# Patient Record
Sex: Male | Born: 1950 | Race: White | Hispanic: No | Smoking: Never smoker
Health system: Southern US, Community
[De-identification: ages and names within clinical notes are randomized; demographics above are authoritative.]

## PROBLEM LIST (undated history)

## (undated) DIAGNOSIS — E13610 Other specified diabetes mellitus with diabetic neuropathic arthropathy: Secondary | ICD-10-CM

## (undated) DIAGNOSIS — Z9889 Other specified postprocedural states: Secondary | ICD-10-CM

## (undated) DIAGNOSIS — E119 Type 2 diabetes mellitus without complications: Secondary | ICD-10-CM

## (undated) DIAGNOSIS — F419 Anxiety disorder, unspecified: Secondary | ICD-10-CM

## (undated) DIAGNOSIS — R112 Nausea with vomiting, unspecified: Secondary | ICD-10-CM

## (undated) DIAGNOSIS — R0602 Shortness of breath: Secondary | ICD-10-CM

## (undated) DIAGNOSIS — I739 Peripheral vascular disease, unspecified: Secondary | ICD-10-CM

## (undated) DIAGNOSIS — I10 Essential (primary) hypertension: Secondary | ICD-10-CM

## (undated) DIAGNOSIS — K219 Gastro-esophageal reflux disease without esophagitis: Secondary | ICD-10-CM

## (undated) DIAGNOSIS — E785 Hyperlipidemia, unspecified: Secondary | ICD-10-CM

## (undated) DIAGNOSIS — I219 Acute myocardial infarction, unspecified: Secondary | ICD-10-CM

## (undated) DIAGNOSIS — K3184 Gastroparesis: Secondary | ICD-10-CM

## (undated) DIAGNOSIS — M1460 Charcot's joint, unspecified site: Secondary | ICD-10-CM

## (undated) DIAGNOSIS — F329 Major depressive disorder, single episode, unspecified: Secondary | ICD-10-CM

## (undated) DIAGNOSIS — F32A Depression, unspecified: Secondary | ICD-10-CM

## (undated) DIAGNOSIS — M1990 Unspecified osteoarthritis, unspecified site: Secondary | ICD-10-CM

## (undated) DIAGNOSIS — Z8719 Personal history of other diseases of the digestive system: Secondary | ICD-10-CM

## (undated) DIAGNOSIS — I2699 Other pulmonary embolism without acute cor pulmonale: Secondary | ICD-10-CM

## (undated) DIAGNOSIS — I2510 Atherosclerotic heart disease of native coronary artery without angina pectoris: Secondary | ICD-10-CM

## (undated) DIAGNOSIS — G629 Polyneuropathy, unspecified: Secondary | ICD-10-CM

## (undated) DIAGNOSIS — G4733 Obstructive sleep apnea (adult) (pediatric): Secondary | ICD-10-CM

## (undated) HISTORY — PX: IM NAILING TIBIA: SUR734

## (undated) HISTORY — PX: TOE AMPUTATION: SHX809

## (undated) HISTORY — PX: I & D EXTREMITY: SHX5045

## (undated) HISTORY — PX: SHOULDER ARTHROSCOPY W/ ROTATOR CUFF REPAIR: SHX2400

## (undated) NOTE — *Deleted (*Deleted)
[facility] - Clinic Note  03/30/2020     CHIEF COMPLAINT Patient presents for No chief complaint on file.   HISTORY OF PRESENT ILLNESS: Samuel [name] is a 28 y.o. male who presents to the clinic today for:     Referring physician: [name], MD [address] [place],  [place] [address]  HISTORICAL INFORMATION:   Selected notes from the MEDICAL RECORD NUMBER Referred by Dr. Nedra Hai:  Ocular Hx- PMH-    CURRENT MEDICATIONS: No current outpatient medications on file. (Ophthalmic Drugs)   No current facility-administered medications for this visit. (Ophthalmic Drugs)   Current Outpatient Medications (Other)  Medication Sig  . alum & mag hydroxide-simeth (MAALOX/MYLANTA) 200-200-20 MG/5ML suspension Take 15 mLs by mouth every 6 (six) hours as needed for indigestion or heartburn. (Patient not taking: Reported on 01/16/2020)  . amLODipine (NORVASC) 10 MG tablet Take 1 tablet (10 mg total) by mouth daily.  [name] apixaban (ELIQUIS) 5 MG TABS tablet Take 5 mg by mouth 2 (two) times daily.  [name] aspirin 81 MG EC tablet Take 1 tablet (81 mg total) by mouth daily.  [name] atorvastatin (LIPITOR) 80 MG tablet Take 1 tablet (80 mg total) by mouth daily at 6 PM. (Patient taking differently: Take 80 mg by mouth daily. )  . clopidogrel (PLAVIX) 75 MG tablet Take 75 mg by mouth daily.  [name] docusate sodium (COLACE) 100 MG capsule Take 100 mg by mouth daily.  . furosemide (LASIX) 20 MG tablet Take 1 tablet (20 mg total) by mouth daily.  [name] gabapentin (NEURONTIN) 300 MG capsule Take 600 mg by mouth 2 (two) times daily.   . Insulin Glargine (BASAGLAR KWIKPEN) 100 UNIT/ML Inject 0.55 mLs (55 Units total) into the skin 2 (two) times daily.  . insulin lispro (HUMALOG) 100 UNIT/ML injection Inject 5-18 Units into the skin See admin instructions. units three times daily before meals Sliding Scale 150-250 = 5 units 251-350= 6-8 units 351-450= 10 units Over 450 18 units  . metFORMIN  (GLUCOPHAGE-XR) 500 MG 24 hr tablet Take 500 mg by mouth daily.  . metoprolol tartrate (LOPRESSOR) 25 MG tablet Take 1 tablet (25 mg total) by mouth 2 (two) times daily.  . nitroGLYCERIN (NITROSTAT) 0.4 MG SL tablet Place 1 tablet (0.4 mg total) under the tongue every 5 (five) minutes as needed for chest pain.  Letta Pate VERIO test strip Use to test blood sugar twice daily  . pantoprazole (PROTONIX) 40 MG tablet Take 1 tablet (40 mg total) by mouth 2 (two) times daily before a meal.  . promethazine (PHENERGAN) 25 MG tablet Take 25-50 mg by mouth every 6 (six) hours as needed for nausea or vomiting.   . ranolazine (RANEXA) 500 MG 12 hr tablet Take 1 tablet (500 mg total) by mouth 2 (two) times daily.  . traMADol (ULTRAM) 50 MG tablet Take 100 mg by mouth every 6 (six) hours as needed for moderate pain.    No current facility-administered medications for this visit. (Other)      REVIEW OF SYSTEMS:    ALLERGIES Allergies  Allergen Reactions  . Gabapentin Hives, Itching, Nausea And Vomiting and Rash    REACTION: rash/hives (per patient, was a reaction to an inactive ingredient in another mgf brand). The patient stated that he does take this medication now and it doesn't cause a rash any more.  . Relafen [Nabumetone] Nausea And Vomiting and Rash  . Codeine Nausea And Vomiting and Rash  PAST MEDICAL HISTORY Past Medical History:  Diagnosis Date  . Anxiety   . Arthritis    "all over"   . CAD (coronary artery disease)   . Charcot's joint    "left foot"  . 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 Depression   . Gastroparesis   . GERD (gastroesophageal reflux disease)   . H/O hiatal hernia   . Hyperlipidemia   . Hypertension   . Myocardial infarction (HCC) 2017/03/27   around this date  . OSA (obstructive sleep apnea)    "not bad enough for a mask"  . Peripheral neuropathy   . Peripheral vascular disease (HCC)   . PONV (postoperative nausea and vomiting)    . Pulmonary embolism (HCC)    hx. of 2012  . Shortness of breath    exertion  . Type II diabetes mellitus (HCC)    Past Surgical History:  Procedure Laterality Date  . AMPUTATION Left 03/08/2015   Procedure:  LEFT AMPUTATION BELOW KNEE;  Surgeon: Toni Arthurs, MD;  Location: [facility];  Service: Orthopedics;  Laterality: Left;  . APPLICATION OF WOUND VAC Left 01/07/2014  . CHOLECYSTECTOMY  06/2010  . CORONARY ANGIOPLASTY WITH STENT PLACEMENT  05/2009   "1"  . FOOT SURGERY Left 2010   "for Charcot's joint"  . HARDWARE REMOVAL Left 01/07/2014   Procedure: LEFT LEG REMOVAL OF DEEP IMPLANT AND SEQUESTRECTOMY; APPLICATION OF WOUND VAC ;  Surgeon: Toni Arthurs, MD;  Location: [facility] OR;  Service: Orthopedics;  Laterality: Left;  . I & D EXTREMITY Left "multiple"   leg  . 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 IM NAILING TIBIA Left ~ 2012  . LEFT HEART CATH AND CORONARY ANGIOGRAPHY N/A 04/08/2017   Procedure: LEFT HEART CATH AND CORONARY ANGIOGRAPHY;  Surgeon: Runell Gess, MD;  Location: [facility] INVASIVE CV LAB;  Service: Cardiovascular;  Laterality: N/A;  . SHOULDER ARTHROSCOPY W/ ROTATOR CUFF REPAIR Left    "and bone spurs"  . TIBIAL IM ROD REMOVAL Left 01/07/2014  . TOE AMPUTATION Right ~ 2011   "great toe"  . VENA CAVA FILTER PLACEMENT  2012  . VENA CAVA FILTER PLACEMENT N/A 11/20/2016   Procedure: INSERTION VENA-CAVA FILTER;  Surgeon: Sherren Kerns, MD;  Location: [facility];  Service: Vascular;  Laterality: N/A;  . WOUND DEBRIDEMENT Left 01/07/2014   "tibia"    FAMILY HISTORY Family History  Problem Relation Age of Onset  . COPD Mother   . Heart disease Mother   . Lung cancer Mother   . Retinal detachment Father   . Alcoholism Brother   . Heart disease Brother   . COPD Brother   . Diabetes Brother   . Hypertension Brother   . Stroke Brother      SOCIAL HISTORY Social History   Tobacco Use  . Smoking status: Never Smoker  . Smokeless tobacco: Never Used  Vaping Use  . Vaping Use: Never used  Substance Use Topics  . Alcohol use: No    Comment: 01/07/2014 'might have a wine cooler a couple times/yr"  . Drug use: No         OPHTHALMIC EXAM: Not recorded     IMAGING AND PROCEDURES  Imaging and Procedures for 03/30/2020           ASSESSMENT/PLAN:  No diagnosis found.  1.  2.  3.  Ophthalmic Meds Ordered this visit:  No orders of the defined types were placed in this encounter.      No follow-ups on file.  There are no Patient Instructions on file for this visit.   Explained the diagnoses, plan, and follow up with the patient and they expressed understanding.  Patient expressed understanding of the importance of proper follow up care.   This document serves as a record of services personally performed by Karie Chimera, MD, PhD. It was created on their behalf by Annalee Genta, COMT. The creation of this record is the provider's dictation and/or activities during the visit.  Electronically signed by: Annalee Genta, COMT 03/29/20 1:16 PM    Karie Chimera, M.D., Ph.D. Diseases & Surgery of the Retina and Vitreous [facility] @TODAY @     Abbreviations: M myopia (nearsighted); A astigmatism; H hyperopia (farsighted); P presbyopia; Mrx spectacle prescription;  CTL contact lenses; OD right eye; OS left eye; OU both eyes  XT exotropia; ET esotropia; PEK punctate epithelial keratitis; PEE punctate epithelial erosions; DES dry eye syndrome; MGD meibomian gland dysfunction; ATs artificial tears; PFAT's preservative free artificial tears; NSC nuclear sclerotic cataract; PSC posterior subcapsular cataract; ERM epi-retinal membrane; PVD posterior vitreous detachment; RD retinal detachment; DM diabetes mellitus; DR diabetic retinopathy; NPDR non-proliferative diabetic retinopathy;  PDR proliferative diabetic retinopathy; CSME clinically significant macular edema; DME diabetic macular edema; dbh dot blot hemorrhages; CWS cotton wool spot; POAG primary open angle glaucoma; C/D cup-to-disc ratio; HVF humphrey visual field; GVF goldmann visual field; OCT optical coherence tomography; IOP intraocular pressure; BRVO Branch retinal vein occlusion; CRVO central retinal vein occlusion; CRAO central retinal artery occlusion; BRAO branch retinal artery occlusion; RT retinal tear; SB scleral buckle; PPV pars plana vitrectomy; VH Vitreous hemorrhage; PRP panretinal laser photocoagulation; IVK intravitreal kenalog; VMT vitreomacular traction; MH Macular hole;  NVD neovascularization of the disc; NVE neovascularization elsewhere; AREDS age related eye disease study; ARMD age related macular degeneration; POAG primary open angle glaucoma; EBMD epithelial/anterior basement membrane dystrophy; ACIOL anterior chamber intraocular lens; IOL intraocular lens; PCIOL posterior chamber intraocular lens; Phaco/IOL phacoemulsification with intraocular lens placement; PRK photorefractive keratectomy; LASIK laser assisted in situ keratomileusis; HTN hypertension; DM diabetes mellitus; COPD chronic obstructive pulmonary disease

---

## 1998-03-07 ENCOUNTER — Ambulatory Visit (HOSPITAL_COMMUNITY): Admission: RE | Admit: 1998-03-07 | Discharge: 1998-03-07 | Payer: Self-pay | Admitting: Gastroenterology

## 1999-12-01 ENCOUNTER — Encounter: Payer: Self-pay | Admitting: Gastroenterology

## 1999-12-01 ENCOUNTER — Encounter: Admission: RE | Admit: 1999-12-01 | Discharge: 1999-12-01 | Payer: Self-pay | Admitting: Gastroenterology

## 2000-01-10 ENCOUNTER — Emergency Department (HOSPITAL_COMMUNITY): Admission: EM | Admit: 2000-01-10 | Discharge: 2000-01-10 | Payer: Self-pay | Admitting: Emergency Medicine

## 2000-05-22 ENCOUNTER — Emergency Department (HOSPITAL_COMMUNITY): Admission: EM | Admit: 2000-05-22 | Discharge: 2000-05-22 | Payer: Self-pay | Admitting: Emergency Medicine

## 2000-05-23 ENCOUNTER — Encounter: Payer: Self-pay | Admitting: Emergency Medicine

## 2001-01-24 ENCOUNTER — Ambulatory Visit (HOSPITAL_BASED_OUTPATIENT_CLINIC_OR_DEPARTMENT_OTHER): Admission: RE | Admit: 2001-01-24 | Discharge: 2001-01-24 | Payer: Self-pay | Admitting: Internal Medicine

## 2001-03-28 ENCOUNTER — Ambulatory Visit: Admission: RE | Admit: 2001-03-28 | Discharge: 2001-03-28 | Payer: Self-pay | Admitting: Internal Medicine

## 2002-06-16 ENCOUNTER — Encounter: Payer: Self-pay | Admitting: Cardiovascular Disease

## 2002-06-16 ENCOUNTER — Inpatient Hospital Stay (HOSPITAL_COMMUNITY): Admission: EM | Admit: 2002-06-16 | Discharge: 2002-06-17 | Payer: Self-pay | Admitting: Emergency Medicine

## 2006-07-02 ENCOUNTER — Encounter: Admission: RE | Admit: 2006-07-02 | Discharge: 2006-07-05 | Payer: Self-pay | Admitting: Sports Medicine

## 2006-08-01 ENCOUNTER — Encounter: Admission: RE | Admit: 2006-08-01 | Discharge: 2006-08-01 | Payer: Self-pay | Admitting: Sports Medicine

## 2006-09-03 ENCOUNTER — Encounter: Admission: RE | Admit: 2006-09-03 | Discharge: 2006-09-03 | Payer: Self-pay | Admitting: Specialist

## 2006-10-09 ENCOUNTER — Encounter: Admission: RE | Admit: 2006-10-09 | Discharge: 2006-11-04 | Payer: Self-pay | Admitting: Specialist

## 2006-11-05 ENCOUNTER — Encounter: Admission: RE | Admit: 2006-11-05 | Discharge: 2006-11-20 | Payer: Self-pay | Admitting: Specialist

## 2006-11-21 ENCOUNTER — Encounter: Admission: RE | Admit: 2006-11-21 | Discharge: 2007-02-19 | Payer: Self-pay | Admitting: Specialist

## 2006-12-31 ENCOUNTER — Observation Stay (HOSPITAL_COMMUNITY): Admission: RE | Admit: 2006-12-31 | Discharge: 2007-01-01 | Payer: Self-pay | Admitting: Specialist

## 2006-12-31 ENCOUNTER — Other Ambulatory Visit: Payer: Self-pay | Admitting: Specialist

## 2007-02-11 ENCOUNTER — Ambulatory Visit: Payer: Self-pay | Admitting: Cardiology

## 2007-02-20 ENCOUNTER — Encounter: Admission: RE | Admit: 2007-02-20 | Discharge: 2007-04-07 | Payer: Self-pay | Admitting: Specialist

## 2008-04-30 ENCOUNTER — Ambulatory Visit: Payer: Self-pay | Admitting: Internal Medicine

## 2008-04-30 DIAGNOSIS — J301 Allergic rhinitis due to pollen: Secondary | ICD-10-CM

## 2008-04-30 DIAGNOSIS — G4733 Obstructive sleep apnea (adult) (pediatric): Secondary | ICD-10-CM

## 2008-05-14 DIAGNOSIS — E109 Type 1 diabetes mellitus without complications: Secondary | ICD-10-CM | POA: Insufficient documentation

## 2008-05-26 ENCOUNTER — Ambulatory Visit: Payer: Self-pay | Admitting: Internal Medicine

## 2008-06-18 ENCOUNTER — Encounter: Payer: Self-pay | Admitting: Internal Medicine

## 2008-06-18 HISTORY — PX: FOOT SURGERY: SHX648

## 2008-08-24 ENCOUNTER — Emergency Department (HOSPITAL_COMMUNITY): Admission: EM | Admit: 2008-08-24 | Discharge: 2008-08-25 | Payer: Self-pay | Admitting: Emergency Medicine

## 2008-10-15 ENCOUNTER — Inpatient Hospital Stay (HOSPITAL_COMMUNITY): Admission: EM | Admit: 2008-10-15 | Discharge: 2008-10-22 | Payer: Self-pay | Admitting: Emergency Medicine

## 2008-10-26 ENCOUNTER — Observation Stay (HOSPITAL_COMMUNITY): Admission: RE | Admit: 2008-10-26 | Discharge: 2008-10-27 | Payer: Self-pay | Admitting: Orthopedic Surgery

## 2008-10-26 ENCOUNTER — Encounter (INDEPENDENT_AMBULATORY_CARE_PROVIDER_SITE_OTHER): Payer: Self-pay | Admitting: Orthopedic Surgery

## 2009-01-05 ENCOUNTER — Encounter: Admission: RE | Admit: 2009-01-05 | Discharge: 2009-01-05 | Payer: Self-pay | Admitting: Specialist

## 2009-01-31 ENCOUNTER — Emergency Department (HOSPITAL_COMMUNITY): Admission: EM | Admit: 2009-01-31 | Discharge: 2009-01-31 | Payer: Self-pay | Admitting: Emergency Medicine

## 2009-02-22 ENCOUNTER — Encounter: Payer: Self-pay | Admitting: Internal Medicine

## 2009-04-12 ENCOUNTER — Emergency Department (HOSPITAL_COMMUNITY): Admission: EM | Admit: 2009-04-12 | Discharge: 2009-04-13 | Payer: Self-pay | Admitting: Emergency Medicine

## 2009-05-01 DIAGNOSIS — I2510 Atherosclerotic heart disease of native coronary artery without angina pectoris: Secondary | ICD-10-CM | POA: Insufficient documentation

## 2009-05-18 HISTORY — PX: CORONARY ANGIOPLASTY WITH STENT PLACEMENT: SHX49

## 2009-05-26 ENCOUNTER — Telehealth: Payer: Self-pay | Admitting: Internal Medicine

## 2009-06-08 ENCOUNTER — Encounter: Admission: RE | Admit: 2009-06-08 | Discharge: 2009-06-08 | Payer: Self-pay | Admitting: Cardiovascular Disease

## 2009-06-14 ENCOUNTER — Inpatient Hospital Stay (HOSPITAL_COMMUNITY): Admission: RE | Admit: 2009-06-14 | Discharge: 2009-06-15 | Payer: Self-pay | Admitting: Cardiovascular Disease

## 2009-08-13 ENCOUNTER — Emergency Department (HOSPITAL_COMMUNITY): Admission: EM | Admit: 2009-08-13 | Discharge: 2009-08-14 | Payer: Self-pay | Admitting: Emergency Medicine

## 2009-08-14 ENCOUNTER — Emergency Department (HOSPITAL_COMMUNITY): Admission: EM | Admit: 2009-08-14 | Discharge: 2009-08-14 | Payer: Self-pay | Admitting: Emergency Medicine

## 2009-09-07 ENCOUNTER — Encounter: Payer: Self-pay | Admitting: Internal Medicine

## 2009-10-15 ENCOUNTER — Inpatient Hospital Stay (HOSPITAL_COMMUNITY): Admission: EM | Admit: 2009-10-15 | Discharge: 2009-10-17 | Payer: Self-pay | Admitting: Emergency Medicine

## 2009-10-18 ENCOUNTER — Inpatient Hospital Stay (HOSPITAL_COMMUNITY): Admission: AD | Admit: 2009-10-18 | Discharge: 2009-10-26 | Payer: Self-pay | Admitting: Internal Medicine

## 2009-10-27 ENCOUNTER — Inpatient Hospital Stay (HOSPITAL_COMMUNITY): Admission: EM | Admit: 2009-10-27 | Discharge: 2009-10-30 | Payer: Self-pay | Admitting: Emergency Medicine

## 2009-11-08 ENCOUNTER — Encounter: Payer: Self-pay | Admitting: Internal Medicine

## 2009-11-09 DIAGNOSIS — K219 Gastro-esophageal reflux disease without esophagitis: Secondary | ICD-10-CM

## 2009-11-29 ENCOUNTER — Inpatient Hospital Stay (HOSPITAL_COMMUNITY): Admission: RE | Admit: 2009-11-29 | Discharge: 2009-12-05 | Payer: Self-pay | Admitting: Orthopedic Surgery

## 2009-12-21 ENCOUNTER — Inpatient Hospital Stay (HOSPITAL_COMMUNITY): Admission: EM | Admit: 2009-12-21 | Discharge: 2009-12-23 | Payer: Self-pay | Admitting: Emergency Medicine

## 2009-12-25 ENCOUNTER — Inpatient Hospital Stay (HOSPITAL_COMMUNITY): Admission: EM | Admit: 2009-12-25 | Discharge: 2010-01-01 | Payer: Self-pay | Admitting: Emergency Medicine

## 2009-12-28 ENCOUNTER — Ambulatory Visit: Payer: Self-pay | Admitting: Infectious Diseases

## 2010-01-02 ENCOUNTER — Observation Stay (HOSPITAL_COMMUNITY)
Admission: EM | Admit: 2010-01-02 | Discharge: 2010-01-05 | Payer: Self-pay | Source: Home / Self Care | Admitting: Emergency Medicine

## 2010-01-14 ENCOUNTER — Emergency Department (HOSPITAL_COMMUNITY): Admission: EM | Admit: 2010-01-14 | Discharge: 2010-01-14 | Payer: Self-pay | Admitting: Emergency Medicine

## 2010-01-23 ENCOUNTER — Encounter: Payer: Self-pay | Admitting: Infectious Disease

## 2010-01-25 ENCOUNTER — Ambulatory Visit: Payer: Self-pay | Admitting: Infectious Disease

## 2010-01-25 DIAGNOSIS — R112 Nausea with vomiting, unspecified: Secondary | ICD-10-CM | POA: Insufficient documentation

## 2010-01-25 DIAGNOSIS — A4101 Sepsis due to Methicillin susceptible Staphylococcus aureus: Secondary | ICD-10-CM

## 2010-01-25 DIAGNOSIS — M86179 Other acute osteomyelitis, unspecified ankle and foot: Secondary | ICD-10-CM | POA: Insufficient documentation

## 2010-01-30 ENCOUNTER — Encounter: Payer: Self-pay | Admitting: Infectious Disease

## 2010-03-01 ENCOUNTER — Ambulatory Visit: Payer: Self-pay | Admitting: Infectious Disease

## 2010-03-01 DIAGNOSIS — L97909 Non-pressure chronic ulcer of unspecified part of unspecified lower leg with unspecified severity: Secondary | ICD-10-CM | POA: Insufficient documentation

## 2010-03-03 ENCOUNTER — Ambulatory Visit (HOSPITAL_BASED_OUTPATIENT_CLINIC_OR_DEPARTMENT_OTHER): Admission: RE | Admit: 2010-03-03 | Discharge: 2010-03-03 | Payer: Self-pay | Admitting: Orthopedic Surgery

## 2010-03-20 ENCOUNTER — Encounter: Payer: Self-pay | Admitting: Infectious Diseases

## 2010-03-28 ENCOUNTER — Ambulatory Visit: Payer: Self-pay | Admitting: Infectious Disease

## 2010-05-25 ENCOUNTER — Emergency Department (HOSPITAL_COMMUNITY): Admission: EM | Admit: 2010-05-25 | Discharge: 2010-01-07 | Payer: Self-pay | Admitting: Emergency Medicine

## 2010-06-15 ENCOUNTER — Inpatient Hospital Stay (HOSPITAL_COMMUNITY)
Admission: EM | Admit: 2010-06-15 | Discharge: 2010-06-17 | Payer: Self-pay | Source: Home / Self Care | Attending: Internal Medicine | Admitting: Internal Medicine

## 2010-06-18 HISTORY — PX: VENA CAVA FILTER PLACEMENT: SUR1032

## 2010-06-18 HISTORY — PX: CHOLECYSTECTOMY: SHX55

## 2010-06-30 ENCOUNTER — Inpatient Hospital Stay (HOSPITAL_COMMUNITY)
Admission: EM | Admit: 2010-06-30 | Discharge: 2010-07-03 | Payer: Self-pay | Source: Home / Self Care | Attending: Internal Medicine | Admitting: Internal Medicine

## 2010-07-03 LAB — COMPREHENSIVE METABOLIC PANEL
ALT: 18 U/L (ref 0–53)
AST: 20 U/L (ref 0–37)
Albumin: 3.2 g/dL — ABNORMAL LOW (ref 3.5–5.2)
Alkaline Phosphatase: 67 U/L (ref 39–117)
BUN: 15 mg/dL (ref 6–23)
CO2: 29 mEq/L (ref 19–32)
Calcium: 9.1 mg/dL (ref 8.4–10.5)
Chloride: 104 mEq/L (ref 96–112)
Creatinine, Ser: 1.1 mg/dL (ref 0.4–1.5)
GFR calc Af Amer: >60 mL/min (ref >60)
GFR calc non Af Amer: >60 mL/min (ref >60)
Glucose, Bld: 114 mg/dL — ABNORMAL HIGH (ref 70–99)
Potassium: 3.4 mEq/L — ABNORMAL LOW (ref 3.5–5.1)
Sodium: 141 mEq/L (ref 135–145)
Total Bilirubin: 0.9 mg/dL (ref 0.3–1.2)
Total Protein: 7 g/dL (ref 6.0–8.3)

## 2010-07-03 LAB — POCT I-STAT, CHEM 8
BUN: 17 mg/dL (ref 6–23)
Calcium, Ion: 1.07 mmol/L — ABNORMAL LOW (ref 1.12–1.32)
Chloride: 103 mEq/L (ref 96–112)
Creatinine, Ser: 1 mg/dL (ref 0.4–1.5)
Glucose, Bld: 180 mg/dL — ABNORMAL HIGH (ref 70–99)
HCT: 42 % (ref 39.0–52.0)
Hemoglobin: 14.3 g/dL (ref 13.0–17.0)
Potassium: 4 mEq/L (ref 3.5–5.1)
Sodium: 139 mEq/L (ref 135–145)
TCO2: 26 mmol/L (ref 0–100)

## 2010-07-03 LAB — CBC
HCT: 39.7 % (ref 39.0–52.0)
HCT: 36 % — ABNORMAL LOW (ref 39.0–52.0)
Hemoglobin: 13.1 g/dL (ref 13.0–17.0)
Hemoglobin: 11.9 g/dL — ABNORMAL LOW (ref 13.0–17.0)
MCH: 28.4 pg (ref 26.0–34.0)
MCH: 28.3 pg (ref 26.0–34.0)
MCHC: 33 g/dL (ref 30.0–36.0)
MCHC: 33.1 g/dL (ref 30.0–36.0)
MCV: 85.9 fL (ref 78.0–100.0)
MCV: 85.7 fL (ref 78.0–100.0)
Platelets: 217 10*3/uL (ref 150–400)
Platelets: 209 10*3/uL (ref 150–400)
RBC: 4.62 MIL/uL (ref 4.22–5.81)
RBC: 4.2 MIL/uL — ABNORMAL LOW (ref 4.22–5.81)
RDW: 15.8 % — ABNORMAL HIGH (ref 11.5–15.5)
RDW: 15.8 % — ABNORMAL HIGH (ref 11.5–15.5)
WBC: 7.1 10*3/uL (ref 4.0–10.5)
WBC: 7 10*3/uL (ref 4.0–10.5)

## 2010-07-03 LAB — BASIC METABOLIC PANEL
BUN: 14 mg/dL (ref 6–23)
CO2: 28 mEq/L (ref 19–32)
Calcium: 9.1 mg/dL (ref 8.4–10.5)
Chloride: 108 mEq/L (ref 96–112)
Creatinine, Ser: 1.11 mg/dL (ref 0.4–1.5)
GFR calc Af Amer: >60 mL/min (ref >60)
GFR calc non Af Amer: >60 mL/min (ref >60)
Glucose, Bld: 77 mg/dL (ref 70–99)
Potassium: 3.9 mEq/L (ref 3.5–5.1)
Sodium: 143 mEq/L (ref 135–145)

## 2010-07-03 LAB — POCT CARDIAC MARKERS
CKMB, poc: <1 ng/mL — ABNORMAL LOW (ref 1.0–8.0)
Myoglobin, poc: 138 ng/mL (ref 12–200)
Troponin i, poc: <0.05 ng/mL (ref 0.00–0.09)

## 2010-07-03 LAB — GLUCOSE, CAPILLARY
Glucose-Capillary: 146 mg/dL — ABNORMAL HIGH (ref 70–99)
Glucose-Capillary: 142 mg/dL — ABNORMAL HIGH (ref 70–99)
Glucose-Capillary: 83 mg/dL (ref 70–99)
Glucose-Capillary: 72 mg/dL (ref 70–99)
Glucose-Capillary: 63 mg/dL — ABNORMAL LOW (ref 70–99)
Glucose-Capillary: 67 mg/dL — ABNORMAL LOW (ref 70–99)
Glucose-Capillary: 79 mg/dL (ref 70–99)
Glucose-Capillary: 139 mg/dL — ABNORMAL HIGH (ref 70–99)
Glucose-Capillary: 79 mg/dL (ref 70–99)
Glucose-Capillary: 139 mg/dL — ABNORMAL HIGH (ref 70–99)
Glucose-Capillary: 113 mg/dL — ABNORMAL HIGH (ref 70–99)
Glucose-Capillary: 151 mg/dL — ABNORMAL HIGH (ref 70–99)

## 2010-07-03 LAB — URINALYSIS, ROUTINE W REFLEX MICROSCOPIC
Hgb urine dipstick: NEGATIVE
Ketones, ur: 40 mg/dL — AB
Leukocytes, UA: NEGATIVE
Nitrite: NEGATIVE
Protein, ur: 30 mg/dL — AB
Specific Gravity, Urine: 1.028 (ref 1.005–1.030)
Urine Glucose, Fasting: 250 mg/dL — AB
Urobilinogen, UA: 1 mg/dL (ref 0.0–1.0)
pH: 6.5 (ref 5.0–8.0)

## 2010-07-03 LAB — BRAIN NATRIURETIC PEPTIDE: Pro B Natriuretic peptide (BNP): <30 pg/mL (ref 0.0–100.0)

## 2010-07-03 LAB — HEMOGLOBIN A1C
Hgb A1c MFr Bld: 6.7 % — ABNORMAL HIGH (ref <5.7)
Mean Plasma Glucose: 146 mg/dL — ABNORMAL HIGH (ref <117)

## 2010-07-03 LAB — HEPATIC FUNCTION PANEL
ALT: 23 U/L (ref 0–53)
AST: 25 U/L (ref 0–37)
Albumin: 3.5 g/dL (ref 3.5–5.2)
Alkaline Phosphatase: 76 U/L (ref 39–117)
Bilirubin, Direct: 0.2 mg/dL (ref 0.0–0.3)
Indirect Bilirubin: 0.9 mg/dL (ref 0.3–0.9)
Total Bilirubin: 1.1 mg/dL (ref 0.3–1.2)
Total Protein: 7.7 g/dL (ref 6.0–8.3)

## 2010-07-03 LAB — URINE CULTURE
Colony Count: 15000
Culture  Setup Time: 201201131740

## 2010-07-03 LAB — DIFFERENTIAL
Basophils Absolute: 0 10*3/uL (ref 0.0–0.1)
Basophils Relative: 0 % (ref 0–1)
Eosinophils Absolute: 0 10*3/uL (ref 0.0–0.7)
Eosinophils Relative: 0 % (ref 0–5)
Lymphocytes Relative: 10 % — ABNORMAL LOW (ref 12–46)
Lymphs Abs: 0.7 10*3/uL (ref 0.7–4.0)
Monocytes Absolute: 0.3 10*3/uL (ref 0.1–1.0)
Monocytes Relative: 4 % (ref 3–12)
Neutro Abs: 6.1 10*3/uL (ref 1.7–7.7)
Neutrophils Relative %: 86 % — ABNORMAL HIGH (ref 43–77)

## 2010-07-03 LAB — URINE MICROSCOPIC-ADD ON

## 2010-07-03 LAB — LIPASE, BLOOD: Lipase: 16 U/L (ref 11–59)

## 2010-07-05 ENCOUNTER — Inpatient Hospital Stay (HOSPITAL_COMMUNITY)
Admission: EM | Admit: 2010-07-05 | Discharge: 2010-07-14 | Disposition: A | Discharge status: Still In Hospital | Payer: Self-pay | Source: Home / Self Care

## 2010-07-05 LAB — GLUCOSE, CAPILLARY
Glucose-Capillary: 99 mg/dL (ref 70–99)
Glucose-Capillary: 107 mg/dL — ABNORMAL HIGH (ref 70–99)

## 2010-07-06 ENCOUNTER — Encounter (INDEPENDENT_AMBULATORY_CARE_PROVIDER_SITE_OTHER): Payer: Self-pay | Admitting: General Surgery

## 2010-07-07 NOTE — Discharge Summary (Signed)
NAMEAUSTAN, Samuel [name] NO.:  [id number]  MEDICAL RECORD NO.:  [id number]          PATIENT TYPE:  INP  LOCATION:  1302                         FACILITY:  [facility]  PHYSICIAN:  Ruthy Dick, MD    DATE OF BIRTH:  [birth year]  DATE OF ADMISSION:  06/30/2010 DATE OF DISCHARGE:                              DISCHARGE SUMMARY   PRIMARY CARE PHYSICIAN:  Massie Maroon, MD  REASON FOR ADMISSION:  Nausea and vomiting.  FINAL DISCHARGE DIAGNOSES: 1. Nausea and vomiting likely due to gastroparesis. 2. Diabetes mellitus type 2, insulin dependent. 3. Peripheral neuropathy. 4. Coronary artery disease with drug-eluting stent in 2010, December. 5. Hypertension. 6. Left lower extremity Charcot joint repair. 7. Right great toe amputation. 8. Depression. 9. Gastroesophageal reflux disease. 10.Hyperlipidemia. 11.Obstructive sleep apnea. 12.Mild dehydration, resolved.  PROCEDURES DONE DURING THIS ADMISSION:  This included: 1. Ultrasound of the abdomen which was read as having no evidence of     acute abnormalities. 2. Abdominal series was done, also showed no acute abnormalities.  BRIEF HISTORY OF PRESENT ILLNESS AND HOSPITAL COURSE:  This is a 59-year gentleman with type 2 diabetes mellitus and dependent on insulin who presented with nausea, vomiting.  He did not have any abdominal pain. The patient has had 2 endoscopies in May 2011 that showed no definitive cause for his vomiting.  It is presumed that his vomiting is due to gastroparesis even though his hemoglobin A1c seems to be fairly normal in the 7s.  He was placed on IV Reglan and some Zofran which resolved his symptoms.  He has gone for a gastric emptying scan today which if it is normal, he will be stable enough to be discharged home today.  For the past few days, he has been able to eat and keep all his meals.  DISCHARGE PHYSICAL EXAMINATION:  VITAL SIGNS:  His vitals are temperature 97.4, pulse 67, respirations  18, blood pressure 150/79, saturating 94% on room air.  CHEST:  Clear to auscultation bilaterally. ABDOMEN: Soft, nontender.  EXTREMITIES:  No clubbing, no cyanosis, no edema.  CARDIOVASCULAR:  First and second heart sounds only.  CENTRAL NERVOUS SYSTEM:  Nonfocal.  DISCHARGE MEDICATIONS: 1. Insulin Lantus 22 units subcu daily and 36 units subcu q.h.s. 2. Actos 30 mg subcu daily. 3. Aspirin 81 mg daily. 4. Lyrica 300 mg t.i.d. 5. Metoprolol XL 25 mg daily. 6. Multivitamins 1 tablet daily. 7. Zofran 4 mg 1 tablet q.8 h. p.r.n. for nausea and vomiting 8. Prevacid 1 tablet b.i.d. 9. Promethazine 25 mg q.8 h. p.r.n. for nausea and vomiting 10.Reglan 10 mg 30 minutes before meals and at bedtime (q.i.d.). 11.Simvastatin 40 mg q.h.s. 12.Prasugrel 10 mg daily. 13.Lunesta 20 mg q.h.s. 14.Hydrocodone/APAP 5/325 mg q.6 h. p.r.n. for pain. 15.Colace 100 mg p.o. daily. 16.Quinapril 5 mg daily. 17.Iron 65 mg p.o. daily. 18.Benfotiamine 2 tablets b.i.d. for nerve.  FOLLOWUP:  He is to follow up with his primary care physician Dr. [name] in 1 to 2 weeks, and he is to call for this appointment.  Again depending on the results of the gastric emptying scan, the  patient may be able to go home today.  Total time used for discharging this patient is 45 minutes.  Plan of care discussed in details with the patient at length.  He is in full agreement and plans to comply.     Ruthy Dick, MD     GU/MEDQ  D:  07/03/2010  T:  07/03/2010  Job:  [id number]  cc:   Massie Maroon, MD Fax: [phone]  Electronically Signed by Ruthy Dick  on 07/06/2010 06:55:54 PM

## 2010-07-07 NOTE — H&P (Signed)
NAME:  Samuel [name], Samuel [name] NO.:  [id number]  MEDICAL RECORD NO.:  [id number]          PATIENT TYPE:  EMS  LOCATION:  ED                           FACILITY:  [facility]  PHYSICIAN:  Vania Rea, M.D. DATE OF BIRTH:  01-11-1951  DATE OF ADMISSION:  07/05/2010 DATE OF DISCHARGE:                             HISTORY & PHYSICAL   REQUESTING PHYSICIAN:  April Palumbo-Rasch, M.D.  CONSULTING PHYSICIAN:  Vania Rea, M.D.  REASON FOR CONSULTATION:  Abdominal pain.  HISTORY OF PRESENT ILLNESS:  This is a 60 year old Caucasian gentleman, well-known to the hospitalist service with multiple medical problems including diabetic gastroparesis with multiple admissions for nausea and vomiting who was discharged from the hospitalist service 2 days ago after treatment and stabilization for recurrent gastroparesis, nausea and vomiting who reports that since 9:30 last night he has been having a new and different type of pain consistent of a right sided abdomen and flank pain, steady 10 out to 10, nonradiating, pain not associated with vomiting.  The patient reports because the pain was so severe even though he has an appointment with his orthopedic surgeon today to look about an abscess on his left leg he decided to come to the emergency room instead. In the emergency room the patient was seen and evaluated and the hospitalist service was called to assist with management.  The patient denies fever, chest pain, cough or cold.  He denies any diarrhea or bloody stool as noted above.  He has not been vomiting.  PAST MEDICAL HISTORY: 1. Diabetes type 2. 2. Gastroparesis. 3. Peripheral neuropathy. 4. Coronary artery disease status post drug-eluting stent in December     2010. 5. Hypertension. 6. Left lower extremity Charcot joint. 7. Right great toe amputation. 8. Left leg cellulitis. 9. Depression. 10.Gastroesophageal reflux disease. 11.Hyperlipidemia. 12.Obstructive sleep  apnea.  MEDICATIONS AT HIS RECENT DISCHARGE:  Include: 1. Lantus 22 units each morning and 36 units at bedtime. 2. Actos 30 mg daily. 3. Aspirin 81 mg daily. 4. Lyrica 300 mg three times daily. 5. Toprol-XL 25 mg daily. 6. Multivitamins daily. 7. Zofran 1 mg every 8 hours p.r.n. and nausea. 8. Prevacid 1 tablet twice daily. 9. Phenergan 25 mg every 8 hours p.r.n. for nausea and vomiting. 10.Reglan 10 mg half an hour before meals and at bedtime. 11.Simvastatin 40 mg at bedtime. 12.Prasugrel 10 mg daily. 13.Lunesta 20 mg at bedtime. 14.Vicodin 5/325 every 6 hours p.r.n. for pain. 15.Colace 100 mg daily. 16.Quinapril 5 mg daily. 17.Iron 65 mg daily. 18.Benfotiamine 2 mg twice daily.  ALLERGIES: 1. CODEINE, WHICH CAUSES HIVES, NAUSEA AND VOMITING. 2. NEURONTIN, WHICH CAUSES HIVES AND VOMITING. 3. UNKNOWN REACTION TO RELAFEN.  FAMILY HISTORY AND SOCIAL HISTORY:  Was reviewed and is unchanged from his recent admission history and physical 1 week ago.  REVIEW OF SYSTEMS:  Other than noted above unremarkable.  PHYSICAL EXAM:  GENERAL:  Obese middle-aged African American gentleman reclining in the stretcher, appears to be in quite some painful distress. VITALS:  Temperature is 97.8, pulse 98, respirations 20, blood pressure 150/75.  He is saturating 98% on room air. HEENT:  His pupils are round and equal.  Mucous membranes pink, dry, anicteric.  No cervical lymphadenopathy does have a thick neck.  No thyromegaly appreciated. CHEST: Clear to auscultation bilaterally. CARDIOVASCULAR SYSTEM:  Regular rhythm.  No murmurs. ABDOMEN:  Obese.  He is tender in the right lower quadrant and also in the right flank posteriorly.  No masses are felt. EXTREMITIES:  He has trace edema on the left with multiple healed abrasions on the left leg and an erythematous area over the mid left leg with underlying fluctuance suggesting abscess.  He has arthritic deformity of the ankles. CENTRAL NERVOUS  SYSTEM:  Cranial nerves II-XII are grossly intact.  He has no focal lateralizing signs. SKIN:  Other than the lesions on his left leg, there is no erythema or rash on his flank to suggest Zoster.  LABORATORY:  His white count is 50, hemoglobin 13.3 is platelets were 196.  He has 87% neutrophils.  Absolute neutrophil count 13.1.  His sodium is 138, potassium 4.0, chloride 104, CO2 25, glucose 215, BUN 23, creatinine 1.1. His liver functions are otherwise unremarkable.  His lipase is normal at 14.  His lactic acid is normal at 1.4. His urinalysis is pending.  RADIOLOGICAL DATA:  Acute abdominal series shows no significant findings.  No change except for minimal bibasilar atelectasis. Gastric emptying study done on January 16 demonstrated marked delay in gastric emptying with 80% of activity remaining in the stomach at 120 minutes normal being 30%.  CT scan of his abdomen is pending.  ASSESSMENT: 1. Acute abdominal pain. 2. Differential includes intra-abdominal abscess, renal stones or     diverticulitis. 3. The fact that this gentleman has not been vomiting makes     gastroparesis less likely but is also a possibility. 4. Diabetes type 2, uncontrolled. 5. Gastroesophageal reflux disease. 6. Hyperlipidemia. 7. Coronary artery disease. 8. Obstructive sleep apnea. 9. Left leg abscess and cellulitis, probable cause of his     leukocytosis.  PLAN:  I cannot make a definitive statement on this gentleman's disposition without the results of pending labs such as urinalysis and CT scan of the abdomen; if the cause is found to be a renal stone, emergency room physician may consider a urology consult if the cause is found to be abscess, consideration would be given to admitting this gentleman with a surgical consult.  For other cause, he may need surgery admission.  We will wait for results of urinalysis and CT.     Vania Rea, M.D.     LC/MEDQ  D:  07/05/2010  T:  07/05/2010   Job:  [id number]  cc:   April Palumbo-Rasch, MD  Toni Arthurs, MD Fax: [phone]  Fransisco Hertz, M.D. Fax: [phone]  Massie Maroon, MD Fax: [phone]  Nanetta Batty, M.D. Fax: [phone]  Electronically Signed by Vania Rea M.D. on 07/07/2010 09:51:28 PM MedRecNo: [id number] [facility], Account: [id number], DocSeq: [id number] Since patient's Urinalysis is negative for blood, renal colic is unlikely, and since patient has has multiple medical problems, he is admitted to the Hospitalist Service, for care, until CT scan results become available.  Dr Toni Arthurs was consulted for management of the abscess of the left leg.  CT scan of the abdomen was questionable for cholecysttis or duodenal perforation and was discussed with Dr. Zachery Dakins, in consultation.  Electronically Signed by Vania Rea M.D. on 07/07/2010 09:49:59 PM

## 2010-07-07 NOTE — H&P (Signed)
NAMEMarland Kitchen  [facility], [place] NO.:  [id number]  MEDICAL RECORD NO.:  [id number]          PATIENT TYPE:  EMS  LOCATION:  ED                           FACILITY:  [facility]  PHYSICIAN:  Ruthy Dick, MD    DATE OF BIRTH:  1951/05/31  DATE OF ADMISSION:  06/30/2010 DATE OF DISCHARGE:                             HISTORY & PHYSICAL   PRIMARY CARE PHYSICIAN:  Massie Maroon, M.D.  CHIEF COMPLAINT:  Vomiting.  HISTORY OF PRESENT ILLNESS:  This is a 59 year old male with insulin- dependent diabetes who has multiple diabetic sequelae including peripheral neuropathy, probable gastroparesis, and amputations for nonhealing ulcerations. He tells me that he has been nauseated since discharge on December 30, but began vomiting last night at approximately 2:00 a.m. and is now unable to retain any  oral food or medicines.  The patient had two upper endoscopies in May 2011 that showed no definitive cause for vomiting.  They ruled out ulcer or gastric outlet obstruction.  Rather his diagnosis on endoscopy was bile reflux gastritis.  He reports that he did have a gastric emptying scan years ago [facility] that was normal.  The patient denies any hematemesis, chest pain, abdominal pain, shortness of breath, or fever; however, he does appear tremulous in his arms and through his chest.  He also tells me he had burning on urination for the first time today.  PAST MEDICAL HISTORY: 1. Diabetes mellitus, type 2, insulin-dependent. 2. Gastroparesis. 3. Peripheral neuropathy. 4. Coronary artery disease with a drug-eluting stent placed in     December 2010. 5. Hypertension. 6. Left lower extremity Charcot joint repair. 7. Right great toe amputation. 8. History of left lower extremity cellulitis. 9. Depression. 10.GERD. 11.Hyperlipidemia. 12.Obstructive sleep apnea.  ALLERGIES:  He has allergies to CODEINE, GABAPENTIN and RELAFEN. CODEINE and GABAPENTIN cause hives, nausea and vomiting.  His  reaction to RELAFEN is unknown.  HOME MEDICATIONS: 1. Actos 30 mg 1 tablet daily. 2. Aspirin 81 mg 1 tablet daily. 3. Equate arthritis pain reliever, OTC, 2 tablets daily p.r.n. pain. 4. Erythromycin 250 mg 1 tablet p.o. t.i.d. 5. Janumet 50/500 1 tablet b.i.d. 6. Lantus subcutaneous 25-70 units b.i.d. 7. Lyrica 300 mg 1 tablet b.i.d. 8. Meclizine 25 mg 1 tablet t.i.d. 9. Metoprolol XL 25 mg 1 tablet daily. 10.Multivitamin 1 tablet daily. 11.Prevacid 30 mg 1 tablet b.i.d. 12.Promethazine 12.5 mg p.o. 1 tablet every hour p.r.n. nausea. 13.Reglan 10 mg 1 tablet t.i.d. 14.Scopolamine 1.5 mg/72-hour patch, one patch q.24 hours. 15.Senna with docusate sodium 8.6/50 mg 1 tablet daily. 16.Simvastatin 20 mg 1 tablet q.h.s. 17.Zofran 4 mg 1 tablet q.8 p.r.n.  REVIEW OF SYSTEMS:  Pertinent for burning with urination this morning,and tremulousness.  Otherwise CONSTITUTIONAL:  The patient denies any fevers, night sweats, anorexia or insomnia.  HEAD: He denies any headache, changes in his vision pain in his ears, eyes, nose or throat. CHEST: He denies any difficulty breathing, cough or hemoptysis. CARDIOVASCULAR: He denies any chest pain, palpitations, orthopnea, PND or lower extremity swelling.  GASTROINTESTINAL: He is here with vomiting.  He denies any changes in his bowel habits  or blood in his emesis.  He denies any abdominal pain.  GENITOURINARY: He reports some dysuria, pain with urination, but no blood in his urine or urinary frequency.  EXTREMITIES: No acute pain or swelling in his extremities. SKIN:  He denies any rashes, bruises or lesions.  NEUROLOGIC:  He denies any syncope or seizures.  PSYCHIATRIC:  He denies any depression or suicidal ideations.  FAMILY HISTORY:  Significant for his mother who died at 58 with COPD and lung cancer and his father who died at 28 years old with a trauma.  SOCIAL HISTORY:  Negative for drugs, alcohol and tobacco.  He is a full code.  Lives at  home with his brother who is his primary caretaker.  PHYSICAL EXAMINATION:  He is alert and oriented in no apparent distress. His face is flushed red.  Temperature 98.0, pulse 102, respirations 20, and blood pressure 132/82. HEAD:  Atraumatic, normocephalic.  Eyes are anicteric with pupils that are equal, round, and reactive to light.  His nose shows no nasal discharge or exterior lesions.  His mouth has poor dentition with moist mucous membranes.  No erythema in his oropharynx. NECK:  Supple with midline trachea.  No JVD or lymphadenopathy. CHEST:  Demonstrates no accessory muscle use.  There are no wheezes or crackles to my auscultation. HEART:  Tachycardic without murmurs, rubs or gallops. ABDOMEN: Obese.  He has several small bruises in the right lower quadrant of the abdomen that is nontender.  He has bowel sounds, but I am unable to appreciate any organomegaly secondary to his obesity. EXTREMITIES: He has decreased sensation on his feet bilaterally.  Right lower extremity is slightly swollen in comparison to left.  His skin is very dry. I do not see any breakdown on the soles of his feet.  He has multiple bruises, particularly on his left lower extremity and his upper extremities. NEUROLOGIC:  Cranial nerves II-XII appear to be grossly intact.  He has no facial asymmetries, no obvious focal neuro deficits. PSYCHIATRIC:  The patient is awake and alert.  His demeanor is appropriate.  His grooming is moderate.  LABORATORY DATA:  Labs are pertinent for a hemoglobin of 14.3, hematocrit 42.0, white count 7.1, platelets 217.  BMET was within normal limits with the exception of a glucose of 180.  Urine glucose is 250. He has no nitrates or leukocyte esterase in his urine.  Radiological exams acute abdominal series was negative.  ASSESSMENT: 1. Dr. Ruthy Dick has seen and examined the patient, collected     the history, reviewed his chart and spoken at length with the     patient  and the PA about the case.  His impression is this is a 71-     year-old, obese male with diabetes who presents to the ED with     recurrent vomiting.  Differential includes recurrent gastroparesis,     possible gastroenteritis, need to rule out other etiologies. 2. He has diabetes mellitus with a CBG of approximately 180 and a     hemoglobin A1c in the range of 7. 3. Mild dehydration. 4. Comorbidities including coronary artery disease, gastroesophageal     reflux disease, hyperlipidemia, and peripheral neuropathy are all     stable.  PLAN: 1. We will admit him to a regular bed, provide him with IV fluids,     Zofran and Reglan.  We will obtain a gastric emptying scan as it     has been years since he had one done,  and his last one was normal.     We will also check an abdominal ultrasound searching for other     causes of vomiting.  May consider a CT head if there is any concern for tumor. 2. For his diabetes mellitus, he will be placed on sliding scale     insulin with both Lantus and NovoLog. 3. For his dehydration, he will be given gentle IV fluids and     monitored closely. 4. For his peripheral neuropathy, his Lyrica will be continued. 5. For hypertension, his metoprolol will be continued. 6. This patient is a full code. 7. Further recommendations will be forthcoming pending this patient's     medical evolution.     Stephani Police, PA   ______________________________ Ruthy Dick, MD    MLY/MEDQ  D:  06/30/2010  T:  06/30/2010  Job:  [id number]  cc:   Massie Maroon, MD Fax: [phone]  Electronically Signed by Algis Downs PA on 07/02/2010 06:44:04 AM Electronically Signed by Ruthy Dick  on 07/06/2010 06:55:48 PM

## 2010-07-10 LAB — COMPREHENSIVE METABOLIC PANEL
ALT: 26 U/L (ref 0–53)
ALT: 50 U/L (ref 0–53)
ALT: 41 U/L (ref 0–53)
AST: 36 U/L (ref 0–37)
Albumin: 3.6 g/dL (ref 3.5–5.2)
Albumin: 3 g/dL — ABNORMAL LOW (ref 3.5–5.2)
Albumin: 2.5 g/dL — ABNORMAL LOW (ref 3.5–5.2)
Alkaline Phosphatase: 62 U/L (ref 39–117)
Alkaline Phosphatase: 60 U/L (ref 39–117)
BUN: 23 mg/dL (ref 6–23)
BUN: 27 mg/dL — ABNORMAL HIGH (ref 6–23)
CO2: 25 mEq/L (ref 19–32)
CO2: 27 mEq/L (ref 19–32)
Calcium: 8.7 mg/dL (ref 8.4–10.5)
Chloride: 104 mEq/L (ref 96–112)
Creatinine, Ser: 1.53 mg/dL — ABNORMAL HIGH (ref 0.4–1.5)
GFR calc Af Amer: 57 mL/min — ABNORMAL LOW (ref >60)
GFR calc Af Amer: >60 mL/min (ref >60)
GFR calc non Af Amer: >60 mL/min (ref >60)
GFR calc non Af Amer: 47 mL/min — ABNORMAL LOW (ref >60)
Glucose, Bld: 215 mg/dL — ABNORMAL HIGH (ref 70–99)
Potassium: 4.6 mEq/L (ref 3.5–5.1)
Potassium: 4.4 mEq/L (ref 3.5–5.1)
Sodium: 138 mEq/L (ref 135–145)
Total Bilirubin: 1.7 mg/dL — ABNORMAL HIGH (ref 0.3–1.2)
Total Protein: 7.1 g/dL (ref 6.0–8.3)
Total Protein: 6.1 g/dL (ref 6.0–8.3)

## 2010-07-10 LAB — DIFFERENTIAL
Eosinophils Relative: 0 % (ref 0–5)
Lymphs Abs: 0.6 10*3/uL — ABNORMAL LOW (ref 0.7–4.0)

## 2010-07-10 LAB — BASIC METABOLIC PANEL
BUN: 27 mg/dL — ABNORMAL HIGH (ref 6–23)
Calcium: 8.5 mg/dL (ref 8.4–10.5)
GFR calc non Af Amer: 50 mL/min — ABNORMAL LOW (ref >60)
Potassium: 4.3 mEq/L (ref 3.5–5.1)
Sodium: 139 mEq/L (ref 135–145)

## 2010-07-10 LAB — CBC
HCT: 40.1 % (ref 39.0–52.0)
HCT: 33.3 % — ABNORMAL LOW (ref 39.0–52.0)
HCT: 32.5 % — ABNORMAL LOW (ref 39.0–52.0)
Hemoglobin: 13.3 g/dL (ref 13.0–17.0)
Hemoglobin: 10.8 g/dL — ABNORMAL LOW (ref 13.0–17.0)
MCH: 29.1 pg (ref 26.0–34.0)
MCHC: 33.2 g/dL (ref 30.0–36.0)
MCHC: 32.4 g/dL (ref 30.0–36.0)
MCHC: 32 g/dL (ref 30.0–36.0)
MCV: 87.7 fL (ref 78.0–100.0)
MCV: 88.7 fL (ref 78.0–100.0)
Platelets: 196 10*3/uL (ref 150–400)
Platelets: 193 10*3/uL (ref 150–400)
Platelets: 181 10*3/uL (ref 150–400)
RBC: 4.17 MIL/uL — ABNORMAL LOW (ref 4.22–5.81)
RDW: 16.2 % — ABNORMAL HIGH (ref 11.5–15.5)
RDW: 16.7 % — ABNORMAL HIGH (ref 11.5–15.5)
RDW: 16.1 % — ABNORMAL HIGH (ref 11.5–15.5)
WBC: 15 10*3/uL — ABNORMAL HIGH (ref 4.0–10.5)
WBC: 12.7 10*3/uL — ABNORMAL HIGH (ref 4.0–10.5)
WBC: 11.7 10*3/uL — ABNORMAL HIGH (ref 4.0–10.5)

## 2010-07-10 LAB — BLOOD GAS, ARTERIAL
Acid-base deficit: 2.7 mmol/L — ABNORMAL HIGH (ref 0.0–2.0)
FIO2: 0.5 %
Patient temperature: 97.1
TCO2: 22.3 mmol/L (ref 0–100)
pCO2 arterial: 49.3 mmHg — ABNORMAL HIGH (ref 35.0–45.0)
pH, Arterial: 7.298 — ABNORMAL LOW (ref 7.350–7.450)
pO2, Arterial: 88.2 mmHg (ref 80.0–100.0)

## 2010-07-10 LAB — LIPASE, BLOOD: Lipase: 14 U/L (ref 11–59)

## 2010-07-10 LAB — GLUCOSE, CAPILLARY
Glucose-Capillary: 191 mg/dL — ABNORMAL HIGH (ref 70–99)
Glucose-Capillary: 253 mg/dL — ABNORMAL HIGH (ref 70–99)
Glucose-Capillary: 257 mg/dL — ABNORMAL HIGH (ref 70–99)
Glucose-Capillary: 170 mg/dL — ABNORMAL HIGH (ref 70–99)
Glucose-Capillary: 180 mg/dL — ABNORMAL HIGH (ref 70–99)
Glucose-Capillary: 192 mg/dL — ABNORMAL HIGH (ref 70–99)
Glucose-Capillary: 254 mg/dL — ABNORMAL HIGH (ref 70–99)

## 2010-07-10 LAB — POCT CARDIAC MARKERS
CKMB, poc: <1 ng/mL — ABNORMAL LOW (ref 1.0–8.0)
Troponin i, poc: <0.05 ng/mL (ref 0.00–0.09)

## 2010-07-10 LAB — URINALYSIS, ROUTINE W REFLEX MICROSCOPIC
Hgb urine dipstick: NEGATIVE
Urine Glucose, Fasting: >1000 mg/dL — AB
pH: 6.5 (ref 5.0–8.0)

## 2010-07-10 LAB — LACTIC ACID, PLASMA: Lactic Acid, Venous: 1.4 mmol/L (ref 0.5–2.2)

## 2010-07-10 LAB — URINE CULTURE: Culture: NO GROWTH

## 2010-07-10 LAB — APTT: aPTT: 47 seconds — ABNORMAL HIGH (ref 24–37)

## 2010-07-10 LAB — PROTIME-INR
INR: 1.37 (ref 0.00–1.49)
Prothrombin Time: 17.1 seconds — ABNORMAL HIGH (ref 11.6–15.2)

## 2010-07-10 NOTE — Consult Note (Signed)
[name], [name] NO.:  [id number]  MEDICAL RECORD NO.:  [id number]          PATIENT TYPE:  INP  LOCATION:  1342                         FACILITY:  [facility]  PHYSICIAN:  Toni Arthurs, MD        DATE OF BIRTH:  [birth year]  DATE OF CONSULTATION:  07/06/2010 DATE OF DISCHARGE:                                CONSULTATION   REASON FOR CONSULTATION:  Left leg abscess.  HISTORY OF PRESENT ILLNESS:  Samuel [name] is a 60 year old male with a past medical history significant for diabetes and Charcot arthropathy of the left foot who he is [name] to [facility] with complaints of abdominal pain.  I am asked by his primary physician, Dr. Orvan [name], to evaluate the patient for an abscess on his left leg.  Samuel [name] had an episode of Charcot arthropathy of his left foot with collapse and dislocation across the tarsometatarsal joint last year.  At that time, I performed an open reduction of his left foot wound and applied a thin wire external fixator to hold the foot in a plantigrade position as the fusion site healed.  He had the frame removed last fall.  Since that time, he has had an episode of a subcutaneous abscess at the medial aspect of the leg on the left.  I treated this with I&D and local wound care with oral antibiotics, and it resolved completely.  He complains of having increasing swelling at this area with some drainage over the last week.  He also has had trouble with gastroparesis and has had a recent admission, and finally was [name] again yesterday.  He had not been generally feeling poorly with mostly abdominal pain.  He denies any pain in the leg.  He denies fever or chills but has had nausea and vomiting. He is not a smoker but is a diabetic and has been for many years.  PAST MEDICAL HISTORY:  Type 2 diabetes, hypertension, peripheral neuropathy, hypercholesterolemia.  PAST SURGICAL HISTORY:  Left foot tarsometatarsal arthrodesis with a thin  wire frame.  FAMILY HISTORY:  Positive for hypertension.  SOCIAL HISTORY:  The patient lives with his brother.  He is on disability.  REVIEW OF SYSTEMS:  As above.  PHYSICAL EXAMINATION:  The patient is a well-nourished, well-developed male in moderate distress due to his abdominal pain.  He is alert and oriented x4.  Mood and affect normal.  Extraocular motions are intact. Respirations are unlabored.  His left leg has multiple well-healed surgical scars and pin tracts.  The left foot is generally plantigrade. His swelling has resolved significantly over the last few months about the midfoot.  He has palpable pulses, sensibility to light touch is severely diminished throughout the foot to the level just above the ankle.  He has 5/5 strength in plantar flexion and dorsiflexion of the ankle.  He has no lymphadenopathy.  The left medial leg has a 2-cm area of fluctuance adjacent to a pin tract at the site of one of his half pins.  I am able to express grossly purulent material from this area. There is no significant  surrounding cellulitis.  X-RAYS:  AP and lateral views of the left leg are reviewed and they show pin tract sites that her largely healed.  There is no evidence of periosteal reaction or other bony signs of osteomyelitis.  ASSESSMENT:  Left leg abscess with possible underlying osteomyelitis.  PLAN:  I explained to the patient that given the recurrent nature of this subcutaneous abscess, I am concerned that he may have an underlying nidus of infection at the level of the bone.  He may very well have osteomyelitis.  He is scheduled to go to surgery with Dr. Zachery Dakins today for a cholecystectomy.  Given that he is already scheduled for a surgery requiring general anesthesia, I think it would be most prudent to dbride and irrigate this abscess.  If he has involvement of the bone, we will certainly dbride the affected bone as well.  This will be the opportunity to send  deep cultures and allow [place] to tailor his antibiotic regimen postoperatively.  If there is any significant wound after the debridement, then we will consider applying wound VAC.  The patient understands the risks and benefits of this procedure including bleeding, infection, nerve damage, blood clots, need for additional surgery, amputation and death.  He elects to proceed.  We will get him scheduled for surgery in conjunction with Dr. Zachery Dakins.  I spoke with Dr. Zachery Dakins by phone, and he was in agreement with this plan.  This represents complex medical decision-making and has the significant risk of costing him his leg if this goes untreated.     Toni Arthurs, MD     JH/MEDQ  D:  07/06/2010  T:  07/06/2010  Job:  [id number]  Electronically Signed by Jonny Ruiz HEWITT  on 07/10/2010 03:56:27 PM

## 2010-07-10 NOTE — Op Note (Signed)
Samuel [name], [name] NO.:  [id number]  MEDICAL RECORD NO.:  [id number]          PATIENT TYPE:  INP  LOCATION:  1342                         FACILITY:  [facility]  PHYSICIAN:  Samuel Arthurs, MD        DATE OF BIRTH:  Jun 14, 1951  DATE OF PROCEDURE:  07/06/2010 DATE OF DISCHARGE:                              OPERATIVE REPORT   PREOPERATIVE DIAGNOSIS:  Left leg abscess.  POSTOPERATIVE DIAGNOSES: 1. Left leg abscess. 2. Left leg osteomyelitis.  PROCEDURE: 1. Irrigation and debridement of left leg subcutaneous abscess. 2. Sequestrectomy of the left tibia. 3. Application of Wound Vac to left leg.  SURGEON:  Samuel Arthurs, MD  ANESTHESIA:  General.  IV FLUIDS:  7 mL Lactated Ringer's.  ESTIMATED BLOOD LOSS:  Minimal.  TOURNIQUET TIME:  Zero.  COMPLICATIONS:  None apparent.  SPECIMEN:  Deep tissue to Microbiology for aerobic and anaerobic culture.  DISPOSITION:  Intubated and stable to Dr. Zachery [name] for his portion of the operation.  INDICATIONS FOR PROCEDURE:  The patient is a 60 year old male with past medical history significant for type 2 diabetes.  His diabetes has been complicated by a Charcot neuroarthropathy of the left foot.  This required open reduction and arthrodesis of the left foot tarsometatarsal joint with the use of a thin wire frame.  The half-pin site on the left medial leg has had an episode of subcutaneous abscess in the past.  On this admission to the hospital, he presents with another abscess.  He presents now for operative treatment of this condition in conjunction with Dr. Zachery [name] who will be performing an abdominal procedure.  The patient understands the risks and benefits of this procedure including bleeding, infection, nerve damage, blood clots, need for additional surgery, amputation and death.  He elects to proceed.  PROCEDURE IN DETAIL:  After preoperative consent was obtained, the correct operative site was identified, and  the patient was brought to the operating room and placed supine on the operating table.  General anesthesia was induced.  Preoperative antibiotics were already being administered.  Surgical time-out was taken.  The left lower extremity was prepped and draped in standard sterile fashion.  The patient's abscess was identified at the medial left leg adjacent to the site of the previous pin tract.  The tract was incised with an ellipsoid incision.  The incision was then extended proximally and distally. Subcutaneous tissue was dissected to the level of the abscess and circumferentially around the abscess.  All of the tissue within and adjacent to the abscess was debrided and sent as a specimen to microbiology.  The abscess was adjacent to the bone and there was a palpable defect within the anteromedial cortex of the tibia.  This was well circumscribed with no evidence of soft cortical bone surrounding it.  The previous pin tract was identified.  A small curette was used to thoroughly curette this out through the lateral cortex of the tibia and medially to the level of the abscess.  The superficial part of the tibia was also curetted.  All the subcutaneous tissue was curetted.  The wound was  then irrigated copiously with 3 L of fluid.  A Wound Vac sponge was cut to fit the wound which measured 3 cm x 1 cm x 1 cm deep.  The Wound Vac dressing was applied and suction was applied.  An appropriate seal was noted.  At that point, the patient was turned over to Dr. Zachery [name] for the abdominal surgery.  FOLLOWUP PLAN:  The patient will be maintained on IV vancomycin and Infectious Disease consultation will be obtained to tailor the appropriate antibiotic regimen.  The Wound Vac will be continued while the patient is an inpatient and hopefully can be discontinued at the time of discharge.     Samuel Arthurs, MD     JH/MEDQ  D:  07/06/2010  T:  07/06/2010  Job:  [id number]  Electronically Signed  by Samuel [name]  on 07/10/2010 03:56:34 PM

## 2010-07-11 LAB — SEDIMENTATION RATE: Sed Rate: 95 mm/hr — ABNORMAL HIGH (ref 0–16)

## 2010-07-11 LAB — GLUCOSE, CAPILLARY
Glucose-Capillary: 133 mg/dL — ABNORMAL HIGH (ref 70–99)
Glucose-Capillary: 144 mg/dL — ABNORMAL HIGH (ref 70–99)
Glucose-Capillary: 139 mg/dL — ABNORMAL HIGH (ref 70–99)
Glucose-Capillary: 137 mg/dL — ABNORMAL HIGH (ref 70–99)
Glucose-Capillary: 149 mg/dL — ABNORMAL HIGH (ref 70–99)
Glucose-Capillary: 171 mg/dL — ABNORMAL HIGH (ref 70–99)
Glucose-Capillary: 145 mg/dL — ABNORMAL HIGH (ref 70–99)

## 2010-07-11 LAB — BASIC METABOLIC PANEL
BUN: 11 mg/dL (ref 6–23)
CO2: 27 mEq/L (ref 19–32)
Chloride: 100 mEq/L (ref 96–112)
GFR calc Af Amer: >60 mL/min (ref >60)
GFR calc non Af Amer: >60 mL/min (ref >60)
Glucose, Bld: 136 mg/dL — ABNORMAL HIGH (ref 70–99)
Potassium: 3.7 mEq/L (ref 3.5–5.1)
Potassium: 3.3 mEq/L — ABNORMAL LOW (ref 3.5–5.1)
Sodium: 136 mEq/L (ref 135–145)
Sodium: 137 mEq/L (ref 135–145)

## 2010-07-11 LAB — CBC
HCT: 29.3 % — ABNORMAL LOW (ref 39.0–52.0)
HCT: 30 % — ABNORMAL LOW (ref 39.0–52.0)
Hemoglobin: 10 g/dL — ABNORMAL LOW (ref 13.0–17.0)
MCH: 28.3 pg (ref 26.0–34.0)
MCV: 86.4 fL (ref 78.0–100.0)
MCV: 84.6 fL (ref 78.0–100.0)
Platelets: 249 10*3/uL (ref 150–400)
RBC: 3.39 MIL/uL — ABNORMAL LOW (ref 4.22–5.81)
RBC: 3.77 MIL/uL — ABNORMAL LOW (ref 4.22–5.81)
RDW: 15.7 % — ABNORMAL HIGH (ref 11.5–15.5)
WBC: 8.5 10*3/uL (ref 4.0–10.5)
WBC: 5.8 10*3/uL (ref 4.0–10.5)
WBC: 7.7 10*3/uL (ref 4.0–10.5)

## 2010-07-11 LAB — TISSUE CULTURE

## 2010-07-11 LAB — COMPREHENSIVE METABOLIC PANEL
Alkaline Phosphatase: 52 U/L (ref 39–117)
BUN: 13 mg/dL (ref 6–23)
Chloride: 104 mEq/L (ref 96–112)
Creatinine, Ser: 1.02 mg/dL (ref 0.4–1.5)
Glucose, Bld: 156 mg/dL — ABNORMAL HIGH (ref 70–99)
Potassium: 4.2 mEq/L (ref 3.5–5.1)
Total Bilirubin: 1.2 mg/dL (ref 0.3–1.2)

## 2010-07-11 LAB — MAGNESIUM: Magnesium: 1.7 mg/dL (ref 1.5–2.5)

## 2010-07-12 LAB — COMPREHENSIVE METABOLIC PANEL
AST: 14 U/L (ref 0–37)
Albumin: 2.5 g/dL — ABNORMAL LOW (ref 3.5–5.2)
Alkaline Phosphatase: 52 U/L (ref 39–117)
Chloride: 102 mEq/L (ref 96–112)
GFR calc Af Amer: >60 mL/min (ref >60)
Potassium: 3.4 mEq/L — ABNORMAL LOW (ref 3.5–5.1)
Total Bilirubin: 1.1 mg/dL (ref 0.3–1.2)
Total Protein: 6.4 g/dL (ref 6.0–8.3)

## 2010-07-12 LAB — GLUCOSE, CAPILLARY
Glucose-Capillary: 126 mg/dL — ABNORMAL HIGH (ref 70–99)
Glucose-Capillary: 129 mg/dL — ABNORMAL HIGH (ref 70–99)
Glucose-Capillary: 167 mg/dL — ABNORMAL HIGH (ref 70–99)
Glucose-Capillary: 113 mg/dL — ABNORMAL HIGH (ref 70–99)
Glucose-Capillary: 125 mg/dL — ABNORMAL HIGH (ref 70–99)
Glucose-Capillary: 138 mg/dL — ABNORMAL HIGH (ref 70–99)
Glucose-Capillary: 89 mg/dL (ref 70–99)

## 2010-07-12 LAB — BASIC METABOLIC PANEL
Calcium: 9 mg/dL (ref 8.4–10.5)
Creatinine, Ser: 1.14 mg/dL (ref 0.4–1.5)
GFR calc Af Amer: >60 mL/min (ref >60)
Sodium: 139 mEq/L (ref 135–145)

## 2010-07-12 LAB — CBC
Platelets: 284 10*3/uL (ref 150–400)
RBC: 3.61 MIL/uL — ABNORMAL LOW (ref 4.22–5.81)
RDW: 15.1 % (ref 11.5–15.5)
WBC: 7.3 10*3/uL (ref 4.0–10.5)

## 2010-07-12 LAB — ANAEROBIC CULTURE

## 2010-07-12 LAB — PREALBUMIN: Prealbumin: 16 mg/dL — ABNORMAL LOW (ref 17.0–34.0)

## 2010-07-13 LAB — CBC
HCT: 30 % — ABNORMAL LOW (ref 39.0–52.0)
Hemoglobin: 10.1 g/dL — ABNORMAL LOW (ref 13.0–17.0)
MCV: 85.5 fL (ref 78.0–100.0)
RBC: 3.51 MIL/uL — ABNORMAL LOW (ref 4.22–5.81)
RDW: 16 % — ABNORMAL HIGH (ref 11.5–15.5)
WBC: 5.3 10*3/uL (ref 4.0–10.5)

## 2010-07-13 LAB — BASIC METABOLIC PANEL
CO2: 29 mEq/L (ref 19–32)
Chloride: 106 mEq/L (ref 96–112)
GFR calc non Af Amer: >60 mL/min (ref >60)
Glucose, Bld: 115 mg/dL — ABNORMAL HIGH (ref 70–99)
Potassium: 3.3 mEq/L — ABNORMAL LOW (ref 3.5–5.1)
Sodium: 143 mEq/L (ref 135–145)

## 2010-07-13 LAB — GLUCOSE, CAPILLARY: Glucose-Capillary: 201 mg/dL — ABNORMAL HIGH (ref 70–99)

## 2010-07-14 LAB — BASIC METABOLIC PANEL
BUN: 6 mg/dL (ref 6–23)
CO2: 28 mEq/L (ref 19–32)
Chloride: 105 mEq/L (ref 96–112)
Potassium: 3.2 mEq/L — ABNORMAL LOW (ref 3.5–5.1)

## 2010-07-14 LAB — GLUCOSE, CAPILLARY
Glucose-Capillary: 137 mg/dL — ABNORMAL HIGH (ref 70–99)
Glucose-Capillary: 163 mg/dL — ABNORMAL HIGH (ref 70–99)

## 2010-07-15 NOTE — H&P (Signed)
NAMEKOHEN, REITHER NO.:  [id number]  MEDICAL RECORD NO.:  [id number]          PATIENT TYPE:  INP  LOCATION:  0104                         FACILITY:  [facility]  PHYSICIAN:  Anselm Pancoast. Zachery Dakins, M.D.DATE OF BIRTH:  1950/06/21  DATE OF ADMISSION:  07/05/2010 DATE OF DISCHARGE:                             HISTORY & PHYSICAL   HISTORY:  Samuel [name] is a 60 year old overweight diabetic of longstanding who was recently discharged from [facility] following a four or five day history of evaluation for nausea and vomiting thought secondary to gastroparesis.  During that admission, he had an ultrasound that showed a kind of distended gallbladder but was not thought to be acute cholecystitis.  He had a gastroparesis study that did not show evidence of obviously a gastric outlet obstruction, and he has a history of hypertension.  He has got peripheral neuropathy, depression, gastroesophageal reflux, sleep apnea, and his dehydration was corrected with intravenous fluids.  He was discharged on Tuesday, two days ago, with instructions to see his doctor, Dr. Selena Batten, in the office.  He had an appointment for today but because of onset of pain last evening, he came to the emergency room here at [facility] instead of going to his doctors office.  He said when he started having the pain and nausea and vomiting, the nausea and vomiting has been going on prior to yesterday, he had not had this pain in the right upper quadrant previously.  When he arrived here today, he was seen by the ER physicians.  Laboratory studies show a white count of 15,000.  His white count was 8000 when he was seen on Monday or Tuesday of this week.  His hematocrit today is 40, and his previous hematocrit was 36.  He is afebrile with a pulse of 98 and has been seen by the hospitalist to readmit him.  Then when the CT of the head that had been ordered was read showing possibly a fluid collection around the  second or third portion of the duodenum that might be a perforation of the duodenum.  A surgical consultation was requested.  The patient says that he has been endoscoped, also upper and lower, back in May 2011.  He was not told that he had an ulcer at that time and had the diagnosis of gastroparesis.  He said he has had chronic pain on the right side for many years.  It is just a whole lot more intense now.  He is followed by the orthopedic doctors since he has had Charcot joint, neuropathy, and also he has coronary artery disease.  He had a stent placed in December of 2010.  The patient, at present, is not in ___________________.  His lactic acid was normal, and he is mildly tender in the right upper quadrant but is not a generalized peritonitis. I have reviewed the CT with Dr. Gery Pray and also Dr. Constance Goltz who was actually reading it, and we are of the opinion we think we see a small stone in the gallbladder.  The gallbladder itself is distended but is not acutely thickened like this is  a primary gallbladder problem, but he certainly does have fluid around the second and third portion of the duodenum with a little area of a collection.  Could this be a little perforation?  I think that the patient definitely needs to be readmitted.  He needs to be placed nothing by mouth, broad antibiotics, and we will arrange for a gastroenterology consult and have an upper endoscopy performed tomorrow morning, and then he will need a surgery. Hopefully, it is just a gallbladder issue, but attempt at laparoscopic cholecystectomy can be entertained and not an open procedure.  If this is a chronic perforation of the second or third portion of the duodenum, then obviously a more complex surgery which would require an open procedure will be needed.  The patient lists CODEINE and NEURONTIN as an ALLERGIC REACTION, so I think we can give him Zosyn, and I will give him H2 blockers now to get him medically managed  for his diabetes, etc., and arrange for a consultation for an upper endoscopy probably in the morning.  I do not know who he has seen previously for gastroenterology. He follows with [facility].  Will see who he has seen previously, and see if we can arrange for the same group to see him.     Anselm Pancoast. Zachery Dakins, M.D.     WJW/MEDQ  D:  07/05/2010  T:  07/05/2010  Job:  [id number]  Electronically Signed by Consuello Bossier M.D. on 07/15/2010 11:34:48 AM

## 2010-07-15 NOTE — Op Note (Signed)
NAMEJEFTE, [name] NO.:  [id number]  MEDICAL RECORD NO.:  [id number]          PATIENT TYPE:  INP  LOCATION:  1342                         FACILITY:  [facility]  PHYSICIAN:  Anselm Pancoast. Weatherly, M.D.DATE OF BIRTH:  1950/07/14  DATE OF PROCEDURE:  07/06/2010 DATE OF DISCHARGE:                              OPERATIVE REPORT   PREOPERATIVE DIAGNOSES: 1. Acute cholecystitis. 2. Fluid around the 2nd and 3rd portion of the duodenum.  POSTOPERATIVE DIAGNOSES: 1. Perforated gangrenous cholecystitis with stones. 2. Diabetes mellitus. 3. History of gastroparesis. 4. He also had an osteomyelitis of the left tibia that an orthopedic     doctor operated on before of the general surgery portion.  HISTORY:  Samuel [name] is a 60 year old diabetic of long-standing; Dr. Selena Batten is his regular physician.  He has had a very complex history with nausea and vomiting, had been in and out of the hospital several times over the last year.  Back in the Spring, he had an upper endoscopy and a colonoscopy by Dr. Matthias Hughs with nothing identified.  His nausea and vomiting was thought to be related to the gastroparesis secondary to his diabetes.  He has kind of limped along.  He has had a shorter joint of the left lower extremity that has been treated with pins, and etc., and then last week he had significant nausea and vomiting, was readmitted to the hospital on the hospitalist service, and during that study period, did not have an elevated white count, did not have abnormal liver function studies, and I think an ultrasound of the gallbladder was done which did not show stones.  A gastric emptying study was consistent with gastroparesis but not an obstruction and he was discharged on Tuesday. He was not on antibiotics, and then on Wednesday night, he had the severe onset of pain in the upper abdomen, it was different from the chronic discomfort of nausea and vomiting that he has had, did not  come to the emergency room until Wednesday night, but came early the next morning.  He had an appointment with the orthopedic surgeon that day because of his leg which he has tenderness and kind of a fluctuant area where the external pins when he was treated.  He arrived in the emergency room sometime in the morning on January 18.  The ER record show, I think the 1st time he received his medicine was about noontime and he was seen again by the hospitalist, they planned to admit him but they got a CT of the abdomen, and then the CT of the abdomen was question of whether or not duodenal problem .  I was called probably about 6 or 5:30 p.m. after the CT showed fluid around the 2nd and 3rd portion of the duodenum but no free air, and the radiologist was thinking that this is very likely  perferated duodenal ulcer except there was no air in the fluid and there was marked fluid up around in the gallbladder that was distended, and you could see definitely stones on the CT in the neck of the gallbladder.  I saw him, he  was not toxic.  We started him on antibiotics, the hospitalist had also seen him, and we elected to go ahead and admit him, put him on intravenous fluids and talked with the gastroenterologist about doing an upper endoscopy this morning since we felt that this was probably a gallbladder but with the findings of the fluid in neck and around the gallbladder, the radiologist said this was very unusual if this was just an acute cholecystitis.  This morning, Dr. Madilyn Fireman did endoscope him with no evidence of any ulcer.  We can get down to 2nd and 3rd portion of the duodenum.  The radiologist on being informed of this, looking at the films recommended that we get a repeat CT without contrast which was done about 11 a.m., and there was more fluid but there was no free air.  I did schedule him for a cholecystectomy.  The orthopedic doctor saw him and wanted to drain the little abscess and  treat him for the osteo simultaneously and was available at 12:30, and since I was not sure how long we will take from the gallbladder, told him he can go ahead and go first.  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  After the orthopedic surgeon had completed, and of course we placed a Foley catheter prior to this we repositioned him on the OR table, prepped him with Betadine solution and draped him in a sterile manner.  He is a large individual with a very large abdomen, and I made a little incision at the umbilicus, could get my finger in the intra-abdominal space, put in the pursestring suture and then put in this cannula.  We could see a little bit of fluid lateral that looked kind of like bile stain fluid.  We could not see the gallbladder and the omentum was just pasted to the top portion of the gallbladder.  The upper 10 mm trocar was inserted under direct vision as high as I can get in subxiphoid area and then the lateral 5 mm 1 single trocar was used initially trying to get up as high as possible.  With him in a real steep Trendelenburg rotated to the left, I was then able to kind of tease the omentum off of the little rim of the liver edge and then could get a glimpse of the gallbladder and it was just gangrenous and edematous.  I then put in a 2nd trocar trying to elevate things but everything was so tight in center that I elected to go ahead and decompress the gallbladder, which was done with an ultrasonic aspirator and then we could actually grasp the gallbladder and retracted upward. Call for help if available was made.  Dr. Jamey [name] arrived shortly afterwards and we were able to dissect out the proximal portion of the gallbladder you can see where it has been  leaking bile really at the most proximal portion of the gallbladder could encompass the cystic duct.  We put a clip across it and then made a little opening proximal, tried to get a Reddick catheter through it and hit in something.  We then tried to do a little x-ray just to see and making sure that this was and you could see that something was in the cystic duct and  it looked like probably had about 2 cm in length of cystic duct since we were not able to identify anything very well at this point.  I then used a right angle, could get a stone that was made up of just multiple little black stones, and then when that popped out, I could put the [place] or the Reddick catheter in and held in place with clip, x-ray was obtained, and he has got kind of narrow about 2 cm cystic duct and a very long common bile and common hepatic duct, you can see the left and right bifurcations.  The catheter was removed with 3 clips across the cystic duct and then divided, and then kind of teased the gallbladder. I identified, what I think is the posterior branch that was clipped x2 proximally and then I think the branches of the cystic artery close to the gallbladder and then kind of peeling everything out, Dr. Jamey [name] has retracted everything and then the bed of the gallbladder was kind of gangrenous gallbladder and you could dissect it out by blunt dissection and then using the spatula for electrocautery.  We then irrigated thoroughly.  There were couple of raw areas of the liver bed that were coagulated with the cautery set up a little higher and then I put a piece of snow in the bed of the gallbladder.  We had placed the gallbladder actually in a bag and then we elected to go ahead and remove the gallbladder with a long 30 degrees camera in the upper port.  I then reinserted the Hasson in the umbilicus and irrigated, aspirated, coagulated a few little areas, put a 2nd little piece of Surgicel in  the gallbladder bed.  We think this is just irrigating fluid, and then put a 19 Blake drain, brought out through the most lateral 5 mm port in the subhepatic area.  We then switched the camera back up to the upper 10 mm port, withdrew the umbilical port after we had checked the position of the drains within subhepatic area and then put a figure-of-8, an 0 Vicryl in addition to the original pursestring in the umbilicus and tied both.  I then looked back in one last time with the camera, no evidence any significant bleeding.  The drain is in good position, and then withdrew the other 5 mm port.  The subcutaneous wounds were closed with benzoin and Steri-Strips, and we will keep him n.p.o., keep his Foley catheter.  We will repeat liver function studies in the a.m., and hopefully he will not have any problems with bleeding.  I will hold off on starting Lovenox at least until tomorrow and see him how much drainage we are having from the [place] drain.  He will probably go down to step-down instead of on the 3rd floor where he is at this time.     Anselm Pancoast. Zachery Dakins, M.D.     WJW/MEDQ  D:  07/06/2010  T:  07/06/2010  Job:  [id number]  cc:   Massie Maroon, MD Fax: [phone]  Electronically Signed by Consuello Bossier M.D. on 07/15/2010 11:37:11 AM

## 2010-07-17 ENCOUNTER — Telehealth: Payer: Self-pay | Admitting: Infectious Disease

## 2010-07-17 ENCOUNTER — Encounter: Payer: Self-pay | Admitting: Infectious Disease

## 2010-07-17 NOTE — Discharge Summary (Signed)
NAME:  Samuel, [name] NO.:  [id number]  MEDICAL RECORD NO.:  [id number]          PATIENT TYPE:  INP  LOCATION:  1334                         FACILITY:  [facility]  PHYSICIAN:  Samuel [name], MDDATE OF BIRTH:  1951-05-17  DATE OF ADMISSION:  07/05/2010 DATE OF DISCHARGE:  07/14/2010                              DISCHARGE SUMMARY   ADMITTING PHYSICIAN:  Vania Rea, MD  DISCHARGING PHYSICIAN:  Samuel Gosling, MD  PRIMARY CARE PHYSICIAN:  Samuel Maroon, MD  CARDIOLOGIST:  Samuel Batty, MD  CONSULTANTS: 1. Dr. Toni [name] with Orthopedics for left leg abscess. 2. Dr. Daiva [name] with Infectious Disease. 3. Dr. Consuello [name] with General Surgery. 4. Dr. Dorena [name] as well as Dr. Willis [name] with Gastroenterology.  PROCEDURES: 1. Endoscopy by Dr. Madilyn [name] on July 06, 2010. 2. Laparoscopic cholecystectomy by Dr. Consuello [name] on July 06, 2010. 3. Irrigation and debridement of left leg abscess with wound VAC     placed by Dr. Toni [name] on July 06, 2010.  REASON FOR ADMISSION:  [name] is a 60 year old male who is known to the Hospitalist Service for multiple medical problems, who has had multiple medical admissions for nausea and vomiting, who was actually just discharged from the hospital 2 days prior to readmission.  He states that since 9:30 the night prior to admission that he began having a new and different type of pain consistent with right-sided abdominal and flank pain that was 10/10.  He also developed nausea as well as emesis.  He came to the emergency department secondary to this pain. Upon arrival, he had an acute abdominal series which showed no significant findings.  He then had a CT scan of the abdomen and pelvis which revealed an abnormality of the duodenum/gallbladder interface with a question of contained ulcer versus cholecystitis with small gallstones noted.  Because of this finding, the patient was  admitted.  ADMITTING DIAGNOSES: 1. Acute abdominal pain. 2. Nausea and vomiting. 3. Diabetic gastroparesis. 4. Type 2 diabetes. 5. Gastroesophageal reflux disease. 6. Hyperlipidemia. 7. Coronary artery disease. 8. Obstructive sleep apnea. 9. Left leg abscess and cellulitis.  HOSPITAL COURSE:  At this time, the patient was admitted.  General Surgery as well as Gastroenterology were consulted.  After further discussion, the patient was felt to require an EGD to further delineate what was going on.  At this time, the patient had an upper endoscopy which revealed mild antral gastritis but otherwise a normal endoscopy with no evidence of duodenal perforation or ulcer.  Once this was seen, it was felt by the Surgical Service that the patient would need surgical management to further figure out what was going on.  Orthopedics was also consulted at this time and it was felt that when the patient went to the operating room from the General Surgery standpoint that Orthopedics will go as well and proceed with a double procedure.  In the operating room, the patient was found to have a perforated gangrenous cholecystitis.  At that time, a laparoscopic cholecystectomy was performed and a 19-French JP drain was left in  place.  Also, the patient underwent a left leg abscess and debridement with a sequestrectomy of the left tibia and placement of wound VAC by Dr. Victorino [name].  The patient tolerated these procedures well.  He was initially left intubated in the holding area; however, was able to be extubated.  Postoperatively, the patient initially did well.  He was having a little nausea on postoperative day #1 and so he was offered clear liquids.  Over the next several days, he was initially continued on a clear liquid diet. However, on postoperative day #4, the patient developed some abdominal distention with dramatic increase in his pain and nausea.  At this time, he was backed off to n.p.o. and a  2-view abdominal x-ray was obtained which was essentially negative.  The following day, the patient was feeling better but still nauseated.  Gastroenterology was asked to evaluate the patient as it was felt at this time the patient did not necessarily have a postoperative ileus but that his gastroparesis was the main cause of his nausea.  He was seen by Gastroenterology who added Zofran 8 mg b.i.d.  This began to help the patient and on postoperative day #6, he was advanced to clear liquids.  Over the next couple of days, his diet was advanced as tolerated as his nausea had completely resolved at this time.  By postoperative day #8, the patient was tolerating a regular diet with no nausea or vomiting.  His abdomen was soft.  His JP drain was discontinued at this time as it only had serosanguineous output.  His nylon sutures from his umbilicus were removed and Steri- Strips were placed as well.  Concerning his left leg abscess status post I and D, Infectious Disease was asked to evaluate the patient for medication recommendations for osteomyelitis of his left tibia.  It was felt at this time the patient would require 46 days with an IV Ancef as an outpatient to completely treat his osteomyelitis.  A wound VAC was kept on his leg and changed on Monday, Wednesday, Friday schedule.  [facility] has been consulted for his IV medications at home as well as his negative pressure wound therapy systems at home as well.  The patient's obstructive sleep apnea and diabetes were fairly stable throughout the admission.  His blood sugars were running somewhat well controlled and therefore at time of discharge, it was felt that the patient did not need his Actos or his Janumet at home and it was felt that he only needed Lantus 5 units subcutaneously b.i.d. along with his Humalog sliding scale insulin which is what the patient was on prior to admission.  DISCHARGE DIAGNOSES: 1. Acute abdominal  pain with perforated gallbladder. 2. Status post laparoscopic cholecystectomy for acute gangrenous     cholecystitis with perforation. 3. Gastroparesis. 4. Hypertension. 5. Diabetes mellitus. 6. Obstructive sleep apnea. 7. 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 8. Peripheral neuropathy. 9. Coronary artery disease. 10.Depression. 11.Gastroesophageal reflux disease. 12.Hyperlipidemia.  DISCHARGE MEDICATIONS:  Please see medication reconciliation form.  Of note, the patient has been asked to stop his Actos as well as his Janumet at home.  DISCHARGE INSTRUCTIONS:  The patient may increase his activity slowly and walk up-steps.  He may shower; however, he is not to bathe for at least the next 2 weeks.  He is not to do any heavy lifting greater than 15 pounds for the next 2 weeks.  He is not to drive for the next  1 week. He is to resume a diabetic diet.  He is to follow up with Dr. Consuello [name] at [facility] in 2-3 weeks.  He is to follow up with Dr. Victorino [name] in 2 weeks.  He is to follow up with Dr. Matthias Hughs in 2-3 weeks as he would like to move his care back here to [facility] and not at [facility].  He is to follow up with Dr. Daiva [name].  He is to follow up with Dr. Dorisann Frames, his endocrinologist.  He is to follow up with Dr. [name] and he is to call for this appointment to be seen per the hospitalist in 5-7 days with a CBC and a BMET to be ordered at that time.  He is also to follow up with Dr. Nanetta [name].  The [facility] has [facility] with [facility] for his negative pressure wound therapy system as well as his IV antibiotics which are to stop per Dr. Daiva [name] on September 04, 2010.  He will be on Ancef 2 g IV t.i.d.     Letha Cape, PA   ______________________________ Samuel Gosling, MD    KEO/MEDQ  D:  07/14/2010  T:  07/14/2010  Job:  [id number]  cc:   Anselm Pancoast. Zachery Dakins, M.D. [address] [place] [address]  Samuel Maroon, MD Fax: [phone]  Samuel Arthurs, MD Fax: [phone]  Acey Lav, MD Fax: [phone]  Dorisann Frames, M.D. Fax: [phone]  Samuel [name], M.D. Fax: [phone]  Bernette Redbird, M.D. Fax: [phone]  Electronically Signed by Barnetta Chapel PA on 07/17/2010 01:20:35 PM Electronically Signed by Emelia Loron MD on 07/17/2010 04:26:25 PM

## 2010-07-18 ENCOUNTER — Encounter: Payer: Self-pay | Admitting: Infectious Disease

## 2010-07-20 NOTE — Assessment & Plan Note (Signed)
Summary: hsfu need chart/s.aureus bacteremia  CC: hsfu Pain Assessment Patient in pain? yes     Location: left leg Intensity: 3 Type: throbbing Onset of pain  Constant Nutritional Status Detail appetite is not good per patient  Does patient need assistance? Functional Status Self care Ambulation Wheelchair   Visit Type:  Follow-up Referring Provider:  Dr. Mayra Neer Primary Provider:  [name]  CC:  hsfu.  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 He was admitted in July with cellulitis and infection surounding hardware and found to be bacteremic with MSSA. He was treated with IV ancef at dose of 1g three times a day and oral rifampin twice daily, then readmitted later in July for diabetic gastroparesis. He was planned for one month of iv ancef with fu in ID an dpresents for fu today in clinc. He continues to have a great deal of problems with nausea, and vomiting which he believes is due to his diabetic gastroparesis and his medications. He is having trouble keeping food down without antiemitecs given IV to him. His leg feels and looks better to himDr. Victorino Dike, Dr. [name] GMA  Preventive Screening-Counseling & Management  Alcohol-Tobacco     Alcohol drinks/day: 0     Smoking Status: never  Caffeine-Diet-Exercise     Caffeine use/day: 0     Does Patient Exercise: no  Safety-Violence-Falls     Seat Belt Use: yes  Problems Prior to Update: 1)  Gerd  (ICD-530.81) 2)  Diabetes, Type 1  (ICD-250.01) 3)  Allergic Rhinitis, Seasonal  (ICD-477.0) 4)  Obstructive Sleep Apnea  (ICD-327.23)  Medications Prior to Update: 1)  Actos 30 Mg Tabs (Pioglitazone Hcl) .[name].. 1 Once Daily 2)  Januvia 100 Mg Tabs (Sitagliptin Phosphate) .[name].. 1 Once Daily 3)  Lyrica 75 Mg Caps (Pregabalin) .... As Needed During The Day 4)   Lyrica 300 Mg Caps (Pregabalin) .[name].. 1 At Bedtime 5)  Cymbalta 60 Mg Cpep (Duloxetine Hcl) .[name].. 1 Once Daily 6)  Aspirin Buffered 325 Mg Tabs (Aspirin Buff(Mgcarb-Alaminoac)) .... As Needed 7)  [facility] Aspirin 81 Mg Tbec (Aspirin) .[name].. 1 Once Daily 8)  Pantoprazole Sodium 40 Mg Tbec (Pantoprazole Sodium) .[name].. 1 Two Times A Day 9)  Lantus 100 Unit/ml Soln (Insulin Glargine) .... 70 Units in Am 10)  Simvastatin 20 Mg Tabs (Simvastatin) .[name].. 1 At Bedtime 11)  Eq Pain Reliever Pm 25-500 Mg Tabs (Diphenhydramine-Apap (Sleep)) .... 2 At Bedtime 12)  Cerefolin Nac 5.6-2-600 Mg Tabs (Methylfol-Methylcob-Acetylcyst) .[name].. 1 At Bedtime 13)  Metanx 2.8-25-2 Mg Tabs (L-Methylfolate-B6-B12) .[name].. 1 Two Times A Day 14)  Metformin Hcl 500 Mg Tabs (Metformin Hcl) .... 2 Two Times A Day 15)  Quinapril Hcl 5 Mg Tabs (Quinapril Hcl) .[name].. 1 Once Daily 16)  B-50 Complex/vitamin C  Tabs (B Complex-C) .[name].. 1 Two Times A Day 17)  Cvs Arthritis Pain Relief 650 Mg Cr-Tabs (Acetaminophen) .[name].. 1 Every 8 Hours As Needed 18)  Humalog 100 Unit/ml Soln (Insulin Lispro (Human)) .... Sliding Scale Before Each Meal 19)  Cpap 13  Current Medications (verified): 1)  Actos 30 Mg Tabs (Pioglitazone Hcl) .[name].. 1 Once Daily 2)  Januvia 100 Mg Tabs (Sitagliptin Phosphate) .[name].. 1 Once Daily 3)  Lyrica 75 Mg Caps (Pregabalin) .... As Needed During The Day 4)  Lyrica 300 Mg Caps (Pregabalin) .Marland KitchenMarland KitchenMarland Kitchen  1 At Bedtime 5)  Cymbalta 60 Mg Cpep (Duloxetine Hcl) .[name].. 1 Once Daily 6)  Aspirin Buffered 325 Mg Tabs (Aspirin Buff(Mgcarb-Alaminoac)) .... As Needed 7)  [facility] Aspirin 81 Mg Tbec (Aspirin) .[name].. 1 Once Daily 8)  Pantoprazole Sodium 40 Mg Tbec (Pantoprazole Sodium) .[name].. 1 Two Times A Day 9)  Lantus 100 Unit/ml Soln (Insulin Glargine) .... 70 Units in Am 10)  Simvastatin 20 Mg Tabs (Simvastatin) .[name].. 1 At Bedtime 11)  Eq Pain Reliever Pm 25-500 Mg Tabs (Diphenhydramine-Apap (Sleep)) .... 2 At Bedtime 12)  Cerefolin Nac 5.6-2-600 Mg  Tabs (Methylfol-Methylcob-Acetylcyst) .[name].. 1 At Bedtime 13)  Metanx 2.8-25-2 Mg Tabs (L-Methylfolate-B6-B12) .[name].. 1 Two Times A Day 14)  Metformin Hcl 500 Mg Tabs (Metformin Hcl) .... 2 Two Times A Day 15)  Quinapril Hcl 5 Mg Tabs (Quinapril Hcl) .[name].. 1 Once Daily 16)  B-50 Complex/vitamin C  Tabs (B Complex-C) .[name].. 1 Two Times A Day 17)  Cvs Arthritis Pain Relief 650 Mg Cr-Tabs (Acetaminophen) .[name].. 1 Every 8 Hours As Needed 18)  Humalog 100 Unit/ml Soln (Insulin Lispro (Human)) .... Sliding Scale Before Each Meal 19)  Cpap 13  Allergies: 1)  ! Neurontin 2)  ! Relafen 3)  Codeine  Past History:  Past Medical History: ALLERGIC RHINITIS, SEASONAL (ICD-477.0) OBSTRUCTIVE SLEEP APNEA (ICD-327.23)- NPSG 01/24/01 AHI 30/hr, AUTOTITR 05/26/08- 13 cwp. Diabetes, Type 1-Dr. 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  The patient denies fever, weight gain, vision loss, decreased hearing, hoarseness, chest pain, syncope, dyspnea on exertion, peripheral edema, prolonged cough, headaches, hemoptysis, melena, hematochezia, severe indigestion/heartburn, hematuria, incontinence, genital sores, muscle weakness, suspicious skin lesions, transient blindness, difficulty walking, depression, unusual weight change, abnormal bleeding, and enlarged lymph nodes.    Physical Exam  General:  alert and well-developed.   Head:  normocephalic, atraumatic, and no abnormalities observed.   Eyes:  vision grossly intact, pupils equal, and pupils round.   Ears:  no external deformities.   Nose:  no external deformity.   Mouth:  no erythema and no exudates.   Neck:  supple and full ROM.   Lungs:  normal respiratory effort, no  intercostal retractions, no accessory muscle use, no crackles, and no wheezes.   Heart:  normal rate, regular rhythm, no murmur, and no gallop.   Abdomen:  soft.  some tenderness and hyperacgtive bs in lower quadrants Msk:  see skin Extremities:  see skin Neurologic:  alert & oriented X3 and sensation intact to pinprick.   Skin:  he has erythema surrounding several of the insertion sites of his hardware but no area of fluctuance or active drainage Psych:  Oriented X3, memory intact for recent and remote, and normally interactive.     Impression & Recommendations:  Problem # 1:  METHICILLIN SUSCEPTIBLE STAPH AUREUS SEPTICEMIA (ICD-038.11)  I would like to extend his IV antibiotic y furhter and give him a more aggressive dose of 2 g of ancef three times a day (provvidd his renal funciton is ok) and continue this for another 4 wks. We will continue rifampin given the hardware that is present  Orders: Est. Patient Level IV (04540)  Problem # 2:  ACUTE OSTEOMYELITIS, ANKLE AND FOOT (ICD-730.07)  Continue  iv ancef and oral rifampin, I plan on switching him to oral keflex plus rifampin once the IV course is finished and continuing these until his hardware is removed. Will check esr and crp today as well His updated medication list for this problem includes:    Aspirin Buffered 325 Mg Tabs (Aspirin buff(mgcarb-alaminoac)) .[name]... As needed    [facility]om[facility] Longs Aspirin 81 Mg Tbec (Aspirin) .[name]... 1 once daily    Cvs Arthritis Pain Relief 650 Mg Cr-tabs (Acetaminophen) .[name]... 1 every 8 hours as needed  Orders: Est. Patient Level IV (16109)  Problem # 3:  NAUSEA AND VOMITING (ICD-787.01)  Mixture of diabetic gastroparesis and possible SE of rifampin. I may eventually have to take him off the rifampin if he continues to vomit, to see if this will ameliorate the nausea nad vomitng. Asked [facility] to send me his labs from Monday  Orders: Est. Patient Level IV (60454)  Patient Instructions: 1)  I would like to  increase your dose of ancef to 2 grams iv three times a day and continue this thru midseptember when your hardware is to be removed 2)  Continue the rifampin 300mg  twice daily 3)  Take zofran before your rifampin doses 4)  Rtc in mid september 14th

## 2010-07-20 NOTE — Assessment & Plan Note (Signed)
Summary: F/U OV/VS   Visit Type:  Follow-up Referring Provider:  Dr. Mayra Neer Primary Provider:  [name]  CC:  hsfu .  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 He was admitted in July with cellulitis and infection surounding hardware and found to be bacteremic with MSSA. He was treated with IV ancef at dose of 1g three times a day and oral rifampin twice daily, then readmitted later in July for diabetic gastroparesis. I saw him last month and changed him to higher dose ancef at 2g iv three times a day. He is due to see orthopedics on Friday for removal of hardware from left leg. He has been bandaging right leg to protect from hardware  from left leg causing injury and ulcartion to right leg  Problems Prior to Update: 1)  Nausea and Vomiting  (ICD-787.01) 2)  Acute Osteomyelitis, Ankle and Foot  (ICD-730.07) 3)  Methicillin Susceptible Staph Aureus Septicemia  (ICD-038.11) 4)  Gerd  (ICD-530.81) 5)  Diabetes, Type 1  (ICD-250.01) 6)  Allergic Rhinitis, Seasonal  (ICD-477.0) 7)  Obstructive Sleep Apnea  (ICD-327.23)  Medications Prior to Update: 1)  Actos 30 Mg Tabs (Pioglitazone Hcl) .[name].. 1 Once Daily 2)  Januvia 100 Mg Tabs (Sitagliptin Phosphate) .[name].. 1 Once Daily 3)  Lyrica 75 Mg Caps (Pregabalin) .... As Needed During The Day 4)  Lyrica 300 Mg Caps (Pregabalin) .[name].. 1 At Bedtime 5)  Cymbalta 60 Mg Cpep (Duloxetine Hcl) .[name].. 1 Once Daily 6)  Aspirin Buffered 325 Mg Tabs (Aspirin Buff(Mgcarb-Alaminoac)) .... As Needed 7)  [facility] Aspirin 81 Mg Tbec (Aspirin) .[name].. 1 Once Daily 8)  Pantoprazole Sodium 40 Mg Tbec (Pantoprazole Sodium) .[name].. 1 Two Times A Day 9)  Lantus 100 Unit/ml Soln (Insulin Glargine) .... 70 Units in Am 10)  Simvastatin 20 Mg Tabs (Simvastatin) .[name].. 1 At Bedtime 11)  Eq Pain Reliever Pm 25-500 Mg  Tabs (Diphenhydramine-Apap (Sleep)) .... 2 At Bedtime 12)  Cerefolin Nac 5.6-2-600 Mg Tabs (Methylfol-Methylcob-Acetylcyst) .[name].. 1 At Bedtime 13)  Metanx 2.8-25-2 Mg Tabs (L-Methylfolate-B6-B12) .[name].. 1 Two Times A Day 14)  Metformin Hcl 500 Mg Tabs (Metformin Hcl) .... 2 Two Times A Day 15)  Quinapril Hcl 5 Mg Tabs (Quinapril Hcl) .[name].. 1 Once Daily 16)  B-50 Complex/vitamin C  Tabs (B Complex-C) .[name].. 1 Two Times A Day 17)  Cvs Arthritis Pain Relief 650 Mg Cr-Tabs (Acetaminophen) .[name].. 1 Every 8 Hours As Needed 18)  Humalog 100 Unit/ml Soln (Insulin Lispro (Human)) .... Sliding Scale Before Each Meal 19)  Cpap 13  Current Medications (verified): 1)  Actos 30 Mg Tabs (Pioglitazone Hcl) .[name].. 1 Once Daily 2)  Januvia 100 Mg Tabs (Sitagliptin Phosphate) .[name].. 1 Once Daily 3)  Lyrica 75 Mg Caps (Pregabalin) .... As Needed During The Day 4)  Lyrica 300 Mg Caps (Pregabalin) .[name].. 1 At Bedtime 5)  Cymbalta 60 Mg Cpep (Duloxetine Hcl) .[name].. 1 Once Daily 6)  Aspirin Buffered 325 Mg Tabs (Aspirin Buff(Mgcarb-Alaminoac)) .... As Needed 7)  [facility] Aspirin 81 Mg Tbec (Aspirin) .[name].. 1 Once Daily 8)  Pantoprazole Sodium 40 Mg Tbec (Pantoprazole Sodium) .[name].. 1 Two Times A Day 9)  Levemir 100 Unit/ml Soln (Insulin Detemir) .... Use As Directed 10)  Simvastatin 20 Mg Tabs (Simvastatin) .[name].. 1 At Bedtime 11)  Eq Pain Reliever Pm 25-500  Mg Tabs (Diphenhydramine-Apap (Sleep)) .... 2 At Bedtime 12)  Cerefolin Nac 5.6-2-600 Mg Tabs (Methylfol-Methylcob-Acetylcyst) .[name].. 1 At Bedtime 13)  Metanx 2.8-25-2 Mg Tabs (L-Methylfolate-B6-B12) .[name].. 1 Two Times A Day 14)  Metformin Hcl 500 Mg Tabs (Metformin Hcl) .... 2 Two Times A Day 15)  Quinapril Hcl 5 Mg Tabs (Quinapril Hcl) .[name].. 1 Once Daily 16)  B-50 Complex/vitamin C  Tabs (B Complex-C) .[name].. 1 Two Times A Day 17)  Cvs Arthritis Pain Relief 650 Mg Cr-Tabs (Acetaminophen) .[name].. 1 Every 8 Hours As Needed 18)  Humalog 100 Unit/ml Soln (Insulin Lispro (Human)) .... Sliding  Scale Before Each Meal 19)  Cpap 13 20)  Lunesta 3 Mg Tabs (Eszopiclone) .[name].. 1 At Bedtime 21)  Keflex 500 Mg Caps (Cephalexin) .... Take Two Tablets Twice Daily  Allergies: 1)  ! Neurontin 2)  ! Relafen 3)  Codeine    Current Allergies (reviewed today): ! NEURONTIN ! RELAFEN CODEINE Past History:  Past Medical History: Last updated: 01/25/2010 ALLERGIC RHINITIS, SEASONAL (ICD-477.0) OBSTRUCTIVE SLEEP APNEA (ICD-327.23)- NPSG 01/24/01 AHI 30/hr, AUTOTITR 05/26/08- 13 cwp. Diabetes, Type 1-Dr. 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  Risk Factors: Alcohol Use: 0 (01/25/2010) Caffeine Use: 0 (01/25/2010) Exercise: no (01/25/2010)  Risk Factors: Smoking Status: never (01/25/2010)  Review of Systems       The patient complains of suspicious skin lesions.  The patient denies anorexia, fever, weight loss, weight gain, vision loss, decreased hearing, hoarseness, chest pain, syncope, dyspnea on exertion, peripheral edema, prolonged cough, headaches, hemoptysis, abdominal pain, melena, hematochezia, severe indigestion/heartburn, hematuria, incontinence, genital sores, muscle weakness, transient blindness, difficulty walking, depression, unusual weight change, abnormal bleeding, enlarged lymph nodes, and angioedema.    Vital Signs:  Patient profile:   60 year old male Pulse rate:   96 / minute BP sitting:   108 / 72  (left arm) Cuff size:   regular  Vitals Entered By: Starleen Arms CMA (March 01, 2010 10:29 AM) CC: hsfu  Is Patient Diabetic? Yes Did you bring your meter with you today? No Pain Assessment Patient in pain? no       Does patient need assistance? Functional Status Self care Ambulation Impaired:Risk for fall   Physical Exam  General:  alert and well-developed.   Head:  normocephalic, atraumatic, and no abnormalities observed.   Eyes:  vision grossly intact, pupils equal, and pupils round.   Ears:  no external deformities.   Nose:  no external deformity.   Mouth:  no erythema and no exudates.   Lungs:  normal respiratory effort, no intercostal retractions, no accessory muscle use, no crackles, and no wheezes.   Heart:  normal rate, regular rhythm, no murmur, and no gallop.   Msk:  see skin Neurologic:  alert & oriented X3 and sensation intact to pinprick.   Skin:  his right leg with sevaral areas of ulceration from trauma that he has been appropriately protecitng with guaze now. His left leg with minimalerythema surrounding several of the insertion sites of his hardware but no area of fluctuance or active  drainage Psych:  Oriented X3, memory intact for recent and remote, and normally interactive.     Impression & Recommendations:  Problem # 1:  ACUTE OSTEOMYELITIS, ANKLE AND FOOT (ICD-730.07) Assessment Improved  Continue ancef and rifampin till hardware out, then ancef till Monday, then keflex for month therafeter alone His updated medication list for this problem includes:    Aspirin Buffered 325 Mg Tabs (Aspirin buff(mgcarb-alaminoac)) .[name]... As needed    [facility] Jomarie Longs Aspirin 81 Mg Tbec (Aspirin) .[name]... 1 once daily    Cvs Arthritis Pain Relief 650 Mg Cr-tabs (Acetaminophen) .[name]... 1 every 8 hours as needed    Keflex 500 Mg Caps (Cephalexin) .[name]... Take two tablets twice daily  Orders: Est. Patient Level IV (78295)  Problem # 2:  ULCER OF LOWER LIMB, UNSPECIFIED (ICD-707.10)  due to Trauam from  external fix from left. He is  appropriately bandgaing His updated medication list for this problem includes:    Aspirin Buffered 325 Mg Tabs (Aspirin buff(mgcarb-alaminoac)) .[name]... As needed    [facility] Jomarie Longs Aspirin 81 Mg Tbec (Aspirin) .[name]... 1 once daily    Cvs Arthritis Pain Relief 650 Mg Cr-tabs (Acetaminophen) .[name]... 1 every 8 hours as needed    Keflex 500 Mg Caps (Cephalexin) .[name]... Take two tablets twice daily  Orders: Est. Patient Level IV (62130)  Problem # 3:  METHICILLIN SUSCEPTIBLE STAPH AUREUS SEPTICEMIA (ICD-038.11)  resolved, pt still taking ancef, more than adquetley rx the bacteremia  Orders: Est. Patient Level IV ([id number])  Problem # 4:  NAUSEA AND VOMITING (ICD-787.01)  controlled with anitemetics. Stopping the rifampin should help this.  Orders: Est. Patient Level IV (46962)  Medications Added to Medication List This Visit: 1)  Levemir 100 Unit/ml Soln (Insulin detemir) .... Use as directed 2)  Lunesta 3 Mg Tabs (Eszopiclone) .[name].. 1 at bedtime 3)  Keflex 500 Mg Caps (Cephalexin) .... Take two tablets twice daily  Patient Instructions: 1)  We will rewarp dressing on right leg 2)  continue the cefazolin IV thru next monday, then will have [facility] stop iv antibiotics and pull the picc line 3)  Continue the rifampin until the hardware is removed 4)  Next monday start keflex two tablets twice daily  5)  and rtc to see Dr. Daiva Eves in early October Prescriptions: KEFLEX 500 MG CAPS (CEPHALEXIN) take two tablets twice daily  #120 x 3   Entered and Authorized by:   Acey Lav MD   Signed by:   Paulette Blanch Dam MD on 03/01/2010   Method used:   Electronically to        [facility] (retail)       [address]       [place], [place]  [id number]       Ph: [phone]       Fax: [phone]   RxID:   [id number]

## 2010-07-20 NOTE — Letter (Signed)
Summary: [facility]   Imported By: Lester Jarratt 09/23/2009 10:18:11  _____________________________________________________________________  External Attachment:    Type:   Image     Comment:   External Document

## 2010-07-20 NOTE — Procedures (Signed)
Summary: Esophageal [facility]  Esophageal [facility]   Imported By: Sherian Rein 11/21/2009 10:32:42  _____________________________________________________________________  External Attachment:    Type:   Image     Comment:   External Document

## 2010-07-20 NOTE — Miscellaneous (Signed)
Summary: HIPAA Restrictions  HIPAA Restrictions   Imported By: Florinda Marker 01/26/2010 09:31:49  _____________________________________________________________________  External Attachment:    Type:   Image     Comment:   External Document

## 2010-07-20 NOTE — Miscellaneous (Signed)
Summary: Home Care Report: Orders  Home Care Report: Orders   Imported By: Florinda Marker 04/03/2010 15:40:29  _____________________________________________________________________  External Attachment:    Type:   Image     Comment:   External Document

## 2010-07-20 NOTE — Assessment & Plan Note (Signed)
Summary: f/u ov/vs   Visit Type:  Follow-up Referring Eddith Mentor:  Dr. Mayra Neer Primary Ruben Pyka:  [name]  CC:  follow-up visit.  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 He was admitted in July with cellulitis and infection surounding hardware and found to be bacteremic with MSSA. He was treated with IV ancef at dose of 1g three times a day and oral rifampin twice daily, then readmitted later in July for diabetic gastroparesis. I saw him last month and changed him to higher dose ancef at 2g iv three times a day. He is due to saw Dr. Dyanne Iha nearly a month ago, and finished IV ancef plus rifampin switching to oral keflex alone. He had some intiial drainage of wounds when hardware removed but he says they are now healing quite well> He has hard cast covering his left leg. He is without fevers, chills. Nausea has improved substantially since rifampin was stopped. His leg only hurts when bearing weight which he does minimally with crutches being used.  Problems Prior to Update: 1)  Ulcer of Lower Limb, Unspecified  (ICD-707.10) 2)  Nausea and Vomiting  (ICD-787.01) 3)  Acute Osteomyelitis, Ankle and Foot  (ICD-730.07) 4)  Methicillin Susceptible Staph Aureus Septicemia  (ICD-038.11) 5)  Gerd  (ICD-530.81) 6)  Diabetes, Type 1  (ICD-250.01) 7)  Allergic Rhinitis, Seasonal  (ICD-477.0) 8)  Obstructive Sleep Apnea  (ICD-327.23)  Medications Prior to Update: 1)  Actos 30 Mg Tabs (Pioglitazone Hcl) .[name].. 1 Once Daily 2)  Januvia 100 Mg Tabs (Sitagliptin Phosphate) .[name].. 1 Once Daily 3)  Lyrica 75 Mg Caps (Pregabalin) .... As Needed During The Day 4)  Lyrica 300 Mg Caps (Pregabalin) .[name].. 1 At Bedtime 5)  Cymbalta 60 Mg Cpep (Duloxetine Hcl) .[name].. 1 Once Daily 6)  Aspirin Buffered 325 Mg Tabs (Aspirin Buff(Mgcarb-Alaminoac)) .... As  Needed 7)  [facility] Aspirin 81 Mg Tbec (Aspirin) .[name].. 1 Once Daily 8)  Pantoprazole Sodium 40 Mg Tbec (Pantoprazole Sodium) .[name].. 1 Two Times A Day 9)  Levemir 100 Unit/ml Soln (Insulin Detemir) .... Use As Directed 10)  Simvastatin 20 Mg Tabs (Simvastatin) .[name].. 1 At Bedtime 11)  Eq Pain Reliever Pm 25-500 Mg Tabs (Diphenhydramine-Apap (Sleep)) .... 2 At Bedtime 12)  Cerefolin Nac 5.6-2-600 Mg Tabs (Methylfol-Methylcob-Acetylcyst) .[name].. 1 At Bedtime 13)  Metanx 2.8-25-2 Mg Tabs (L-Methylfolate-B6-B12) .[name].. 1 Two Times A Day 14)  Metformin Hcl 500 Mg Tabs (Metformin Hcl) .... 2 Two Times A Day 15)  Quinapril Hcl 5 Mg Tabs (Quinapril Hcl) .[name].. 1 Once Daily 16)  B-50 Complex/vitamin C  Tabs (B Complex-C) .[name].. 1 Two Times A Day 17)  Cvs Arthritis Pain Relief 650 Mg Cr-Tabs (Acetaminophen) .[name].. 1 Every 8 Hours As Needed 18)  Humalog 100 Unit/ml Soln (Insulin Lispro (Human)) .... Sliding Scale Before Each Meal 19)  Cpap 13 20)  Lunesta 3 Mg Tabs (Eszopiclone) .[name].. 1 At Bedtime 21)  Keflex 500 Mg Caps (Cephalexin) .... Take Two Tablets Twice Daily  Current Medications (verified): 1)  Actos 30 Mg Tabs (Pioglitazone Hcl) .[name].. 1 Once Daily 2)  Januvia 100 Mg Tabs (Sitagliptin Phosphate) .[name].. 1 Once Daily 3)  Lyrica 75 Mg Caps (Pregabalin) .... As Needed During The Day 4)  Lyrica 300 Mg Caps (Pregabalin) .[name].. 1 At Bedtime 5)  Cymbalta 60 Mg Cpep (Duloxetine Hcl) .Marland KitchenMarland KitchenMarland Kitchen  1 Once Daily 6)  Aspirin Buffered 325 Mg Tabs (Aspirin Buff(Mgcarb-Alaminoac)) .... As Needed 7)  [facility] Aspirin 81 Mg Tbec (Aspirin) .[name].. 1 Once Daily 8)  Pantoprazole Sodium 40 Mg Tbec (Pantoprazole Sodium) .[name].. 1 Two Times A Day 9)  Levemir 100 Unit/ml Soln (Insulin Detemir) .... Use As Directed 10)  Simvastatin 20 Mg Tabs (Simvastatin) .[name].. 1 At Bedtime 11)  Eq Pain Reliever Pm 25-500 Mg Tabs (Diphenhydramine-Apap (Sleep)) .... 2 At Bedtime 12)  Cerefolin Nac 5.6-2-600 Mg Tabs (Methylfol-Methylcob-Acetylcyst) .[name].. 1 At  Bedtime 13)  Metanx 2.8-25-2 Mg Tabs (L-Methylfolate-B6-B12) .[name].. 1 Two Times A Day 14)  Metformin Hcl 500 Mg Tabs (Metformin Hcl) .... 2 Two Times A Day 15)  Quinapril Hcl 5 Mg Tabs (Quinapril Hcl) .[name].. 1 Once Daily 16)  B-50 Complex/vitamin C  Tabs (B Complex-C) .[name].. 1 Two Times A Day 17)  Cvs Arthritis Pain Relief 650 Mg Cr-Tabs (Acetaminophen) .[name].. 1 Every 8 Hours As Needed 18)  Humalog 100 Unit/ml Soln (Insulin Lispro (Human)) .... Sliding Scale Before Each Meal 19)  Cpap 13 20)  Lunesta 3 Mg Tabs (Eszopiclone) .[name].. 1 At Bedtime 21)  Keflex 500 Mg Caps (Cephalexin) .... Take Two Tablets Twice Daily  Allergies: 1)  ! Neurontin 2)  ! Relafen 3)  Codeine   Preventive Screening-Counseling & Management  Alcohol-Tobacco     Alcohol drinks/day: 0     Smoking Status: never  Caffeine-Diet-Exercise     Caffeine use/day: 0     Does Patient Exercise: no  Safety-Violence-Falls     Seat Belt Use: yes   Current Allergies (reviewed today): ! NEURONTIN ! RELAFEN CODEINE Family History: Reviewed history from 05/26/2008 and no changes required. Heart dz- Mother Lung CA- Mother- died COPD and lung Ca- our patient  Social History: Reviewed history from 04/30/2008 and no changes required. 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  Review of Systems  The patient denies anorexia, fever, weight loss, weight gain, vision loss, decreased hearing, hoarseness, chest pain, syncope, dyspnea on exertion, peripheral edema, prolonged cough, headaches, hemoptysis, abdominal pain, melena, hematochezia, severe indigestion/heartburn, hematuria, incontinence, genital sores, muscle weakness, suspicious skin lesions, transient blindness, difficulty walking, depression, unusual weight change, abnormal bleeding, and enlarged lymph nodes.    Vital Signs:  Patient profile:   61 year old male Height:      75 inches (190.50  cm) Weight:      285.4 pounds (129.73 kg) BMI:     35.80 Pulse rate:   82 / minute BP sitting:   122 / 74  (right arm)  Vitals Entered By: Baxter Hire) (March 28, 2010 9:09 AM) CC: follow-up visit Pain Assessment Patient in pain? no      Nutritional Status BMI of > 30 = obese Nutritional Status Detail appetite is good per patient  Does patient need assistance? Functional Status Self care Ambulation Normal Comments Walking on crutches at this time   Physical Exam  General:  alert and well-developed.   Head:  normocephalic, atraumatic, and no abnormalities observed.   Eyes:  vision grossly intact, pupils equal, and pupils round.   Ears:  no external deformities.   Nose:  no external deformity.   Mouth:  no erythema and no exudates.   Neck:  supple and full ROM.   Lungs:  normal respiratory effort, no intercostal retractions, no accessory muscle use, no crackles, and no wheezes.   Heart:  normal rate,  regular rhythm, no murmur, and no gallop.   Abdomen:  soft.  some tenderness and hyperacgtive bs in lower quadrants Msk:  see skin Neurologic:  alert & oriented X3 and sensation intact to pinprick.   Skin:  left leg with hard cast. right leg soft tissue healing well. Psych:  Oriented X3, memory intact for recent and remote, and normally interactive.     Impression & Recommendations:  Problem # 1:  ACUTE OSTEOMYELITIS, ANKLE AND FOOT (ICD-730.07)  finishing his oral keflex this week. WIlll brin gback for followup appt in one month. He will be followed lcosely by Dr. Dyanne Iha His updated medication list for this problem includes:    Aspirin Buffered 325 Mg Tabs (Aspirin buff(mgcarb-alaminoac)) .[name]... As needed    [facility] Aspirin 81 Mg Tbec (Aspirin) .[name]... 1 once daily    Cvs Arthritis Pain Relief 650 Mg Cr-tabs (Acetaminophen) .[name]... 1 every 8 hours as needed    Keflex 500 Mg Caps (Cephalexin) .[name]... Take two tablets twice daily  Orders: Est. Patient Level IV  (04540)  Problem # 2:  METHICILLIN SUSCEPTIBLE STAPH AUREUS SEPTICEMIA (ICD-038.11)  see above discussion  Orders: Est. Patient Level IV (98119)  Problem # 3:  NAUSEA AND VOMITING (ICD-787.01)  inmimproved off of rifampin  Orders: Est. Patient Level IV (14782)  Patient Instructions: 1)  rtc to see Dr. Daiva Eves in one month

## 2010-07-22 NOTE — Consult Note (Signed)
NAMEMarland [name]  FILMORE, MOLYNEUX NO.:  [id number]  MEDICAL RECORD NO.:  [id number]          PATIENT TYPE:  INP  LOCATION:  1225                         FACILITY:  [facility]  PHYSICIAN:  Acey Lav, MD  DATE OF BIRTH:  19-Sep-1950  DATE OF CONSULTATION: DATE OF DISCHARGE:                                CONSULTATION   REFERRING PHYSICIAN:  Toni Arthurs, MD, Orthopedic Surgery.  REASON FOR CONSULTATION:  Reason for infectious disease consultation include left leg abscess with osteomyelitis at site of pin site as well as gangrenous cholecystitis status post cholecystectomy.  HISTORY OF PRESENT ILLNESS:  [name] is a 60 year old Caucasian male with multiple medical problems including diabetes mellitus, Charcot joint foot deformity which he underwent surgery for with left midfoot osteotomy and lengthening of the Achilles tendon on June 15th.  He then admitted in July with inflammation, cellulitis and abscess around the hardware, found be bacteremic with methicillin-sensitive staph aureus. He was treated with intravenous Ancef at a dose of 1 g 3 times daily or rifampin twice daily and then seen by me in followup in the infection disease clinic on August 10.  At that time, I extended his therapy and increased his dose to 2 g IV 3 times daily and extended this for an additional 4 weeks with oral rifampin.  The patient still has hardware present at this time.  The patient was then continued on antibiotics with cefazolin and rifampin, seen by me in followup in September, then the cefazolin was continued through surgery where he had the pin removed a few days later and then he was changed to high-dose oral Keflex.  I then saw him in followup on October 11.  He was finishing up his course of Keflex and seemed to be doing well.  I had in the meantime taken him off rifampin due to his problems with nausea and vomiting.  He was scheduled to come see me again in November.  The  patient appears to have made the follow-up appointment.  He then was admitted on multiple occasions including late December and January for abdominal pain.  This was thought to be diabetic gastroparesis.  In the interim, he then developed multiple problems.  His left lower extremity developed swelling and erythema and he developed a large abscess on his left lower extremity.  He was admitted to hospitalist service on July 05, 2010, with severe abdominal pain, nausea, vomiting.  The patient was admitted the hospitalist service.  He was found to have severe abdominal pain and leukocytosis on admission.  He had been scheduled to have I and D of his left leg abscess but he had not gone to the surgical appointment due to his severe abdominal pain.  In the interim, he had a CT scan of the abdomen and pelvis which showed fluid around the duodenum which was concerning for possible perforation of an ulcer versus complication related to cholecystitis.  He was seen by General Surgery as well as Gastroenterology.  Ultimately, the patient was taken to the operating room yesterday where he underwent multiple surgeries.  He had an EGD that  showed mild antral gastritis but no evidence of duodenal perforation.  He then underwent orthopedic surgery with incision and debridement of left leg subcutaneous abscess and removal of sequestration of infection in the left tibia.  These were sent for culture on antibiotics, namely vancomycin and Zosyn.  He then underwent surgery by General Surgery with exploratory laparotomy.  He was found to have a perforated gallbladder with gangrenous cholecystitis with stones. The gallbladder was removed  and cultures obtained.  He was sent to the to the intensive care unit, remains currently on vancomycin and Zosyn. We are consulted to assist in the management of this patient with multiple medical problems and multiple infectious disease issues including infection of his  tibia as well as his perforated gallbladder.  PAST MEDICAL HISTORY: 1. Methicillin-sensitive staph aureus bacteremia back in July     associated with abscess around the pin site.  This was treated     grossly with intravenous antibiotics as described above. 2. Diabetes mellitus. 3. Gastroparesis. 4. Peripheral neuropathy. 5. Coronary artery disease status post drug-eluting stent in December     2010. 6. Hypertension. 7. Left lower extremity Charcot joint status post surgery described     above. 8. Right great toe amputation in 2010. 9. Hyperlipidemia. 10.Obstructive sleep apnea.  PAST SURGICAL HISTORY:  Surgeries described above during this admission. He also had removal of wire and external fixator under anesthesia in February 22, 2010.  He had left foot osteotomy and left Lisfranc joint arthrodesis and left tendo-Achilles tendon lengthening, application of multiple external fixator on November 30, 2009.  Prior to this, he had an EGD in May 2011 and a partial amputation of right great toe through the diaphyseal portion in May 2010 due to osteomyelitis.  FAMILY HISTORY:  Positive for diabetes, COPD, and coronary artery disease.  The patient denies any tobacco, alcohol or drug use.  REVIEW OF SYSTEMS:  Described above in history of present illness and by primary team's notes.  Otherwise, negative on 12-point review.  ALLERGIES:  CODEINE, GABAPENTIN and RELAFEN.  PHYSICAL EXAMINATION:  VITAL SIGNS:  Temperature maximum since admission to the intensive care unit is 98.7, temperature current 98.4, blood pressure 146/73, pulse is 100, sinus tachycardia, respirations 11, pulse ox 97% on 2 L via nasal cannula. GENERAL:  The patient is diaphoretic and uncomfortable.  He is dry heaving and suffering from severe nausea.  It was difficult to take a history from him.  He told me he could not remember multiple things and I told him I would come back and talk to him later more extensively  when he felt better.  On exam, he was, as mentioned, diaphoretic.  He was leaning over, heaving. CARDIOVASCULAR:  His cardiovascular exam was tachycardic. LUNGS:  Relatively clear to auscultation. ABDOMEN:  His abdomen had dressings over surgical site of cholecystectomy. EXTREMITIES:  His left lower extremity had a vacuum dressing applied.  LABORATORY DATA: 1. Chest x-ray done on July 06, 2010, shows low-volume chest     radiograph with endotracheal tube in place. 2. Cholangiogram on July 06, 2010, showed normal intraoperative     cholangiogram. 3. CT of abdomen and pelvis on July 06, 2010, had shown no evidence     of free air in abdomen, slight interval increase in soft tissue     edema around the duodenum and gallbladder. 4. He had plain film of the tibia and fibula, shows no radiographic     evidence of osteomyelitis or postsurgical defects related to  external fixators. 5. CT of abdomen and pelvis on July 05, 2010, had shown question of     perforated ulcer with his cholecystitis given the fluid around the     duodenum, some left base consolidation with some small     calcifications in the lungs seen, he had multiple coronary artery     calcifications seen as well.  Further laboratory data:  CBC, differential today, white count 1.7, hemoglobin 10.4, platelets 181.  Comprehensive metabolic panel; sodium 138, potassium 4.4, chloride 106, bicarb 26, BUN and creatinine of 20 and 1.11, glucose 247, bilirubin 0.9, alk phos 60, AST and ALT of 36 and 41, albumin was 2.5.  MRSA by PCR screening on vancomycin was negative, so the utility of this is questionable.  Further microbiological data; cultured tissue from left tibia on vancomycin, Zosyn, no growth at 1 day.  Anaerobic culture from tissue in left tibia had no growth at 1 day.  Urine culture on July 05, 2010, no growth at 1 day.  Urine cultures, June 30, 2010, no growth.  Urine culture, January 14, 2010, no  growth.  Blood cultures on July 14, no growth.  Urine culture, July 18, no growth.  Blood culture from July 10, 2 out of 2 methicillin-sensitive staph aureus sensitive to clindamycin, erythromycin, gentamicin, levofloxacin, oxacillin, penicillin; resistant to rifampin, tetracycline, vancomycin and Bactrim sensitive.  Prior urine culture with Proteus mirabilis in July which was sensitive to every antibiotic except for Macrobid.  Urine cultures on July 6 and May 12, no growth.  C diff toxins in May, negative.  Blood cultures in April 2010, no growth.  IMPRESSION AND RECOMMENDATIONS:  This is a complicated 60 year old gentleman known to me before for his methicillin-sensitive staph aureus bacteremia and abscess associated with his pin site infection and osteomyelitis at his hardware site who now presents with perforated gallbladder and reaccumulation of abscess at his site where his pain had been removed. 1. Perforated gallbladder.  The patient's current antimicrobial     coverage with Zosyn is quite adequate to cover him for intra-     abdominal pathogens and I think it is reasonable to continue this     for now.  I doubt that he has ampicillin-resistant enterococcus     playing a significant pathology where his gallbladder was and with     this peritonitis but vancomycin certainly would cover such an     organism.  We will follow him closely for this and adjust     antibiotics to cover both this and site of his tibial infection. 2. Left tibial infection.  This site is likely actually recurrence of     methicillin-sensitive staph aureus.  He had methicillin-sensitive     staph aureus bacteremia in July which was treated with Ancef and I     would suspect the same organism has recurred.  For now though we     will continue him on some broad coverage with both vancomycin to     cover for methicillin-resistant staph aureus and the Zosyn which he     really needs more so for his gangrenous  gallbladder.  He is going     to need aggressive 6- to 8-week course of intravenous antibiotics     for site of osteomyelitis.  If we cannot identify the pathogen, we     have to consider whether to blame this on the recurrent methicillin-     sensitive staph aureus infection or other we might  want to worry     more about methicillin-resistant staph aureus and cover it as well. 3. Infection prevention.  For the time being, I have placed the     patient on contact precautions.  He has a site of osteomyelitis     which is actually likely due to methicillin-sensitive staph aureus     but we have cultures cooking     and he was given antibiotics, so I am still worried that he might     have methicillin-resistant staph aureus and we are covering     treatment of such.  Therefore, I will keep him on contact     precautions in the interim.  Thank you for the infectious disease consultation.     Acey Lav, MD     CV/MEDQ  D:  07/07/2010  T:  07/07/2010  Job:  [id number]  cc:   Anselm Pancoast. Zachery Dakins, M.D. [address], [address] [place] [address]  Toni Arthurs, MD Fax: [phone]  Electronically Signed by Paulette Blanch DAM MD on 07/22/2010 10:35:31 AM

## 2010-07-26 NOTE — Progress Notes (Signed)
Summary: Care Plan OVersight  Phone Note Outgoing Call   Call placed by: Acey Lav MD,  July 17, 2010 8:25 AM Details for Reason: Care Plan Oversight Summary of Call: [phone] (30 or more mins)  I have supervised home care and/or infusion therapy for this pt, including providing orders for care, review of labs and/or home health care plans, communicating with the home health care professionals and/or patient/caregivers to integrate current information into the medical treatment plan and/or adjust the medical therapy. This supervision has been provided for _32__minutes during the calendar month. Dates for this oversight January 27th, 2012 thru February 26th, 2012_.  Treating osteomyelitis with MSSA  Initial call taken by: Acey Lav MD,  July 17, 2010 8:26 AM

## 2010-07-27 ENCOUNTER — Encounter: Payer: Self-pay | Admitting: Infectious Disease

## 2010-08-01 ENCOUNTER — Encounter: Payer: Self-pay | Admitting: Infectious Disease

## 2010-08-08 ENCOUNTER — Encounter: Payer: Self-pay | Admitting: Infectious Disease

## 2010-08-15 NOTE — Miscellaneous (Signed)
Summary: [facility]: Meds  [facility]: [facility]   Imported By: Florinda Marker 08/11/2010 15:31:51  _____________________________________________________________________  External Attachment:    Type:   Image     Comment:   External Document

## 2010-08-23 ENCOUNTER — Encounter: Payer: Self-pay | Admitting: *Deleted

## 2010-08-23 ENCOUNTER — Emergency Department (HOSPITAL_COMMUNITY)
Admission: EM | Admit: 2010-08-23 | Discharge: 2010-08-23 | Disposition: A | Discharge status: Home / Self Care | Payer: Medicare Other | Attending: Emergency Medicine | Admitting: Emergency Medicine

## 2010-08-23 DIAGNOSIS — E109 Type 1 diabetes mellitus without complications: Secondary | ICD-10-CM | POA: Insufficient documentation

## 2010-08-23 DIAGNOSIS — I2510 Atherosclerotic heart disease of native coronary artery without angina pectoris: Secondary | ICD-10-CM | POA: Insufficient documentation

## 2010-08-23 DIAGNOSIS — G609 Hereditary and idiopathic neuropathy, unspecified: Secondary | ICD-10-CM | POA: Insufficient documentation

## 2010-08-23 DIAGNOSIS — A5211 Tabes dorsalis: Secondary | ICD-10-CM | POA: Insufficient documentation

## 2010-08-23 DIAGNOSIS — K3184 Gastroparesis: Secondary | ICD-10-CM | POA: Insufficient documentation

## 2010-08-23 DIAGNOSIS — K219 Gastro-esophageal reflux disease without esophagitis: Secondary | ICD-10-CM | POA: Insufficient documentation

## 2010-08-23 DIAGNOSIS — E785 Hyperlipidemia, unspecified: Secondary | ICD-10-CM | POA: Insufficient documentation

## 2010-08-23 DIAGNOSIS — R112 Nausea with vomiting, unspecified: Secondary | ICD-10-CM | POA: Insufficient documentation

## 2010-08-23 LAB — POCT I-STAT, CHEM 8
Calcium, Ion: 1.12 mmol/L (ref 1.12–1.32)
Chloride: 99 mEq/L (ref 96–112)
Creatinine, Ser: 1 mg/dL (ref 0.4–1.5)
Glucose, Bld: 234 mg/dL — ABNORMAL HIGH (ref 70–99)
HCT: 40 % (ref 39.0–52.0)

## 2010-08-24 NOTE — Consult Note (Signed)
Summary: [facility]   Imported By: Florinda Marker 08/15/2010 15:31:13  _____________________________________________________________________  External Attachment:    Type:   Image     Comment:   External Document

## 2010-08-24 NOTE — Op Note (Signed)
  [name], Samuel [name] NO.:  [id number]  MEDICAL RECORD NO.:  [id number]          PATIENT TYPE:  INP  LOCATION:  1342                         FACILITY:  [facility]  PHYSICIAN:  Brendan C. Madilyn Fireman, M.D.    DATE OF BIRTH:  [birth year]  DATE OF PROCEDURE:  07/06/2010 DATE OF DISCHARGE:                              OPERATIVE REPORT   INDICATIONS FOR PROCEDURE:  Abdominal pain with suspected duodenal perforation based on extraluminal fluid surrounding the second portion of the duodenum.  The procedure was requested by Dr. Consuello Bossier. He plans to take the patient to surgery later today.  PROCEDURE IN DETAIL:  The patient was placed in the left lateral decubitus position and placed on the pulse monitor with continuous low- flow oxygen delivered by nasal cannula.  He was sedated with 25 mcg IV fentanyl and 3 mg IV Versed.  An Olympus video endoscope was advanced under direct vision into the oropharynx and esophagus.  The esophagus was straight and of normal caliber with the squamocolumnar line at 38 cm.  There was no visible hiatal hernia, ring, stricture or other abnormality of the GE junction or esophagus.  The stomach was entered and a small amount of liquid secretions were suctioned from the fundus. Retroflexed view of the cardia was unremarkable.  The fundus and body appeared normal.  The antrum showed a few mild radially oriented streaks of erythema consistent with mild gastritis with no focal erosions or ulcers.  The pylorus was not deformed and easily allowed passage of the endoscope tip into the duodenum.  Both the bulb and second portion were well inspected and the scope was advanced as far as possible, probably toward the third portion of the duodenum and mucosa appeared normal with no extrinsic or intrinsic abnormalities noted.  The papilla Vater was located and easily visualized with clear amber bile seen to egress from it.  Dr. Zachery Dakins was in attendance and  observed the images on the screen.  The scope was then withdrawn and the patient was returned to the recovery room in stable condition.  He tolerated the procedure well and there were no immediate complications.  IMPRESSION: 1. Mild antral gastritis. 2. Otherwise a normal study and no endoscopic evidence for duodenal     perforation or ulcer.  PLAN:  Discuss the results with Dr. Zachery Dakins.          ______________________________ Everardo All Madilyn Fireman, M.D.     [facility]/MEDQ  D:  07/06/2010  T:  07/06/2010  Job:  [id number]  cc:   Anselm Pancoast. Zachery Dakins, M.D. [address] [place] [address]  Electronically Signed by Dorena Cookey M.D. on 08/22/2010 07:04:16 PM

## 2010-08-28 ENCOUNTER — Ambulatory Visit: Payer: Self-pay | Admitting: Infectious Disease

## 2010-08-28 LAB — GLUCOSE, CAPILLARY
Glucose-Capillary: 112 mg/dL — ABNORMAL HIGH (ref 70–99)
Glucose-Capillary: 113 mg/dL — ABNORMAL HIGH (ref 70–99)
Glucose-Capillary: 128 mg/dL — ABNORMAL HIGH (ref 70–99)
Glucose-Capillary: 136 mg/dL — ABNORMAL HIGH (ref 70–99)
Glucose-Capillary: 160 mg/dL — ABNORMAL HIGH (ref 70–99)
Glucose-Capillary: 182 mg/dL — ABNORMAL HIGH (ref 70–99)

## 2010-08-28 LAB — CBC
HCT: 33.9 % — ABNORMAL LOW (ref 39.0–52.0)
HCT: 42.2 % (ref 39.0–52.0)
Hemoglobin: 11.1 g/dL — ABNORMAL LOW (ref 13.0–17.0)
Hemoglobin: 11.5 g/dL — ABNORMAL LOW (ref 13.0–17.0)
Hemoglobin: 14.2 g/dL (ref 13.0–17.0)
MCH: 27.6 pg (ref 26.0–34.0)
MCH: 27.9 pg (ref 26.0–34.0)
MCHC: 32.4 g/dL (ref 30.0–36.0)
MCHC: 32.7 g/dL (ref 30.0–36.0)
MCV: 84.7 fL (ref 78.0–100.0)
MCV: 85.1 fL (ref 78.0–100.0)
MCV: 85.2 fL (ref 78.0–100.0)
RBC: 4.17 MIL/uL — ABNORMAL LOW (ref 4.22–5.81)
RBC: 4.98 MIL/uL (ref 4.22–5.81)
RDW: 16.2 % — ABNORMAL HIGH (ref 11.5–15.5)
RDW: 16.3 % — ABNORMAL HIGH (ref 11.5–15.5)
WBC: 6.7 10*3/uL (ref 4.0–10.5)

## 2010-08-28 LAB — COMPREHENSIVE METABOLIC PANEL
ALT: 21 U/L (ref 0–53)
Alkaline Phosphatase: 91 U/L (ref 39–117)
BUN: 23 mg/dL (ref 6–23)
CO2: 24 mEq/L (ref 19–32)
Chloride: 100 mEq/L (ref 96–112)
GFR calc non Af Amer: >60 mL/min (ref >60)
Glucose, Bld: 210 mg/dL — ABNORMAL HIGH (ref 70–99)
Potassium: 5.2 mEq/L — ABNORMAL HIGH (ref 3.5–5.1)
Sodium: 138 mEq/L (ref 135–145)
Total Bilirubin: 1.3 mg/dL — ABNORMAL HIGH (ref 0.3–1.2)

## 2010-08-28 LAB — BASIC METABOLIC PANEL
BUN: 16 mg/dL (ref 6–23)
CO2: 24 mEq/L (ref 19–32)
CO2: 25 mEq/L (ref 19–32)
Calcium: 9.2 mg/dL (ref 8.4–10.5)
Chloride: 107 mEq/L (ref 96–112)
Chloride: 107 mEq/L (ref 96–112)
Creatinine, Ser: 1.03 mg/dL (ref 0.4–1.5)
GFR calc Af Amer: >60 mL/min (ref >60)
GFR calc non Af Amer: >60 mL/min (ref >60)
Glucose, Bld: 129 mg/dL — ABNORMAL HIGH (ref 70–99)
Glucose, Bld: 154 mg/dL — ABNORMAL HIGH (ref 70–99)
Potassium: 3.5 mEq/L (ref 3.5–5.1)
Sodium: 139 mEq/L (ref 135–145)
Sodium: 140 mEq/L (ref 135–145)

## 2010-08-28 LAB — URINALYSIS, ROUTINE W REFLEX MICROSCOPIC
Bilirubin Urine: NEGATIVE
Hgb urine dipstick: NEGATIVE
Specific Gravity, Urine: 1.026 (ref 1.005–1.030)
Urobilinogen, UA: 1 mg/dL (ref 0.0–1.0)
pH: 7.5 (ref 5.0–8.0)

## 2010-08-28 LAB — HEMOGLOBIN A1C: Mean Plasma Glucose: 157 mg/dL — ABNORMAL HIGH (ref <117)

## 2010-08-30 ENCOUNTER — Encounter: Payer: Self-pay | Admitting: Infectious Disease

## 2010-08-30 ENCOUNTER — Ambulatory Visit (INDEPENDENT_AMBULATORY_CARE_PROVIDER_SITE_OTHER): Payer: BLUE CROSS/BLUE SHIELD

## 2010-08-30 DIAGNOSIS — M86179 Other acute osteomyelitis, unspecified ankle and foot: Secondary | ICD-10-CM

## 2010-08-30 DIAGNOSIS — K822 Perforation of gallbladder: Secondary | ICD-10-CM

## 2010-08-30 DIAGNOSIS — A4101 Sepsis due to Methicillin susceptible Staphylococcus aureus: Secondary | ICD-10-CM

## 2010-08-30 LAB — CONVERTED CEMR LAB
BUN: 19 mg/dL (ref 6–23)
Basophils Absolute: 0 10*3/uL (ref 0.0–0.1)
Basophils Relative: 1 % (ref 0–1)
CO2: 25 meq/L (ref 19–32)
CRP: 1 mg/dL — ABNORMAL HIGH (ref <0.6)
Chloride: 102 meq/L (ref 96–112)
Creatinine, Ser: 0.97 mg/dL (ref 0.40–1.50)
Eosinophils Absolute: 0.3 10*3/uL (ref 0.0–0.7)
Glucose, Bld: 215 mg/dL — ABNORMAL HIGH (ref 70–99)
MCHC: 31.2 g/dL (ref 30.0–36.0)
MCV: 88 fL (ref 78.0–100.0)
Monocytes Relative: 9 % (ref 3–12)
Neutrophils Relative %: 66 % (ref 43–77)
Platelets: 275 10*3/uL (ref 150–400)
Potassium: 4.2 meq/L (ref 3.5–5.3)
RBC: 4.26 M/uL (ref 4.22–5.81)
RDW: 14.1 % (ref 11.5–15.5)

## 2010-08-31 LAB — POCT I-STAT, CHEM 8
BUN: 17 mg/dL (ref 6–23)
Calcium, Ion: 1.19 mmol/L (ref 1.12–1.32)
Hemoglobin: 12.6 g/dL — ABNORMAL LOW (ref 13.0–17.0)
TCO2: 28 mmol/L (ref 0–100)

## 2010-08-31 LAB — GLUCOSE, CAPILLARY: Glucose-Capillary: 177 mg/dL — ABNORMAL HIGH (ref 70–99)

## 2010-09-02 LAB — COMPREHENSIVE METABOLIC PANEL
ALT: 13 U/L (ref 0–53)
ALT: 13 U/L (ref 0–53)
ALT: 10 U/L (ref 0–53)
AST: 15 U/L (ref 0–37)
AST: 22 U/L (ref 0–37)
Albumin: 2.5 g/dL — ABNORMAL LOW (ref 3.5–5.2)
Alkaline Phosphatase: 62 U/L (ref 39–117)
CO2: 24 mEq/L (ref 19–32)
CO2: 29 mEq/L (ref 19–32)
CO2: 28 mEq/L (ref 19–32)
Calcium: 9 mg/dL (ref 8.4–10.5)
Calcium: 9.3 mg/dL (ref 8.4–10.5)
Calcium: 9.4 mg/dL (ref 8.4–10.5)
Chloride: 97 mEq/L (ref 96–112)
Chloride: 99 mEq/L (ref 96–112)
Chloride: 98 mEq/L (ref 96–112)
Creatinine, Ser: 0.82 mg/dL (ref 0.4–1.5)
GFR calc Af Amer: >60 mL/min (ref >60)
GFR calc Af Amer: >60 mL/min (ref >60)
GFR calc non Af Amer: >60 mL/min (ref >60)
GFR calc non Af Amer: >60 mL/min (ref >60)
GFR calc non Af Amer: >60 mL/min (ref >60)
Glucose, Bld: 201 mg/dL — ABNORMAL HIGH (ref 70–99)
Glucose, Bld: 165 mg/dL — ABNORMAL HIGH (ref 70–99)
Potassium: 4 mEq/L (ref 3.5–5.1)
Sodium: 133 mEq/L — ABNORMAL LOW (ref 135–145)
Sodium: 136 mEq/L (ref 135–145)
Total Bilirubin: 0.4 mg/dL (ref 0.3–1.2)
Total Bilirubin: 0.4 mg/dL (ref 0.3–1.2)
Total Bilirubin: 0.6 mg/dL (ref 0.3–1.2)

## 2010-09-02 LAB — URINALYSIS, ROUTINE W REFLEX MICROSCOPIC
Bilirubin Urine: NEGATIVE
Glucose, UA: 100 mg/dL — AB
Hgb urine dipstick: NEGATIVE
Hgb urine dipstick: NEGATIVE
Ketones, ur: NEGATIVE mg/dL
Leukocytes, UA: NEGATIVE
Nitrite: NEGATIVE
Protein, ur: NEGATIVE mg/dL
Protein, ur: 30 mg/dL — AB
Specific Gravity, Urine: 1.027 (ref 1.005–1.030)
Urobilinogen, UA: 0.2 mg/dL (ref 0.0–1.0)

## 2010-09-02 LAB — CBC
HCT: 26.7 % — ABNORMAL LOW (ref 39.0–52.0)
HCT: 28.8 % — ABNORMAL LOW (ref 39.0–52.0)
Hemoglobin: 10 g/dL — ABNORMAL LOW (ref 13.0–17.0)
Hemoglobin: 10.1 g/dL — ABNORMAL LOW (ref 13.0–17.0)
Hemoglobin: 8.7 g/dL — ABNORMAL LOW (ref 13.0–17.0)
Hemoglobin: 8.9 g/dL — ABNORMAL LOW (ref 13.0–17.0)
Hemoglobin: 9.6 g/dL — ABNORMAL LOW (ref 13.0–17.0)
MCH: 28.4 pg (ref 26.0–34.0)
MCH: 28.6 pg (ref 26.0–34.0)
MCH: 29.3 pg (ref 26.0–34.0)
MCH: 28.2 pg (ref 26.0–34.0)
MCHC: 32.8 g/dL (ref 30.0–36.0)
MCHC: 33.5 g/dL (ref 30.0–36.0)
MCV: 86.6 fL (ref 78.0–100.0)
Platelets: 503 10*3/uL — ABNORMAL HIGH (ref 150–400)
RBC: 3.05 MIL/uL — ABNORMAL LOW (ref 4.22–5.81)
RBC: 3.12 MIL/uL — ABNORMAL LOW (ref 4.22–5.81)
RBC: 3.13 MIL/uL — ABNORMAL LOW (ref 4.22–5.81)
RBC: 3.46 MIL/uL — ABNORMAL LOW (ref 4.22–5.81)
RBC: 3.51 MIL/uL — ABNORMAL LOW (ref 4.22–5.81)
WBC: 6.9 10*3/uL (ref 4.0–10.5)
WBC: 8.3 10*3/uL (ref 4.0–10.5)

## 2010-09-02 LAB — DIFFERENTIAL
Basophils Absolute: 0 10*3/uL (ref 0.0–0.1)
Eosinophils Absolute: 0.1 10*3/uL (ref 0.0–0.7)
Eosinophils Absolute: 0.2 10*3/uL (ref 0.0–0.7)
Eosinophils Relative: 1 % (ref 0–5)
Lymphocytes Relative: 24 % (ref 12–46)
Lymphocytes Relative: 19 % (ref 12–46)
Lymphs Abs: 1.4 10*3/uL (ref 0.7–4.0)
Lymphs Abs: 2 10*3/uL (ref 0.7–4.0)
Lymphs Abs: 0.9 10*3/uL (ref 0.7–4.0)
Monocytes Absolute: 0.5 10*3/uL (ref 0.1–1.0)
Monocytes Relative: 8 % (ref 3–12)
Neutro Abs: 5.4 10*3/uL (ref 1.7–7.7)
Neutrophils Relative %: 65 % (ref 43–77)
Neutrophils Relative %: 70 % (ref 43–77)

## 2010-09-02 LAB — URINE CULTURE
Colony Count: NO GROWTH
Culture: NO GROWTH
Culture: NO GROWTH

## 2010-09-02 LAB — GLUCOSE, CAPILLARY
Glucose-Capillary: 169 mg/dL — ABNORMAL HIGH (ref 70–99)
Glucose-Capillary: 171 mg/dL — ABNORMAL HIGH (ref 70–99)
Glucose-Capillary: 179 mg/dL — ABNORMAL HIGH (ref 70–99)
Glucose-Capillary: 184 mg/dL — ABNORMAL HIGH (ref 70–99)
Glucose-Capillary: 185 mg/dL — ABNORMAL HIGH (ref 70–99)
Glucose-Capillary: 187 mg/dL — ABNORMAL HIGH (ref 70–99)
Glucose-Capillary: 193 mg/dL — ABNORMAL HIGH (ref 70–99)
Glucose-Capillary: 195 mg/dL — ABNORMAL HIGH (ref 70–99)
Glucose-Capillary: 197 mg/dL — ABNORMAL HIGH (ref 70–99)
Glucose-Capillary: 201 mg/dL — ABNORMAL HIGH (ref 70–99)
Glucose-Capillary: 214 mg/dL — ABNORMAL HIGH (ref 70–99)
Glucose-Capillary: 216 mg/dL — ABNORMAL HIGH (ref 70–99)
Glucose-Capillary: 216 mg/dL — ABNORMAL HIGH (ref 70–99)
Glucose-Capillary: 220 mg/dL — ABNORMAL HIGH (ref 70–99)
Glucose-Capillary: 255 mg/dL — ABNORMAL HIGH (ref 70–99)

## 2010-09-02 LAB — POCT I-STAT, CHEM 8
BUN: 12 mg/dL (ref 6–23)
Chloride: 101 mEq/L (ref 96–112)
Creatinine, Ser: 1 mg/dL (ref 0.4–1.5)
Glucose, Bld: 128 mg/dL — ABNORMAL HIGH (ref 70–99)
Potassium: 4 mEq/L (ref 3.5–5.1)

## 2010-09-02 LAB — BASIC METABOLIC PANEL
BUN: 3 mg/dL — ABNORMAL LOW (ref 6–23)
CO2: 28 mEq/L (ref 19–32)
CO2: 28 mEq/L (ref 19–32)
CO2: 29 mEq/L (ref 19–32)
Calcium: 8.3 mg/dL — ABNORMAL LOW (ref 8.4–10.5)
Calcium: 8.8 mg/dL (ref 8.4–10.5)
Chloride: 100 mEq/L (ref 96–112)
Chloride: 100 mEq/L (ref 96–112)
Chloride: 99 mEq/L (ref 96–112)
Creatinine, Ser: 0.86 mg/dL (ref 0.4–1.5)
Creatinine, Ser: 0.94 mg/dL (ref 0.4–1.5)
GFR calc Af Amer: >60 mL/min (ref >60)
GFR calc Af Amer: >60 mL/min (ref >60)
Sodium: 136 mEq/L (ref 135–145)

## 2010-09-02 LAB — TSH: TSH: 1.432 u[IU]/mL (ref 0.350–4.500)

## 2010-09-02 LAB — HEMOGLOBIN A1C: Hgb A1c MFr Bld: 6.4 % — ABNORMAL HIGH (ref <5.7)

## 2010-09-02 LAB — POCT CARDIAC MARKERS: Troponin i, poc: <0.05 ng/mL (ref 0.00–0.09)

## 2010-09-02 LAB — LIPASE, BLOOD
Lipase: 23 U/L (ref 11–59)
Lipase: 31 U/L (ref 11–59)

## 2010-09-02 LAB — PHOSPHORUS: Phosphorus: 4.2 mg/dL (ref 2.3–4.6)

## 2010-09-02 LAB — URINE MICROSCOPIC-ADD ON

## 2010-09-02 LAB — MAGNESIUM: Magnesium: 1.7 mg/dL (ref 1.5–2.5)

## 2010-09-03 LAB — CBC
HCT: 25.5 % — ABNORMAL LOW (ref 39.0–52.0)
HCT: 26.3 % — ABNORMAL LOW (ref 39.0–52.0)
HCT: 33.9 % — ABNORMAL LOW (ref 39.0–52.0)
Hemoglobin: 8.8 g/dL — ABNORMAL LOW (ref 13.0–17.0)
Hemoglobin: 8.9 g/dL — ABNORMAL LOW (ref 13.0–17.0)
MCH: 29 pg (ref 26.0–34.0)
MCH: 29.4 pg (ref 26.0–34.0)
MCH: 29.5 pg (ref 26.0–34.0)
MCHC: 33.2 g/dL (ref 30.0–36.0)
MCHC: 33.2 g/dL (ref 30.0–36.0)
MCHC: 33.3 g/dL (ref 30.0–36.0)
MCHC: 33.3 g/dL (ref 30.0–36.0)
MCHC: 33.4 g/dL (ref 30.0–36.0)
MCHC: 35.1 g/dL (ref 30.0–36.0)
MCV: 88.2 fL (ref 78.0–100.0)
MCV: 88.3 fL (ref 78.0–100.0)
MCV: 89.6 fL (ref 78.0–100.0)
Platelets: 316 10*3/uL (ref 150–400)
Platelets: 327 10*3/uL (ref 150–400)
Platelets: 338 10*3/uL (ref 150–400)
Platelets: 345 10*3/uL (ref 150–400)
Platelets: 382 10*3/uL (ref 150–400)
Platelets: 214 10*3/uL (ref 150–400)
RBC: 3.7 MIL/uL — ABNORMAL LOW (ref 4.22–5.81)
RBC: 3.79 MIL/uL — ABNORMAL LOW (ref 4.22–5.81)
RDW: 12.9 % (ref 11.5–15.5)
RDW: 13.5 % (ref 11.5–15.5)
RDW: 14 % (ref 11.5–15.5)
WBC: 6.9 10*3/uL (ref 4.0–10.5)
WBC: 7.5 10*3/uL (ref 4.0–10.5)

## 2010-09-03 LAB — DIFFERENTIAL
Basophils Absolute: 0.1 10*3/uL (ref 0.0–0.1)
Basophils Absolute: 0.2 10*3/uL — ABNORMAL HIGH (ref 0.0–0.1)
Basophils Relative: 1 % (ref 0–1)
Basophils Relative: 3 % — ABNORMAL HIGH (ref 0–1)
Eosinophils Absolute: 0 10*3/uL (ref 0.0–0.7)
Eosinophils Relative: 0 % (ref 0–5)
Eosinophils Relative: 0 % (ref 0–5)
Lymphocytes Relative: 4 % — ABNORMAL LOW (ref 12–46)
Lymphs Abs: 0.4 10*3/uL — ABNORMAL LOW (ref 0.7–4.0)
Monocytes Absolute: 0.6 10*3/uL (ref 0.1–1.0)
Monocytes Relative: 6 % (ref 3–12)
Neutro Abs: 4.7 10*3/uL (ref 1.7–7.7)
Neutro Abs: 8.8 10*3/uL — ABNORMAL HIGH (ref 1.7–7.7)
Neutrophils Relative %: 68 % (ref 43–77)

## 2010-09-03 LAB — POCT I-STAT, CHEM 8
BUN: 11 mg/dL (ref 6–23)
Calcium, Ion: 1.16 mmol/L (ref 1.12–1.32)
Chloride: 100 meq/L (ref 96–112)
Creatinine, Ser: 1 mg/dL (ref 0.4–1.5)
Glucose, Bld: 186 mg/dL — ABNORMAL HIGH (ref 70–99)
HCT: 32 % — ABNORMAL LOW (ref 39.0–52.0)
Hemoglobin: 10.9 g/dL — ABNORMAL LOW (ref 13.0–17.0)
Potassium: 3.8 meq/L (ref 3.5–5.1)
Sodium: 135 meq/L (ref 135–145)
TCO2: 24 mmol/L (ref 0–100)

## 2010-09-03 LAB — URINE MICROSCOPIC-ADD ON

## 2010-09-03 LAB — BASIC METABOLIC PANEL
BUN: 12 mg/dL (ref 6–23)
BUN: 2 mg/dL — ABNORMAL LOW (ref 6–23)
BUN: 15 mg/dL (ref 6–23)
CO2: 19 mEq/L (ref 19–32)
CO2: 27 mEq/L (ref 19–32)
CO2: 26 mEq/L (ref 19–32)
Calcium: 8.4 mg/dL (ref 8.4–10.5)
Calcium: 8.5 mg/dL (ref 8.4–10.5)
Calcium: 8.8 mg/dL (ref 8.4–10.5)
Calcium: 9.2 mg/dL (ref 8.4–10.5)
Chloride: 102 mEq/L (ref 96–112)
Chloride: 104 mEq/L (ref 96–112)
Creatinine, Ser: 0.85 mg/dL (ref 0.4–1.5)
Creatinine, Ser: 0.92 mg/dL (ref 0.4–1.5)
Creatinine, Ser: 0.95 mg/dL (ref 0.4–1.5)
Creatinine, Ser: 1.12 mg/dL (ref 0.4–1.5)
GFR calc Af Amer: >60 mL/min (ref >60)
GFR calc Af Amer: >60 mL/min (ref >60)
GFR calc Af Amer: >60 mL/min (ref >60)
GFR calc non Af Amer: >60 mL/min (ref >60)
GFR calc non Af Amer: >60 mL/min (ref >60)
GFR calc non Af Amer: >60 mL/min (ref >60)
Glucose, Bld: 127 mg/dL — ABNORMAL HIGH (ref 70–99)
Glucose, Bld: 143 mg/dL — ABNORMAL HIGH (ref 70–99)
Glucose, Bld: 182 mg/dL — ABNORMAL HIGH (ref 70–99)
Potassium: 3.5 mEq/L (ref 3.5–5.1)
Potassium: 4.2 mEq/L (ref 3.5–5.1)
Potassium: 4.2 mEq/L (ref 3.5–5.1)
Sodium: 134 mEq/L — ABNORMAL LOW (ref 135–145)
Sodium: 136 mEq/L (ref 135–145)
Sodium: 137 mEq/L (ref 135–145)

## 2010-09-03 LAB — COMPREHENSIVE METABOLIC PANEL
ALT: 15 U/L (ref 0–53)
ALT: 21 U/L (ref 0–53)
AST: 30 U/L (ref 0–37)
Albumin: 2.1 g/dL — ABNORMAL LOW (ref 3.5–5.2)
Albumin: 2.5 g/dL — ABNORMAL LOW (ref 3.5–5.2)
Albumin: 3.1 g/dL — ABNORMAL LOW (ref 3.5–5.2)
Alkaline Phosphatase: 65 U/L (ref 39–117)
BUN: 10 mg/dL (ref 6–23)
CO2: 25 mEq/L (ref 19–32)
Calcium: 8.6 mg/dL (ref 8.4–10.5)
Calcium: 9.2 mg/dL (ref 8.4–10.5)
Chloride: 98 mEq/L (ref 96–112)
Creatinine, Ser: 0.98 mg/dL (ref 0.4–1.5)
GFR calc Af Amer: >60 mL/min (ref >60)
GFR calc non Af Amer: >60 mL/min (ref >60)
Glucose, Bld: 137 mg/dL — ABNORMAL HIGH (ref 70–99)
Glucose, Bld: 161 mg/dL — ABNORMAL HIGH (ref 70–99)
Potassium: 4.1 mEq/L (ref 3.5–5.1)
Sodium: 134 mEq/L — ABNORMAL LOW (ref 135–145)
Sodium: 136 mEq/L (ref 135–145)
Total Bilirubin: 0.8 mg/dL (ref 0.3–1.2)
Total Protein: 6.3 g/dL (ref 6.0–8.3)
Total Protein: 7.3 g/dL (ref 6.0–8.3)

## 2010-09-03 LAB — URINE CULTURE
Colony Count: NO GROWTH
Culture: NO GROWTH

## 2010-09-03 LAB — GLUCOSE, CAPILLARY
Glucose-Capillary: 120 mg/dL — ABNORMAL HIGH (ref 70–99)
Glucose-Capillary: 125 mg/dL — ABNORMAL HIGH (ref 70–99)
Glucose-Capillary: 129 mg/dL — ABNORMAL HIGH (ref 70–99)
Glucose-Capillary: 135 mg/dL — ABNORMAL HIGH (ref 70–99)
Glucose-Capillary: 136 mg/dL — ABNORMAL HIGH (ref 70–99)
Glucose-Capillary: 139 mg/dL — ABNORMAL HIGH (ref 70–99)
Glucose-Capillary: 140 mg/dL — ABNORMAL HIGH (ref 70–99)
Glucose-Capillary: 140 mg/dL — ABNORMAL HIGH (ref 70–99)
Glucose-Capillary: 140 mg/dL — ABNORMAL HIGH (ref 70–99)
Glucose-Capillary: 150 mg/dL — ABNORMAL HIGH (ref 70–99)
Glucose-Capillary: 151 mg/dL — ABNORMAL HIGH (ref 70–99)
Glucose-Capillary: 152 mg/dL — ABNORMAL HIGH (ref 70–99)
Glucose-Capillary: 153 mg/dL — ABNORMAL HIGH (ref 70–99)
Glucose-Capillary: 157 mg/dL — ABNORMAL HIGH (ref 70–99)
Glucose-Capillary: 167 mg/dL — ABNORMAL HIGH (ref 70–99)
Glucose-Capillary: 171 mg/dL — ABNORMAL HIGH (ref 70–99)
Glucose-Capillary: 171 mg/dL — ABNORMAL HIGH (ref 70–99)
Glucose-Capillary: 171 mg/dL — ABNORMAL HIGH (ref 70–99)
Glucose-Capillary: 175 mg/dL — ABNORMAL HIGH (ref 70–99)
Glucose-Capillary: 200 mg/dL — ABNORMAL HIGH (ref 70–99)
Glucose-Capillary: 221 mg/dL — ABNORMAL HIGH (ref 70–99)
Glucose-Capillary: 104 mg/dL — ABNORMAL HIGH (ref 70–99)
Glucose-Capillary: 106 mg/dL — ABNORMAL HIGH (ref 70–99)
Glucose-Capillary: 109 mg/dL — ABNORMAL HIGH (ref 70–99)
Glucose-Capillary: 109 mg/dL — ABNORMAL HIGH (ref 70–99)
Glucose-Capillary: 117 mg/dL — ABNORMAL HIGH (ref 70–99)
Glucose-Capillary: 119 mg/dL — ABNORMAL HIGH (ref 70–99)
Glucose-Capillary: 130 mg/dL — ABNORMAL HIGH (ref 70–99)
Glucose-Capillary: 130 mg/dL — ABNORMAL HIGH (ref 70–99)
Glucose-Capillary: 131 mg/dL — ABNORMAL HIGH (ref 70–99)
Glucose-Capillary: 149 mg/dL — ABNORMAL HIGH (ref 70–99)

## 2010-09-03 LAB — URINALYSIS, ROUTINE W REFLEX MICROSCOPIC
Glucose, UA: NEGATIVE mg/dL
Hgb urine dipstick: NEGATIVE
Ketones, ur: 15 mg/dL — AB
Leukocytes, UA: NEGATIVE
Nitrite: NEGATIVE
Protein, ur: 30 mg/dL — AB
Specific Gravity, Urine: 1.024 (ref 1.005–1.030)
Urobilinogen, UA: 1 mg/dL (ref 0.0–1.0)
Urobilinogen, UA: 2 mg/dL — ABNORMAL HIGH (ref 0.0–1.0)
pH: 6 (ref 5.0–8.0)

## 2010-09-03 LAB — PHOSPHORUS: Phosphorus: 1.6 mg/dL — ABNORMAL LOW (ref 2.3–4.6)

## 2010-09-03 LAB — LIPID PANEL
Cholesterol: 91 mg/dL (ref 0–200)
HDL: 18 mg/dL — ABNORMAL LOW (ref >39)
Triglycerides: 99 mg/dL (ref <150)

## 2010-09-03 LAB — CULTURE, BLOOD (ROUTINE X 2): Culture: NO GROWTH

## 2010-09-03 LAB — IRON AND TIBC

## 2010-09-03 LAB — VITAMIN B12: Vitamin B-12: 814 pg/mL (ref 211–911)

## 2010-09-03 LAB — LIPASE, BLOOD: Lipase: 18 U/L (ref 11–59)

## 2010-09-03 LAB — HEMOGLOBIN A1C
Hgb A1c MFr Bld: 6.4 % — ABNORMAL HIGH (ref <5.7)
Mean Plasma Glucose: 137 mg/dL — ABNORMAL HIGH (ref <117)

## 2010-09-03 LAB — FOLATE: Folate: 12.8 ng/mL

## 2010-09-03 LAB — MAGNESIUM
Magnesium: 1.5 mg/dL (ref 1.5–2.5)
Magnesium: 1.9 mg/dL (ref 1.5–2.5)

## 2010-09-03 LAB — LACTIC ACID, PLASMA: Lactic Acid, Venous: 0.7 mmol/L (ref 0.5–2.2)

## 2010-09-04 LAB — CBC
HCT: 39.5 % (ref 39.0–52.0)
Hemoglobin: 13.4 g/dL (ref 13.0–17.0)
RDW: 13.2 % (ref 11.5–15.5)

## 2010-09-04 LAB — BASIC METABOLIC PANEL
CO2: 30 mEq/L (ref 19–32)
GFR calc non Af Amer: >60 mL/min (ref >60)
Glucose, Bld: 105 mg/dL — ABNORMAL HIGH (ref 70–99)
Potassium: 5.3 mEq/L — ABNORMAL HIGH (ref 3.5–5.1)
Sodium: 141 mEq/L (ref 135–145)

## 2010-09-04 LAB — GLUCOSE, CAPILLARY
Glucose-Capillary: 132 mg/dL — ABNORMAL HIGH (ref 70–99)
Glucose-Capillary: 100 mg/dL — ABNORMAL HIGH (ref 70–99)

## 2010-09-05 LAB — CK TOTAL AND CKMB (NOT AT ARMC)
CK, MB: 1 ng/mL (ref 0.3–4.0)
Total CK: 76 U/L (ref 7–232)
Total CK: 76 U/L (ref 7–232)
Total CK: 93 U/L (ref 7–232)

## 2010-09-05 LAB — CBC
HCT: 32.3 % — ABNORMAL LOW (ref 39.0–52.0)
HCT: 32.9 % — ABNORMAL LOW (ref 39.0–52.0)
HCT: 35.9 % — ABNORMAL LOW (ref 39.0–52.0)
HCT: 36.7 % — ABNORMAL LOW (ref 39.0–52.0)
HCT: 39.6 % (ref 39.0–52.0)
Hemoglobin: 11.6 g/dL — ABNORMAL LOW (ref 13.0–17.0)
Hemoglobin: 11.7 g/dL — ABNORMAL LOW (ref 13.0–17.0)
Hemoglobin: 12.8 g/dL — ABNORMAL LOW (ref 13.0–17.0)
Hemoglobin: 13.8 g/dL (ref 13.0–17.0)
MCHC: 34.2 g/dL (ref 30.0–36.0)
MCHC: 35 g/dL (ref 30.0–36.0)
MCHC: 35.1 g/dL (ref 30.0–36.0)
MCHC: 34.9 g/dL (ref 30.0–36.0)
MCV: 90.8 fL (ref 78.0–100.0)
MCV: 91.3 fL (ref 78.0–100.0)
MCV: 91.4 fL (ref 78.0–100.0)
MCV: 91.6 fL (ref 78.0–100.0)
MCV: 91.6 fL (ref 78.0–100.0)
MCV: 91.7 fL (ref 78.0–100.0)
MCV: 91.9 fL (ref 78.0–100.0)
MCV: 92.4 fL (ref 78.0–100.0)
MCV: 91.1 fL (ref 78.0–100.0)
Platelets: 185 10*3/uL (ref 150–400)
Platelets: 227 10*3/uL (ref 150–400)
Platelets: 282 10*3/uL (ref 150–400)
Platelets: 297 10*3/uL (ref 150–400)
Platelets: 348 10*3/uL (ref 150–400)
Platelets: 356 10*3/uL (ref 150–400)
Platelets: 383 10*3/uL (ref 150–400)
Platelets: 189 10*3/uL (ref 150–400)
RBC: 3.72 MIL/uL — ABNORMAL LOW (ref 4.22–5.81)
RBC: 4.35 MIL/uL (ref 4.22–5.81)
RDW: 12.6 % (ref 11.5–15.5)
RDW: 12.6 % (ref 11.5–15.5)
RDW: 12.7 % (ref 11.5–15.5)
RDW: 12.8 % (ref 11.5–15.5)
RDW: 12.9 % (ref 11.5–15.5)
RDW: 12.9 % (ref 11.5–15.5)
RDW: 12.8 % (ref 11.5–15.5)
WBC: 6.1 10*3/uL (ref 4.0–10.5)
WBC: 7.3 10*3/uL (ref 4.0–10.5)
WBC: 8.1 10*3/uL (ref 4.0–10.5)
WBC: 8.9 10*3/uL (ref 4.0–10.5)
WBC: 8.8 10*3/uL (ref 4.0–10.5)

## 2010-09-05 LAB — GLUCOSE, CAPILLARY
Glucose-Capillary: 100 mg/dL — ABNORMAL HIGH (ref 70–99)
Glucose-Capillary: 111 mg/dL — ABNORMAL HIGH (ref 70–99)
Glucose-Capillary: 112 mg/dL — ABNORMAL HIGH (ref 70–99)
Glucose-Capillary: 118 mg/dL — ABNORMAL HIGH (ref 70–99)
Glucose-Capillary: 126 mg/dL — ABNORMAL HIGH (ref 70–99)
Glucose-Capillary: 134 mg/dL — ABNORMAL HIGH (ref 70–99)
Glucose-Capillary: 141 mg/dL — ABNORMAL HIGH (ref 70–99)
Glucose-Capillary: 148 mg/dL — ABNORMAL HIGH (ref 70–99)
Glucose-Capillary: 155 mg/dL — ABNORMAL HIGH (ref 70–99)
Glucose-Capillary: 159 mg/dL — ABNORMAL HIGH (ref 70–99)
Glucose-Capillary: 171 mg/dL — ABNORMAL HIGH (ref 70–99)
Glucose-Capillary: 181 mg/dL — ABNORMAL HIGH (ref 70–99)
Glucose-Capillary: 184 mg/dL — ABNORMAL HIGH (ref 70–99)
Glucose-Capillary: 236 mg/dL — ABNORMAL HIGH (ref 70–99)
Glucose-Capillary: 238 mg/dL — ABNORMAL HIGH (ref 70–99)
Glucose-Capillary: 60 mg/dL — ABNORMAL LOW (ref 70–99)
Glucose-Capillary: 72 mg/dL (ref 70–99)
Glucose-Capillary: 74 mg/dL (ref 70–99)
Glucose-Capillary: 79 mg/dL (ref 70–99)
Glucose-Capillary: 82 mg/dL (ref 70–99)
Glucose-Capillary: 82 mg/dL (ref 70–99)
Glucose-Capillary: 82 mg/dL (ref 70–99)
Glucose-Capillary: 89 mg/dL (ref 70–99)
Glucose-Capillary: 90 mg/dL (ref 70–99)
Glucose-Capillary: 91 mg/dL (ref 70–99)
Glucose-Capillary: 92 mg/dL (ref 70–99)
Glucose-Capillary: 346 mg/dL — ABNORMAL HIGH (ref 70–99)

## 2010-09-05 LAB — DIFFERENTIAL
Basophils Absolute: 0 10*3/uL (ref 0.0–0.1)
Basophils Absolute: 0 10*3/uL (ref 0.0–0.1)
Basophils Relative: 1 % (ref 0–1)
Basophils Relative: 0 % (ref 0–1)
Eosinophils Absolute: 0.1 10*3/uL (ref 0.0–0.7)
Eosinophils Absolute: 0.1 10*3/uL (ref 0.0–0.7)
Eosinophils Absolute: 0 10*3/uL (ref 0.0–0.7)
Eosinophils Relative: 0 % (ref 0–5)
Lymphocytes Relative: 11 % — ABNORMAL LOW (ref 12–46)
Lymphocytes Relative: 25 % (ref 12–46)
Lymphocytes Relative: 7 % — ABNORMAL LOW (ref 12–46)
Lymphs Abs: 1.6 10*3/uL (ref 0.7–4.0)
Lymphs Abs: 0.7 K/uL (ref 0.7–4.0)
Monocytes Absolute: 0.7 10*3/uL (ref 0.1–1.0)
Monocytes Absolute: 0.7 10*3/uL (ref 0.1–1.0)
Monocytes Absolute: 0.4 10*3/uL (ref 0.1–1.0)
Monocytes Relative: 10 % (ref 3–12)
Monocytes Relative: 12 % (ref 3–12)
Monocytes Relative: 6 % (ref 3–12)
Monocytes Relative: 4 % (ref 3–12)
Neutro Abs: 3.5 10*3/uL (ref 1.7–7.7)
Neutro Abs: 4.8 10*3/uL (ref 1.7–7.7)
Neutro Abs: 8.7 10*3/uL — ABNORMAL HIGH (ref 1.7–7.7)
Neutro Abs: 7.8 K/uL — ABNORMAL HIGH (ref 1.7–7.7)
Neutrophils Relative %: 66 % (ref 43–77)
Neutrophils Relative %: 82 % — ABNORMAL HIGH (ref 43–77)
Neutrophils Relative %: 88 % — ABNORMAL HIGH (ref 43–77)

## 2010-09-05 LAB — COMPREHENSIVE METABOLIC PANEL
ALT: 17 U/L (ref 0–53)
AST: 16 U/L (ref 0–37)
AST: 21 U/L (ref 0–37)
AST: 23 U/L (ref 0–37)
Albumin: 2.7 g/dL — ABNORMAL LOW (ref 3.5–5.2)
Albumin: 2.9 g/dL — ABNORMAL LOW (ref 3.5–5.2)
Albumin: 3 g/dL — ABNORMAL LOW (ref 3.5–5.2)
Albumin: 3.1 g/dL — ABNORMAL LOW (ref 3.5–5.2)
Albumin: 3.2 g/dL — ABNORMAL LOW (ref 3.5–5.2)
Albumin: 3.5 g/dL (ref 3.5–5.2)
Alkaline Phosphatase: 46 U/L (ref 39–117)
Alkaline Phosphatase: 54 U/L (ref 39–117)
Alkaline Phosphatase: 61 U/L (ref 39–117)
BUN: 10 mg/dL (ref 6–23)
BUN: 10 mg/dL (ref 6–23)
BUN: 15 mg/dL (ref 6–23)
CO2: 24 mEq/L (ref 19–32)
Calcium: 9.1 mg/dL (ref 8.4–10.5)
Chloride: 103 mEq/L (ref 96–112)
Chloride: 103 mEq/L (ref 96–112)
Chloride: 106 mEq/L (ref 96–112)
Chloride: 101 mEq/L (ref 96–112)
Creatinine, Ser: 0.86 mg/dL (ref 0.4–1.5)
Creatinine, Ser: 1 mg/dL (ref 0.4–1.5)
Creatinine, Ser: 1.07 mg/dL (ref 0.4–1.5)
Creatinine, Ser: 1.09 mg/dL (ref 0.4–1.5)
Creatinine, Ser: 1.05 mg/dL (ref 0.4–1.5)
GFR calc Af Amer: >60 mL/min (ref >60)
GFR calc Af Amer: >60 mL/min (ref >60)
GFR calc Af Amer: >60 mL/min (ref >60)
GFR calc non Af Amer: >60 mL/min (ref >60)
Glucose, Bld: 91 mg/dL (ref 70–99)
Potassium: 3.6 mEq/L (ref 3.5–5.1)
Potassium: 4.6 mEq/L (ref 3.5–5.1)
Potassium: 3.9 mEq/L (ref 3.5–5.1)
Sodium: 140 mEq/L (ref 135–145)
Total Bilirubin: 0.4 mg/dL (ref 0.3–1.2)
Total Bilirubin: 0.5 mg/dL (ref 0.3–1.2)
Total Bilirubin: 0.9 mg/dL (ref 0.3–1.2)
Total Bilirubin: 1.1 mg/dL (ref 0.3–1.2)
Total Bilirubin: 1.7 mg/dL — ABNORMAL HIGH (ref 0.3–1.2)
Total Protein: 6.4 g/dL (ref 6.0–8.3)
Total Protein: 6.6 g/dL (ref 6.0–8.3)
Total Protein: 6.8 g/dL (ref 6.0–8.3)
Total Protein: 7.1 g/dL (ref 6.0–8.3)

## 2010-09-05 LAB — TSH: TSH: 1.522 u[IU]/mL (ref 0.350–4.500)

## 2010-09-05 LAB — URINALYSIS, ROUTINE W REFLEX MICROSCOPIC
Bilirubin Urine: NEGATIVE
Glucose, UA: >1000 mg/dL — AB
Hgb urine dipstick: NEGATIVE
Hgb urine dipstick: NEGATIVE
Ketones, ur: >80 mg/dL — AB
Leukocytes, UA: NEGATIVE
Nitrite: NEGATIVE
Nitrite: NEGATIVE
Protein, ur: NEGATIVE mg/dL
Protein, ur: NEGATIVE mg/dL
Specific Gravity, Urine: 1.01 (ref 1.005–1.030)
Specific Gravity, Urine: 1.038 — ABNORMAL HIGH (ref 1.005–1.030)
Urobilinogen, UA: 0.2 mg/dL (ref 0.0–1.0)
Urobilinogen, UA: 1 mg/dL (ref 0.0–1.0)
pH: 6.5 (ref 5.0–8.0)

## 2010-09-05 LAB — CARDIAC PANEL(CRET KIN+CKTOT+MB+TROPI)
CK, MB: 1 ng/mL (ref 0.3–4.0)
Relative Index: INVALID (ref 0.0–2.5)
Total CK: 102 U/L (ref 7–232)
Troponin I: 0.01 ng/mL (ref 0.00–0.06)
Troponin I: 0.02 ng/mL (ref 0.00–0.06)

## 2010-09-05 LAB — BASIC METABOLIC PANEL
BUN: 19 mg/dL (ref 6–23)
BUN: 7 mg/dL (ref 6–23)
BUN: 9 mg/dL (ref 6–23)
CO2: 29 mEq/L (ref 19–32)
CO2: 29 mEq/L (ref 19–32)
CO2: 29 mEq/L (ref 19–32)
Calcium: 8.7 mg/dL (ref 8.4–10.5)
Calcium: 8.9 mg/dL (ref 8.4–10.5)
Chloride: 102 mEq/L (ref 96–112)
Chloride: 102 mEq/L (ref 96–112)
Chloride: 111 mEq/L (ref 96–112)
Creatinine, Ser: 1 mg/dL (ref 0.4–1.5)
Creatinine, Ser: 1.05 mg/dL (ref 0.4–1.5)
Creatinine, Ser: 1.08 mg/dL (ref 0.4–1.5)
Creatinine, Ser: 1.15 mg/dL (ref 0.4–1.5)
GFR calc Af Amer: >60 mL/min (ref >60)
GFR calc Af Amer: >60 mL/min (ref >60)
GFR calc non Af Amer: >60 mL/min (ref >60)
GFR calc non Af Amer: >60 mL/min (ref >60)
Glucose, Bld: 160 mg/dL — ABNORMAL HIGH (ref 70–99)
Glucose, Bld: 67 mg/dL — ABNORMAL LOW (ref 70–99)
Potassium: 3.7 mEq/L (ref 3.5–5.1)
Sodium: 136 mEq/L (ref 135–145)
Sodium: 145 mEq/L (ref 135–145)

## 2010-09-05 LAB — TROPONIN I: Troponin I: 0.02 ng/mL (ref 0.00–0.06)

## 2010-09-05 LAB — POCT CARDIAC MARKERS
CKMB, poc: <1 ng/mL — ABNORMAL LOW (ref 1.0–8.0)
Myoglobin, poc: 129 ng/mL (ref 12–200)
Troponin i, poc: <0.05 ng/mL (ref 0.00–0.09)

## 2010-09-05 LAB — LIPASE, BLOOD: Lipase: 15 U/L (ref 11–59)

## 2010-09-05 LAB — GASTRIC OCCULT BLOOD (1-CARD TO LAB): Occult Blood, Gastric: POSITIVE — AB

## 2010-09-05 LAB — HEMOGLOBIN AND HEMATOCRIT, BLOOD
Hemoglobin: 12 g/dL — ABNORMAL LOW (ref 13.0–17.0)
Hemoglobin: 12.4 g/dL — ABNORMAL LOW (ref 13.0–17.0)

## 2010-09-05 LAB — POCT I-STAT, CHEM 8
BUN: 17 mg/dL (ref 6–23)
Chloride: 102 mEq/L (ref 96–112)
Creatinine, Ser: 0.7 mg/dL (ref 0.4–1.5)
Glucose, Bld: 333 mg/dL — ABNORMAL HIGH (ref 70–99)
Hemoglobin: 14.3 g/dL (ref 13.0–17.0)
Potassium: 3.9 mEq/L (ref 3.5–5.1)
Sodium: 138 mEq/L (ref 135–145)

## 2010-09-05 LAB — KETONES, QUALITATIVE

## 2010-09-05 LAB — COMPREHENSIVE METABOLIC PANEL WITH GFR
ALT: 19 U/L (ref 0–53)
BUN: 15 mg/dL (ref 6–23)
Calcium: 9.3 mg/dL (ref 8.4–10.5)
Glucose, Bld: 336 mg/dL — ABNORMAL HIGH (ref 70–99)
Sodium: 138 meq/L (ref 135–145)
Total Protein: 7.8 g/dL (ref 6.0–8.3)

## 2010-09-05 LAB — URINE MICROSCOPIC-ADD ON

## 2010-09-05 LAB — POCT I-STAT 3, VENOUS BLOOD GAS (G3P V)
Acid-Base Excess: 1 mmol/L (ref 0.0–2.0)
Bicarbonate: 23.8 mEq/L (ref 20.0–24.0)
Patient temperature: 97.7

## 2010-09-05 LAB — URINE CULTURE

## 2010-09-05 LAB — CLOSTRIDIUM DIFFICILE EIA

## 2010-09-05 LAB — PROTIME-INR: Prothrombin Time: 14 seconds (ref 11.6–15.2)

## 2010-09-05 LAB — HEMOGLOBIN A1C
Hgb A1c MFr Bld: 7.7 % — ABNORMAL HIGH (ref <5.7)
Mean Plasma Glucose: 174 mg/dL — ABNORMAL HIGH (ref <117)

## 2010-09-05 LAB — HEMOCCULT GUIAC POC 1CARD (OFFICE): Fecal Occult Bld: NEGATIVE

## 2010-09-05 LAB — VANCOMYCIN, TROUGH: Vancomycin Tr: 14.6 ug/mL (ref 10.0–20.0)

## 2010-09-05 LAB — MAGNESIUM: Magnesium: 1.9 mg/dL (ref 1.5–2.5)

## 2010-09-05 NOTE — Assessment & Plan Note (Signed)
Summary: Office Visit (Infectious Disease)    Visit Type:  Follow-up Referring Provider:  Dr. Victorino Dike Primary Provider:  [name]   History of Present Illness: 60 year old male with multiple medical problems who underwent surgery for  Charcot joint with left mid foot osteotomy and also  lengthening of the left Achilles tendon on November 30, 2009 He was admitted in July with cellulitis and infection surounding hardware and found to be bacteremic with MSSA. He was treated with IV ancef at dose of 1g three times a day and oral rifampin twice daily, then changed to 2g iv three times a day with oral rifampin. His hardware was removed and we thought we were finished with this infection. Unfortunately he had persistent bony sequestrum of infection. He was admitted multiple times in the winter and was ultmately found to have a perforated gallbladder which was removed by CCS. Dr. Victorino Dike went back in and removed the bony sequestrum in January during his surgery for lap cholecystectomy. Cultures on antibiotics grew MSSA once again. The patient has finished more than 7 weeks of additional IV ancef and is doing well. The wound vac is out. he is without fevers or chills or systemic symptoms. He does continue to ahve nausea chronically which he attributes to hs diabetic gastroparesis.  Problems Prior to Update: 1)  Ulcer of Lower Limb, Unspecified  (ICD-707.10) 2)  Nausea and Vomiting  (ICD-787.01) 3)  Acute Osteomyelitis, Ankle and Foot  (ICD-730.07) 4)  Methicillin Susceptible Staph Aureus Septicemia  (ICD-038.11) 5)  Gerd  (ICD-530.81) 6)  Diabetes, Type 1  (ICD-250.01) 7)  Allergic Rhinitis, Seasonal  (ICD-477.0) 8)  Obstructive Sleep Apnea  (ICD-327.23)  Medications Prior to Update: 1)  Actos 30 Mg Tabs (Pioglitazone Hcl) .[name].. 1 Once Daily 2)  Januvia 100 Mg Tabs (Sitagliptin Phosphate) .[name].. 1 Once Daily 3)  Lyrica 75 Mg Caps (Pregabalin) .... As Needed During The Day 4)  Lyrica 300 Mg Caps (Pregabalin)  .[name].. 1 At Bedtime 5)  Cymbalta 60 Mg Cpep (Duloxetine Hcl) .[name].. 1 Once Daily 6)  Aspirin Buffered 325 Mg Tabs (Aspirin Buff(Mgcarb-Alaminoac)) .... As Needed 7)  [facility] Aspirin 81 Mg Tbec (Aspirin) .[name].. 1 Once Daily 8)  Pantoprazole Sodium 40 Mg Tbec (Pantoprazole Sodium) .[name].. 1 Two Times A Day 9)  Levemir 100 Unit/ml Soln (Insulin Detemir) .... Use As Directed 10)  Simvastatin 20 Mg Tabs (Simvastatin) .[name].. 1 At Bedtime 11)  Eq Pain Reliever Pm 25-500 Mg Tabs (Diphenhydramine-Apap (Sleep)) .... 2 At Bedtime 12)  Cerefolin Nac 5.6-2-600 Mg Tabs (Methylfol-Methylcob-Acetylcyst) .[name].. 1 At Bedtime 13)  Metanx 2.8-25-2 Mg Tabs (L-Methylfolate-B6-B12) .[name].. 1 Two Times A Day 14)  Metformin Hcl 500 Mg Tabs (Metformin Hcl) .... 2 Two Times A Day 15)  Quinapril Hcl 5 Mg Tabs (Quinapril Hcl) .[name].. 1 Once Daily 16)  B-50 Complex/vitamin C  Tabs (B Complex-C) .[name].. 1 Two Times A Day 17)  Cvs Arthritis Pain Relief 650 Mg Cr-Tabs (Acetaminophen) .[name].. 1 Every 8 Hours As Needed 18)  Humalog 100 Unit/ml Soln (Insulin Lispro (Human)) .... Sliding Scale Before Each Meal 19)  Cpap 13 20)  Lunesta 3 Mg Tabs (Eszopiclone) .[name].. 1 At Bedtime 21)  Keflex 500 Mg Caps (Cephalexin) .... Take Two Tablets Twice Daily  Current Medications (verified): 1)  Actos 30 Mg Tabs (Pioglitazone Hcl) .[name].. 1 Once Daily 2)  Januvia 100 Mg Tabs (Sitagliptin Phosphate) .[name].. 1 Once Daily 3)  Lyrica 75 Mg Caps (Pregabalin) .... As Needed During The Day  4)  Lyrica 300 Mg Caps (Pregabalin) .[name].. 1 At Bedtime 5)  Cymbalta 60 Mg Cpep (Duloxetine Hcl) .[name].. 1 Once Daily 6)  Aspirin Buffered 325 Mg Tabs (Aspirin Buff(Mgcarb-Alaminoac)) .... As Needed 7)  [facility] Aspirin 81 Mg Tbec (Aspirin) .[name].. 1 Once Daily 8)  Pantoprazole Sodium 40 Mg Tbec (Pantoprazole Sodium) .[name].. 1 Two Times A Day 9)  Levemir 100 Unit/ml Soln (Insulin Detemir) .... Use As Directed 10)  Simvastatin 20 Mg Tabs (Simvastatin) .[name].. 1 At Bedtime 11)  Eq Pain Reliever Pm  25-500 Mg Tabs (Diphenhydramine-Apap (Sleep)) .... 2 At Bedtime 12)  Cerefolin Nac 5.6-2-600 Mg Tabs (Methylfol-Methylcob-Acetylcyst) .[name].. 1 At Bedtime 13)  Metanx 2.8-25-2 Mg Tabs (L-Methylfolate-B6-B12) .[name].. 1 Two Times A Day 14)  Metformin Hcl 500 Mg Tabs (Metformin Hcl) .... 2 Two Times A Day 15)  Quinapril Hcl 5 Mg Tabs (Quinapril Hcl) .[name].. 1 Once Daily 16)  B-50 Complex/vitamin C  Tabs (B Complex-C) .[name].. 1 Two Times A Day 17)  Cvs Arthritis Pain Relief 650 Mg Cr-Tabs (Acetaminophen) .[name].. 1 Every 8 Hours As Needed 18)  Humalog 100 Unit/ml Soln (Insulin Lispro (Human)) .... Sliding Scale Before Each Meal 19)  Cpap 13 20)  Lunesta 3 Mg Tabs (Eszopiclone) .[name].. 1 At Bedtime 21)  Cephalexin 500 Mg Caps (Cephalexin) .... Take Two Tablets Twice A Day For Two More Weeks 22)  Hibiclens 4 % Liqd (Chlorhexidine Gluconate) .... Wash Once Daily With This Soap, Wait An Hour Before Applying Deodorant Disp One Bottle 23)  Mupirocin 2 % Oint (Mupirocin) .... Apply Ointment To Inside of Nostrils Twice Daily For 7 Days  Allergies: 1)  ! Neurontin 2)  ! Relafen 3)  Codeine    Current Allergies (reviewed today): ! NEURONTIN ! RELAFEN CODEINE Family History: Reviewed history from 05/26/2008 and no changes required. Heart dz- Mother Lung CA- Mother- died COPD and lung Ca- our patient  Social History: Reviewed history from 04/30/2008 and no changes required. 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  Review of Systems       see HPI otherwise negative on 12 pt review  Vital Signs:  Patient profile:   60 year old male Height:      75 inches (190.50 cm) Weight:      280.50 pounds (127.50 kg) BMI:     35.19 Temp:     97.6 degrees F (36.44 degrees C) oral Pulse rate:   96 / minute BP sitting:   100 / 70  (left arm)  Vitals Entered By: Starleen Arms CMA (August 30, 2010 8:51 AM)  Physical Exam  General:  alert and  well-developed.   Head:  normocephalic, atraumatic, and no abnormalities observed.   Eyes:  vision grossly intact, pupils equal, and pupils round.   Ears:  no external deformities.   Nose:  no external deformity.   Mouth:  no erythema and no exudates.   Neck:  supple and full ROM.   Lungs:  normal respiratory effort, no intercostal retractions, no accessory muscle use, no crackles, and no wheezes.   Heart:  normal rate, regular rhythm, no murmur, and no gallop.   Abdomen:  soft.  some tenderness and hyperacgtive bs in lower quadrants Msk:  see skin Neurologic:  alert & oriented X3 and sensation intact to pinprick.   Skin:  left leg with some chronic discoloration. Not hot, no fluctuance. The surgical site is clean and dry. PICC line CDI. Lap  chole sites all well healed as well Psych:  Oriented X3, memory intact for recent and remote, and normally interactive.     Impression & Recommendations:  Problem # 1:  ACUTE OSTEOMYELITIS, ANKLE AND FOOT (ICD-730.07) I will check ESR, crp today with safety labs. We will pull picc line today and put him on keflex 1g twice daily for two weeks at minimum. If esr and crp are still up we may extend this. The following medications were removed from the medication list:    Keflex 500 Mg Caps (Cephalexin) .[name]... Take two tablets twice daily His updated medication list for this problem includes:    Aspirin Buffered 325 Mg Tabs (Aspirin buff(mgcarb-alaminoac)) .[name]... As needed    [facility] Aspirin 81 Mg Tbec (Aspirin) .[name]... 1 once daily    Cvs Arthritis Pain Relief 650 Mg Cr-tabs (Acetaminophen) .[name]... 1 every 8 hours as needed    Cephalexin 500 Mg Caps (Cephalexin) .[name]... Take two tablets twice a day for two more weeks  Orders: T-Basic Metabolic Panel [id number]) T-CBC w/Diff [phone]) T-C-Reactive Protein [id number]) T-Sed Rate (Automated) [phone]) Est. Patient Level IV (09323)  Problem # 2:  METHICILLIN SUSCEPTIBLE STAPH AUREUS SEPTICEMIA  (ICD-038.11)  I will do a decontamination regimen with hibiclens and mupirocin on mr Pote for his MSSA  Orders: Est. Patient Level IV (55732)  Problem # 3:  PERFORATION OF GALLBLADDER (ICD-575.4)  sp lap chole. Finished a course of broad spectrum abx post removal of gbladder  Orders: Est. Patient Level IV (20254)  Medications Added to Medication List This Visit: 1)  Cephalexin 500 Mg Caps (Cephalexin) .... Take two tablets twice a day for two more weeks 2)  Hibiclens 4 % Liqd (Chlorhexidine gluconate) .... Wash once daily with this soap, wait an hour before applying deodorant disp one bottle 3)  Mupirocin 2 % Oint (Mupirocin) .... Apply ointment to inside of nostrils twice daily for 7 days  Patient Instructions: 1)  we will check labs today 2)  we will pull PICC line 3)  start oral keflex two tablets twice daily 4)  if labs are reassuring you will just need that for 2 wks total 5)  rtc to see Dr Daiva Eves in 6 wks  Prescriptions: MUPIROCIN 2 % OINT (MUPIROCIN) apply ointment to inside of nostrils twice daily for 7 days  #30 x 1   Entered and Authorized by:   Acey Lav MD   Signed by:   Paulette Blanch Dam MD on 08/30/2010   Method used:   Electronically to        [facility] (retail)       [address]       [place], [place]  [address]       Ph: [phone]       Fax: [phone]   RxID:   [id number] HIBICLENS 4 % LIQD (CHLORHEXIDINE GLUCONATE) wash once daily with this soap, wait an hour before applying deodorant disp one bottle  #1 x 2   Entered and Authorized by:   Acey Lav MD   Signed by:   Paulette Blanch Dam MD on 08/30/2010   Method used:   Electronically to        [facility] (retail)       [address]       [place], [place]  [id number]       Ph: [phone]       Fax: [phone]   RxID:   [id number]  CEPHALEXIN 500 MG CAPS (CEPHALEXIN) take two tablets twice a day for two more weeks   #56 x 1   Entered and Authorized by:   Acey Lav MD   Signed by:   Paulette Blanch Dam MD on 08/30/2010   Method used:   Electronically to        [facility] (retail)       [address]       [place], [place]  [address]       Ph: [phone]       Fax: [phone]   RxID:   [id number]   Appended Document: Office Visit (Infectious Disease)  Order obtained from Dr. Daiva Eves to draw labwork from [facility] line.  RN obatined appropriate specimens from [facility].  Order to remove PICC line obtained from Dr. Daiva Eves.  Pt. identified with name and date of birth.  PICC dressing removed, site unremarkable.  PICC line removed using sterile procedure @ 0930.   PICC length equal to that noted in pt's hospital chart of 42 cm.  Sterile petroleum gauze + sterile 4X4 applied to PICC site, pressure applied for 10 minutes and covered with Medipore tape as a pressure dressing.  Pt. instructed to limit use of arm for 1 hour.  Pt. instructed that the pressure dressing should remain in place for 24 hours.  Pt. verablized understanding of these instructions.

## 2010-09-06 LAB — POCT I-STAT, CHEM 8
Calcium, Ion: 1.09 mmol/L — ABNORMAL LOW (ref 1.12–1.32)
HCT: 39 % (ref 39.0–52.0)
Hemoglobin: 13.3 g/dL (ref 13.0–17.0)
TCO2: 22 mmol/L (ref 0–100)

## 2010-09-06 LAB — GLUCOSE, CAPILLARY

## 2010-09-18 LAB — CARDIAC PANEL(CRET KIN+CKTOT+MB+TROPI): Total CK: 75 U/L (ref 7–232)

## 2010-09-18 LAB — POCT I-STAT, CHEM 8
Creatinine, Ser: 0.8 mg/dL (ref 0.4–1.5)
Hemoglobin: 12.6 g/dL — ABNORMAL LOW (ref 13.0–17.0)
Sodium: 137 mEq/L (ref 135–145)
TCO2: 26 mmol/L (ref 0–100)

## 2010-09-18 LAB — CBC
HCT: 35.1 % — ABNORMAL LOW (ref 39.0–52.0)
Platelets: 201 10*3/uL (ref 150–400)
RBC: 3.76 MIL/uL — ABNORMAL LOW (ref 4.22–5.81)
WBC: 6.4 10*3/uL (ref 4.0–10.5)

## 2010-09-18 LAB — BASIC METABOLIC PANEL
BUN: 20 mg/dL (ref 6–23)
Calcium: 9.3 mg/dL (ref 8.4–10.5)
Chloride: 104 mEq/L (ref 96–112)
Creatinine, Ser: 0.92 mg/dL (ref 0.4–1.5)
GFR calc Af Amer: >60 mL/min (ref >60)
GFR calc Af Amer: >60 mL/min (ref >60)
GFR calc non Af Amer: >60 mL/min (ref >60)
Potassium: 3.6 mEq/L (ref 3.5–5.1)

## 2010-09-18 LAB — GLUCOSE, CAPILLARY
Glucose-Capillary: 103 mg/dL — ABNORMAL HIGH (ref 70–99)
Glucose-Capillary: 118 mg/dL — ABNORMAL HIGH (ref 70–99)
Glucose-Capillary: 119 mg/dL — ABNORMAL HIGH (ref 70–99)
Glucose-Capillary: 128 mg/dL — ABNORMAL HIGH (ref 70–99)

## 2010-09-21 LAB — GLUCOSE, CAPILLARY: Glucose-Capillary: 124 mg/dL — ABNORMAL HIGH (ref 70–99)

## 2010-09-21 LAB — LIPASE, BLOOD: Lipase: 27 U/L (ref 11–59)

## 2010-09-21 LAB — CBC
MCHC: 34.3 g/dL (ref 30.0–36.0)
MCV: 92.5 fL (ref 78.0–100.0)
Platelets: 216 10*3/uL (ref 150–400)
WBC: 8.6 10*3/uL (ref 4.0–10.5)

## 2010-09-21 LAB — COMPREHENSIVE METABOLIC PANEL
AST: 30 U/L (ref 0–37)
Albumin: 4.1 g/dL (ref 3.5–5.2)
Calcium: 10 mg/dL (ref 8.4–10.5)
Creatinine, Ser: 0.96 mg/dL (ref 0.4–1.5)
GFR calc Af Amer: >60 mL/min (ref >60)
Sodium: 138 mEq/L (ref 135–145)

## 2010-09-21 LAB — DIFFERENTIAL
Eosinophils Relative: 0 % (ref 0–5)
Lymphocytes Relative: 15 % (ref 12–46)
Lymphs Abs: 1.2 10*3/uL (ref 0.7–4.0)
Monocytes Absolute: 0.4 10*3/uL (ref 0.1–1.0)
Monocytes Relative: 5 % (ref 3–12)

## 2010-09-23 LAB — URINALYSIS, ROUTINE W REFLEX MICROSCOPIC
Bilirubin Urine: NEGATIVE
Glucose, UA: >1000 mg/dL — AB
Ketones, ur: >80 mg/dL — AB
Nitrite: NEGATIVE
pH: 7.5 (ref 5.0–8.0)

## 2010-09-23 LAB — HEMOCCULT GUIAC POC 1CARD (OFFICE): Fecal Occult Bld: NEGATIVE

## 2010-09-23 LAB — GASTRIC OCCULT BLOOD (1-CARD TO LAB)
Occult Blood, Gastric: POSITIVE — AB
pH, Gastric: 2

## 2010-09-23 LAB — COMPREHENSIVE METABOLIC PANEL
ALT: 19 U/L (ref 0–53)
Alkaline Phosphatase: 62 U/L (ref 39–117)
Glucose, Bld: 202 mg/dL — ABNORMAL HIGH (ref 70–99)
Potassium: 4 mEq/L (ref 3.5–5.1)
Sodium: 134 mEq/L — ABNORMAL LOW (ref 135–145)
Total Protein: 7.6 g/dL (ref 6.0–8.3)

## 2010-09-23 LAB — CBC
Hemoglobin: 15.1 g/dL (ref 13.0–17.0)
RBC: 4.86 MIL/uL (ref 4.22–5.81)
RDW: 13.3 % (ref 11.5–15.5)
WBC: 7.7 10*3/uL (ref 4.0–10.5)

## 2010-09-23 LAB — LIPASE, BLOOD: Lipase: 15 U/L (ref 11–59)

## 2010-09-23 LAB — GLUCOSE, CAPILLARY

## 2010-09-26 LAB — GLUCOSE, CAPILLARY
Glucose-Capillary: 129 mg/dL — ABNORMAL HIGH (ref 70–99)
Glucose-Capillary: 137 mg/dL — ABNORMAL HIGH (ref 70–99)
Glucose-Capillary: 144 mg/dL — ABNORMAL HIGH (ref 70–99)
Glucose-Capillary: 146 mg/dL — ABNORMAL HIGH (ref 70–99)
Glucose-Capillary: 147 mg/dL — ABNORMAL HIGH (ref 70–99)
Glucose-Capillary: 151 mg/dL — ABNORMAL HIGH (ref 70–99)
Glucose-Capillary: 161 mg/dL — ABNORMAL HIGH (ref 70–99)
Glucose-Capillary: 161 mg/dL — ABNORMAL HIGH (ref 70–99)
Glucose-Capillary: 169 mg/dL — ABNORMAL HIGH (ref 70–99)
Glucose-Capillary: 184 mg/dL — ABNORMAL HIGH (ref 70–99)
Glucose-Capillary: 188 mg/dL — ABNORMAL HIGH (ref 70–99)
Glucose-Capillary: 219 mg/dL — ABNORMAL HIGH (ref 70–99)
Glucose-Capillary: 222 mg/dL — ABNORMAL HIGH (ref 70–99)
Glucose-Capillary: 241 mg/dL — ABNORMAL HIGH (ref 70–99)
Glucose-Capillary: 323 mg/dL — ABNORMAL HIGH (ref 70–99)

## 2010-09-26 LAB — CBC
HCT: 33.5 % — ABNORMAL LOW (ref 39.0–52.0)
HCT: 34.5 % — ABNORMAL LOW (ref 39.0–52.0)
MCHC: 34.5 g/dL (ref 30.0–36.0)
MCV: 92.7 fL (ref 78.0–100.0)
Platelets: 238 10*3/uL (ref 150–400)
Platelets: 270 10*3/uL (ref 150–400)
RBC: 3.61 MIL/uL — ABNORMAL LOW (ref 4.22–5.81)
RBC: 4 MIL/uL — ABNORMAL LOW (ref 4.22–5.81)
WBC: 10.6 10*3/uL — ABNORMAL HIGH (ref 4.0–10.5)
WBC: 15.6 10*3/uL — ABNORMAL HIGH (ref 4.0–10.5)
WBC: 5.5 10*3/uL (ref 4.0–10.5)
WBC: 7.2 10*3/uL (ref 4.0–10.5)

## 2010-09-26 LAB — BASIC METABOLIC PANEL
BUN: 5 mg/dL — ABNORMAL LOW (ref 6–23)
BUN: 7 mg/dL (ref 6–23)
BUN: 8 mg/dL (ref 6–23)
CO2: 28 mEq/L (ref 19–32)
Calcium: 8 mg/dL — ABNORMAL LOW (ref 8.4–10.5)
Calcium: 8.6 mg/dL (ref 8.4–10.5)
Chloride: 103 mEq/L (ref 96–112)
Chloride: 105 mEq/L (ref 96–112)
Creatinine, Ser: 0.68 mg/dL (ref 0.4–1.5)
Creatinine, Ser: 0.76 mg/dL (ref 0.4–1.5)
GFR calc Af Amer: >60 mL/min (ref >60)
GFR calc Af Amer: >60 mL/min (ref >60)
GFR calc Af Amer: >60 mL/min (ref >60)
GFR calc Af Amer: >60 mL/min (ref >60)
GFR calc non Af Amer: >60 mL/min (ref >60)
GFR calc non Af Amer: >60 mL/min (ref >60)
GFR calc non Af Amer: >60 mL/min (ref >60)
GFR calc non Af Amer: >60 mL/min (ref >60)
Potassium: 3.1 mEq/L — ABNORMAL LOW (ref 3.5–5.1)
Potassium: 3.2 mEq/L — ABNORMAL LOW (ref 3.5–5.1)
Potassium: 3.5 mEq/L (ref 3.5–5.1)
Potassium: 4 mEq/L (ref 3.5–5.1)
Sodium: 134 mEq/L — ABNORMAL LOW (ref 135–145)

## 2010-09-26 LAB — HEMOGLOBIN AND HEMATOCRIT, BLOOD
HCT: 35.5 % — ABNORMAL LOW (ref 39.0–52.0)
Hemoglobin: 12.2 g/dL — ABNORMAL LOW (ref 13.0–17.0)

## 2010-09-26 LAB — DIFFERENTIAL
Lymphs Abs: 1.4 10*3/uL (ref 0.7–4.0)
Monocytes Relative: 8 % (ref 3–12)
Neutro Abs: 8.3 10*3/uL — ABNORMAL HIGH (ref 1.7–7.7)
Neutrophils Relative %: 78 % — ABNORMAL HIGH (ref 43–77)

## 2010-09-27 LAB — COMPREHENSIVE METABOLIC PANEL
ALT: 16 U/L (ref 0–53)
AST: 17 U/L (ref 0–37)
Albumin: 3.2 g/dL — ABNORMAL LOW (ref 3.5–5.2)
Alkaline Phosphatase: 63 U/L (ref 39–117)
BUN: 12 mg/dL (ref 6–23)
CO2: 25 mEq/L (ref 19–32)
Chloride: 97 mEq/L (ref 96–112)
Creatinine, Ser: 0.8 mg/dL (ref 0.4–1.5)
GFR calc Af Amer: >60 mL/min (ref >60)
Glucose, Bld: 203 mg/dL — ABNORMAL HIGH (ref 70–99)
Total Protein: 6.9 g/dL (ref 6.0–8.3)

## 2010-09-27 LAB — CULTURE, BLOOD (ROUTINE X 2)
Culture: NO GROWTH
Culture: NO GROWTH

## 2010-09-27 LAB — CBC
HCT: 42.3 % (ref 39.0–52.0)
Hemoglobin: 14.7 g/dL (ref 13.0–17.0)
MCHC: 34.8 g/dL (ref 30.0–36.0)
MCV: 92 fL (ref 78.0–100.0)
RBC: 4.6 MIL/uL (ref 4.22–5.81)
WBC: 12.1 10*3/uL — ABNORMAL HIGH (ref 4.0–10.5)

## 2010-09-27 LAB — URINALYSIS, ROUTINE W REFLEX MICROSCOPIC
Nitrite: NEGATIVE
Protein, ur: 100 mg/dL — AB
Specific Gravity, Urine: 1.042 — ABNORMAL HIGH (ref 1.005–1.030)
Urobilinogen, UA: 1 mg/dL (ref 0.0–1.0)

## 2010-09-27 LAB — URINE MICROSCOPIC-ADD ON

## 2010-09-27 LAB — POCT CARDIAC MARKERS

## 2010-09-27 LAB — DIFFERENTIAL
Basophils Relative: 1 % (ref 0–1)
Eosinophils Absolute: 0 10*3/uL (ref 0.0–0.7)
Monocytes Relative: 6 % (ref 3–12)
Neutrophils Relative %: 84 % — ABNORMAL HIGH (ref 43–77)

## 2010-09-27 LAB — GLUCOSE, CAPILLARY: Glucose-Capillary: 181 mg/dL — ABNORMAL HIGH (ref 70–99)

## 2010-09-28 LAB — CBC
HCT: 42.7 % (ref 39.0–52.0)
Hemoglobin: 14.4 g/dL (ref 13.0–17.0)
MCV: 93.3 fL (ref 78.0–100.0)
Platelets: 194 10*3/uL (ref 150–400)
RBC: 4.57 MIL/uL (ref 4.22–5.81)
WBC: 8 10*3/uL (ref 4.0–10.5)

## 2010-09-28 LAB — COMPREHENSIVE METABOLIC PANEL
BUN: 17 mg/dL (ref 6–23)
CO2: 24 mEq/L (ref 19–32)
Chloride: 98 mEq/L (ref 96–112)
Creatinine, Ser: 0.8 mg/dL (ref 0.4–1.5)
GFR calc non Af Amer: >60 mL/min (ref >60)
Glucose, Bld: 186 mg/dL — ABNORMAL HIGH (ref 70–99)
Total Bilirubin: 1.2 mg/dL (ref 0.3–1.2)

## 2010-09-28 LAB — URINALYSIS, ROUTINE W REFLEX MICROSCOPIC
Hgb urine dipstick: NEGATIVE
Ketones, ur: >80 mg/dL — AB
Nitrite: NEGATIVE
Urobilinogen, UA: 1 mg/dL (ref 0.0–1.0)

## 2010-09-28 LAB — GLUCOSE, CAPILLARY: Glucose-Capillary: 187 mg/dL — ABNORMAL HIGH (ref 70–99)

## 2010-09-28 LAB — DIFFERENTIAL
Basophils Absolute: 0.1 10*3/uL (ref 0.0–0.1)
Eosinophils Absolute: 0 10*3/uL (ref 0.0–0.7)
Lymphocytes Relative: 4 % — ABNORMAL LOW (ref 12–46)
Monocytes Relative: 3 % (ref 3–12)
Neutrophils Relative %: 92 % — ABNORMAL HIGH (ref 43–77)

## 2010-09-28 LAB — LIPASE, BLOOD: Lipase: 13 U/L (ref 11–59)

## 2010-10-11 ENCOUNTER — Ambulatory Visit: Payer: BLUE CROSS/BLUE SHIELD | Admitting: Infectious Disease

## 2010-10-31 NOTE — Op Note (Signed)
Samuel [name], Samuel [name] NO.:  [id number]   MEDICAL RECORD NO.:  [id number]          PATIENT TYPE:  INP   LOCATION:  1614                         FACILITY:  [facility]   PHYSICIAN:  Marlowe Kays, M.D.  DATE OF BIRTH:  1950/11/14   DATE OF PROCEDURE:  10/26/2008  DATE OF DISCHARGE:                               OPERATIVE REPORT   PREOPERATIVE DIAGNOSIS:  Diabetes mellitus with open wound distal  lateral portion of great toe and osteomyelitis of the distal phalanx on  MRI.   POSTOPERATIVE DIAGNOSIS:  Diabetes mellitus with open wound distal  lateral portion of great toe and osteomyelitis of the distal phalanx on  MRI.   OPERATION:  Partial amputation right great toe through the diaphyseal  portion of the proximal phalanx.   ASSISTANT:  Nurse.   ANESTHESIA:  General.   PATHOLOGY AND JUSTIFICATION FOR PROCEDURE:  He was originally the  hospital last week with cellulitis of the foot and great toe.  He has  been maintained on antibiotics since over the last few days as an  outpatient and he is here today for definitive surgical treatment.   PROCEDURE:  Satisfactory general anesthesia, pneumatic tourniquet with  the leg Esmarched out non sterilely and the tourniquet inflated to 350  mmHg.  The right foot and ankle and lower leg were prepped with DuraPrep  and draped in sterile field.  I first debrided up some of the eschar  over the distal portion of the toe with visualization of healthy tissue  beneath.  I then created a surgical incision with a flap based medially  and cut through this, disarticulating the great toe at the IP joint.  This portion of the resected area was sent out as specimen #1.  I then  cleaned up the soft tissue of any apparently infected or at least  distorted tissue and found that the flap would not cover and so with  subperiosteal dissection I peeled back the soft tissue over the distal  portion of the proximal phalanx and using the micro saw,  amputated the  proximal phalanx at about the mid distal third.  This allowed nice soft  tissue flap coverage without any undue tension.  Accordingly I covered  the raw bone with bone wax and then released the tourniquet.  There were  some minor bleeders which I cauterized.  The flap pinked up nicely and  the great toe appeared to be very viable.  Accordingly, after irrigating  the wound well with sterile saline, I closed the flap with interrupted 4-  0 nylon simple and mattress sutures.  Betadine, Adaptic and dry sterile  dressing were applied.  He tolerated the procedure well and was taken to  recovery in satisfactory condition with no known complications.           ______________________________  Marlowe Kays, M.D.     JA/MEDQ  D:  10/26/2008  T:  10/26/2008  Job:  [id number]

## 2010-10-31 NOTE — Op Note (Signed)
NAMEJERIN, Samuel [name] NO.:  [id number]   MEDICAL RECORD NO.:  [id number]          PATIENT TYPE:  INP   LOCATION:  1507                         FACILITY:  [facility]   PHYSICIAN:  Bernette Redbird, M.D.   DATE OF BIRTH:  Mar 20, 1951   DATE OF PROCEDURE:  10/20/2008  DATE OF DISCHARGE:                               OPERATIVE REPORT   PROCEDURE:  Upper endoscopy.   INDICATIONS:  A 60 year old diabetic patient with known history of  gastroparesis and, in recent weeks, protracted, intractable nausea and  vomiting.   FINDINGS:  Bile reflux.   PROCEDURE:  The nature, purpose and risks of the procedure were familiar  to the patient, and he was brought in a fasted state from his hospital  room to the [facility] Endoscopy Unit.  No topical pharyngeal  anesthesia was administered.  Sedation with fentanyl 50 mcg and Versed 5  mg IV was administered without clinical instability.  The Pentax adult  video endoscope was passed under direct vision.  The vocal cords were  not well seen.  The esophagus was entered without undue difficulty.  The  patient was freely refluxing bile up into the esophagus in association  with a lot of retching during the procedure.  The esophagus was  endoscopically normal.  No significant hiatal hernia was present, and  there was no evidence of reflux esophagitis, Barrett's esophagus,  varices, infection or neoplasia in the esophagus.   The stomach was entered.  It did not contain a significant residual, and  specifically, there was no retained food, but there was quite a bit of  bile coating the gastric surface, and the gastric mucosa had streaky,  patchy erythema, suggestive of bile reflux gastritis.  No erosions,  ulcers, polyps or masses were seen.  The patient was burping throughout  the procedure, and it was hard to retain air in the stomach to get a  good retroflexed view of the cardia.   The pylorus was nonstenotic and the duodenal bulb and  second duodenum  looked normal.   The scope was removed from the patient.  He tolerated the procedure  well, and there were no apparent complications.   IMPRESSION:  Bile reflux with gastric erythema consistent with bile  reflux gastritis.  This would be very compatible with his known history  of diabetic gastroparesis.  However, no additional cause for nausea and  vomiting identified, such as gastric outlet obstruction, peptic ulcer  disease, and so forth.   PLAN:  Gastroparesis diet.  Add sucralfate with the hope that it might  help diminish the nausea.           ______________________________  Bernette Redbird, M.D.     RB/MEDQ  D:  10/20/2008  T:  10/20/2008  Job:  [id number]   cc:   Veverly Fells. Altheimer, M.D.  Fax: [phone]

## 2010-10-31 NOTE — Op Note (Signed)
[name], [name] NO.:  [id number]   MEDICAL RECORD NO.:  [id number]          PATIENT TYPE:  OBV   LOCATION:  1621                         FACILITY:  [facility]   PHYSICIAN:  Erasmo Leventhal, M.D.DATE OF BIRTH:  21-Jun-1950   DATE OF PROCEDURE:  12/31/2006  DATE OF DISCHARGE:                               OPERATIVE REPORT   PREOPERATIVE DIAGNOSIS:  Left shoulder glenohumeral osteoarthritis,  possible labral tear, impingement syndrome, AC arthritis.   POSTOPERATIVE DIAGNOSIS:  Left shoulder glenohumeral osteoarthritis.  Rotator cuff impingement syndrome, AC arthritis.   PROCEDURE:  Left shoulder glenohumeral diagnostic arthroscopy,  arthroscopic subacromial decompression with acromioplasty, bursectomy  and CA ligament release, arthroscopic distal clavicle resection Mumford  procedure.   SURGEON:  Erasmo Leventhal, M.D.   ASSISTANT:  Leilani Able, PA-C.   ANESTHESIA:  Interscalene block, general.   ESTIMATED BLOOD LOSS:  Less than 10 mL.   DRAINS:  None.   COMPLICATIONS:  None.   DISPOSITION:  PACU stable.   DESCRIPTION OF PROCEDURE:  The patient is counseled in the holding area.  Correct side was identified.  IV was started, antibiotics given, block  was administered.  He was then taken operating room, placed in supine  position.  General anesthesia.  Turned to the right lateral decubitus  position, properly padded and bumped.  Left shoulder examined,  excellent range of motion, stable.  Prepped with DuraPrep and draped in  a sterile fashion.  Overhead shoulder positioner was utilized, 30  degrees abduction, 10 degrees forward flexion, 15 pounds longitudinal  traction.  Posterior portal was created.  Arthroscope placed to the  glenohumeral joint.  Diagnostic arthroscopy revealed glenohumeral OA  with noticeable chondromalacia of both the glenoid and the humeral head,  synovium, rotator cuff, capsule intact.  Longitudinal ligaments normal  and there was no labral tear.  Irrigated and all arthroscopic equipment  was removed.  Subacromial region revealed thick subacromial bursitis and  subacromial resection through the lateral portal with motorized shaver.  ArthroCare system was utilized to release the periosteum and CA  ligament.  A bur was then placed posteriorly and an anterior-inferior  acromioplasty was performed converting him to a flat acromion  morphology.  AC joint was found to be markedly osteoarthritic with  medial acromion and lateral clavicular spurs.  Accessory anterior portal  was made.  The medial 5 mm of the acromion was removed and then coplaned  to the clavicle, removing approximately 5-8 mm of clavicle  circumferentially.  Then the superior and posterior acromioclavicular  ligaments and capsule intact.  Arthroscopic debris was removed.  Hemostasis obtained.  Clavicle was felt to be stable.  Irrigated and  arthroscopic equipment was removed.  Also note that the rotator cuff and  the bursal surface was intact.  Taken out of traction.  Three portals  were closed with 4-0 nylon suture.  Another 50 mL of 0.25% Marcaine and  epinephrine was placed in the portal sites in subacromial region as per  anesthesia.  Sterile dressing applied.  Regular sling, turned supine,  awakened and taken to operating room PACU  in stable condition.   PLAN:  Subject to PACU in overnight stay due to sleep apnea.  All  questions were encouraged and answered in detail with the patient's  family after surgery.   Help with surgical technique and decision making by Mr. Leilani Able, PA-  C, assistance was needed throughout the entire case.           ______________________________  Erasmo Leventhal, M.D.     RAC/MEDQ  D:  12/31/2006  T:  12/31/2006  Job:  [id number]

## 2010-10-31 NOTE — H&P (Signed)
Samuel [name], MARLING NO.:  [id number]   MEDICAL RECORD NO.:  [id number]          PATIENT TYPE:  INP   LOCATION:  0111                         FACILITY:  [facility]   PHYSICIAN:  Pedro Earls, MD     DATE OF BIRTH:  06/27/1950   DATE OF ADMISSION:  10/15/2008  DATE OF DISCHARGE:                              HISTORY & PHYSICAL   CHIEF COMPLAINT:  Weakness and nausea and vomiting.   HISTORY OF PRESENT ILLNESS:  This is a 60 year old white male patient  with a past medical history significant for poorly-controlled diabetes,  complicated by diabetic neuropathy, who was apparently asymptomatic  until 5 days ago when he started having nausea and persistent vomiting.  The patient continued to stay at home, continued to take some oral  intake especially the fluids, but was not able to keep it down longer  than 15 minutes and everything he was eating was coming right back out.  The patient was also not able to take all his medication.  He was taking  Tylenol PM and Lantus, but was not able to take the rest of his  medication for past 5 days as well.  The patient denies any fever or  chills, although feels warm complaining of weakness, some nausea and  vomiting, multiple episodes per day which had converted to some blood  tinged vomitus as well today.   Denies any diarrhea, any shortness of breath or chest pain.   REVIEW OF SYSTEMS:  As above.  Rest of the review of systems are  negative.   PAST MEDICAL HISTORY:  1. Poorly controlled type 2 diabetes.  2. Obesity.  3. Chronic fatigue syndrome.  4. Diabetic gastroparesis.  5. Diabetic neuropathy.  6. Sleep apnea.  7. Hypercholesteremia.  8. Depression.  9. GERD.   MEDICATIONS:  1. Actos 30 daily.  2. Lyrica 300 daily.  3. Metformin 1 gram b.i.d.  4. Lantus 70 q.a.m.  5. Humalog sliding scale.  6. Protonix 40 b.i.d.  7. Simvastatin 20 q.h.s.  8. Amitriptyline 25 daily.  9. Quinapril 5 mg daily.  10.Reglan 5  t.i.d.  11.Cymbalta 60 daily.  12.Januvia 100 mg daily.  13.Aspirin 81 daily.  14.Fish oil one tablet daily.  15.Tylenol PM 2 tablets q.h.s.   PAST SURGICAL HISTORY:  1. Achilles tendon repair.  2. Endoscopy.   FAMILY HISTORY:  Positive for COPD and coronary artery disease in  mother.   SOCIAL HISTORY:  Negative for smoking, alcohol, IV drug abuse.   ALLERGIES:  CODEINE AND NEURONTIN.   PHYSICAL EXAMINATION:  VITALS:  Temperature is 98.7, respirations 20-24,  pulse 104-108, blood pressure 138-144/80s, pulse ox 96% on room air.  GENERAL:  Patient is awake, alert, oriented x3.  Does appear to be some  distress secondary to the nausea.  HEENT:  Pupils equal, round reactive to light.  Extraocular movements  intact.  oral mucosa is Dry.  NECK:  Supple.  No JVD.  No lymphadenopathy.  CVS:.  S1, S2 sinus tachycardia.  No murmurs, heaves or gallops.  CHEST:  Clear.  ABDOMEN:  Soft,  obese, some tenderness in the deep palpation in  epigastric area.  No rebound.  Bowel sounds present.  No  hepatosplenomegaly.  EXTREMITIES:  No clubbing, cyanosis.  The patient has blunt edema at  right lower extremity.  There is a superficial tenia infection on the  plantar aspect of the right foot and the swelling and erythema with  edema extends all the way up to the mid shin.  The area is very hot to  touch.  CNS:  Sensory motor grossly intact.  The patient does not have any  sensations below the ankle bilaterally.  Otherwise, sensations are  intact.  Motor exam grossly intact.  Cranial nerves II-XII intact.  SKIN:  As above.  MUSCULOSKELETAL:  Unremarkable.   LABORATORY DATA:  Potassium is 3.2, creatinine 0.80, blood sugar 203.  White count is 12.5 with a left shift.  Troponin negative.  Abdominal x-  ray was unremarkable.   IMPRESSION:  1. Acute right lower extremity cellulitis.  2. Diabetic gastroparesis acute.  3. Vomiting.  4. Uncontrolled type 2 diabetes.  5. Hypertension.  6.  Leukocytosis.  7. Obstructive sleep apnea.  8. Obesity.  9. Diabetic neuropathy.   PLAN:  Admit to [facility], inpatient [facility].  IV Ancef 1 gram  q.6, IV Zofran, IV Reglan and Protonix.  Obtain blood cultures x2.  Clear liquid diet, ADA, advance as tolerated.  Continue patient's  Lantus, hold Actos, metformin and Januvia.  Insulin sliding scale  coverage with Accu-Cheks a.c. h.s.  IV fluids with potassium.      Pedro Earls, MD  Electronically Signed     NS/MEDQ  D:  10/15/2008  T:  10/15/2008  Job:  [id number]   cc:   Bernette Redbird, M.D.  Fax: [phone]   Dr.  Rodman Key

## 2010-10-31 NOTE — Discharge Summary (Signed)
NAMEOHM, DENTLER NO.:  [id number]   MEDICAL RECORD NO.:  [id number]          PATIENT TYPE:  INP   LOCATION:  1507                         FACILITY:  [facility]   PHYSICIAN:  Isidor Holts, M.D.  DATE OF BIRTH:  05-Dec-1950   DATE OF ADMISSION:  10/15/2008  DATE OF DISCHARGE:                               DISCHARGE SUMMARY   ADDENDUM:   DATE OF DISCHARGE:  Oct 22, 2008.   PRIMARY MEDICAL DOCTOR:  Delaney Meigs, M.D. [place], [place].   PRIMARY GASTROENTEROLOGIST:  Bernette Redbird, M.D.   For discharge diagnoses, refer to interim summary dictated Oct 19, 2008,  by Dr. Midge Minium.  Also refer to above mentioned interim summary  for details of admission history, clinical course, procedures and  consultations.  For the period however, from Oct 20, 2008, to Oct 21, 2008, the following are pertinent:  The patient underwent EGD on Oct 20, 2008, by Dr. Bernette Redbird and this  showed bile reflux with gastric erythema, consistent with bile reflux  gastritis, felt to be compatible with his known history of diabetic  gastroparesis.  There was however no additional cause for nausea and  vomiting identified, such as gastric outlet obstruction, peptic ulcer,  etc.  The patient was recommended to utilize a gastroparesis diet and  Sucralfate was added to his medications.  This resulted in satisfactory  clinical response.  The patient's nausea resolved.  In addition, redness  and swelling of right foot diminished steadily, and was dramatically  improved by Oct 21, 2008.  Diabetes mellitus has remained under control  with CBGs ranging from 140-167 on scheduled Lantus insulin, sliding  scale insulin coverage and carb modified diet.  Pre-admission doses of  Actos, Januvia and Metformin have been restarted, and further  improvement is anticipated.  As of Oct 21, 2008, the patient's hydration  status was satisfactory.  He was eating and drinking adequately, and  has  continued on statin treatment for dyslipidemia.  He was seen on Oct 21, 2008, by Dr. Simonne Come, orthopedic surgeon, who opined that the patient  is close to surgical intervention.  He recommends continued antibiotic  treatment and will see the patient on Oct 25, 2008, to make a surgical  decision.   DISPOSITION:  The patient was considered stable on Oct 21, 2008, for  discharge to be considered on Oct 22, 2008, followed by orthopedic re-  evaluation on Oct 25, 2008, for surgical decision to be made.  The  patient is agreeable to this plan, and provided no acute problems rise  in the interim, he will be discharged accordingly.   DISCHARGE MEDICATIONS:  1. Actos 30 mg p.o. daily.  2. Lyrica 300 mg p.o. daily.  3. Metformin 1000 mg p.o. b.i.d.  4. Lantus 70 units subcutaneously q.a.m.  5. Protonix 40 mg p.o. b.i.d.  6. Simvastatin 20 mg p.o. q.h.s.  7. Amitriptyline 25 mg p.o. daily.  8. Quinapril 5 mg p.o. daily.  9. Reglan 5 mg p.o. q.i.d.  10.Cymbalta 60 mg p.o. daily.  11.Januvia 100 mg p.o. daily.  12.Aspirin 81 mg p.o. daily.  13.Fish oil 1 tablet p.o. daily.  14.Tylenol PM two tabs p.o. p.r.n. q.h.s.  15.Humalog insulin per sliding scale per preadmission regimen.  16.IV vancomycin 2000 mg q.12 hourly.  17.Ciprofloxacin 400 mg IV q.12 hourly.  18.Carafate 1 g p.o. q.i.d.  19.Vicodin 5/325 one p.o. p.r.n. q.4 hourly.#60.  20.Phenergan 25 mg p.o p.r.n q.6 hourly.   DIET:  Heart healthy/carbohydrate modified diet.   ACTIVITY:  As tolerated, otherwise per PT/OT.   WOUND CARE:  Daily wet-to-dry dressings.   FOLLOWUP INSTRUCTIONS:  The patient is to follow up with Dr. Simonne Come,  [facility], on Oct 25, 2008, at 8:00am, telephone number [phone].  In addition he is to follow up with his primary MD, Dr.  Joette Catching, per prior scheduled appointment, and with his primary  gastroenterologist, Dr. Molly Maduro Buccini per prior scheduled appointment.   [phone].   SPECIAL INSTRUCTIONS:  Home health PT/OT and RN for IV antibiotics and  wound dressings, have been arranged.      Isidor Holts, M.D.  Electronically Signed     CO/MEDQ  D:  10/21/2008  T:  10/21/2008  Job:  [id number]   cc:   Delaney Meigs, M.D.  Fax: [phone]   Bernette Redbird, M.D.  Fax: [phone]   Marlowe Kays, M.D.  Fax: [phone]

## 2010-10-31 NOTE — Group Therapy Note (Signed)
NAMEMarland Kitchen  Samuel [name], Samuel [name] NO.:  [id number]   MEDICAL RECORD NO.:  [id number]          PATIENT TYPE:  INP   LOCATION:  1507                         FACILITY:  [facility]   PHYSICIAN:  Eduard Clos, MDDATE OF BIRTH:  12/25/1950                                 PROGRESS NOTE   COURSE IN THE HOSPITAL:  A 60 year old male with known history of diabetes mellitus, type 2,  gastroparesis, diabetic neuropathy came with weakness, nausea, and  vomiting.  In addition, patient was found to have a right lower  extremity swelling in the ankle, foot, and right greater toe.  Patient  was admitted, started on IV fluids for his gastroparesis and started on  empiric antibiotics for his cellulitis.  His fever has actually got  worse so antibiotics were changed from Ancef to vanc and Cipro.  An MRI  was done.  MRI was showing abscess and osteomyelitis in the right  greater toe.  Orthopedic consult was obtained with [facility].  As per Orthopedics, patient will need eventually  amputation of his greater toe after his cellulitis subsides.   Patient is supposed to get an outpatient EGD on Oct 19, 2008, and GI, Dr.  Matthias Hughs, has already seen the patient and is possibly going to get an  EGD inpatient at this time.  Patient's nausea and vomiting actually has  improved now.  Blood cultures obtained during the stay have been  negative so far.  Patient is on vanc and Cipro.  At the time of the  dictation, patient is hemodynamically stable.   PROCEDURES DONE DURING THIS STAY:  1. Chest x-ray on Oct 19, 2008, shows right-sided PICC line, consider      advancement approximately 4 cm, no pneumothorax, decreased lung      volumes with increase in left base atelectasis.  2. MRI of the right lower extremity on Oct 18, 2008, showed marked      cellulitis and myositis of the foot with abscess formation along      the distal aspect of great toe, findings consistent with      osteomyelitis and  through the distal phalanx of the great toe.  3. MRI of the right lower extremity, ankle, on Oct 18, 2008, showed      findings consistent with cellulitis above the ankle, negative for      abscess, osteomyelitis, __________tendinopathy without tear.   PERTINENT LABS:  Patient's WBC on Oct 19, 2008, is 15.6.  His admitting blood sugar was  203.  Uric acid level was 4.5.  Lipase level less than 10.  Troponins  were negative.   FINAL DIAGNOSES:  1. Osteomyelitis and abscess of the right great toe.  2. Cellulitis, right foot.  3. Diabetic gastroparesis.  4. Dehydration.  5. Diabetes mellitus, type 2.  6. History of hyperlipidemia.   PLAN:  At this time, patient is going to get an EGD through Dr. Matthias Hughs which  was actually scheduled outpatient but will be  done inpatient now.  We will follow Orthopedic recommendations,  presently continue vanc and Cipro, and further recommendations will  be  dictated by the oncoming M.D.      Eduard Clos, MD  Electronically Signed     ANK/MEDQ  D:  10/19/2008  T:  10/19/2008  Job:  [id number]

## 2010-11-01 ENCOUNTER — Encounter (HOSPITAL_COMMUNITY)
Admission: RE | Admit: 2010-11-01 | Discharge: 2010-11-01 | Disposition: A | Discharge status: Home / Self Care | Payer: Medicare Other | Source: Ambulatory Visit | Attending: Orthopedic Surgery | Admitting: Orthopedic Surgery

## 2010-11-01 LAB — CBC
MCV: 85.4 fL (ref 78.0–100.0)
Platelets: 184 10*3/uL (ref 150–400)
RDW: 15.6 % — ABNORMAL HIGH (ref 11.5–15.5)
WBC: 6.3 10*3/uL (ref 4.0–10.5)

## 2010-11-01 LAB — BASIC METABOLIC PANEL
BUN: 22 mg/dL (ref 6–23)
CO2: 31 mEq/L (ref 19–32)
Chloride: 101 mEq/L (ref 96–112)
Creatinine, Ser: 1.2 mg/dL (ref 0.4–1.5)

## 2010-11-01 LAB — APTT: aPTT: 31 seconds (ref 24–37)

## 2010-11-02 ENCOUNTER — Encounter: Payer: Self-pay | Admitting: Infectious Disease

## 2010-11-03 ENCOUNTER — Inpatient Hospital Stay (HOSPITAL_COMMUNITY)
Admission: RE | Admit: 2010-11-03 | Discharge: 2010-11-08 | DRG: 982 | Disposition: A | Discharge status: Home Health | Payer: Medicare Other | Source: Ambulatory Visit | Attending: Orthopedic Surgery | Admitting: Orthopedic Surgery

## 2010-11-03 ENCOUNTER — Ambulatory Visit (HOSPITAL_COMMUNITY): Payer: Medicare Other

## 2010-11-03 DIAGNOSIS — Z951 Presence of aortocoronary bypass graft: Secondary | ICD-10-CM

## 2010-11-03 DIAGNOSIS — E1149 Type 2 diabetes mellitus with other diabetic neurological complication: Secondary | ICD-10-CM | POA: Diagnosis present

## 2010-11-03 DIAGNOSIS — E1169 Type 2 diabetes mellitus with other specified complication: Principal | ICD-10-CM | POA: Diagnosis present

## 2010-11-03 DIAGNOSIS — M9080 Osteopathy in diseases classified elsewhere, unspecified site: Secondary | ICD-10-CM | POA: Diagnosis present

## 2010-11-03 DIAGNOSIS — E669 Obesity, unspecified: Secondary | ICD-10-CM | POA: Diagnosis present

## 2010-11-03 DIAGNOSIS — K219 Gastro-esophageal reflux disease without esophagitis: Secondary | ICD-10-CM | POA: Diagnosis present

## 2010-11-03 DIAGNOSIS — G4733 Obstructive sleep apnea (adult) (pediatric): Secondary | ICD-10-CM | POA: Diagnosis present

## 2010-11-03 DIAGNOSIS — E785 Hyperlipidemia, unspecified: Secondary | ICD-10-CM | POA: Diagnosis present

## 2010-11-03 DIAGNOSIS — M869 Osteomyelitis, unspecified: Secondary | ICD-10-CM

## 2010-11-03 DIAGNOSIS — I10 Essential (primary) hypertension: Secondary | ICD-10-CM | POA: Diagnosis present

## 2010-11-03 DIAGNOSIS — F3289 Other specified depressive episodes: Secondary | ICD-10-CM | POA: Diagnosis present

## 2010-11-03 DIAGNOSIS — F329 Major depressive disorder, single episode, unspecified: Secondary | ICD-10-CM | POA: Diagnosis present

## 2010-11-03 DIAGNOSIS — G609 Hereditary and idiopathic neuropathy, unspecified: Secondary | ICD-10-CM | POA: Diagnosis present

## 2010-11-03 DIAGNOSIS — I2510 Atherosclerotic heart disease of native coronary artery without angina pectoris: Secondary | ICD-10-CM | POA: Diagnosis present

## 2010-11-03 DIAGNOSIS — K3184 Gastroparesis: Secondary | ICD-10-CM | POA: Diagnosis present

## 2010-11-03 LAB — GLUCOSE, CAPILLARY: Glucose-Capillary: 115 mg/dL — ABNORMAL HIGH (ref 70–99)

## 2010-11-03 NOTE — Discharge Summary (Signed)
NAME:  Samuel [name], Samuel [name] NO.:  [id number]   MEDICAL RECORD NO.:  [id number]                   PATIENT TYPE:  INP   LOCATION:  11/14/16                                 FACILITY:  [facility]   PHYSICIAN:  Nicki Guadalajara, M.D.                  DATE OF BIRTH:  1951-03-13   DATE OF ADMISSION:  06/16/2002  DATE OF DISCHARGE:  06/17/2002                                 DISCHARGE SUMMARY   DISCHARGE DIAGNOSES:  1. Chest pain nonspecific.  2. Nausea and vomiting resolved.  3. Diabetes type 2.  4. Gastroesophageal reflux disease.  5. Diabetic neuropathy.  6. Patent coronary arteries, except for minimal 20% stenosis at the left     anterior descending artery.   CONDITION ON DISCHARGE:  Improved.   PROCEDURE:  June 16, 2002, combined left heart catheterization by Dr.  Nicki Guadalajara.   DISCHARGE MEDICATIONS:  1. Enteric-coated aspirin 81 mg q.d.  2. Protonix 40 mg q.d.  3. Stop the Prilosec.  4. Altace 2.5 mg 1 q.d.  5. Glucotrol as before.  6. Tegretol as before.   DISCHARGE INSTRUCTIONS:  No strenuous activity.  No lifting over 10 pounds.  No work until cleared by primary doctor.  Low fat, low salt diabetic diet.  Wash catheterization site with soap and water.  Call for any bleeding,  swelling, or drainage.  See primary doctor, Dr. Madelin Rear. Fusco, at  [facility] next week.   HISTORY OF PRESENT ILLNESS:  A 60 year old white single male with a history  of diabetes, reflux disease, and neuropathy.  The patient was admitted to  [facility] on June 16, 2002 by Dr. Nicki Guadalajara for  chest pain.  He had been having three episodes of the chest pain over the  last two months, awakens with midsternal chest pressure though it does not  actually awaken him from sleep.  It radiates through to his back.  No nausea  and vomiting until after he came by EMS to [facility] and  then he was vomiting light liquid.  His only other  associated symptom was  his chest pain with shortness of breath described as a squeezing pressure,  increased with the breath and bending over.   EKG in the emergency room with nonspecific changes.   PAST MEDICAL HISTORY:  1. No cardiac history, though positive family history.  2. Diabetes mellitus type 2, noninsulin-dependent.  3. Neuropathy secondary to #2.  4. History of gastroesophageal reflux disease, possible hiatal hernia.   PAST SURGICAL HISTORY:  Left Achilles tendon repair.   ALLERGIES:  CODEINE and NEURONTIN.   MEDICATIONS:  Glucotrol, Prilosec, and Tegretol.   FAMILY HISTORY:  Mother is alive and has a history of coronary disease with  bypass grafting in 11-14-84.  Father died without problems.  Three brothers  alive and well.   SOCIAL HISTORY:  No tobacco.  Very little alcohol.  He is a truckdriver.   PHYSICAL EXAMINATION:  VITAL SIGNS:  Prior to discharge, blood pressure  114/70, pulse 72, respirations 20, afebrile.  GENERAL:  Alert and oriented white male in no acute distress.  No chest  pain, no shortness of breath.  NECK:  No JVD.  LUNGS:  No rales.  HEART:  S1 and S2, regular rate and rhythm without S3 gallop.  ABDOMEN:  Soft and nontender.  Positive bowel sounds.  Right groin stable.  No hematoma.  EXTREMITIES:  No edema.  Pulses are present.   LABORATORY DATA:  EKG today normal sinus rhythm.  Admitting labs show  hemoglobin 16, hematocrit 46, WBC 7.5, platelets 225,000, neutrophils 60,  lymphs 28, monos 70, EOS 2 (these remain stable).  Pro time on admission is  12.7, INR of 0.9, and PTT 24.  Chemistry showed sodium 138, potassium 3.5,  chloride 101, CO2 28, glucose 184, BUN 17, creatinine 0.8.  Total bilirubin  is 1, direct bilirubin 0.1, indirect bilirubin 0.9, alkaline phosphate 73,  SGOT 32, SGPT 31, total protein 7.7, and albumin 4.1.  Calcium 8.5,  magnesium 2, amylase 44, lipase was 18.  Cardiac enzymes were negative x 3  76 to 97 and MBs 0.8 to 0.9.   Troponin less than 0.01 to 0.02.  TSH was  0.825.  Lipid panel is pending.   Cardiac catheterization reveals patent coronary arteries except for 20%  stenosis of LAD.  LV function was normal.   HOSPITAL COURSE:  The patient was admitted June 16, 2002 by Dr. Nicki Guadalajara with chest pain.  Once he got to [facility]  Emergency Room, he was having a lot of vomiting.  No acute EKG changes,  nonspecific ST changes only.  He was given Zofran, Phenergan, and Morphine  as well as nitroglycerin drip.  Full relief was obtained of his chest pain.  He was then taken to the cardiac catheterization electively for cardiac  catheterization with results as stated previously.   The patient's symptoms resolved, and by June 17, 2002, he was much  improved.  No shortness of breath, chest pain, and no nausea.  EKG was  stable.  LFTs were within normal limits.  We changed his Prilosec to  Protonix  secondary to his diabetes.  We added Altace 2.5 mg q.d.  His lipids are not  currently back.  He will be discharged home by Dr. Nicki Guadalajara and follow  up as an outpatient with [facility].  We would be glad to see him as  needed with cardiology.      Darcella Gasman. [place], N.P.                     Nicki Guadalajara, M.D.    LRI/MEDQ  D:  06/17/2002  T:  06/17/2002  Job:  [id number]   cc:   Nicki Guadalajara, M.D.  [phone] [address]  [place], [place] [address]  Fax: [phone]   Madelin Rear. Sherwood Gambler, M.D.  [address]  [place]  [place] [address]  Fax: [phone]

## 2010-11-03 NOTE — Cardiovascular Report (Signed)
NAME:  NATHANIAL, ARRIGHI NO.:  [id number]   MEDICAL RECORD NO.:  [id number]                   PATIENT TYPE:  INP   LOCATION:  1827                                 FACILITY:  [facility]   PHYSICIAN:  Nicki Guadalajara, M.D.                  DATE OF BIRTH:  1950-12-06   DATE OF PROCEDURE:  06/16/2002  DATE OF DISCHARGE:                              CARDIAC CATHETERIZATION   INDICATION:  .  The patient is a 60 year old white male whose cardiac risk factors include  type 2 diabetes mellitus, as well as strong family history of coronary  artery disease with a mother who is status post CABG surgery.  The patient  has developed intermittent chest pain for the last several days.  He had  significant chest pain last evening with chest pain occurring intermittently  this morning, prompting his evaluation at [facility].  ECG at  the office did not show acute change, but due to the ongoing chest pain, he  was transported to [facility] for further evaluation.   The patient did experience some nausea, and upon arrival did have some  residual chest discomfort which did improve with nitroglycerin, and did  vomit in the emergency room.  Because of his risk factors and worrisome  chest discomfort, definitive catheterization was recommended.   DESCRIPTION OF PROCEDURE:  After premedication with Versed intravenously,  the patient was prepped and draped in the usual fashion.  Right femoral artery was punctured anteriorly and a 6 French sheath was  inserted.  Diagnostic catheterization was done with a 6 French Judkins left  and right coronary catheters.  A pigtail catheter was used for biplane cine  left ventriculography as well as distal aortography.  Hemostasis was  obtained by direct manual pressure   HEMODYNAMIC DATA:  Central aortic pressure 125/74.  Left ventricular  pressure 125/7, post A wave 16.   ANGIOGRAPHIC DATA:  The left main coronary artery was  angiographically  normal and bifurcated into an LAD and left circumflex system.   The LAD had ostial 20% narrowing followed by a 20% proximal narrowing before  the first diagonal takeoff.  There were diffuse luminal irregularities in  the mid LAD without significant obstruction.   The circumflex vessel was a moderate sized vessel that gave rise to one  large marginal vessel which was free of significant disease.   Right coronary artery was angiographically normal and gave rise to a  moderate sized PDA system and a small posterolateral system.   Biplane cine left ventriculography revealed normal LV function without focal  segmental wall motion abnormalities.  There was no suggestion of any  dilatation of the ascending aorta on left ventriculography.   DISTAL AORTOGRAM:  Distal aortography did not demonstrate any aortic  enlargement.  There was no renal artery stenosis.  There was no significant  aortoiliac disease.  IMPRESSION:  1. Normal left ventricular function  2. 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                                                  Nicki Guadalajara, M.D.    TK/MEDQ  D:  06/16/2002  T:  06/17/2002  Job:  [id number]   cc:   Madelin Rear. Sherwood Gambler, M.D.  [address]  [place]  [place] [address]  Fax: [phone]   Janeece Riggers   Cardiac Catheterization Laboratory   [facility] Mustard, P.A.

## 2010-11-04 LAB — BASIC METABOLIC PANEL
Chloride: 103 mEq/L (ref 96–112)
GFR calc Af Amer: >60 mL/min (ref >60)
Potassium: 4.2 mEq/L (ref 3.5–5.1)
Sodium: 138 mEq/L (ref 135–145)

## 2010-11-04 LAB — CBC
Hemoglobin: 9.6 g/dL — ABNORMAL LOW (ref 13.0–17.0)
MCH: 28.8 pg (ref 26.0–34.0)
RBC: 3.33 MIL/uL — ABNORMAL LOW (ref 4.22–5.81)

## 2010-11-04 LAB — GLUCOSE, CAPILLARY
Glucose-Capillary: 144 mg/dL — ABNORMAL HIGH (ref 70–99)
Glucose-Capillary: 92 mg/dL (ref 70–99)

## 2010-11-05 LAB — BASIC METABOLIC PANEL
BUN: 14 mg/dL (ref 6–23)
CO2: 25 mEq/L (ref 19–32)
Calcium: 8.6 mg/dL (ref 8.4–10.5)
Glucose, Bld: 109 mg/dL — ABNORMAL HIGH (ref 70–99)
Potassium: 4.2 mEq/L (ref 3.5–5.1)
Sodium: 135 mEq/L (ref 135–145)

## 2010-11-05 LAB — CBC
HCT: 27.6 % — ABNORMAL LOW (ref 39.0–52.0)
MCHC: 33 g/dL (ref 30.0–36.0)
MCV: 85.4 fL (ref 78.0–100.0)
RBC: 3.23 MIL/uL — ABNORMAL LOW (ref 4.22–5.81)
WBC: 6.4 10*3/uL (ref 4.0–10.5)

## 2010-11-05 LAB — GLUCOSE, CAPILLARY: Glucose-Capillary: 95 mg/dL (ref 70–99)

## 2010-11-06 LAB — GLUCOSE, CAPILLARY
Glucose-Capillary: 102 mg/dL — ABNORMAL HIGH (ref 70–99)
Glucose-Capillary: 164 mg/dL — ABNORMAL HIGH (ref 70–99)

## 2010-11-07 ENCOUNTER — Inpatient Hospital Stay (HOSPITAL_COMMUNITY): Payer: Medicare Other

## 2010-11-07 LAB — GLUCOSE, CAPILLARY
Glucose-Capillary: 156 mg/dL — ABNORMAL HIGH (ref 70–99)
Glucose-Capillary: 92 mg/dL (ref 70–99)
Glucose-Capillary: 98 mg/dL (ref 70–99)

## 2010-11-07 LAB — TISSUE CULTURE: Culture: NO GROWTH

## 2010-11-14 NOTE — Op Note (Signed)
NAMEJACORIAN, Samuel [name] NO.:  [id number]  MEDICAL RECORD NO.:  [id number]           PATIENT TYPE:  I  LOCATION:  5019                         FACILITY:  [facility]  PHYSICIAN:  Toni Arthurs, MD        DATE OF BIRTH:  Mar 22, 1951  DATE OF PROCEDURE:  11/03/2010 DATE OF DISCHARGE:                              OPERATIVE REPORT   PREOPERATIVE DIAGNOSIS:  Left tibial osteomyelitis.  POSTOPERATIVE DIAGNOSIS:  Left tibial osteomyelitis.  PROCEDURE: 1. Left tibial sequestrectomy with the Synthes reamer irrigator     aspirator system. 2. Irrigation and debridement, left leg wound including skin,     subcutaneous tissue, muscle, and bone.  SURGEON:  Toni Arthurs, MD  FIRST ASSISTANT:  Janace Litten, OPA-C  ANESTHESIA:  General.  IV FLUIDS:  See anesthesia record.  ESTIMATED BLOOD LOSS:  Minimal.  TOURNIQUET TIME:  Zero.  SPECIMENS:  Reamer aspirate to microbiology for culture.  COMPLICATIONS:  None apparent.  DISPOSITION:  Extubated, awake, and stable to recovery.  INDICATIONS FOR PROCEDURE:  The patient is a 60 year old male with past medical history significant for diabetes.  He has a history of Charcot deformity of the left foot and is status post midfoot arthrodesis with a thin wire external fixator frame.  He presents now with drainage from one of his previous pin tracts.  An MRI of his leg showed osteomyelitis within the medullary canal adjacent to this pin tract.  He presents now for I&D of the wound as well as sequestrectomy of the osteomyelitis.  He understands the risks and benefits of this procedure including risks of bleeding, infection, nerve damage, blood clots, need for additional surgery, amputation, and death.  He would like to proceed.  PROCEDURE IN DETAIL:  After preoperative consent was obtained and the correct operative site was identified, the patient was brought to the operating room and placed supine on the operating table.   General anesthesia was induced.  Preoperative antibiotics were held until after cultures were obtained.  Surgical time-out was taken.  Left lower extremity was prepped and draped in standard sterile fashion with tourniquet around the thigh.  Longitudinal incision was marked over the center of the patellar tendon.  The incision was made.  Sharp dissection was carried down through the skin and subcutaneous tissue as well as the peritenon.  The patellar tendon was then split centrally in line with its fibers.  The guidewire for the reamer system was then positioned over the medullary canal.  It was advanced into the metaphysis bone of the proximal tibia.  AP and lateral fluoroscopic images were obtained showing appropriate position of the guide pin.  The entry reamer was used to open the proximal medullary canal.  The 12-mm Synthes reamer irrigator aspirator system was introduced over a ball-tip guidewire, which had been placed down through the medullary canal to the level of the distal tibial physeal scar.  The medullary canal was then reamed. After the first pass with the reamer, the contents of the aspirator basket were sent as a specimen to microbiology.  Canal was then reamed and irrigated and aspirated  with 3 liters of fluid.  The reamer irrigator aspirator system was then removed and the ball-tip guidewire was removed as well.  The wound at the leg was ellipsed out leaving healthy skin on either side.  Circumferential debridement was carried out from the level of the skin down to the level of the bone taking care to curette out the previous pin tract within the bone itself.  The wound was irrigated copiously.  Simple sutures of 2-0 nylon were used to approximate the skin edges.  Attention was then turned to the back table where a chest tube was used to create a cement nail for implantation into the tibial canal.  Two packs of high viscosity Palacos-G cement were mixed and a gram  of vancomycin was added to each pack for a total of 2 grams.  This mixture was then inserted into the chest tube.  The ball-tip guidewire was then inserted into the wet cement.  This was allowed to cure.  The chest tube was then cut off of the cement nail.  The nail was inserted into the medullary canal, and the proximal end of the nail was trimmed to allow the cement to be completely within the canal.  The ball-tip guidewire was cut and bent into a hook allowing for later retrieval if necessary. The knee wound was irrigated copiously.  The final AP and lateral x-rays of the length of the tibia showed appropriate position of the nail and the guidewire.  The patellar tendon was repaired with 0 Vicryl simple sutures.  The patellar retinaculum was closed with simple sutures of 0 Vicryl.  Subcutaneous tissue was closed with inverted simple sutures of 3-0 Monocryl.  The skin was closed with a running 3-0 Prolene.  Both wounds were dressed with sterile dressings and an Ace wrap.  The patient was awakened from anesthesia and transported to the recovery room in stable condition.  FOLLOWUP PLAN:  The patient will remain toe-touch weightbearing on his left lower extremity.  He will have physical therapy consultations.  We will have a PICC line inserted.  I have consulted with Dr. Orvan Falconer of Infectious Disease for guidance on appropriate treatment with IV antibiotics.     Toni Arthurs, MD     JH/MEDQ  D:  11/03/2010  T:  11/04/2010  Job:  [id number]  cc:   Massie Maroon, MD Cliffton Asters, M.D.  Electronically Signed by Toni Arthurs  on 11/14/2010 01:38:53 PM

## 2010-11-14 NOTE — Discharge Summary (Signed)
[name], [name] NO.:  [id number]  MEDICAL RECORD NO.:  [id number]           PATIENT TYPE:  I  LOCATION:  5019                         FACILITY:  [facility]  PHYSICIAN:  Toni Arthurs, MD        DATE OF BIRTH:  1950/08/06  DATE OF ADMISSION:  11/03/2010 DATE OF DISCHARGE:  11/08/2010                              DISCHARGE SUMMARY   ADMISSION DIAGNOSES: 1. Left tibial osteomyelitis. 2. Type 2 diabetes. 3. Diabetic gastroparesis. 4. Severe bilateral peripheral neuropathy. 5. Coronary artery disease status post stent placement, December 2010. 6. Hypertension. 7. Status post right hallux amputation. 8. Depression. 9. Gastroesophageal reflux disease. 10.Hyperlipidemia. 11.Sleep apnea.  DISCHARGE DIAGNOSES: 1. Left tibial osteomyelitis. 2. Type 2 diabetes. 3. Diabetic gastroparesis. 4. Severe bilateral peripheral neuropathy. 5. Coronary artery disease status post stent placement, December 2010. 6. Hypertension. 7. Status post right hallux amputation. 8. Depression. 9. Gastroesophageal reflux disease. 10.Hyperlipidemia. 11.Sleep apnea. 12.Status post incision and drainage of left leg osteomyelitis with     intramedullary reaming and antibiotic nail placement.  HISTORY OF PRESENT ILLNESS:  The patient is a 60 year old male with a complicated past medical history who was admitted with osteomyelitis of his left tibia.  He had Charcot joint a year ago and was treated with midfoot osteotomy with a thin wire external fixator frame.  His foot has healed, but he has had recurrent bouts of osteomyelitis of the left tibia.  An MRI recently showed a 5-cm segment of enhancement in the intramedullary space adjacent to one of the pin tracts.  This MRI finding was consistent with osteomyelitis.  He presents now for operative treatment of this condition.  HOSPITAL COURSE:  The patient was brought to the operating room upon admission to the hospital on Nov 03, 2010.  He  underwent medullary reaming, irrigation and aspiration of the tibia.  He also had I and D of his wound and the former pin tract site.  He had placement of an antibiotic rod in the tibia.  He tolerated this procedure well and was transferred back to the inpatient ward where he remained for the duration of his hospital stay.  Postoperatively, he had consultations from Infectious Disease and Physical Therapy.  He was started on Ancef 2 g IV q.8 h. after being transitioned off vancomycin.  His cultures never grew anything from the operating room.  The patient did well with physical therapy.  He had a PICC line placed.  He was discharged home in stable condition on Nov 08, 2010.  DISCHARGE MEDICATIONS:  See the medication reconciliation sheet.  DISCHARGE CONDITION:  Stable.  FOLLOWUP PLAN:  A followup with me in 2 weeks in clinic for suture removal.  DISCHARGE DIET:  Diabetic diet.  Infectious Disease followup.  The patient will have Ancef 2 g IV q.8 h. until December 15, 2010.  This will be done through home health.  He will follow up with Dr. Selena Batten, his primary care provider, as previously scheduled.  He will follow up with Dr. Orvan Falconer in Infectious Disease Clinic at [facility] after completion of his IV antibiotics.  Toni Arthurs, MD     JH/MEDQ  D:  11/07/2010  T:  11/08/2010  Job:  [id number]  cc:   Cliffton Asters, M.D. Massie Maroon, MD  Electronically Signed by Toni Arthurs  on 11/14/2010 01:38:56 PM

## 2010-12-01 LAB — FUNGUS CULTURE W SMEAR

## 2010-12-11 ENCOUNTER — Encounter: Payer: Self-pay | Admitting: Internal Medicine

## 2010-12-11 ENCOUNTER — Ambulatory Visit (INDEPENDENT_AMBULATORY_CARE_PROVIDER_SITE_OTHER): Payer: Medicare Other | Admitting: Internal Medicine

## 2010-12-11 VITALS — BP 124/78 | HR 94 | Temp 97.4°F | Ht 75.0 in | Wt 287.2 lb

## 2010-12-11 DIAGNOSIS — M86179 Other acute osteomyelitis, unspecified ankle and foot: Secondary | ICD-10-CM

## 2010-12-11 NOTE — Progress Notes (Signed)
Order to remove PICC line obtained from Dr. Orvan Falconer. Pt. identified with name and date of birth. PICC dressing removed, site unremarkable. PICC line removed using sterile procedure @ 1545 pm. PICC length equal to that noted in pt's hospital chart of 49 cm. Sterile petroleum gauze + sterile 4X4 applied to PICC site, pressure applied for 10 minutes and covered with Medipore tape as a pressure dressing. Pt. instructed to limit use of arm for 1 hour. Pt. instructed that the pressure dressing should remain in place for 24 hours. Pt. verablized understanding of these instructions.  Jennet Maduro, RN.

## 2010-12-11 NOTE — Progress Notes (Signed)
  Subjective:    Patient ID: Samuel [name], male    DOB: 1950/06/29, 60 y.o.   MRN: [id number]  HPI Samuel [name] is a 60 year old with diabetes mellitus and Charcot arthropathy who developed him MSSA osteomyelitis at the site of a tibial pin that was in from June until September of last year when he had an external fixator on after left midfoot fusion. He received a course of IV and oral antibiotics from July until October of last year but relapsed in January and received another course of IV Ancef until March. He took oral doxycycline but still had evidence of persistent tibial osteomyelitis. He was readmitted to the hospital in mid May and underwent a thorough debridement. Operative Gram stain and cultures were negative but he was treated again with IV Ancef. He is now completing 6 weeks of therapy. He has had no problems with his PICC. He does feel like the Ancef has made his chronic nausea from gastroparesis worse. The chronic draining sinus over his lower tibia has closed and he's had no further drainage and minimal pain.    Review of Systems     Objective:   Physical Exam  Constitutional: No distress.  Musculoskeletal:       Incision over his lower tibia has healed. He has no redness, swelling, warmth or other signs of active infection.  Skin:       His left arm PICC site appears normal.          Assessment & Plan:

## 2010-12-11 NOTE — Assessment & Plan Note (Addendum)
He is doing much better and I am hopeful that he is now cured of his osteomyelitis. However, I told him that the only real test of cure is to stop all antibiotics and wait long enough period of time to see if he relapses. I will check a sedimentation rate and C-reactive protein today and discuss the results with him before deciding if we stop all antibiotics today were stopped Ancef and switched to oral Keflex. Because of the problems with his nausea he would prefer to stop all antibiotics now if his lab work suggests that is okay.  Addendum: His C-reactive protein was down to the upper limit of normal at 0.6 and his sedimentation rate had normalized to 14 so I will observe him off of all antibiotics at this time.

## 2010-12-12 LAB — SEDIMENTATION RATE: Sed Rate: 14 mm/hr (ref 0–16)

## 2010-12-17 LAB — AFB CULTURE WITH SMEAR (NOT AT ARMC)

## 2011-03-13 ENCOUNTER — Encounter (HOSPITAL_COMMUNITY): Payer: Self-pay | Admitting: Radiology

## 2011-03-13 ENCOUNTER — Emergency Department (HOSPITAL_COMMUNITY): Payer: Medicare Other

## 2011-03-13 ENCOUNTER — Inpatient Hospital Stay (HOSPITAL_COMMUNITY)
Admission: EM | Admit: 2011-03-13 | Discharge: 2011-03-20 | DRG: 166 | Disposition: A | Discharge status: Home / Self Care | Payer: Medicare Other | Attending: Pulmonary Disease | Admitting: Pulmonary Disease

## 2011-03-13 DIAGNOSIS — E1142 Type 2 diabetes mellitus with diabetic polyneuropathy: Secondary | ICD-10-CM | POA: Diagnosis present

## 2011-03-13 DIAGNOSIS — F329 Major depressive disorder, single episode, unspecified: Secondary | ICD-10-CM | POA: Diagnosis present

## 2011-03-13 DIAGNOSIS — Z23 Encounter for immunization: Secondary | ICD-10-CM

## 2011-03-13 DIAGNOSIS — F3289 Other specified depressive episodes: Secondary | ICD-10-CM | POA: Diagnosis present

## 2011-03-13 DIAGNOSIS — R0902 Hypoxemia: Secondary | ICD-10-CM

## 2011-03-13 DIAGNOSIS — I10 Essential (primary) hypertension: Secondary | ICD-10-CM | POA: Diagnosis present

## 2011-03-13 DIAGNOSIS — K219 Gastro-esophageal reflux disease without esophagitis: Secondary | ICD-10-CM | POA: Diagnosis present

## 2011-03-13 DIAGNOSIS — I2510 Atherosclerotic heart disease of native coronary artery without angina pectoris: Secondary | ICD-10-CM | POA: Diagnosis present

## 2011-03-13 DIAGNOSIS — E1149 Type 2 diabetes mellitus with other diabetic neurological complication: Secondary | ICD-10-CM | POA: Diagnosis present

## 2011-03-13 DIAGNOSIS — E119 Type 2 diabetes mellitus without complications: Secondary | ICD-10-CM

## 2011-03-13 DIAGNOSIS — G4730 Sleep apnea, unspecified: Secondary | ICD-10-CM | POA: Diagnosis present

## 2011-03-13 DIAGNOSIS — Z794 Long term (current) use of insulin: Secondary | ICD-10-CM

## 2011-03-13 DIAGNOSIS — I824Y9 Acute embolism and thrombosis of unspecified deep veins of unspecified proximal lower extremity: Secondary | ICD-10-CM | POA: Diagnosis present

## 2011-03-13 DIAGNOSIS — K3184 Gastroparesis: Secondary | ICD-10-CM | POA: Diagnosis present

## 2011-03-13 DIAGNOSIS — J9600 Acute respiratory failure, unspecified whether with hypoxia or hypercapnia: Secondary | ICD-10-CM | POA: Diagnosis present

## 2011-03-13 DIAGNOSIS — Z79899 Other long term (current) drug therapy: Secondary | ICD-10-CM

## 2011-03-13 DIAGNOSIS — Z9861 Coronary angioplasty status: Secondary | ICD-10-CM

## 2011-03-13 DIAGNOSIS — I2699 Other pulmonary embolism without acute cor pulmonale: Secondary | ICD-10-CM

## 2011-03-13 DIAGNOSIS — S98119A Complete traumatic amputation of unspecified great toe, initial encounter: Secondary | ICD-10-CM

## 2011-03-13 DIAGNOSIS — K560 Paralytic ileus: Secondary | ICD-10-CM | POA: Diagnosis present

## 2011-03-13 DIAGNOSIS — E785 Hyperlipidemia, unspecified: Secondary | ICD-10-CM | POA: Diagnosis present

## 2011-03-13 HISTORY — DX: Gastroparesis: K31.84

## 2011-03-13 HISTORY — DX: Gastro-esophageal reflux disease without esophagitis: K21.9

## 2011-03-13 HISTORY — DX: Atherosclerotic heart disease of native coronary artery without angina pectoris: I25.10

## 2011-03-13 HISTORY — DX: Hyperlipidemia, unspecified: E78.5

## 2011-03-13 HISTORY — DX: Essential (primary) hypertension: I10

## 2011-03-13 HISTORY — DX: Polyneuropathy, unspecified: G62.9

## 2011-03-13 HISTORY — DX: Other specified diabetes mellitus with diabetic neuropathic arthropathy: E13.610

## 2011-03-13 LAB — POCT I-STAT 3, ART BLOOD GAS (G3+)
Patient temperature: 96.9
pH, Arterial: 7.377 (ref 7.350–7.450)

## 2011-03-13 LAB — CBC
Hemoglobin: 13.3 g/dL (ref 13.0–17.0)
MCH: 27.3 pg (ref 26.0–34.0)
MCV: 81.1 fL (ref 78.0–100.0)
RBC: 4.88 MIL/uL (ref 4.22–5.81)

## 2011-03-13 LAB — COMPREHENSIVE METABOLIC PANEL
BUN: 22 mg/dL (ref 6–23)
CO2: 26 mEq/L (ref 19–32)
Chloride: 98 mEq/L (ref 96–112)
Creatinine, Ser: 1.23 mg/dL (ref 0.50–1.35)
GFR calc Af Amer: >60 mL/min (ref >60)
GFR calc non Af Amer: >60 mL/min (ref >60)
Total Bilirubin: 0.4 mg/dL (ref 0.3–1.2)

## 2011-03-13 LAB — DIFFERENTIAL
Eosinophils Absolute: 0.1 10*3/uL (ref 0.0–0.7)
Lymphs Abs: 1 10*3/uL (ref 0.7–4.0)
Monocytes Absolute: 0.7 10*3/uL (ref 0.1–1.0)
Monocytes Relative: 7 % (ref 3–12)
Neutro Abs: 7 10*3/uL (ref 1.7–7.7)
Neutrophils Relative %: 80 % — ABNORMAL HIGH (ref 43–77)

## 2011-03-13 LAB — PRO B NATRIURETIC PEPTIDE: Pro B Natriuretic peptide (BNP): 954.6 pg/mL — ABNORMAL HIGH (ref 0–125)

## 2011-03-13 LAB — PROTIME-INR: Prothrombin Time: 13.6 seconds (ref 11.6–15.2)

## 2011-03-13 MED ORDER — IOHEXOL 350 MG/ML SOLN
100.0000 mL | Freq: Once | INTRAVENOUS | Status: AC | PRN
  Administered 2011-03-13: 100 mL via INTRAVENOUS

## 2011-03-14 ENCOUNTER — Inpatient Hospital Stay (HOSPITAL_COMMUNITY): Payer: Medicare Other

## 2011-03-14 DIAGNOSIS — I2699 Other pulmonary embolism without acute cor pulmonale: Secondary | ICD-10-CM

## 2011-03-14 DIAGNOSIS — R0902 Hypoxemia: Secondary | ICD-10-CM

## 2011-03-14 DIAGNOSIS — R0602 Shortness of breath: Secondary | ICD-10-CM

## 2011-03-14 DIAGNOSIS — E119 Type 2 diabetes mellitus without complications: Secondary | ICD-10-CM

## 2011-03-14 DIAGNOSIS — J9600 Acute respiratory failure, unspecified whether with hypoxia or hypercapnia: Secondary | ICD-10-CM

## 2011-03-14 LAB — GLUCOSE, CAPILLARY
Glucose-Capillary: 249 mg/dL — ABNORMAL HIGH (ref 70–99)
Glucose-Capillary: 208 mg/dL — ABNORMAL HIGH (ref 70–99)
Glucose-Capillary: 183 mg/dL — ABNORMAL HIGH (ref 70–99)
Glucose-Capillary: 207 mg/dL — ABNORMAL HIGH (ref 70–99)

## 2011-03-14 LAB — CBC
HCT: 37.5 % — ABNORMAL LOW (ref 39.0–52.0)
MCH: 26.9 pg (ref 26.0–34.0)
MCV: 81.9 fL (ref 78.0–100.0)
Platelets: 194 10*3/uL (ref 150–400)
RDW: 16.6 % — ABNORMAL HIGH (ref 11.5–15.5)
WBC: 8.6 10*3/uL (ref 4.0–10.5)

## 2011-03-14 LAB — POCT I-STAT TROPONIN I: Troponin i, poc: 0.12 ng/mL (ref 0.00–0.08)

## 2011-03-14 LAB — HEPARIN LEVEL (UNFRACTIONATED): Heparin Unfractionated: 0.39 IU/mL (ref 0.30–0.70)

## 2011-03-14 LAB — MRSA PCR SCREENING: MRSA by PCR: NEGATIVE

## 2011-03-14 MED ORDER — IOHEXOL 300 MG/ML  SOLN
100.0000 mL | Freq: Once | INTRAMUSCULAR | Status: AC | PRN
  Administered 2011-03-14: 45 mL via INTRAVENOUS

## 2011-03-15 ENCOUNTER — Inpatient Hospital Stay (HOSPITAL_COMMUNITY): Payer: Medicare Other

## 2011-03-15 LAB — CBC
HCT: 36.4 % — ABNORMAL LOW (ref 39.0–52.0)
Hemoglobin: 11.6 g/dL — ABNORMAL LOW (ref 13.0–17.0)
MCHC: 31.9 g/dL (ref 30.0–36.0)
MCV: 82.2 fL (ref 78.0–100.0)
WBC: 7.2 10*3/uL (ref 4.0–10.5)

## 2011-03-15 LAB — BASIC METABOLIC PANEL
CO2: 28 mEq/L (ref 19–32)
Chloride: 102 mEq/L (ref 96–112)
GFR calc Af Amer: 52 mL/min — ABNORMAL LOW (ref >60)
Potassium: 4 mEq/L (ref 3.5–5.1)

## 2011-03-15 LAB — PROTIME-INR
INR: 1.1 (ref 0.00–1.49)
Prothrombin Time: 14.4 seconds (ref 11.6–15.2)

## 2011-03-15 LAB — GLUCOSE, CAPILLARY: Glucose-Capillary: 211 mg/dL — ABNORMAL HIGH (ref 70–99)

## 2011-03-16 LAB — CBC
HCT: 35.5 % — ABNORMAL LOW (ref 39.0–52.0)
Hemoglobin: 11.6 g/dL — ABNORMAL LOW (ref 13.0–17.0)
MCV: 82.4 fL (ref 78.0–100.0)
Platelets: 177 10*3/uL (ref 150–400)
RBC: 4.31 MIL/uL (ref 4.22–5.81)
WBC: 6.2 10*3/uL (ref 4.0–10.5)

## 2011-03-16 LAB — GLUCOSE, CAPILLARY
Glucose-Capillary: 233 mg/dL — ABNORMAL HIGH (ref 70–99)
Glucose-Capillary: 148 mg/dL — ABNORMAL HIGH (ref 70–99)

## 2011-03-16 LAB — BASIC METABOLIC PANEL
CO2: 28 mEq/L (ref 19–32)
Chloride: 101 mEq/L (ref 96–112)
Glucose, Bld: 186 mg/dL — ABNORMAL HIGH (ref 70–99)
Potassium: 3.9 mEq/L (ref 3.5–5.1)
Sodium: 138 mEq/L (ref 135–145)

## 2011-03-17 LAB — CULTURE, BLOOD (ROUTINE X 2): Culture  Setup Time: 201209260039

## 2011-03-17 LAB — GLUCOSE, CAPILLARY
Glucose-Capillary: 125 mg/dL — ABNORMAL HIGH (ref 70–99)
Glucose-Capillary: 171 mg/dL — ABNORMAL HIGH (ref 70–99)

## 2011-03-18 DIAGNOSIS — R0902 Hypoxemia: Secondary | ICD-10-CM

## 2011-03-18 DIAGNOSIS — J9600 Acute respiratory failure, unspecified whether with hypoxia or hypercapnia: Secondary | ICD-10-CM

## 2011-03-18 DIAGNOSIS — I2699 Other pulmonary embolism without acute cor pulmonale: Secondary | ICD-10-CM

## 2011-03-18 LAB — PROTIME-INR
INR: 1.32 (ref 0.00–1.49)
Prothrombin Time: 16.6 seconds — ABNORMAL HIGH (ref 11.6–15.2)

## 2011-03-18 LAB — GLUCOSE, CAPILLARY

## 2011-03-19 DIAGNOSIS — E119 Type 2 diabetes mellitus without complications: Secondary | ICD-10-CM

## 2011-03-19 DIAGNOSIS — R0902 Hypoxemia: Secondary | ICD-10-CM

## 2011-03-19 DIAGNOSIS — J9600 Acute respiratory failure, unspecified whether with hypoxia or hypercapnia: Secondary | ICD-10-CM

## 2011-03-19 DIAGNOSIS — I2699 Other pulmonary embolism without acute cor pulmonale: Secondary | ICD-10-CM

## 2011-03-19 LAB — GLUCOSE, CAPILLARY
Glucose-Capillary: 188 mg/dL — ABNORMAL HIGH (ref 70–99)
Glucose-Capillary: 153 mg/dL — ABNORMAL HIGH (ref 70–99)

## 2011-03-19 LAB — PROTIME-INR: Prothrombin Time: 21.5 seconds — ABNORMAL HIGH (ref 11.6–15.2)

## 2011-03-20 ENCOUNTER — Encounter: Payer: Self-pay | Admitting: Internal Medicine

## 2011-03-20 DIAGNOSIS — I2699 Other pulmonary embolism without acute cor pulmonale: Secondary | ICD-10-CM

## 2011-03-20 DIAGNOSIS — R0902 Hypoxemia: Secondary | ICD-10-CM

## 2011-03-20 DIAGNOSIS — J9600 Acute respiratory failure, unspecified whether with hypoxia or hypercapnia: Secondary | ICD-10-CM

## 2011-03-20 DIAGNOSIS — E119 Type 2 diabetes mellitus without complications: Secondary | ICD-10-CM

## 2011-03-20 LAB — CBC
Hemoglobin: 11.3 g/dL — ABNORMAL LOW (ref 13.0–17.0)
MCH: 26.4 pg (ref 26.0–34.0)
MCV: 81.5 fL (ref 78.0–100.0)
Platelets: 182 10*3/uL (ref 150–400)
RBC: 4.28 MIL/uL (ref 4.22–5.81)

## 2011-03-21 ENCOUNTER — Telehealth: Payer: Self-pay | Admitting: Emergency Medicine

## 2011-03-21 MED ORDER — WARFARIN SODIUM 5 MG PO TABS: ORAL_TABLET | ORAL | Status: DC

## 2011-03-21 NOTE — Telephone Encounter (Signed)
Pt states was d/c'd from hospital on yesterday, was started on Coumadin by our hospitalist and did not get enough pills to last to OV on Monday with RB. Pt is scheduled to have labs drawn by his PCP Dr. [name] to check PT/INR  tomorrow. Pt was very scared, frustrated, and confused with this process with it being his first time ever having to use Coumadin. I apoligized for any inconvienence and explained to pt what to expect being a new start on Coumadin, pt then calmed down and was receptive. I advised him to ask Dr. Selena Batten since he is checking his PT/INR to prescribe any additional refills according to the adjustments and to fax any correspondence to RB as well. Pt will be seen in the office for the first time on Monday. I did send in a refill to [facility] with #60x0 refills.

## 2011-03-23 NOTE — Telephone Encounter (Signed)
Spoke with Dr. Elmyra Ricks nurse who advised pt was seen yesterday and will be followed for his coumadin by Dr. Virgina Evener. Nothing else needed.

## 2011-03-27 ENCOUNTER — Telehealth: Payer: Self-pay | Admitting: Emergency Medicine

## 2011-03-27 ENCOUNTER — Ambulatory Visit (INDEPENDENT_AMBULATORY_CARE_PROVIDER_SITE_OTHER): Payer: Medicare Other | Admitting: Emergency Medicine

## 2011-03-27 ENCOUNTER — Encounter: Payer: Self-pay | Admitting: Emergency Medicine

## 2011-03-27 VITALS — BP 126/80 | HR 83 | Temp 97.7°F | Ht 75.0 in | Wt 290.4 lb

## 2011-03-27 DIAGNOSIS — I2699 Other pulmonary embolism without acute cor pulmonale: Secondary | ICD-10-CM

## 2011-03-27 NOTE — Progress Notes (Signed)
Subjective:    Patient ID: Samuel [name], male    DOB: March 04, 1951, 60 y.o.   MRN: [id number]  HPI 60 yo man, hx obesity, DM, Charcot foot with hx MSSA, OSA (not on CPAP). Admitted 9-03/2011 with acute PE, started on anticoagulation. Getting his INR checked at [facility] (Dr Phillips Climes). Last INR was 5.5, coumadin adjusted, to f/u on Friday. He has an IVC filter in place, retrievable. Has noticed no bleeding. No syncope. No CP. Did not get d/c on O2. He has 1 more week of keflex for GPC on blood cx's.    Review of Systems  Constitutional: Positive for activity change.  HENT: Positive for dental problem.   Respiratory: Positive for shortness of breath.   Cardiovascular: Positive for leg swelling.  Gastrointestinal: Positive for abdominal pain.  Psychiatric/Behavioral: The patient is nervous/anxious.     Past Medical History  Diagnosis Date  . Diabetes mellitus   . Hypertension   . CAD (coronary artery disease)   . 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 Gastroparesis   . 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 GERD (gastroesophageal reflux disease)   . Hyperlipidemia   . Peripheral neuropathy      Family History  Problem Relation Age of Onset  . COPD Mother   . Heart disease Mother   . Lung cancer Mother      History   Social History  . Marital Status: Divorced    Spouse Name: N/A    Number of Children: N/A  . Years of Education: N/A   Occupational History  . DISABLITY    Social History Main Topics  . Smoking status: Never Smoker   . Smokeless tobacco: Never Used  . Alcohol Use: No  . Drug Use: No  . Sexually Active: Not on file   Other Topics Concern  . Not on file   Social History Narrative  . No narrative on file     Allergies  Allergen Reactions  . Codeine     REACTION: GI Upset  . Gabapentin     REACTION: rash/hives  . Nabumetone     REACTION: sick on stomach and rash     Outpatient Prescriptions Prior to Visit    Medication Sig Dispense Refill  . acetaminophen ([employer] ARTHRITIS PAIN RELIEF) 650 MG CR tablet Take 650 mg by mouth every 8 (eight) hours as needed.        . B Complex-C (B-50 COMPLEX/VITAMIN C PO) Take 1 tablet by mouth 2 (two) times daily.        . diphenhydramine-acetaminophen (EQ PAIN RELIEVER PM) 25-500 MG TABS Take 2 tablets by mouth at bedtime.        . Eszopiclone (ESZOPICLONE) 3 MG TABS Take 3 mg by mouth at bedtime as needed.       . insulin detemir (LEVEMIR) 100 UNIT/ML injection Inject into the skin as directed.        . lansoprazole (PREVACID) 30 MG capsule Take 30 mg by mouth 2 (two) times daily.        . metoCLOPramide (REGLAN) 5 MG tablet Take 5 mg by mouth 2 (two) times daily.        . ondansetron (ZOFRAN) 4 MG tablet Take 4 mg by mouth every 12 (twelve) hours as needed.        . pioglitazone (ACTOS) 30 MG tablet Take 30 mg by mouth daily.        . pregabalin (LYRICA) 300  MG capsule Take 300 mg by mouth 3 (three) times daily.        . promethazine (PHENERGAN) 25 MG tablet Take 25 mg by mouth 2 (two) times daily.        . quinapril (ACCUPRIL) 5 MG tablet Take 5 mg by mouth daily.        . simvastatin (ZOCOR) 20 MG tablet Take 20 mg by mouth at bedtime.        . sitaGLIPtan-metformin (JANUMET) 50-1000 MG per tablet Take 1 tablet by mouth 2 (two) times daily with a meal.        . warfarin (COUMADIN) 5 MG tablet Take 2 tabs by mouth daily or as directed.  60 tablet  0  . L-Methylfolate-B6-B12 (METANX) 2.8-25-2 MG TABS Take 1 tablet by mouth 2 (two) times daily.        . Methylfol-Methylcob-Acetylcyst 6-2-600 MG TABS Take by mouth at bedtime.              Objective:   Physical Exam  Gen: Pleasant, obese man, in no distress,  normal affect  ENT: No lesions,  mouth clear,  oropharynx clear, no postnasal drip  Neck: No JVD, no TMG, no carotid bruits  Lungs: No use of accessory muscles, no dullness to percussion, clear without rales or rhonchi  Cardiovascular: RRR, heart  sounds normal, no murmur or gallops, no peripheral edema  Musculoskeletal: No deformities, L LE with dressing, CDI  Neuro: alert, non focal  Skin: Warm, no lesions or rashes      Assessment & Plan:  Pulmonary embolism PE dx 9/12, on coumadin, IVCF placed.  - will ask IR whether they are willing to d/c the IVC filter while on coumadin or whether we need to bridge w enoxaparin - coumadin for at least 6 months - hyper coag panel once off coumadin - will repeat TTE to assess Pulm pressures in several months.

## 2011-03-27 NOTE — Patient Instructions (Signed)
Continue your coumadin We will discuss removal of your IVC filter with Interventional Radiology. We may need to temporarily stop coumadin to have this done We will repeat your echocardiogram at a future date We will do blood work several months from now when you are off coumadin Follow with Dr Delton Coombes in 3 months

## 2011-03-27 NOTE — Assessment & Plan Note (Signed)
PE dx 9/12, on coumadin, IVCF placed.  - will ask IR whether they are willing to d/c the IVC filter while on coumadin or whether we need to bridge w enoxaparin - coumadin for at least 6 months - hyper coag panel once off coumadin - will repeat TTE to assess Pulm pressures in several months.

## 2011-03-28 NOTE — Telephone Encounter (Signed)
[facility] has been having problems with their phone system. I have left a msg for Harriett Sine to return my call regarding this patient to discuss bridging the pt on Lovenox . Pt needs to be off of his coumadin 5 days prior to removal of I"VC filter. Pt is scheduled to be seen for a coumadin check on Fri., 10/12. Will await callback from [place].

## 2011-03-28 NOTE — Telephone Encounter (Signed)
I spoke with Samuel [name] @ Dr. Rosalio Macadamia office and they will inc\struct the pt on Fri., 10/12 on Lovenox.

## 2011-04-02 ENCOUNTER — Telehealth: Payer: Self-pay | Admitting: Emergency Medicine

## 2011-04-02 LAB — I-STAT 8, (EC8 V) (CONVERTED LAB)
Bicarbonate: 28.8 — ABNORMAL HIGH
Glucose, Bld: 138 — ABNORMAL HIGH
HCT: 50
TCO2: 31
pCO2, Ven: 59.1 — ABNORMAL HIGH
pH, Ven: 7.296

## 2011-04-02 NOTE — Telephone Encounter (Signed)
Per Lawson Fiscal, rhonda was taking care of this. I spoke with rhonda and she stated to forward to her and she will see what the status of the apt is. Please advise, rhonda, thanks  Carver Fila, CMA

## 2011-04-02 NOTE — Telephone Encounter (Signed)
LMOM for Misty Stanley to fax over pt's Pt/ONR results from Fri., 10/12 so we can fax them to [facility] per rhonda. Asked that results be faxed to triage at [phone].

## 2011-04-02 NOTE — Telephone Encounter (Signed)
LMOMTCB for pt  

## 2011-04-02 NOTE — Telephone Encounter (Signed)
Spoke with Samuel [name] at [employer] she stated that she was waiting on the ok from [place] to schedule. Pt was to see [name] at [facility] on Friday 03/30/11 to see about changing over from coumadin to enoxaparin. Lawson Fiscal will contact [facility] and check to see if pt changed over from coumadin to enoxaparin. Once this has been determined, then we can proceed with IVC filter removal appointment.

## 2011-04-03 ENCOUNTER — Other Ambulatory Visit: Payer: Self-pay | Admitting: Emergency Medicine

## 2011-04-03 DIAGNOSIS — I2699 Other pulmonary embolism without acute cor pulmonale: Secondary | ICD-10-CM

## 2011-04-03 NOTE — Telephone Encounter (Signed)
Called, spoke with pt.  States he was seen at [facility] on Friday and spoke with Dr. [name] early this morning regarding the Lovenox.  Per pt, Dr. Selena Batten informed him this morning he is trying to get the Lovenox thru the drug co for pt because the pt can't afford it.  I informed pt once this has been determined, then we can proceed with the appt.  He will call back when he hears something regarding the lovenox or with any new updates on this.  Spoke with Lawson Fiscal regarding this -- will forward message to her and Bjorn Loser.

## 2011-04-03 NOTE — Telephone Encounter (Signed)
PT/INR results received and given to Bjorn Loser so she may call to get procedure scheduled for the patient.

## 2011-04-04 NOTE — Telephone Encounter (Signed)
Called and spoke with patient today and he stated that he is taking his paperwork for assistance for the enoxaparin injections up to [facility] today. Advised patient that until he started or knew his start date with the enoxaparin injections, that we were going to be unable to schedule the removal of the IVC filter. Pt stated that he understood and that he would call us once he started on these injections.

## 2011-04-05 ENCOUNTER — Telehealth: Payer: Self-pay | Admitting: Emergency Medicine

## 2011-04-06 NOTE — Telephone Encounter (Signed)
Pt returning call can be reached at [phone].Samuel [name]

## 2011-04-06 NOTE — Telephone Encounter (Signed)
Called to check on status of injections. Patient is not at home. Will call back later this afternoon.

## 2011-04-06 NOTE — Telephone Encounter (Signed)
Returned pt's call and he stated that he has the paperwork for the enoxaparin injections to obtain assistance. Patient stated that he is planning on completing this paperwork and obtaining the necessary documentation and [facility] will fax this in on Monday 04/09/11. Explained to Mr. Henk to call me at [phone] once he has a start date for these injections. Once we have a start date, we can arrange the removal of the IVC filter.

## 2011-04-06 NOTE — Telephone Encounter (Signed)
Got paperwork from [place] and will hold until Lawson Fiscal comes back to the office on Monday

## 2011-04-06 NOTE — Telephone Encounter (Signed)
LMOAM for Samuel [name] at IR at [facility] to return call about patient. [phone])

## 2011-04-10 NOTE — Telephone Encounter (Signed)
Called to check status of enoxaparin injections with Arlys Fraser Brame's office. No answer. LMOAM for pt to return my call. Rhonda J Cobb

## 2011-04-12 NOTE — Telephone Encounter (Signed)
Lori please advise. Thanks

## 2011-04-12 NOTE — Telephone Encounter (Signed)
Sandy returned my call and she has been notified that this patient may have anywhere from 2 to 10 office visits and there is no way to calculate what the patient will have to pay after insurance. She verbalized understanding and asked that this be written on the form and faxed back to her. Form did not need Dr. Kavin Leech signature, so I have filled out this information, faxed the form and placed in RB scan folder.

## 2011-04-12 NOTE — Telephone Encounter (Signed)
Spoke with patient today, checking on status of enoxaparin. Paperwork has been completed and faxed to company. All needed records have been received. Patient is just waiting on approval and shipment of medication. Instructed patient to call me at [phone] when he receives medication.

## 2011-04-12 NOTE — Telephone Encounter (Signed)
After looking at the form, they are requesting to know the number of anticipated office visits fir the next 12 months and patient cost per visit after Medicare and/or other insurance has paid. I have left a msg for [facility] [phone]) to return my call regarding this.

## 2011-04-13 NOTE — Discharge Summary (Signed)
NAMESLY, PARLEE NO.:  [id number]  MEDICAL RECORD NO.:  [id number]  LOCATION:  5523                         FACILITY:  [facility]  PHYSICIAN:  Coralyn Helling, MD        DATE OF BIRTH:  02-07-1951  DATE OF ADMISSION:  03/13/2011 DATE OF DISCHARGE:  03/20/2011                              DISCHARGE SUMMARY   DISCHARGE DIAGNOSES: 1. Extensive pulmonary embolism. 2. Deep vein thrombosis. 3. Hypertension. 4. Gastrointestinal obstruction. 5. Diabetes mellitus. 6. Bacteremia.  HISTORY OF PRESENT ILLNESS:  Mr. Binette is a 60 year old white male with a history of morbid obesity, diabetes mellitus, coronary artery disease presented to [facility] on March 13, 2011, after passing out at home.  He was found to have a large bilateral pulmonary embolism and hypoxic respiratory failure.  [facility] requested Pulmonary Critical Care to manage and take over his care.  His blood cultures showed 1 or 2 with MRSA, believed to be a contaminant.  He was placed on initial IV ceftriaxone and azithromycin, and changed to Rocephin.  He was on a heparin drip and transitioned to Lovenox and then to Coumadin.  STUDIES:  He had a CT of the chest, showed extensive bilateral pulmonary embolism.  This was associated with mild enlargement of the pulmonary artery and possible mild enlargement of the right ventricle.  On March 14, 2011, bilateral lower extremities demonstrated right lower extremity DVT posttibial through popliteal partially mobile.  He also had a 2-D echo done which demonstrated left ventricle cavity size normal, EF 55-60%.  There was grade 1 diastolic dysfunction.  Aortic valve was poorly visualized.  There was trivial pericardial effusion.  FURTHER LABORATORY DATA:  Hemoglobin is 11.3, hematocrit 34.4, platelets were 182, WBC is 5.9.  His last INR on March 20, 2011, was 2.36 after 4 doses of Coumadin, last being 12 mg.  Sodium 138,  potassium 3.9, chloride 101, CO2 is 28, glucose 189, BUN 21, creatinine 1.26, calcium is 9.2.  HOSPITAL COURSE BY DISCHARGE DIAGNOSES: 1. Bilateral extensive pulmonary emboli.  He was admitted to [facility] and was treated with IV heparin and was transitioned     to low-molecular-weight Lovenox.  He reached therapeutic INR by     March 20, 2011, and continued with one further dose of Lovenox to     be discharged home, to follow up with Dr. Selena Batten in approximately 2     days for further evaluation.  He will be discharged on Coumadin 10     mg daily with two 5 mg tablets.  He is to follow up with PharmD at     Dr. Elmyra Ricks office for further evaluation and streamlining of his     Coumadin.  Of note, he does have an IVC filter in place.  He has a     history of left osteomyelitis.  He had GPC in 1 blood culture that     was considered a contaminant.  Therefore, he was just placed on     Keflex until complete. 2. Diabetes mellitus.  He is being receiving his home pharmaceutical  regimen and follow up with Dr. Selena Batten for fine-tuning. 3. Neuropathy.  He will continue on his home medication. 4. Coronary artery disease.  Continue on his home medication. 5. Hypertension.  Continue on his home medications.  He has reached maximal hospital benefit at [facility] and is being discharged home in improved condition on today's date.  DISCHARGE MEDICATIONS:  Note that he was given a user friendly medication sheet and told to take only the medications that are on the sheet. 1. Coumadin 5 mg tablet, 2 tablets daily. 2. Cephalexin 500 mg by mouth b.i.d. until gone for 7 days. 3. Actos 30 mg daily. 4. Docusate 100 mg daily. 5. Effient 10 mg every morning. 6. Placed on multivitamins daily. 7. Humalog insulin normal sliding scale. 8. Iron 65 mg every evening. 9. Janumet 1 tablet by mouth twice daily. 10.Levemir insulin 47 units subcutaneously twice daily. 11.Lyrica 300 mg twice  daily. 12.Reglan 1 tablet 10 mg by mouth four times a day. 13.Zofran 4 mg a day as needed every 6 hours. 14.Prevacid 30 mg twice daily. 15.Phenergan 25 mg daily at bedtime. 16.Quinapril 5 mg every morning. 17.Simvastatin 1 tablet every morning. 18.Tylenol PM 2 tablets at bedtime. 19.Vitamin B complex by mouth daily. 20.He is to stop taking his metoprolol.  DIET:  Heart-healthy, low-carbohydrate, diabetic diet.  DISPOSITION/CONDITION ON DISCHARGE:  Improved.  NOTE:  This was a complex discharge summary taking greater than 90 minutes.     Devra Dopp, MSN, ACNP   ______________________________ Coralyn Helling, MD    SM/MEDQ  D:  03/20/2011  T:  03/20/2011  Job:  [id number]  cc:   Massie Maroon, MD  Electronically Signed by Devra Dopp MSN ACNP on 04/11/2011 05:43:33 AM Electronically Signed by Coralyn Helling MD on 04/13/2011 12:10:11 AM

## 2011-04-19 NOTE — Telephone Encounter (Signed)
Patient called and stated that he has heard from [employer] today and has been approved for medication. They are faxing this information to Ridgebury Brame's office and once this paperwork has been completed and faxed back, medication will be shipped. Advised patient to contact me back once he has a ship date for this medication. Patient stated that he should receive this medication by next week, hopefully. Called and spoke with Victorino Dike at [facility] and she is aware.

## 2011-04-25 NOTE — Telephone Encounter (Signed)
LMOMTCB x 1. Called to see if Lovenox has been received.

## 2011-04-26 NOTE — Telephone Encounter (Signed)
LMOM for pt TCB 

## 2011-04-27 NOTE — Telephone Encounter (Signed)
lmomtcb  

## 2011-04-30 NOTE — Telephone Encounter (Signed)
LMTCB

## 2011-05-01 NOTE — Telephone Encounter (Signed)
LM w/ family member for pt to call back 

## 2011-05-01 NOTE — Telephone Encounter (Signed)
I spoke with pt and he states he has not received his Lovenox yet. Pt states they are suppose to be shipping it out to him this week.

## 2011-05-01 NOTE — Telephone Encounter (Signed)
Form for verification of medical expenses refaxed to [facility] @ [phone]. Pt aware.

## 2011-05-01 NOTE — Telephone Encounter (Signed)
Will forward to Lori per her request.  

## 2011-05-01 NOTE — Telephone Encounter (Signed)
Pt stated he dropped off paperwork for [facility] to fill out.  Pt was checking on the status & asked to be called at [phone].  Antionette Fairy

## 2011-05-01 NOTE — Telephone Encounter (Signed)
PT RETURNED CALL. CALL [phone]. Samuel [name]

## 2011-05-14 ENCOUNTER — Other Ambulatory Visit: Payer: Self-pay | Admitting: Emergency Medicine

## 2011-05-14 DIAGNOSIS — I2699 Other pulmonary embolism without acute cor pulmonale: Secondary | ICD-10-CM

## 2011-05-15 ENCOUNTER — Telehealth: Payer: Self-pay | Admitting: Emergency Medicine

## 2011-05-15 ENCOUNTER — Telehealth: Payer: Self-pay | Admitting: *Deleted

## 2011-05-15 ENCOUNTER — Other Ambulatory Visit: Payer: Self-pay | Admitting: Emergency Medicine

## 2011-05-15 DIAGNOSIS — I2699 Other pulmonary embolism without acute cor pulmonale: Secondary | ICD-10-CM

## 2011-05-15 NOTE — Telephone Encounter (Signed)
Message copied by Gwynneth Albright on Tue May 15, 2011  8:39 AM ------      Message from: [place], [place] J      Created: Mon May 14, 2011  4:43 PM       Have you contacted and advised Mr. Wengert to stop coumandin and to start on lovenox injections. He has to be off coumandin for 4 days.      Please advise.      Thanks,      Bjorn Loser

## 2011-05-15 NOTE — Telephone Encounter (Signed)
Pt is aware that he will need to stop his Coumadin after Thurs., 11/29 and then begin the Lovenox injections on Fri., 11/30. IVC filter removal is going to be  scheduled for Tues., 12/4 per Alfonso Ramus. Pt has instructions on how to dose the Lovenox from Dr. Virgina Evener @ the [facility]. [name] He will then start back on Coumadin as instructed from Dr. Virgina Evener.

## 2011-05-15 NOTE — Telephone Encounter (Signed)
ATC patient - NA and no option to LM.  WCB.

## 2011-05-16 NOTE — Telephone Encounter (Signed)
LMOMTCB x 1 

## 2011-05-17 NOTE — Telephone Encounter (Signed)
ATC NA unable to leave vm wcb 

## 2011-05-17 NOTE — Telephone Encounter (Signed)
Pt returned call and wants to know what his chances of getting another blood clot would be. He is aware that he will probably be on Coumadin therapy for at least 6 months and signs he can watch for would be leg pain, soreness or discomfort in his legs, shortness of breath, redness or warmth and swelling. Pt verbalized understanding.

## 2011-05-21 ENCOUNTER — Other Ambulatory Visit (HOSPITAL_COMMUNITY): Payer: Self-pay | Admitting: Radiology

## 2011-05-22 ENCOUNTER — Ambulatory Visit (HOSPITAL_COMMUNITY)
Admission: RE | Admit: 2011-05-22 | Discharge: 2011-05-22 | Disposition: A | Discharge status: Home / Self Care | Payer: Medicare Other | Source: Ambulatory Visit | Attending: Emergency Medicine | Admitting: Emergency Medicine

## 2011-05-22 ENCOUNTER — Encounter: Payer: Self-pay | Admitting: Physician Assistant

## 2011-05-22 DIAGNOSIS — E1142 Type 2 diabetes mellitus with diabetic polyneuropathy: Secondary | ICD-10-CM | POA: Insufficient documentation

## 2011-05-22 DIAGNOSIS — Z86718 Personal history of other venous thrombosis and embolism: Secondary | ICD-10-CM | POA: Insufficient documentation

## 2011-05-22 DIAGNOSIS — E1149 Type 2 diabetes mellitus with other diabetic neurological complication: Secondary | ICD-10-CM | POA: Insufficient documentation

## 2011-05-22 DIAGNOSIS — I2699 Other pulmonary embolism without acute cor pulmonale: Secondary | ICD-10-CM

## 2011-05-22 DIAGNOSIS — Z4689 Encounter for fitting and adjustment of other specified devices: Secondary | ICD-10-CM | POA: Insufficient documentation

## 2011-05-22 DIAGNOSIS — E785 Hyperlipidemia, unspecified: Secondary | ICD-10-CM | POA: Insufficient documentation

## 2011-05-22 DIAGNOSIS — K219 Gastro-esophageal reflux disease without esophagitis: Secondary | ICD-10-CM | POA: Insufficient documentation

## 2011-05-22 DIAGNOSIS — K3184 Gastroparesis: Secondary | ICD-10-CM | POA: Insufficient documentation

## 2011-05-22 DIAGNOSIS — I10 Essential (primary) hypertension: Secondary | ICD-10-CM | POA: Insufficient documentation

## 2011-05-22 DIAGNOSIS — I2510 Atherosclerotic heart disease of native coronary artery without angina pectoris: Secondary | ICD-10-CM | POA: Insufficient documentation

## 2011-05-22 LAB — CBC
Hemoglobin: 13.9 g/dL (ref 13.0–17.0)
MCHC: 33.9 g/dL (ref 30.0–36.0)
Platelets: 172 10*3/uL (ref 150–400)
RBC: 4.87 MIL/uL (ref 4.22–5.81)

## 2011-05-22 LAB — BASIC METABOLIC PANEL
Calcium: 9.1 mg/dL (ref 8.4–10.5)
GFR calc Af Amer: 85 mL/min — ABNORMAL LOW (ref >90)
GFR calc non Af Amer: 73 mL/min — ABNORMAL LOW (ref >90)
Potassium: 4.4 mEq/L (ref 3.5–5.1)
Sodium: 138 mEq/L (ref 135–145)

## 2011-05-22 LAB — PROTIME-INR
INR: 1.06 (ref 0.00–1.49)
Prothrombin Time: 14 seconds (ref 11.6–15.2)

## 2011-05-22 MED ORDER — SODIUM CHLORIDE 0.9 % IV SOLN: Freq: Once | INTRAVENOUS | Status: DC

## 2011-05-22 MED ORDER — FENTANYL CITRATE 0.05 MG/ML IJ SOLN
INTRAMUSCULAR | Status: AC
  Filled 2011-05-22: qty 4

## 2011-05-22 MED ORDER — SODIUM CHLORIDE 0.9 % IV SOLN
Freq: Once | INTRAVENOUS | Status: AC
  Administered 2011-05-22: 11:00:00 via INTRAVENOUS

## 2011-05-22 MED ORDER — IOHEXOL 300 MG/ML  SOLN
150.0000 mL | Freq: Once | INTRAMUSCULAR | Status: AC | PRN
  Administered 2011-05-22: 70 mL via INTRAVENOUS

## 2011-05-22 MED ORDER — MIDAZOLAM HCL 2 MG/2ML IJ SOLN
INTRAMUSCULAR | Status: AC
  Filled 2011-05-22: qty 4

## 2011-05-22 MED ORDER — PROMETHAZINE HCL 25 MG/ML IJ SOLN: 25.0000 mg | Freq: Four times a day (QID) | INTRAMUSCULAR | Status: DC | PRN

## 2011-05-22 MED ORDER — MIDAZOLAM HCL 5 MG/5ML IJ SOLN
INTRAMUSCULAR | Status: DC | PRN
  Administered 2011-05-22: 2 mg via INTRAVENOUS

## 2011-05-22 MED ORDER — FENTANYL CITRATE 0.05 MG/ML IJ SOLN
INTRAMUSCULAR | Status: DC | PRN
  Administered 2011-05-22 (×2): 50 ug via INTRAVENOUS

## 2011-05-22 MED ORDER — PROMETHAZINE HCL 25 MG/ML IJ SOLN
25.0000 mg | Freq: Once | INTRAMUSCULAR | Status: AC
  Administered 2011-05-22: 25 mg via INTRAVENOUS

## 2011-05-22 NOTE — H&P (Signed)
Samuel [name] is an 60 y.o. male.   Chief Complaint: patient with history of DVT and PE s/p IVC filter placement.  HPI: patient has been tolerating coumadin as OP without difficulty and more mobile at this point.  He has been covered with lovenox since coumadin has been on hold for filter retrieval.   Past Medical History  Diagnosis Date  . Diabetes mellitus   . Hypertension   . CAD (coronary artery disease)   . 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 Gastroparesis   . 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 GERD (gastroesophageal reflux disease)   . Hyperlipidemia   . Peripheral neuropathy     Past Surgical History  Procedure Date  . Cholecystectomy 01.2012  . Coronary stent placement 12 2010  . Left foot   . Ivc filter placement   . Charcoid joint surgery   . Left leg i&d   . Left leg rod   . Rt toe amputation   . Left shoulder arthroscopy     Family History  Problem Relation Age of Onset  . COPD Mother   . Heart disease Mother   . Lung cancer Mother    Social History:  reports that he has never smoked. He has never used smokeless tobacco. He reports that he does not drink alcohol or use illicit drugs.  Allergies:  Allergies  Allergen Reactions  . Codeine     REACTION: GI Upset  . Gabapentin     REACTION: rash/hives  . Nabumetone     REACTION: sick on stomach and rash    Medications Prior to Admission  Medication Sig Dispense Refill  . acetaminophen ([employer] ARTHRITIS PAIN RELIEF) 650 MG CR tablet Take 650 mg by mouth every 8 (eight) hours as needed.        . B Complex-C (B-50 COMPLEX/VITAMIN C PO) Take 1 tablet by mouth 2 (two) times daily.        . cephALEXin (KEFLEX) 500 MG capsule twice daily      . diphenhydramine-acetaminophen (EQ PAIN RELIEVER PM) 25-500 MG TABS Take 2 tablets by mouth at bedtime.        [name] EFFIENT 10 MG TABS Once daily      . Eszopiclone (ESZOPICLONE) 3 MG TABS Take 3 mg by mouth at bedtime as needed.       .  insulin detemir (LEVEMIR) 100 UNIT/ML injection Inject into the skin as directed.        . lansoprazole (PREVACID) 30 MG capsule Take 30 mg by mouth 2 (two) times daily.        . metoCLOPramide (REGLAN) 5 MG tablet Take 5 mg by mouth 2 (two) times daily.        . metoprolol succinate (TOPROL-XL) 25 MG 24 hr tablet Once daily      . ondansetron (ZOFRAN) 4 MG tablet Take 4 mg by mouth every 12 (twelve) hours as needed.        . pioglitazone (ACTOS) 30 MG tablet Take 30 mg by mouth daily.        . pregabalin (LYRICA) 300 MG capsule Take 300 mg by mouth 3 (three) times daily.        . promethazine (PHENERGAN) 25 MG tablet Take 25 mg by mouth 2 (two) times daily.        . quinapril (ACCUPRIL) 5 MG tablet Take 5 mg by mouth daily.        . simvastatin (ZOCOR)  20 MG tablet Take 20 mg by mouth at bedtime.        . sitaGLIPtan-metformin (JANUMET) 50-1000 MG per tablet Take 1 tablet by mouth 2 (two) times daily with a meal.        . warfarin (COUMADIN) 5 MG tablet Take 2 tabs by mouth daily or as directed.  60 tablet  0   No current facility-administered medications on file as of 05/22/2011.    No results found for this or any previous visit (from the past 48 hour(s)). No results found.  Review of Systems  Constitutional: Negative.   Eyes: Positive for blurred vision.  Respiratory: Positive for shortness of breath.   Cardiovascular: Negative for chest pain, palpitations and leg swelling.  Gastrointestinal: Positive for heartburn and nausea.       Persistent nausea secondary to gastroparesis   Genitourinary: Negative.   Skin: Negative.   Endo/Heme/Allergies: Bruises/bleeds easily.  Psychiatric/Behavioral: Negative.     Blood pressure 171/89, pulse 72, temperature 96.8 F (36 C), temperature source Oral, resp. rate 18, height 6\' 3"  (1.905 m), weight 290 lb (131.543 kg), SpO2 96.00%. Physical Exam  Constitutional: He is oriented to person, place, and time. He appears well-developed and  well-nourished.  HENT:  Head: Normocephalic and atraumatic.  Eyes: EOM are normal.  Neck: Normal range of motion.  Cardiovascular: Normal rate, regular rhythm and normal heart sounds.  Exam reveals no gallop and no friction rub.   No murmur heard. Respiratory: Breath sounds normal. He has no wheezes. He has no rales. He exhibits no tenderness.  GI: Bowel sounds are normal.  Musculoskeletal: Normal range of motion.  Neurological: He is alert and oriented to person, place, and time.  Skin: Skin is warm and dry. No rash noted. No erythema.  Psychiatric: He has a normal mood and affect. Judgment normal.     Assessment/Plan Patient with hx of PE and DVT tolerating coumadin and better ambulation.  Has been cleared by pulmonary team for IVC filter retrieval. Procedure details, risks and benefits discussed with patient with his apparent understanding.  Patient aware that filter's are not always easily retrieved after 3 months being in place.  He is aware and understands that filter may not be successfully removed.  Plan for attempt to remove once lab work reviewed and if labs WNL.   [name] D, PA-C 05/22/2011, 9:48 AM

## 2011-05-22 NOTE — ED Notes (Signed)
Had to go to Lt IJ.  Procedure started over now

## 2011-05-22 NOTE — Procedures (Signed)
IVC filter retrieval

## 2011-07-08 IMAGING — CR DG ABDOMEN 1V
3 series · 3 of 3 positions shown · non-contrast
Comparison: 01/14/2010.

CLINICAL DATA: Sudden onset of nausea and vomiting today.

ABDOMEN - 1 VIEW

[t abdomen supine * (1 of 3)]
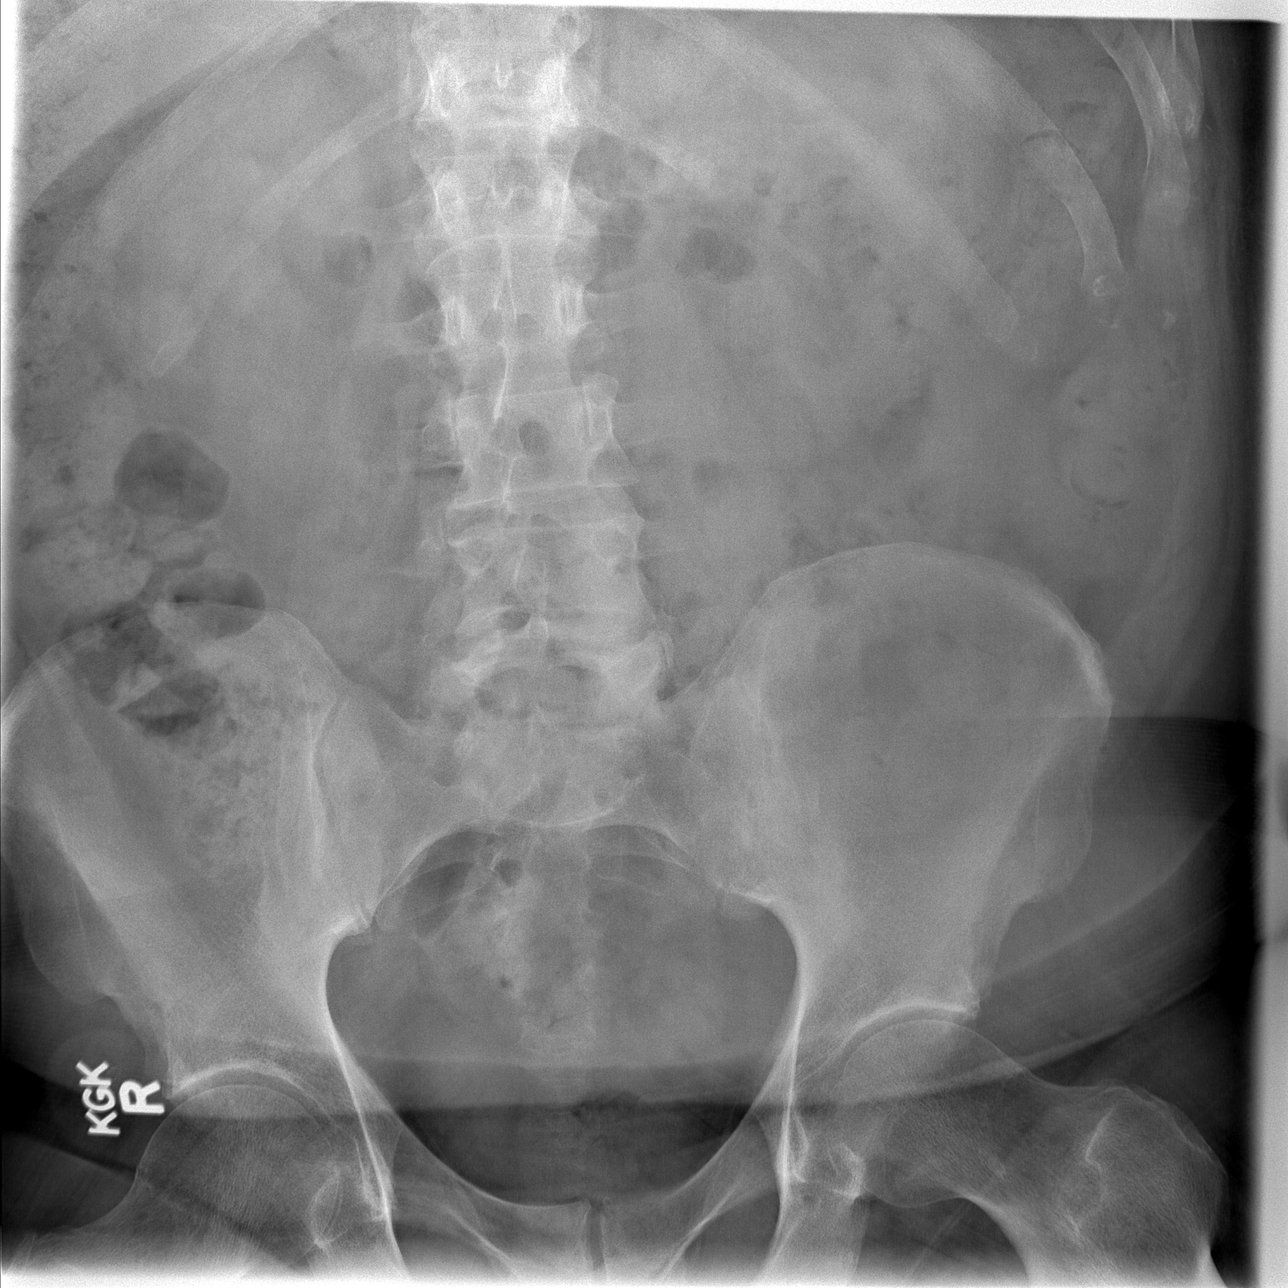

[t abdomen supine * (2 of 3)]
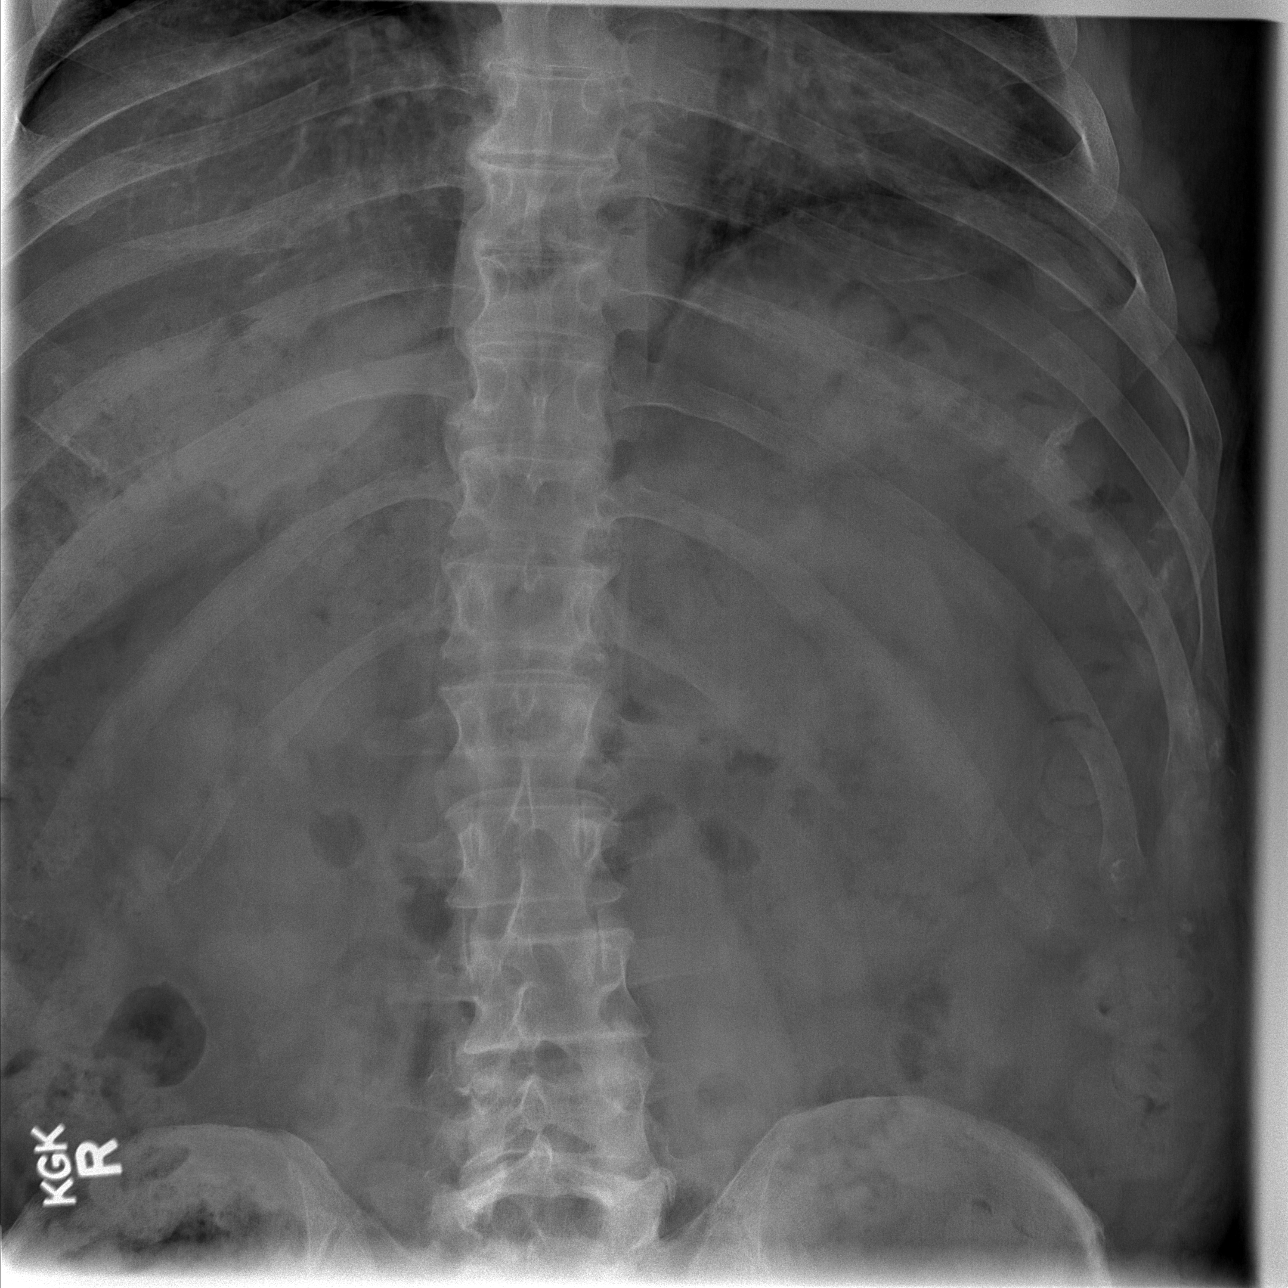

[t abdomen supine * (3 of 3)]
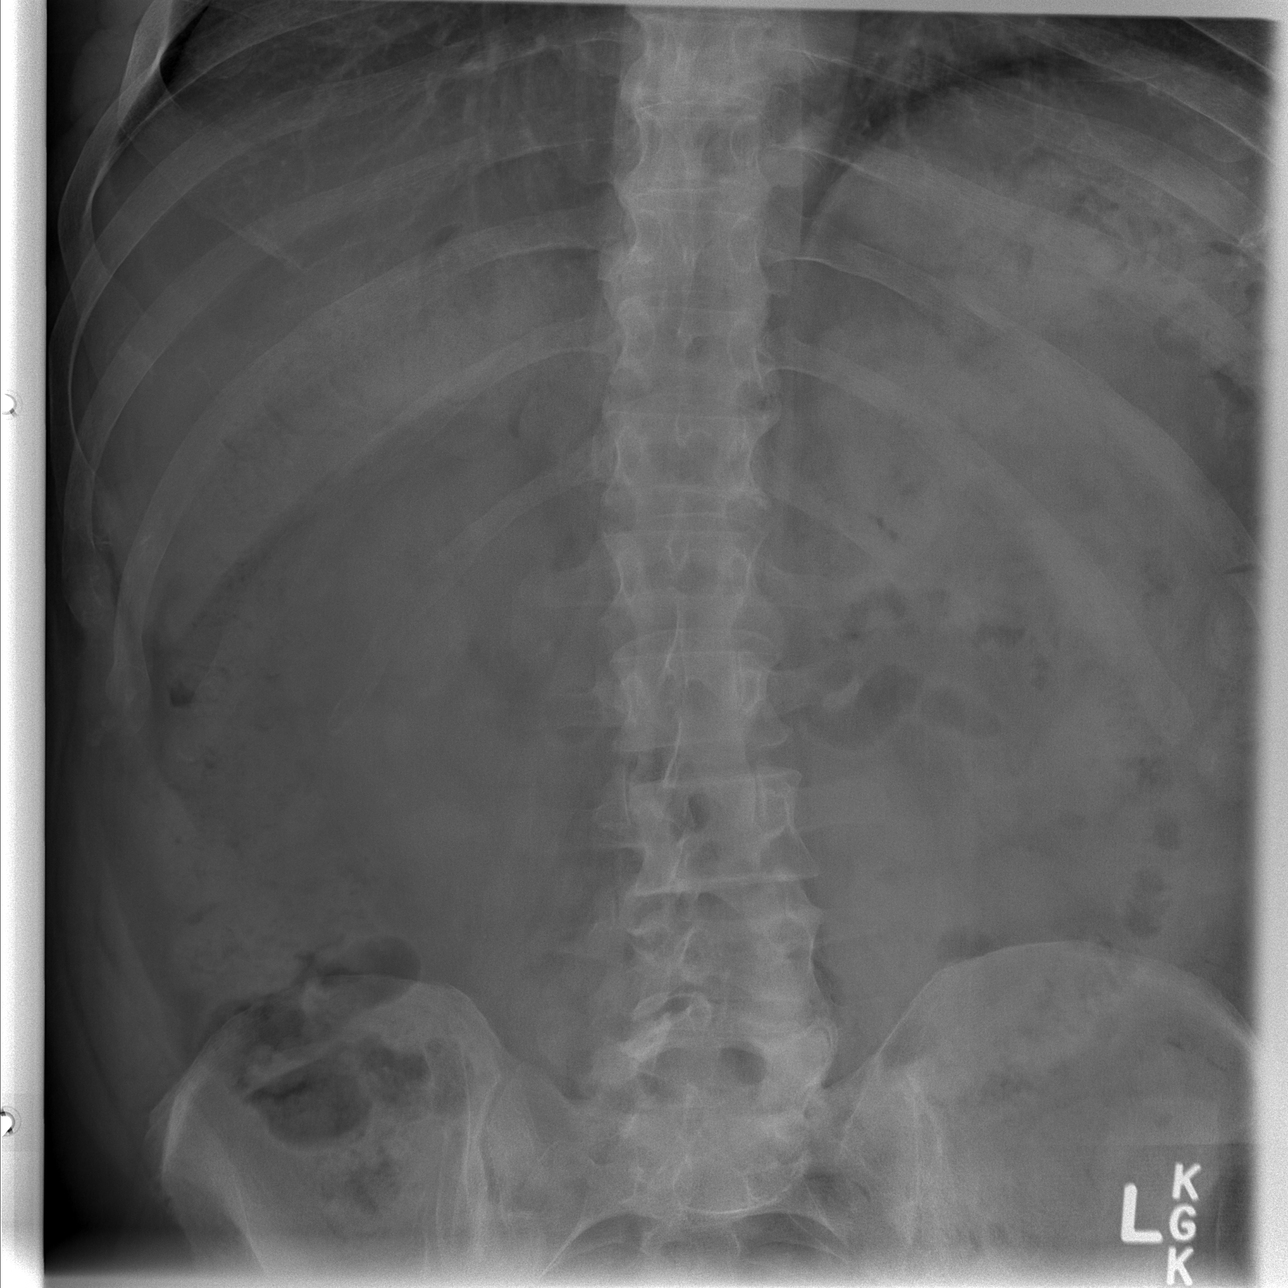

[3 of 3 positions shown; findings below may reference images not displayed]

FINDINGS: Normal bowel gas pattern.  Mildly prominent stool.  Mild
thoracolumbar scoliosis and degenerative changes.
IMPRESSION: No acute abnormality.  Mildly prominent stool.

## 2011-09-20 ENCOUNTER — Emergency Department (HOSPITAL_COMMUNITY)
Admission: EM | Admit: 2011-09-20 | Discharge: 2011-09-21 | Disposition: A | Discharge status: Home / Self Care | Payer: Medicare Other | Attending: Emergency Medicine | Admitting: Emergency Medicine

## 2011-09-20 ENCOUNTER — Encounter (HOSPITAL_COMMUNITY): Payer: Self-pay

## 2011-09-20 DIAGNOSIS — K219 Gastro-esophageal reflux disease without esophagitis: Secondary | ICD-10-CM | POA: Insufficient documentation

## 2011-09-20 DIAGNOSIS — S81009A Unspecified open wound, unspecified knee, initial encounter: Secondary | ICD-10-CM | POA: Insufficient documentation

## 2011-09-20 DIAGNOSIS — E119 Type 2 diabetes mellitus without complications: Secondary | ICD-10-CM | POA: Insufficient documentation

## 2011-09-20 DIAGNOSIS — T148XXA Other injury of unspecified body region, initial encounter: Secondary | ICD-10-CM

## 2011-09-20 DIAGNOSIS — G4730 Sleep apnea, unspecified: Secondary | ICD-10-CM | POA: Insufficient documentation

## 2011-09-20 DIAGNOSIS — Z794 Long term (current) use of insulin: Secondary | ICD-10-CM | POA: Insufficient documentation

## 2011-09-20 DIAGNOSIS — S91009A Unspecified open wound, unspecified ankle, initial encounter: Secondary | ICD-10-CM | POA: Insufficient documentation

## 2011-09-20 DIAGNOSIS — I10 Essential (primary) hypertension: Secondary | ICD-10-CM | POA: Insufficient documentation

## 2011-09-20 DIAGNOSIS — G5790 Unspecified mononeuropathy of unspecified lower limb: Secondary | ICD-10-CM | POA: Insufficient documentation

## 2011-09-20 DIAGNOSIS — E785 Hyperlipidemia, unspecified: Secondary | ICD-10-CM | POA: Insufficient documentation

## 2011-09-20 DIAGNOSIS — I2510 Atherosclerotic heart disease of native coronary artery without angina pectoris: Secondary | ICD-10-CM | POA: Insufficient documentation

## 2011-09-20 DIAGNOSIS — Z7901 Long term (current) use of anticoagulants: Secondary | ICD-10-CM | POA: Insufficient documentation

## 2011-09-20 DIAGNOSIS — X58XXXA Exposure to other specified factors, initial encounter: Secondary | ICD-10-CM | POA: Insufficient documentation

## 2011-09-20 DIAGNOSIS — L97809 Non-pressure chronic ulcer of other part of unspecified lower leg with unspecified severity: Secondary | ICD-10-CM | POA: Insufficient documentation

## 2011-09-20 NOTE — ED Notes (Signed)
Pt states has small lac to lt lower leg, unknown how, states has been bleeding for 2days, states on coumadin. States hx of DM neuropathy

## 2011-09-21 NOTE — Discharge Instructions (Signed)
Your Coumadin level was therapeutic at 2.23. Please continue taking your Coumadin as prescribed. Your wound has been dressed. Return to the ER with worsening bleeding from the area or any other concerns.  RESOURCE GUIDE  Dental Problems  Patients with Medicaid: [name] [phone] [address]                                           [id number] [employer] [address] Phone:  [phone]                                                  Phone:  [phone]  If unable to pay or uninsured, contact:  [facility] or [facility]. to become qualified for the adult dental clinic.  Chronic Pain Problems Contact [facility]  [phone] Patients need to be referred by their primary care doctor.  Insufficient Money for Medicine Contact [employer] Way:  call "211" or [facility] [phone] 640-2089.  No Primary Care Doctor Call [facility]  [phone] Other agencies that provide inexpensive medical care    [facility]  [phone]    [facility]  [phone]    [facility]  [phone]    [facility]  [phone]    [facility]  [phone]    [facility]  [phone]  Psychological Services [facility]  [phone] [facility]  [phone] [facility]   [phone] (emergency services 8644096959)  Substance Abuse Resources [facility]  [phone] [facility] [phone] [facility] 4245915408 Floydene Flock [phone] Residential & Outpatient Substance Abuse Program  [phone]  Abuse/Neglect [facility] Child Abuse Hotline 762-728-1121 [facility] Child Abuse Hotline 281-410-4952 (After Hours)  Emergency Shelter [facility] Minist[facility] [phone]  Maternity Homes [facility] at [facility] [phone] [facility] Services [phone]  MRSA Hotline #:    854-578-2512    [facility] Resources  [facility] [place]     [employer]                          [facility] Dept. [address] [place]                       [address]      [address]  [place]                                                Cristobal Goldmann Phone:  [phone]  Phone:  [phone]                 Phone:  [phone]  [facility] Mental Health Phone:  [phone]  [facility] Child Abuse Hotline 787-848-4444 337-329-4617 (After Hours)  Drug Monitoring, Therapeutic Therapeutic drug monitoring is used for measuring the level of some medications as a way to determine the most effective dose and or to avoid toxicity. WHY IS IT IMPORTANT? The drugs that are monitored have some special features. Most of them work best over a small range. Below this range, the drug is not effective and the patient begins having symptoms again; above this range, the drug has bad or toxic side effects that you want to avoid. Since many people take more than one medication, there may be interactions between the drugs that affect the way the body absorbs or metabolizes one of them. Also, some patients do not take medications as prescribed or instructed. Monitoring identifies these cases. WHAT DRUGS ARE MONITORED? There are several categories of drugs that require monitoring. The following are some of those drugs. DRUG CATEGORY: Cardiac drugs  DRUGS IN THAT CATEGORY: Digoxin, quinidine, procainamide, N-acetyl-procainamide (a metabolite of procainamide)   TREATMENT USE: Heart failure, angina, arrhythmias  DRUG CATEGORY: Antibiotics  DRUGS IN THAT CATEGORY: Aminoglycosides (gentamicin, tobramycin, amikacin) Vancomycin, Chloramphenicol   TREATMENT USE: Infections with bacteria that are resistant to less toxic antibiotics  DRUG CATEGORY: Antiepileptics  DRUGS IN THAT CATEGORY:  Phenobarbital, phenytoin, valproic acid, carbamazepine, ethosuximide, sometimes gabapentin, lamotrigine   TREATMENT USE: Epilepsy, prevention of seizures  DRUG CATEGORY: Bronchodilators  DRUGS IN THAT CATEGORY: Theophylline, caffeine   TREATMENT USE: Asthma, Chronic obstructive pulmonary disorder (COPD), neonatal apnea  DRUG CATEGORY: Immunosuppressants  DRUGS IN THAT CATEGORY: Cyclosporine, tacrolimus, sometimes sirolimus, mycophenolate mofetile   TREATMENT USE: Prevent rejection of transplanted organs  DRUG CATEGORY: Anti-cancer drugs  DRUGS IN THAT CATEGORY: Methotrexate   TREATMENT USE: Various cancers, rheumatoid arthritis, non-hodgkin's lymphomas, osteosarcoma, psoriasis  DRUG CATEGORY: Psychiatric drugs  DRUGS IN THAT CATEGORY: Lithium, desipramine, some antidepressants (imipramine, amitriptyline, nortriptyline, doxepin)   TREATMENT USE: bipolar disorder (manic depression), depression  HOW DOES TDM WORK? Through years of testing, the optimum blood level range for each drug has been determined. (Remember, though, that each of [place] is different and the best drug level for one person may be different from another person.) For most of the drugs listed, blood is drawn right before a dose is given. For some drugs, the blood collection will take place after the dose is given at a specific time (for example, 90 minutes later); this time will vary and may be as long as 8 hours after dosing). Drawing the blood at the correct time is very important. The amount of drug measured in the blood will be used to determine if a patient is getting the right amount of the medication and, in some cases, to calculate the amount of drug that will be given the next time. So, if you are scheduled to have your blood drawn for a drug level, make sure you know if you are to take the drug before or after the blood draw is done. If you forget and do the opposite (take the drug when you are supposed to wait), let  someone in the caregiver's office or lab know before the blood is collected. FAQ 1. How does my caregiver determine how much drug to give me?There are many factors to consider. Some of them are your weight, body composition,  age, temperature, gastric movement, nutrition, renal, liver, and heart conditions, burns, shock, and trauma.  2. What should I do if I forget to take my medication on time?Do not double your dose the next time. Consult your caregiver or pharmacist to find out what you should do.  3. Can I monitor myself at home?No. Usually blood must be collected at specific times and tests done on special laboratory equipment.  Document Released: 07/07/2004 Document Revised: 05/24/2011 Document Reviewed: 03/14/2005 [facility] Patient Information 2012 [place], [place].

## 2011-09-21 NOTE — ED Provider Notes (Signed)
History     CSN: [id number]  Arrival date & time 09/20/11  2321   First MD Initiated Contact with Patient 09/21/11 0114      Chief Complaint  Patient presents with  . Extremity Laceration    (Consider location/radiation/quality/duration/timing/severity/associated sxs/prior treatment) HPI Hx from pt. 61yo M with PMH CAD, DM, secondary peripheral neuropathy presents with a bleeding laceration to his L leg. He recalls no specific injury to the leg and has decreased sensation due to the neuropathy. He noted the area was bleeding last night but has continued to "ooze" throughout the day today. Pt is on warfarin due to hx PE. He states he has had to change the bandage several times throughout the day today. INR last checked sometime last month and was therapeutic. Tetanus is UTD.  Pt also has a slow healing ulcer to the anterior leg. States that he had to have orthopaedic surgery to have a rod inserted in the leg which later became infected and had to be I+Ded. The area of the incision has been slow to heal.  Past Medical History  Diagnosis Date  . Diabetes mellitus   . Hypertension   . CAD (coronary artery disease)   . 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 Gastroparesis   . 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 GERD (gastroesophageal reflux disease)   . Hyperlipidemia   . Peripheral neuropathy   . Sleep apnea     Past Surgical History  Procedure Date  . Cholecystectomy 01.2012  . Coronary stent placement 12 2010  . Left foot   . Ivc filter placement   . Charcoid joint surgery   . Left leg i&d   . Left leg rod   . Rt toe amputation   . Left shoulder arthroscopy     Family History  Problem Relation Age of Onset  . COPD Mother   . Heart disease Mother   . Lung cancer Mother     History  Substance Use Topics  . Smoking status: Never Smoker   . Smokeless tobacco: Never Used  . Alcohol Use: No      Review of Systems  Constitutional:  Negative.   Cardiovascular: Negative for leg swelling.  Musculoskeletal: Negative for joint swelling and gait problem.  Skin: Positive for wound.  Hematological: Bruises/bleeds easily.    Allergies  Codeine; Gabapentin; and Nabumetone  Home Medications   Current Outpatient Rx  Name Route Sig Dispense Refill  . ACETAMINOPHEN ER 650 MG PO TBCR Oral Take 650 mg by mouth every 8 (eight) hours as needed. For pain    . ASPIRIN 81 MG PO CHEW Oral Chew 81 mg by mouth daily.    . B-50 COMPLEX/VITAMIN C PO Oral Take 1 tablet by mouth 2 (two) times daily.      [name] DIPHENHYDRAMINE-APAP (SLEEP) 25-500 MG PO TABS Oral Take 2 tablets by mouth at bedtime.      [name] EFFIENT 10 MG PO TABS  Once daily    . LANSOPRAZOLE 30 MG PO CPDR Oral Take 30 mg by mouth 2 (two) times daily.      [name] METOCLOPRAMIDE HCL 5 MG PO TABS Oral Take 5 mg by mouth 2 (two) times daily.      [name] METOPROLOL SUCCINATE ER 25 MG PO TB24  Once daily    . ONDANSETRON HCL 4 MG PO TABS Oral Take 4 mg by mouth every 12 (twelve) hours as needed. For nausea    .  PIOGLITAZONE HCL 30 MG PO TABS Oral Take 30 mg by mouth daily.      [name] PREGABALIN 300 MG PO CAPS Oral Take 300 mg by mouth 3 (three) times daily.      [name] PROMETHAZINE HCL 25 MG PO TABS Oral Take 25 mg by mouth 2 (two) times daily. For nausea    . QUINAPRIL HCL 5 MG PO TABS Oral Take 5 mg by mouth daily.      [name] SIMVASTATIN 20 MG PO TABS Oral Take 20 mg by mouth at bedtime.      [name] SITAGLIPTIN-METFORMIN HCL 50-1000 MG PO TABS Oral Take 1 tablet by mouth 2 (two) times daily with a meal.      . WARFARIN SODIUM 5 MG PO TABS Oral Take 2.5-5 mg by mouth See admin instructions. 5mg  Tues- Sundays. On mondays take 2.5mg     . INSULIN DETEMIR 100 UNIT/ML Morton SOLN Subcutaneous Inject 40-70 Units into the skin See admin instructions. 40 in am, 70 in pm      BP 168/90  Pulse 78  Temp(Src) 98 F (36.7 C) (Oral)  Resp 20  SpO2 99%  Physical Exam  Nursing note and vitals reviewed. Constitutional: He  appears well-developed and well-nourished. No distress.  HENT:  Head: Normocephalic and atraumatic.  Neck: Normal range of motion.  Cardiovascular: Normal rate.   Pulmonary/Chest: Effort normal.  Musculoskeletal: Normal range of motion. He exhibits no edema and no tenderness.       Legs:      Healing ulcer with pink edges measuring approx 1 cm in diameter to anterior L shin as diagrammed. Draining tiny amount of serous material. No evidence for infx.  Tiny, superficial laceration measuring approx 1 cm in length to L posterolateral lower leg. Slow bleeding noted from the area.  Neurological: He is alert.  Skin: Skin is warm and dry. No rash noted. He is not diaphoretic.  Psychiatric: He has a normal mood and affect.    ED Course  Procedures (including critical care time)  Labs Reviewed  PROTIME-INR - Abnormal; Notable for the following:    Prothrombin Time 25.1 (*)    INR 2.23 (*)    All other components within normal limits   No results found.   1. Superficial laceration   2. Long term current use of anticoagulant       MDM  Pt on anticoagulation presents with persistently bleeding area to LLE. INR therapeutic. Wound does not appear deep enough that suture placement would be beneficial, and it is not rapidly bleeding. Wound cleaned, sm amount quickclot applied, and wound pressure dressed. Pt instructed to return with persistent bleeding or further concerns. He was agreeable with plan.        Grant Fontana, [place] 09/21/11 1744

## 2011-09-22 NOTE — ED Provider Notes (Signed)
Medical screening examination/treatment/procedure(s) were performed by non-physician practitioner and as supervising physician I was immediately available for consultation/collaboration.  Seanna Sisler T Kariya Lavergne, MD 09/22/11 0906 

## 2012-11-27 ENCOUNTER — Other Ambulatory Visit (HOSPITAL_COMMUNITY): Payer: Self-pay | Admitting: Orthopedic Surgery

## 2012-11-27 DIAGNOSIS — M869 Osteomyelitis, unspecified: Secondary | ICD-10-CM

## 2012-11-27 DIAGNOSIS — M79605 Pain in left leg: Secondary | ICD-10-CM

## 2012-12-04 ENCOUNTER — Encounter (HOSPITAL_COMMUNITY)
Admission: RE | Admit: 2012-12-04 | Discharge: 2012-12-04 | Disposition: A | Discharge status: Home / Self Care | Payer: Medicare Other | Source: Ambulatory Visit | Attending: Orthopedic Surgery | Admitting: Orthopedic Surgery

## 2012-12-04 ENCOUNTER — Other Ambulatory Visit (HOSPITAL_COMMUNITY): Payer: Self-pay | Admitting: Orthopedic Surgery

## 2012-12-04 ENCOUNTER — Encounter (HOSPITAL_COMMUNITY): Payer: Self-pay

## 2012-12-04 DIAGNOSIS — M869 Osteomyelitis, unspecified: Secondary | ICD-10-CM

## 2012-12-04 DIAGNOSIS — M79605 Pain in left leg: Secondary | ICD-10-CM

## 2012-12-04 DIAGNOSIS — M79609 Pain in unspecified limb: Secondary | ICD-10-CM | POA: Insufficient documentation

## 2012-12-04 MED ORDER — TECHNETIUM TC 99M MEDRONATE IV KIT
25.0000 | PACK | Freq: Once | INTRAVENOUS | Status: AC | PRN
  Administered 2012-12-04: 25 via INTRAVENOUS

## 2012-12-08 ENCOUNTER — Encounter: Payer: Self-pay | Admitting: Cardiovascular Disease

## 2013-01-05 ENCOUNTER — Encounter: Payer: Self-pay | Admitting: Internal Medicine

## 2013-01-05 ENCOUNTER — Other Ambulatory Visit: Payer: Self-pay | Admitting: Internal Medicine

## 2013-01-05 ENCOUNTER — Ambulatory Visit (INDEPENDENT_AMBULATORY_CARE_PROVIDER_SITE_OTHER): Payer: Medicare Other | Admitting: Internal Medicine

## 2013-01-05 VITALS — BP 125/84 | HR 94 | Temp 97.9°F | Ht 75.0 in | Wt 315.0 lb

## 2013-01-05 DIAGNOSIS — M869 Osteomyelitis, unspecified: Secondary | ICD-10-CM

## 2013-01-05 DIAGNOSIS — M9080 Osteopathy in diseases classified elsewhere, unspecified site: Secondary | ICD-10-CM

## 2013-01-05 DIAGNOSIS — M86179 Other acute osteomyelitis, unspecified ankle and foot: Secondary | ICD-10-CM

## 2013-01-05 DIAGNOSIS — E1169 Type 2 diabetes mellitus with other specified complication: Secondary | ICD-10-CM

## 2013-01-05 LAB — COMPLETE METABOLIC PANEL WITH GFR
Albumin: 4.1 g/dL (ref 3.5–5.2)
Alkaline Phosphatase: 40 U/L (ref 39–117)
BUN: 14 mg/dL (ref 6–23)
CO2: 27 mEq/L (ref 19–32)
GFR, Est African American: 75 mL/min
GFR, Est Non African American: 65 mL/min
Glucose, Bld: 187 mg/dL — ABNORMAL HIGH (ref 70–99)
Potassium: 4.5 mEq/L (ref 3.5–5.3)
Sodium: 141 mEq/L (ref 135–145)
Total Bilirubin: 0.6 mg/dL (ref 0.3–1.2)
Total Protein: 7.3 g/dL (ref 6.0–8.3)

## 2013-01-05 LAB — SEDIMENTATION RATE: Sed Rate: 6 mm/hr (ref 0–16)

## 2013-01-05 LAB — CBC WITH DIFFERENTIAL/PLATELET
Basophils Absolute: 0 10*3/uL (ref 0.0–0.1)
Basophils Relative: 1 % (ref 0–1)
Lymphocytes Relative: 23 % (ref 12–46)
MCHC: 34.1 g/dL (ref 30.0–36.0)
Neutro Abs: 3.7 10*3/uL (ref 1.7–7.7)
Neutrophils Relative %: 66 % (ref 43–77)
RDW: 14.2 % (ref 11.5–15.5)
WBC: 5.5 10*3/uL (ref 4.0–10.5)

## 2013-01-05 MED ORDER — SODIUM CHLORIDE 0.9 % IV SOLN: 600.0000 mg | Freq: Two times a day (BID) | INTRAVENOUS | Status: DC

## 2013-01-05 NOTE — Progress Notes (Signed)
  Subjective:    Patient ID: Samuel [name], male    DOB: [birth year], 62 y.o.   MRN: [id number]  HPI Comes in for reevaluation of osteomyelitis. He has a history of type 1 diabetes and a history of Charcot foot and had debridement in May of 2012 and treated for MSSA osteomyelitis. He completed 6 weeks of therapy and had reassuring inflammatory markers that time however he tells me he developed more pain and inflammation after that time and an open wound on his left shin with drainage that has never really healed. Over the last year, he tells me he has been on multiple antibiotics, most recently with doxycycline. It has improved some but he continues to have some discharge. He was started by his primary physician on antibiotics and continued by his dermatologist but had persistent issues. He went back to see Dr. Victorino Dike again who did recommend some surgery however he is hopeful to avoid any surgery. He did get a bone scan by him which does show some possible uptake in the region concerning for osteomyelitis. He denies any fever or chills. No diarrhea   Review of Systems  Constitutional: Negative for fever and chills.  Respiratory: Negative for shortness of breath.   Cardiovascular: Positive for leg swelling.  Gastrointestinal: Negative for diarrhea.  Musculoskeletal: Negative for joint swelling.  Skin: Negative for rash.  Neurological: Negative for dizziness, light-headedness and headaches.  Hematological: Negative for adenopathy.  Psychiatric/Behavioral: Negative for dysphoric mood.       Objective:   Physical Exam  Constitutional: He appears well-developed and well-nourished. No distress.  Eyes: No scleral icterus.  Cardiovascular: Normal rate, regular rhythm and normal heart sounds.   No murmur heard. Skin:  Shin with some redness but no warmth or pain. He does have a small site with some serosanguineous discharge          Assessment & Plan:

## 2013-01-05 NOTE — Assessment & Plan Note (Addendum)
With the bone scan and the continued discharge and persistent antibiotic use and concern with true osteomyelitis. Because of the concern of the nonhealing nature, I am going to start him empirically on antibiotics with Teflaro. I'm also going to check his inflammatory markers today. I did tell him that if he has no success with the IV antibiotics, the second option I do feel would be surgical debridement if that is an option and that without successful clearing of this he may need amputation and a worse case scenario. I will have him return in 2 weeks. He will stop his doxycycline. 40 minutes spent with 25 minutes of face to face contact

## 2013-01-06 ENCOUNTER — Telehealth: Payer: Self-pay | Admitting: *Deleted

## 2013-01-06 ENCOUNTER — Other Ambulatory Visit: Payer: Self-pay | Admitting: Internal Medicine

## 2013-01-06 ENCOUNTER — Inpatient Hospital Stay (HOSPITAL_COMMUNITY): Admission: RE | Admit: 2013-01-06 | Payer: Medicare Other | Source: Ambulatory Visit

## 2013-01-06 ENCOUNTER — Other Ambulatory Visit: Payer: Self-pay | Admitting: *Deleted

## 2013-01-06 DIAGNOSIS — M86179 Other acute osteomyelitis, unspecified ankle and foot: Secondary | ICD-10-CM

## 2013-01-06 DIAGNOSIS — M869 Osteomyelitis, unspecified: Secondary | ICD-10-CM

## 2013-01-06 LAB — C-REACTIVE PROTEIN: CRP: <0.5 mg/dL (ref <0.60)

## 2013-01-06 MED ORDER — CEPHALEXIN 500 MG PO CAPS: 500.0000 mg | ORAL_CAPSULE | Freq: Four times a day (QID) | ORAL | Status: DC

## 2013-01-06 NOTE — Telephone Encounter (Signed)
Pt unable to afford any IV antibiotic therapy. Pt did not receive PICC line.  RN spoke with patient, explained the oral therapy option that was left.  Patient will stop doxy.  Patient verbalized agreement, will call with any questions or concerns.  Rx sent to pharmacy of choice. Andree Coss, RN

## 2013-01-06 NOTE — Telephone Encounter (Signed)
Call from [facility] with [facility], patient states he can not afford the antibiotics which would be 173.00 per week out of pocket. Samuel [name]

## 2013-01-07 ENCOUNTER — Other Ambulatory Visit: Payer: Self-pay | Admitting: *Deleted

## 2013-01-07 DIAGNOSIS — M86179 Other acute osteomyelitis, unspecified ankle and foot: Secondary | ICD-10-CM

## 2013-01-07 MED ORDER — CEPHALEXIN 500 MG PO CAPS: 500.0000 mg | ORAL_CAPSULE | Freq: Four times a day (QID) | ORAL | Status: DC

## 2013-01-22 ENCOUNTER — Ambulatory Visit: Payer: Medicare Other | Admitting: Internal Medicine

## 2013-02-23 ENCOUNTER — Emergency Department (HOSPITAL_COMMUNITY)
Admission: EM | Admit: 2013-02-23 | Discharge: 2013-02-24 | Disposition: A | Discharge status: Home / Self Care | Payer: Medicare Other | Attending: Emergency Medicine | Admitting: Emergency Medicine

## 2013-02-23 ENCOUNTER — Encounter (HOSPITAL_COMMUNITY): Payer: Self-pay | Admitting: Emergency Medicine

## 2013-02-23 DIAGNOSIS — I2510 Atherosclerotic heart disease of native coronary artery without angina pectoris: Secondary | ICD-10-CM | POA: Insufficient documentation

## 2013-02-23 DIAGNOSIS — E785 Hyperlipidemia, unspecified: Secondary | ICD-10-CM | POA: Insufficient documentation

## 2013-02-23 DIAGNOSIS — K3184 Gastroparesis: Secondary | ICD-10-CM | POA: Insufficient documentation

## 2013-02-23 DIAGNOSIS — Z7902 Long term (current) use of antithrombotics/antiplatelets: Secondary | ICD-10-CM | POA: Insufficient documentation

## 2013-02-23 DIAGNOSIS — Z794 Long term (current) use of insulin: Secondary | ICD-10-CM | POA: Insufficient documentation

## 2013-02-23 DIAGNOSIS — G609 Hereditary and idiopathic neuropathy, unspecified: Secondary | ICD-10-CM | POA: Insufficient documentation

## 2013-02-23 DIAGNOSIS — R1110 Vomiting, unspecified: Secondary | ICD-10-CM

## 2013-02-23 DIAGNOSIS — R112 Nausea with vomiting, unspecified: Secondary | ICD-10-CM | POA: Insufficient documentation

## 2013-02-23 DIAGNOSIS — Z79899 Other long term (current) drug therapy: Secondary | ICD-10-CM | POA: Insufficient documentation

## 2013-02-23 DIAGNOSIS — Z7982 Long term (current) use of aspirin: Secondary | ICD-10-CM | POA: Insufficient documentation

## 2013-02-23 DIAGNOSIS — I10 Essential (primary) hypertension: Secondary | ICD-10-CM | POA: Insufficient documentation

## 2013-02-23 DIAGNOSIS — E1143 Type 2 diabetes mellitus with diabetic autonomic (poly)neuropathy: Secondary | ICD-10-CM

## 2013-02-23 DIAGNOSIS — E1349 Other specified diabetes mellitus with other diabetic neurological complication: Secondary | ICD-10-CM | POA: Insufficient documentation

## 2013-02-23 LAB — COMPREHENSIVE METABOLIC PANEL WITH GFR
Alkaline Phosphatase: 49 U/L (ref 39–117)
BUN: 15 mg/dL (ref 6–23)
CO2: 19 meq/L (ref 19–32)
Calcium: 9.9 mg/dL (ref 8.4–10.5)
GFR calc Af Amer: >90 mL/min (ref >90)
GFR calc non Af Amer: >90 mL/min (ref >90)
Glucose, Bld: 235 mg/dL — ABNORMAL HIGH (ref 70–99)
Total Protein: 7.7 g/dL (ref 6.0–8.3)

## 2013-02-23 LAB — CBC WITH DIFFERENTIAL/PLATELET
Basophils Absolute: 0 10*3/uL (ref 0.0–0.1)
Basophils Relative: 0 % (ref 0–1)
Eosinophils Absolute: 0 K/uL (ref 0.0–0.7)
Eosinophils Relative: 0 % (ref 0–5)
HCT: 44.3 % (ref 39.0–52.0)
Hemoglobin: 15.7 g/dL (ref 13.0–17.0)
Lymphocytes Relative: 16 % (ref 12–46)
Lymphs Abs: 1.3 K/uL (ref 0.7–4.0)
MCH: 31.8 pg (ref 26.0–34.0)
MCHC: 35.4 g/dL (ref 30.0–36.0)
MCV: 89.9 fL (ref 78.0–100.0)
Monocytes Absolute: 0.5 10*3/uL (ref 0.1–1.0)
Monocytes Relative: 6 % (ref 3–12)
Neutro Abs: 6.7 10*3/uL (ref 1.7–7.7)
Neutrophils Relative %: 78 % — ABNORMAL HIGH (ref 43–77)
Platelets: 194 10*3/uL (ref 150–400)
RBC: 4.93 MIL/uL (ref 4.22–5.81)
RDW: 12.7 % (ref 11.5–15.5)
WBC: 8.6 10*3/uL (ref 4.0–10.5)

## 2013-02-23 LAB — COMPREHENSIVE METABOLIC PANEL
ALT: 31 U/L (ref 0–53)
AST: 36 U/L (ref 0–37)
Albumin: 4 g/dL (ref 3.5–5.2)
Chloride: 95 mEq/L — ABNORMAL LOW (ref 96–112)
Creatinine, Ser: 0.9 mg/dL (ref 0.50–1.35)
Potassium: 5 mEq/L (ref 3.5–5.1)
Sodium: 135 mEq/L (ref 135–145)
Total Bilirubin: 0.8 mg/dL (ref 0.3–1.2)

## 2013-02-23 LAB — GLUCOSE, CAPILLARY: Glucose-Capillary: 196 mg/dL — ABNORMAL HIGH (ref 70–99)

## 2013-02-23 LAB — LIPASE, BLOOD: Lipase: 19 U/L (ref 11–59)

## 2013-02-23 MED ORDER — SODIUM CHLORIDE 0.9 % IV BOLUS (SEPSIS)
1000.0000 mL | Freq: Once | INTRAVENOUS | Status: AC
  Administered 2013-02-23: 1000 mL via INTRAVENOUS

## 2013-02-23 MED ORDER — METOCLOPRAMIDE HCL 5 MG/ML IJ SOLN
10.0000 mg | Freq: Once | INTRAMUSCULAR | Status: AC
  Administered 2013-02-23: 10 mg via INTRAVENOUS
  Filled 2013-02-23: qty 2

## 2013-02-23 MED ORDER — PROMETHAZINE HCL 25 MG/ML IJ SOLN
25.0000 mg | Freq: Once | INTRAMUSCULAR | Status: AC
  Administered 2013-02-23: 25 mg via INTRAVENOUS
  Filled 2013-02-23: qty 1

## 2013-02-23 MED ORDER — METOCLOPRAMIDE HCL 5 MG/ML IJ SOLN: 10.0000 mg | Freq: Once | INTRAMUSCULAR | Status: DC

## 2013-02-23 MED ORDER — ONDANSETRON HCL 4 MG/2ML IJ SOLN: 4.0000 mg | Freq: Once | INTRAMUSCULAR | Status: DC

## 2013-02-23 NOTE — ED Notes (Signed)
Pt states he started having N/V last night and it has worsened today. Hx of gastropareisis. Diabetic.

## 2013-02-23 NOTE — ED Provider Notes (Signed)
CSN: [id number]     Arrival date & time 02/23/13  2102 History   First MD Initiated Contact with Patient 02/23/13 2154     Chief Complaint  Patient presents with  . Emesis   (Consider location/radiation/quality/duration/timing/severity/associated sxs/prior Treatment) HPI Comments: Patient is a 62 year old male past medical history significant for DM, hypertension, CAD, GERD, gastroparesis presented to the emergency department with gastroparesis flareup. Patient states he has had nonbloody nonbilious vomiting that began last week and has been getting progressively worse. He states he is unable to control the vomiting. He endorses subjective chills but has not taken her temperature. The patient denies any abdominal pain. Patient states only Phenergan works for his nausea.  Patient is a 62 y.o. male presenting with vomiting.  Emesis Associated symptoms: no abdominal pain and no diarrhea     Past Medical History  Diagnosis Date  . Diabetes mellitus   . Hypertension   . CAD (coronary artery disease)   . 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 Gastroparesis   . 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 GERD (gastroesophageal reflux disease)   . Hyperlipidemia   . Peripheral neuropathy   . Sleep apnea    Past Surgical History  Procedure Laterality Date  . Cholecystectomy  01.2012  . Coronary stent placement  12 2010  . Left foot    . Ivc filter placement    . Charcoid joint surgery    . Left leg i&d    . Left leg rod    . Rt toe amputation    . Left shoulder arthroscopy     Family History  Problem Relation Age of Onset  . COPD Mother   . Heart disease Mother   . Lung cancer Mother    History  Substance Use Topics  . Smoking status: Never Smoker   . Smokeless tobacco: Never Used  . Alcohol Use: No    Review of Systems  Constitutional: Negative for fever.  HENT: Negative.   Eyes: Negative.   Respiratory: Negative for shortness of breath.    Cardiovascular: Negative for chest pain.  Gastrointestinal: Positive for nausea and vomiting. Negative for abdominal pain, diarrhea, constipation, blood in stool, abdominal distention, anal bleeding and rectal pain.  Genitourinary: Negative.   Musculoskeletal: Negative.   Skin: Negative.   Neurological: Negative.     Allergies  Codeine; Gabapentin; and Nabumetone  Home Medications   Current Outpatient Rx  Name  Route  Sig  Dispense  Refill  . acetaminophen ([facility] ARTHRITIS PAIN RELIEF) 650 MG CR tablet   Oral   Take 650 mg by mouth every 8 (eight) hours as needed. For pain         . aspirin 81 MG chewable tablet   Oral   Chew 81 mg by mouth daily.         . B Complex-C (B-50 COMPLEX/VITAMIN C PO)   Oral   Take 1 tablet by mouth 2 (two) times daily.           . Clobetasol Prop Emollient Base 0.05 % emollient cream   Topical   Apply topically 2 (two) times daily.         . Dapagliflozin Propanediol (FARXIGA) 5 MG TABS   Oral   Take 5 mg by mouth daily.         . diphenhydramine-acetaminophen (EQ PAIN RELIEVER PM) 25-500 MG TABS   Oral   Take 2 tablets by mouth at bedtime.           [name]  docusate sodium (COLACE) 100 MG capsule   Oral   Take 100 mg by mouth daily.         [name] doxycycline (DORYX) 100 MG DR capsule   Oral   Take 100 mg by mouth 2 (two) times daily.         [name] EFFIENT 10 MG TABS      Once daily         . insulin detemir (LEVEMIR) 100 UNIT/ML injection   Subcutaneous   Inject 40-70 Units into the skin See admin instructions. 40 in am, 60 in pm         . insulin lispro (HUMALOG) 100 UNIT/ML injection   Subcutaneous   Inject into the skin 3 (three) times daily before meals. Sliding scale         . Iron 66 MG TABS   Oral   Take 1 tablet by mouth daily.         . lansoprazole (PREVACID) 30 MG capsule   Oral   Take 30 mg by mouth 2 (two) times daily.           . metoCLOPramide (REGLAN) 5 MG tablet   Oral   Take 5 mg by mouth  2 (two) times daily.           . metoprolol succinate (TOPROL-XL) 25 MG 24 hr tablet      Once daily         . naproxen sodium (ANAPROX) 220 MG tablet   Oral   Take 220 mg by mouth 2 (two) times daily as needed.         . ondansetron (ZOFRAN) 4 MG tablet   Oral   Take 4 mg by mouth every 12 (twelve) hours as needed. For nausea         . pioglitazone (ACTOS) 30 MG tablet   Oral   Take 30 mg by mouth daily.          . pregabalin (LYRICA) 300 MG capsule   Oral   Take 300 mg by mouth 2 (two) times daily.          . promethazine (PHENERGAN) 25 MG tablet   Oral   Take 25 mg by mouth 2 (two) times daily. For nausea         . promethazine (PHENERGAN) 25 MG tablet   Oral   Take 1 tablet (25 mg total) by mouth every 6 (six) hours as needed for nausea.   12 tablet   0   . quinapril (ACCUPRIL) 5 MG tablet   Oral   Take 5 mg by mouth daily.           . simvastatin (ZOCOR) 20 MG tablet   Oral   Take 40 mg by mouth 2 (two) times daily.          . sitaGLIPtan-metformin (JANUMET) 50-1000 MG per tablet   Oral   Take 2 tablets by mouth daily.          . sodium chloride 0.9 % SOLN 250 mL with ceftaroline 600 MG SOLR 600 mg   Intravenous   Inject 600 mg into the vein every 12 (twelve) hours. 6 weeks   600 mg   0   . EXPIRED: warfarin (COUMADIN) 5 MG tablet   Oral   Take 2.5-5 mg by mouth See admin instructions. 5mg  Tues- Sundays. On mondays take 2.5mg           BP 144/80  Pulse 104  Temp(Src) 97.9 F (36.6 C) (Oral)  Resp 20  Ht 6\' 2"  (1.88 m)  Wt 315 lb (142.883 kg)  BMI 40.43 kg/m2  SpO2 100% Physical Exam  Constitutional: He is oriented to person, place, and time. He appears well-developed and well-nourished.  HENT:  Head: Normocephalic and atraumatic.  Right Ear: External ear normal.  Left Ear: External ear normal.  Nose: Nose normal.  Eyes: Conjunctivae are normal.  Neck: Normal range of motion. Neck supple.  Cardiovascular: Normal rate,  regular rhythm, normal heart sounds and intact distal pulses.   Pulmonary/Chest: Effort normal and breath sounds normal.  Abdominal: Soft. He exhibits no distension. There is no tenderness. There is no rebound and no guarding.  Musculoskeletal: Normal range of motion.  Neurological: He is alert and oriented to person, place, and time.  Skin: Skin is warm and dry. He is not diaphoretic.    ED Course  Procedures (including critical care time)  Medications  sodium chloride 0.9 % bolus 1,000 mL (0 mLs Intravenous Stopped 02/23/13 2311)  promethazine (PHENERGAN) injection 25 mg (25 mg Intravenous Given 02/23/13 2216)  metoCLOPramide (REGLAN) injection 10 mg (10 mg Intravenous Given 02/23/13 2311)    Labs Review Labs Reviewed  CBC WITH DIFFERENTIAL - Abnormal; Notable for the following:    Neutrophils Relative % 78 (*)    All other components within normal limits  COMPREHENSIVE METABOLIC PANEL - Abnormal; Notable for the following:    Chloride 95 (*)    Glucose, Bld 235 (*)    All other components within normal limits  GLUCOSE, CAPILLARY - Abnormal; Notable for the following:    Glucose-Capillary 196 (*)    All other components within normal limits  LIPASE, BLOOD   Imaging Review No results found.  MDM   1. Emesis   2. Gastroparesis due to DM    Afebrile, NAD, non-toxic appearing, AAOx4. I have reviewed nursing notes, vital signs, and all appropriate lab results for this patient. Abdomen benign, nonsurgical. Physical exam otherwise unremarkable. Witnessed episodes of emesis, nonbloody nonbilious. Nausea and emesis controlled and ED. Patient reports marked improvement in symptoms and requesting to go home. I do not feel this time that there is other acute pathology causing nausea and emesis given past medical history, history, physical exam findings, laboratory findings, recurrent episodes of gastroparesis. No anion gap Abdomen remained benign on evaluation. Return precautions discussed.  Patient agreeable to plan. Patient will be discharged home with symptomatic care. Patient stable at time of discharge. Patient d/w with Dr. Oletta Lamas, agrees with plan.          Jeannetta Ellis, PA-C 02/24/13 0122  Lise Auer Jacarie Pate, PA-C 02/24/13 0122

## 2013-02-24 ENCOUNTER — Emergency Department (HOSPITAL_COMMUNITY): Payer: Medicare Other

## 2013-02-24 ENCOUNTER — Emergency Department (HOSPITAL_COMMUNITY)
Admission: EM | Admit: 2013-02-24 | Discharge: 2013-02-24 | Disposition: A | Discharge status: Home / Self Care | Payer: Medicare Other | Source: Home / Self Care | Attending: Emergency Medicine | Admitting: Emergency Medicine

## 2013-02-24 ENCOUNTER — Encounter (HOSPITAL_COMMUNITY): Payer: Self-pay

## 2013-02-24 DIAGNOSIS — R112 Nausea with vomiting, unspecified: Secondary | ICD-10-CM

## 2013-02-24 DIAGNOSIS — Z7982 Long term (current) use of aspirin: Secondary | ICD-10-CM | POA: Insufficient documentation

## 2013-02-24 DIAGNOSIS — E119 Type 2 diabetes mellitus without complications: Secondary | ICD-10-CM | POA: Insufficient documentation

## 2013-02-24 DIAGNOSIS — R000 Tachycardia, unspecified: Secondary | ICD-10-CM | POA: Insufficient documentation

## 2013-02-24 DIAGNOSIS — K219 Gastro-esophageal reflux disease without esophagitis: Secondary | ICD-10-CM | POA: Insufficient documentation

## 2013-02-24 DIAGNOSIS — E785 Hyperlipidemia, unspecified: Secondary | ICD-10-CM | POA: Insufficient documentation

## 2013-02-24 DIAGNOSIS — Z794 Long term (current) use of insulin: Secondary | ICD-10-CM | POA: Insufficient documentation

## 2013-02-24 DIAGNOSIS — E669 Obesity, unspecified: Secondary | ICD-10-CM | POA: Insufficient documentation

## 2013-02-24 DIAGNOSIS — G4730 Sleep apnea, unspecified: Secondary | ICD-10-CM | POA: Insufficient documentation

## 2013-02-24 DIAGNOSIS — I10 Essential (primary) hypertension: Secondary | ICD-10-CM | POA: Insufficient documentation

## 2013-02-24 DIAGNOSIS — I2510 Atherosclerotic heart disease of native coronary artery without angina pectoris: Secondary | ICD-10-CM | POA: Insufficient documentation

## 2013-02-24 DIAGNOSIS — Z79899 Other long term (current) drug therapy: Secondary | ICD-10-CM | POA: Insufficient documentation

## 2013-02-24 DIAGNOSIS — E1349 Other specified diabetes mellitus with other diabetic neurological complication: Secondary | ICD-10-CM | POA: Insufficient documentation

## 2013-02-24 DIAGNOSIS — Z9861 Coronary angioplasty status: Secondary | ICD-10-CM | POA: Insufficient documentation

## 2013-02-24 DIAGNOSIS — Z9089 Acquired absence of other organs: Secondary | ICD-10-CM | POA: Insufficient documentation

## 2013-02-24 DIAGNOSIS — G609 Hereditary and idiopathic neuropathy, unspecified: Secondary | ICD-10-CM | POA: Insufficient documentation

## 2013-02-24 LAB — CBC WITH DIFFERENTIAL/PLATELET
Basophils Absolute: 0 10*3/uL (ref 0.0–0.1)
Basophils Relative: 0 % (ref 0–1)
Eosinophils Absolute: 0 10*3/uL (ref 0.0–0.7)
Eosinophils Relative: 0 % (ref 0–5)
HCT: 41.3 % (ref 39.0–52.0)
Hemoglobin: 14.2 g/dL (ref 13.0–17.0)
Lymphocytes Relative: 13 % (ref 12–46)
Lymphs Abs: 1 10*3/uL (ref 0.7–4.0)
MCH: 31 pg (ref 26.0–34.0)
MCHC: 34.4 g/dL (ref 30.0–36.0)
MCV: 90.2 fL (ref 78.0–100.0)
Monocytes Absolute: 0.6 10*3/uL (ref 0.1–1.0)
Monocytes Relative: 7 % (ref 3–12)
Neutro Abs: 6.5 10*3/uL (ref 1.7–7.7)
Neutrophils Relative %: 80 % — ABNORMAL HIGH (ref 43–77)
Platelets: 165 10*3/uL (ref 150–400)
RBC: 4.58 MIL/uL (ref 4.22–5.81)
RDW: 12.9 % (ref 11.5–15.5)
WBC: 8.1 10*3/uL (ref 4.0–10.5)

## 2013-02-24 LAB — COMPREHENSIVE METABOLIC PANEL
ALT: 25 U/L (ref 0–53)
AST: 23 U/L (ref 0–37)
Albumin: 3.4 g/dL — ABNORMAL LOW (ref 3.5–5.2)
Alkaline Phosphatase: 42 U/L (ref 39–117)
BUN: 15 mg/dL (ref 6–23)
CO2: 22 mEq/L (ref 19–32)
Calcium: 9.2 mg/dL (ref 8.4–10.5)
Chloride: 97 mEq/L (ref 96–112)
Creatinine, Ser: 0.95 mg/dL (ref 0.50–1.35)
GFR calc Af Amer: >90 mL/min (ref >90)
GFR calc non Af Amer: 89 mL/min — ABNORMAL LOW (ref >90)
Glucose, Bld: 242 mg/dL — ABNORMAL HIGH (ref 70–99)
Potassium: 4.3 mEq/L (ref 3.5–5.1)
Sodium: 133 mEq/L — ABNORMAL LOW (ref 135–145)
Total Bilirubin: 0.7 mg/dL (ref 0.3–1.2)
Total Protein: 6.6 g/dL (ref 6.0–8.3)

## 2013-02-24 LAB — URINALYSIS, ROUTINE W REFLEX MICROSCOPIC
Bilirubin Urine: NEGATIVE
Glucose, UA: >1000 mg/dL — AB
Hgb urine dipstick: NEGATIVE
Ketones, ur: >80 mg/dL — AB
Leukocytes, UA: NEGATIVE
Nitrite: NEGATIVE
Protein, ur: NEGATIVE mg/dL
Specific Gravity, Urine: 1.036 — ABNORMAL HIGH (ref 1.005–1.030)
Urobilinogen, UA: 0.2 mg/dL (ref 0.0–1.0)
pH: 5 (ref 5.0–8.0)

## 2013-02-24 LAB — LIPASE, BLOOD: Lipase: 17 U/L (ref 11–59)

## 2013-02-24 LAB — GLUCOSE, CAPILLARY: Glucose-Capillary: 190 mg/dL — ABNORMAL HIGH (ref 70–99)

## 2013-02-24 LAB — URINE MICROSCOPIC-ADD ON

## 2013-02-24 MED ORDER — PROMETHAZINE HCL 25 MG PO TABS: 25.0000 mg | ORAL_TABLET | Freq: Four times a day (QID) | ORAL | Status: DC | PRN

## 2013-02-24 MED ORDER — PROMETHAZINE HCL 25 MG/ML IJ SOLN
12.5000 mg | Freq: Once | INTRAMUSCULAR | Status: AC
  Administered 2013-02-24: 12.5 mg via INTRAVENOUS
  Filled 2013-02-24: qty 1

## 2013-02-24 MED ORDER — SODIUM CHLORIDE 0.9 % IV BOLUS (SEPSIS)
1000.0000 mL | Freq: Once | INTRAVENOUS | Status: AC
  Administered 2013-02-24: 1000 mL via INTRAVENOUS

## 2013-02-24 MED ORDER — METOCLOPRAMIDE HCL 5 MG/ML IJ SOLN
10.0000 mg | Freq: Once | INTRAMUSCULAR | Status: AC
  Administered 2013-02-24: 10 mg via INTRAVENOUS
  Filled 2013-02-24: qty 2

## 2013-02-24 MED ORDER — INSULIN ASPART 100 UNIT/ML ~~LOC~~ SOLN
10.0000 [IU] | Freq: Once | SUBCUTANEOUS | Status: AC
  Administered 2013-02-24: 10 [IU] via INTRAVENOUS
  Filled 2013-02-24: qty 1

## 2013-02-24 MED ORDER — PROMETHAZINE HCL 25 MG/ML IJ SOLN
25.0000 mg | Freq: Once | INTRAMUSCULAR | Status: AC
  Administered 2013-02-24: 25 mg via INTRAVENOUS
  Filled 2013-02-24: qty 1

## 2013-02-24 MED ORDER — IOHEXOL 300 MG/ML  SOLN
50.0000 mL | Freq: Once | INTRAMUSCULAR | Status: AC | PRN
  Administered 2013-02-24: 50 mL via ORAL

## 2013-02-24 MED ORDER — INSULIN ASPART 100 UNIT/ML ~~LOC~~ SOLN: 5.0000 [IU] | Freq: Once | SUBCUTANEOUS | Status: DC

## 2013-02-24 MED ORDER — ONDANSETRON HCL 4 MG/2ML IJ SOLN
4.0000 mg | Freq: Once | INTRAMUSCULAR | Status: AC
  Administered 2013-02-24: 4 mg via INTRAVENOUS
  Filled 2013-02-24: qty 2

## 2013-02-24 MED ORDER — IOHEXOL 300 MG/ML  SOLN
100.0000 mL | Freq: Once | INTRAMUSCULAR | Status: AC | PRN
  Administered 2013-02-24: 100 mL via INTRAVENOUS

## 2013-02-24 NOTE — ED Provider Notes (Signed)
CSN: [id number]     Arrival date & time 02/24/13  1308 History   First MD Initiated Contact with Patient 02/24/13 [phone]     Chief Complaint  Patient presents with  . Emesis   (Consider location/radiation/quality/duration/timing/severity/associated sxs/prior Treatment) HPI  62 year old male with nausea and vomiting. Patient was recently seen in the emergency room for the same symptoms. He reports feeling better at discharge at return of symptoms shortly later. He is given a prescription for Phenergan but has not tried taking it. He denies any pain. No fevers or chills. He has a history of gastroparesis and states that current symptoms feel similar to what he calls previous exacerbations of his gastroparesis. No diarrhea. Last BM was yesterday. No sick contacts. Surgical history significant for cholecystectomy. No recent abdominal/pelvic procedures.  Past Medical History  Diagnosis Date  . Diabetes mellitus   . Hypertension   . CAD (coronary artery disease)   . 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 Gastroparesis   . 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 GERD (gastroesophageal reflux disease)   . Hyperlipidemia   . Peripheral neuropathy   . Sleep apnea    Past Surgical History  Procedure Laterality Date  . Cholecystectomy  01.2012  . Coronary stent placement  12 2010  . Left foot    . Ivc filter placement    . Charcoid joint surgery    . Left leg i&d    . Left leg rod    . Rt toe amputation    . Left shoulder arthroscopy     Family History  Problem Relation Age of Onset  . COPD Mother   . Heart disease Mother   . Lung cancer Mother    History  Substance Use Topics  . Smoking status: Never Smoker   . Smokeless tobacco: Never Used  . Alcohol Use: No    Review of Systems  All systems reviewed and negative, other than as noted in HPI.   Allergies  Codeine; Gabapentin; and Nabumetone  Home Medications   Current Outpatient Rx  Name   Route  Sig  Dispense  Refill  . aspirin 81 MG chewable tablet   Oral   Chew 81 mg by mouth daily.         . Clobetasol Prop Emollient Base 0.05 % emollient cream   Topical   Apply topically 2 (two) times daily.         . Dapagliflozin Propanediol (FARXIGA) 5 MG TABS   Oral   Take 5 mg by mouth daily.         . diphenhydramine-acetaminophen (EQ PAIN RELIEVER PM) 25-500 MG TABS   Oral   Take 2 tablets by mouth at bedtime.           . docusate sodium (COLACE) 100 MG capsule   Oral   Take 100 mg by mouth daily.         [name] doxycycline (DORYX) 100 MG DR capsule   Oral   Take 100 mg by mouth 2 (two) times daily.         . insulin detemir (LEVEMIR) 100 UNIT/ML injection   Subcutaneous   Inject 40-70 Units into the skin See admin instructions. 40 in am, 60 in pm         . insulin lispro (HUMALOG) 100 UNIT/ML injection   Subcutaneous   Inject into the skin 3 (three) times daily before meals. Sliding scale         .  Iron 66 MG TABS   Oral   Take 1 tablet by mouth daily.         . lansoprazole (PREVACID) 30 MG capsule   Oral   Take 30 mg by mouth 2 (two) times daily.           . metoCLOPramide (REGLAN) 5 MG tablet   Oral   Take 5 mg by mouth 2 (two) times daily.           . metoprolol succinate (TOPROL-XL) 25 MG 24 hr tablet      Once daily         . naproxen sodium (ANAPROX) 220 MG tablet   Oral   Take 220 mg by mouth 2 (two) times daily as needed.         . ondansetron (ZOFRAN) 4 MG tablet   Oral   Take 4 mg by mouth every 12 (twelve) hours as needed. For nausea         . pioglitazone (ACTOS) 30 MG tablet   Oral   Take 30 mg by mouth daily.          . pregabalin (LYRICA) 300 MG capsule   Oral   Take 300 mg by mouth 2 (two) times daily.          . promethazine (PHENERGAN) 25 MG tablet   Oral   Take 1 tablet (25 mg total) by mouth every 6 (six) hours as needed for nausea.   12 tablet   0   . quinapril (ACCUPRIL) 5 MG tablet    Oral   Take 5 mg by mouth daily.          . simvastatin (ZOCOR) 20 MG tablet   Oral   Take 40 mg by mouth 2 (two) times daily.          . sitaGLIPtan-metformin (JANUMET) 50-1000 MG per tablet   Oral   Take 2 tablets by mouth daily.          . sodium chloride 0.9 % SOLN 250 mL with ceftaroline 600 MG SOLR 600 mg   Intravenous   Inject 600 mg into the vein every 12 (twelve) hours. 6 weeks   600 mg   0    BP 148/80  Pulse 105  Temp(Src) 98 F (36.7 C) (Oral)  Resp 20  SpO2 97% Physical Exam  Nursing note and vitals reviewed. Constitutional: He appears well-developed and well-nourished.  Laying in bed. Somewhat uncomfortable appearing, but not toxic. Vomitus noted in bead.   HENT:  Head: Normocephalic and atraumatic.  Eyes: Conjunctivae are normal. Right eye exhibits no discharge. Left eye exhibits no discharge.  Neck: Neck supple.  Cardiovascular: Regular rhythm and normal heart sounds.  Exam reveals no gallop and no friction rub.   No murmur heard. Mild tachycardia  Pulmonary/Chest: Effort normal and breath sounds normal. No respiratory distress.  Abdominal: Soft. He exhibits no distension. There is no tenderness.  Obese abdomen limited examination. Does not seem distended. No tenderness.  Musculoskeletal: He exhibits no edema and no tenderness.  Neurological: He is alert.  Skin: Skin is warm and dry. He is not diaphoretic.  Psychiatric: He has a normal mood and affect. His behavior is normal. Thought content normal.    ED Course  Procedures (including critical care time) Labs Review Labs Reviewed  COMPREHENSIVE METABOLIC PANEL - Abnormal; Notable for the following:    Sodium 133 (*)    Glucose, Bld 242 (*)    Albumin 3.4 (*)  GFR calc non Af Amer 89 (*)    All other components within normal limits  CBC WITH DIFFERENTIAL - Abnormal; Notable for the following:    Neutrophils Relative % 80 (*)    All other components within normal limits  URINALYSIS,  ROUTINE W REFLEX MICROSCOPIC - Abnormal; Notable for the following:    Specific Gravity, Urine 1.036 (*)    Glucose, UA >1000 (*)    Ketones, ur >80 (*)    All other components within normal limits  GLUCOSE, CAPILLARY - Abnormal; Notable for the following:    Glucose-Capillary 190 (*)    All other components within normal limits  LIPASE, BLOOD  URINE MICROSCOPIC-ADD ON   Imaging Review Ct Abdomen W Contrast  02/24/2013   *RADIOLOGY REPORT*  Clinical Data: Vomiting, chronic nausea, gastroparesis.  Prior cholecystectomy.  CT ABDOMEN WITH CONTRAST  Technique:  Multidetector CT imaging of the abdomen was performed following the standard protocol during bolus administration of intravenous contrast.  Contrast: OMNIPAQUE IOHEXOL 300 MG/ML  SOLN  Comparison: 07/06/2010  Findings: Scattered areas of pulmonary scarring at the lung bases.  Coronary atherosclerosis.  Moderate hepatic steatosis.  Spleen and adrenal glands are within normal limits.  Fatty atrophy of the pancreas.  Status post cholecystectomy.  No intrahepatic or extrahepatic ductal dilatation.  5.4 x 4.2 cm left upper pole renal cyst.  Right kidney is unremarkable.  No hydronephrosis.  Visualized bowels unremarkable.  Atherosclerotic calcifications of the abdominal aorta and branch vessels.  No abdominal ascites.  No suspicious abdominal lymphadenopathy.  Gas within the midline anterior abdominal wall (series 2/image 55), likely reflecting a medication injection site.  Degenerative changes of the visualized thoracolumbar spine.  IMPRESSION: Moderate hepatic steatosis.  Prior cholecystectomy.  5.4 cm left upper pole renal cyst.  Please note that the pelvis was not imaged.   Original Report Authenticated By: Charline Bills, M.D.    MDM   1. Nausea and vomiting     62 year old male with persistent nausea and vomiting.  Second evaluation in the past day for the same complaints. Labs remained unremarkable aside from urinalysis which shows a  large amount of glucose and also ketones. No metabolic acidosis. Bicarbonate actually higher than last eval. Afebrile. Denies any pain. No abdominal or pelvic tenderness. CT was performed to evaluate for possible obstruction given his past surgical history. Unfortunately he did not receive imaging of his pelvis, but CT today did not show any evidence concerning for obstruction. Some subcutaneous air noted, likely from insulin injection. No concerning abdominal wall lesions noted. Patient was treated with IV fluids, a small dose of insulin and anti-emetics. He states that he feels better. He was given a prescription for Phenergan and his last emergency room visit.    Raeford Razor, MD 02/24/13 1323

## 2013-02-24 NOTE — ED Notes (Signed)
Bed: WU98 Expected date: 02/24/13 Expected time: 6:44 AM Means of arrival: Ambulance Comments: Vomiting

## 2013-02-24 NOTE — ED Provider Notes (Signed)
Medical screening examination/treatment/procedure(s) were performed by non-physician practitioner and as supervising physician I was immediately available for consultation/collaboration.   Shemiah Rosch Y. Turhan Chill, MD 02/24/13 1453 

## 2013-02-24 NOTE — ED Notes (Signed)
Pt presents with c/o nausea. Pt was seen here yesterday and d/c this morning around 1 am. Pt was diagnosed with gastroparesis and he does suffer from chronic nausea every day. Pt had a prescription for PO phenergan but is unable to take it because he was worried he was going to throw up. Pt was given 8 IV zofran en route by EMS. Vitals stable per EMS 140/90, 112 HR. CBG 215 hx of diabetes

## 2013-02-24 NOTE — Discharge Instructions (Signed)
Please follow up with your primary care physician in 1-2 days. Please take Zofran as needed for nausea. Please read all discharge instructions and return precautions.   Gastroparesis  Gastroparesis is also called slowed stomach emptying (delayed gastric emptying). It is a condition in which the stomach takes too long to empty its contents. It often happens in people with diabetes.  CAUSES  Gastroparesis happens when nerves to the stomach are damaged or stop working. When the nerves are damaged, the muscles of the stomach and intestines do not work normally. The movement of food is slowed or stopped. High blood glucose (sugar) causes changes in nerves and can damage the blood vessels that carry oxygen and nutrients to the nerves. RISK FACTORS  Diabetes.  Post-viral syndromes.  Eating disorders (anorexia, bulimia).  Surgery on the stomach or vagus nerve.  Gastroesophageal reflux disease (rarely).  Smooth muscle disorders (amyloidosis, scleroderma).  Metabolic disorders, including hypothyroidism.  Parkinson's disease. SYMPTOMS   Heartburn.  Feeling sick to your stomach (nausea).  Vomiting of undigested food.  An early feeling of fullness when eating.  Weight loss.  Abdominal bloating.  Erratic blood glucose levels.  Lack of appetite.  Gastroesophageal reflux.  Spasms of the stomach wall. Complications can include:  Bacterial overgrowth in stomach. Food stays in the stomach and can ferment and cause bacteria to grow.  Weight loss due to difficulty digesting and absorbing nutrients.  Vomiting.  Obstruction in the stomach. Undigested food can harden and cause nausea and vomiting.  Blood glucose fluctuations caused by inconsistent food absorption. DIAGNOSIS  The diagnosis of gastroparesis is confirmed through one or more of the following tests:  Barium X-rays and scans. These tests look at how long it takes for food to move through the stomach.  Gastric manometry.  This test measures electrical and muscular activity in the stomach. A thin tube is passed down the throat into the stomach. The tube contains a wire that takes measurements of the stomach's electrical and muscular activity as it digests liquids and solid food.  Endoscopy. This procedure is done with a long, thin tube called an endoscope. It is passed through the mouth and gently guides down the esophagus into the stomach. This tube helps the caregiver look at the lining of the stomach to check for any abnormalities.  Ultrasound. This can rule out gallbladder disease or pancreatitis. This test will outline and define the shape of the gallbladder and pancreas. TREATMENT   The primary treatment is to identify the problem and help control blood glucose levels. Treatments include:  Exercise.  Medicines to control nausea and vomiting.  Medicines to stimulate stomach muscles.  Changes in what and when you eat.  Having smaller meals more often.  Eating low-fiber forms of high-fiber foods, such aseating cooked vegetables instead of raw vegetables.  Eating low-fat foods.  Consuming liquids, which are easier to digest.  In severe cases, feeding tubes and intravenous (IV) feeding may be needed. It is important to note that in most cases, treatment does not cure gastroparesis. It is usually a lasting (chronic) condition. Treatment helps you manage the condition so that you can be as healthy and comfortable as possible. NEW TREATMENTS  A gastric neurostimulator has been developed to assist people with gastroparesis. The battery-operated device is surgically implanted. It emits mild electrical pulses to help improve stomach emptying and to control nausea and vomiting.  The use of botulinum toxin has been shown to improve stomach emptying by decreasing the prolonged contractions of the  muscle between the stomach and the small intestine (pyloric sphincter). The benefits are temporary. SEEK MEDICAL  CARE IF:   You are having problems keeping your blood glucose in goal range.  You are having nausea, vomiting, bloating, or early feelings of fullness with eating.  Your symptoms do not change with a change in diet. Document Released: 06/04/2005 Document Revised: 08/27/2011 Document Reviewed: 11/11/2008 [facility] Patient Information 2014 [place], [place].

## 2013-02-24 NOTE — ED Notes (Signed)
Patient is aware that we need him to provide a urine specimen. Urinal at bedside

## 2013-02-25 ENCOUNTER — Telehealth: Payer: Self-pay | Admitting: *Deleted

## 2013-02-25 NOTE — Telephone Encounter (Signed)
Pt states Dr. Victorino Dike would like him to see you before surgery is scheduled. He is following up with Dr. Victorino Dike on 03/23/13.  Would you like to see this patient again? If so, would you like to overbook him or put him in a new patient slot available 9/30 at 9:00? Please advise. Andree Coss, RN

## 2013-02-26 NOTE — Telephone Encounter (Signed)
Ok to schedule, he is a patient of Dr. Orvan Falconer.

## 2013-03-12 ENCOUNTER — Encounter: Payer: Self-pay | Admitting: Internal Medicine

## 2013-03-12 ENCOUNTER — Ambulatory Visit (INDEPENDENT_AMBULATORY_CARE_PROVIDER_SITE_OTHER): Payer: Medicare Other | Admitting: Internal Medicine

## 2013-03-12 VITALS — BP 153/84 | HR 94 | Temp 97.8°F | Ht 75.0 in | Wt 318.0 lb

## 2013-03-12 DIAGNOSIS — L97909 Non-pressure chronic ulcer of unspecified part of unspecified lower leg with unspecified severity: Secondary | ICD-10-CM

## 2013-03-12 DIAGNOSIS — E109 Type 1 diabetes mellitus without complications: Secondary | ICD-10-CM

## 2013-03-12 NOTE — Assessment & Plan Note (Signed)
Essentially has been nonhealing. I think with the oral antibiotics it has kept the infection at bay. However I do agree that he likely needs surgery for debridement and then IV antibiotics which he is willing to do at a nursing home. He has had MSSA in the past and I would continue with Ancef for 6 weeks after the surgery. We will see him in the hospital.

## 2013-03-12 NOTE — Assessment & Plan Note (Signed)
I did remind him of the need for much better sugar control.

## 2013-03-12 NOTE — Progress Notes (Signed)
  Subjective:    Patient ID: Samuel [name], male    DOB: [birth year], 61 y.o.   MRN: [id number]  HPI  Comes in for reevaluation of osteomyelitis. He has a history of type 1 diabetes and a history of Charcot foot and had debridement in May of 2012 and treated for MSSA osteomyelitis. He completed 6 weeks of therapy and had reassuring inflammatory markers that time however he tells me he developed more pain and inflammation after that time and an open wound on his left shin with drainage that has never really healed. Over the last year, he tells me he has been on multiple antibiotics. It has improved some and no further discharge. He did get a bone scan by him which does show some possible uptake in the region concerning for osteomyelitis. I did initially recommend IV antibiotics however he was unable to afford them. He therefore continued on clindamycin. He has had no discharge but continues to have the same underlying issue in his left leg. He also has problems with gastroparesis. He is going back to see orthopedics, Dr. Victorino Dike, next week.  He may consider debridement surgery at that time.     Review of Systems  Constitutional: Negative for fever and chills.  Respiratory: Negative for shortness of breath.   Cardiovascular: Positive for leg swelling.  Gastrointestinal: Positive for nausea and vomiting. Negative for diarrhea.  Musculoskeletal: Negative for joint swelling.  Skin: Negative for rash.  Neurological: Negative for dizziness, light-headedness and headaches.  Hematological: Negative for adenopathy.  Psychiatric/Behavioral: Negative for dysphoric mood.       Objective:   Physical Exam  Constitutional: He appears well-developed and well-nourished. No distress.  Eyes: No scleral icterus.  Cardiovascular: Normal rate, regular rhythm and normal heart sounds.   No murmur heard. Pulmonary/Chest: Effort normal and breath sounds normal. No respiratory distress.  Musculoskeletal: He exhibits  edema.  Neurological: He is alert.  Skin:  Shin with some redness but no warmth or pain.           Assessment & Plan:

## 2013-03-17 ENCOUNTER — Ambulatory Visit: Payer: Medicare Other | Admitting: Internal Medicine

## 2013-03-18 ENCOUNTER — Emergency Department (HOSPITAL_COMMUNITY)
Admission: EM | Admit: 2013-03-18 | Discharge: 2013-03-18 | Disposition: A | Discharge status: Home / Self Care | Payer: Medicare Other | Attending: Emergency Medicine | Admitting: Emergency Medicine

## 2013-03-18 ENCOUNTER — Emergency Department (HOSPITAL_COMMUNITY): Payer: Medicare Other

## 2013-03-18 ENCOUNTER — Encounter (HOSPITAL_COMMUNITY): Payer: Self-pay | Admitting: Emergency Medicine

## 2013-03-18 DIAGNOSIS — Y939 Activity, unspecified: Secondary | ICD-10-CM | POA: Insufficient documentation

## 2013-03-18 DIAGNOSIS — Z79899 Other long term (current) drug therapy: Secondary | ICD-10-CM | POA: Insufficient documentation

## 2013-03-18 DIAGNOSIS — Z7982 Long term (current) use of aspirin: Secondary | ICD-10-CM | POA: Insufficient documentation

## 2013-03-18 DIAGNOSIS — S335XXA Sprain of ligaments of lumbar spine, initial encounter: Secondary | ICD-10-CM | POA: Insufficient documentation

## 2013-03-18 DIAGNOSIS — Z8719 Personal history of other diseases of the digestive system: Secondary | ICD-10-CM | POA: Insufficient documentation

## 2013-03-18 DIAGNOSIS — R42 Dizziness and giddiness: Secondary | ICD-10-CM | POA: Insufficient documentation

## 2013-03-18 DIAGNOSIS — Z8669 Personal history of other diseases of the nervous system and sense organs: Secondary | ICD-10-CM | POA: Insufficient documentation

## 2013-03-18 DIAGNOSIS — S39012A Strain of muscle, fascia and tendon of lower back, initial encounter: Secondary | ICD-10-CM

## 2013-03-18 DIAGNOSIS — Z794 Long term (current) use of insulin: Secondary | ICD-10-CM | POA: Insufficient documentation

## 2013-03-18 DIAGNOSIS — Y92009 Unspecified place in unspecified non-institutional (private) residence as the place of occurrence of the external cause: Secondary | ICD-10-CM | POA: Insufficient documentation

## 2013-03-18 DIAGNOSIS — I2510 Atherosclerotic heart disease of native coronary artery without angina pectoris: Secondary | ICD-10-CM | POA: Insufficient documentation

## 2013-03-18 DIAGNOSIS — R209 Unspecified disturbances of skin sensation: Secondary | ICD-10-CM | POA: Insufficient documentation

## 2013-03-18 DIAGNOSIS — K219 Gastro-esophageal reflux disease without esophagitis: Secondary | ICD-10-CM | POA: Insufficient documentation

## 2013-03-18 DIAGNOSIS — E785 Hyperlipidemia, unspecified: Secondary | ICD-10-CM | POA: Insufficient documentation

## 2013-03-18 DIAGNOSIS — W19XXXA Unspecified fall, initial encounter: Secondary | ICD-10-CM

## 2013-03-18 DIAGNOSIS — W1809XA Striking against other object with subsequent fall, initial encounter: Secondary | ICD-10-CM | POA: Insufficient documentation

## 2013-03-18 DIAGNOSIS — E119 Type 2 diabetes mellitus without complications: Secondary | ICD-10-CM | POA: Insufficient documentation

## 2013-03-18 DIAGNOSIS — I10 Essential (primary) hypertension: Secondary | ICD-10-CM | POA: Insufficient documentation

## 2013-03-18 DIAGNOSIS — Z8739 Personal history of other diseases of the musculoskeletal system and connective tissue: Secondary | ICD-10-CM | POA: Insufficient documentation

## 2013-03-18 MED ORDER — OXYCODONE-ACETAMINOPHEN 5-325 MG PO TABS: 1.0000 | ORAL_TABLET | Freq: Four times a day (QID) | ORAL | Status: DC | PRN

## 2013-03-18 MED ORDER — SODIUM CHLORIDE 0.9 % IV SOLN
INTRAVENOUS | Status: DC
  Administered 2013-03-18: 14:00:00 via INTRAVENOUS

## 2013-03-18 MED ORDER — HYDROMORPHONE HCL PF 1 MG/ML IJ SOLN
1.0000 mg | Freq: Once | INTRAMUSCULAR | Status: AC
  Administered 2013-03-18: 1 mg via INTRAVENOUS
  Filled 2013-03-18: qty 1

## 2013-03-18 MED ORDER — KETOROLAC TROMETHAMINE 30 MG/ML IJ SOLN
30.0000 mg | Freq: Once | INTRAMUSCULAR | Status: AC
  Administered 2013-03-18: 30 mg via INTRAVENOUS
  Filled 2013-03-18: qty 1

## 2013-03-18 MED ORDER — HYDROMORPHONE HCL PF 1 MG/ML IJ SOLN
1.0000 mg | Freq: Once | INTRAMUSCULAR | Status: AC
  Administered 2013-03-18: 1 mg via INTRAMUSCULAR
  Filled 2013-03-18: qty 1

## 2013-03-18 NOTE — ED Provider Notes (Signed)
CSN: [id number]     Arrival date & time 03/18/13  1119 History   First MD Initiated Contact with Patient 03/18/13 1134     Chief Complaint  Patient presents with  . Fall   (Consider location/radiation/quality/duration/timing/severity/associated sxs/prior Treatment) Patient is a 62 y.o. male presenting with fall. The history is provided by the patient.  Fall This is a recurrent problem. Pertinent negatives include no chest pain, no abdominal pain and no shortness of breath.   patient presents after a fall. Patient states he got out of bed too quickly and then felt lightheaded and fell down. He hit his back on a bedside table. He has pain in his lower mid back. He states it hurts to move around. He has chronic numbness in his feet. Is unchanged. No loss consciousness. No headache. No chest pain. No neck pain. No abdominal pain. Patient states is typical for him to get lightheaded when he attempts to stand up. Past Medical History  Diagnosis Date  . Diabetes mellitus   . Hypertension   . CAD (coronary artery disease)   . 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 Gastroparesis   . 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 GERD (gastroesophageal reflux disease)   . Hyperlipidemia   . Peripheral neuropathy   . Sleep apnea    Past Surgical History  Procedure Laterality Date  . Cholecystectomy  01.2012  . Coronary stent placement  12 2010  . Left foot    . Ivc filter placement    . Charcoid joint surgery    . Left leg i&d    . Left leg rod    . Rt toe amputation    . Left shoulder arthroscopy     Family History  Problem Relation Age of Onset  . COPD Mother   . Heart disease Mother   . Lung cancer Mother    History  Substance Use Topics  . Smoking status: Never Smoker   . Smokeless tobacco: Never Used  . Alcohol Use: No    Review of Systems  Constitutional: Negative for appetite change and fatigue.  Eyes: Negative for pain.  Respiratory: Negative  for chest tightness and shortness of breath.   Cardiovascular: Negative for chest pain.  Gastrointestinal: Negative for abdominal pain.  Genitourinary: Negative for dysuria.  Musculoskeletal: Positive for back pain.  Skin: Positive for wound.  Neurological: Positive for light-headedness and numbness. Negative for weakness.    Allergies  Codeine; Gabapentin; and Nabumetone  Home Medications   Current Outpatient Rx  Name  Route  Sig  Dispense  Refill  . aspirin 81 MG chewable tablet   Oral   Chew 81 mg by mouth daily.         . Clobetasol Prop Emollient Base 0.05 % emollient cream   Topical   Apply topically 2 (two) times daily.         . Dapagliflozin Propanediol (FARXIGA) 5 MG TABS   Oral   Take 5 mg by mouth daily.         . diphenhydramine-acetaminophen (EQ PAIN RELIEVER PM) 25-500 MG TABS   Oral   Take 2 tablets by mouth at bedtime.           . docusate sodium (COLACE) 100 MG capsule   Oral   Take 100 mg by mouth daily.         . insulin detemir (LEVEMIR) 100 UNIT/ML injection   Subcutaneous   Inject 40-70  Units into the skin See admin instructions. 40 in am, 60 in pm         . insulin lispro (HUMALOG) 100 UNIT/ML injection   Subcutaneous   Inject into the skin 3 (three) times daily before meals. Sliding scale         . Iron 66 MG TABS   Oral   Take 1 tablet by mouth daily.         . lansoprazole (PREVACID) 30 MG capsule   Oral   Take 30 mg by mouth 2 (two) times daily.           . metoCLOPramide (REGLAN) 5 MG tablet   Oral   Take 5 mg by mouth 2 (two) times daily.           . metoprolol succinate (TOPROL-XL) 25 MG 24 hr tablet   Oral   Take by mouth daily. Once daily         . naproxen sodium (ANAPROX) 220 MG tablet   Oral   Take 220 mg by mouth 2 (two) times daily as needed.         . pioglitazone (ACTOS) 30 MG tablet   Oral   Take 30 mg by mouth daily.          . pregabalin (LYRICA) 300 MG capsule   Oral   Take 300  mg by mouth 2 (two) times daily.          . promethazine (PHENERGAN) 25 MG tablet   Oral   Take 1 tablet (25 mg total) by mouth every 6 (six) hours as needed for nausea.   12 tablet   0   . quinapril (ACCUPRIL) 5 MG tablet   Oral   Take 5 mg by mouth daily.          . simvastatin (ZOCOR) 20 MG tablet   Oral   Take 40 mg by mouth 2 (two) times daily.          [name] oxyCODONE-acetaminophen (PERCOCET/ROXICET) 5-325 MG per tablet   Oral   Take 1-2 tablets by mouth every 6 (six) hours as needed for pain.   20 tablet   0   . sitaGLIPtan-metformin (JANUMET) 50-1000 MG per tablet   Oral   Take 2 tablets by mouth daily.           BP 133/70  Pulse 89  Temp(Src) 97.5 F (36.4 C) (Oral)  Resp 19  Ht 6\' 3"  (1.905 m)  Wt 315 lb (142.883 kg)  BMI 39.37 kg/m2  SpO2 95% Physical Exam  Nursing note and vitals reviewed. Constitutional: He is oriented to person, place, and time. He appears well-developed and well-nourished.  Patient is obese  HENT:  Head: Normocephalic and atraumatic.  Eyes: EOM are normal. Pupils are equal, round, and reactive to light.  Neck: Normal range of motion. Neck supple.  Cardiovascular: Normal rate, regular rhythm and normal heart sounds.   No murmur heard. Pulmonary/Chest: Effort normal and breath sounds normal.  Abdominal: Soft. Bowel sounds are normal. He exhibits no distension and no mass. There is no tenderness. There is no rebound and no guarding.  Musculoskeletal: Normal range of motion. He exhibits tenderness. He exhibits no edema.  Lower thoracic and lumbar tenderness. No step-off or deformity. Chronic skin changes and wounds to bilateral lower legs. Numbness to bilateral lower legs. Chronic per patient. Some pain to back with left-sided straight leg raise.  Neurological: He is alert and oriented to person, place, and  time. No cranial nerve deficit.  Skin: Skin is warm and dry.  Psychiatric: He has a normal mood and affect.    ED Course   Procedures (including critical care time) Labs Review Labs Reviewed - No data to display Imaging Review Dg Thoracic Spine 2 View  03/18/2013   CLINICAL DATA:  Fall, upper back pain  EXAM: THORACIC SPINE - 2 VIEW  COMPARISON:  CTA chest dated 03/13/2011  FINDINGS: Normal thoracic kyphosis.  No evidence of fracture or dislocation. Vertebral body heights are maintained.  Mild to moderate multilevel degenerative changes.  Visualized lungs are clear.  Cholecystectomy clips.  IMPRESSION: No fracture or dislocation is seen.  Mild to moderate degenerative changes.   Electronically Signed   By: Charline Bills M.D.   On: 03/18/2013 13:14   Dg Lumbar Spine Complete  03/18/2013   CLINICAL DATA:  Fall, low back pain  EXAM: LUMBAR SPINE - COMPLETE 4+ VIEW  COMPARISON:  CT abdomen pelvis dated 02/24/2013  FINDINGS: Five lumbar type vertebral bodies.  Normal lumbar lordosis.  No evidence of fracture or dislocation. Vertebral body heights are maintained.  Mild multilevel degenerative changes.  Visualized bony pelvis appears intact.  Cholecystectomy clips.  IMPRESSION: No fracture or dislocation is seen.  Mild degenerative changes.   Electronically Signed   By: Charline Bills M.D.   On: 03/18/2013 13:14    MDM   1. Lumbar strain, initial encounter   2. Fall, initial encounter    Patient with fall and back pain. X-ray negative. Patient has multiple medical problems but appears to at his baseline except for the back pain. Will be discharged home with pain medicines to follow with his PCP    Juliet Rude. Rubin Payor, MD 03/18/13 [phone]

## 2013-03-18 NOTE — ED Notes (Signed)
Pt c/o mid upper back pain upon touch.

## 2013-03-18 NOTE — ED Notes (Signed)
Per EMS: Pt got up to fast and states he got dizzy, pt states that's normal if he gets up too fast, hit his mid upper back on a night stand. C/o pain in back.

## 2013-03-20 ENCOUNTER — Emergency Department (HOSPITAL_COMMUNITY)
Admission: EM | Admit: 2013-03-20 | Discharge: 2013-03-20 | Disposition: A | Discharge status: Home / Self Care | Payer: Medicare Other | Attending: Emergency Medicine | Admitting: Emergency Medicine

## 2013-03-20 ENCOUNTER — Encounter (HOSPITAL_COMMUNITY): Payer: Self-pay | Admitting: Emergency Medicine

## 2013-03-20 DIAGNOSIS — I10 Essential (primary) hypertension: Secondary | ICD-10-CM | POA: Insufficient documentation

## 2013-03-20 DIAGNOSIS — Z9861 Coronary angioplasty status: Secondary | ICD-10-CM | POA: Insufficient documentation

## 2013-03-20 DIAGNOSIS — E1349 Other specified diabetes mellitus with other diabetic neurological complication: Secondary | ICD-10-CM | POA: Insufficient documentation

## 2013-03-20 DIAGNOSIS — K219 Gastro-esophageal reflux disease without esophagitis: Secondary | ICD-10-CM | POA: Insufficient documentation

## 2013-03-20 DIAGNOSIS — Z7982 Long term (current) use of aspirin: Secondary | ICD-10-CM | POA: Insufficient documentation

## 2013-03-20 DIAGNOSIS — I2510 Atherosclerotic heart disease of native coronary artery without angina pectoris: Secondary | ICD-10-CM | POA: Insufficient documentation

## 2013-03-20 DIAGNOSIS — Z794 Long term (current) use of insulin: Secondary | ICD-10-CM | POA: Insufficient documentation

## 2013-03-20 DIAGNOSIS — R110 Nausea: Secondary | ICD-10-CM | POA: Insufficient documentation

## 2013-03-20 DIAGNOSIS — R5381 Other malaise: Secondary | ICD-10-CM | POA: Insufficient documentation

## 2013-03-20 DIAGNOSIS — E785 Hyperlipidemia, unspecified: Secondary | ICD-10-CM | POA: Insufficient documentation

## 2013-03-20 DIAGNOSIS — R531 Weakness: Secondary | ICD-10-CM | POA: Diagnosis present

## 2013-03-20 DIAGNOSIS — M549 Dorsalgia, unspecified: Secondary | ICD-10-CM | POA: Diagnosis present

## 2013-03-20 DIAGNOSIS — Z79899 Other long term (current) drug therapy: Secondary | ICD-10-CM | POA: Insufficient documentation

## 2013-03-20 LAB — BASIC METABOLIC PANEL
CO2: 24 mEq/L (ref 19–32)
Calcium: 9.4 mg/dL (ref 8.4–10.5)
Creatinine, Ser: 0.98 mg/dL (ref 0.50–1.35)
GFR calc Af Amer: >90 mL/min (ref >90)
GFR calc non Af Amer: 88 mL/min — ABNORMAL LOW (ref >90)
Sodium: 137 mEq/L (ref 135–145)

## 2013-03-20 LAB — CBC WITH DIFFERENTIAL/PLATELET
Basophils Absolute: 0 10*3/uL (ref 0.0–0.1)
Basophils Relative: 0 % (ref 0–1)
Eosinophils Absolute: 0.2 10*3/uL (ref 0.0–0.7)
Eosinophils Relative: 3 % (ref 0–5)
Lymphocytes Relative: 29 % (ref 12–46)
MCHC: 34 g/dL (ref 30.0–36.0)
MCV: 92.4 fL (ref 78.0–100.0)
Platelets: 184 10*3/uL (ref 150–400)
RDW: 13.4 % (ref 11.5–15.5)
WBC: 5.2 10*3/uL (ref 4.0–10.5)

## 2013-03-20 LAB — URINALYSIS, ROUTINE W REFLEX MICROSCOPIC
Ketones, ur: 15 mg/dL — AB
Leukocytes, UA: NEGATIVE
Protein, ur: NEGATIVE mg/dL
Urobilinogen, UA: 1 mg/dL (ref 0.0–1.0)

## 2013-03-20 LAB — GLUCOSE, CAPILLARY: Glucose-Capillary: 157 mg/dL — ABNORMAL HIGH (ref 70–99)

## 2013-03-20 MED ORDER — HYDROMORPHONE HCL PF 1 MG/ML IJ SOLN
0.5000 mg | Freq: Once | INTRAMUSCULAR | Status: AC
  Administered 2013-03-20: 0.5 mg via INTRAVENOUS
  Filled 2013-03-20: qty 1

## 2013-03-20 MED ORDER — PROMETHAZINE HCL 25 MG/ML IJ SOLN
12.5000 mg | Freq: Once | INTRAMUSCULAR | Status: AC
  Administered 2013-03-20: 12.5 mg via INTRAVENOUS
  Filled 2013-03-20: qty 1

## 2013-03-20 MED ORDER — SODIUM CHLORIDE 0.9 % IV BOLUS (SEPSIS)
500.0000 mL | Freq: Once | INTRAVENOUS | Status: AC
  Administered 2013-03-20: 500 mL via INTRAVENOUS

## 2013-03-20 MED ORDER — PROMETHAZINE HCL 25 MG PO TABS: 25.0000 mg | ORAL_TABLET | Freq: Four times a day (QID) | ORAL | Status: DC | PRN

## 2013-03-20 MED ORDER — HYDROCODONE-ACETAMINOPHEN 5-325 MG PO TABS: 1.0000 | ORAL_TABLET | Freq: Four times a day (QID) | ORAL | Status: DC | PRN

## 2013-03-20 NOTE — ED Notes (Signed)
Spoke with case management, need to hear from walker supplier prior to discharge

## 2013-03-20 NOTE — ED Notes (Signed)
Per EMs. Pt from home. Fell 2 days ago, was seen for injuries at ED. Was discharged. Per EMS pt has not tried getting up or walking since fall. Complains of weakness since fall. Was able to walk with assist with ems. Pt complains of lower back pain since fall, also complains of nausea. 4mg  zofran given IM by ems.

## 2013-03-20 NOTE — ED Notes (Signed)
Ambulated pt with a  Walker patient did well, notified   Doctor Romeo Apple

## 2013-03-20 NOTE — ED Notes (Signed)
Bed: WA24 Expected date:  Expected time:  Means of arrival:  Comments: EMS-weakness 

## 2013-03-20 NOTE — Progress Notes (Addendum)
Pt's brother arrived to pick him up Pt wants RW delivered Cm left a voice message for Darral Dash to have RW delivered to the home Verified the home address and contact number.  Pt provided with list of private duty nursing, [facility] brochure and [facility] brochure Left [facility] coordinator a message about hhpt and aide order. Baxter Hire returned call orders received  [phone] Pt brother informed Cm pt has 3 options of borrowed adjustable walkers at home and does not need one delivered Pt agreed Cm cancelled order for RW

## 2013-03-20 NOTE — ED Provider Notes (Signed)
CSN: [id number]     Arrival date & time 03/20/13  0827 History   First MD Initiated Contact with Patient 03/20/13 [phone]     Chief Complaint  Patient presents with  . Back Pain  . Fatigue  . Nausea   (Consider location/radiation/quality/duration/timing/severity/associated sxs/prior Treatment) Patient is a 62 y.o. male presenting with back pain. The history is provided by the patient.  Back Pain Pain location: left parspinal upper lumbar and lower thoracic spinal area. Quality:  Aching Radiates to:  Does not radiate Pain severity:  Moderate Pain is:  Same all the time Onset quality:  Gradual Duration:  2 days Timing:  Constant Progression:  Partially resolved Chronicity:  New Context comment:  Recent fall Relieved by:  Nothing Worsened by:  Nothing tried Ineffective treatments:  Bed rest (percocet) Associated symptoms: no abdominal pain, no chest pain, no dysuria, no fever, no headaches and no numbness     Past Medical History  Diagnosis Date  . Diabetes mellitus   . Hypertension   . CAD (coronary artery disease)   . 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 Gastroparesis   . 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 GERD (gastroesophageal reflux disease)   . Hyperlipidemia   . Peripheral neuropathy   . Sleep apnea    Past Surgical History  Procedure Laterality Date  . Cholecystectomy  01.2012  . Coronary stent placement  12 2010  . Left foot    . Ivc filter placement    . Charcoid joint surgery    . Left leg i&d    . Left leg rod    . Rt toe amputation    . Left shoulder arthroscopy     Family History  Problem Relation Age of Onset  . COPD Mother   . Heart disease Mother   . Lung cancer Mother    History  Substance Use Topics  . Smoking status: Never Smoker   . Smokeless tobacco: Never Used  . Alcohol Use: No    Review of Systems  Constitutional: Negative for fever.  HENT: Negative for rhinorrhea, drooling and neck pain.    Eyes: Negative for pain.  Respiratory: Negative for cough and shortness of breath.   Cardiovascular: Negative for chest pain and leg swelling.  Gastrointestinal: Negative for nausea, vomiting, abdominal pain and diarrhea.  Genitourinary: Negative for dysuria and hematuria.  Musculoskeletal: Positive for back pain. Negative for gait problem.  Skin: Negative for color change.  Neurological: Negative for numbness and headaches.  Hematological: Negative for adenopathy.  Psychiatric/Behavioral: Negative for behavioral problems.  All other systems reviewed and are negative.    Allergies  Codeine; Gabapentin; and Nabumetone  Home Medications   Current Outpatient Rx  Name  Route  Sig  Dispense  Refill  . aspirin 81 MG chewable tablet   Oral   Chew 81 mg by mouth daily.         . Clobetasol Prop Emollient Base 0.05 % emollient cream   Topical   Apply topically 2 (two) times daily.         . Dapagliflozin Propanediol (FARXIGA) 5 MG TABS   Oral   Take 5 mg by mouth daily.         . diphenhydramine-acetaminophen (EQ PAIN RELIEVER PM) 25-500 MG TABS   Oral   Take 2 tablets by mouth at bedtime.           . docusate sodium (COLACE) 100 MG capsule  Oral   Take 100 mg by mouth daily.         . insulin detemir (LEVEMIR) 100 UNIT/ML injection   Subcutaneous   Inject 40-70 Units into the skin See admin instructions. 40 in am, 60 in pm         . insulin lispro (HUMALOG) 100 UNIT/ML injection   Subcutaneous   Inject into the skin 3 (three) times daily before meals. Sliding scale         . Iron 66 MG TABS   Oral   Take 1 tablet by mouth daily.         . lansoprazole (PREVACID) 30 MG capsule   Oral   Take 30 mg by mouth 2 (two) times daily.           . metoCLOPramide (REGLAN) 5 MG tablet   Oral   Take 5 mg by mouth 2 (two) times daily.           . metoprolol succinate (TOPROL-XL) 25 MG 24 hr tablet   Oral   Take by mouth daily. Once daily         .  naproxen sodium (ANAPROX) 220 MG tablet   Oral   Take 220 mg by mouth 2 (two) times daily as needed.         [name] oxyCODONE-acetaminophen (PERCOCET/ROXICET) 5-325 MG per tablet   Oral   Take 1-2 tablets by mouth every 6 (six) hours as needed for pain.   20 tablet   0   . pioglitazone (ACTOS) 30 MG tablet   Oral   Take 30 mg by mouth daily.          . pregabalin (LYRICA) 300 MG capsule   Oral   Take 300 mg by mouth 2 (two) times daily.          . promethazine (PHENERGAN) 25 MG tablet   Oral   Take 1 tablet (25 mg total) by mouth every 6 (six) hours as needed for nausea.   12 tablet   0   . quinapril (ACCUPRIL) 5 MG tablet   Oral   Take 5 mg by mouth daily.          . simvastatin (ZOCOR) 20 MG tablet   Oral   Take 40 mg by mouth 2 (two) times daily.          . sitaGLIPtan-metformin (JANUMET) 50-1000 MG per tablet   Oral   Take 2 tablets by mouth daily.           There were no vitals taken for this visit. Physical Exam  Nursing note and vitals reviewed. Constitutional: He is oriented to person, place, and time. He appears well-developed and well-nourished.  HENT:  Head: Normocephalic and atraumatic.  Right Ear: External ear normal.  Left Ear: External ear normal.  Nose: Nose normal.  Mouth/Throat: Oropharynx is clear and moist. No oropharyngeal exudate.  Eyes: Conjunctivae and EOM are normal. Pupils are equal, round, and reactive to light.  Neck: Normal range of motion. Neck supple.  No focal vertebral ttp.   Cardiovascular: Normal rate, regular rhythm, normal heart sounds and intact distal pulses.  Exam reveals no gallop and no friction rub.   No murmur heard. Pulmonary/Chest: Effort normal and breath sounds normal. No respiratory distress. He has no wheezes.  Abdominal: Soft. Bowel sounds are normal. He exhibits no distension. There is no tenderness. There is no rebound and no guarding.  Musculoskeletal: Normal range of motion. He exhibits edema (  mild  pitting edema extending to mid shin bilaterally. ) and tenderness (Mild old ecchymosis noted in left upper lumbar/lower thoracic paraspinal area. Mild to mod ttp in this area. ).  Neurological: He is alert and oriented to person, place, and time.  Mild dec sensation in distal LE's which is unchanged from baseline.   2/5 strength in LE's d/t back pain w/ elevation.  5/5 strength and normal strength in UE's.   Skin: Skin is warm and dry.  Several old excoriated lesions on bilateral anterior shins. Several small bullae noted on right lower anterior shin.   Old dark eschar at amputation site where the 1st digit of the right foot was previously located.   Psychiatric: He has a normal mood and affect. His behavior is normal.    ED Course  Procedures (including critical care time) Labs Review Labs Reviewed - No data to display Imaging Review Dg Thoracic Spine 2 View  03/18/2013   CLINICAL DATA:  Fall, upper back pain  EXAM: THORACIC SPINE - 2 VIEW  COMPARISON:  CTA chest dated 03/13/2011  FINDINGS: Normal thoracic kyphosis.  No evidence of fracture or dislocation. Vertebral body heights are maintained.  Mild to moderate multilevel degenerative changes.  Visualized lungs are clear.  Cholecystectomy clips.  IMPRESSION: No fracture or dislocation is seen.  Mild to moderate degenerative changes.   Electronically Signed   By: Charline Bills M.D.   On: 03/18/2013 13:14   Dg Lumbar Spine Complete  03/18/2013   CLINICAL DATA:  Fall, low back pain  EXAM: LUMBAR SPINE - COMPLETE 4+ VIEW  COMPARISON:  CT abdomen pelvis dated 02/24/2013  FINDINGS: Five lumbar type vertebral bodies.  Normal lumbar lordosis.  No evidence of fracture or dislocation. Vertebral body heights are maintained.  Mild multilevel degenerative changes.  Visualized bony pelvis appears intact.  Cholecystectomy clips.  IMPRESSION: No fracture or dislocation is seen.  Mild degenerative changes.   Electronically Signed   By: Charline Bills  M.D.   On: 03/18/2013 13:14     Date: 03/20/2013  Rate: 81  Rhythm: normal sinus rhythm  QRS Axis: normal  Intervals: normal  ST/T Wave abnormalities: normal  Conduction Disutrbances:none  Narrative Interpretation: No ST or T wave changes consistent with ischemia.    Old EKG Reviewed: unchanged   MDM   1. Back pain   2. Generalized weakness    8:38 AM 62 y.o. male with back pain and generalized weakness which began yesterday evening. The patient was seen here 2 days ago for a mechanical fall from standing where he hit his back. Plain film imaging was negative. He notes that the Percocet has mildly helped but not completely controlled his pain. He is afebrile and vital signs are unremarkable here today. He does note some nausea overnight with retching but no emesis or diarrhea. Back pain is paraspinal, no midline ttp.  He denies any chest pain, shortness of breath , or abdominal pain. Will get screening lab work, EKG, small bolus, nausea control, and pain control.  11:18 AM: I interpreted/reviewed the labs and/or imaging which were non-contributory.  Pt feeling better after pain medicine. Is ambulatory w/ a walker which I will order for him at home. SW has arranged a face to face and PT/Health care aid for home. Pt requesting hydrocodone instead of oxycodone for pain. Will provide a small amt of vicodin.  I have discussed the diagnosis/risks/treatment options with the patient and brother and believe the pt to be eligible for discharge  home to follow-up with pcp next week. We also discussed returning to the ED immediately if new or worsening sx occur. We discussed the sx which are most concerning (e.g., worsening back pain, fever) that necessitate immediate return. Any new prescriptions provided to the patient are listed below.     Junius Argyle, MD 03/20/13 [phone]

## 2013-03-20 NOTE — Progress Notes (Addendum)
PCP [name] EPIC updated Pt need walker ht 6'3" wt 315-317 lbs Cm spoke with pt and called in rw to advanced home [facility] choice offered see below [facility] if can be delivered to ED within next few 1-2 hours if not delivery at home.  Lived with brother for 13 yrs until he passed in 2013 Now lives alone Has another brother and niece locally who can visit him and open his door. Pt reports still being able to drive until recently when he fell and failed eye exam x 2 for DMV recently obtained new glasses and will be re taking eye exam  Cm spoke with Lucretia to place RW order Estimated wait time 1 hour.  Pt & ED RN updated  HOME HEALTH AGENCIES SERVING [place]   Agencies that are Medicare-Certified and are affiliated with The [facility] Home Health Agency  Telephone Number Address  [facility]   The [facility] has ownership interest in this company; however, you are under no obligation to use this agency. [phone] or  [phone] [address] [place], [place] [address] [email or web address]   Agencies that are Medicare-Certified and are not affiliated with [facility] Agency Telephone Number Address  [facility] [phone] Fax [phone] [address] [place], [place]  [address] [email or web address]  [facility] [phone] or [phone] Fax [phone] [address] [place], [place] [address] [email or web address]  [facility] [phone] Fax [phone] [address] [place], [place] [address] [email or web address]  [facility] [phone] Fax [phone] [address] [place], [place]  [address] [email or web address]  [facility] [phone] Fax  [phone] [address] [place], [place] [address] [email or web address]  [facility] [phone] [address] [place], [place] [address] [email or web address]  [facility] [phone]  [address] [place], [place] [address] [email or web address]  [facility] [phone] or [phone] Fax number [phone] [address], [address] [place], [place]  [address] [email or web address]  [facility] [phone] Fax [phone] [address] [place], [place]  [address]  [facility]  [phone] Fax [phone] [address] [place], [place] [address] [email or web address]

## 2013-04-06 ENCOUNTER — Encounter (HOSPITAL_BASED_OUTPATIENT_CLINIC_OR_DEPARTMENT_OTHER): Payer: Medicare Other

## 2013-04-15 DIAGNOSIS — I878 Other specified disorders of veins: Secondary | ICD-10-CM | POA: Insufficient documentation

## 2013-04-30 ENCOUNTER — Encounter (HOSPITAL_COMMUNITY): Payer: Self-pay | Admitting: Emergency Medicine

## 2013-04-30 ENCOUNTER — Emergency Department (HOSPITAL_COMMUNITY)
Admission: EM | Admit: 2013-04-30 | Discharge: 2013-04-30 | Disposition: A | Discharge status: Home / Self Care | Payer: Medicare Other | Attending: Emergency Medicine | Admitting: Emergency Medicine

## 2013-04-30 DIAGNOSIS — E785 Hyperlipidemia, unspecified: Secondary | ICD-10-CM | POA: Insufficient documentation

## 2013-04-30 DIAGNOSIS — I2510 Atherosclerotic heart disease of native coronary artery without angina pectoris: Secondary | ICD-10-CM | POA: Insufficient documentation

## 2013-04-30 DIAGNOSIS — Z7982 Long term (current) use of aspirin: Secondary | ICD-10-CM | POA: Insufficient documentation

## 2013-04-30 DIAGNOSIS — K219 Gastro-esophageal reflux disease without esophagitis: Secondary | ICD-10-CM | POA: Insufficient documentation

## 2013-04-30 DIAGNOSIS — Z79899 Other long term (current) drug therapy: Secondary | ICD-10-CM | POA: Insufficient documentation

## 2013-04-30 DIAGNOSIS — E1349 Other specified diabetes mellitus with other diabetic neurological complication: Secondary | ICD-10-CM | POA: Insufficient documentation

## 2013-04-30 DIAGNOSIS — Z9861 Coronary angioplasty status: Secondary | ICD-10-CM | POA: Insufficient documentation

## 2013-04-30 DIAGNOSIS — K3184 Gastroparesis: Secondary | ICD-10-CM | POA: Insufficient documentation

## 2013-04-30 DIAGNOSIS — I10 Essential (primary) hypertension: Secondary | ICD-10-CM | POA: Insufficient documentation

## 2013-04-30 DIAGNOSIS — Z794 Long term (current) use of insulin: Secondary | ICD-10-CM | POA: Insufficient documentation

## 2013-04-30 DIAGNOSIS — G4730 Sleep apnea, unspecified: Secondary | ICD-10-CM | POA: Insufficient documentation

## 2013-04-30 DIAGNOSIS — G609 Hereditary and idiopathic neuropathy, unspecified: Secondary | ICD-10-CM | POA: Insufficient documentation

## 2013-04-30 DIAGNOSIS — R0682 Tachypnea, not elsewhere classified: Secondary | ICD-10-CM | POA: Insufficient documentation

## 2013-04-30 DIAGNOSIS — R112 Nausea with vomiting, unspecified: Secondary | ICD-10-CM | POA: Insufficient documentation

## 2013-04-30 LAB — URINALYSIS, ROUTINE W REFLEX MICROSCOPIC
Bilirubin Urine: NEGATIVE
Hgb urine dipstick: NEGATIVE
Ketones, ur: >80 mg/dL — AB
Leukocytes, UA: NEGATIVE
Nitrite: NEGATIVE
Protein, ur: NEGATIVE mg/dL
pH: 6 (ref 5.0–8.0)

## 2013-04-30 LAB — COMPREHENSIVE METABOLIC PANEL
ALT: 27 U/L (ref 0–53)
Alkaline Phosphatase: 64 U/L (ref 39–117)
BUN: 14 mg/dL (ref 6–23)
CO2: 23 mEq/L (ref 19–32)
Chloride: 98 mEq/L (ref 96–112)
GFR calc Af Amer: >90 mL/min (ref >90)
Glucose, Bld: 194 mg/dL — ABNORMAL HIGH (ref 70–99)
Potassium: 4.4 mEq/L (ref 3.5–5.1)
Sodium: 138 mEq/L (ref 135–145)
Total Bilirubin: 1 mg/dL (ref 0.3–1.2)
Total Protein: 8 g/dL (ref 6.0–8.3)

## 2013-04-30 LAB — CBC WITH DIFFERENTIAL/PLATELET
Basophils Relative: 0 % (ref 0–1)
Eosinophils Relative: 1 % (ref 0–5)
HCT: 41.6 % (ref 39.0–52.0)
Hemoglobin: 14.2 g/dL (ref 13.0–17.0)
Lymphocytes Relative: 16 % (ref 12–46)
MCHC: 34.1 g/dL (ref 30.0–36.0)
MCV: 92.7 fL (ref 78.0–100.0)
Monocytes Absolute: 0.5 10*3/uL (ref 0.1–1.0)
Monocytes Relative: 7 % (ref 3–12)
Neutro Abs: 5.6 10*3/uL (ref 1.7–7.7)
RBC: 4.49 MIL/uL (ref 4.22–5.81)
WBC: 7.3 10*3/uL (ref 4.0–10.5)

## 2013-04-30 LAB — URINE MICROSCOPIC-ADD ON

## 2013-04-30 MED ORDER — LORAZEPAM 2 MG/ML IJ SOLN
1.0000 mg | Freq: Once | INTRAMUSCULAR | Status: AC
  Administered 2013-04-30: 1 mg via INTRAMUSCULAR
  Filled 2013-04-30: qty 1

## 2013-04-30 MED ORDER — SODIUM CHLORIDE 0.9 % IV SOLN: 1000.0000 mL | INTRAVENOUS | Status: DC

## 2013-04-30 MED ORDER — PROMETHAZINE HCL 25 MG/ML IJ SOLN
25.0000 mg | Freq: Once | INTRAMUSCULAR | Status: AC
  Administered 2013-04-30: 25 mg via INTRAMUSCULAR

## 2013-04-30 MED ORDER — METOCLOPRAMIDE HCL 5 MG/ML IJ SOLN
10.0000 mg | Freq: Once | INTRAMUSCULAR | Status: AC
  Administered 2013-04-30: 10 mg via INTRAVENOUS
  Filled 2013-04-30: qty 2

## 2013-04-30 MED ORDER — SODIUM CHLORIDE 0.9 % IV SOLN
1000.0000 mL | Freq: Once | INTRAVENOUS | Status: AC
  Administered 2013-04-30: 1000 mL via INTRAVENOUS

## 2013-04-30 MED ORDER — DIPHENHYDRAMINE HCL 50 MG/ML IJ SOLN
25.0000 mg | Freq: Once | INTRAMUSCULAR | Status: AC
  Administered 2013-04-30: 25 mg via INTRAVENOUS
  Filled 2013-04-30: qty 1

## 2013-04-30 MED ORDER — PROMETHAZINE HCL 25 MG/ML IJ SOLN
12.5000 mg | Freq: Once | INTRAMUSCULAR | Status: DC
  Filled 2013-04-30: qty 1

## 2013-04-30 MED ORDER — PROMETHAZINE HCL 25 MG RE SUPP: 25.0000 mg | Freq: Four times a day (QID) | RECTAL | Status: DC | PRN

## 2013-04-30 NOTE — ED Notes (Signed)
Nausea all day Wednesday, vomiting for the past 4 hours. Pt with history of gastroparesis and feels like this is similar to past episodes. Pt has medications at home but "feels to weak to take them".

## 2013-04-30 NOTE — ED Provider Notes (Signed)
CSN: [id number]     Arrival date & time 04/30/13  0132 History   First MD Initiated Contact with Patient 04/30/13 0246     Chief Complaint  Patient presents with  . Nausea   (Consider location/radiation/quality/duration/timing/severity/associated sxs/prior Treatment) HPI 62 year old male presents emergency department via EMS from home with complaint of nausea and vomiting.  Patient reports history of gastroparesis.  He reports that daily.  He has nausea.  Starting tonight around 8 PM, however, he began to have severe vomiting.  He's been unable to keep down any medications to help control his vomiting.  He denies any specific abdominal pain.  No fever no chills.  No change in his diet.  No missed medications prior to tonight.  Patient reports he has had similar presentations in the past  Past Medical History  Diagnosis Date  . Diabetes mellitus   . Hypertension   . CAD (coronary artery disease)   . 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 Gastroparesis   . 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 GERD (gastroesophageal reflux disease)   . Hyperlipidemia   . Peripheral neuropathy   . Sleep apnea    Past Surgical History  Procedure Laterality Date  . Cholecystectomy  01.2012  . Coronary stent placement  12 2010  . Left foot    . Ivc filter placement    . Charcoid joint surgery    . Left leg i&d    . Left leg rod    . Rt toe amputation    . Left shoulder arthroscopy     Family History  Problem Relation Age of Onset  . COPD Mother   . Heart disease Mother   . Lung cancer Mother    History  Substance Use Topics  . Smoking status: Never Smoker   . Smokeless tobacco: Never Used  . Alcohol Use: No    Review of Systems  See History of Present Illness; otherwise all other systems are reviewed and negative  Allergies  Codeine; Gabapentin; and Nabumetone  Home Medications   Current Outpatient Rx  Name  Route  Sig  Dispense  Refill  . aspirin  81 MG chewable tablet   Oral   Chew 81 mg by mouth at bedtime.          . Clobetasol Prop Emollient Base 0.05 % emollient cream   Topical   Apply topically 2 (two) times daily.         . Dapagliflozin Propanediol (FARXIGA) 5 MG TABS   Oral   Take 5 mg by mouth daily.         . diphenhydramine-acetaminophen (EQ PAIN RELIEVER PM) 25-500 MG TABS   Oral   Take 2 tablets by mouth at bedtime.           . docusate sodium (COLACE) 100 MG capsule   Oral   Take 100 mg by mouth daily.         [name] HYDROcodone-acetaminophen (NORCO/VICODIN) 5-325 MG per tablet   Oral   Take 1 tablet by mouth every 6 (six) hours as needed for pain.   15 tablet   0   . insulin detemir (LEVEMIR) 100 UNIT/ML injection   Subcutaneous   Inject 60 Units into the skin See admin instructions. 60 in am, 60 in pm         . insulin lispro (HUMALOG) 100 UNIT/ML injection   Subcutaneous   Inject 4 Units into the skin 3 (  three) times daily before meals. Sliding scale         . Iron 66 MG TABS   Oral   Take 1 tablet by mouth daily.         . lansoprazole (PREVACID) 30 MG capsule   Oral   Take 30 mg by mouth 2 (two) times daily.           . metoCLOPramide (REGLAN) 5 MG tablet   Oral   Take 5 mg by mouth 4 (four) times daily.          . metoprolol succinate (TOPROL-XL) 25 MG 24 hr tablet   Oral   Take by mouth daily. Once daily         . naproxen sodium (ANAPROX) 220 MG tablet   Oral   Take 220 mg by mouth 2 (two) times daily as needed.         [name] oxyCODONE-acetaminophen (PERCOCET/ROXICET) 5-325 MG per tablet   Oral   Take 1-2 tablets by mouth every 6 (six) hours as needed for pain.   20 tablet   0   . pioglitazone (ACTOS) 30 MG tablet   Oral   Take 30 mg by mouth at bedtime.          . pregabalin (LYRICA) 300 MG capsule   Oral   Take 300 mg by mouth 2 (two) times daily.          . promethazine (PHENERGAN) 25 MG tablet   Oral   Take 1 tablet (25 mg total) by mouth every  6 (six) hours as needed for nausea.   12 tablet   0   . promethazine (PHENERGAN) 25 MG tablet   Oral   Take 1 tablet (25 mg total) by mouth every 6 (six) hours as needed for nausea.   15 tablet   0   . quinapril (ACCUPRIL) 5 MG tablet   Oral   Take 5 mg by mouth at bedtime.          . simvastatin (ZOCOR) 20 MG tablet   Oral   Take 30 mg by mouth at bedtime.          . sitaGLIPtan-metformin (JANUMET) 50-1000 MG per tablet   Oral   Take 2 tablets by mouth daily.           BP 148/69  Pulse 92  Temp(Src) 98.1 F (36.7 C) (Oral)  Resp 20  SpO2 100% Physical Exam  Nursing note and vitals reviewed. Constitutional: He is oriented to person, place, and time. He appears well-developed and well-nourished. He appears distressed (uncomfortable appearing).  Morbidly obese  HENT:  Head: Normocephalic and atraumatic.  Nose: Nose normal.  Dry mucous membranes  Eyes: Conjunctivae and EOM are normal. Pupils are equal, round, and reactive to light.  Neck: Normal range of motion. Neck supple. No JVD present. No tracheal deviation present. No thyromegaly present.  Cardiovascular: Normal rate, regular rhythm, normal heart sounds and intact distal pulses.  Exam reveals no gallop and no friction rub.   No murmur heard. Pulmonary/Chest: Breath sounds normal. No stridor. No respiratory distress. He has no wheezes. He has no rales. He exhibits no tenderness.  Mild tachypnea  Abdominal: Soft. Bowel sounds are normal. He exhibits no distension and no mass. There is no tenderness. There is no rebound and no guarding.  Musculoskeletal: Normal range of motion. He exhibits no edema and no tenderness.  Lymphadenopathy:    He has no cervical adenopathy.  Neurological: He  is alert and oriented to person, place, and time. He exhibits normal muscle tone. Coordination normal.  Skin: Skin is warm and dry. No rash noted. No erythema. No pallor.  Psychiatric: He has a normal mood and affect. His behavior  is normal. Judgment and thought content normal.    ED Course  Procedures (including critical care time) Labs Review Labs Reviewed  COMPREHENSIVE METABOLIC PANEL - Abnormal; Notable for the following:    Glucose, Bld 194 (*)    Calcium 10.8 (*)    GFR calc non Af Amer 88 (*)    All other components within normal limits  URINALYSIS, ROUTINE W REFLEX MICROSCOPIC - Abnormal; Notable for the following:    Specific Gravity, Urine 1.036 (*)    Glucose, UA >1000 (*)    Ketones, ur >80 (*)    All other components within normal limits  GLUCOSE, CAPILLARY - Abnormal; Notable for the following:    Glucose-Capillary 178 (*)    All other components within normal limits  URINE MICROSCOPIC-ADD ON  CBC WITH DIFFERENTIAL  CBC WITH DIFFERENTIAL   Imaging Review No results found.    MDM   1. Nausea and vomiting   2. Gastroparesis    62 year old male with persistent vomiting.  He has dehydration on exam.  Patient is a difficult IV stick, given his dehydration.  IV team has managed to get a IV started.  We'll give IV fluids, antiemetics.  Lab or shows ketones in his urine and elevated glucosuria.  Otherwise, labs look good.  Expect if we are able to control his vomiting, he should be able to go home.  5:54 AM Patient reports persistent nausea.  He has not had any further vomiting in the last hour.  Patient recently given Reglan and Benadryl.  Will reassess in 45 minutes to an hour.  Patient may require admission for his persistent vomiting, however.  6:46 AM Patient is feeling some better.  He has been given ginger ale and crackers to try for by mouth challenge.  7:15 AM Pt has tolerated PO challenge.  He is feeling much better, and is ready to go home  Olivia Mackie, MD 04/30/13 [phone]

## 2013-04-30 NOTE — ED Notes (Signed)
Attempted to start IV x2 by two different RNs. No IV access at this time. Charge RN to attempt IV start.

## 2013-04-30 NOTE — ED Notes (Signed)
Bed: WA21 Expected date:  Expected time:  Means of arrival:  Comments: EMS 62yo M, N/V, hx of gastroparesis

## 2013-04-30 NOTE — ED Notes (Signed)
Charge RN attempted to start IV x 2 without success. IV team called at this time

## 2013-05-01 ENCOUNTER — Encounter: Payer: Self-pay | Admitting: Cardiovascular Disease

## 2013-05-01 ENCOUNTER — Ambulatory Visit (INDEPENDENT_AMBULATORY_CARE_PROVIDER_SITE_OTHER): Payer: Medicare Other | Admitting: Cardiovascular Disease

## 2013-05-01 VITALS — BP 130/82 | HR 81 | Ht 74.5 in | Wt 318.3 lb

## 2013-05-01 DIAGNOSIS — I10 Essential (primary) hypertension: Secondary | ICD-10-CM

## 2013-05-01 DIAGNOSIS — E1169 Type 2 diabetes mellitus with other specified complication: Secondary | ICD-10-CM | POA: Insufficient documentation

## 2013-05-01 DIAGNOSIS — E785 Hyperlipidemia, unspecified: Secondary | ICD-10-CM

## 2013-05-01 DIAGNOSIS — I2510 Atherosclerotic heart disease of native coronary artery without angina pectoris: Secondary | ICD-10-CM

## 2013-05-01 NOTE — Patient Instructions (Signed)
Your physician wants you to follow-up in: 1 year with Dr Berry. You will receive a reminder letter in the mail two months in advance. If you don't receive a letter, please call our office to schedule the follow-up appointment.  

## 2013-05-01 NOTE — Assessment & Plan Note (Signed)
On statin therapy followed by her PCP 

## 2013-05-01 NOTE — Assessment & Plan Note (Signed)
Well-controlled on current medications 

## 2013-05-01 NOTE — Progress Notes (Signed)
05/01/2013 Samuel [name]   17-Apr-1951  [id number]  Primary Physician [name], MD Primary Cardiologist: Runell Gess MD Roseanne Reno   HPI:  The patient is a 62 year old severely overweight, divorced Caucasian male with no children who I last saw in the office on July 27, 2010. Unfortunately, his brother who was 60 years old died in his sleep this past 01-Oct-2022. The patient has a history of CAD status post a stenting June 14, 2009, using a drug-eluting stent via the right radial approach. He also had a 70% to 80% distal PDA lesion on a small vessel but normal LV function. His other problems include obstructive sleep apnea and followed with CPAP, hyperlipidemia and diabetes. He had pulmonary embolus in September of last year and has been on Coumadin anticoagulation recently. Dr. Maple Hudson had to stop his Coumadin. He denies chest pain or shortness of breath.    Current Outpatient Prescriptions  Medication Sig Dispense Refill  . acetaminophen (TYLENOL) 500 MG tablet Take 500 mg by mouth 2 (two) times daily.      [name] aspirin 81 MG chewable tablet Chew 81 mg by mouth at bedtime.       . Canagliflozin (INVOKANA) 100 MG TABS Take 100 mg by mouth daily.      . diphenhydramine-acetaminophen (EQ PAIN RELIEVER PM) 25-500 MG TABS Take 2 tablets by mouth at bedtime.        . docusate sodium (COLACE) 100 MG capsule Take 100 mg by mouth daily.      . ferrous sulfate 325 (65 FE) MG tablet Take 325 mg by mouth daily with breakfast.      . gabapentin (NEURONTIN) 300 MG capsule Take 300 mg by mouth 3 (three) times daily.      . insulin detemir (LEVEMIR) 100 UNIT/ML injection Inject 60 Units into the skin See admin instructions. 80 in am, 80 in pm      . Iron 66 MG TABS Take 1 tablet by mouth daily.      . lansoprazole (PREVACID) 30 MG capsule Take 30 mg by mouth 2 (two) times daily.        . metoCLOPramide (REGLAN) 10 MG tablet Take 10 mg by mouth 4 (four) times daily.      . naproxen  sodium (ANAPROX) 220 MG tablet Take 220 mg by mouth 2 (two) times daily as needed.      . pioglitazone (ACTOS) 30 MG tablet Take 30 mg by mouth at bedtime.       . pregabalin (LYRICA) 300 MG capsule Take 300 mg by mouth 3 (three) times daily.       . promethazine (PHENERGAN) 25 MG suppository Place 1 suppository (25 mg total) rectally every 6 (six) hours as needed for nausea or vomiting.  12 each  0  . promethazine (PHENERGAN) 25 MG tablet Take 1 tablet (25 mg total) by mouth every 6 (six) hours as needed for nausea.  12 tablet  0  . quinapril (ACCUPRIL) 5 MG tablet Take 5 mg by mouth at bedtime.       . ranitidine (ZANTAC) 150 MG tablet Take 150 mg by mouth daily.      . simvastatin (ZOCOR) 40 MG tablet Take 1 and 1/2 tablets a day      . sitaGLIPtin (JANUVIA) 100 MG tablet Take 100 mg by mouth daily.      . insulin lispro (HUMALOG) 100 UNIT/ML injection Inject 4 Units into the skin 3 (three) times daily before  meals. Sliding scale       No current facility-administered medications for this visit.    Allergies  Allergen Reactions  . Codeine     REACTION: GI Upset  . Gabapentin     REACTION: rash/hives  . Nabumetone     REACTION: sick on stomach and rash    History   Social History  . Marital Status: Divorced    Spouse Name: N/A    Number of Children: N/A  . Years of Education: N/A   Occupational History  . DISABLITY    Social History Main Topics  . Smoking status: Never Smoker   . Smokeless tobacco: Never Used  . Alcohol Use: No  . Drug Use: No  . Sexual Activity: Not on file   Other Topics Concern  . Not on file   Social History Narrative  . No narrative on file     Review of Systems: General: negative for chills, fever, night sweats or weight changes.  Cardiovascular: negative for chest pain, dyspnea on exertion, edema, orthopnea, palpitations, paroxysmal nocturnal dyspnea or shortness of breath Dermatological: negative for rash Respiratory: negative for cough  or wheezing Urologic: negative for hematuria Abdominal: negative for nausea, vomiting, diarrhea, bright red blood per rectum, melena, or hematemesis Neurologic: negative for visual changes, syncope, or dizziness All other systems reviewed and are otherwise negative except as noted above.    Blood pressure 130/82, pulse 81, height 6' 2.5" (1.892 m), weight 318 lb 4.8 oz (144.38 kg).  General appearance: alert and no distress Neck: no adenopathy, no carotid bruit, no JVD, supple, symmetrical, trachea midline and thyroid not enlarged, symmetric, no tenderness/mass/nodules Lungs: clear to auscultation bilaterally Heart: regular rate and rhythm, S1, S2 normal, no murmur, click, rub or gallop Extremities: extremities normal, atraumatic, no cyanosis or edema  EKG normal sinus rhythm 81 without ST or T wave changes  ASSESSMENT AND PLAN:   Coronary artery disease Status post stenting of his mid LAD with a drug-eluting stent 06/14/09 done via the right radial approach. He had noncritical disease otherwise with normal LV function. He had a Myoview stress test performed 08/24/09 which was low risk and nonischemic. He denies chest pain or shortness of breath.  Essential hypertension Well-controlled on current medications  Hyperlipidemia On statin therapy followed by her PCP      Runell Gess MD [facility], [facility] 05/01/2013 3:43 PM

## 2013-05-01 NOTE — Assessment & Plan Note (Signed)
Status post stenting of his mid LAD with a drug-eluting stent 06/14/09 done via the right radial approach. He had noncritical disease otherwise with normal LV function. He had a Myoview stress test performed 08/24/09 which was low risk and nonischemic. He denies chest pain or shortness of breath.

## 2013-06-02 ENCOUNTER — Emergency Department (HOSPITAL_COMMUNITY)
Admission: EM | Admit: 2013-06-02 | Discharge: 2013-06-02 | Disposition: A | Discharge status: Home / Self Care | Payer: Medicare Other | Attending: Emergency Medicine | Admitting: Emergency Medicine

## 2013-06-02 ENCOUNTER — Encounter (HOSPITAL_COMMUNITY): Payer: Self-pay | Admitting: Emergency Medicine

## 2013-06-02 DIAGNOSIS — I10 Essential (primary) hypertension: Secondary | ICD-10-CM | POA: Insufficient documentation

## 2013-06-02 DIAGNOSIS — I2510 Atherosclerotic heart disease of native coronary artery without angina pectoris: Secondary | ICD-10-CM | POA: Insufficient documentation

## 2013-06-02 DIAGNOSIS — E1349 Other specified diabetes mellitus with other diabetic neurological complication: Secondary | ICD-10-CM | POA: Insufficient documentation

## 2013-06-02 DIAGNOSIS — Z7982 Long term (current) use of aspirin: Secondary | ICD-10-CM | POA: Insufficient documentation

## 2013-06-02 DIAGNOSIS — E785 Hyperlipidemia, unspecified: Secondary | ICD-10-CM | POA: Insufficient documentation

## 2013-06-02 DIAGNOSIS — Z794 Long term (current) use of insulin: Secondary | ICD-10-CM | POA: Insufficient documentation

## 2013-06-02 DIAGNOSIS — K3184 Gastroparesis: Secondary | ICD-10-CM | POA: Insufficient documentation

## 2013-06-02 DIAGNOSIS — G609 Hereditary and idiopathic neuropathy, unspecified: Secondary | ICD-10-CM | POA: Insufficient documentation

## 2013-06-02 DIAGNOSIS — K219 Gastro-esophageal reflux disease without esophagitis: Secondary | ICD-10-CM | POA: Insufficient documentation

## 2013-06-02 DIAGNOSIS — Z79899 Other long term (current) drug therapy: Secondary | ICD-10-CM | POA: Insufficient documentation

## 2013-06-02 DIAGNOSIS — Z9861 Coronary angioplasty status: Secondary | ICD-10-CM | POA: Insufficient documentation

## 2013-06-02 LAB — CBC WITH DIFFERENTIAL/PLATELET
Basophils Absolute: 0 10*3/uL (ref 0.0–0.1)
Basophils Relative: 0 % (ref 0–1)
Eosinophils Relative: 1 % (ref 0–5)
Hemoglobin: 15.1 g/dL (ref 13.0–17.0)
Lymphs Abs: 1.3 10*3/uL (ref 0.7–4.0)
MCHC: 34.7 g/dL (ref 30.0–36.0)
MCV: 91.8 fL (ref 78.0–100.0)
Monocytes Relative: 6 % (ref 3–12)
Neutro Abs: 4.8 10*3/uL (ref 1.7–7.7)
Neutrophils Relative %: 73 % (ref 43–77)
Platelets: 185 10*3/uL (ref 150–400)
RBC: 4.74 MIL/uL (ref 4.22–5.81)
RDW: 12.9 % (ref 11.5–15.5)

## 2013-06-02 LAB — URINALYSIS, ROUTINE W REFLEX MICROSCOPIC
Bilirubin Urine: NEGATIVE
Glucose, UA: >1000 mg/dL — AB
Hgb urine dipstick: NEGATIVE
Ketones, ur: 40 mg/dL — AB
Leukocytes, UA: NEGATIVE
Nitrite: NEGATIVE
Protein, ur: NEGATIVE mg/dL
Specific Gravity, Urine: 1.02 (ref 1.005–1.030)
Urobilinogen, UA: 0.2 mg/dL (ref 0.0–1.0)
pH: 8.5 — ABNORMAL HIGH (ref 5.0–8.0)

## 2013-06-02 LAB — URINE MICROSCOPIC-ADD ON: Urine-Other: NONE SEEN

## 2013-06-02 LAB — COMPREHENSIVE METABOLIC PANEL
ALT: 28 U/L (ref 0–53)
AST: 39 U/L — ABNORMAL HIGH (ref 0–37)
Albumin: 3.8 g/dL (ref 3.5–5.2)
Alkaline Phosphatase: 56 U/L (ref 39–117)
BUN: 23 mg/dL (ref 6–23)
Calcium: 9.8 mg/dL (ref 8.4–10.5)
Chloride: 97 mEq/L (ref 96–112)
Creatinine, Ser: 1.03 mg/dL (ref 0.50–1.35)
Potassium: 5.3 mEq/L — ABNORMAL HIGH (ref 3.5–5.1)
Sodium: 132 mEq/L — ABNORMAL LOW (ref 135–145)
Total Bilirubin: 0.4 mg/dL (ref 0.3–1.2)
Total Protein: 7.8 g/dL (ref 6.0–8.3)

## 2013-06-02 MED ORDER — PROMETHAZINE HCL 25 MG/ML IJ SOLN
12.5000 mg | Freq: Once | INTRAMUSCULAR | Status: AC
  Administered 2013-06-02: 12.5 mg via INTRAVENOUS
  Filled 2013-06-02: qty 1

## 2013-06-02 MED ORDER — SODIUM CHLORIDE 0.9 % IV BOLUS (SEPSIS)
1000.0000 mL | Freq: Once | INTRAVENOUS | Status: AC
  Administered 2013-06-02: 1000 mL via INTRAVENOUS

## 2013-06-02 MED ORDER — PROMETHAZINE HCL 25 MG PO TABS: 25.0000 mg | ORAL_TABLET | Freq: Four times a day (QID) | ORAL | Status: DC | PRN

## 2013-06-02 NOTE — ED Notes (Signed)
Pt c/o NVD w/ mid abd pain since 0530 today.  Hx of gastroparesis.

## 2013-06-02 NOTE — ED Provider Notes (Signed)
Medical screening examination/treatment/procedure(s) were performed by non-physician practitioner and as supervising physician I was immediately available for consultation/collaboration.  EKG Interpretation   None        Courtney F Horton, MD 06/02/13 2327 

## 2013-06-02 NOTE — ED Provider Notes (Signed)
CSN: [id number]     Arrival date & time 06/02/13  1433 History   First MD Initiated Contact with Patient 06/02/13 1521     Chief Complaint  Patient presents with  . Abdominal Pain  . Emesis  . Diarrhea   (Consider location/radiation/quality/duration/timing/severity/associated sxs/prior Treatment) Patient is a 62 y.o. male presenting with abdominal pain, vomiting, and diarrhea. The history is provided by the patient.  Abdominal Pain Pain location:  Epigastric Pain quality: aching   Pain radiates to:  Does not radiate Pain severity:  Mild Onset quality:  Gradual Duration:  12 hours Timing:  Constant Progression:  Unchanged Context: not alcohol use, not diet changes, not eating, not suspicious food intake and not trauma   Relieved by:  Nothing Worsened by:  Nothing tried Ineffective treatments:  None tried Associated symptoms: diarrhea, nausea and vomiting   Associated symptoms: no anorexia, no chest pain, no chills, no constipation, no cough, no dysuria, no fatigue, no fever, no hematemesis, no hematuria, no melena, no shortness of breath and no sore throat   Nausea:    Severity:  Moderate   Onset quality:  Gradual   Duration:  12 hours   Timing:  Constant   Progression:  Unchanged Risk factors: no alcohol abuse, no aspirin use, no NSAID use and no recent hospitalization   Emesis Associated symptoms: abdominal pain and diarrhea   Associated symptoms: no chills, no headaches, no myalgias and no sore throat   Diarrhea Associated symptoms: abdominal pain and vomiting   Associated symptoms: no chills, no diaphoresis, no fever, no headaches and no myalgias     Past Medical History  Diagnosis Date  . Diabetes mellitus   . Hypertension   . CAD (coronary artery disease)   . 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 Gastroparesis   . 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 GERD (gastroesophageal reflux disease)   . Hyperlipidemia   . Peripheral  neuropathy   . Sleep apnea    Past Surgical History  Procedure Laterality Date  . Cholecystectomy  01.2012  . Coronary stent placement  12 2010  . Left foot    . Ivc filter placement    . Charcoid joint surgery    . Left leg i&d    . Left leg rod    . Rt toe amputation    . Left shoulder arthroscopy     Family History  Problem Relation Age of Onset  . COPD Mother   . Heart disease Mother   . Lung cancer Mother    History  Substance Use Topics  . Smoking status: Never Smoker   . Smokeless tobacco: Never Used  . Alcohol Use: No    Review of Systems  Constitutional: Negative for fever, chills, diaphoresis and fatigue.  HENT: Negative for rhinorrhea and sore throat.   Respiratory: Negative for cough and shortness of breath.   Cardiovascular: Negative for chest pain.  Gastrointestinal: Positive for nausea, vomiting, abdominal pain and diarrhea. Negative for constipation, melena, anorexia and hematemesis.  Endocrine: Negative for polydipsia, polyphagia and polyuria.  Genitourinary: Negative for dysuria, urgency and hematuria.  Musculoskeletal: Negative for myalgias.  Skin: Negative for rash.  Neurological: Negative for dizziness, syncope, weakness and headaches.    Allergies  Codeine; Gabapentin; and Nabumetone  Home Medications   Current Outpatient Rx  Name  Route  Sig  Dispense  Refill  . acetaminophen (TYLENOL) 500 MG tablet   Oral   Take 500  mg by mouth every 8 (eight) hours as needed for moderate pain.          [name] aspirin 81 MG chewable tablet   Oral   Chew 81 mg by mouth at bedtime.          . Canagliflozin (INVOKANA) 100 MG TABS   Oral   Take 100 mg by mouth every evening.          . diphenhydramine-acetaminophen (EQ PAIN RELIEVER PM) 25-500 MG TABS   Oral   Take 2 tablets by mouth at bedtime.           . docusate sodium (COLACE) 100 MG capsule   Oral   Take 100 mg by mouth every evening.          . ferrous sulfate 325 (65 FE) MG tablet    Oral   Take 325 mg by mouth every evening.          . gabapentin (NEURONTIN) 300 MG capsule   Oral   Take 300 mg by mouth 3 (three) times daily.         . insulin detemir (LEVEMIR) 100 UNIT/ML injection   Subcutaneous   Inject 80 Units into the skin 2 (two) times daily.          . insulin lispro (HUMALOG) 100 UNIT/ML injection   Subcutaneous   Inject 4-7 Units into the skin 3 (three) times daily before meals. Sliding scale         . lansoprazole (PREVACID) 30 MG capsule   Oral   Take 30 mg by mouth 2 (two) times daily.           . metoCLOPramide (REGLAN) 10 MG tablet   Oral   Take 10 mg by mouth 4 (four) times daily.         . naproxen sodium (ANAPROX) 220 MG tablet   Oral   Take 220 mg by mouth 2 (two) times daily as needed (pain).          . pioglitazone (ACTOS) 30 MG tablet   Oral   Take 30 mg by mouth at bedtime.          . pregabalin (LYRICA) 300 MG capsule   Oral   Take 300 mg by mouth 3 (three) times daily.          . promethazine (PHENERGAN) 25 MG suppository   Rectal   Place 1 suppository (25 mg total) rectally every 6 (six) hours as needed for nausea or vomiting.   12 each   0   . promethazine (PHENERGAN) 25 MG tablet   Oral   Take 1 tablet (25 mg total) by mouth every 6 (six) hours as needed for nausea.   12 tablet   0   . quinapril (ACCUPRIL) 5 MG tablet   Oral   Take 5 mg by mouth at bedtime.          . ranitidine (ZANTAC) 150 MG tablet   Oral   Take 150 mg by mouth daily.         . simvastatin (ZOCOR) 40 MG tablet   Oral   Take 60 mg by mouth every evening. Take 1 and 1/2 tablets daily         . sitaGLIPtin (JANUVIA) 100 MG tablet   Oral   Take 100 mg by mouth every evening.          . promethazine (PHENERGAN) 25 MG tablet   Oral   Take  1 tablet (25 mg total) by mouth every 6 (six) hours as needed for nausea or vomiting.   10 tablet   0    BP 145/72  Pulse 72  Temp(Src) 98.7 F (37.1 C) (Oral)  Resp 20   SpO2 98% Physical Exam  Nursing note and vitals reviewed. Constitutional: He is oriented to person, place, and time. He appears well-developed and well-nourished. No distress.  HENT:  Head: Normocephalic and atraumatic.  Mouth/Throat: Oropharynx is clear and moist. No oropharyngeal exudate.  Eyes: Pupils are equal, round, and reactive to light.  Neck: Normal range of motion. Neck supple.  Cardiovascular: Normal rate, regular rhythm and normal heart sounds.  Exam reveals no gallop and no friction rub.   Pulmonary/Chest: Effort normal and breath sounds normal.  Abdominal: Soft. Bowel sounds are normal. He exhibits no distension. There is no tenderness. There is no guarding.  Neurological: He is alert and oriented to person, place, and time.  Skin: Skin is warm and dry. No erythema.    ED Course  Procedures (including critical care time) Labs Review Labs Reviewed  COMPREHENSIVE METABOLIC PANEL - Abnormal; Notable for the following:    Sodium 132 (*)    Potassium 5.3 (*)    Glucose, Bld 224 (*)    AST 39 (*)    GFR calc non Af Amer 77 (*)    GFR calc Af Amer 89 (*)    All other components within normal limits  CBC WITH DIFFERENTIAL  LIPASE, BLOOD  URINALYSIS, ROUTINE W REFLEX MICROSCOPIC   MDM   1. Gastroparesis      Patient, states he needs to leave the emergency room before it gets dark outside.  The patient, states, feeling some better, but not completely better.  Patient does not want any further treatment or care patient is advised return here as needed.  Told to followup with his primary care  Carlyle Dolly, PA-C 06/02/13 1701

## 2013-12-20 ENCOUNTER — Emergency Department (HOSPITAL_COMMUNITY)
Admission: EM | Admit: 2013-12-20 | Discharge: 2013-12-20 | Disposition: A | Discharge status: Home / Self Care | Payer: Medicare Other | Attending: Emergency Medicine | Admitting: Emergency Medicine

## 2013-12-20 ENCOUNTER — Encounter (HOSPITAL_COMMUNITY): Payer: Self-pay | Admitting: Emergency Medicine

## 2013-12-20 ENCOUNTER — Inpatient Hospital Stay (HOSPITAL_COMMUNITY)
Admission: EM | Admit: 2013-12-20 | Discharge: 2013-12-23 | DRG: 074 | Disposition: A | Discharge status: Home / Self Care | Payer: Medicare Other | Attending: Internal Medicine | Admitting: Internal Medicine

## 2013-12-20 DIAGNOSIS — Z801 Family history of malignant neoplasm of trachea, bronchus and lung: Secondary | ICD-10-CM

## 2013-12-20 DIAGNOSIS — E1169 Type 2 diabetes mellitus with other specified complication: Secondary | ICD-10-CM | POA: Diagnosis present

## 2013-12-20 DIAGNOSIS — Z79899 Other long term (current) drug therapy: Secondary | ICD-10-CM | POA: Insufficient documentation

## 2013-12-20 DIAGNOSIS — M9080 Osteopathy in diseases classified elsewhere, unspecified site: Secondary | ICD-10-CM | POA: Diagnosis present

## 2013-12-20 DIAGNOSIS — Z9861 Coronary angioplasty status: Secondary | ICD-10-CM

## 2013-12-20 DIAGNOSIS — E669 Obesity, unspecified: Secondary | ICD-10-CM | POA: Insufficient documentation

## 2013-12-20 DIAGNOSIS — E109 Type 1 diabetes mellitus without complications: Secondary | ICD-10-CM | POA: Diagnosis present

## 2013-12-20 DIAGNOSIS — I25118 Atherosclerotic heart disease of native coronary artery with other forms of angina pectoris: Secondary | ICD-10-CM

## 2013-12-20 DIAGNOSIS — K3184 Gastroparesis: Secondary | ICD-10-CM | POA: Insufficient documentation

## 2013-12-20 DIAGNOSIS — E118 Type 2 diabetes mellitus with unspecified complications: Secondary | ICD-10-CM

## 2013-12-20 DIAGNOSIS — L97909 Non-pressure chronic ulcer of unspecified part of unspecified lower leg with unspecified severity: Secondary | ICD-10-CM | POA: Diagnosis present

## 2013-12-20 DIAGNOSIS — K219 Gastro-esophageal reflux disease without esophagitis: Secondary | ICD-10-CM | POA: Insufficient documentation

## 2013-12-20 DIAGNOSIS — Z794 Long term (current) use of insulin: Secondary | ICD-10-CM

## 2013-12-20 DIAGNOSIS — E119 Type 2 diabetes mellitus without complications: Secondary | ICD-10-CM | POA: Insufficient documentation

## 2013-12-20 DIAGNOSIS — G4733 Obstructive sleep apnea (adult) (pediatric): Secondary | ICD-10-CM | POA: Diagnosis present

## 2013-12-20 DIAGNOSIS — E1069 Type 1 diabetes mellitus with other specified complication: Secondary | ICD-10-CM | POA: Diagnosis present

## 2013-12-20 DIAGNOSIS — Z7982 Long term (current) use of aspirin: Secondary | ICD-10-CM | POA: Insufficient documentation

## 2013-12-20 DIAGNOSIS — J301 Allergic rhinitis due to pollen: Secondary | ICD-10-CM

## 2013-12-20 DIAGNOSIS — E1122 Type 2 diabetes mellitus with diabetic chronic kidney disease: Secondary | ICD-10-CM

## 2013-12-20 DIAGNOSIS — I2510 Atherosclerotic heart disease of native coronary artery without angina pectoris: Secondary | ICD-10-CM | POA: Diagnosis present

## 2013-12-20 DIAGNOSIS — R112 Nausea with vomiting, unspecified: Secondary | ICD-10-CM | POA: Diagnosis present

## 2013-12-20 DIAGNOSIS — E1143 Type 2 diabetes mellitus with diabetic autonomic (poly)neuropathy: Secondary | ICD-10-CM | POA: Diagnosis present

## 2013-12-20 DIAGNOSIS — K822 Perforation of gallbladder: Secondary | ICD-10-CM

## 2013-12-20 DIAGNOSIS — IMO0002 Reserved for concepts with insufficient information to code with codable children: Secondary | ICD-10-CM

## 2013-12-20 DIAGNOSIS — E785 Hyperlipidemia, unspecified: Secondary | ICD-10-CM | POA: Diagnosis present

## 2013-12-20 DIAGNOSIS — M86669 Other chronic osteomyelitis, unspecified tibia and fibula: Secondary | ICD-10-CM

## 2013-12-20 DIAGNOSIS — A4101 Sepsis due to Methicillin susceptible Staphylococcus aureus: Secondary | ICD-10-CM

## 2013-12-20 DIAGNOSIS — I10 Essential (primary) hypertension: Secondary | ICD-10-CM | POA: Diagnosis present

## 2013-12-20 DIAGNOSIS — R531 Weakness: Secondary | ICD-10-CM

## 2013-12-20 DIAGNOSIS — R1110 Vomiting, unspecified: Secondary | ICD-10-CM

## 2013-12-20 DIAGNOSIS — Z8249 Family history of ischemic heart disease and other diseases of the circulatory system: Secondary | ICD-10-CM

## 2013-12-20 DIAGNOSIS — E1049 Type 1 diabetes mellitus with other diabetic neurological complication: Principal | ICD-10-CM | POA: Diagnosis present

## 2013-12-20 DIAGNOSIS — M86179 Other acute osteomyelitis, unspecified ankle and foot: Secondary | ICD-10-CM | POA: Diagnosis present

## 2013-12-20 DIAGNOSIS — E1165 Type 2 diabetes mellitus with hyperglycemia: Secondary | ICD-10-CM

## 2013-12-20 DIAGNOSIS — Z791 Long term (current) use of non-steroidal anti-inflammatories (NSAID): Secondary | ICD-10-CM | POA: Insufficient documentation

## 2013-12-20 DIAGNOSIS — I2699 Other pulmonary embolism without acute cor pulmonale: Secondary | ICD-10-CM

## 2013-12-20 LAB — CBC WITH DIFFERENTIAL/PLATELET
BASOS ABS: 0 10*3/uL (ref 0.0–0.1)
BASOS PCT: 0 % (ref 0–1)
Eosinophils Absolute: 0 10*3/uL (ref 0.0–0.7)
Eosinophils Relative: 0 % (ref 0–5)
HCT: 47 % (ref 39.0–52.0)
Hemoglobin: 16.1 g/dL (ref 13.0–17.0)
Lymphocytes Relative: 18 % (ref 12–46)
Lymphs Abs: 1.3 10*3/uL (ref 0.7–4.0)
MCH: 31.2 pg (ref 26.0–34.0)
MCHC: 34.3 g/dL (ref 30.0–36.0)
MCV: 91.1 fL (ref 78.0–100.0)
Monocytes Absolute: 0.4 10*3/uL (ref 0.1–1.0)
Monocytes Relative: 5 % (ref 3–12)
NEUTROS ABS: 5.6 10*3/uL (ref 1.7–7.7)
Neutrophils Relative %: 77 % (ref 43–77)
Platelets: 206 10*3/uL (ref 150–400)
RBC: 5.16 MIL/uL (ref 4.22–5.81)
RDW: 12.9 % (ref 11.5–15.5)
WBC: 7.4 10*3/uL (ref 4.0–10.5)

## 2013-12-20 LAB — COMPREHENSIVE METABOLIC PANEL
ALBUMIN: 4.1 g/dL (ref 3.5–5.2)
ALT: 21 U/L (ref 0–53)
AST: 24 U/L (ref 0–37)
Alkaline Phosphatase: 60 U/L (ref 39–117)
Anion gap: 22 — ABNORMAL HIGH (ref 5–15)
BUN: 27 mg/dL — ABNORMAL HIGH (ref 6–23)
CHLORIDE: 96 meq/L (ref 96–112)
CO2: 17 mEq/L — ABNORMAL LOW (ref 19–32)
CREATININE: 1.16 mg/dL (ref 0.50–1.35)
Calcium: 10.2 mg/dL (ref 8.4–10.5)
GFR calc Af Amer: 77 mL/min — ABNORMAL LOW (ref >90)
GFR calc non Af Amer: 67 mL/min — ABNORMAL LOW (ref >90)
Glucose, Bld: 161 mg/dL — ABNORMAL HIGH (ref 70–99)
Potassium: 4.7 mEq/L (ref 3.7–5.3)
Sodium: 135 mEq/L — ABNORMAL LOW (ref 137–147)
Total Bilirubin: 0.6 mg/dL (ref 0.3–1.2)
Total Protein: 8.3 g/dL (ref 6.0–8.3)

## 2013-12-20 LAB — LIPASE, BLOOD: LIPASE: 18 U/L (ref 11–59)

## 2013-12-20 MED ORDER — PROMETHAZINE HCL 25 MG/ML IJ SOLN
12.5000 mg | Freq: Once | INTRAMUSCULAR | Status: AC
  Administered 2013-12-20: 12.5 mg via INTRAVENOUS
  Filled 2013-12-20: qty 1

## 2013-12-20 MED ORDER — SODIUM CHLORIDE 0.9 % IV BOLUS (SEPSIS)
1000.0000 mL | INTRAVENOUS | Status: AC
  Administered 2013-12-20: 1000 mL via INTRAVENOUS

## 2013-12-20 MED ORDER — HYDROMORPHONE HCL PF 1 MG/ML IJ SOLN: 0.5000 mg | Freq: Once | INTRAMUSCULAR | Status: DC

## 2013-12-20 NOTE — ED Provider Notes (Signed)
CSN: [id number]     Arrival date & time 12/20/13  1658 History   First MD Initiated Contact with Patient 12/20/13 1751     Chief Complaint  Patient presents with  . Abdominal Pain  . Nausea  . Emesis     (Consider location/radiation/quality/duration/timing/severity/associated sxs/prior Treatment) Patient is a 63 y.o. male presenting with vomiting. The history is provided by the patient.  Emesis Severity:  Moderate Duration:  5 days Timing:  Constant Quality:  Stomach contents Able to tolerate:  Liquids Progression:  Worsening Chronicity:  Recurrent Recent urination:  Normal Relieved by:  Nothing Ineffective treatments:  None tried Associated symptoms: no abdominal pain, no cough, no fever and no headaches   Risk factors: no alcohol use     Past Medical History  Diagnosis Date  . Diabetes mellitus   . Hypertension   . CAD (coronary artery disease)   . 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 Gastroparesis   . 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 GERD (gastroesophageal reflux disease)   . Hyperlipidemia   . Peripheral neuropathy   . Sleep apnea    Past Surgical History  Procedure Laterality Date  . Cholecystectomy  01.2012  . Coronary stent placement  12 2010  . Left foot    . Ivc filter placement    . Charcoid joint surgery    . Left leg i&d    . Left leg rod    . Rt toe amputation    . Left shoulder arthroscopy     Family History  Problem Relation Age of Onset  . COPD Mother   . Heart disease Mother   . Lung cancer Mother    History  Substance Use Topics  . Smoking status: Never Smoker   . Smokeless tobacco: Never Used  . Alcohol Use: No    Review of Systems  HENT: Negative for drooling and rhinorrhea.   Eyes: Negative for pain.  Cardiovascular: Negative for leg swelling.  Gastrointestinal: Positive for vomiting. Negative for abdominal pain.  Musculoskeletal: Negative for gait problem and neck pain.  Skin: Negative  for color change.  Neurological: Negative for numbness and headaches.  Hematological: Negative for adenopathy.  Psychiatric/Behavioral: Negative for behavioral problems.  All other systems reviewed and are negative.     Allergies  Codeine; Gabapentin; and Nabumetone  Home Medications   Prior to Admission medications   Medication Sig Start Date End Date Taking? Authorizing Provider  acetaminophen (TYLENOL) 500 MG tablet Take 500 mg by mouth every 8 (eight) hours as needed for moderate pain.    Yes Historical Provider, MD  aspirin 81 MG chewable tablet Chew 81 mg by mouth at bedtime.    Yes Historical Provider, MD  Canagliflozin (INVOKANA) 100 MG TABS Take 100 mg by mouth every evening.    Yes Historical Provider, MD  diphenhydramine-acetaminophen (EQ PAIN RELIEVER PM) 25-500 MG TABS Take 2 tablets by mouth at bedtime.     Yes Historical Provider, MD  docusate sodium (COLACE) 100 MG capsule Take 100 mg by mouth every evening.    Yes Historical Provider, MD  ferrous sulfate 325 (65 FE) MG tablet Take 325 mg by mouth every evening.    Yes Historical Provider, MD  gabapentin (NEURONTIN) 300 MG capsule Take 300 mg by mouth 3 (three) times daily.   Yes Historical Provider, MD  insulin detemir (LEVEMIR) 100 UNIT/ML injection Inject 80 Units into the skin 2 (two) times daily.  Yes Historical Provider, MD  insulin lispro (HUMALOG) 100 UNIT/ML injection Inject 4-7 Units into the skin 3 (three) times daily before meals. Sliding scale   Yes Historical Provider, MD  lansoprazole (PREVACID) 30 MG capsule Take 30 mg by mouth 2 (two) times daily.     Yes Historical Provider, MD  metoCLOPramide (REGLAN) 10 MG tablet Take 10 mg by mouth 4 (four) times daily.   Yes Historical Provider, MD  naproxen sodium (ANAPROX) 220 MG tablet Take 220 mg by mouth 2 (two) times daily as needed (pain).    Yes Historical Provider, MD  pioglitazone (ACTOS) 30 MG tablet Take 30 mg by mouth at bedtime.    Yes Historical  Provider, MD  pregabalin (LYRICA) 300 MG capsule Take 300 mg by mouth 3 (three) times daily.    Yes Historical Provider, MD  promethazine (PHENERGAN) 25 MG suppository Place 1 suppository (25 mg total) rectally every 6 (six) hours as needed for nausea or vomiting. 04/30/13  Yes Olivia Mackielga M Otter, MD  promethazine (PHENERGAN) 25 MG tablet Take 1 tablet (25 mg total) by mouth every 6 (six) hours as needed for nausea. 02/24/13  Yes Jennifer L Piepenbrink, PA-C  quinapril (ACCUPRIL) 5 MG tablet Take 5 mg by mouth at bedtime.    Yes Historical Provider, MD  ranitidine (ZANTAC) 150 MG tablet Take 150 mg by mouth daily.   Yes Historical Provider, MD  simvastatin (ZOCOR) 40 MG tablet Take 60 mg by mouth every evening. Take 1 and 1/2 tablets daily   Yes Historical Provider, MD  sitaGLIPtin (JANUVIA) 100 MG tablet Take 100 mg by mouth every evening.    Yes Historical Provider, MD   BP 139/82  Pulse 100  Temp(Src) 98 F (36.7 C) (Oral)  Resp 20  SpO2 100% Physical Exam  Nursing note and vitals reviewed. Constitutional: He is oriented to person, place, and time. He appears well-developed and well-nourished.  Obese   HENT:  Head: Normocephalic and atraumatic.  Right Ear: External ear normal.  Left Ear: External ear normal.  Nose: Nose normal.  Mouth/Throat: Oropharynx is clear and moist. No oropharyngeal exudate.  Eyes: Conjunctivae and EOM are normal. Pupils are equal, round, and reactive to light.  Neck: Normal range of motion. Neck supple.  Cardiovascular: Normal rate, regular rhythm, normal heart sounds and intact distal pulses.  Exam reveals no gallop and no friction rub.   No murmur heard. Pulmonary/Chest: Effort normal and breath sounds normal. No respiratory distress. He has no wheezes.  Abdominal: Soft. Bowel sounds are normal. He exhibits no distension. There is no tenderness. There is no rebound and no guarding.  Musculoskeletal: Normal range of motion. He exhibits no edema and no tenderness.   Neurological: He is alert and oriented to person, place, and time.  Skin: Skin is warm and dry.  Psychiatric: He has a normal mood and affect. His behavior is normal.    ED Course  Procedures (including critical care time) Labs Review Labs Reviewed  COMPREHENSIVE METABOLIC PANEL - Abnormal; Notable for the following:    Sodium 135 (*)    CO2 17 (*)    Glucose, Bld 161 (*)    BUN 27 (*)    GFR calc non Af Amer 67 (*)    GFR calc Af Amer 77 (*)    Anion gap 22 (*)    All other components within normal limits  CBC WITH DIFFERENTIAL  LIPASE, BLOOD    Imaging Review No results found.   EKG Interpretation  Date/Time:  Sunday December 20 2013 18:32:40 EDT Ventricular Rate:  84 PR Interval:  198 QRS Duration: 88 QT Interval:  406 QTC Calculation: 480 R Axis:   34 Text Interpretation:  Sinus rhythm Ventricular premature complex  Borderline low voltage, extremity leads Borderline prolonged QT interval  No significant change since last tracing Confirmed by Dacie Mandel  MD,  Demeka Sutter ([id number]) on 12/20/2013 7:16:41 PM      MDM   Final diagnoses:  Gastroparesis    6:28 PM 63 y.o. male with a history of diabetes, hypertension, coronary artery disease, gastroparesis who presents with nausea and vomiting which has worsened over the last 5 days. He denies any fevers, abdominal pain, chest pain, or diarrhea. He last had a normal bowel movement yesterday evening. He states that his symptoms are consistent with gastroparesis flares he has had in the past. He states that he has some abdominal soreness due to retching but has no focal pain on exam and his abdomen is soft. Will hydrate and attempt to get symptom control.  7:16 PM: Pt feeling better and requesting to leave. Will d/c home. He had anti-emetics already, including suppositories.  I have discussed the diagnosis/risks/treatment options with the patient and believe the pt to be eligible for discharge home to follow-up with pcp as needed. We  also discussed returning to the ED immediately if new or worsening sx occur. We discussed the sx which are most concerning (e.g., abd pain, fever, intractable vomiting) that necessitate immediate return. Medications administered to the patient during their visit and any new prescriptions provided to the patient are listed below.  Medications given during this visit Medications  sodium chloride 0.9 % bolus 1,000 mL (1,000 mLs Intravenous New Bag/Given 12/20/13 1826)  promethazine (PHENERGAN) injection 12.5 mg (12.5 mg Intravenous Given 12/20/13 1825)    Discharge Medication List as of 12/20/2013  7:18 PM       Randa Spike Mort Sawyers, MD 12/21/13 [phone]

## 2013-12-20 NOTE — ED Notes (Signed)
Pt has gastroparesis, c/o abd pain and n/v x 5 days,.

## 2013-12-20 NOTE — Discharge Instructions (Signed)

## 2013-12-20 NOTE — ED Notes (Signed)
Pt arrived to the ED with a complaint of emesis. Pt was seen here earlier for same symptoms.  Pt states he filled his prescription of phenergan suppositories and has used 2 since his discharge with no relief. Pt states that he has thrown up several times since he has left.

## 2013-12-21 ENCOUNTER — Encounter (HOSPITAL_COMMUNITY): Payer: Self-pay | Admitting: *Deleted

## 2013-12-21 DIAGNOSIS — M86669 Other chronic osteomyelitis, unspecified tibia and fibula: Secondary | ICD-10-CM

## 2013-12-21 DIAGNOSIS — R112 Nausea with vomiting, unspecified: Secondary | ICD-10-CM | POA: Diagnosis present

## 2013-12-21 DIAGNOSIS — I10 Essential (primary) hypertension: Secondary | ICD-10-CM

## 2013-12-21 DIAGNOSIS — I2510 Atherosclerotic heart disease of native coronary artery without angina pectoris: Secondary | ICD-10-CM

## 2013-12-21 DIAGNOSIS — E1143 Type 2 diabetes mellitus with diabetic autonomic (poly)neuropathy: Secondary | ICD-10-CM | POA: Diagnosis present

## 2013-12-21 DIAGNOSIS — E785 Hyperlipidemia, unspecified: Secondary | ICD-10-CM

## 2013-12-21 DIAGNOSIS — L97909 Non-pressure chronic ulcer of unspecified part of unspecified lower leg with unspecified severity: Secondary | ICD-10-CM

## 2013-12-21 DIAGNOSIS — K3184 Gastroparesis: Secondary | ICD-10-CM

## 2013-12-21 DIAGNOSIS — M86179 Other acute osteomyelitis, unspecified ankle and foot: Secondary | ICD-10-CM

## 2013-12-21 DIAGNOSIS — E1129 Type 2 diabetes mellitus with other diabetic kidney complication: Secondary | ICD-10-CM

## 2013-12-21 DIAGNOSIS — N189 Chronic kidney disease, unspecified: Secondary | ICD-10-CM

## 2013-12-21 LAB — BASIC METABOLIC PANEL
ANION GAP: 25 — AB (ref 5–15)
Anion gap: 23 — ABNORMAL HIGH (ref 5–15)
BUN: 27 mg/dL — ABNORMAL HIGH (ref 6–23)
BUN: 26 mg/dL — AB (ref 6–23)
CHLORIDE: 95 meq/L — AB (ref 96–112)
CHLORIDE: 100 meq/L (ref 96–112)
CO2: 16 meq/L — AB (ref 19–32)
CO2: 16 meq/L — AB (ref 19–32)
CREATININE: 1.15 mg/dL (ref 0.50–1.35)
Calcium: 9.7 mg/dL (ref 8.4–10.5)
Calcium: 9.3 mg/dL (ref 8.4–10.5)
Creatinine, Ser: 1.23 mg/dL (ref 0.50–1.35)
GFR calc Af Amer: 72 mL/min — ABNORMAL LOW (ref >90)
GFR calc Af Amer: 78 mL/min — ABNORMAL LOW (ref >90)
GFR calc non Af Amer: 62 mL/min — ABNORMAL LOW (ref >90)
GFR calc non Af Amer: 67 mL/min — ABNORMAL LOW (ref >90)
GLUCOSE: 143 mg/dL — AB (ref 70–99)
Glucose, Bld: 194 mg/dL — ABNORMAL HIGH (ref 70–99)
POTASSIUM: 4.7 meq/L (ref 3.7–5.3)
Potassium: 4.3 mEq/L (ref 3.7–5.3)
SODIUM: 136 meq/L — AB (ref 137–147)
Sodium: 139 mEq/L (ref 137–147)

## 2013-12-21 LAB — GLUCOSE, CAPILLARY
Glucose-Capillary: 156 mg/dL — ABNORMAL HIGH (ref 70–99)
Glucose-Capillary: 150 mg/dL — ABNORMAL HIGH (ref 70–99)
Glucose-Capillary: 160 mg/dL — ABNORMAL HIGH (ref 70–99)
Glucose-Capillary: 132 mg/dL — ABNORMAL HIGH (ref 70–99)
Glucose-Capillary: 146 mg/dL — ABNORMAL HIGH (ref 70–99)

## 2013-12-21 LAB — CBC
HCT: 43 % (ref 39.0–52.0)
HEMOGLOBIN: 14.4 g/dL (ref 13.0–17.0)
MCH: 30.8 pg (ref 26.0–34.0)
MCHC: 33.5 g/dL (ref 30.0–36.0)
MCV: 91.9 fL (ref 78.0–100.0)
Platelets: 182 10*3/uL (ref 150–400)
RBC: 4.68 MIL/uL (ref 4.22–5.81)
RDW: 13.3 % (ref 11.5–15.5)
WBC: 7.8 10*3/uL (ref 4.0–10.5)

## 2013-12-21 LAB — HEMOGLOBIN A1C
Hgb A1c MFr Bld: 7.7 % — ABNORMAL HIGH (ref <5.7)
Mean Plasma Glucose: 174 mg/dL — ABNORMAL HIGH (ref <117)

## 2013-12-21 MED ORDER — PREGABALIN 75 MG PO CAPS: 300.0000 mg | ORAL_CAPSULE | Freq: Three times a day (TID) | ORAL | Status: DC

## 2013-12-21 MED ORDER — ACETAMINOPHEN 650 MG RE SUPP: 650.0000 mg | Freq: Four times a day (QID) | RECTAL | Status: DC | PRN

## 2013-12-21 MED ORDER — METOCLOPRAMIDE HCL 5 MG/ML IJ SOLN
10.0000 mg | Freq: Four times a day (QID) | INTRAMUSCULAR | Status: DC
  Administered 2013-12-21 (×2): 10 mg via INTRAVENOUS
  Filled 2013-12-21 (×5): qty 2

## 2013-12-21 MED ORDER — ASPIRIN 81 MG PO CHEW
81.0000 mg | CHEWABLE_TABLET | Freq: Every day | ORAL | Status: DC
Start: 2013-12-21 — End: 2013-12-23
  Administered 2013-12-21 – 2013-12-22 (×2): 81 mg via ORAL
  Filled 2013-12-21 (×3): qty 1

## 2013-12-21 MED ORDER — PROMETHAZINE HCL 25 MG/ML IJ SOLN
12.5000 mg | Freq: Once | INTRAMUSCULAR | Status: AC
  Administered 2013-12-21: 12.5 mg via INTRAVENOUS
  Filled 2013-12-21: qty 1

## 2013-12-21 MED ORDER — INSULIN ASPART 100 UNIT/ML ~~LOC~~ SOLN
0.0000 [IU] | SUBCUTANEOUS | Status: DC
  Administered 2013-12-21: 1 [IU] via SUBCUTANEOUS
  Administered 2013-12-21: 2 [IU] via SUBCUTANEOUS
  Administered 2013-12-21 (×2): 1 [IU] via SUBCUTANEOUS
  Administered 2013-12-21: 2 [IU] via SUBCUTANEOUS
  Administered 2013-12-22: 1 [IU] via SUBCUTANEOUS
  Administered 2013-12-23: 2 [IU] via SUBCUTANEOUS

## 2013-12-21 MED ORDER — PROMETHAZINE HCL 25 MG/ML IJ SOLN
12.5000 mg | Freq: Four times a day (QID) | INTRAMUSCULAR | Status: DC | PRN
  Administered 2013-12-21 – 2013-12-22 (×3): 12.5 mg via INTRAVENOUS
  Filled 2013-12-21 (×3): qty 1

## 2013-12-21 MED ORDER — PROMETHAZINE HCL 25 MG/ML IJ SOLN
25.0000 mg | Freq: Once | INTRAMUSCULAR | Status: AC
  Administered 2013-12-21: 25 mg via INTRAVENOUS
  Filled 2013-12-21: qty 1

## 2013-12-21 MED ORDER — SODIUM CHLORIDE 0.9 % IV SOLN: INTRAVENOUS | Status: DC

## 2013-12-21 MED ORDER — METOCLOPRAMIDE HCL 5 MG/ML IJ SOLN
10.0000 mg | Freq: Three times a day (TID) | INTRAMUSCULAR | Status: DC
  Administered 2013-12-21 – 2013-12-23 (×5): 10 mg via INTRAVENOUS
  Filled 2013-12-21 (×6): qty 2

## 2013-12-21 MED ORDER — ONDANSETRON HCL 4 MG PO TABS: 4.0000 mg | ORAL_TABLET | Freq: Four times a day (QID) | ORAL | Status: DC | PRN

## 2013-12-21 MED ORDER — ONDANSETRON HCL 4 MG/2ML IJ SOLN
4.0000 mg | Freq: Four times a day (QID) | INTRAMUSCULAR | Status: DC | PRN
  Administered 2013-12-21 (×2): 4 mg via INTRAVENOUS
  Filled 2013-12-21 (×2): qty 2

## 2013-12-21 MED ORDER — OXYCODONE HCL 5 MG PO TABS
5.0000 mg | ORAL_TABLET | ORAL | Status: DC | PRN
  Administered 2013-12-22 (×2): 5 mg via ORAL
  Filled 2013-12-21 (×2): qty 1

## 2013-12-21 MED ORDER — GABAPENTIN 300 MG PO CAPS
300.0000 mg | ORAL_CAPSULE | Freq: Three times a day (TID) | ORAL | Status: DC
  Administered 2013-12-21 – 2013-12-23 (×5): 300 mg via ORAL
  Filled 2013-12-21 (×9): qty 1

## 2013-12-21 MED ORDER — FERROUS SULFATE 325 (65 FE) MG PO TABS
325.0000 mg | ORAL_TABLET | Freq: Every evening | ORAL | Status: DC
  Administered 2013-12-22: 325 mg via ORAL
  Filled 2013-12-21 (×3): qty 1

## 2013-12-21 MED ORDER — SODIUM CHLORIDE 0.9 % IV SOLN
INTRAVENOUS | Status: DC
  Administered 2013-12-21 – 2013-12-22 (×3): via INTRAVENOUS

## 2013-12-21 MED ORDER — PREGABALIN 75 MG PO CAPS
300.0000 mg | ORAL_CAPSULE | Freq: Three times a day (TID) | ORAL | Status: DC
  Administered 2013-12-21 – 2013-12-22 (×4): 300 mg via ORAL
  Filled 2013-12-21 (×4): qty 4

## 2013-12-21 MED ORDER — ALUM & MAG HYDROXIDE-SIMETH 200-200-20 MG/5ML PO SUSP: 30.0000 mL | Freq: Four times a day (QID) | ORAL | Status: DC | PRN

## 2013-12-21 MED ORDER — ACETAMINOPHEN 325 MG PO TABS
650.0000 mg | ORAL_TABLET | Freq: Four times a day (QID) | ORAL | Status: DC | PRN
Start: 2013-12-21 — End: 2013-12-23
  Administered 2013-12-22: 650 mg via ORAL
  Filled 2013-12-21: qty 2

## 2013-12-21 MED ORDER — SIMVASTATIN 40 MG PO TABS
60.0000 mg | ORAL_TABLET | Freq: Every evening | ORAL | Status: DC
  Administered 2013-12-22: 17:00:00 60 mg via ORAL
  Filled 2013-12-21 (×3): qty 1

## 2013-12-21 MED ORDER — HYDROMORPHONE HCL PF 1 MG/ML IJ SOLN: 1.0000 mg | Freq: Once | INTRAMUSCULAR | Status: DC

## 2013-12-21 MED ORDER — SODIUM CHLORIDE 0.9 % IV BOLUS (SEPSIS)
1000.0000 mL | Freq: Once | INTRAVENOUS | Status: AC
  Administered 2013-12-21: 1000 mL via INTRAVENOUS

## 2013-12-21 MED ORDER — HYDROMORPHONE HCL PF 1 MG/ML IJ SOLN
0.5000 mg | INTRAMUSCULAR | Status: DC | PRN
  Administered 2013-12-21: 1 mg via INTRAVENOUS
  Filled 2013-12-21: qty 1

## 2013-12-21 MED ORDER — ENOXAPARIN SODIUM 80 MG/0.8ML ~~LOC~~ SOLN
70.0000 mg | SUBCUTANEOUS | Status: DC
  Administered 2013-12-21 – 2013-12-23 (×3): 70 mg via SUBCUTANEOUS
  Filled 2013-12-21 (×3): qty 0.8

## 2013-12-21 NOTE — Progress Notes (Signed)
Inpatient Diabetes Program Recommendations  AACE/ADA: New Consensus Statement on Inpatient Glycemic Control (2013)  Target Ranges:  Prepandial:   less than 140 mg/dL      Peak postprandial:   less than 180 mg/dL (1-2 hours)      Critically ill patients:  140 - 180 mg/dL   Inpatient Diabetes Program Recommendations Insulin - Basal: add low dose basal Levemir 15 units  Also consider adding dextrose to fluids since pt is NPO. Thank you  Samuel ClimesGina Lynniah [name] BSN, RN,CDE Inpatient Diabetes Coordinator [phone] (team pager)

## 2013-12-21 NOTE — Progress Notes (Signed)
Utilization Review Completed.Samuel [name] T7/11/2013  

## 2013-12-21 NOTE — ED Notes (Signed)
MD at bedside. 

## 2013-12-21 NOTE — Progress Notes (Signed)
[facility] PROGRESS NOTE  Samuel BusingJohn D Pareja ZOX:[id number] DOB: 12-16-1950 DOA: 12/20/2013 PCP: Pearson GrippeKIM, JAMES, MD  Assessment/Plan: 1. Diabetic Gastroparesis -Patient with history of diabetic gastroparesis, having a Gastric Emptying Scan on 07/03/2010 that showed delayed solid phase gastric emptying suggesting gastroparesis -Presenting with intractible nausea/vomiting -Will continue Scheduled Reglan 10 mg IV TID, as needed IV antiemetic therapy -If N/V persist today may need NG tube for decompression  2. Type II Diabetes Mellitus -Blood sugars are stable, CBG's in the 150's to 170's/  -Will continue accuchecks every 4 hours with sliding scale coverage.  3.  Dyslipidemia. -On statin therapy  4.  HTN -Blood pressures are stable, with SB{'s in the 110's to 130's.   5. DVT Prophylaxis -Lovenox   Code Status: Full Code Family Communication:  Disposition Plan: Continue supportive care of gastroparesis exacerbation    HPI/Subjective: Samuel [name] is a 63 y.o. male with a history of IDDM and Gastroparesis who presents to the ED with complaints of severe nausea and vomiting over the past 4-5 days. This morning he continues to do poorly having multiple episodes of nausea/vomiting.   Objective: Filed Vitals:   12/21/13 0942  BP: 131/75  Pulse: 107  Temp: 97.9 F (36.6 C)  Resp: 22    Intake/Output Summary (Last 24 hours) at 12/21/13 1208 Last data filed at 12/21/13 0831  Gross per 24 hour  Intake      0 ml  Output    226 ml  Net   -226 ml   Filed Weights   12/21/13 0120  Weight: 137.757 kg (303 lb 11.2 oz)    Exam:   General:  Ill appearing, reporting several episodes of N/V this morning  Cardiovascular: Regular rate and rhythm, normal S1S2  Respiratory: Clear to auscultation bilaterally, normal inspiratory effort  Abdomen: Soft, nontender nondistended  Musculoskeletal: no edema  Data Reviewed: Basic Metabolic Panel:  Recent Labs Lab 12/20/13 1724  12/21/13 0107 12/21/13 0530  NA 135* 136* 139  K 4.7 4.7 4.3  CL 96 95* 100  CO2 17* 16* 16*  GLUCOSE 161* 194* 143*  BUN 27* 27* 26*  CREATININE 1.16 1.23 1.15  CALCIUM 10.2 9.7 9.3   Liver Function Tests:  Recent Labs Lab 12/20/13 1724  AST 24  ALT 21  ALKPHOS 60  BILITOT 0.6  PROT 8.3  ALBUMIN 4.1    Recent Labs Lab 12/20/13 1724  LIPASE 18   No results found for this basename: AMMONIA,  in the last 168 hours CBC:  Recent Labs Lab 12/20/13 1724 12/21/13 0530  WBC 7.4 7.8  NEUTROABS 5.6  --   HGB 16.1 14.4  HCT 47.0 43.0  MCV 91.1 91.9  PLT 206 182   Cardiac Enzymes: No results found for this basename: CKTOTAL, CKMB, CKMBINDEX, TROPONINI,  in the last 168 hours BNP (last 3 results) No results found for this basename: PROBNP,  in the last 8760 hours CBG:  Recent Labs Lab 12/21/13 0345 12/21/13 0822  GLUCAP 156* 150*    No results found for this or any previous visit (from the past 240 hour(s)).   Studies: No results found.  Scheduled Meds: . aspirin  81 mg Oral QHS  . enoxaparin (LOVENOX) injection  70 mg Subcutaneous Q24H  . ferrous sulfate  325 mg Oral QPM  . gabapentin  300 mg Oral TID  . HYDROmorphone  1 mg Intravenous Once  . insulin aspart  0-9 Units Subcutaneous 6 times per day  . metoCLOPramide (REGLAN) injection  10 mg Intravenous 4 times per day  . pregabalin  300 mg Oral TID  . simvastatin  60 mg Oral QPM   Continuous Infusions: . sodium chloride 125 mL/hr at 12/21/13 0126  . sodium chloride      Principal Problem:   Intractable nausea and vomiting Active Problems:   DIABETES, TYPE 1   OBSTRUCTIVE SLEEP APNEA   Ulcer of lower limb, unspecified   Acute osteomyelitis, ankle and foot   Coronary artery disease   Essential hypertension   Hyperlipidemia   Gastroparesis    Time spent: 35 min    Jeralyn BennettZAMORA, Abdul Beirne  [facility] Pager [phone]. If 7PM-7AM, please contact night-coverage at [email or web address], password  [facility] [email or web address] 12/21/2013, 12:08 PM  LOS: 1 day

## 2013-12-21 NOTE — H&P (Signed)
[facility] History and Physical  Samuel BusingJohn D [name] ZOX:[id number] DOB: 27-Feb-1951 DOA: 12/20/2013  Referring physician:  EDP PCP: Pearson GrippeKIM, JAMES, MD  Specialists:   Chief Complaint:  Nausea and Vomiting  HPI: Samuel BusingJohn D Fodge is a 63 y.o. male with a history of IDDM and Gastroparesis who presents to the ED with complaints of severe nausea and vomiting over the past 4-5 days.  He reports not being able to hold down foods or liquids.  He denies having any diarrhea, or constipation or fevers or chills.      Review of Systems:  Constitutional: No Weight Loss, No Weight Gain, Night Sweats, Fevers, Chills, Fatigue, or Generalized Weakness HEENT: No Headaches, Difficulty Swallowing,Tooth/Dental Problems,Sore Throat,  No Sneezing, Rhinitis, Ear Ache, Nasal Congestion, or Post Nasal Drip,  Cardio-vascular:  No Chest pain, Orthopnea, PND, Edema in lower extremities, Anasarca, Dizziness, Palpitations  Resp: No Dyspnea, No DOE, No Cough, No Hemoptysis, No Wheezing.    GI: No Heartburn, Indigestion, +Abdominal Pain, +Nausea, +Vomiting, Diarrhea, Change in Bowel Habits,  Loss of Appetite  GU: No Dysuria, Change in Color of Urine, No Urgency or Frequency.  No flank pain.  Musculoskeletal: No Joint Pain or Swelling.  No Decreased Range of Motion. No Back Pain.  Neurologic: No Syncope, No Seizures, Muscle Weakness, Paresthesia, Vision Disturbance or Loss, No Diplopia, No Vertigo, No Difficulty Walking,  Skin: No Rash or Lesions. Psych: No Change in Mood or Affect. No Depression or Anxiety. No Memory loss. No Confusion or Hallucinations   Past Medical History  Diagnosis Date  . Diabetes mellitus   . Hypertension   . CAD (coronary artery disease)   . 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 Gastroparesis   . 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 GERD (gastroesophageal reflux disease)   . Hyperlipidemia   . Peripheral neuropathy   . Sleep apnea     Past Surgical History   Procedure Laterality Date  . Cholecystectomy  01.2012  . Coronary stent placement  12 2010  . Left foot    . Ivc filter placement    . Charcoid joint surgery    . Left leg i&d    . Left leg rod    . Rt toe amputation    . Left shoulder arthroscopy       Prior to Admission medications   Medication Sig Start Date End Date Taking? Authorizing Provider  acetaminophen (TYLENOL) 500 MG tablet Take 500 mg by mouth every 8 (eight) hours as needed for moderate pain.    Yes Historical Provider, MD  aspirin 81 MG chewable tablet Chew 81 mg by mouth at bedtime.    Yes Historical Provider, MD  Canagliflozin (INVOKANA) 100 MG TABS Take 100 mg by mouth every evening.    Yes Historical Provider, MD  diphenhydramine-acetaminophen (EQ PAIN RELIEVER PM) 25-500 MG TABS Take 2 tablets by mouth at bedtime.     Yes Historical Provider, MD  docusate sodium (COLACE) 100 MG capsule Take 100 mg by mouth every evening.    Yes Historical Provider, MD  ferrous sulfate 325 (65 FE) MG tablet Take 325 mg by mouth every evening.    Yes Historical Provider, MD  gabapentin (NEURONTIN) 300 MG capsule Take 300 mg by mouth 3 (three) times daily.   Yes Historical Provider, MD  insulin detemir (LEVEMIR) 100 UNIT/ML injection Inject 80 Units into the skin 2 (two) times daily.    Yes Historical Provider, MD  insulin lispro (  HUMALOG) 100 UNIT/ML injection Inject 4-7 Units into the skin 3 (three) times daily before meals. Sliding scale   Yes Historical Provider, MD  lansoprazole (PREVACID) 30 MG capsule Take 30 mg by mouth 2 (two) times daily.     Yes Historical Provider, MD  metoCLOPramide (REGLAN) 10 MG tablet Take 10 mg by mouth 4 (four) times daily.   Yes Historical Provider, MD  naproxen sodium (ANAPROX) 220 MG tablet Take 220 mg by mouth 2 (two) times daily as needed (pain).    Yes Historical Provider, MD  pioglitazone (ACTOS) 30 MG tablet Take 30 mg by mouth at bedtime.    Yes Historical Provider, MD  pregabalin (LYRICA) 300  MG capsule Take 300 mg by mouth 3 (three) times daily.    Yes Historical Provider, MD  promethazine (PHENERGAN) 25 MG suppository Place 1 suppository (25 mg total) rectally every 6 (six) hours as needed for nausea or vomiting. 04/30/13  Yes Olivia Mackie, MD  promethazine (PHENERGAN) 25 MG tablet Take 1 tablet (25 mg total) by mouth every 6 (six) hours as needed for nausea. 02/24/13  Yes Jennifer L Piepenbrink, PA-C  quinapril (ACCUPRIL) 5 MG tablet Take 5 mg by mouth at bedtime.    Yes Historical Provider, MD  ranitidine (ZANTAC) 150 MG tablet Take 150 mg by mouth daily.   Yes Historical Provider, MD  simvastatin (ZOCOR) 40 MG tablet Take 60 mg by mouth every evening. Take 1 and 1/2 tablets daily   Yes Historical Provider, MD  sitaGLIPtin (JANUVIA) 100 MG tablet Take 100 mg by mouth every evening.    Yes Historical Provider, MD     Allergies  Allergen Reactions  . Codeine     REACTION: GI Upset  . Gabapentin     REACTION: rash/hives (per patient, was a reaction to an inactive ingredient in another mgf brand)   . Nabumetone     REACTION: sick on stomach and rash    Social History:  reports that he has never smoked. He has never used smokeless tobacco. He reports that he does not drink alcohol or use illicit drugs.     Family History  Problem Relation Age of Onset  . COPD Mother   . Heart disease Mother   . Lung cancer Mother       Physical Exam:  GEN:  Pleasant  63 y.o. male  examined  and in no acute distress; cooperative with exam Filed Vitals:   12/20/13 2350 12/21/13 0120  BP: 165/82 131/71  Pulse: 114 110  Temp: 98.4 F (36.9 C) 97.9 F (36.6 C)  TempSrc: Oral Oral  Resp: 22 22  Height:  6\' 3"  (1.905 m)  Weight:  137.757 kg (303 lb 11.2 oz)  SpO2: 99% 94%   Blood pressure 131/71, pulse 110, temperature 97.9 F (36.6 C), temperature source Oral, resp. rate 22, height 6\' 3"  (1.905 m), weight 137.757 kg (303 lb 11.2 oz), SpO2 94.00%. PSYCH: SHe is alert and oriented x4;  does not appear anxious does not appear depressed; affect is normal HEENT: Normocephalic and Atraumatic, Mucous membranes pink; PERRLA; EOM intact; Fundi:  Benign;  No scleral icterus, Nares: Patent, Oropharynx: Clear, Edentulous or Fair Dentition, Neck:  FROM, no cervical lymphadenopathy nor thyromegaly or carotid bruit; no JVD; Breasts:: Not examined CHEST WALL: No tenderness CHEST: Normal respiration, clear to auscultation bilaterally HEART: Regular rate and rhythm; no murmurs rubs or gallops BACK: No kyphosis or scoliosis; no CVA tenderness ABDOMEN: Positive Bowel Sounds, Scaphoid, Obese, soft  non-tender; no masses, no organomegaly, no pannus; no intertriginous candida. Rectal Exam: Not done EXTREMITIES: No bone or joint deformity; age-appropriate arthropathy of the hands and knees; no cyanosis, clubbing or edema; no ulcerations. Genitalia: not examined PULSES: 2+ and symmetric SKIN: Normal hydration no rash or ulceration CNS:   Mental Status:  Alert, oriented, thought content appropriate. Speech fluent without evidence of aphasia. Able to follow 3 step commands without difficulty. In No obvious pain.  Cranial Nerves:  II: Discs flat bilaterally; Visual fields Intact,  or Decreased peripheral vision to the left or right. Pupils equal and reactive.  III,IV, VI: right ptosis, extra-ocular motions intact bilaterally  V,VII: smile symmetric, facial light touch sensation normal bilaterally  VIII: hearing decreasesd bilaterally  IX,X: gag reflex present  XI: bilateral shoulder shrug  XII: midline tongue extension  Motor:  Right : Upper extremity 5/5 Left: Upper extremity 5/5  Lower extremity 5/5 Lower extremity 5/5  Tone and bulk:normal tone throughout; no atrophy noted  Sensory: Pinprick and light touch intact throughout, bilaterally  Deep Tendon Reflexes: 2+ and symmetric throughout  Plantars/ Babinski: Right: equivocal or upgoing or normal Left: equivocal or upgoing or normal    Cerebellar:  Finger to nose with or without difficulty.  Gait: deferred  Vascular: pulses palpable throughout    Labs on Admission:  Basic Metabolic Panel:  Recent Labs Lab 12/20/13 1724 12/21/13 0107  NA 135* 136*  K 4.7 4.7  CL 96 95*  CO2 17* 16*  GLUCOSE 161* 194*  BUN 27* 27*  CREATININE 1.16 1.23  CALCIUM 10.2 9.7   Liver Function Tests:  Recent Labs Lab 12/20/13 1724  AST 24  ALT 21  ALKPHOS 60  BILITOT 0.6  PROT 8.3  ALBUMIN 4.1    Recent Labs Lab 12/20/13 1724  LIPASE 18   No results found for this basename: AMMONIA,  in the last 168 hours CBC:  Recent Labs Lab 12/20/13 1724  WBC 7.4  NEUTROABS 5.6  HGB 16.1  HCT 47.0  MCV 91.1  PLT 206   Cardiac Enzymes: No results found for this basename: CKTOTAL, CKMB, CKMBINDEX, TROPONINI,  in the last 168 hours  BNP (last 3 results) No results found for this basename: PROBNP,  in the last 8760 hours CBG: No results found for this basename: GLUCAP,  in the last 168 hours  Radiological Exams on Admission: No results found.     Assessment/Plan:   63 y.o. male with  Principal Problem:   Intractable nausea and vomiting Active Problems:   DIABETES, TYPE 1   Gastroparesis   Coronary artery disease   Essential hypertension   Hyperlipidemia   OBSTRUCTIVE SLEEP APNEA   Ulcer of lower limb, unspecified   Acute osteomyelitis, ankle and foot    1.   Intractable N+V/ Gastroparesis-   Admit for symptom control, IV Reglan ordered, and PRN Anti-emetics.  Clear liquid Diet to advance as tolerated.     2.   IDDM-   SSI coverage, and monitor Glucose levels,   Check HbA1C.  Januvia placed on hold while poor oral intake.    3.   HTN-  Monitor BPs,  IV Hydralazine PRN.     4.   CAD- stable,  Continue ASA rx.    5.   Hyperlipidemia-  Continue Statin Rx.     6.   OSA-   Monitor O2 levels, verify whether on CPAP qhs at home.    7.   Acute Osteomyelitis/ Ulcer LLE-  Under  care of Dr. Victorino Dike  Orthopedics and to undergo surgery in a few weeks.     8.  DVT prophylaxis with Lovenox.        Code Status:      FULL CODE Family Communication:    No Family present Disposition Plan:       Inpatient  Time spent:  61 Minutes  Ron Parker [facility] Pager [phone]  If 7PM-7AM, please contact night-coverage [email or web address] Password [email or web address] 12/21/2013, 3:17 AM

## 2013-12-21 NOTE — Progress Notes (Signed)
Tried to get report from ED but nurse is to busy to come to the phone.  Will try again in a few.

## 2013-12-21 NOTE — Progress Notes (Signed)
After taking zofran pt continues to have dry heaves.  He will sleep or rest for a while then they seem to comeback.

## 2013-12-21 NOTE — ED Provider Notes (Signed)
CSN: [id number]     Arrival date & time 12/20/13  2332 History   First MD Initiated Contact with Patient 12/21/13 0019     Chief Complaint  Patient presents with  . Emesis     (Consider location/radiation/quality/duration/timing/severity/associated sxs/prior Treatment) Patient is a 63 y.o. male presenting with vomiting. The history is provided by the patient.  Emesis  patient here with crampy abdominal pain and vomiting. Seen here earlier today for similar symptoms and has a long-standing history of gastroparesis due to his diabetes. To bring it home without relief and has had multiple episodes of vomiting since leaving the facility. His emesis has been nonbilious and nonbloody. Denies any diarrhea. Patient returned to 2 persistent symptoms.  Past Medical History  Diagnosis Date  . Diabetes mellitus   . Hypertension   . CAD (coronary artery disease)   . 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 Gastroparesis   . 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 GERD (gastroesophageal reflux disease)   . Hyperlipidemia   . Peripheral neuropathy   . Sleep apnea    Past Surgical History  Procedure Laterality Date  . Cholecystectomy  01.2012  . Coronary stent placement  12 2010  . Left foot    . Ivc filter placement    . Charcoid joint surgery    . Left leg i&d    . Left leg rod    . Rt toe amputation    . Left shoulder arthroscopy     Family History  Problem Relation Age of Onset  . COPD Mother   . Heart disease Mother   . Lung cancer Mother    History  Substance Use Topics  . Smoking status: Never Smoker   . Smokeless tobacco: Never Used  . Alcohol Use: No    Review of Systems  Gastrointestinal: Positive for vomiting.  All other systems reviewed and are negative.     Allergies  Codeine; Gabapentin; and Nabumetone  Home Medications   Prior to Admission medications   Medication Sig Start Date End Date Taking? Authorizing Provider   acetaminophen (TYLENOL) 500 MG tablet Take 500 mg by mouth every 8 (eight) hours as needed for moderate pain.     Historical Provider, MD  aspirin 81 MG chewable tablet Chew 81 mg by mouth at bedtime.     Historical Provider, MD  Canagliflozin (INVOKANA) 100 MG TABS Take 100 mg by mouth every evening.     Historical Provider, MD  diphenhydramine-acetaminophen (EQ PAIN RELIEVER PM) 25-500 MG TABS Take 2 tablets by mouth at bedtime.      Historical Provider, MD  docusate sodium (COLACE) 100 MG capsule Take 100 mg by mouth every evening.     Historical Provider, MD  ferrous sulfate 325 (65 FE) MG tablet Take 325 mg by mouth every evening.     Historical Provider, MD  gabapentin (NEURONTIN) 300 MG capsule Take 300 mg by mouth 3 (three) times daily.    Historical Provider, MD  insulin detemir (LEVEMIR) 100 UNIT/ML injection Inject 80 Units into the skin 2 (two) times daily.     Historical Provider, MD  insulin lispro (HUMALOG) 100 UNIT/ML injection Inject 4-7 Units into the skin 3 (three) times daily before meals. Sliding scale    Historical Provider, MD  lansoprazole (PREVACID) 30 MG capsule Take 30 mg by mouth 2 (two) times daily.      Historical Provider, MD  metoCLOPramide (REGLAN) 10 MG tablet Take 10  mg by mouth 4 (four) times daily.    Historical Provider, MD  naproxen sodium (ANAPROX) 220 MG tablet Take 220 mg by mouth 2 (two) times daily as needed (pain).     Historical Provider, MD  pioglitazone (ACTOS) 30 MG tablet Take 30 mg by mouth at bedtime.     Historical Provider, MD  pregabalin (LYRICA) 300 MG capsule Take 300 mg by mouth 3 (three) times daily.     Historical Provider, MD  promethazine (PHENERGAN) 25 MG suppository Place 1 suppository (25 mg total) rectally every 6 (six) hours as needed for nausea or vomiting. 04/30/13   Olivia Mackielga M Otter, MD  promethazine (PHENERGAN) 25 MG tablet Take 1 tablet (25 mg total) by mouth every 6 (six) hours as needed for nausea. 02/24/13   Jennifer L Piepenbrink,  PA-C  quinapril (ACCUPRIL) 5 MG tablet Take 5 mg by mouth at bedtime.     Historical Provider, MD  ranitidine (ZANTAC) 150 MG tablet Take 150 mg by mouth daily.    Historical Provider, MD  simvastatin (ZOCOR) 40 MG tablet Take 60 mg by mouth every evening. Take 1 and 1/2 tablets daily    Historical Provider, MD  sitaGLIPtin (JANUVIA) 100 MG tablet Take 100 mg by mouth every evening.     Historical Provider, MD   BP 165/82  Pulse 114  Temp(Src) 98.4 F (36.9 C) (Oral)  Resp 22  SpO2 99% Physical Exam  Nursing note and vitals reviewed. Constitutional: He is oriented to person, place, and time. He appears well-developed and well-nourished.  Non-toxic appearance. No distress.  HENT:  Head: Normocephalic and atraumatic.  Eyes: Conjunctivae, EOM and lids are normal. Pupils are equal, round, and reactive to light.  Neck: Normal range of motion. Neck supple. No tracheal deviation present. No mass present.  Cardiovascular: Regular rhythm and normal heart sounds.  Tachycardia present.  Exam reveals no gallop.   No murmur heard. Pulmonary/Chest: Effort normal and breath sounds normal. No stridor. No respiratory distress. He has no decreased breath sounds. He has no wheezes. He has no rhonchi. He has no rales.  Abdominal: Soft. Normal appearance and bowel sounds are normal. He exhibits no distension. There is generalized tenderness. There is no rigidity, no rebound, no guarding and no CVA tenderness.  Musculoskeletal: Normal range of motion. He exhibits no edema and no tenderness.  Neurological: He is alert and oriented to person, place, and time. He has normal strength. No cranial nerve deficit or sensory deficit. GCS eye subscore is 4. GCS verbal subscore is 5. GCS motor subscore is 6.  Skin: Skin is warm and dry. No abrasion and no rash noted.  Psychiatric: He has a normal mood and affect. His speech is normal and behavior is normal.    ED Course  Procedures (including critical care time) Labs  Review Labs Reviewed - No data to display  Imaging Review No results found.   EKG Interpretation None      MDM   Final diagnoses:  None    Patient here with recurrence of his gastroparesis. Will give IV fluids, promethazine, opiate meds for pain. And admitted to [facility].    Toy BakerAnthony T Abena Erdman, MD 12/21/13 0030

## 2013-12-21 NOTE — ED Notes (Signed)
Pt's brother, Nedra HaiLee: [phone]

## 2013-12-22 DIAGNOSIS — R5381 Other malaise: Secondary | ICD-10-CM

## 2013-12-22 DIAGNOSIS — R5383 Other fatigue: Secondary | ICD-10-CM

## 2013-12-22 DIAGNOSIS — IMO0002 Reserved for concepts with insufficient information to code with codable children: Secondary | ICD-10-CM

## 2013-12-22 DIAGNOSIS — R1115 Cyclical vomiting syndrome unrelated to migraine: Secondary | ICD-10-CM

## 2013-12-22 DIAGNOSIS — E118 Type 2 diabetes mellitus with unspecified complications: Secondary | ICD-10-CM

## 2013-12-22 DIAGNOSIS — E1165 Type 2 diabetes mellitus with hyperglycemia: Secondary | ICD-10-CM

## 2013-12-22 DIAGNOSIS — R1110 Vomiting, unspecified: Secondary | ICD-10-CM | POA: Diagnosis present

## 2013-12-22 LAB — GLUCOSE, CAPILLARY
GLUCOSE-CAPILLARY: 106 mg/dL — AB (ref 70–99)
GLUCOSE-CAPILLARY: 140 mg/dL — AB (ref 70–99)
Glucose-Capillary: 103 mg/dL — ABNORMAL HIGH (ref 70–99)
Glucose-Capillary: 124 mg/dL — ABNORMAL HIGH (ref 70–99)
Glucose-Capillary: 107 mg/dL — ABNORMAL HIGH (ref 70–99)
Glucose-Capillary: 118 mg/dL — ABNORMAL HIGH (ref 70–99)

## 2013-12-22 LAB — BASIC METABOLIC PANEL
Anion gap: 21 — ABNORMAL HIGH (ref 5–15)
BUN: 24 mg/dL — AB (ref 6–23)
CHLORIDE: 103 meq/L (ref 96–112)
CO2: 17 meq/L — AB (ref 19–32)
Calcium: 9.2 mg/dL (ref 8.4–10.5)
Creatinine, Ser: 1.12 mg/dL (ref 0.50–1.35)
GFR calc non Af Amer: 70 mL/min — ABNORMAL LOW (ref >90)
GFR, EST AFRICAN AMERICAN: 81 mL/min — AB (ref >90)
GLUCOSE: 93 mg/dL (ref 70–99)
Potassium: 3.9 mEq/L (ref 3.7–5.3)
Sodium: 141 mEq/L (ref 137–147)

## 2013-12-22 LAB — CBC
HCT: 40.6 % (ref 39.0–52.0)
HEMOGLOBIN: 13.4 g/dL (ref 13.0–17.0)
MCH: 30.7 pg (ref 26.0–34.0)
MCHC: 33 g/dL (ref 30.0–36.0)
MCV: 92.9 fL (ref 78.0–100.0)
Platelets: 175 10*3/uL (ref 150–400)
RBC: 4.37 MIL/uL (ref 4.22–5.81)
RDW: 13.7 % (ref 11.5–15.5)
WBC: 6.1 10*3/uL (ref 4.0–10.5)

## 2013-12-22 NOTE — Progress Notes (Signed)
[facility] PROGRESS NOTE  Beckie BusingJohn D Beehler FAO:[id number] DOB: Jun 29, 1950 DOA: 12/20/2013 PCP: Pearson GrippeKIM, JAMES, MD Interim Summary Beckie BusingJohn D Samuel [name] is a 63 y.o. male with a history of IDDM and Gastroparesis who presented to the ED with complaints of severe nausea and vomiting over the past 4-5 days, admitted to the medicine service on 12/21/2013. In 2012 had a Gastric Emptying Scan that showed delayed solid phase gastric emptying suggesting gastroparesis, as it was felt this represented a gastroparesis exacerbation. He was treated with IV antiemetic therapy and IV fluids. His diet was advanced to clears on 12/22/2013. Showing slow improvement.                                                                     Assessment/Plan: 1. Diabetic Gastroparesis -Patient with history of diabetic gastroparesis, having a Gastric Emptying Scan on 07/03/2010 that showed delayed solid phase gastric emptying suggesting gastroparesis -Presenting with intractible nausea/vomiting -Will continue Scheduled Reglan 10 mg IV TID, as needed IV antiemetic therapy -Advanced diet to clears today.   2. Type II Diabetes Mellitus -Blood sugars are stable, CBG's in the 150's to 170's/  -Will continue accuchecks every 4 hours with sliding scale coverage.  3.  Dyslipidemia. -On statin therapy  4.  HTN -Blood pressures are stable, with SB{'s in the 110's to 130's.   5. DVT Prophylaxis -Lovenox   Code Status: Full Code Family Communication:  Disposition Plan: Continue supportive care of gastroparesis exacerbation    HPI/Subjective: He thinks he' feeling a little better and would like to try clears. Has not vomited today.    Objective: Filed Vitals:   12/22/13 1508  BP: 122/74  Pulse: 90  Temp: 97.5 F (36.4 C)  Resp: 20    Intake/Output Summary (Last 24 hours) at 12/22/13 1740 Last data filed at 12/22/13 1353  Gross per 24 hour  Intake   1225 ml  Output    600 ml  Net    625 ml   Filed Weights   12/21/13  0120  Weight: 137.757 kg (303 lb 11.2 oz)    Exam:   General:  Ill appearing, reporting several episodes of N/V this morning  Cardiovascular: Regular rate and rhythm, normal S1S2  Respiratory: Clear to auscultation bilaterally, normal inspiratory effort  Abdomen: Soft, nontender nondistended  Musculoskeletal: no edema  Data Reviewed: Basic Metabolic Panel:  Recent Labs Lab 12/20/13 1724 12/21/13 0107 12/21/13 0530 12/22/13 0500  NA 135* 136* 139 141  K 4.7 4.7 4.3 3.9  CL 96 95* 100 103  CO2 17* 16* 16* 17*  GLUCOSE 161* 194* 143* 93  BUN 27* 27* 26* 24*  CREATININE 1.16 1.23 1.15 1.12  CALCIUM 10.2 9.7 9.3 9.2   Liver Function Tests:  Recent Labs Lab 12/20/13 1724  AST 24  ALT 21  ALKPHOS 60  BILITOT 0.6  PROT 8.3  ALBUMIN 4.1    Recent Labs Lab 12/20/13 1724  LIPASE 18   No results found for this basename: AMMONIA,  in the last 168 hours CBC:  Recent Labs Lab 12/20/13 1724 12/21/13 0530 12/22/13 0500  WBC 7.4 7.8 6.1  NEUTROABS 5.6  --   --   HGB 16.1 14.4 13.4  HCT 47.0 43.0 40.6  MCV 91.1  91.9 92.9  PLT 206 182 175   Cardiac Enzymes: No results found for this basename: CKTOTAL, CKMB, CKMBINDEX, TROPONINI,  in the last 168 hours BNP (last 3 results) No results found for this basename: PROBNP,  in the last 8760 hours CBG:  Recent Labs Lab 12/22/13 0019 12/22/13 0422 12/22/13 0949 12/22/13 1152 12/22/13 1638  GLUCAP 140* 103* 106* 124* 107*    No results found for this or any previous visit (from the past 240 hour(s)).   Studies: No results found.  Scheduled Meds: . aspirin  81 mg Oral QHS  . enoxaparin (LOVENOX) injection  70 mg Subcutaneous Q24H  . ferrous sulfate  325 mg Oral QPM  . gabapentin  300 mg Oral TID  . HYDROmorphone  1 mg Intravenous Once  . insulin aspart  0-9 Units Subcutaneous 6 times per day  . metoCLOPramide (REGLAN) injection  10 mg Intravenous 3 times per day  . pregabalin  300 mg Oral TID  .  simvastatin  60 mg Oral QPM   Continuous Infusions: . sodium chloride 75 mL/hr at 12/22/13 [id number]    Principal Problem:   Intractable nausea and vomiting Active Problems:   DIABETES, TYPE 1   OBSTRUCTIVE SLEEP APNEA   Ulcer of lower limb, unspecified   Acute osteomyelitis, ankle and foot   Coronary artery disease   Essential hypertension   Hyperlipidemia   Gastroparesis   Vomiting    Time spent: 25 min    Jeralyn BennettZAMORA, Onnie Alatorre  [facility] Pager [phone]. If 7PM-7AM, please contact night-coverage at [email or web address], password [email or web address] 12/22/2013, 5:40 PM  LOS: 2 days

## 2013-12-23 DIAGNOSIS — G4733 Obstructive sleep apnea (adult) (pediatric): Secondary | ICD-10-CM

## 2013-12-23 LAB — CBC
HCT: 40.5 % (ref 39.0–52.0)
Hemoglobin: 13.6 g/dL (ref 13.0–17.0)
MCH: 30.7 pg (ref 26.0–34.0)
MCHC: 33.6 g/dL (ref 30.0–36.0)
MCV: 91.4 fL (ref 78.0–100.0)
PLATELETS: 159 10*3/uL (ref 150–400)
RBC: 4.43 MIL/uL (ref 4.22–5.81)
RDW: 13.7 % (ref 11.5–15.5)
WBC: 5.8 10*3/uL (ref 4.0–10.5)

## 2013-12-23 LAB — BASIC METABOLIC PANEL
ANION GAP: 15 (ref 5–15)
BUN: 19 mg/dL (ref 6–23)
CALCIUM: 9.2 mg/dL (ref 8.4–10.5)
CO2: 20 mEq/L (ref 19–32)
CREATININE: 0.93 mg/dL (ref 0.50–1.35)
Chloride: 102 mEq/L (ref 96–112)
GFR calc Af Amer: >90 mL/min (ref >90)
GFR, EST NON AFRICAN AMERICAN: 89 mL/min — AB (ref >90)
Glucose, Bld: 112 mg/dL — ABNORMAL HIGH (ref 70–99)
Potassium: 3.8 mEq/L (ref 3.7–5.3)
SODIUM: 137 meq/L (ref 137–147)

## 2013-12-23 LAB — GLUCOSE, CAPILLARY
GLUCOSE-CAPILLARY: 102 mg/dL — AB (ref 70–99)
GLUCOSE-CAPILLARY: 161 mg/dL — AB (ref 70–99)
Glucose-Capillary: 102 mg/dL — ABNORMAL HIGH (ref 70–99)
Glucose-Capillary: 98 mg/dL (ref 70–99)

## 2013-12-23 MED ORDER — PROMETHAZINE HCL 25 MG PO TABS
12.5000 mg | ORAL_TABLET | Freq: Four times a day (QID) | ORAL | Status: DC | PRN
  Administered 2013-12-23: 12.5 mg via ORAL
  Filled 2013-12-23: qty 1

## 2013-12-23 MED ORDER — PROMETHAZINE HCL 25 MG/ML IJ SOLN: 12.5000 mg | Freq: Four times a day (QID) | INTRAMUSCULAR | Status: DC | PRN

## 2013-12-23 MED ORDER — METOCLOPRAMIDE HCL 10 MG PO TABS
10.0000 mg | ORAL_TABLET | Freq: Three times a day (TID) | ORAL | Status: DC
  Administered 2013-12-23: 10 mg via ORAL
  Filled 2013-12-23 (×2): qty 1

## 2013-12-23 MED ORDER — PROMETHAZINE HCL 25 MG RE SUPP: 12.5000 mg | Freq: Four times a day (QID) | RECTAL | Status: DC | PRN

## 2013-12-23 NOTE — Discharge Summary (Signed)
Physician Discharge Summary  [name] ZDG:[id number] DOB: 1951-03-15 DOA: 12/20/2013  PCP: [name], MD  Admit date: 12/20/2013 Discharge date: 12/23/2013  Time spent: 35 minutes  Recommendations for Outpatient Follow-up:  1. Home 2. Strict diabetic diet- checking at home and bring to PCP (not requiring levemir/lantus in hospital)  Discharge Diagnoses:  Principal Problem:   Intractable nausea and vomiting Active Problems:   DIABETES, TYPE 1   OBSTRUCTIVE SLEEP APNEA   Ulcer of lower limb, unspecified   Acute osteomyelitis, ankle and foot   Coronary artery disease   Essential hypertension   Hyperlipidemia   Gastroparesis   Vomiting   Discharge Condition: improved  Diet recommendation: carb mod  Filed Weights   12/21/13 0120  Weight: 137.757 kg (303 lb 11.2 oz)    History of present illness:  Samuel [name] is a 63 y.o. male with a history of IDDM and Gastroparesis who presented to the ED with complaints of severe nausea and vomiting over the past 4-5 days. He reports not being able to hold down foods or liquids. He denies having any diarrhea, or constipation or fevers or chills   Hospital Course:  Diabetic Gastroparesis -Patient with history of diabetic gastroparesis, having a Gastric Emptying Scan on 07/03/2010 that showed delayed solid phase gastric emptying suggesting gastroparesis  -Presenting with intractible nausea/vomiting  -Reglan 10 mg  -patient states he has long standing gastroparesis  Type II Diabetes Mellitus  -Blood sugars are stable, CBG's in the 100s-150s  -sliding scale coverage at home---  Dyslipidemia.  -On statin therapy   HTN  -Blood pressures are stable, with SBP's in the 110's to 130's. Of medications     Procedures:    Consultations:    Discharge Exam: Filed Vitals:   12/23/13 0527  BP: 115/75  Pulse: 75  Temp: 97.2 F (36.2 C)  Resp: 18    General: A+OX3, NAD Cardiovascular: rrr Respiratory: clear  Discharge  Instructions You were cared for by a hospitalist during your hospital stay. If you have any questions about your discharge medications or the care you received while you were in the hospital after you are discharged, you can call the unit and asked to speak with the hospitalist on call if the hospitalist that took care of you is not available. Once you are discharged, your primary care physician will handle any further medical issues. Please note that NO REFILLS for any discharge medications will be authorized once you are discharged, as it is imperative that you return to your primary care physician (or establish a relationship with a primary care physician if you do not have one) for your aftercare needs so that they can reassess your need for medications and monitor your lab values.  Discharge Instructions   Diet - low sodium heart healthy    Complete by:  As directed      Diet Carb Modified    Complete by:  As directed      Discharge instructions    Complete by:  As directed   Monitor blood sugars and bring to PCP STRICT DIABETIC diet- not requiring levemir here     Increase activity slowly    Complete by:  As directed             Medication List    STOP taking these medications       LEVEMIR 100 UNIT/ML injection  Generic drug:  insulin detemir     quinapril 5 MG tablet  Commonly known as:  ACCUPRIL      TAKE these medications       acetaminophen 500 MG tablet  Commonly known as:  TYLENOL  Take 500 mg by mouth every 8 (eight) hours as needed for moderate pain.     aspirin 81 MG chewable tablet  Chew 81 mg by mouth at bedtime.     COLACE 100 MG capsule  Generic drug:  docusate sodium  Take 100 mg by mouth every evening.     EQ PAIN RELIEVER PM 25-500 MG Tabs  Generic drug:  diphenhydramine-acetaminophen  Take 2 tablets by mouth at bedtime.     ferrous sulfate 325 (65 FE) MG tablet  Take 325 mg by mouth every evening.     gabapentin 300 MG capsule  Commonly known  as:  NEURONTIN  Take 300 mg by mouth 3 (three) times daily.     insulin lispro 100 UNIT/ML injection  Commonly known as:  HUMALOG  Inject 4-7 Units into the skin 3 (three) times daily before meals. Sliding scale     INVOKANA 100 MG Tabs  Generic drug:  Canagliflozin  Take 100 mg by mouth every evening.     lansoprazole 30 MG capsule  Commonly known as:  PREVACID  Take 30 mg by mouth 2 (two) times daily.     metoCLOPramide 10 MG tablet  Commonly known as:  REGLAN  Take 10 mg by mouth 4 (four) times daily.     naproxen sodium 220 MG tablet  Commonly known as:  ANAPROX  Take 220 mg by mouth 2 (two) times daily as needed (pain).     pioglitazone 30 MG tablet  Commonly known as:  ACTOS  Take 30 mg by mouth at bedtime.     pregabalin 300 MG capsule  Commonly known as:  LYRICA  Take 300 mg by mouth 3 (three) times daily.     promethazine 25 MG tablet  Commonly known as:  PHENERGAN  Take 1 tablet (25 mg total) by mouth every 6 (six) hours as needed for nausea.     promethazine 25 MG suppository  Commonly known as:  PHENERGAN  Place 1 suppository (25 mg total) rectally every 6 (six) hours as needed for nausea or vomiting.     ranitidine 150 MG tablet  Commonly known as:  ZANTAC  Take 150 mg by mouth daily.     simvastatin 40 MG tablet  Commonly known as:  ZOCOR  Take 60 mg by mouth every evening. Take 1 and 1/2 tablets daily     sitaGLIPtin 100 MG tablet  Commonly known as:  JANUVIA  Take 100 mg by mouth every evening.       Allergies  Allergen Reactions  . Codeine     REACTION: GI Upset  . Gabapentin     REACTION: rash/hives (per patient, was a reaction to an inactive ingredient in another mgf brand)   . Nabumetone     REACTION: sick on stomach and rash      The results of significant diagnostics from this hospitalization (including imaging, microbiology, ancillary and laboratory) are listed below for reference.    Significant Diagnostic Studies: No results  found.  Microbiology: No results found for this or any previous visit (from the past 240 hour(s)).   Labs: Basic Metabolic Panel:  Recent Labs Lab 12/20/13 1724 12/21/13 0107 12/21/13 0530 12/22/13 0500 12/23/13 0522  NA 135* 136* 139 141 137  K 4.7 4.7 4.3 3.9 3.8  CL 96 95* 100 103  102  CO2 17* 16* 16* 17* 20  GLUCOSE 161* 194* 143* 93 112*  BUN 27* 27* 26* 24* 19  CREATININE 1.16 1.23 1.15 1.12 0.93  CALCIUM 10.2 9.7 9.3 9.2 9.2   Liver Function Tests:  Recent Labs Lab 12/20/13 1724  AST 24  ALT 21  ALKPHOS 60  BILITOT 0.6  PROT 8.3  ALBUMIN 4.1    Recent Labs Lab 12/20/13 1724  LIPASE 18   No results found for this basename: AMMONIA,  in the last 168 hours CBC:  Recent Labs Lab 12/20/13 1724 12/21/13 0530 12/22/13 0500 12/23/13 0522  WBC 7.4 7.8 6.1 5.8  NEUTROABS 5.6  --   --   --   HGB 16.1 14.4 13.4 13.6  HCT 47.0 43.0 40.6 40.5  MCV 91.1 91.9 92.9 91.4  PLT 206 182 175 159   Cardiac Enzymes: No results found for this basename: CKTOTAL, CKMB, CKMBINDEX, TROPONINI,  in the last 168 hours BNP: BNP (last 3 results) No results found for this basename: PROBNP,  in the last 8760 hours CBG:  Recent Labs Lab 12/22/13 1638 12/22/13 2010 12/23/13 0003 12/23/13 0402 12/23/13 0734  GLUCAP 107* 118* 98 102* 102*       Signed:  VANN, JESSICA  [facility] 12/23/2013, 9:13 AM

## 2014-01-05 ENCOUNTER — Encounter (HOSPITAL_COMMUNITY): Payer: Self-pay

## 2014-01-05 ENCOUNTER — Encounter (HOSPITAL_COMMUNITY)
Admission: RE | Admit: 2014-01-05 | Discharge: 2014-01-05 | Disposition: A | Discharge status: Home / Self Care | Payer: Medicare Other | Source: Ambulatory Visit | Attending: Orthopedic Surgery | Admitting: Orthopedic Surgery

## 2014-01-05 ENCOUNTER — Encounter (HOSPITAL_COMMUNITY)
Admission: RE | Admit: 2014-01-05 | Discharge: 2014-01-05 | Disposition: A | Discharge status: Home / Self Care | Payer: Medicare Other | Source: Ambulatory Visit | Attending: Anesthesiology | Admitting: Anesthesiology

## 2014-01-05 HISTORY — DX: Shortness of breath: R06.02

## 2014-01-05 HISTORY — DX: Anxiety disorder, unspecified: F41.9

## 2014-01-05 HISTORY — DX: Unspecified osteoarthritis, unspecified site: M19.90

## 2014-01-05 HISTORY — DX: Peripheral vascular disease, unspecified: I73.9

## 2014-01-05 HISTORY — DX: Depression, unspecified: F32.A

## 2014-01-05 HISTORY — DX: Major depressive disorder, single episode, unspecified: F32.9

## 2014-01-05 HISTORY — DX: Nausea with vomiting, unspecified: R11.2

## 2014-01-05 HISTORY — DX: Other pulmonary embolism without acute cor pulmonale: I26.99

## 2014-01-05 HISTORY — DX: Other specified postprocedural states: Z98.890

## 2014-01-05 LAB — BASIC METABOLIC PANEL
ANION GAP: 14 (ref 5–15)
BUN: 18 mg/dL (ref 6–23)
CALCIUM: 9.5 mg/dL (ref 8.4–10.5)
CO2: 24 mEq/L (ref 19–32)
Chloride: 102 mEq/L (ref 96–112)
Creatinine, Ser: 1.19 mg/dL (ref 0.50–1.35)
GFR calc non Af Amer: 65 mL/min — ABNORMAL LOW (ref >90)
GFR, EST AFRICAN AMERICAN: 75 mL/min — AB (ref >90)
Glucose, Bld: 155 mg/dL — ABNORMAL HIGH (ref 70–99)
POTASSIUM: 5.1 meq/L (ref 3.7–5.3)
SODIUM: 140 meq/L (ref 137–147)

## 2014-01-05 LAB — CBC
HCT: 45.5 % (ref 39.0–52.0)
Hemoglobin: 14.9 g/dL (ref 13.0–17.0)
MCH: 31.2 pg (ref 26.0–34.0)
MCHC: 32.7 g/dL (ref 30.0–36.0)
MCV: 95.2 fL (ref 78.0–100.0)
Platelets: 190 10*3/uL (ref 150–400)
RBC: 4.78 MIL/uL (ref 4.22–5.81)
RDW: 13.8 % (ref 11.5–15.5)
WBC: 6 10*3/uL (ref 4.0–10.5)

## 2014-01-05 NOTE — Pre-Procedure Instructions (Signed)
Samuel BusingJohn D [name]  01/05/2014   Your procedure is scheduled on:  01-07-2014  Thursday   [facility] at 11:00 AM   Call this number if you have problems the morning of surgery: [phone]   Remember:    Do not eat food or drink liquids after midnight.    Take these medicines the morning of surgery with A SIP OF WATER: gabapentin(Neurontin),lansopzole(Prevacid),Metoclopramide(Reglan),ranitidine(Zantac),   Do not wear jewelry  Do not wear lotions, powders, or perfumes.   Do not shave 48 hours prior to surgery. Men may shave face and neck.  Do not bring valuables to the hospital.  [facility] any belongings or valuables.               Contacts, dentures or bridgework may not be worn into surgery.   Leave suitcase in the car. After surgery it may be brought to your room.  For patients admitted to the hospital, discharge time is determined by your  treatment team.               Patients discharged the day of surgery will not be allowed to drive home.    Special Instructions: See attached Sheet for instructions on CHG bath/shower   Please read over the following fact sheets that you were given: Pain Booklet, Coughing and Deep Breathing and Surgical Site Infection Prevention

## 2014-01-05 NOTE — Progress Notes (Signed)
Dr.Hewitt's office called for orders.

## 2014-01-06 ENCOUNTER — Other Ambulatory Visit: Payer: Self-pay | Admitting: Orthopedic Surgery

## 2014-01-06 NOTE — Progress Notes (Signed)
Anesthesia Chart Review: Patient is a 63 year old [name] posted for left leg removal of deep implant and sequestrectomy, possible placement of wound VAC tomorrow by Dr. Hewitt.    History includes non-smoker, post-operative N/V, HTN, DM2 with peripheral neuropathy and gastroparesis (with hospitalization 12/21/2013), CAD s/p DES mid LAD 06/14/09, HLD, GERD, OSA without CPAP use, anxiety, depression, cholecystectomy, RLE DVT and bilateral PE 02/2011 s/p IVC filter s/p retrieval 05/22/11, osteomyelitis and Charcot foot, PVD s/p right toe amputation. BMI is consistent with obesity. PCP is Dr. James Kim.  Cardiologist is Dr. Jonathan Berry, last visit 05/01/13.  Patient had no CP or SOB at that time and no cardiac testing was ordered with one year follow-up recommended.     EKG on 12/20/13 showed: SR, borderline low voltage in limb leads, borderline prolonged QT interval. Question of single PVC versus artifact. Q waves in lead III, appear insignificant in aVF.  Overall, I think his EKG is stable since 05/01/13.   Echo on 03/14/11 showed: Ventricle: The. Normal LV cavity size. Mild concentric LVH. Normal LV systolic function. Estimated LVEF 55-60%. Normal wall motion. No regional wall motion abnormalities. Doppler parameters consistent with abnormal left ventricular relaxation (grade 1 diastolic dysfunction). The E/e' ratio < 10, suggesting normal LV filling pressure. Aortic valve is trileaflet. Calcified mitral valve annulus. Mildly dilated right ventricle cavity size. Mildly dilated right atrium. Mild tricuspid regurgitation. PA peak pressure 36 mmHg. Trivial pericardial effusion.  According to cardiology notes, "He had a Myoview stress test performed 08/24/09 which was low risk and nonischemic."  Cardiac cath on 06/14/09 showed: Normal LM. Fairly focal 90% stenosis in the mid LAD followed by a 30-40% stenosis just distal to this. 50% concentric lesion in the distal AV groove just before the posterior lateral branch.  OM2 is moderate size with 30% proximal stenosis. RCA was probably codominant with a 40% segmental stenosis in the proximal as well as a 75% fairly focal stenosis in the proximal PDA. Plan: PTCA/DES to mid LAD. The PDA lesion was a small, 81mmKJuliTrixie EReSavoKorea83monKJuliTrixReHanKorea74movKJuliTrixie WestReKorea3mTaKJuliTrixie West LitReStotts Korea97mCiKJuliTrixie MRePKorea51mowKJuliTrixieReCochranvKorea3milKJuliTrixie SReBayfKorea23mieKJuliTrixie ReSandKorea43movKJuliTrixie East ReKaKorea80mmiKJuliTrixie NorReBelspKorea34mriKJuliTrixie SReBKorea89mruKJuliTriReLittle SiKorea14mlvKJuliTriReMayvKorea38milKJuliTrixie ReMarKorea45mmoKJuliTrixie ReLake MoKorea23mhaKJuliTrixReCobbKorea63mtoKJuliTrixieReOroKorea78mfiKJuliTrixieReBismKorea79marKJuliTrixieReDecKorea101matKJuliTrixie MarioReEast SKorea13malKJuliTrReMount VeKorea22mrnKJuliTrixiReThorneKorea65m BKJuliTrixie FoReWyoKorea20mmiKJuliTrixie BedReDisputKorea20manKJuliTrixie MounReHillKorea34mdaKJuliTrixie ChReLadera HeiKorea35mghKJuliTrixie ReMeadvKorea32milKJuliTrixie ReWaterKorea45mflKJuliTrixie TReBraddock HKorea17milKJuliTrRePittKorea33mboKJuliTrixiReBuchananKorea Damhayonidethouseso no PCI ntervention was recommended.   Preoperative CXR and labs noted.   Patient was seen by his cardiologist within the past year with one year follow-up recommended.  I think his EKG appears stable. No chest pain or SOB at rest documented from his PAT visit.  He does have issues with diabetic gastroparesis, but was instructed to take Reglan on the day of surgery. He also has a history of PE in 2012 but is no longer on anti-coagulation therapy.  Will defer DVT prophylaxis orders to Dr. Hewitt.  Patient will be evaluated by his assigned anesthesiologist on the day of surgery.  If no acute changes or new worrisome CV symptoms then I would anticipate that he could proceed as planned.     Cleone Hulick, PA-C [facility]/Anesthesiology Phone [phone] 01/06/2014 10:00 AM

## 2014-01-07 ENCOUNTER — Encounter (HOSPITAL_COMMUNITY): Payer: Self-pay | Admitting: *Deleted

## 2014-01-07 ENCOUNTER — Encounter (HOSPITAL_COMMUNITY)
Admission: RE | Disposition: A | Discharge status: Home / Self Care | Payer: Self-pay | Source: Ambulatory Visit | Attending: Orthopedic Surgery

## 2014-01-07 ENCOUNTER — Inpatient Hospital Stay (HOSPITAL_COMMUNITY): Payer: Medicare Other | Admitting: Certified Registered"

## 2014-01-07 ENCOUNTER — Encounter (HOSPITAL_COMMUNITY): Payer: Medicare Other | Admitting: Vascular Surgery

## 2014-01-07 ENCOUNTER — Inpatient Hospital Stay (HOSPITAL_COMMUNITY)
Admission: RE | Admit: 2014-01-07 | Discharge: 2014-01-11 | DRG: 493 | Disposition: A | Discharge status: Home / Self Care | Payer: Medicare Other | Source: Ambulatory Visit | Attending: Orthopedic Surgery | Admitting: Orthopedic Surgery

## 2014-01-07 DIAGNOSIS — Z8249 Family history of ischemic heart disease and other diseases of the circulatory system: Secondary | ICD-10-CM

## 2014-01-07 DIAGNOSIS — E1121 Type 2 diabetes mellitus with diabetic nephropathy: Secondary | ICD-10-CM

## 2014-01-07 DIAGNOSIS — E1169 Type 2 diabetes mellitus with other specified complication: Secondary | ICD-10-CM | POA: Diagnosis present

## 2014-01-07 DIAGNOSIS — E119 Type 2 diabetes mellitus without complications: Secondary | ICD-10-CM | POA: Diagnosis present

## 2014-01-07 DIAGNOSIS — Z801 Family history of malignant neoplasm of trachea, bronchus and lung: Secondary | ICD-10-CM | POA: Diagnosis not present

## 2014-01-07 DIAGNOSIS — Z9861 Coronary angioplasty status: Secondary | ICD-10-CM | POA: Diagnosis not present

## 2014-01-07 DIAGNOSIS — E1149 Type 2 diabetes mellitus with other diabetic neurological complication: Secondary | ICD-10-CM | POA: Diagnosis present

## 2014-01-07 DIAGNOSIS — L97909 Non-pressure chronic ulcer of unspecified part of unspecified lower leg with unspecified severity: Secondary | ICD-10-CM | POA: Diagnosis present

## 2014-01-07 DIAGNOSIS — Z6841 Body Mass Index (BMI) 40.0 and over, adult: Secondary | ICD-10-CM | POA: Diagnosis not present

## 2014-01-07 DIAGNOSIS — A4901 Methicillin susceptible Staphylococcus aureus infection, unspecified site: Secondary | ICD-10-CM | POA: Diagnosis present

## 2014-01-07 DIAGNOSIS — I739 Peripheral vascular disease, unspecified: Secondary | ICD-10-CM | POA: Diagnosis present

## 2014-01-07 DIAGNOSIS — M86179 Other acute osteomyelitis, unspecified ankle and foot: Secondary | ICD-10-CM

## 2014-01-07 DIAGNOSIS — G4733 Obstructive sleep apnea (adult) (pediatric): Secondary | ICD-10-CM | POA: Diagnosis present

## 2014-01-07 DIAGNOSIS — M86662 Other chronic osteomyelitis, left tibia and fibula: Secondary | ICD-10-CM

## 2014-01-07 DIAGNOSIS — E785 Hyperlipidemia, unspecified: Secondary | ICD-10-CM | POA: Diagnosis present

## 2014-01-07 DIAGNOSIS — K219 Gastro-esophageal reflux disease without esophagitis: Secondary | ICD-10-CM

## 2014-01-07 DIAGNOSIS — Z7982 Long term (current) use of aspirin: Secondary | ICD-10-CM | POA: Diagnosis not present

## 2014-01-07 DIAGNOSIS — L97809 Non-pressure chronic ulcer of other part of unspecified lower leg with unspecified severity: Secondary | ICD-10-CM | POA: Diagnosis present

## 2014-01-07 DIAGNOSIS — I10 Essential (primary) hypertension: Secondary | ICD-10-CM | POA: Diagnosis present

## 2014-01-07 DIAGNOSIS — Z86711 Personal history of pulmonary embolism: Secondary | ICD-10-CM

## 2014-01-07 DIAGNOSIS — E1129 Type 2 diabetes mellitus with other diabetic kidney complication: Secondary | ICD-10-CM

## 2014-01-07 DIAGNOSIS — Z794 Long term (current) use of insulin: Secondary | ICD-10-CM | POA: Diagnosis not present

## 2014-01-07 DIAGNOSIS — K3184 Gastroparesis: Secondary | ICD-10-CM | POA: Diagnosis present

## 2014-01-07 DIAGNOSIS — I2510 Atherosclerotic heart disease of native coronary artery without angina pectoris: Secondary | ICD-10-CM | POA: Diagnosis present

## 2014-01-07 DIAGNOSIS — M86669 Other chronic osteomyelitis, unspecified tibia and fibula: Secondary | ICD-10-CM | POA: Diagnosis present

## 2014-01-07 DIAGNOSIS — E669 Obesity, unspecified: Secondary | ICD-10-CM | POA: Diagnosis present

## 2014-01-07 DIAGNOSIS — N058 Unspecified nephritic syndrome with other morphologic changes: Secondary | ICD-10-CM

## 2014-01-07 DIAGNOSIS — E1143 Type 2 diabetes mellitus with diabetic autonomic (poly)neuropathy: Secondary | ICD-10-CM | POA: Diagnosis present

## 2014-01-07 DIAGNOSIS — M86661 Other chronic osteomyelitis, right tibia and fibula: Secondary | ICD-10-CM

## 2014-01-07 HISTORY — DX: Obstructive sleep apnea (adult) (pediatric): G47.33

## 2014-01-07 HISTORY — PX: I&D EXTREMITY: SHX5045

## 2014-01-07 HISTORY — PX: APPLICATION OF WOUND VAC: SHX5189

## 2014-01-07 HISTORY — DX: Personal history of other diseases of the digestive system: Z87.19

## 2014-01-07 HISTORY — DX: Type 2 diabetes mellitus without complications: E11.9

## 2014-01-07 HISTORY — PX: HARDWARE REMOVAL: SHX979

## 2014-01-07 HISTORY — PX: TIBIAL IM ROD REMOVAL: SHX2521

## 2014-01-07 HISTORY — PX: WOUND DEBRIDEMENT: SHX247

## 2014-01-07 HISTORY — DX: Charcot's joint, unspecified site: M14.60

## 2014-01-07 LAB — GLUCOSE, CAPILLARY
GLUCOSE-CAPILLARY: 132 mg/dL — AB (ref 70–99)
GLUCOSE-CAPILLARY: 94 mg/dL (ref 70–99)
Glucose-Capillary: 113 mg/dL — ABNORMAL HIGH (ref 70–99)

## 2014-01-07 SURGERY — REMOVAL, HARDWARE
Anesthesia: General | Site: Leg Lower | Laterality: Left

## 2014-01-07 MED ORDER — ONDANSETRON HCL 4 MG/2ML IJ SOLN
4.0000 mg | Freq: Four times a day (QID) | INTRAMUSCULAR | Status: DC | PRN
  Administered 2014-01-08: 4 mg via INTRAVENOUS
  Filled 2014-01-07: qty 2

## 2014-01-07 MED ORDER — MORPHINE SULFATE 2 MG/ML IJ SOLN
1.0000 mg | INTRAMUSCULAR | Status: DC | PRN
  Administered 2014-01-08 (×3): 1 mg via INTRAVENOUS
  Filled 2014-01-07 (×4): qty 1

## 2014-01-07 MED ORDER — CHLORHEXIDINE GLUCONATE 4 % EX LIQD
60.0000 mL | Freq: Once | CUTANEOUS | Status: DC
  Filled 2014-01-07: qty 60

## 2014-01-07 MED ORDER — SODIUM CHLORIDE 0.9 % IV SOLN
1250.0000 mg | Freq: Two times a day (BID) | INTRAVENOUS | Status: DC
  Administered 2014-01-07 – 2014-01-10 (×7): 1250 mg via INTRAVENOUS
  Filled 2014-01-07 (×11): qty 1250

## 2014-01-07 MED ORDER — SODIUM CHLORIDE 0.9 % IV SOLN: INTRAVENOUS | Status: DC

## 2014-01-07 MED ORDER — ACETAMINOPHEN 500 MG PO TABS
ORAL_TABLET | ORAL | Status: AC
  Administered 2014-01-07: 1000 mg via ORAL
  Filled 2014-01-07: qty 2

## 2014-01-07 MED ORDER — PROMETHAZINE HCL 25 MG PO TABS
25.0000 mg | ORAL_TABLET | Freq: Four times a day (QID) | ORAL | Status: DC | PRN
  Administered 2014-01-08 (×2): 25 mg via ORAL
  Filled 2014-01-07 (×2): qty 1

## 2014-01-07 MED ORDER — INSULIN ASPART 100 UNIT/ML ~~LOC~~ SOLN: 0.0000 [IU] | Freq: Every day | SUBCUTANEOUS | Status: DC

## 2014-01-07 MED ORDER — FENTANYL CITRATE 0.05 MG/ML IJ SOLN
INTRAMUSCULAR | Status: DC | PRN
  Administered 2014-01-07: 50 ug via INTRAVENOUS
  Administered 2014-01-07: 100 ug via INTRAVENOUS

## 2014-01-07 MED ORDER — ONDANSETRON HCL 4 MG PO TABS: 4.0000 mg | ORAL_TABLET | Freq: Four times a day (QID) | ORAL | Status: DC | PRN

## 2014-01-07 MED ORDER — ONDANSETRON HCL 4 MG/2ML IJ SOLN
INTRAMUSCULAR | Status: AC
  Administered 2014-01-07: 4 mg via INTRAVENOUS
  Filled 2014-01-07: qty 2

## 2014-01-07 MED ORDER — 0.9 % SODIUM CHLORIDE (POUR BTL) OPTIME
TOPICAL | Status: DC | PRN
  Administered 2014-01-07: 1000 mL

## 2014-01-07 MED ORDER — LACTATED RINGERS IV SOLN
INTRAVENOUS | Status: DC | PRN
  Administered 2014-01-07 (×2): via INTRAVENOUS

## 2014-01-07 MED ORDER — ONDANSETRON HCL 4 MG/2ML IJ SOLN
INTRAMUSCULAR | Status: AC
  Filled 2014-01-07: qty 2

## 2014-01-07 MED ORDER — LIDOCAINE HCL 1 % IJ SOLN
INTRAMUSCULAR | Status: DC | PRN
  Administered 2014-01-07: 100 mg via INTRADERMAL

## 2014-01-07 MED ORDER — ROCURONIUM BROMIDE 50 MG/5ML IV SOLN
INTRAVENOUS | Status: AC
  Filled 2014-01-07: qty 1

## 2014-01-07 MED ORDER — LINAGLIPTIN 5 MG PO TABS
5.0000 mg | ORAL_TABLET | Freq: Every day | ORAL | Status: DC
Start: 2014-01-08 — End: 2014-01-11
  Administered 2014-01-08 – 2014-01-11 (×4): 5 mg via ORAL
  Filled 2014-01-07 (×4): qty 1

## 2014-01-07 MED ORDER — PROMETHAZINE HCL 25 MG RE SUPP: 25.0000 mg | Freq: Four times a day (QID) | RECTAL | Status: DC | PRN

## 2014-01-07 MED ORDER — LACTATED RINGERS IV SOLN
INTRAVENOUS | Status: DC
  Administered 2014-01-07: 12:00:00 via INTRAVENOUS

## 2014-01-07 MED ORDER — ONDANSETRON HCL 4 MG/2ML IJ SOLN
INTRAMUSCULAR | Status: AC
  Administered 2014-01-07: 4 mg
  Filled 2014-01-07: qty 2

## 2014-01-07 MED ORDER — FENTANYL CITRATE 0.05 MG/ML IJ SOLN
INTRAMUSCULAR | Status: AC
  Filled 2014-01-07: qty 5

## 2014-01-07 MED ORDER — SCOPOLAMINE 1 MG/3DAYS TD PT72
MEDICATED_PATCH | TRANSDERMAL | Status: AC
  Filled 2014-01-07: qty 1

## 2014-01-07 MED ORDER — FERROUS SULFATE 325 (65 FE) MG PO TABS
325.0000 mg | ORAL_TABLET | Freq: Every evening | ORAL | Status: DC
  Administered 2014-01-07 – 2014-01-10 (×4): 325 mg via ORAL
  Filled 2014-01-07 (×5): qty 1

## 2014-01-07 MED ORDER — HYDROMORPHONE HCL PF 1 MG/ML IJ SOLN: 0.2500 mg | INTRAMUSCULAR | Status: DC | PRN

## 2014-01-07 MED ORDER — ARTIFICIAL TEARS OP OINT
TOPICAL_OINTMENT | OPHTHALMIC | Status: AC
  Filled 2014-01-07: qty 3.5

## 2014-01-07 MED ORDER — ONDANSETRON HCL 4 MG/2ML IJ SOLN: 4.0000 mg | Freq: Once | INTRAMUSCULAR | Status: DC | PRN

## 2014-01-07 MED ORDER — METOCLOPRAMIDE HCL 10 MG PO TABS
10.0000 mg | ORAL_TABLET | Freq: Three times a day (TID) | ORAL | Status: DC
  Administered 2014-01-07 – 2014-01-11 (×15): 10 mg via ORAL
  Filled 2014-01-07 (×19): qty 1

## 2014-01-07 MED ORDER — PANTOPRAZOLE SODIUM 40 MG PO TBEC
40.0000 mg | DELAYED_RELEASE_TABLET | Freq: Every day | ORAL | Status: DC
  Administered 2014-01-08 – 2014-01-11 (×4): 40 mg via ORAL
  Filled 2014-01-07 (×4): qty 1

## 2014-01-07 MED ORDER — LIDOCAINE HCL (CARDIAC) 20 MG/ML IV SOLN
INTRAVENOUS | Status: AC
  Filled 2014-01-07: qty 5

## 2014-01-07 MED ORDER — VANCOMYCIN HCL 500 MG IV SOLR
INTRAVENOUS | Status: AC
  Filled 2014-01-07: qty 500

## 2014-01-07 MED ORDER — SODIUM CHLORIDE 0.9 % IV SOLN
INTRAVENOUS | Status: DC
  Administered 2014-01-08: 05:00:00 via INTRAVENOUS

## 2014-01-07 MED ORDER — PREGABALIN 75 MG PO CAPS: 300.0000 mg | ORAL_CAPSULE | Freq: Three times a day (TID) | ORAL | Status: DC

## 2014-01-07 MED ORDER — CANAGLIFLOZIN 100 MG PO TABS
100.0000 mg | ORAL_TABLET | Freq: Every evening | ORAL | Status: DC
  Administered 2014-01-07 – 2014-01-10 (×4): 100 mg via ORAL
  Filled 2014-01-07 (×5): qty 1

## 2014-01-07 MED ORDER — VANCOMYCIN HCL 1000 MG IV SOLR
INTRAVENOUS | Status: AC
  Filled 2014-01-07: qty 1000

## 2014-01-07 MED ORDER — HYDROMORPHONE HCL PF 1 MG/ML IJ SOLN
0.2500 mg | INTRAMUSCULAR | Status: DC | PRN
  Administered 2014-01-07 (×2): 0.5 mg via INTRAVENOUS

## 2014-01-07 MED ORDER — DOCUSATE SODIUM 100 MG PO CAPS
100.0000 mg | ORAL_CAPSULE | Freq: Every evening | ORAL | Status: DC
  Administered 2014-01-07 – 2014-01-10 (×4): 100 mg via ORAL
  Filled 2014-01-07 (×4): qty 1

## 2014-01-07 MED ORDER — ACETAMINOPHEN 500 MG PO TABS
1000.0000 mg | ORAL_TABLET | Freq: Once | ORAL | Status: AC
  Administered 2014-01-07: 1000 mg via ORAL

## 2014-01-07 MED ORDER — HYDROCODONE-ACETAMINOPHEN 5-325 MG PO TABS
1.0000 | ORAL_TABLET | Freq: Four times a day (QID) | ORAL | Status: DC | PRN
Start: 2014-01-07 — End: 2014-01-11
  Administered 2014-01-08 – 2014-01-11 (×7): 2 via ORAL
  Filled 2014-01-07 (×7): qty 2

## 2014-01-07 MED ORDER — SUCCINYLCHOLINE CHLORIDE 20 MG/ML IJ SOLN
INTRAMUSCULAR | Status: DC | PRN
  Administered 2014-01-07: 120 mg via INTRAVENOUS

## 2014-01-07 MED ORDER — PROPOFOL 10 MG/ML IV BOLUS
INTRAVENOUS | Status: AC
  Filled 2014-01-07: qty 20

## 2014-01-07 MED ORDER — ONDANSETRON HCL 4 MG/2ML IJ SOLN: 4.0000 mg | Freq: Four times a day (QID) | INTRAMUSCULAR | Status: DC | PRN

## 2014-01-07 MED ORDER — GENTAMICIN SULFATE 40 MG/ML IJ SOLN
INTRAMUSCULAR | Status: DC | PRN
  Administered 2014-01-07 (×2): 80 mg

## 2014-01-07 MED ORDER — HYDROMORPHONE HCL PF 1 MG/ML IJ SOLN
INTRAMUSCULAR | Status: AC
  Filled 2014-01-07: qty 1

## 2014-01-07 MED ORDER — OXYCODONE HCL 5 MG PO TABS: 5.0000 mg | ORAL_TABLET | Freq: Once | ORAL | Status: DC | PRN

## 2014-01-07 MED ORDER — SIMVASTATIN 40 MG PO TABS
60.0000 mg | ORAL_TABLET | Freq: Every evening | ORAL | Status: DC
  Administered 2014-01-07 – 2014-01-10 (×4): 60 mg via ORAL
  Filled 2014-01-07 (×5): qty 1
  Filled 2014-01-07: qty 2
  Filled 2014-01-07: qty 1

## 2014-01-07 MED ORDER — GABAPENTIN 300 MG PO CAPS
300.0000 mg | ORAL_CAPSULE | Freq: Three times a day (TID) | ORAL | Status: DC
  Administered 2014-01-07 – 2014-01-09 (×7): 300 mg via ORAL
  Filled 2014-01-07 (×7): qty 1

## 2014-01-07 MED ORDER — GENTAMICIN SULFATE 40 MG/ML IJ SOLN
INTRAMUSCULAR | Status: AC
  Filled 2014-01-07: qty 6

## 2014-01-07 MED ORDER — OXYCODONE HCL 5 MG/5ML PO SOLN: 5.0000 mg | Freq: Once | ORAL | Status: DC | PRN

## 2014-01-07 MED ORDER — PROMETHAZINE HCL 25 MG/ML IJ SOLN
INTRAMUSCULAR | Status: AC
  Administered 2014-01-07: 112.5 mg
  Filled 2014-01-07: qty 1

## 2014-01-07 MED ORDER — VANCOMYCIN HCL 500 MG IV SOLR
INTRAVENOUS | Status: DC | PRN
  Administered 2014-01-07: 500 mg

## 2014-01-07 MED ORDER — ONDANSETRON HCL 4 MG/2ML IJ SOLN
INTRAMUSCULAR | Status: DC | PRN
  Administered 2014-01-07: 4 mg via INTRAVENOUS

## 2014-01-07 MED ORDER — PROPOFOL 10 MG/ML IV BOLUS
INTRAVENOUS | Status: DC | PRN
  Administered 2014-01-07: 200 mg via INTRAVENOUS

## 2014-01-07 MED ORDER — SENNA 8.6 MG PO TABS
2.0000 | ORAL_TABLET | Freq: Two times a day (BID) | ORAL | Status: DC
  Administered 2014-01-07 – 2014-01-10 (×7): 17.2 mg via ORAL
  Filled 2014-01-07 (×10): qty 2

## 2014-01-07 MED ORDER — INSULIN ASPART 100 UNIT/ML ~~LOC~~ SOLN
0.0000 [IU] | Freq: Three times a day (TID) | SUBCUTANEOUS | Status: DC
  Administered 2014-01-08 – 2014-01-10 (×4): 2 [IU] via SUBCUTANEOUS
  Administered 2014-01-10 (×2): 3 [IU] via SUBCUTANEOUS
  Administered 2014-01-11 (×2): 2 [IU] via SUBCUTANEOUS

## 2014-01-07 MED ORDER — SODIUM CHLORIDE 0.9 % IR SOLN
Status: DC | PRN
  Administered 2014-01-07: 3000 mL

## 2014-01-07 MED ORDER — PIPERACILLIN-TAZOBACTAM 3.375 G IVPB
3.3750 g | Freq: Three times a day (TID) | INTRAVENOUS | Status: DC
  Administered 2014-01-07 – 2014-01-09 (×6): 3.375 g via INTRAVENOUS
  Filled 2014-01-07 (×8): qty 50

## 2014-01-07 MED ORDER — GLYCOPYRROLATE 0.2 MG/ML IJ SOLN
INTRAMUSCULAR | Status: AC
  Filled 2014-01-07: qty 2

## 2014-01-07 MED ORDER — VANCOMYCIN HCL 1000 MG IV SOLR
1000.0000 mg | INTRAVENOUS | Status: DC | PRN
  Administered 2014-01-07: 1500 mg via INTRAVENOUS

## 2014-01-07 MED ORDER — ONDANSETRON HCL 4 MG/2ML IJ SOLN
4.0000 mg | Freq: Once | INTRAMUSCULAR | Status: AC
  Administered 2014-01-07: 4 mg via INTRAVENOUS

## 2014-01-07 MED ORDER — MIDAZOLAM HCL 2 MG/2ML IJ SOLN
INTRAMUSCULAR | Status: AC
  Filled 2014-01-07: qty 2

## 2014-01-07 MED ORDER — ACETAMINOPHEN 500 MG PO TABS
500.0000 mg | ORAL_TABLET | Freq: Three times a day (TID) | ORAL | Status: DC | PRN
  Administered 2014-01-10: 500 mg via ORAL
  Filled 2014-01-07: qty 1

## 2014-01-07 MED ORDER — VANCOMYCIN HCL 10 G IV SOLR
1500.0000 mg | INTRAVENOUS | Status: DC
  Filled 2014-01-07: qty 1500

## 2014-01-07 MED ORDER — PIOGLITAZONE HCL 30 MG PO TABS
30.0000 mg | ORAL_TABLET | Freq: Every day | ORAL | Status: DC
  Administered 2014-01-07 – 2014-01-09 (×3): 30 mg via ORAL
  Filled 2014-01-07 (×6): qty 1

## 2014-01-07 MED ORDER — FAMOTIDINE 20 MG PO TABS
20.0000 mg | ORAL_TABLET | Freq: Every day | ORAL | Status: DC
Start: 2014-01-08 — End: 2014-01-11
  Administered 2014-01-08 – 2014-01-11 (×4): 20 mg via ORAL
  Filled 2014-01-07 (×5): qty 1

## 2014-01-07 MED ORDER — MIDAZOLAM HCL 5 MG/5ML IJ SOLN
INTRAMUSCULAR | Status: DC | PRN
  Administered 2014-01-07: 2 mg via INTRAVENOUS

## 2014-01-07 MED ORDER — ASPIRIN 81 MG PO CHEW
81.0000 mg | CHEWABLE_TABLET | Freq: Every day | ORAL | Status: DC
  Administered 2014-01-07 – 2014-01-10 (×4): 81 mg via ORAL
  Filled 2014-01-07 (×7): qty 1

## 2014-01-07 MED ORDER — SCOPOLAMINE 1 MG/3DAYS TD PT72
MEDICATED_PATCH | TRANSDERMAL | Status: DC | PRN
  Administered 2014-01-07: 1 via TRANSDERMAL

## 2014-01-07 SURGICAL SUPPLY — 67 items
BANDAGE ELASTIC 4 VELCRO ST LF (GAUZE/BANDAGES/DRESSINGS) ×3 IMPLANT
BANDAGE ESMARK 6X9 LF (GAUZE/BANDAGES/DRESSINGS) ×1 IMPLANT
BLADE SURG 10 STRL SS (BLADE) ×3 IMPLANT
BNDG CMPR 9X6 STRL LF SNTH (GAUZE/BANDAGES/DRESSINGS) ×1
BNDG COHESIVE 4X5 TAN STRL (GAUZE/BANDAGES/DRESSINGS) IMPLANT
BNDG COHESIVE 6X5 TAN STRL LF (GAUZE/BANDAGES/DRESSINGS) IMPLANT
BNDG ESMARK 6X9 LF (GAUZE/BANDAGES/DRESSINGS) ×3
CANISTER SUCT 3000ML (MISCELLANEOUS) ×3 IMPLANT
CANISTER WOUND CARE 500ML ATS (WOUND CARE) ×3 IMPLANT
CHLORAPREP W/TINT 26ML (MISCELLANEOUS) IMPLANT
CONT SPEC 4OZ CLIKSEAL STRL BL (MISCELLANEOUS) ×3 IMPLANT
COVER SURGICAL LIGHT HANDLE (MISCELLANEOUS) ×3 IMPLANT
CUFF TOURNIQUET SINGLE 34IN LL (TOURNIQUET CUFF) IMPLANT
CUFF TOURNIQUET SINGLE 44IN (TOURNIQUET CUFF) ×3 IMPLANT
DRAPE C-ARM 42X72 X-RAY (DRAPES) IMPLANT
DRAPE OEC MINIVIEW 54X84 (DRAPES) ×3 IMPLANT
DRAPE U-SHAPE 47X51 STRL (DRAPES) ×3 IMPLANT
DRSG ADAPTIC 3X8 NADH LF (GAUZE/BANDAGES/DRESSINGS) IMPLANT
DRSG MEPILEX BORDER 4X4 (GAUZE/BANDAGES/DRESSINGS) ×3 IMPLANT
DRSG MEPITEL 4X7.2 (GAUZE/BANDAGES/DRESSINGS) ×3 IMPLANT
DRSG PAD ABDOMINAL 8X10 ST (GAUZE/BANDAGES/DRESSINGS) IMPLANT
DRSG VAC ATS SM SENSATRAC (GAUZE/BANDAGES/DRESSINGS) ×3 IMPLANT
ELECT REM PT RETURN 9FT ADLT (ELECTROSURGICAL) ×3
ELECTRODE REM PT RTRN 9FT ADLT (ELECTROSURGICAL) ×1 IMPLANT
GLOVE BIO SURGEON STRL SZ7 (GLOVE) IMPLANT
GLOVE BIO SURGEON STRL SZ8 (GLOVE) ×6 IMPLANT
GLOVE BIOGEL PI IND STRL 7.0 (GLOVE) ×2 IMPLANT
GLOVE BIOGEL PI IND STRL 7.5 (GLOVE) ×1 IMPLANT
GLOVE BIOGEL PI IND STRL 8 (GLOVE) ×1 IMPLANT
GLOVE BIOGEL PI INDICATOR 7.0 (GLOVE) ×4
GLOVE BIOGEL PI INDICATOR 7.5 (GLOVE) ×2
GLOVE BIOGEL PI INDICATOR 8 (GLOVE) ×2
GLOVE SURG SS PI 7.0 STRL IVOR (GLOVE) ×6 IMPLANT
GOWN STRL REUS W/ TWL LRG LVL3 (GOWN DISPOSABLE) ×2 IMPLANT
GOWN STRL REUS W/ TWL XL LVL3 (GOWN DISPOSABLE) ×1 IMPLANT
GOWN STRL REUS W/TWL LRG LVL3 (GOWN DISPOSABLE) ×6
GOWN STRL REUS W/TWL XL LVL3 (GOWN DISPOSABLE) ×3
GUIDEWIRE BALL NOSE 80CM (WIRE) ×3 IMPLANT
KIT BASIN OR (CUSTOM PROCEDURE TRAY) ×3 IMPLANT
KIT ROOM TURNOVER OR (KITS) ×3 IMPLANT
KIT STIMULAN RAPID CURE 5CC (Orthopedic Implant) ×3 IMPLANT
NEEDLE 18GX1X1/2 (RX/OR ONLY) (NEEDLE) ×3 IMPLANT
NEEDLE 22X1 1/2 (OR ONLY) (NEEDLE) ×3 IMPLANT
NS IRRIG 1000ML POUR BTL (IV SOLUTION) ×3 IMPLANT
PACK ORTHO EXTREMITY (CUSTOM PROCEDURE TRAY) ×3 IMPLANT
PAD ARMBOARD 7.5X6 YLW CONV (MISCELLANEOUS) ×3 IMPLANT
PAD CAST 4YDX4 CTTN HI CHSV (CAST SUPPLIES) ×1 IMPLANT
PADDING CAST COTTON 4X4 STRL (CAST SUPPLIES) ×2
SET CYSTO W/LG BORE CLAMP LF (SET/KITS/TRAYS/PACK) ×3 IMPLANT
SOAP 2 % CHG 4 OZ (WOUND CARE) IMPLANT
SPONGE GAUZE 4X4 12PLY (GAUZE/BANDAGES/DRESSINGS) ×3 IMPLANT
SPONGE LAP 4X18 X RAY DECT (DISPOSABLE) IMPLANT
STAPLER VISISTAT 35W (STAPLE) IMPLANT
SUCTION FRAZIER TIP 10 FR DISP (SUCTIONS) ×3 IMPLANT
SUT ETHILON 2 0 FS 18 (SUTURE) ×6 IMPLANT
SUT PDS AB 2-0 CT1 27 (SUTURE) ×3 IMPLANT
SUT PROLENE 3 0 PS 2 (SUTURE) ×3 IMPLANT
SUT VIC AB 2-0 CT1 27 (SUTURE) ×6
SUT VIC AB 2-0 CT1 TAPERPNT 27 (SUTURE) ×2 IMPLANT
SUT VIC AB 3-0 PS2 18 (SUTURE) ×3
SUT VIC AB 3-0 PS2 18XBRD (SUTURE) ×1 IMPLANT
SYR CONTROL 10ML LL (SYRINGE) IMPLANT
TOWEL OR 17X24 6PK STRL BLUE (TOWEL DISPOSABLE) ×6 IMPLANT
TOWEL OR 17X26 10 PK STRL BLUE (TOWEL DISPOSABLE) ×3 IMPLANT
TUBE CONNECTING 12'X1/4 (SUCTIONS) ×1
TUBE CONNECTING 12X1/4 (SUCTIONS) ×2 IMPLANT
WATER STERILE IRR 1000ML POUR (IV SOLUTION) IMPLANT

## 2014-01-07 NOTE — Consult Note (Signed)
[facility]    Date of Admission:  01/07/2014          Reason for Consult: Chronic osteomyelitis of his left tibia    Referring Physician: Dr. Toni ArthursJohn Hewitt  Principal Problem:   Chronic osteomyelitis involving lower leg Active Problems:   OBSTRUCTIVE SLEEP APNEA   Ulcer of lower limb, unspecified   Gastroparesis   DM (diabetes mellitus), type 2   . chlorhexidine  60 mL Topical Once  . HYDROmorphone      . insulin aspart  0-15 Units Subcutaneous TID WC  . insulin aspart  0-5 Units Subcutaneous QHS  . vancomycin  1,500 mg Intravenous To OR    Recommendations: 1. IV vancomycin pending Gram stain and fungal cultures   Assessment: I suspect his chronic osteomyelitis is due to MSSA but he's had multiple procedures and has been on multiple different antibiotic regimen so I will treat him with vancomycin to cover both MSSA and MRSA pending Gram stain and cultures. I will followup in the morning.    HPI: Samuel [name] is a 63 y.o. male with diabetes mellitus and Charcot arthropathy who developed MSSA osteomyelitis at the site of a tibial pin that was in from June until September of last year when he had an external fixator on after left midfoot fusion. He received a course of IV and oral antibiotics from July until October of 2011 but relapsed in January, 2012 and received another course of IV Ancef. He still had evidence of persistent tibial osteomyelitis. He was readmitted to the hospital in May 2012 and underwent a thorough debridement. Operative Gram stain and cultures were negative but he was treated again with IV Ancef. The chronic draining sinus over his lower tibia closed and his inflammatory markers returned to normal so his antibiotics were stopped in June of 2012.  The chronic ulcer/sinus we opened in 2013. He's been treated with a variety of oral antibiotics since that time. A bone scan one year ago showed intense uptake in the left tibia  compatible with osteomyelitis. He stopped his antibiotics a little over one month ago in anticipation of surgery. He was admitted today and underwent removal of the cement tibial nail and debridement of a bony sequestrum. Operative Gram stain results are pending.   Review of Systems: Review of systems not obtained due to patient factors.  Past Medical History  Diagnosis Date  . Diabetes mellitus   . Hypertension   . CAD (coronary artery disease)   . 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 Gastroparesis   . 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 GERD (gastroesophageal reflux disease)   . Hyperlipidemia   . Peripheral neuropathy   . Sleep apnea   . PONV (postoperative nausea and vomiting)   . Shortness of breath     exertion  . Depression   . Anxiety   . Arthritis   . Pulmonary embolism     hx. of 2012  . Peripheral vascular disease     History  Substance Use Topics  . Smoking status: Never Smoker   . Smokeless tobacco: Never Used  . Alcohol Use: No    Family History  Problem Relation Age of Onset  . COPD Mother   . Heart disease Mother   . Lung cancer Mother    Allergies  Allergen Reactions  . Codeine     REACTION: GI Upset  .  Gabapentin     REACTION: rash/hives (per patient, was a reaction to an inactive ingredient in another mgf brand)   . Nabumetone     REACTION: sick on stomach and rash    OBJECTIVE: Blood pressure 149/93, pulse 95, temperature 97.3 F (36.3 C), temperature source Oral, resp. rate 18, height 6' 2.5" (1.892 m), weight 138.347 kg (305 lb), SpO2 98.00%. General: He remains in the PACU and I have not examined him today  Lab Results Lab Results  Component Value Date   WBC 6.0 01/05/2014   HGB 14.9 01/05/2014   HCT 45.5 01/05/2014   MCV 95.2 01/05/2014   PLT 190 01/05/2014    Lab Results  Component Value Date   CREATININE 1.19 01/05/2014   BUN 18 01/05/2014   NA 140 01/05/2014   K 5.1 01/05/2014   CL 102  01/05/2014   CO2 24 01/05/2014    Lab Results  Component Value Date   ALT 21 12/20/2013   AST 24 12/20/2013   ALKPHOS 60 12/20/2013   BILITOT 0.6 12/20/2013     Microbiology: No results found for this or any previous visit (from the past 240 hour(s)).  Cliffton Asters, MD [facility] [phone] pager   [phone] cell 01/07/2014, 3:37 PM

## 2014-01-07 NOTE — Brief Op Note (Signed)
01/07/2014  2:47 PM  PATIENT:  Samuel BusingJohn D [name]  63 y.o. male  PRE-OPERATIVE DIAGNOSIS:  LEFT LEG CHRONIC OSTEOMYLITIS with chronic tibial ulcer  POST-OPERATIVE DIAGNOSIS:  same  Procedure(s): 1.  Removal of deep implant from the left tibia 2.  Left tibial sequestrectomy 3.  Irrigation and debridement of left tibial wound 4.  APPLICATION OF WOUND VAC to residual tibial wound 1 cm x 2 cm x 1 cm  SURGEON:  Toni ArthursJohn Jakim Drapeau, MD  ASSISTANT: n/a  ANESTHESIA:   General  EBL:  100 cc  TOURNIQUET:   Total Tourniquet Time Documented: Thigh (Left) - 25 minutes Total: Thigh (Left) - 25 minutes  Specimens:  Deep tissue to micro for culture  COMPLICATIONS:  None apparent  DISPOSITION:  Extubated, awake and stable to recovery.  DICTATION ID:  [id number]

## 2014-01-07 NOTE — H&P (Signed)
Samuel [name] Samuel [name] is an 63 y.o. male.   Chief Complaint:  Left leg wound HPI:  63 y/o male with PMH of diabetes and charcot deformity of left foot.  He has a h/o a chronic ulcer at the left medial leg where one of the pins went in for the thin wire frame.  He has failed to heal the ulcer despite extensive outpatient wound care.  He presents now for removal of the nail, I and Samuel of the area of tibial osteomyelitis and possible application of a wound VAC.  He has not been on any abx for over a month.  Past Medical History  Diagnosis Date  . Diabetes mellitus   . Hypertension   . CAD (coronary artery disease)   . 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 Gastroparesis   . 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 GERD (gastroesophageal reflux disease)   . Hyperlipidemia   . Peripheral neuropathy   . Sleep apnea   . PONV (postoperative nausea and vomiting)   . Shortness of breath     exertion  . Depression   . Anxiety   . Arthritis   . Pulmonary embolism     hx. of 2012  . Peripheral vascular disease     Past Surgical History  Procedure Laterality Date  . Cholecystectomy  01.2012  . Coronary stent placement  12 2010  . Left foot    . Ivc filter placement    . Charcoid joint surgery    . Left leg i&Samuel    . Left leg rod    . Rt toe amputation    . Left shoulder arthroscopy      Family History  Problem Relation Age of Onset  . COPD Mother   . Heart disease Mother   . Lung cancer Mother    Social History:  reports that he has never smoked. He has never used smokeless tobacco. He reports that he does not drink alcohol or use illicit drugs.  Allergies:  Allergies  Allergen Reactions  . Codeine     REACTION: GI Upset  . Gabapentin     REACTION: rash/hives (per patient, was a reaction to an inactive ingredient in another mgf brand)   . Nabumetone     REACTION: sick on stomach and rash    Medications Prior to Admission  Medication Sig Dispense Refill   . acetaminophen (TYLENOL) 500 MG tablet Take 500 mg by mouth every 8 (eight) hours as needed for moderate pain.       [name]. aspirin 81 MG chewable tablet Chew 81 mg by mouth at bedtime.       . Canagliflozin (INVOKANA) 100 MG TABS Take 100 mg by mouth every evening.       . diphenhydramine-acetaminophen (EQ PAIN RELIEVER PM) 25-500 MG TABS Take 2 tablets by mouth at bedtime.        . docusate sodium (COLACE) 100 MG capsule Take 100 mg by mouth every evening.       . ferrous sulfate 325 (65 FE) MG tablet Take 325 mg by mouth every evening.       . gabapentin (NEURONTIN) 300 MG capsule Take 300 mg by mouth 3 (three) times daily.      . insulin lispro (HUMALOG) 100 UNIT/ML injection Inject 4-7 Units into the skin 3 (three) times daily before meals. Sliding scale      . lansoprazole (PREVACID) 30 MG capsule Take 30 mg by  mouth 2 (two) times daily.        . metoCLOPramide (REGLAN) 10 MG tablet Take 10 mg by mouth 4 (four) times daily.      . naproxen sodium (ANAPROX) 220 MG tablet Take 220 mg by mouth 2 (two) times daily as needed (pain).       . pioglitazone (ACTOS) 30 MG tablet Take 30 mg by mouth at bedtime.       . pregabalin (LYRICA) 300 MG capsule Take 300 mg by mouth 3 (three) times daily.       . promethazine (PHENERGAN) 25 MG tablet Take 1 tablet (25 mg total) by mouth every 6 (six) hours as needed for nausea.  12 tablet  0  . ranitidine (ZANTAC) 150 MG tablet Take 150 mg by mouth daily.      . simvastatin (ZOCOR) 40 MG tablet Take 60 mg by mouth every evening. Take 1 and 1/2 tablets daily      . sitaGLIPtin (JANUVIA) 100 MG tablet Take 100 mg by mouth every evening.       . promethazine (PHENERGAN) 25 MG suppository Place 1 suppository (25 mg total) rectally every 6 (six) hours as needed for nausea or vomiting.  12 each  0    Results for orders placed during the hospital encounter of 01-30-2014 (from the past 48 hour(s))  GLUCOSE, CAPILLARY     Status: Abnormal   Collection Time    January 30, 2014  11:09 AM      Result Value Ref Range   Glucose-Capillary 132 (*) 70 - 99 mg/dL   Dg Chest 2 View  5/36/6440   CLINICAL DATA:  Preoperative chest radiograph.  EXAM: CHEST  2 VIEW  COMPARISON:  03/15/2011.  FINDINGS: Cardiopericardial silhouette within normal limits. Mediastinal contours normal. Trachea midline. No airspace disease or effusion. Mild LEFT basilar scarring is present.  IMPRESSION: No active cardiopulmonary disease.   Electronically Signed   By: Samuel [name] M.Samuel.   On: 01/05/2014 14:37    ROS  No recent f/c/n/v/wt loss  Blood pressure 149/93, pulse 95, temperature 97.3 F (36.3 C), temperature source Oral, resp. rate 18, height 6' 2.5" (1.892 m), weight 138.347 kg (305 lb), SpO2 98.00%. Physical Exam  wn wd male in nad.  A and o x 4.  Mood and affect normal.  EOMI.  Resp unlabored.  L leg with wound anteriorly.  No lymphadenopathy.  Pulses are palpable.  Decreased sens to LT at the foot.  5/5 strength in the ankle.  Assessment/Plan L tibial osteo with retained nail and chronic ulcer - to OR for removal of implant, I and Samuel of wound and tibia and possible wound VAC.  The risks and benefits of the alternative treatment options have been discussed in detail.  The patient wishes to proceed with surgery and specifically understands risks of bleeding, infection, nerve damage, blood clots, need for additional surgery, amputation and death.   Samuel [name] Jan 30, 2014, 12:55 PM

## 2014-01-07 NOTE — Anesthesia Postprocedure Evaluation (Signed)
Anesthesia Post Note  Patient: Samuel [name]  Procedure(s) Performed: Procedure(s) (LRB): LEFT LEG REMOVAL OF DEEP IMPLANT AND SEQUESTRECTOMY; APPLICATION OF WOUND VAC  (Left) 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 (Left)  Anesthesia type: general  Patient location: PACU  Post pain: Pain level controlled  Post assessment: Patient's Cardiovascular Status Stable  Last Vitals:  Filed Vitals:   01/07/14 1510  BP:   Pulse:   Temp: 36.9 C  Resp:     Post vital signs: Reviewed and stable  Level of consciousness: sedated  Complications: No apparent anesthesia complications

## 2014-01-07 NOTE — Consult Note (Addendum)
[facility] Medical Consultation  Samuel [name] YTK:[id number] DOB: 05/15/1951 DOA: 01/07/2014 PCP: [name], MD   Requesting physician: Victorino Dike Date of consultation: 01/07/14 Reason for consultation: medical management  Impression/Recommendations Principal Problem:   Chronic osteomyelitis involving lower leg Active Problems:   OBSTRUCTIVE SLEEP APNEA   Ulcer of lower limb, unspecified   Gastroparesis   DM (diabetes mellitus), type 2    Diabetic Gastroparesis  -Patient with history of diabetic gastroparesis, having a Gastric Emptying Scan on 07/03/2010 that showed delayed solid phase gastric emptying suggesting gastroparesis  -Reglan 10 mg  -patient states he has long standing gastroparesis- changes diet on own based on how he is feeling  Type II Diabetes Mellitus  Continue home meds- will need to be held if NPO -sliding scale    Dyslipidemia.  -On statin therapy   HTN  -home meds  Left tibial wound -per primary  I will followup again tomorrow. Please contact me if I can be of assistance in the meanwhile. Thank you for this consultation.  Chief Complaint: medical management  HPI:  63 y/o male with PMH of diabetes, gastroparesis and charcot deformity of left foot. He has a h/o a chronic ulcer at the left medial leg. He has failed to heal the ulcer despite extensive outpatient wound care. Came into hospital today for removal of the nail, I and D of the area of tibial osteomyelitis and application of a wound VAC by Dr. Victorino Dike.   Was in the hospital earlier this month for a gastroparesis exacerbation- he has done well at home.  Currently only c/o sore throat.  No chest pain, no SOB.  Last HgbA1C was 7/6: 7.7. Has gastroparesis but manages at home by changing diet based on his symptoms   Review of Systems:  All systems reviewed, negative unless stated above   Past Medical History  Diagnosis Date  . Diabetes mellitus   . Hypertension   . CAD (coronary artery  disease)   . 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 Gastroparesis   . 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 GERD (gastroesophageal reflux disease)   . Hyperlipidemia   . Peripheral neuropathy   . Sleep apnea   . PONV (postoperative nausea and vomiting)   . Shortness of breath     exertion  . Depression   . Anxiety   . Arthritis   . Pulmonary embolism     hx. of 2012  . Peripheral vascular disease    Past Surgical History  Procedure Laterality Date  . Cholecystectomy  01.2012  . Coronary stent placement  12 2010  . Left foot    . Ivc filter placement    . Charcoid joint surgery    . Left leg i&d    . Left leg rod    . Rt toe amputation    . Left shoulder arthroscopy     Social History:  reports that he has never smoked. He has never used smokeless tobacco. He reports that he does not drink alcohol or use illicit drugs.  Allergies  Allergen Reactions  . Codeine     REACTION: GI Upset  . Gabapentin     REACTION: rash/hives (per patient, was a reaction to an inactive ingredient in another mgf brand)   . Nabumetone     REACTION: sick on stomach and rash   Family History  Problem Relation Age of Onset  . COPD Mother   . Heart disease Mother   .  Lung cancer Mother     Prior to Admission medications   Medication Sig Start Date End Date Taking? Authorizing Provider  acetaminophen (TYLENOL) 500 MG tablet Take 500 mg by mouth every 8 (eight) hours as needed for moderate pain.    Yes Historical Provider, MD  aspirin 81 MG chewable tablet Chew 81 mg by mouth at bedtime.    Yes Historical Provider, MD  Canagliflozin (INVOKANA) 100 MG TABS Take 100 mg by mouth every evening.    Yes Historical Provider, MD  diphenhydramine-acetaminophen (EQ PAIN RELIEVER PM) 25-500 MG TABS Take 2 tablets by mouth at bedtime.     Yes Historical Provider, MD  docusate sodium (COLACE) 100 MG capsule Take 100 mg by mouth every evening.    Yes Historical Provider, MD   ferrous sulfate 325 (65 FE) MG tablet Take 325 mg by mouth every evening.    Yes Historical Provider, MD  gabapentin (NEURONTIN) 300 MG capsule Take 300 mg by mouth 3 (three) times daily.   Yes Historical Provider, MD  insulin lispro (HUMALOG) 100 UNIT/ML injection Inject 4-7 Units into the skin 3 (three) times daily before meals. Sliding scale   Yes Historical Provider, MD  lansoprazole (PREVACID) 30 MG capsule Take 30 mg by mouth 2 (two) times daily.     Yes Historical Provider, MD  metoCLOPramide (REGLAN) 10 MG tablet Take 10 mg by mouth 4 (four) times daily.   Yes Historical Provider, MD  naproxen sodium (ANAPROX) 220 MG tablet Take 220 mg by mouth 2 (two) times daily as needed (pain).    Yes Historical Provider, MD  pioglitazone (ACTOS) 30 MG tablet Take 30 mg by mouth at bedtime.    Yes Historical Provider, MD  pregabalin (LYRICA) 300 MG capsule Take 300 mg by mouth 3 (three) times daily.    Yes Historical Provider, MD  promethazine (PHENERGAN) 25 MG tablet Take 1 tablet (25 mg total) by mouth every 6 (six) hours as needed for nausea. 02/24/13  Yes Jennifer L Piepenbrink, PA-C  ranitidine (ZANTAC) 150 MG tablet Take 150 mg by mouth daily.   Yes Historical Provider, MD  simvastatin (ZOCOR) 40 MG tablet Take 60 mg by mouth every evening. Take 1 and 1/2 tablets daily   Yes Historical Provider, MD  sitaGLIPtin (JANUVIA) 100 MG tablet Take 100 mg by mouth every evening.    Yes Historical Provider, MD  promethazine (PHENERGAN) 25 MG suppository Place 1 suppository (25 mg total) rectally every 6 (six) hours as needed for nausea or vomiting. 04/30/13   Olivia Mackielga M Otter, MD   Physical Exam: Blood pressure 140/78, pulse 87, temperature 98.4 F (36.9 C), temperature source Oral, resp. rate 17, height 6' 2.5" (1.892 m), weight 138.347 kg (305 lb), SpO2 99.00%. Filed Vitals:   01/07/14 1555  BP: 140/78  Pulse: 87  Temp:   Resp: 17     General:  Sleeping- will awake to voice  Eyes: wnl  ENT:  wnl  Neck: supple  Cardiovascular: rrr  Respiratory: clear anterior, no wheezing  Abdomen: +BS, soft, obese  Skin: flushed  Musculoskeletal: has wound vac on left leg   Labs on Admission:  Basic Metabolic Panel:  Recent Labs Lab 01/05/14 1335  NA 140  K 5.1  CL 102  CO2 24  GLUCOSE 155*  BUN 18  CREATININE 1.19  CALCIUM 9.5   Liver Function Tests: No results found for this basename: AST, ALT, ALKPHOS, BILITOT, PROT, ALBUMIN,  in the last 168 hours No results found  for this basename: LIPASE, AMYLASE,  in the last 168 hours No results found for this basename: AMMONIA,  in the last 168 hours CBC:  Recent Labs Lab 01/05/14 1335  WBC 6.0  HGB 14.9  HCT 45.5  MCV 95.2  PLT 190   Cardiac Enzymes: No results found for this basename: CKTOTAL, CKMB, CKMBINDEX, TROPONINI,  in the last 168 hours BNP: No components found with this basename: POCBNP,  CBG:  Recent Labs Lab 01/07/14 1109 01/07/14 1512  GLUCAP 132* 113*    Radiological Exams on Admission: No results found.   Time spent: 65 min  Benjamine Mola JESSICA [facility] Pager [phone]  If 7PM-7AM, please contact night-coverage [email or web address] Password [email or web address] 01/07/2014, 4:10 PM

## 2014-01-07 NOTE — Progress Notes (Signed)
Orthopedic Tech Progress Note Patient Details:  Samuel BusingJohn D [name] 07-Feb-1951 [id number]  Ortho Devices Type of Ortho Device: CAM walker Ortho Device/Splint Location: lle Ortho Device/Splint Interventions: Application   Nikki DomCrawford, [name] 01/07/2014, 9:36 PM

## 2014-01-07 NOTE — Transfer of Care (Signed)
Immediate Anesthesia Transfer of Care Note  Patient: Samuel [name]  Procedure(s) Performed: Procedure(s): LEFT LEG REMOVAL OF DEEP IMPLANT AND SEQUESTRECTOMY; APPLICATION OF WOUND VAC  (Left) 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 (Left)  Patient Location: PACU  Anesthesia Type:General  Level of Consciousness: awake, alert , oriented and patient cooperative  Airway & Oxygen Therapy: Patient Spontanous Breathing and Patient connected to nasal cannula oxygen  Post-op Assessment: Report given to PACU RN and Post -op Vital signs reviewed and stable  Post vital signs: Reviewed and stable  Complications: No apparent anesthesia complications

## 2014-01-07 NOTE — Progress Notes (Addendum)
Lyrica and gabapentin dosing  I spoke with the patient about his Lyrica dosing - PTA med list states 300 mg TID which is above the max dose of 600 mg/day. He states he actually takes 600 mg at least BID. He is also taking gabapentin 300 mg TID which seems to be duplicate therapy.  I called [employer] and spoke with a tech names Trula OreChristina who confirmed the patient had a prescription for Lyrica 300 mg TID but this had not been filled since May 2015.  I again spoke with the patient and questioned him about his Lyrica dosing. He states that it may be prescribe 300 mg TID but that's not the way he takes it. I counseled him that he was taking too much Lyrica and suggested he only take it as prescribed. Also told him he should talk to his provider about this.  [place]sterfieldJennifer Harrington, [address] BoulevardPharm.D., BCPS Clinical Pharmacist Pager: [phone] 01/07/2014 8:30 PM   Addendum: I spoke with Zonia KiefJames Owens, PA about this patient taking duplicate therapy and the Lyrica dosing (prescribed vs what patient is actually taking). Received verbal order to discontinue the Lyrica and continue the gabapentin. Patient is to follow-up with Dr. Victorino DikeHewitt about Lyrica dosing in the morning.  BloomingdaleJennifer , [address] BoulevardPharm[address]D., BCPS Clinical Pharmacist Pager: [phone] 01/07/2014 9:07 PM

## 2014-01-07 NOTE — Op Note (Signed)
NAMEMarland Kitchen  KOTA, CIANCIO NO.:  [id number]  MEDICAL RECORD NO.:  [id number]  LOCATION:  [id number]                        FACILITY:  [facility]  PHYSICIAN:  Toni Arthurs, MD        DATE OF BIRTH:  07/06/1950  DATE OF PROCEDURE:  01/07/2014 DATE OF DISCHARGE:                              OPERATIVE REPORT   PREOPERATIVE DIAGNOSES: 1. Left tibial chronic osteomyelitis. 2. Chronic left diabetic pretibial ulcer.  POSTOPERATIVE DIAGNOSES: 1. Left tibial chronic osteomyelitis. 2. Chronic left diabetic pretibial ulcer.  PROCEDURES: 1. Removal of deep implant from the left tibia. 2. Left tibial sequestrectomy. 3. Irrigation and debridement of left tibial wound. 4. Application of wound VAC to residual tibial wound measuring 1 cm x     2 cm x 1 cm.  SURGEON:  Toni Arthurs, MD  ANESTHESIA:  General.  ESTIMATED BLOOD LOSS:  100 mL.  TOURNIQUET TIME:  25 minutes at 250 mmHg.  SPECIMENS:  Deep tissue to Microbiology for culture.  COMPLICATIONS:  None apparent.  DISPOSITION:  Extubated, awake, and stable to recovery.  INDICATIONS FOR PROCEDURE:  The patient is a 63 year old male with past medical history significant for diabetes, complicated by left Charcot foot deformity.  He underwent midfoot arthrodesis with the use of a thin wire circular frame.  He developed a wound at one of the tibial pins and ultimately developed osteomyelitis adjacent to that pin.  He underwent multiple I and D's in the past most recently with the insertion of an antibiotic cement nail in the tibia.  He has continued having trouble with a nonhealing ulcer present chronically over the side of previous tibial osteomyelitis.  He presents now for removal of the nail, repeat irrigation and debridement with sequestrectomy and excision of the wound.  He also may need wound VAC placement.  He understands the risks and benefits, the alternative treatment options, and elects surgical treatment.  He  specifically understands risks of bleeding, infection, nerve damage, blood clots, need for additional surgery, continued pain, amputation, and death.  PROCEDURE IN DETAIL:  After preoperative consent was obtained and the correct operative site was identified, the patient was brought to the operating room and placed supine on the operating table.  General anesthesia was induced.  Preoperative antibiotics were held.  Surgical time-out was taken.  Left lower extremity was prepped and draped in standard sterile fashion and the tourniquet around the thigh.  The extremity was exsanguinated and tourniquet was inflated to 250 mmHg.  A longitudinal incision was made at the previous knee incision site. Sharp dissection was carried down through the skin and subcutaneous tissue.  The patellar tendon was split longitudinally.  The metal eye at the proximal end of the nail was identified.  It was cleaned of all fibrinous tissue surrounding it.  It was grasped with a hip cement extractor and a SLAP hammer was used to back it out.  It was removed in its entirety.  The tibial canal was then sequentially reamed with a flexible reamer to a diameter of 12 mm, removing all of the fibrous tissue from the medullary canal.  The patient's tibial ulcer was identified.  It was excised  with an ellipsoid incision.  The tibial pin hole was identified.  It was curetted out and the tissue from this hole was sent as a specimen to Microbiology for aerobic and anaerobic culture, as well as AFB and fungal cultures.  The wound of the medullary canal was then irrigated copiously with 3 liters of normal saline. Proximally, the knee wound was irrigated copiously as well.  The patellar tendon was repaired with 2-0 PDS simple sutures.  The paratenon and subcutaneous tissues were approximated with inverted simple sutures of 2-0 PDS.  The skin was closed with a running nylon.  The tibial sequestrectomy site was then packed with  Stimulan beads with tobramycin and vancomycin.  The wounds were then closed with near-far-far-near sutures of 2-0 nylon.  The residual wound measured 1 cm wide x 2 cm long x 1 cm deep.  The wound VAC sponge was cut to fit this wound.  The wounds were then dressed with Mepitel.  A sponge was placed over the Mepitel dressing and the occlusive dressings were applied.  A 125 mm of continuous suction was applied using the [employer] wound VAC.  The patient was then awakened from anesthesia and transported to the recovery room in stable condition.  The tourniquet had been released after the nail was extracted.  1.5 g of vancomycin were administered intravenously after the cultures were obtained.  FOLLOWUP PLAN:  The patient will be admitted to the hospital for IV antibiotics, awaiting culture results, and will continued to have wound VAC dressing changes and will be weightbearing as tolerated.     Toni ArthursJohn Harbour Nordmeyer, MD     JH/MEDQ  D:  01/07/2014  T:  01/07/2014  Job:  [id number]

## 2014-01-07 NOTE — Anesthesia Preprocedure Evaluation (Addendum)
Anesthesia Evaluation  Patient identified by MRN, date of birth, ID band Patient awake    Reviewed: Allergy & Precautions, H&P , NPO status , Patient's Chart, lab work & pertinent test results  History of Anesthesia Complications (+) PONV  Airway Mallampati: II  Neck ROM: full    Dental   Pulmonary shortness of breath, sleep apnea ,          Cardiovascular hypertension, + CAD, + Cardiac Stents and + Peripheral Vascular Disease     Neuro/Psych Anxiety Depression  Neuromuscular disease    GI/Hepatic GERD-  ,  Endo/Other  diabetes, Type obesity  Renal/GU      Musculoskeletal   Abdominal   Peds  Hematology   Anesthesia Other Findings   Reproductive/Obstetrics                          Anesthesia Physical Anesthesia Plan  ASA: III  Anesthesia Plan: General   Post-op Pain Management:    Induction: Intravenous  Airway Management Planned: LMA  Additional Equipment:   Intra-op Plan:   Post-operative Plan:   Informed Consent: I have reviewed the patients History and Physical, chart, labs and discussed the procedure including the risks, benefits and alternatives for the proposed anesthesia with the patient or authorized representative who has indicated his/her understanding and acceptance.     Plan Discussed with: CRNA, Anesthesiologist and Surgeon  Anesthesia Plan Comments:         Anesthesia Quick Evaluation

## 2014-01-07 NOTE — Consult Note (Signed)
PHARMACY CONSULT NOTE - INITIAL  Pharmacy Consult for :   Vancomycin Indication:  Chronic osteomyelitis of L-tibia  Hospital Problems: Principal Problem:   Chronic osteomyelitis involving lower leg Active Problems:   OBSTRUCTIVE SLEEP APNEA   Ulcer of lower limb, unspecified   Gastroparesis   DM (diabetes mellitus), type 2   Allergies: Allergies  Allergen Reactions  . Codeine     REACTION: GI Upset  . Gabapentin     REACTION: rash/hives (per patient, was a reaction to an inactive ingredient in another mgf brand)   . Nabumetone     REACTION: sick on stomach and rash    Patient Measurements: Height: 6' 2.5" (189.2 cm) Weight: 305 lb (138.347 kg) IBW/kg (Calculated) : 83.35  Vital Signs: BP 140/78  Pulse 87  Temp(Src) 98.4 F (36.9 C) (Oral)  Resp 17  Ht 6' 2.5" (1.892 m)  Wt 305 lb (138.347 kg)  BMI 38.65 kg/m2  SpO2 99%  Labs:  Recent Labs  01/05/14 1335  WBC 6.0  HGB 14.9  PLT 190  CREATININE 1.19   Estimated Creatinine Clearance: 98.4 ml/min (by C-G formula based on Cr of 1.19).   Microbiology: No results found for this or any previous visit (from the past 720 hour(s)).  Medical/Surgical History: Past Medical History  Diagnosis Date  . Diabetes mellitus   . Hypertension   . CAD (coronary artery disease)   . 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 Gastroparesis   . 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 GERD (gastroesophageal reflux disease)   . Hyperlipidemia   . Peripheral neuropathy   . Sleep apnea   . PONV (postoperative nausea and vomiting)   . Shortness of breath     exertion  . Depression   . Anxiety   . Arthritis   . Pulmonary embolism     hx. of 2012  . Peripheral vascular disease    Past Surgical History  Procedure Laterality Date  . Cholecystectomy  01.2012  . Coronary stent placement  12 2010  . Left foot    . Ivc filter placement    . Charcoid joint surgery    . Left leg i&d    . Left  leg rod    . Rt toe amputation    . Left shoulder arthroscopy      Current Medication[s] Include: Medication PTA: Prescriptions prior to admission  Medication Sig Dispense Refill  . acetaminophen (TYLENOL) 500 MG tablet Take 500 mg by mouth every 8 (eight) hours as needed for moderate pain.       [name] aspirin 81 MG chewable tablet Chew 81 mg by mouth at bedtime.       . Canagliflozin (INVOKANA) 100 MG TABS Take 100 mg by mouth every evening.       . diphenhydramine-acetaminophen (EQ PAIN RELIEVER PM) 25-500 MG TABS Take 2 tablets by mouth at bedtime.        . docusate sodium (COLACE) 100 MG capsule Take 100 mg by mouth every evening.       . ferrous sulfate 325 (65 FE) MG tablet Take 325 mg by mouth every evening.       . gabapentin (NEURONTIN) 300 MG capsule Take 300 mg by mouth 3 (three) times daily.      . insulin lispro (HUMALOG) 100 UNIT/ML injection Inject 4-7 Units into the skin 3 (three) times daily before meals. Sliding scale      . lansoprazole (PREVACID) 30  MG capsule Take 30 mg by mouth 2 (two) times daily.        . metoCLOPramide (REGLAN) 10 MG tablet Take 10 mg by mouth 4 (four) times daily.      . naproxen sodium (ANAPROX) 220 MG tablet Take 220 mg by mouth 2 (two) times daily as needed (pain).       . pioglitazone (ACTOS) 30 MG tablet Take 30 mg by mouth at bedtime.       . pregabalin (LYRICA) 300 MG capsule Take 300 mg by mouth 3 (three) times daily.       . promethazine (PHENERGAN) 25 MG tablet Take 1 tablet (25 mg total) by mouth every 6 (six) hours as needed for nausea.  12 tablet  0  . ranitidine (ZANTAC) 150 MG tablet Take 150 mg by mouth daily.      . simvastatin (ZOCOR) 40 MG tablet Take 60 mg by mouth every evening. Take 1 and 1/2 tablets daily      . sitaGLIPtin (JANUVIA) 100 MG tablet Take 100 mg by mouth every evening.       . promethazine (PHENERGAN) 25 MG suppository Place 1 suppository (25 mg total) rectally every 6 (six) hours as needed for nausea or vomiting.   12 each  0     Scheduled:  Scheduled:  . chlorhexidine  60 mL Topical Once  . HYDROmorphone      . insulin aspart  0-15 Units Subcutaneous TID WC  . insulin aspart  0-5 Units Subcutaneous QHS  . ondansetron      . vancomycin  1,500 mg Intravenous To OR    Infusion[s]: Infusions:  . sodium chloride    . sodium chloride    . lactated ringers 50 mL/hr at 01/07/14 1151    Antibiotic[s]: Anti-infectives   Start     Dose/Rate Route Frequency Ordered Stop   01/07/14 1337  vancomycin (VANCOCIN) powder  Status:  Discontinued       As needed 01/07/14 1433 01/07/14 1516   01/07/14 1337  gentamicin (GARAMYCIN) injection  Status:  Discontinued       As needed 01/07/14 1435 01/07/14 1516   01/07/14 1330  vancomycin (VANCOCIN) 1,500 mg in sodium chloride 0.9 % 500 mL IVPB     1,500 mg 250 mL/hr over 120 Minutes Intravenous To Surgery 01/07/14 1315 01/08/14 1330      Assessment:  63 y/o male s/p L-leg nail removal, I & D, wound vac who has a history of chronic osteomyelitis.  He has had multiple procedures and multiple antibiotic regimens.  ID to begin Vancomycin to cover for MSSA and MRSA per Pharmacy consult.  Gram stain and cultures pending.  Patient received Vancomycin 1500 mg pre-op.  Goal of Therapy:   Vancomycin trough level 15-20 mcg/ml  Plan:  1. Begin Vancomycin 1250 mg IV q 12 hours. 2. Follow up SCr, UOP, cultures, steady state Levels, clinical course and adjust as clinically indicated.  Ila Landowski, Elisha HeadlandEarle J,  Pharm.D,    7/23/20155:43 PM

## 2014-01-08 ENCOUNTER — Encounter (HOSPITAL_COMMUNITY): Payer: Self-pay | Admitting: Orthopedic Surgery

## 2014-01-08 DIAGNOSIS — K219 Gastro-esophageal reflux disease without esophagitis: Secondary | ICD-10-CM

## 2014-01-08 LAB — GLUCOSE, CAPILLARY
GLUCOSE-CAPILLARY: 108 mg/dL — AB (ref 70–99)
GLUCOSE-CAPILLARY: 118 mg/dL — AB (ref 70–99)
Glucose-Capillary: 122 mg/dL — ABNORMAL HIGH (ref 70–99)
Glucose-Capillary: 131 mg/dL — ABNORMAL HIGH (ref 70–99)

## 2014-01-08 MED ORDER — SODIUM CHLORIDE 0.9 % IJ SOLN
10.0000 mL | INTRAMUSCULAR | Status: DC | PRN
  Administered 2014-01-09 – 2014-01-11 (×3): 10 mL

## 2014-01-08 MED ORDER — PROMETHAZINE HCL 25 MG/ML IJ SOLN
25.0000 mg | Freq: Four times a day (QID) | INTRAMUSCULAR | Status: DC | PRN
  Administered 2014-01-09 (×2): 25 mg via INTRAVENOUS
  Filled 2014-01-08: qty 1

## 2014-01-08 MED ORDER — PROMETHAZINE HCL 25 MG/ML IJ SOLN
12.5000 mg | Freq: Four times a day (QID) | INTRAMUSCULAR | Status: DC | PRN
  Filled 2014-01-08 (×2): qty 1

## 2014-01-08 NOTE — Progress Notes (Signed)
[facility]  Patient Status:  New pt this admission for [facility]/previous pt for DME  [facility] is providing the following services: [facility] for [facility]  If patient discharges after hours, please call [phone].   Sedalia Mutaamela S Chandler 01/08/2014, 5:49 PM

## 2014-01-08 NOTE — Progress Notes (Signed)
[facility] PROGRESS NOTE  Samuel [name] ZOX:[id number] DOB: January 31, 1951 DOA: 01/07/2014 PCP: [name], MD  Assessment/Plan: Diabetic Gastroparesis  -Patient with history of diabetic gastroparesis, having a Gastric Emptying Scan on 07/03/2010 that showed delayed solid phase gastric emptying suggesting gastroparesis  -Continue Reglan 10 mg  -Change Zofran to Phenergan per patient's request, follow Type II Diabetes Mellitus  Continue home meds -sliding scale  Dyslipidemia.  -On statin therapy  HTN  -home meds  Left tibial ulcer/chronic  osteomyelitis -per primary team, status post surgery   Code Status: full Family Communication: None at bedside Disposition Plan: Per primary team    Procedures: PROCEDURES:  1. Removal of deep implant from the left tibia.  2. Left tibial sequestrectomy.  3. Irrigation and debridement of left tibial wound.  4. Application of wound VAC to residual tibial wound measuring 1 cm x  2 cm x 1 cm-per orthopedics    HPI/Subjective:  reports dry heaves early in the a.m.,+nausea, tolerating by mouth  Objective: Filed Vitals:   01/08/14 1342  BP: 122/66  Pulse: 102  Temp: 98.4 F (36.9 C)  Resp: 18    Intake/Output Summary (Last 24 hours) at 01/08/14 1824 Last data filed at 01/08/14 1740  Gross per 24 hour  Intake   1760 ml  Output   1750 ml  Net     10 ml   Filed Weights   01/07/14 1106 01/07/14 1920  Weight: 138.347 kg (305 lb) 148.145 kg (326 lb 9.6 oz)    Exam:  General: alert & oriented x 3 In NAD Cardiovascular: RRR, nl S1 s2 Respiratory: CTAB Abdomen: soft +BS NT/ND, no masses palpable Extremities: No cyanosis and no edema    Data Reviewed: Basic Metabolic Panel:  Recent Labs Lab 01/05/14 1335  NA 140  K 5.1  CL 102  CO2 24  GLUCOSE 155*  BUN 18  CREATININE 1.19  CALCIUM 9.5   Liver Function Tests: No results found for this basename: AST, ALT, ALKPHOS, BILITOT, PROT, ALBUMIN,  in the last 168  hours No results found for this basename: LIPASE, AMYLASE,  in the last 168 hours No results found for this basename: AMMONIA,  in the last 168 hours CBC:  Recent Labs Lab 01/05/14 1335  WBC 6.0  HGB 14.9  HCT 45.5  MCV 95.2  PLT 190   Cardiac Enzymes: No results found for this basename: CKTOTAL, CKMB, CKMBINDEX, TROPONINI,  in the last 168 hours BNP (last 3 results) No results found for this basename: PROBNP,  in the last 8760 hours CBG:  Recent Labs Lab 01/07/14 1512 01/07/14 2150 01/08/14 0810 01/08/14 1200 01/08/14 1702  GLUCAP 113* 94 108* 118* 122*    Recent Results (from the past 240 hour(s))  TISSUE CULTURE     Status: None   Collection Time    01/07/14  1:12 PM      Result Value Ref Range Status   Specimen Description TISSUE LEG LEFT   Final   Special Requests DEEP LEFT TIBIA   Final   Gram Stain     Final   Value: RARE WBC PRESENT, PREDOMINANTLY PMN     NO ORGANISMS SEEN     Performed at [facility]   Culture     Final   Value: NO GROWTH 1 DAY     Performed at [facility]   Report Status PENDING   Incomplete  ANAEROBIC CULTURE     Status: None   Collection Time  01/07/14  1:12 PM      Result Value Ref Range Status   Specimen Description TISSUE LEG LEFT   Final   Special Requests DEEP LEFT TIBIA   Final   Gram Stain     Final   Value: RARE WBC PRESENT, PREDOMINANTLY PMN     NO ORGANISMS SEEN     [facility]   Culture     Final   Value: NO ANAEROBES ISOLATED; CULTURE IN PROGRESS FOR 5 DAYS     [facility]   Report Status PENDING   Incomplete  AFB CULTURE WITH SMEAR     Status: None   Collection Time    01/07/14  1:12 PM      Result Value Ref Range Status   Specimen Description TISSUE LEG LEFT   Final   Special Requests DEEP LEFT TIBIA   Final   Acid Fast Smear     Final   Value: NO ACID FAST BACILLI SEEN     [facility]   Culture     Final   Value: CULTURE  WILL BE EXAMINED FOR 6 WEEKS BEFORE ISSUING A FINAL REPORT     [facility]   Report Status PENDING   Incomplete  FUNGUS CULTURE W SMEAR     Status: None   Collection Time    01/07/14  1:12 PM      Result Value Ref Range Status   Specimen Description TISSUE LEG LEFT   Final   Special Requests DEEP LEFT TIBIA   Final   Fungal Smear     Final   Value: NO YEAST OR FUNGAL ELEMENTS SEEN     [facility]   Culture     Final   Value: CULTURE IN PROGRESS FOR FOUR WEEKS     [facility]   Report Status PENDING   Incomplete     Studies: No results found.  Scheduled Meds: . aspirin  81 mg Oral QHS  . Canagliflozin  100 mg Oral QPM  . docusate sodium  100 mg Oral QPM  . famotidine  20 mg Oral Daily  . ferrous sulfate  325 mg Oral QPM  . gabapentin  300 mg Oral TID  . insulin aspart  0-15 Units Subcutaneous TID WC  . insulin aspart  0-5 Units Subcutaneous QHS  . linagliptin  5 mg Oral Daily  . metoCLOPramide  10 mg Oral TID AC & HS  . pantoprazole  40 mg Oral Daily  . pioglitazone  30 mg Oral QHS  . piperacillin-tazobactam (ZOSYN)  IV  3.375 g Intravenous 3 times per day  . senna  2 tablet Oral BID  . simvastatin  60 mg Oral QPM  . vancomycin (VANCOCIN) 1250 mg IVPB  1,250 mg Intravenous Q12H   Continuous Infusions: . sodium chloride Stopped (01/08/14 1427)    Principal Problem:   Chronic osteomyelitis involving lower leg Active Problems:   OBSTRUCTIVE SLEEP APNEA   Ulcer of lower limb, unspecified   Gastroparesis   DM (diabetes mellitus), type 2    Time spent: 35    Judah Carchi C  [facility] Pager [phone]. If 7PM-7AM, please contact night-coverage at [email or web address], password [facility] 01/08/2014, 6:24 PM  LOS: 1 day

## 2014-01-08 NOTE — Care Management Note (Signed)
  Page 1 of 1   01/11/2014     1:31:16 PM CARE MANAGEMENT NOTE 01/11/2014  Patient:  Samuel [name],Samuel [name]   Account Number:  [id number]  Date Initiated:  01/08/2014  Documentation initiated by:  Ronny FlurryWILE,Korah Hufstedler  Subjective/Objective Assessment:     Action/Plan:   Anticipated DC Date:  01/11/2014   Anticipated DC Plan:  HOME W HOME HEALTH SERVICES         Choice offered to / List presented to:             Status of service:   Medicare Important Message given?  YES (If response is "NO", the following Medicare IM given date fields will be blank) Date Medicare IM given:  01/08/2014 Medicare IM given by:  Ronny FlurryWILE,Jette Lewan Date Additional Medicare IM given:  01/11/2014 Additional Medicare IM given by:  Ronny FlurryHEATHER Roselene Gray  Discharge Disposition:    Per UR Regulation:    If discussed at Long Length of Stay Meetings, dates discussed:    Comments:  01-08-14 [facility]  Patient confirmed face sheet information . Has been at home before on IV antibiotics .  [facility] , [facility] made will need Home Health changes to change VAC and IV antibiotics . Will need prescription for IV antibiotics at discharge also . [facility] through through [facility]. Ronny FlurryHeather Georgie Eduardo RN BSN   01-08-14 Called Dr Hewitt's office , asking for home health order , [facility] date and [facility]. Faxed VAC application to [phone] . Awaiting response. Ronny FlurryHeather Elsia Lasota RN BSN

## 2014-01-08 NOTE — Progress Notes (Signed)
Peripherally Inserted Central Catheter/Midline Placement  The IV Nurse has discussed with the patient and/or persons authorized to consent for the patient, the purpose of this procedure and the potential benefits and risks involved with this procedure.  The benefits include less needle sticks, lab draws from the catheter and patient may be discharged home with the catheter.  Risks include, but not limited to, infection, bleeding, blood clot (thrombus formation), and puncture of an artery; nerve damage and irregular heat beat.  Alternatives to this procedure were also discussed.  PICC/Midline Placement Documentation        Samuel [name], Amy Belloso Jean 01/08/2014, 2:17 PM

## 2014-01-08 NOTE — Progress Notes (Signed)
Subjective: 1 Day Post-Op Procedure(s) (LRB): LEFT LEG REMOVAL OF DEEP IMPLANT AND SEQUESTRECTOMY; APPLICATION OF WOUND VAC  (Left) 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 (Left) Patient reports pain as moderate.  Pt c/o nausea c/w his h/o diabetic gastroparesis.  Objective: Vital signs in last 24 hours: Temp:  [98 F (36.7 C)-99.2 F (37.3 C)] 98.9 F (37.2 C) (07/24 1027) Pulse Rate:  [87-105] 105 (07/24 1027) Resp:  [12-23] 20 (07/24 1027) BP: (109-152)/(58-93) 116/59 mmHg (07/24 1027) SpO2:  [92 %-100 %] 95 % (07/24 1027) Weight:  [148.145 kg (326 lb 9.6 oz)] 148.145 kg (326 lb 9.6 oz) (07/23 1920)  Intake/Output from previous day: 07/23 0701 - 07/24 0700 In: 2700 [P.O.:250; I.V.:2150; IV Piggyback:300] Out: 1090 [Urine:800; Drains:250; Blood:40] Intake/Output this shift: Total I/O In: 120 [P.O.:120] Out: 150 [Urine:150]   Recent Labs  01/05/14 1335  HGB 14.9    Recent Labs  01/05/14 1335  WBC 6.0  RBC 4.78  HCT 45.5  PLT 190    Recent Labs  01/05/14 1335  NA 140  K 5.1  CL 102  CO2 24  BUN 18  CREATININE 1.19  GLUCOSE 155*  CALCIUM 9.5   No results found for this basename: LABPT, INR,  in the last 72 hours  PE:  Abdomen slightly tympanitic and distended.  NTTP.  L LE wounds dressed and dry.  SS drainage in [place] cannister.   Cxs:  No growth to date.  Assessment/Plan: 1 Day Post-Op Procedure(s) (LRB): LEFT LEG REMOVAL OF DEEP IMPLANT AND SEQUESTRECTOMY; APPLICATION OF WOUND VAC  (Left) 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 (Left) Continue vanc and zosyn per Dr. Orvan Falconerampbell.  Appreciate Dr. Burna SisVAnn's assistance with this complex patient.  WBAT in cam boot.  Hopefully we can discharge the patient Monday after culture results are final.  [facility].  Toni ArthursHEWITT, Arlee 01/08/2014, 12:48 PM

## 2014-01-08 NOTE — Progress Notes (Signed)
Patient ID: Samuel BusingJohn D [name], male   DOB: [birth year], 63 y.o.   MRN: [id number]         [facility]    Date of Admission:  01/07/2014           Day 2 vancomycin        Day 2 piperacillin tazobactam  Principal Problem:   Chronic osteomyelitis involving lower leg Active Problems:   OBSTRUCTIVE SLEEP APNEA   Ulcer of lower limb, unspecified   Gastroparesis   DM (diabetes mellitus), type 2   . aspirin  81 mg Oral QHS  . Canagliflozin  100 mg Oral QPM  . docusate sodium  100 mg Oral QPM  . famotidine  20 mg Oral Daily  . ferrous sulfate  325 mg Oral QPM  . gabapentin  300 mg Oral TID  . insulin aspart  0-15 Units Subcutaneous TID WC  . insulin aspart  0-5 Units Subcutaneous QHS  . linagliptin  5 mg Oral Daily  . metoCLOPramide  10 mg Oral TID AC & HS  . pantoprazole  40 mg Oral Daily  . pioglitazone  30 mg Oral QHS  . piperacillin-tazobactam (ZOSYN)  IV  3.375 g Intravenous 3 times per day  . senna  2 tablet Oral BID  . simvastatin  60 mg Oral QPM  . vancomycin (VANCOCIN) 1250 mg IVPB  1,250 mg Intravenous Q12H    Subjective: He states that he got tired of dealing with the chronic, nonhealing ulcer on his left shin and the side he had to get off the fence to do something about it. He has been off doxycycline for over a month. He has not had any fever, chills or sweats. Review of Systems: Pertinent items are noted in HPI.  Past Medical History  Diagnosis Date  . Hypertension   . CAD (coronary artery disease)   . Gastroparesis   . GERD (gastroesophageal reflux disease)   . Hyperlipidemia   . Peripheral neuropathy   . PONV (postoperative nausea and vomiting)   . Shortness of breath     exertion  . Depression   . Anxiety   . Pulmonary embolism     hx. of 2012  . Peripheral vascular disease   . OSA (obstructive sleep apnea)     "not bad enough for a mask"  . Type II diabetes mellitus   . 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  H/O hiatal hernia   . Arthritis     "all over"   . Charcot's joint     "left foot"    History  Substance Use Topics  . Smoking status: Never Smoker   . Smokeless tobacco: Never Used  . Alcohol Use: Yes     Comment: 01/07/2014 'might have a wine cooler a couple times/yr"    Family History  Problem Relation Age of Onset  . COPD Mother   . Heart disease Mother   . Lung cancer Mother     Allergies  Allergen Reactions  . Codeine     REACTION: GI Upset  . Gabapentin     REACTION: rash/hives (per patient, was a reaction to an inactive ingredient in another mgf brand)   . Nabumetone     REACTION: sick on stomach and rash    Objective: Temp:  [98 F (36.7 C)-99.2 F (37.3 C)] 98.9 F (37.2 C) (07/24 1027) Pulse Rate:  [87-105] 105 (07/24 1027) Resp:  [12-23] 20 (07/24 1027) BP: (  109-152)/(58-93) 116/59 mmHg (07/24 1027) SpO2:  [92 %-100 %] 95 % (07/24 1027) Weight:  [148.145 kg (326 lb 9.6 oz)] 148.145 kg (326 lb 9.6 oz) (07/23 1920)  General: He is alert and in good spirits His left leg is wrapped in an Ace bandage  Lab Results Lab Results  Component Value Date   WBC 6.0 01/05/2014   HGB 14.9 01/05/2014   HCT 45.5 01/05/2014   MCV 95.2 01/05/2014   PLT 190 01/05/2014    Lab Results  Component Value Date   CREATININE 1.19 01/05/2014   BUN 18 01/05/2014   NA 140 01/05/2014   K 5.1 01/05/2014   CL 102 01/05/2014   CO2 24 01/05/2014    Lab Results  Component Value Date   ALT 21 12/20/2013   AST 24 12/20/2013   ALKPHOS 60 12/20/2013   BILITOT 0.6 12/20/2013      Microbiology: Recent Results (from the past 240 hour(s))  TISSUE CULTURE     Status: None   Collection Time    01/07/14  1:12 PM      Result Value Ref Range Status   Specimen Description TISSUE LEG LEFT   Final   Special Requests DEEP LEFT TIBIA   Final   Gram Stain     Final   Value: RARE WBC PRESENT, PREDOMINANTLY PMN     NO ORGANISMS SEEN     Performed at [facility]   Culture     Final   Value:  NO GROWTH 1 DAY     Performed at [facility]   Report Status PENDING   Incomplete  ANAEROBIC CULTURE     Status: None   Collection Time    01/07/14  1:12 PM      Result Value Ref Range Status   Specimen Description TISSUE LEG LEFT   Final   Special Requests DEEP LEFT TIBIA   Final   Gram Stain     Final   Value: RARE WBC PRESENT, PREDOMINANTLY PMN     NO ORGANISMS SEEN     Performed at [facility]   Culture     Final   Value: NO ANAEROBES ISOLATED; CULTURE IN PROGRESS FOR 5 DAYS     Performed at [facility]   Report Status PENDING   Incomplete  FUNGUS CULTURE W SMEAR     Status: None   Collection Time    01/07/14  1:12 PM      Result Value Ref Range Status   Specimen Description TISSUE LEG LEFT   Final   Special Requests DEEP LEFT TIBIA   Final   Fungal Smear     Final   Value: NO YEAST OR FUNGAL ELEMENTS SEEN     Performed at [facility]   Culture     Final   Value: CULTURE IN PROGRESS FOR FOUR WEEKS     Performed at [facility]   Report Status PENDING   Incomplete    Assessment: He has chronic left tibial osteomyelitis. His original operative cultures 5 years ago grew MSSA. Yesterday's operative Gram stain shows no organisms. Continue current antibiotic therapy pending final cultures. I will have a PICC placed in anticipation of another long course of IV antibiotics to improve the chance of curing his infection. He agrees with that plan.  Plan: 1. Continue current antibiotics 2. PICC placement 3. Await final cultures  Cliffton Asters, MD [facility] [phone]  pager   [phone] cell 01/08/2014, 12:36 PM

## 2014-01-09 LAB — BASIC METABOLIC PANEL
Anion gap: 15 (ref 5–15)
BUN: 16 mg/dL (ref 6–23)
CO2: 25 mEq/L (ref 19–32)
CREATININE: 1.29 mg/dL (ref 0.50–1.35)
Calcium: 8.6 mg/dL (ref 8.4–10.5)
Chloride: 98 mEq/L (ref 96–112)
GFR calc Af Amer: 68 mL/min — ABNORMAL LOW (ref >90)
GFR, EST NON AFRICAN AMERICAN: 59 mL/min — AB (ref >90)
Glucose, Bld: 124 mg/dL — ABNORMAL HIGH (ref 70–99)
Potassium: 4.1 mEq/L (ref 3.7–5.3)
Sodium: 138 mEq/L (ref 137–147)

## 2014-01-09 LAB — GLUCOSE, CAPILLARY
GLUCOSE-CAPILLARY: 129 mg/dL — AB (ref 70–99)
GLUCOSE-CAPILLARY: 134 mg/dL — AB (ref 70–99)
Glucose-Capillary: 133 mg/dL — ABNORMAL HIGH (ref 70–99)
Glucose-Capillary: 109 mg/dL — ABNORMAL HIGH (ref 70–99)

## 2014-01-09 NOTE — Progress Notes (Addendum)
INFECTIOUS DISEASE PROGRESS NOTE  ID: Samuel [name] is a 63 y.o. male with  Principal Problem:   Chronic osteomyelitis involving lower leg Active Problems:   OBSTRUCTIVE SLEEP APNEA   Ulcer of lower limb, unspecified   Gastroparesis   DM (diabetes mellitus), type 2  Subjective: Without complaints.  States he has previously been on vanco and cipro.   Abtx:  Anti-infectives   Start     Dose/Rate Route Frequency Ordered Stop   01/07/14 2200  piperacillin-tazobactam (ZOSYN) IVPB 3.375 g     3.375 g 12.5 mL/hr over 240 Minutes Intravenous 3 times per day 01/07/14 2023     01/07/14 2100  vancomycin (VANCOCIN) 1,250 mg in sodium chloride 0.9 % 250 mL IVPB     1,250 mg 166.7 mL/hr over 90 Minutes Intravenous Every 12 hours 01/07/14 1750     01/07/14 1337  vancomycin (VANCOCIN) powder  Status:  Discontinued       As needed 01/07/14 1433 01/07/14 1516   01/07/14 1337  gentamicin (GARAMYCIN) injection  Status:  Discontinued       As needed 01/07/14 1435 01/07/14 1516   01/07/14 1330  vancomycin (VANCOCIN) 1,500 mg in sodium chloride 0.9 % 500 mL IVPB  Status:  Discontinued     1,500 mg 250 mL/hr over 120 Minutes Intravenous To Surgery 01/07/14 1315 01/07/14 1855      Medications:  Scheduled: . aspirin  81 mg Oral QHS  . Canagliflozin  100 mg Oral QPM  . docusate sodium  100 mg Oral QPM  . famotidine  20 mg Oral Daily  . ferrous sulfate  325 mg Oral QPM  . gabapentin  300 mg Oral TID  . insulin aspart  0-15 Units Subcutaneous TID WC  . insulin aspart  0-5 Units Subcutaneous QHS  . linagliptin  5 mg Oral Daily  . metoCLOPramide  10 mg Oral TID AC & HS  . pantoprazole  40 mg Oral Daily  . pioglitazone  30 mg Oral QHS  . piperacillin-tazobactam (ZOSYN)  IV  3.375 g Intravenous 3 times per day  . senna  2 tablet Oral BID  . simvastatin  60 mg Oral QPM  . vancomycin (VANCOCIN) 1250 mg IVPB  1,250 mg Intravenous Q12H    Objective: Vital signs in last 24 hours: Temp:  [97.6  F (36.4 C)-98.4 F (36.9 C)] 98.4 F (36.9 C) (07/25 1332) Pulse Rate:  [90-92] 92 (07/25 1332) Resp:  [16-19] 16 (07/25 1332) BP: (132-139)/(63-68) 132/68 mmHg (07/25 1332) SpO2:  [91 %-96 %] 96 % (07/25 1332)   General appearance: alert, cooperative and no distress Extremities: PIC LUE. LLE dressed, drain in place  Lab Results  Recent Labs  01/09/14 0500  NA 138  K 4.1  CL 98  CO2 25  BUN 16  CREATININE 1.29   Liver Panel No results found for this basename: PROT, ALBUMIN, AST, ALT, ALKPHOS, BILITOT, BILIDIR, IBILI,  in the last 72 hours Sedimentation Rate No results found for this basename: ESRSEDRATE,  in the last 72 hours C-Reactive Protein No results found for this basename: CRP,  in the last 72 hours  Microbiology: Recent Results (from the past 240 hour(s))  TISSUE CULTURE     Status: None   Collection Time    01/07/14  1:12 PM      Result Value Ref Range Status   Specimen Description TISSUE LEG LEFT   Final   Special Requests DEEP LEFT TIBIA   Final  Gram Stain     Final   Value: RARE WBC PRESENT, PREDOMINANTLY PMN     NO ORGANISMS SEEN     Performed at [facility]   Culture     Final   Value: FEW STAPHYLOCOCCUS AUREUS     Performed at [facility]   Report Status PENDING   Incomplete  ANAEROBIC CULTURE     Status: None   Collection Time    01/07/14  1:12 PM      Result Value Ref Range Status   Specimen Description TISSUE LEG LEFT   Final   Special Requests DEEP LEFT TIBIA   Final   Gram Stain     Final   Value: RARE WBC PRESENT, PREDOMINANTLY PMN     NO ORGANISMS SEEN     Performed at [facility]   Culture     Final   Value: NO ANAEROBES ISOLATED; CULTURE IN PROGRESS FOR 5 DAYS     Performed at [facility]   Report Status PENDING   Incomplete  AFB CULTURE WITH SMEAR     Status: None   Collection Time    01/07/14  1:12 PM      Result Value Ref Range Status   Specimen Description TISSUE LEG LEFT   Final    Special Requests DEEP LEFT TIBIA   Final   Acid Fast Smear     Final   Value: NO ACID FAST BACILLI SEEN     Performed at [facility]   Culture     Final   Value: CULTURE WILL BE EXAMINED FOR 6 WEEKS BEFORE ISSUING A FINAL REPORT     Performed at [facility]   Report Status PENDING   Incomplete  FUNGUS CULTURE W SMEAR     Status: None   Collection Time    01/07/14  1:12 PM      Result Value Ref Range Status   Specimen Description TISSUE LEG LEFT   Final   Special Requests DEEP LEFT TIBIA   Final   Fungal Smear     Final   Value: NO YEAST OR FUNGAL ELEMENTS SEEN     Performed at [facility]   Culture     Final   Value: CULTURE IN PROGRESS FOR FOUR WEEKS     Performed at [facility]   Report Status PENDING   Incomplete    Studies/Results: No results found.   Assessment/Plan: Chronic osteomyelitis LLE  Total days of antibiotics: 3 vanco/zosyn  Would check his esr and crp next blood draw.  Would stop zosyn, deep Cx growing staph aureus. Plan for long term anbx (PCIC in place) Available if questions on 7-26 ( I will check his Cx and make any changes necessary).          Bobby Rumpf Infectious Diseases (pager) [phone] [email or web address] 01/09/2014, 4:00 PM  LOS: 2 days   **Disclaimer: This note may have been dictated with voice recognition software. Similar sounding words can inadvertently be transcribed and this note may contain transcription errors which may not have been corrected upon publication of note.**

## 2014-01-09 NOTE — Progress Notes (Signed)
[facility] PROGRESS NOTE  Samuel [name] MVH:[id number] DOB: Apr 03, 1951 DOA: 01/07/2014 PCP: Pearson GrippeKIM, JAMES, MD  Assessment/Plan: Diabetic Gastroparesis  -Patient with history of diabetic gastroparesis, having a Gastric Emptying Scan on 07/03/2010 that showed delayed solid phase gastric emptying suggesting gastroparesis  -Continue Reglan 10 mg  -Continue Phenergan when necessary, follow Type II Diabetes Mellitus  Continue home meds -sliding scale  Dyslipidemia.  -On statin therapy  HTN  -home meds  Left tibial ulcer/chronic  osteomyelitis -per primary team, status post surgery   Code Status: full Family Communication: None at bedside Disposition Plan: Per primary team    Procedures: PROCEDURES:  1. Removal of deep implant from the left tibia.  2. Left tibial sequestrectomy.  3. Irrigation and debridement of left tibial wound.  4. Application of wound VAC to residual tibial wound measuring 1 cm x  2 cm x 1 cm-per orthopedics    HPI/Subjective: Feeling better today, tolerating by mouth's  Objective: Filed Vitals:   01/09/14 0502  BP: 139/64  Pulse: 90  Temp: 98.3 F (36.8 C)  Resp: 16    Intake/Output Summary (Last 24 hours) at 01/09/14 1257 Last data filed at 01/09/14 0912  Gross per 24 hour  Intake 1388.75 ml  Output   1075 ml  Net 313.75 ml   Filed Weights   01/07/14 1106 01/07/14 1920  Weight: 138.347 kg (305 lb) 148.145 kg (326 lb 9.6 oz)    Exam:  General: alert & oriented x 3 In NAD Cardiovascular: RRR, nl S1 s2 Respiratory: CTAB Abdomen: soft +BS NT/ND, no masses palpable Extremities: No cyanosis and no edema    Data Reviewed: Basic Metabolic Panel:  Recent Labs Lab 01/05/14 1335 01/09/14 0500  NA 140 138  K 5.1 4.1  CL 102 98  CO2 24 25  GLUCOSE 155* 124*  BUN 18 16  CREATININE 1.19 1.29  CALCIUM 9.5 8.6   Liver Function Tests: No results found for this basename: AST, ALT, ALKPHOS, BILITOT, PROT, ALBUMIN,  in the last 168  hours No results found for this basename: LIPASE, AMYLASE,  in the last 168 hours No results found for this basename: AMMONIA,  in the last 168 hours CBC:  Recent Labs Lab 01/05/14 1335  WBC 6.0  HGB 14.9  HCT 45.5  MCV 95.2  PLT 190   Cardiac Enzymes: No results found for this basename: CKTOTAL, CKMB, CKMBINDEX, TROPONINI,  in the last 168 hours BNP (last 3 results) No results found for this basename: PROBNP,  in the last 8760 hours CBG:  Recent Labs Lab 01/08/14 1200 01/08/14 1702 01/08/14 2200 01/09/14 0736 01/09/14 1200  GLUCAP 118* 122* 131* 133* 109*    Recent Results (from the past 240 hour(s))  TISSUE CULTURE     Status: None   Collection Time    01/07/14  1:12 PM      Result Value Ref Range Status   Specimen Description TISSUE LEG LEFT   Final   Special Requests DEEP LEFT TIBIA   Final   Gram Stain     Final   Value: RARE WBC PRESENT, PREDOMINANTLY PMN     NO ORGANISMS SEEN     [facility]formed [facility]   Culture     Final   Value: FEW STAPHYLOCOCCUS AUREUS     [facility]formed at [facility]   Report Status PENDING   Incomplete  ANAEROBIC CULTURE     Status: None   Collection Time    01/07/14  1:12  PM      Result Value Ref Range Status   Specimen Description TISSUE LEG LEFT   Final   Special Requests DEEP LEFT TIBIA   Final   Gram Stain     Final   Value: RARE WBC PRESENT, PREDOMINANTLY PMN     NO ORGANISMS SEEN     Performed at [facility]   Culture     Final   Value: NO ANAEROBES ISOLATED; CULTURE IN PROGRESS FOR 5 DAYS     Performed at [facility]   Report Status PENDING   Incomplete  AFB CULTURE WITH SMEAR     Status: None   Collection Time    01/07/14  1:12 PM      Result Value Ref Range Status   Specimen Description TISSUE LEG LEFT   Final   Special Requests DEEP LEFT TIBIA   Final   Acid Fast Smear     Final   Value: NO ACID FAST BACILLI SEEN     Performed at [facility]   Culture     Final    Value: CULTURE WILL BE EXAMINED FOR 6 WEEKS BEFORE ISSUING A FINAL REPORT     Performed at [facility]   Report Status PENDING   Incomplete  FUNGUS CULTURE W SMEAR     Status: None   Collection Time    01/07/14  1:12 PM      Result Value Ref Range Status   Specimen Description TISSUE LEG LEFT   Final   Special Requests DEEP LEFT TIBIA   Final   Fungal Smear     Final   Value: NO YEAST OR FUNGAL ELEMENTS SEEN     Performed at [facility]   Culture     Final   Value: CULTURE IN PROGRESS FOR FOUR WEEKS     Performed at [facility]   Report Status PENDING   Incomplete     Studies: No results found.  Scheduled Meds: . aspirin  81 mg Oral QHS  . Canagliflozin  100 mg Oral QPM  . docusate sodium  100 mg Oral QPM  . famotidine  20 mg Oral Daily  . ferrous sulfate  325 mg Oral QPM  . gabapentin  300 mg Oral TID  . insulin aspart  0-15 Units Subcutaneous TID WC  . insulin aspart  0-5 Units Subcutaneous QHS  . linagliptin  5 mg Oral Daily  . metoCLOPramide  10 mg Oral TID AC & HS  . pantoprazole  40 mg Oral Daily  . pioglitazone  30 mg Oral QHS  . piperacillin-tazobactam (ZOSYN)  IV  3.375 g Intravenous 3 times per day  . senna  2 tablet Oral BID  . simvastatin  60 mg Oral QPM  . vancomycin (VANCOCIN) 1250 mg IVPB  1,250 mg Intravenous Q12H   Continuous Infusions: . sodium chloride Stopped (01/08/14 1427)    Principal Problem:   Chronic osteomyelitis involving lower leg Active Problems:   OBSTRUCTIVE SLEEP APNEA   Ulcer of lower limb, unspecified   Gastroparesis   DM (diabetes mellitus), type 2    Time spent: 25    [facility]  [facility] Pager [phone]. If 7PM-7AM, please contact night-coverage at [email or web address], password [facility] 01/09/2014, 12:57 PM  LOS: 2 days

## 2014-01-09 NOTE — Progress Notes (Signed)
Subjective: 2 Days Post-Op Procedure(s) (LRB): LEFT LEG REMOVAL OF DEEP IMPLANT AND SEQUESTRECTOMY; APPLICATION OF WOUND VAC  (Left) 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 (Left) Patient reports pain as mild. C/o L lower leg pain, improved since yesterday. Pain medication making him slightly nauseous today. Wanting to get out of bed. No other c/o this AM. He does have hx of neuropathy and notes no new numbness or tingling in his feet.  Objective: Vital signs in last 24 hours: Temp:  [97.6 F (36.4 C)-98.9 F (37.2 C)] 98.3 F (36.8 C) (07/25 0502) Pulse Rate:  [90-105] 90 (07/25 0502) Resp:  [16-20] 16 (07/25 0502) BP: (116-139)/(59-66) 139/64 mmHg (07/25 0502) SpO2:  [91 %-96 %] 95 % (07/25 0502)  Intake/Output from previous day: 07/24 0701 - 07/25 0700 In: 1268.8 [P.O.:480; I.V.:688.8; IV Piggyback:100] Out: 1475 [Urine:1375; Drains:100] Intake/Output this shift:    No results found for this basename: HGB,  in the last 72 hours No results found for this basename: WBC, RBC, HCT, PLT,  in the last 72 hours  Recent Labs  01/09/14 0500  NA 138  K 4.1  CL 98  CO2 25  BUN 16  CREATININE 1.29  GLUCOSE 124*  CALCIUM 8.6   No results found for this basename: LABPT, INR,  in the last 72 hours  Neurologically intact ABD soft Neurovascular intact Sensation intact distally Dorsiflexion/Plantar flexion intact Incision: dressing C/D/I and no drainage No cellulitis present Compartment soft no calf pain or sign of DVT Wound vac in place Decreased sensation L foot due to neuropathy  Assessment/Plan: 2 Days Post-Op Procedure(s) (LRB): LEFT LEG REMOVAL OF DEEP IMPLANT AND SEQUESTRECTOMY; APPLICATION OF WOUND VAC  (Left) 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 (Left) Advance diet Up with therapy may WBAT in CAM walker Continue IV abx- vanco and zosyn per ID, Dr. Orvan Falconerampbell Per Dr. Victorino DikeHewitt plan to change wound vac Monday, possible D/C Monday pending  cx  Collyns Mcquigg M. 01/09/2014, 7:58 AM

## 2014-01-10 LAB — GLUCOSE, CAPILLARY
Glucose-Capillary: 124 mg/dL — ABNORMAL HIGH (ref 70–99)
Glucose-Capillary: 156 mg/dL — ABNORMAL HIGH (ref 70–99)
Glucose-Capillary: 183 mg/dL — ABNORMAL HIGH (ref 70–99)
Glucose-Capillary: 123 mg/dL — ABNORMAL HIGH (ref 70–99)

## 2014-01-10 LAB — C-REACTIVE PROTEIN: CRP: 8.2 mg/dL — AB (ref <0.60)

## 2014-01-10 LAB — SEDIMENTATION RATE: SED RATE: 47 mm/h — AB (ref 0–16)

## 2014-01-10 LAB — HIV ANTIBODY (ROUTINE TESTING W REFLEX): HIV: NONREACTIVE

## 2014-01-10 MED ORDER — DIPHENHYDRAMINE HCL 25 MG PO CAPS
25.0000 mg | ORAL_CAPSULE | Freq: Once | ORAL | Status: AC
  Administered 2014-01-10: 25 mg via ORAL
  Filled 2014-01-10: qty 1

## 2014-01-10 MED ORDER — POLYETHYLENE GLYCOL 3350 17 G PO PACK
17.0000 g | PACK | Freq: Every day | ORAL | Status: DC
  Administered 2014-01-10: 17 g via ORAL
  Filled 2014-01-10 (×2): qty 1

## 2014-01-10 MED ORDER — BISACODYL 10 MG RE SUPP
10.0000 mg | Freq: Once | RECTAL | Status: AC
  Administered 2014-01-10: 10 mg via RECTAL
  Filled 2014-01-10: qty 1

## 2014-01-10 MED ORDER — PREGABALIN 75 MG PO CAPS
75.0000 mg | ORAL_CAPSULE | Freq: Two times a day (BID) | ORAL | Status: DC
  Administered 2014-01-10 – 2014-01-11 (×4): 75 mg via ORAL
  Filled 2014-01-10 (×4): qty 1

## 2014-01-10 NOTE — Consult Note (Signed)
WOC wound consult note Reason for Consult:Obtain supplies for NPWT dressing change tomorrow.  MD Victorino Dike(Hewitt) has requested a small granufoam (black) sponge to the unit for lunchtime dressing change. Wound type:Surgical, infectious Pressure Ulcer POA: No Requested supply is ordered, obtained and taken to patient's room.  One of my partners will call Dr. Victorino DikeHewitt in the am to arrange a time to meet at the bedside for first surgical dressing change and replacement of NPWT dressing/therapy. Thanks, Ladona MowLaurie Dalilah Curlin, MSN, RN, GNP, [place], CWON-AP [phone])

## 2014-01-10 NOTE — Progress Notes (Signed)
Patient showed symptoms of hot, sweating, and itching after Vacomycin was started.  Received order to give 25mg  of Benadryl and APAP.  Benadryl has helped some, but patient is refusing to take the Vancomycin.

## 2014-01-10 NOTE — Progress Notes (Signed)
Call placed to On call MD for Ortho for something for patient's bowels. He is very uncomfortable and distended. Call received from Dr. Durwin Noraixon- orders obtained. Trina Aoarla Gordon Carlson, RN

## 2014-01-10 NOTE — Progress Notes (Signed)
[facility] PROGRESS NOTE  Samuel BusingJohn D Maddy ZOX:[id number] DOB: [birth year] DOA: 01/07/2014 PCP: Pearson GrippeKIM, JAMES, MD  Assessment/Plan: Diabetic Gastroparesis  -Patient with history of diabetic gastroparesis, having a Gastric Emptying Scan on 07/03/2010 that showed delayed solid phase gastric emptying suggesting gastroparesis  -Continue Reglan 10 mg  -Continue Phenergan when necessary, follow Type II Diabetes Mellitus  Continue home meds -sliding scale  Dyslipidemia.  -On statin therapy  HTN  -home meds  Left tibial ulcer/chronic  osteomyelitis -per primary team, status post surgery   Code Status: full Family Communication: None at bedside Disposition Plan: Per primary team    Procedures: PROCEDURES:  1. Removal of deep implant from the left tibia.  2. Left tibial sequestrectomy.  3. Irrigation and debridement of left tibial wound.  4. Application of wound VAC to residual tibial wound measuring 1 cm x  2 cm x 1 cm-per orthopedics    HPI/Subjective: Some nausea, but tolerating by mouth's  Objective: Filed Vitals:   01/10/14 0625  BP: 141/72  Pulse: 88  Temp: 97.6 F (36.4 C)  Resp: 18    Intake/Output Summary (Last 24 hours) at 01/10/14 1441 Last data filed at 01/10/14 1329  Gross per 24 hour  Intake    840 ml  Output   1275 ml  Net   -435 ml   Filed Weights   01/07/14 1106 01/07/14 1920  Weight: 138.347 kg (305 lb) 148.145 kg (326 lb 9.6 oz)    Exam:  General: alert & oriented x 3 In NAD. Sitting in chair Cardiovascular: RRR, nl S1 s2 Respiratory: CTAB Abdomen: +BS soft NT/ND, no masses palpable Extremities: No cyanosis and no edema, LLE in immobilzer    Data Reviewed: Basic Metabolic Panel:  Recent Labs Lab 01/05/14 1335 01/09/14 0500  NA 140 138  K 5.1 4.1  CL 102 98  CO2 24 25  GLUCOSE 155* 124*  BUN 18 16  CREATININE 1.19 1.29  CALCIUM 9.5 8.6   Liver Function Tests: No results found for this basename: AST, ALT, ALKPHOS, BILITOT,  PROT, ALBUMIN,  in the last 168 hours No results found for this basename: LIPASE, AMYLASE,  in the last 168 hours No results found for this basename: AMMONIA,  in the last 168 hours CBC:  Recent Labs Lab 01/05/14 1335  WBC 6.0  HGB 14.9  HCT 45.5  MCV 95.2  PLT 190   Cardiac Enzymes: No results found for this basename: CKTOTAL, CKMB, CKMBINDEX, TROPONINI,  in the last 168 hours BNP (last 3 results) No results found for this basename: PROBNP,  in the last 8760 hours CBG:  Recent Labs Lab 01/09/14 1200 01/09/14 1644 01/09/14 2201 01/10/14 0734 01/10/14 1144  GLUCAP 109* 129* 134* 124* 156*    Recent Results (from the past 240 hour(s))  TISSUE CULTURE     Status: None   Collection Time    01/07/14  1:12 PM      Result Value Ref Range Status   Specimen Description TISSUE LEG LEFT   Final   Special Requests DEEP LEFT TIBIA   Final   Gram Stain     Final   Value: RARE WBC PRESENT, PREDOMINANTLY PMN     NO ORGANISMS SEEN     Performed at [facility]   Culture     Final   Value: FEW STAPHYLOCOCCUS AUREUS     Note: RIFAMPIN AND GENTAMICIN SHOULD NOT BE USED AS SINGLE DRUGS FOR TREATMENT OF STAPH INFECTIONS.     Performed  at [facility]   Report Status PENDING   Incomplete  ANAEROBIC CULTURE     Status: None   Collection Time    01/07/14  1:12 PM      Result Value Ref Range Status   Specimen Description TISSUE LEG LEFT   Final   Special Requests DEEP LEFT TIBIA   Final   Gram Stain     Final   Value: RARE WBC PRESENT, PREDOMINANTLY PMN     NO ORGANISMS SEEN     Performed at [facility]   Culture     Final   Value: NO ANAEROBES ISOLATED; CULTURE IN PROGRESS FOR 5 DAYS     Performed at [facility]   Report Status PENDING   Incomplete  AFB CULTURE WITH SMEAR     Status: None   Collection Time    01/07/14  1:12 PM      Result Value Ref Range Status   Specimen Description TISSUE LEG LEFT   Final   Special Requests DEEP LEFT TIBIA    Final   Acid Fast Smear     Final   Value: NO ACID FAST BACILLI SEEN     Performed at [facility]   Culture     Final   Value: CULTURE WILL BE EXAMINED FOR 6 WEEKS BEFORE ISSUING A FINAL REPORT     Performed at [facility]   Report Status PENDING   Incomplete  FUNGUS CULTURE W SMEAR     Status: None   Collection Time    01/07/14  1:12 PM      Result Value Ref Range Status   Specimen Description TISSUE LEG LEFT   Final   Special Requests DEEP LEFT TIBIA   Final   Fungal Smear     Final   Value: NO YEAST OR FUNGAL ELEMENTS SEEN     Performed at [facility]   Culture     Final   Value: CULTURE IN PROGRESS FOR FOUR WEEKS     Performed at [facility]   Report Status PENDING   Incomplete     Studies: No results found.  Scheduled Meds: . aspirin  81 mg Oral QHS  . Canagliflozin  100 mg Oral QPM  . docusate sodium  100 mg Oral QPM  . famotidine  20 mg Oral Daily  . ferrous sulfate  325 mg Oral QPM  . insulin aspart  0-15 Units Subcutaneous TID WC  . insulin aspart  0-5 Units Subcutaneous QHS  . linagliptin  5 mg Oral Daily  . metoCLOPramide  10 mg Oral TID AC & HS  . pantoprazole  40 mg Oral Daily  . pioglitazone  30 mg Oral QHS  . pregabalin  75 mg Oral BID  . senna  2 tablet Oral BID  . simvastatin  60 mg Oral QPM  . vancomycin (VANCOCIN) 1250 mg IVPB  1,250 mg Intravenous Q12H   Continuous Infusions:    Principal Problem:   Chronic osteomyelitis involving lower leg Active Problems:   OBSTRUCTIVE SLEEP APNEA   Ulcer of lower limb, unspecified   Gastroparesis   DM (diabetes mellitus), type 2    Time spent: 25    [facility] C  [facility] Pager [phone]. If 7PM-7AM, please contact night-coverage at [email or web address], password [facility] 01/10/2014, 2:41 PM  LOS: 3 days

## 2014-01-10 NOTE — Progress Notes (Signed)
Subjective: 3 Days Post-Op Procedure(s) (LRB): LEFT LEG REMOVAL OF DEEP IMPLANT AND SEQUESTRECTOMY; APPLICATION OF WOUND VAC  (Left) 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 (Left) Patient reports pain as mild.  Walking in cam boot.  No BM yet.  Nausea is largely resolved.  Dr. Moshe CiproHatcher's notes reviewed.  Objective: Vital signs in last 24 hours: Temp:  [97.6 F (36.4 C)-98.4 F (36.9 C)] 97.6 F (36.4 C) (07/26 0625) Pulse Rate:  [88-92] 88 (07/26 0625) Resp:  [16-19] 18 (07/26 0625) BP: (123-141)/(68-72) 141/72 mmHg (07/26 0625) SpO2:  [91 %-96 %] 91 % (07/26 0625)  Intake/Output from previous day: 07/25 0701 - 07/26 0700 In: 840 [P.O.:840] Out: 1000 [Urine:1000] Intake/Output this shift:    No results found for this basename: HGB,  in the last 72 hours No results found for this basename: WBC, RBC, HCT, PLT,  in the last 72 hours  Recent Labs  01/09/14 0500  NA 138  K 4.1  CL 98  CO2 25  BUN 16  CREATININE 1.29  GLUCOSE 124*  CALCIUM 8.6   No results found for this basename: LABPT, INR,  in the last 72 hours  PE:  wn wd male in nad.  L LE with cam boot and wound vac.  Toes with brisk cap refill.  Assessment/Plan: 3 Days Post-Op Procedure(s) (LRB): LEFT LEG REMOVAL OF DEEP IMPLANT AND SEQUESTRECTOMY; APPLICATION OF WOUND VAC  (Left) 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 (Left) Discharge home with home health hopefully tomorrow.  Plan wound VAC change at lunch tomorrow.  Toni ArthursHEWITT, Ester 01/10/2014, 8:11 AM

## 2014-01-10 NOTE — Progress Notes (Signed)
INFECTIOUS DISEASE PROGRESS NOTE  ID: Samuel [name] is a 63 y.o. male with  Principal Problem:   Chronic osteomyelitis involving lower leg Active Problems:   OBSTRUCTIVE SLEEP APNEA   Ulcer of lower limb, unspecified   Gastroparesis   DM (diabetes mellitus), type 2  Subjective: Without complaints Leg hurting after ambulating.   Abtx:  Anti-infectives   Start     Dose/Rate Route Frequency Ordered Stop   01/07/14 2200  piperacillin-tazobactam (ZOSYN) IVPB 3.375 g  Status:  Discontinued     3.375 g 12.5 mL/hr over 240 Minutes Intravenous 3 times per day 01/07/14 2023 01/09/14 1607   01/07/14 2100  vancomycin (VANCOCIN) 1,250 mg in sodium chloride 0.9 % 250 mL IVPB     1,250 mg 166.7 mL/hr over 90 Minutes Intravenous Every 12 hours 01/07/14 1750     01/07/14 1337  vancomycin (VANCOCIN) powder  Status:  Discontinued       As needed 01/07/14 1433 01/07/14 1516   01/07/14 1337  gentamicin (GARAMYCIN) injection  Status:  Discontinued       As needed 01/07/14 1435 01/07/14 1516   01/07/14 1330  vancomycin (VANCOCIN) 1,500 mg in sodium chloride 0.9 % 500 mL IVPB  Status:  Discontinued     1,500 mg 250 mL/hr over 120 Minutes Intravenous To Surgery 01/07/14 1315 01/07/14 1855      Medications:  Scheduled: . aspirin  81 mg Oral QHS  . Canagliflozin  100 mg Oral QPM  . docusate sodium  100 mg Oral QPM  . famotidine  20 mg Oral Daily  . ferrous sulfate  325 mg Oral QPM  . insulin aspart  0-15 Units Subcutaneous TID WC  . insulin aspart  0-5 Units Subcutaneous QHS  . linagliptin  5 mg Oral Daily  . metoCLOPramide  10 mg Oral TID AC & HS  . pantoprazole  40 mg Oral Daily  . pioglitazone  30 mg Oral QHS  . pregabalin  75 mg Oral BID  . senna  2 tablet Oral BID  . simvastatin  60 mg Oral QPM  . vancomycin (VANCOCIN) 1250 mg IVPB  1,250 mg Intravenous Q12H    Objective: Vital signs in last 24 hours: Temp:  [97.6 F (36.4 C)-97.8 F (36.6 C)] 97.6 F (36.4 C) (07/26  0625) Pulse Rate:  [88] 88 (07/26 0625) Resp:  [18-19] 18 (07/26 0625) BP: (123-141)/(68-72) 141/72 mmHg (07/26 0625) SpO2:  [91 %] 91 % (07/26 0625)  LLE wrapped, drain in place.   Lab Results  Recent Labs  01/09/14 0500  NA 138  K 4.1  CL 98  CO2 25  BUN 16  CREATININE 1.29   Liver Panel No results found for this basename: PROT, ALBUMIN, AST, ALT, ALKPHOS, BILITOT, BILIDIR, IBILI,  in the last 72 hours Sedimentation Rate  Recent Labs  01/10/14 0535  ESRSEDRATE 47*   C-Reactive Protein  Recent Labs  01/10/14 0535  CRP 8.2*    Microbiology: Recent Results (from the past 240 hour(s))  TISSUE CULTURE     Status: None   Collection Time    01/07/14  1:12 PM      Result Value Ref Range Status   Specimen Description TISSUE LEG LEFT   Final   Special Requests DEEP LEFT TIBIA   Final   Gram Stain     Final   Value: RARE WBC PRESENT, PREDOMINANTLY PMN     NO ORGANISMS SEEN     Performed at [facility]  Culture     Final   Value: FEW STAPHYLOCOCCUS AUREUS     Note: RIFAMPIN AND GENTAMICIN SHOULD NOT BE USED AS SINGLE DRUGS FOR TREATMENT OF STAPH INFECTIONS.     Performed at [facility]   Report Status PENDING   Incomplete  ANAEROBIC CULTURE     Status: None   Collection Time    01/07/14  1:12 PM      Result Value Ref Range Status   Specimen Description TISSUE LEG LEFT   Final   Special Requests DEEP LEFT TIBIA   Final   Gram Stain     Final   Value: RARE WBC PRESENT, PREDOMINANTLY PMN     NO ORGANISMS SEEN     Performed at [facility]   Culture     Final   Value: NO ANAEROBES ISOLATED; CULTURE IN PROGRESS FOR 5 DAYS     Performed at [facility]   Report Status PENDING   Incomplete  AFB CULTURE WITH SMEAR     Status: None   Collection Time    01/07/14  1:12 PM      Result Value Ref Range Status   Specimen Description TISSUE LEG LEFT   Final   Special Requests DEEP LEFT TIBIA   Final   Acid Fast Smear     Final    Value: NO ACID FAST BACILLI SEEN     Performed at [facility]   Culture     Final   Value: CULTURE WILL BE EXAMINED FOR 6 WEEKS BEFORE ISSUING A FINAL REPORT     Performed at [facility]   Report Status PENDING   Incomplete  FUNGUS CULTURE W SMEAR     Status: None   Collection Time    01/07/14  1:12 PM      Result Value Ref Range Status   Specimen Description TISSUE LEG LEFT   Final   Special Requests DEEP LEFT TIBIA   Final   Fungal Smear     Final   Value: NO YEAST OR FUNGAL ELEMENTS SEEN     Performed at [facility]   Culture     Final   Value: CULTURE IN PROGRESS FOR FOUR WEEKS     Performed at [facility]   Report Status PENDING   Incomplete    Studies/Results: No results found.   Assessment/Plan: Chronic osteomyelitis LLE  Total days of antibiotics: 4 vanco  ESR, CRP elevated.  Spoke with lab, the sensi on his staph aureus will not be out til tomorrow Will change his anbx in AM based on these results.  He has PIC Will set up f/u in ID clinic.          Bobby Rumpf Infectious Diseases (pager) [phone] [email or web address] 01/10/2014, 2:12 PM  LOS: 3 days   **Disclaimer: This note may have been dictated with voice recognition software. Similar sounding words can inadvertently be transcribed and this note may contain transcription errors which may not have been corrected upon publication of note.**

## 2014-01-11 DIAGNOSIS — A4901 Methicillin susceptible Staphylococcus aureus infection, unspecified site: Secondary | ICD-10-CM

## 2014-01-11 LAB — GLUCOSE, CAPILLARY
GLUCOSE-CAPILLARY: 142 mg/dL — AB (ref 70–99)
Glucose-Capillary: 128 mg/dL — ABNORMAL HIGH (ref 70–99)

## 2014-01-11 LAB — TISSUE CULTURE

## 2014-01-11 MED ORDER — CEFAZOLIN SODIUM-DEXTROSE 2-3 GM-% IV SOLR
2.0000 g | Freq: Three times a day (TID) | INTRAVENOUS | Status: DC
  Filled 2014-01-11 (×2): qty 50

## 2014-01-11 MED ORDER — HYDROCODONE-ACETAMINOPHEN 5-325 MG PO TABS: 1.0000 | ORAL_TABLET | Freq: Four times a day (QID) | ORAL | Status: DC | PRN

## 2014-01-11 MED ORDER — CEFAZOLIN SODIUM 1-5 GM-% IV SOLN
1.0000 g | Freq: Three times a day (TID) | INTRAVENOUS | Status: DC
  Administered 2014-01-11: 1 g via INTRAVENOUS
  Filled 2014-01-11 (×2): qty 50

## 2014-01-11 MED ORDER — CEFAZOLIN SODIUM-DEXTROSE 2-3 GM-% IV SOLR: 2.0000 g | Freq: Three times a day (TID) | INTRAVENOUS | Status: DC

## 2014-01-11 NOTE — Progress Notes (Signed)
Patient ID: Samuel [name], male   DOB: [birth year], 63 y.o.   MRN: [id number]         [facility]    Date of Admission:  01/07/2014   Total days of antibiotics 5         Principal Problem:   Chronic osteomyelitis involving lower leg Active Problems:   OBSTRUCTIVE SLEEP APNEA   Ulcer of lower limb, unspecified   Gastroparesis   DM (diabetes mellitus), type 2   . aspirin  81 mg Oral QHS  . Canagliflozin  100 mg Oral QPM  .  ceFAZolin (ANCEF) IV  2 g Intravenous Q8H  . docusate sodium  100 mg Oral QPM  . famotidine  20 mg Oral Daily  . ferrous sulfate  325 mg Oral QPM  . insulin aspart  0-15 Units Subcutaneous TID WC  . insulin aspart  0-5 Units Subcutaneous QHS  . linagliptin  5 mg Oral Daily  . metoCLOPramide  10 mg Oral TID AC & HS  . pantoprazole  40 mg Oral Daily  . pioglitazone  30 mg Oral QHS  . polyethylene glycol  17 g Oral Daily  . pregabalin  75 mg Oral BID  . senna  2 tablet Oral BID  . simvastatin  60 mg Oral QPM    Subjective: Samuel [name] is feeling better and eating her to go home soon.  Objective: Temp:  [97.6 F (36.4 C)-97.9 F (36.6 C)] 97.9 F (36.6 C) (07/27 0521) Pulse Rate:  [88-98] 98 (07/27 0521) Resp:  [17-20] 17 (07/27 0521) BP: (123-154)/(62-73) 123/62 mmHg (07/27 0521) SpO2:  [94 %-98 %] 98 % (07/27 0521)  General: Samuel [name] is in good spirits Skin: New left arm PICC Left leg wrapped  Lab Results Lab Results  Component Value Date   WBC 6.0 01/05/2014   HGB 14.9 01/05/2014   HCT 45.5 01/05/2014   MCV 95.2 01/05/2014   PLT 190 01/05/2014    Lab Results  Component Value Date   CREATININE 1.29 01/09/2014   BUN 16 01/09/2014   NA 138 01/09/2014   K 4.1 01/09/2014   CL 98 01/09/2014   CO2 25 01/09/2014    Lab Results  Component Value Date   ALT 21 12/20/2013   AST 24 12/20/2013   ALKPHOS 60 12/20/2013   BILITOT 0.6 12/20/2013      Microbiology: Recent Results (from the past 240 hour(s))  TISSUE CULTURE     Status: None   Collection Time    01/07/14  1:12 PM      Result Value Ref Range Status   Specimen Description TISSUE LEG LEFT   Final   Special Requests DEEP LEFT TIBIA   Final   Gram Stain     Final   Value: RARE WBC PRESENT, PREDOMINANTLY PMN     NO ORGANISMS SEEN     Performed at [facility]   Culture     Final   Value: FEW STAPHYLOCOCCUS AUREUS     Note: RIFAMPIN AND GENTAMICIN SHOULD NOT BE USED AS SINGLE DRUGS FOR TREATMENT OF STAPH INFECTIONS.     Performed at [facility]   Report Status 01/11/2014 FINAL   Final   Organism ID, Bacteria STAPHYLOCOCCUS AUREUS   Final  ANAEROBIC CULTURE     Status: None   Collection Time    01/07/14  1:12 PM      Result Value Ref Range Status   Specimen Description TISSUE LEG LEFT  Final   Special Requests DEEP LEFT TIBIA   Final   Gram Stain     Final   Value: RARE WBC PRESENT, PREDOMINANTLY PMN     NO ORGANISMS SEEN     [facility]   Culture     Final   Value: NO ANAEROBES ISOLATED; CULTURE IN PROGRESS FOR 5 DAYS     [facility]   Report Status PENDING   Incomplete  AFB CULTURE WITH SMEAR     Status: None   Collection Time    01/07/14  1:12 PM      Result Value Ref Range Status   Specimen Description TISSUE LEG LEFT   Final   Special Requests DEEP LEFT TIBIA   Final   Acid Fast Smear     Final   Value: NO ACID FAST BACILLI SEEN     [facility]   Culture     Final   Value: CULTURE WILL BE EXAMINED FOR 6 WEEKS BEFORE ISSUING A FINAL REPORT     [facility]   Report Status PENDING   Incomplete  FUNGUS CULTURE W SMEAR     Status: None   Collection Time    01/07/14  1:12 PM      Result Value Ref Range Status   Specimen Description TISSUE LEG LEFT   Final   Special Requests DEEP LEFT TIBIA   Final   Fungal Smear     Final   Value: NO YEAST OR FUNGAL ELEMENTS SEEN     [facility]   Culture     Final   Value: CULTURE IN  PROGRESS FOR FOUR WEEKS     [facility] at [facility]   Report Status PENDING   Incomplete    Studies/Results: No results found.  Assessment: Samuel [name] has chronic MSSA osteomyelitis of his left tibia. Hopefully Samuel [name] can now be cured once the bony sequestrum has been debrided.  Plan: 1. Continue cefazolin at least 6 weeks through September 2 2. I will sign off now and arrange followup in our clinic  Samuel AstersJohn Ilissa Rosner, MD [facility] [phone] pager   [phone] cell 01/11/2014, 12:42 PM

## 2014-01-11 NOTE — Discharge Summary (Signed)
Physician Discharge Summary  Patient ID: Samuel [name] MRN: [id number] DOB/AGE: 12-25-1950 63 y.o.  Admit date: 01/07/2014 Discharge date: 01/11/2014  Admission Diagnoses:  Left tibia osteomyelitis with chronic ulcer, diabetes, sleep apnea  Discharge Diagnoses:  Principal Problem:   Chronic osteomyelitis involving lower leg Active Problems:   OBSTRUCTIVE SLEEP APNEA   Ulcer of lower limb, unspecified   Gastroparesis   DM (diabetes mellitus), type 2 s/p I and D of sequestrum and wound VAC application  Discharged Condition: stable  Hospital Course: The patient was admitted on 7/23 and taken to the OR where he underwent removal of the im nail and i and d of the tibial sequestrum.  He also had a wound VAC placed on the resultant wound.  He tolerated the procedure well.  The cultures grew MSSA. He had a PICC line placed and HH was arranged for 6 weeks of ancef 2g q8h.  He will hae wound vac changes as well.  He is discharged to home in stable condition.  Consults: ID  Significant Diagnostic Studies: microbiology: tissue culture  Treatments: surgery: as above  Discharge Exam: Blood pressure 123/62, pulse 98, temperature 97.9 F (36.6 C), temperature source Oral, resp. rate 17, height 6\' 3"  (1.905 m), weight 148.145 kg (326 lb 9.6 oz), SpO2 98.00%. wn wd male in nad.  L LE wounds healing well.  WV changed today with Cammie Mcgeeawn Engels, RN.  No purulence noted.  Disposition: 01-Home or Self Care  Discharge Instructions   Call MD / Call 911    Complete by:  As directed   If you experience chest pain or shortness of breath, CALL 911 and be transported to the hospital emergency room.  If you develope a fever above 101 F, pus (white drainage) or increased drainage or redness at the wound, or calf pain, call your surgeon's office.     Constipation Prevention    Complete by:  As directed   Drink plenty of fluids.  Prune juice may be helpful.  You may use a stool softener, such as Colace (over the  counter) 100 mg twice a day.  Use MiraLax (over the counter) for constipation as needed.     Diet - low sodium heart healthy    Complete by:  As directed      Weight bearing as tolerated    Complete by:  As directed             Medication List         acetaminophen 500 MG tablet  Commonly known as:  TYLENOL  Take 500 mg by mouth every 8 (eight) hours as needed for moderate pain.     aspirin 81 MG chewable tablet  Chew 81 mg by mouth at bedtime.     ceFAZolin 2-3 GM-% Solr  Commonly known as:  ANCEF  Inject 50 mLs (2 g total) into the vein every 8 (eight) hours.     COLACE 100 MG capsule  Generic drug:  docusate sodium  Take 100 mg by mouth every evening.     EQ PAIN RELIEVER PM 25-500 MG Tabs  Generic drug:  diphenhydramine-acetaminophen  Take 2 tablets by mouth at bedtime.     ferrous sulfate 325 (65 FE) MG tablet  Take 325 mg by mouth every evening.     gabapentin 300 MG capsule  Commonly known as:  NEURONTIN  Take 300 mg by mouth 3 (three) times daily.     HYDROcodone-acetaminophen 5-325 MG per tablet  Commonly known  as:  NORCO/VICODIN  Take 1-2 tablets by mouth every 6 (six) hours as needed for moderate pain or severe pain.     insulin lispro 100 UNIT/ML injection  Commonly known as:  HUMALOG  Inject 4-7 Units into the skin 3 (three) times daily before meals. Sliding scale     INVOKANA 100 MG Tabs  Generic drug:  Canagliflozin  Take 100 mg by mouth every evening.     lansoprazole 30 MG capsule  Commonly known as:  PREVACID  Take 30 mg by mouth 2 (two) times daily.     metoCLOPramide 10 MG tablet  Commonly known as:  REGLAN  Take 10 mg by mouth 4 (four) times daily.     naproxen sodium 220 MG tablet  Commonly known as:  ANAPROX  Take 220 mg by mouth 2 (two) times daily as needed (pain).     pioglitazone 30 MG tablet  Commonly known as:  ACTOS  Take 30 mg by mouth at bedtime.     pregabalin 300 MG capsule  Commonly known as:  LYRICA  Take 300 mg  by mouth 3 (three) times daily.     promethazine 25 MG tablet  Commonly known as:  PHENERGAN  Take 1 tablet (25 mg total) by mouth every 6 (six) hours as needed for nausea.     promethazine 25 MG suppository  Commonly known as:  PHENERGAN  Place 1 suppository (25 mg total) rectally every 6 (six) hours as needed for nausea or vomiting.     ranitidine 150 MG tablet  Commonly known as:  ZANTAC  Take 150 mg by mouth daily.     simvastatin 40 MG tablet  Commonly known as:  ZOCOR  Take 60 mg by mouth every evening. Take 1 and 1/2 tablets daily     sitaGLIPtin 100 MG tablet  Commonly known as:  JANUVIA  Take 100 mg by mouth every evening.           Follow-up Information   Follow up with Corean Yoshimura, Jonny Ruiz, MD. Schedule an appointment as soon as possible for a visit in 3 weeks.   Specialty:  Orthopedic Surgery   Contact information:   [address] [place] [address] [phone]       Signed: NORRIS, BRUMBACH 01/11/2014, 12:57 PM

## 2014-01-11 NOTE — Discharge Instructions (Signed)
Samuel ArthursJohn Leonell Lobdell, MD [facility]  Please read the following information regarding your care after surgery.  Medications  You only need a prescription for the narcotic pain medicine (ex. oxycodone, Percocet, Norco).  All of the other medicines listed below are available over the counter. X acetominophen (Tylenol) 650 mg every 4-6 hours as you need for minor pain OR X hydrocodone as prescribed for pain X Ancef IV by the home health RN for 6 weeks.   Narcotic pain medicine (ex. oxycodone, Percocet, Vicodin) will cause constipation.  To prevent this problem, take the following medicines while you are taking any pain medicine. X docusate sodium (Colace) 100 mg twice a day X senna (Senokot) 2 tablets twice a day  Weight Bearing X Bear weight when you are able on your operated leg or foot in the CAM boot.  Cast / Splint / Dressing Wound VAC changes by home health nurse.  After your dressing, cast or splint is removed; you may shower, but do not soak or scrub the wound.  Allow the water to run over it, and then gently pat it dry.  Swelling It is normal for you to have swelling where you had surgery.  To reduce swelling and pain, keep your toes above your nose for at least 3 days after surgery.  It may be necessary to keep your foot or leg elevated for several weeks.  If it hurts, it should be elevated.  Follow Up Call my office at [phone] when you are discharged from the hospital or surgery center to schedule an appointment to be seen two weeks after surgery.  Call my office at [phone] if you develop a fever >101.5 F, nausea, vomiting, bleeding from the surgical site or severe pain.

## 2014-01-11 NOTE — Consult Note (Addendum)
WOC wound consult note Reason for Consult: Requested to assist with vac application to left leg.  Dr Hewitt at bedside to assess wound during  first post-op dressing change. Wound type: Full thickness. Measurement: 13X4X.1cm with deeper section of middle wound: .5X.5X.5cm Wound bed: All wounds red and moist Drainage (amount, consistency, odor) Previous cannister with 300 cc blood-tinged drainage, no odor.  Currently there is a small amt red drainage from deeper wound. Periwound: Red and macerated to 2 cm surrounding wound Dressing procedure/placement/frequency: Applied one small piece of sponge to deeper wound using swab to fill.  Applied one piece Mepitel contact layer over wound bed and added 2 more piVCleda MRegino SchuVCleda MRegino VCleda MRegino SchultzecVCleda MRegino SchuVCleda MReVCleda MRegino SchultzVCleda MVCleda MRegino SchultzecreSky RidVCleda MRegino ScVCleda MReginoVCleda MRegino SchultzecreSouth Nassau CommunitVCleda MRegino SchultzecreLincoln Trail BVCleda MRegVCleda MRegino SchultzVCleda MRegino SchultzecreKaiserVCleda MRegino SchulVCleda MVCleda MRegino SchultzeVCleda MRegino SchultzVCleda MRVCleda MRegino ScVCleda MRegino SchultzecreSgt. JohnVCleda MRegino SchultzecreEye VCleda MRVCleda MRegino SchultzecreThe Neuromedical CeVCleda MReVCleda MRegino SchultzecreArnold Palmer Hospital For [id number] BireMethodist [id number]<MEASUREME4<MEASUREMENDollar GenB68RIGHTVibra Hos e vac machine and plans to discharge later today.  He states he has used a vac in the past at home and is familiar with troubleshooting and denies further questions. Please re-consult if further assistance is needed.  Thank-you,  Fannie Gathright MSN, RN, CWOCN, CWCN-AP, [phone]

## 2014-01-11 NOTE — Progress Notes (Signed)
AVS Discharge instructions were given and went over with patient. Patient was also given prescriptions for hydrocodone and ancef to take to his pharmacy. Now patient is waiting for his brother to come and pick him up.

## 2014-01-11 NOTE — Progress Notes (Signed)
[facility] PROGRESS NOTE  Samuel [name] WUJ:[id number] DOB: [birth year] DOA: 01/07/2014 PCP: Pearson GrippeKIM, JAMES, MD  Assessment/Plan: Diabetic Gastroparesis  -Patient with history of diabetic gastroparesis, having a Gastric Emptying Scan on 07/03/2010 that showed delayed solid phase gastric emptying suggesting gastroparesis  -Clinically improved -Continue Reglan 10 mg on discharge -as previously -Follow up with PCP outpatient Type II Diabetes Mellitus  Continue home meds upon discharge Dyslipidemia.  -Continue statin therapy  HTN  -home meds  Left tibial ulcer/chronic  osteomyelitis -per primary team, status post surgery   Code Status: full Family Communication: None at bedside Disposition Plan: Per primary team, okay for discharge from medicine standpoint    Procedures: PROCEDURES:  1. Removal of deep implant from the left tibia.  2. Left tibial sequestrectomy.  3. Irrigation and debridement of left tibial wound.  4. Application of wound VAC to residual tibial wound measuring 1 cm x  2 cm x 1 cm-per orthopedics    HPI/Subjective: Denies any new complaints, tolerating by mouth well  Objective: Filed Vitals:   01/11/14 0521  BP: 123/62  Pulse: 98  Temp: 97.9 F (36.6 C)  Resp: 17    Intake/Output Summary (Last 24 hours) at 01/11/14 1146 Last data filed at 01/11/14 1126  Gross per 24 hour  Intake    610 ml  Output   1420 ml  Net   -810 ml   Filed Weights   01/07/14 1106 01/07/14 1920  Weight: 138.347 kg (305 lb) 148.145 kg (326 lb 9.6 oz)    Exam:  General: alert & oriented x 3 In NAD. Sitting in chair Cardiovascular: RRR, nl S1 s2 Respiratory: CTAB Abdomen: +BS soft NT/ND, no masses palpable Extremities: No cyanosis and no edema, LLE Ace wrapped-dry and clean    Data Reviewed: Basic Metabolic Panel:  Recent Labs Lab 01/05/14 1335 01/09/14 0500  NA 140 138  K 5.1 4.1  CL 102 98  CO2 24 25  GLUCOSE 155* 124*  BUN 18 16  CREATININE 1.19 1.29   CALCIUM 9.5 8.6   Liver Function Tests: No results found for this basename: AST, ALT, ALKPHOS, BILITOT, PROT, ALBUMIN,  in the last 168 hours No results found for this basename: LIPASE, AMYLASE,  in the last 168 hours No results found for this basename: AMMONIA,  in the last 168 hours CBC:  Recent Labs Lab 01/05/14 1335  WBC 6.0  HGB 14.9  HCT 45.5  MCV 95.2  PLT 190   Cardiac Enzymes: No results found for this basename: CKTOTAL, CKMB, CKMBINDEX, TROPONINI,  in the last 168 hours BNP (last 3 results) No results found for this basename: PROBNP,  in the last 8760 hours CBG:  Recent Labs Lab 01/10/14 0734 01/10/14 1144 01/10/14 1644 01/10/14 2245 01/11/14 0740  GLUCAP 124* 156* 183* 123* 142*    Recent Results (from the past 240 hour(s))  TISSUE CULTURE     Status: None   Collection Time    01/07/14  1:12 PM      Result Value Ref Range Status   Specimen Description TISSUE LEG LEFT   Final   Special Requests DEEP LEFT TIBIA   Final   Gram Stain     Final   Value: RARE WBC PRESENT, PREDOMINANTLY PMN     NO ORGANISMS SEEN     [facility]formed at [facility]   Culture     Final   Value: FEW STAPHYLOCOCCUS AUREUS     Note: RIFAMPIN AND GENTAMICIN SHOULD NOT BE  USED AS SINGLE DRUGS FOR TREATMENT OF STAPH INFECTIONS.     Performed at [facility]   Report Status 01/11/2014 FINAL   Final   Organism ID, Bacteria STAPHYLOCOCCUS AUREUS   Final  ANAEROBIC CULTURE     Status: None   Collection Time    01/07/14  1:12 PM      Result Value Ref Range Status   Specimen Description TISSUE LEG LEFT   Final   Special Requests DEEP LEFT TIBIA   Final   Gram Stain     Final   Value: RARE WBC PRESENT, PREDOMINANTLY PMN     NO ORGANISMS SEEN     Performed at [facility]   Culture     Final   Value: NO ANAEROBES ISOLATED; CULTURE IN PROGRESS FOR 5 DAYS     Performed at [facility]   Report Status PENDING   Incomplete  AFB CULTURE WITH SMEAR     Status:  None   Collection Time    01/07/14  1:12 PM      Result Value Ref Range Status   Specimen Description TISSUE LEG LEFT   Final   Special Requests DEEP LEFT TIBIA   Final   Acid Fast Smear     Final   Value: NO ACID FAST BACILLI SEEN     Performed at [facility]   Culture     Final   Value: CULTURE WILL BE EXAMINED FOR 6 WEEKS BEFORE ISSUING A FINAL REPORT     Performed at [facility]   Report Status PENDING   Incomplete  FUNGUS CULTURE W SMEAR     Status: None   Collection Time    01/07/14  1:12 PM      Result Value Ref Range Status   Specimen Description TISSUE LEG LEFT   Final   Special Requests DEEP LEFT TIBIA   Final   Fungal Smear     Final   Value: NO YEAST OR FUNGAL ELEMENTS SEEN     Performed at [facility]   Culture     Final   Value: CULTURE IN PROGRESS FOR FOUR WEEKS     Performed at [facility]   Report Status PENDING   Incomplete     Studies: No results found.  Scheduled Meds: . aspirin  81 mg Oral QHS  . Canagliflozin  100 mg Oral QPM  .  ceFAZolin (ANCEF) IV  1 g Intravenous Q8H  . docusate sodium  100 mg Oral QPM  . famotidine  20 mg Oral Daily  . ferrous sulfate  325 mg Oral QPM  . insulin aspart  0-15 Units Subcutaneous TID WC  . insulin aspart  0-5 Units Subcutaneous QHS  . linagliptin  5 mg Oral Daily  . metoCLOPramide  10 mg Oral TID AC & HS  . pantoprazole  40 mg Oral Daily  . pioglitazone  30 mg Oral QHS  . polyethylene glycol  17 g Oral Daily  . pregabalin  75 mg Oral BID  . senna  2 tablet Oral BID  . simvastatin  60 mg Oral QPM   Continuous Infusions:    Principal Problem:   Chronic osteomyelitis involving lower leg Active Problems:   OBSTRUCTIVE SLEEP APNEA   Ulcer of lower limb, unspecified   Gastroparesis   DM (diabetes mellitus), type 2    Time spent: 25    [facility]  [facility] Pager [phone]. If 7PM-7AM, please contact  night-coverage at [email or web address], password  [facility] 01/11/2014, 11:46 AM  LOS: 4 days

## 2014-01-11 NOTE — Progress Notes (Signed)
Chaplain visited with Samuel [name] while making rounds. Samuel [name] was very cheerful, excited that he is scheduled to be discharged today. Samuel [name] has plenty familial support around him as he transitions to return to his home. After engaging in a very pleasant conversation, Chaplain listened empathetically and exchanged stories. Samuel [name] requested prayer that his knee be well and other intimate matters concerning family. He expressed a high regard for Chaplains and was very much grateful for the visit.  Gala RomneyLarry J Brown

## 2014-01-12 LAB — ANAEROBIC CULTURE

## 2014-02-03 LAB — FUNGUS CULTURE W SMEAR: Fungal Smear: NONE SEEN

## 2014-02-15 ENCOUNTER — Ambulatory Visit (INDEPENDENT_AMBULATORY_CARE_PROVIDER_SITE_OTHER): Payer: Medicare Other | Admitting: Internal Medicine

## 2014-02-15 ENCOUNTER — Encounter: Payer: Self-pay | Admitting: Internal Medicine

## 2014-02-15 VITALS — BP 140/81 | HR 90 | Temp 98.0°F | Wt 307.0 lb

## 2014-02-15 DIAGNOSIS — M86662 Other chronic osteomyelitis, left tibia and fibula: Secondary | ICD-10-CM

## 2014-02-15 DIAGNOSIS — M86669 Other chronic osteomyelitis, unspecified tibia and fibula: Secondary | ICD-10-CM

## 2014-02-15 MED ORDER — CEFAZOLIN SODIUM-DEXTROSE 2-3 GM-% IV SOLR: 2.0000 g | Freq: Three times a day (TID) | INTRAVENOUS | Status: AC

## 2014-02-15 NOTE — Progress Notes (Addendum)
Patient ID: Samuel [name], male   DOB: 04/25/1951, 63 y.o.   MRN: [id number]         [facility] for Infectious Disease  Patient Active Problem List   Diagnosis Date Noted  . DM (diabetes mellitus), type 2 01/07/2014  . Chronic osteomyelitis involving lower leg 01/07/2014  . Vomiting 12/22/2013  . Gastroparesis 12/21/2013  . Intractable nausea and vomiting 12/21/2013  . Coronary artery disease 05/01/2013  . Essential hypertension 05/01/2013  . Hyperlipidemia 05/01/2013  . Generalized weakness 03/20/2013  . Back pain 03/20/2013  . Pulmonary embolism 03/27/2011  . PERFORATION OF GALLBLADDER 08/30/2010  . Ulcer of lower limb, unspecified 03/01/2010  . METHICILLIN SUSCEPTIBLE STAPH AUREUS SEPTICEMIA 01/25/2010  . Acute osteomyelitis, ankle and foot 01/25/2010  . NAUSEA AND VOMITING 01/25/2010  . GERD 11/09/2009  . DIABETES, TYPE 1 05/14/2008  . OBSTRUCTIVE SLEEP APNEA 04/30/2008  . ALLERGIC RHINITIS, SEASONAL 04/30/2008    Patient's Medications  New Prescriptions   No medications on file  Previous Medications   ACETAMINOPHEN (TYLENOL) 500 MG TABLET    Take 500 mg by mouth every 8 (eight) hours as needed for moderate pain.    ASPIRIN 81 MG CHEWABLE TABLET    Chew 81 mg by mouth at bedtime.    CANAGLIFLOZIN (INVOKANA) 100 MG TABS    Take 100 mg by mouth every evening.    DIPHENHYDRAMINE-ACETAMINOPHEN (EQ PAIN RELIEVER PM) 25-500 MG TABS    Take 2 tablets by mouth at bedtime.     DOCUSATE SODIUM (COLACE) 100 MG CAPSULE    Take 100 mg by mouth every evening.    FERROUS SULFATE 325 (65 FE) MG TABLET    Take 325 mg by mouth every evening.    GABAPENTIN (NEURONTIN) 300 MG CAPSULE    Take 300 mg by mouth 3 (three) times daily.   HYDROCODONE-ACETAMINOPHEN (NORCO/VICODIN) 5-325 MG PER TABLET    Take 1-2 tablets by mouth every 6 (six) hours as needed for moderate pain or severe pain.   INSULIN LISPRO (HUMALOG) 100 UNIT/ML INJECTION    Inject 4-7 Units into the skin 3 (three) times  daily before meals. Sliding scale   LANSOPRAZOLE (PREVACID) 30 MG CAPSULE    Take 30 mg by mouth 2 (two) times daily.     METOCLOPRAMIDE (REGLAN) 10 MG TABLET    Take 10 mg by mouth 4 (four) times daily.   NAPROXEN SODIUM (ANAPROX) 220 MG TABLET    Take 220 mg by mouth 2 (two) times daily as needed (pain).    PIOGLITAZONE (ACTOS) 30 MG TABLET    Take 30 mg by mouth at bedtime.    PREGABALIN (LYRICA) 300 MG CAPSULE    Take 300 mg by mouth 3 (three) times daily.    PROMETHAZINE (PHENERGAN) 25 MG SUPPOSITORY    Place 1 suppository (25 mg total) rectally every 6 (six) hours as needed for nausea or vomiting.   PROMETHAZINE (PHENERGAN) 25 MG TABLET    Take 1 tablet (25 mg total) by mouth every 6 (six) hours as needed for nausea.   RANITIDINE (ZANTAC) 150 MG TABLET    Take 150 mg by mouth daily.   SIMVASTATIN (ZOCOR) 40 MG TABLET    Take 60 mg by mouth every evening. Take 1 and 1/2 tablets daily   SITAGLIPTIN (JANUVIA) 100 MG TABLET    Take 100 mg by mouth every evening.   Modified Medications   Modified Medication Previous Medication   CEFAZOLIN (ANCEF) 2-3 GM-% SOLR ceFAZolin (ANCEF)  2-3 GM-% SOLR      Inject 50 mLs (2 g total) into the vein every 8 (eight) hours.    Inject 50 mLs (2 g total) into the vein every 8 (eight) hours.  Discontinued Medications   No medications on file    Subjective: Samuel [name] is in for his routine visit. He is now completed 40 days of IV cefazolin for his chronic left tibial MSSA osteomyelitis. This was started after her removal of all hardware. He states that he is doing much better. His VAC wound dressing was removed last week and he only has a pinpoint hole left with minimal drainage. He has had no problems tolerating his PICC or cefazolin. Review of Systems: Pertinent items are noted in HPI.  Past Medical History  Diagnosis Date  . Hypertension   . CAD (coronary artery disease)   . Gastroparesis   . GERD (gastroesophageal reflux disease)   . Hyperlipidemia     . Peripheral neuropathy   . PONV (postoperative nausea and vomiting)   . Shortness of breath     exertion  . Depression   . Anxiety   . Pulmonary embolism     hx. of 2012  . Peripheral vascular disease   . OSA (obstructive sleep apnea)     "not bad enough for a mask"  . Type II diabetes mellitus   . 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 H/O hiatal hernia   . Arthritis     "all over"   . Charcot's joint     "left foot"    History  Substance Use Topics  . Smoking status: Never Smoker   . Smokeless tobacco: Never Used  . Alcohol Use: Yes     Comment: 01/07/2014 'might have a wine cooler a couple times/yr"    Family History  Problem Relation Age of Onset  . COPD Mother   . Heart disease Mother   . Lung cancer Mother     Allergies  Allergen Reactions  . Codeine     REACTION: GI Upset  . Gabapentin     REACTION: rash/hives (per patient, was a reaction to an inactive ingredient in another mgf brand)   . Nabumetone     REACTION: sick on stomach and rash    Objective: Temp: 98 F (36.7 C) (08/31 1504) Temp src: Oral (08/31 1504) BP: 140/81 mmHg (08/31 1504) Pulse Rate: 90 (08/31 1504)  General: He is in good spirits Skin: Left arm PICC site is normal Left leg: Ace wrap on lower leg  Sed Rate (mm/hr)  Date Value  01/10/2014 47*  01/05/2013 6   12/11/2010 14      CRP (mg/dL)  Date Value  2/95/2841 8.2*  01/05/2013 <0.5   12/11/2010 0.6*      Assessment: He is improving on therapy for chronic osteomyelitis. I will stop his cefazolin and 2 days. I will consider extending therapy with oral cephalexin if his inflammatory markers remain elevated.  Plan: 1. Continue cefazolin 2 more days then have a PICC pulled 2. Repeat sedimentation rate and C-reactive protein   Cliffton Asters, MD [facility] for Infectious Disease [facility] [phone] pager   [phone] cell 02/15/2014, 3:16 PM  Addendum:  Sed Rate (mm/hr)  Date Value   02/15/2014 9   01/10/2014 47*  01/05/2013 6      CRP (mg/dL)  Date Value  4/40/3474 <0.5   01/10/2014 8.2*  01/05/2013 <0.5    Although his  inflammatory markers have returned to normal, I feel it is best to extend his antibiotic therapy with oral cephalexin for 4 more weeks given the chronicity of his infection. He is in agreement with that plan.  Cliffton Asters, MD [facility] for Infectious Disease [facility] [phone] pager   [phone] cell 02/16/2014, 12:15 PM

## 2014-02-16 LAB — C-REACTIVE PROTEIN

## 2014-02-16 LAB — SEDIMENTATION RATE: SED RATE: 9 mm/h (ref 0–16)

## 2014-02-16 MED ORDER — CEPHALEXIN 500 MG PO CAPS: 500.0000 mg | ORAL_CAPSULE | Freq: Four times a day (QID) | ORAL | Status: DC

## 2014-02-16 NOTE — Addendum Note (Signed)
Addended by: Cliffton Asters on: 02/16/2014 12:17 PM   Modules accepted: Orders

## 2014-02-19 LAB — AFB CULTURE WITH SMEAR (NOT AT ARMC): Acid Fast Smear: NONE SEEN

## 2014-04-19 ENCOUNTER — Telehealth: Payer: Self-pay | Admitting: *Deleted

## 2014-04-19 ENCOUNTER — Ambulatory Visit: Payer: Medicare Other | Admitting: Internal Medicine

## 2014-04-19 NOTE — Telephone Encounter (Signed)
Rescheduled appt for 05/03/14 w/ Dr. Orvan Falconerampbell.

## 2014-05-03 ENCOUNTER — Encounter: Payer: Self-pay | Admitting: Internal Medicine

## 2014-05-03 ENCOUNTER — Ambulatory Visit (INDEPENDENT_AMBULATORY_CARE_PROVIDER_SITE_OTHER): Payer: Medicare Other | Admitting: Internal Medicine

## 2014-05-03 VITALS — BP 147/81 | HR 103 | Temp 97.5°F | Wt 300.0 lb

## 2014-05-03 DIAGNOSIS — M86662 Other chronic osteomyelitis, left tibia and fibula: Secondary | ICD-10-CM

## 2014-05-03 NOTE — Progress Notes (Signed)
Patient ID: Samuel [name], male   DOB: 11-21-1950, 63 y.o.   MRN: [id number]         [facility]  Patient Active Problem List   Diagnosis Date Noted  . DM (diabetes mellitus), type 2 01/07/2014  . Chronic osteomyelitis involving lower leg 01/07/2014  . Vomiting 12/22/2013  . Gastroparesis 12/21/2013  . Intractable nausea and vomiting 12/21/2013  . Coronary artery disease 05/01/2013  . Essential hypertension 05/01/2013  . Hyperlipidemia 05/01/2013  . Generalized weakness 03/20/2013  . Back pain 03/20/2013  . Pulmonary embolism 03/27/2011  . PERFORATION OF GALLBLADDER 08/30/2010  . Ulcer of lower limb, unspecified 03/01/2010  . METHICILLIN SUSCEPTIBLE STAPH AUREUS SEPTICEMIA 01/25/2010  . Acute osteomyelitis, ankle and foot 01/25/2010  . NAUSEA AND VOMITING 01/25/2010  . GERD 11/09/2009  . DIABETES, TYPE 1 05/14/2008  . OBSTRUCTIVE SLEEP APNEA 04/30/2008  . ALLERGIC RHINITIS, SEASONAL 04/30/2008    Patient's Medications  New Prescriptions   No medications on file  Previous Medications   ACETAMINOPHEN (TYLENOL) 500 MG TABLET    Take 500 mg by mouth every 8 (eight) hours as needed for moderate pain.    ASPIRIN 81 MG CHEWABLE TABLET    Chew 81 mg by mouth at bedtime.    CANAGLIFLOZIN (INVOKANA) 100 MG TABS    Take 100 mg by mouth every evening.    DIPHENHYDRAMINE-ACETAMINOPHEN (EQ PAIN RELIEVER PM) 25-500 MG TABS    Take 2 tablets by mouth at bedtime.     DOCUSATE SODIUM (COLACE) 100 MG CAPSULE    Take 100 mg by mouth every evening.    FERROUS SULFATE 325 (65 FE) MG TABLET    Take 325 mg by mouth every evening.    GABAPENTIN (NEURONTIN) 300 MG CAPSULE    Take 300 mg by mouth 3 (three) times daily.   HYDROCODONE-ACETAMINOPHEN (NORCO/VICODIN) 5-325 MG PER TABLET    Take 1-2 tablets by mouth every 6 (six) hours as needed for moderate pain or severe pain.   INSULIN LISPRO (HUMALOG) 100 UNIT/ML INJECTION    Inject 4-7 Units into the skin 3 (three) times  daily before meals. Sliding scale   LANSOPRAZOLE (PREVACID) 30 MG CAPSULE    Take 30 mg by mouth 2 (two) times daily.     METOCLOPRAMIDE (REGLAN) 10 MG TABLET    Take 10 mg by mouth 4 (four) times daily.   NAPROXEN SODIUM (ANAPROX) 220 MG TABLET    Take 220 mg by mouth 2 (two) times daily as needed (pain).    PIOGLITAZONE (ACTOS) 30 MG TABLET    Take 30 mg by mouth at bedtime.    PREGABALIN (LYRICA) 300 MG CAPSULE    Take 300 mg by mouth 3 (three) times daily.    PROMETHAZINE (PHENERGAN) 25 MG SUPPOSITORY    Place 1 suppository (25 mg total) rectally every 6 (six) hours as needed for nausea or vomiting.   PROMETHAZINE (PHENERGAN) 25 MG TABLET    Take 1 tablet (25 mg total) by mouth every 6 (six) hours as needed for nausea.   RANITIDINE (ZANTAC) 150 MG TABLET    Take 150 mg by mouth daily.   SIMVASTATIN (ZOCOR) 40 MG TABLET    Take 60 mg by mouth every evening. Take 1 and 1/2 tablets daily   SITAGLIPTIN (JANUVIA) 100 MG TABLET    Take 100 mg by mouth every evening.   Modified Medications   No medications on file  Discontinued Medications   CEPHALEXIN (KEFLEX) 500 MG  CAPSULE    Take 1 capsule (500 mg total) by mouth 4 (four) times daily.    Subjective: [name] is in for his routine follow-up visit. He completed a total of 10 weeks of antibiotics a little over one month ago for his MSSA tibial osteomyelitis. He states that his wound is fully healed and these had no concerns about recurrent infection. Review of Systems: Pertinent items are noted in HPI.  Past Medical History  Diagnosis Date  . Hypertension   . CAD (coronary artery disease)   . Gastroparesis   . GERD (gastroesophageal reflux disease)   . Hyperlipidemia   . Peripheral neuropathy   . PONV (postoperative nausea and vomiting)   . Shortness of breath     exertion  . Depression   . Anxiety   . Pulmonary embolism     hx. of 2012  . Peripheral vascular disease   . OSA (obstructive sleep apnea)     "not bad enough for a  mask"  . Type II diabetes mellitus   . 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 H/O hiatal hernia   . Arthritis     "all over"   . Charcot's joint     "left foot"    History  Substance Use Topics  . Smoking status: Never Smoker   . Smokeless tobacco: Never Used  . Alcohol Use: Yes     Comment: 01/07/2014 'might have a wine cooler a couple times/yr"    Family History  Problem Relation Age of Onset  . COPD Mother   . Heart disease Mother   . Lung cancer Mother     Allergies  Allergen Reactions  . Codeine     REACTION: GI Upset  . Gabapentin     REACTION: rash/hives (per patient, was a reaction to an inactive ingredient in another mgf brand)   . Nabumetone     REACTION: sick on stomach and rash    Objective: Temp: 97.5 F (36.4 C) (11/16 1528) BP: 147/81 mmHg (11/16 1528) Pulse Rate: 103 (11/16 1528)  General: he is in very good spirits Left leg: His incisions are fully healed without any evidence of infection   Assessment: Hopefully his chronic staph aureus osteomyelitis has been cured.  Plan: 1. Continue observation off of antibiotics 2. Follow-up here as needed   Cliffton AstersJohn Arav Bannister, MD [facility] [phone] pager   [phone] cell 05/03/2014, 3:54 PM

## 2014-06-08 ENCOUNTER — Encounter: Payer: Self-pay | Admitting: Internal Medicine

## 2014-08-22 ENCOUNTER — Emergency Department (HOSPITAL_COMMUNITY)
Admission: EM | Admit: 2014-08-22 | Discharge: 2014-08-22 | Disposition: A | Discharge status: Home / Self Care | Payer: Medicare Other | Attending: Emergency Medicine | Admitting: Emergency Medicine

## 2014-08-22 ENCOUNTER — Emergency Department (HOSPITAL_COMMUNITY): Payer: Medicare Other

## 2014-08-22 ENCOUNTER — Encounter (HOSPITAL_COMMUNITY): Payer: Self-pay | Admitting: Emergency Medicine

## 2014-08-22 DIAGNOSIS — E119 Type 2 diabetes mellitus without complications: Secondary | ICD-10-CM | POA: Insufficient documentation

## 2014-08-22 DIAGNOSIS — F329 Major depressive disorder, single episode, unspecified: Secondary | ICD-10-CM | POA: Diagnosis not present

## 2014-08-22 DIAGNOSIS — I2510 Atherosclerotic heart disease of native coronary artery without angina pectoris: Secondary | ICD-10-CM | POA: Diagnosis not present

## 2014-08-22 DIAGNOSIS — L97509 Non-pressure chronic ulcer of other part of unspecified foot with unspecified severity: Secondary | ICD-10-CM

## 2014-08-22 DIAGNOSIS — Z7982 Long term (current) use of aspirin: Secondary | ICD-10-CM | POA: Diagnosis not present

## 2014-08-22 DIAGNOSIS — I10 Essential (primary) hypertension: Secondary | ICD-10-CM | POA: Insufficient documentation

## 2014-08-22 DIAGNOSIS — M1990 Unspecified osteoarthritis, unspecified site: Secondary | ICD-10-CM | POA: Diagnosis not present

## 2014-08-22 DIAGNOSIS — F419 Anxiety disorder, unspecified: Secondary | ICD-10-CM | POA: Diagnosis not present

## 2014-08-22 DIAGNOSIS — L97909 Non-pressure chronic ulcer of unspecified part of unspecified lower leg with unspecified severity: Secondary | ICD-10-CM | POA: Insufficient documentation

## 2014-08-22 DIAGNOSIS — Z9861 Coronary angioplasty status: Secondary | ICD-10-CM | POA: Insufficient documentation

## 2014-08-22 DIAGNOSIS — Z794 Long term (current) use of insulin: Secondary | ICD-10-CM | POA: Insufficient documentation

## 2014-08-22 DIAGNOSIS — E785 Hyperlipidemia, unspecified: Secondary | ICD-10-CM | POA: Diagnosis not present

## 2014-08-22 DIAGNOSIS — G629 Polyneuropathy, unspecified: Secondary | ICD-10-CM | POA: Insufficient documentation

## 2014-08-22 DIAGNOSIS — Z86711 Personal history of pulmonary embolism: Secondary | ICD-10-CM | POA: Diagnosis not present

## 2014-08-22 DIAGNOSIS — K219 Gastro-esophageal reflux disease without esophagitis: Secondary | ICD-10-CM | POA: Diagnosis not present

## 2014-08-22 LAB — CBC WITH DIFFERENTIAL/PLATELET
Basophils Absolute: 0 10*3/uL (ref 0.0–0.1)
Basophils Relative: 1 % (ref 0–1)
EOS ABS: 0.1 10*3/uL (ref 0.0–0.7)
EOS PCT: 3 % (ref 0–5)
HEMATOCRIT: 42.1 % (ref 39.0–52.0)
HEMOGLOBIN: 13.7 g/dL (ref 13.0–17.0)
LYMPHS PCT: 27 % (ref 12–46)
Lymphs Abs: 1.3 10*3/uL (ref 0.7–4.0)
MCH: 31.4 pg (ref 26.0–34.0)
MCHC: 32.5 g/dL (ref 30.0–36.0)
MCV: 96.6 fL (ref 78.0–100.0)
MONO ABS: 0.5 10*3/uL (ref 0.1–1.0)
Monocytes Relative: 10 % (ref 3–12)
Neutro Abs: 2.8 10*3/uL (ref 1.7–7.7)
Neutrophils Relative %: 59 % (ref 43–77)
Platelets: 198 10*3/uL (ref 150–400)
RBC: 4.36 MIL/uL (ref 4.22–5.81)
RDW: 13 % (ref 11.5–15.5)
WBC: 4.7 10*3/uL (ref 4.0–10.5)

## 2014-08-22 LAB — BASIC METABOLIC PANEL
Anion gap: 9 (ref 5–15)
BUN: 16 mg/dL (ref 6–23)
CO2: 25 mmol/L (ref 19–32)
CREATININE: 1.16 mg/dL (ref 0.50–1.35)
Calcium: 9.2 mg/dL (ref 8.4–10.5)
Chloride: 103 mmol/L (ref 96–112)
GFR calc Af Amer: 77 mL/min — ABNORMAL LOW (ref >90)
GFR calc non Af Amer: 66 mL/min — ABNORMAL LOW (ref >90)
Glucose, Bld: 302 mg/dL — ABNORMAL HIGH (ref 70–99)
POTASSIUM: 4.6 mmol/L (ref 3.5–5.1)
SODIUM: 137 mmol/L (ref 135–145)

## 2014-08-22 MED ORDER — CEPHALEXIN 500 MG PO CAPS
500.0000 mg | ORAL_CAPSULE | Freq: Once | ORAL | Status: AC
  Administered 2014-08-22: 500 mg via ORAL
  Filled 2014-08-22: qty 1

## 2014-08-22 MED ORDER — CEFAZOLIN SODIUM 1-5 GM-% IV SOLN
1.0000 g | Freq: Once | INTRAVENOUS | Status: AC
  Administered 2014-08-22: 1 g via INTRAVENOUS
  Filled 2014-08-22: qty 50

## 2014-08-22 MED ORDER — CEPHALEXIN 500 MG PO CAPS: 500.0000 mg | ORAL_CAPSULE | Freq: Four times a day (QID) | ORAL | Status: DC

## 2014-08-22 MED ORDER — SULFAMETHOXAZOLE-TRIMETHOPRIM 800-160 MG PO TABS
2.0000 | ORAL_TABLET | Freq: Once | ORAL | Status: AC
Start: 2014-08-22 — End: 2014-08-22
  Administered 2014-08-22: 2 via ORAL
  Filled 2014-08-22: qty 2

## 2014-08-22 MED ORDER — SULFAMETHOXAZOLE-TRIMETHOPRIM 800-160 MG PO TABS: 2.0000 | ORAL_TABLET | Freq: Two times a day (BID) | ORAL | Status: DC

## 2014-08-22 NOTE — Discharge Instructions (Signed)
Call the [facility] for follow up. Call Dr. Victorino Dike, your orthopedist for recheck. Return to ER with fever, worsening redness, swelling, drainage.   Diabetes and Foot Care Diabetes may cause you to have problems because of poor blood supply (circulation) to your feet and legs. This may cause the skin on your feet to become thinner, break easier, and heal more slowly. Your skin may become dry, and the skin may peel and crack. You may also have nerve damage in your legs and feet causing decreased feeling in them. You may not notice minor injuries to your feet that could lead to infections or more serious problems. Taking care of your feet is one of the most important things you can do for yourself.  HOME CARE INSTRUCTIONS  Wear shoes at all times, even in the house. Do not go barefoot. Bare feet are easily injured.  Check your feet daily for blisters, cuts, and redness. If you cannot see the bottom of your feet, use a mirror or ask someone for help.  Wash your feet with warm water (do not use hot water) and mild soap. Then pat your feet and the areas between your toes until they are completely dry. Do not soak your feet as this can dry your skin.  Apply a moisturizing lotion or petroleum jelly (that does not contain alcohol and is unscented) to the skin on your feet and to dry, brittle toenails. Do not apply lotion between your toes.  Trim your toenails straight across. Do not dig under them or around the cuticle. File the edges of your nails with an emery board or nail file.  Do not cut corns or calluses or try to remove them with medicine.  Wear clean socks or stockings every day. Make sure they are not too tight. Do not wear knee-high stockings since they may decrease blood flow to your legs.  Wear shoes that fit properly and have enough cushioning. To break in new shoes, wear them for just a few hours a day. This prevents you from injuring your feet. Always look in your shoes  before you put them on to be sure there are no objects inside.  Do not cross your legs. This may decrease the blood flow to your feet.  If you find a minor scrape, cut, or break in the skin on your feet, keep it and the skin around it clean and dry. These areas may be cleansed with mild soap and water. Do not cleanse the area with peroxide, alcohol, or iodine.  When you remove an adhesive bandage, be sure not to damage the skin around it.  If you have a wound, look at it several times a day to make sure it is healing.  Do not use heating pads or hot water bottles. They may burn your skin. If you have lost feeling in your feet or legs, you may not know it is happening until it is too late.  Make sure your health care provider performs a complete foot exam at least annually or more often if you have foot problems. Report any cuts, sores, or bruises to your health care provider immediately. SEEK MEDICAL CARE IF:   You have an injury that is not healing.  You have cuts or breaks in the skin.  You have an ingrown nail.  You notice redness on your legs or feet.  You feel burning or tingling in your legs or feet.  You have pain or cramps in your  legs and feet.  Your legs or feet are numb.  Your feet always feel cold. SEEK IMMEDIATE MEDICAL CARE IF:   There is increasing redness, swelling, or pain in or around a wound.  There is a red line that goes up your leg.  Pus is coming from a wound.  You develop a fever or as directed by your health care provider.  You notice a bad smell coming from an ulcer or wound. Document Released: 06/01/2000 Document Revised: 02/04/2013 Document Reviewed: 11/11/2012 [facility] Patient Information 2015 [employer] [place]. This information is not intended to replace advice given to you by your health care provider. Make sure you discuss any questions you have with your health care provider.

## 2014-08-22 NOTE — ED Notes (Signed)
Pt from home c/o  Wound to the bottom of his right great toe that has been there "for a while" small amount of blood noted to have bled through pt's canvas shoe. HX of diabetes and surgical procedure other similar wounds.

## 2014-08-22 NOTE — ED Provider Notes (Signed)
CSN: [id number]     Arrival date & time 08/22/14  1042 History   First MD Initiated Contact with Patient 08/22/14 1049     Chief Complaint  Patient presents with  . foot wound      HPI  Patient presents for evaluation of the wound on his right foot. 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 Has history of left tibial wound requiring extended course/6 weeks IV antibiotics last fall. Noticed "part of the skin coming off" of his right great ray stump today. Small minor bleeding. He presents here.  Past Medical History  Diagnosis Date  . Hypertension   . CAD (coronary artery disease)   . Gastroparesis   . GERD (gastroesophageal reflux disease)   . Hyperlipidemia   . Peripheral neuropathy   . PONV (postoperative nausea and vomiting)   . Shortness of breath     exertion  . Depression   . Anxiety   . Pulmonary embolism     hx. of 2012  . Peripheral vascular disease   . OSA (obstructive sleep apnea)     "not bad enough for a mask"  . Type II diabetes mellitus   . 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 H/O hiatal hernia   . Arthritis     "all over"   . Charcot's joint     "left foot"   Past Surgical History  Procedure Laterality Date  . Foot surgery Left 2010    "for Charcot's joint"  . Vena cava filter placement  2012  . I&d extremity Left "multiple"    leg  . Im nailing tibia Left ~ 2012  . Toe amputation Right ~ 2011    "great toe"  . Shoulder arthroscopy w/ rotator cuff repair Left     "and bone spurs"  . Application of wound vac Left 01/07/2014  . Wound debridement Left 01/07/2014    "tibia"  . Tibial im rod removal Left 01/07/2014  . Cholecystectomy  06/2010  . Coronary angioplasty with stent placement  05/2009    "1"  . Hardware removal Left 01/07/2014    Procedure: LEFT LEG REMOVAL OF DEEP IMPLANT AND SEQUESTRECTOMY; APPLICATION OF WOUND VAC ;  Surgeon: Toni ArthursJohn Hewitt, MD;  Location: [facility] OR;  Service: Orthopedics;   Laterality: Left;  . I&d extremity Left 01/07/2014    Procedure: 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;  Surgeon: Toni ArthursJohn Hewitt, MD;  Location: [facility] OR;  Service: Orthopedics;  Laterality: Left;   Family History  Problem Relation Age of Onset  . COPD Mother   . Heart disease Mother   . Lung cancer Mother    History  Substance Use Topics  . Smoking status: Never Smoker   . Smokeless tobacco: Never Used  . Alcohol Use: Yes     Comment: 01/07/2014 'might have a wine cooler a couple times/yr"    Review of Systems  Constitutional: Negative for fever, chills, diaphoresis, appetite change and fatigue.  HENT: Negative for mouth sores, sore throat and trouble swallowing.   Eyes: Negative for visual disturbance.  Respiratory: Negative for cough, chest tightness, shortness of breath and wheezing.   Cardiovascular: Negative for chest pain.  Gastrointestinal: Negative for nausea, vomiting, abdominal pain, diarrhea and abdominal distention.  Endocrine: Negative for polydipsia, polyphagia and polyuria.  Genitourinary: Negative for dysuria, frequency and hematuria.  Musculoskeletal: Negative for gait problem.  Skin: Negative for color change, pallor and rash.  Avulsion of skin in the area of the right first metatarsal ray at the area of his previous great toe amputation.  Neurological: Negative for dizziness, syncope, light-headedness and headaches.  Hematological: Does not bruise/bleed easily.  Psychiatric/Behavioral: Negative for behavioral problems and confusion.      Allergies  Codeine; Gabapentin; and Nabumetone  Home Medications   Prior to Admission medications   Medication Sig Start Date End Date Taking? Authorizing Provider  aspirin 325 MG tablet Take 325 mg by mouth every 6 (six) hours as needed for mild pain.   Yes Historical Provider, MD  aspirin 81 MG chewable tablet Chew 81 mg by mouth at bedtime.    Yes Historical Provider, MD  Canagliflozin (INVOKANA) 100 MG  TABS Take 100 mg by mouth every evening.    Yes Historical Provider, MD  docusate sodium (COLACE) 100 MG capsule Take 100 mg by mouth every evening.    Yes Historical Provider, MD  ferrous sulfate 325 (65 FE) MG tablet Take 325 mg by mouth every evening.    Yes Historical Provider, MD  gabapentin (NEURONTIN) 300 MG capsule Take 300 mg by mouth 3 (three) times daily.   Yes Historical Provider, MD  insulin detemir (LEVEMIR) 100 UNIT/ML injection Inject 60 Units into the skin 2 (two) times daily.   Yes Historical Provider, MD  insulin lispro (HUMALOG) 100 UNIT/ML injection Inject 4-7 Units into the skin 3 (three) times daily before meals. Sliding scale   Yes Historical Provider, MD  lansoprazole (PREVACID) 30 MG capsule Take 30 mg by mouth 2 (two) times daily.     Yes Historical Provider, MD  metoCLOPramide (REGLAN) 10 MG tablet Take 10 mg by mouth 4 (four) times daily.   Yes Historical Provider, MD  naproxen sodium (ANAPROX) 220 MG tablet Take 220 mg by mouth 2 (two) times daily as needed (pain).    Yes Historical Provider, MD  pioglitazone (ACTOS) 30 MG tablet Take 30 mg by mouth at bedtime.    Yes Historical Provider, MD  promethazine (PHENERGAN) 25 MG tablet Take 25 mg by mouth every 6 (six) hours as needed for nausea or vomiting.   Yes Historical Provider, MD  ranitidine (ZANTAC) 150 MG tablet Take 150 mg by mouth daily.   Yes Historical Provider, MD  simvastatin (ZOCOR) 40 MG tablet Take 60 mg by mouth every evening. Take 1 and 1/2 tablets daily   Yes Historical Provider, MD  sitaGLIPtin (JANUVIA) 100 MG tablet Take 100 mg by mouth every evening.    Yes Historical Provider, MD  cephALEXin (KEFLEX) 500 MG capsule Take 1 capsule (500 mg total) by mouth 4 (four) times daily. 08/22/14   Rolland Porter, MD  HYDROcodone-acetaminophen (NORCO/VICODIN) 5-325 MG per tablet Take 1-2 tablets by mouth every 6 (six) hours as needed for moderate pain or severe pain. 01/11/14   Toni Arthurs, MD  promethazine (PHENERGAN)  25 MG suppository Place 1 suppository (25 mg total) rectally every 6 (six) hours as needed for nausea or vomiting. 04/30/13   Olivia Mackie, MD  promethazine (PHENERGAN) 25 MG tablet Take 1 tablet (25 mg total) by mouth every 6 (six) hours as needed for nausea. Patient not taking: Reported on 08/22/2014 02/24/13   Victorino Dike L Piepenbrink, PA-C  sulfamethoxazole-trimethoprim (SEPTRA DS) 800-160 MG per tablet Take 2 tablets by mouth 2 (two) times daily. 08/22/14   Rolland Porter, MD   BP 145/78 mmHg  Pulse 89  Temp(Src) 97.6 F (36.4 C) (Oral)  Resp 16  SpO2 99% Physical  Exam  Constitutional: He is oriented to person, place, and time. He appears well-developed and well-nourished. No distress.  HENT:  Head: Normocephalic.  Eyes: Conjunctivae are normal. Pupils are equal, round, and reactive to light. No scleral icterus.  Neck: Normal range of motion. Neck supple. No thyromegaly present.  Cardiovascular: Normal rate and regular rhythm.  Exam reveals no gallop and no friction rub.   No murmur heard. Pulmonary/Chest: Effort normal and breath sounds normal. No respiratory distress. He has no wheezes. He has no rales.  Abdominal: Soft. Bowel sounds are normal. He exhibits no distension. There is no tenderness. There is no rebound.  Musculoskeletal: Normal range of motion.       Feet:  Neurological: He is alert and oriented to person, place, and time.  Skin: Skin is warm and dry. No rash noted.  Psychiatric: He has a normal mood and affect. His behavior is normal.    ED Course  Procedures (including critical care time) Labs Review Labs Reviewed  BASIC METABOLIC PANEL - Abnormal; Notable for the following:    Glucose, Bld 302 (*)    GFR calc non Af Amer 66 (*)    GFR calc Af Amer 77 (*)    All other components within normal limits  CBC WITH DIFFERENTIAL/PLATELET    Imaging Review Dg Foot 2 Views Right  08/22/2014   CLINICAL DATA:  Right great toe ulcer for 1 month. Prior amputation 5-6 years ago.  History of diabetes. No recent trauma.  EXAM: RIGHT FOOT - 2 VIEW  COMPARISON:  Right foot MRI 10/18/2008. No prior radiographs available.  FINDINGS: Sequelae of prior first ray amputation are identified at the level of the head of the proximal phalanx. There is diffuse fat reticulation throughout the foot and about the ankle suggestive of edema or cellulitis. There is more focal soft tissue thickening at the amputated great toe. No dissecting soft tissue emphysema is identified. No definite osseous erosion is seen to suggest active osteomyelitis. No acute fracture or dislocation is identified.  IMPRESSION: Diffuse edema/cellulitis and more focal soft tissue thickening at the partially amputated great toe. No radiographic evidence of active osteomyelitis.   Electronically Signed   By: Sebastian Ache   On: 08/22/2014 13:09     EKG Interpretation None      MDM   Final diagnoses:  Foot ulcer   X-rays do not show obvious bony destruction or osteo.  Afebrile. No leukocytosis. I given follow-up information regarding wound care. If symptoms see his orthopedist. Ask him to recheck with drainage, worsening redness, swelling, fever, or other symptoms. Given 2 by mouth Bactrim DS, and 500 by mouth Keflex. Prescriptions for the same.   Rolland Porter, MD 08/25/14 [phone]

## 2014-08-27 ENCOUNTER — Encounter (HOSPITAL_BASED_OUTPATIENT_CLINIC_OR_DEPARTMENT_OTHER): Payer: Medicare Other | Attending: Internal Medicine

## 2014-08-27 DIAGNOSIS — L97511 Non-pressure chronic ulcer of other part of right foot limited to breakdown of skin: Secondary | ICD-10-CM | POA: Diagnosis not present

## 2014-08-27 DIAGNOSIS — E11621 Type 2 diabetes mellitus with foot ulcer: Secondary | ICD-10-CM | POA: Diagnosis present

## 2014-09-01 DIAGNOSIS — E11621 Type 2 diabetes mellitus with foot ulcer: Secondary | ICD-10-CM | POA: Diagnosis not present

## 2014-09-08 DIAGNOSIS — E11621 Type 2 diabetes mellitus with foot ulcer: Secondary | ICD-10-CM | POA: Diagnosis not present

## 2014-09-15 DIAGNOSIS — E11621 Type 2 diabetes mellitus with foot ulcer: Secondary | ICD-10-CM | POA: Diagnosis not present

## 2014-09-22 ENCOUNTER — Encounter (HOSPITAL_BASED_OUTPATIENT_CLINIC_OR_DEPARTMENT_OTHER): Payer: Medicare Other | Attending: Surgery

## 2014-09-22 DIAGNOSIS — E1140 Type 2 diabetes mellitus with diabetic neuropathy, unspecified: Secondary | ICD-10-CM | POA: Insufficient documentation

## 2014-09-22 DIAGNOSIS — E11621 Type 2 diabetes mellitus with foot ulcer: Secondary | ICD-10-CM | POA: Diagnosis not present

## 2014-09-22 DIAGNOSIS — Z89412 Acquired absence of left great toe: Secondary | ICD-10-CM | POA: Insufficient documentation

## 2014-09-22 DIAGNOSIS — L97511 Non-pressure chronic ulcer of other part of right foot limited to breakdown of skin: Secondary | ICD-10-CM | POA: Diagnosis present

## 2014-09-29 DIAGNOSIS — E11621 Type 2 diabetes mellitus with foot ulcer: Secondary | ICD-10-CM | POA: Diagnosis not present

## 2014-10-06 ENCOUNTER — Emergency Department (HOSPITAL_COMMUNITY)
Admission: EM | Admit: 2014-10-06 | Discharge: 2014-10-06 | Disposition: A | Discharge status: Home / Self Care | Payer: Medicare Other | Attending: Emergency Medicine | Admitting: Emergency Medicine

## 2014-10-06 ENCOUNTER — Encounter (HOSPITAL_COMMUNITY): Payer: Self-pay | Admitting: Emergency Medicine

## 2014-10-06 DIAGNOSIS — Z7982 Long term (current) use of aspirin: Secondary | ICD-10-CM | POA: Diagnosis not present

## 2014-10-06 DIAGNOSIS — Z79899 Other long term (current) drug therapy: Secondary | ICD-10-CM | POA: Insufficient documentation

## 2014-10-06 DIAGNOSIS — Z9861 Coronary angioplasty status: Secondary | ICD-10-CM | POA: Insufficient documentation

## 2014-10-06 DIAGNOSIS — G629 Polyneuropathy, unspecified: Secondary | ICD-10-CM | POA: Diagnosis not present

## 2014-10-06 DIAGNOSIS — Z794 Long term (current) use of insulin: Secondary | ICD-10-CM | POA: Insufficient documentation

## 2014-10-06 DIAGNOSIS — I2510 Atherosclerotic heart disease of native coronary artery without angina pectoris: Secondary | ICD-10-CM | POA: Insufficient documentation

## 2014-10-06 DIAGNOSIS — M1990 Unspecified osteoarthritis, unspecified site: Secondary | ICD-10-CM | POA: Insufficient documentation

## 2014-10-06 DIAGNOSIS — E13610 Other specified diabetes mellitus with diabetic neuropathic arthropathy: Secondary | ICD-10-CM | POA: Diagnosis not present

## 2014-10-06 DIAGNOSIS — E119 Type 2 diabetes mellitus without complications: Secondary | ICD-10-CM | POA: Diagnosis not present

## 2014-10-06 DIAGNOSIS — I10 Essential (primary) hypertension: Secondary | ICD-10-CM | POA: Insufficient documentation

## 2014-10-06 DIAGNOSIS — R197 Diarrhea, unspecified: Secondary | ICD-10-CM | POA: Diagnosis not present

## 2014-10-06 DIAGNOSIS — E860 Dehydration: Secondary | ICD-10-CM | POA: Diagnosis not present

## 2014-10-06 DIAGNOSIS — F419 Anxiety disorder, unspecified: Secondary | ICD-10-CM | POA: Diagnosis not present

## 2014-10-06 DIAGNOSIS — Z792 Long term (current) use of antibiotics: Secondary | ICD-10-CM | POA: Diagnosis not present

## 2014-10-06 DIAGNOSIS — K3184 Gastroparesis: Secondary | ICD-10-CM | POA: Diagnosis not present

## 2014-10-06 DIAGNOSIS — F329 Major depressive disorder, single episode, unspecified: Secondary | ICD-10-CM | POA: Insufficient documentation

## 2014-10-06 DIAGNOSIS — R112 Nausea with vomiting, unspecified: Secondary | ICD-10-CM | POA: Diagnosis present

## 2014-10-06 DIAGNOSIS — K219 Gastro-esophageal reflux disease without esophagitis: Secondary | ICD-10-CM | POA: Insufficient documentation

## 2014-10-06 DIAGNOSIS — R1110 Vomiting, unspecified: Secondary | ICD-10-CM | POA: Diagnosis not present

## 2014-10-06 DIAGNOSIS — E785 Hyperlipidemia, unspecified: Secondary | ICD-10-CM | POA: Insufficient documentation

## 2014-10-06 DIAGNOSIS — Z86711 Personal history of pulmonary embolism: Secondary | ICD-10-CM | POA: Diagnosis not present

## 2014-10-06 DIAGNOSIS — Z8669 Personal history of other diseases of the nervous system and sense organs: Secondary | ICD-10-CM | POA: Insufficient documentation

## 2014-10-06 DIAGNOSIS — E669 Obesity, unspecified: Secondary | ICD-10-CM | POA: Insufficient documentation

## 2014-10-06 LAB — CBC WITH DIFFERENTIAL/PLATELET
BASOS ABS: 0 10*3/uL (ref 0.0–0.1)
Basophils Relative: 1 % (ref 0–1)
EOS ABS: 0 10*3/uL (ref 0.0–0.7)
EOS PCT: 0 % (ref 0–5)
HEMATOCRIT: 44.3 % (ref 39.0–52.0)
Hemoglobin: 14.7 g/dL (ref 13.0–17.0)
LYMPHS PCT: 13 % (ref 12–46)
Lymphs Abs: 0.8 10*3/uL (ref 0.7–4.0)
MCH: 31.1 pg (ref 26.0–34.0)
MCHC: 33.2 g/dL (ref 30.0–36.0)
MCV: 93.7 fL (ref 78.0–100.0)
Monocytes Absolute: 0.3 10*3/uL (ref 0.1–1.0)
Monocytes Relative: 4 % (ref 3–12)
NEUTROS ABS: 5.4 10*3/uL (ref 1.7–7.7)
NEUTROS PCT: 82 % — AB (ref 43–77)
Platelets: 250 10*3/uL (ref 150–400)
RBC: 4.73 MIL/uL (ref 4.22–5.81)
RDW: 13.1 % (ref 11.5–15.5)
WBC: 6.6 10*3/uL (ref 4.0–10.5)

## 2014-10-06 LAB — BASIC METABOLIC PANEL
Anion gap: 15 (ref 5–15)
BUN: 24 mg/dL — AB (ref 6–23)
CO2: 16 mmol/L — ABNORMAL LOW (ref 19–32)
Calcium: 9.1 mg/dL (ref 8.4–10.5)
Chloride: 108 mmol/L (ref 96–112)
Creatinine, Ser: 1.26 mg/dL (ref 0.50–1.35)
GFR, EST AFRICAN AMERICAN: 69 mL/min — AB (ref >90)
GFR, EST NON AFRICAN AMERICAN: 60 mL/min — AB (ref >90)
GLUCOSE: 329 mg/dL — AB (ref 70–99)
Potassium: 4.2 mmol/L (ref 3.5–5.1)
Sodium: 139 mmol/L (ref 135–145)

## 2014-10-06 MED ORDER — SODIUM CHLORIDE 0.9 % IV BOLUS (SEPSIS)
1000.0000 mL | INTRAVENOUS | Status: AC
  Administered 2014-10-06: 1000 mL via INTRAVENOUS

## 2014-10-06 MED ORDER — PROMETHAZINE HCL 25 MG/ML IJ SOLN
12.5000 mg | Freq: Once | INTRAMUSCULAR | Status: AC
  Administered 2014-10-06: 12.5 mg via INTRAVENOUS
  Filled 2014-10-06: qty 1

## 2014-10-06 MED ORDER — SODIUM CHLORIDE 0.9 % IV BOLUS (SEPSIS)
1000.0000 mL | Freq: Once | INTRAVENOUS | Status: AC
  Administered 2014-10-06: 1000 mL via INTRAVENOUS

## 2014-10-06 NOTE — ED Notes (Signed)
Pt presents with loud wretching, pt states this is similar to gastroparesis episodes in the past. Pt was seen here for same earlier today, dx at 1 began with wretching again at 1430. Diarrhea x 2 at home.

## 2014-10-06 NOTE — ED Provider Notes (Signed)
CSN: [id number]     Arrival date & time 10/06/14  1103 History   First MD Initiated Contact with Patient 10/06/14 1108     Chief Complaint  Patient presents with  . emesis/diarrhea      (Consider location/radiation/quality/duration/timing/severity/associated sxs/prior Treatment) Patient is a 64 y.o. male presenting with vomiting. The history is provided by the patient.  Emesis Severity:  Moderate Duration:  3 days Timing:  Constant Quality:  Stomach contents Able to tolerate:  Liquids Progression:  Unchanged Chronicity:  Recurrent Recent urination:  Normal Relieved by:  Nothing Worsened by:  Nothing tried Ineffective treatments:  None tried Associated symptoms: diarrhea   Associated symptoms: no abdominal pain and no headaches   Diarrhea:    Quality:  Watery   Severity:  Mild   Duration:  2 days   Timing:  Intermittent   Progression:  Improving   Past Medical History  Diagnosis Date  . Hypertension   . CAD (coronary artery disease)   . Gastroparesis   . GERD (gastroesophageal reflux disease)   . Hyperlipidemia   . Peripheral neuropathy   . PONV (postoperative nausea and vomiting)   . Shortness of breath     exertion  . Depression   . Anxiety   . Pulmonary embolism     hx. of 2012  . Peripheral vascular disease   . OSA (obstructive sleep apnea)     "not bad enough for a mask"  . Type II diabetes mellitus   . 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 H/O hiatal hernia   . Arthritis     "all over"   . Charcot's joint     "left foot"   Past Surgical History  Procedure Laterality Date  . Foot surgery Left 2010    "for Charcot's joint"  . Vena cava filter placement  2012  . I&d extremity Left "multiple"    leg  . Im nailing tibia Left ~ 2012  . Toe amputation Right ~ 2011    "great toe"  . Shoulder arthroscopy w/ rotator cuff repair Left     "and bone spurs"  . Application of wound vac Left 01/07/2014  . Wound debridement Left 01/07/2014     "tibia"  . Tibial im rod removal Left 01/07/2014  . Cholecystectomy  06/2010  . Coronary angioplasty with stent placement  05/2009    "1"  . Hardware removal Left 01/07/2014    Procedure: LEFT LEG REMOVAL OF DEEP IMPLANT AND SEQUESTRECTOMY; APPLICATION OF WOUND VAC ;  Surgeon: Toni Arthurs, MD;  Location: [facility] OR;  Service: Orthopedics;  Laterality: Left;  . 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 COPD Mother   . Heart disease Mother   . Lung cancer Mother    History  Substance Use Topics  . Smoking status: Never Smoker   . Smokeless tobacco: Never Used  . Alcohol Use: Yes     Comment: 01/07/2014 'might have a wine cooler a couple times/yr"    Review of Systems  Constitutional: Negative for fever.  HENT: Negative for drooling and rhinorrhea.   Eyes: Negative for pain.  Respiratory: Negative for cough and shortness of breath.   Cardiovascular: Negative for chest pain and leg swelling.  Gastrointestinal: Positive for nausea, vomiting and diarrhea. Negative for  abdominal pain.  Genitourinary: Negative for dysuria and hematuria.  Musculoskeletal: Negative for gait problem and neck pain.  Skin: Negative for color change.  Neurological: Negative for numbness and headaches.  Hematological: Negative for adenopathy.  Psychiatric/Behavioral: Negative for behavioral problems.  All other systems reviewed and are negative.     Allergies  Codeine; Gabapentin; and Nabumetone  Home Medications   Prior to Admission medications   Medication Sig Start Date End Date Taking? Authorizing Provider  aspirin 325 MG tablet Take 325 mg by mouth every 6 (six) hours as needed for mild pain.    Historical Provider, MD  aspirin 81 MG chewable tablet Chew 81 mg by mouth at bedtime.     Historical Provider, MD   Canagliflozin (INVOKANA) 100 MG TABS Take 100 mg by mouth every evening.     Historical Provider, MD  cephALEXin (KEFLEX) 500 MG capsule Take 1 capsule (500 mg total) by mouth 4 (four) times daily. 08/22/14   Rolland Porter, MD  docusate sodium (COLACE) 100 MG capsule Take 100 mg by mouth every evening.     Historical Provider, MD  ferrous sulfate 325 (65 FE) MG tablet Take 325 mg by mouth every evening.     Historical Provider, MD  gabapentin (NEURONTIN) 300 MG capsule Take 300 mg by mouth 3 (three) times daily.    Historical Provider, MD  HYDROcodone-acetaminophen (NORCO/VICODIN) 5-325 MG per tablet Take 1-2 tablets by mouth every 6 (six) hours as needed for moderate pain or severe pain. 01/11/14   Toni Arthurs, MD  insulin detemir (LEVEMIR) 100 UNIT/ML injection Inject 60 Units into the skin 2 (two) times daily.    Historical Provider, MD  insulin lispro (HUMALOG) 100 UNIT/ML injection Inject 4-7 Units into the skin 3 (three) times daily before meals. Sliding scale    Historical Provider, MD  lansoprazole (PREVACID) 30 MG capsule Take 30 mg by mouth 2 (two) times daily.      Historical Provider, MD  metoCLOPramide (REGLAN) 10 MG tablet Take 10 mg by mouth 4 (four) times daily.    Historical Provider, MD  naproxen sodium (ANAPROX) 220 MG tablet Take 220 mg by mouth 2 (two) times daily as needed (pain).     Historical Provider, MD  pioglitazone (ACTOS) 30 MG tablet Take 30 mg by mouth at bedtime.     Historical Provider, MD  promethazine (PHENERGAN) 25 MG suppository Place 1 suppository (25 mg total) rectally every 6 (six) hours as needed for nausea or vomiting. 04/30/13   Marisa Severin, MD  promethazine (PHENERGAN) 25 MG tablet Take 1 tablet (25 mg total) by mouth every 6 (six) hours as needed for nausea. Patient not taking: Reported on 08/22/2014 02/24/13   Francee Piccolo, PA-C  promethazine (PHENERGAN) 25 MG tablet Take 25 mg by mouth every 6 (six) hours as needed for nausea or vomiting.    Historical  Provider, MD  ranitidine (ZANTAC) 150 MG tablet Take 150 mg by mouth daily.    Historical Provider, MD  simvastatin (ZOCOR) 40 MG tablet Take 60 mg by mouth every evening. Take 1 and 1/2 tablets daily    Historical Provider, MD  sitaGLIPtin (JANUVIA) 100 MG tablet Take 100 mg by mouth every evening.     Historical Provider, MD  sulfamethoxazole-trimethoprim (SEPTRA DS) 800-160 MG per tablet Take 2 tablets by mouth 2 (two) times daily. 08/22/14   Rolland Porter, MD   BP 128/58 mmHg  Pulse 99  Temp(Src) 97.9 F (36.6 C) (Oral)  Resp 16  SpO2 100% Physical Exam  Constitutional: He is oriented to person, place, and time. He appears well-developed and well-nourished.  HENT:  Head: Normocephalic and atraumatic.  Right Ear: External ear normal.  Left Ear: External ear normal.  Nose: Nose normal.  Mouth/Throat: Oropharynx is clear and moist. No oropharyngeal exudate.  Eyes: Conjunctivae and EOM are normal. Pupils are equal, round, and reactive to light.  Neck: Normal range of motion. Neck supple.  Cardiovascular: Normal rate, regular rhythm, normal heart sounds and intact distal pulses.  Exam reveals no gallop and no friction rub.   No murmur heard. Pulmonary/Chest: Effort normal and breath sounds normal. No respiratory distress. He has no wheezes.  Abdominal: Soft. Bowel sounds are normal. He exhibits no distension. There is no tenderness. There is no rebound and no guarding.  Musculoskeletal: Normal range of motion. He exhibits no edema or tenderness.  Right lower extremity slightly larger than left which patient relates to chronic foot wound.  Neurological: He is alert and oriented to person, place, and time.  Skin: Skin is warm and dry.  Psychiatric: He has a normal mood and affect. His behavior is normal.  Nursing note and vitals reviewed.   ED Course  Procedures (including critical care time) Labs Review Labs Reviewed  CBC WITH DIFFERENTIAL/PLATELET - Abnormal; Notable for the  following:    Neutrophils Relative % 82 (*)    All other components within normal limits    Imaging Review No results found.   EKG Interpretation None      MDM   Final diagnoses:  Vomiting and diarrhea    11:51 AM 64 y.o. male w hx of CAD, gastroparesis, PE who presents with nausea, vomiting, and diarrhea for the last 2-3 days. He denies any blood in his stools. He states that he has not had any diarrhea today but has had continued nausea and vomiting. He has a history of gastroparesis and his symptoms are consistent with previous episodes of this. I have seen him in the past for similar symptoms as well. He denies any abdominal pain but does note a soreness to his abdomen from retching. Vital signs unremarkable here. We'll give Phenergan and IV fluids. He declined any pain medicine.  12:47 PM: Patient has had complete symptomatic relief and feels much better and would like to leave. His CBC resulted in his noncontributory. His other lab work messed up in the lab and needs repeating but he would prefer to leave and I think this is reasonable given his significant symptomatic improvement. I have discussed the diagnosis/risks/treatment options with the patient and believe the pt to be eligible for discharge home to follow-up with his pcp. We also discussed returning to the ED immediately if new or worsening sx occur. We discussed the sx which are most concerning (e.g., Intractable vomiting or diarrhea, fever, abdominal pain) that necessitate immediate return. Medications administered to the patient during their visit and any new prescriptions provided to the patient are listed below.  Medications given during this visit Medications  sodium chloride 0.9 % bolus 1,000 mL (1,000 mLs Intravenous New Bag/Given 10/06/14 1146)  promethazine (PHENERGAN) injection 12.5 mg (12.5 mg Intravenous Given 10/06/14 1151)    New Prescriptions   No medications on file      Samuel SheffieldForrest Myrical Andujo, MD 10/06/14  1248

## 2014-10-06 NOTE — ED Notes (Signed)
Patient verbalizes understanding of discharge instructions, home care and follow up care if needed. Patient out of department at this time with brother. 

## 2014-10-06 NOTE — ED Provider Notes (Signed)
CSN: [id number]     Arrival date & time 10/06/14  1908 History   First MD Initiated Contact with Patient 10/06/14 2017     Chief Complaint  Patient presents with  . GI Problem     (Consider location/radiation/quality/duration/timing/severity/associated sxs/prior Treatment) HPI  64 year old male with history of gastroparesis presents with recurrent N/V. Present for past 3 days. Seen earlier in ED today and given IV phenergan with improvement, was discharged home. Recurrent vomiting at home. Having diarrhea as well. No blood. Has not taken his PO nausea meds due to vomiting, and did not take phenergan suppositories because he didn't want to increase diarrhea. States IV phenergan is only thing that helps at this point and reglan doesn't work. No abdominal pain.  Past Medical History  Diagnosis Date  . Hypertension   . CAD (coronary artery disease)   . Gastroparesis   . GERD (gastroesophageal reflux disease)   . Hyperlipidemia   . Peripheral neuropathy   . PONV (postoperative nausea and vomiting)   . Shortness of breath     exertion  . Depression   . Anxiety   . Pulmonary embolism     hx. of 2012  . Peripheral vascular disease   . OSA (obstructive sleep apnea)     "not bad enough for a mask"  . Type II diabetes mellitus   . 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 H/O hiatal hernia   . Arthritis     "all over"   . Charcot's joint     "left foot"   Past Surgical History  Procedure Laterality Date  . Foot surgery Left 2010    "for Charcot's joint"  . Vena cava filter placement  2012  . I&d extremity Left "multiple"    leg  . Im nailing tibia Left ~ 2012  . Toe amputation Right ~ 2011    "great toe"  . Shoulder arthroscopy w/ rotator cuff repair Left     "and bone spurs"  . Application of wound vac Left 01/07/2014  . Wound debridement Left 01/07/2014    "tibia"  . Tibial im rod removal Left 01/07/2014  . Cholecystectomy  06/2010  . Coronary angioplasty with  stent placement  05/2009    "1"  . Hardware removal Left 01/07/2014    Procedure: LEFT LEG REMOVAL OF DEEP IMPLANT AND SEQUESTRECTOMY; APPLICATION OF WOUND VAC ;  Surgeon: Toni Arthurs, MD;  Location: [facility] OR;  Service: Orthopedics;  Laterality: Left;  . 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 COPD Mother   . Heart disease Mother   . Lung cancer Mother    History  Substance Use Topics  . Smoking status: Never Smoker   . Smokeless tobacco: Never Used  . Alcohol Use: Yes     Comment: 01/07/2014 'might have a wine cooler a couple times/yr"    Review of Systems  Constitutional: Negative for fever.  Gastrointestinal: Positive for nausea, vomiting and diarrhea. Negative for abdominal pain.  All other systems reviewed and are negative.     Allergies  Codeine; Gabapentin; and Nabumetone  Home Medications   Prior to Admission medications   Medication Sig Start Date End Date Taking? Authorizing Provider  aspirin 325 MG tablet Take 325 mg by mouth every 6 (six)  hours as needed for mild pain (pain).    Yes Historical Provider, MD  naproxen sodium (ANAPROX) 220 MG tablet Take 440 mg by mouth 2 (two) times daily as needed (pain).    Yes Historical Provider, MD  aspirin 81 MG chewable tablet Chew 81 mg by mouth at bedtime.     Historical Provider, MD  Canagliflozin (INVOKANA) 100 MG TABS Take 100 mg by mouth every evening.     Historical Provider, MD  cephALEXin (KEFLEX) 500 MG capsule Take 1 capsule (500 mg total) by mouth 4 (four) times daily. Patient not taking: Reported on 10/06/2014 08/22/14   Rolland PorterMark James, MD  docusate sodium (COLACE) 100 MG capsule Take 100 mg by mouth every evening.     Historical Provider, MD  ferrous sulfate 325 (65 FE) MG tablet Take 325 mg by mouth every evening.     Historical  Provider, MD  gabapentin (NEURONTIN) 300 MG capsule Take 300 mg by mouth 3 (three) times daily.    Historical Provider, MD  HYDROcodone-acetaminophen (NORCO/VICODIN) 5-325 MG per tablet Take 1-2 tablets by mouth every 6 (six) hours as needed for moderate pain or severe pain. Patient not taking: Reported on 10/06/2014 01/11/14   Toni ArthursJohn Hewitt, MD  insulin detemir (LEVEMIR) 100 UNIT/ML injection Inject 60 Units into the skin 2 (two) times daily.    Historical Provider, MD  insulin lispro (HUMALOG) 100 UNIT/ML injection Inject 4-7 Units into the skin 3 (three) times daily before meals. Sliding scale    Historical Provider, MD  lansoprazole (PREVACID) 30 MG capsule Take 30 mg by mouth 2 (two) times daily.      Historical Provider, MD  metoCLOPramide (REGLAN) 10 MG tablet Take 10 mg by mouth 4 (four) times daily.    Historical Provider, MD  pioglitazone (ACTOS) 30 MG tablet Take 30 mg by mouth at bedtime.     Historical Provider, MD  promethazine (PHENERGAN) 25 MG suppository Place 1 suppository (25 mg total) rectally every 6 (six) hours as needed for nausea or vomiting. Patient not taking: Reported on 10/06/2014 04/30/13   Marisa Severinlga Otter, MD  promethazine (PHENERGAN) 25 MG tablet Take 1 tablet (25 mg total) by mouth every 6 (six) hours as needed for nausea. Patient not taking: Reported on 08/22/2014 02/24/13   Francee PiccoloJennifer Piepenbrink, PA-C  promethazine (PHENERGAN) 25 MG tablet Take 25 mg by mouth every 6 (six) hours as needed for nausea or vomiting.    Historical Provider, MD  ranitidine (ZANTAC) 150 MG tablet Take 150 mg by mouth daily.    Historical Provider, MD  simvastatin (ZOCOR) 40 MG tablet Take 60 mg by mouth every evening. Take 1 and 1/2 tablets daily    Historical Provider, MD  sitaGLIPtin (JANUVIA) 100 MG tablet Take 100 mg by mouth every evening.     Historical Provider, MD  sulfamethoxazole-trimethoprim (SEPTRA DS) 800-160 MG per tablet Take 2 tablets by mouth 2 (two) times daily. Patient not taking:  Reported on 10/06/2014 08/22/14   Rolland PorterMark James, MD   BP 136/79 mmHg  Pulse 108  Temp(Src) 97.7 F (36.5 C) (Oral)  Resp 21  Ht 6\' 3"  (1.905 m)  Wt 305 lb (138.347 kg)  BMI 38.12 kg/m2  SpO2 99% Physical Exam  Constitutional: He is oriented to person, place, and time. He appears well-developed and well-nourished.  obese  HENT:  Head: Normocephalic and atraumatic.  Right Ear: External ear normal.  Left Ear: External ear normal.  Nose: Nose normal.  Eyes: Right eye exhibits no discharge.  Left eye exhibits no discharge.  Neck: Neck supple.  Cardiovascular: Normal rate, regular rhythm, normal heart sounds and intact distal pulses.   Pulmonary/Chest: Effort normal.  Abdominal: Soft. He exhibits no distension. There is no tenderness.  Musculoskeletal: He exhibits no edema.  Neurological: He is alert and oriented to person, place, and time.  Skin: Skin is warm and dry.  Nursing note and vitals reviewed.   ED Course  Procedures (including critical care time) Labs Review Labs Reviewed  BASIC METABOLIC PANEL - Abnormal; Notable for the following:    CO2 16 (*)    Glucose, Bld 329 (*)    BUN 24 (*)    GFR calc non Af Amer 60 (*)    GFR calc Af Amer 69 (*)    All other components within normal limits    Imaging Review No results found.   EKG Interpretation None      MDM   Final diagnoses:  Nausea and vomiting in adult  Dehydration    BMP shows some decreased Bicarb likely from vomiting and dehydration. No AKI. Feels much better after one dose of IV phenergan and wants to leave after 1 liter of fluid. I recommended more IV fluids given clinical dehydration present but he feels better and wants to leave. No abdominal tenderness or bloating to be concerned with obstruction or other pathology that would require imaging.    Pricilla Loveless, MD 10/07/14 [phone]

## 2014-10-06 NOTE — ED Notes (Signed)
Per ems pt from home, co NVD since Monday. HX gastroparesis. Pt denies fever or pain yet  Tender to palpation. Pt is alert and oriented at this time.

## 2014-10-06 NOTE — ED Notes (Signed)
[facility] Expected date:  Expected time:  Means of arrival:  Comments: EMS-nausea

## 2014-10-06 NOTE — Discharge Instructions (Signed)
Dehydration, Adult Dehydration is when you lose more fluids from the body than you take in. Vital organs like the kidneys, brain, and heart cannot function without a proper amount of fluids and salt. Any loss of fluids from the body can cause dehydration.  CAUSES   Vomiting.  Diarrhea.  Excessive sweating.  Excessive urine output.  Fever. SYMPTOMS  Mild dehydration  Thirst.  Dry lips.  Slightly dry mouth. Moderate dehydration  Very dry mouth.  Sunken eyes.  Skin does not bounce back quickly when lightly pinched and released.  Dark urine and decreased urine production.  Decreased tear production.  Headache. Severe dehydration  Very dry mouth.  Extreme thirst.  Rapid, weak pulse (more than 100 beats per minute at rest).  Cold hands and feet.  Not able to sweat in spite of heat and temperature.  Rapid breathing.  Blue lips.  Confusion and lethargy.  Difficulty being awakened.  Minimal urine production.  No tears. DIAGNOSIS  Your caregiver will diagnose dehydration based on your symptoms and your exam. Blood and urine tests will help confirm the diagnosis. The diagnostic evaluation should also identify the cause of dehydration. TREATMENT  Treatment of mild or moderate dehydration can often be done at home by increasing the amount of fluids that you drink. It is best to drink small amounts of fluid more often. Drinking too much at one time can make vomiting worse. Refer to the home care instructions below. Severe dehydration needs to be treated at the hospital where you will probably be given intravenous (IV) fluids that contain water and electrolytes. HOME CARE INSTRUCTIONS   Ask your caregiver about specific rehydration instructions.  Drink enough fluids to keep your urine clear or pale yellow.  Drink small amounts frequently if you have nausea and vomiting.  Eat as you normally do.  Avoid:  Foods or drinks high in sugar.  Carbonated  drinks.  Juice.  Extremely hot or cold fluids.  Drinks with caffeine.  Fatty, greasy foods.  Alcohol.  Tobacco.  Overeating.  Gelatin desserts.  Wash your hands well to avoid spreading bacteria and viruses.  Only take over-the-counter or prescription medicines for pain, discomfort, or fever as directed by your caregiver.  Ask your caregiver if you should continue all prescribed and over-the-counter medicines.  Keep all follow-up appointments with your caregiver. SEEK MEDICAL CARE IF:  You have abdominal pain and it increases or stays in one area (localizes).  You have a rash, stiff neck, or severe headache.  You are irritable, sleepy, or difficult to awaken.  You are weak, dizzy, or extremely thirsty. SEEK IMMEDIATE MEDICAL CARE IF:   You are unable to keep fluids down or you get worse despite treatment.  You have frequent episodes of vomiting or diarrhea.  You have blood or green matter (bile) in your vomit.  You have blood in your stool or your stool looks black and tarry.  You have not urinated in 6 to 8 hours, or you have only urinated a small amount of very dark urine.  You have a fever.  You faint. MAKE SURE YOU:   Understand these instructions.  Will watch your condition.  Will get help right away if you are not doing well or get worse. Document Released: 06/04/2005 Document Revised: 08/27/2011 Document Reviewed: 01/22/2011 Exit[employer] Patient Information 2015 [employer]. This information is not intended to replace advice given to you by your health care provider. Make sure you discuss any questions you have with your health care   provider.  Nausea and Vomiting Nausea is a sick feeling that often comes before throwing up (vomiting). Vomiting is a reflex where stomach contents come out of your mouth. Vomiting can cause severe loss of body fluids (dehydration). Children and elderly adults can become dehydrated quickly, especially if they also have  diarrhea. Nausea and vomiting are symptoms of a condition or disease. It is important to find the cause of your symptoms. CAUSES   Direct irritation of the stomach lining. This irritation can result from increased acid production (gastroesophageal reflux disease), infection, food poisoning, taking certain medicines (such as nonsteroidal anti-inflammatory drugs), alcohol use, or tobacco use.  Signals from the brain.These signals could be caused by a headache, heat exposure, an inner ear disturbance, increased pressure in the brain from injury, infection, a tumor, or a concussion, pain, emotional stimulus, or metabolic problems.  An obstruction in the gastrointestinal tract (bowel obstruction).  Illnesses such as diabetes, hepatitis, gallbladder problems, appendicitis, kidney problems, cancer, sepsis, atypical symptoms of a heart attack, or eating disorders.  Medical treatments such as chemotherapy and radiation.  Receiving medicine that makes you sleep (general anesthetic) during surgery. DIAGNOSIS Your caregiver may ask for tests to be done if the problems do not improve after a few days. Tests may also be done if symptoms are severe or if the reason for the nausea and vomiting is not clear. Tests may include:  Urine tests.  Blood tests.  Stool tests.  Cultures (to look for evidence of infection).  X-rays or other imaging studies. Test results can help your caregiver make decisions about treatment or the need for additional tests. TREATMENT You need to stay well hydrated. Drink frequently but in small amounts.You may wish to drink water, sports drinks, clear broth, or eat frozen ice pops or gelatin dessert to help stay hydrated.When you eat, eating slowly may help prevent nausea.There are also some antinausea medicines that may help prevent nausea. HOME CARE INSTRUCTIONS   Take all medicine as directed by your caregiver.  If you do not have an appetite, do not force yourself to  eat. However, you must continue to drink fluids.  If you have an appetite, eat a normal diet unless your caregiver tells you differently.  Eat a variety of complex carbohydrates (rice, wheat, potatoes, bread), lean meats, yogurt, fruits, and vegetables.  Avoid high-fat foods because they are more difficult to digest.  Drink enough water and fluids to keep your urine clear or pale yellow.  If you are dehydrated, ask your caregiver for specific rehydration instructions. Signs of dehydration may include:  Severe thirst.  Dry lips and mouth.  Dizziness.  Dark urine.  Decreasing urine frequency and amount.  Confusion.  Rapid breathing or pulse. SEEK IMMEDIATE MEDICAL CARE IF:   You have blood or brown flecks (like coffee grounds) in your vomit.  You have black or bloody stools.  You have a severe headache or stiff neck.  You are confused.  You have severe abdominal pain.  You have chest pain or trouble breathing.  You do not urinate at least once every 8 hours.  You develop cold or clammy skin.  You continue to vomit for longer than 24 to 48 hours.  You have a fever. MAKE SURE YOU:   Understand these instructions.  Will watch your condition.  Will get help right away if you are not doing well or get worse. Document Released: 06/04/2005 Document Revised: 08/27/2011 Document Reviewed: 11/01/2010 Exit[employer] Patient Information 2015 [employer]. This information   is not intended to replace advice given to you by your health care provider. Make sure you discuss any questions you have with your health care provider.  

## 2014-10-06 NOTE — ED Notes (Signed)
Patient up to [facility]

## 2014-10-13 DIAGNOSIS — E11621 Type 2 diabetes mellitus with foot ulcer: Secondary | ICD-10-CM | POA: Diagnosis not present

## 2014-10-20 ENCOUNTER — Encounter (HOSPITAL_BASED_OUTPATIENT_CLINIC_OR_DEPARTMENT_OTHER): Payer: Medicare Other | Attending: Surgery

## 2014-10-20 DIAGNOSIS — Z89412 Acquired absence of left great toe: Secondary | ICD-10-CM | POA: Insufficient documentation

## 2014-10-20 DIAGNOSIS — K3184 Gastroparesis: Secondary | ICD-10-CM | POA: Insufficient documentation

## 2014-10-20 DIAGNOSIS — E1140 Type 2 diabetes mellitus with diabetic neuropathy, unspecified: Secondary | ICD-10-CM | POA: Insufficient documentation

## 2014-10-20 DIAGNOSIS — Z794 Long term (current) use of insulin: Secondary | ICD-10-CM | POA: Insufficient documentation

## 2014-10-20 DIAGNOSIS — L97511 Non-pressure chronic ulcer of other part of right foot limited to breakdown of skin: Secondary | ICD-10-CM | POA: Insufficient documentation

## 2014-10-20 DIAGNOSIS — E11621 Type 2 diabetes mellitus with foot ulcer: Secondary | ICD-10-CM | POA: Insufficient documentation

## 2014-10-20 DIAGNOSIS — E1143 Type 2 diabetes mellitus with diabetic autonomic (poly)neuropathy: Secondary | ICD-10-CM | POA: Insufficient documentation

## 2014-10-27 DIAGNOSIS — Z89412 Acquired absence of left great toe: Secondary | ICD-10-CM | POA: Diagnosis not present

## 2014-10-27 DIAGNOSIS — E11621 Type 2 diabetes mellitus with foot ulcer: Secondary | ICD-10-CM | POA: Diagnosis not present

## 2014-10-27 DIAGNOSIS — Z794 Long term (current) use of insulin: Secondary | ICD-10-CM | POA: Diagnosis not present

## 2014-10-27 DIAGNOSIS — E1143 Type 2 diabetes mellitus with diabetic autonomic (poly)neuropathy: Secondary | ICD-10-CM | POA: Diagnosis not present

## 2014-10-27 DIAGNOSIS — E1140 Type 2 diabetes mellitus with diabetic neuropathy, unspecified: Secondary | ICD-10-CM | POA: Diagnosis present

## 2014-10-27 DIAGNOSIS — K3184 Gastroparesis: Secondary | ICD-10-CM | POA: Diagnosis not present

## 2014-10-27 DIAGNOSIS — L97511 Non-pressure chronic ulcer of other part of right foot limited to breakdown of skin: Secondary | ICD-10-CM | POA: Diagnosis present

## 2014-11-03 DIAGNOSIS — E11621 Type 2 diabetes mellitus with foot ulcer: Secondary | ICD-10-CM | POA: Diagnosis not present

## 2014-12-15 ENCOUNTER — Encounter (HOSPITAL_BASED_OUTPATIENT_CLINIC_OR_DEPARTMENT_OTHER): Payer: Medicare Other | Attending: Surgery

## 2014-12-15 DIAGNOSIS — Z8631 Personal history of diabetic foot ulcer: Secondary | ICD-10-CM | POA: Insufficient documentation

## 2014-12-15 DIAGNOSIS — Z89411 Acquired absence of right great toe: Secondary | ICD-10-CM | POA: Insufficient documentation

## 2014-12-15 DIAGNOSIS — K3184 Gastroparesis: Secondary | ICD-10-CM | POA: Insufficient documentation

## 2014-12-15 DIAGNOSIS — E1143 Type 2 diabetes mellitus with diabetic autonomic (poly)neuropathy: Secondary | ICD-10-CM | POA: Insufficient documentation

## 2015-03-07 ENCOUNTER — Inpatient Hospital Stay (HOSPITAL_COMMUNITY): Payer: Medicare Other

## 2015-03-07 ENCOUNTER — Encounter (HOSPITAL_COMMUNITY): Payer: Self-pay | Admitting: Emergency Medicine

## 2015-03-07 ENCOUNTER — Inpatient Hospital Stay (HOSPITAL_COMMUNITY)
Admission: EM | Admit: 2015-03-07 | Discharge: 2015-03-15 | DRG: 853 | Disposition: A | Discharge status: Skilled Nursing Facility | Payer: Medicare Other | Attending: Internal Medicine | Admitting: Internal Medicine

## 2015-03-07 ENCOUNTER — Emergency Department (HOSPITAL_COMMUNITY): Payer: Medicare Other

## 2015-03-07 DIAGNOSIS — Z833 Family history of diabetes mellitus: Secondary | ICD-10-CM | POA: Diagnosis not present

## 2015-03-07 DIAGNOSIS — E785 Hyperlipidemia, unspecified: Secondary | ICD-10-CM | POA: Diagnosis present

## 2015-03-07 DIAGNOSIS — M869 Osteomyelitis, unspecified: Secondary | ICD-10-CM | POA: Insufficient documentation

## 2015-03-07 DIAGNOSIS — E1140 Type 2 diabetes mellitus with diabetic neuropathy, unspecified: Secondary | ICD-10-CM | POA: Diagnosis present

## 2015-03-07 DIAGNOSIS — K5900 Constipation, unspecified: Secondary | ICD-10-CM | POA: Diagnosis present

## 2015-03-07 DIAGNOSIS — Z823 Family history of stroke: Secondary | ICD-10-CM

## 2015-03-07 DIAGNOSIS — E1152 Type 2 diabetes mellitus with diabetic peripheral angiopathy with gangrene: Secondary | ICD-10-CM | POA: Diagnosis present

## 2015-03-07 DIAGNOSIS — Z66 Do not resuscitate: Secondary | ICD-10-CM | POA: Diagnosis present

## 2015-03-07 DIAGNOSIS — Z7982 Long term (current) use of aspirin: Secondary | ICD-10-CM

## 2015-03-07 DIAGNOSIS — Z9049 Acquired absence of other specified parts of digestive tract: Secondary | ICD-10-CM | POA: Diagnosis present

## 2015-03-07 DIAGNOSIS — K3184 Gastroparesis: Secondary | ICD-10-CM | POA: Diagnosis present

## 2015-03-07 DIAGNOSIS — IMO0002 Reserved for concepts with insufficient information to code with codable children: Secondary | ICD-10-CM | POA: Diagnosis present

## 2015-03-07 DIAGNOSIS — D649 Anemia, unspecified: Secondary | ICD-10-CM | POA: Diagnosis present

## 2015-03-07 DIAGNOSIS — E1169 Type 2 diabetes mellitus with other specified complication: Secondary | ICD-10-CM | POA: Diagnosis present

## 2015-03-07 DIAGNOSIS — Z8249 Family history of ischemic heart disease and other diseases of the circulatory system: Secondary | ICD-10-CM

## 2015-03-07 DIAGNOSIS — L03116 Cellulitis of left lower limb: Secondary | ICD-10-CM | POA: Diagnosis present

## 2015-03-07 DIAGNOSIS — E11628 Type 2 diabetes mellitus with other skin complications: Secondary | ICD-10-CM | POA: Diagnosis present

## 2015-03-07 DIAGNOSIS — M86172 Other acute osteomyelitis, left ankle and foot: Secondary | ICD-10-CM | POA: Diagnosis not present

## 2015-03-07 DIAGNOSIS — A419 Sepsis, unspecified organism: Principal | ICD-10-CM | POA: Diagnosis present

## 2015-03-07 DIAGNOSIS — I2510 Atherosclerotic heart disease of native coronary artery without angina pectoris: Secondary | ICD-10-CM | POA: Diagnosis present

## 2015-03-07 DIAGNOSIS — E1165 Type 2 diabetes mellitus with hyperglycemia: Secondary | ICD-10-CM | POA: Diagnosis present

## 2015-03-07 DIAGNOSIS — Z86711 Personal history of pulmonary embolism: Secondary | ICD-10-CM

## 2015-03-07 DIAGNOSIS — G43A1 Cyclical vomiting, intractable: Secondary | ICD-10-CM | POA: Diagnosis not present

## 2015-03-07 DIAGNOSIS — I10 Essential (primary) hypertension: Secondary | ICD-10-CM | POA: Diagnosis present

## 2015-03-07 DIAGNOSIS — E669 Obesity, unspecified: Secondary | ICD-10-CM | POA: Diagnosis present

## 2015-03-07 DIAGNOSIS — Z6835 Body mass index (BMI) 35.0-35.9, adult: Secondary | ICD-10-CM | POA: Diagnosis not present

## 2015-03-07 DIAGNOSIS — E43 Unspecified severe protein-calorie malnutrition: Secondary | ICD-10-CM | POA: Diagnosis present

## 2015-03-07 DIAGNOSIS — R6521 Severe sepsis with septic shock: Secondary | ICD-10-CM | POA: Diagnosis present

## 2015-03-07 DIAGNOSIS — Z794 Long term (current) use of insulin: Secondary | ICD-10-CM

## 2015-03-07 DIAGNOSIS — Z825 Family history of asthma and other chronic lower respiratory diseases: Secondary | ICD-10-CM | POA: Diagnosis not present

## 2015-03-07 DIAGNOSIS — R112 Nausea with vomiting, unspecified: Secondary | ICD-10-CM | POA: Diagnosis present

## 2015-03-07 DIAGNOSIS — R197 Diarrhea, unspecified: Secondary | ICD-10-CM | POA: Diagnosis present

## 2015-03-07 DIAGNOSIS — Z801 Family history of malignant neoplasm of trachea, bronchus and lung: Secondary | ICD-10-CM | POA: Diagnosis not present

## 2015-03-07 DIAGNOSIS — M86179 Other acute osteomyelitis, unspecified ankle and foot: Secondary | ICD-10-CM | POA: Diagnosis present

## 2015-03-07 DIAGNOSIS — N179 Acute kidney failure, unspecified: Secondary | ICD-10-CM | POA: Diagnosis present

## 2015-03-07 DIAGNOSIS — K219 Gastro-esophageal reflux disease without esophagitis: Secondary | ICD-10-CM | POA: Diagnosis present

## 2015-03-07 DIAGNOSIS — A4101 Sepsis due to Methicillin susceptible Staphylococcus aureus: Secondary | ICD-10-CM | POA: Diagnosis not present

## 2015-03-07 DIAGNOSIS — E1143 Type 2 diabetes mellitus with diabetic autonomic (poly)neuropathy: Secondary | ICD-10-CM | POA: Diagnosis present

## 2015-03-07 DIAGNOSIS — B9689 Other specified bacterial agents as the cause of diseases classified elsewhere: Secondary | ICD-10-CM | POA: Diagnosis present

## 2015-03-07 DIAGNOSIS — Z9861 Coronary angioplasty status: Secondary | ICD-10-CM

## 2015-03-07 DIAGNOSIS — I25118 Atherosclerotic heart disease of native coronary artery with other forms of angina pectoris: Secondary | ICD-10-CM | POA: Diagnosis not present

## 2015-03-07 DIAGNOSIS — I25111 Atherosclerotic heart disease of native coronary artery with angina pectoris with documented spasm: Secondary | ICD-10-CM | POA: Diagnosis not present

## 2015-03-07 LAB — CBC WITH DIFFERENTIAL/PLATELET
BASOS ABS: 0 10*3/uL (ref 0.0–0.1)
Basophils Relative: 0 %
Eosinophils Absolute: 0 10*3/uL (ref 0.0–0.7)
Eosinophils Relative: 0 %
HEMATOCRIT: 37.4 % — AB (ref 39.0–52.0)
Hemoglobin: 12.6 g/dL — ABNORMAL LOW (ref 13.0–17.0)
LYMPHS PCT: 22 %
Lymphs Abs: 1.6 10*3/uL (ref 0.7–4.0)
MCH: 30.5 pg (ref 26.0–34.0)
MCHC: 33.7 g/dL (ref 30.0–36.0)
MCV: 90.6 fL (ref 78.0–100.0)
MONO ABS: 0.7 10*3/uL (ref 0.1–1.0)
Monocytes Relative: 9 %
NEUTROS ABS: 5.1 10*3/uL (ref 1.7–7.7)
Neutrophils Relative %: 69 %
Platelets: 274 10*3/uL (ref 150–400)
RBC: 4.13 MIL/uL — AB (ref 4.22–5.81)
RDW: 12.5 % (ref 11.5–15.5)
WBC: 7.4 10*3/uL (ref 4.0–10.5)

## 2015-03-07 LAB — CBG MONITORING, ED: GLUCOSE-CAPILLARY: 291 mg/dL — AB (ref 65–99)

## 2015-03-07 LAB — COMPREHENSIVE METABOLIC PANEL
ALK PHOS: 64 U/L (ref 38–126)
ALT: 13 U/L — AB (ref 17–63)
ALT: 12 U/L — AB (ref 17–63)
AST: 31 U/L (ref 15–41)
AST: 21 U/L (ref 15–41)
Albumin: 2.9 g/dL — ABNORMAL LOW (ref 3.5–5.0)
Albumin: 2.7 g/dL — ABNORMAL LOW (ref 3.5–5.0)
Alkaline Phosphatase: 66 U/L (ref 38–126)
Anion gap: 16 — ABNORMAL HIGH (ref 5–15)
Anion gap: 14 (ref 5–15)
BILIRUBIN TOTAL: 1.3 mg/dL — AB (ref 0.3–1.2)
BUN: 13 mg/dL (ref 6–20)
BUN: 12 mg/dL (ref 6–20)
CALCIUM: 8.3 mg/dL — AB (ref 8.9–10.3)
CHLORIDE: 98 mmol/L — AB (ref 101–111)
CO2: 19 mmol/L — ABNORMAL LOW (ref 22–32)
CO2: 18 mmol/L — ABNORMAL LOW (ref 22–32)
CREATININE: 1.12 mg/dL (ref 0.61–1.24)
CREATININE: 1.07 mg/dL (ref 0.61–1.24)
Calcium: 8.3 mg/dL — ABNORMAL LOW (ref 8.9–10.3)
Chloride: 101 mmol/L (ref 101–111)
GFR calc Af Amer: >60 mL/min (ref >60)
Glucose, Bld: 296 mg/dL — ABNORMAL HIGH (ref 65–99)
Glucose, Bld: 300 mg/dL — ABNORMAL HIGH (ref 65–99)
Potassium: 3.8 mmol/L (ref 3.5–5.1)
Potassium: 3.5 mmol/L (ref 3.5–5.1)
Sodium: 133 mmol/L — ABNORMAL LOW (ref 135–145)
Sodium: 133 mmol/L — ABNORMAL LOW (ref 135–145)
TOTAL PROTEIN: 7.2 g/dL (ref 6.5–8.1)
Total Bilirubin: 1 mg/dL (ref 0.3–1.2)
Total Protein: 7.3 g/dL (ref 6.5–8.1)

## 2015-03-07 LAB — TYPE AND SCREEN
ABO/RH(D): A POS
Antibody Screen: NEGATIVE

## 2015-03-07 LAB — LACTIC ACID, PLASMA: LACTIC ACID, VENOUS: 1.9 mmol/L (ref 0.5–2.0)

## 2015-03-07 LAB — URINALYSIS, ROUTINE W REFLEX MICROSCOPIC
Bilirubin Urine: NEGATIVE
Glucose, UA: >1000 mg/dL — AB
LEUKOCYTES UA: NEGATIVE
NITRITE: NEGATIVE
PROTEIN: 30 mg/dL — AB
Specific Gravity, Urine: 1.03 (ref 1.005–1.030)
UROBILINOGEN UA: 1 mg/dL (ref 0.0–1.0)
pH: 6 (ref 5.0–8.0)

## 2015-03-07 LAB — GLUCOSE, CAPILLARY
GLUCOSE-CAPILLARY: 288 mg/dL — AB (ref 65–99)
GLUCOSE-CAPILLARY: 172 mg/dL — AB (ref 65–99)
Glucose-Capillary: 281 mg/dL — ABNORMAL HIGH (ref 65–99)
Glucose-Capillary: 234 mg/dL — ABNORMAL HIGH (ref 65–99)

## 2015-03-07 LAB — PROCALCITONIN: Procalcitonin: <0.1 ng/mL

## 2015-03-07 LAB — MRSA PCR SCREENING: MRSA BY PCR: NEGATIVE

## 2015-03-07 LAB — CBC
HCT: 37.6 % — ABNORMAL LOW (ref 39.0–52.0)
Hemoglobin: 12.9 g/dL — ABNORMAL LOW (ref 13.0–17.0)
MCH: 30.9 pg (ref 26.0–34.0)
MCHC: 34.3 g/dL (ref 30.0–36.0)
MCV: 90 fL (ref 78.0–100.0)
PLATELETS: 259 10*3/uL (ref 150–400)
RBC: 4.18 MIL/uL — AB (ref 4.22–5.81)
RDW: 12.5 % (ref 11.5–15.5)
WBC: 6.5 10*3/uL (ref 4.0–10.5)

## 2015-03-07 LAB — PROTIME-INR
INR: 1.14 (ref 0.00–1.49)
Prothrombin Time: 14.8 seconds (ref 11.6–15.2)

## 2015-03-07 LAB — ABO/RH: ABO/RH(D): A POS

## 2015-03-07 LAB — URINE MICROSCOPIC-ADD ON

## 2015-03-07 LAB — I-STAT CG4 LACTIC ACID, ED: LACTIC ACID, VENOUS: 3.38 mmol/L — AB (ref 0.5–2.0)

## 2015-03-07 LAB — LIPASE, BLOOD: Lipase: 12 U/L — ABNORMAL LOW (ref 22–51)

## 2015-03-07 LAB — TROPONIN I

## 2015-03-07 LAB — APTT: APTT: 33 s (ref 24–37)

## 2015-03-07 MED ORDER — SODIUM CHLORIDE 0.9 % IV BOLUS (SEPSIS)
1000.0000 mL | Freq: Once | INTRAVENOUS | Status: AC
  Administered 2015-03-07: 1000 mL via INTRAVENOUS

## 2015-03-07 MED ORDER — INSULIN ASPART 100 UNIT/ML ~~LOC~~ SOLN
0.0000 [IU] | SUBCUTANEOUS | Status: DC
  Administered 2015-03-07 (×2): 8 [IU] via SUBCUTANEOUS

## 2015-03-07 MED ORDER — FERROUS SULFATE 325 (65 FE) MG PO TABS
325.0000 mg | ORAL_TABLET | Freq: Every evening | ORAL | Status: DC
  Administered 2015-03-08 – 2015-03-13 (×5): 325 mg via ORAL
  Filled 2015-03-07 (×8): qty 1

## 2015-03-07 MED ORDER — LEVOFLOXACIN IN D5W 750 MG/150ML IV SOLN
750.0000 mg | Freq: Once | INTRAVENOUS | Status: AC
  Administered 2015-03-07: 750 mg via INTRAVENOUS
  Filled 2015-03-07: qty 150

## 2015-03-07 MED ORDER — LINAGLIPTIN 5 MG PO TABS
5.0000 mg | ORAL_TABLET | Freq: Every day | ORAL | Status: DC
  Administered 2015-03-09 – 2015-03-15 (×6): 5 mg via ORAL
  Filled 2015-03-07 (×9): qty 1

## 2015-03-07 MED ORDER — PIOGLITAZONE HCL 30 MG PO TABS
30.0000 mg | ORAL_TABLET | Freq: Every day | ORAL | Status: DC
  Administered 2015-03-09 – 2015-03-14 (×6): 30 mg via ORAL
  Filled 2015-03-07 (×8): qty 1

## 2015-03-07 MED ORDER — SODIUM CHLORIDE 0.9 % IV SOLN
INTRAVENOUS | Status: DC
Start: 2015-03-07 — End: 2015-03-08
  Administered 2015-03-07 (×3): via INTRAVENOUS

## 2015-03-07 MED ORDER — VANCOMYCIN HCL IN DEXTROSE 1-5 GM/200ML-% IV SOLN
1000.0000 mg | Freq: Two times a day (BID) | INTRAVENOUS | Status: DC
  Administered 2015-03-07 – 2015-03-09 (×6): 1000 mg via INTRAVENOUS
  Filled 2015-03-07 (×6): qty 200

## 2015-03-07 MED ORDER — PROMETHAZINE HCL 25 MG/ML IJ SOLN
12.5000 mg | Freq: Four times a day (QID) | INTRAMUSCULAR | Status: DC | PRN
  Administered 2015-03-07 – 2015-03-14 (×16): 12.5 mg via INTRAVENOUS
  Filled 2015-03-07 (×18): qty 1

## 2015-03-07 MED ORDER — SODIUM CHLORIDE 0.9 % IV SOLN: INTRAVENOUS | Status: DC

## 2015-03-07 MED ORDER — ACETAMINOPHEN 325 MG PO TABS
650.0000 mg | ORAL_TABLET | Freq: Once | ORAL | Status: AC
  Administered 2015-03-07: 650 mg via ORAL
  Filled 2015-03-07: qty 2

## 2015-03-07 MED ORDER — METOCLOPRAMIDE HCL 10 MG PO TABS
10.0000 mg | ORAL_TABLET | Freq: Four times a day (QID) | ORAL | Status: DC
  Administered 2015-03-08 – 2015-03-10 (×6): 10 mg via ORAL
  Filled 2015-03-07 (×12): qty 1

## 2015-03-07 MED ORDER — GABAPENTIN 300 MG PO CAPS
300.0000 mg | ORAL_CAPSULE | Freq: Three times a day (TID) | ORAL | Status: DC
  Administered 2015-03-07 – 2015-03-15 (×20): 300 mg via ORAL
  Filled 2015-03-07 (×25): qty 1

## 2015-03-07 MED ORDER — VANCOMYCIN HCL IN DEXTROSE 1-5 GM/200ML-% IV SOLN
1000.0000 mg | Freq: Once | INTRAVENOUS | Status: AC
  Administered 2015-03-07: 1000 mg via INTRAVENOUS

## 2015-03-07 MED ORDER — PROMETHAZINE HCL 25 MG/ML IJ SOLN
25.0000 mg | Freq: Once | INTRAMUSCULAR | Status: AC
  Administered 2015-03-07: 25 mg via INTRAVENOUS
  Filled 2015-03-07: qty 1

## 2015-03-07 MED ORDER — FAMOTIDINE 20 MG PO TABS
20.0000 mg | ORAL_TABLET | Freq: Every day | ORAL | Status: DC
  Administered 2015-03-09 – 2015-03-15 (×6): 20 mg via ORAL
  Filled 2015-03-07 (×7): qty 1

## 2015-03-07 MED ORDER — MORPHINE SULFATE (PF) 2 MG/ML IV SOLN
1.0000 mg | INTRAVENOUS | Status: DC | PRN
  Administered 2015-03-07: 1 mg via INTRAVENOUS
  Filled 2015-03-07: qty 1

## 2015-03-07 MED ORDER — CHLORHEXIDINE GLUCONATE 4 % EX LIQD
60.0000 mL | Freq: Once | CUTANEOUS | Status: AC
  Administered 2015-03-07: 4 via TOPICAL
  Filled 2015-03-07: qty 60

## 2015-03-07 MED ORDER — SODIUM CHLORIDE 0.9 % IV BOLUS (SEPSIS): 1000.0000 mL | Freq: Once | INTRAVENOUS | Status: DC

## 2015-03-07 MED ORDER — ACETAMINOPHEN 650 MG RE SUPP: 650.0000 mg | Freq: Four times a day (QID) | RECTAL | Status: DC | PRN

## 2015-03-07 MED ORDER — ONDANSETRON HCL 4 MG/2ML IJ SOLN: 4.0000 mg | Freq: Four times a day (QID) | INTRAMUSCULAR | Status: DC | PRN

## 2015-03-07 MED ORDER — CLINDAMYCIN PHOSPHATE 900 MG/50ML IV SOLN
900.0000 mg | Freq: Once | INTRAVENOUS | Status: AC
  Administered 2015-03-07: 900 mg via INTRAVENOUS
  Filled 2015-03-07: qty 50

## 2015-03-07 MED ORDER — SIMVASTATIN 40 MG PO TABS
60.0000 mg | ORAL_TABLET | Freq: Every evening | ORAL | Status: DC
  Administered 2015-03-08 – 2015-03-13 (×5): 60 mg via ORAL
  Filled 2015-03-07 (×8): qty 1

## 2015-03-07 MED ORDER — INSULIN ASPART 100 UNIT/ML ~~LOC~~ SOLN
0.0000 [IU] | Freq: Three times a day (TID) | SUBCUTANEOUS | Status: DC
  Administered 2015-03-07: 7 [IU] via SUBCUTANEOUS

## 2015-03-07 MED ORDER — PIPERACILLIN-TAZOBACTAM 3.375 G IVPB
3.3750 g | Freq: Three times a day (TID) | INTRAVENOUS | Status: DC
  Administered 2015-03-07 – 2015-03-09 (×5): 3.375 g via INTRAVENOUS
  Filled 2015-03-07 (×6): qty 50

## 2015-03-07 MED ORDER — INSULIN ASPART 100 UNIT/ML ~~LOC~~ SOLN: 0.0000 [IU] | Freq: Every day | SUBCUTANEOUS | Status: DC

## 2015-03-07 MED ORDER — MORPHINE SULFATE (PF) 4 MG/ML IV SOLN
4.0000 mg | INTRAVENOUS | Status: DC | PRN
  Administered 2015-03-07 – 2015-03-08 (×4): 4 mg via INTRAVENOUS
  Filled 2015-03-07 (×4): qty 1

## 2015-03-07 MED ORDER — ASPIRIN 325 MG PO TABS
325.0000 mg | ORAL_TABLET | Freq: Four times a day (QID) | ORAL | Status: DC | PRN
  Filled 2015-03-07: qty 1

## 2015-03-07 MED ORDER — SODIUM CHLORIDE 0.9 % IV BOLUS (SEPSIS)
1500.0000 mL | Freq: Once | INTRAVENOUS | Status: AC
  Administered 2015-03-07: 1500 mL via INTRAVENOUS

## 2015-03-07 MED ORDER — ONDANSETRON HCL 4 MG PO TABS
4.0000 mg | ORAL_TABLET | Freq: Four times a day (QID) | ORAL | Status: DC | PRN
  Administered 2015-03-13: 4 mg via ORAL
  Filled 2015-03-07 (×2): qty 1

## 2015-03-07 MED ORDER — INSULIN GLARGINE 100 UNIT/ML ~~LOC~~ SOLN
50.0000 [IU] | Freq: Every day | SUBCUTANEOUS | Status: DC
  Administered 2015-03-07 – 2015-03-14 (×8): 50 [IU] via SUBCUTANEOUS
  Filled 2015-03-07 (×9): qty 0.5

## 2015-03-07 MED ORDER — PIPERACILLIN-TAZOBACTAM 3.375 G IVPB 30 MIN
3.3750 g | Freq: Once | INTRAVENOUS | Status: AC
  Administered 2015-03-07: 3.375 g via INTRAVENOUS
  Filled 2015-03-07: qty 50

## 2015-03-07 MED ORDER — ACETAMINOPHEN 325 MG PO TABS: 650.0000 mg | ORAL_TABLET | Freq: Four times a day (QID) | ORAL | Status: DC | PRN

## 2015-03-07 MED ORDER — ONDANSETRON HCL 4 MG/2ML IJ SOLN
4.0000 mg | Freq: Four times a day (QID) | INTRAMUSCULAR | Status: DC | PRN
  Administered 2015-03-07 – 2015-03-15 (×16): 4 mg via INTRAVENOUS
  Filled 2015-03-07 (×17): qty 2

## 2015-03-07 MED ORDER — PANTOPRAZOLE SODIUM 20 MG PO TBEC
20.0000 mg | DELAYED_RELEASE_TABLET | Freq: Every day | ORAL | Status: DC
  Administered 2015-03-09 – 2015-03-15 (×7): 20 mg via ORAL
  Filled 2015-03-07 (×9): qty 1

## 2015-03-07 NOTE — ED Notes (Signed)
Bed: [facility]04 Expected date:  Expected time:  Means of arrival:  Comments: EMS 64 yo male, nausea, vomiting and diarrhea x 2 weeks

## 2015-03-07 NOTE — ED Provider Notes (Signed)
CSN: [id number]     Arrival date & time 03/07/15  4098 History   First MD Initiated Contact with Patient 03/07/15 [phone]     Chief Complaint  Patient presents with  . Wound Infection  . Emesis  . Diarrhea     (Consider location/radiation/quality/duration/timing/severity/associated sxs/prior Treatment) HPI Comments: Patient is a 64 year old male with a past medical history of hypertension, CAD, GERD, and diabetes who presents with 2 week history of NVD. Symptoms started gradually and progressively worsened since the onset. Multiple episodes of watery diarrhea and emesis of stomach contents per day. Patient also complaining of left foot wound that is malodorous. There is throbbing and severe pain at the left foot. No other associated symptoms.    Past Medical History  Diagnosis Date  . Hypertension   . CAD (coronary artery disease)   . Gastroparesis   . GERD (gastroesophageal reflux disease)   . Hyperlipidemia   . Peripheral neuropathy   . PONV (postoperative nausea and vomiting)   . Shortness of breath     exertion  . Depression   . Anxiety   . Pulmonary embolism     hx. of 2012  . Peripheral vascular disease   . OSA (obstructive sleep apnea)     "not bad enough for a mask"  . Type II diabetes mellitus   . 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 H/O hiatal hernia   . Arthritis     "all over"   . Charcot's joint     "left foot"   Past Surgical History  Procedure Laterality Date  . Foot surgery Left 2010    "for Charcot's joint"  . Vena cava filter placement  2012  . I&d extremity Left "multiple"    leg  . Im nailing tibia Left ~ 2012  . Toe amputation Right ~ 2011    "great toe"  . Shoulder arthroscopy w/ rotator cuff repair Left     "and bone spurs"  . Application of wound vac Left 01/07/2014  . Wound debridement Left 01/07/2014    "tibia"  . Tibial im rod removal Left 01/07/2014  . Cholecystectomy  06/2010  . Coronary angioplasty with stent placement   05/2009    "1"  . Hardware removal Left 01/07/2014    Procedure: LEFT LEG REMOVAL OF DEEP IMPLANT AND SEQUESTRECTOMY; APPLICATION OF WOUND VAC ;  Surgeon: Toni Arthurs, MD;  Location: [facility] OR;  Service: Orthopedics;  Laterality: Left;  . 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 COPD Mother   . Heart disease Mother   . Lung cancer Mother   . Retinal detachment Father   . Alcoholism Brother   . Heart disease Brother   . COPD Brother   . Diabetes Brother   . Hypertension Brother   . Stroke Brother    Social History  Substance Use Topics  . Smoking status: Never Smoker   . Smokeless tobacco: Never Used  . Alcohol Use: Yes     Comment: 01/07/2014 'might have a wine cooler a couple times/yr"    Review of Systems  Gastrointestinal: Positive for nausea, vomiting and diarrhea.  Skin: Positive for wound.  All other systems reviewed and are negative.     Allergies  Codeine; Gabapentin; and Nabumetone  Home  Medications   Prior to Admission medications   Medication Sig Start Date End Date Taking? Authorizing Provider  aspirin 325 MG tablet Take 325 mg by mouth every 6 (six) hours as needed for mild pain (pain).     Historical Provider, MD  aspirin 81 MG chewable tablet Chew 81 mg by mouth at bedtime.     Historical Provider, MD  Canagliflozin (INVOKANA) 100 MG TABS Take 100 mg by mouth every evening.     Historical Provider, MD  cephALEXin (KEFLEX) 500 MG capsule Take 1 capsule (500 mg total) by mouth 4 (four) times daily. Patient not taking: Reported on 10/06/2014 08/22/14   Rolland Porter, MD  docusate sodium (COLACE) 100 MG capsule Take 100 mg by mouth every evening.     Historical Provider, MD  ferrous sulfate 325 (65 FE) MG tablet Take 325 mg by mouth every evening.     Historical Provider, MD    gabapentin (NEURONTIN) 300 MG capsule Take 300 mg by mouth 3 (three) times daily.    Historical Provider, MD  HYDROcodone-acetaminophen (NORCO/VICODIN) 5-325 MG per tablet Take 1-2 tablets by mouth every 6 (six) hours as needed for moderate pain or severe pain. Patient not taking: Reported on 10/06/2014 01/11/14   Toni Arthurs, MD  insulin detemir (LEVEMIR) 100 UNIT/ML injection Inject 60 Units into the skin 2 (two) times daily.    Historical Provider, MD  insulin lispro (HUMALOG) 100 UNIT/ML injection Inject 4-7 Units into the skin 3 (three) times daily before meals. Sliding scale    Historical Provider, MD  lansoprazole (PREVACID) 30 MG capsule Take 30 mg by mouth 2 (two) times daily.      Historical Provider, MD  metoCLOPramide (REGLAN) 10 MG tablet Take 10 mg by mouth 4 (four) times daily.    Historical Provider, MD  naproxen sodium (ANAPROX) 220 MG tablet Take 440 mg by mouth 2 (two) times daily as needed (pain).     Historical Provider, MD  pioglitazone (ACTOS) 30 MG tablet Take 30 mg by mouth at bedtime.     Historical Provider, MD  promethazine (PHENERGAN) 25 MG suppository Place 1 suppository (25 mg total) rectally every 6 (six) hours as needed for nausea or vomiting. Patient not taking: Reported on 10/06/2014 04/30/13   Marisa Severin, MD  promethazine (PHENERGAN) 25 MG tablet Take 1 tablet (25 mg total) by mouth every 6 (six) hours as needed for nausea. Patient not taking: Reported on 08/22/2014 02/24/13   Francee Piccolo, PA-C  promethazine (PHENERGAN) 25 MG tablet Take 25 mg by mouth every 6 (six) hours as needed for nausea or vomiting.    Historical Provider, MD  ranitidine (ZANTAC) 150 MG tablet Take 150 mg by mouth daily.    Historical Provider, MD  simvastatin (ZOCOR) 40 MG tablet Take 60 mg by mouth every evening. Take 1 and 1/2 tablets daily    Historical Provider, MD  sitaGLIPtin (JANUVIA) 100 MG tablet Take 100 mg by mouth every evening.     Historical Provider, MD   sulfamethoxazole-trimethoprim (SEPTRA DS) 800-160 MG per tablet Take 2 tablets by mouth 2 (two) times daily. Patient not taking: Reported on 10/06/2014 08/22/14   Rolland Porter, MD   BP 152/64 mmHg  Pulse 102  Temp(Src) 102.6 F (39.2 C) (Rectal)  Resp 17  Ht  (1.905 m)  Wt 307 lb (139.254 kg)  BMI 38.37 kg/m2  SpO2 100% Physical Exam  Constitutional: He is oriented to person, place, and time. He appears well-developed and well-nourished.  No distress.  Patient unkempt  HENT:  Head: Normocephalic and atraumatic.  Eyes: Conjunctivae and EOM are normal.  Neck: Normal range of motion. Neck supple.  Cardiovascular: Regular rhythm.  Exam reveals no gallop and no friction rub.   No murmur heard. tachycardic  Pulmonary/Chest: Effort normal and breath sounds normal. He has no wheezes. He has no rales. He exhibits no tenderness.  Abdominal: Soft. He exhibits no distension. There is no tenderness.  Musculoskeletal: Normal range of motion.  Neurological: He is alert and oriented to person, place, and time. Coordination normal.  Speech is goal-oriented. Moves limbs without ataxia.   Skin: Skin is warm and dry.  Large ulcerative wound of left great toe and left medial foot with unpleasant odor. There is surrounding edema and erythema and tenderness to palpation. Previous amputation of right great toe.   Psychiatric: He has a normal mood and affect. His behavior is normal.  Nursing note and vitals reviewed.          ED Course  Procedures (including critical care time)   CRITICAL CARE Performed by: Emilia Beck   Total critical care time: 35 min  Critical care time was exclusive of separately billable procedures and treating other patients.  Critical care was necessary to treat or prevent imminent or life-threatening deterioration.  Critical care was time spent personally by me on the following activities: development of treatment plan with patient and/or surrogate as well  as nursing, discussions with consultants, evaluation of patient's response to treatment, examination of patient, obtaining history from patient or surrogate, ordering and performing treatments and interventions, ordering and review of laboratory studies, ordering and review of radiographic studies, pulse oximetry and re-evaluation of patient's condition.   Labs Review Labs Reviewed  CBC - Abnormal; Notable for the following:    RBC 4.18 (*)    Hemoglobin 12.9 (*)    HCT 37.6 (*)    All other components within normal limits  CBG MONITORING, ED - Abnormal; Notable for the following:    Glucose-Capillary 291 (*)    All other components within normal limits  I-STAT CG4 LACTIC ACID, ED - Abnormal; Notable for the following:    Lactic Acid, Venous 3.38 (*)    All other components within normal limits  CULTURE, BLOOD (ROUTINE X 2)  CULTURE, BLOOD (ROUTINE X 2)  URINE CULTURE  WOUND CULTURE  LIPASE, BLOOD  COMPREHENSIVE METABOLIC PANEL  URINALYSIS, ROUTINE W REFLEX MICROSCOPIC (NOT AT [facility])    Imaging Review Dg Foot Complete Left  03/07/2015   CLINICAL DATA:  Wound. Obvious wounds and odor to the left foot. History of diabetes.  EXAM: LEFT FOOT - COMPLETE 3+ VIEW  COMPARISON:  12/26/2009  FINDINGS: Since previous study, there has been interval removal of surgical fixation devices. There appear to be a postoperative changes with osteotomies and effusion at the first, second, and possibly third tarsometatarsal joints. Few segments appear intact. No evidence of acute fracture or dislocation. Soft tissue swelling about the first metatarsal-phalangeal joint. There is lucency in the bone at the first metatarsal head, new since prior study. This may indicate evidence of osteomyelitis. No soft tissue gas collections demonstrated. Prominent vascular calcification.  IMPRESSION: Postoperative fusion of the first through third tarsometatarsal joints. Degenerative changes. Soft tissue changes of the first  metatarsal phalangeal joint with apparent lucency in the first metatarsal head suggest probable osteomyelitis.   Electronically Signed   By: Burman Nieves M.D.   On: 03/07/2015 04:28   I have personally  reviewed and evaluated these images and lab results as part of my medical decision-making.   EKG Interpretation None      MDM   Final diagnoses:  Sepsis, due to unspecified organism  Osteomyelitis of foot    4:24 AM Code sepsis initiated. Patient septic from left foot wound with strong malodorous smell. Patient is febrile and tachycardic. Patient will have IV levaquin and clindamycin. Patient to be admitted. Lactic acid 3.38.    Emilia Beck, PA-C 03/07/15 0450  Paula Libra, MD 03/07/15 [phone]

## 2015-03-07 NOTE — Progress Notes (Signed)
ANTIBIOTIC CONSULT NOTE - INITIAL  Pharmacy Consult for vancomycin, Zosyn Indication: rule out sepsis  Allergies  Allergen Reactions  . Codeine     REACTION: GI Upset  . Gabapentin Other (See Comments)    REACTION: rash/hives (per patient, was a reaction to an inactive ingredient in another mgf brand). The patient stated that he does take this medication now and it doesn't cause a rash any more.  . Nabumetone     REACTION: sick on stomach and rash    Patient Measurements: Height:  (190.5 cm) Weight: 278 lb 3.5 oz (126.2 kg) IBW/kg (Calculated) : 84.5  Vital Signs: Temp: 99.3 F (37.4 C) (09/19 0825) Temp Source: Rectal (09/19 0422) BP: 122/62 mmHg (09/19 0800) Pulse Rate: 81 (09/19 0825) Intake/Output from previous day: 09/18 0701 - 09/19 0700 In: 1200 [IV Piggyback:1200] Out: 400 [Urine:400] Intake/Output from this shift:    Labs:  Recent Labs  03/07/15 0411 03/07/15 0810  WBC 6.5 7.4  HGB 12.9* 12.6*  PLT 259 274  CREATININE 1.12  --    Estimated Creatinine Clearance: 97.9 mL/min (by C-G formula based on Cr of 1.12). No results for input(s): VANCOTROUGH, VANCOPEAK, VANCORANDOM, GENTTROUGH, GENTPEAK, GENTRANDOM, TOBRATROUGH, TOBRAPEAK, TOBRARND, AMIKACINPEAK, AMIKACINTROU, AMIKACIN in the last 72 hours.   Microbiology: Recent Results (from the past 720 hour(s))  MRSA PCR Screening     Status: None   Collection Time: 03/07/15  6:28 AM  Result Value Ref Range Status   MRSA by PCR NEGATIVE NEGATIVE Final    Comment:        The GeneXpert MRSA Assay (FDA approved for NASAL specimens only), is one component of a comprehensive MRSA colonization surveillance program. It is not intended to diagnose MRSA infection nor to guide or monitor treatment for MRSA infections.     Medical History: Past Medical History  Diagnosis Date  . Hypertension   . CAD (coronary artery disease)   . Gastroparesis   . GERD (gastroesophageal reflux disease)   .  Hyperlipidemia   . Peripheral neuropathy   . PONV (postoperative nausea and vomiting)   . Shortness of breath     exertion  . Depression   . Anxiety   . Pulmonary embolism     hx. of 2012  . Peripheral vascular disease   . OSA (obstructive sleep apnea)     "not bad enough for a mask"  . Type II diabetes mellitus   . 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 H/O hiatal hernia   . Arthritis     "all over"   . Charcot's joint     "left foot"    Medications:  Anti-infectives    Start     Dose/Rate Route Frequency Ordered Stop   03/07/15 0900  vancomycin (VANCOCIN) IVPB 1000 mg/200 mL premix     1,000 mg 200 mL/hr over 60 Minutes Intravenous  Once 03/07/15 0733     03/07/15 0745  piperacillin-tazobactam (ZOSYN) IVPB 3.375 g     3.375 g 100 mL/hr over 30 Minutes Intravenous  Once 03/07/15 0733     03/07/15 0430  clindamycin (CLEOCIN) IVPB 900 mg     900 mg 100 mL/hr over 30 Minutes Intravenous  Once 03/07/15 0424 03/07/15 0534   03/07/15 0430  levofloxacin (LEVAQUIN) IVPB 750 mg     750 mg 100 mL/hr over 90 Minutes Intravenous  Once 03/07/15 0424 03/07/15 4098     Assessment: 64 y.o. obese male with Hx DMT2, CAD, PE  s/p IVC filter presents 9/19 with recent n/v for several weeks.  Also with increasing discharge from L foot wound; notes sugars have been running high.  Foot XR shows possible osteo L metatarsal.  Lactate elvated; pharmacy to dose vancomycin/Zosyn for sepsis.  9/19 Clinda and levaquin x 1 in ED 9/19 >> Zosyn >> 9/19 >> vancomycin >>    9/19 blood: IP 9/19 urine (cath): IP 9/19 wound Cx: IP  Today, 03/07/2015: Tmax: 102.6 WBC wnl Renal: SCr wnl at baseline; CrCl > 90 CG, 69 N   Goal of Therapy:  Vancomycin trough level 15-20 mcg/ml  Eradication of infection Appropriate antibiotic dosing for indication and renal function  Plan:  Day 1 antibiotics Vancomycin 2000 mg IV now, then 1000 mg IV q12 hr per obesity nomogram Measure vancomycin  trough levels at steady state as indicated Zosyn 3.375 g IV given once over 30 minutes, then every 8 hrs by 4-hr infusion  Follow clinical course, renal function, culture results as available  Follow for de-escalation of antibiotics and LOT   Bernadene Person, PharmD, BCPS Pager: [phone] 03/07/2015, 8:36 AM

## 2015-03-07 NOTE — Progress Notes (Addendum)
PROGRESS NOTE  Samuel [name]:[id number] DOB: 1950-10-30 DOA: 03/07/2015 PCP: [name], MD  HPI/Recap of past 40 hours: 64 year old male with past medical history of obesity and diabetes mellitus as Well as previous osteomyelitis of the left leg admitted on 9/19 for sepsis secondary to large open wound of the left foot with confirmed osteomyelitis under. Patient placed in stepdown unit and started on broad-spectrum antibiotics. Seen by orthopedic surgery were planning for surgery tomorrow with possible midfoot to full foot amputation.  Patient himself complains of severe pain in that foot as well some mild nausea.  Assessment/Plan: Principal Problem:   Sepsis secondary to acute osteomyelitis of left foot: Patient meets criteria given leukocytosis, tachycardia and lactic acid level. IV Zosyn and vancomycin. For operating room tomorrow Active Problems:   Coronary artery disease: Stable   Hyperlipidemia    Diabetes mellitus type 2, uncontrolled with severe chronic secondary gastroparesis: IV schedule Reglan, insulin and sliding scale.     Normocytic normochromic anemia: From chronic disease.   Nausea & vomiting: Secondary to infection plus antibiotics plus gastroparesis. See above.   Obesity (BMI 30-39.9): Patient meets criteria with BMI greater than 30   Code Status: DNR  Family Communication: Left message for family  Disposition Plan: Stable, will transfer out of unit. For surgery tomorrow and then likely short-term skilled nursing later on.   Consultants:  [facility]-orthopedic surgery  Procedures:  Planned partial to full foot amputation  Antibiotics:  IV Zosyn 9/19-present IV vancomycin 9/19-present IV clindamycin 9/191 dose IV Levaquin 9/191 dose  Objective: BP 134/65 mmHg  Pulse 87  Temp(Src) 99 F (37.2 C) (Rectal)  Resp 27  Ht  (1.905 m)  Wt 126.2 kg (278 lb 3.5 oz)  BMI 34.78 kg/m2  SpO2 97%  Intake/Output Summary (Last 24 hours) at 03/07/15  1403 Last data filed at 03/07/15 1200  Gross per 24 hour  Intake   3645 ml  Output    400 ml  Net   3245 ml   Filed Weights   03/07/15 0353 03/07/15 0620  Weight: 139.254 kg (307 lb) 126.2 kg (278 lb 3.5 oz)    Exam:   General:  Alert and oriented 3, moderate distress secondary to pain  Cardiovascular: regular rate and rhythm, S1-S2  Respiratory: clear to auscultation bilaterally  Abdomen: soft, nontender, nondistended, positive bowel sounds  Musculoskeletal: no clubbing or cyanosis, 1+ pitting edema from the knees down. Left foot, lateral aspect notes surrounding erythema and inflammation along with a large 2 cm open wound on the dorsal aspect behind the fourth and fifth toes. Wound is open and draining.  Data Reviewed: Basic Metabolic Panel:  Recent Labs Lab 03/07/15 0411 03/07/15 0810  NA 133* 133*  K 3.8 3.5  CL 98* 101  CO2 19* 18*  GLUCOSE 296* 300*  BUN 13 12  CREATININE 1.12 1.07  CALCIUM 8.3* 8.3*   Liver Function Tests:  Recent Labs Lab 03/07/15 0411 03/07/15 0810  AST 31 21  ALT 13* 12*  ALKPHOS 66 64  BILITOT 1.0 1.3*  PROT 7.3 7.2  ALBUMIN 2.9* 2.7*    Recent Labs Lab 03/07/15 0411  LIPASE 12*   No results for input(s): AMMONIA in the last 168 hours. CBC:  Recent Labs Lab 03/07/15 0411 03/07/15 0810  WBC 6.5 7.4  NEUTROABS  --  5.1  HGB 12.9* 12.6*  HCT 37.6* 37.4*  MCV 90.0 90.6  PLT 259 274   Cardiac Enzymes:    Recent Labs Lab  03/07/15 0810  TROPONINI <0.03   BNP (last 3 results) No results for input(s): BNP in the last 8760 hours.  ProBNP (last 3 results) No results for input(s): PROBNP in the last 8760 hours.  CBG:  Recent Labs Lab 03/07/15 0407 03/07/15 0817 03/07/15 1136  GLUCAP 291* 288* 281*    Recent Results (from the past 240 hour(s))  Wound culture     Status: None (Preliminary result)   Collection Time: 03/07/15  4:46 AM  Result Value Ref Range Status   Specimen Description WOUND LEFT FOOT   Final   Special Requests Normal  Final   Gram Stain   Final    MODERATE WBC PRESENT,BOTH PMN AND MONONUCLEAR NO SQUAMOUS EPITHELIAL CELLS SEEN NO ORGANISMS SEEN Performed at [facility]    Culture PENDING  Incomplete   Report Status PENDING  Incomplete  MRSA PCR Screening     Status: None   Collection Time: 03/07/15  6:28 AM  Result Value Ref Range Status   MRSA by PCR NEGATIVE NEGATIVE Final    Comment:        The GeneXpert MRSA Assay (FDA approved for NASAL specimens only), is one component of a comprehensive MRSA colonization surveillance program. It is not intended to diagnose MRSA infection nor to guide or monitor treatment for MRSA infections.      Studies: Dg Abd Acute W/chest  03/07/2015   CLINICAL DATA:  Two week history nausea, vomiting, and diarrhea.  EXAM: DG ABDOMEN ACUTE W/ 1V CHEST  COMPARISON:  Chest 01/05/2014  FINDINGS: Shallow inspiration with linear atelectasis in the lung bases. Heart size and pulmonary vascularity are normal. No focal airspace disease in the lungs.  Scattered gas and stool in the colon. No small or large bowel distention. No free intra-abdominal air. No abnormal air-fluid levels. Surgical clips in the right upper quadrant. Degenerative changes in the spine. No radiopaque stones.  IMPRESSION: Shallow inspiration with linear atelectasis in the lung bases.  Nonobstructive bowel gas pattern.   Electronically Signed   By: Burman Nieves M.D.   On: 03/07/2015 06:13   Dg Foot Complete Left  03/07/2015   CLINICAL DATA:  Wound. Obvious wounds and odor to the left foot. History of diabetes.  EXAM: LEFT FOOT - COMPLETE 3+ VIEW  COMPARISON:  12/26/2009  FINDINGS: Since previous study, there has been interval removal of surgical fixation devices. There appear to be a postoperative changes with osteotomies and effusion at the first, second, and possibly third tarsometatarsal joints. Few segments appear intact. No evidence of acute fracture or  dislocation. Soft tissue swelling about the first metatarsal-phalangeal joint. There is lucency in the bone at the first metatarsal head, new since prior study. This may indicate evidence of osteomyelitis. No soft tissue gas collections demonstrated. Prominent vascular calcification.  IMPRESSION: Postoperative fusion of the first through third tarsometatarsal joints. Degenerative changes. Soft tissue changes of the first metatarsal phalangeal joint with apparent lucency in the first metatarsal head suggest probable osteomyelitis.   Electronically Signed   By: Burman Nieves M.D.   On: 03/07/2015 04:28    Scheduled Meds: . famotidine  20 mg Oral Daily  . ferrous sulfate  325 mg Oral QPM  . gabapentin  300 mg Oral TID  . insulin aspart  0-15 Units Subcutaneous Q4H  . insulin glargine  50 Units Subcutaneous QHS  . linagliptin  5 mg Oral Daily  . metoCLOPramide  10 mg Oral QID  . pantoprazole  20 mg Oral Daily  .  pioglitazone  30 mg Oral QHS  . piperacillin-tazobactam (ZOSYN)  IV  3.375 g Intravenous 3 times per day  . simvastatin  60 mg Oral QPM  . vancomycin  1,000 mg Intravenous Q12H    Continuous Infusions: . sodium chloride 150 mL/hr at 03/07/15 1610     Time spent: 35 minutes  Hollice Espy  [facility] Pager [phone]. If 7PM-7AM, please contact night-coverage at [email or web address], password [facility] 03/07/2015, 2:03 PM  LOS: 0 days

## 2015-03-07 NOTE — H&P (Addendum)
[facility] History and Physical  AYREN ZUMBRO ZOX:[id number] DOB: [birth year] DOA: 03/07/2015  Referring physician: Ms. Yvonna Alanis. PCP: [name], MD  Specialists: None.  Chief Complaint: Left foot wound and nausea vomiting.  HPI: Samuel [name] is a 64 y.o. male history of diabetes mellitus type 2, CAD status post stenting, hyperlipidemia, previous history of PE status post IVC filter placement presents to the ER because of persistent nausea vomiting or the last few weeks. He also has been having increasing discharge from his left foot wound. In the ER patient was found to be febrile and acute abdominal series was unremarkable. Abdomen appears benign on exam. On exam patient's wound on his left foot has active discharge. Patient states his blood sugar has been persistently running high but he has been compliant with his medications. Over the last 2 days he has missed his insulin dose because of his nausea vomiting which has further worsened. Patient otherwise denies any chest pain or shortness of breath. Denies any productive cough. Has had initially some diarrhea which has stopped at this time. X-rays revealed possible osteomyelitis involving the left foot first metatarsal head.  Review of Systems: As presented in the history of presenting illness, rest negative.  Past Medical History  Diagnosis Date  . Hypertension   . CAD (coronary artery disease)   . Gastroparesis   . GERD (gastroesophageal reflux disease)   . Hyperlipidemia   . Peripheral neuropathy   . PONV (postoperative nausea and vomiting)   . Shortness of breath     exertion  . Depression   . Anxiety   . Pulmonary embolism     hx. of 2012  . Peripheral vascular disease   . OSA (obstructive sleep apnea)     "not bad enough for a mask"  . Type II diabetes mellitus   . 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 H/O hiatal hernia   . Arthritis     "all over"   . Charcot's joint     "left foot"   Past  Surgical History  Procedure Laterality Date  . Foot surgery Left 2010    "for Charcot's joint"  . Vena cava filter placement  2012  . I&d extremity Left "multiple"    leg  . Im nailing tibia Left ~ 2012  . Toe amputation Right ~ 2011    "great toe"  . Shoulder arthroscopy w/ rotator cuff repair Left     "and bone spurs"  . Application of wound vac Left 01/07/2014  . Wound debridement Left 01/07/2014    "tibia"  . Tibial im rod removal Left 01/07/2014  . Cholecystectomy  06/2010  . Coronary angioplasty with stent placement  05/2009    "1"  . Hardware removal Left 01/07/2014    Procedure: LEFT LEG REMOVAL OF DEEP IMPLANT AND SEQUESTRECTOMY; APPLICATION OF WOUND VAC ;  Surgeon: Toni Arthurs, MD;  Location: [facility] OR;  Service: Orthopedics;  Laterality: Left;  . I&d extremity Left 01/07/2014    Procedure: 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;  Surgeon: Toni Arthurs, MD;  Location: [facility] OR;  Service: Orthopedics;  Laterality: Left;   Social History:  reports that he has never smoked. He has never used smokeless tobacco. He reports that he drinks alcohol. He reports that he does not use illicit drugs. Where does patient live home. Can patient participate in ADLs? Yes.  Allergies  Allergen Reactions  . Codeine     REACTION: GI Upset  .  Gabapentin Other (See Comments)    REACTION: rash/hives (per patient, was a reaction to an inactive ingredient in another mgf brand). The patient stated that he does take this medication now and it doesn't cause a rash any more.  . Nabumetone     REACTION: sick on stomach and rash    Family History:  Family History  Problem Relation Age of Onset  . COPD Mother   . Heart disease Mother   . Lung cancer Mother   . Retinal detachment Father   . Alcoholism Brother   . Heart disease Brother   . COPD Brother   . Diabetes Brother   . Hypertension Brother   . Stroke Brother       Prior to Admission medications   Medication Sig Start Date End Date  Taking? Authorizing Provider  aspirin 81 MG chewable tablet Chew 81 mg by mouth at bedtime.    Yes Historical Provider, MD  Canagliflozin (INVOKANA) 100 MG TABS Take 100 mg by mouth every evening.    Yes Historical Provider, MD  docusate sodium (COLACE) 100 MG capsule Take 100 mg by mouth every evening.    Yes Historical Provider, MD  ferrous sulfate 325 (65 FE) MG tablet Take 325 mg by mouth every evening.    Yes Historical Provider, MD  gabapentin (NEURONTIN) 300 MG capsule Take 300 mg by mouth 3 (three) times daily.   Yes Historical Provider, MD  insulin detemir (LEVEMIR) 100 UNIT/ML injection Inject 65 Units into the skin 2 (two) times daily.    Yes Historical Provider, MD  insulin lispro (HUMALOG) 100 UNIT/ML injection Inject 4-7 Units into the skin 3 (three) times daily before meals. Sliding scale   Yes Historical Provider, MD  lansoprazole (PREVACID) 30 MG capsule Take 30 mg by mouth 2 (two) times daily.     Yes Historical Provider, MD  metoCLOPramide (REGLAN) 10 MG tablet Take 10 mg by mouth 4 (four) times daily.   Yes Historical Provider, MD  naproxen sodium (ANAPROX) 220 MG tablet Take 440 mg by mouth 2 (two) times daily as needed (pain).    Yes Historical Provider, MD  pioglitazone (ACTOS) 30 MG tablet Take 30 mg by mouth at bedtime.    Yes Historical Provider, MD  ranitidine (ZANTAC) 150 MG tablet Take 150 mg by mouth daily as needed for heartburn.    Yes Historical Provider, MD  simvastatin (ZOCOR) 40 MG tablet Take 60 mg by mouth every evening. Take 1 and 1/2 tablets daily   Yes Historical Provider, MD  sitaGLIPtin (JANUVIA) 100 MG tablet Take 100 mg by mouth every evening.    Yes Historical Provider, MD  aspirin 325 MG tablet Take 325 mg by mouth every 6 (six) hours as needed for mild pain (pain).     Historical Provider, MD  cephALEXin (KEFLEX) 500 MG capsule Take 1 capsule (500 mg total) by mouth 4 (four) times daily. Patient not taking: Reported on 10/06/2014 08/22/14   Rolland Porter, MD   HYDROcodone-acetaminophen (NORCO/VICODIN) 5-325 MG per tablet Take 1-2 tablets by mouth every 6 (six) hours as needed for moderate pain or severe pain. Patient not taking: Reported on 10/06/2014 01/11/14   Toni Arthurs, MD  promethazine (PHENERGAN) 25 MG suppository Place 1 suppository (25 mg total) rectally every 6 (six) hours as needed for nausea or vomiting. Patient not taking: Reported on 10/06/2014 04/30/13   Marisa Severin, MD  promethazine (PHENERGAN) 25 MG tablet Take 1 tablet (25 mg total) by mouth every 6 (  six) hours as needed for nausea. Patient not taking: Reported on 08/22/2014 02/24/13   Francee Piccolo, PA-C  promethazine (PHENERGAN) 25 MG tablet Take 25 mg by mouth every 6 (six) hours as needed for nausea or vomiting.    Historical Provider, MD  sulfamethoxazole-trimethoprim (SEPTRA DS) 800-160 MG per tablet Take 2 tablets by mouth 2 (two) times daily. Patient not taking: Reported on 10/06/2014 08/22/14   Rolland Porter, MD    Physical Exam: Filed Vitals:   03/07/15 0437 03/07/15 0500 03/07/15 0620 03/07/15 0633  BP: 157/60 155/55 171/81   Pulse: 71 93 81 81  Temp: 100.8 F (38.2 C) 101.7 F (38.7 C)    TempSrc:      Resp:  Height:    (1.905 m)   Weight:   126.2 kg (278 lb 3.5 oz)   SpO2: 93% 97% 99% 99%     General:  Obese not in distress.  Eyes: Anicteric no pallor.  ENT: No discharge from the ears eyes nose and mouth.  Neck: No mass felt.  Cardiovascular: S1-S2 heard.  Respiratory: No rhonchi or crepitations.  Abdomen: Soft nontender bowel sounds present.  Skin: Left foot has an ulcer on the medial aspect with discharge. The ulcer measures around 1 cm. Left foot also is erythematous.  Musculoskeletal: Left foot as explained in the skin.  Psychiatric: Appears normal.  Neurologic: Alert awake oriented to time place and person. Moves all extremities.  Labs on Admission:  Basic Metabolic Panel:  Recent Labs Lab 03/07/15 0411  NA 133*  K 3.8   CL 98*  CO2 19*  GLUCOSE 296*  BUN 13  CREATININE 1.12  CALCIUM 8.3*   Liver Function Tests:  Recent Labs Lab 03/07/15 0411  AST 31  ALT 13*  ALKPHOS 66  BILITOT 1.0  PROT 7.3  ALBUMIN 2.9*    Recent Labs Lab 03/07/15 0411  LIPASE 12*   No results for input(s): AMMONIA in the last 168 hours. CBC:  Recent Labs Lab 03/07/15 0411  WBC 6.5  HGB 12.9*  HCT 37.6*  MCV 90.0  PLT 259   Cardiac Enzymes: No results for input(s): CKTOTAL, CKMB, CKMBINDEX, TROPONINI in the last 168 hours.  BNP (last 3 results) No results for input(s): BNP in the last 8760 hours.  ProBNP (last 3 results) No results for input(s): PROBNP in the last 8760 hours.  CBG:  Recent Labs Lab 03/07/15 0407  GLUCAP 291*    Radiological Exams on Admission: Dg Abd Acute W/chest  03/07/2015   CLINICAL DATA:  Two week history nausea, vomiting, and diarrhea.  EXAM: DG ABDOMEN ACUTE W/ 1V CHEST  COMPARISON:  Chest 01/05/2014  FINDINGS: Shallow inspiration with linear atelectasis in the lung bases. Heart size and pulmonary vascularity are normal. No focal airspace disease in the lungs.  Scattered gas and stool in the colon. No small or large bowel distention. No free intra-abdominal air. No abnormal air-fluid levels. Surgical clips in the right upper quadrant. Degenerative changes in the spine. No radiopaque stones.  IMPRESSION: Shallow inspiration with linear atelectasis in the lung bases.  Nonobstructive bowel gas pattern.   Electronically Signed   By: Burman Nieves M.D.   On: 03/07/2015 06:13   Dg Foot Complete Left  03/07/2015   CLINICAL DATA:  Wound. Obvious wounds and odor to the left foot. History of diabetes.  EXAM: LEFT FOOT - COMPLETE 3+ VIEW  COMPARISON:  12/26/2009  FINDINGS: Since previous study, there has been interval  removal of surgical fixation devices. There appear to be a postoperative changes with osteotomies and effusion at the first, second, and possibly third tarsometatarsal  joints. Few segments appear intact. No evidence of acute fracture or dislocation. Soft tissue swelling about the first metatarsal-phalangeal joint. There is lucency in the bone at the first metatarsal head, new since prior study. This may indicate evidence of osteomyelitis. No soft tissue gas collections demonstrated. Prominent vascular calcification.  IMPRESSION: Postoperative fusion of the first through third tarsometatarsal joints. Degenerative changes. Soft tissue changes of the first metatarsal phalangeal joint with apparent lucency in the first metatarsal head suggest probable osteomyelitis.   Electronically Signed   By: Burman Nieves M.D.   On: 03/07/2015 04:28    EKG: Independently reviewed. Normal sinus rhythm.  Assessment/Plan Principal Problem:   Sepsis Active Problems:   Coronary artery disease   Hyperlipidemia   Osteomyelitis of foot, acute   Diabetes mellitus type 2, uncontrolled   Normocytic normochromic anemia   Nausea & vomiting   Blood poisoning   1. Sepsis from osteomyelitis and cellulitis of the left foot - patient has been placed on sepsis protocol and fluid bolus has been ordered. Continue with hydration and empiric antibiotics including vancomycin and Zosyn for now. Consult patient's orthopedic surgeon Dr. Victorino Dike. 2. Nausea and vomiting - suspect most likely from gastroparesis. Acute abdominal series was unremarkable. If patient has diarrhea will need stool studies. If the nausea vomiting persist will need further imaging studies. As Patient nothing by mouth except medications. 3. Diabetes mellitus type 2 uncontrolled - patient does have mild anion gap. I am ordering another repeat metabolic panel and if anion gap persists then patient will need to be on IV insulin infusion for DKA. For now I have placed patient on Lantus insulin 50 units subcutaneous every 12 hours. Closely follow CBGs. 4. CAD status post stenting - denies any chest pain. Patient is on statins and  aspirin. 5. Normocytic normochromic anemia - closely follow CBC. 6. Hyperlipidemia on statins. 7. History of PE status post IVC filter placement.  I have reviewed patient's old charts and labs. Personally reviewed EKG and chest x-ray.   DVT Prophylaxis SCDs for now in anticipation of possible procedure for left foot.  Code Status: DO NOT RESUSCITATE.  Family Communication: Discussed with patient.  Disposition Plan: Admit to inpatient.    Mackensie Pilson N. [facility] Pager [phone].  If 7PM-7AM, please contact night-coverage [email or web address] Password [email or web address] 03/07/2015, 7:34 AM

## 2015-03-07 NOTE — ED Notes (Signed)
Patient transported to X-ray 

## 2015-03-07 NOTE — ED Notes (Signed)
Per EMS- N/V/D x2 weeks without abdominal pain. VS: BP 132/79 CBG 306 mg/dl HR 098 Regular. 12 lead unremarkable SpO2 98% on RA. 22 G on left wrist with 8 mg Zofran.

## 2015-03-07 NOTE — ED Notes (Signed)
Writer notified EDP of abnormal lactic result 

## 2015-03-07 NOTE — Care Management Note (Signed)
Case Management Note  Patient Details  Name: ZEKI BEDROSIAN MRN: [id number] Date of Birth: 30-Apr-1951  Subjective/Objective:           Sepsis with osteomyelitis of the foot         Action/Plan:Date:  Sept. 19, 2016 U.R. performed for needs and level of care. Will continue to follow for Case Management needs.  Marcelle Smiling, RN, BSN, [place]   [phone]   Expected Discharge Date:                  Expected Discharge Plan:  Home/Self Care  In-House Referral:     Discharge planning Services  CM Consult  Post Acute Care Choice:  NA Choice offered to:  NA  DME Arranged:    DME Agency:     HH Arranged:    HH Agency:     Status of Service:  In process, will continue to follow  Medicare Important Message Given:    Date Medicare IM Given:    Medicare IM give by:    Date Additional Medicare IM Given:    Additional Medicare Important Message give by:     If discussed at Long Length of Stay Meetings, dates discussed:    Additional Comments:  Golda Acre, RN 03/07/2015, 10:24 AM

## 2015-03-07 NOTE — Consult Note (Signed)
Reason for Consult:  Left foot diabetic wound Referring Physician:  Dr. Berenice Bouton is an 64 y.o. male.  HPI: 64 y/o male with pmh of diabetes presents today with left foot pain and a non healing ulcer.  He says its been there for several weeks.  He c/o intractable nausea.  In the ER he meets criteria for sepsis and is admitted now to the ICU.  He c/o pain in the foot that is severe at times.  He has less pain when he's off of the foot.  Past Medical History  Diagnosis Date  . Hypertension   . CAD (coronary artery disease)   . Gastroparesis   . GERD (gastroesophageal reflux disease)   . Hyperlipidemia   . Peripheral neuropathy   . PONV (postoperative nausea and vomiting)   . Shortness of breath     exertion  . Depression   . Anxiety   . Pulmonary embolism     hx. of 2012  . Peripheral vascular disease   . OSA (obstructive sleep apnea)     "not bad enough for a mask"  . Type II diabetes mellitus   . 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 H/O hiatal hernia   . Arthritis     "all over"   . Charcot's joint     "left foot"    Past Surgical History  Procedure Laterality Date  . Foot surgery Left 2010    "for Charcot's joint"  . Vena cava filter placement  2012  . I&d extremity Left "multiple"    leg  . Im nailing tibia Left ~ 2012  . Toe amputation Right ~ 2011    "great toe"  . Shoulder arthroscopy w/ rotator cuff repair Left     "and bone spurs"  . Application of wound vac Left 01/07/2014  . Wound debridement Left 01/07/2014    "tibia"  . Tibial im rod removal Left 01/07/2014  . Cholecystectomy  06/2010  . Coronary angioplasty with stent placement  05/2009    "1"  . Hardware removal Left 01/07/2014    Procedure: LEFT LEG REMOVAL OF DEEP IMPLANT AND SEQUESTRECTOMY; APPLICATION OF WOUND VAC ;  Surgeon: Wylene Simmer, MD;  Location: [facility];  Service: Orthopedics;  Laterality: Left;  . I&d extremity Left 01/07/2014    Procedure: IRRIGATION AND  DEBRIDEMENT OF CHRONIC TIBIAL ULCER;  Surgeon: Wylene Simmer, MD;  Location: [facility];  Service: Orthopedics;  Laterality: Left;    Family History  Problem Relation Age of Onset  . COPD Mother   . Heart disease Mother   . Lung cancer Mother   . Retinal detachment Father   . Alcoholism Brother   . Heart disease Brother   . COPD Brother   . Diabetes Brother   . Hypertension Brother   . Stroke Brother     Social History:  reports that he has never smoked. He has never used smokeless tobacco. He reports that he drinks alcohol. He reports that he does not use illicit drugs.  Allergies:  Allergies  Allergen Reactions  . Codeine     REACTION: GI Upset  . Gabapentin Other (See Comments)    REACTION: rash/hives (per patient, was a reaction to an inactive ingredient in another mgf brand). The patient stated that he does take this medication now and it doesn't cause a rash any more.  . Nabumetone     REACTION: sick on stomach and rash    Medications:  I have reviewed the patient's current medications.  Results for orders placed or performed during the hospital encounter of 03/07/15 (from the past 48 hour(s))  CBG monitoring, ED     Status: Abnormal   Collection Time: 03/07/15  4:07 AM  Result Value Ref Range   Glucose-Capillary 291 (H) 65 - 99 mg/dL   Comment 1 Notify RN   Lipase, blood     Status: Abnormal   Collection Time: 03/07/15  4:11 AM  Result Value Ref Range   Lipase 12 (L) 22 - 51 U/L  Comprehensive metabolic panel     Status: Abnormal   Collection Time: 03/07/15  4:11 AM  Result Value Ref Range   Sodium 133 (L) 135 - 145 mmol/L   Potassium 3.8 3.5 - 5.1 mmol/L   Chloride 98 (L) 101 - 111 mmol/L   CO2 19 (L) 22 - 32 mmol/L   Glucose, Bld 296 (H) 65 - 99 mg/dL   BUN 13 6 - 20 mg/dL   Creatinine, Ser 1.12 0.61 - 1.24 mg/dL   Calcium 8.3 (L) 8.9 - 10.3 mg/dL   Total Protein 7.3 6.5 - 8.1 g/dL   Albumin 2.9 (L) 3.5 - 5.0 g/dL   AST 31 15 - 41 U/L   ALT 13 (L) 17 - 63 U/L    Alkaline Phosphatase 66 38 - 126 U/L   Total Bilirubin 1.0 0.3 - 1.2 mg/dL   GFR calc non Af Amer >60 >60 mL/min   GFR calc Af Amer >60 >60 mL/min    Comment: (NOTE) The eGFR has been calculated using the CKD EPI equation. This calculation has not been validated in all clinical situations. eGFR's persistently <60 mL/min signify possible Chronic Kidney Disease.    Anion gap 16 (H) 5 - 15  CBC     Status: Abnormal   Collection Time: 03/07/15  4:11 AM  Result Value Ref Range   WBC 6.5 4.0 - 10.5 K/uL   RBC 4.18 (L) 4.22 - 5.81 MIL/uL   Hemoglobin 12.9 (L) 13.0 - 17.0 g/dL   HCT 37.6 (L) 39.0 - 52.0 %   MCV 90.0 78.0 - 100.0 fL   MCH 30.9 26.0 - 34.0 pg   MCHC 34.3 30.0 - 36.0 g/dL   RDW 12.5 11.5 - 15.5 %   Platelets 259 150 - 400 K/uL  I-Stat CG4 Lactic Acid, ED     Status: Abnormal   Collection Time: 03/07/15  4:19 AM  Result Value Ref Range   Lactic Acid, Venous 3.38 (HH) 0.5 - 2.0 mmol/L   Comment NOTIFIED PHYSICIAN   Urinalysis, Routine w reflex microscopic (not at [facility])     Status: Abnormal   Collection Time: 03/07/15  4:36 AM  Result Value Ref Range   Color, Urine YELLOW YELLOW   APPearance CLEAR CLEAR   Specific Gravity, Urine 1.030 1.005 - 1.030   pH 6.0 5.0 - 8.0   Glucose, UA >1000 (A) NEGATIVE mg/dL   Hgb urine dipstick TRACE (A) NEGATIVE   Bilirubin Urine NEGATIVE NEGATIVE   Ketones, ur >80 (A) NEGATIVE mg/dL   Protein, ur 30 (A) NEGATIVE mg/dL   Urobilinogen, UA 1.0 0.0 - 1.0 mg/dL   Nitrite NEGATIVE NEGATIVE   Leukocytes, UA NEGATIVE NEGATIVE  Urine microscopic-add on     Status: None   Collection Time: 03/07/15  4:36 AM  Result Value Ref Range   RBC / HPF 0-2 <3 RBC/hpf  Wound culture     Status: None (Preliminary result)  Collection Time: 03/07/15  4:46 AM  Result Value Ref Range   Specimen Description WOUND LEFT FOOT    Special Requests Normal    Gram Stain      MODERATE WBC PRESENT,BOTH PMN AND MONONUCLEAR NO SQUAMOUS EPITHELIAL CELLS SEEN NO  ORGANISMS SEEN Performed at [facility]    Culture PENDING    Report Status PENDING   MRSA PCR Screening     Status: None   Collection Time: 03/07/15  6:28 AM  Result Value Ref Range   MRSA by PCR NEGATIVE NEGATIVE    Comment:        The GeneXpert MRSA Assay (FDA approved for NASAL specimens only), is one component of a comprehensive MRSA colonization surveillance program. It is not intended to diagnose MRSA infection nor to guide or monitor treatment for MRSA infections.   ABO/Rh     Status: None   Collection Time: 03/07/15  7:32 AM  Result Value Ref Range   ABO/RH(D) A POS   CBC with Differential     Status: Abnormal   Collection Time: 03/07/15  8:10 AM  Result Value Ref Range   WBC 7.4 4.0 - 10.5 K/uL   RBC 4.13 (L) 4.22 - 5.81 MIL/uL   Hemoglobin 12.6 (L) 13.0 - 17.0 g/dL   HCT 37.4 (L) 39.0 - 52.0 %   MCV 90.6 78.0 - 100.0 fL   MCH 30.5 26.0 - 34.0 pg   MCHC 33.7 30.0 - 36.0 g/dL   RDW 12.5 11.5 - 15.5 %   Platelets 274 150 - 400 K/uL   Neutrophils Relative % 69 %   Neutro Abs 5.1 1.7 - 7.7 K/uL   Lymphocytes Relative 22 %   Lymphs Abs 1.6 0.7 - 4.0 K/uL   Monocytes Relative 9 %   Monocytes Absolute 0.7 0.1 - 1.0 K/uL   Eosinophils Relative 0 %   Eosinophils Absolute 0.0 0.0 - 0.7 K/uL   Basophils Relative 0 %   Basophils Absolute 0.0 0.0 - 0.1 K/uL  Comprehensive metabolic panel     Status: Abnormal   Collection Time: 03/07/15  8:10 AM  Result Value Ref Range   Sodium 133 (L) 135 - 145 mmol/L   Potassium 3.5 3.5 - 5.1 mmol/L   Chloride 101 101 - 111 mmol/L   CO2 18 (L) 22 - 32 mmol/L   Glucose, Bld 300 (H) 65 - 99 mg/dL   BUN 12 6 - 20 mg/dL   Creatinine, Ser 1.07 0.61 - 1.24 mg/dL   Calcium 8.3 (L) 8.9 - 10.3 mg/dL   Total Protein 7.2 6.5 - 8.1 g/dL   Albumin 2.7 (L) 3.5 - 5.0 g/dL   AST 21 15 - 41 U/L   ALT 12 (L) 17 - 63 U/L   Alkaline Phosphatase 64 38 - 126 U/L   Total Bilirubin 1.3 (H) 0.3 - 1.2 mg/dL   GFR calc non Af Amer >60 >60  mL/min   GFR calc Af Amer >60 >60 mL/min    Comment: (NOTE) The eGFR has been calculated using the CKD EPI equation. This calculation has not been validated in all clinical situations. eGFR's persistently <60 mL/min signify possible Chronic Kidney Disease.    Anion gap 14 5 - 15  Lactic acid, plasma     Status: None   Collection Time: 03/07/15  8:10 AM  Result Value Ref Range   Lactic Acid, Venous 1.9 0.5 - 2.0 mmol/L  Procalcitonin     Status: None   Collection  Time: 03/07/15  8:10 AM  Result Value Ref Range   Procalcitonin <0.10 ng/mL    Comment:        Interpretation: PCT (Procalcitonin) <= 0.5 ng/mL: Systemic infection (sepsis) is not likely. Local bacterial infection is possible. (NOTE)         ICU PCT Algorithm               Non ICU PCT Algorithm    ----------------------------     ------------------------------         PCT < 0.25 ng/mL                 PCT < 0.1 ng/mL     Stopping of antibiotics            Stopping of antibiotics       strongly encouraged.               strongly encouraged.    ----------------------------     ------------------------------       PCT level decrease by               PCT < 0.25 ng/mL       >= 80% from peak PCT       OR PCT 0.25 - 0.5 ng/mL          Stopping of antibiotics                                             encouraged.     Stopping of antibiotics           encouraged.    ----------------------------     ------------------------------       PCT level decrease by              PCT >= 0.25 ng/mL       < 80% from peak PCT        AND PCT >= 0.5 ng/mL            Continuin g antibiotics                                              encouraged.       Continuing antibiotics            encouraged.    ----------------------------     ------------------------------     PCT level increase compared          PCT > 0.5 ng/mL         with peak PCT AND          PCT >= 0.5 ng/mL             Escalation of antibiotics                                           strongly encouraged.      Escalation of antibiotics        strongly encouraged.   Protime-INR     Status: None   Collection Time: 03/07/15  8:10 AM  Result Value Ref Range   Prothrombin Time 14.8 11.6 - 15.2 seconds   INR 1.14 0.00 -  1.49  APTT     Status: None   Collection Time: 03/07/15  8:10 AM  Result Value Ref Range   aPTT 33 24 - 37 seconds  Type and screen     Status: None   Collection Time: 03/07/15  8:10 AM  Result Value Ref Range   ABO/RH(D) A POS    Antibody Screen NEG    Sample Expiration 03/10/2015   Troponin I     Status: None   Collection Time: 03/07/15  8:10 AM  Result Value Ref Range   Troponin I <0.03 <0.031 ng/mL    Comment:        NO INDICATION OF MYOCARDIAL INJURY.   Glucose, capillary     Status: Abnormal   Collection Time: 03/07/15  8:17 AM  Result Value Ref Range   Glucose-Capillary 288 (H) 65 - 99 mg/dL    Dg Abd Acute W/chest  03/07/2015   CLINICAL DATA:  Two week history nausea, vomiting, and diarrhea.  EXAM: DG ABDOMEN ACUTE W/ 1V CHEST  COMPARISON:  Chest 01/05/2014  FINDINGS: Shallow inspiration with linear atelectasis in the lung bases. Heart size and pulmonary vascularity are normal. No focal airspace disease in the lungs.  Scattered gas and stool in the colon. No small or large bowel distention. No free intra-abdominal air. No abnormal air-fluid levels. Surgical clips in the right upper quadrant. Degenerative changes in the spine. No radiopaque stones.  IMPRESSION: Shallow inspiration with linear atelectasis in the lung bases.  Nonobstructive bowel gas pattern.   Electronically Signed   By: Lucienne Capers M.D.   On: 03/07/2015 06:13   Dg Foot Complete Left  03/07/2015   CLINICAL DATA:  Wound. Obvious wounds and odor to the left foot. History of diabetes.  EXAM: LEFT FOOT - COMPLETE 3+ VIEW  COMPARISON:  12/26/2009  FINDINGS: Since previous study, there has been interval removal of surgical fixation devices. There appear to be a  postoperative changes with osteotomies and effusion at the first, second, and possibly third tarsometatarsal joints. Few segments appear intact. No evidence of acute fracture or dislocation. Soft tissue swelling about the first metatarsal-phalangeal joint. There is lucency in the bone at the first metatarsal head, new since prior study. This may indicate evidence of osteomyelitis. No soft tissue gas collections demonstrated. Prominent vascular calcification.  IMPRESSION: Postoperative fusion of the first through third tarsometatarsal joints. Degenerative changes. Soft tissue changes of the first metatarsal phalangeal joint with apparent lucency in the first metatarsal head suggest probable osteomyelitis.   Electronically Signed   By: Lucienne Capers M.D.   On: 03/07/2015 04:28    ROS:  + n/v.  + pain.  + drainage from left foot wound PE:  Blood pressure 134/65, pulse 87, temperature 99 F (37.2 C), temperature source Rectal, resp. rate 27, height 6' 3"  (1.905 m), weight 126.2 kg (278 lb 3.5 oz), SpO2 97 %. wn wd male in moderate distress from nausea.  EOMI.  Resp unlabored.  Gait is wide based and antalgic to the left.  Left foot with ulcers medially at the 1st MT head and 1st TMT joint.  Purulent drainage from wounds with hypertrophic granulation tissue.  1+ dp pulse.  Moderate swelling.  No erythema.  5/5 strength in PF and DF of the ankle and toes.  Assessment/Plan: L foot gangrene and osteomyelitis. I explained the nature of the condition to the patient in detail.  I believe he'll need transmet v. BK amputation to eradicate this infection.  He understands  the plan and agrees with either treatment.  I'll post him for surgery tomorrow.  In the meantime he'll continue with IV abx.  Plan d/w with Dr. Maryland Pink.  IMIR, BRUMBACH 03/07/2015, 12:52 PM

## 2015-03-08 ENCOUNTER — Encounter (HOSPITAL_COMMUNITY)
Admission: EM | Disposition: A | Discharge status: Skilled Nursing Facility | Payer: Self-pay | Source: Home / Self Care | Attending: Internal Medicine

## 2015-03-08 ENCOUNTER — Encounter (HOSPITAL_COMMUNITY): Payer: Self-pay | Admitting: Anesthesiology

## 2015-03-08 ENCOUNTER — Inpatient Hospital Stay (HOSPITAL_COMMUNITY): Payer: Medicare Other | Admitting: Anesthesiology

## 2015-03-08 DIAGNOSIS — E43 Unspecified severe protein-calorie malnutrition: Secondary | ICD-10-CM

## 2015-03-08 HISTORY — PX: AMPUTATION: SHX166

## 2015-03-08 LAB — URINE CULTURE: Culture: NO GROWTH

## 2015-03-08 LAB — BASIC METABOLIC PANEL
Anion gap: 10 (ref 5–15)
BUN: 9 mg/dL (ref 6–20)
CHLORIDE: 102 mmol/L (ref 101–111)
CO2: 23 mmol/L (ref 22–32)
CREATININE: 1.05 mg/dL (ref 0.61–1.24)
Calcium: 7.6 mg/dL — ABNORMAL LOW (ref 8.9–10.3)
GFR calc Af Amer: >60 mL/min (ref >60)
GLUCOSE: 199 mg/dL — AB (ref 65–99)
POTASSIUM: 3.5 mmol/L (ref 3.5–5.1)
SODIUM: 135 mmol/L (ref 135–145)

## 2015-03-08 LAB — CBC
HEMATOCRIT: 35.1 % — AB (ref 39.0–52.0)
Hemoglobin: 11.7 g/dL — ABNORMAL LOW (ref 13.0–17.0)
MCH: 30.7 pg (ref 26.0–34.0)
MCHC: 33.3 g/dL (ref 30.0–36.0)
MCV: 92.1 fL (ref 78.0–100.0)
PLATELETS: 225 10*3/uL (ref 150–400)
RBC: 3.81 MIL/uL — ABNORMAL LOW (ref 4.22–5.81)
RDW: 13 % (ref 11.5–15.5)
WBC: 5.8 10*3/uL (ref 4.0–10.5)

## 2015-03-08 LAB — GLUCOSE, CAPILLARY
GLUCOSE-CAPILLARY: 200 mg/dL — AB (ref 65–99)
GLUCOSE-CAPILLARY: 164 mg/dL — AB (ref 65–99)
GLUCOSE-CAPILLARY: 179 mg/dL — AB (ref 65–99)
Glucose-Capillary: 151 mg/dL — ABNORMAL HIGH (ref 65–99)
Glucose-Capillary: 135 mg/dL — ABNORMAL HIGH (ref 65–99)

## 2015-03-08 SURGERY — AMPUTATION BELOW KNEE
Anesthesia: General | Laterality: Left

## 2015-03-08 MED ORDER — VANCOMYCIN HCL 1000 MG IV SOLR
INTRAVENOUS | Status: AC
  Filled 2015-03-08: qty 1000

## 2015-03-08 MED ORDER — INSULIN ASPART 100 UNIT/ML ~~LOC~~ SOLN
0.0000 [IU] | Freq: Three times a day (TID) | SUBCUTANEOUS | Status: DC
  Administered 2015-03-08 – 2015-03-09 (×2): 3 [IU] via SUBCUTANEOUS
  Administered 2015-03-09 – 2015-03-10 (×2): 2 [IU] via SUBCUTANEOUS
  Administered 2015-03-11: 3 [IU] via SUBCUTANEOUS
  Administered 2015-03-11: 2 [IU] via SUBCUTANEOUS
  Administered 2015-03-12: 3 [IU] via SUBCUTANEOUS
  Administered 2015-03-13 – 2015-03-15 (×6): 2 [IU] via SUBCUTANEOUS

## 2015-03-08 MED ORDER — SENNA 8.6 MG PO TABS
1.0000 | ORAL_TABLET | Freq: Two times a day (BID) | ORAL | Status: DC
  Administered 2015-03-09 – 2015-03-13 (×10): 8.6 mg via ORAL
  Filled 2015-03-08 (×13): qty 1

## 2015-03-08 MED ORDER — ONDANSETRON HCL 4 MG/2ML IJ SOLN
INTRAMUSCULAR | Status: AC
  Filled 2015-03-08: qty 2

## 2015-03-08 MED ORDER — MORPHINE SULFATE (PF) 2 MG/ML IV SOLN
2.0000 mg | INTRAVENOUS | Status: DC | PRN
  Administered 2015-03-08 – 2015-03-09 (×13): 2 mg via INTRAVENOUS
  Filled 2015-03-08 (×13): qty 1

## 2015-03-08 MED ORDER — PIPERACILLIN-TAZOBACTAM 3.375 G IVPB
3.3750 g | INTRAVENOUS | Status: AC
  Administered 2015-03-08: 3.375 g via INTRAVENOUS
  Filled 2015-03-08: qty 50

## 2015-03-08 MED ORDER — LACTATED RINGERS IV SOLN
INTRAVENOUS | Status: DC | PRN
  Administered 2015-03-08: 11:00:00 via INTRAVENOUS

## 2015-03-08 MED ORDER — ROCURONIUM BROMIDE 100 MG/10ML IV SOLN
INTRAVENOUS | Status: AC
  Filled 2015-03-08: qty 1

## 2015-03-08 MED ORDER — BUPIVACAINE LIPOSOME 1.3 % IJ SUSP
20.0000 mL | Freq: Once | INTRAMUSCULAR | Status: AC
  Administered 2015-03-08: 20 mL
  Filled 2015-03-08: qty 20

## 2015-03-08 MED ORDER — VANCOMYCIN HCL 1000 MG IV SOLR
INTRAVENOUS | Status: DC | PRN
  Administered 2015-03-08: 1000 mg via TOPICAL

## 2015-03-08 MED ORDER — ONDANSETRON HCL 4 MG/2ML IJ SOLN
INTRAMUSCULAR | Status: DC | PRN
  Administered 2015-03-08: 4 mg via INTRAVENOUS

## 2015-03-08 MED ORDER — PHENYLEPHRINE 40 MCG/ML (10ML) SYRINGE FOR IV PUSH (FOR BLOOD PRESSURE SUPPORT)
PREFILLED_SYRINGE | INTRAVENOUS | Status: AC
  Filled 2015-03-08: qty 10

## 2015-03-08 MED ORDER — PROMETHAZINE HCL 25 MG/ML IJ SOLN
6.2500 mg | INTRAMUSCULAR | Status: AC | PRN
  Administered 2015-03-08 (×2): 6.25 mg via INTRAVENOUS

## 2015-03-08 MED ORDER — SODIUM CHLORIDE 0.9 % IJ SOLN
INTRAMUSCULAR | Status: DC | PRN
  Administered 2015-03-08: 20 mL

## 2015-03-08 MED ORDER — HYDROMORPHONE HCL 1 MG/ML IJ SOLN
INTRAMUSCULAR | Status: AC
  Filled 2015-03-08: qty 1

## 2015-03-08 MED ORDER — INSULIN ASPART 100 UNIT/ML ~~LOC~~ SOLN
0.0000 [IU] | SUBCUTANEOUS | Status: DC
  Administered 2015-03-08: 3 [IU] via SUBCUTANEOUS

## 2015-03-08 MED ORDER — DOCUSATE SODIUM 100 MG PO CAPS
100.0000 mg | ORAL_CAPSULE | Freq: Two times a day (BID) | ORAL | Status: DC
  Administered 2015-03-09 – 2015-03-15 (×12): 100 mg via ORAL
  Filled 2015-03-08 (×19): qty 1

## 2015-03-08 MED ORDER — SODIUM CHLORIDE 0.9 % IJ SOLN
INTRAMUSCULAR | Status: AC
  Filled 2015-03-08: qty 10

## 2015-03-08 MED ORDER — ACETAMINOPHEN 650 MG RE SUPP: 650.0000 mg | Freq: Four times a day (QID) | RECTAL | Status: DC | PRN

## 2015-03-08 MED ORDER — MIDAZOLAM HCL 5 MG/5ML IJ SOLN
INTRAMUSCULAR | Status: DC | PRN
  Administered 2015-03-08: 0.5 mg via INTRAVENOUS
  Administered 2015-03-08: 1 mg via INTRAVENOUS
  Administered 2015-03-08: 0.5 mg via INTRAVENOUS

## 2015-03-08 MED ORDER — PROMETHAZINE HCL 25 MG/ML IJ SOLN
INTRAMUSCULAR | Status: AC
  Filled 2015-03-08: qty 1

## 2015-03-08 MED ORDER — MIDAZOLAM HCL 2 MG/2ML IJ SOLN
INTRAMUSCULAR | Status: AC
  Filled 2015-03-08: qty 4

## 2015-03-08 MED ORDER — ENOXAPARIN SODIUM 40 MG/0.4ML ~~LOC~~ SOLN
40.0000 mg | SUBCUTANEOUS | Status: DC
  Administered 2015-03-09 – 2015-03-11 (×3): 40 mg via SUBCUTANEOUS
  Filled 2015-03-08 (×3): qty 0.4

## 2015-03-08 MED ORDER — PROPOFOL 10 MG/ML IV BOLUS
INTRAVENOUS | Status: DC | PRN
  Administered 2015-03-08: 250 mg via INTRAVENOUS

## 2015-03-08 MED ORDER — SODIUM CHLORIDE 0.9 % IV SOLN
INTRAVENOUS | Status: DC
Start: 2015-03-08 — End: 2015-03-09
  Administered 2015-03-08: 16:00:00 via INTRAVENOUS

## 2015-03-08 MED ORDER — ACETAMINOPHEN 325 MG PO TABS
650.0000 mg | ORAL_TABLET | Freq: Four times a day (QID) | ORAL | Status: DC | PRN
  Administered 2015-03-09: 650 mg via ORAL
  Filled 2015-03-08: qty 2

## 2015-03-08 MED ORDER — LIDOCAINE HCL (CARDIAC) 20 MG/ML IV SOLN
INTRAVENOUS | Status: DC | PRN
  Administered 2015-03-08: 100 mg via INTRAVENOUS

## 2015-03-08 MED ORDER — PHENYLEPHRINE HCL 10 MG/ML IJ SOLN
INTRAMUSCULAR | Status: DC | PRN
  Administered 2015-03-08 (×2): 80 ug via INTRAVENOUS

## 2015-03-08 MED ORDER — LIDOCAINE HCL (CARDIAC) 20 MG/ML IV SOLN
INTRAVENOUS | Status: AC
Start: 2015-03-08 — End: 2015-03-08
  Filled 2015-03-08: qty 5

## 2015-03-08 MED ORDER — FENTANYL CITRATE (PF) 100 MCG/2ML IJ SOLN
INTRAMUSCULAR | Status: DC | PRN
  Administered 2015-03-08: 100 ug via INTRAVENOUS
  Administered 2015-03-08: 50 ug via INTRAVENOUS

## 2015-03-08 MED ORDER — PROPOFOL 10 MG/ML IV BOLUS
INTRAVENOUS | Status: AC
  Filled 2015-03-08: qty 20

## 2015-03-08 MED ORDER — FENTANYL CITRATE (PF) 250 MCG/5ML IJ SOLN
INTRAMUSCULAR | Status: AC
  Filled 2015-03-08: qty 25

## 2015-03-08 MED ORDER — HYDROMORPHONE HCL 1 MG/ML IJ SOLN
0.2500 mg | INTRAMUSCULAR | Status: DC | PRN
  Administered 2015-03-08 (×3): 0.5 mg via INTRAVENOUS

## 2015-03-08 MED ORDER — SUCCINYLCHOLINE CHLORIDE 20 MG/ML IJ SOLN
INTRAMUSCULAR | Status: DC | PRN
  Administered 2015-03-08: 140 mg via INTRAVENOUS

## 2015-03-08 MED ORDER — SODIUM CHLORIDE 0.9 % IJ SOLN
INTRAMUSCULAR | Status: AC
  Filled 2015-03-08: qty 20

## 2015-03-08 SURGICAL SUPPLY — 49 items
BAG SPEC THK2 15X12 ZIP CLS (MISCELLANEOUS) ×1
BAG ZIPLOCK 12X15 (MISCELLANEOUS) ×3 IMPLANT
BANDAGE ELASTIC 6 VELCRO ST LF (GAUZE/BANDAGES/DRESSINGS) ×3 IMPLANT
BANDAGE ESMARK 6X9 LF (GAUZE/BANDAGES/DRESSINGS) ×1 IMPLANT
BLADE HEX COATED 2.75 (ELECTRODE) ×3 IMPLANT
BLADE SAW RECIPROCATING 77.5 (BLADE) ×6 IMPLANT
BNDG CMPR MED 15X6 ELC VLCR LF (GAUZE/BANDAGES/DRESSINGS) ×1
BNDG COHESIVE 6X5 TAN STRL LF (GAUZE/BANDAGES/DRESSINGS) ×6 IMPLANT
BNDG ELASTIC 6X15 VLCR STRL LF (GAUZE/BANDAGES/DRESSINGS) ×3 IMPLANT
BNDG ESMARK 6X9 LF (GAUZE/BANDAGES/DRESSINGS) ×3
BNDG GAUZE ELAST 4 BULKY (GAUZE/BANDAGES/DRESSINGS) ×3 IMPLANT
CHLORAPREP W/TINT 26ML (MISCELLANEOUS) ×6 IMPLANT
CUFF TOURN SGL QUICK 34 (TOURNIQUET CUFF) ×3
CUFF TRNQT CYL 34X4X40X1 (TOURNIQUET CUFF) ×1 IMPLANT
DRAIN PENROSE 18X1/2 LTX STRL (DRAIN) ×3 IMPLANT
DRAPE INCISE IOBAN 66X45 STRL (DRAPES) ×3 IMPLANT
DRAPE SHEET LG 3/4 BI-LAMINATE (DRAPES) ×3 IMPLANT
DRAPE U-SHAPE 47X51 STRL (DRAPES) ×3 IMPLANT
DRSG EMULSION OIL 3X16 NADH (GAUZE/BANDAGES/DRESSINGS) ×3 IMPLANT
DRSG MEPILEX BORDER 4X12 (GAUZE/BANDAGES/DRESSINGS) ×3 IMPLANT
DRSG MEPILEX BORDER 4X8 (GAUZE/BANDAGES/DRESSINGS) ×3 IMPLANT
DRSG PAD ABDOMINAL 8X10 ST (GAUZE/BANDAGES/DRESSINGS) ×3 IMPLANT
DURAPREP 26ML APPLICATOR (WOUND CARE) ×3 IMPLANT
ELECT REM PT RETURN 9FT ADLT (ELECTROSURGICAL) ×3
ELECTRODE REM PT RTRN 9FT ADLT (ELECTROSURGICAL) ×1 IMPLANT
GAUZE SPONGE 4X4 12PLY STRL (GAUZE/BANDAGES/DRESSINGS) ×6 IMPLANT
GAUZE SPONGE 4X4 16PLY XRAY LF (GAUZE/BANDAGES/DRESSINGS) ×3 IMPLANT
GLOVE BIO SURGEON STRL SZ8 (GLOVE) ×3 IMPLANT
IMMOBILIZER KNEE 22 (SOFTGOODS) ×3 IMPLANT
KIT BASIN OR (CUSTOM PROCEDURE TRAY) ×3 IMPLANT
NS IRRIG 1000ML POUR BTL (IV SOLUTION) ×3 IMPLANT
PACK ORTHO EXTREMITY (CUSTOM PROCEDURE TRAY) ×3 IMPLANT
PADDING CAST COTTON 6X4 STRL (CAST SUPPLIES) ×3 IMPLANT
POSITIONER SURGICAL ARM (MISCELLANEOUS) ×3 IMPLANT
SPONGE LAP 18X18 X RAY DECT (DISPOSABLE) ×3 IMPLANT
STAPLER VISISTAT 35W (STAPLE) ×3 IMPLANT
STOCKINETTE 8 INCH (MISCELLANEOUS) ×6 IMPLANT
SUT ETHILON 3 0 PS 1 (SUTURE) ×6 IMPLANT
SUT MNCRL AB 3-0 PS2 18 (SUTURE) ×18 IMPLANT
SUT PDS AB 0 CT1 36 (SUTURE) ×9 IMPLANT
SUT PDS AB 1 CT1 27 (SUTURE) ×6 IMPLANT
SUT SILK 2 0 (SUTURE) ×2
SUT SILK 2-0 18XBRD TIE 12 (SUTURE) ×1 IMPLANT
SUT VIC AB 2-0 CT1 27 (SUTURE) ×3
SUT VIC AB 2-0 CT1 TAPERPNT 27 (SUTURE) ×1 IMPLANT
SWAB COLLECTION DEVICE MRSA (MISCELLANEOUS) ×3 IMPLANT
TUBE ANAEROBIC SPECIMEN COL (MISCELLANEOUS) ×3 IMPLANT
WATER STERILE IRR 1500ML POUR (IV SOLUTION) ×3 IMPLANT
YANKAUER SUCT BULB TIP 10FT TU (MISCELLANEOUS) ×3 IMPLANT

## 2015-03-08 NOTE — Progress Notes (Addendum)
PROGRESS NOTE  Samuel [name] ZOX:[id number] DOB: [birth year] DOA: 03/07/2015 PCP: [name], MD  HPI/Recap of past 69 hours: 64 year old male with past medical history of obesity and diabetes mellitus as Well as previous osteomyelitis of the left leg admitted on 9/19 for sepsis secondary to large open wound of the left foot with confirmed osteomyelitis under. Patient placed in stepdown unit and started on broad-spectrum antibiotics. Orthopedic surgery who took him for a BKA on 9/20.  Patient seen in postop recovery. Surgery without complications.  Assessment/Plan: Principal Problem:   Sepsis secondary to acute osteomyelitis of left foot: Patient meets criteria given tachypnea, tachycardia and lactic acid level. IV Zosyn and vancomycin. Status post left BKA.  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  Changed to every 4 hours during surgery   Normocytic normochromic anemia: From chronic disease.   Nausea & vomiting: Secondary to infection plus antibiotics plus gastroparesis. See above.   Obesity (BMI 30-39.9): Patient meets criteria with BMI greater than 30   Code Status: DNR  Family Communication: Left message for family  Disposition Plan: Transfer out of unit. Stable. For short-term skilled nursing at end of week   Consultants:  [facility]-orthopedic surgery  Procedures:  9/20: Left BKA  Antibiotics:  IV Zosyn 9/19-present IV vancomycin 9/19-present IV clindamycin 9/191 dose IV Levaquin 9/191 dose  Objective: BP 144/69 mmHg  Pulse 98  Temp(Src) 99 F (37.2 C) (Rectal)  Resp 17  Ht  (1.905 m)  Wt 128.4 kg (283 lb 1.1 oz)  BMI 35.38 kg/m2  SpO2 96%  Intake/Output Summary (Last 24 hours) at 03/08/15 1439 Last data  filed at 03/08/15 1306  Gross per 24 hour  Intake   3450 ml  Output   1900 ml  Net   1550 ml   Filed Weights   03/07/15 0353 03/07/15 0620 03/08/15 0500  Weight: 139.254 kg (307 lb) 126.2 kg (278 lb 3.5 oz) 128.4 kg (283 lb 1.1 oz)    Exam:   General:  Drowsy, postop. No acute distress  Cardiovascular: regular rate and rhythm, S1-S2  Respiratory: clear to auscultation bilaterally  Abdomen: soft, nontender, nondistended, few bowel sounds  Musculoskeletal: Status post left BKA. Leg in soft cast  Data Reviewed: Basic Metabolic Panel:  Recent Labs Lab 03/07/15 0411 03/07/15 0810 03/08/15 0355  NA 133* 133* 135  K 3.8 3.5 3.5  CL 98* 101 102  CO2 19* 18* 23  GLUCOSE 296* 300* 199*  BUN CREATININE 1.12 1.07 1.05  CALCIUM 8.3* 8.3* 7.6*   Liver Function Tests:  Recent Labs Lab 03/07/15 0411 03/07/15 0810  AST 31 21  ALT 13* 12*  ALKPHOS 66 64  BILITOT 1.0 1.3*  PROT 7.3 7.2  ALBUMIN 2.9* 2.7*    Recent Labs Lab 03/07/15 0411  LIPASE 12*   No results for input(s): AMMONIA in the last 168 hours. CBC:  Recent Labs Lab 03/07/15 0411 03/07/15 0810 03/08/15 0355  WBC 6.5 7.4 5.8  NEUTROABS  --  5.1  --   HGB 12.9* 12.6* 11.7*  HCT 37.6* 37.4* 35.1*  MCV 90.0 90.6 92.1  PLT 259 274 225   Cardiac Enzymes:    Recent Labs Lab 03/07/15 0810  TROPONINI <0.03   BNP (last 3 results)  No results for input(s): BNP in the last 8760 hours.  ProBNP (last 3 results) No results for input(s): PROBNP in the last 8760 hours.  CBG:  Recent Labs Lab 03/07/15 1136 03/07/15 1624 03/07/15 2125 03/08/15 0833 03/08/15 1316  GLUCAP 281* 234* 172* 200* 151*    Recent Results (from the past 240 hour(s))  Blood Culture (routine x 2)     Status: None (Preliminary result)   Collection Time: 03/07/15  4:10 AM  Result Value Ref Range Status   Specimen Description BLOOD RIGHT ARM  Final   Special Requests BOTTLES DRAWN AEROBIC AND ANAEROBIC 5CC  Final    Culture   Final    NO GROWTH 1 DAY Performed at [facility]    Report Status PENDING  Incomplete  Urine culture     Status: None   Collection Time: 03/07/15  4:36 AM  Result Value Ref Range Status   Specimen Description URINE, CATHETERIZED  Final   Special Requests NONE  Final   Culture   Final    NO GROWTH 1 DAY Performed at [facility]    Report Status 03/08/2015 FINAL  Final  Wound culture     Status: None (Preliminary result)   Collection Time: 03/07/15  4:46 AM  Result Value Ref Range Status   Specimen Description WOUND LEFT FOOT  Final   Special Requests Normal  Final   Gram Stain   Final    MODERATE WBC PRESENT,BOTH PMN AND MONONUCLEAR NO SQUAMOUS EPITHELIAL CELLS SEEN NO ORGANISMS SEEN Performed at [facility]    Culture   Final    MODERATE STAPHYLOCOCCUS AUREUS Note: RIFAMPIN AND GENTAMICIN SHOULD NOT BE USED AS SINGLE DRUGS FOR TREATMENT OF STAPH INFECTIONS. Performed at [facility]    Report Status PENDING  Incomplete  Blood Culture (routine x 2)     Status: None (Preliminary result)   Collection Time: 03/07/15  4:48 AM  Result Value Ref Range Status   Specimen Description BLOOD LEFT FOREARM  Final   Special Requests BOTTLES DRAWN AEROBIC AND ANAEROBIC 5CC  Final   Culture   Final    NO GROWTH 1 DAY Performed at [facility]    Report Status PENDING  Incomplete  MRSA PCR Screening     Status: None   Collection Time: 03/07/15  6:28 AM  Result Value Ref Range Status   MRSA by PCR NEGATIVE NEGATIVE Final    Comment:        The GeneXpert MRSA Assay (FDA approved for NASAL specimens only), is one component of a comprehensive MRSA colonization surveillance program. It is not intended to diagnose MRSA infection nor to guide or monitor treatment for MRSA infections.      Studies: No results found.  Scheduled Meds: . [MAR Hold] famotidine  20 mg Oral Daily  . [MAR Hold] ferrous sulfate  325 mg Oral QPM  .  [MAR Hold] gabapentin  300 mg Oral TID  . HYDROmorphone      . HYDROmorphone      . [MAR Hold] insulin aspart  0-15 Units Subcutaneous 6 times per day  . [MAR Hold] insulin glargine  50 Units Subcutaneous QHS  . [MAR Hold] linagliptin  5 mg Oral Daily  . [MAR Hold] metoCLOPramide  10 mg Oral QID  . [MAR Hold] pantoprazole  20 mg Oral Daily  . [MAR Hold] pioglitazone  30 mg Oral QHS  . [MAR Hold] piperacillin-tazobactam (ZOSYN)  IV  3.375 g Intravenous  3 times per day  . promethazine      . [MAR Hold] simvastatin  60 mg Oral QPM  . sodium chloride      . sodium chloride      . [MAR Hold] vancomycin  1,000 mg Intravenous Q12H    Continuous Infusions:     Time spent: 15 minutes  Hollice Espy  [facility] Pager [phone]. If 7PM-7AM, please contact night-coverage at [email or web address], password [email or web address] 03/08/2015, 2:39 PM  LOS: 1 day

## 2015-03-08 NOTE — Transfer of Care (Signed)
Immediate Anesthesia Transfer of Care Note  Patient: Samuel [name]  Procedure(s) Performed: Procedure(s):  LEFT AMPUTATION BELOW KNEE (Left)  Patient Location: PACU  Anesthesia Type:General  Level of Consciousness: sedated  Airway & Oxygen Therapy: Patient Spontanous Breathing and Patient connected to face mask oxygen  Post-op Assessment: Report given to RN and Post -op Vital signs reviewed and stable  Post vital signs: Reviewed and stable  Last Vitals:  Filed Vitals:   03/08/15 0900  BP: 112/80  Pulse: 92  Temp: 37.5 C  Resp: 27    Complications: No apparent anesthesia complications

## 2015-03-08 NOTE — Anesthesia Postprocedure Evaluation (Signed)
  Anesthesia Post-op Note  Patient: Samuel [name]  Procedure(s) Performed: Procedure(s) (LRB):  LEFT AMPUTATION BELOW KNEE (Left)  Patient Location: PACU  Anesthesia Type: General  Level of Consciousness: awake and alert   Airway and Oxygen Therapy: Patient Spontanous Breathing  Post-op Pain: mild  Post-op Assessment: Post-op Vital signs reviewed, Patient's Cardiovascular Status Stable, Respiratory Function Stable, Patent Airway and No signs of Nausea or vomiting. Denies increased work of breathing.  Last Vitals:  Filed Vitals:   03/08/15 1445  BP: 131/78  Pulse: 93  Temp: 37.1 C  Resp: 18    Post-op Vital Signs: stable   Complications: No apparent anesthesia complications

## 2015-03-08 NOTE — Op Note (Signed)
NAMEKARIS, EMIG NO.:  [id number]  MEDICAL RECORD NO.:  [id number]  LOCATION:  1411                         FACILITY:  [facility]  PHYSICIAN:  Toni Arthurs, MD        DATE OF BIRTH:  May 24, 1951  DATE OF PROCEDURE:  03/08/2015 DATE OF DISCHARGE:                              OPERATIVE REPORT   PREOPERATIVE DIAGNOSES:  Left foot gangrene and osteomyelitis.  POSTOPERATIVE DIAGNOSIS:  Left foot gangrene and osteomyelitis.  PROCEDURE:  Left below-knee amputation.  SURGEON:  Toni Arthurs, M.D.  ASSISTANT:  Alfredo Martinez, PA-C.  ANESTHESIA:  General.  ESTIMATED BLOOD LOSS:  Minimal.  TOURNIQUET TIME:  46 minutes at 350 mmHg.  COMPLICATIONS:  None apparent.  DISPOSITION:  Extubated, awake, and stable to recovery.  INDICATIONS FOR PROCEDURE:  The patient is a 64 year old male with past medical history significant for diabetes and peripheral neuropathy.  He has had previous Charcot neuroarthropathy of the left foot that was treated with circular frame fixation.  He has developed medial forefoot gangrene as well as osteomyelitis of the first ray.  He has insufficient soft tissue to allow coverage of a transmetatarsal amputation stump.  He therefore presents for below-knee amputation.  He understands the risks and benefits, the alternative treatment options, and elects surgical treatment.  He specifically understands risks of bleeding, infection, nerve damage, blood clots, need for additional surgery, continued pain, revision, amputation, and death.  PROCEDURE IN DETAIL:  After preoperative consent was obtained and the correct operative site was identified.  The patient was brought to the operating room and placed supine on the operating table.  The patient was already on therapeutic IV vancomycin and Zosyn.  Surgical time-out was taken.  Left lower extremity was prepped and draped in standard sterile fashion with a tourniquet around the thigh.  The extremity  was exsanguinated.  The tourniquet was inflated to 350 mmHg.  A posterior flap incision was marked on the skin.  The incision was made 15 cm distal from the medial joint line.  Sharp dissection was carried down through skin and subcutaneous tissues.  The anterior compartment musculature was divided.  Subperiosteal dissection was then carried along the tibia elevating the periosteum about 2 cm.  A 1 cm proximal from the skin incision.  The tibia was cut with a reciprocating saw. The fibula was cut then about 2 cm proximal to this level taking care to protect the surrounding soft tissues.  An amputation knife was then used to cut through the posterior soft tissues contouring the flap appropriately.  The leg was then passed off the field as a specimen to Pathology.  Blood vessels were all suture ligated with 2-0 silk ties. The named nerves were all divided sharply, all placed on gentle traction.  They were allowed to retract into healthy soft tissues.  Cut surface of bone was contoured with a rasp and stump was then irrigated copiously.  The gastrocnemius was then repaired to the anterior fascia using imbricating sutures of 0 PDS.  Subcutaneous tissues was approximated with inverted simple sutures of 3-0 Monocryl.  Horizontal mattress sutures of 3-0 nylon were used to close the skin incision.  A 20 mL of Exparel mixed with 20 mL of normal saline were infiltrated into the deep and superficial tissues of the stump.  Sterile dressings were applied followed by a compression wrap and an Ace bandage.  The knee immobilizer was applied.  Tourniquet was released at 46 minutes.  The patient was awakened from anesthesia and transported to the recovery room in stable condition.  FOLLOWUP PLAN:  The patient will be nonweightbearing on the left lower extremity.  He will resume DVT prophylaxis on postop day 1 and follow up with me in the office in 2 to 3 weeks after surgery.  Alfredo Martinez, PA-C was  present and scrubbed for the duration of the case.  His assistance was essential in positioning the patient, prepping and draping, gaining and maintaining exposure, performing the operation, closing and dressing the wounds, and applying the knee immobilizer.     Toni Arthurs, MD     JH/MEDQ  D:  03/08/2015  T:  03/08/2015  Job:  [id number]

## 2015-03-08 NOTE — Progress Notes (Signed)
Dr. Council Mechanic made aware of patient's CBG results in PACU- 151.

## 2015-03-08 NOTE — Progress Notes (Signed)
Subjective: Pt c/o continued nausea.  Pain well controlled.  He presents now for L transmet v. BK amputation.  On Vanc and Zosyn.  Stable overnight.   Objective: Vital signs in last 24 hours: Temp:  [98.2 F (36.8 C)-99.5 F (37.5 C)] 99.5 F (37.5 C) March 22, 2023 0900) Pulse Rate:  [78-93] 92 03-22-2023 0900) Resp:  [12-27] 27 22-Mar-2023 0900) BP: (97-157)/(36-84) 112/80 mmHg Mar 22, 2023 0900) SpO2:  [95 %-100 %] 96 % Mar 22, 2023 0900) Weight:  [128.4 kg (283 lb 1.1 oz)] 128.4 kg (283 lb 1.1 oz) 03/22/2023 0500)  Intake/Output from previous day: 09/19 0701 - 03/22/2023 0700 In: 5495 [I.V.:3195; IV Piggyback:2300] Out: 1600 [Urine:1600] Intake/Output this shift: Total I/O In: -  Out: 200 [Urine:200]   Recent Labs  03/07/15 0411 03/07/15 0810 Mar 22, 2015 0355  HGB 12.9* 12.6* 11.7*    Recent Labs  03/07/15 0810 Mar 22, 2015 0355  WBC 7.4 5.8  RBC 4.13* 3.81*  HCT 37.4* 35.1*  PLT 274 225    Recent Labs  03/07/15 0810 2015/03/22 0355  NA 133* 135  K 3.5 3.5  CL 101 102  CO2 18* 23  BUN 12 9  CREATININE 1.07 1.05  GLUCOSE 300* 199*  CALCIUM 8.3* 7.6*    Recent Labs  03/07/15 0810  INR 1.14    PE:  [age] wd male in mild distress from nausea.  L LE with medial foot ulcers and purulent drainage.  5/5 strength in PF and DF of the foot.  Assessment/Plan: R foot gangrene and osteomyelitis.  To OR for L BK v. transmet amputation.  The risks and benefits of the alternative treatment options have been discussed in detail.  The patient wishes to proceed with surgery and specifically understands risks of bleeding, infection, nerve damage, blood clots, need for additional surgery, amputation and death.    CHARLENE, COWDREY 22-Mar-2015, 11:31 AM

## 2015-03-08 NOTE — Clinical Documentation Improvement (Signed)
General Surgery Internal Medicine  Please clarify if the patient's Osteomyelitis is also related to his DM2 or unrelated to condition. Document findings in next progress note and include in discharge summary if applicable.   Other  Clinically Undetermined  Supporting Information:  Please exercise your independent, professional judgment when responding. A specific answer is not anticipated or expected.  Thank You, Shellee Milo BSN, RN Health Information Management [place] [phone]

## 2015-03-08 NOTE — Anesthesia Procedure Notes (Signed)
Procedure Name: Intubation Date/Time: 03/08/2015 11:38 AM Performed by: Doran Clay Pre-anesthesia Checklist: Patient identified, Emergency Drugs available, Suction available, Patient being monitored and Timeout performed Patient Re-evaluated:Patient Re-evaluated prior to inductionOxygen Delivery Method: Circle system utilized Preoxygenation: Pre-oxygenation with 100% oxygen Intubation Type: IV induction, Rapid sequence and Cricoid Pressure applied Laryngoscope Size: Mac and 4 Grade View: Grade II Tube type: Oral Tube size: 7.5 mm Number of attempts: 1 Airway Equipment and Method: Stylet Placement Confirmation: ETT inserted through vocal cords under direct vision,  positive ETCO2 and breath sounds checked- equal and bilateral Secured at: 23 cm Tube secured with: Tape Dental Injury: Teeth and Oropharynx as per pre-operative assessment

## 2015-03-08 NOTE — Anesthesia Preprocedure Evaluation (Addendum)
Anesthesia Evaluation  Patient identified by MRN, date of birth, ID band Patient awake  General Assessment Comment: Hypertension  . CAD (coronary artery disease)  . Gastroparesis  . GERD (gastroesophageal reflux disease)  . Hyperlipidemia  . Peripheral neuropathy  . PONV (postoperative nausea and vomiting)  . Shortness of breath    exertion . Depression  . Anxiety  . Pulmonary embolism    hx. of 2012 . Peripheral vascular disease  . OSA (obstructive sleep apnea)    "not bad enough for a mask" . Type II diabetes mellitus  . Charcot's joint disease due to secondary diabetes  . H/O hiatal hernia  . Arthritis    "all over"  . Charcot's joint    "left foot"       Reviewed: Allergy & Precautions, NPO status , Patient's Chart, lab work & pertinent test results  History of Anesthesia Complications (+) PONV and history of anesthetic complications  Airway Mallampati: II  TM Distance: >3 FB Neck ROM: Full    Dental no notable dental hx.    Pulmonary shortness of breath and with exertion, sleep apnea ,    Pulmonary exam normal breath sounds clear to auscultation       Cardiovascular hypertension, Pt. on medications + CAD, + Cardiac Stents and + Peripheral Vascular Disease  Normal cardiovascular exam Rhythm:Regular Rate:Normal  Cardiology office visit with Dr. Allyson Sabal reviewed: 05-01-13.  Stent to mid LAD in 2010 Myoview 2011 [place].  No current symptoms.   Neuro/Psych PSYCHIATRIC DISORDERS Anxiety Depression  Neuromuscular disease    GI/Hepatic Neg liver ROS, hiatal hernia, GERD  Medicated,  Endo/Other  diabetes, Type 2, Oral Hypoglycemic Agents, Insulin Dependent  Renal/GU negative Renal ROS  negative genitourinary   Musculoskeletal  (+) Arthritis ,   Abdominal   Peds negative pediatric ROS (+)  Hematology  (+) anemia ,   Anesthesia Other  Findings   Reproductive/Obstetrics negative OB ROS                         Anesthesia Physical Anesthesia Plan  ASA: III  Anesthesia Plan: General   Post-op Pain Management:    Induction: Intravenous  Airway Management Planned: Oral ETT  Additional Equipment:   Intra-op Plan:   Post-operative Plan: Extubation in OR  Informed Consent: I have reviewed the patients History and Physical, chart, labs and discussed the procedure including the risks, benefits and alternatives for the proposed anesthesia with the patient or authorized representative who has indicated his/her understanding and acceptance.   Dental advisory given  Plan Discussed with: CRNA  Anesthesia Plan Comments: (Chronic nausea. Worse recently with acute illness. Plan OETT.)       Anesthesia Quick Evaluation

## 2015-03-08 NOTE — Brief Op Note (Signed)
03/07/2015 - 03/08/2015  1:04 PM  PATIENT:  Samuel [name]  64 y.o. male  PRE-OPERATIVE DIAGNOSIS:  left foot gangrene  POST-OPERATIVE DIAGNOSIS:  left foot gangrene  Procedure(s):  LEFT AMPUTATION BELOW KNEE  SURGEON:  Toni Arthurs, MD  ASSISTANT:  Alfredo Martinez, PA-C  ANESTHESIA:   General  EBL:  minimal   TOURNIQUET:   Total Tourniquet Time Documented: Thigh (Left) - 46 minutes Total: Thigh (Left) - 46 minutes  COMPLICATIONS:  None apparent  DISPOSITION:  Extubated, awake and stable to recovery.  DICTATION ID:  [id number]

## 2015-03-08 NOTE — Progress Notes (Signed)
Initial Nutrition Assessment  DOCUMENTATION CODES:   Severe malnutrition in context of acute illness/injury, Obesity unspecified  INTERVENTION:  - Diet advancement as medically feasible - RD will continue to monitor for needs and will talk with pt about supplements with diet advancement  NUTRITION DIAGNOSIS:   Inadequate oral intake related to inability to eat as evidenced by NPO status.  GOAL:   Patient will meet greater than or equal to 90% of their needs  MONITOR:   Diet advancement, Weight trends, Labs, I & O's, Skin  REASON FOR ASSESSMENT:   Malnutrition Screening Tool  ASSESSMENT:   64 y.o. male history of diabetes mellitus type 2, CAD status post stenting, hyperlipidemia, previous history of PE status post IVC filter placement presents to the ER because of persistent nausea vomiting or the last few weeks. He also has been having increasing discharge from his left foot wound. In the ER patient was found to be febrile and acute abdominal series was unremarkable. Abdomen appears benign on exam. On exam patient's wound on his left foot has active discharge. Patient states his blood sugar has been persistently running high but he has been compliant with his medications. Over the last 2 days he has missed his insulin dose because of his nausea vomiting which has further worsened. Patient otherwise denies any chest pain or shortness of breath. Denies any productive cough. Has had initially some diarrhea which has stopped at this time. X-rays revealed possible osteomyelitis involving the left foot first metatarsal head.  Pt seen for MST. BMI indicates obesity. Pt has been NPO since admission and has been unable to meet needs. Did not perform physical assessment in respect of pt's comfort this AM.  He reports that he began having N/V/D 2-3 weeks ago. During that time his PO intakes decreased and he has not had solid food for the past 1 week. He last had something to drink on the night  of 9/18 and his last episode of emesis was this day as well. Since 9/18, pt has been dry heaving with no emesis resulting.   He states UBW of 307 lbs and is unsure of amount of weight he may have lost but feels he has lost weight in the past few weeks. Chart review indicates that based on UBW, pt has lost 24 lbs (8% body weight) in the past 2-3 weeks which is significant for time frame.  Will obtain further information from pt at follow-up to allow him to rest this AM. Medications reviewed; Reglan order in place. Labs reviewed; CBGs: 178-288 mg/dL, Ca: 7.6 mg/dL.   Diet Order:  Diet NPO time specified  Skin:  Wound (see comment) (L foot DM ulcer)  Last BM:  9/18  Height:   Ht Readings from Last 1 Encounters:  03/07/15  (1.905 m)    Weight:   Wt Readings from Last 1 Encounters:  03/08/15 283 lb 1.1 oz (128.4 kg)    Ideal Body Weight:  89.09 kg (kg)  BMI:  Body mass index is 35.38 kg/(m^2).  Estimated Nutritional Needs:   Kcal:  1610-9604  Protein:  90-100 grams  Fluid:  >/= 2.5 L/day  EDUCATION NEEDS:   No education needs identified at this time     Trenton Gammon, RD, LDN Inpatient Clinical Dietitian Pager # [phone] After hours/weekend pager # [phone]

## 2015-03-08 NOTE — Progress Notes (Signed)
Pt medications locked up in pharmacy. Pts valuables locked up by security in safe.Signed sheets on charts

## 2015-03-08 NOTE — Progress Notes (Signed)
Foley catheter is not draining. Patient is complaining of bladder fullness. Unable to flush the catheter, looks like occlusion. Catheter removed and patient voided 350 ml of urine without any problem.

## 2015-03-09 LAB — WOUND CULTURE: SPECIAL REQUESTS: NORMAL

## 2015-03-09 LAB — GLUCOSE, CAPILLARY
GLUCOSE-CAPILLARY: 118 mg/dL — AB (ref 65–99)
GLUCOSE-CAPILLARY: 146 mg/dL — AB (ref 65–99)
GLUCOSE-CAPILLARY: 165 mg/dL — AB (ref 65–99)
Glucose-Capillary: 159 mg/dL — ABNORMAL HIGH (ref 65–99)

## 2015-03-09 MED ORDER — OXYCODONE HCL ER 10 MG PO T12A
10.0000 mg | EXTENDED_RELEASE_TABLET | Freq: Two times a day (BID) | ORAL | Status: DC
  Administered 2015-03-09 – 2015-03-15 (×12): 10 mg via ORAL
  Filled 2015-03-09 (×13): qty 1

## 2015-03-09 MED ORDER — POLYETHYLENE GLYCOL 3350 17 G PO PACK
17.0000 g | PACK | Freq: Every day | ORAL | Status: DC
  Administered 2015-03-09 – 2015-03-13 (×5): 17 g via ORAL
  Filled 2015-03-09 (×7): qty 1

## 2015-03-09 NOTE — Clinical Social Work Placement (Signed)
Patient has a bed at [facility] when stable for discharge. Patient & brother, Nedra Hai aware.     Lincoln Maxin, LCSW [facility] Clinical Social Worker cell #: [phone]    CLINICAL SOCIAL WORK PLACEMENT  NOTE  Date:  03/09/2015  Patient Details  Name: Samuel [name] MRN: [id number] Date of Birth: 10-Aug-1950  Clinical Social Work is seeking post-discharge placement for this patient at the Skilled  Nursing Facility level of care (*CSW will initial, date and re-position this form in  chart as items are completed):  Yes   Patient/family provided with [facility] Clinical Social Work Department's list of facilities offering this level of care within the geographic area requested by the patient (or if unable, by the patient's family).  Yes   Patient/family informed of their freedom to choose among providers that offer the needed level of care, that participate in Medicare, Medicaid or managed care program needed by the patient, have an available bed and are willing to accept the patient.  Yes   Patient/family informed of [facility]'s ownership interest in [facility] and [facility], as well as of the fact that they are under no obligation to receive care at these facilities.  PASRR submitted to EDS on 03/09/15     PASRR number received on 03/09/15     Existing PASRR number confirmed on       FL2 transmitted to all facilities in geographic area requested by pt/family on 03/09/15     FL2 transmitted to all facilities within larger geographic area on       Patient informed that his/her managed care company has contracts with or will negotiate with certain facilities, including the following:        Yes   Patient/family informed of bed offers received.  Patient chooses bed at [facility]     Physician recommends and patient chooses bed at      Patient to be transferred to [facility] on  .  Patient to be transferred to facility by       Patient  family notified on   of transfer.  Name of family member notified:        PHYSICIAN       Additional Comment:    _______________________________________________ Arlyss Repress, LCSW 03/09/2015, 4:40 PM

## 2015-03-09 NOTE — Evaluation (Signed)
Physical Therapy Evaluation Patient Details Name: Samuel [name] MRN: [id number] DOB: 1950/11/10 Today's Date: 03/09/2015   History of Present Illness  64 yo male admitted with sepsis. 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 Hx of DM, peripheral neuropathy  Clinical Impression  On eval, pt required Min-Mod +2 for mobility-able to perform lateral scoot transfer, bed to recliner. Pain rated 6/10. Pt still experiencing nausea. Recommend ST rehab at SNF    Follow Up Recommendations SNF    Equipment Recommendations  None recommended by PT    Recommendations for Other Services       Precautions / Restrictions Precautions Precautions: Fall Precaution Comments: L BKA      Mobility  Bed Mobility Overal bed mobility: Needs Assistance Bed Mobility: Supine to Sit     Supine to sit: Mod assist;+2 for physical assistance;+2 for safety/equipment;HOB elevated     General bed mobility comments: Assist for LEs and trunk. Increased time. Moderate reliance on bedrail  Transfers Overall transfer level: Needs assistance   Transfers: Lateral/Scoot Transfers          Lateral/Scoot Transfers: Min assist;+2 physical assistance;+2 safety/equipment;From elevated surface General transfer comment: Assist to stabilize trunk and block from front. Increased time. VCS safety, technique.   Ambulation/Gait             General Gait Details: NT  Stairs            Wheelchair Mobility    Modified Rankin (Stroke Patients Only)       Balance Overall balance assessment: Needs assistance   Sitting balance-Leahy Scale: Good Sitting balance - Comments: LOB posteriorly x1 and to R x 1                                     Pertinent Vitals/Pain Pain Assessment: 0-10 Pain Score: 6  Pain Location: L LE Pain Descriptors / Indicators: Sore Pain Intervention(s): Monitored during session;Repositioned    Home Living Family/patient expects to be discharged to:: Private residence Living  Arrangements: Alone   Type of Home: Apartment         Home Equipment: Dan Humphreys - 2 wheels      Prior Function Level of Independence: Independent               Hand Dominance        Extremity/Trunk Assessment   Upper Extremity Assessment: Defer to OT evaluation           Lower Extremity Assessment: Generalized weakness;LLE deficits/detail   LLE Deficits / Details: hip flex 2/5. KI in place  Cervical / Trunk Assessment: Normal  Communication   Communication: No difficulties  Cognition Arousal/Alertness: Awake/alert Behavior During Therapy: WFL for tasks assessed/performed Overall Cognitive Status: Within Functional Limits for tasks assessed                      General Comments      Exercises        Assessment/Plan    PT Assessment Patient needs continued PT services  PT Diagnosis Generalized weakness;Acute pain   PT Problem List Decreased strength;Decreased activity tolerance;Decreased balance;Decreased mobility;Obesity;Decreased knowledge of use of DME;Pain  PT Treatment Interventions DME instruction;Gait training;Functional mobility training;Therapeutic activities;Patient/family education;Balance training;Therapeutic exercise   PT Goals (Current goals can be found in the Care Plan section) Acute Rehab PT Goals Patient Stated Goal: to feet better. PT Goal Formulation: With patient Time For Goal Achievement: 03/23/15 Potential  to Achieve Goals: Good    Frequency Min 3X/week   Barriers to discharge        Co-evaluation               End of Session   Activity Tolerance: Patient limited by pain;Patient limited by fatigue (limited by nausea) Patient left: in chair;with call bell/phone within reach;with family/visitor present           Time: 1340-1356 PT Time Calculation (min) (ACUTE ONLY): 16 min   Charges:   PT Evaluation $Initial PT Evaluation Tier I: 1 Procedure     PT G Codes:        Rebeca Alert, MPT Pager:  [phone]

## 2015-03-09 NOTE — Evaluation (Signed)
Occupational Therapy Evaluation Patient Details Name: Samuel [name] MRN: [id number] DOB: [birth year] Today's Date: 03/09/2015    History of Present Illness 64 yo male admitted with sepsis. 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 Hx of DM, peripheral neuropathy   Clinical Impression   Patient presenting with decreased ADL, IADL, functional mobility independence secondary to above. Patient independent, but "struggled" PTA. Patient currently requires min>mod assist with ADLs, min assist for scoot pivot transfer. Patient will benefit from acute OT to increase overall independence in the areas of ADLs, functional mobility, and overall safety in order to safely discharge to venue listed below.     Follow Up Recommendations  SNF;Supervision/Assistance - 24 hour    Equipment Recommendations  Other (comment) (TBD next venue of care)    Recommendations for Other Services  None at this time   Precautions / Restrictions Precautions Precautions: Fall Precaution Comments: L BKA Restrictions Weight Bearing Restrictions: Yes LLE Weight Bearing: Non weight bearing    Mobility Bed Mobility Overal bed mobility: Needs Assistance Bed Mobility: Supine to Sit     Supine to sit: Mod assist;+2 for physical assistance;+2 for safety/equipment;HOB elevated     General bed mobility comments: Assist for LEs and trunk. Increased time. Moderate reliance on bedrail  Transfers Overall transfer level: Needs assistance   Transfers: Lateral/Scoot Transfers          Lateral/Scoot Transfers: Min assist;+2 physical assistance;+2 safety/equipment;From elevated surface General transfer comment: Assist to stabilize trunk and block from front. Increased time. VCS safety, technique.     Balance Overall balance assessment: Needs assistance Sitting-balance support: No upper extremity supported;Feet supported Sitting balance-Leahy Scale: Fair Sitting balance - Comments: LOB posteriorly x1 and to R x 1       Standing balance  comment: no standing occurred during OT/PT eval    ADL Overall ADL's : Needs assistance/impaired General ADL Comments: Pt reports he was independent PTA. Pt requires mod assist for LB ADLs and min assist for UB ADLs. Pt with difficulty maintain dynamic sitting balance EOB with min assist from therapist. Pt performed scoot pivot transfer into recliner with min assist from therapist.     Pertinent Vitals/Pain Pain Assessment: 0-10 Pain Score: 6  Pain Location: LLE Pain Descriptors / Indicators: Sore Pain Intervention(s): Monitored during session;Repositioned   Extremity/Trunk Assessment Upper Extremity Assessment Upper Extremity Assessment: Generalized weakness   Lower Extremity Assessment LLE Deficits / Details: hip flex 2/5. KI in place   Cervical / Trunk Assessment Cervical / Trunk Assessment: Normal   Communication Communication Communication: No difficulties   Cognition Arousal/Alertness: Awake/alert Behavior During Therapy: WFL for tasks assessed/performed Overall Cognitive Status: Within Functional Limits for tasks assessed              Home Living Family/patient expects to be discharged to:: Private residence Living Arrangements: Alone   Type of Home: Apartment Bathroom Shower/Tub: Chief Strategy Officer: Standard     Home Equipment: Environmental consultant - 2 wheels   Additional Comments: Pt reports he eventually plans to move to a handicap accessible apartment       Prior Functioning/Environment Level of Independence: Independent     OT Diagnosis: Generalized weakness;Acute pain   OT Problem List: Decreased strength;Decreased range of motion;Decreased activity tolerance;Impaired balance (sitting and/or standing);Decreased knowledge of use of DME or AE;Decreased safety awareness;Pain;Decreased knowledge of precautions   OT Treatment/Interventions: Self-care/ADL training;Therapeutic exercise;Energy conservation;DME and/or AE instruction;Therapeutic  activities;Patient/family education;Balance training    OT Goals(Current goals can be found in the care plan  section) Acute Rehab OT Goals Patient Stated Goal: to feet better. OT Goal Formulation: With patient Time For Goal Achievement: 03/23/15 Potential to Achieve Goals: Good ADL Goals Pt Will Perform Grooming: with modified independence;sitting (EOB unsupported) Pt Will Perform Lower Body Bathing: with min assist;sit to/from stand;with adaptive equipment Pt Will Perform Lower Body Dressing: with min assist;sit to/from stand;with adaptive equipment Pt Will Transfer to Toilet: with min assist;stand pivot transfer;bedside commode Pt/caregiver will Perform Home Exercise Program: Increased strength;Both right and left upper extremity;With theraband;With written HEP provided;With Supervision Additional ADL Goal #1: Pt will perform sit<>stands using RW with min assist in order to increase independence with ADLs and functional mobility/transfers  OT Frequency: Min 2X/week   Barriers to D/C: Decreased caregiver support      Co-evaluation PT/OT/SLP Co-Evaluation/Treatment: Yes Reason for Co-Treatment: For patient/therapist safety   OT goals addressed during session: ADL's and self-care;Strengthening/ROM    End of Session Equipment Utilized During Treatment: Left knee immobilizer Nurse Communication: Mobility status  Activity Tolerance: Other (comment) (pt limited by nausea) Patient left: in chair;with call bell/phone within reach;with family/visitor present   Time: 1341-1356 OT Time Calculation (min): 15 min Charges:  OT General Charges $OT Visit: 1 Procedure OT Evaluation $Initial OT Evaluation Tier I: 1 Procedure  CLAY,PATRICIA , MS, OTR/L, CLT Pager: [phone]  03/09/2015, 2:18 PM

## 2015-03-09 NOTE — Progress Notes (Signed)
Subjective: 1 Day Post-Op Procedure(s) (LRB):  LEFT AMPUTATION BELOW KNEE (Left)  Patient reports pain as mild to moderate.  Patient has been struggling with nausea, but admits to eating a little breakfast this am.  Denies fever, chills.  Denies BM, admits to flatulence.  Objective:   VITALS:  Temp:  [98 F (36.7 C)-100.5 F (38.1 C)] 100.2 F (37.9 C) (09/21 0202) Pulse Rate:  [92-109] 98 (09/21 0202) Resp:  [13-32] 20 (09/21 0202) BP: (114-169)/(54-82) 114/54 mmHg (09/21 0202) SpO2:  [92 %-100 %] 97 % (09/21 0202) Weight:  [127.6 kg (281 lb 4.9 oz)] 127.6 kg (281 lb 4.9 oz) (09/21 0500)  General: WDWN patient in NAD, but is nauseous during examination. Psych:  Appropriate mood and affect. Neuro:  A&O x 3, Moving all extremities, sensation intact to light touch HEENT:  EOMs intact Chest:  Even non-labored respirations Skin:  Knee immobilizer and dressing C/D/I. Extremities: warm/dry, no visible edema, erythmea, or echymosis.  No lymphadenopathy. Pulses: Femoral 2+ MSK:  ROM:  HF to 45 degrees, MMT:  Patient can perform quad set    LABS  Recent Labs  03/07/15 0411 03/07/15 0810 03/08/15 0355  HGB 12.9* 12.6* 11.7*  WBC 6.5 7.4 5.8  PLT 259 274 225    Recent Labs  03/07/15 0810 03/08/15 0355  NA 133* 135  K 3.5 3.5  CL 101 102  CO2 18* 23  BUN 12 9  CREATININE 1.07 1.05  GLUCOSE 300* 199*    Recent Labs  03/07/15 0810  INR 1.14     Assessment/Plan: 1 Day Post-Op Procedure(s) (LRB):  LEFT AMPUTATION BELOW KNEE (Left)  Up with therapy. Advance diet as tolerated. Nausea and pain management adjusted per Medicine team. Stump shrinker sock and stump protector ordered through [employer]. F/u with Dr. Victorino Dike in the clinic in 2 weeks. Plan for D/C to SNF when stable.   Alfredo Martinez, PA-C, [facility] [employer] Office:  [phone]

## 2015-03-09 NOTE — Care Management Important Message (Signed)
Important Message  Patient Details  Name: CHELSEY KIMBERLEY MRN: [id number] Date of Birth: Apr 10, 1951   Medicare Important Message Given:  [name] notification given    Haskell Flirt 03/09/2015, 11:15 AMImportant Message  Patient Details  Name: RAMIN ZOLL MRN: [id number] Date of Birth: 13-Mar-1951   Medicare Important Message Given:  Yes-second notification given    Haskell Flirt 03/09/2015, 11:15 AM

## 2015-03-09 NOTE — Clinical Social Work Note (Signed)
Clinical Social Work Assessment  Patient Details  Name: Samuel [name] MRN: [id number] Date of Birth: [birth year]  Date of referral:  03/09/15               Reason for consult:  Facility Placement                Permission sought to share information with:  Facility Industrial/product designer granted to share information::  Yes, Verbal Permission Granted  Name::        Agency::     Relationship::     Contact Information:     Housing/Transportation Living arrangements for the past 2 months:  Single Family Home Source of Information:  Patient (patient's brother, Samuel [name] via phone) Patient Interpreter Needed:  None Criminal Activity/Legal Involvement Pertinent to Current Situation/Hospitalization:  No - Comment as needed Significant Relationships:  Siblings Lives with:  Self Do you feel safe going back to the place where you live?  No Need for family participation in patient care:  Yes (Comment)  Care giving concerns:  CSW reviewed PT evaluation recommending SNF at discharge.    Social Worker assessment / plan:  CSW spoke with patient & brother, Samuel [name] via phone re: discharge planning.   Employment status:    Database administrator PT Recommendations:  Skilled Nursing Facility Information / Referral to community resources:  Skilled Nursing Facility  Patient/Family's Response to care:  Patient is agreeable with plan for SNF - had been to [facility] in the past but they are not able to offer a bed at this time. CSW awaiting call back from [facility]. Patient's brother states that as backup to [employer], he is familiar with [facility] and would prefer that he go there.   Patient/Family's Understanding of and Emotional Response to Diagnosis, Current Treatment, and Prognosis:   Emotional Assessment Appearance:  Appears stated age Attitude/Demeanor/Rapport:    Affect (typically observed):  Quiet, Pleasant, Calm, Accepting Orientation:  Oriented to  Self, Oriented to Place, Oriented to  Time, Oriented to Situation Alcohol / Substance use:    Psych involvement (Current and /or in the community):     Discharge Needs  Concerns to be addressed:    Readmission within the last 30 days:    Current discharge risk:    Barriers to Discharge:      Arlyss Repress, LCSW 03/09/2015, 4:00 PM

## 2015-03-09 NOTE — Progress Notes (Signed)
PROGRESS NOTE  Samuel [name] ZOX:[id number] DOB: 27-Jul-1950 DOA: 03/07/2015 PCP: [name], MD  HPI/Recap of past 70 hours: 64 year old male with past medical history of obesity and diabetes mellitus as Well as previous osteomyelitis of the left leg admitted on 9/19 for sepsis secondary to large open wound of the left foot with confirmed osteomyelitis under. Patient placed in stepdown unit and started on broad-spectrum antibiotics. Orthopedic surgery who took him for a BKA on 9/20.  Postop day 1, patient's biggest complaint is pain at surgical site. Also quite nauseated and not eating very much.  Assessment/Plan: Principal Problem:   Sepsis secondary to staph aureus secondary to acute osteomyelitis of left foot: Patient meets criteria given tachypnea, tachycardia and lactic acid level. Status post left BKA.  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 Awaiting final cultures. In the meantime, have stop Zosyn. Continue vancomycin.  Have added extended release Oxy  Active Problems:   Coronary artery disease: Stable   Hyperlipidemia    Diabetes mellitus type 2, uncontrolled with severe chronic secondary gastroparesis: IV scheduled Reglan, insulin and sliding scale.  Monitor sugars closely.   Normocytic normochromic anemia: From chronic disease.   Nausea & vomiting: Secondary to infection plus antibiotics plus gastroparesis plus pain plus constipation. See above.   Obesity (BMI 30-39.9): Patient meets criteria with BMI greater than 30  Constipation: No bowel movement in several days. Try MiraLAX.  Code Status: DNR  Family Communication: Spoke with brother by phone  Disposition Plan: Stable For short-term skilled nursing at end of week   Consultants:  [facility]-orthopedic surgery  Procedures:  9/20: Left BKA  Antibiotics:  IV Zosyn 9/19-9/21 IV vancomycin 9/19-present IV clindamycin 9/191 dose IV Levaquin 9/191 dose  Objective: BP  114/54 mmHg  Pulse 98  Temp(Src) 100.2 F (37.9 C) (Oral)  Resp 20  Ht  (1.905 m)  Wt 127.6 kg (281 lb 4.9 oz)  BMI 35.16 kg/m2  SpO2 97%  Intake/Output Summary (Last 24 hours) at 03/09/15 0944 Last data filed at 03/09/15 0830  Gross per 24 hour  Intake 2383.75 ml  Output   1300 ml  Net 1083.75 ml   Filed Weights   03/07/15 0620 03/08/15 0500 03/09/15 0500  Weight: 126.2 kg (278 lb 3.5 oz) 128.4 kg (283 lb 1.1 oz) 127.6 kg (281 lb 4.9 oz)    Exam:   General:  Alert & oriented x 3, moderate distress secondary to pain  Cardiovascular: regular rate and rhythm, S1-S2  Respiratory: clear to auscultation bilaterally, but decreased secondary to body habitus  Abdomen: soft, nontender, nondistended, few bowel sounds  Musculoskeletal: Status post left BKA. Leg in soft cast  Data Reviewed: Basic Metabolic Panel:  Recent Labs Lab 03/07/15 0411 03/07/15 0810 03/08/15 0355  NA 133* 133* 135  K 3.8 3.5 3.5  CL 98* 101 102  CO2 19* 18* 23  GLUCOSE 296* 300* 199*  BUN CREATININE 1.12 1.07 1.05  CALCIUM 8.3* 8.3* 7.6*   Liver Function Tests:  Recent Labs Lab 03/07/15 0411 03/07/15 0810  AST 31 21  ALT 13* 12*  ALKPHOS 66 64  BILITOT 1.0 1.3*  PROT 7.3 7.2  ALBUMIN 2.9* 2.7*    Recent Labs Lab 03/07/15 0411  LIPASE 12*   No results for input(s): AMMONIA in the last 168 hours. CBC:  Recent Labs Lab 03/07/15 0411 03/07/15 0810 03/08/15 0355  WBC 6.5 7.4 5.8  NEUTROABS  --  5.1  --  HGB 12.9* 12.6* 11.7*  HCT 37.6* 37.4* 35.1*  MCV 90.0 90.6 92.1  PLT 259 274 225   Cardiac Enzymes:    Recent Labs Lab 03/07/15 0810  TROPONINI <0.03   BNP (last 3 results) No results for input(s): BNP in the last 8760 hours.  ProBNP (last 3 results) No results for input(s): PROBNP in the last 8760 hours.  CBG:  Recent Labs Lab 03/08/15 1104 03/08/15 1316 03/08/15 1647 03/08/15 2205 03/09/15 0732  GLUCAP 164* 151* 179* 135* 118*     Recent Results (from the past 240 hour(s))  Blood Culture (routine x 2)     Status: None (Preliminary result)   Collection Time: 03/07/15  4:10 AM  Result Value Ref Range Status   Specimen Description BLOOD RIGHT ARM  Final   Special Requests BOTTLES DRAWN AEROBIC AND ANAEROBIC 5CC  Final   Culture   Final    NO GROWTH 1 DAY Performed at [facility]    Report Status PENDING  Incomplete  Urine culture     Status: None   Collection Time: 03/07/15  4:36 AM  Result Value Ref Range Status   Specimen Description URINE, CATHETERIZED  Final   Special Requests NONE  Final   Culture   Final    NO GROWTH 1 DAY Performed at [facility]    Report Status 03/08/2015 FINAL  Final  Wound culture     Status: None (Preliminary result)   Collection Time: 03/07/15  4:46 AM  Result Value Ref Range Status   Specimen Description WOUND LEFT FOOT  Final   Special Requests Normal  Final   Gram Stain   Final    MODERATE WBC PRESENT,BOTH PMN AND MONONUCLEAR NO SQUAMOUS EPITHELIAL CELLS SEEN NO ORGANISMS SEEN Performed at [facility]    Culture   Final    MODERATE STAPHYLOCOCCUS AUREUS Note: RIFAMPIN AND GENTAMICIN SHOULD NOT BE USED AS SINGLE DRUGS FOR TREATMENT OF STAPH INFECTIONS. Performed at [facility]    Report Status PENDING  Incomplete  Blood Culture (routine x 2)     Status: None (Preliminary result)   Collection Time: 03/07/15  4:48 AM  Result Value Ref Range Status   Specimen Description BLOOD LEFT FOREARM  Final   Special Requests BOTTLES DRAWN AEROBIC AND ANAEROBIC 5CC  Final   Culture   Final    NO GROWTH 1 DAY Performed at [facility]    Report Status PENDING  Incomplete  MRSA PCR Screening     Status: None   Collection Time: 03/07/15  6:28 AM  Result Value Ref Range Status   MRSA by PCR NEGATIVE NEGATIVE Final    Comment:        The GeneXpert MRSA Assay (FDA approved for NASAL specimens only), is one component of  a comprehensive MRSA colonization surveillance program. It is not intended to diagnose MRSA infection nor to guide or monitor treatment for MRSA infections.      Studies: No results found.  Scheduled Meds: . docusate sodium  100 mg Oral BID  . enoxaparin (LOVENOX) injection  40 mg Subcutaneous Q24H  . famotidine  20 mg Oral Daily  . ferrous sulfate  325 mg Oral QPM  . gabapentin  300 mg Oral TID  . insulin aspart  0-15 Units Subcutaneous TID WC  . insulin glargine  50 Units Subcutaneous QHS  . linagliptin  5 mg Oral Daily  . metoCLOPramide  10 mg Oral QID  . OxyCODONE  10 mg Oral Q12H  . pantoprazole  20 mg Oral Daily  . pioglitazone  30 mg Oral QHS  . polyethylene glycol  17 g Oral Daily  . senna  1 tablet Oral BID  . simvastatin  60 mg Oral QPM  . vancomycin  1,000 mg Intravenous Q12H    Continuous Infusions:     Time spent: 15 minutes  Hollice Espy  [facility] Pager [phone]. If 7PM-7AM, please contact night-coverage at [email or web address], password [facility] 03/09/2015, 9:44 AM  LOS: 2 days

## 2015-03-10 LAB — GLUCOSE, CAPILLARY
GLUCOSE-CAPILLARY: 127 mg/dL — AB (ref 65–99)
GLUCOSE-CAPILLARY: 120 mg/dL — AB (ref 65–99)
GLUCOSE-CAPILLARY: 135 mg/dL — AB (ref 65–99)
Glucose-Capillary: 110 mg/dL — ABNORMAL HIGH (ref 65–99)

## 2015-03-10 MED ORDER — METOCLOPRAMIDE HCL 5 MG/ML IJ SOLN
10.0000 mg | Freq: Four times a day (QID) | INTRAMUSCULAR | Status: DC
  Administered 2015-03-10 – 2015-03-15 (×21): 10 mg via INTRAVENOUS
  Filled 2015-03-10 (×31): qty 2

## 2015-03-10 MED ORDER — POLYETHYLENE GLYCOL 3350 17 G PO PACK: 17.0000 g | PACK | Freq: Every day | ORAL | Status: DC

## 2015-03-10 MED ORDER — OXYCODONE HCL 5 MG PO TABS: 5.0000 mg | ORAL_TABLET | ORAL | Status: DC | PRN

## 2015-03-10 MED ORDER — FLEET ENEMA 7-19 GM/118ML RE ENEM
1.0000 | ENEMA | Freq: Once | RECTAL | Status: AC
  Administered 2015-03-10: 1 via RECTAL
  Filled 2015-03-10: qty 1

## 2015-03-10 NOTE — Discharge Summary (Addendum)
Discharge Summary  Samuel [name] KDX:[id number] DOB: 20-Jun-1950  PCP: Jani Gravel, MD  Admit date: 03/07/2015 Discharge date: 03/11/2015  Time spent: 35 minutes  Recommendations for Outpatient Follow-up:  1. New Medication: OxyIR 5 mg every 4 hours when necessary for pain 2. New medication: MiraLAX 17 g by mouth daily  3. New medication: Lactulose 30 cc by mouth as needed if no bowel movement every 24 hours 4. Patient should have creatinine rechecked on Friday 9/30 5. New: Boost breeze feeding supplement twice a day between meals  Discharge Diagnoses:  Active Hospital Problems   Diagnosis Date Noted  . Sepsis 03/07/2015  . Protein-calorie malnutrition, severe 03/08/2015  . Osteomyelitis of foot, acute 03/07/2015  . Diabetes mellitus type 2, uncontrolled 03/07/2015  . Normocytic normochromic anemia 03/07/2015  . Obesity (BMI 30-39.9) 03/07/2015  . Nausea & vomiting   . Coronary artery disease 05/01/2013  . Hyperlipidemia 05/01/2013    Resolved Hospital Problems   Diagnosis Date Noted Date Resolved  No resolved problems to display.    Discharge Condition: Improved, being discharged to short-term skilled nursing  Diet recommendation: Carb modified  Filed Weights   03/08/15 0500 03/09/15 0500 03/10/15 0412  Weight: 128.4 kg (283 lb 1.1 oz) 127.6 kg (281 lb 4.9 oz) 127.1 kg (280 lb 3.3 oz)    History of present illness:  64 year old male with past medical history of obesity and diabetes mellitus as Well as previous osteomyelitis of the left leg admitted on 9/19 for sepsis secondary to large open wound of the left foot with confirmed osteomyelitis under. Patient placed in stepdown unit and started on broad-spectrum antibiotics.  Hospital Course:  Principal Problem:   Sepsis secondary to staph aureus from osteomyelitis of left foot cause behind uncontrolled diabetes mellitus: Patient met criteria for sepsis based on tachycardia, fever, left foot osteomyelitis source.  Orthopedic surgery consulted. 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 Wound cultures grew out pansensitive staph aureus, however by postop, patient no longer febrile and with source of infection removed, antibiotics will be discontinued. Blood cultures negative.  Postop, seen by PT who recommended short-term skilled nursing Active Problems:   Coronary artery disease: Stable  Acute kidney injury: Creatinine normal and last checked on 9/20. Rechecked on 9/23 and found to be elevated at 2.04.  With hydration, improved to 1.73 by day of discharge. Patient not on ARB or diuretic.  Recommend that physician at skilled nursing facility recheck renal function on Friday, 9/30.    Hyperlipidemia: Stable  Severe protein calorie malnutrition: Patient meets criteria in the context of acute illness/injury. Seen by nutrition and started on boost breeze twice a day between meals    Diabetes mellitus type 2, uncontrolled with severe chronic secondary gastroparesis: Patient put on insulin plus sliding scale and gastroparesis treated with Reglan    Normocytic normochromic anemia    Nausea & vomiting: In part from constipation and gastroparesis. Responded well to Fleet enema on 9/22. Patient did not have a bowel movement again for several days. Given lactulose prior to discharge which worked well. Have added lactulose 30 cc daily if no bowel movement every 24 hours.   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 If you have any questions about your discharge medications or the care you received while you were in the hospital after you are discharged, you can call the unit and asked to speak with the hospitalist on call if the hospitalist that took care of you is not available. Once you are discharged, your primary care physician will handle any further medical issues. Please note that NO REFILLS for any discharge medications will be authorized once you are discharged, as it is imperative that you return to your primary care physician (or establish a relationship with a primary care physician if you do not have one) for your aftercare needs so that they can reassess your need for medications and monitor your lab values.     Medication List    STOP taking these medications        cephALEXin 500 MG capsule  Commonly known as:  KEFLEX     HYDROcodone-acetaminophen 5-325 MG per tablet  Commonly known as:  NORCO/VICODIN     sulfamethoxazole-trimethoprim 800-160 MG per tablet  Commonly known as:  SEPTRA DS      TAKE these medications        aspirin 325 MG tablet  Take 325 mg by mouth every 6 (six) hours as needed for mild pain (pain).     COLACE 100 MG capsule  Generic drug:  docusate sodium  Take 100 mg by mouth every evening.     ferrous sulfate 325 (65 FE) MG tablet  Take 325 mg by mouth every evening.     gabapentin 300 MG capsule  Commonly known as:  NEURONTIN  Take 300 mg by mouth 3 (three) times daily.     insulin detemir 100 UNIT/ML injection  Commonly known as:  LEVEMIR  Inject 65 Units into the skin 2 (two) times daily.     insulin lispro 100 UNIT/ML injection  Commonly known as:  HUMALOG  Inject 4-7 Units into the skin 3 (three) times daily before meals. Sliding scale     INVOKANA 100 MG Tabs tablet  Generic drug:  canagliflozin  Take 100 mg by mouth every evening.     lansoprazole 30 MG capsule  Commonly known as:  PREVACID    Take 30 mg by mouth 2 (two) times daily.     metoCLOPramide 10 MG tablet  Commonly known as:  REGLAN  Take 10 mg by mouth 4 (four) times daily.     naproxen sodium 220 MG tablet  Commonly known as:  ANAPROX  Take 440 mg by mouth 2 (two) times daily as needed (pain).     oxyCODONE 5 MG immediate release tablet  Commonly known as:  Oxy IR/ROXICODONE  Take 1 tablet (5 mg total) by mouth every 4 (four) hours as needed for severe pain.     pioglitazone 30 MG tablet  Commonly known as:  ACTOS  Take 30 mg by mouth at bedtime.     polyethylene glycol packet  Commonly known as:  MIRALAX / GLYCOLAX  Take 17 g by mouth daily.     promethazine 25 MG tablet  Commonly known as:  PHENERGAN  Take 25 mg by mouth every 6 (six) hours as needed for nausea or vomiting.  ranitidine 150 MG tablet  Commonly known as:  ZANTAC  Take 150 mg by mouth daily as needed for heartburn.     simvastatin 40 MG tablet  Commonly known as:  ZOCOR  Take 60 mg by mouth every evening. Take 1 and 1/2 tablets daily     sitaGLIPtin 100 MG tablet  Commonly known as:  JANUVIA  Take 100 mg by mouth every evening.       Allergies  Allergen Reactions  . Codeine     REACTION: GI Upset  . Gabapentin Other (See Comments)    REACTION: rash/hives (per patient, was a reaction to an inactive ingredient in another mgf brand). The patient stated that he does take this medication now and it doesn't cause a rash any more.  . Nabumetone     REACTION: sick on stomach and rash       Follow-up Information    Follow up with HEWITT, Jenny Reichmann, MD. Schedule an appointment as soon as possible for a visit in 2 weeks.   Specialty:  Orthopedic Surgery   Contact information:   [address] [place] [address] [phone]        The results of significant diagnostics from this hospitalization (including imaging, microbiology, ancillary and laboratory) are listed below for reference.    Significant  Diagnostic Studies: Dg Abd Acute W/chest  03/07/2015   CLINICAL DATA:  Two week history nausea, vomiting, and diarrhea.  EXAM: DG ABDOMEN ACUTE W/ 1V CHEST  COMPARISON:  Chest 01/05/2014  FINDINGS: Shallow inspiration with linear atelectasis in the lung bases. Heart size and pulmonary vascularity are normal. No focal airspace disease in the lungs.  Scattered gas and stool in the colon. No small or large bowel distention. No free intra-abdominal air. No abnormal air-fluid levels. Surgical clips in the right upper quadrant. Degenerative changes in the spine. No radiopaque stones.  IMPRESSION: Shallow inspiration with linear atelectasis in the lung bases.  Nonobstructive bowel gas pattern.   Electronically Signed   By: Lucienne Capers M.D.   On: 03/07/2015 06:13   Dg Foot Complete Left  03/07/2015   CLINICAL DATA:  Wound. Obvious wounds and odor to the left foot. History of diabetes.  EXAM: LEFT FOOT - COMPLETE 3+ VIEW  COMPARISON:  12/26/2009  FINDINGS: Since previous study, there has been interval removal of surgical fixation devices. There appear to be a postoperative changes with osteotomies and effusion at the first, second, and possibly third tarsometatarsal joints. Few segments appear intact. No evidence of acute fracture or dislocation. Soft tissue swelling about the first metatarsal-phalangeal joint. There is lucency in the bone at the first metatarsal head, new since prior study. This may indicate evidence of osteomyelitis. No soft tissue gas collections demonstrated. Prominent vascular calcification.  IMPRESSION: Postoperative fusion of the first through third tarsometatarsal joints. Degenerative changes. Soft tissue changes of the first metatarsal phalangeal joint with apparent lucency in the first metatarsal head suggest probable osteomyelitis.   Electronically Signed   By: Lucienne Capers M.D.   On: 03/07/2015 04:28    Microbiology: Recent Results (from the past 240 hour(s))  Blood Culture  (routine x 2)     Status: None (Preliminary result)   Collection Time: 03/07/15  4:10 AM  Result Value Ref Range Status   Specimen Description BLOOD RIGHT ARM  Final   Special Requests BOTTLES DRAWN AEROBIC AND ANAEROBIC 5CC  Final   Culture   Final    NO GROWTH 3 DAYS Performed at  [facility]    Report Status PENDING  Incomplete  Urine culture     Status: None   Collection Time: 03/07/15  4:36 AM  Result Value Ref Range Status   Specimen Description URINE, CATHETERIZED  Final   Special Requests NONE  Final   Culture   Final    NO GROWTH 1 DAY Performed at [facility]    Report Status 03/08/2015 FINAL  Final  Wound culture     Status: None   Collection Time: 03/07/15  4:46 AM  Result Value Ref Range Status   Specimen Description WOUND LEFT FOOT  Final   Special Requests Normal  Final   Gram Stain   Final    MODERATE WBC PRESENT,BOTH PMN AND MONONUCLEAR NO SQUAMOUS EPITHELIAL CELLS SEEN NO ORGANISMS SEEN Performed at [facility]    Culture   Final    MODERATE STAPHYLOCOCCUS AUREUS Note: RIFAMPIN AND GENTAMICIN SHOULD NOT BE USED AS SINGLE DRUGS FOR TREATMENT OF STAPH INFECTIONS. Performed at [facility]    Report Status 03/09/2015 FINAL  Final   Organism ID, Bacteria STAPHYLOCOCCUS AUREUS  Final      Susceptibility   Staphylococcus aureus - MIC*    CLINDAMYCIN <=0.25 SENSITIVE Sensitive     ERYTHROMYCIN 0.5 SENSITIVE Sensitive     GENTAMICIN <=0.5 SENSITIVE Sensitive     LEVOFLOXACIN 0.25 SENSITIVE Sensitive     OXACILLIN 0.5 SENSITIVE Sensitive     RIFAMPIN <=0.5 SENSITIVE Sensitive     TRIMETH/SULFA <=10 SENSITIVE Sensitive     VANCOMYCIN 1 SENSITIVE Sensitive     TETRACYCLINE <=1 SENSITIVE Sensitive     MOXIFLOXACIN <=0.25 SENSITIVE Sensitive     * MODERATE STAPHYLOCOCCUS AUREUS  Blood Culture (routine x 2)     Status: None (Preliminary result)   Collection Time: 03/07/15  4:48 AM  Result Value Ref Range Status   Specimen  Description BLOOD LEFT FOREARM  Final   Special Requests BOTTLES DRAWN AEROBIC AND ANAEROBIC 5CC  Final   Culture   Final    NO GROWTH 3 DAYS Performed at [facility]    Report Status PENDING  Incomplete  MRSA PCR Screening     Status: None   Collection Time: 03/07/15  6:28 AM  Result Value Ref Range Status   MRSA by PCR NEGATIVE NEGATIVE Final    Comment:        The GeneXpert MRSA Assay (FDA approved for NASAL specimens only), is one component of a comprehensive MRSA colonization surveillance program. It is not intended to diagnose MRSA infection nor to guide or monitor treatment for MRSA infections.      Labs: Basic Metabolic Panel:  Recent Labs Lab 03/07/15 0411 03/07/15 0810 03/08/15 0355  NA 133* 133* 135  K 3.8 3.5 3.5  CL 98* 101 102  CO2 19* 18* 23  GLUCOSE 296* 300* 199*  BUN 13 12 9   CREATININE 1.12 1.07 1.05  CALCIUM 8.3* 8.3* 7.6*   Liver Function Tests:  Recent Labs Lab 03/07/15 0411 03/07/15 0810  AST 31 21  ALT 13* 12*  ALKPHOS 66 64  BILITOT 1.0 1.3*  PROT 7.3 7.2  ALBUMIN 2.9* 2.7*    Recent Labs Lab 03/07/15 0411  LIPASE 12*   No results for input(s): AMMONIA in the last 168 hours. CBC:  Recent Labs Lab 03/07/15 0411 03/07/15 0810 03/08/15 0355  WBC 6.5 7.4 5.8  NEUTROABS  --  5.1  --   HGB 12.9* 12.6* 11.7*  HCT 37.6* 37.4* 35.1*  MCV 90.0 90.6 92.1  PLT 259 274 225   Cardiac Enzymes:  Recent Labs Lab 03/07/15 0810  TROPONINI <0.03   BNP: BNP (last 3 results) No results for input(s): BNP in the last 8760 hours.  ProBNP (last 3 results) No results for input(s): PROBNP in the last 8760 hours.  CBG:  Recent Labs Lab 03/09/15 1657 03/09/15 2111 03/10/15 0748 03/10/15 1148 03/10/15 1632  GLUCAP 159* 165* 127* 110* 120*       Signed:  Alontae Chaloux K  [facility] 03/10/2015, 5:03 PM

## 2015-03-10 NOTE — Progress Notes (Signed)
Subjective: 2 Days Post-Op Procedure(s) (LRB):  LEFT AMPUTATION BELOW KNEE (Left)  Patient reports pain as mild to moderate.  Nausea has significantly subsided this am.  Reports that he was able to eat and keep down some broth.  Continues to deny BM, but admits to flatulence.  Denies fever, chills.  Resting comfortably in bed upon arrival.  Stump shrinker and protector arrived yesterday.  Objective:   VITALS:  Temp:  [98.7 F (37.1 C)-98.8 F (37.1 C)] 98.7 F (37.1 C) (09/22 0412) Pulse Rate:  [92-101] 92 (09/22 0412) Resp:  [18] 18 (09/22 0412) BP: (104-147)/(66-86) 144/66 mmHg (09/22 0412) SpO2:  [93 %-97 %] 94 % (09/22 0412) Weight:  [127.1 kg (280 lb 3.3 oz)] 127.1 kg (280 lb 3.3 oz) (09/22 0412)  General: WDWN patient in NAD. Psych:  Appropriate mood and affect. Neuro:  A&O x 3, Moving all extremities, sensation intact to light touch HEENT:  EOMs intact Chest:  Even non-labored respirations Skin: Stump protector, stump shrinker, knee immobilizer, and dressing are C/D/I. Extremities: warm/dry, no edema, erythmea, or echymosis.  No lymphadenopathy. Pulses: Femoral 2+ MSK:  ROM: KF to 60 degrees, MMT: able to perform quad set    LABS  Recent Labs  03/08/15 0355  HGB 11.7*  WBC 5.8  PLT 225    Recent Labs  03/08/15 0355  NA 135  K 3.5  CL 102  CO2 23  BUN 9  CREATININE 1.05  GLUCOSE 199*   No results for input(s): LABPT, INR in the last 72 hours.   Assessment/Plan: 2 Days Post-Op Procedure(s) (LRB):  LEFT AMPUTATION BELOW KNEE (Left)  Up with therapy  Elevate operative extremity Advance diet as tolerated Plan for D/C when stable to SNF and bed available To f/u with Dr. Victorino Dike in the clinic for 2 week postop visit.   Alfredo Martinez, PA-C, ATC [employer] Office:  [phone]

## 2015-03-10 NOTE — Progress Notes (Signed)
OT Cancellation Note  Patient Details Name: Samuel [name] MRN: [id number] DOB: Dec 04, 1950   Cancelled Treatment:    Reason Eval/Treat Not Completed: Medical issues which prohibited therapy  Pt is limited by nausea  SPENCER,MARYELLEN 03/10/2015, 12:26 PM  Marica Otter, OTR/L [phone] 03/10/2015

## 2015-03-10 NOTE — Care Management Note (Signed)
Case Management Note  Patient Details  Name: Samuel [name] MRN: [id number] Date of Birth: 1950/12/05  Subjective/Objective:  Transfer from SDU.POD#2 L BKA, nausea.Ortho following. PT-SNF.CSW following.                  Action/Plan:d/c SNF   Expected Discharge Date:   (unknown)               Expected Discharge Plan:  Skilled Nursing Facility  In-House Referral:  Clinical Social Work  Discharge planning Services  CM Consult  Post Acute Care Choice:  NA Choice offered to:  NA  DME Arranged:    DME Agency:     HH Arranged:    HH Agency:     Status of Service:  In process, will continue to follow  Medicare Important Message Given:  Yes-second notification given Date Medicare IM Given:    Medicare IM give by:    Date Additional Medicare IM Given:    Additional Medicare Important Message give by:     If discussed at Long Length of Stay Meetings, dates discussed:    Additional Comments:  Lanier Clam, RN 03/10/2015, 1:22 PM

## 2015-03-11 DIAGNOSIS — I25111 Atherosclerotic heart disease of native coronary artery with angina pectoris with documented spasm: Secondary | ICD-10-CM

## 2015-03-11 DIAGNOSIS — E785 Hyperlipidemia, unspecified: Secondary | ICD-10-CM

## 2015-03-11 DIAGNOSIS — M869 Osteomyelitis, unspecified: Secondary | ICD-10-CM | POA: Insufficient documentation

## 2015-03-11 LAB — COMPREHENSIVE METABOLIC PANEL
ALK PHOS: 49 U/L (ref 38–126)
ALT: 12 U/L — ABNORMAL LOW (ref 17–63)
ANION GAP: 8 (ref 5–15)
AST: 27 U/L (ref 15–41)
Albumin: 2.2 g/dL — ABNORMAL LOW (ref 3.5–5.0)
BILIRUBIN TOTAL: 0.7 mg/dL (ref 0.3–1.2)
BUN: 10 mg/dL (ref 6–20)
CALCIUM: 8.3 mg/dL — AB (ref 8.9–10.3)
CO2: 26 mmol/L (ref 22–32)
Chloride: 106 mmol/L (ref 101–111)
Creatinine, Ser: 2.04 mg/dL — ABNORMAL HIGH (ref 0.61–1.24)
GFR, EST AFRICAN AMERICAN: 39 mL/min — AB (ref >60)
GFR, EST NON AFRICAN AMERICAN: 33 mL/min — AB (ref >60)
Glucose, Bld: 108 mg/dL — ABNORMAL HIGH (ref 65–99)
Potassium: 3.6 mmol/L (ref 3.5–5.1)
SODIUM: 140 mmol/L (ref 135–145)
TOTAL PROTEIN: 6.4 g/dL — AB (ref 6.5–8.1)

## 2015-03-11 LAB — CBC
HEMATOCRIT: 35.9 % — AB (ref 39.0–52.0)
Hemoglobin: 11.4 g/dL — ABNORMAL LOW (ref 13.0–17.0)
MCH: 29.8 pg (ref 26.0–34.0)
MCHC: 31.8 g/dL (ref 30.0–36.0)
MCV: 94 fL (ref 78.0–100.0)
Platelets: 270 10*3/uL (ref 150–400)
RBC: 3.82 MIL/uL — ABNORMAL LOW (ref 4.22–5.81)
RDW: 13.1 % (ref 11.5–15.5)
WBC: 5.9 10*3/uL (ref 4.0–10.5)

## 2015-03-11 LAB — GLUCOSE, CAPILLARY
GLUCOSE-CAPILLARY: 154 mg/dL — AB (ref 65–99)
GLUCOSE-CAPILLARY: 153 mg/dL — AB (ref 65–99)
Glucose-Capillary: 115 mg/dL — ABNORMAL HIGH (ref 65–99)
Glucose-Capillary: 136 mg/dL — ABNORMAL HIGH (ref 65–99)

## 2015-03-11 MED ORDER — ENOXAPARIN SODIUM 40 MG/0.4ML ~~LOC~~ SOLN
40.0000 mg | SUBCUTANEOUS | Status: DC
  Administered 2015-03-12 – 2015-03-15 (×4): 40 mg via SUBCUTANEOUS
  Filled 2015-03-11 (×4): qty 0.4

## 2015-03-11 MED ORDER — SODIUM CHLORIDE 0.9 % IV BOLUS (SEPSIS)
250.0000 mL | Freq: Once | INTRAVENOUS | Status: AC
  Administered 2015-03-11: 250 mL via INTRAVENOUS

## 2015-03-11 MED ORDER — SODIUM CHLORIDE 0.9 % IV SOLN
INTRAVENOUS | Status: AC
  Administered 2015-03-11 (×2): via INTRAVENOUS

## 2015-03-11 NOTE — Progress Notes (Signed)
Subjective: 3 Days Post-Op Procedure(s) (LRB):  LEFT AMPUTATION BELOW KNEE (Left)  Patient reports pain as mild to moderate.  Continues to c/o nausea.  Reports that he hasn't been able to eat, but admits to finally having several BMs yesterday.  Resting comfortably in bed.  Objective:   VITALS:  Temp:  [98.1 F (36.7 C)-98.6 F (37 C)] 98.1 F (36.7 C) (09/23 0538) Pulse Rate:  [86-93] 86 (09/23 0538) Resp:  [18] 18 (09/23 0538) BP: (150-161)/(75-78) 155/75 mmHg (09/23 0538) SpO2:  [95 %-96 %] 95 % (09/23 0538)  General: WDWN patient in NAD. Psych:  Appropriate mood and affect. Neuro:  A&O x 3, Moving all extremities, sensation intact to light touch HEENT:  EOMs intact Chest:  Even non-labored respirations Skin:  Stump shrinker, stump protector, knee immobilizer, and dressing C/D/I. Extremities: warm/dry, no visible edema, erythmea, or echymosis.  No lymphadenopathy. Pulses: Femoral 2+ MSK:  ROM: KF to 60 degrees, MMT: can perform quad set    LABS  Recent Labs  03/11/15 0505  HGB 11.4*  WBC 5.9  PLT 270   No results for input(s): NA, K, CL, CO2, BUN, CREATININE, GLUCOSE in the last 72 hours. No results for input(s): LABPT, INR in the last 72 hours.   Assessment/Plan: 3 Days Post-Op Procedure(s) (LRB):  LEFT AMPUTATION BELOW KNEE (Left)  Up with therapy  Continue with stump shrinker, stump protector, and knee immobilizer Planned D/C to SNF F/u with Dr. Victorino Dike in clinic for 2 week postop exam.  Alfredo Martinez, PA-C, ATC [facility] Office:  [phone]

## 2015-03-11 NOTE — Progress Notes (Signed)
Physical Therapy Treatment Patient Details Name: Samuel [name] MRN: [id number] DOB: [birth year] Today's Date: 03/11/2015    History of Present Illness 64 yo male admitted with sepsis. 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 Hx of DM, peripheral neuropathy    PT Comments    Progressing with mobility. Did not attempt standing-did not have +2 assist available. Lateral scoot bed>recliner. Recommend ST rehab at SNF  Follow Up Recommendations  SNF     Equipment Recommendations  None recommended by PT    Recommendations for Other Services       Precautions / Restrictions Precautions Precautions: Fall Precaution Comments: L BKA    Mobility  Bed Mobility Overal bed mobility: Needs Assistance Bed Mobility: Supine to Sit     Supine to sit: Min assist;HOB elevated     General bed mobility comments: Assist for trunk.  Transfers Overall transfer level: Needs assistance Equipment used: Rolling walker (2 wheeled) Transfers: Lateral/Scoot Transfers          Lateral/Scoot Transfers: Min assist General transfer comment: small amount of assist to stabilize. VCs safety, technique.   Ambulation/Gait                 Stairs            Wheelchair Mobility    Modified Rankin (Stroke Patients Only)       Balance     Sitting balance-Leahy Scale: Good                              Cognition Arousal/Alertness: Awake/alert Behavior During Therapy: WFL for tasks assessed/performed Overall Cognitive Status: Within Functional Limits for tasks assessed                      Exercises General Exercises - Lower Extremity Hip ABduction/ADduction: AROM;Left;5 reps;Supine Hip Flexion/Marching: AROM;Left;5 reps;Supine    General Comments        Pertinent Vitals/Pain Pain Assessment: 0-10 Pain Location: L LE with mobility/exercises Pain Descriptors / Indicators: Sore Pain Intervention(s): Monitored during session;Repositioned    Home Living                      Prior Function            PT Goals (current goals can now be found in the care plan section) Progress towards PT goals: Progressing toward goals    Frequency  Min 3X/week    PT Plan Current plan remains appropriate    Co-evaluation             End of Session   Activity Tolerance: Patient tolerated treatment well Patient left: in chair;with call bell/phone within reach     Time: 1341-1400 PT Time Calculation (min) (ACUTE ONLY): 19 min  Charges:  $Therapeutic Activity: 8-22 mins                    G Codes:      Rebeca Alert, MPT Pager: [phone]

## 2015-03-11 NOTE — Progress Notes (Addendum)
[facility] PROGRESS NOTE  MERIT MAYBEE WNU:[id number] DOB: 1950/12/21 DOA: 03/07/2015 PCP: Jani Gravel, MD  Assessment/Plan: Principal Problem:   Sepsis Active Problems:   Coronary artery disease   Hyperlipidemia   Osteomyelitis of foot, acute   Diabetes mellitus type 2, uncontrolled   Normocytic normochromic anemia   Nausea & vomiting   Obesity (BMI 30-39.9)   Protein-calorie malnutrition, severe     History of present illness:  64 year old male with past medical history of obesity and diabetes mellitus as Well as previous osteomyelitis of the left leg admitted on 9/19 for sepsis secondary to large open wound of the left foot with confirmed osteomyelitis under. Patient placed in stepdown unit and started on broad-spectrum antibiotics.  Hospital Course:   :  Sepsis secondary to staph aureus from osteomyelitis of left foot cause behind uncontrolled diabetes mellitus: Patient met criteria for sepsis based on tachycardia, fever, left foot osteomyelitis source. Orthopedic surgery consulted. 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 Wound cultures grew out pansensitive staph aureus, however by postop, patient no longer febrile and with source of infection removed, antibiotics will be discontinued. Blood cultures negative. Postop, seen by PT who recommended short-term skilled nursing    Acute kidney injury cr has doubled since 9/21, hold off on DC , likely prerenal due to poor oral inatke , recheck in am after hydration , if improving then pt can DC to SNF in am , social worker aware   Coronary artery disease: Stable   Hyperlipidemia: Stable   Diabetes mellitus type 2, uncontrolled with severe chronic secondary gastroparesis: Patient put on insulin plus sliding scale and gastroparesis treated with Reglan   Normocytic normochromic anemia   Nausea & vomiting: In part from constipation and gastroparesis. Following Fleet enema, patient  will be  discharged on daily MiraLAX for bowel regimen   morbid obesity: Patient meets criteria with BMI greater than 30    Consultants:  [facility]-orthopedic surgery  Procedures:  9/20: Left BKA   DVT prophylaxsis started on Lovenox  Code Status:      Code Status Orders        Start     Ordered   03/08/15 1533  Full code   Continuous     03/08/15 1532     Family Communication: family updated about patient's clinical progress Disposition Plan: anticipate DC tomorrow if renal fn is better       Antibiotics: Anti-infectives    Start     Dose/Rate Route Frequency Ordered Stop   03/08/15 1715  piperacillin-tazobactam (ZOSYN) IVPB 3.375 g     3.375 g 12.5 mL/hr over 240 Minutes Intravenous NOW 03/08/15 1707 03/08/15 2110   03/08/15 1235  vancomycin (VANCOCIN) powder  Status:  Discontinued       As needed 03/08/15 1235 03/08/15 1315   03/07/15 1400  piperacillin-tazobactam (ZOSYN) IVPB 3.375 g  Status:  Discontinued     3.375 g 12.5 mL/hr over 240 Minutes Intravenous 3 times per day 03/07/15 0852 03/09/15 0944   03/07/15 1000  vancomycin (VANCOCIN) IVPB 1000 mg/200 mL premix  Status:  Discontinued     1,000 mg 200 mL/hr over 60 Minutes Intravenous Every 12 hours 03/07/15 0852 03/10/15 0741   03/07/15 0900  vancomycin (VANCOCIN) IVPB 1000 mg/200 mL premix     1,000 mg 200 mL/hr over 60 Minutes Intravenous  Once 03/07/15 0733 03/07/15 0920   03/07/15 0745  piperacillin-tazobactam (ZOSYN) IVPB 3.375 g  3.375 g 100 mL/hr over 30 Minutes Intravenous  Once 03/07/15 0733 03/07/15 0837   03/07/15 0430  clindamycin (CLEOCIN) IVPB 900 mg     900 mg 100 mL/hr over 30 Minutes Intravenous  Once 03/07/15 0424 03/07/15 0534   03/07/15 0430  levofloxacin (LEVAQUIN) IVPB 750 mg     750 mg 100 mL/hr over 90 Minutes Intravenous  Once 03/07/15 0424 03/08/15 1702         HPI/Subjective: Nausea is better ,he is able to eat his meals , had BM this am   Objective: Filed  Vitals:   03/10/15 0412 03/10/15 1354 03/10/15 2010 03/11/15 0538  BP: 144/66 150/78 161/77 155/75  Pulse: 92 93 92 86  Temp: 98.7 F (37.1 C) 98.4 F (36.9 C) 98.6 F (37 C) 98.1 F (36.7 C)  TempSrc: Oral Oral Oral Oral  Resp: 18 18 18 18   Height:      Weight: 127.1 kg (280 lb 3.3 oz)     SpO2: 94% 95% 96% 95%    Intake/Output Summary (Last 24 hours) at 03/11/15 1050 Last data filed at 03/11/15 0900  Gross per 24 hour  Intake    360 ml  Output    975 ml  Net   -615 ml    Exam:  General: Alert and oriented 3, mild distress secondary to nausea  Cardiovascular: regular rate and rhythm, S1-S2  Respiratory: clear to auscultation bilaterally Abdomen: Nontender, nondistended, soft, bowel sounds positive, no rebound, no ascites, no appreciable mass Extremities: No significant cyanosis, clubbing, or edema bilateral lower extremities     Data Review   Micro Results Recent Results (from the past 240 hour(s))  Blood Culture (routine x 2)     Status: None (Preliminary result)   Collection Time: 03/07/15  4:10 AM  Result Value Ref Range Status   Specimen Description BLOOD RIGHT ARM  Final   Special Requests BOTTLES DRAWN AEROBIC AND ANAEROBIC 5CC  Final   Culture   Final    NO GROWTH 3 DAYS Performed at [facility]    Report Status PENDING  Incomplete  Urine culture     Status: None   Collection Time: 03/07/15  4:36 AM  Result Value Ref Range Status   Specimen Description URINE, CATHETERIZED  Final   Special Requests NONE  Final   Culture   Final    NO GROWTH 1 DAY Performed at [facility]    Report Status 03/08/2015 FINAL  Final  Wound culture     Status: None   Collection Time: 03/07/15  4:46 AM  Result Value Ref Range Status   Specimen Description WOUND LEFT FOOT  Final   Special Requests Normal  Final   Gram Stain   Final    MODERATE WBC PRESENT,BOTH PMN AND MONONUCLEAR NO SQUAMOUS EPITHELIAL CELLS SEEN NO ORGANISMS SEEN Performed at  [facility]    Culture   Final    MODERATE STAPHYLOCOCCUS AUREUS Note: RIFAMPIN AND GENTAMICIN SHOULD NOT BE USED AS SINGLE DRUGS FOR TREATMENT OF STAPH INFECTIONS. Performed at [facility]    Report Status 03/09/2015 FINAL  Final   Organism ID, Bacteria STAPHYLOCOCCUS AUREUS  Final      Susceptibility   Staphylococcus aureus - MIC*    CLINDAMYCIN <=0.25 SENSITIVE Sensitive     ERYTHROMYCIN 0.5 SENSITIVE Sensitive     GENTAMICIN <=0.5 SENSITIVE Sensitive     LEVOFLOXACIN 0.25 SENSITIVE Sensitive     OXACILLIN 0.5 SENSITIVE Sensitive  RIFAMPIN <=0.5 SENSITIVE Sensitive     TRIMETH/SULFA <=10 SENSITIVE Sensitive     VANCOMYCIN 1 SENSITIVE Sensitive     TETRACYCLINE <=1 SENSITIVE Sensitive     MOXIFLOXACIN <=0.25 SENSITIVE Sensitive     * MODERATE STAPHYLOCOCCUS AUREUS  Blood Culture (routine x 2)     Status: None (Preliminary result)   Collection Time: 03/07/15  4:48 AM  Result Value Ref Range Status   Specimen Description BLOOD LEFT FOREARM  Final   Special Requests BOTTLES DRAWN AEROBIC AND ANAEROBIC 5CC  Final   Culture   Final    NO GROWTH 3 DAYS Performed at [facility]    Report Status PENDING  Incomplete  MRSA PCR Screening     Status: None   Collection Time: 03/07/15  6:28 AM  Result Value Ref Range Status   MRSA by PCR NEGATIVE NEGATIVE Final    Comment:        The GeneXpert MRSA Assay (FDA approved for NASAL specimens only), is one component of a comprehensive MRSA colonization surveillance program. It is not intended to diagnose MRSA infection nor to guide or monitor treatment for MRSA infections.     Radiology Reports Dg Abd Acute W/chest  03/07/2015   CLINICAL DATA:  Two week history nausea, vomiting, and diarrhea.  EXAM: DG ABDOMEN ACUTE W/ 1V CHEST  COMPARISON:  Chest 01/05/2014  FINDINGS: Shallow inspiration with linear atelectasis in the lung bases. Heart size and pulmonary vascularity are normal. No focal airspace disease  in the lungs.  Scattered gas and stool in the colon. No small or large bowel distention. No free intra-abdominal air. No abnormal air-fluid levels. Surgical clips in the right upper quadrant. Degenerative changes in the spine. No radiopaque stones.  IMPRESSION: Shallow inspiration with linear atelectasis in the lung bases.  Nonobstructive bowel gas pattern.   Electronically Signed   By: Lucienne Capers M.D.   On: 03/07/2015 06:13   Dg Foot Complete Left  03/07/2015   CLINICAL DATA:  Wound. Obvious wounds and odor to the left foot. History of diabetes.  EXAM: LEFT FOOT - COMPLETE 3+ VIEW  COMPARISON:  12/26/2009  FINDINGS: Since previous study, there has been interval removal of surgical fixation devices. There appear to be a postoperative changes with osteotomies and effusion at the first, second, and possibly third tarsometatarsal joints. Few segments appear intact. No evidence of acute fracture or dislocation. Soft tissue swelling about the first metatarsal-phalangeal joint. There is lucency in the bone at the first metatarsal head, new since prior study. This may indicate evidence of osteomyelitis. No soft tissue gas collections demonstrated. Prominent vascular calcification.  IMPRESSION: Postoperative fusion of the first through third tarsometatarsal joints. Degenerative changes. Soft tissue changes of the first metatarsal phalangeal joint with apparent lucency in the first metatarsal head suggest probable osteomyelitis.   Electronically Signed   By: Lucienne Capers M.D.   On: 03/07/2015 04:28     CBC  Recent Labs Lab 03/07/15 0411 03/07/15 0810 03/08/15 0355 03/11/15 0505  WBC 6.5 7.4 5.8 5.9  HGB 12.9* 12.6* 11.7* 11.4*  HCT 37.6* 37.4* 35.1* 35.9*  PLT 259 274 225 270  MCV 90.0 90.6 92.1 94.0  MCH 30.9 30.5 30.7 29.8  MCHC 34.3 33.7 33.3 31.8  RDW 12.5 12.5 13.0 13.1  LYMPHSABS  --  1.6  --   --   MONOABS  --  0.7  --   --   EOSABS  --  0.0  --   --  BASOSABS  --  0.0  --   --      Chemistries   Recent Labs Lab 03/07/15 0411 03/07/15 0810 03/08/15 0355 03/11/15 0505  NA 133* 133* 135 140  K 3.8 3.5 3.5 3.6  CL 98* 101 102 106  CO2 19* 18* 23 26  GLUCOSE 296* 300* 199* 108*  BUN 13 12 9 10   CREATININE 1.12 1.07 1.05 2.04*  CALCIUM 8.3* 8.3* 7.6* 8.3*  AST 31 21  --  27  ALT 13* 12*  --  12*  ALKPHOS 66 64  --  49  BILITOT 1.0 1.3*  --  0.7   ------------------------------------------------------------------------------------------------------------------ estimated creatinine clearance is 53.9 mL/min (by C-G formula based on Cr of 2.04). ------------------------------------------------------------------------------------------------------------------ No results for input(s): HGBA1C in the last 72 hours. ------------------------------------------------------------------------------------------------------------------ No results for input(s): CHOL, HDL, LDLCALC, TRIG, CHOLHDL, LDLDIRECT in the last 72 hours. ------------------------------------------------------------------------------------------------------------------ No results for input(s): TSH, T4TOTAL, T3FREE, THYROIDAB in the last 72 hours.  Invalid input(s): FREET3 ------------------------------------------------------------------------------------------------------------------ No results for input(s): VITAMINB12, FOLATE, FERRITIN, TIBC, IRON, RETICCTPCT in the last 72 hours.  Coagulation profile  Recent Labs Lab 03/07/15 0810  INR 1.14    No results for input(s): DDIMER in the last 72 hours.  Cardiac Enzymes  Recent Labs Lab 03/07/15 0810  TROPONINI <0.03   ------------------------------------------------------------------------------------------------------------------ Invalid input(s): POCBNP   CBG:  Recent Labs Lab 03/10/15 0748 03/10/15 1148 03/10/15 1632 03/10/15 2058 03/11/15 0730  GLUCAP 127* 110* 120* 135* 115*       Studies: No results found.    Lab  Results  Component Value Date   HGBA1C 7.7* 12/21/2013   HGBA1C 9.2* 03/13/2011   HGBA1C * 07/01/2010    6.7 (NOTE)                                                                       According to the ADA Clinical Practice Recommendations for 2011, when HbA1c is used as a screening test:   >=6.5%   Diagnostic of Diabetes Mellitus           (if abnormal result  is confirmed)  5.7-6.4%   Increased risk of developing Diabetes Mellitus  References:Diagnosis and Classification of Diabetes Mellitus,Diabetes KAJG,8115,72(IOMBT 1):S62-S69 and Standards of Medical Care in         Diabetes - 2011,Diabetes Care,2011,34  (Suppl 1):S11-S61.   Lab Results  Component Value Date   [name]  12/22/2009    53        Total Cholesterol/HDL:CHD Risk Coronary Heart Disease Risk Table                     Men   Women  1/2 Average Risk   3.4   3.3  Average Risk       5.0   4.4  2 X Average Risk   9.6   7.1  3 X Average Risk  23.4   11.0        Use the calculated Patient Ratio above and the CHD Risk Table to determine the patient's CHD Risk.        ATP III CLASSIFICATION (LDL):  <100     mg/dL   Optimal  100-129  mg/dL   Near or Above  Optimal  130-159  mg/dL   Borderline  160-189  mg/dL   High  >190     mg/dL   Very High   CREATININE 2.04* 03/11/2015       Scheduled Meds: . docusate sodium  100 mg Oral BID  . famotidine  20 mg Oral Daily  . ferrous sulfate  325 mg Oral QPM  . gabapentin  300 mg Oral TID  . insulin aspart  0-15 Units Subcutaneous TID WC  . insulin glargine  50 Units Subcutaneous QHS  . linagliptin  5 mg Oral Daily  . metoCLOPramide (REGLAN) injection  10 mg Intravenous 4 times per day  . OxyCODONE  10 mg Oral Q12H  . pantoprazole  20 mg Oral Daily  . pioglitazone  30 mg Oral QHS  . polyethylene glycol  17 g Oral Daily  . senna  1 tablet Oral BID  . simvastatin  60 mg Oral QPM  . sodium chloride  250 mL Intravenous Once   Continuous Infusions:    Principal Problem:   Sepsis Active Problems:   Coronary artery disease   Hyperlipidemia   Osteomyelitis of foot, acute   Diabetes mellitus type 2, uncontrolled   Normocytic normochromic anemia   Nausea & vomiting   Obesity (BMI 30-39.9)   Protein-calorie malnutrition, severe    Time spent: 45 minutes   [facility] Pager [phone]. If 7PM-7AM, please contact night-coverage at [email or web address], password [facility] 03/11/2015, 10:50 AM  LOS: 4 days

## 2015-03-11 NOTE — Clinical Social Work Placement (Signed)
CSW confirmed with Samuel [name] at [facility] that they would be able to take patient over the weekend if ready. Please call weekend CSW, Samuel [name] (ph#: [phone]) to facilitate discharge.      Samuel Maxin, LCSW [facility] Clinical Social Worker cell #: [phone]   CLINICAL SOCIAL WORK PLACEMENT  NOTE  Date:  03/11/2015  Patient Details  Name: Samuel [name] MRN: [id number] Date of Birth: 1950-10-27  Clinical Social Work is seeking post-discharge placement for this patient at the Skilled  Nursing Facility level of care (*CSW will initial, date and re-position this form in  chart as items are completed):  Yes   Patient/family provided with [facility] Clinical Social Work Department's list of facilities offering this level of care within the geographic area requested by the patient (or if unable, by the patient's family).  Yes   Patient/family informed of their freedom to choose among providers that offer the needed level of care, that participate in Medicare, Medicaid or managed care program needed by the patient, have an available bed and are willing to accept the patient.  Yes   Patient/family informed of Max's ownership interest in [facility] and [facility], as well as of the fact that they are under no obligation to receive care at these facilities.  PASRR submitted to EDS on 03/09/15     PASRR number received on 03/09/15     Existing PASRR number confirmed on       FL2 transmitted to all facilities in geographic area requested by pt/family on 03/09/15     FL2 transmitted to all facilities within larger geographic area on       Patient informed that his/her managed care company has contracts with or will negotiate with certain facilities, including the following:        Yes   Patient/family informed of bed offers received.  Patient chooses bed at [facility]     Physician recommends and patient chooses bed at      Patient to be  transferred to [facility] on  .  Patient to be transferred to facility by       Patient family notified on   of transfer.  Name of family member notified:        PHYSICIAN       Additional Comment:    _______________________________________________ Samuel Repress, LCSW 03/11/2015, 2:54 PM

## 2015-03-12 DIAGNOSIS — E1165 Type 2 diabetes mellitus with hyperglycemia: Secondary | ICD-10-CM

## 2015-03-12 DIAGNOSIS — A4101 Sepsis due to Methicillin susceptible Staphylococcus aureus: Secondary | ICD-10-CM

## 2015-03-12 DIAGNOSIS — G43A1 Cyclical vomiting, intractable: Secondary | ICD-10-CM

## 2015-03-12 LAB — COMPREHENSIVE METABOLIC PANEL
ALBUMIN: 2.3 g/dL — AB (ref 3.5–5.0)
ALT: 13 U/L — AB (ref 17–63)
AST: 24 U/L (ref 15–41)
Alkaline Phosphatase: 49 U/L (ref 38–126)
Anion gap: 10 (ref 5–15)
BUN: 11 mg/dL (ref 6–20)
CHLORIDE: 107 mmol/L (ref 101–111)
CO2: 25 mmol/L (ref 22–32)
Calcium: 8.3 mg/dL — ABNORMAL LOW (ref 8.9–10.3)
Creatinine, Ser: 1.96 mg/dL — ABNORMAL HIGH (ref 0.61–1.24)
GFR calc Af Amer: 40 mL/min — ABNORMAL LOW (ref >60)
GFR, EST NON AFRICAN AMERICAN: 35 mL/min — AB (ref >60)
GLUCOSE: 118 mg/dL — AB (ref 65–99)
POTASSIUM: 3.7 mmol/L (ref 3.5–5.1)
SODIUM: 142 mmol/L (ref 135–145)
Total Bilirubin: 0.5 mg/dL (ref 0.3–1.2)
Total Protein: 6.4 g/dL — ABNORMAL LOW (ref 6.5–8.1)

## 2015-03-12 LAB — CBC
HEMATOCRIT: 35.2 % — AB (ref 39.0–52.0)
Hemoglobin: 11.1 g/dL — ABNORMAL LOW (ref 13.0–17.0)
MCH: 29.8 pg (ref 26.0–34.0)
MCHC: 31.5 g/dL (ref 30.0–36.0)
MCV: 94.4 fL (ref 78.0–100.0)
Platelets: 296 10*3/uL (ref 150–400)
RBC: 3.73 MIL/uL — ABNORMAL LOW (ref 4.22–5.81)
RDW: 13.1 % (ref 11.5–15.5)
WBC: 5.4 10*3/uL (ref 4.0–10.5)

## 2015-03-12 LAB — CULTURE, BLOOD (ROUTINE X 2)
CULTURE: NO GROWTH
Culture: NO GROWTH

## 2015-03-12 LAB — GLUCOSE, CAPILLARY
GLUCOSE-CAPILLARY: 154 mg/dL — AB (ref 65–99)
GLUCOSE-CAPILLARY: 150 mg/dL — AB (ref 65–99)
Glucose-Capillary: 109 mg/dL — ABNORMAL HIGH (ref 65–99)
Glucose-Capillary: 105 mg/dL — ABNORMAL HIGH (ref 65–99)

## 2015-03-12 NOTE — Progress Notes (Signed)
[facility] PROGRESS NOTE  LOGIN MUCKLEROY XBD:[id number] DOB: August 09, 1950 DOA: 03/07/2015 PCP: Jani Gravel, MD  Assessment/Plan: Principal Problem:   Sepsis Active Problems:   Coronary artery disease   Hyperlipidemia   Osteomyelitis of foot, acute   Diabetes mellitus type 2, uncontrolled   Normocytic normochromic anemia   Nausea & vomiting   Obesity (BMI 30-39.9)   Protein-calorie malnutrition, severe   Osteomyelitis of foot     History of present illness:  64 year old male with past medical history of obesity and diabetes mellitus as Well as previous osteomyelitis of the left leg admitted on 9/19 for sepsis secondary to large open wound of the left foot with confirmed osteomyelitis under. Patient placed in stepdown unit and started on broad-spectrum antibiotics. He underwent left BKA for the osteomyelitis. Planning for SNF on discharge.   Hospital Course:   :  Sepsis secondary to staph aureus from osteomyelitis of left foot cause behind uncontrolled diabetes mellitus: Patient met criteria for sepsis based on tachycardia, fever, left foot osteomyelitis source. Orthopedic surgery consulted. 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 Wound cultures grew out pansensitive staph aureus, however by postop, patient no longer febrile and with source of infection removed, antibiotics will be discontinued. Blood cultures negative. Postop, seen by PT who recommended short-term skilled nursing. Patient wanted to go to [facility] and he is absolutely refusing to go to [facility] for SNF.     Acute kidney injury cr has doubled since 9/21, hold off on DC , likely prerenal due to poor oral inatke , renal parameters have improved but not at baseline.    Coronary artery disease: Stable   Hyperlipidemia: Stable   Diabetes mellitus type 2, uncontrolled with severe chronic secondary gastroparesis: Patient put on insulin plus sliding scale and gastroparesis  treated with Reglan. CBG (last 3)   Recent Labs  03/11/15 1631 03/11/15 2054 03/12/15 0732  GLUCAP 154* 153* 109*       Normocytic normochromic anemia Stable around 11.    Nausea & vomiting: In part from constipation and gastroparesis. Following Fleet enema, patient will be  discharged on daily MiraLAX for bowel regimen. Constipation has resolve.d but patient reports being slightly nauseated today.   morbid obesity: Patient meets criteria with BMI greater than 30   Disposition: Plan for SNF, possibly to [facility] when bed available, when creatinine improves .   There is a bed available at [facility], but patient is refusing to go to [facility].     Consultants:  [facility]-orthopedic surgery  Procedures:  9/20: Left BKA   DVT prophylaxsis started on Lovenox  Code Status:      Code Status Orders        Start     Ordered   03/08/15 1533  Full code   Continuous     03/08/15 1532     Family Communication: family updated about patient's clinical progress Disposition Plan: anticipate DC tomorrow if bed available at [facility] .        Antibiotics: Anti-infectives    Start     Dose/Rate Route Frequency Ordered Stop   03/08/15 1715  piperacillin-tazobactam (ZOSYN) IVPB 3.375 g     3.375 g 12.5 mL/hr over 240 Minutes Intravenous NOW 03/08/15 1707 03/08/15 2110   03/08/15 1235  vancomycin (VANCOCIN) powder  Status:  Discontinued       As needed 03/08/15 1235 03/08/15 1315   03/07/15 1400  piperacillin-tazobactam (ZOSYN) IVPB  3.375 g  Status:  Discontinued     3.375 g 12.5 mL/hr over 240 Minutes Intravenous 3 times per day 03/07/15 0852 03/09/15 0944   03/07/15 1000  vancomycin (VANCOCIN) IVPB 1000 mg/200 mL premix  Status:  Discontinued     1,000 mg 200 mL/hr over 60 Minutes Intravenous Every 12 hours 03/07/15 0852 03/10/15 0741   03/07/15 0900  vancomycin (VANCOCIN) IVPB 1000 mg/200 mL premix     1,000 mg 200 mL/hr over 60 Minutes Intravenous  Once 03/07/15 0733  03/07/15 0920   03/07/15 0745  piperacillin-tazobactam (ZOSYN) IVPB 3.375 g     3.375 g 100 mL/hr over 30 Minutes Intravenous  Once 03/07/15 0733 03/07/15 0837   03/07/15 0430  clindamycin (CLEOCIN) IVPB 900 mg     900 mg 100 mL/hr over 30 Minutes Intravenous  Once 03/07/15 0424 03/07/15 0534   03/07/15 0430  levofloxacin (LEVAQUIN) IVPB 750 mg     750 mg 100 mL/hr over 90 Minutes Intravenous  Once 03/07/15 0424 03/08/15 1702         HPI/Subjective:  Has concerns about discharging to SNF. Requesting to speak to Education officer, [employer].  Has some nausea, constipation resolved.  Objective: Filed Vitals:   03/11/15 1442 03/11/15 2057 03/12/15 0433 03/12/15 0447  BP: 157/82 152/84  148/69  Pulse: 96 95  89  Temp: 98.2 F (36.8 C) 98.4 F (36.9 C)  97.6 F (36.4 C)  TempSrc: Oral Oral  Oral  Resp: _0 Height:      Weight:   130.1 kg (286 lb 13.1 oz)   SpO2: 97% 96%  95%    Intake/Output Summary (Last 24 hours) at 03/12/15 1056 Last data filed at 03/12/15 1033  Gross per 24 hour  Intake 1625.42 ml  Output   1875 ml  Net -249.58 ml    Exam:  General: Alert and oriented 3, mild distress secondary to nausea  Cardiovascular: regular rate and rhythm, S1-S2  Respiratory: clear to auscultation bilaterally Abdomen: Nontender, nondistended, soft, bowel sounds positive, no rebound, no ascites, no appreciable mass Extremities: No significant cyanosis, clubbing, or edema bilateral lower extremities     Data Review   Micro Results Recent Results (from the past 240 hour(s))  Blood Culture (routine x 2)     Status: None (Preliminary result)   Collection Time: 03/07/15  4:10 AM  Result Value Ref Range Status   Specimen Description BLOOD RIGHT ARM  Final   Special Requests BOTTLES DRAWN AEROBIC AND ANAEROBIC 5CC  Final   Culture   Final    NO GROWTH 4 DAYS Performed at [facility]    Report Status PENDING  Incomplete  Urine culture     Status: None   Collection  Time: 03/07/15  4:36 AM  Result Value Ref Range Status   Specimen Description URINE, CATHETERIZED  Final   Special Requests NONE  Final   Culture   Final    NO GROWTH 1 DAY Performed at [facility]    Report Status 03/08/2015 FINAL  Final  Wound culture     Status: None   Collection Time: 03/07/15  4:46 AM  Result Value Ref Range Status   Specimen Description WOUND LEFT FOOT  Final   Special Requests Normal  Final   Gram Stain   Final    MODERATE WBC PRESENT,BOTH PMN AND MONONUCLEAR NO SQUAMOUS EPITHELIAL CELLS SEEN NO ORGANISMS SEEN Performed at [facility]  Final    MODERATE STAPHYLOCOCCUS AUREUS Note: RIFAMPIN AND GENTAMICIN SHOULD NOT BE USED AS SINGLE DRUGS FOR TREATMENT OF STAPH INFECTIONS. Performed at [facility]    Report Status 03/09/2015 FINAL  Final   Organism ID, Bacteria STAPHYLOCOCCUS AUREUS  Final      Susceptibility   Staphylococcus aureus - MIC*    CLINDAMYCIN <=0.25 SENSITIVE Sensitive     ERYTHROMYCIN 0.5 SENSITIVE Sensitive     GENTAMICIN <=0.5 SENSITIVE Sensitive     LEVOFLOXACIN 0.25 SENSITIVE Sensitive     OXACILLIN 0.5 SENSITIVE Sensitive     RIFAMPIN <=0.5 SENSITIVE Sensitive     TRIMETH/SULFA <=10 SENSITIVE Sensitive     VANCOMYCIN 1 SENSITIVE Sensitive     TETRACYCLINE <=1 SENSITIVE Sensitive     MOXIFLOXACIN <=0.25 SENSITIVE Sensitive     * MODERATE STAPHYLOCOCCUS AUREUS  Blood Culture (routine x 2)     Status: None (Preliminary result)   Collection Time: 03/07/15  4:48 AM  Result Value Ref Range Status   Specimen Description BLOOD LEFT FOREARM  Final   Special Requests BOTTLES DRAWN AEROBIC AND ANAEROBIC 5CC  Final   Culture   Final    NO GROWTH 4 DAYS Performed at [facility]    Report Status PENDING  Incomplete  MRSA PCR Screening     Status: None   Collection Time: 03/07/15  6:28 AM  Result Value Ref Range Status   MRSA by PCR NEGATIVE NEGATIVE Final    Comment:        The GeneXpert  MRSA Assay (FDA approved for NASAL specimens only), is one component of a comprehensive MRSA colonization surveillance program. It is not intended to diagnose MRSA infection nor to guide or monitor treatment for MRSA infections.     Radiology Reports Dg Abd Acute W/chest  03/07/2015   CLINICAL DATA:  Two week history nausea, vomiting, and diarrhea.  EXAM: DG ABDOMEN ACUTE W/ 1V CHEST  COMPARISON:  Chest 01/05/2014  FINDINGS: Shallow inspiration with linear atelectasis in the lung bases. Heart size and pulmonary vascularity are normal. No focal airspace disease in the lungs.  Scattered gas and stool in the colon. No small or large bowel distention. No free intra-abdominal air. No abnormal air-fluid levels. Surgical clips in the right upper quadrant. Degenerative changes in the spine. No radiopaque stones.  IMPRESSION: Shallow inspiration with linear atelectasis in the lung bases.  Nonobstructive bowel gas pattern.   Electronically Signed   By: Lucienne Capers M.D.   On: 03/07/2015 06:13   Dg Foot Complete Left  03/07/2015   CLINICAL DATA:  Wound. Obvious wounds and odor to the left foot. History of diabetes.  EXAM: LEFT FOOT - COMPLETE 3+ VIEW  COMPARISON:  12/26/2009  FINDINGS: Since previous study, there has been interval removal of surgical fixation devices. There appear to be a postoperative changes with osteotomies and effusion at the first, second, and possibly third tarsometatarsal joints. Few segments appear intact. No evidence of acute fracture or dislocation. Soft tissue swelling about the first metatarsal-phalangeal joint. There is lucency in the bone at the first metatarsal head, new since prior study. This may indicate evidence of osteomyelitis. No soft tissue gas collections demonstrated. Prominent vascular calcification.  IMPRESSION: Postoperative fusion of the first through third tarsometatarsal joints. Degenerative changes. Soft tissue changes of the first metatarsal phalangeal  joint with apparent lucency in the first metatarsal head suggest probable osteomyelitis.   Electronically Signed   By: Lucienne Capers M.D.   On:  03/07/2015 04:28     CBC  Recent Labs Lab 03/07/15 0411 03/07/15 0810 03/08/15 0355 03/11/15 0505 03/12/15 0530  WBC 6.5 7.4 5.8 5.9 5.4  HGB 12.9* 12.6* 11.7* 11.4* 11.1*  HCT 37.6* 37.4* 35.1* 35.9* 35.2*  PLT 259 274 225 270 296  MCV 90.0 90.6 92.1 94.0 94.4  MCH 30.9 30.5 30.7 29.8 29.8  MCHC 34.3 33.7 33.3 31.8 31.5  RDW 12.5 12.5 13.0 13.1 13.1  LYMPHSABS  --  1.6  --   --   --   MONOABS  --  0.7  --   --   --   EOSABS  --  0.0  --   --   --   BASOSABS  --  0.0  --   --   --     Chemistries   Recent Labs Lab 03/07/15 0411 03/07/15 0810 03/08/15 0355 03/11/15 0505 03/12/15 0530  NA 133* 133* 135 140 142  K 3.8 3.5 3.5 3.6 3.7  CL 98* 101 102 106 107  CO2 19* 18* _0 GLUCOSE 296* 300* 199* 108* 118*  BUN _1 CREATININE 1.12 1.07 1.05 2.04* 1.96*  CALCIUM 8.3* 8.3* 7.6* 8.3* 8.3*  AST 31 21  --  27 24  ALT 13* 12*  --  12* 13*  ALKPHOS 66 64  --  49 49  BILITOT 1.0 1.3*  --  0.7 0.5   ------------------------------------------------------------------------------------------------------------------ estimated creatinine clearance is 56.8 mL/min (by C-G formula based on Cr of 1.96). ------------------------------------------------------------------------------------------------------------------ No results for input(s): HGBA1C in the last 72 hours. ------------------------------------------------------------------------------------------------------------------ No results for input(s): CHOL, HDL, LDLCALC, TRIG, CHOLHDL, LDLDIRECT in the last 72 hours. ------------------------------------------------------------------------------------------------------------------ No results for input(s): TSH, T4TOTAL, T3FREE, THYROIDAB in the last 72 hours.  Invalid input(s):  FREET3 ------------------------------------------------------------------------------------------------------------------ No results for input(s): VITAMINB12, FOLATE, FERRITIN, TIBC, IRON, RETICCTPCT in the last 72 hours.  Coagulation profile  Recent Labs Lab 03/07/15 0810  INR 1.14    No results for input(s): DDIMER in the last 72 hours.  Cardiac Enzymes  Recent Labs Lab 03/07/15 0810  TROPONINI <0.03   ------------------------------------------------------------------------------------------------------------------ Invalid input(s): POCBNP   CBG:  Recent Labs Lab 03/11/15 0730 03/11/15 1143 03/11/15 1631 03/11/15 2054 03/12/15 0732  GLUCAP 115* 136* 154* 153* 109*       Studies: No results found.    Lab Results  Component Value Date   HGBA1C 7.7* 12/21/2013   HGBA1C 9.2* 03/13/2011   HGBA1C * 07/01/2010    6.7 (NOTE)                                                                       According to the ADA Clinical Practice Recommendations for 2011, when HbA1c is used as a screening test:   >=6.5%   Diagnostic of Diabetes Mellitus           (if abnormal result  is confirmed)  5.7-6.4%   Increased risk of developing Diabetes Mellitus  References:Diagnosis and Classification of Diabetes Mellitus,Diabetes HYIF,0277,41(OINOM 1):S62-S69 and Standards of Medical Care in         Diabetes - 2011,Diabetes Care,2011,34  (Suppl 1):S11-S61.   Lab Results  Component Value Date   [facility]  12/22/2009  53        Total Cholesterol/HDL:CHD Risk Coronary Heart Disease Risk Table                     Men   Women  1/2 Average Risk   3.4   3.3  Average Risk       5.0   4.4  2 X Average Risk   9.6   7.1  3 X Average Risk  23.4   11.0        Use the calculated Patient Ratio above and the CHD Risk Table to determine the patient's CHD Risk.        ATP III CLASSIFICATION (LDL):  <100     mg/dL   Optimal  100-129  mg/dL   Near or Above                    Optimal   130-159  mg/dL   Borderline  160-189  mg/dL   High  >190     mg/dL   Very High   CREATININE 1.96* 03/12/2015       Scheduled Meds: . docusate sodium  100 mg Oral BID  . enoxaparin (LOVENOX) injection  40 mg Subcutaneous Q24H  . famotidine  20 mg Oral Daily  . ferrous sulfate  325 mg Oral QPM  . gabapentin  300 mg Oral TID  . insulin aspart  0-15 Units Subcutaneous TID WC  . insulin glargine  50 Units Subcutaneous QHS  . linagliptin  5 mg Oral Daily  . metoCLOPramide (REGLAN) injection  10 mg Intravenous 4 times per day  . OxyCODONE  10 mg Oral Q12H  . pantoprazole  20 mg Oral Daily  . pioglitazone  30 mg Oral QHS  . polyethylene glycol  17 g Oral Daily  . senna  1 tablet Oral BID  . simvastatin  60 mg Oral QPM   Continuous Infusions:   Principal Problem:   Sepsis Active Problems:   Coronary artery disease   Hyperlipidemia   Osteomyelitis of foot, acute   Diabetes mellitus type 2, uncontrolled   Normocytic normochromic anemia   Nausea & vomiting   Obesity (BMI 30-39.9)   Protein-calorie malnutrition, severe   Osteomyelitis of foot    Time spent: 45 minutes   [facility] Pager [phone] . If 7PM-7AM, please contact night-coverage at [email or web address], password [facility] 03/12/2015, 10:56 AM  LOS: 5 days

## 2015-03-13 DIAGNOSIS — I25118 Atherosclerotic heart disease of native coronary artery with other forms of angina pectoris: Secondary | ICD-10-CM

## 2015-03-13 LAB — GLUCOSE, CAPILLARY
GLUCOSE-CAPILLARY: 134 mg/dL — AB (ref 65–99)
GLUCOSE-CAPILLARY: 130 mg/dL — AB (ref 65–99)
Glucose-Capillary: 115 mg/dL — ABNORMAL HIGH (ref 65–99)
Glucose-Capillary: 119 mg/dL — ABNORMAL HIGH (ref 65–99)
Glucose-Capillary: 124 mg/dL — ABNORMAL HIGH (ref 65–99)
Glucose-Capillary: 131 mg/dL — ABNORMAL HIGH (ref 65–99)
Glucose-Capillary: 140 mg/dL — ABNORMAL HIGH (ref 65–99)

## 2015-03-13 MED ORDER — SODIUM CHLORIDE 0.9 % IV SOLN
INTRAVENOUS | Status: DC
  Administered 2015-03-13 – 2015-03-15 (×5): via INTRAVENOUS

## 2015-03-13 NOTE — Progress Notes (Addendum)
[facility] PROGRESS NOTE  Samuel [name] JYN:[id number] DOB: 1951-03-06 DOA: 03/07/2015 PCP: Jani Gravel, MD  Assessment/Plan: Principal Problem:   Sepsis Active Problems:   Coronary artery disease   Hyperlipidemia   Osteomyelitis of foot, acute   Diabetes mellitus type 2, uncontrolled   Normocytic normochromic anemia   Nausea & vomiting   Obesity (BMI 30-39.9)   Protein-calorie malnutrition, severe   Osteomyelitis of foot     History of present illness:  64 year old male with past medical history of obesity and diabetes mellitus as Well as previous osteomyelitis of the left leg admitted on 9/19 for sepsis secondary to large open wound of the left foot with confirmed osteomyelitis under. Patient placed in stepdown unit and started on broad-spectrum antibiotics.  Hospital Course:   :  Sepsis secondary to staph aureus from osteomyelitis of left foot cause behind uncontrolled diabetes mellitus: Patient met criteria for sepsis based on tachycardia, fever, left foot osteomyelitis source. Orthopedic surgery consulted. 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 Wound cultures grew out pansensitive staph aureus, however by postop, patient no longer febrile and with source of infection removed, antibiotics will be discontinued. Blood cultures negative. Postop, seen by PT who recommended short-term skilled nursing, hopefully discharge tomorrow    Acute kidney injury cr has doubled since 9/21, some  improvement today 2.04>1.96, hold off on DC , likely prerenal due to poor oral inatke , recheck in am if creatinine continues to improve then  DC to SNF in am , social worker aware   Coronary artery disease: Stable   Hyperlipidemia: Stable   Diabetes mellitus type 2, uncontrolled with severe chronic secondary gastroparesis: Patient put on insulin plus sliding scale   Gastroparesis-continue IV Reglan, improving sx , continue constipation regimen     Normocytic normochromic anemia   Nausea & vomiting: In part from constipation and gastroparesis. Following Fleet enema, patient will be  discharged on daily MiraLAX for bowel regimen   morbid obesity: Patient meets criteria with BMI greater than 30    Consultants:  [facility]-orthopedic surgery  Procedures:  9/20: Left BKA   DVT prophylaxsis started on Lovenox  Code Status:      Code Status Orders        Start     Ordered   03/08/15 1533  Full code   Continuous     03/08/15 1532     Family Communication: family updated about patient's clinical progress Disposition Plan: [facility] for [facility] if renal function continues to improve      Antibiotics: Anti-infectives    Start     Dose/Rate Route Frequency Ordered Stop   03/08/15 1715  piperacillin-tazobactam (ZOSYN) IVPB 3.375 g     3.375 g 12.5 mL/hr over 240 Minutes Intravenous NOW 03/08/15 1707 03/08/15 2110   03/08/15 1235  vancomycin (VANCOCIN) powder  Status:  Discontinued       As needed 03/08/15 1235 03/08/15 1315   03/07/15 1400  piperacillin-tazobactam (ZOSYN) IVPB 3.375 g  Status:  Discontinued     3.375 g 12.5 mL/hr over 240 Minutes Intravenous 3 times per day 03/07/15 0852 03/09/15 0944   03/07/15 1000  vancomycin (VANCOCIN) IVPB 1000 mg/200 mL premix  Status:  Discontinued     1,000 mg 200 mL/hr over 60 Minutes Intravenous Every 12 hours 03/07/15 0852 03/10/15 0741   03/07/15 0900  vancomycin (VANCOCIN) IVPB 1000 mg/200 mL premix     1,000 mg 200 mL/hr over  60 Minutes Intravenous  Once 03/07/15 0733 03/07/15 0920   03/07/15 0745  piperacillin-tazobactam (ZOSYN) IVPB 3.375 g     3.375 g 100 mL/hr over 30 Minutes Intravenous  Once 03/07/15 0733 03/07/15 0837   03/07/15 0430  clindamycin (CLEOCIN) IVPB 900 mg     900 mg 100 mL/hr over 30 Minutes Intravenous  Once 03/07/15 0424 03/07/15 0534   03/07/15 0430  levofloxacin (LEVAQUIN) IVPB 750 mg     750 mg 100 mL/hr over 90 Minutes Intravenous  Once 03/07/15  0424 03/08/15 1702         HPI/Subjective: Nausea is better ,he is able to eat his meals , had BM this am   Objective: Filed Vitals:   03/13/15 0259 03/13/15 0313 03/13/15 0444 03/13/15 0447  BP: 155/66 151/63 161/68   Pulse: 86 85 85   Temp: 97.9 F (36.6 C)  98.4 F (36.9 C)   TempSrc: Oral  Oral   Resp: 18 18    Height:      Weight:    130.1 kg (286 lb 13.1 oz)  SpO2: 97% 98% 97%     Intake/Output Summary (Last 24 hours) at 03/13/15 1057 Last data filed at 03/13/15 0900  Gross per 24 hour  Intake    360 ml  Output   1175 ml  Net   -815 ml    Exam:  General: Alert and oriented 3, mild distress secondary to nausea  Cardiovascular: regular rate and rhythm, S1-S2  Respiratory: clear to auscultation bilaterally Abdomen: Nontender, nondistended, soft, bowel sounds positive, no rebound, no ascites, no appreciable mass Extremities: No significant cyanosis, clubbing, or edema bilateral lower extremities     Data Review   Micro Results Recent Results (from the past 240 hour(s))  Blood Culture (routine x 2)     Status: None   Collection Time: 03/07/15  4:10 AM  Result Value Ref Range Status   Specimen Description BLOOD RIGHT ARM  Final   Special Requests BOTTLES DRAWN AEROBIC AND ANAEROBIC 5CC  Final   Culture   Final    NO GROWTH 5 DAYS Performed at [facility]    Report Status 03/12/2015 FINAL  Final  Urine culture     Status: None   Collection Time: 03/07/15  4:36 AM  Result Value Ref Range Status   Specimen Description URINE, CATHETERIZED  Final   Special Requests NONE  Final   Culture   Final    NO GROWTH 1 DAY Performed at [facility]    Report Status 03/08/2015 FINAL  Final  Wound culture     Status: None   Collection Time: 03/07/15  4:46 AM  Result Value Ref Range Status   Specimen Description WOUND LEFT FOOT  Final   Special Requests Normal  Final   Gram Stain   Final    MODERATE WBC PRESENT,BOTH PMN AND MONONUCLEAR NO  SQUAMOUS EPITHELIAL CELLS SEEN NO ORGANISMS SEEN Performed at [facility]    Culture   Final    MODERATE STAPHYLOCOCCUS AUREUS Note: RIFAMPIN AND GENTAMICIN SHOULD NOT BE USED AS SINGLE DRUGS FOR TREATMENT OF STAPH INFECTIONS. Performed at [facility]    Report Status 03/09/2015 FINAL  Final   Organism ID, Bacteria STAPHYLOCOCCUS AUREUS  Final      Susceptibility   Staphylococcus aureus - MIC*    CLINDAMYCIN <=0.25 SENSITIVE Sensitive     ERYTHROMYCIN 0.5 SENSITIVE Sensitive     GENTAMICIN <=0.5 SENSITIVE Sensitive  LEVOFLOXACIN 0.25 SENSITIVE Sensitive     OXACILLIN 0.5 SENSITIVE Sensitive     RIFAMPIN <=0.5 SENSITIVE Sensitive     TRIMETH/SULFA <=10 SENSITIVE Sensitive     VANCOMYCIN 1 SENSITIVE Sensitive     TETRACYCLINE <=1 SENSITIVE Sensitive     MOXIFLOXACIN <=0.25 SENSITIVE Sensitive     * MODERATE STAPHYLOCOCCUS AUREUS  Blood Culture (routine x 2)     Status: None   Collection Time: 03/07/15  4:48 AM  Result Value Ref Range Status   Specimen Description BLOOD LEFT FOREARM  Final   Special Requests BOTTLES DRAWN AEROBIC AND ANAEROBIC 5CC  Final   Culture   Final    NO GROWTH 5 DAYS Performed at [facility]    Report Status 03/12/2015 FINAL  Final  MRSA PCR Screening     Status: None   Collection Time: 03/07/15  6:28 AM  Result Value Ref Range Status   MRSA by PCR NEGATIVE NEGATIVE Final    Comment:        The GeneXpert MRSA Assay (FDA approved for NASAL specimens only), is one component of a comprehensive MRSA colonization surveillance program. It is not intended to diagnose MRSA infection nor to guide or monitor treatment for MRSA infections.     Radiology Reports Dg Abd Acute W/chest  03/07/2015   CLINICAL DATA:  Two week history nausea, vomiting, and diarrhea.  EXAM: DG ABDOMEN ACUTE W/ 1V CHEST  COMPARISON:  Chest 01/05/2014  FINDINGS: Shallow inspiration with linear atelectasis in the lung bases. Heart size and pulmonary  vascularity are normal. No focal airspace disease in the lungs.  Scattered gas and stool in the colon. No small or large bowel distention. No free intra-abdominal air. No abnormal air-fluid levels. Surgical clips in the right upper quadrant. Degenerative changes in the spine. No radiopaque stones.  IMPRESSION: Shallow inspiration with linear atelectasis in the lung bases.  Nonobstructive bowel gas pattern.   Electronically Signed   By: Lucienne Capers M.D.   On: 03/07/2015 06:13   Dg Foot Complete Left  03/07/2015   CLINICAL DATA:  Wound. Obvious wounds and odor to the left foot. History of diabetes.  EXAM: LEFT FOOT - COMPLETE 3+ VIEW  COMPARISON:  12/26/2009  FINDINGS: Since previous study, there has been interval removal of surgical fixation devices. There appear to be a postoperative changes with osteotomies and effusion at the first, second, and possibly third tarsometatarsal joints. Few segments appear intact. No evidence of acute fracture or dislocation. Soft tissue swelling about the first metatarsal-phalangeal joint. There is lucency in the bone at the first metatarsal head, new since prior study. This may indicate evidence of osteomyelitis. No soft tissue gas collections demonstrated. Prominent vascular calcification.  IMPRESSION: Postoperative fusion of the first through third tarsometatarsal joints. Degenerative changes. Soft tissue changes of the first metatarsal phalangeal joint with apparent lucency in the first metatarsal head suggest probable osteomyelitis.   Electronically Signed   By: Lucienne Capers M.D.   On: 03/07/2015 04:28     CBC  Recent Labs Lab 03/07/15 0411 03/07/15 0810 03/08/15 0355 03/11/15 0505 03/12/15 0530  WBC 6.5 7.4 5.8 5.9 5.4  HGB 12.9* 12.6* 11.7* 11.4* 11.1*  HCT 37.6* 37.4* 35.1* 35.9* 35.2*  PLT 259 274 225 270 296  MCV 90.0 90.6 92.1 94.0 94.4  MCH 30.9 30.5 30.7 29.8 29.8  MCHC 34.3 33.7 33.3 31.8 31.5  RDW 12.5 12.5 13.0 13.1 13.1  LYMPHSABS  --   1.6  --   --   --  MONOABS  --  0.7  --   --   --   EOSABS  --  0.0  --   --   --   BASOSABS  --  0.0  --   --   --     Chemistries   Recent Labs Lab 03/07/15 0411 03/07/15 0810 03/08/15 0355 03/11/15 0505 03/12/15 0530  NA 133* 133* 135 140 142  K 3.8 3.5 3.5 3.6 3.7  CL 98* 101 102 106 107  CO2 19* 18* 23 26 25   GLUCOSE 296* 300* 199* 108* 118*  BUN 13 12 9 10 11   CREATININE 1.12 1.07 1.05 2.04* 1.96*  CALCIUM 8.3* 8.3* 7.6* 8.3* 8.3*  AST 31 21  --  27 24  ALT 13* 12*  --  12* 13*  ALKPHOS 66 64  --  49 49  BILITOT 1.0 1.3*  --  0.7 0.5   ------------------------------------------------------------------------------------------------------------------ estimated creatinine clearance is 56.8 mL/min (by C-G formula based on Cr of 1.96). ------------------------------------------------------------------------------------------------------------------ No results for input(s): HGBA1C in the last 72 hours. ------------------------------------------------------------------------------------------------------------------ No results for input(s): CHOL, HDL, LDLCALC, TRIG, CHOLHDL, LDLDIRECT in the last 72 hours. ------------------------------------------------------------------------------------------------------------------ No results for input(s): TSH, T4TOTAL, T3FREE, THYROIDAB in the last 72 hours.  Invalid input(s): FREET3 ------------------------------------------------------------------------------------------------------------------ No results for input(s): VITAMINB12, FOLATE, FERRITIN, TIBC, IRON, RETICCTPCT in the last 72 hours.  Coagulation profile  Recent Labs Lab 03/07/15 0810  INR 1.14    No results for input(s): DDIMER in the last 72 hours.  Cardiac Enzymes  Recent Labs Lab 03/07/15 0810  TROPONINI <0.03   ------------------------------------------------------------------------------------------------------------------ Invalid input(s):  POCBNP   CBG:  Recent Labs Lab 03/12/15 1634 03/12/15 2017 03/13/15 0256 03/13/15 0308 03/13/15 0741  GLUCAP 154* 150* 119* 115* 124*       Studies: No results found.    Lab Results  Component Value Date   HGBA1C 7.7* 12/21/2013   HGBA1C 9.2* 03/13/2011   HGBA1C * 07/01/2010    6.7 (NOTE)                                                                       According to the ADA Clinical Practice Recommendations for 2011, when HbA1c is used as a screening test:   >=6.5%   Diagnostic of Diabetes Mellitus           (if abnormal result  is confirmed)  5.7-6.4%   Increased risk of developing Diabetes Mellitus  References:Diagnosis and Classification of Diabetes Mellitus,Diabetes PJSR,1594,58(PFYTW 1):S62-S69 and Standards of Medical Care in         Diabetes - 2011,Diabetes Care,2011,34  (Suppl 1):S11-S61.   Lab Results  Component Value Date   [facility]  12/22/2009    53        Total Cholesterol/HDL:CHD Risk Coronary Heart Disease Risk Table                     Men   Women  1/2 Average Risk   3.4   3.3  Average Risk       5.0   4.4  2 X Average Risk   9.6   7.1  3 X Average Risk  23.4   11.0        Use  the calculated Patient Ratio above and the CHD Risk Table to determine the patient's CHD Risk.        ATP III CLASSIFICATION (LDL):  <100     mg/dL   Optimal  100-129  mg/dL   Near or Above                    Optimal  130-159  mg/dL   Borderline  160-189  mg/dL   High  >190     mg/dL   Very High   CREATININE 1.96* 03/12/2015       Scheduled Meds: . docusate sodium  100 mg Oral BID  . enoxaparin (LOVENOX) injection  40 mg Subcutaneous Q24H  . famotidine  20 mg Oral Daily  . ferrous sulfate  325 mg Oral QPM  . gabapentin  300 mg Oral TID  . insulin aspart  0-15 Units Subcutaneous TID WC  . insulin glargine  50 Units Subcutaneous QHS  . linagliptin  5 mg Oral Daily  . metoCLOPramide (REGLAN) injection  10 mg Intravenous 4 times per day  . OxyCODONE  10 mg  Oral Q12H  . pantoprazole  20 mg Oral Daily  . pioglitazone  30 mg Oral QHS  . polyethylene glycol  17 g Oral Daily  . senna  1 tablet Oral BID  . simvastatin  60 mg Oral QPM   Continuous Infusions:   Principal Problem:   Sepsis Active Problems:   Coronary artery disease   Hyperlipidemia   Osteomyelitis of foot, acute   Diabetes mellitus type 2, uncontrolled   Normocytic normochromic anemia   Nausea & vomiting   Obesity (BMI 30-39.9)   Protein-calorie malnutrition, severe   Osteomyelitis of foot    Time spent: 45 minutes   [facility] Pager [phone]. If 7PM-7AM, please contact night-coverage at [email or web address], password [facility] 03/13/2015, 10:57 AM  LOS: 6 days

## 2015-03-13 NOTE — Clinical Social Work Note (Signed)
CSW spoke with MD.  Per MD pt is not ready for discharge today and may be ready tomorrow  CSW called and spoke with [facility] letting them know that pt will not be discharging today but possibly tomorrow.  They agreed to extend authorization tomorrow if pt is ready to discharge to their facility  .Elray Buba, LCSW [facility] Clinical Social Worker - Weekend Coverage cell #: [phone]

## 2015-03-13 NOTE — Clinical Social Work Note (Signed)
CSW met with pt to clarify SNF bed availability.  Pt was under assumption that [facility] had a bed so CSW followed up with [facility] and pt's brother to gather information.  Per [facility] they were unable to accommodate due to pt needs.  [facility] does have a bed and have agreed to obtain the authozation for [facility] that expired yesterday  Pt and brother aware and on board with [facility] for rehab today if pt discharges  CSW texted MD to let her know that pt has a bed today and on board with facility.  [facility] called back to stated that they were able to extend authorization and will accept pt today if MD discharges  Charge nurse also notified that pt has SNF/bed  .Dede Query, LCSW [facility] Clinical Social Worker - Weekend Coverage cell #: [phone]

## 2015-03-14 DIAGNOSIS — N179 Acute kidney failure, unspecified: Secondary | ICD-10-CM

## 2015-03-14 LAB — URINALYSIS, ROUTINE W REFLEX MICROSCOPIC
Bilirubin Urine: NEGATIVE
Glucose, UA: 100 mg/dL — AB
KETONES UR: NEGATIVE mg/dL
Leukocytes, UA: NEGATIVE
NITRITE: NEGATIVE
Protein, ur: NEGATIVE mg/dL
SPECIFIC GRAVITY, URINE: 1.008 (ref 1.005–1.030)
UROBILINOGEN UA: 0.2 mg/dL (ref 0.0–1.0)
pH: 5.5 (ref 5.0–8.0)

## 2015-03-14 LAB — COMPREHENSIVE METABOLIC PANEL
ALBUMIN: 2.4 g/dL — AB (ref 3.5–5.0)
ALT: 11 U/L — ABNORMAL LOW (ref 17–63)
ANION GAP: 7 (ref 5–15)
AST: 20 U/L (ref 15–41)
Alkaline Phosphatase: 46 U/L (ref 38–126)
BILIRUBIN TOTAL: 0.2 mg/dL — AB (ref 0.3–1.2)
BUN: 10 mg/dL (ref 6–20)
CHLORIDE: 106 mmol/L (ref 101–111)
CO2: 29 mmol/L (ref 22–32)
Calcium: 8.6 mg/dL — ABNORMAL LOW (ref 8.9–10.3)
Creatinine, Ser: 1.94 mg/dL — ABNORMAL HIGH (ref 0.61–1.24)
GFR calc Af Amer: 41 mL/min — ABNORMAL LOW (ref >60)
GFR calc non Af Amer: 35 mL/min — ABNORMAL LOW (ref >60)
GLUCOSE: 132 mg/dL — AB (ref 65–99)
POTASSIUM: 3.1 mmol/L — AB (ref 3.5–5.1)
SODIUM: 142 mmol/L (ref 135–145)
TOTAL PROTEIN: 6.6 g/dL (ref 6.5–8.1)

## 2015-03-14 LAB — CBC
HEMATOCRIT: 34.7 % — AB (ref 39.0–52.0)
Hemoglobin: 10.9 g/dL — ABNORMAL LOW (ref 13.0–17.0)
MCH: 29.5 pg (ref 26.0–34.0)
MCHC: 31.4 g/dL (ref 30.0–36.0)
MCV: 94 fL (ref 78.0–100.0)
PLATELETS: 309 10*3/uL (ref 150–400)
RBC: 3.69 MIL/uL — ABNORMAL LOW (ref 4.22–5.81)
RDW: 13.2 % (ref 11.5–15.5)
WBC: 4.7 10*3/uL (ref 4.0–10.5)

## 2015-03-14 LAB — URINE MICROSCOPIC-ADD ON

## 2015-03-14 LAB — GLUCOSE, CAPILLARY
Glucose-Capillary: 128 mg/dL — ABNORMAL HIGH (ref 65–99)
Glucose-Capillary: 124 mg/dL — ABNORMAL HIGH (ref 65–99)
Glucose-Capillary: 118 mg/dL — ABNORMAL HIGH (ref 65–99)
Glucose-Capillary: 136 mg/dL — ABNORMAL HIGH (ref 65–99)

## 2015-03-14 MED ORDER — LACTULOSE 10 GM/15ML PO SOLN
30.0000 g | ORAL | Status: AC
  Administered 2015-03-14: 30 g via ORAL
  Filled 2015-03-14: qty 45

## 2015-03-14 MED ORDER — BOOST / RESOURCE BREEZE PO LIQD
1.0000 | Freq: Two times a day (BID) | ORAL | Status: DC
  Administered 2015-03-15: 1 via ORAL

## 2015-03-14 NOTE — Care Management Note (Signed)
Case Management Note  Patient Details  Name: RAMIR MALERBA MRN: [id number] Date of Birth: March 12, 1951  Subjective/Objective:  AKI,gastroparesis,hypotension,ivf,iv antiemetics,ins,labs.From home.PT-SNF. CSW following.                Action/Plan:d/c SNF   Expected Discharge Date:   (unknown)               Expected Discharge Plan:  Skilled Nursing Facility  In-House Referral:  Clinical Social Work  Discharge planning Services  CM Consult  Post Acute Care Choice:  NA Choice offered to:  NA  DME Arranged:    DME Agency:     HH Arranged:    HH Agency:     Status of Service:  In process, will continue to follow  Medicare Important Message Given:  Yes-third notification given Date Medicare IM Given:    Medicare IM give by:    Date Additional Medicare IM Given:    Additional Medicare Important Message give by:     If discussed at Long Length of Stay Meetings, dates discussed:    Additional Comments:  Lanier Clam, RN 03/14/2015, 6:43 PM

## 2015-03-14 NOTE — Progress Notes (Signed)
Nutrition Follow-up  DOCUMENTATION CODES:   Severe malnutrition in context of acute illness/injury, Obesity unspecified  INTERVENTION:  - Will order Boost Breeze BID, each supplement provides 250 kcal and 9 grams of protein - RD will continue to monitor for needs  NUTRITION DIAGNOSIS:   Inadequate oral intake related to nausea as evidenced by per patient/family report. -ongoing recently  GOAL:   Patient will meet greater than or equal to 90% of their needs -variably met  MONITOR:   PO intake, Supplement acceptance, Weight trends, Labs, I & O's  ASSESSMENT:   64 y.o. male history of diabetes mellitus type 2, CAD status post stenting, hyperlipidemia, previous history of PE status post IVC filter placement presents to the ER because of persistent nausea vomiting or the last few weeks. He also has been having increasing discharge from his left foot wound. In the ER patient was found to be febrile and acute abdominal series was unremarkable. Abdomen appears benign on exam. On exam patient's wound on his left foot has active discharge. Patient states his blood sugar has been persistently running high but he has been compliant with his medications. Over the last 2 days he has missed his insulin dose because of his nausea vomiting which has further worsened. Patient otherwise denies any chest pain or shortness of breath. Denies any productive cough. Has had initially some diarrhea which has stopped at this time. X-rays revealed possible osteomyelitis involving the left foot first metatarsal head.  9/26 Pt's diet advanced from NPO to Carb Modified at 1533 on 9/20 and to Low Na, Heart Healthy at midnight on 9/23. Per chart review, pt ate 40% breakfast, 0% lunch, and 30% dinner 9/23; 100% lunch and dinner 9/24, and 100% breakfast and 75% lunch and dinner yesterday.  He states he has been unable to eat this AM due to nausea. He states nausea is constant and he feels it is related to gastroparesis.  Pt states that PO intakes make nausea worse.  He states when he is able to eat appetite has been good. Will order Boost Breeze in case pt is able to sip on CLD-type fluids when unable to eat solid foods with nausea.   Variably meeting needs. Medications reviewed. Labs reviewed; CBGs: 105-153 mg/dL, K: 3.1 mmol/L, creatinine elevated, Ca: 8.6 mg/dL, GFR: 35.    9/20 - He reports that he began having N/V/D 2-3 weeks ago.  - During that time his PO intakes decreased and he has not had solid food for the past 1 week.  - He last had something to drink on the night of 9/18 and his last episode of emesis was this day as well.  - Since 9/18, pt has been dry heaving with no emesis resulting.  - He states UBW of 307 lbs and is unsure of amount of weight he may have lost but feels he has lost weight in the past few weeks. Chart review indicates that based on UBW, pt has lost 24 lbs (8% body weight) in the past 2-3 weeks which is significant for time frame.   Diet Order:  Diet Carb Modified Fluid consistency:: Thin; Room service appropriate?: Yes Diet - low sodium heart healthy  Skin:  Wound (see comment) (L foot DM ulcer)  Last BM:  9/23  Height:   Ht Readings from Last 1 Encounters:  03/07/15 6' 3" (1.905 m)    Weight:   Wt Readings from Last 1 Encounters:  03/14/15 286 lb 6 oz (129.9 kg)  Ideal Body Weight:  89.09 kg (kg)  BMI:  Body mass index is 35.79 kg/(m^2).  Estimated Nutritional Needs:   Kcal:  6295-2841  Protein:  90-100 grams  Fluid:  >/= 2.5 L/day  EDUCATION NEEDS:   No education needs identified at this time      Jarome Matin, RD, LDN Inpatient Clinical Dietitian Pager # [phone] After hours/weekend pager # [phone]

## 2015-03-14 NOTE — Care Management Important Message (Signed)
Important Message  Patient Details  Name: Samuel [name] MRN: [id number] Date of Birth: 12-27-1950   Medicare Important Message Given:  Yes-third notification given    Haskell Flirt 03/14/2015, 11:37 AMImportant Message  Patient Details  Name: Samuel [name] MRN: [id number] Date of Birth: [birth year]   Medicare Important Message Given:  Yes-third notification given    Haskell Flirt 03/14/2015, 11:37 AM

## 2015-03-14 NOTE — Progress Notes (Signed)
PROGRESS NOTE  Samuel [name] ZOX:[id number] DOB: 01-10-1951 DOA: 03/07/2015 PCP: [name], MD  HPI/Recap of past 52 hours: 64 year old male with past medical history of obesity and diabetes mellitus as Well as previous osteomyelitis of the left leg admitted on 9/19 for sepsis secondary to large open wound of the left foot with confirmed osteomyelitis under. Patient placed in stepdown unit and started on broad-spectrum antibiotics. Orthopedic surgery who took him for a BKA on 9/20.  Postop, course complicated by nausea brought on by constipation and diuresis. Patient had a bowel movement on Thursday 9/22 after getting a Fleet enema. He has not had a bowel movement since. His creatinine has checked on 9/20 was at baseline normal, and rechecked on 9/23 and found to be elevated at 2.04 with a GFR of 39.  Patient self doing okay. Some mild nausea, no bowel movement since Thursday. Irritable because he is not yet at skilled nursing  Assessment/Plan: Principal Problem:   Sepsis secondary to staph aureus secondary to acute osteomyelitis of left foot: Patient meets criteria given tachypnea, tachycardia and lactic acid level. Status post left BKA.  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 Pain overall controlled with extended release OxyContin. Antibiotics able to be discontinued following surgery. Active Problems:   Coronary artery disease: Stable   Hyperlipidemia  Acute kidney injury: Unclear etiology: May have been from hypotension from dehydration.  Patient has not lost that much blood since surgery.  Continue fluid hydration. Recheck labs in the morning.     Diabetes mellitus type 2, uncontrolled with severe chronic secondary gastroparesis: IV scheduled Reglan, insulin and sliding scale. CBGs have stayed well controlled under 150   Normocytic normochromic anemia: From chronic disease.   Nausea & vomiting: Secondary to infection plus antibiotics plus  gastroparesis plus pain plus constipation. See above.   Obesity (BMI 30-39.9): Patient meets criteria with BMI greater than 30  Constipation: A chronic problem from gastroparesis now made worse with pain medications. Responded to Fleet enema. Patient on daily MiraLAX and Colace. Have added lactulose today.  Code Status: DNR  Family Communication: Spoke with brother by phone  Disposition Plan: Stable For short-term skilled nursing at end of week   Consultants:  [facility]-orthopedic surgery  Procedures:  9/20: Left BKA  Antibiotics:  IV Zosyn 9/19-9/21 IV vancomycin 9/19-present IV clindamycin 9/191 dose IV Levaquin 9/191 dose  Objective: BP 166/76 mmHg  Pulse 85  Temp(Src) 98.2 F (36.8 C) (Oral)  Resp 20  Ht  (1.905 m)  Wt 129.9 kg (286 lb 6 oz)  BMI 35.79 kg/m2  SpO2 95%  Intake/Output Summary (Last 24 hours) at 03/14/15 1510 Last data filed at 03/14/15 1300  Gross per 24 hour  Intake   1440 ml  Output   1250 ml  Net    190 ml   Filed Weights   03/12/15 0433 03/13/15 0447 03/14/15 0518  Weight: 130.1 kg (286 lb 13.1 oz) 130.1 kg (286 lb 13.1 oz) 129.9 kg (286 lb 6 oz)    Exam:   General:  Alert & oriented x 3, irritated because he is not yet at skilled nursing  Cardiovascular: regular rate and rhythm, S1-S2  Respiratory: clear to auscultation bilaterally, but decreased secondary to body habitus  Abdomen: soft, nontender, nondistended, few bowel sounds  Musculoskeletal: Status post left BKA. Leg in soft cast  Data Reviewed: Basic Metabolic Panel:  Recent Labs Lab 03/08/15 0355 03/11/15 0505 03/12/15 0530 03/14/15 8119  NA 135 140 142 142  K 3.5 3.6 3.7 3.1*  CL 102 106 107 106  CO2 GLUCOSE 199* 108* 118* 132*  BUN CREATININE 1.05 2.04* 1.96* 1.94*  CALCIUM 7.6* 8.3* 8.3* 8.6*   Liver Function Tests:  Recent Labs Lab 03/11/15 0505 03/12/15 0530 03/14/15 0435  AST ALT 12* 13* 11*  ALKPHOS 49  49 46  BILITOT 0.7 0.5 0.2*  PROT 6.4* 6.4* 6.6  ALBUMIN 2.2* 2.3* 2.4*   No results for input(s): LIPASE, AMYLASE in the last 168 hours. No results for input(s): AMMONIA in the last 168 hours. CBC:  Recent Labs Lab 03/08/15 0355 03/11/15 0505 03/12/15 0530 03/14/15 0435  WBC 5.8 5.9 5.4 4.7  HGB 11.7* 11.4* 11.1* 10.9*  HCT 35.1* 35.9* 35.2* 34.7*  MCV 92.1 94.0 94.4 94.0  PLT 225 270 296 309   Cardiac Enzymes:   No results for input(s): CKTOTAL, CKMB, CKMBINDEX, TROPONINI in the last 168 hours. BNP (last 3 results) No results for input(s): BNP in the last 8760 hours.  ProBNP (last 3 results) No results for input(s): PROBNP in the last 8760 hours.  CBG:  Recent Labs Lab 03/13/15 1604 03/13/15 2118 03/13/15 2203 03/14/15 0755 03/14/15 1212  GLUCAP 140* 134* 130* 128* 124*    Recent Results (from the past 240 hour(s))  Blood Culture (routine x 2)     Status: None   Collection Time: 03/07/15  4:10 AM  Result Value Ref Range Status   Specimen Description BLOOD RIGHT ARM  Final   Special Requests BOTTLES DRAWN AEROBIC AND ANAEROBIC 5CC  Final   Culture   Final    NO GROWTH 5 DAYS Performed at [facility]    Report Status 03/12/2015 FINAL  Final  Urine culture     Status: None   Collection Time: 03/07/15  4:36 AM  Result Value Ref Range Status   Specimen Description URINE, CATHETERIZED  Final   Special Requests NONE  Final   Culture   Final    NO GROWTH 1 DAY Performed at [facility]    Report Status 03/08/2015 FINAL  Final  Wound culture     Status: None   Collection Time: 03/07/15  4:46 AM  Result Value Ref Range Status   Specimen Description WOUND LEFT FOOT  Final   Special Requests Normal  Final   Gram Stain   Final    MODERATE WBC PRESENT,BOTH PMN AND MONONUCLEAR NO SQUAMOUS EPITHELIAL CELLS SEEN NO ORGANISMS SEEN Performed at [facility]    Culture   Final    MODERATE STAPHYLOCOCCUS AUREUS Note: RIFAMPIN AND  GENTAMICIN SHOULD NOT BE USED AS SINGLE DRUGS FOR TREATMENT OF STAPH INFECTIONS. Performed at [facility]    Report Status 03/09/2015 FINAL  Final   Organism ID, Bacteria STAPHYLOCOCCUS AUREUS  Final      Susceptibility   Staphylococcus aureus - MIC*    CLINDAMYCIN <=0.25 SENSITIVE Sensitive     ERYTHROMYCIN 0.5 SENSITIVE Sensitive     GENTAMICIN <=0.5 SENSITIVE Sensitive     LEVOFLOXACIN 0.25 SENSITIVE Sensitive     OXACILLIN 0.5 SENSITIVE Sensitive     RIFAMPIN <=0.5 SENSITIVE Sensitive     TRIMETH/SULFA <=10 SENSITIVE Sensitive     VANCOMYCIN 1 SENSITIVE Sensitive     TETRACYCLINE <=1 SENSITIVE Sensitive     MOXIFLOXACIN <=0.25 SENSITIVE Sensitive     * MODERATE STAPHYLOCOCCUS AUREUS  Blood Culture (routine x 2)     Status: None   Collection Time: 03/07/15  4:48 AM  Result Value Ref Range Status   Specimen Description BLOOD LEFT FOREARM  Final   Special Requests BOTTLES DRAWN AEROBIC AND ANAEROBIC 5CC  Final   Culture   Final    NO GROWTH 5 DAYS Performed at [facility]    Report Status 03/12/2015 FINAL  Final  MRSA PCR Screening     Status: None   Collection Time: 03/07/15  6:28 AM  Result Value Ref Range Status   MRSA by PCR NEGATIVE NEGATIVE Final    Comment:        The GeneXpert MRSA Assay (FDA approved for NASAL specimens only), is one component of a comprehensive MRSA colonization surveillance program. It is not intended to diagnose MRSA infection nor to guide or monitor treatment for MRSA infections.      Studies: No results found.  Scheduled Meds: . docusate sodium  100 mg Oral BID  . enoxaparin (LOVENOX) injection  40 mg Subcutaneous Q24H  . famotidine  20 mg Oral Daily  . feeding supplement  1 Container Oral BID BM  . ferrous sulfate  325 mg Oral QPM  . gabapentin  300 mg Oral TID  . insulin aspart  0-15 Units Subcutaneous TID WC  . insulin glargine  50 Units Subcutaneous QHS  . linagliptin  5 mg Oral Daily  . metoCLOPramide  (REGLAN) injection  10 mg Intravenous 4 times per day  . OxyCODONE  10 mg Oral Q12H  . pantoprazole  20 mg Oral Daily  . pioglitazone  30 mg Oral QHS  . polyethylene glycol  17 g Oral Daily  . senna  1 tablet Oral BID  . simvastatin  60 mg Oral QPM    Continuous Infusions: . sodium chloride 75 mL/hr at 03/14/15 1441     Time spent: 25 minutes  Hollice Espy  [facility] Pager [phone]. If 7PM-7AM, please contact night-coverage at [email or web address], password [facility] 03/14/2015, 3:10 PM  LOS: 7 days

## 2015-03-14 NOTE — Progress Notes (Signed)
Physical Therapy Treatment Patient Details Name: Samuel [name] MRN: [id number] DOB: 1951-04-16 Today's Date: 03/14/2015    History of Present Illness 64 yo male admitted with sepsis. 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 Hx of DM, peripheral neuropathy    PT Comments    Pt c/o nausea/ABD bloating.  Assisted from supine to EOB with mild c/o feeling "woozy".  Allowed increased time to sit EOB in which symptoms decreased.  Assisted pt with standing + 2 assist for safety and use of a walker.  Pt was able to self rise and pivot 1/4 turn to recliner by scooting/wiggling R foot.  Then pt was able to static stand x 60 sec before c/o fatigue.  Assisted to recliner and positioned with pillows.    Follow Up Recommendations  SNF     Equipment Recommendations       Recommendations for Other Services       Precautions / Restrictions Precautions Precautions: Fall Precaution Comments: L BKA Restrictions Weight Bearing Restrictions: Yes LLE Weight Bearing: Non weight bearing Other Position/Activity Restrictions: L LE     Mobility  Bed Mobility Overal bed mobility: Needs Assistance Bed Mobility: Supine to Sit     Supine to sit: Min guard     General bed mobility comments: increased time and use of rail pt was able to self perform.    Transfers Overall transfer level: Needs assistance Equipment used: Rolling walker (2 wheeled) Transfers: Sit to/from Stand Sit to Stand: +2 safety/equipment;Min assist;Mod assist         General transfer comment: pt was able to stand with use of walker and several pivot/scoots R LE able to complete 1/4 turn from bed to recliner.  pt was able to static stand x 60 sec on R LE.    Ambulation/Gait             General Gait Details: non amb at this time   Information systems manager Rank[employer] (Stroke Patients Only)       Balance                                    Cognition Arousal/Alertness: Awake/alert Behavior  During Therapy: WFL for tasks assessed/performed Overall Cognitive Status: Within Functional Limits for tasks assessed                      Exercises      General Comments        Pertinent Vitals/Pain Pain Assessment: Faces Faces Pain Scale: Hurts little more Pain Location: L LE with activity Pain Descriptors / Indicators: Sore;Tender Pain Intervention(s): Monitored during session;Repositioned    Home Living                      Prior Function            PT Goals (current goals can now be found in the care plan section) Progress towards PT goals: Progressing toward goals    Frequency  Min 3X/week    PT Plan Current plan remains appropriate    Co-evaluation             End of Session Equipment Utilized During Treatment: Gait belt Activity Tolerance: Patient tolerated treatment well Patient left: in chair;with call bell/phone within reach     Time: 1150-1205 PT Time Calculation (min) (ACUTE  ONLY): 15 min  Charges:  $Therapeutic Activity: 8-22 mins                    G Codes:      Felecia Shelling  PTA [facility]  Acute  Rehab Pager      [phone]

## 2015-03-15 DIAGNOSIS — T402X5A Adverse effect of other opioids, initial encounter: Secondary | ICD-10-CM

## 2015-03-15 DIAGNOSIS — K5909 Other constipation: Secondary | ICD-10-CM

## 2015-03-15 LAB — GLUCOSE, CAPILLARY
Glucose-Capillary: 123 mg/dL — ABNORMAL HIGH (ref 65–99)
Glucose-Capillary: 113 mg/dL — ABNORMAL HIGH (ref 65–99)

## 2015-03-15 LAB — BASIC METABOLIC PANEL
Anion gap: 11 (ref 5–15)
BUN: 8 mg/dL (ref 6–20)
CALCIUM: 9 mg/dL (ref 8.9–10.3)
CO2: 26 mmol/L (ref 22–32)
CREATININE: 1.73 mg/dL — AB (ref 0.61–1.24)
Chloride: 107 mmol/L (ref 101–111)
GFR, EST AFRICAN AMERICAN: 47 mL/min — AB (ref >60)
GFR, EST NON AFRICAN AMERICAN: 41 mL/min — AB (ref >60)
Glucose, Bld: 128 mg/dL — ABNORMAL HIGH (ref 65–99)
Potassium: 3.3 mmol/L — ABNORMAL LOW (ref 3.5–5.1)
Sodium: 144 mmol/L (ref 135–145)

## 2015-03-15 MED ORDER — LORAZEPAM 1 MG PO TABS: 1.0000 mg | ORAL_TABLET | Freq: Once | ORAL | Status: DC

## 2015-03-15 MED ORDER — BOOST / RESOURCE BREEZE PO LIQD
1.0000 | Freq: Two times a day (BID) | ORAL | Status: DC
Start: 2015-03-15 — End: 2015-03-22

## 2015-03-15 MED ORDER — OXYCODONE HCL 5 MG PO TABS: 5.0000 mg | ORAL_TABLET | ORAL | Status: DC | PRN

## 2015-03-15 MED ORDER — LACTULOSE 10 GM/15ML PO SOLN: 10.0000 g | Freq: Every day | ORAL | Status: DC | PRN

## 2015-03-15 NOTE — Progress Notes (Signed)
Report given to Wardell Heath, RN at [facility]. Pt transported via EMS.

## 2015-03-15 NOTE — Care Management Note (Signed)
Case Management Note  Patient Details  Name: Samuel [name] MRN: [id number] Date of Birth: 10-02-1950  Subjective/Objective:                    Action/Plan:d/c SNF.   Expected Discharge Date:   (unknown)               Expected Discharge Plan:  Skilled Nursing Facility  In-House Referral:  Clinical Social Work  Discharge planning Services  CM Consult  Post Acute Care Choice:  NA Choice offered to:  NA  DME Arranged:    DME Agency:     HH Arranged:    HH Agency:     Status of Service:  Completed, signed off  Medicare Important Message Given:  Yes-third notification given Date Medicare IM Given:    Medicare IM give by:    Date Additional Medicare IM Given:    Additional Medicare Important Message give by:     If discussed at Long Length of Stay Meetings, dates discussed:    Additional Comments:  Lanier Clam, RN 03/15/2015, 12:14 PM

## 2015-03-15 NOTE — Discharge Summary (Addendum)
Discharge Summary  Samuel [name] LNL:[id number] DOB: Nov 22, 1950  PCP: Jani Gravel, MD  Admit date: 03/07/2015 Discharge date: 03/15/2015  Time spent: 35 minutes  Recommendations for Outpatient Follow-up:  1. New Medication: OxyIR 5 mg every 4 hours when necessary for pain 2. New medication: MiraLAX 17 g by mouth daily  3. New medication: Lactulose 10 cc by mouth as needed if no bowel movement every 24 hours 4. Patient should have creatinine rechecked on Friday 9/30 5. New: Boost breeze feeding supplement twice a day between meals  Discharge Diagnoses:  Active Hospital Problems   Diagnosis Date Noted  . Sepsis 03/07/2015  . Osteomyelitis of foot   . Protein-calorie malnutrition, severe 03/08/2015  . Osteomyelitis of foot, acute 03/07/2015  . Diabetes mellitus type 2, uncontrolled 03/07/2015  . Normocytic normochromic anemia 03/07/2015  . Obesity (BMI 30-39.9) 03/07/2015  . Nausea & vomiting   . Coronary artery disease 05/01/2013  . Hyperlipidemia 05/01/2013    Resolved Hospital Problems   Diagnosis Date Noted Date Resolved  No resolved problems to display.    Discharge Condition: Improved, being discharged to short-term skilled nursing  Diet recommendation: Carb modified  Filed Weights   03/13/15 0447 03/14/15 0518 03/15/15 0500  Weight: 130.1 kg (286 lb 13.1 oz) 129.9 kg (286 lb 6 oz) 129.8 kg (286 lb 2.5 oz)    History of present illness:  64 year old male with past medical history of obesity and diabetes mellitus as Well as previous osteomyelitis of the left leg admitted on 9/19 for sepsis secondary to large open wound of the left foot with confirmed osteomyelitis under. Patient placed in stepdown unit and started on broad-spectrum antibiotics.  Hospital Course:  Principal Problem:   Sepsis secondary to staph aureus from osteomyelitis of left foot cause behind uncontrolled diabetes mellitus: Patient met criteria for sepsis based on tachycardia, fever, left foot  osteomyelitis source. Orthopedic surgery consulted. 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 Wound cultures grew out pansensitive staph aureus, however by postop, patient no longer febrile and with source of infection removed, antibiotics will be discontinued. Blood cultures negative.  Postop, seen by PT who recommended short-term skilled nursing Active Problems:   Coronary artery disease: Stable  Acute kidney injury: Creatinine normal and last checked on 9/20. Rechecked on 9/23 and found to be elevated at 2.04.  With hydration, improved to 1.73 by day of discharge. Patient not on ARB or diuretic.  Recommend that physician at skilled nursing facility recheck renal function on Friday, 9/30.    Hyperlipidemia: Stable  Severe protein calorie malnutrition: Patient meets criteria in the context of acute illness/injury. Seen by nutrition and started on boost breeze twice a day between meals    Diabetes mellitus type 2, uncontrolled with severe chronic secondary gastroparesis: Patient put on insulin plus sliding scale and gastroparesis treated with Reglan    Normocytic normochromic anemia    Nausea & vomiting: In part from constipation and gastroparesis. Responded well to Fleet enema on 9/22. Patient did not have a bowel movement again for several days. Given lactulose prior to discharge which worked well. Have added lactulose 30 cc daily if no bowel movement every 24 hours.   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 If you have any questions about your discharge medications or the care you received while you were in the hospital after you are discharged, you can call the unit and asked to speak with the hospitalist on call if the hospitalist that took care of you is not available. Once you are discharged, your primary care physician will handle any further medical issues. Please note that NO REFILLS for any discharge medications will be authorized once you are discharged, as it is imperative that you return to your primary care physician (or establish a relationship with a primary care physician if you do not have one) for your aftercare needs so that they can reassess your need for medications and monitor your lab values.      Discharge Instructions    Diet - low sodium heart healthy    Complete by:  As directed      Increase activity slowly    Complete by:  As directed             Medication List    STOP taking these medications        cephALEXin 500 MG capsule  Commonly known as:  KEFLEX     HYDROcodone-acetaminophen 5-325 MG tablet  Commonly known as:  NORCO/VICODIN     sulfamethoxazole-trimethoprim 800-160 MG tablet  Commonly known as:  SEPTRA DS      TAKE these medications        aspirin 325 MG tablet  Take 325 mg by mouth every 6 (six) hours as needed for mild pain (pain).     COLACE 100 MG capsule  Generic drug:  docusate sodium  Take 100 mg by mouth every evening.     feeding supplement Liqd  Take 1 Container by mouth 2 (two) times daily between meals.     ferrous sulfate 325 (65 FE) MG tablet  Take 325 mg by mouth every evening.     gabapentin 300 MG capsule  Commonly known as:  NEURONTIN  Take 300 mg by mouth 3 (three) times daily.     insulin detemir 100 UNIT/ML injection  Commonly known as:  LEVEMIR  Inject 65 Units into the skin 2 (two) times daily.     insulin lispro 100 UNIT/ML injection   Commonly known as:  HUMALOG  Inject 4-7 Units into the skin 3 (three) times daily before meals. Sliding scale     INVOKANA 100 MG Tabs tablet  Generic drug:  canagliflozin  Take 100 mg by mouth every evening.     lactulose 10 GM/15ML solution  Commonly known as:  CHRONULAC  Take 15 mLs (10 g total) by mouth daily as needed for mild constipation.     lansoprazole 30 MG capsule  Commonly known as:  PREVACID  Take 30 mg by mouth 2 (two) times daily.     metoCLOPramide 10 MG tablet  Commonly known as:  REGLAN  Take 10 mg by mouth 4 (four) times daily.     naproxen sodium 220 MG tablet  Commonly known as:  ANAPROX  Take 440 mg by mouth 2 (two) times daily as needed (pain).     oxyCODONE 5 MG immediate release tablet  Commonly known  as:  Oxy IR/ROXICODONE  Take 1 tablet (5 mg total) by mouth every 4 (four) hours as needed for severe pain.     pioglitazone 30 MG tablet  Commonly known as:  ACTOS  Take 30 mg by mouth at bedtime.     polyethylene glycol packet  Commonly known as:  MIRALAX / GLYCOLAX  Take 17 g by mouth daily.     promethazine 25 MG tablet  Commonly known as:  PHENERGAN  Take 25 mg by mouth every 6 (six) hours as needed for nausea or vomiting.     ranitidine 150 MG tablet  Commonly known as:  ZANTAC  Take 150 mg by mouth daily as needed for heartburn.     simvastatin 40 MG tablet  Commonly known as:  ZOCOR  Take 60 mg by mouth every evening. Take 1 and 1/2 tablets daily     sitaGLIPtin 100 MG tablet  Commonly known as:  JANUVIA  Take 100 mg by mouth every evening.       Allergies  Allergen Reactions  . Codeine     REACTION: GI Upset  . Gabapentin Other (See Comments)    REACTION: rash/hives (per patient, was a reaction to an inactive ingredient in another mgf brand). The patient stated that he does take this medication now and it doesn't cause a rash any more.  . Nabumetone     REACTION: sick on stomach and rash   Follow-up Information    Follow  up with [facility], Jenny Reichmann, MD. Schedule an appointment as soon as possible for a visit in 2 weeks.   Specialty:  Orthopedic Surgery   Contact information:   [address] [employer] [address] [place] [address] [phone]        The results of significant diagnostics from this hospitalization (including imaging, microbiology, ancillary and laboratory) are listed below for reference.    Significant Diagnostic Studies: Dg Abd Acute W/chest  03/07/2015   CLINICAL DATA:  Two week history nausea, vomiting, and diarrhea.  EXAM: DG ABDOMEN ACUTE W/ 1V CHEST  COMPARISON:  Chest 01/05/2014  FINDINGS: Shallow inspiration with linear atelectasis in the lung bases. Heart size and pulmonary vascularity are normal. No focal airspace disease in the lungs.  Scattered gas and stool in the colon. No small or large bowel distention. No free intra-abdominal air. No abnormal air-fluid levels. Surgical clips in the right upper quadrant. Degenerative changes in the spine. No radiopaque stones.  IMPRESSION: Shallow inspiration with linear atelectasis in the lung bases.  Nonobstructive bowel gas pattern.   Electronically Signed   By: Lucienne Capers M.D.   On: 03/07/2015 06:13   Dg Foot Complete Left  03/07/2015   CLINICAL DATA:  Wound. Obvious wounds and odor to the left foot. History of diabetes.  EXAM: LEFT FOOT - COMPLETE 3+ VIEW  COMPARISON:  12/26/2009  FINDINGS: Since previous study, there has been interval removal of surgical fixation devices. There appear to be a postoperative changes with osteotomies and effusion at the first, second, and possibly third tarsometatarsal joints. Few segments appear intact. No evidence of acute fracture or dislocation. Soft tissue swelling about the first metatarsal-phalangeal joint. There is lucency in the bone at the first metatarsal head, new since prior study. This may indicate evidence of osteomyelitis. No soft tissue gas collections demonstrated. Prominent vascular calcification.   IMPRESSION: Postoperative fusion of the first through third tarsometatarsal joints. Degenerative changes. Soft tissue changes of the first metatarsal phalangeal joint with apparent lucency in the first  metatarsal head suggest probable osteomyelitis.   Electronically Signed   By: Lucienne Capers M.D.   On: 03/07/2015 04:28    Microbiology: Recent Results (from the past 240 hour(s))  Blood Culture (routine x 2)     Status: None   Collection Time: 03/07/15  4:10 AM  Result Value Ref Range Status   Specimen Description BLOOD RIGHT ARM  Final   Special Requests BOTTLES DRAWN AEROBIC AND ANAEROBIC 5CC  Final   Culture   Final    NO GROWTH 5 DAYS Performed at [facility]    Report Status 03/12/2015 FINAL  Final  Urine culture     Status: None   Collection Time: 03/07/15  4:36 AM  Result Value Ref Range Status   Specimen Description URINE, CATHETERIZED  Final   Special Requests NONE  Final   Culture   Final    NO GROWTH 1 DAY Performed at [facility]    Report Status 03/08/2015 FINAL  Final  Wound culture     Status: None   Collection Time: 03/07/15  4:46 AM  Result Value Ref Range Status   Specimen Description WOUND LEFT FOOT  Final   Special Requests Normal  Final   Gram Stain   Final    MODERATE WBC PRESENT,BOTH PMN AND MONONUCLEAR NO SQUAMOUS EPITHELIAL CELLS SEEN NO ORGANISMS SEEN Performed at [facility]    Culture   Final    MODERATE STAPHYLOCOCCUS AUREUS Note: RIFAMPIN AND GENTAMICIN SHOULD NOT BE USED AS SINGLE DRUGS FOR TREATMENT OF STAPH INFECTIONS. Performed at [facility]    Report Status 03/09/2015 FINAL  Final   Organism ID, Bacteria STAPHYLOCOCCUS AUREUS  Final      Susceptibility   Staphylococcus aureus - MIC*    CLINDAMYCIN <=0.25 SENSITIVE Sensitive     ERYTHROMYCIN 0.5 SENSITIVE Sensitive     GENTAMICIN <=0.5 SENSITIVE Sensitive     LEVOFLOXACIN 0.25 SENSITIVE Sensitive     OXACILLIN 0.5 SENSITIVE Sensitive     RIFAMPIN  <=0.5 SENSITIVE Sensitive     TRIMETH/SULFA <=10 SENSITIVE Sensitive     VANCOMYCIN 1 SENSITIVE Sensitive     TETRACYCLINE <=1 SENSITIVE Sensitive     MOXIFLOXACIN <=0.25 SENSITIVE Sensitive     * MODERATE STAPHYLOCOCCUS AUREUS  Blood Culture (routine x 2)     Status: None   Collection Time: 03/07/15  4:48 AM  Result Value Ref Range Status   Specimen Description BLOOD LEFT FOREARM  Final   Special Requests BOTTLES DRAWN AEROBIC AND ANAEROBIC 5CC  Final   Culture   Final    NO GROWTH 5 DAYS Performed at [facility]    Report Status 03/12/2015 FINAL  Final  MRSA PCR Screening     Status: None   Collection Time: 03/07/15  6:28 AM  Result Value Ref Range Status   MRSA by PCR NEGATIVE NEGATIVE Final    Comment:        The GeneXpert MRSA Assay (FDA approved for NASAL specimens only), is one component of a comprehensive MRSA colonization surveillance program. It is not intended to diagnose MRSA infection nor to guide or monitor treatment for MRSA infections.      Labs: Basic Metabolic Panel:  Recent Labs Lab 03/11/15 0505 03/12/15 0530 03/14/15 0435 03/15/15 0612  NA 140 142 142 144  K 3.6 3.7 3.1* 3.3*  CL 106 107 106 107  CO2 _0 GLUCOSE 108* 118* 132* 128*  BUN 10 11  10 8  CREATININE 2.04* 1.96* 1.94* 1.73*  CALCIUM 8.3* 8.3* 8.6* 9.0   Liver Function Tests:  Recent Labs Lab 03/11/15 0505 03/12/15 0530 03/14/15 0435  AST _0 ALT 12* 13* 11*  ALKPHOS 49 49 46  BILITOT 0.7 0.5 0.2*  PROT 6.4* 6.4* 6.6  ALBUMIN 2.2* 2.3* 2.4*   No results for input(s): LIPASE, AMYLASE in the last 168 hours. No results for input(s): AMMONIA in the last 168 hours. CBC:  Recent Labs Lab 03/11/15 0505 03/12/15 0530 03/14/15 0435  WBC 5.9 5.4 4.7  HGB 11.4* 11.1* 10.9*  HCT 35.9* 35.2* 34.7*  MCV 94.0 94.4 94.0  PLT 270 296 309   Cardiac Enzymes: No results for input(s): CKTOTAL, CKMB, CKMBINDEX, TROPONINI in the last 168 hours. BNP: BNP  (last 3 results) No results for input(s): BNP in the last 8760 hours.  ProBNP (last 3 results) No results for input(s): PROBNP in the last 8760 hours.  CBG:  Recent Labs Lab 03/14/15 0755 03/14/15 1212 03/14/15 1649 03/14/15 2052 03/15/15 0751  GLUCAP 128* 124* 118* 136* 123*       Signed:  Gevena Barre K  [facility] 03/15/2015, 12:11 PM

## 2015-03-15 NOTE — Clinical Social Work Placement (Addendum)
Patient is set to discharge to [facility] today. Patient & brother, Nedra Hai aware. Discharge packet given to RN, Cicero Duck. [facility] called for transport for pickup @ 1:30pm.     Lincoln Maxin, LCSW [facility] Clinical Social Worker cell #: [phone]    CLINICAL SOCIAL WORK PLACEMENT  NOTE  Date:  03/15/2015  Patient Details  Name: Samuel [name] MRN: [id number] Date of Birth: 03/19/1951  Clinical Social Work is seeking post-discharge placement for this patient at the Skilled  Nursing Facility level of care (*CSW will initial, date and re-position this form in  chart as items are completed):  Yes   Patient/family provided with [facility] Clinical Social Work Department's list of facilities offering this level of care within the geographic area requested by the patient (or if unable, by the patient's family).  Yes   Patient/family informed of their freedom to choose among providers that offer the needed level of care, that participate in Medicare, Medicaid or managed care program needed by the patient, have an available bed and are willing to accept the patient.  Yes   Patient/family informed of [facility]'s ownership interest in [facility] and [facility], as well as of the fact that they are under no obligation to receive care at these facilities.  PASRR submitted to EDS on 03/09/15     PASRR number received on 03/09/15     Existing PASRR number confirmed on       FL2 transmitted to all facilities in geographic area requested by pt/family on 03/09/15     FL2 transmitted to all facilities within larger geographic area on       Patient informed that his/her managed care company has contracts with or will negotiate with certain facilities, including the following:        Yes   Patient/family informed of bed offers received.  Patient chooses bed at [facility]     Physician recommends and patient chooses bed at      Patient to be transferred to  [facility] on 03/15/15.  Patient to be transferred to facility by [facility]     Patient family notified on 03/15/15 of transfer.  Name of family member notified:  patient's brother, Nedra Hai via phone     PHYSICIAN       Additional Comment:    _______________________________________________ Arlyss Repress, LCSW 03/15/2015, 11:52 AM

## 2015-03-15 NOTE — Progress Notes (Signed)
Attempted to give report to [facility] 3 times. Writer was unable to get a Charity fundraiser to phone for report.

## 2015-03-16 ENCOUNTER — Non-Acute Institutional Stay (SKILLED_NURSING_FACILITY): Payer: Medicare Other | Admitting: Nurse Practitioner

## 2015-03-16 DIAGNOSIS — D649 Anemia, unspecified: Secondary | ICD-10-CM

## 2015-03-16 DIAGNOSIS — N179 Acute kidney failure, unspecified: Secondary | ICD-10-CM | POA: Diagnosis not present

## 2015-03-16 DIAGNOSIS — E1165 Type 2 diabetes mellitus with hyperglycemia: Secondary | ICD-10-CM | POA: Diagnosis not present

## 2015-03-16 DIAGNOSIS — G43A1 Cyclical vomiting, intractable: Secondary | ICD-10-CM | POA: Diagnosis not present

## 2015-03-16 DIAGNOSIS — K3184 Gastroparesis: Secondary | ICD-10-CM

## 2015-03-16 DIAGNOSIS — K5900 Constipation, unspecified: Secondary | ICD-10-CM

## 2015-03-16 DIAGNOSIS — I10 Essential (primary) hypertension: Secondary | ICD-10-CM

## 2015-03-16 DIAGNOSIS — M86172 Other acute osteomyelitis, left ankle and foot: Secondary | ICD-10-CM

## 2015-03-16 DIAGNOSIS — R1115 Cyclical vomiting syndrome unrelated to migraine: Secondary | ICD-10-CM

## 2015-03-16 DIAGNOSIS — IMO0002 Reserved for concepts with insufficient information to code with codable children: Secondary | ICD-10-CM

## 2015-03-16 NOTE — Progress Notes (Signed)
Patient ID: Samuel [name], male   DOB: 1950-10-09, 64 y.o.   MRN: [id number]    Nursing Home Location:  [facility]   Place of Service: SNF (31)  PCP: [name], MD  Allergies  Allergen Reactions  . Codeine     REACTION: GI Upset  . Gabapentin Other (See Comments)    REACTION: rash/hives (per patient, was a reaction to an inactive ingredient in another mgf brand). The patient stated that he does take this medication now and it doesn't cause a rash any more.  . Nabumetone     REACTION: sick on stomach and rash    Chief Complaint  Patient presents with  . Hospitalization Follow-up    HPI:  Patient is a 64 y.o. male seen today at [facility] follow up hospitalization. past medical history of obesity, diabetes mellitus, gastroparesis, nausea, constipation, hyperlipidemia, anemia.  Pt also with hx of previous osteomyelitis of the left leg admitted on 9/19 for sepsis secondary to large open wound of the left foot with confirmed osteomyelitis. 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 Hospitalization complicated by AKI which improved with IV hydration. Pt currently at [facility] for rehab and wound care. Pain is currently under control with current regimen. Denies constipation at this time, as medication is currently effective. Pt does report shortness of breath earlier and drop in O2 sats, oxygen applied and symptoms improved. Pt denies shortness of breath, chest pains, cough, congestion at this time. Reports long standing hx of nausea, pt reports this is due to gastroparesis and manages with phenergan at home. Request liquid form as he can not tolerate pil form.   Review of Systems:  Review of Systems  Constitutional: Negative for activity change, appetite change, fatigue and unexpected weight change.  HENT: Negative for congestion and hearing loss.   Eyes: Negative.   Respiratory: Positive for  shortness of breath (prior to being on O2, has improved now). Negative for cough.   Cardiovascular: Negative for chest pain, palpitations and leg swelling.  Gastrointestinal: Positive for nausea and constipation. Negative for abdominal pain and diarrhea.  Genitourinary: Negative for dysuria and difficulty urinating.  Musculoskeletal: Negative for myalgias and arthralgias.  Skin: Negative for color change and wound.  Neurological: Negative for dizziness and weakness.       Phantom pain to right LE  Psychiatric/Behavioral: Negative for behavioral problems, confusion and agitation.    Past Medical History  Diagnosis Date  . Hypertension   . CAD (coronary artery disease)   . Gastroparesis   . GERD (gastroesophageal reflux disease)   . Hyperlipidemia   . Peripheral neuropathy   . PONV (postoperative nausea and vomiting)   . Shortness of breath     exertion  . Depression   . Anxiety   . Pulmonary embolism     hx. of 2012  . Peripheral vascular disease   . OSA (obstructive sleep apnea)     "not bad enough for a mask"  . Type II diabetes mellitus   . 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 H/O hiatal hernia   . Arthritis     "all over"   . Charcot's joint     "left foot"   Past Surgical History  Procedure Laterality Date  . Foot surgery Left 2010    "for Charcot's joint"  . Vena cava filter placement  2012  . I&d extremity  Left "multiple"    leg  . Im nailing tibia Left ~ 2012  . Toe amputation Right ~ 2011    "great toe"  . Shoulder arthroscopy w/ rotator cuff repair Left     "and bone spurs"  . Application of wound vac Left 01/07/2014  . Wound debridement Left 01/07/2014    "tibia"  . Tibial im rod removal Left 01/07/2014  . Cholecystectomy  06/2010  . Coronary angioplasty with stent placement  05/2009    "1"  . Hardware removal Left 01/07/2014    Procedure: LEFT LEG REMOVAL OF DEEP IMPLANT AND SEQUESTRECTOMY; APPLICATION OF WOUND VAC ;  Surgeon: Toni Arthurs, MD;  Location: [facility] OR;  Service: Orthopedics;  Laterality: Left;  . I&d extremity Left 01/07/2014    Procedure: 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;  Surgeon: Toni Arthurs, MD;  Location: [facility] OR;  Service: Orthopedics;  Laterality: Left;  . Amputation Left 03/08/2015    Procedure:  LEFT AMPUTATION BELOW KNEE;  Surgeon: Toni Arthurs, MD;  Location: [facility] ORS;  Service: Orthopedics;  Laterality: Left;   Social History:   reports that he has never smoked. He has never used smokeless tobacco. He reports that he drinks alcohol. He reports that he does not use illicit drugs.  Family History  Problem Relation Age of Onset  . COPD Mother   . Heart disease Mother   . Lung cancer Mother   . Retinal detachment Father   . Alcoholism Brother   . Heart disease Brother   . COPD Brother   . Diabetes Brother   . Hypertension Brother   . Stroke Brother     Medications: Patient's Medications  New Prescriptions   No medications on file  Previous Medications   ASPIRIN 325 MG TABLET    Take 325 mg by mouth every 6 (six) hours as needed for mild pain (pain).    CANAGLIFLOZIN (INVOKANA) 100 MG TABS    Take 100 mg by mouth every evening.    DOCUSATE SODIUM (COLACE) 100 MG CAPSULE    Take 100 mg by mouth every evening.    FEEDING SUPPLEMENT (BOOST / RESOURCE BREEZE) LIQD    Take 1 Container by mouth 2 (two) times daily between meals.   FERROUS SULFATE 325 (65 FE) MG TABLET    Take 325 mg by mouth every evening.    GABAPENTIN (NEURONTIN) 300 MG CAPSULE    Take 300 mg by mouth 3 (three) times daily.   INSULIN DETEMIR (LEVEMIR) 100 UNIT/ML INJECTION    Inject 65 Units into the skin 2 (two) times daily.    INSULIN LISPRO (HUMALOG) 100 UNIT/ML INJECTION    Inject 4-7 Units into the skin 3 (three) times daily before meals. Sliding scale   LACTULOSE (CHRONULAC) 10 GM/15ML SOLUTION    Take 15 mLs (10 g total) by mouth daily as needed for mild constipation.   LANSOPRAZOLE (PREVACID) 30 MG CAPSULE     Take 30 mg by mouth 2 (two) times daily.     METOCLOPRAMIDE (REGLAN) 10 MG TABLET    Take 10 mg by mouth 4 (four) times daily.   NAPROXEN SODIUM (ANAPROX) 220 MG TABLET    Take 440 mg by mouth 2 (two) times daily as needed (pain).    OXYCODONE (OXY IR/ROXICODONE) 5 MG IMMEDIATE RELEASE TABLET    Take 1 tablet (5 mg total) by mouth every 4 (four) hours as needed for severe pain.   PIOGLITAZONE (ACTOS) 30 MG TABLET  Take 30 mg by mouth at bedtime.    POLYETHYLENE GLYCOL (MIRALAX / GLYCOLAX) PACKET    Take 17 g by mouth daily.   PROMETHAZINE (PHENERGAN) 25 MG TABLET    Take 25 mg by mouth every 6 (six) hours as needed for nausea or vomiting.   RANITIDINE (ZANTAC) 150 MG TABLET    Take 150 mg by mouth daily as needed for heartburn.    SIMVASTATIN (ZOCOR) 40 MG TABLET    Take 60 mg by mouth every evening. Take 1 and 1/2 tablets daily   SITAGLIPTIN (JANUVIA) 100 MG TABLET    Take 100 mg by mouth every evening.   Modified Medications   No medications on file  Discontinued Medications   No medications on file     Physical Exam: Filed Vitals:   03/16/15 2028  BP: 171/70  Pulse: 85  Temp: 98.2 F (36.8 C)  Resp: 20    Physical Exam  Constitutional: He is oriented to person, place, and time. He appears well-developed and well-nourished. No distress.  HENT:  Head: Normocephalic and atraumatic.  Mouth/Throat: Oropharynx is clear and moist. No oropharyngeal exudate.  Eyes: Conjunctivae and EOM are normal. Pupils are equal, round, and reactive to light.  Neck: Normal range of motion. Neck supple.  Cardiovascular: Normal rate, regular rhythm and normal heart sounds.   Pulmonary/Chest: Effort normal and breath sounds normal.  Abdominal: Soft. Bowel sounds are normal.  Musculoskeletal: He exhibits no edema or tenderness.  Left BKA with dressing CDI   Neurological: He is alert and oriented to person, place, and time.  Skin: Skin is warm and dry. He is not diaphoretic.  Psychiatric: He has a  normal mood and affect.    Labs reviewed: Basic Metabolic Panel:  Recent Labs  60/45/40 0530 03/14/15 0435 03/15/15 0612  NA 142 142 144  K 3.7 3.1* 3.3*  CL 107 106 107  CO2 GLUCOSE 118* 132* 128*  BUN CREATININE 1.96* 1.94* 1.73*  CALCIUM 8.3* 8.6* 9.0   Liver Function Tests:  Recent Labs  03/11/15 0505 03/12/15 0530 03/14/15 0435  AST ALT 12* 13* 11*  ALKPHOS 49 49 46  BILITOT 0.7 0.5 0.2*  PROT 6.4* 6.4* 6.6  ALBUMIN 2.2* 2.3* 2.4*    Recent Labs  03/07/15 0411  LIPASE 12*   No results for input(s): AMMONIA in the last 8760 hours. CBC:  Recent Labs  08/22/14 1130 10/06/14 1143  03/07/15 0810  03/11/15 0505 03/12/15 0530 03/14/15 0435  WBC 4.7 6.6  < > 7.4  < > 5.9 5.4 4.7  NEUTROABS 2.8 5.4  --  5.1  --   --   --   --   HGB 13.7 14.7  < > 12.6*  < > 11.4* 11.1* 10.9*  HCT 42.1 44.3  < > 37.4*  < > 35.9* 35.2* 34.7*  MCV 96.6 93.7  < > 90.6  < > 94.0 94.4 94.0  PLT 198 250  < > 274  < > 270 296 309  < > = values in this interval not displayed. TSH: No results for input(s): TSH in the last 8760 hours. A1C: Lab Results  Component Value Date   HGBA1C 7.7* 12/21/2013   Lipid Panel: No results for input(s): CHOL, HDL, LDLCALC, TRIG, CHOLHDL, LDLDIRECT in the last 8760 hours.  Radiological Exams: Dg Abd Acute W/chest  03/07/2015   CLINICAL DATA:  Two week history nausea, vomiting, and  diarrhea.  EXAM: DG ABDOMEN ACUTE W/ 1V CHEST  COMPARISON:  Chest 01/05/2014  FINDINGS: Shallow inspiration with linear atelectasis in the lung bases. Heart size and pulmonary vascularity are normal. No focal airspace disease in the lungs.  Scattered gas and stool in the colon. No small or large bowel distention. No free intra-abdominal air. No abnormal air-fluid levels. Surgical clips in the right upper quadrant. Degenerative changes in the spine. No radiopaque stones.  IMPRESSION: Shallow inspiration with linear atelectasis in the lung  bases.  Nonobstructive bowel gas pattern.   Electronically Signed   By: Burman Nieves M.D.   On: 03/07/2015 06:13   Dg Foot Complete Left  03/07/2015   CLINICAL DATA:  Wound. Obvious wounds and odor to the left foot. History of diabetes.  EXAM: LEFT FOOT - COMPLETE 3+ VIEW  COMPARISON:  12/26/2009  FINDINGS: Since previous study, there has been interval removal of surgical fixation devices. There appear to be a postoperative changes with osteotomies and effusion at the first, second, and possibly third tarsometatarsal joints. Few segments appear intact. No evidence of acute fracture or dislocation. Soft tissue swelling about the first metatarsal-phalangeal joint. There is lucency in the bone at the first metatarsal head, new since prior study. This may indicate evidence of osteomyelitis. No soft tissue gas collections demonstrated. Prominent vascular calcification.  IMPRESSION: Postoperative fusion of the first through third tarsometatarsal joints. Degenerative changes. Soft tissue changes of the first metatarsal phalangeal joint with apparent lucency in the first metatarsal head suggest probable osteomyelitis.   Electronically Signed   By: Burman Nieves M.D.   On: 03/07/2015 04:28    Assessment/Plan 1. Osteomyelitis of foot, acute, left Which resulted in hospitalization and sepsis. Pt now s/p left BKA.  Pain is well controlled on oxycodone q 4 hours.  -dressing and brace in place  2. Diabetes mellitus type 2, uncontrolled Not well controlled in the past, blood sugar 150 this morning, conts on invokana, levemir 65 units BID, SSI, Actos, januvia   3. Normocytic normochromic anemia -will follow up CBC  4. Intractable cyclical vomiting with nausea -due to gastroparesis and constipation. Reports worse in the morning. Request phenergan be changed to liquid form. Will order. Cont current dose and frequency.   5. Gastroparesis Ongoing, conts on reglan 10 mg QID  6. Essential  hypertension -elevated blood pressure today and in hospital upon review of chart -pt with recent AKI and not currently on blood pressure medication -will start norvasc 5 mg daily and have staff take BS q shift to monitor blood pressure  7. Shortness of breath Earlier today, O2 saturates reveals sats in the high 80s. Pt placed on 2L with improvement of symptoms. No shortness of breath at this time, no cough, congestion. No COPD or CHF hx. Pt encouraged to get OOB and deep breath as tolarates To wean O2 for sats greater than 92 % IS q 1 hour while awake   8. AKI Improved in hospital with hydration, will follow up BMP on 03/18/15.   9. Constipation -well controlled on current regimen    Jessica K. Biagio Borg  [facility] [phone] 8 am - 5 pm) [phone] (after hours)

## 2015-03-19 ENCOUNTER — Encounter (HOSPITAL_COMMUNITY): Payer: Self-pay | Admitting: Emergency Medicine

## 2015-03-19 ENCOUNTER — Emergency Department (HOSPITAL_COMMUNITY)
Admission: EM | Admit: 2015-03-19 | Discharge: 2015-03-19 | Disposition: A | Discharge status: Skilled Nursing Facility | Payer: Medicare Other | Attending: Emergency Medicine | Admitting: Emergency Medicine

## 2015-03-19 DIAGNOSIS — G629 Polyneuropathy, unspecified: Secondary | ICD-10-CM | POA: Insufficient documentation

## 2015-03-19 DIAGNOSIS — E785 Hyperlipidemia, unspecified: Secondary | ICD-10-CM | POA: Diagnosis not present

## 2015-03-19 DIAGNOSIS — Z9861 Coronary angioplasty status: Secondary | ICD-10-CM | POA: Insufficient documentation

## 2015-03-19 DIAGNOSIS — F419 Anxiety disorder, unspecified: Secondary | ICD-10-CM | POA: Diagnosis not present

## 2015-03-19 DIAGNOSIS — I2510 Atherosclerotic heart disease of native coronary artery without angina pectoris: Secondary | ICD-10-CM | POA: Insufficient documentation

## 2015-03-19 DIAGNOSIS — N289 Disorder of kidney and ureter, unspecified: Secondary | ICD-10-CM | POA: Diagnosis not present

## 2015-03-19 DIAGNOSIS — F329 Major depressive disorder, single episode, unspecified: Secondary | ICD-10-CM | POA: Insufficient documentation

## 2015-03-19 DIAGNOSIS — E119 Type 2 diabetes mellitus without complications: Secondary | ICD-10-CM | POA: Insufficient documentation

## 2015-03-19 DIAGNOSIS — Z7982 Long term (current) use of aspirin: Secondary | ICD-10-CM | POA: Diagnosis not present

## 2015-03-19 DIAGNOSIS — K219 Gastro-esophageal reflux disease without esophagitis: Secondary | ICD-10-CM | POA: Insufficient documentation

## 2015-03-19 DIAGNOSIS — Z794 Long term (current) use of insulin: Secondary | ICD-10-CM | POA: Diagnosis not present

## 2015-03-19 DIAGNOSIS — Z86711 Personal history of pulmonary embolism: Secondary | ICD-10-CM | POA: Insufficient documentation

## 2015-03-19 DIAGNOSIS — Z79899 Other long term (current) drug therapy: Secondary | ICD-10-CM | POA: Diagnosis not present

## 2015-03-19 DIAGNOSIS — I10 Essential (primary) hypertension: Secondary | ICD-10-CM | POA: Insufficient documentation

## 2015-03-19 DIAGNOSIS — M158 Other polyosteoarthritis: Secondary | ICD-10-CM | POA: Insufficient documentation

## 2015-03-19 DIAGNOSIS — R7989 Other specified abnormal findings of blood chemistry: Secondary | ICD-10-CM | POA: Diagnosis present

## 2015-03-19 LAB — CBC WITH DIFFERENTIAL/PLATELET
Basophils Absolute: 0 10*3/uL (ref 0.0–0.1)
Basophils Relative: 1 %
EOS PCT: 2 %
Eosinophils Absolute: 0.1 10*3/uL (ref 0.0–0.7)
HCT: 36.5 % — ABNORMAL LOW (ref 39.0–52.0)
HEMOGLOBIN: 12 g/dL — AB (ref 13.0–17.0)
LYMPHS ABS: 1 10*3/uL (ref 0.7–4.0)
LYMPHS PCT: 17 %
MCH: 30.3 pg (ref 26.0–34.0)
MCHC: 32.9 g/dL (ref 30.0–36.0)
MCV: 92.2 fL (ref 78.0–100.0)
MONOS PCT: 10 %
Monocytes Absolute: 0.6 10*3/uL (ref 0.1–1.0)
Neutro Abs: 4.1 10*3/uL (ref 1.7–7.7)
Neutrophils Relative %: 70 %
PLATELETS: 303 10*3/uL (ref 150–400)
RBC: 3.96 MIL/uL — AB (ref 4.22–5.81)
RDW: 13.4 % (ref 11.5–15.5)
WBC: 5.8 10*3/uL (ref 4.0–10.5)

## 2015-03-19 LAB — BASIC METABOLIC PANEL
Anion gap: 9 (ref 5–15)
BUN: 20 mg/dL (ref 6–20)
CHLORIDE: 97 mmol/L — AB (ref 101–111)
CO2: 33 mmol/L — ABNORMAL HIGH (ref 22–32)
Calcium: 8.9 mg/dL (ref 8.9–10.3)
Creatinine, Ser: 1.97 mg/dL — ABNORMAL HIGH (ref 0.61–1.24)
GFR calc Af Amer: 40 mL/min — ABNORMAL LOW (ref >60)
GFR calc non Af Amer: 35 mL/min — ABNORMAL LOW (ref >60)
Glucose, Bld: 126 mg/dL — ABNORMAL HIGH (ref 65–99)
POTASSIUM: 3.4 mmol/L — AB (ref 3.5–5.1)
Sodium: 139 mmol/L (ref 135–145)

## 2015-03-19 NOTE — ED Provider Notes (Signed)
CSN: [id number]     Arrival date & time 03/19/15  1023 History   First MD Initiated Contact with Patient 03/19/15 1024     Chief Complaint  Patient presents with  . Abnormal Lab    HPI Patient has had a recent complicated medical history. He was admitted to the hospital last month because of sepsis associated with a diabetic wound. He ended up having a below-the-knee amputation of his left lower extremity. The patient has been recovering at a local skilled nursing facility. Patient states he's had some generalized achiness and malaise but otherwise has been feeling okay. He said some fluctuations in his blood sugars. He has not noticed any issues with his leg or any increased drainage. He denies any fevers. No vomiting. No abdominal pain or chest pain. Patient denies any issues with his urination. He has felt constipated and hasn't had a good bowel movement over the last few days. When the patient was in the hospital he did have an acute kidney injury. Patient states he had blood tests performed yesterday. Notes from the nursing home indicate that he had a critically high potassium level. He was sent to the emergency room today for further evaluation. Past Medical History  Diagnosis Date  . Hypertension   . CAD (coronary artery disease)   . Gastroparesis   . GERD (gastroesophageal reflux disease)   . Hyperlipidemia   . Peripheral neuropathy (HCC)   . PONV (postoperative nausea and vomiting)   . Shortness of breath     exertion  . Depression   . Anxiety   . Pulmonary embolism (HCC)     hx. of 2012  . Peripheral vascular disease (HCC)   . OSA (obstructive sleep apnea)     "not bad enough for a mask"  . Type II diabetes mellitus (HCC)   . 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 H/O hiatal hernia   . Arthritis     "all over"   . Charcot's joint     "left foot"   Past Surgical History  Procedure Laterality Date  . Foot surgery Left 2010    "for Charcot's joint"   . Vena cava filter placement  2012  . I&d extremity Left "multiple"    leg  . Im nailing tibia Left ~ 2012  . Toe amputation Right ~ 2011    "great toe"  . Shoulder arthroscopy w/ rotator cuff repair Left     "and bone spurs"  . Application of wound vac Left 01/07/2014  . Wound debridement Left 01/07/2014    "tibia"  . Tibial im rod removal Left 01/07/2014  . Cholecystectomy  06/2010  . Coronary angioplasty with stent placement  05/2009    "1"  . Hardware removal Left 01/07/2014    Procedure: LEFT LEG REMOVAL OF DEEP IMPLANT AND SEQUESTRECTOMY; APPLICATION OF WOUND VAC ;  Surgeon: Toni Arthurs, MD;  Location: [facility] OR;  Service: Orthopedics;  Laterality: Left;  . I&d extremity Left 01/07/2014    Procedure: 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;  Surgeon: Toni Arthurs, MD;  Location: [facility] OR;  Service: Orthopedics;  Laterality: Left;  . Amputation Left 03/08/2015    Procedure:  LEFT AMPUTATION BELOW KNEE;  Surgeon: Toni Arthurs, MD;  Location: [facility];  Service: Orthopedics;  Laterality: Left;   Family History  Problem Relation Age of Onset  . COPD Mother   . Heart disease Mother   . Lung cancer Mother   . Retinal  detachment Father   . Alcoholism Brother   . Heart disease Brother   . COPD Brother   . Diabetes Brother   . Hypertension Brother   . Stroke Brother    Social History  Substance Use Topics  . Smoking status: Never Smoker   . Smokeless tobacco: Never Used  . Alcohol Use: Yes     Comment: 01/07/2014 'might have a wine cooler a couple times/yr"    Review of Systems  All other systems reviewed and are negative.     Allergies  Codeine; Gabapentin; and Nabumetone  Home Medications   Prior to Admission medications   Medication Sig Start Date End Date Taking? Authorizing Provider  aspirin 325 MG tablet Take 325 mg by mouth every 6 (six) hours as needed for mild pain (pain).    Yes Historical Provider, MD  Canagliflozin (INVOKANA) 100 MG TABS Take 100 mg by  mouth every evening.    Yes Historical Provider, MD  docusate sodium (COLACE) 100 MG capsule Take 100 mg by mouth every evening.    Yes Historical Provider, MD  ferrous sulfate 325 (65 FE) MG tablet Take 325 mg by mouth every evening.    Yes Historical Provider, MD  gabapentin (NEURONTIN) 300 MG capsule Take 300 mg by mouth 3 (three) times daily.   Yes Historical Provider, MD  insulin detemir (LEVEMIR) 100 UNIT/ML injection Inject 65 Units into the skin 2 (two) times daily.    Yes Historical Provider, MD  insulin lispro (HUMALOG) 100 UNIT/ML injection Inject 4-7 Units into the skin 3 (three) times daily before meals. Sliding scale   Yes Historical Provider, MD  lansoprazole (PREVACID) 30 MG capsule Take 30 mg by mouth 2 (two) times daily.     Yes Historical Provider, MD  metoCLOPramide (REGLAN) 10 MG tablet Take 10 mg by mouth 4 (four) times daily.   Yes Historical Provider, MD  naproxen sodium (ANAPROX) 220 MG tablet Take 440 mg by mouth 2 (two) times daily as needed (pain).    Yes Historical Provider, MD  oxyCODONE (OXY IR/ROXICODONE) 5 MG immediate release tablet Take 1 tablet (5 mg total) by mouth every 4 (four) hours as needed for severe pain. 03/15/15  Yes Hollice Espy, MD  pioglitazone (ACTOS) 30 MG tablet Take 30 mg by mouth at bedtime.    Yes Historical Provider, MD  polyethylene glycol (MIRALAX / GLYCOLAX) packet Take 17 g by mouth daily. 03/10/15  Yes Hollice Espy, MD  promethazine (PHENERGAN) 25 MG tablet Take 25 mg by mouth every 6 (six) hours as needed for nausea or vomiting.   Yes Historical Provider, MD  quinapril (ACCUPRIL) 5 MG tablet Take 5 mg by mouth daily.  02/22/15  Yes Historical Provider, MD  ranitidine (ZANTAC) 150 MG tablet Take 150 mg by mouth daily as needed for heartburn.    Yes Historical Provider, MD  simvastatin (ZOCOR) 40 MG tablet Take 60 mg by mouth every evening. Take 1 and 1/2 tablets daily   Yes Historical Provider, MD  sitaGLIPtin (JANUVIA) 100 MG tablet  Take 100 mg by mouth every evening.    Yes Historical Provider, MD  feeding supplement (BOOST / RESOURCE BREEZE) LIQD Take 1 Container by mouth 2 (two) times daily between meals. 03/15/15   Hollice Espy, MD  lactulose (CHRONULAC) 10 GM/15ML solution Take 15 mLs (10 g total) by mouth daily as needed for mild constipation. 03/15/15   Hollice Espy, MD   BP 175/81 mmHg  Pulse 78  Temp(Src) 98 F (36.7 C) (Oral)  Resp 16  SpO2 96% Physical Exam  Constitutional: No distress.  HENT:  Head: Normocephalic and atraumatic.  Right Ear: External ear normal.  Left Ear: External ear normal.  Eyes: Conjunctivae are normal. Right eye exhibits no discharge. Left eye exhibits no discharge. No scleral icterus.  Neck: Neck supple. No tracheal deviation present.  Cardiovascular: Normal rate, regular rhythm and intact distal pulses.   Pulmonary/Chest: Effort normal and breath sounds normal. No stridor. No respiratory distress. He has no wheezes. He has no rales.  Abdominal: Soft. Bowel sounds are normal. He exhibits no distension. There is no tenderness. There is no rebound and no guarding.  Musculoskeletal: He exhibits no edema or tenderness.  BKA left lower extremity, dressing is clean and dry, no erythema of the LLE  Neurological: He is alert. He has normal strength. No cranial nerve deficit (no facial droop, extraocular movements intact, no slurred speech) or sensory deficit. He exhibits normal muscle tone. He displays no seizure activity. Coordination normal.  Skin: Skin is warm and dry. No rash noted. He is not diaphoretic.  Psychiatric: He has a normal mood and affect.  Nursing note and vitals reviewed.   ED Course  Procedures (including critical care time) Labs Review Labs Reviewed  CBC WITH DIFFERENTIAL/PLATELET - Abnormal; Notable for the following:    RBC 3.96 (*)    Hemoglobin 12.0 (*)    HCT 36.5 (*)    All other components within normal limits  BASIC METABOLIC PANEL - Abnormal;  Notable for the following:    Potassium 3.4 (*)    Chloride 97 (*)    CO2 33 (*)    Glucose, Bld 126 (*)    Creatinine, Ser 1.97 (*)    GFR calc non Af Amer 35 (*)    GFR calc Af Amer 40 (*)    All other components within normal limits    Imaging Review No results found. I have personally reviewed and evaluated these images and lab results as part of my medical decision-making.   EKG Interpretation   Date/Time:  Saturday March 19 2015 10:47:59 EDT Ventricular Rate:  79 PR Interval:  182 QRS Duration: 85 QT Interval:  419 QTC Calculation: 480 R Axis:   8 Text Interpretation:  Sinus rhythm Low voltage, precordial leads  Borderline prolonged QT interval No significant change since last tracing  Confirmed by Kolyn Rozario  MD-J, Abner Ardis ([id number]) on 03/19/2015 10:52:17 AM      MDM   Final diagnoses:  Renal insufficiency    The patient's potassium level was not elevated here in the emergency department. I suspect that the hyperkalemia noted on the blood testing done at the nursing home was a hemolyzed specimen.  She does have renal insufficiency associated with his recent hospitalization. His creatinine is slightly more  elevated  than previous levels but lower than when he was in the hospital.  No evidence of acute renal failure.  Pt otherwise is felling well.   He has no complaints.    He mentioned some trouble with constipation.  He was given mirilax at the nursing home but feels like colace has worked better in the past.  I explained to him that the mirilax may still work but he could ask for colace from the nursing home doctor.      Linwood Dibbles, MD 03/19/15 [phone]

## 2015-03-19 NOTE — Discharge Instructions (Signed)
Chronic Kidney Disease °Chronic kidney disease occurs when the kidneys are damaged over a long period. The kidneys are two organs that lie on either side of the spine between the middle of the back and the front of the abdomen. The kidneys:  °· Remove wastes and extra water from the blood.   °· Produce important hormones. These help keep bones strong, regulate blood pressure, and help create red blood cells.   °· Balance the fluids and chemicals in the blood and tissues. °A small amount of kidney damage may not cause problems, but a large amount of damage may make it difficult or impossible for the kidneys to work the way they should. If steps are not taken to slow down the kidney damage or stop it from getting worse, the kidneys may stop working permanently. Most of the time, chronic kidney disease does not go away. However, it can often be controlled, and those with the disease can usually live normal lives. °CAUSES  °The most common causes of chronic kidney disease are diabetes and high blood pressure (hypertension). Chronic kidney disease may also be caused by:  °· Diseases that cause the kidneys' filters to become inflamed.   °· Diseases that affect the immune system.   °· Genetic diseases.   °· Medicines that damage the kidneys, such as anti-inflammatory medicines.   °· Poisoning or exposure to toxic substances.   °· A reoccurring kidney or urinary infection.   °· A problem with urine flow. This may be caused by:   °¨ Cancer.   °¨ Kidney stones.   °¨ An enlarged prostate in males. °SIGNS AND SYMPTOMS  °Because the kidney damage in chronic kidney disease occurs slowly, symptoms develop slowly and may not be obvious until the kidney damage becomes severe. A person may have a kidney disease for years without showing any symptoms. Symptoms can include:  °· Swelling (edema) of the legs, ankles, or feet.   °· Tiredness (lethargy).   °· Nausea or vomiting.   °· Confusion.   °· Problems with urination, such as:    °¨ Decreased urine production.   °¨ Frequent urination, especially at night.   °¨ Frequent accidents in children who are potty trained.   °· Muscle twitches and cramps.   °· Shortness of breath.  °· Weakness.   °· Persistent itchiness.   °· Loss of appetite. °· Metallic taste in the mouth. °· Trouble sleeping. °· Slowed development in children. °· Short stature in children. °DIAGNOSIS  °Chronic kidney disease may be detected and diagnosed by tests, including blood, urine, imaging, or kidney biopsy tests.  °TREATMENT  °Most chronic kidney diseases cannot be cured. Treatment usually involves relieving symptoms and preventing or slowing the progression of the disease. Treatment may include:  °· A special diet. You may need to avoid alcohol and foods that are salty and high in potassium.   °· Medicines. These may:   °¨ Lower blood pressure.   °¨ Relieve anemia.   °¨ Relieve swelling.   °¨ Protect the bones. °HOME CARE INSTRUCTIONS  °· Follow your prescribed diet.   °· Take medicines only as directed by your health care provider. Do not take any new medicines (prescription, over-the-counter, or nutritional supplements) unless approved by your health care provider. Many medicines can worsen your kidney damage or need to have the dose adjusted.   °· Quit smoking if you smoke. Talk to your health care provider about a smoking cessation program.   °· Keep all follow-up visits as directed by your health care provider. °SEEK IMMEDIATE MEDICAL CARE IF: °· Your symptoms get worse or you develop new symptoms.   °· You develop symptoms of end-stage kidney disease. These   include:   °¨ Headaches.   °¨ Abnormally dark or light skin.   °¨ Numbness in the hands or feet.   °¨ Easy bruising.   °¨ Frequent hiccups.   °¨ Menstruation stops.   °· You have a fever.   °· You have decreased urine production.   °· You have pain or bleeding when urinating. °MAKE SURE YOU: °· Understand these instructions. °· Will watch your condition. °· Will  get help right away if you are not doing well or get worse. °FOR MORE INFORMATION  °· American Association of Kidney Patients: www.aakp.org °· National Kidney Foundation: www.kidney.org °· [employer] Kid[employer]: www.akfinc.org °· [employer] [facility]: www.lifeoptions.org and www.kidneyschool.org °Document Released: 03/13/2008 Document Revised: 10/19/2013 Document Reviewed: 02/01/2012 °ExitCare® Patient Information ©2015 [employer]. This information is not intended to replace advice given to you by your health care provider. Make sure you discuss any questions you have with your health care provider. ° °

## 2015-03-19 NOTE — ED Notes (Signed)
Bed: WU98 Expected date:  Expected time:  Means of arrival:  Comments: EMS- BKA 11days ago-not feeling well

## 2015-03-19 NOTE — ED Notes (Addendum)
PER EMS- pt picked up from [facility] nursing facility due to low potassium complaint. PT arrived to ED alert and orientedx4 per baseline.

## 2015-03-21 LAB — BASIC METABOLIC PANEL
BUN: 20 mg/dL (ref 4–21)
Creatinine: 1.9 mg/dL — AB (ref 0.6–1.3)
Glucose: 80 mg/dL
Potassium: 3.5 mmol/L (ref 3.4–5.3)
SODIUM: 140 mmol/L (ref 137–147)

## 2015-03-21 LAB — CBC AND DIFFERENTIAL
HEMATOCRIT: 37 % — AB (ref 41–53)
HEMOGLOBIN: 11.6 g/dL — AB (ref 13.5–17.5)
PLATELETS: 283 10*3/uL (ref 150–399)
WBC: 5 10^3/mL

## 2015-03-22 ENCOUNTER — Encounter: Payer: Self-pay | Admitting: Internal Medicine

## 2015-03-22 ENCOUNTER — Non-Acute Institutional Stay (SKILLED_NURSING_FACILITY): Payer: Medicare Other | Admitting: Internal Medicine

## 2015-03-22 DIAGNOSIS — Z794 Long term (current) use of insulin: Secondary | ICD-10-CM | POA: Diagnosis not present

## 2015-03-22 DIAGNOSIS — IMO0002 Reserved for concepts with insufficient information to code with codable children: Secondary | ICD-10-CM

## 2015-03-22 DIAGNOSIS — I10 Essential (primary) hypertension: Secondary | ICD-10-CM

## 2015-03-22 DIAGNOSIS — D649 Anemia, unspecified: Secondary | ICD-10-CM | POA: Diagnosis not present

## 2015-03-22 DIAGNOSIS — M86172 Other acute osteomyelitis, left ankle and foot: Secondary | ICD-10-CM | POA: Diagnosis not present

## 2015-03-22 DIAGNOSIS — R5381 Other malaise: Secondary | ICD-10-CM

## 2015-03-22 DIAGNOSIS — E43 Unspecified severe protein-calorie malnutrition: Secondary | ICD-10-CM | POA: Diagnosis not present

## 2015-03-22 DIAGNOSIS — M792 Neuralgia and neuritis, unspecified: Secondary | ICD-10-CM | POA: Diagnosis not present

## 2015-03-22 DIAGNOSIS — E1140 Type 2 diabetes mellitus with diabetic neuropathy, unspecified: Secondary | ICD-10-CM | POA: Diagnosis not present

## 2015-03-22 DIAGNOSIS — G4700 Insomnia, unspecified: Secondary | ICD-10-CM

## 2015-03-22 DIAGNOSIS — K219 Gastro-esophageal reflux disease without esophagitis: Secondary | ICD-10-CM

## 2015-03-22 DIAGNOSIS — N179 Acute kidney failure, unspecified: Secondary | ICD-10-CM | POA: Diagnosis not present

## 2015-03-22 DIAGNOSIS — K3184 Gastroparesis: Secondary | ICD-10-CM | POA: Diagnosis not present

## 2015-03-22 DIAGNOSIS — E1165 Type 2 diabetes mellitus with hyperglycemia: Secondary | ICD-10-CM

## 2015-03-22 NOTE — Progress Notes (Signed)
Patient ID: Samuel [name], male   DOB: 1950-11-06, 64 y.o.   MRN: [id number]    [facility]   PCP: [name], MD  Code Status: Full Code   Allergies  Allergen Reactions  . Codeine     REACTION: GI Upset  . Gabapentin Other (See Comments)    REACTION: rash/hives (per patient, was a reaction to an inactive ingredient in another mgf brand). The patient stated that he does take this medication now and it doesn't cause a rash any more.  . Nabumetone     REACTION: sick on stomach and rash    Chief Complaint  Patient presents with  . New Admit To SNF    New Admission      HPI:  64 y.o. patient is here for short term rehabilitation post hospital admission from 03/07/15- 03/15/15 with sepsis in the setting of staph aureus bacteremia from open wound ulcer with osteomyelitis. 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 His antibiotics were discontinued. He had acute kidney injury. He has medical history of HTN, DM, CAD, gastroparesis among others. He is seen in his room today. His pain is under control with current regimen., his bowel movement is regulated. He would like a sleeping aid. He has intermittent nausea and dry heaves and mentions he has been having these for several years now with his gastroparesis. He feels weak and tired.  Review of Systems:  Constitutional: Negative for fever, chills, diaphoresis.  HENT: Negative for headache, congestion, difficulty swallowing.   Eyes: Negative for eye pain, blurred vision, double vision and discharge.  Respiratory: Negative for cough, shortness of breath and wheezing.   Cardiovascular: Negative for chest pain, palpitations Gastrointestinal: Negative for heartburn, vomiting, abdominal pain Genitourinary: Negative for dysuria, flank pain.  Musculoskeletal: Negative for back pain, falls Skin: Negative for itching, rash.  Neurological: Negative for dizziness, tingling, focal  weakness Psychiatric/Behavioral: Negative for depression   Past Medical History  Diagnosis Date  . Hypertension   . CAD (coronary artery disease)   . Gastroparesis   . GERD (gastroesophageal reflux disease)   . Hyperlipidemia   . Peripheral neuropathy (HCC)   . PONV (postoperative nausea and vomiting)   . Shortness of breath     exertion  . Depression   . Anxiety   . Pulmonary embolism (HCC)     hx. of 2012  . Peripheral vascular disease (HCC)   . OSA (obstructive sleep apnea)     "not bad enough for a mask"  . Type II diabetes mellitus (HCC)   . 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 H/O hiatal hernia   . Arthritis     "all over"   . Charcot's joint     "left foot"   Past Surgical History  Procedure Laterality Date  . Foot surgery Left 2010    "for Charcot's joint"  . Vena cava filter placement  2012  . I&d extremity Left "multiple"    leg  . Im nailing tibia Left ~ 2012  . Toe amputation Right ~ 2011    "great toe"  . Shoulder arthroscopy w/ rotator cuff repair Left     "and bone spurs"  . Application of wound vac Left 01/07/2014  . Wound debridement Left 01/07/2014    "tibia"  . Tibial im rod removal Left 01/07/2014  . Cholecystectomy  06/2010  . Coronary angioplasty with stent placement  05/2009    "  1"  . Hardware removal Left 01/07/2014    Procedure: LEFT LEG REMOVAL OF DEEP IMPLANT AND SEQUESTRECTOMY; APPLICATION OF WOUND VAC ;  Surgeon: Toni Arthurs, MD;  Location: [facility] OR;  Service: Orthopedics;  Laterality: Left;  . I&d extremity Left 01/07/2014    Procedure: 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;  Surgeon: Toni Arthurs, MD;  Location: [facility] OR;  Service: Orthopedics;  Laterality: Left;  . Amputation Left 03/08/2015    Procedure:  LEFT AMPUTATION BELOW KNEE;  Surgeon: Toni Arthurs, MD;  Location: [facility] ORS;  Service: Orthopedics;  Laterality: Left;   Social History:   reports that he has never smoked. He has never used smokeless  tobacco. He reports that he drinks alcohol. He reports that he does not use illicit drugs.  Family History  Problem Relation Age of Onset  . COPD Mother   . Heart disease Mother   . Lung cancer Mother   . Retinal detachment Father   . Alcoholism Brother   . Heart disease Brother   . COPD Brother   . Diabetes Brother   . Hypertension Brother   . Stroke Brother     Medications:   Medication List       This list is accurate as of: 03/22/15 10:56 AM.  Always use your most recent med list.               acetaminophen 325 MG tablet  Commonly known as:  TYLENOL  Take 650 mg by mouth every 6 (six) hours as needed for mild pain.     amLODipine 5 MG tablet  Commonly known as:  NORVASC  Take 5 mg by mouth daily.     COLACE 100 MG capsule  Generic drug:  docusate sodium  Take 100 mg by mouth every evening.     ferrous sulfate 325 (65 FE) MG tablet  Take 325 mg by mouth every evening.     gabapentin 300 MG capsule  Commonly known as:  NEURONTIN  Take 300 mg by mouth 3 (three) times daily.     insulin detemir 100 UNIT/ML injection  Commonly known as:  LEVEMIR  Inject 65 Units into the skin 2 (two) times daily.     insulin lispro 100 UNIT/ML injection  Commonly known as:  HUMALOG  Inject SQ per sliding scale 150-250 (5 units), 251-350(8 units), 351-450(10 units), >450=10 units     INVOKANA 100 MG Tabs tablet  Generic drug:  canagliflozin  Take 100 mg by mouth every evening.     metoCLOPramide 10 MG tablet  Commonly known as:  REGLAN  Take 10 mg by mouth 4 (four) times daily.     naproxen sodium 220 MG tablet  Commonly known as:  ANAPROX  Take 440 mg by mouth 2 (two) times daily as needed (pain).     omeprazole 20 MG capsule  Commonly known as:  PRILOSEC  Take 20 mg by mouth 2 (two) times daily before a meal.     oxyCODONE 5 MG immediate release tablet  Commonly known as:  Oxy IR/ROXICODONE  Take 1 tablet (5 mg total) by mouth every 4 (four) hours as needed for  severe pain.     pioglitazone 30 MG tablet  Commonly known as:  ACTOS  Take 30 mg by mouth at bedtime.     polyethylene glycol packet  Commonly known as:  MIRALAX / GLYCOLAX  Take 17 g by mouth daily.     promethazine 6.25 MG/5ML syrup  Commonly  known as:  PHENERGAN  Take 25 mg by mouth every 6 (six) hours as needed for nausea or vomiting.     ranitidine 150 MG tablet  Commonly known as:  ZANTAC  Take 150 mg by mouth daily as needed for heartburn.     simvastatin 40 MG tablet  Commonly known as:  ZOCOR  Take 60 mg by mouth every evening. Take 1 and 1/2 tablets daily     sitaGLIPtin 100 MG tablet  Commonly known as:  JANUVIA  Take 100 mg by mouth every evening.         Physical Exam: Filed Vitals:   03/22/15 1022  BP: 148/77  Pulse: 82  Temp: 96.9 F (36.1 C)  TempSrc: Oral  Resp: 20  SpO2: 95%    General- adult obese male, in no acute distress Head- normocephalic, atraumatic Nose- normal nasal mucosa, no maxillary or frontal sinus tenderness, no nasal discharge Throat- moist mucus membrane  Eyes- PERRLA, EOMI, no pallor, no icterus, no discharge, normal conjunctiva, normal sclera Neck- no cervical lymphadenopathy Cardiovascular- normal s1,s2, no murmurs, good right dorsalis pedis and both radial pulses, no leg edema Respiratory- bilateral clear to auscultation, no wheeze, no rhonchi, no crackles, no use of accessory muscles Abdomen- bowel sounds present, soft, non tender Musculoskeletal- left BKA, right great toe amputated, able to move other 3 extremities, no spinal and paraspinal tenderness Neurological- no focal deficit, alert and oriented to person, place and time Skin- warm and dry, left BKA site with dressing in place with dressing clean and dry Psychiatry- normal mood and affect    Labs reviewed: Basic Metabolic Panel:  Recent Labs  57/84/69 0435 03/15/15 0612 03/19/15 1043 03/21/15  NA 142 144 139 140  K 3.1* 3.3* 3.4* 3.5  CL 106 107 97*   --   CO2 29 26 33*  --   GLUCOSE 132* 128* 126*  --   BUN CREATININE 1.94* 1.73* 1.97* 1.9*  CALCIUM 8.6* 9.0 8.9  --    Liver Function Tests:  Recent Labs  03/11/15 0505 03/12/15 0530 03/14/15 0435  AST ALT 12* 13* 11*  ALKPHOS 49 49 46  BILITOT 0.7 0.5 0.2*  PROT 6.4* 6.4* 6.6  ALBUMIN 2.2* 2.3* 2.4*    Recent Labs  03/07/15 0411  LIPASE 12*   No results for input(s): AMMONIA in the last 8760 hours. CBC:  Recent Labs  10/06/14 1143  03/07/15 0810  03/12/15 0530 03/14/15 0435 03/19/15 1043 03/21/15  WBC 6.6  < > 7.4  < > 5.4 4.7 5.8 5.0  NEUTROABS 5.4  --  5.1  --   --   --  4.1  --   HGB 14.7  < > 12.6*  < > 11.1* 10.9* 12.0* 11.6*  HCT 44.3  < > 37.4*  < > 35.2* 34.7* 36.5* 37*  MCV 93.7  < > 90.6  < > 94.4 94.0 92.2  --   PLT 250  < > 274  < > 296 309 303 283  < > = values in this interval not displayed. Cardiac Enzymes:  Recent Labs  03/07/15 0810  TROPONINI <0.03   BNP: Invalid input(s): POCBNP CBG:  Recent Labs  03/14/15 2052 03/15/15 0751 03/15/15 1222  GLUCAP 136* 123* 113*   Lab Results  Component Value Date   HGBA1C 7.7* 12/21/2013    Assessment/Plan  Physical deconditioning Will have him work with physical therapy and occupational therapy team to  help with gait training and muscle strengthening exercises.fall precautions. Skin care. Encourage to be out of bed.   Left foot osteomyelitis S/p left BKA. Unsteady gait. Continue wound care and gait training with strengthening exercises. Fall precautions. Continue oxyIR 5 mg q4h prn pain  gastroparesis Nausea at present. With his hx of gastroparesis. He is on naproxen on review of med list which can worsen this and increase risk for gastritis. D/c naproxen. Change reglan to 10 mg four times a day with meals and add phenergan 25 mg q6h prn nausea/ vomiting  Insomnia Start melatonin 5 mg qhs at 9:30 pm and monitor  Constipation Continue colace 100 mg daily,  lactulose 15 ml/10 g daily prn and miralax daily. monitor  Protein calorie malnutrition Continue feeding supplement, monitor weight  Anemia Mix of blood loss anemia post op and anemia of chronic disease. Continue ferrous sulfate 325 mg daily and check cbc  Neuropathy with diabetes Continue gabapentin 300 mg tid  DM Lab Results  Component Value Date   HGBA1C 7.7* 12/21/2013  check a1c. Continue levemir 65 u bid with novolog SSI with meals, monitor cbg. Continue invokana 100 mg daily, januvia 100 mg daily and actos 30 mg daily.   gerd Stable with prevacid 30 mg bid at present and ranitidine prn. Monitor symptoms.  Acute renal failure Monitor bmp, d/c naproxen  Hypertension Monitor bp reading, continue amlodipien 5 mg daily, newly started on 03/16/15   Goals of care: short term rehabilitation   Labs/tests ordered: cbc, bmp, a1c in 1 week  Family/ staff Communication: reviewed care plan with patient and nursing supervisor    Oneal Grout, MD  [facility] [phone] (Monday-Friday 8 am - 5 pm) [phone] (afterhours)

## 2015-03-30 ENCOUNTER — Non-Acute Institutional Stay (SKILLED_NURSING_FACILITY): Payer: Medicare Other | Admitting: Nurse Practitioner

## 2015-03-30 DIAGNOSIS — IMO0002 Reserved for concepts with insufficient information to code with codable children: Secondary | ICD-10-CM

## 2015-03-30 DIAGNOSIS — K5900 Constipation, unspecified: Secondary | ICD-10-CM

## 2015-03-30 DIAGNOSIS — K219 Gastro-esophageal reflux disease without esophagitis: Secondary | ICD-10-CM | POA: Diagnosis not present

## 2015-03-30 DIAGNOSIS — K3184 Gastroparesis: Secondary | ICD-10-CM | POA: Diagnosis not present

## 2015-03-30 DIAGNOSIS — M86172 Other acute osteomyelitis, left ankle and foot: Secondary | ICD-10-CM | POA: Diagnosis not present

## 2015-03-30 DIAGNOSIS — R5381 Other malaise: Secondary | ICD-10-CM

## 2015-03-30 DIAGNOSIS — D649 Anemia, unspecified: Secondary | ICD-10-CM | POA: Diagnosis not present

## 2015-03-30 DIAGNOSIS — Z794 Long term (current) use of insulin: Secondary | ICD-10-CM

## 2015-03-30 DIAGNOSIS — E1140 Type 2 diabetes mellitus with diabetic neuropathy, unspecified: Secondary | ICD-10-CM | POA: Diagnosis not present

## 2015-03-30 DIAGNOSIS — E1165 Type 2 diabetes mellitus with hyperglycemia: Secondary | ICD-10-CM

## 2015-03-30 DIAGNOSIS — I10 Essential (primary) hypertension: Secondary | ICD-10-CM

## 2015-03-30 NOTE — Progress Notes (Signed)
Patient ID: Samuel [name], male   DOB: [birth year], 64 y.o.   MRN: [id number]      Full Code Nursing Home Location:  [facility]   Place of Service:    PCP: [name], MD  Allergies  Allergen Reactions  . Codeine     REACTION: GI Upset  . Gabapentin Other (See Comments)    REACTION: rash/hives (per patient, was a reaction to an inactive ingredient in another mgf brand). The patient stated that he does take this medication now and it doesn't cause a rash any more.  . Nabumetone     REACTION: sick on stomach and rash    Chief Complaint  Patient presents with  . Discharge Note    Discharge    HPI:  Patient is a 64 y.o. male seen today at [facility] for discharge home. Pt with a past medical history of obesity, diabetes mellitus, gastroparesis, nausea, constipation, hyperlipidemia, anemia. Pt also with hx of osteomyelitis of the left leg admitted on 9/19 for sepsis secondary to large open wound of the left foot with confirmed osteomyelitis. 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 Hospitalization complicated by AKI which improved with IV hydration. Pt currently at [facility] for rehab and wound care. Pain is currently under control with current regimen. While at [facility] was added due to hypertension. Blood pressures have improved .Patient currently doing well with therapy, wound is stable and pt stable to discharge home with home health.   Review of Systems:  Review of Systems  Constitutional: Negative for activity change, appetite change, fatigue and unexpected weight change.  HENT: Negative for congestion and hearing loss.   Eyes: Negative.   Respiratory: Negative for cough and shortness of breath.   Cardiovascular: Negative for chest pain, palpitations and leg swelling.  Gastrointestinal: Positive for nausea. Negative for abdominal pain, diarrhea and constipation.    Genitourinary: Negative for dysuria and difficulty urinating.  Musculoskeletal: Negative for myalgias and arthralgias.  Skin: Negative for color change.  Neurological: Negative for dizziness and weakness.       Phantom pain to right LE  Psychiatric/Behavioral: Negative for behavioral problems, confusion and agitation.    Past Medical History  Diagnosis Date  . Hypertension   . CAD (coronary artery disease)   . Gastroparesis   . GERD (gastroesophageal reflux disease)   . Hyperlipidemia   . Peripheral neuropathy (HCC)   . PONV (postoperative nausea and vomiting)   . Shortness of breath     exertion  . Depression   . Anxiety   . Pulmonary embolism (HCC)     hx. of 2012  . Peripheral vascular disease (HCC)   . OSA (obstructive sleep apnea)     "not bad enough for a mask"  . Type II diabetes mellitus (HCC)   . 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 H/O hiatal hernia   . Arthritis     "all over"   . Charcot's joint     "left foot"   Past Surgical History  Procedure Laterality Date  . Foot surgery Left 2010    "for Charcot's joint"  . Vena cava filter placement  2012  . I&d extremity Left "multiple"    leg  . Im nailing tibia Left ~ 2012  . Toe amputation Right ~ 2011    "great toe"  . Shoulder arthroscopy w/ rotator cuff  repair Left     "and bone spurs"  . Application of wound vac Left 01/07/2014  . Wound debridement Left 01/07/2014    "tibia"  . Tibial im rod removal Left 01/07/2014  . Cholecystectomy  06/2010  . Coronary angioplasty with stent placement  05/2009    "1"  . Hardware removal Left 01/07/2014    Procedure: LEFT LEG REMOVAL OF DEEP IMPLANT AND SEQUESTRECTOMY; APPLICATION OF WOUND VAC ;  Surgeon: Toni ArthursJohn Hewitt, MD;  Location: [facility] OR;  Service: Orthopedics;  Laterality: Left;  . I&d extremity Left 01/07/2014    Procedure: 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;  Surgeon: Toni ArthursJohn Hewitt, MD;  Location: [facility] OR;  Service: Orthopedics;   Laterality: Left;  . Amputation Left 03/08/2015    Procedure:  LEFT AMPUTATION BELOW KNEE;  Surgeon: Toni ArthursJohn Hewitt, MD;  Location: [facility] ORS;  Service: Orthopedics;  Laterality: Left;   Social History:   reports that he has never smoked. He has never used smokeless tobacco. He reports that he drinks alcohol. He reports that he does not use illicit drugs.  Family History  Problem Relation Age of Onset  . COPD Mother   . Heart disease Mother   . Lung cancer Mother   . Retinal detachment Father   . Alcoholism Brother   . Heart disease Brother   . COPD Brother   . Diabetes Brother   . Hypertension Brother   . Stroke Brother     Medications: Patient's Medications  New Prescriptions   No medications on file  Previous Medications   ACETAMINOPHEN (TYLENOL) 325 MG TABLET    Take 650 mg by mouth every 6 (six) hours as needed for mild pain.   AMLODIPINE (NORVASC) 5 MG TABLET    Take one tablet by mouth once daily for hypertension   CANAGLIFLOZIN (INVOKANA) 100 MG TABS    Take 100 mg by mouth every evening.    DOCUSATE SODIUM (COLACE) 100 MG CAPSULE    Take 100 mg by mouth every evening.    FERROUS SULFATE 325 (65 FE) MG TABLET    Take 325 mg by mouth every evening.    GABAPENTIN (NEURONTIN) 300 MG CAPSULE    Take 300 mg by mouth 3 (three) times daily.   INSULIN DETEMIR (LEVEMIR) 100 UNIT/ML INJECTION    Inject 65 Units into the skin 2 (two) times daily.    INSULIN LISPRO (HUMALOG) 100 UNIT/ML INJECTION    Inject SQ per sliding scale 150-250 (5 units), 251-350(8 units), 351-450(10 units), >450=10 units   MELATONIN 5 MG TABS    Take one tablet by mouth at bedtime at 9:30pm for insomnia   METOCLOPRAMIDE (REGLAN) 10 MG TABLET    Take 10 mg by mouth 4 (four) times daily.   NYSTATIN-TRIAMCINOLONE OINTMENT (MYCOLOG)    Clean and dry area well, apply ointment topically after bathing twice daily   OMEPRAZOLE (PRILOSEC) 20 MG CAPSULE    Take 20 mg by mouth 2 (two) times daily before a meal.   OXYCODONE  (OXY IR/ROXICODONE) 5 MG IMMEDIATE RELEASE TABLET    Take 1 tablet (5 mg total) by mouth every 4 (four) hours as needed for severe pain.   PIOGLITAZONE (ACTOS) 30 MG TABLET    Take 30 mg by mouth at bedtime.    PODIATRIC PRODUCTS (FLEXITOL HEEL BALM EX)    Apply topically to right foot twice daily for dry skin   POLYETHYLENE GLYCOL (MIRALAX / GLYCOLAX) PACKET    Take 17 g by mouth  daily.   PROMETHAZINE (PHENERGAN) 25 MG TABLET    Take one tablet by mouth every 8 hours as needed nausea/vomiting   RANITIDINE (ZANTAC) 150 MG TABLET    Take 150 mg by mouth daily as needed for heartburn.    SIMVASTATIN (ZOCOR) 40 MG TABLET    Take one and half tablet by mouth once daily in the evening for cholesterol   SITAGLIPTIN (JANUVIA) 100 MG TABLET    Take one tablet by mouth every evening to control blood sugar   ZINC OXIDE (BALMEX) 11.3 % CREA CREAM    Apply topically to the buttock twice daily for blanchable redness  Modified Medications   No medications on file  Discontinued Medications   NAPROXEN SODIUM (ANAPROX) 220 MG TABLET    Take 440 mg by mouth 2 (two) times daily as needed (pain).    PROMETHAZINE (PHENERGAN) 6.25 MG/5ML SYRUP    Take 25 mg by mouth every 6 (six) hours as needed for nausea or vomiting.     Physical Exam: Filed Vitals:   03/30/15 1135  BP: 147/90  Pulse: 93  Temp: 97.6 F (36.4 C)  TempSrc: Oral  Resp: 20  Height: 6\' 3"  (1.905 m)  Weight: 286 lb (129.729 kg)    Physical Exam  Constitutional: He is oriented to person, place, and time. He appears well-developed and well-nourished. No distress.  HENT:  Head: Normocephalic and atraumatic.  Mouth/Throat: Oropharynx is clear and moist. No oropharyngeal exudate.  Eyes: Conjunctivae and EOM are normal. Pupils are equal, round, and reactive to light.  Neck: Normal range of motion. Neck supple.  Cardiovascular: Normal rate, regular rhythm and normal heart sounds.   Pulmonary/Chest: Effort normal and breath sounds normal.    Abdominal: Soft. Bowel sounds are normal.  Musculoskeletal: He exhibits no edema or tenderness.  Left BKA, staples intact, well healing incision   Neurological: He is alert and oriented to person, place, and time.  Skin: Skin is warm and dry. He is not diaphoretic.  Psychiatric: He has a normal mood and affect.    Labs reviewed: Basic Metabolic Panel:  Recent Labs  16/10/96 0435 03/15/15 0612 03/19/15 1043 03/21/15  NA 142 144 139 140  K 3.1* 3.3* 3.4* 3.5  CL 106 107 97*  --   CO2 29 26 33*  --   GLUCOSE 132* 128* 126*  --   BUN 10 8 20 20   CREATININE 1.94* 1.73* 1.97* 1.9*  CALCIUM 8.6* 9.0 8.9  --    Liver Function Tests:  Recent Labs  03/11/15 0505 03/12/15 0530 03/14/15 0435  AST 27 24 20   ALT 12* 13* 11*  ALKPHOS 49 49 46  BILITOT 0.7 0.5 0.2*  PROT 6.4* 6.4* 6.6  ALBUMIN 2.2* 2.3* 2.4*    Recent Labs  03/07/15 0411  LIPASE 12*   No results for input(s): AMMONIA in the last 8760 hours. CBC:  Recent Labs  10/06/14 1143  03/07/15 0810  03/12/15 0530 03/14/15 0435 03/19/15 1043 03/21/15  WBC 6.6  < > 7.4  < > 5.4 4.7 5.8 5.0  NEUTROABS 5.4  --  5.1  --   --   --  4.1  --   HGB 14.7  < > 12.6*  < > 11.1* 10.9* 12.0* 11.6*  HCT 44.3  < > 37.4*  < > 35.2* 34.7* 36.5* 37*  MCV 93.7  < > 90.6  < > 94.4 94.0 92.2  --   PLT 250  < > 274  < >  296 309 303 283  < > = values in this interval not displayed. TSH: No results for input(s): TSH in the last 8760 hours. A1C: Lab Results  Component Value Date   HGBA1C 7.7* 12/21/2013   Lipid Panel: No results for input(s): CHOL, HDL, LDLCALC, TRIG, CHOLHDL, LDLDIRECT in the last 8760 hours.  Radiological Exams: Dg Abd Acute W/chest  03/07/2015  CLINICAL DATA:  Two week history nausea, vomiting, and diarrhea. EXAM: DG ABDOMEN ACUTE W/ 1V CHEST COMPARISON:  Chest 01/05/2014 FINDINGS: Shallow inspiration with linear atelectasis in the lung bases. Heart size and pulmonary vascularity are normal. No focal  airspace disease in the lungs. Scattered gas and stool in the colon. No small or large bowel distention. No free intra-abdominal air. No abnormal air-fluid levels. Surgical clips in the right upper quadrant. Degenerative changes in the spine. No radiopaque stones. IMPRESSION: Shallow inspiration with linear atelectasis in the lung bases. Nonobstructive bowel gas pattern. Electronically Signed   By: Burman Nieves M.D.   On: 03/07/2015 06:13   Dg Foot Complete Left  03/07/2015  CLINICAL DATA:  Wound. Obvious wounds and odor to the left foot. History of diabetes. EXAM: LEFT FOOT - COMPLETE 3+ VIEW COMPARISON:  12/26/2009 FINDINGS: Since previous study, there has been interval removal of surgical fixation devices. There appear to be a postoperative changes with osteotomies and effusion at the first, second, and possibly third tarsometatarsal joints. Few segments appear intact. No evidence of acute fracture or dislocation. Soft tissue swelling about the first metatarsal-phalangeal joint. There is lucency in the bone at the first metatarsal head, new since prior study. This may indicate evidence of osteomyelitis. No soft tissue gas collections demonstrated. Prominent vascular calcification. IMPRESSION: Postoperative fusion of the first through third tarsometatarsal joints. Degenerative changes. Soft tissue changes of the first metatarsal phalangeal joint with apparent lucency in the first metatarsal head suggest probable osteomyelitis. Electronically Signed   By: Burman Nieves M.D.   On: 03/07/2015 04:28    Assessment/Plan 1. Acute osteomyelitis of left foot (HCC) S/p left BKA. Will need to  Continue wound care and gait training with strengthening exercises as outpatient with therapy. Pain controlled on oxyIR 5 mg q4h prn pain Cont follow up with ortho.   2. Gastroparesis - ongoing, conts with reglan 10 mg QID and phenergan as needed  3. Essential hypertension -stable at this time, conts on norvasc  5 mg daily   4. Uncontrolled type 2 diabetes mellitus with diabetic neuropathy, with long-term current use of insulin (HCC) Continue levemir 65 u bid with novolog SSI with meals. Continue invokana 100 mg daily, januvia 100 mg daily and actos 30 mg daily.   5. Gastroesophageal reflux disease, esophagitis presence not specified -stable, cont with prevacid 30 mg BID   6. Anemia, normocytic normochromic Stable, conts on iron daily  7. Constipation, unspecified constipation type Controlled, cont current regimen  8. Physical deconditioning -has improved but still will need ongoing therapy.  -pt is stable for discharge-will need PT/OT/HHA per home health. DME needed includes WC and 3n1. Rx written.  will need to follow up with PCP within 2 weeks.     Janene Harvey. Biagio Borg  [facility] [phone] 8 am - 5 pm) [phone] (after hours)

## 2015-04-03 ENCOUNTER — Emergency Department (HOSPITAL_COMMUNITY)
Admission: EM | Admit: 2015-04-03 | Discharge: 2015-04-04 | Disposition: A | Discharge status: Home / Self Care | Payer: Medicare Other | Attending: Emergency Medicine | Admitting: Emergency Medicine

## 2015-04-03 ENCOUNTER — Encounter (HOSPITAL_COMMUNITY): Payer: Self-pay | Admitting: Emergency Medicine

## 2015-04-03 DIAGNOSIS — Z794 Long term (current) use of insulin: Secondary | ICD-10-CM | POA: Insufficient documentation

## 2015-04-03 DIAGNOSIS — I10 Essential (primary) hypertension: Secondary | ICD-10-CM | POA: Insufficient documentation

## 2015-04-03 DIAGNOSIS — I2510 Atherosclerotic heart disease of native coronary artery without angina pectoris: Secondary | ICD-10-CM | POA: Diagnosis not present

## 2015-04-03 DIAGNOSIS — M1990 Unspecified osteoarthritis, unspecified site: Secondary | ICD-10-CM | POA: Diagnosis not present

## 2015-04-03 DIAGNOSIS — E119 Type 2 diabetes mellitus without complications: Secondary | ICD-10-CM | POA: Insufficient documentation

## 2015-04-03 DIAGNOSIS — K219 Gastro-esophageal reflux disease without esophagitis: Secondary | ICD-10-CM | POA: Diagnosis not present

## 2015-04-03 DIAGNOSIS — K3184 Gastroparesis: Secondary | ICD-10-CM | POA: Diagnosis not present

## 2015-04-03 DIAGNOSIS — Z79899 Other long term (current) drug therapy: Secondary | ICD-10-CM | POA: Diagnosis not present

## 2015-04-03 DIAGNOSIS — E785 Hyperlipidemia, unspecified: Secondary | ICD-10-CM | POA: Diagnosis not present

## 2015-04-03 DIAGNOSIS — R109 Unspecified abdominal pain: Secondary | ICD-10-CM | POA: Diagnosis present

## 2015-04-03 DIAGNOSIS — F329 Major depressive disorder, single episode, unspecified: Secondary | ICD-10-CM | POA: Insufficient documentation

## 2015-04-03 DIAGNOSIS — F419 Anxiety disorder, unspecified: Secondary | ICD-10-CM | POA: Diagnosis not present

## 2015-04-03 DIAGNOSIS — Z86711 Personal history of pulmonary embolism: Secondary | ICD-10-CM | POA: Insufficient documentation

## 2015-04-03 LAB — COMPREHENSIVE METABOLIC PANEL
ALBUMIN: 3.6 g/dL (ref 3.5–5.0)
ALT: 20 U/L (ref 17–63)
AST: 42 U/L — AB (ref 15–41)
Alkaline Phosphatase: 61 U/L (ref 38–126)
Anion gap: 13 (ref 5–15)
BUN: 22 mg/dL — AB (ref 6–20)
CHLORIDE: 99 mmol/L — AB (ref 101–111)
CO2: 22 mmol/L (ref 22–32)
Calcium: 9.6 mg/dL (ref 8.9–10.3)
Creatinine, Ser: 1.17 mg/dL (ref 0.61–1.24)
GFR calc Af Amer: >60 mL/min (ref >60)
Glucose, Bld: 113 mg/dL — ABNORMAL HIGH (ref 65–99)
Potassium: 5 mmol/L (ref 3.5–5.1)
SODIUM: 134 mmol/L — AB (ref 135–145)
Total Bilirubin: 2.1 mg/dL — ABNORMAL HIGH (ref 0.3–1.2)
Total Protein: 7.6 g/dL (ref 6.5–8.1)

## 2015-04-03 LAB — URINALYSIS, ROUTINE W REFLEX MICROSCOPIC
Glucose, UA: >1000 mg/dL — AB
Hgb urine dipstick: NEGATIVE
Ketones, ur: 15 mg/dL — AB
Leukocytes, UA: NEGATIVE
Nitrite: NEGATIVE
PH: 6 (ref 5.0–8.0)
Protein, ur: 30 mg/dL — AB
SPECIFIC GRAVITY, URINE: 1.025 (ref 1.005–1.030)
Urobilinogen, UA: 1 mg/dL (ref 0.0–1.0)

## 2015-04-03 LAB — URINE MICROSCOPIC-ADD ON

## 2015-04-03 LAB — I-STAT CHEM 8, ED
BUN: 23 mg/dL — ABNORMAL HIGH (ref 6–20)
Calcium, Ion: 1.15 mmol/L (ref 1.13–1.30)
Chloride: 103 mmol/L (ref 101–111)
Creatinine, Ser: 1 mg/dL (ref 0.61–1.24)
Glucose, Bld: 107 mg/dL — ABNORMAL HIGH (ref 65–99)
HCT: 42 % (ref 39.0–52.0)
Hemoglobin: 14.3 g/dL (ref 13.0–17.0)
Potassium: 3.9 mmol/L (ref 3.5–5.1)
Sodium: 139 mmol/L (ref 135–145)
TCO2: 21 mmol/L (ref 0–100)

## 2015-04-03 LAB — CBC
HEMATOCRIT: 40.7 % (ref 39.0–52.0)
Hemoglobin: 13.4 g/dL (ref 13.0–17.0)
MCH: 29.8 pg (ref 26.0–34.0)
MCHC: 32.9 g/dL (ref 30.0–36.0)
MCV: 90.6 fL (ref 78.0–100.0)
Platelets: 233 10*3/uL (ref 150–400)
RBC: 4.49 MIL/uL (ref 4.22–5.81)
RDW: 13.5 % (ref 11.5–15.5)
WBC: 6.5 10*3/uL (ref 4.0–10.5)

## 2015-04-03 LAB — LIPASE, BLOOD: LIPASE: 22 U/L (ref 22–51)

## 2015-04-03 MED ORDER — SODIUM CHLORIDE 0.9 % IV BOLUS (SEPSIS)
1000.0000 mL | Freq: Once | INTRAVENOUS | Status: AC
  Administered 2015-04-03: 1000 mL via INTRAVENOUS

## 2015-04-03 MED ORDER — PANTOPRAZOLE SODIUM 40 MG IV SOLR
40.0000 mg | Freq: Once | INTRAVENOUS | Status: AC
  Administered 2015-04-03: 40 mg via INTRAVENOUS
  Filled 2015-04-03: qty 40

## 2015-04-03 MED ORDER — PROMETHAZINE HCL 25 MG/ML IJ SOLN
25.0000 mg | Freq: Four times a day (QID) | INTRAMUSCULAR | Status: DC | PRN
  Filled 2015-04-03: qty 1

## 2015-04-03 MED ORDER — ONDANSETRON HCL 4 MG/2ML IJ SOLN: 4.0000 mg | Freq: Once | INTRAMUSCULAR | Status: DC

## 2015-04-03 MED ORDER — ONDANSETRON HCL 4 MG/2ML IJ SOLN
4.0000 mg | Freq: Once | INTRAMUSCULAR | Status: AC
  Administered 2015-04-03: 4 mg via INTRAVENOUS
  Filled 2015-04-03: qty 2

## 2015-04-03 MED ORDER — PROMETHAZINE HCL 25 MG/ML IJ SOLN
25.0000 mg | Freq: Once | INTRAMUSCULAR | Status: AC
  Administered 2015-04-03: 25 mg via INTRAVENOUS
  Filled 2015-04-03: qty 1

## 2015-04-03 MED ORDER — PROMETHAZINE HCL 25 MG RE SUPP
25.0000 mg | Freq: Four times a day (QID) | RECTAL | Status: DC | PRN
  Filled 2015-04-03: qty 1

## 2015-04-03 NOTE — ED Notes (Signed)
Pt arrives c/o chronic nausea, stating that he thinks he has gastroparesis, states he has this all the time. Cousin at bedside states pt has had a rough night ed/t nausea, "feeling bad"

## 2015-04-03 NOTE — Discharge Instructions (Signed)
Gastroparesis °Gastroparesis, also called delayed gastric emptying, is a condition in which food takes longer than normal to empty from the stomach. The condition is usually long-lasting (chronic). °CAUSES °This condition may be caused by: °· An endocrine disorder, such as hypothyroidism or diabetes. Diabetes is the most common cause of this condition. °· A nervous system disease, such as Parkinson disease or multiple sclerosis. °· Cancer, infection, or surgery of the stomach or vagus nerve. °· A connective tissue disorder, such as scleroderma. °· Certain medicines. °In most cases, the cause is not known. °RISK FACTORS °This condition is more likely to develop in: °· People with certain disorders, including endocrine disorders, eating disorders, amyloidosis, and scleroderma. °· People with certain diseases, including Parkinson disease or multiple sclerosis. °· People with cancer or infection of the stomach or vagus nerve. °· People who have had surgery on the stomach or vagus nerve. °· People who take certain medicines. °· Women. °SYMPTOMS °Symptoms of this condition include: °· An early feeling of fullness when eating. °· Nausea. °· Weight loss. °· Vomiting. °· Heartburn. °· Abdominal bloating. °· Inconsistent blood glucose levels. °· Lack of appetite. °· Acid from the stomach coming up into the esophagus (gastroesophageal reflux). °· Spasms of the stomach. °Symptoms may come and go. °DIAGNOSIS °This condition is diagnosed with tests, such as: °· Tests that check how long it takes food to move through the stomach and intestines. These tests include: °¨ Upper gastrointestinal (GI) series. In this test, X-rays of the intestines are taken after you drink a liquid. The liquid makes the intestines show up better on the X-rays. °¨ Gastric emptying scintigraphy. In this test, scans are taken after you eat food that contains a small amount of radioactive material. °¨ Wireless capsule GI monitoring system. This test  involves swallowing a capsule that records information about movement through the stomach. °· Gastric manometry. This test measures electrical and muscular activity in the stomach. It is done with a thin tube that is passed down the throat and into the stomach. °· Endoscopy. This test checks for abnormalities in the lining of the stomach. It is done with a long, thin tube that is passed down the throat and into the stomach. °· An ultrasound. This test can help rule out gallbladder disease or pancreatitis as a cause of your symptoms. It uses sound waves to take pictures of the inside of your body. °TREATMENT °There is no cure for gastroparesis. This condition may be managed with: °· Treatment of the underlying condition causing the gastroparesis. °· Lifestyle changes, including exercise and dietary changes. Dietary changes can include: °¨ Changes in what and when you eat. °¨ Eating smaller meals more often. °¨ Eating low-fat foods. °¨ Eating low-fiber forms of high-fiber foods, such as cooked vegetables instead of raw vegetables. °¨ Having liquid foods in place of solid foods. Liquid foods are easier to digest. °· Medicines. These may be given to control nausea and vomiting and to stimulate stomach muscles. °· Getting food through a feeding tube. This may be done in severe cases. °· A gastric neurostimulator. This is a device that is inserted into the body with surgery. It helps improve stomach emptying and control nausea and vomiting. °HOME CARE INSTRUCTIONS °· Follow your health care provider's instructions about exercise and diet. °· Take medicines only as directed by your health care provider. °SEEK MEDICAL CARE IF: °· Your symptoms do not improve with treatment. °· You have new symptoms. °SEEK IMMEDIATE MEDICAL CARE IF: °· You have   severe abdominal pain that does not improve with treatment.  You have nausea that does not go away.  You cannot keep fluids down.   This information is not intended to replace  advice given to you by your health care provider. Make sure you discuss any questions you have with your health care provider.   FOLLOW UP WITH PCP IF SYMPTOMS DO NOT IMPROVE. RETURN TO THE EMERGENCY DEPARTMENT IF YOU HAVE WORSENING OF YOUR SYMPTOMS, VOMITING, ABDOMINAL PAIN.

## 2015-04-03 NOTE — ED Notes (Signed)
Pt requested a diet ginger ale and pt was given the same

## 2015-04-03 NOTE — ED Notes (Signed)
Pt is aware urine is needed, urinal at bedside.  

## 2015-04-04 NOTE — ED Notes (Signed)
[facility] CALLED @ 0001.

## 2015-04-04 NOTE — ED Notes (Signed)
[facility] called to transport pt home 

## 2015-04-04 NOTE — ED Provider Notes (Signed)
CSN: [id number]     Arrival date & time 04/03/15  1927 History   First MD Initiated Contact with Patient 04/03/15 2003     Chief Complaint  Patient presents with  . 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 Patient states that he has chronic nausea and vomiting that he takes Phenergan for at home. However today and yesterday his symptoms were unrelieved by home medications. Patient has epigastric tenderness from continued vomiting. Patient states that this feels like his gastroparesis flare. Patient has not seen gastroenterology in quite some time. Denies fever, chills, diarrhea, constipation, melena, hematochezia, hematemesis, headache, blurry vision, dysuria, chest pain, shortness of breath. Past Medical History  Diagnosis Date  . Hypertension   . CAD (coronary artery disease)   . Gastroparesis   . GERD (gastroesophageal reflux disease)   . Hyperlipidemia   . Peripheral neuropathy (HCC)   . PONV (postoperative nausea and vomiting)   . Shortness of breath     exertion  . Depression   . Anxiety   . Pulmonary embolism (HCC)     hx. of 2012  . Peripheral vascular disease (HCC)   . OSA (obstructive sleep apnea)     "not bad enough for a mask"  . Type II diabetes mellitus (HCC)   . 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 H/O hiatal hernia   . Arthritis     "all over"   . Charcot's joint     "left foot"   Past Surgical History  Procedure Laterality Date  . Foot surgery Left 2010    "for Charcot's joint"  . Vena cava filter placement  2012  . I&d extremity Left "multiple"    leg  . Im nailing tibia Left ~ 2012  . Toe amputation Right ~ 2011    "great toe"  . Shoulder arthroscopy w/ rotator  cuff repair Left     "and bone spurs"  . Application of wound vac Left 01/07/2014  . Wound debridement Left 01/07/2014    "tibia"  . Tibial im rod removal Left 01/07/2014  . Cholecystectomy  06/2010  . Coronary angioplasty with stent placement  05/2009    "1"  . Hardware removal Left 01/07/2014    Procedure: LEFT LEG REMOVAL OF DEEP IMPLANT AND SEQUESTRECTOMY; APPLICATION OF WOUND VAC ;  Surgeon: Toni Arthurs, MD;  Location: [facility];  Service: Orthopedics;  Laterality: Left;  . I&d extremity Left 01/07/2014    Procedure: 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;  Surgeon: Toni Arthurs, MD;  Location: [facility] OR;  Service: Orthopedics;  Laterality: Left;  . Amputation Left 03/08/2015    Procedure:  LEFT AMPUTATION BELOW KNEE;  Surgeon: Toni Arthurs, MD;  Location: [facility];  Service: Orthopedics;  Laterality: Left;   Family History  Problem Relation Age of Onset  . COPD Mother   . Heart disease Mother   . Lung cancer Mother   . Retinal detachment Father   . Alcoholism Brother   . Heart disease Brother   . COPD Brother   . Diabetes Brother   . Hypertension Brother   . Stroke Brother    Social History  Substance Use Topics  . Smoking status: Never Smoker   . Smokeless tobacco: Never Used  .  Alcohol Use: Yes     Comment: 01/07/2014 'might have a wine cooler a couple times/yr"    Review of Systems  All other systems reviewed and are negative.     Allergies  Codeine; Gabapentin; and Nabumetone  Home Medications   Prior to Admission medications   Medication Sig Start Date End Date Taking? Authorizing Provider  acetaminophen (TYLENOL) 325 MG tablet Take 650 mg by mouth every 6 (six) hours as needed for mild pain.   Yes Historical Provider, MD  amLODipine (NORVASC) 5 MG tablet Take 5 mg by mouth daily. Take one tablet by mouth once daily for hypertension   Yes Historical Provider, MD  Canagliflozin (INVOKANA) 100 MG TABS Take 100 mg by mouth every evening.    Yes Historical Provider,  MD  diphenhydramine-acetaminophen (TYLENOL PM) 25-500 MG TABS tablet Take 1 tablet by mouth at bedtime.   Yes Historical Provider, MD  docusate sodium (COLACE) 100 MG capsule Take 100 mg by mouth every evening.    Yes Historical Provider, MD  ferrous sulfate 325 (65 FE) MG tablet Take 325 mg by mouth every evening.    Yes Historical Provider, MD  gabapentin (NEURONTIN) 300 MG capsule Take 300 mg by mouth 3 (three) times daily.   Yes Historical Provider, MD  insulin detemir (LEVEMIR) 100 UNIT/ML injection Inject 65 Units into the skin 2 (two) times daily.    Yes Historical Provider, MD  insulin lispro (HUMALOG) 100 UNIT/ML injection Inject 0-10 Units into the skin 3 (three) times daily with meals. Inject SQ per sliding scale 150-250 (5 units), 251-350(8 units), 351-450(10 units), >450=10 units   Yes Historical Provider, MD  Melatonin 5 MG TABS Take 5 mg by mouth at bedtime.    Yes Historical Provider, MD  metoCLOPramide (REGLAN) 10 MG tablet Take 10 mg by mouth 4 (four) times daily.   Yes Historical Provider, MD  nystatin-triamcinolone ointment (MYCOLOG) Apply 1 application topically 2 (two) times daily. Clean and dry area well, apply ointment topically after bathing twice daily   Yes Historical Provider, MD  omeprazole (PRILOSEC) 20 MG capsule Take 20 mg by mouth 2 (two) times daily before a meal.   Yes Historical Provider, MD  oxyCODONE (OXY IR/ROXICODONE) 5 MG immediate release tablet Take 1 tablet (5 mg total) by mouth every 4 (four) hours as needed for severe pain. 03/15/15  Yes Hollice Espy, MD  pantoprazole (PROTONIX) 40 MG tablet Take 40 mg by mouth 2 (two) times daily before a meal.   Yes Historical Provider, MD  pioglitazone (ACTOS) 30 MG tablet Take 30 mg by mouth at bedtime.    Yes Historical Provider, MD  polyethylene glycol (MIRALAX / GLYCOLAX) packet Take 17 g by mouth daily. Patient taking differently: Take 17 g by mouth daily as needed for moderate constipation.  03/10/15  Yes  Hollice Espy, MD  promethazine (PHENERGAN) 25 MG tablet Take 25 mg by mouth every 8 (eight) hours as needed for nausea or vomiting. Take one tablet by mouth every 8 hours as needed nausea/vomiting   Yes Historical Provider, MD  ranitidine (ZANTAC) 150 MG tablet Take 150 mg by mouth daily as needed for heartburn.    Yes Historical Provider, MD  simvastatin (ZOCOR) 40 MG tablet Take 60 mg by mouth daily at 6 PM. Take one and half tablet by mouth once daily in the evening for cholesterol   Yes Historical Provider, MD  zinc oxide (BALMEX) 11.3 % CREA cream Apply topically to the buttock twice  daily for blanchable redness   Yes Historical Provider, MD   BP 154/76 mmHg  Pulse 98  Temp(Src) 97.5 F (36.4 C) (Oral)  Resp 22  SpO2 97% Physical Exam  Constitutional: He is oriented to person, place, and time. He appears well-developed and well-nourished. No distress.  HENT:  Head: Normocephalic and atraumatic.  Mouth/Throat: No oropharyngeal exudate.  Eyes: Conjunctivae and EOM are normal. Pupils are equal, round, and reactive to light. Right eye exhibits no discharge. Left eye exhibits no discharge. No scleral icterus.  Cardiovascular: Normal rate, regular rhythm, normal heart sounds and intact distal pulses.  Exam reveals no gallop and no friction rub.   No murmur heard. Pulmonary/Chest: Effort normal and breath sounds normal. No respiratory distress. He has no wheezes. He has no rales. He exhibits no tenderness.  Abdominal: Soft. He exhibits no distension. There is tenderness ( mild epigastric TTP ). There is no guarding.  Musculoskeletal: Normal range of motion. He exhibits no edema.  Neurological: He is alert and oriented to person, place, and time.  Skin: Skin is warm and dry. No rash noted. He is not diaphoretic. No erythema. No pallor.  Nursing note and vitals reviewed.   ED Course  Procedures (including critical care time) Labs Review Labs Reviewed  COMPREHENSIVE METABOLIC PANEL -  Abnormal; Notable for the following:    Sodium 134 (*)    Chloride 99 (*)    Glucose, Bld 113 (*)    BUN 22 (*)    AST 42 (*)    Total Bilirubin 2.1 (*)    All other components within normal limits  URINALYSIS, ROUTINE W REFLEX MICROSCOPIC (NOT AT [facility]) - Abnormal; Notable for the following:    Glucose, UA >1000 (*)    Bilirubin Urine SMALL (*)    Ketones, ur 15 (*)    Protein, ur 30 (*)    All other components within normal limits  I-STAT CHEM 8, ED - Abnormal; Notable for the following:    BUN 23 (*)    Glucose, Bld 107 (*)    All other components within normal limits  LIPASE, BLOOD  CBC  URINE MICROSCOPIC-ADD ON    Imaging Review No results found. I have personally reviewed and evaluated these images and lab results as part of my medical decision-making.   EKG Interpretation None      MDM   Final diagnoses:  Gastroparesis    Pt with extensive history of gastroparesis, chronic nausea and vomiting. Presents today with same. Patient tried home oral Phenergan with no relief. Patient given IV Phenergan, fluids, Protonix. Patient feeling much better after treatment. All labs within normal limits. Do not recommend further CT imaging at this time.  Patient is ready for discharge. Patient tolerated fluid challenge without nausea or vomiting. Vital signs stable. Return precautions outlined in patient discharge instructions [facility] for further evaluation of gastroparesis and med management.    Lester KinsmanSamantha Tripp StarkeDowless, PA-C 04/04/15 1607  Rolan BuccoMelanie Belfi, MD 04/04/15 1630

## 2015-04-05 ENCOUNTER — Emergency Department (HOSPITAL_COMMUNITY)
Admission: EM | Admit: 2015-04-05 | Discharge: 2015-04-05 | Disposition: A | Discharge status: Home / Self Care | Payer: Medicare Other | Attending: Emergency Medicine | Admitting: Emergency Medicine

## 2015-04-05 ENCOUNTER — Encounter (HOSPITAL_COMMUNITY): Payer: Self-pay | Admitting: *Deleted

## 2015-04-05 DIAGNOSIS — F329 Major depressive disorder, single episode, unspecified: Secondary | ICD-10-CM | POA: Diagnosis not present

## 2015-04-05 DIAGNOSIS — Z794 Long term (current) use of insulin: Secondary | ICD-10-CM | POA: Insufficient documentation

## 2015-04-05 DIAGNOSIS — E119 Type 2 diabetes mellitus without complications: Secondary | ICD-10-CM | POA: Insufficient documentation

## 2015-04-05 DIAGNOSIS — E785 Hyperlipidemia, unspecified: Secondary | ICD-10-CM | POA: Diagnosis not present

## 2015-04-05 DIAGNOSIS — F419 Anxiety disorder, unspecified: Secondary | ICD-10-CM | POA: Insufficient documentation

## 2015-04-05 DIAGNOSIS — K219 Gastro-esophageal reflux disease without esophagitis: Secondary | ICD-10-CM | POA: Diagnosis not present

## 2015-04-05 DIAGNOSIS — M158 Other polyosteoarthritis: Secondary | ICD-10-CM | POA: Insufficient documentation

## 2015-04-05 DIAGNOSIS — Z79899 Other long term (current) drug therapy: Secondary | ICD-10-CM | POA: Diagnosis not present

## 2015-04-05 DIAGNOSIS — Z86711 Personal history of pulmonary embolism: Secondary | ICD-10-CM | POA: Diagnosis not present

## 2015-04-05 DIAGNOSIS — I2510 Atherosclerotic heart disease of native coronary artery without angina pectoris: Secondary | ICD-10-CM | POA: Insufficient documentation

## 2015-04-05 DIAGNOSIS — I10 Essential (primary) hypertension: Secondary | ICD-10-CM | POA: Diagnosis not present

## 2015-04-05 DIAGNOSIS — K3184 Gastroparesis: Secondary | ICD-10-CM | POA: Insufficient documentation

## 2015-04-05 DIAGNOSIS — G629 Polyneuropathy, unspecified: Secondary | ICD-10-CM | POA: Diagnosis not present

## 2015-04-05 DIAGNOSIS — R112 Nausea with vomiting, unspecified: Secondary | ICD-10-CM

## 2015-04-05 LAB — URINE MICROSCOPIC-ADD ON

## 2015-04-05 LAB — LIPASE, BLOOD: Lipase: 19 U/L — ABNORMAL LOW (ref 22–51)

## 2015-04-05 LAB — CBC
HCT: 40 % (ref 39.0–52.0)
Hemoglobin: 13 g/dL (ref 13.0–17.0)
MCH: 29.7 pg (ref 26.0–34.0)
MCHC: 32.5 g/dL (ref 30.0–36.0)
MCV: 91.5 fL (ref 78.0–100.0)
PLATELETS: 202 10*3/uL (ref 150–400)
RBC: 4.37 MIL/uL (ref 4.22–5.81)
RDW: 13.5 % (ref 11.5–15.5)
WBC: 5.5 10*3/uL (ref 4.0–10.5)

## 2015-04-05 LAB — URINALYSIS, ROUTINE W REFLEX MICROSCOPIC
Hgb urine dipstick: NEGATIVE
KETONES UR: NEGATIVE mg/dL
LEUKOCYTES UA: NEGATIVE
Nitrite: NEGATIVE
PROTEIN: 30 mg/dL — AB
Specific Gravity, Urine: 1.027 (ref 1.005–1.030)
Urobilinogen, UA: 1 mg/dL (ref 0.0–1.0)
pH: 5.5 (ref 5.0–8.0)

## 2015-04-05 LAB — COMPREHENSIVE METABOLIC PANEL
ALK PHOS: 62 U/L (ref 38–126)
ALT: 23 U/L (ref 17–63)
AST: 28 U/L (ref 15–41)
Albumin: 3.8 g/dL (ref 3.5–5.0)
Anion gap: 8 (ref 5–15)
BILIRUBIN TOTAL: 0.9 mg/dL (ref 0.3–1.2)
BUN: 20 mg/dL (ref 6–20)
CALCIUM: 9.3 mg/dL (ref 8.9–10.3)
CO2: 25 mmol/L (ref 22–32)
CREATININE: 1.08 mg/dL (ref 0.61–1.24)
Chloride: 103 mmol/L (ref 101–111)
Glucose, Bld: 102 mg/dL — ABNORMAL HIGH (ref 65–99)
Potassium: 3.7 mmol/L (ref 3.5–5.1)
Sodium: 136 mmol/L (ref 135–145)
Total Protein: 8 g/dL (ref 6.5–8.1)

## 2015-04-05 MED ORDER — PROMETHAZINE HCL 25 MG/ML IJ SOLN
25.0000 mg | Freq: Once | INTRAMUSCULAR | Status: AC
  Administered 2015-04-05: 25 mg via INTRAVENOUS
  Filled 2015-04-05: qty 1

## 2015-04-05 MED ORDER — SODIUM CHLORIDE 0.9 % IV BOLUS (SEPSIS)
1000.0000 mL | Freq: Once | INTRAVENOUS | Status: AC
  Administered 2015-04-05: 1000 mL via INTRAVENOUS

## 2015-04-05 NOTE — ED Notes (Signed)
AVS explained in detail, knows to follow up with PCP in 4 days. No other c/c. A&Ox4. Knows to take Phenergan home prescription.

## 2015-04-05 NOTE — Discharge Instructions (Signed)
Continue taking your home prescription of Phenergan as needed for nausea/vomiting. Follow-up with your primary care provider in the next 3-4 days. Also contact your GI doctor to schedule a follow-up appointment. Return to the emergency department if symptoms worsen or new onset of fever, vomiting blood, abdominal pain, blood in stool.

## 2015-04-05 NOTE — ED Provider Notes (Signed)
CSN: [id number]     Arrival date & time 04/05/15  1101 History   First MD Initiated Contact with Patient 04/05/15 1225     Chief Complaint  Patient presents with  . Nausea  . 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 Patient reports having chronic nausea and vomiting due to gastroparesis. Endorses epigastric pain that he relates to vomiting. Denies fever, chills, headache, URI symptoms, hematemesis, SOB, CP, cough, diarrhea, constipation, blood in stool or urine, urinary symptoms. Patient states this episode is consistent with his chronic gastroparesis. He notes he has been vomiting multiple times daily for the past 5 days. He states he has been taking Phenergan at home with intermittent relief. Patient was seen in the ED Sunday for similar symptoms, was given IV Phenergan, fluids, Protonix, tolerated by mouth and with D/C home with GI follow-up. Patient reports his pain worsen Monday. He notes he tried taking Phenergan suppositories without relief, and then tried PO Phenergan with mild relief.  Patient is a 64 y.o. male presenting with vomiting.  Emesis Associated symptoms: abdominal pain     Past Medical History  Diagnosis Date  . Hypertension   . CAD (coronary artery disease)   . Gastroparesis   . GERD (gastroesophageal reflux disease)   . Hyperlipidemia   . Peripheral neuropathy (HCC)   . PONV (postoperative nausea and vomiting)   . Shortness of breath     exertion  . Depression   . Anxiety   . Pulmonary embolism (HCC)     hx. of 2012  . Peripheral vascular disease (HCC)   . OSA (obstructive sleep apnea)     "not bad enough for a mask"  . Type II diabetes mellitus (HCC)   . 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 H/O hiatal hernia   .  Arthritis     "all over"   . Charcot's joint     "left foot"   Past Surgical History  Procedure Laterality Date  . Foot surgery Left 2010    "for Charcot's joint"  . Vena cava filter placement  2012  . I&d extremity Left "multiple"    leg  . Im nailing tibia Left ~ 2012  . Toe amputation Right ~ 2011    "great toe"  . Shoulder arthroscopy w/ rotator cuff repair Left     "and bone spurs"  . Application of wound vac Left 01/07/2014  . Wound debridement Left 01/07/2014    "tibia"  . Tibial im rod removal Left 01/07/2014  . Cholecystectomy  06/2010  . Coronary angioplasty with stent placement  05/2009    "1"  . Hardware removal Left 01/07/2014    Procedure: LEFT LEG REMOVAL OF DEEP IMPLANT AND SEQUESTRECTOMY; APPLICATION OF WOUND VAC ;  Surgeon: Toni Arthurs, MD;  Location: [facility] OR;  Service: Orthopedics;  Laterality: Left;  . I&d extremity Left 01/07/2014    Procedure: 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;  Surgeon: Toni Arthurs, MD;  Location: [facility] OR;  Service: Orthopedics;  Laterality: Left;  . Amputation Left 03/08/2015    Procedure:  LEFT AMPUTATION BELOW KNEE;  Surgeon: Toni Arthurs, MD;  Location: [facility];  Service: Orthopedics;  Laterality: Left;   Family History  Problem Relation Age of Onset  . COPD Mother   . Heart disease Mother   .  Lung cancer Mother   . Retinal detachment Father   . Alcoholism Brother   . Heart disease Brother   . COPD Brother   . Diabetes Brother   . Hypertension Brother   . Stroke Brother    Social History  Substance Use Topics  . Smoking status: Never Smoker   . Smokeless tobacco: Never Used  . Alcohol Use: Yes     Comment: 01/07/2014 'might have a wine cooler a couple times/yr"    Review of Systems  Gastrointestinal: Positive for nausea, vomiting and abdominal pain.  All other systems reviewed and are negative.     Allergies  Codeine; Gabapentin; and Nabumetone  Home Medications   Prior to Admission medications    Medication Sig Start Date End Date Taking? Authorizing Provider  acetaminophen (TYLENOL) 325 MG tablet Take 650 mg by mouth every 6 (six) hours as needed for mild pain.   Yes Historical Provider, MD  amLODipine (NORVASC) 5 MG tablet Take 5 mg by mouth daily. Take one tablet by mouth once daily for hypertension   Yes Historical Provider, MD  Canagliflozin (INVOKANA) 100 MG TABS Take 100 mg by mouth every evening.    Yes Historical Provider, MD  diphenhydramine-acetaminophen (TYLENOL PM) 25-500 MG TABS tablet Take 1 tablet by mouth at bedtime.   Yes Historical Provider, MD  docusate sodium (COLACE) 100 MG capsule Take 100 mg by mouth every evening.    Yes Historical Provider, MD  ferrous sulfate 325 (65 FE) MG tablet Take 325 mg by mouth every evening.    Yes Historical Provider, MD  gabapentin (NEURONTIN) 300 MG capsule Take 300 mg by mouth 3 (three) times daily.   Yes Historical Provider, MD  insulin detemir (LEVEMIR) 100 UNIT/ML injection Inject 65 Units into the skin 2 (two) times daily.    Yes Historical Provider, MD  insulin lispro (HUMALOG) 100 UNIT/ML injection Inject 0-10 Units into the skin 3 (three) times daily with meals. Inject SQ per sliding scale 150-250 (5 units), 251-350(8 units), 351-450(10 units), >450=10 units   Yes Historical Provider, MD  Melatonin 5 MG TABS Take 5 mg by mouth at bedtime.    Yes Historical Provider, MD  metoCLOPramide (REGLAN) 10 MG tablet Take 10 mg by mouth 4 (four) times daily.   Yes Historical Provider, MD  nystatin-triamcinolone ointment (MYCOLOG) Apply 1 application topically 2 (two) times daily. Clean and dry area well, apply ointment topically after bathing twice daily   Yes Historical Provider, MD  omeprazole (PRILOSEC) 20 MG capsule Take 20 mg by mouth 2 (two) times daily before a meal.   Yes Historical Provider, MD  oxyCODONE (OXY IR/ROXICODONE) 5 MG immediate release tablet Take 1 tablet (5 mg total) by mouth every 4 (four) hours as needed for severe  pain. 03/15/15  Yes Hollice Espy, MD  pantoprazole (PROTONIX) 40 MG tablet Take 40 mg by mouth 2 (two) times daily before a meal.   Yes Historical Provider, MD  pioglitazone (ACTOS) 30 MG tablet Take 30 mg by mouth at bedtime.    Yes Historical Provider, MD  polyethylene glycol (MIRALAX / GLYCOLAX) packet Take 17 g by mouth daily. Patient taking differently: Take 17 g by mouth daily as needed for moderate constipation.  03/10/15  Yes Hollice Espy, MD  promethazine (PHENERGAN) 25 MG suppository Place 25 mg rectally every 6 (six) hours as needed for nausea or vomiting.   Yes Historical Provider, MD  promethazine (PHENERGAN) 25 MG tablet Take 25 mg by mouth  every 8 (eight) hours as needed for nausea or vomiting. Take one tablet by mouth every 8 hours as needed nausea/vomiting   Yes Historical Provider, MD  promethazine (PHENERGAN) 25 MG/ML injection Inject 25 mg into the vein every 4 (four) hours as needed for nausea or vomiting.   Yes Historical Provider, MD  ranitidine (ZANTAC) 150 MG tablet Take 150 mg by mouth daily as needed for heartburn.    Yes Historical Provider, MD  simvastatin (ZOCOR) 40 MG tablet Take 60 mg by mouth daily at 6 PM. Take one and half tablet by mouth once daily in the evening for cholesterol   Yes Historical Provider, MD  zinc oxide (BALMEX) 11.3 % CREA cream Apply topically to the buttock twice daily for blanchable redness   Yes Historical Provider, MD   BP 137/70 mmHg  Pulse 80  Temp(Src) 97.7 F (36.5 C) (Oral)  Resp 18  SpO2 98% Physical Exam  Constitutional: He is oriented to person, place, and time. He appears well-developed and well-nourished. No distress.  HENT:  Head: Normocephalic and atraumatic.  Mouth/Throat: Oropharynx is clear and moist. No oropharyngeal exudate.  Eyes: Conjunctivae and EOM are normal. Pupils are equal, round, and reactive to light. Right eye exhibits no discharge. Left eye exhibits no discharge. No scleral icterus.  Neck: Normal  range of motion. Neck supple.  Cardiovascular: Normal rate, regular rhythm, normal heart sounds and intact distal pulses.   No murmur heard. Pulmonary/Chest: Effort normal and breath sounds normal. No respiratory distress. He has no wheezes. He has no rales. He exhibits no tenderness.  Abdominal: Soft. Bowel sounds are normal. He exhibits no distension and no mass. There is tenderness (mild epigastric TTP). There is no rebound and no guarding.  Musculoskeletal: Normal range of motion. He exhibits no edema or tenderness.  Right BKA  Lymphadenopathy:    He has no cervical adenopathy.  Neurological: He is alert and oriented to person, place, and time.  Skin: Skin is warm and dry. He is not diaphoretic.  Nursing note and vitals reviewed.   ED Course  Procedures (including critical care time) Labs Review Labs Reviewed  LIPASE, BLOOD - Abnormal; Notable for the following:    Lipase 19 (*)    All other components within normal limits  COMPREHENSIVE METABOLIC PANEL - Abnormal; Notable for the following:    Glucose, Bld 102 (*)    All other components within normal limits  URINALYSIS, ROUTINE W REFLEX MICROSCOPIC (NOT AT [facility]) - Abnormal; Notable for the following:    Color, Urine AMBER (*)    Glucose, UA >1000 (*)    Bilirubin Urine SMALL (*)    Protein, ur 30 (*)    All other components within normal limits  URINE MICROSCOPIC-ADD ON - Abnormal; Notable for the following:    Casts HYALINE CASTS (*)    All other components within normal limits  CBC    Imaging Review No results found. I have personally reviewed and evaluated these images and lab results as part of my medical decision-making.  Filed Vitals:   04/05/15 1448  BP: 138/75  Pulse: 95  Temp: 97.9 F (36.6 C)  Resp: 18   Meds given in ED:  Medications  promethazine (PHENERGAN) injection 25 mg (25 mg Intravenous Given 04/05/15 1338)  sodium chloride 0.9 % bolus 1,000 mL (1,000 mLs Intravenous New Bag/Given 04/05/15  1338)    MDM   Final diagnoses:  Gastroparesis  Non-intractable vomiting with nausea, vomiting of unspecified type  Patient presents with worsening nausea and vomiting. History of gastroparesis and chronic nausea and vomiting. Patient reports this episode is consistent with his prior episodes of gastroparesis. Patient was seen in the ED 2 days ago for similar symptoms, vomiting controlled with IV Phenergan, was discharged home. Patient reports he was feeling better until last night when N/V worsened. He states he has been unable to eat or drink anything at home. Patient has tried Phenergan suppository with no relief and oral Phenergan with mild relief. VSS. Exam reveals mild epigastric TTP, remaining exam benign.   Labs unremarkable. Patient given IV Phenergan, fluids. Patient given IV Phenergan. Patient reports his nausea has improved. Pt tolerated PO challenge. Abdomen reexamined, no tenderness on exam. I suspect patient nausea and vomiting are due to chronic gastroparesis. I do not feel that further workup or imaging is warranted at this time. Plan to discharge patient home. Patient reports he has enough oral and suppository Phenergan at home. Patient advised to follow-up with his primary care provider this week. Also advised patient to schedule follow-up with his gastroenterologist.  Evaluation does not show pathology requring ongoing emergent intervention or admission. Pt is hemodynamically stable and mentating appropriately. Discussed findings/results and plan with patient/guardian, who agrees with plan. All questions answered. Return precautions discussed and outpatient follow up given.    Satira Sarkicole Elizabeth Spring ValleyNadeau, [place] JerseyPA-C 04/05/15 1531  Arby BarretteMarcy Pfeiffer, MD 04/09/15 [phone]

## 2015-04-05 NOTE — ED Notes (Addendum)
Per EMS, pt complains of nausea, vomiting since 1-2AM last night. Pt has hx of gastroparesis, chronic nausea/emesis.Pt was seen on Sunday for same.  Pt states he took an expired phenergan suppository for nausea/emesis yesterday which he states helped but caused loosed stools. Pt denies abdominal pain, diarrhea. Pt given 4mg  zofran en route to hospital.

## 2016-06-19 DIAGNOSIS — E1165 Type 2 diabetes mellitus with hyperglycemia: Secondary | ICD-10-CM | POA: Diagnosis not present

## 2016-06-19 DIAGNOSIS — I10 Essential (primary) hypertension: Secondary | ICD-10-CM | POA: Diagnosis not present

## 2016-06-25 DIAGNOSIS — E119 Type 2 diabetes mellitus without complications: Secondary | ICD-10-CM | POA: Diagnosis not present

## 2016-06-25 DIAGNOSIS — H2511 Age-related nuclear cataract, right eye: Secondary | ICD-10-CM | POA: Diagnosis not present

## 2016-06-25 DIAGNOSIS — H25013 Cortical age-related cataract, bilateral: Secondary | ICD-10-CM | POA: Diagnosis not present

## 2016-06-25 DIAGNOSIS — H5703 Miosis: Secondary | ICD-10-CM | POA: Diagnosis not present

## 2016-06-25 DIAGNOSIS — H2513 Age-related nuclear cataract, bilateral: Secondary | ICD-10-CM | POA: Diagnosis not present

## 2016-06-25 LAB — HM DIABETES EYE EXAM

## 2016-06-26 DIAGNOSIS — E1165 Type 2 diabetes mellitus with hyperglycemia: Secondary | ICD-10-CM | POA: Diagnosis not present

## 2016-06-26 DIAGNOSIS — I10 Essential (primary) hypertension: Secondary | ICD-10-CM | POA: Diagnosis not present

## 2016-06-26 DIAGNOSIS — E7800 Pure hypercholesterolemia, unspecified: Secondary | ICD-10-CM | POA: Diagnosis not present

## 2016-07-11 ENCOUNTER — Encounter (INDEPENDENT_AMBULATORY_CARE_PROVIDER_SITE_OTHER): Payer: PPO | Admitting: Ophthalmology

## 2016-07-11 DIAGNOSIS — E113293 Type 2 diabetes mellitus with mild nonproliferative diabetic retinopathy without macular edema, bilateral: Secondary | ICD-10-CM

## 2016-07-11 DIAGNOSIS — H2513 Age-related nuclear cataract, bilateral: Secondary | ICD-10-CM

## 2016-07-11 DIAGNOSIS — I10 Essential (primary) hypertension: Secondary | ICD-10-CM

## 2016-07-11 DIAGNOSIS — H33301 Unspecified retinal break, right eye: Secondary | ICD-10-CM

## 2016-07-11 DIAGNOSIS — H43813 Vitreous degeneration, bilateral: Secondary | ICD-10-CM

## 2016-07-11 DIAGNOSIS — E11319 Type 2 diabetes mellitus with unspecified diabetic retinopathy without macular edema: Secondary | ICD-10-CM | POA: Diagnosis not present

## 2016-07-11 DIAGNOSIS — H35033 Hypertensive retinopathy, bilateral: Secondary | ICD-10-CM | POA: Diagnosis not present

## 2016-07-27 ENCOUNTER — Ambulatory Visit (INDEPENDENT_AMBULATORY_CARE_PROVIDER_SITE_OTHER): Payer: PPO | Admitting: Ophthalmology

## 2016-08-07 DIAGNOSIS — E7800 Pure hypercholesterolemia, unspecified: Secondary | ICD-10-CM | POA: Diagnosis not present

## 2016-08-07 DIAGNOSIS — M25511 Pain in right shoulder: Secondary | ICD-10-CM | POA: Diagnosis not present

## 2016-08-07 DIAGNOSIS — I10 Essential (primary) hypertension: Secondary | ICD-10-CM | POA: Diagnosis not present

## 2016-08-07 DIAGNOSIS — E1165 Type 2 diabetes mellitus with hyperglycemia: Secondary | ICD-10-CM | POA: Diagnosis not present

## 2016-09-24 DIAGNOSIS — Z794 Long term (current) use of insulin: Secondary | ICD-10-CM | POA: Diagnosis not present

## 2016-09-24 DIAGNOSIS — E118 Type 2 diabetes mellitus with unspecified complications: Secondary | ICD-10-CM | POA: Diagnosis not present

## 2016-09-24 DIAGNOSIS — Z89512 Acquired absence of left leg below knee: Secondary | ICD-10-CM | POA: Diagnosis not present

## 2016-09-28 DIAGNOSIS — Z794 Long term (current) use of insulin: Secondary | ICD-10-CM | POA: Diagnosis not present

## 2016-09-28 DIAGNOSIS — K3184 Gastroparesis: Secondary | ICD-10-CM | POA: Diagnosis not present

## 2016-09-28 DIAGNOSIS — Z89512 Acquired absence of left leg below knee: Secondary | ICD-10-CM | POA: Diagnosis not present

## 2016-09-28 DIAGNOSIS — M25562 Pain in left knee: Secondary | ICD-10-CM | POA: Diagnosis not present

## 2016-09-28 DIAGNOSIS — G4733 Obstructive sleep apnea (adult) (pediatric): Secondary | ICD-10-CM | POA: Diagnosis not present

## 2016-09-28 DIAGNOSIS — Z86711 Personal history of pulmonary embolism: Secondary | ICD-10-CM | POA: Diagnosis not present

## 2016-09-28 DIAGNOSIS — Z955 Presence of coronary angioplasty implant and graft: Secondary | ICD-10-CM | POA: Diagnosis not present

## 2016-09-28 DIAGNOSIS — Z8631 Personal history of diabetic foot ulcer: Secondary | ICD-10-CM | POA: Diagnosis not present

## 2016-09-28 DIAGNOSIS — Z7982 Long term (current) use of aspirin: Secondary | ICD-10-CM | POA: Diagnosis not present

## 2016-09-28 DIAGNOSIS — E11610 Type 2 diabetes mellitus with diabetic neuropathic arthropathy: Secondary | ICD-10-CM | POA: Diagnosis not present

## 2016-09-28 DIAGNOSIS — I2510 Atherosclerotic heart disease of native coronary artery without angina pectoris: Secondary | ICD-10-CM | POA: Diagnosis not present

## 2016-09-28 DIAGNOSIS — E1143 Type 2 diabetes mellitus with diabetic autonomic (poly)neuropathy: Secondary | ICD-10-CM | POA: Diagnosis not present

## 2016-09-28 DIAGNOSIS — M25662 Stiffness of left knee, not elsewhere classified: Secondary | ICD-10-CM | POA: Diagnosis not present

## 2016-10-03 DIAGNOSIS — K3184 Gastroparesis: Secondary | ICD-10-CM | POA: Diagnosis not present

## 2016-10-03 DIAGNOSIS — Z86711 Personal history of pulmonary embolism: Secondary | ICD-10-CM | POA: Diagnosis not present

## 2016-10-03 DIAGNOSIS — Z955 Presence of coronary angioplasty implant and graft: Secondary | ICD-10-CM | POA: Diagnosis not present

## 2016-10-03 DIAGNOSIS — I2510 Atherosclerotic heart disease of native coronary artery without angina pectoris: Secondary | ICD-10-CM | POA: Diagnosis not present

## 2016-10-03 DIAGNOSIS — Z794 Long term (current) use of insulin: Secondary | ICD-10-CM | POA: Diagnosis not present

## 2016-10-03 DIAGNOSIS — M25562 Pain in left knee: Secondary | ICD-10-CM | POA: Diagnosis not present

## 2016-10-03 DIAGNOSIS — Z89512 Acquired absence of left leg below knee: Secondary | ICD-10-CM | POA: Diagnosis not present

## 2016-10-03 DIAGNOSIS — M25662 Stiffness of left knee, not elsewhere classified: Secondary | ICD-10-CM | POA: Diagnosis not present

## 2016-10-03 DIAGNOSIS — E1143 Type 2 diabetes mellitus with diabetic autonomic (poly)neuropathy: Secondary | ICD-10-CM | POA: Diagnosis not present

## 2016-10-03 DIAGNOSIS — Z8631 Personal history of diabetic foot ulcer: Secondary | ICD-10-CM | POA: Diagnosis not present

## 2016-10-03 DIAGNOSIS — G4733 Obstructive sleep apnea (adult) (pediatric): Secondary | ICD-10-CM | POA: Diagnosis not present

## 2016-10-03 DIAGNOSIS — Z7982 Long term (current) use of aspirin: Secondary | ICD-10-CM | POA: Diagnosis not present

## 2016-10-03 DIAGNOSIS — E11610 Type 2 diabetes mellitus with diabetic neuropathic arthropathy: Secondary | ICD-10-CM | POA: Diagnosis not present

## 2016-10-06 DIAGNOSIS — G4733 Obstructive sleep apnea (adult) (pediatric): Secondary | ICD-10-CM | POA: Diagnosis not present

## 2016-10-06 DIAGNOSIS — Z7982 Long term (current) use of aspirin: Secondary | ICD-10-CM | POA: Diagnosis not present

## 2016-10-06 DIAGNOSIS — I2510 Atherosclerotic heart disease of native coronary artery without angina pectoris: Secondary | ICD-10-CM | POA: Diagnosis not present

## 2016-10-06 DIAGNOSIS — Z955 Presence of coronary angioplasty implant and graft: Secondary | ICD-10-CM | POA: Diagnosis not present

## 2016-10-06 DIAGNOSIS — Z8631 Personal history of diabetic foot ulcer: Secondary | ICD-10-CM | POA: Diagnosis not present

## 2016-10-06 DIAGNOSIS — Z794 Long term (current) use of insulin: Secondary | ICD-10-CM | POA: Diagnosis not present

## 2016-10-06 DIAGNOSIS — M25662 Stiffness of left knee, not elsewhere classified: Secondary | ICD-10-CM | POA: Diagnosis not present

## 2016-10-06 DIAGNOSIS — K3184 Gastroparesis: Secondary | ICD-10-CM | POA: Diagnosis not present

## 2016-10-06 DIAGNOSIS — M25562 Pain in left knee: Secondary | ICD-10-CM | POA: Diagnosis not present

## 2016-10-06 DIAGNOSIS — Z86711 Personal history of pulmonary embolism: Secondary | ICD-10-CM | POA: Diagnosis not present

## 2016-10-06 DIAGNOSIS — Z89512 Acquired absence of left leg below knee: Secondary | ICD-10-CM | POA: Diagnosis not present

## 2016-10-06 DIAGNOSIS — E1143 Type 2 diabetes mellitus with diabetic autonomic (poly)neuropathy: Secondary | ICD-10-CM | POA: Diagnosis not present

## 2016-10-06 DIAGNOSIS — E11610 Type 2 diabetes mellitus with diabetic neuropathic arthropathy: Secondary | ICD-10-CM | POA: Diagnosis not present

## 2016-10-11 DIAGNOSIS — M25552 Pain in left hip: Secondary | ICD-10-CM | POA: Diagnosis not present

## 2016-10-11 DIAGNOSIS — M25561 Pain in right knee: Secondary | ICD-10-CM | POA: Diagnosis not present

## 2016-10-11 DIAGNOSIS — M25551 Pain in right hip: Secondary | ICD-10-CM | POA: Diagnosis not present

## 2016-10-11 DIAGNOSIS — M25562 Pain in left knee: Secondary | ICD-10-CM | POA: Diagnosis not present

## 2016-10-16 DIAGNOSIS — M25662 Stiffness of left knee, not elsewhere classified: Secondary | ICD-10-CM | POA: Diagnosis not present

## 2016-10-16 DIAGNOSIS — Z89512 Acquired absence of left leg below knee: Secondary | ICD-10-CM | POA: Diagnosis not present

## 2016-10-16 DIAGNOSIS — E11610 Type 2 diabetes mellitus with diabetic neuropathic arthropathy: Secondary | ICD-10-CM | POA: Diagnosis not present

## 2016-10-16 DIAGNOSIS — Z955 Presence of coronary angioplasty implant and graft: Secondary | ICD-10-CM | POA: Diagnosis not present

## 2016-10-16 DIAGNOSIS — Z8631 Personal history of diabetic foot ulcer: Secondary | ICD-10-CM | POA: Diagnosis not present

## 2016-10-16 DIAGNOSIS — Z794 Long term (current) use of insulin: Secondary | ICD-10-CM | POA: Diagnosis not present

## 2016-10-16 DIAGNOSIS — Z7982 Long term (current) use of aspirin: Secondary | ICD-10-CM | POA: Diagnosis not present

## 2016-10-16 DIAGNOSIS — K3184 Gastroparesis: Secondary | ICD-10-CM | POA: Diagnosis not present

## 2016-10-16 DIAGNOSIS — E1143 Type 2 diabetes mellitus with diabetic autonomic (poly)neuropathy: Secondary | ICD-10-CM | POA: Diagnosis not present

## 2016-10-16 DIAGNOSIS — G4733 Obstructive sleep apnea (adult) (pediatric): Secondary | ICD-10-CM | POA: Diagnosis not present

## 2016-10-16 DIAGNOSIS — I2510 Atherosclerotic heart disease of native coronary artery without angina pectoris: Secondary | ICD-10-CM | POA: Diagnosis not present

## 2016-10-16 DIAGNOSIS — Z86711 Personal history of pulmonary embolism: Secondary | ICD-10-CM | POA: Diagnosis not present

## 2016-10-16 DIAGNOSIS — M25562 Pain in left knee: Secondary | ICD-10-CM | POA: Diagnosis not present

## 2016-10-23 DIAGNOSIS — G4733 Obstructive sleep apnea (adult) (pediatric): Secondary | ICD-10-CM | POA: Diagnosis not present

## 2016-10-23 DIAGNOSIS — Z89512 Acquired absence of left leg below knee: Secondary | ICD-10-CM | POA: Diagnosis not present

## 2016-10-23 DIAGNOSIS — I2510 Atherosclerotic heart disease of native coronary artery without angina pectoris: Secondary | ICD-10-CM | POA: Diagnosis not present

## 2016-10-23 DIAGNOSIS — Z8631 Personal history of diabetic foot ulcer: Secondary | ICD-10-CM | POA: Diagnosis not present

## 2016-10-23 DIAGNOSIS — E1142 Type 2 diabetes mellitus with diabetic polyneuropathy: Secondary | ICD-10-CM | POA: Diagnosis not present

## 2016-10-23 DIAGNOSIS — Z794 Long term (current) use of insulin: Secondary | ICD-10-CM | POA: Diagnosis not present

## 2016-10-23 DIAGNOSIS — K3184 Gastroparesis: Secondary | ICD-10-CM | POA: Diagnosis not present

## 2016-10-23 DIAGNOSIS — M25562 Pain in left knee: Secondary | ICD-10-CM | POA: Diagnosis not present

## 2016-10-23 DIAGNOSIS — E1143 Type 2 diabetes mellitus with diabetic autonomic (poly)neuropathy: Secondary | ICD-10-CM | POA: Diagnosis not present

## 2016-10-23 DIAGNOSIS — Z7982 Long term (current) use of aspirin: Secondary | ICD-10-CM | POA: Diagnosis not present

## 2016-10-23 DIAGNOSIS — Z86711 Personal history of pulmonary embolism: Secondary | ICD-10-CM | POA: Diagnosis not present

## 2016-10-23 DIAGNOSIS — Z955 Presence of coronary angioplasty implant and graft: Secondary | ICD-10-CM | POA: Diagnosis not present

## 2016-10-23 DIAGNOSIS — E11610 Type 2 diabetes mellitus with diabetic neuropathic arthropathy: Secondary | ICD-10-CM | POA: Diagnosis not present

## 2016-10-23 DIAGNOSIS — M25551 Pain in right hip: Secondary | ICD-10-CM | POA: Diagnosis not present

## 2016-10-23 DIAGNOSIS — M25662 Stiffness of left knee, not elsewhere classified: Secondary | ICD-10-CM | POA: Diagnosis not present

## 2016-10-30 DIAGNOSIS — M25662 Stiffness of left knee, not elsewhere classified: Secondary | ICD-10-CM | POA: Diagnosis not present

## 2016-10-30 DIAGNOSIS — Z794 Long term (current) use of insulin: Secondary | ICD-10-CM | POA: Diagnosis not present

## 2016-10-30 DIAGNOSIS — E11610 Type 2 diabetes mellitus with diabetic neuropathic arthropathy: Secondary | ICD-10-CM | POA: Diagnosis not present

## 2016-10-30 DIAGNOSIS — Z7982 Long term (current) use of aspirin: Secondary | ICD-10-CM | POA: Diagnosis not present

## 2016-10-30 DIAGNOSIS — Z86711 Personal history of pulmonary embolism: Secondary | ICD-10-CM | POA: Diagnosis not present

## 2016-10-30 DIAGNOSIS — Z8631 Personal history of diabetic foot ulcer: Secondary | ICD-10-CM | POA: Diagnosis not present

## 2016-10-30 DIAGNOSIS — M25562 Pain in left knee: Secondary | ICD-10-CM | POA: Diagnosis not present

## 2016-10-30 DIAGNOSIS — K3184 Gastroparesis: Secondary | ICD-10-CM | POA: Diagnosis not present

## 2016-10-30 DIAGNOSIS — Z89512 Acquired absence of left leg below knee: Secondary | ICD-10-CM | POA: Diagnosis not present

## 2016-10-30 DIAGNOSIS — G4733 Obstructive sleep apnea (adult) (pediatric): Secondary | ICD-10-CM | POA: Diagnosis not present

## 2016-10-30 DIAGNOSIS — Z955 Presence of coronary angioplasty implant and graft: Secondary | ICD-10-CM | POA: Diagnosis not present

## 2016-10-30 DIAGNOSIS — I2510 Atherosclerotic heart disease of native coronary artery without angina pectoris: Secondary | ICD-10-CM | POA: Diagnosis not present

## 2016-10-30 DIAGNOSIS — E1143 Type 2 diabetes mellitus with diabetic autonomic (poly)neuropathy: Secondary | ICD-10-CM | POA: Diagnosis not present

## 2016-11-06 DIAGNOSIS — E1143 Type 2 diabetes mellitus with diabetic autonomic (poly)neuropathy: Secondary | ICD-10-CM | POA: Diagnosis not present

## 2016-11-06 DIAGNOSIS — G4733 Obstructive sleep apnea (adult) (pediatric): Secondary | ICD-10-CM | POA: Diagnosis not present

## 2016-11-06 DIAGNOSIS — K3184 Gastroparesis: Secondary | ICD-10-CM | POA: Diagnosis not present

## 2016-11-06 DIAGNOSIS — Z7982 Long term (current) use of aspirin: Secondary | ICD-10-CM | POA: Diagnosis not present

## 2016-11-06 DIAGNOSIS — Z955 Presence of coronary angioplasty implant and graft: Secondary | ICD-10-CM | POA: Diagnosis not present

## 2016-11-06 DIAGNOSIS — I2510 Atherosclerotic heart disease of native coronary artery without angina pectoris: Secondary | ICD-10-CM | POA: Diagnosis not present

## 2016-11-06 DIAGNOSIS — Z86711 Personal history of pulmonary embolism: Secondary | ICD-10-CM | POA: Diagnosis not present

## 2016-11-06 DIAGNOSIS — Z794 Long term (current) use of insulin: Secondary | ICD-10-CM | POA: Diagnosis not present

## 2016-11-06 DIAGNOSIS — M25562 Pain in left knee: Secondary | ICD-10-CM | POA: Diagnosis not present

## 2016-11-06 DIAGNOSIS — Z8631 Personal history of diabetic foot ulcer: Secondary | ICD-10-CM | POA: Diagnosis not present

## 2016-11-06 DIAGNOSIS — Z89512 Acquired absence of left leg below knee: Secondary | ICD-10-CM | POA: Diagnosis not present

## 2016-11-06 DIAGNOSIS — E11610 Type 2 diabetes mellitus with diabetic neuropathic arthropathy: Secondary | ICD-10-CM | POA: Diagnosis not present

## 2016-11-06 DIAGNOSIS — M25662 Stiffness of left knee, not elsewhere classified: Secondary | ICD-10-CM | POA: Diagnosis not present

## 2016-11-13 DIAGNOSIS — Z955 Presence of coronary angioplasty implant and graft: Secondary | ICD-10-CM | POA: Diagnosis not present

## 2016-11-13 DIAGNOSIS — G4733 Obstructive sleep apnea (adult) (pediatric): Secondary | ICD-10-CM | POA: Diagnosis not present

## 2016-11-13 DIAGNOSIS — E1143 Type 2 diabetes mellitus with diabetic autonomic (poly)neuropathy: Secondary | ICD-10-CM | POA: Diagnosis not present

## 2016-11-13 DIAGNOSIS — Z794 Long term (current) use of insulin: Secondary | ICD-10-CM | POA: Diagnosis not present

## 2016-11-13 DIAGNOSIS — K3184 Gastroparesis: Secondary | ICD-10-CM | POA: Diagnosis not present

## 2016-11-13 DIAGNOSIS — M25562 Pain in left knee: Secondary | ICD-10-CM | POA: Diagnosis not present

## 2016-11-13 DIAGNOSIS — E11610 Type 2 diabetes mellitus with diabetic neuropathic arthropathy: Secondary | ICD-10-CM | POA: Diagnosis not present

## 2016-11-13 DIAGNOSIS — Z8631 Personal history of diabetic foot ulcer: Secondary | ICD-10-CM | POA: Diagnosis not present

## 2016-11-13 DIAGNOSIS — Z7982 Long term (current) use of aspirin: Secondary | ICD-10-CM | POA: Diagnosis not present

## 2016-11-13 DIAGNOSIS — M25662 Stiffness of left knee, not elsewhere classified: Secondary | ICD-10-CM | POA: Diagnosis not present

## 2016-11-13 DIAGNOSIS — I2510 Atherosclerotic heart disease of native coronary artery without angina pectoris: Secondary | ICD-10-CM | POA: Diagnosis not present

## 2016-11-13 DIAGNOSIS — Z89512 Acquired absence of left leg below knee: Secondary | ICD-10-CM | POA: Diagnosis not present

## 2016-11-13 DIAGNOSIS — Z86711 Personal history of pulmonary embolism: Secondary | ICD-10-CM | POA: Diagnosis not present

## 2016-11-18 ENCOUNTER — Inpatient Hospital Stay (HOSPITAL_COMMUNITY)
Admission: EM | Admit: 2016-11-18 | Discharge: 2016-11-21 | DRG: 167 | Disposition: A | Discharge status: Home Health | Payer: PPO | Attending: Internal Medicine | Admitting: Internal Medicine

## 2016-11-18 ENCOUNTER — Emergency Department (HOSPITAL_COMMUNITY): Payer: PPO

## 2016-11-18 ENCOUNTER — Encounter (HOSPITAL_COMMUNITY): Payer: Self-pay | Admitting: Obstetrics and Gynecology

## 2016-11-18 DIAGNOSIS — I2601 Septic pulmonary embolism with acute cor pulmonale: Secondary | ICD-10-CM | POA: Diagnosis not present

## 2016-11-18 DIAGNOSIS — E1143 Type 2 diabetes mellitus with diabetic autonomic (poly)neuropathy: Secondary | ICD-10-CM | POA: Diagnosis present

## 2016-11-18 DIAGNOSIS — R112 Nausea with vomiting, unspecified: Secondary | ICD-10-CM | POA: Diagnosis not present

## 2016-11-18 DIAGNOSIS — J80 Acute respiratory distress syndrome: Secondary | ICD-10-CM | POA: Diagnosis not present

## 2016-11-18 DIAGNOSIS — Z951 Presence of aortocoronary bypass graft: Secondary | ICD-10-CM

## 2016-11-18 DIAGNOSIS — E669 Obesity, unspecified: Secondary | ICD-10-CM | POA: Diagnosis present

## 2016-11-18 DIAGNOSIS — R000 Tachycardia, unspecified: Secondary | ICD-10-CM | POA: Diagnosis present

## 2016-11-18 DIAGNOSIS — Z452 Encounter for adjustment and management of vascular access device: Secondary | ICD-10-CM | POA: Diagnosis not present

## 2016-11-18 DIAGNOSIS — E785 Hyperlipidemia, unspecified: Secondary | ICD-10-CM | POA: Diagnosis not present

## 2016-11-18 DIAGNOSIS — I739 Peripheral vascular disease, unspecified: Secondary | ICD-10-CM | POA: Diagnosis not present

## 2016-11-18 DIAGNOSIS — Z833 Family history of diabetes mellitus: Secondary | ICD-10-CM | POA: Diagnosis not present

## 2016-11-18 DIAGNOSIS — E1140 Type 2 diabetes mellitus with diabetic neuropathy, unspecified: Secondary | ICD-10-CM | POA: Diagnosis not present

## 2016-11-18 DIAGNOSIS — E1151 Type 2 diabetes mellitus with diabetic peripheral angiopathy without gangrene: Secondary | ICD-10-CM | POA: Diagnosis not present

## 2016-11-18 DIAGNOSIS — G4733 Obstructive sleep apnea (adult) (pediatric): Secondary | ICD-10-CM | POA: Diagnosis present

## 2016-11-18 DIAGNOSIS — E1100 Type 2 diabetes mellitus with hyperosmolarity without nonketotic hyperglycemic-hyperosmolar coma (NKHHC): Secondary | ICD-10-CM | POA: Diagnosis not present

## 2016-11-18 DIAGNOSIS — N179 Acute kidney failure, unspecified: Secondary | ICD-10-CM | POA: Diagnosis not present

## 2016-11-18 DIAGNOSIS — Z86711 Personal history of pulmonary embolism: Secondary | ICD-10-CM | POA: Diagnosis not present

## 2016-11-18 DIAGNOSIS — I10 Essential (primary) hypertension: Secondary | ICD-10-CM | POA: Diagnosis not present

## 2016-11-18 DIAGNOSIS — Z86718 Personal history of other venous thrombosis and embolism: Secondary | ICD-10-CM | POA: Diagnosis not present

## 2016-11-18 DIAGNOSIS — Z6835 Body mass index (BMI) 35.0-35.9, adult: Secondary | ICD-10-CM

## 2016-11-18 DIAGNOSIS — Z794 Long term (current) use of insulin: Secondary | ICD-10-CM

## 2016-11-18 DIAGNOSIS — E860 Dehydration: Secondary | ICD-10-CM | POA: Diagnosis not present

## 2016-11-18 DIAGNOSIS — I2699 Other pulmonary embolism without acute cor pulmonale: Secondary | ICD-10-CM | POA: Diagnosis not present

## 2016-11-18 DIAGNOSIS — R0602 Shortness of breath: Secondary | ICD-10-CM | POA: Diagnosis not present

## 2016-11-18 DIAGNOSIS — E1165 Type 2 diabetes mellitus with hyperglycemia: Secondary | ICD-10-CM | POA: Diagnosis not present

## 2016-11-18 DIAGNOSIS — Z89512 Acquired absence of left leg below knee: Secondary | ICD-10-CM | POA: Diagnosis not present

## 2016-11-18 DIAGNOSIS — Z801 Family history of malignant neoplasm of trachea, bronchus and lung: Secondary | ICD-10-CM

## 2016-11-18 DIAGNOSIS — K3184 Gastroparesis: Secondary | ICD-10-CM

## 2016-11-18 DIAGNOSIS — Z8249 Family history of ischemic heart disease and other diseases of the circulatory system: Secondary | ICD-10-CM | POA: Diagnosis not present

## 2016-11-18 DIAGNOSIS — R0682 Tachypnea, not elsewhere classified: Secondary | ICD-10-CM | POA: Diagnosis not present

## 2016-11-18 DIAGNOSIS — Z9861 Coronary angioplasty status: Secondary | ICD-10-CM

## 2016-11-18 DIAGNOSIS — Z823 Family history of stroke: Secondary | ICD-10-CM | POA: Diagnosis not present

## 2016-11-18 DIAGNOSIS — IMO0002 Reserved for concepts with insufficient information to code with codable children: Secondary | ICD-10-CM | POA: Diagnosis present

## 2016-11-18 DIAGNOSIS — Z825 Family history of asthma and other chronic lower respiratory diseases: Secondary | ICD-10-CM

## 2016-11-18 DIAGNOSIS — R0902 Hypoxemia: Secondary | ICD-10-CM | POA: Diagnosis present

## 2016-11-18 DIAGNOSIS — Z888 Allergy status to other drugs, medicaments and biological substances status: Secondary | ICD-10-CM

## 2016-11-18 DIAGNOSIS — I2782 Chronic pulmonary embolism: Secondary | ICD-10-CM | POA: Diagnosis not present

## 2016-11-18 DIAGNOSIS — I2510 Atherosclerotic heart disease of native coronary artery without angina pectoris: Secondary | ICD-10-CM | POA: Diagnosis not present

## 2016-11-18 DIAGNOSIS — R110 Nausea: Secondary | ICD-10-CM | POA: Diagnosis not present

## 2016-11-18 DIAGNOSIS — R918 Other nonspecific abnormal finding of lung field: Secondary | ICD-10-CM | POA: Diagnosis not present

## 2016-11-18 DIAGNOSIS — Z95828 Presence of other vascular implants and grafts: Secondary | ICD-10-CM

## 2016-11-18 DIAGNOSIS — Z419 Encounter for procedure for purposes other than remedying health state, unspecified: Secondary | ICD-10-CM

## 2016-11-18 DIAGNOSIS — K219 Gastro-esophageal reflux disease without esophagitis: Secondary | ICD-10-CM | POA: Diagnosis not present

## 2016-11-18 LAB — COMPREHENSIVE METABOLIC PANEL
ALBUMIN: 3.9 g/dL (ref 3.5–5.0)
ALT: 21 U/L (ref 17–63)
ANION GAP: 21 — AB (ref 5–15)
AST: 19 U/L (ref 15–41)
Alkaline Phosphatase: 52 U/L (ref 38–126)
BILIRUBIN TOTAL: 1.4 mg/dL — AB (ref 0.3–1.2)
BUN: 22 mg/dL — AB (ref 6–20)
CHLORIDE: 101 mmol/L (ref 101–111)
CO2: 16 mmol/L — ABNORMAL LOW (ref 22–32)
Calcium: 9.7 mg/dL (ref 8.9–10.3)
Creatinine, Ser: 1.36 mg/dL — ABNORMAL HIGH (ref 0.61–1.24)
GFR calc Af Amer: >60 mL/min (ref >60)
GFR calc non Af Amer: 54 mL/min — ABNORMAL LOW (ref >60)
GLUCOSE: 253 mg/dL — AB (ref 65–99)
POTASSIUM: 4.6 mmol/L (ref 3.5–5.1)
SODIUM: 138 mmol/L (ref 135–145)
TOTAL PROTEIN: 8 g/dL (ref 6.5–8.1)

## 2016-11-18 LAB — APTT: aPTT: 27 seconds (ref 24–36)

## 2016-11-18 LAB — CBC WITH DIFFERENTIAL/PLATELET
Basophils Absolute: 0 10*3/uL (ref 0.0–0.1)
Basophils Relative: 0 %
EOS ABS: 0 10*3/uL (ref 0.0–0.7)
Eosinophils Relative: 0 %
HCT: 44.2 % (ref 39.0–52.0)
HEMOGLOBIN: 15.2 g/dL (ref 13.0–17.0)
LYMPHS ABS: 1.1 10*3/uL (ref 0.7–4.0)
Lymphocytes Relative: 12 %
MCH: 31.4 pg (ref 26.0–34.0)
MCHC: 34.4 g/dL (ref 30.0–36.0)
MCV: 91.3 fL (ref 78.0–100.0)
MONOS PCT: 5 %
Monocytes Absolute: 0.4 10*3/uL (ref 0.1–1.0)
NEUTROS ABS: 7.7 10*3/uL (ref 1.7–7.7)
NEUTROS PCT: 83 %
Platelets: 227 10*3/uL (ref 150–400)
RBC: 4.84 MIL/uL (ref 4.22–5.81)
RDW: 12.8 % (ref 11.5–15.5)
WBC: 9.2 10*3/uL (ref 4.0–10.5)

## 2016-11-18 LAB — PROTIME-INR
INR: 1.04
Prothrombin Time: 13.7 seconds (ref 11.4–15.2)

## 2016-11-18 LAB — D-DIMER, QUANTITATIVE: D-Dimer, Quant: 0.34 ug/mL-FEU (ref 0.00–0.50)

## 2016-11-18 LAB — I-STAT TROPONIN, ED: Troponin i, poc: 0 ng/mL (ref 0.00–0.08)

## 2016-11-18 LAB — LIPASE, BLOOD: Lipase: 17 U/L (ref 11–51)

## 2016-11-18 MED ORDER — PROMETHAZINE HCL 25 MG/ML IJ SOLN
12.5000 mg | Freq: Once | INTRAMUSCULAR | Status: AC
  Administered 2016-11-18: 12.5 mg via INTRAVENOUS
  Filled 2016-11-18: qty 1

## 2016-11-18 MED ORDER — HEPARIN (PORCINE) IN NACL 100-0.45 UNIT/ML-% IJ SOLN
1900.0000 [IU]/h | INTRAMUSCULAR | Status: DC
  Administered 2016-11-18 – 2016-11-19 (×2): 1900 [IU]/h via INTRAVENOUS
  Filled 2016-11-18 (×2): qty 250

## 2016-11-18 MED ORDER — SODIUM CHLORIDE 0.9 % IV BOLUS (SEPSIS)
1000.0000 mL | Freq: Once | INTRAVENOUS | Status: AC
  Administered 2016-11-18: 1000 mL via INTRAVENOUS

## 2016-11-18 MED ORDER — HEPARIN BOLUS VIA INFUSION
5000.0000 [IU] | Freq: Once | INTRAVENOUS | Status: AC
  Administered 2016-11-18: 5000 [IU] via INTRAVENOUS
  Filled 2016-11-18: qty 5000

## 2016-11-18 MED ORDER — IOPAMIDOL (ISOVUE-370) INJECTION 76%
INTRAVENOUS | Status: AC
  Administered 2016-11-18: 100 mL via INTRAVENOUS
  Filled 2016-11-18: qty 100

## 2016-11-18 NOTE — ED Provider Notes (Signed)
[facility]-EMERGENCY DEPT Provider Note   CSN: [id number] Arrival date & time: 11/18/16  1754     History   Chief Complaint Chief Complaint  Patient presents with  . Shortness of Breath  . Emesis  . Nausea    HPI Samuel [name] is a 65 y.o. male.  HPI  Patient with past medical history of gastroparesis and prior PE, presents with 1 week history of shortness of breath and 2 day history of nausea, vomiting. He states that the nausea and vomiting is similar to his previous episodes of gastroparesis. He has tried taking his Phenergan at home with no alleviation of symptoms. Decreased appetite due to nausea.  He also reports that the shortness of breath appears similar to when he had a PE approximately 8 years ago. He states that the shortness of breath occurs even when laying in bed but also when he practices physical therapy for his legs. Patient reports IVC filter was inserted after his PE but was taken out before he was discharged from the hospital tomorrow. He denies fever or cough. He denies hemoptysis, leg swelling, recent surgery, recent illness, chest pain, blood in vomit, diarrhea, abdominal pain.  Past Medical History:  Diagnosis Date  . Anxiety   . Arthritis    "all over"   . CAD (coronary artery disease)   . Charcot's joint    "left foot"  . 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 Depression   . Gastroparesis   . GERD (gastroesophageal reflux disease)   . H/O hiatal hernia   . Hyperlipidemia   . Hypertension   . OSA (obstructive sleep apnea)    "not bad enough for a mask"  . Peripheral neuropathy   . Peripheral vascular disease (HCC)   . PONV (postoperative nausea and vomiting)   . Pulmonary embolism (HCC)    hx. of 2012  . Shortness of breath    exertion  . Type II diabetes mellitus [facility])     Patient Active Problem List   Diagnosis Date Noted  . Osteomyelitis of foot (HCC)   . Protein-calorie malnutrition, severe (HCC) 03/08/2015  .  Osteomyelitis of foot, acute (HCC) 03/07/2015  . Sepsis (HCC) 03/07/2015  . Diabetes mellitus type 2, uncontrolled (HCC) 03/07/2015  . Normocytic normochromic anemia 03/07/2015  . Obesity (BMI 30-39.9) 03/07/2015  . Nausea & vomiting   . DM (diabetes mellitus), type 2 (HCC) 01/07/2014  . Chronic osteomyelitis involving lower leg (HCC) 01/07/2014  . Vomiting 12/22/2013  . Gastroparesis 12/21/2013  . Intractable nausea and vomiting 12/21/2013  . Coronary artery disease 05/01/2013  . Essential hypertension 05/01/2013  . Hyperlipidemia 05/01/2013  . Generalized weakness 03/20/2013  . Back pain 03/20/2013  . Pulmonary embolism (HCC) 03/27/2011  . PERFORATION OF GALLBLADDER 08/30/2010  . Ulcer of lower limb, unspecified 03/01/2010  . METHICILLIN SUSCEPTIBLE STAPH AUREUS SEPTICEMIA 01/25/2010  . Acute osteomyelitis, ankle and foot 01/25/2010  . NAUSEA AND VOMITING 01/25/2010  . GERD 11/09/2009  . DIABETES, TYPE 1 05/14/2008  . OBSTRUCTIVE SLEEP APNEA 04/30/2008  . ALLERGIC RHINITIS, SEASONAL 04/30/2008    Past Surgical History:  Procedure Laterality Date  . AMPUTATION Left 03/08/2015   Procedure:  LEFT AMPUTATION BELOW KNEE;  Surgeon: Toni Arthurs, MD;  Location: [facility] ORS;  Service: Orthopedics;  Laterality: Left;  . APPLICATION OF WOUND VAC Left 01/07/2014  . CHOLECYSTECTOMY  06/2010  . CORONARY ANGIOPLASTY WITH STENT PLACEMENT  05/2009   "1"  . FOOT SURGERY Left 2010   "  for Charcot's joint"  . HARDWARE REMOVAL Left 01/07/2014   Procedure: LEFT LEG REMOVAL OF DEEP IMPLANT AND SEQUESTRECTOMY; APPLICATION OF WOUND VAC ;  Surgeon: Toni Arthurs, MD;  Location: [facility] OR;  Service: Orthopedics;  Laterality: Left;  . I&D EXTREMITY Left "multiple"   leg  . 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 IM NAILING TIBIA Left ~ 2012  . SHOULDER ARTHROSCOPY W/  ROTATOR CUFF REPAIR Left    "and bone spurs"  . TIBIAL IM ROD REMOVAL Left 01/07/2014  . TOE AMPUTATION Right ~ 2011   "great toe"  . VENA CAVA FILTER PLACEMENT  2012  . WOUND DEBRIDEMENT Left 01/07/2014   "tibia"       Home Medications    Prior to Admission medications   Medication Sig Start Date End Date Taking? Authorizing Provider  acetaminophen (TYLENOL) 325 MG tablet Take 650 mg by mouth every 6 (six) hours as needed for mild pain.   Yes [provider]  amLODipine (NORVASC) 5 MG tablet Take 5 mg by mouth daily. Take one tablet by mouth once daily for hypertension   Yes [provider]  Canagliflozin (INVOKANA) 100 MG TABS Take 100 mg by mouth See admin instructions. Alternating every other week between Jardiance and Invokana   Yes [provider]  diphenhydramine-acetaminophen (TYLENOL PM) 25-500 MG TABS tablet Take 1 tablet by mouth at bedtime.   Yes [provider]  docusate sodium (COLACE) 100 MG capsule Take 100 mg by mouth daily as needed (for constipation).    Yes [provider]  Empagliflozin (JARDIANCE PO) Take 1 tablet by mouth See admin instructions. *patient receives samples from MD* Alternating every other week between Jardiance and Invokana   Yes [provider]  ferrous sulfate 325 (65 FE) MG tablet Take 325 mg by mouth every evening.    Yes [provider]  gabapentin (NEURONTIN) 300 MG capsule Take 300 mg by mouth 3 (three) times daily.   Yes [provider]  insulin detemir (LEVEMIR) 100 UNIT/ML injection Inject 85 Units into the skin 2 (two) times daily.    Yes [provider]  insulin lispro (HUMALOG) 100 UNIT/ML injection Inject 0-10 Units into the skin 3 (three) times daily with meals. Inject SQ per sliding scale 150-250 (5 units), 251-350(8 units), 351-450(10 units), >450=10 units   Yes [provider]  metoCLOPramide (REGLAN) 10 MG tablet Take 10 mg by mouth at bedtime.     Yes [provider]  omeprazole (PRILOSEC) 20 MG capsule Take 20 mg by mouth daily.    Yes [provider]  pantoprazole (PROTONIX) 40 MG tablet Take 40 mg by mouth 2 (two) times daily as needed (for heartburn/indigestion).    Yes [provider]  pioglitazone (ACTOS) 30 MG tablet Take 30 mg by mouth at bedtime.    Yes [provider]  polyethylene glycol (MIRALAX / GLYCOLAX) packet Take 17 g by mouth daily. Patient taking differently: Take 17 g by mouth daily as needed for moderate constipation.  03/10/15  Yes Hollice Espy, MD  promethazine (PHENERGAN) 25 MG suppository Place 25 mg rectally every 6 (six) hours as needed for nausea or vomiting.   Yes [provider]  promethazine (PHENERGAN) 25 MG tablet Take 25 mg by mouth every 8 (eight) hours as needed for nausea or vomiting. Take one tablet  by mouth every 8 hours as needed nausea/vomiting   Yes [provider]  ranitidine (ZANTAC) 150 MG tablet Take 150 mg by mouth at bedtime.    Yes [provider]  simvastatin (ZOCOR) 40 MG tablet Take 60 mg by mouth daily at 6 PM. Take one and half tablet by mouth once daily in the evening for cholesterol   Yes [provider]    Family History Family History  Problem Relation Age of Onset  . COPD Mother   . Heart disease Mother   . Lung cancer Mother   . Retinal detachment Father   . Alcoholism Brother   . Heart disease Brother   . COPD Brother   . Diabetes Brother   . Hypertension Brother   . Stroke Brother     Social History Social History  Substance Use Topics  . Smoking status: Never Smoker  . Smokeless tobacco: Never Used  . Alcohol use Yes     Comment: 01/07/2014 'might have a wine cooler a couple times/yr"     Allergies   Codeine; Gabapentin; and Nabumetone   Review of Systems Review of Systems  Constitutional: Positive for appetite change. Negative for chills and fever.  HENT: Negative for ear pain,  rhinorrhea, sneezing and sore throat.   Eyes: Negative for photophobia and visual disturbance.  Respiratory: Positive for cough and shortness of breath. Negative for chest tightness and wheezing.   Cardiovascular: Negative for chest pain and palpitations.  Gastrointestinal: Positive for abdominal pain, nausea and vomiting. Negative for blood in stool, constipation and diarrhea.  Genitourinary: Negative for dysuria, hematuria and urgency.  Musculoskeletal: Negative for myalgias.  Skin: Negative for rash.  Neurological: Negative for dizziness, weakness and light-headedness.     Physical Exam Updated Vital Signs BP 121/87 (BP Location: Right Arm)   Pulse (!) 101   Temp 97.4 F (36.3 C) (Oral)   Resp (!) 22   Ht 6\' 3"  (1.905 m)   Wt (!) 141.5 kg (312 lb)   SpO2 96%   BMI 39.00 kg/m   Physical Exam  Constitutional: He appears well-developed and well-nourished. No distress.  HENT:  Head: Normocephalic and atraumatic.  Nose: Nose normal.  Eyes: Conjunctivae and EOM are normal. Right eye exhibits no discharge. Left eye exhibits no discharge. No scleral icterus.  Neck: Normal range of motion. Neck supple.  Cardiovascular: Normal rate, regular rhythm, normal heart sounds and intact distal pulses.  Exam reveals no gallop and no friction rub.   No murmur heard. Pulmonary/Chest: Effort normal and breath sounds normal. No respiratory distress.  Abdominal: Soft. Bowel sounds are normal. He exhibits no distension. There is tenderness (Mild epigastric). There is no guarding.  Musculoskeletal: Normal range of motion. He exhibits no edema.  Right-sided BKA.  Neurological: He is alert. He exhibits normal muscle tone. Coordination normal.  Skin: Skin is warm and dry. No rash noted.  Psychiatric: He has a normal mood and affect.  Nursing note and vitals reviewed.    ED Treatments / Results  Labs (all labs ordered are listed, but only abnormal results are displayed) Labs Reviewed    COMPREHENSIVE METABOLIC PANEL - Abnormal; Notable for the following:       Result Value   CO2 16 (*)    Glucose, Bld 253 (*)    BUN 22 (*)    Creatinine, Ser 1.36 (*)    Total Bilirubin 1.4 (*)    GFR calc non Af Amer 54 (*)    Anion  gap 21 (*)    All other components within normal limits  LIPASE, BLOOD  D-DIMER, QUANTITATIVE (NOT AT [facility])  CBC WITH DIFFERENTIAL/PLATELET  APTT  PROTIME-INR  ANTITHROMBIN III  PROTEIN C ACTIVITY  PROTEIN C, TOTAL  PROTEIN S ACTIVITY  PROTEIN S, TOTAL  LUPUS ANTICOAGULANT PANEL  BETA-2-GLYCOPROTEIN I ABS, IGG/M/A  HOMOCYSTEINE  FACTOR 5 LEIDEN  PROTHROMBIN GENE MUTATION  CARDIOLIPIN ANTIBODIES, IGG, IGM, IGA  HEPARIN LEVEL (UNFRACTIONATED)  CBC  BRAIN NATRIURETIC PEPTIDE  I-STAT TROPOININ, ED    EKG  EKG Interpretation None       Radiology Dg Chest 2 View  Result Date: 11/18/2016 CLINICAL DATA:  Subacute onset of nausea and vomiting. Shortness of breath. Initial encounter. EXAM: CHEST  2 VIEW COMPARISON:  Chest radiograph performed 03/07/2015 FINDINGS: The lungs are well-aerated. Left basilar airspace opacity raises concern for mild pneumonia. There is no evidence of pleural effusion or pneumothorax. The heart is borderline normal in size. No acute osseous abnormalities are seen. IMPRESSION: Left basilar airspace opacity raises concern for mild pneumonia. Electronically Signed   By: Roanna RaiderJeffery  Chang M.D.   On: 11/18/2016 19:38   Ct Angio Chest Pe W/cm &/or Wo Cm  Result Date: 11/18/2016 CLINICAL DATA:  Dyspnea x1 week EXAM: CT ANGIOGRAPHY CHEST WITH CONTRAST TECHNIQUE: Multidetector CT imaging of the chest was performed using the standard protocol during bolus administration of intravenous contrast. Multiplanar CT image reconstructions and MIPs were obtained to evaluate the vascular anatomy. CONTRAST:  100 cc Isovue 370 IV COMPARISON:  Chest CT 03/13/2011 FINDINGS: Cardiovascular: Tiny segmental and subsegmental pulmonary emboli to the lower  lobes with RV/LV ratio equaling 1.19 consistent with right heart strain. No pericardial effusion. No aortic aneurysm or dissection. Three-vessel coronary arteriosclerosis noted Mediastinum/Nodes: No enlarged mediastinal, hilar, or axillary lymph nodes. Thyroid gland, trachea, and esophagus demonstrate no significant findings. Lungs/Pleura: Subsegmental atelectasis bilaterally. No pneumonic consolidation, effusion or pneumothorax. No dominant mass. Upper Abdomen: No acute abnormality. Musculoskeletal: No chest wall abnormality. No acute or significant osseous findings. Review of the MIP images confirms the above findings. IMPRESSION: 1. Positive for acute PE with CT evidence of right heart strain (RV/LV Ratio = 1.19) consistent with at least submassive (intermediate risk) PE. The presence of right heart strain has been associated with an increased risk of morbidity and mortality. Please activate Code PE by paging [phone]. Critical Value/emergent results were called by telephone at the time of interpretation on 11/18/2016 at 8:57 pm to PA [facility] Dave Mannes , who verbally acknowledged these results. 2. Bibasilar subsegmental atelectasis.  No acute pulmonary disease. 3. Coronary arteriosclerosis. Electronically Signed   By: Tollie Ethavid  Kwon M.D.   On: 11/18/2016 20:58    Procedures Procedures (including critical care time)  Medications Ordered in ED Medications  heparin ADULT infusion 100 units/mL (25000 units/22550mL sodium chloride 0.45%) (1,900 Units/hr Intravenous New Bag/Given 11/18/16 2228)  sodium chloride 0.9 % bolus 1,000 mL (0 mLs Intravenous Stopped 11/18/16 2036)  promethazine (PHENERGAN) injection 12.5 mg (12.5 mg Intravenous Given 11/18/16 1912)  iopamidol (ISOVUE-370) 76 % injection (100 mLs Intravenous Contrast Given 11/18/16 2023)  heparin bolus via infusion 5,000 Units (5,000 Units Intravenous Bolus from Bag 11/18/16 2207)  promethazine (PHENERGAN) injection 12.5 mg (12.5 mg Intravenous Given 11/18/16 2229)      Initial Impression / Assessment and Plan / ED Course  I have reviewed the triage vital signs and the nursing notes.  Pertinent labs & imaging results that were available during my care of the patient were reviewed  by me and considered in my medical decision making (see chart for details).     Patient presents with concerns of shortness of breath and increased nausea and vomiting due to gastroparesis. He states this feels similar to his flares of gastroparesis which are usually improved with IV Phenergan since his by mouth Phenergan at home does not seem to be helping. Nausea and vomiting improved here with IV Phenergan 2. She was given fluids as well. Patient concerned about shortness of breath for the past week. Initial oxygen saturations were 89% and patient was placed on 2 L nasal cannula. Since then his saturations have been 95-96%.  He has a history of PE several years ago. Chest x-ray showed possible pneumonia. D-dimer negative. Troponin negative. CBC, lipase unremarkable. BUN Cr elevated at 22 and 1.36, this could be due to dehydration as this is elevated from his baseline. CTA showed acute PE with evidence of right heart strain (see above report for further detail). Critical care was consulted (Dr. Cherly Beach) and advised that we obtain 2D echo, start heparin and obtain coagulation panel. They advise that we admit to hospitalist team. Patient appears stable at this time although experiencing mild tachypnea. He remains satting at 96%. Heart rate remains upper 90s to 100. Heparin started after coagulation panel collected. Spoke to Dr. Jerolyn Center from hospitalist team who will admit patient.  Patient discussed with and seen by Dr. Rubin Payor.  Final Clinical Impressions(s) / ED Diagnoses   Final diagnoses:  Non-intractable vomiting with nausea, unspecified vomiting type  Other acute pulmonary embolism without acute cor pulmonale (HCC)  Gastroparesis    New Prescriptions New  Prescriptions   No medications on file     Dietrich Pates, PA-C 11/19/16 Ladean Raya, MD 11/19/16 [phone]

## 2016-11-18 NOTE — H&P (Signed)
History and Physical    Samuel [name] UJW:[id number] DOB: 12-11-1950 DOA: 11/18/2016  PCP: [name], MD   Patient coming from: Home   Chief Complaint: Shortness of breath, vomiting.  HPI: Samuel [name] is a 66 y.o. male with medical history significant of pulmonary embolism- 02/2011, DM 2 with gastroparesis, hypertension, left BKA, CAD, or OSA. Patient presented with a one-week history of shortness of breath present with exertion- getting out of bed using the commode. He denies any chest pain, has a mild nonproductive cough- 3-4 days. No fevers. He denies swelling of his legs, redness or warmth. He denies family history of coagulopathy. He denies weight loss, mother had lung cancer- at age 53. Patient is a nonsmoker. Nausea and vomiting worsened yesterday. At baseline he has nausea and vomiting from gastroparesis, for which he takes Phenergan are metoclopramide at home. He denies abdominal pain. Last bowel movement was yesterday- normal consistency.  Patient has a history of pulmonary embolism- 02/2011, admission, with extensive bilateral pulmonary embolism. He also had right lower extremity mobile DVT. He completed at least 6 months of Coumadin per chart (patient can't remember how long), and also had IVC filter placed.  ED Course: Patient was mildly hypoxic- saturating 89 %, requiring 2L of O2. Bp- 134/71, pulse- 98. RR- 22- 30. D-dimer was negative- 0.34, but CT showed chest showed- submassive PE, with CT evidence of right heart strain. PCCM- Dr. Arsenio Loader- was consulted, with recommendations for hospitalist to admit, IV heparin, 2D echo, lower extremity dopplers, and full coag panel before heparin is started.  Review of Systems: CONSTITUTIONAL- No Fever, weightloss, or night sweat. SKIN- No Rash, colour changes or itching. HEAD- Mild frontal Headache in ED EYES- No Vision loss, pain, redness, double or blurred vision. CARDIAC- No Palpitations or chest pain. URINARY- No Frequency, urgency,  straining or dysuria. NEUROLOGIC- No Numbness, syncope, seizures or burning. [facility]- Denies depression or anxiety.  Past Medical History:  Diagnosis Date  . Anxiety   . Arthritis    "all over"   . CAD (coronary artery disease)   . Charcot's joint    "left foot"  . 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 Depression   . Gastroparesis   . GERD (gastroesophageal reflux disease)   . H/O hiatal hernia   . Hyperlipidemia   . Hypertension   . OSA (obstructive sleep apnea)    "not bad enough for a mask"  . Peripheral neuropathy   . Peripheral vascular disease (HCC)   . PONV (postoperative nausea and vomiting)   . Pulmonary embolism (HCC)    hx. of 2012  . Shortness of breath    exertion  . Type II diabetes mellitus (HCC)     Past Surgical History:  Procedure Laterality Date  . AMPUTATION Left 03/08/2015   Procedure:  LEFT AMPUTATION BELOW KNEE;  Surgeon: Toni Arthurs, MD;  Location: [facility];  Service: Orthopedics;  Laterality: Left;  . APPLICATION OF WOUND VAC Left 01/07/2014  . CHOLECYSTECTOMY  06/2010  . CORONARY ANGIOPLASTY WITH STENT PLACEMENT  05/2009   "1"  . FOOT SURGERY Left 2010   "for Charcot's joint"  . HARDWARE REMOVAL Left 01/07/2014   Procedure: LEFT LEG REMOVAL OF DEEP IMPLANT AND SEQUESTRECTOMY; APPLICATION OF WOUND VAC ;  Surgeon: Toni Arthurs, MD;  Location: [facility] OR;  Service: Orthopedics;  Laterality: Left;  . I&D EXTREMITY Left "multiple"   leg  . I&D EXTREMITY Left 01/07/2014   Procedure: IRRIGATION AND DEBRIDEMENT OF  CHRONIC TIBIAL ULCER;  Surgeon: Toni Arthurs, MD;  Location: [facility] OR;  Service: Orthopedics;  Laterality: Left;  . IM NAILING TIBIA Left ~ 2012  . SHOULDER ARTHROSCOPY W/ ROTATOR CUFF REPAIR Left    "and bone spurs"  . TIBIAL IM ROD REMOVAL Left 01/07/2014  . TOE AMPUTATION Right ~ 2011   "great toe"  . VENA CAVA FILTER PLACEMENT  2012  . WOUND DEBRIDEMENT Left 01/07/2014   "tibia"     reports that he has never smoked. He has  never used smokeless tobacco. He reports that he drinks alcohol. He reports that he does not use drugs.  Allergies  Allergen Reactions  . Codeine Other (See Comments)    REACTION: GI Upset  . Gabapentin Hives, Itching and Rash    REACTION: rash/hives (per patient, was a reaction to an inactive ingredient in another mgf brand). The patient stated that he does take this medication now and it doesn't cause a rash any more.  . Nabumetone Itching, Rash and Other (See Comments)    REACTION: sick on stomach and rash    Family History  Problem Relation Age of Onset  . COPD Mother   . Heart disease Mother   . Lung cancer Mother   . Retinal detachment Father   . Alcoholism Brother   . Heart disease Brother   . COPD Brother   . Diabetes Brother   . Hypertension Brother   . Stroke Brother     Prior to Admission medications   Medication Sig Start Date End Date Taking? Authorizing Provider  acetaminophen (TYLENOL) 325 MG tablet Take 650 mg by mouth every 6 (six) hours as needed for mild pain.   Yes [provider]  amLODipine (NORVASC) 5 MG tablet Take 5 mg by mouth daily. Take one tablet by mouth once daily for hypertension   Yes [provider]  Canagliflozin (INVOKANA) 100 MG TABS Take 100 mg by mouth See admin instructions. Alternating every other week between Jardiance and Invokana   Yes [provider]  diphenhydramine-acetaminophen (TYLENOL PM) 25-500 MG TABS tablet Take 1 tablet by mouth at bedtime.   Yes [provider]  docusate sodium (COLACE) 100 MG capsule Take 100 mg by mouth daily as needed (for constipation).    Yes [provider]  Empagliflozin (JARDIANCE PO) Take 1 tablet by mouth See admin instructions. *patient receives samples from MD* Alternating every other week between Jardiance and Invokana   Yes [provider]  gabapentin (NEURONTIN) 300 MG capsule Take 300 mg by mouth 3 (three) times daily.   Yes [provider]  insulin detemir (LEVEMIR) 100 UNIT/ML injection Inject 85 Units into the skin 2 (two) times daily.    Yes [provider]  insulin lispro (HUMALOG) 100 UNIT/ML injection Inject 0-10 Units into the skin 3 (three) times daily with meals. Inject SQ per sliding scale 150-250 (5 units), 251-350(8 units), 351-450(10 units), >450=10 units   Yes [provider]  metoCLOPramide (REGLAN) 10 MG tablet Take 10 mg by mouth at bedtime.    Yes [provider]  omeprazole (PRILOSEC) 20 MG capsule Take 20 mg by mouth daily.    Yes [provider]  pioglitazone (ACTOS) 30 MG tablet Take 30 mg by mouth at bedtime.    Yes [provider]  polyethylene glycol (MIRALAX / GLYCOLAX) packet Take 17 g by mouth daily. Patient taking differently: Take 17 g by mouth daily as needed for moderate constipation.  03/10/15  Yes Hollice Espy, MD  promethazine (PHENERGAN) 25 MG suppository Place 25 mg rectally every 6 (six) hours as needed for nausea or vomiting.   Yes [provider]  promethazine (PHENERGAN) 25 MG tablet Take 25 mg by mouth every 8 (eight) hours as needed for nausea or vomiting. Take one tablet by mouth every 8 hours as needed nausea/vomiting   Yes [provider]  ranitidine (ZANTAC) 150 MG tablet Take 150 mg by mouth at bedtime.    Yes [provider]  simvastatin (ZOCOR) 40 MG tablet Take 60 mg by mouth daily at 6 PM. Take one and half tablet by mouth once daily in the evening for cholesterol   Yes [provider]  ferrous sulfate 325 (65 FE) MG tablet Take 325 mg by mouth every evening.     [provider]  pantoprazole (PROTONIX) 40 MG tablet Take 40 mg by mouth 2 (two) times daily as needed (for heartburn/indigestion).     [provider]   Physical Exam: Vitals:   11/18/16 1809 11/18/16 1947 11/18/16 2119 11/18/16 2142  BP: 134/71 (!) 144/71 131/63   Pulse: 98 98 100   Resp: (!) 30 (!) 25  (!) 22   Temp: 97.4 F (36.3 C)     TempSrc: Oral     SpO2: 97% 95% 95%   Weight:    (!) 141.5 kg (312 lb)  Height:    6\' 3"  (1.905 m)    Constitutional: NAD, calm, comfortable, obese Vitals:   11/18/16 1809 11/18/16 1947 11/18/16 2119 11/18/16 2142  BP: 134/71 (!) 144/71 131/63   Pulse: 98 98 100   Resp: (!) 30 (!) 25 (!) 22   Temp: 97.4 F (36.3 C)     TempSrc: Oral     SpO2: 97% 95% 95%   Weight:    (!) 141.5 kg (312 lb)  Height:    6\' 3"  (1.905 m)   Eyes: PERRL, lids and conjunctivae normal ENMT: Mucous membranes are dry. Posterior pharynx clear of any exudate or lesions.Normal dentition.  Neck: normal, supple, no masses, no thyromegaly Respiratory: clear to auscultation bilaterally, no wheezing, no crackles. Normal respiratory effort. No accessory muscle use. On 2L [place]. Cardiovascular: mild tachycardia, regular rate and rhythm, no murmurs / rubs / gallops. No extremity edema. 2+ pedal pulses. No carotid bruits.  Abdomen: obese, no tenderness, no masses palpated. No hepatosplenomegaly. Bowel sounds positive.  Musculoskeletal: no clubbing / cyanosis. Left BKA, right big toe amputation. Good ROM, no contractures. Normal muscle tone.  Skin: no rashes, lesions, ulcers. No induration Neurologic: CN 2-12 grossly intact. Strength equal all extremities. Psychiatric: Normal judgment and insight. Alert and oriented x 3. Normal mood.   Labs on Admission: I have personally reviewed following labs and imaging studies  CBC:  Recent Labs Lab 11/18/16 1853  WBC 9.2  NEUTROABS 7.7  HGB 15.2  HCT 44.2  MCV 91.3  PLT 227   Basic Metabolic Panel:  Recent Labs Lab 11/18/16 1853  NA 138  K 4.6  CL 101  CO2 16*  GLUCOSE 253*  BUN 22*  CREATININE 1.36*  CALCIUM 9.7   Liver Function Tests:  Recent Labs Lab 11/18/16 1853  AST 19  ALT 21  ALKPHOS 52  BILITOT 1.4*  PROT 8.0  ALBUMIN 3.9    Recent Labs Lab 11/18/16 1853  LIPASE 17   Coagulation Profile:  Recent  Labs Lab 11/18/16 1853  INR 1.04   Urine analysis:    Component  Value Date/Time   COLORURINE AMBER (A) 04/05/2015 1155   APPEARANCEUR CLEAR 04/05/2015 1155   LABSPEC 1.027 04/05/2015 1155   PHURINE 5.5 04/05/2015 1155   GLUCOSEU >1000 (A) 04/05/2015 1155   HGBUR NEGATIVE 04/05/2015 1155   BILIRUBINUR SMALL (A) 04/05/2015 1155   KETONESUR NEGATIVE 04/05/2015 1155   PROTEINUR 30 (A) 04/05/2015 1155   UROBILINOGEN 1.0 04/05/2015 1155   NITRITE NEGATIVE 04/05/2015 1155   LEUKOCYTESUR NEGATIVE 04/05/2015 1155    Radiological Exams on Admission: Dg Chest 2 View  Result Date: 11/18/2016 CLINICAL DATA:  Subacute onset of nausea and vomiting. Shortness of breath. Initial encounter. EXAM: CHEST  2 VIEW COMPARISON:  Chest radiograph performed 03/07/2015 FINDINGS: The lungs are well-aerated. Left basilar airspace opacity raises concern for mild pneumonia. There is no evidence of pleural effusion or pneumothorax. The heart is borderline normal in size. No acute osseous abnormalities are seen. IMPRESSION: Left basilar airspace opacity raises concern for mild pneumonia. Electronically Signed   By: Roanna RaiderJeffery  Chang M.D.   On: 11/18/2016 19:38   Ct Angio Chest Pe W/cm &/or Wo Cm  Result Date: 11/18/2016 CLINICAL DATA:  Dyspnea x1 week EXAM: CT ANGIOGRAPHY CHEST WITH CONTRAST TECHNIQUE: Multidetector CT imaging of the chest was performed using the standard protocol during bolus administration of intravenous contrast. Multiplanar CT image reconstructions and MIPs were obtained to evaluate the vascular anatomy. CONTRAST:  100 cc Isovue 370 IV COMPARISON:  Chest CT 03/13/2011 FINDINGS: Cardiovascular: Tiny segmental and subsegmental pulmonary emboli to the lower lobes with RV/LV ratio equaling 1.19 consistent with right heart strain. No pericardial effusion. No aortic aneurysm or dissection. Three-vessel coronary arteriosclerosis noted Mediastinum/Nodes: No enlarged mediastinal, hilar, or axillary lymph nodes.  Thyroid gland, trachea, and esophagus demonstrate no significant findings. Lungs/Pleura: Subsegmental atelectasis bilaterally. No pneumonic consolidation, effusion or pneumothorax. No dominant mass. Upper Abdomen: No acute abnormality. Musculoskeletal: No chest wall abnormality. No acute or significant osseous findings. Review of the MIP images confirms the above findings. IMPRESSION: 1. Positive for acute PE with CT evidence of right heart strain (RV/LV Ratio = 1.19) consistent with at least submassive (intermediate risk) PE. The presence of right heart strain has been associated with an increased risk of morbidity and mortality. Please activate Code PE by paging [phone]. Critical Value/emergent results were called by telephone at the time of interpretation on 11/18/2016 at 8:57 pm to [facility] , who verbally acknowledged these results. 2. Bibasilar subsegmental atelectasis.  No acute pulmonary disease. 3. Coronary arteriosclerosis. Electronically Signed   By: Tollie Ethavid  Kwon M.D.   On: 11/18/2016 20:58    EKG: rate- 97, T-wave flattening in aVL, otherwise no significant change from prior.  Assessment/Plan Principal Problem:   Pulmonary embolism (HCC) Active Problems:   OBSTRUCTIVE SLEEP APNEA   Coronary artery disease   Essential hypertension   Gastroparesis   Diabetes mellitus type 2, uncontrolled (HCC)   Obesity (BMI 30-39.9)  Pulmonary embolism - CT revealed submassive PE, with CT evidence of right heart strain, interestingly D-dimer negative. Hgb- stable at 15.2. He has a mild tachycardia, blood pressure stable, on 2L o2- sats ~95-97%. History of PE- 2012, with DVT, was treated with At least 6 months of anticoagulation- coumadin, IVC filter was discontinued ~363months later. - Admit to stepdown - Continue IV heparin, per pharmacy - Echo - Bilateral lower extremity dopplers - Will make pt NPO for now, considering his cardiorespiratory status. - Patient received 1 L bolus in the ED,  continue normal saline 100 cc per hour  for 12 hours. - f/u coag panel drawn in ED prior to heparin - BNP- to help with prognostication  Gastroparesis- medications include Phenergan and metoclopramide. - IV Zofran -cont IVF N/s, while NPO, dry mucous membranes, poor PO intake, vomiting.  Hypertension- home meds Norvasc 5. - Hold Norvasc 5, with submassive PE, incase blood pressure drops due to burden of clot.  DM2- home medication- pioglitazone, Levemir 85 units twice a day, lispro sliding scale, he alternates between jardiance and invocana, depending on what samples he is given by his provider. Last Hgba1c- 7.7- 12/2013. 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 Pt lives at home alone, ambulates with walker-has a prosthetic leg, or wheelchair.  CAD- history. No chest pain. SOB most likely due to submassive PE. Prostatic troponin negative. EKG- T wave flattening in aVL, otherwise no significant change from prior. - Continue home Zocor  DVT prophylaxis: Heparin Code Status: Full Family Communication: None at bedside Disposition Plan: Home  Consults called:  None.  Admission status: Inpt, step down.  Onnie Boer MD [facility] Pager [phone]  If 2am-7AM, please contact night-coverage [email or web address] Password [email or web address]  11/18/2016, 11:04 PM

## 2016-11-18 NOTE — ED Triage Notes (Signed)
Per EMS:  Pt is coming from home. Pt has been having increased nausea and has vomited several time. Hx of gastroparesis. Pt did not take his phenergan in time and the nausea overwhelmed him.  Pt has not vomited with EMS.  Pt has also been having SOB for about a week.   RR 36 Lung sounds were clear but a hx of PE.  20 Right hand  No dizziness or blurred vision.   150/116 100 Pulse 36 RR 96% on RA, 2L for comfort at 99% CBG 217

## 2016-11-18 NOTE — ED Notes (Signed)
ED Provider at bedside. 

## 2016-11-18 NOTE — ED Notes (Signed)
Patient transported to CT 

## 2016-11-18 NOTE — ED Notes (Signed)
Bed: WA03 Expected date:  Expected time:  Means of arrival:  Comments: 66 yo N/V

## 2016-11-18 NOTE — ED Notes (Signed)
Pt requesting phenergan for nausea, PA made aware

## 2016-11-18 NOTE — Progress Notes (Signed)
ANTICOAGULATION CONSULT NOTE - Initial Consult  Pharmacy Consult for IV heparin Indication: pulmonary embolus  Allergies  Allergen Reactions  . Codeine Other (See Comments)    REACTION: GI Upset  . Gabapentin Hives, Itching and Rash    REACTION: rash/hives (per patient, was a reaction to an inactive ingredient in another mgf brand). The patient stated that he does take this medication now and it doesn't cause a rash any more.  . Nabumetone Itching, Rash and Other (See Comments)    REACTION: sick on stomach and rash    Patient Measurements: Height: 6\' 3"  (190.5 cm) Weight: (!) 312 lb (141.5 kg) IBW/kg (Calculated) : 84.5 Heparin Dosing Weight: 101 kg  Vital Signs: Temp: 97.4 F (36.3 C) (06/03 1809) Temp Source: Oral (06/03 1809) BP: 131/63 (06/03 2119) Pulse Rate: 100 (06/03 2119)  Labs:  Recent Labs  11/18/16 1853  HGB 15.2  HCT 44.2  PLT 227  CREATININE 1.36*    Estimated Creatinine Clearance: 84.4 mL/min (A) (by C-G formula based on SCr of 1.36 mg/dL (H)).   Medical History: Past Medical History:  Diagnosis Date  . Anxiety   . Arthritis    "all over"   . CAD (coronary artery disease)   . Charcot's joint    "left foot"  . 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 Depression   . Gastroparesis   . GERD (gastroesophageal reflux disease)   . H/O hiatal hernia   . Hyperlipidemia   . Hypertension   . OSA (obstructive sleep apnea)    "not bad enough for a mask"  . Peripheral neuropathy   . Peripheral vascular disease (HCC)   . PONV (postoperative nausea and vomiting)   . Pulmonary embolism (HCC)    hx. of 2012  . Shortness of breath    exertion  . Type II diabetes mellitus (HCC)     Medications:  Scheduled:  . heparin  5,000 Units Intravenous Once   Infusions:  . heparin      Assessment: 65 yoM c/o nausea, vomiting and SOB.  CT reveals acute PE with evidence of right heart strain.  IV Heparin for PE.  Goal of Therapy:   Heparin level 0.3-0.7 units/ml Monitor platelets by anticoagulation protocol: Yes   Plan:  Baseline coags STAT Heparin 5000 unit bolus x1 Start drip at 1900 units/hr Daily CBC/HL Check 1st HL in 6 hours   Thanks Lorenza EvangelistGreen, Dyllen Menning R 11/18/2016 9:55 PM   Lorenza EvangelistGreen, Imaan Padgett R 11/18/2016,9:52 PM

## 2016-11-19 ENCOUNTER — Inpatient Hospital Stay (HOSPITAL_COMMUNITY): Payer: PPO

## 2016-11-19 DIAGNOSIS — Z794 Long term (current) use of insulin: Secondary | ICD-10-CM

## 2016-11-19 DIAGNOSIS — I2782 Chronic pulmonary embolism: Secondary | ICD-10-CM

## 2016-11-19 DIAGNOSIS — E1140 Type 2 diabetes mellitus with diabetic neuropathy, unspecified: Secondary | ICD-10-CM

## 2016-11-19 DIAGNOSIS — E1165 Type 2 diabetes mellitus with hyperglycemia: Secondary | ICD-10-CM

## 2016-11-19 DIAGNOSIS — I2699 Other pulmonary embolism without acute cor pulmonale: Secondary | ICD-10-CM

## 2016-11-19 DIAGNOSIS — Z86711 Personal history of pulmonary embolism: Secondary | ICD-10-CM

## 2016-11-19 DIAGNOSIS — R0602 Shortness of breath: Secondary | ICD-10-CM

## 2016-11-19 LAB — GLUCOSE, CAPILLARY
GLUCOSE-CAPILLARY: 194 mg/dL — AB (ref 65–99)
GLUCOSE-CAPILLARY: 183 mg/dL — AB (ref 65–99)
GLUCOSE-CAPILLARY: 171 mg/dL — AB (ref 65–99)
Glucose-Capillary: 152 mg/dL — ABNORMAL HIGH (ref 65–99)

## 2016-11-19 LAB — CBC
HCT: 42 % (ref 39.0–52.0)
Hemoglobin: 14 g/dL (ref 13.0–17.0)
MCH: 30.8 pg (ref 26.0–34.0)
MCHC: 33.3 g/dL (ref 30.0–36.0)
MCV: 92.3 fL (ref 78.0–100.0)
PLATELETS: 219 10*3/uL (ref 150–400)
RBC: 4.55 MIL/uL (ref 4.22–5.81)
RDW: 13.2 % (ref 11.5–15.5)
WBC: 9.6 10*3/uL (ref 4.0–10.5)

## 2016-11-19 LAB — BASIC METABOLIC PANEL
Anion gap: 14 (ref 5–15)
BUN: 23 mg/dL — AB (ref 6–20)
CO2: 19 mmol/L — AB (ref 22–32)
Calcium: 9.2 mg/dL (ref 8.9–10.3)
Chloride: 107 mmol/L (ref 101–111)
Creatinine, Ser: 1.38 mg/dL — ABNORMAL HIGH (ref 0.61–1.24)
GFR calc Af Amer: >60 mL/min (ref >60)
GFR, EST NON AFRICAN AMERICAN: 53 mL/min — AB (ref >60)
GLUCOSE: 201 mg/dL — AB (ref 65–99)
Potassium: 4.2 mmol/L (ref 3.5–5.1)
Sodium: 140 mmol/L (ref 135–145)

## 2016-11-19 LAB — HEPARIN LEVEL (UNFRACTIONATED)
HEPARIN UNFRACTIONATED: 0.9 [IU]/mL — AB (ref 0.30–0.70)
HEPARIN UNFRACTIONATED: 0.64 [IU]/mL (ref 0.30–0.70)
Heparin Unfractionated: 0.94 IU/mL — ABNORMAL HIGH (ref 0.30–0.70)

## 2016-11-19 LAB — ANTITHROMBIN III: ANTITHROMB III FUNC: 103 % (ref 75–120)

## 2016-11-19 LAB — ECHOCARDIOGRAM COMPLETE
Height: 75 in
Weight: 4578.51 oz

## 2016-11-19 LAB — BRAIN NATRIURETIC PEPTIDE: B NATRIURETIC PEPTIDE 5: 62.1 pg/mL (ref 0.0–100.0)

## 2016-11-19 LAB — MRSA PCR SCREENING: MRSA BY PCR: NEGATIVE

## 2016-11-19 LAB — HIV ANTIBODY (ROUTINE TESTING W REFLEX): HIV SCREEN 4TH GENERATION: NONREACTIVE

## 2016-11-19 MED ORDER — ONDANSETRON HCL 4 MG/2ML IJ SOLN
4.0000 mg | Freq: Four times a day (QID) | INTRAMUSCULAR | Status: DC | PRN
  Administered 2016-11-20 – 2016-11-21 (×3): 4 mg via INTRAVENOUS
  Filled 2016-11-19 (×3): qty 2

## 2016-11-19 MED ORDER — ONDANSETRON HCL 4 MG PO TABS: 4.0000 mg | ORAL_TABLET | Freq: Four times a day (QID) | ORAL | Status: DC | PRN

## 2016-11-19 MED ORDER — FERROUS SULFATE 325 (65 FE) MG PO TABS
325.0000 mg | ORAL_TABLET | Freq: Every evening | ORAL | Status: DC
  Administered 2016-11-19 – 2016-11-20 (×2): 325 mg via ORAL
  Filled 2016-11-19 (×3): qty 1

## 2016-11-19 MED ORDER — GABAPENTIN 300 MG PO CAPS
300.0000 mg | ORAL_CAPSULE | Freq: Three times a day (TID) | ORAL | Status: DC
  Administered 2016-11-19 – 2016-11-21 (×5): 300 mg via ORAL
  Filled 2016-11-19 (×6): qty 1

## 2016-11-19 MED ORDER — SIMVASTATIN 20 MG PO TABS
60.0000 mg | ORAL_TABLET | Freq: Every day | ORAL | Status: DC
  Administered 2016-11-19: 60 mg via ORAL
  Filled 2016-11-19: qty 3
  Filled 2016-11-19: qty 6
  Filled 2016-11-19: qty 3

## 2016-11-19 MED ORDER — HEPARIN (PORCINE) IN NACL 100-0.45 UNIT/ML-% IJ SOLN
1700.0000 [IU]/h | INTRAMUSCULAR | Status: DC
  Administered 2016-11-19: 1700 [IU]/h via INTRAVENOUS

## 2016-11-19 MED ORDER — HEPARIN (PORCINE) IN NACL 100-0.45 UNIT/ML-% IJ SOLN
1450.0000 [IU]/h | INTRAMUSCULAR | Status: DC
  Administered 2016-11-19: 1450 [IU]/h via INTRAVENOUS
  Filled 2016-11-19 (×2): qty 250

## 2016-11-19 MED ORDER — PANTOPRAZOLE SODIUM 40 MG PO TBEC: 40.0000 mg | DELAYED_RELEASE_TABLET | Freq: Two times a day (BID) | ORAL | Status: DC | PRN

## 2016-11-19 MED ORDER — PERFLUTREN LIPID MICROSPHERE
1.0000 mL | INTRAVENOUS | Status: AC | PRN
  Administered 2016-11-19: 2 mL via INTRAVENOUS
  Filled 2016-11-19: qty 10

## 2016-11-19 MED ORDER — SODIUM CHLORIDE 0.9 % IV SOLN
INTRAVENOUS | Status: AC
  Administered 2016-11-19 (×2): via INTRAVENOUS

## 2016-11-19 MED ORDER — INSULIN ASPART 100 UNIT/ML ~~LOC~~ SOLN
0.0000 [IU] | Freq: Three times a day (TID) | SUBCUTANEOUS | Status: DC
  Administered 2016-11-19 (×3): 2 [IU] via SUBCUTANEOUS
  Administered 2016-11-20 – 2016-11-21 (×3): 1 [IU] via SUBCUTANEOUS
  Administered 2016-11-21: 2 [IU] via SUBCUTANEOUS

## 2016-11-19 MED ORDER — SODIUM CHLORIDE 0.9% FLUSH
3.0000 mL | Freq: Two times a day (BID) | INTRAVENOUS | Status: DC
  Administered 2016-11-19 – 2016-11-21 (×3): 3 mL via INTRAVENOUS

## 2016-11-19 MED ORDER — TRAMADOL HCL 50 MG PO TABS
100.0000 mg | ORAL_TABLET | Freq: Two times a day (BID) | ORAL | Status: DC | PRN
  Administered 2016-11-19 – 2016-11-20 (×2): 100 mg via ORAL
  Filled 2016-11-19 (×3): qty 2

## 2016-11-19 MED ORDER — INSULIN DETEMIR 100 UNIT/ML ~~LOC~~ SOLN
40.0000 [IU] | Freq: Two times a day (BID) | SUBCUTANEOUS | Status: DC
  Administered 2016-11-19 – 2016-11-21 (×5): 40 [IU] via SUBCUTANEOUS
  Filled 2016-11-19 (×7): qty 0.4

## 2016-11-19 NOTE — Progress Notes (Signed)
Dr Edward JollySilva notifed of patients request for tramadol for arthritic pain in hips.

## 2016-11-19 NOTE — Progress Notes (Signed)
ANTICOAGULATION CONSULT NOTE - Follow Up Consult  Pharmacy Consult for heparin Indication: new pulmonary embolus  Allergies  Allergen Reactions  . Codeine Other (See Comments)    REACTION: GI Upset  . Gabapentin Hives, Itching and Rash    REACTION: rash/hives (per patient, was a reaction to an inactive ingredient in another mgf brand). The patient stated that he does take this medication now and it doesn't cause a rash any more.  . Nabumetone Itching, Rash and Other (See Comments)    REACTION: sick on stomach and rash    Patient Measurements: Height: 6\' 3"  (190.5 cm) Weight: 286 lb 2.5 oz (129.8 kg) IBW/kg (Calculated) : 84.5 Heparin Dosing Weight: 113 kg  Vital Signs: Temp: 97.5 F (36.4 C) (06/04 2033) Temp Source: Oral (06/04 2033) BP: 135/72 (06/04 2033) Pulse Rate: 80 (06/04 2033)  Labs:  Recent Labs  11/18/16 1853 11/19/16 0513 11/19/16 1324 11/19/16 2119  HGB 15.2 14.0  --   --   HCT 44.2 42.0  --   --   PLT 227 219  --   --   APTT 27  --   --   --   LABPROT 13.7  --   --   --   INR 1.04  --   --   --   HEPARINUNFRC  --  0.94* 0.90* 0.64  CREATININE 1.36* 1.38*  --   --     Estimated Creatinine Clearance: 79.5 mL/min (A) (by C-G formula based on SCr of 1.38 mg/dL (H)).   Assessment: Patient's a 66 y.o M with hx PE[facility]DVT [facility] presented to the ED on 11/18/16 with c/o SOB.  Chest CTA showed new PE with right heart strain  consistent with at submassive PE.  Heparin drip started on admission for PE.  LE doppler on 11/19/16 neg for DVT.  Today, 11/19/2016: - heparin level now at goal at 0.6 on 1450 units/hr - cbc stable - no bleeding documented   Goal of Therapy:  Heparin level 0.3-0.7 units/ml Monitor platelets by anticoagulation protocol: Yes   Plan:  - continue heparin rate at 1450 units/hr - check 6 hr heparin level - monitor for s/s bleeding  Sheppard CoilFrank Wilson PharmD., BCPS Clinical Pharmacist Pager [phone] 11/19/2016 10:31 PM

## 2016-11-19 NOTE — Care Management Note (Signed)
Case Management Note  Patient Details  Name: Samuel [name] MRN: [id number] Date of Birth: Apr 06, 1951  Subjective/Objective:                  Shortness of breath, vomiting  Action/Plan: Date:  November 19, 2016 Chart reviewed for concurrent status and case management needs. Will continue to follow patient progress. Discharge Planning: following for needs Expected discharge date: [id number] Marcelle SmilingRhonda Davis, BSN, [place]RN3, [place]M   [phone]  Expected Discharge Date:                  Expected Discharge Plan:  Home/Self Care  In-House Referral:     Discharge planning Services  CM Consult  Post Acute Care Choice:    Choice offered to:     DME Arranged:    DME Agency:     HH Arranged:    HH Agency:     Status of Service:  In process, will continue to follow  If discussed at Long Length of Stay Meetings, dates discussed:    Additional Comments:  Golda AcreDavis, Rhonda Lynn, RN 11/19/2016, 8:53 AM

## 2016-11-19 NOTE — Progress Notes (Signed)
ANTICOAGULATION CONSULT NOTE - Follow Up Consult  Pharmacy Consult for heparin Indication: new pulmonary embolus  Allergies  Allergen Reactions  . Codeine Other (See Comments)    REACTION: GI Upset  . Gabapentin Hives, Itching and Rash    REACTION: rash/hives (per patient, was a reaction to an inactive ingredient in another mgf brand). The patient stated that he does take this medication now and it doesn't cause a rash any more.  . Nabumetone Itching, Rash and Other (See Comments)    REACTION: sick on stomach and rash    Patient Measurements: Height: 6\' 3"  (190.5 cm) Weight: 286 lb 2.5 oz (129.8 kg) IBW/kg (Calculated) : 84.5 Heparin Dosing Weight: 113 kg  Vital Signs: Temp: 98 F (36.7 C) (06/04 1200) Temp Source: Oral (06/04 1200) BP: 135/45 (06/04 1200) Pulse Rate: 79 (06/04 1300)  Labs:  Recent Labs  11/18/16 1853 11/19/16 0513  HGB 15.2 14.0  HCT 44.2 42.0  PLT 227 219  APTT 27  --   LABPROT 13.7  --   INR 1.04  --   HEPARINUNFRC  --  0.94*  CREATININE 1.36* 1.38*    Estimated Creatinine Clearance: 79.5 mL/min (A) (by C-G formula based on SCr of 1.38 mg/dL (H)).   Assessment: Patient's a 66 y.o M with hx PE[facility]DVT ([facility] presented to the ED on 11/18/16 with c/o SOB.  Chest CTA showed new PE with right heart strain  consistent with at submassive PE.  Heparin drip started on admission for PE.  LE doppler on 11/19/16 neg for DVT.  Today, 11/19/2016: - heparin level remains supratherapeutic at 0.90 despite rate decreased to 1700 units/hr this morning. - cbc stable - no bleeding documented   Goal of Therapy:  Heparin level 0.3-0.7 units/ml Monitor platelets by anticoagulation protocol: Yes   Plan:  - decrease heparin rate to 1450 units/hr - check 6 hr heparin level - monitor for s/s bleeding  Shellby Schlink P 11/19/2016,1:49 PM

## 2016-11-19 NOTE — Progress Notes (Signed)
ANTICOAGULATION CONSULT NOTE - f/u Consult  Pharmacy Consult for IV heparin Indication: pulmonary embolus  Allergies  Allergen Reactions  . Codeine Other (See Comments)    REACTION: GI Upset  . Gabapentin Hives, Itching and Rash    REACTION: rash/hives (per patient, was a reaction to an inactive ingredient in another mgf brand). The patient stated that he does take this medication now and it doesn't cause a rash any more.  . Nabumetone Itching, Rash and Other (See Comments)    REACTION: sick on stomach and rash    Patient Measurements: Height: 6\' 3"  (190.5 cm) Weight: 286 lb 2.5 oz (129.8 kg) IBW/kg (Calculated) : 84.5 Heparin Dosing Weight: 101 kg  Vital Signs: Temp: 98.1 F (36.7 C) (06/04 0311) Temp Source: Axillary (06/04 0311) BP: 106/49 (06/04 0600) Pulse Rate: 90 (06/04 0600)  Labs:  Recent Labs  11/18/16 1853 11/19/16 0513  HGB 15.2 14.0  HCT 44.2 42.0  PLT 227 219  APTT 27  --   LABPROT 13.7  --   INR 1.04  --   HEPARINUNFRC  --  0.94*  CREATININE 1.36* 1.38*    Estimated Creatinine Clearance: 79.5 mL/min (A) (by C-G formula based on SCr of 1.38 mg/dL (H)).   Medical History: Past Medical History:  Diagnosis Date  . Anxiety   . Arthritis    "all over"   . CAD (coronary artery disease)   . Charcot's joint    "left foot"  . 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 Depression   . Gastroparesis   . GERD (gastroesophageal reflux disease)   . H/O hiatal hernia   . Hyperlipidemia   . Hypertension   . OSA (obstructive sleep apnea)    "not bad enough for a mask"  . Peripheral neuropathy   . Peripheral vascular disease (HCC)   . PONV (postoperative nausea and vomiting)   . Pulmonary embolism (HCC)    hx. of 2012  . Shortness of breath    exertion  . Type II diabetes mellitus (HCC)     Medications:  Scheduled:  . ferrous sulfate  325 mg Oral QPM  . gabapentin  300 mg Oral TID  . insulin aspart  0-9 Units Subcutaneous TID  WC  . insulin detemir  40 Units Subcutaneous BID  . simvastatin  60 mg Oral q1800  . sodium chloride flush  3 mL Intravenous Q12H   Infusions:  . sodium chloride 100 mL/hr at 11/19/16 0123  . heparin      Assessment: 65 yoM c/o nausea, vomiting and SOB.  CT reveals acute PE with evidence of right heart strain.  IV Heparin for PE.  6/4  0513 HL=0.94 above goal, no infusion or bleeding issues per RN  Goal of Therapy:  Heparin level 0.3-0.7 units/ml Monitor platelets by anticoagulation protocol: Yes   Plan:   Decrease heparin drip to 1700 units/hr Daily CBC/HL Check HL in 6 hours   Thanks Lorenza EvangelistGreen, Shalea Tomczak R 11/19/2016,6:49 AM

## 2016-11-19 NOTE — Consult Note (Signed)
[facility]  Patient name: Samuel BusingJohn D Claycomb MRN: [id number] DOB: [birth year] Sex: male  REASON FOR CONSULT: Evaluate for vena cava filter with pulmonary embolus  HPI: Samuel BusingJohn D Wollin is a 66 y.o. male, who is seen for discussion of potential vena cava filter placement. He is a very pleasant 66 year old gentleman who presented to the hospital with 1 week of increasing shortness of breath. He did have a history of prior pulmonary embolus and underwent CT angiogram which showed submassive pulmonary embolus with right heart strain. He had a similar episode of pulmonary embolus in 2012. At that time a vena cava filter was placed but then was subsequently removed. He does not call the exact timing of this the things that it was fairly shortly after discharge. CT scan of his abdomen for other causes in 2014 showed no evidence of vena caval filter. He has been on Coumadin since that time although he is subtherapeutic currently. He does have a history of prior left below-knee amputation. Had by his history of rapidly progressive gangrenous changes requiring below-knee amputation. He does have diabetes and has severe gastroparesis.  Past Medical History:  Diagnosis Date  . Anxiety   . Arthritis    "all over"   . CAD (coronary artery disease)   . Charcot's joint    "left foot"  . 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 Depression   . Gastroparesis   . GERD (gastroesophageal reflux disease)   . H/O hiatal hernia   . Hyperlipidemia   . Hypertension   . OSA (obstructive sleep apnea)    "not bad enough for a mask"  . Peripheral neuropathy   . Peripheral vascular disease (HCC)   . PONV (postoperative nausea and vomiting)   . Pulmonary embolism (HCC)    hx. of 2012  . Shortness of breath    exertion  . Type II diabetes mellitus (HCC)     Family History  Problem Relation Age of Onset  . COPD Mother   . Heart  disease Mother   . Lung cancer Mother   . Retinal detachment Father   . Alcoholism Brother   . Heart disease Brother   . COPD Brother   . Diabetes Brother   . Hypertension Brother   . Stroke Brother     SOCIAL HISTORY: Social History   Social History  . Marital status: Single    Spouse name: N/A  . Number of children: N/A  . Years of education: N/A   Occupational History  . DISABLITY Unemployed   Social History Main Topics  . Smoking status: Never Smoker  . Smokeless tobacco: Never Used  . Alcohol use Yes     Comment: 01/07/2014 'might have a wine cooler a couple times/yr"  . Drug use: No  . Sexual activity: No   Other Topics Concern  . Not on file   Social History Narrative  . No narrative on file    Allergies  Allergen Reactions  . Codeine Other (See Comments)    REACTION: GI Upset  . Gabapentin Hives, Itching and Rash    REACTION: rash/hives (per patient, was a reaction to an inactive ingredient in another mgf brand). The patient stated that he does take this medication now and it doesn't cause a rash any more.  . Nabumetone Itching, Rash and Other (See Comments)    REACTION: sick on stomach and rash    Current Facility-Administered Medications  Medication  Dose Route Frequency Provider Last Rate Last Dose  . ferrous sulfate tablet 325 mg  325 mg Oral QPM Emokpae, Ejiroghene E, MD   325 mg at 11/19/16 1722  . gabapentin (NEURONTIN) capsule 300 mg  300 mg Oral TID Emokpae, Ejiroghene E, MD   300 mg at 11/19/16 1556  . heparin ADULT infusion 100 units/mL (25000 units/253mL sodium chloride 0.45%)  1,450 Units/hr Intravenous Continuous Pham, Anh P, RPH 14.5 mL/hr at 11/19/16 1547 1,450 Units/hr at 11/19/16 1547  . insulin aspart (novoLOG) injection 0-9 Units  0-9 Units Subcutaneous TID WC Emokpae, Ejiroghene E, MD   2 Units at 11/19/16 1712  . insulin detemir (LEVEMIR) injection 40 Units  40 Units Subcutaneous BID Emokpae, Ejiroghene E, MD   40 Units at 11/19/16  1023  . ondansetron (ZOFRAN) tablet 4 mg  4 mg Oral Q6H PRN Emokpae, Ejiroghene E, MD       Or  . ondansetron (ZOFRAN) injection 4 mg  4 mg Intravenous Q6H PRN Emokpae, Ejiroghene E, MD      . pantoprazole (PROTONIX) EC tablet 40 mg  40 mg Oral BID PRN Emokpae, Ejiroghene E, MD      . simvastatin (ZOCOR) tablet 60 mg  60 mg Oral q1800 Emokpae, Ejiroghene E, MD   60 mg at 11/19/16 1722  . sodium chloride flush (NS) 0.9 % injection 3 mL  3 mL Intravenous Q12H Emokpae, Ejiroghene E, MD   3 mL at 11/19/16 0124  . traMADol (ULTRAM) tablet 100 mg  100 mg Oral Q12H PRN Randel Pigg, Dorma Russell, MD   100 mg at 11/19/16 1555    REVIEW OF SYSTEMS:  Reviewed in his history and physical with nothing to add aside from past history   PHYSICAL EXAM: Vitals:   11/19/16 1400 11/19/16 1500 11/19/16 1600 11/19/16 1700  BP: (!) 116/36  (!) 130/52   Pulse: 81 80 78 78  Resp: 20 14 20 19   Temp:   97.9 F (36.6 C)   TempSrc:   Oral   SpO2: 98% 98% 99% 99%  Weight:      Height:        GENERAL: The patient is a well-nourished male, in no acute distress. The vital signs are documented above. CARDIOVASCULAR: Palpable radial pulses. 2+ dorsalis pedis and posterior tibial pulse in his right leg. PULMONARY: There is good air exchange . Some shortness of breath ABDOMEN: Soft and non-tender  MUSCULOSKELETAL: Left below-knee amputation NEUROLOGIC: No focal weakness or paresthesias are detected. SKIN: There are no ulcers or rashes noted. PSYCHIATRIC: The patient has a normal affect.  DATA:  CT scan shows submassive pulmonary embolus with right heart strain  MEDICAL ISSUES: Had long discussion with patient regarding indication for filter. He has had now a second very large pulmonary embolus. I would recommend vena caval filter placement. He was somewhat subtherapeutic on Coumadin. I would recommend continued lifelong Coumadin therapy. I would recommend the not removing the filter with his history of recurrent large  pulmonary embolus. He understands. He will be transferred to [facility] hospital tonight and will have the filter placement in the operating room tomorrow with Dr. Darrick Penna   Larina Earthly, MD [facility] of [facility] Tel [phone] Pager [phone]

## 2016-11-19 NOTE — Progress Notes (Signed)
  Echocardiogram 2D Echocardiogram has been performed.  Janalyn HarderWest, Ladell Lea R 11/19/2016, 11:33 AM

## 2016-11-19 NOTE — Progress Notes (Signed)
VASCULAR LAB PRELIMINARY  PRELIMINARY  PRELIMINARY  PRELIMINARY  Bilateral lower extremity venous duplex completed.    Preliminary report:  Bilateral:  No evidence of DVT, superficial thrombosis, or Baker's Cyst.   Samuel [name], RVS 11/19/2016, 9:58 AM

## 2016-11-19 NOTE — Progress Notes (Addendum)
PROGRESS NOTE [facility]   Samuel [name]   GMW:[id number] DOB: Sep 21, 1950  DOA: 11/18/2016 PCP: [name], MD   Brief Narrative:  Samuel [name] is a 66 year old male with medical history significant for pulmonary embolism in 2012, diabetes mellitus type 2 with gastroparesis, hypertension, left BKA, CAD and OSA presented with one week history of progressive shortness of breath with exertion. Upon evaluation on the ED patient was found to be mildly hypoxic with saturations of 89% CT Angie was done which showed submassive pulmonary embolism with a CT evidence of right heart strain. Patient was admitted to step down and placed on IV heparin. Vascular surgery was consulted given history of having a IVC filter in the past, recommending to place a permanent IVC filter and continue warfarin indefinitely. Patient will be transferred to [facility] for procedures tomorrow in the morning.  Subjective: Patient seen and examined, doing well only complaining of arthritic pain. Breathing has improved. Denies pain in lower extremities   Assessment & Plan: PE - recurrent, patient remained in oxygen supplement 2 L saturations above 92% History of prior DVT and PE in 2012 treated with warfarin for 6 months Had IVC filter in 2012 which was removed CTA shows acute PE with evidence of right heart strain Continue IV heparin Vascular surgery consulted recommending permanent IVC filter and transferred to [facility] Patient will be warfarin indefinitely Transferred to [facility] stepdown unit Echo normal Check pulse ox with ambulation prior to discharge Follow-up hypercoagulable workup  Gastroparesis - chronic nausea and vomiting, stable at this time denies nausea and vomiting tolerating diet well Continue Phenergan  AKI - likely dehydration Last Cr in record is from 2016 1.00 Treated with IVF  Check CMP in AM  Encourage oral hydration   HTN - stable  BP medications on hold Monitor BP  DM type 2  - CABG stable Continue SSI Monitor CBGs Holding Jardiance, Actos and Invokana  CAD - Asymptomatic, stable   Continue Zocor   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  Consultants:   Vascular surgery  Procedures:   None  Antimicrobials: Anti-infectives    None       Objective: Vitals:   11/19/16 0311 11/19/16 0400 11/19/16 0600 11/19/16 0800  BP:  (!) 111/56 (!) 106/49   Pulse:  94 90   Resp:  19 18   Temp: 98.1 F (36.7 C)   97.5 F (36.4 C)  TempSrc: Axillary   Oral  SpO2:  98% 98%   Weight:      Height:        Intake/Output Summary (Last 24 hours) at 11/19/16 0859 Last data filed at 11/19/16 0622  Gross per 24 hour  Intake           1604.8 ml  Output              325 ml  Net           1279.8 ml   Filed Weights   11/18/16 2142 11/19/16 0055  Weight: (!) 141.5 kg (312 lb) 129.8 kg (286 lb 2.5 oz)    Examination:  General exam: Appears calm and  comfortable  HEENT: AC/AT, PERRLA, OP moist and clear, O2 [place]  Respiratory system: Clear to auscultation. No wheezes,crackle or rhonchi Cardiovascular system: S1 & S2 heard, RRR. No JVD, murmurs, rubs or gallops Gastrointestinal system: Abdomen is nondistended, soft and nontender. No organomegaly or masses felt. Normal bowel sounds heard. Central nervous system: Alert and oriented.  Extremities: No edema, Left BKA, normal stump, pulses 1+ Skin: No rashes Psychiatry: Judgement and insight appear normal. Mood & affect appropriate.    Data Reviewed: I have personally reviewed following labs and imaging studies  CBC:  Recent Labs Lab 11/18/16 1853  11/19/16 0513  WBC 9.2 9.6  NEUTROABS 7.7  --   HGB 15.2 14.0  HCT 44.2 42.0  MCV 91.3 92.3  PLT 227 219   Basic Metabolic Panel:  Recent Labs Lab 11/18/16 1853 11/19/16 0513  NA 138 140  K 4.6 4.2  CL 101 107  CO2 16* 19*  GLUCOSE 253* 201*  BUN 22* 23*  CREATININE 1.36* 1.38*  CALCIUM 9.7 9.2   GFR: Estimated Creatinine Clearance: 79.5 mL/min (A) (by C-G formula based on SCr of 1.38 mg/dL (H)). Liver Function Tests:  Recent Labs Lab 11/18/16 1853  AST 19  ALT 21  ALKPHOS 52  BILITOT 1.4*  PROT 8.0  ALBUMIN 3.9    Recent Labs Lab 11/18/16 1853  LIPASE 17   No results for input(s): AMMONIA in the last 168 hours. Coagulation Profile:  Recent Labs Lab 11/18/16 1853  INR 1.04   Cardiac Enzymes: No results for input(s): CKTOTAL, CKMB, CKMBINDEX, TROPONINI in the last 168 hours. BNP (last 3 results) No results for input(s): PROBNP in the last 8760 hours. HbA1C: No results for input(s): HGBA1C in the last 72 hours. CBG:  Recent Labs Lab 11/19/16 0752  GLUCAP 194*   Lipid Profile: No results for input(s): CHOL, HDL, LDLCALC, TRIG, CHOLHDL, LDLDIRECT in the last 72 hours. Thyroid Function Tests: No results for input(s): TSH, T4TOTAL, FREET4, T3FREE, THYROIDAB in the last 72 hours. Anemia Panel: No results for input(s): VITAMINB12, FOLATE, FERRITIN, TIBC, IRON, RETICCTPCT in the last 72 hours. Sepsis Labs: No results for input(s): PROCALCITON, LATICACIDVEN in the last 168 hours.  Recent Results (from the past 240 hour(s))  MRSA PCR Screening     Status: None   Collection Time: 11/19/16 12:52 AM  Result Value Ref Range Status   MRSA by PCR NEGATIVE NEGATIVE Final    Comment:        The GeneXpert MRSA Assay (FDA approved for NASAL specimens only), is one component of a comprehensive MRSA colonization surveillance program. It is not intended to diagnose MRSA infection nor to guide or monitor treatment for MRSA infections.           Radiology Studies: Dg Chest 2 View  Result Date: 11/18/2016 CLINICAL DATA:  Subacute onset of nausea and vomiting. Shortness of breath. Initial encounter. EXAM: CHEST  2 VIEW COMPARISON:  Chest radiograph performed 03/07/2015 FINDINGS: The lungs are well-aerated. Left basilar airspace opacity raises concern for mild pneumonia. There is no evidence of pleural effusion or pneumothorax. The heart is borderline normal in size. No acute osseous abnormalities are seen. IMPRESSION: Left basilar airspace opacity raises concern for mild pneumonia. Electronically Signed   By: Roanna Raider M.D.   On: 11/18/2016 19:38   Ct Angio Chest Pe W/cm &/or Wo Cm  Result Date: 11/18/2016 CLINICAL DATA:  Dyspnea x1 week EXAM: CT ANGIOGRAPHY CHEST WITH CONTRAST TECHNIQUE: Multidetector CT imaging of the chest was  performed using the standard protocol during bolus administration of intravenous contrast. Multiplanar CT image reconstructions and MIPs were obtained to evaluate the vascular anatomy. CONTRAST:  100 cc Isovue 370 IV COMPARISON:  Chest CT 03/13/2011 FINDINGS: Cardiovascular: Tiny segmental and subsegmental pulmonary emboli to the lower lobes with RV/LV ratio equaling 1.19 consistent with right heart strain. No pericardial effusion. No aortic aneurysm or dissection. Three-vessel coronary arteriosclerosis noted Mediastinum/Nodes: No enlarged mediastinal, hilar, or axillary lymph nodes. Thyroid gland, trachea, and esophagus demonstrate no significant findings. Lungs/Pleura: Subsegmental atelectasis bilaterally. No pneumonic consolidation, effusion or pneumothorax. No dominant mass. Upper Abdomen: No acute abnormality. Musculoskeletal: No chest wall abnormality. No acute or significant osseous findings. Review of the MIP images confirms the above findings. IMPRESSION: 1. Positive for acute PE with CT evidence of right heart strain (RV/LV Ratio = 1.19) consistent with at least submassive (intermediate risk) PE. The  presence of right heart strain has been associated with an increased risk of morbidity and mortality. Please activate Code PE by paging [phone]. Critical Value/emergent results were called by telephone at the time of interpretation on 11/18/2016 at 8:57 pm to PA [facility] K[facility] , who verbally acknowledged these results. 2. Bibasilar subsegmental atelectasis.  No acute pulmonary disease. 3. Coronary arteriosclerosis. Electronically Signed   By: Tollie Ethavid  Kwon M.D.   On: 11/18/2016 20:58      Scheduled Meds: . ferrous sulfate  325 mg Oral QPM  . gabapentin  300 mg Oral TID  . insulin aspart  0-9 Units Subcutaneous TID WC  . insulin detemir  40 Units Subcutaneous BID  . simvastatin  60 mg Oral q1800  . sodium chloride flush  3 mL Intravenous Q12H   Continuous Infusions: . sodium chloride 100 mL/hr at 11/19/16 0123  . heparin 1,700 Units/hr (11/19/16 0655)     LOS: 1 day   Time spent:     Latrelle DodrillEdwin Silva, MD Pager: Text Page via [email or web address]  [phone]  If 7PM-7AM, please contact night-coverage [email or web address] Password [email or web address] 11/19/2016, 8:59 AM

## 2016-11-20 ENCOUNTER — Encounter (HOSPITAL_COMMUNITY)
Admission: EM | Disposition: A | Discharge status: Home Health | Payer: Self-pay | Source: Home / Self Care | Attending: Family Medicine

## 2016-11-20 ENCOUNTER — Inpatient Hospital Stay (HOSPITAL_COMMUNITY): Payer: PPO

## 2016-11-20 ENCOUNTER — Inpatient Hospital Stay (HOSPITAL_COMMUNITY): Payer: PPO | Admitting: Anesthesiology

## 2016-11-20 ENCOUNTER — Encounter (HOSPITAL_COMMUNITY): Payer: Self-pay | Admitting: Anesthesiology

## 2016-11-20 DIAGNOSIS — I2510 Atherosclerotic heart disease of native coronary artery without angina pectoris: Secondary | ICD-10-CM

## 2016-11-20 DIAGNOSIS — R112 Nausea with vomiting, unspecified: Secondary | ICD-10-CM

## 2016-11-20 HISTORY — PX: VENA CAVA FILTER PLACEMENT: SHX1085

## 2016-11-20 LAB — GLUCOSE, CAPILLARY
GLUCOSE-CAPILLARY: 140 mg/dL — AB (ref 65–99)
GLUCOSE-CAPILLARY: 146 mg/dL — AB (ref 65–99)
Glucose-Capillary: 115 mg/dL — ABNORMAL HIGH (ref 65–99)
Glucose-Capillary: 144 mg/dL — ABNORMAL HIGH (ref 65–99)
Glucose-Capillary: 153 mg/dL — ABNORMAL HIGH (ref 65–99)

## 2016-11-20 LAB — HEPARIN LEVEL (UNFRACTIONATED)
Heparin Unfractionated: 0.62 IU/mL (ref 0.30–0.70)
Heparin Unfractionated: 0.42 IU/mL (ref 0.30–0.70)

## 2016-11-20 LAB — CBC
HEMATOCRIT: 40.3 % (ref 39.0–52.0)
Hemoglobin: 13.2 g/dL (ref 13.0–17.0)
MCH: 30.1 pg (ref 26.0–34.0)
MCHC: 32.8 g/dL (ref 30.0–36.0)
MCV: 92 fL (ref 78.0–100.0)
PLATELETS: 199 10*3/uL (ref 150–400)
RBC: 4.38 MIL/uL (ref 4.22–5.81)
RDW: 13 % (ref 11.5–15.5)
WBC: 7.3 10*3/uL (ref 4.0–10.5)

## 2016-11-20 LAB — HOMOCYSTEINE: Homocysteine: 8.3 umol/L (ref 0.0–15.0)

## 2016-11-20 LAB — PROTEIN S, TOTAL: Protein S Ag, Total: 96 % (ref 60–150)

## 2016-11-20 LAB — PROTEIN C ACTIVITY: PROTEIN C ACTIVITY: 157 % (ref 73–180)

## 2016-11-20 LAB — BETA-2-GLYCOPROTEIN I ABS, IGG/M/A
Beta-2 Glyco I IgG: <9 GPI IgG units (ref 0–20)
Beta-2-Glycoprotein I IgA: <9 GPI IgA units (ref 0–25)
Beta-2-Glycoprotein I IgM: <9 GPI IgM units (ref 0–32)

## 2016-11-20 LAB — PROTEIN S ACTIVITY: Protein S Activity: 132 % (ref 63–140)

## 2016-11-20 LAB — CARDIOLIPIN ANTIBODIES, IGG, IGM, IGA: Anticardiolipin IgG: 9 GPL U/mL (ref 0–14)

## 2016-11-20 LAB — PROTEIN C, TOTAL: Protein C, Total: 121 % (ref 60–150)

## 2016-11-20 SURGERY — INSERTION, FILTER, VENA CAVA
Anesthesia: Monitor Anesthesia Care

## 2016-11-20 MED ORDER — 0.9 % SODIUM CHLORIDE (POUR BTL) OPTIME
TOPICAL | Status: DC | PRN
  Administered 2016-11-20: 1000 mL

## 2016-11-20 MED ORDER — PROMETHAZINE HCL 25 MG/ML IJ SOLN
25.0000 mg | Freq: Once | INTRAMUSCULAR | Status: AC
  Administered 2016-11-20: 25 mg via INTRAVENOUS
  Filled 2016-11-20: qty 1

## 2016-11-20 MED ORDER — FENTANYL CITRATE (PF) 100 MCG/2ML IJ SOLN
INTRAMUSCULAR | Status: DC | PRN
  Administered 2016-11-20 (×10): 25 ug via INTRAVENOUS

## 2016-11-20 MED ORDER — IODIXANOL 320 MG/ML IV SOLN
INTRAVENOUS | Status: DC | PRN
  Administered 2016-11-20: 50 mL via INTRAVENOUS

## 2016-11-20 MED ORDER — MEPERIDINE HCL 25 MG/ML IJ SOLN: 6.2500 mg | INTRAMUSCULAR | Status: DC | PRN

## 2016-11-20 MED ORDER — LACTATED RINGERS IV SOLN
INTRAVENOUS | Status: DC
  Administered 2016-11-20: 12:00:00 via INTRAVENOUS

## 2016-11-20 MED ORDER — PROMETHAZINE HCL 25 MG/ML IJ SOLN
INTRAMUSCULAR | Status: AC
  Filled 2016-11-20: qty 1

## 2016-11-20 MED ORDER — PROPOFOL 10 MG/ML IV BOLUS
INTRAVENOUS | Status: DC | PRN
  Administered 2016-11-20 (×2): 10 mg via INTRAVENOUS

## 2016-11-20 MED ORDER — OXYCODONE-ACETAMINOPHEN 5-325 MG PO TABS
1.0000 | ORAL_TABLET | Freq: Once | ORAL | Status: AC
  Administered 2016-11-20: 1 via ORAL
  Filled 2016-11-20: qty 1

## 2016-11-20 MED ORDER — METOPROLOL TARTRATE 5 MG/5ML IV SOLN: 5.0000 mg | INTRAVENOUS | Status: DC | PRN

## 2016-11-20 MED ORDER — MECLIZINE HCL 25 MG PO TABS
25.0000 mg | ORAL_TABLET | Freq: Once | ORAL | Status: AC
  Administered 2016-11-20: 25 mg via ORAL
  Filled 2016-11-20: qty 1

## 2016-11-20 MED ORDER — ATORVASTATIN CALCIUM 20 MG PO TABS
30.0000 mg | ORAL_TABLET | Freq: Every day | ORAL | Status: DC
  Administered 2016-11-20: 22:00:00 30 mg via ORAL
  Filled 2016-11-20: qty 1

## 2016-11-20 MED ORDER — PROMETHAZINE HCL 25 MG/ML IJ SOLN
6.2500 mg | INTRAMUSCULAR | Status: DC | PRN
  Administered 2016-11-20: 12.5 mg via INTRAVENOUS

## 2016-11-20 MED ORDER — MIDAZOLAM HCL 5 MG/5ML IJ SOLN
INTRAMUSCULAR | Status: DC | PRN
  Administered 2016-11-20 (×2): 1 mg via INTRAVENOUS

## 2016-11-20 MED ORDER — AMLODIPINE BESYLATE 10 MG PO TABS
10.0000 mg | ORAL_TABLET | Freq: Every day | ORAL | Status: DC
  Administered 2016-11-21: 10 mg via ORAL
  Filled 2016-11-20 (×2): qty 1

## 2016-11-20 MED ORDER — LIDOCAINE HCL (PF) 1 % IJ SOLN
INTRAMUSCULAR | Status: DC | PRN
  Administered 2016-11-20: 20 mL
  Administered 2016-11-20: 15 mL

## 2016-11-20 MED ORDER — METOCLOPRAMIDE HCL 5 MG/ML IJ SOLN
5.0000 mg | Freq: Once | INTRAMUSCULAR | Status: AC
  Administered 2016-11-20: 5 mg via INTRAVENOUS
  Filled 2016-11-20: qty 2

## 2016-11-20 MED ORDER — FENTANYL CITRATE (PF) 250 MCG/5ML IJ SOLN
INTRAMUSCULAR | Status: AC
  Filled 2016-11-20: qty 5

## 2016-11-20 MED ORDER — MIDAZOLAM HCL 2 MG/2ML IJ SOLN
INTRAMUSCULAR | Status: AC
  Filled 2016-11-20: qty 2

## 2016-11-20 MED ORDER — FENTANYL CITRATE (PF) 100 MCG/2ML IJ SOLN: 25.0000 ug | INTRAMUSCULAR | Status: DC | PRN

## 2016-11-20 MED ORDER — DIPHENHYDRAMINE HCL 50 MG/ML IJ SOLN
12.5000 mg | Freq: Once | INTRAMUSCULAR | Status: AC
  Administered 2016-11-20: 12.5 mg via INTRAVENOUS
  Filled 2016-11-20: qty 1

## 2016-11-20 MED ORDER — SODIUM CHLORIDE 0.9 % IV SOLN
INTRAVENOUS | Status: DC | PRN
  Administered 2016-11-20: 10:00:00

## 2016-11-20 MED ORDER — PROPOFOL 500 MG/50ML IV EMUL
INTRAVENOUS | Status: DC | PRN
  Administered 2016-11-20: 50 ug/kg/min via INTRAVENOUS

## 2016-11-20 MED ORDER — LIDOCAINE HCL 1 % IJ SOLN
INTRAMUSCULAR | Status: AC
  Filled 2016-11-20: qty 40

## 2016-11-20 MED ORDER — SODIUM CHLORIDE 0.9 % IV SOLN
INTRAVENOUS | Status: DC | PRN
  Administered 2016-11-20: 09:00:00 via INTRAVENOUS

## 2016-11-20 MED ORDER — SODIUM CHLORIDE 0.9 % IV SOLN
8.0000 mg | Freq: Once | INTRAVENOUS | Status: AC
  Administered 2016-11-20: 8 mg via INTRAVENOUS
  Filled 2016-11-20: qty 4

## 2016-11-20 MED ORDER — PROMETHAZINE HCL 25 MG/ML IJ SOLN
25.0000 mg | Freq: Four times a day (QID) | INTRAMUSCULAR | Status: DC | PRN
  Administered 2016-11-20 – 2016-11-21 (×2): 25 mg via INTRAVENOUS
  Filled 2016-11-20 (×2): qty 1

## 2016-11-20 MED ORDER — HEPARIN (PORCINE) IN NACL 100-0.45 UNIT/ML-% IJ SOLN
1450.0000 [IU]/h | INTRAMUSCULAR | Status: AC
  Administered 2016-11-20: 1450 [IU]/h via INTRAVENOUS
  Filled 2016-11-20: qty 250

## 2016-11-20 MED ORDER — PROPOFOL 10 MG/ML IV BOLUS
INTRAVENOUS | Status: AC
  Filled 2016-11-20: qty 20

## 2016-11-20 MED ORDER — METOCLOPRAMIDE HCL 5 MG/ML IJ SOLN
5.0000 mg | Freq: Every day | INTRAMUSCULAR | Status: DC
  Administered 2016-11-21: 5 mg via INTRAVENOUS
  Filled 2016-11-20: qty 2

## 2016-11-20 MED ORDER — HYDRALAZINE HCL 20 MG/ML IJ SOLN: 10.0000 mg | Freq: Four times a day (QID) | INTRAMUSCULAR | Status: DC | PRN

## 2016-11-20 SURGICAL SUPPLY — 35 items
BAG DECANTER FOR FLEXI CONT (MISCELLANEOUS) ×2 IMPLANT
BLADE SURG 11 STRL SS (BLADE) ×2 IMPLANT
COVER BACK TABLE 60X90IN (DRAPES) ×2 IMPLANT
COVER PROBE W GEL 5X96 (DRAPES) ×4 IMPLANT
DECANTER SPIKE VIAL GLASS SM (MISCELLANEOUS) ×2 IMPLANT
DRAPE C-ARM 42X72 X-RAY (DRAPES) ×2 IMPLANT
DRAPE CHEST BREAST 15X10 FENES (DRAPES) ×2 IMPLANT
DRSG TEGADERM 4X4.75 (GAUZE/BANDAGES/DRESSINGS) ×2 IMPLANT
FILTER VC CELECT-FEMORAL (Filter) ×2 IMPLANT
GAUZE SPONGE 2X2 8PLY STRL LF (GAUZE/BANDAGES/DRESSINGS) ×1 IMPLANT
GAUZE SPONGE 4X4 12PLY STRL (GAUZE/BANDAGES/DRESSINGS) ×2 IMPLANT
GAUZE SPONGE 4X4 16PLY XRAY LF (GAUZE/BANDAGES/DRESSINGS) ×2 IMPLANT
GLOVE BIO SURGEON STRL SZ7.5 (GLOVE) ×2 IMPLANT
GLOVE BIOGEL PI IND STRL 6.5 (GLOVE) ×2 IMPLANT
GLOVE BIOGEL PI INDICATOR 6.5 (GLOVE) ×2
GLOVE ECLIPSE 6.5 STRL STRAW (GLOVE) ×2 IMPLANT
GOWN STRL REUS W/ TWL LRG LVL3 (GOWN DISPOSABLE) ×2 IMPLANT
GOWN STRL REUS W/TWL LRG LVL3 (GOWN DISPOSABLE) ×4
KIT BASIN OR (CUSTOM PROCEDURE TRAY) ×2 IMPLANT
KIT ROOM TURNOVER OR (KITS) ×2 IMPLANT
NEEDLE HYPO 25GX1X1/2 BEV (NEEDLE) ×2 IMPLANT
NEEDLE PERC 18GX7CM (NEEDLE) ×2 IMPLANT
NS IRRIG 1000ML POUR BTL (IV SOLUTION) ×2 IMPLANT
PACK SURGICAL SETUP 50X90 (CUSTOM PROCEDURE TRAY) ×2 IMPLANT
PAD ARMBOARD 7.5X6 YLW CONV (MISCELLANEOUS) ×4 IMPLANT
SPONGE GAUZE 2X2 STER 10/PKG (GAUZE/BANDAGES/DRESSINGS) ×1
SPONGE LAP 18X18 X RAY DECT (DISPOSABLE) IMPLANT
SUT ETHILON 3 0 PS 1 (SUTURE) IMPLANT
SYR 20ML ECCENTRIC (SYRINGE) IMPLANT
SYR 30ML LL (SYRINGE) IMPLANT
SYR 5ML LL (SYRINGE) ×2 IMPLANT
SYR CONTROL 10ML LL (SYRINGE) ×2 IMPLANT
WATER STERILE IRR 1000ML POUR (IV SOLUTION) ×2 IMPLANT
WIRE BENTSON .035X145CM (WIRE) ×2 IMPLANT
WIRE J 3MM .035X145CM (WIRE) IMPLANT

## 2016-11-20 NOTE — H&P (View-Only) (Signed)
[facility]  Patient name: Samuel BusingJohn D [name] MRN: [id number] DOB: [birth year] Sex: male  REASON FOR CONSULT: Evaluate for vena cava filter with pulmonary embolus  HPI: Samuel [name] is a 66 y.o. male, who is seen for discussion of potential vena cava filter placement. He is a very pleasant 66 year old gentleman who presented to the hospital with 1 week of increasing shortness of breath. He did have a history of prior pulmonary embolus and underwent CT angiogram which showed submassive pulmonary embolus with right heart strain. He had a similar episode of pulmonary embolus in 2012. At that time a vena cava filter was placed but then was subsequently removed. He does not call the exact timing of this the things that it was fairly shortly after discharge. CT scan of his abdomen for other causes in 2014 showed no evidence of vena caval filter. He has been on Coumadin since that time although he is subtherapeutic currently. He does have a history of prior left below-knee amputation. Had by his history of rapidly progressive gangrenous changes requiring below-knee amputation. He does have diabetes and has severe gastroparesis.  Past Medical History:  Diagnosis Date  . Anxiety   . Arthritis    "all over"   . CAD (coronary artery disease)   . Charcot's joint    "left foot"  . 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 Depression   . Gastroparesis   . GERD (gastroesophageal reflux disease)   . H/O hiatal hernia   . Hyperlipidemia   . Hypertension   . OSA (obstructive sleep apnea)    "not bad enough for a mask"  . Peripheral neuropathy   . Peripheral vascular disease (HCC)   . PONV (postoperative nausea and vomiting)   . Pulmonary embolism (HCC)    hx. of 2012  . Shortness of breath    exertion  . Type II diabetes mellitus (HCC)     Family History  Problem Relation Age of Onset  . COPD Mother   . Heart  disease Mother   . Lung cancer Mother   . Retinal detachment Father   . Alcoholism Brother   . Heart disease Brother   . COPD Brother   . Diabetes Brother   . Hypertension Brother   . Stroke Brother     SOCIAL HISTORY: Social History   Social History  . Marital status: Single    Spouse name: N/A  . Number of children: N/A  . Years of education: N/A   Occupational History  . DISABLITY Unemployed   Social History Main Topics  . Smoking status: Never Smoker  . Smokeless tobacco: Never Used  . Alcohol use Yes     Comment: 01/07/2014 'might have a wine cooler a couple times/yr"  . Drug use: No  . Sexual activity: No   Other Topics Concern  . Not on file   Social History Narrative  . No narrative on file    Allergies  Allergen Reactions  . Codeine Other (See Comments)    REACTION: GI Upset  . Gabapentin Hives, Itching and Rash    REACTION: rash/hives (per patient, was a reaction to an inactive ingredient in another mgf brand). The patient stated that he does take this medication now and it doesn't cause a rash any more.  . Nabumetone Itching, Rash and Other (See Comments)    REACTION: sick on stomach and rash    Current Facility-Administered Medications  Medication  Dose Route Frequency Provider Last Rate Last Dose  . ferrous sulfate tablet 325 mg  325 mg Oral QPM Emokpae, Ejiroghene E, MD   325 mg at 11/19/16 1722  . gabapentin (NEURONTIN) capsule 300 mg  300 mg Oral TID Emokpae, Ejiroghene E, MD   300 mg at 11/19/16 1556  . heparin ADULT infusion 100 units/mL (25000 units/253mL sodium chloride 0.45%)  1,450 Units/hr Intravenous Continuous Pham, Anh P, RPH 14.5 mL/hr at 11/19/16 1547 1,450 Units/hr at 11/19/16 1547  . insulin aspart (novoLOG) injection 0-9 Units  0-9 Units Subcutaneous TID WC Emokpae, Ejiroghene E, MD   2 Units at 11/19/16 1712  . insulin detemir (LEVEMIR) injection 40 Units  40 Units Subcutaneous BID Emokpae, Ejiroghene E, MD   40 Units at 11/19/16  1023  . ondansetron (ZOFRAN) tablet 4 mg  4 mg Oral Q6H PRN Emokpae, Ejiroghene E, MD       Or  . ondansetron (ZOFRAN) injection 4 mg  4 mg Intravenous Q6H PRN Emokpae, Ejiroghene E, MD      . pantoprazole (PROTONIX) EC tablet 40 mg  40 mg Oral BID PRN Emokpae, Ejiroghene E, MD      . simvastatin (ZOCOR) tablet 60 mg  60 mg Oral q1800 Emokpae, Ejiroghene E, MD   60 mg at 11/19/16 1722  . sodium chloride flush (NS) 0.9 % injection 3 mL  3 mL Intravenous Q12H Emokpae, Ejiroghene E, MD   3 mL at 11/19/16 0124  . traMADol (ULTRAM) tablet 100 mg  100 mg Oral Q12H PRN Randel Pigg, Dorma Russell, MD   100 mg at 11/19/16 1555    REVIEW OF SYSTEMS:  Reviewed in his history and physical with nothing to add aside from past history   PHYSICAL EXAM: Vitals:   11/19/16 1400 11/19/16 1500 11/19/16 1600 11/19/16 1700  BP: (!) 116/36  (!) 130/52   Pulse: 81 80 78 78  Resp: 20 14 20 19   Temp:   97.9 F (36.6 C)   TempSrc:   Oral   SpO2: 98% 98% 99% 99%  Weight:      Height:        GENERAL: The patient is a well-nourished male, in no acute distress. The vital signs are documented above. CARDIOVASCULAR: Palpable radial pulses. 2+ dorsalis pedis and posterior tibial pulse in his right leg. PULMONARY: There is good air exchange . Some shortness of breath ABDOMEN: Soft and non-tender  MUSCULOSKELETAL: Left below-knee amputation NEUROLOGIC: No focal weakness or paresthesias are detected. SKIN: There are no ulcers or rashes noted. PSYCHIATRIC: The patient has a normal affect.  DATA:  CT scan shows submassive pulmonary embolus with right heart strain  MEDICAL ISSUES: Had long discussion with patient regarding indication for filter. He has had now a second very large pulmonary embolus. I would recommend vena caval filter placement. He was somewhat subtherapeutic on Coumadin. I would recommend continued lifelong Coumadin therapy. I would recommend the not removing the filter with his history of recurrent large  pulmonary embolus. He understands. He will be transferred to [facility] hospital tonight and will have the filter placement in the operating room tomorrow with Dr. Darrick Penna   Larina Earthly, MD [facility] V[facility] and Vein Specialists of [facility] Tel [phone] Pager [phone]

## 2016-11-20 NOTE — Progress Notes (Signed)
ANTICOAGULATION CONSULT NOTE - Follow Up Consult  Pharmacy Consult for Heparin Indication: pulmonary embolus  Patient Measurements: Height: 6\' 3"  (190.5 cm) Weight: 286 lb 2.5 oz (129.8 kg) IBW/kg (Calculated) : 84.5 Heparin Dosing Weight: 113 kg  Vital Signs: Temp: 97.9 F (36.6 C) (06/05 1200) BP: 124/72 (06/05 1200) Pulse Rate: 87 (06/05 1200)  Labs:  Recent Labs  11/18/16 1853  11/19/16 0513 11/19/16 1324 11/19/16 2119 11/20/16 0406  HGB 15.2  --  14.0  --   --  13.2  HCT 44.2  --  42.0  --   --  40.3  PLT 227  --  219  --   --  199  APTT 27  --   --   --   --   --   LABPROT 13.7  --   --   --   --   --   INR 1.04  --   --   --   --   --   HEPARINUNFRC  --   < > 0.94* 0.90* 0.64 0.62  CREATININE 1.36*  --  1.38*  --   --   --   < > = values in this interval not displayed.  Estimated Creatinine Clearance: 79.5 mL/min (A) (by C-G formula based on SCr of 1.38 mg/dL (H)).  Assessment:   66 y.o M with [facility] presented to the ED on 11/18/16 with c/o SOB.  Chest CTA showed new PE with right heart strain  consistent with at submassive PE.  Heparin drip started on admission for PE.  LE doppler on 11/19/16 neg for DVT.    To OR ~9am this morning for IVC filter placement.  Heparin drip off in OR, resumed in PACU at 12noon at 1450 units/hr.  Last heparin level was therapeutic (0.62) this morning on 1450 units/hr.  Goal of Therapy:  Heparin level 0.3-0.7 units/ml Monitor platelets by anticoagulation protocol: Yes   Plan:   Continue heparin drip at 1450 units/hr.  Heparin level ~6pm tonight, ~6hrs after drip resumed post-op.  Daily heparin level and CBC while on heparin.  Noted plan for possible transition to Eliquis or Xarelto on 6/6.  Dennie Fettersgan, Ronan Duecker Donovan, [place] Pager: [phone] 11/20/2016,3:02 PM

## 2016-11-20 NOTE — Plan of Care (Signed)
Problem: Pain Managment: Goal: General experience of comfort will improve Outcome: Progressing C/o of headache, relieved w/ pain medications  Problem: Physical Regulation: Goal: Ability to maintain clinical measurements within normal limits will improve Outcome: Progressing Vitals stable throughout shift. Denies any SOB. C/o of headache 10/10, relieved w/ pain medications.

## 2016-11-20 NOTE — Op Note (Signed)
Procedure: #1 ultrasound right groin #2 inferior venacavogram #3 insertion of Cook select IVC filter  Preoperative diagnosis: Recurrent pulmonary embolus Postoperative diagnosis: Same  Anesthesia: Local with IV sedation  Operative findings: #1 Cook select IVC filter placed with tip of IVC filter adjacent to top of L2 L3 vertebral space below renal veins                                 #2 30 mL of contrast                                 #3 right femoral vein puncture  Operative details: After obtaining informed consent, the patient was taken to the room 10 operating room. The patient was placed in supine position on the table. After adequate sedation, the patient's pannus was taped up to expose the right groin. Sterile prep and drape was performed of the left groin. Ultrasound was used to identify the right common femoral vein. This had normal compressibility and respiratory variation and no evidence of DVT. An introducer needle was then used to cannulate the right common femoral vein without difficulty. An 78035 Bentson wire was then threaded up into the abdominal portion of the inferior vena cava under fluoroscopic guidance. A dilator was placed over the guidewire to dilate the tract. A Cook select sheath and dilator were then placed over the guidewire into the mid inferior vena cava. The dilator was removed and an inferior venacavogram obtained by injecting contrast through the sheath. This showed the inferior vena cava had a 15 mm diameter. The renal veins appeared to come off at the inferior edge of the L1 vertebral body. The select filter was then placed through the sheath and deployed with the tip of the filter adjacent to the L2 vertebral body. The sheath was then removed and hemostasis obtained with direct pressure. The patient tolerated the procedure well and there were no complications. The patient was taken to recovery room in stable condition.  Ok to resume heparin in 1 hour. No  bolus  Fabienne Brunsharles Wallice Granville, MD [facility] [place] Office: [phone] Pager: [phone]

## 2016-11-20 NOTE — Plan of Care (Signed)
Problem: Nutrition: Goal: Adequate nutrition will be maintained Outcome: Progressing Patient able to eat small portion of dinner.  Nausea greatly decreased, pt tolerating po intake.  Pt progressing toward goal.

## 2016-11-20 NOTE — Progress Notes (Signed)
ANTICOAGULATION CONSULT NOTE - Follow Up Consult  Pharmacy Consult for Heparin Indication: pulmonary embolus  Patient Measurements: Height: 6\' 3"  (190.5 cm) Weight: 286 lb 2.5 oz (129.8 kg) IBW/kg (Calculated) : 84.5 Heparin Dosing Weight: 113 kg  Vital Signs: Temp: 97.6 F (36.4 C) (06/05 1526) Temp Source: Oral (06/05 1526) BP: 127/52 (06/05 1526) Pulse Rate: 88 (06/05 1526)  Labs:  Recent Labs  11/18/16 1853 11/19/16 0513  11/19/16 2119 11/20/16 0406 11/20/16 1915  HGB 15.2 14.0  --   --  13.2  --   HCT 44.2 42.0  --   --  40.3  --   PLT 227 219  --   --  199  --   APTT 27  --   --   --   --   --   LABPROT 13.7  --   --   --   --   --   INR 1.04  --   --   --   --   --   HEPARINUNFRC  --  0.94*  < > 0.64 0.62 0.42  CREATININE 1.36* 1.38*  --   --   --   --   < > = values in this interval not displayed.  Estimated Creatinine Clearance: 79.5 mL/min (A) (by C-G formula based on SCr of 1.38 mg/dL (H)).  Assessment: 66 y.o M with h[facility] PE[facility]DVT [facility]) presented to the ED on 11/18/16 with c/o SOB. Chest CTA showed new PE with right heart strain  consistent with at submassive PE. Heparin drip started on admission for PE.  LE doppler on 11/19/16 neg for DVT.  Repeat heparin level remains therapeutic at 0.42 on heparin 1450 units/hr. No issues with infusion or bleeding noted.   Goal of Therapy:  Heparin level 0.3-0.7 units/ml Monitor platelets by anticoagulation protocol: Yes   Plan:  Continue heparin 1450 units/hr Daily HL/CBC Noted plan for possible transition to Eliquis or Xarelto on 6/6.   Arlean Hoppingorey M. Newman PiesBall, PharmD, BCPS Clinical Pharmacist [phone] 11/20/2016,7:56 PM

## 2016-11-20 NOTE — Transfer of Care (Signed)
Immediate Anesthesia Transfer of Care Note  Patient: [name]  Procedure(s) Performed: Procedure(s): INSERTION VENA-CAVA FILTER (N/A)  Patient Location: PACU  Anesthesia Type:MAC  Level of Consciousness: awake, alert , oriented and sedated  Airway & Oxygen Therapy: Patient Spontanous Breathing and Patient connected to nasal cannula oxygen  Post-op Assessment: Report given to RN, Post -op Vital signs reviewed and stable and Patient moving all extremities  Post vital signs: Reviewed and stable  Last Vitals:  Vitals:   11/19/16 2033 11/20/16 0320  BP: 135/72 (!) 145/76  Pulse: 80 79  Resp: (!) 22 (!) 21  Temp: 36.4 C     Last Pain:  Vitals:   11/20/16 0441  TempSrc:   PainSc: 4          Complications: No apparent anesthesia complications

## 2016-11-20 NOTE — Progress Notes (Signed)
Pt back from IVC filter placement, having N/V with no relief from zofran or phangergan, MD notified, one time order for additional dose of zofran and reglan received and administered, resting in bed at present, states any movement caused severe nausea, will continue to monitor closely.  Raymon MuttonGwen Hagen Tidd RN

## 2016-11-20 NOTE — Anesthesia Preprocedure Evaluation (Addendum)
Anesthesia Evaluation  Patient identified by MRN, date of birth, ID band Patient awake    Reviewed: Allergy & Precautions, NPO status , Patient's Chart, lab work & pertinent test results  History of Anesthesia Complications (+) PONV and history of anesthetic complications  Airway Mallampati: II       Dental  (+) Poor Dentition, Missing   Pulmonary sleep apnea ,     + decreased breath sounds      Cardiovascular hypertension, Pt. on medications + CAD and + Peripheral Vascular Disease  Normal cardiovascular exam     Neuro/Psych PSYCHIATRIC DISORDERS Anxiety Depression  Neuromuscular disease    GI/Hepatic Neg liver ROS, hiatal hernia, GERD  Medicated,  Endo/Other  diabetes, Type 2, Insulin Dependent  Renal/GU negative Renal ROS  negative genitourinary   Musculoskeletal  (+) Arthritis , Osteoarthritis,    Abdominal   Peds negative pediatric ROS (+)  Hematology negative hematology ROS (+)   Anesthesia Other Findings -HLD  Reproductive/Obstetrics negative OB ROS                            Lab Results  Component Value Date   WBC 7.3 11/20/2016   HGB 13.2 11/20/2016   HCT 40.3 11/20/2016   MCV 92.0 11/20/2016   PLT 199 11/20/2016   Lab Results  Component Value Date   CREATININE 1.38 (H) 11/19/2016   BUN 23 (H) 11/19/2016   NA 140 11/19/2016   K 4.2 11/19/2016   CL 107 11/19/2016   CO2 19 (L) 11/19/2016   Lab Results  Component Value Date   INR 1.04 11/18/2016   INR 1.14 03/07/2015   INR 2.23 (H) 09/21/2011   Echo: - Left ventricle: The cavity size was normal. There was moderate   concentric hypertrophy. Systolic function was normal. The   estimated ejection fraction was in the range of 60% to 65%. Wall   motion was normal; there were no regional wall motion   abnormalities. Doppler parameters are consistent with abnormal   left ventricular relaxation (grade 1 diastolic  dysfunction).   There was no evidence of elevated ventricular filling pressure by   Doppler parameters. - Aortic valve: There was no regurgitation. - Mitral valve: There was no regurgitation. - Left atrium: The atrium was normal in size. - Right ventricle: The cavity size was mildly dilated. Wall   thickness was normal. Systolic function was normal. - Tricuspid valve: There was trivial regurgitation. - Pulmonary arteries: Systolic pressure was within the normal   range. - Inferior vena cava: The vessel was normal in size. - Pericardium, extracardiac: There was no pericardial effusion.  Anesthesia Physical Anesthesia Plan  ASA: III  Anesthesia Plan: MAC   Post-op Pain Management:    Induction: Intravenous  PONV Risk Score and Plan: 2 and Ondansetron, Treatment may vary due to age, Propofol and Midazolam  Airway Management Planned: Natural Airway  Additional Equipment:   Intra-op Plan:   Post-operative Plan:   Informed Consent: I have reviewed the patients History and Physical, chart, labs and discussed the procedure including the risks, benefits and alternatives for the proposed anesthesia with the patient or authorized representative who has indicated his/her understanding and acceptance.     Plan Discussed with: CRNA  Anesthesia Plan Comments: (Minimal sedation and Local)       Anesthesia Quick Evaluation

## 2016-11-20 NOTE — Progress Notes (Signed)
@IPLOG         PROGRESS NOTE                                                                                                                                                                                                             Patient Demographics:    Samuel [name], is a 66 y.o. male, DOB - [birth year], ZOX:[id number]  Admit date - 11/18/2016   Admitting Physician Ejiroghene Wendall Stade, MD  Outpatient Primary MD for the patient is [name], MD  LOS - 2  Chief Complaint  Patient presents with  . Shortness of Breath  . Emesis  . Nausea       Brief Narrative Samuel [name] is a 66 year old male with medical history significant for pulmonary embolism in 2012, diabetes mellitus type 2 with gastroparesis, hypertension, left BKA, CAD and OSA presented with one week history of progressive shortness of breath with exertion. Upon evaluation on the ED patient was found to be mildly hypoxic with saturations of 89% CT Angie was done which showed submassive pulmonary embolism with a CT evidence of right heart strain, He was admitted to [facility], vascular surgery was consulted who recommended IVC filter placement on a permanent basis, he was subsequently transferred to [facility] on 11/19/2016 for IVC filter placement, he was transferred to my care on 11/20/2016 morning.   Subjective:    Samuel [name] today has, Mild ongoing frontal headache, No chest pain, No abdominal pain - mild nausea Nausea, No new weakness tingling or numbness, No Cough - SOB.    Assessment  & Plan :     1.Submassive PE with CT evidence of right heart strain. Second PE since 2012, was not on anticoagulation prior to this admission, currently on heparin drip, vascular surgery was consulted by previous M.D. and he is being wheeled down for IVC filter placement, he supposed to get a permanent IVC filter. Thereafter plan is to anticoagulate him lifelong, I do not see any contraindication to new her  anticoagulation agents, continue heparin drip today and tomorrow can be transitioned to Eliquis or Xaralto.  2. 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 Creatinine around 1.3, being gently hydrated, repeat BMP in the morning.  3. DM type II with diabetic gastroparesis. On Levemir and sliding scale, monitor CBGs P  Lab Results  Component Value Date   HGBA1C 7.7 (H) 12/21/2013   CBG (last 3)   Recent Labs  11/19/16 2255 11/20/16 0355 11/20/16 0817  GLUCAP 152* 115* 140*  4.Diabetic gastroparesis with intermittent nausea. Supportive care with Phenergan and Zofran, premeal Reglan trial.  5. Dyslipidemia. Continue statin.  6. GERD. On PPI.  7. CAD. Asymptomatic, continue statin for secondary prevention. Will be on anticoagulation for now for #1.  8. 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 Has prosthetic leg, PT eval tomorrow, does use a wheelchair at home. May require placement based on PT eval.  9. Morbid obesity. Follow with PCP for weight loss.  10. HTN - resume Norvasc as needed IV hydralazine added.     Diet : Diet NPO time specified Except for: Sips with Meds    Family Communication  : None  Code Status :  Full  Disposition Plan  :  TBD  Consults  :  Vas surgery  Procedures  :    CTA - PE with R.Heart starin  Venous U legs - -ve  TTE - Left ventricle: The cavity size was normal. There was moderate concentric hypertrophy. Systolic function was normal. The estimated ejection fraction was in the range of 60% to 65%. Wall motion was normal; there were no regional wall motion abnormalities. Doppler parameters are consistent with abnormal left ventricular relaxation (grade 1 diastolic dysfunction). There was no evidence of elevated ventricular filling pressure by Doppler parameters. - Aortic valve: There was no regurgitation. - Mitral valve: There was no regurgitation. - Left atrium: The atrium was normal in size. - Right ventricle: The cavity size was mildly dilated.  Wall thickness was normal. Systolic function was normal. - Tricuspid valve: There was trivial regurgitation. - Pulmonary arteries: Systolic pressure was within the normal  range. - Inferior vena cava: The vessel was normal in size. - Pericardium, extracardiac: There was no pericardial effusion.  DVT Prophylaxis  :   Heparin   Lab Results  Component Value Date   PLT 199 11/20/2016    Inpatient Medications  Scheduled Meds: . ferrous sulfate  325 mg Oral QPM  . gabapentin  300 mg Oral TID  . insulin aspart  0-9 Units Subcutaneous TID WC  . insulin detemir  40 Units Subcutaneous BID  . simvastatin  60 mg Oral q1800  . sodium chloride flush  3 mL Intravenous Q12H   Continuous Infusions: . heparin 1,450 Units/hr (11/19/16 2253)   PRN Meds:.[DISCONTINUED] ondansetron **OR** ondansetron (ZOFRAN) IV, pantoprazole, promethazine, traMADol  Antibiotics  :    Anti-infectives    None         Objective:   Vitals:   11/19/16 1600 11/19/16 1700 11/19/16 2033 11/20/16 0320  BP: (!) 130/52  135/72 (!) 145/76  Pulse: 78 78 80 79  Resp: 20 19 (!) 22 (!) 21  Temp: 97.9 F (36.6 C)  97.5 F (36.4 C)   TempSrc: Oral  Oral   SpO2: 99% 99% 98% 98%  Weight:      Height:        Wt Readings from Last 3 Encounters:  11/19/16 129.8 kg (286 lb 2.5 oz)  03/30/15 129.7 kg (286 lb)  03/15/15 129.8 kg (286 lb 2.5 oz)     Intake/Output Summary (Last 24 hours) at 11/20/16 0844 Last data filed at 11/20/16 0841  Gross per 24 hour  Intake           782.64 ml  Output              300 ml  Net           482.64 ml     Physical Exam  Awake Alert,  Oriented X 3, No new F.N deficits, Normal affect [place].AT,PERRAL Supple Neck,No JVD, No cervical lymphadenopathy appriciated.  Symmetrical Chest wall movement, Good air movement bilaterally, CTAB RRR,No Gallops,Rubs or new Murmurs, No Parasternal Heave +ve B.Sounds, Abd Soft, No tenderness, No organomegaly appriciated, No rebound - guarding or  rigidity. 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 toe amputated, few old healed scabs R leg    Data Review:    CBC  Recent Labs Lab 11/18/16 1853 11/19/16 0513 11/20/16 0406  WBC 9.2 9.6 7.3  HGB 15.2 14.0 13.2  HCT 44.2 42.0 40.3  PLT 227 219 199  MCV 91.3 92.3 92.0  MCH 31.4 30.8 30.1  MCHC 34.4 33.3 32.8  RDW 12.8 13.2 13.0  LYMPHSABS 1.1  --   --   MONOABS 0.4  --   --   EOSABS 0.0  --   --   BASOSABS 0.0  --   --     Chemistries   Recent Labs Lab 11/18/16 1853 11/19/16 0513  NA 138 140  K 4.6 4.2  CL 101 107  CO2 16* 19*  GLUCOSE 253* 201*  BUN 22* 23*  CREATININE 1.36* 1.38*  CALCIUM 9.7 9.2  AST 19  --   ALT 21  --   ALKPHOS 52  --   BILITOT 1.4*  --    ------------------------------------------------------------------------------------------------------------------ No results for input(s): CHOL, HDL, LDLCALC, TRIG, CHOLHDL, LDLDIRECT in the last 72 hours.  Lab Results  Component Value Date   HGBA1C 7.7 (H) 12/21/2013   ------------------------------------------------------------------------------------------------------------------ No results for input(s): TSH, T4TOTAL, T3FREE, THYROIDAB in the last 72 hours.  Invalid input(s): FREET3 ------------------------------------------------------------------------------------------------------------------ No results for input(s): VITAMINB12, FOLATE, FERRITIN, TIBC, IRON, RETICCTPCT in the last 72 hours.  Coagulation profile  Recent Labs Lab 11/18/16 1853  INR 1.04     Recent Labs  11/18/16 1853  DDIMER 0.34    Cardiac Enzymes No results for input(s): CKMB, TROPONINI, MYOGLOBIN in the last 168 hours.  Invalid input(s): CK ------------------------------------------------------------------------------------------------------------------    Component Value Date/Time   BNP 62.1 11/19/2016 0513    Micro Results Recent Results (from the past 240 hour(s))   MRSA PCR Screening     Status: None   Collection Time: 11/19/16 12:52 AM  Result Value Ref Range Status   MRSA by PCR NEGATIVE NEGATIVE Final    Comment:        The GeneXpert MRSA Assay (FDA approved for NASAL specimens only), is one component of a comprehensive MRSA colonization surveillance program. It is not intended to diagnose MRSA infection nor to guide or monitor treatment for MRSA infections.     Radiology Reports Dg Chest 2 View  Result Date: 11/18/2016 CLINICAL DATA:  Subacute onset of nausea and vomiting. Shortness of breath. Initial encounter. EXAM: CHEST  2 VIEW COMPARISON:  Chest radiograph performed 03/07/2015 FINDINGS: The lungs are well-aerated. Left basilar airspace opacity raises concern for mild pneumonia. There is no evidence of pleural effusion or pneumothorax. The heart is borderline normal in size. No acute osseous abnormalities are seen. IMPRESSION: Left basilar airspace opacity raises concern for mild pneumonia. Electronically Signed   By: Roanna Raider M.D.   On: 11/18/2016 19:38   Ct Angio Chest Pe W/cm &/or Wo Cm  Result Date: 11/18/2016 CLINICAL DATA:  Dyspnea x1 week EXAM: CT ANGIOGRAPHY CHEST WITH CONTRAST TECHNIQUE: Multidetector CT imaging of the chest was performed using the standard protocol during bolus administration of intravenous contrast. Multiplanar  CT image reconstructions and MIPs were obtained to evaluate the vascular anatomy. CONTRAST:  100 cc Isovue 370 IV COMPARISON:  Chest CT 03/13/2011 FINDINGS: Cardiovascular: Tiny segmental and subsegmental pulmonary emboli to the lower lobes with RV/LV ratio equaling 1.19 consistent with right heart strain. No pericardial effusion. No aortic aneurysm or dissection. Three-vessel coronary arteriosclerosis noted Mediastinum/Nodes: No enlarged mediastinal, hilar, or axillary lymph nodes. Thyroid gland, trachea, and esophagus demonstrate no significant findings. Lungs/Pleura: Subsegmental atelectasis  bilaterally. No pneumonic consolidation, effusion or pneumothorax. No dominant mass. Upper Abdomen: No acute abnormality. Musculoskeletal: No chest wall abnormality. No acute or significant osseous findings. Review of the MIP images confirms the above findings. IMPRESSION: 1. Positive for acute PE with CT evidence of right heart strain (RV/LV Ratio = 1.19) consistent with at least submassive (intermediate risk) PE. The presence of right heart strain has been associated with an increased risk of morbidity and mortality. Please activate Code PE by paging [phone]. Critical Value/emergent results were called by telephone at the time of interpretation on 11/18/2016 at 8:57 pm to PA [facility] KHATRI , who verbally acknowledged these results. 2. Bibasilar subsegmental atelectasis.  No acute pulmonary disease. 3. Coronary arteriosclerosis. Electronically Signed   By: Tollie Ethavid  Kwon M.D.   On: 11/18/2016 20:58    Time Spent in minutes  30   Susa RaringPrashant Nelma Phagan M.D on 11/20/2016 at 8:44 AM  Between 7am to 7pm - Pager - [phone] ( page via [email or web address], text pages only, please mention full 10 digit call back number). After 7pm go to [email or web address] - password [email or web address]

## 2016-11-20 NOTE — Anesthesia Postprocedure Evaluation (Signed)
Anesthesia Post Note  Patient: Samuel [name]  Procedure(s) Performed: Procedure(s) (LRB): INSERTION VENA-CAVA FILTER (N/A)     Patient location during evaluation: PACU Anesthesia Type: MAC Level of consciousness: awake and alert Pain management: pain level controlled Vital Signs Assessment: post-procedure vital signs reviewed and stable Respiratory status: spontaneous breathing, nonlabored ventilation, respiratory function stable and patient connected to nasal cannula oxygen Cardiovascular status: stable and blood pressure returned to baseline Anesthetic complications: no    Last Vitals:  Vitals:   11/20/16 1145 11/20/16 1200  BP: 139/81 124/72  Pulse: 81 87  Resp: 16 18  Temp:  36.6 C    Last Pain:  Vitals:   11/20/16 1100  TempSrc:   PainSc: 0-No pain                 Effie Berkshire

## 2016-11-20 NOTE — Interval H&P Note (Signed)
History and Physical Interval Note:  11/20/2016 7:31 AM  Samuel [name]  has presented today for surgery, with the diagnosis of Pulmonary embolism  The various methods of treatment have been discussed with the patient and family. After consideration of risks, benefits and other options for treatment, the patient has consented to  Procedure(s): INSERTION VENA-CAVA FILTER (N/A) as a surgical intervention .  The patient's history has been reviewed, patient examined, no change in status, stable for surgery.  I have reviewed the patient's chart and labs.  Questions were answered to the patient's satisfaction.     Fabienne BrunsFields, Shulamit Donofrio

## 2016-11-20 NOTE — Progress Notes (Signed)
Pt continues to have unrelieved nausea with dry heaving noted, MD notified, orders for one time dose benedryl, phenergan and antivert administered, order for STAT abd xray received, will continue to monitor closely.

## 2016-11-20 NOTE — Progress Notes (Signed)
Pt awoke from sleep with 10/10 frontal headache. "worst pain he has ever had", also having nausea and chills. Neuro checks and VS stable. Notified PA on call. 1X dose of PO pain medication ordered. Gave Zofran and Pain medication. Will continue to monitor.

## 2016-11-20 NOTE — Consult Note (Signed)
           [facility] Primary [facility] Navigator  11/20/2016  [name] June 18, 1951 [id number]   Went to see patient at the bedside to identify possible discharge needs but RN reports that patient is in the OR for surgery at the moment (Insertion of Vena Cava filter).  Will attempt to meet with patient at another time when he is available in the room.   For questions, please contact:  Wyatt HasteLorraine Ashni Lonzo, BSN, RN- [facility] Primary Care Navigator  Telephone: [phone] [facility]

## 2016-11-21 ENCOUNTER — Encounter (HOSPITAL_COMMUNITY): Payer: Self-pay | Admitting: Vascular Surgery

## 2016-11-21 DIAGNOSIS — E1100 Type 2 diabetes mellitus with hyperosmolarity without nonketotic hyperglycemic-hyperosmolar coma (NKHHC): Secondary | ICD-10-CM

## 2016-11-21 DIAGNOSIS — I2601 Septic pulmonary embolism with acute cor pulmonale: Secondary | ICD-10-CM

## 2016-11-21 LAB — CBC
HCT: 40.6 % (ref 39.0–52.0)
HEMOGLOBIN: 13.5 g/dL (ref 13.0–17.0)
MCH: 30.6 pg (ref 26.0–34.0)
MCHC: 33.3 g/dL (ref 30.0–36.0)
MCV: 92.1 fL (ref 78.0–100.0)
PLATELETS: 193 10*3/uL (ref 150–400)
RBC: 4.41 MIL/uL (ref 4.22–5.81)
RDW: 12.8 % (ref 11.5–15.5)
WBC: 7.3 10*3/uL (ref 4.0–10.5)

## 2016-11-21 LAB — BASIC METABOLIC PANEL
Anion gap: 15 (ref 5–15)
BUN: 14 mg/dL (ref 6–20)
CHLORIDE: 101 mmol/L (ref 101–111)
CO2: 19 mmol/L — ABNORMAL LOW (ref 22–32)
CREATININE: 1.17 mg/dL (ref 0.61–1.24)
Calcium: 9 mg/dL (ref 8.9–10.3)
GFR calc Af Amer: >60 mL/min (ref >60)
Glucose, Bld: 126 mg/dL — ABNORMAL HIGH (ref 65–99)
POTASSIUM: 4 mmol/L (ref 3.5–5.1)
SODIUM: 135 mmol/L (ref 135–145)

## 2016-11-21 LAB — GLUCOSE, CAPILLARY
Glucose-Capillary: 145 mg/dL — ABNORMAL HIGH (ref 65–99)
Glucose-Capillary: 159 mg/dL — ABNORMAL HIGH (ref 65–99)

## 2016-11-21 LAB — LUPUS ANTICOAGULANT PANEL
DRVVT: 35.5 s (ref 0.0–47.0)
PTT LA: 28.3 s (ref 0.0–51.9)

## 2016-11-21 LAB — HEPARIN LEVEL (UNFRACTIONATED): Heparin Unfractionated: 0.41 IU/mL (ref 0.30–0.70)

## 2016-11-21 MED ORDER — APIXABAN 5 MG PO TABS
10.0000 mg | ORAL_TABLET | Freq: Two times a day (BID) | ORAL | Status: DC
  Administered 2016-11-21: 10 mg via ORAL
  Filled 2016-11-21: qty 2

## 2016-11-21 MED ORDER — APIXABAN 5 MG PO TABS: 10.0000 mg | ORAL_TABLET | Freq: Two times a day (BID) | ORAL | 0 refills | Status: DC

## 2016-11-21 MED ORDER — APIXABAN 5 MG PO TABS: 5.0000 mg | ORAL_TABLET | Freq: Two times a day (BID) | ORAL | 0 refills | Status: DC

## 2016-11-21 MED ORDER — APIXABAN 5 MG PO TABS: 5.0000 mg | ORAL_TABLET | Freq: Two times a day (BID) | ORAL | Status: DC

## 2016-11-21 MED ORDER — AMLODIPINE BESYLATE 10 MG PO TABS: 10.0000 mg | ORAL_TABLET | Freq: Every day | ORAL | 2 refills | Status: DC

## 2016-11-21 MED ORDER — AMLODIPINE BESYLATE 10 MG PO TABS: 10.0000 mg | ORAL_TABLET | Freq: Every day | ORAL | 2 refills | Status: AC

## 2016-11-21 NOTE — Discharge Summary (Addendum)
Physician Discharge Summary  Samuel [name] MRN: [id number] DOB/AGE: 01/17/1951 66 y.o.  PCP: Jani Gravel, MD   Admit date: 11/18/2016 Discharge date: 11/21/2016  Discharge Diagnoses:    Principal Problem:   Pulmonary embolism [facility]) Active Problems:   OBSTRUCTIVE SLEEP APNEA   Coronary artery disease   Essential hypertension   Gastroparesis   Diabetes mellitus type 2, uncontrolled (HCC)   Obesity (BMI 30-39.9)    Follow-up recommendations Follow-up with PCP in 3-5 days , including all  additional recommended appointments as below Follow-up CBC, CMP in 3-5 days Patient will need to be on lifelong anticoagulation      Current Discharge Medication List    START taking these medications   Details  !! apixaban (ELIQUIS) 5 MG TABS tablet Take 2 tablets (10 mg total) by mouth 2 (two) times daily. Qty: 14 tablet, Refills: 0    !! apixaban (ELIQUIS) 5 MG TABS tablet Take 1 tablet (5 mg total) by mouth 2 (two) times daily. Qty: 60 tablet, Refills: 0     !! - Potential duplicate medications found. Please discuss with provider.    CONTINUE these medications which have CHANGED   Details  amLODipine (NORVASC) 10 MG tablet Take 1 tablet (10 mg total) by mouth daily. Qty: 30 tablet, Refills: 2      CONTINUE these medications which have NOT CHANGED   Details  acetaminophen (TYLENOL) 325 MG tablet Take 650 mg by mouth every 6 (six) hours as needed for mild pain.    Canagliflozin (INVOKANA) 100 MG TABS Take 100 mg by mouth See admin instructions. invokana 100 mg daily. Alternating every other week between Jardiance and Invokana    diphenhydramine-acetaminophen (TYLENOL PM) 25-500 MG TABS tablet Take 1 tablet by mouth at bedtime.    docusate sodium (COLACE) 100 MG capsule Take 100 mg by mouth daily as needed (for constipation).     empagliflozin (JARDIANCE) 10 MG TABS tablet Take 10 mg by mouth See admin instructions. Jardiance 10 mg daily.  Alternating every other week between  Jardiance and Invokana    ferrous sulfate 325 (65 FE) MG tablet Take 325 mg by mouth every evening.     gabapentin (NEURONTIN) 300 MG capsule Take 300 mg by mouth 3 (three) times daily.    insulin detemir (LEVEMIR) 100 UNIT/ML injection Inject 85 Units into the skin 2 (two) times daily.     insulin lispro (HUMALOG) 100 UNIT/ML injection Inject 0-10 Units into the skin 3 (three) times daily with meals. Inject SQ per sliding scale 150-250 (5 units), 251-350(8 units), 351-450(10 units), >450=10 units    metoCLOPramide (REGLAN) 10 MG tablet Take 10 mg by mouth at bedtime.     omeprazole (PRILOSEC) 20 MG capsule Take 20 mg by mouth daily.     pantoprazole (PROTONIX) 40 MG tablet Take 40 mg by mouth 2 (two) times daily as needed (for heartburn/indigestion).     pioglitazone (ACTOS) 30 MG tablet Take 30 mg by mouth at bedtime.     polyethylene glycol (MIRALAX / GLYCOLAX) packet Take 17 g by mouth daily. Qty: 14 each, Refills: 0    promethazine (PHENERGAN) 25 MG suppository Place 25 mg rectally every 6 (six) hours as needed for nausea or vomiting.    promethazine (PHENERGAN) 25 MG tablet Take 25 mg by mouth every 8 (eight) hours as needed for nausea or vomiting. Take one tablet by mouth every 8 hours as needed nausea/vomiting    ranitidine (ZANTAC) 150 MG tablet Take 150 mg by  mouth at bedtime.     simvastatin (ZOCOR) 40 MG tablet Take 60 mg by mouth daily at 6 PM. Take one and half tablet by mouth once daily in the evening for cholesterol    traMADol (ULTRAM) 50 MG tablet Take 100 mg by mouth every 12 (twelve) hours as needed (pain).         Discharge Condition: Stable  Discharge Instructions Get Medicines reviewed and adjusted: Please take all your medications with you for your next visit with your Primary MD  Please request your Primary MD to go over all hospital tests and procedure/radiological results at the follow up, please ask your Primary MD to get all Hospital records sent  to his/her office.  If you experience worsening of your admission symptoms, develop shortness of breath, life threatening emergency, suicidal or homicidal thoughts you must seek medical attention immediately by calling 911 or calling your MD immediately if symptoms less severe.  You must read complete instructions/literature along with all the possible adverse reactions/side effects for all the Medicines you take and that have been prescribed to you. Take any new Medicines after you have completely understood and accpet all the possible adverse reactions/side effects.   Do not drive when taking Pain medications.   Do not take more than prescribed Pain, Sleep and Anxiety Medications  Special Instructions: If you have smoked or chewed Tobacco in the last 2 yrs please stop smoking, stop any regular Alcohol and or any Recreational drug use.  Wear Seat belts while driving.  Please note  You were cared for by a hospitalist during your hospital stay. Once you are discharged, your primary care physician will handle any further medical issues. Please note that NO REFILLS for any discharge medications will be authorized once you are discharged, as it is imperative that you return to your primary care physician (or establish a relationship with a primary care physician if you do not have one) for your aftercare needs so that they can reassess your need for medications and monitor your lab values.  Discharge Instructions    Diet - low sodium heart healthy    Complete by:  As directed    Increase activity slowly    Complete by:  As directed        Allergies  Allergen Reactions  . Codeine Other (See Comments)    REACTION: GI Upset  . Gabapentin Hives, Itching and Rash    REACTION: rash/hives (per patient, was a reaction to an inactive ingredient in another mgf brand). The patient stated that he does take this medication now and it doesn't cause a rash any more.  . Nabumetone Itching, Rash and  Other (See Comments)    REACTION: sick on stomach and rash      Disposition: Home with home health   Consults:  Vascular    Significant Diagnostic Studies:  Dg Chest 2 View  Result Date: 11/18/2016 CLINICAL DATA:  Subacute onset of nausea and vomiting. Shortness of breath. Initial encounter. EXAM: CHEST  2 VIEW COMPARISON:  Chest radiograph performed 03/07/2015 FINDINGS: The lungs are well-aerated. Left basilar airspace opacity raises concern for mild pneumonia. There is no evidence of pleural effusion or pneumothorax. The heart is borderline normal in size. No acute osseous abnormalities are seen. IMPRESSION: Left basilar airspace opacity raises concern for mild pneumonia. Electronically Signed   By: Garald Balding M.D.   On: 11/18/2016 19:38   Ct Angio Chest Pe W/cm &/or Wo Cm  Result Date: 11/18/2016 CLINICAL  DATA:  Dyspnea x1 week EXAM: CT ANGIOGRAPHY CHEST WITH CONTRAST TECHNIQUE: Multidetector CT imaging of the chest was performed using the standard protocol during bolus administration of intravenous contrast. Multiplanar CT image reconstructions and MIPs were obtained to evaluate the vascular anatomy. CONTRAST:  100 cc Isovue 370 IV COMPARISON:  Chest CT 03/13/2011 FINDINGS: Cardiovascular: Tiny segmental and subsegmental pulmonary emboli to the lower lobes with RV/LV ratio equaling 1.19 consistent with right heart strain. No pericardial effusion. No aortic aneurysm or dissection. Three-vessel coronary arteriosclerosis noted Mediastinum/Nodes: No enlarged mediastinal, hilar, or axillary lymph nodes. Thyroid gland, trachea, and esophagus demonstrate no significant findings. Lungs/Pleura: Subsegmental atelectasis bilaterally. No pneumonic consolidation, effusion or pneumothorax. No dominant mass. Upper Abdomen: No acute abnormality. Musculoskeletal: No chest wall abnormality. No acute or significant osseous findings. Review of the MIP images confirms the above findings. IMPRESSION: 1.  Positive for acute PE with CT evidence of right heart strain (RV/LV Ratio = 1.19) consistent with at least submassive (intermediate risk) PE. The presence of right heart strain has been associated with an increased risk of morbidity and mortality. Please activate Code PE by paging [phone]. Critical Value/emergent results were called by telephone at the time of interpretation on 11/18/2016 at 8:57 pm to Byron , who verbally acknowledged these results. 2. Bibasilar subsegmental atelectasis.  No acute pulmonary disease. 3. Coronary arteriosclerosis. Electronically Signed   By: Ashley Royalty M.D.   On: 11/18/2016 20:58   Dg Abd 2 Views  Result Date: 11/20/2016 CLINICAL DATA:  Intraoperative inferior vena caval filter placement. EXAM: Operative ABDOMEN - 2 VIEW COMPARISON:  None. FINDINGS: Three spot images from the C-arm fluoroscopic device, AP images of the abdomen are submitted for interpretation postoperatively. The initial 2 images demonstrate contrast injection into the intervening cava with no evidence of thrombus. The completion image shows an IVC filter placed in the infrarenal IVC. IMPRESSION: IVC filter placed into the infrarenal IVC. Electronically Signed   By: Evangeline Dakin M.D.   On: 11/20/2016 11:08   Dg Abd Portable 1v  Result Date: 11/20/2016 CLINICAL DATA:  Nausea EXAM: PORTABLE ABDOMEN - 1 VIEW COMPARISON:  Study obtained earlier in the day. FINDINGS: There is no bowel dilatation or air-fluid level. There is moderate stool in the colon. No free air. Lung bases are clear. There is a filter in the inferior vena cava positioned at the mid lumbar level. There are surgical clips in the right upper quadrant. IMPRESSION: Bowel gas pattern unremarkable. No bowel obstruction or free air. Lung bases clear. Electronically Signed   By: Lowella Grip III M.D.   On: 11/20/2016 16:44   Dg Abd Portable 1v  Result Date: 11/20/2016 CLINICAL DATA:  IVC filter. EXAM: PORTABLE ABDOMEN - 1 VIEW  COMPARISON:  11/20/2016.  Abdominal series 919 2016. FINDINGS: IVC filter noted with upper tip at the level of L2. Radiopacity is noted projected over upper pelvis. This could represent a catheter or syringe. Clinical suggested. Surgical clips right upper quadrant. Contrast in the kidneys and bladder. Air-filled loops of small and large bowel noted most likely adynamic ileus. IMPRESSION: 1.  IVC filter noted with its upper tip at the mid L2 level. 2. Mild adynamic ileus. Electronically Signed   By: Marcello Moores  Register   On: 11/20/2016 11:34   Dg C-arm 1-60 Min  Result Date: 11/20/2016 CLINICAL DATA:  Intraoperative inferior vena caval filter placement. EXAM: Operative ABDOMEN - 2 VIEW COMPARISON:  None. FINDINGS: Three spot images from the C-arm fluoroscopic device, AP  images of the abdomen are submitted for interpretation postoperatively. The initial 2 images demonstrate contrast injection into the intervening cava with no evidence of thrombus. The completion image shows an IVC filter placed in the infrarenal IVC. IMPRESSION: IVC filter placed into the infrarenal IVC. Electronically Signed   By: Evangeline Dakin M.D.   On: 11/20/2016 11:08    echocardiogram  LV EF: 60% -   65%  ------------------------------------------------------------------- Indications:      Pulmonary embolus 415.19.  ------------------------------------------------------------------- History:   PMH:  OSA.  Coronary artery disease.  Risk factors: Hypertension. Diabetes mellitus. Obese. Dyslipidemia.  ------------------------------------------------------------------- Study Conclusions  - Left ventricle: The cavity size was normal. There was moderate   concentric hypertrophy. Systolic function was normal. The   estimated ejection fraction was in the range of 60% to 65%. Wall   motion was normal; there were no regional wall motion   abnormalities. Doppler parameters are consistent with abnormal   left ventricular  relaxation (grade 1 diastolic dysfunction).   There was no evidence of elevated ventricular filling pressure by   Doppler parameters. - Aortic valve: There was no regurgitation. - Mitral valve: There was no regurgitation. - Left atrium: The atrium was normal in size. - Right ventricle: The cavity size was mildly dilated. Wall   thickness was normal. Systolic function was normal. - Tricuspid valve: There was trivial regurgitation. - Pulmonary arteries: Systolic pressure was within the normal   range. - Inferior vena cava: The vessel was normal in size. - Pericardium, extracardiac: There was no pericardial effusion.      Filed Weights   11/18/16 2142 11/19/16 0055 11/21/16 0510  Weight: (!) 141.5 kg (312 lb) 129.8 kg (286 lb 2.5 oz) 129.4 kg (285 lb 4.8 oz)     Microbiology: Recent Results (from the past 240 hour(s))  MRSA PCR Screening     Status: None   Collection Time: 11/19/16 12:52 AM  Result Value Ref Range Status   MRSA by PCR NEGATIVE NEGATIVE Final    Comment:        The GeneXpert MRSA Assay (FDA approved for NASAL specimens only), is one component of a comprehensive MRSA colonization surveillance program. It is not intended to diagnose MRSA infection nor to guide or monitor treatment for MRSA infections.        Blood Culture    Component Value Date/Time   SDES BLOOD LEFT FOREARM 03/07/2015 0448   SPECREQUEST BOTTLES DRAWN AEROBIC AND ANAEROBIC 5CC 03/07/2015 0448   CULT  03/07/2015 0448    NO GROWTH 5 DAYS Performed at [facility] 03/12/2015 FINAL 03/07/2015 0448      Labs: Results for orders placed or performed during the hospital encounter of 11/18/16 (from the past 48 hour(s))  Glucose, capillary     Status: Abnormal   Collection Time: 11/19/16 12:31 PM  Result Value Ref Range   Glucose-Capillary 183 (H) 65 - 99 mg/dL   Comment 1 Notify RN    Comment 2 Document in Chart   Heparin level (unfractionated)     Status:  Abnormal   Collection Time: 11/19/16  1:24 PM  Result Value Ref Range   Heparin Unfractionated 0.90 (H) 0.30 - 0.70 IU/mL    Comment:        IF HEPARIN RESULTS ARE BELOW EXPECTED VALUES, AND PATIENT DOSAGE HAS BEEN CONFIRMED, SUGGEST FOLLOW UP TESTING OF ANTITHROMBIN III LEVELS.   Glucose, capillary     Status: Abnormal   Collection Time: 11/19/16  5:04 PM  Result Value Ref Range   Glucose-Capillary 171 (H) 65 - 99 mg/dL   Comment 1 Notify RN    Comment 2 Document in Chart   Heparin level (unfractionated)     Status: None   Collection Time: 11/19/16  9:19 PM  Result Value Ref Range   Heparin Unfractionated 0.64 0.30 - 0.70 IU/mL    Comment:        IF HEPARIN RESULTS ARE BELOW EXPECTED VALUES, AND PATIENT DOSAGE HAS BEEN CONFIRMED, SUGGEST FOLLOW UP TESTING OF ANTITHROMBIN III LEVELS.   Glucose, capillary     Status: Abnormal   Collection Time: 11/19/16 10:55 PM  Result Value Ref Range   Glucose-Capillary 152 (H) 65 - 99 mg/dL  Glucose, capillary     Status: Abnormal   Collection Time: 11/20/16  3:55 AM  Result Value Ref Range   Glucose-Capillary 115 (H) 65 - 99 mg/dL  Heparin level (unfractionated)     Status: None   Collection Time: 11/20/16  4:06 AM  Result Value Ref Range   Heparin Unfractionated 0.62 0.30 - 0.70 IU/mL    Comment:        IF HEPARIN RESULTS ARE BELOW EXPECTED VALUES, AND PATIENT DOSAGE HAS BEEN CONFIRMED, SUGGEST FOLLOW UP TESTING OF ANTITHROMBIN III LEVELS.   CBC     Status: None   Collection Time: 11/20/16  4:06 AM  Result Value Ref Range   WBC 7.3 4.0 - 10.5 K/uL   RBC 4.38 4.22 - 5.81 MIL/uL   Hemoglobin 13.2 13.0 - 17.0 g/dL   HCT 40.3 39.0 - 52.0 %   MCV 92.0 78.0 - 100.0 fL   MCH 30.1 26.0 - 34.0 pg   MCHC 32.8 30.0 - 36.0 g/dL   RDW 13.0 11.5 - 15.5 %   Platelets 199 150 - 400 K/uL  Glucose, capillary     Status: Abnormal   Collection Time: 11/20/16  8:17 AM  Result Value Ref Range   Glucose-Capillary 140 (H) 65 - 99 mg/dL   Glucose, capillary     Status: Abnormal   Collection Time: 11/20/16 11:03 AM  Result Value Ref Range   Glucose-Capillary 144 (H) 65 - 99 mg/dL  Glucose, capillary     Status: Abnormal   Collection Time: 11/20/16  4:29 PM  Result Value Ref Range   Glucose-Capillary 146 (H) 65 - 99 mg/dL  Heparin level (unfractionated)     Status: None   Collection Time: 11/20/16  7:15 PM  Result Value Ref Range   Heparin Unfractionated 0.42 0.30 - 0.70 IU/mL    Comment:        IF HEPARIN RESULTS ARE BELOW EXPECTED VALUES, AND PATIENT DOSAGE HAS BEEN CONFIRMED, SUGGEST FOLLOW UP TESTING OF ANTITHROMBIN III LEVELS.   Glucose, capillary     Status: Abnormal   Collection Time: 11/20/16  9:59 PM  Result Value Ref Range   Glucose-Capillary 153 (H) 65 - 99 mg/dL  Heparin level (unfractionated)     Status: None   Collection Time: 11/21/16  3:37 AM  Result Value Ref Range   Heparin Unfractionated 0.41 0.30 - 0.70 IU/mL    Comment:        IF HEPARIN RESULTS ARE BELOW EXPECTED VALUES, AND PATIENT DOSAGE HAS BEEN CONFIRMED, SUGGEST FOLLOW UP TESTING OF ANTITHROMBIN III LEVELS.   CBC     Status: None   Collection Time: 11/21/16  3:37 AM  Result Value Ref Range   WBC 7.3 4.0 - 10.5 K/uL  RBC 4.41 4.22 - 5.81 MIL/uL   Hemoglobin 13.5 13.0 - 17.0 g/dL   HCT 40.6 39.0 - 52.0 %   MCV 92.1 78.0 - 100.0 fL   MCH 30.6 26.0 - 34.0 pg   MCHC 33.3 30.0 - 36.0 g/dL   RDW 12.8 11.5 - 15.5 %   Platelets 193 150 - 400 K/uL  Basic metabolic panel     Status: Abnormal   Collection Time: 11/21/16  3:37 AM  Result Value Ref Range   Sodium 135 135 - 145 mmol/L   Potassium 4.0 3.5 - 5.1 mmol/L   Chloride 101 101 - 111 mmol/L   CO2 19 (L) 22 - 32 mmol/L   Glucose, Bld 126 (H) 65 - 99 mg/dL   BUN 14 6 - 20 mg/dL   Creatinine, Ser 1.17 0.61 - 1.24 mg/dL   Calcium 9.0 8.9 - 10.3 mg/dL   GFR calc non Af Amer >60 >60 mL/min   GFR calc Af Amer >60 >60 mL/min    Comment: (NOTE) The eGFR has been calculated using  the CKD EPI equation. This calculation has not been validated in all clinical situations. eGFR's persistently <60 mL/min signify possible Chronic Kidney Disease.    Anion gap 15 5 - 15  Glucose, capillary     Status: Abnormal   Collection Time: 11/21/16  8:53 AM  Result Value Ref Range   Glucose-Capillary 145 (H) 65 - 99 mg/dL  Glucose, capillary     Status: Abnormal   Collection Time: 11/21/16 11:34 AM  Result Value Ref Range   Glucose-Capillary 159 (H) 65 - 99 mg/dL     Lipid Panel     Component Value Date/Time   CHOL  12/22/2009 1000    91        ATP III CLASSIFICATION:  <200     mg/dL   Desirable  200-239  mg/dL   Borderline High  >=240    mg/dL   High          TRIG 99 12/22/2009 1000   HDL 18 (L) 12/22/2009 1000   CHOLHDL 5.1 12/22/2009 1000   VLDL 20 12/22/2009 1000   LDLCALC  12/22/2009 1000    53        Total Cholesterol/HDL:CHD Risk Coronary Heart Disease Risk Table                     Men   Women  1/2 Average Risk   3.4   3.3  Average Risk       5.0   4.4  2 X Average Risk   9.6   7.1  3 X Average Risk  23.4   11.0        Use the calculated Patient Ratio above and the CHD Risk Table to determine the patient's CHD Risk.        ATP III CLASSIFICATION (LDL):  <100     mg/dL   Optimal  100-129  mg/dL   Near or Above                    Optimal  130-159  mg/dL   Borderline  160-189  mg/dL   High  >190     mg/dL   Very High     Lab Results  Component Value Date   HGBA1C 7.7 (H) 12/21/2013   HGBA1C 9.2 (H) 03/13/2011   HGBA1C (H) 07/01/2010    6.7 (NOTE)  According to the ADA Clinical Practice Recommendations for 2011, when HbA1c is used as a screening test:   >=6.5%   Diagnostic of Diabetes Mellitus           (if abnormal result  is confirmed)  5.7-6.4%   Increased risk of developing Diabetes Mellitus  References:Diagnosis and Classification of Diabetes Mellitus,Diabetes  IWLN,9892,11(HERDE 1):S62-S69 and Standards of Medical Care in         Diabetes - 2011,Diabetes YCXK,4818,56  (Suppl 1):S11-S61.     Lab Results  Component Value Date   [facility]  12/22/2009    53        Total Cholesterol/HDL:CHD Risk Coronary Heart Disease Risk Table                     Men   Women  1/2 Average Risk   3.4   3.3  Average Risk       5.0   4.4  2 X Average Risk   9.6   7.1  3 X Average Risk  23.4   11.0        Use the calculated Patient Ratio above and the CHD Risk Table to determine the patient's CHD Risk.        ATP III CLASSIFICATION (LDL):  <100     mg/dL   Optimal  100-129  mg/dL   Near or Above                    Optimal  130-159  mg/dL   Borderline  160-189  mg/dL   High  >190     mg/dL   Very High   CREATININE 1.17 11/21/2016     HPI :  Brief Narrative Samuel Cairns Hinsonis a 66 year old male with medical history significant for pulmonary embolism in 2012, diabetes mellitus type 2 with gastroparesis, hypertension, left BKA, CAD and OSA presented with one week history of progressive shortness of breath with exertion. Upon evaluation on the ED patient was found to be mildly hypoxic with saturations of 89% CT Angio was done which showed submassive pulmonary embolism with a CT evidence of right heart strain, He was admitted to [facility], vascular surgery was consulted who recommended IVC filter placement on a permanent basis, he was subsequently transferred to [facility] on 11/19/2016 for IVC filter placement, he was transferred to my care on 11/20/2016 morning.   HOSPITAL COURSE:   1.Submassive PE with CT evidence of right heart strain. Second PE since 2012, was not on anticoagulation prior to this admission, currently on heparin drip, vascular surgery was consulted , Status post IVC filter placement  . Thereafter plan is to anticoagulate him lifelong, DC heparin and started  patient on Eliquis    2. ARF versus CKD stage III. Creatinine around  1.3, being gently hydrated, repeat creatinine 1.17  3. DM type II with diabetic gastroparesis. On Levemir and sliding scale, monitor CBGs P Accu-Chek stable Recent Labs       Lab Results  Component Value Date   HGBA1C 7.7 (H) 12/21/2013        4.Diabetic gastroparesis with intermittent nausea. Supportive care with Phenergan and Zofran, premeal Reglan trial.  5. Dyslipidemia. Continue statin.  6. GERD. On PPI.  7. CAD. Asymptomatic, continue statin for secondary prevention. Will be on anticoagulation for now for #1.  8. 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 Has prosthetic leg, PT eval   does use a wheelchair at home.  Physical  therapy recommended home health  9. Morbid obesity. Follow with PCP for weight loss.  10. HTN - resume Norvasc     Discharge Exam: *  Blood pressure 121/61, pulse 86, temperature 97.8 F (36.6 C), temperature source Oral, resp. rate 14, height _0  (1.905 m), weight 129.4 kg (285 lb 4.8 oz), SpO2 97 %.  Awake Alert, Oriented X 3, No new F.N deficits, Normal affect [place].AT,PERRAL Supple Neck,No JVD, No cervical lymphadenopathy appriciated.  Symmetrical Chest wall movement, Good air movement bilaterally, CTAB RRR,No Gallops,Rubs or new Murmurs, No Parasternal Heave +ve B.Sounds, Abd Soft, No tenderness, No organomegaly appriciated, No rebound - guarding or rigidity. 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   Specialty:  Home Health Services Why:  Physical Therapy Contact information: [name] [address] [phone]        Jani Gravel, MD. Call.   Specialty:  Internal Medicine Why:  Hospital follow-up in 3-5 days Contact information: [address] [place] [address] [phone]           Signed: Reyne Dumas 11/21/2016, 12:09 PM        Time  spent >1 hour

## 2016-11-21 NOTE — Progress Notes (Signed)
ANTICOAGULATION CONSULT NOTE - Follow Up Consult  Pharmacy Consult for Heparin -> Eliquis Indication: pulmonary embolus  Allergies  Allergen Reactions  . Codeine Other (See Comments)    REACTION: GI Upset  . Gabapentin Hives, Itching and Rash    REACTION: rash/hives (per patient, was a reaction to an inactive ingredient in another mgf brand). The patient stated that he does take this medication now and it doesn't cause a rash any more.  . Nabumetone Itching, Rash and Other (See Comments)    REACTION: sick on stomach and rash    Patient Measurements: Height: 6\' 3"  (190.5 cm) Weight: 285 lb 4.8 oz (129.4 kg) IBW/kg (Calculated) : 84.5 Heparin Dosing Weight: 113 kg  Vital Signs: Temp: 97.8 F (36.6 C) (06/06 0830) Temp Source: Oral (06/06 0830) BP: 151/83 (06/06 0830) Pulse Rate: 92 (06/06 0830)  Labs:  Recent Labs  11/18/16 1853 11/19/16 0513  11/20/16 0406 11/20/16 1915 11/21/16 0337  HGB 15.2 14.0  --  13.2  --  13.5  HCT 44.2 42.0  --  40.3  --  40.6  PLT 227 219  --  199  --  193  APTT 27  --   --   --   --   --   LABPROT 13.7  --   --   --   --   --   INR 1.04  --   --   --   --   --   HEPARINUNFRC  --  0.94*  < > 0.62 0.42 0.41  CREATININE 1.36* 1.38*  --   --   --  1.17  < > = values in this interval not displayed.  Estimated Creatinine Clearance: 93.7 mL/min (by C-G formula based on SCr of 1.17 mg/dL).  Assessment:   66 y.o M with h[facility] to [facility] ED on 11/18/16 with c/o SOB. Chest CTA showed new PE with right heart strain consistent with at submassive PE. Heparin drip started on admission for PE. LE doppler on 11/19/16 neg for DVT.    S/p IVC filter on 6/5.  Groin site noted without significant hematoma.     Heparin level therapeutic this morning (0.41) on 1450 units/hr.   To transition from IV heparin to Eliquis.    Goal of Therapy:  Heparin level 0.3-0.7 units/ml appropriate Eliquis dose for indication Monitor platelets by  anticoagulation protocol: Yes   Plan:   Eliquis 10 mg BID x 1 week, then 5 mg BID.  Stop IV heparin when giving first Eliquis dose.  Dennie FettersEgan, Etoy Mcdonnell Donovan, RPh Pager: [phone] 11/21/2016,10:34 AM

## 2016-11-21 NOTE — Care Management Note (Addendum)
Case Management Note  Patient Details  Name: Samuel [name] MRN: [id number] Date of Birth: 16-Nov-1950  Subjective/Objective:  Pt presented for SOB. Positive for PE- Post IVC Filter. Pt is from home alone and has family as caregivers that live next door. Pt is active with [facility] for PT Services. CM did ask MD to place resumption orders and F2F. Plan for d/c home 11-21-16.                  Action/Plan: CM did make [facility] aware of d/c. Pt will need ambulance transport home. CM to call [facility] once d/c order completed. No further needs from CM at this time.   Expected Discharge Date:                  Expected Discharge Plan:  Home w Home Health Services  In-House Referral:  NA  Discharge planning Services  CM Consult  Post Acute Care Choice:  Home Health, Resumption of Svcs/PTA Provider Choice offered to:  NA  DME Arranged:  N/A DME Agency:  NA  HH Arranged:  PT [name]  Status of Service:  Completed, signed off  If discussed at Long Length of Stay Meetings, dates discussed:    Additional Comments: 1205 11-21-16 Tomi BambergerBrenda Graves-Bigelow, RN, BSN [phone] [facility] called for 1 pm transport home.  30 day free Eliquis card provided. [facility] and [facility] will [facility] today. 30 day free information [facility] [facility] No further needs from CM at this time.  #2. S/W KATHERINE @ [facility] # [id number]- OPT- 3   ELIQUIS 10 MG -NO   1. ELIQUIS 2.50 MG BID  COVER- YES  CO-PAY- $ 40.00  TIER- 3 DRUG  PRIOR APPROVAL- NO   2. ELIQUIS 5 MG BID  COVER- YES  CO-PAY- $ 40.00  TIER- 3 DRUG  PRIOR APPROVAL- NO   PREFERRED PHARMACY : [facility], [facility], AND  [facility] Gala LewandowskyGraves-Bigelow, Navraj Dreibelbis Kaye, RN 11/21/2016, 11:10 AM

## 2016-11-21 NOTE — Progress Notes (Signed)
Still some nausea but improved  Vitals:   11/20/16 1526 11/20/16 2000 11/21/16 0019 11/21/16 0510  BP: (!) 127/52 (!) 155/81 (!) 141/70 133/64  Pulse: 88 77 78 80  Resp: (!) 33 19 (!) 27 14  Temp: 97.6 F (36.4 C) 97.7 F (36.5 C) 99 F (37.2 C) 99.4 F (37.4 C)  TempSrc: Oral Oral Oral Oral  SpO2: 100% 97% 97% 96%  Weight:    285 lb 4.8 oz (129.4 kg)  Height:        IVC filter in good position on xray Creatinine normal No significant groin hematoma   Will sign off  Fabienne Brunsharles Fields, MD [facility] Office: [phone] Pager: [phone]

## 2016-11-21 NOTE — Discharge Instructions (Addendum)
Bleeding Precautions When on Anticoagulant Therapy WHAT IS ANTICOAGULANT THERAPY? Anticoagulant therapy is taking medicine to prevent or reduce blood clots. It is also called blood thinner therapy. Blood clots that form in your blood vessels can be dangerous. They can break loose and travel to your heart, lungs, or brain. This increases your risk of a heart attack or stroke. Anticoagulant therapy causes blood to clot more slowly. You may need anticoagulant therapy if you have:  A medical condition that increases the likelihood that blood clots will form.  A heart defect or a problem with heart rhythm. It is also a common treatment after heart surgery, such as valve replacement. WHAT ARE COMMON TYPES OF ANTICOAGULANT THERAPY? Anticoagulant medicine can be injected or taken by mouth.If you need anticoagulant therapy quickly at the hospital, the medicine may be injected under your skin or given through an IV tube. Heparin is a common example of an anticoagulant that you may get at the hospital. Most anticoagulant therapy is in the form of pills that you take at home every day. These may include:  Aspirin. This common blood thinner works by preventing blood cells (platelets) from sticking together to form a clot. Aspirin is not as strong as anticoagulants that slow down the time that it takes for your body to form a clot.  Clopidogrel. This is a newer type of drug that affects platelets. It is stronger than aspirin.  Warfarin. This is the most common anticoagulant. It changes the way your body uses vitamin K, a vitamin that helps your blood to clot. The risk of bleeding is higher with warfarin than with aspirin. You will need frequent blood tests to make sure you are taking the safest amount.  New anticoagulants. Several new drugs have been approved. They are all taken by mouth. Studies show that these drugs work as well as warfarin. They do not require blood testing. They may cause less bleeding  risk than warfarin. WHAT DO I NEED TO REMEMBER WHEN TAKING ANTICOAGULANT THERAPY? Anticoagulant therapy decreases your risk of forming a blood clot, but it increases your risk of bleeding. Work closely with your health care provider to make sure you are taking your medicine safely. These tips can help:  Learn ways to reduce your risk of bleeding.  If you are taking warfarin: ? Have blood tests as ordered by your health care provider. ? Do not make any sudden changes to your diet. Vitamin K in your diet can make warfarin less effective. ? Do not get pregnant. This medicine may cause birth defects.  Take your medicine at the same time every day. If you forget to take your medicine, take it as soon as you remember. If you miss a whole day, do not double your dose of medicine. Take your normal dose and call your health care provider to check in.  Do not stop taking your medicine on your own.  Tell your health care provider before you start taking any new medicine, vitamin, or herbal product. Some of these could interfere with your therapy.  Tell all of your health care providers that you are on anticoagulant therapy.  Do not have surgery, medical procedures, or dental work until you tell your health care provider that you are on anticoagulant therapy. WHAT CAN AFFECT HOW ANTICOAGULANTS WORK? Certain foods, vitamins, medicines, supplements, and herbal medicines change the way that anticoagulant therapy works. They may increase or decrease the effects of your anticoagulant therapy. Either result can be dangerous for you.  Many over-the-counter medicines for pain, colds, or stomach problems interfere with anticoagulant therapy. Take these only as told by your health care provider.  Do not drink alcohol. It can interfere with your medicine and increase your risk of an injury that causes bleeding.  If you are taking warfarin, do not begin eating more foods that contain vitamin K. These include  leafy green vegetables. Ask your health care provider if you should avoid any foods. WHAT ARE SOME WAYS TO PREVENT BLEEDING? You can prevent bleeding by taking certain precautions:  Be extra careful when you use knives, scissors, or other sharp objects.  Use an electric razor instead of a blade.  Do not use toothpicks.  Use a soft toothbrush.  Wear shoes that have nonskid soles.  Use bath mats and handrails in your bathroom.  Wear gloves while you do yard work.  Wear a helmet when you ride a bike.  Wear your seat belt.  Prevent falls by removing loose rugs and extension cords from areas where you walk.  Do not play contact sports or participate in other activities that have a high risk of injury. WHEN SHOULD I CONTACT MY HEALTH CARE PROVIDER? Call your health care provider if:  You miss a dose of medicine: ? And you are not sure what to do. ? For more than one day.  You have: ? Menstrual bleeding that is heavier than normal. ? Blood in your urine. ? A bloody nose or bleeding gums. ? Easy bruising. ? Blood in your stool (feces) or have black and tarry stool. ? Side effects from your medicine.  You feel weak or dizzy.  You become pregnant. Seek immediate medical care if:  You have bleeding that will not stop.  You have sudden and severe headache or belly pain.  You vomit or you cough up bright red blood.  You have a severe blow to your head. WHAT ARE SOME QUESTIONS TO ASK MY HEALTH CARE PROVIDER?  What is the best anticoagulant therapy for my condition?  What side effects should I watch for?  When should I take my medicine? What should I do if I forget to take it?  Will I need to have regular blood tests?  Do I need to change my diet? Are there foods or drinks that I should avoid?  What activities are safe for me?  What should I do if I want to get pregnant? This information is not intended to replace advice given to you by your health care provider.  Make sure you discuss any questions you have with your health care provider. Document Released: 05/16/2015 Document Reviewed: 05/16/2015 Elsevier Interactive Patient Education  2017 [employer] Inc[employer]   Gastroparesis Gastroparesis, also called delayed gastric emptying, is a condition in which food takes longer than normal to empty from the stomach. The condition is usually long-lasting (chronic). What are the causes? This condition may be caused by:  An endocrine disorder, such as hypothyroidism or diabetes. Diabetes is the most common cause of this condition.  A nervous system disease, such as Parkinson disease or multiple sclerosis.  Cancer, infection, or surgery of the stomach or vagus nerve.  A connective tissue disorder, such as scleroderma.  Certain medicines.  In most cases, the cause is not known. What increases the risk? This condition is more likely to develop in:  People with certain disorders, including endocrine disorders, eating disorders, amyloidosis, and scleroderma.  People with certain diseases, including Parkinson disease or multiple sclerosis.  People with  cancer or infection of the stomach or vagus nerve.  People who have had surgery on the stomach or vagus nerve.  People who take certain medicines.  Women.  What are the signs or symptoms? Symptoms of this condition include:  An early feeling of fullness when eating.  Nausea.  Weight loss.  Vomiting.  Heartburn.  Abdominal bloating.  Inconsistent blood glucose levels.  Lack of appetite.  Acid from the stomach coming up into the esophagus (gastroesophageal reflux).  Spasms of the stomach.  Symptoms may come and go. How is this diagnosed? This condition is diagnosed with tests, such as:  Tests that check how long it takes food to move through the stomach and intestines. These tests include: ? Upper gastrointestinal (GI) series. In this test, X-rays of the intestines are taken after you  drink a liquid. The liquid makes the intestines show up better on the X-rays. ? Gastric emptying scintigraphy. In this test, scans are taken after you eat food that contains a small amount of radioactive material. ? Wireless capsule GI monitoring system. This test involves swallowing a capsule that records information about movement through the stomach.  Gastric manometry. This test measures electrical and muscular activity in the stomach. It is done with a thin tube that is passed down the throat and into the stomach.  Endoscopy. This test checks for abnormalities in the lining of the stomach. It is done with a long, thin tube that is passed down the throat and into the stomach.  An ultrasound. This test can help rule out gallbladder disease or pancreatitis as a cause of your symptoms. It uses sound waves to take pictures of the inside of your body.  How is this treated? There is no cure for gastroparesis. This condition may be managed with:  Treatment of the underlying condition causing the gastroparesis.  Lifestyle changes, including exercise and dietary changes. Dietary changes can include: ? Changes in what and when you eat. ? Eating smaller meals more often. ? Eating low-fat foods. ? Eating low-fiber forms of high-fiber foods, such as cooked vegetables instead of raw vegetables. ? Having liquid foods in place of solid foods. Liquid foods are easier to digest.  Medicines. These may be given to control nausea and vomiting and to stimulate stomach muscles.  Getting food through a feeding tube. This may be done in severe cases.  A gastric neurostimulator. This is a device that is inserted into the body with surgery. It helps improve stomach emptying and control nausea and vomiting.  Follow these instructions at home:  Follow your health care provider's instructions about exercise and diet.  Take medicines only as directed by your health care provider. Contact a health care provider  if:  Your symptoms do not improve with treatment.  You have new symptoms. Get help right away if:  You have severe abdominal pain that does not improve with treatment.  You have nausea that does not go away.  You cannot keep fluids down. This information is not intended to replace advice given to you by your health care provider. Make sure you discuss any questions you have with your health care provider. Document Released: 06/04/2005 Document Revised: 11/10/2015 Document Reviewed: 05/31/2014 Elsevier Interactive Patient Education  2018 [employer]   Pulmonary Embolism A pulmonary embolism (PE) is a sudden blockage or decrease of blood flow in one lung or both lungs. Most blockages come from a blood clot that travels from the legs or the pelvis to the lungs. PE  is a dangerous and potentially life-threatening condition if it is not treated right away. What are the causes? A pulmonary embolism occurs most commonly when a blood clot travels from one of your veins to your lungs. Rarely, PE is caused by air, fat, amniotic fluid, or part of a tumor traveling through your veins to your lungs. What increases the risk? A PE is more likely to develop in:  People who smoke.  People who areolder, especially over 11 years of age.  People who are overweight (obese).  People who sit or lie still for a long time, such as during long-distance travel (over 4 hours), bed rest, hospitalization, or during recovery from certain medical conditions like a stroke.  People who do not engage in much physical activity (sedentary lifestyle).  People who have chronic breathing disorders.  People whohave a personal or family history of blood clots or blood clotting disease.  People whohave peripheral vascular disease (PVD), diabetes, or some types of cancer.  People who haveheart disease, especially if the person had a recent heart attack or has congestive heart failure.  People who have  neurological diseases that affect the legs (leg paresis).  People who have had a traumatic injury, such as breaking a hip or leg.  People whohave recently had major or lengthy surgery, especially on the hip, knee, or abdomen.  People who have hada central line placed inside a large vein.  People who takemedicines that contain the hormone estrogen. These include birth control pills and hormone replacement therapy.  Pregnancy or during childbirth or the postpartum period.  What are the signs or symptoms? The symptoms of a PE usually start suddenly and include:  Shortness of breath while active or at rest.  Coughing or coughing up blood or blood-tinged mucus.  Chest pain that is often worse with deep breaths.  Rapid or irregular heartbeat.  Feeling light-headed or dizzy.  Fainting.  Feelinganxious.  Sweating.  There may also be pain and swelling in a leg if that is where the blood clot started. These symptoms may represent a serious problem that is an emergency. Do not wait to see if the symptoms will go away. Get medical help right away. Call your local emergency services (911 in the [place] Do not drive yourself to the hospital. How is this diagnosed? Your health care provider will take a medical history and perform a physical exam. You may also have other tests, including:  Blood tests to assess the clotting properties of your blood, assess oxygen levels in your blood, and find blood clots.  Imaging tests, such as CT, ultrasound, MRI, X-ray, and other tests to see if you have clots anywhere in your body.  An electrocardiogram (ECG) to look for heart strain from blood clots in the lungs.  How is this treated? The main goals of PE treatment are:  To stop a blood clot from growing larger.  To stop new blood clots from forming.  The type of treatment that you receive depends on many factors, such as the cause of your PE, your risk for bleeding or developing more clots,  and other medical conditions that you have. Sometimes, a combination of treatments is necessary. This condition may be treated with:  Medicines, including newer oral blood thinners (anticoagulants), warfarin, low molecular weight heparins, thrombolytics, or heparins.  Wearing compression stockings or using different types of devices.  Surgery (rare) to remove the blood clot or to place a filter in your abdomen to stop the blood  clot from traveling to your lungs.  Treatments for a PE are often divided into immediate treatment, long-term treatment (up to 3 months after PE), and extended treatment (more than 3 months after PE). Your treatment may continue for several months. This is called maintenance therapy, and it is used to prevent the forming of new blood clots. You can work with your health care provider to choose the treatment program that is best for you. What are anticoagulants? Anticoagulants are medicines that treat PEs. They can stop current blood clots from growing and stop new clots from forming. They cannot dissolve existing clots. Your body dissolves clots by itself over time. Anticoagulants are given by mouth, by injection, or through an IV tube. What are thrombolytics? Thrombolytics are clot-dissolving medicines that are used to dissolve a PE. They carry a high risk of bleeding, so they tend to be used only in severe cases or if you have very low blood pressure. Follow these instructions at home: If you are taking a newer oral anticoagulant:  Take the medicine every single day at the same time each day.  Understand what foods and drugs interact with this medicine.  Understand that there are no regular blood tests required when using this medicine.  Understandthe side effects of this medicine, including excessive bruising or bleeding. Ask your health care provider or pharmacist about other possible side effects. If you are taking warfarin:  Understand how to take warfarin and  know which foods can affect how warfarin works in [employer].  Understand that it is dangerous to taketoo much or too little warfarin. Too much warfarin increases the risk of bleeding. Too little warfarin continues to allow the risk for blood clots.  Follow your PT and INR blood testing schedule. The PT and INR results allow your health care provider to adjust your dose of warfarin. It is very important that you have your PT and INR tested as often as told by your health care provider.  Avoid major changes in your diet, or tell your health care provider before you change your diet. Arrange a visit with a registered dietitian to answer your questions. Many foods, especially foods that are high in vitamin K, can interfere with warfarin and affect the PT and INR results. Eat a consistent amount of foods that are high in vitamin K, such as: ? Spinach, kale, broccoli, cabbage, collard greens, turnip greens, Brussels sprouts, peas, cauliflower, seaweed, and parsley. ? Beef liver and pork liver. ? Green tea. ? Soybean oil.  Tell your health care provider about any and all medicines, vitamins, and supplements that you take, including aspirin and other over-the-counter anti-inflammatory medicines. Be especially cautious with aspirin and anti-inflammatory medicines. Do not take those before you ask your health care provider if it is safe to do so. This is important because many medicines can interfere with warfarin and affect the PT and INR results.  Do not start or stop taking any over-the-counter or prescription medicine unless your health care provider or pharmacist tells you to do so. If you take warfarin, you will also need to do these things:  Hold pressure over cuts for longer than usual.  Tell your dentist and other health care providers that you are taking warfarin before you have any procedures in which bleeding may occur.  Avoid alcohol or drink very small amounts. Tell your health care  provider if you change your alcohol intake.  Do not use tobacco products, including cigarettes, chewing tobacco, and e-cigarettes. If  you need help quitting, ask your health care provider.  Avoid contact sports.  General instructions  Take over-the-counter and prescription medicines only as told by your health care provider. Anticoagulant medicines can have side effects, including easy bruising and difficulty stopping bleeding. If you are prescribed an anticoagulant, you will also need to do these things: ? Hold pressure over cuts for longer than usual. ? Tell your dentist and other health care providers that you are taking anticoagulants before you have any procedures in which bleeding may occur. ? Avoid contact sports.  Wear a medical alert bracelet or carry a medical alert card that says you have had a PE.  Ask your health care provider how soon you can go back to your normal activities. Stay active to prevent new blood clots from forming.  Make sure to exercise while traveling or when you have been sitting or standing for a long period of time. It is very important to exercise. Exercise your legs by walking or by tightening and relaxing your leg muscles often. Take frequent walks.  Wear compression stockings as told by your health care provider to help prevent more blood clots from forming.  Do not use tobacco products, including cigarettes, chewing tobacco, and e-cigarettes. If you need help quitting, ask your health care provider.  Keep all follow-up appointments with your health care provider. This is important. How is this prevented? Take these actions to decrease your risk of developing another PE:  Exercise regularly. For at least 30 minutes every day, engage in: ? Activity that involves moving your arms and legs. ? Activity that encourages good blood flow through your body by increasing your heart rate.  Exercise your arms and legs every hour during long-distance travel  (over 4 hours). Drink plenty of water and avoid drinking alcohol while traveling.  Avoid sitting or lying in bed for long periods of time without moving your legs.  Maintain a weight that is appropriate for your height. Ask your health care provider what weight is healthy for you.  If you are a woman who is over 45 years of age, avoid unnecessary use of medicines that contain estrogen. These include birth control pills.  Do not smoke, especially if you take estrogen medicines. If you need help quitting, ask your health care provider.  If you are at very high risk for PE, wear compression stockings.  If you recently had a PE, have regularly scheduled ultrasound testing on your legs to check for new blood clots.  If you are hospitalized, prevention measures may include:  Early walking after surgery, as soon as your health care provider says that it is safe.  Receiving anticoagulants to prevent blood clots. If you cannot take anticoagulants, other options may be available, such as wearing compression stockings or using different types of devices.  Get help right away if:  You have new or increased pain, swelling, or redness in an arm or leg.  You have numbness or tingling in an arm or leg.  You have shortness of breath while active or at rest.  You have chest pain.  You have a rapid or irregular heartbeat.  You feel light-headed or dizzy.  You cough up blood.  You notice blood in your vomit, bowel movement, or urine.  You have a fever. These symptoms may represent a serious problem that is an emergency. Do not wait to see if the symptoms will go away. Get medical help right away. Call your local emergency services (911 in  the [place] Do not drive yourself to the hospital. This information is not intended to replace advice given to you by your health care provider. Make sure you discuss any questions you have with your health care provider. Document Released: 06/01/2000 Document  Revised: 11/10/2015 Document Reviewed: 09/29/2014 Elsevier Interactive Patient Education  2017 [employer]. Information on my medicine - ELIQUIS (apixaban)  This medication education was reviewed with me or my healthcare representative as part of my discharge preparation.  The pharmacist that spoke with me during my hospital stay was:  Dennie Fetters, [facility]  Why was Eliquis prescribed for you? Eliquis was prescribed to treat blood clots that may have been found in the veins of your legs (deep vein thrombosis) or in your lungs (pulmonary embolism) and to reduce the risk of them occurring again.  What do You need to know about Eliquis ? The starting dose is 10 mg (two 5 mg tablets) taken TWICE daily for the FIRST SEVEN (7) DAYS, then on (enter date)  11/28/16  the dose is reduced to ONE 5 mg tablet taken TWICE daily.  Eliquis may be taken with or without food.   Try to take the dose about the same time in the morning and in the evening. If you have difficulty swallowing the tablet whole please discuss with your pharmacist how to take the medication safely.  Take Eliquis exactly as prescribed and DO NOT stop taking Eliquis without talking to the doctor who prescribed the medication.  Stopping may increase your risk of developing a new blood clot.  Refill your prescription before you run out.  After discharge, you should have regular check-up appointments with your healthcare provider that is prescribing your Eliquis.    What do you do if you miss a dose? If a dose of ELIQUIS is not taken at the scheduled time, take it as soon as possible on the same day and twice-daily administration should be resumed. The dose should not be doubled to make up for a missed dose.  Important Safety Information A possible side effect of Eliquis is bleeding. You should call your healthcare provider right away if you experience any of the following: ? Bleeding from an injury or your nose that does not  stop. ? Unusual colored urine (red or dark brown) or unusual colored stools (red or black). ? Unusual bruising for unknown reasons. ? A serious fall or if you hit your head (even if there is no bleeding).  Some medicines may interact with Eliquis and might increase your risk of bleeding or clotting while on Eliquis. To help avoid this, consult your healthcare provider or pharmacist prior to using any new prescription or non-prescription medications, including herbals, vitamins, non-steroidal anti-inflammatory drugs (NSAIDs) and supplements.  This website has more information on Eliquis (apixaban): http://[email or web address].com/eliquis/home

## 2016-11-21 NOTE — Progress Notes (Signed)
Pt states he is feeling much better this morning, no nausea reported since early this am, able to eat breakfast without issues.  Raymon MuttonGwen Meghanne Pletz RN

## 2016-11-21 NOTE — Evaluation (Signed)
Physical Therapy Evaluation Patient Details Name: Samuel [name] MRN: [id number] DOB: [birth year] Today's Date: 11/21/2016   History of Present Illness  Samuel [name] is a 66 y.o. male with medical history significant of pulmonary embolism- 02/2011, DM 2 with gastroparesis, hypertension, left BKA, CAD, or OSA.   CT showed chest showed- submassive PE, with CT evidence of right heart strain, now s/p IVC filter 11/20/16.  Clinical Impression  Patient presents with limited mobility due to prolonged bedrest, impaired cardiopulmonary endurance and deconditioning.  He will benefit from skilled PT in the acute setting if not d/c to allow return home with caregiver assist and to resume HHPT.    Follow Up Recommendations Home health PT    Equipment Recommendations  None recommended by PT    Recommendations for Other Services       Precautions / Restrictions Precautions Precautions: Fall Restrictions Weight Bearing Restrictions: No (Left BKA)      Mobility  Bed Mobility Overal bed mobility: Needs Assistance Bed Mobility: Supine to Sit     Supine to sit: HOB elevated;Min assist     General bed mobility comments: with heavy rail use, increased time, assist for anterior weight shift at EOB due to bed sunken in middle and pt leaning posterior struggling to sit forward  Transfers Overall transfer level: Needs assistance   Transfers: Squat Pivot Transfers     Squat pivot transfers: Min guard     General transfer comment: chair placed in front of patient (per his usual transfer method) and IV/monitor moved to allow pivot to R, assist for safety/line management  Ambulation/Gait                Stairs            Wheelchair Mobility    Modified Rankin (Stroke Patients Only)       Balance Overall balance assessment: Needs assistance   Sitting balance-Leahy Scale: Fair Sitting balance - Comments: once scooted to EOB able to sit without UE support (on level surface)                                      Pertinent Vitals/Pain Pain Assessment: 0-10 Pain Score: 6  Pain Location: shoulders and general arthritic pain Pain Descriptors / Indicators: Aching Pain Intervention(s): Monitored during session;Repositioned    Home Living Family/patient expects to be discharged to:: Private residence Living Arrangements: Alone Available Help at Discharge: Family;Available PRN/intermittently (cousins are caregivers and live next door and are there almost all the time) Type of Home: Apartment ([place]) Home Access: Level entry     Home Layout: One level Home Equipment: Tub bench;Grab bars - tub/shower;Grab bars - toilet;Hand held shower head;Walker - 4 wheels;Walker - 2 wheels;Other (comment) Additional Comments: prosthetic for L LE, has lift chair    Prior Function Level of Independence: Independent with assistive device(s)         Comments: can transfer unaided and use wheelchair in the home, needs PT for walking with prosthetic and walker (was getting HHPT until admission)     Hand Dominance        Extremity/Trunk Assessment   Upper Extremity Assessment Upper Extremity Assessment: LUE deficits/detail;RUE deficits/detail RUE Deficits / Details: AROM grossly WFL, strength shoulder flexion 4-/5 and painful, elbow flexion/extension 4/5 RUE Sensation: history of peripheral neuropathy LUE Deficits / Details: AROM grossly WFL, strength shoulder flexion 4-/5 and painful, elbow flexion/extension 4/5 LUE  Sensation: history of peripheral neuropathy    Lower Extremity Assessment Lower Extremity Assessment: LLE deficits/detail;RLE deficits/detail RLE Deficits / Details: AROM limited knee extension to about -20, strength grossly 4/5. limited ankle mobility (pt reports arthritic joints), noted ulcer anterior shin, and has areas on toes of ulceration  Absent sensation to knee RLE Sensation: history of peripheral neuropathy LLE Deficits / Details:  AROM limited knee extension to about -15 with L BKA, strength grossly 4/5 LLE Sensation: history of peripheral neuropathy    Cervical / Trunk Assessment Cervical / Trunk Assessment: Kyphotic;Other exceptions Cervical / Trunk Exceptions: forward head, rounded spine  Communication   Communication: No difficulties  Cognition Arousal/Alertness: Awake/alert Behavior During Therapy: WFL for tasks assessed/performed Overall Cognitive Status: Within Functional Limits for tasks assessed                                        General Comments      Exercises     Assessment/Plan    PT Assessment Patient needs continued PT services  PT Problem List Decreased strength;Decreased range of motion;Decreased balance;Decreased mobility;Cardiopulmonary status limiting activity       PT Treatment Interventions Functional mobility training;Balance training;Therapeutic exercise;Patient/family education;Wheelchair mobility training;DME instruction    PT Goals (Current goals can be found in the Care Plan section)  Acute Rehab PT Goals Patient Stated Goal: to go home today PT Goal Formulation: With patient Time For Goal Achievement: 11/28/16 Potential to Achieve Goals: Good    Frequency Min 3X/week   Barriers to discharge        Co-evaluation               AM-PAC PT "6 Clicks" Daily Activity  Outcome Measure Difficulty turning over in bed (including adjusting bedclothes, sheets and blankets)?: None Difficulty moving from lying on back to sitting on the side of the bed? : Total Difficulty sitting down on and standing up from a chair with arms (e.g., wheelchair, bedside commode, etc,.)?: Total Help needed moving to and from a bed to chair (including a wheelchair)?: A Little Help needed walking in hospital room?: A Little Help needed climbing 3-5 steps with a railing? : Total 6 Click Score: 13    End of Session Equipment Utilized During Treatment: Gait belt Activity  Tolerance: Patient tolerated treatment well Patient left: in chair;with call bell/phone within reach Nurse Communication: Mobility status;Other (comment) (Sats 96% on RA and RN stated OK for off O2) PT Visit Diagnosis: Muscle weakness (generalized) (M62.81)    Time: 1610-9604 PT Time Calculation (min) (ACUTE ONLY): 33 min   Charges:   PT Evaluation $PT Eval Moderate Complexity: 1 Procedure PT Treatments $Therapeutic Activity: 8-22 mins   PT G CodesSheran Lawless, [place] [phone] 11/21/2016   Elray Mcgregor 11/21/2016, 10:09 AM

## 2016-11-21 NOTE — Progress Notes (Signed)
 @IPLOG         PROGRESS NOTE                                                                                                                                                                                                             Patient Demographics:    Samuel [name], is a 66 y.o. male, DOB - [birth year], YNW:[id number]  Admit date - 11/18/2016   Admitting Physician Ejiroghene Wendall StadeE Emokpae, MD  Outpatient Primary MD for the patient is Pearson GrippeKim, James, MD  LOS - 3  Chief Complaint  Patient presents with  . Shortness of Breath  . Emesis  . Nausea       Brief Narrative Samuel [name] is a 66 year old male with medical history significant for pulmonary embolism in 2012, diabetes mellitus type 2 with gastroparesis, hypertension, left BKA, CAD and OSA presented with one week history of progressive shortness of breath with exertion. Upon evaluation on the ED patient was found to be mildly hypoxic with saturations of 89% CT Angio was done which showed submassive pulmonary embolism with a CT evidence of right heart strain, [facility], vascular surgery was consulted who recommended IVC filter placement on a permanent basis, he was subsequently transferred to [facility] on 11/19/2016 for IVC filter placement, he was transferred to my care on 11/20/2016 morning.   Subjective:    Samuel [name] today has,  Wants to go home, No chest pain, No abdominal pain - mild nausea Nausea, No new weakness tingling or numbness, No Cough - SOB.    Assessment  & Plan :     1.Submassive PE with CT evidence of right heart strain. Second PE since 2012, was not on anticoagulation prior to this admission, currently on heparin drip, vascular surgery was consulted , Status post IVC filter placement  . Thereafter plan is to anticoagulate him lifelong, DC heparin and start patient on Eliquis    2. ARF versus CKD stage III. Creatinine around 1.3, being gently hydrated, repeat creatinine  1.17  3. DM type II with diabetic gastroparesis. On Levemir and sliding scale, monitor CBGs P Accu-Chek stable Lab Results  Component Value Date   HGBA1C 7.7 (H) 12/21/2013       4.Diabetic gastroparesis with intermittent nausea. Supportive care with Phenergan and Zofran, premeal Reglan trial.  5. Dyslipidemia. Continue statin.  6. GERD. On PPI.  7. CAD. Asymptomatic, continue statin for secondary prevention. Will be on anticoagulation for now for #1.  8. Left diabetic foot ulcer  requiring left BKA. Has prosthetic leg, PT eval   does use a wheelchair at home. May require placement based on PT eval.  9. Morbid obesity. Follow with PCP for weight loss.  10. HTN - resume Norvasc as needed IV hydralazine added.     Diet : Diet Carb Modified Fluid consistency: Thin; Room service appropriate? Yes    Family Communication  : None  Code Status :  Full  Disposition Plan  :  PT/OT evaluation  Consults  :  Vas surgery  Procedures  :    CTA - PE with R.Heart starin  Venous U legs - -ve  TTE - Left ventricle: The cavity size was normal. There was moderate concentric hypertrophy. Systolic function was normal. The estimated ejection fraction was in the range of 60% to 65%. Wall motion was normal; there were no regional wall motion abnormalities. Doppler parameters are consistent with abnormal left ventricular relaxation (grade 1 diastolic dysfunction). There was no evidence of elevated ventricular filling pressure by Doppler parameters. - Aortic valve: There was no regurgitation. - Mitral valve: There was no regurgitation. - Left atrium: The atrium was normal in size. - Right ventricle: The cavity size was mildly dilated. Wall thickness was normal. Systolic function was normal. - Tricuspid valve: There was trivial regurgitation. - Pulmonary arteries: Systolic pressure was within the normal  range. - Inferior vena cava: The vessel was normal in size. - Pericardium, extracardiac:  There was no pericardial effusion.  DVT Prophylaxis  :   Heparin   Lab Results  Component Value Date   PLT 193 11/21/2016    Inpatient Medications  Scheduled Meds: . amLODipine  10 mg Oral Daily  . atorvastatin  30 mg Oral q1800  . ferrous sulfate  325 mg Oral QPM  . gabapentin  300 mg Oral TID  . insulin aspart  0-9 Units Subcutaneous TID WC  . insulin detemir  40 Units Subcutaneous BID  . metoCLOPramide (REGLAN) injection  5 mg Intravenous QAC breakfast  . sodium chloride flush  3 mL Intravenous Q12H   Continuous Infusions: . heparin 1,450 Units/hr (11/20/16 1159)   PRN Meds:.hydrALAZINE, metoprolol tartrate, [DISCONTINUED] ondansetron **OR** ondansetron (ZOFRAN) IV, pantoprazole, promethazine, traMADol  Antibiotics  :    Anti-infectives    None         Objective:   Vitals:   11/20/16 1526 11/20/16 2000 11/21/16 0019 11/21/16 0510  BP: (!) 127/52 (!) 155/81 (!) 141/70 133/64  Pulse: 88 77 78 80  Resp: (!) 33 19 (!) 27 14  Temp: 97.6 F (36.4 C) 97.7 F (36.5 C) 99 F (37.2 C) 99.4 F (37.4 C)  TempSrc: Oral Oral Oral Oral  SpO2: 100% 97% 97% 96%  Weight:    129.4 kg (285 lb 4.8 oz)  Height:        Wt Readings from Last 3 Encounters:  11/21/16 129.4 kg (285 lb 4.8 oz)  03/30/15 129.7 kg (286 lb)  03/15/15 129.8 kg (286 lb 2.5 oz)     Intake/Output Summary (Last 24 hours) at 11/21/16 0913 Last data filed at 11/21/16 6045  Gross per 24 hour  Intake           601.74 ml  Output             1500 ml  Net          -898.26 ml     Physical Exam  Awake Alert, Oriented X 3, No new F.N deficits, Normal affect [place].AT,PERRAL Supple Neck,No  JVD, No cervical lymphadenopathy appriciated.  Symmetrical Chest wall movement, Good air movement bilaterally, CTAB RRR,No Gallops,Rubs or new Murmurs, No Parasternal Heave +ve B.Sounds, Abd Soft, No tenderness, No organomegaly appriciated, No rebound - guarding or rigidity. 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, few old healed scabs R leg    Data Review:    CBC  Recent Labs Lab 11/18/16 1853 11/19/16 0513 11/20/16 0406 11/21/16 0337  WBC 9.2 9.6 7.3 7.3  HGB 15.2 14.0 13.2 13.5  HCT 44.2 42.0 40.3 40.6  PLT 227 219 199 193  MCV 91.3 92.3 92.0 92.1  MCH 31.4 30.8 30.1 30.6  MCHC 34.4 33.3 32.8 33.3  RDW 12.8 13.2 13.0 12.8  LYMPHSABS 1.1  --   --   --   MONOABS 0.4  --   --   --   EOSABS 0.0  --   --   --   BASOSABS 0.0  --   --   --     Chemistries   Recent Labs Lab 11/18/16 1853 11/19/16 0513 11/21/16 0337  NA 138 140 135  K 4.6 4.2 4.0  CL 101 107 101  CO2 16* 19* 19*  GLUCOSE 253* 201* 126*  BUN 22* 23* 14  CREATININE 1.36* 1.38* 1.17  CALCIUM 9.7 9.2 9.0  AST 19  --   --   ALT 21  --   --   ALKPHOS 52  --   --   BILITOT 1.4*  --   --    ------------------------------------------------------------------------------------------------------------------ No results for input(s): CHOL, HDL, LDLCALC, TRIG, CHOLHDL, LDLDIRECT in the last 72 hours.  Lab Results  Component Value Date   HGBA1C 7.7 (H) 12/21/2013   ------------------------------------------------------------------------------------------------------------------ No results for input(s): TSH, T4TOTAL, T3FREE, THYROIDAB in the last 72 hours.  Invalid input(s): FREET3 ------------------------------------------------------------------------------------------------------------------ No results for input(s): VITAMINB12, FOLATE, FERRITIN, TIBC, IRON, RETICCTPCT in the last 72 hours.  Coagulation profile  Recent Labs Lab 11/18/16 1853  INR 1.04     Recent Labs  11/18/16 1853  DDIMER 0.34    Cardiac Enzymes No results for input(s): CKMB, TROPONINI, MYOGLOBIN in the last 168 hours.  Invalid input(s): CK ------------------------------------------------------------------------------------------------------------------    Component Value Date/Time   BNP  62.1 11/19/2016 0513    Micro Results Recent Results (from the past 240 hour(s))  MRSA PCR Screening     Status: None   Collection Time: 11/19/16 12:52 AM  Result Value Ref Range Status   MRSA by PCR NEGATIVE NEGATIVE Final    Comment:        The GeneXpert MRSA Assay (FDA approved for NASAL specimens only), is one component of a comprehensive MRSA colonization surveillance program. It is not intended to diagnose MRSA infection nor to guide or monitor treatment for MRSA infections.     Radiology Reports Dg Chest 2 View  Result Date: 11/18/2016 CLINICAL DATA:  Subacute onset of nausea and vomiting. Shortness of breath. Initial encounter. EXAM: CHEST  2 VIEW COMPARISON:  Chest radiograph performed 03/07/2015 FINDINGS: The lungs are well-aerated. Left basilar airspace opacity raises concern for mild pneumonia. There is no evidence of pleural effusion or pneumothorax. The heart is borderline normal in size. No acute osseous abnormalities are seen. IMPRESSION: Left basilar airspace opacity raises concern for mild pneumonia. Electronically Signed   By: Roanna Raider M.D.   On: 11/18/2016 19:38   Ct Angio Chest Pe W/cm &/or Wo Cm  Result Date: 11/18/2016  CLINICAL DATA:  Dyspnea x1 week EXAM: CT ANGIOGRAPHY CHEST WITH CONTRAST TECHNIQUE: Multidetector CT imaging of the chest was performed using the standard protocol during bolus administration of intravenous contrast. Multiplanar CT image reconstructions and MIPs were obtained to evaluate the vascular anatomy. CONTRAST:  100 cc Isovue 370 IV COMPARISON:  Chest CT 03/13/2011 FINDINGS: Cardiovascular: Tiny segmental and subsegmental pulmonary emboli to the lower lobes with RV/LV ratio equaling 1.19 consistent with right heart strain. No pericardial effusion. No aortic aneurysm or dissection. Three-vessel coronary arteriosclerosis noted Mediastinum/Nodes: No enlarged mediastinal, hilar, or axillary lymph nodes. Thyroid gland, trachea, and esophagus  demonstrate no significant findings. Lungs/Pleura: Subsegmental atelectasis bilaterally. No pneumonic consolidation, effusion or pneumothorax. No dominant mass. Upper Abdomen: No acute abnormality. Musculoskeletal: No chest wall abnormality. No acute or significant osseous findings. Review of the MIP images confirms the above findings. IMPRESSION: 1. Positive for acute PE with CT evidence of right heart strain (RV/LV Ratio = 1.19) consistent with at least submassive (intermediate risk) PE. The presence of right heart strain has been associated with an increased risk of morbidity and mortality. Please activate Code PE by paging [phone]. Critical Value/emergent results were called by telephone at the time of interpretation on 11/18/2016 at 8:57 pm to PA [facility] , who verbally acknowledged these results. 2. Bibasilar subsegmental atelectasis.  No acute pulmonary disease. 3. Coronary arteriosclerosis. Electronically Signed   By: Tollie Eth M.D.   On: 11/18/2016 20:58   Dg Abd 2 Views  Result Date: 11/20/2016 CLINICAL DATA:  Intraoperative inferior vena caval filter placement. EXAM: Operative ABDOMEN - 2 VIEW COMPARISON:  None. FINDINGS: Three spot images from the C-arm fluoroscopic device, AP images of the abdomen are submitted for interpretation postoperatively. The initial 2 images demonstrate contrast injection into the intervening cava with no evidence of thrombus. The completion image shows an IVC filter placed in the infrarenal IVC. IMPRESSION: IVC filter placed into the infrarenal IVC. Electronically Signed   By: Hulan Saas M.D.   On: 11/20/2016 11:08   Dg Abd Portable 1v  Result Date: 11/20/2016 CLINICAL DATA:  Nausea EXAM: PORTABLE ABDOMEN - 1 VIEW COMPARISON:  Study obtained earlier in the day. FINDINGS: There is no bowel dilatation or air-fluid level. There is moderate stool in the colon. No free air. Lung bases are clear. There is a filter in the inferior vena cava positioned at the mid  lumbar level. There are surgical clips in the right upper quadrant. IMPRESSION: Bowel gas pattern unremarkable. No bowel obstruction or free air. Lung bases clear. Electronically Signed   By: Bretta Bang III M.D.   On: 11/20/2016 16:44   Dg Abd Portable 1v  Result Date: 11/20/2016 CLINICAL DATA:  IVC filter. EXAM: PORTABLE ABDOMEN - 1 VIEW COMPARISON:  11/20/2016.  Abdominal series 919 2016. FINDINGS: IVC filter noted with upper tip at the level of L2. Radiopacity is noted projected over upper pelvis. This could represent a catheter or syringe. Clinical suggested. Surgical clips right upper quadrant. Contrast in the kidneys and bladder. Air-filled loops of small and large bowel noted most likely adynamic ileus. IMPRESSION: 1.  IVC filter noted with its upper tip at the mid L2 level. 2. Mild adynamic ileus. Electronically Signed   By: Maisie Fus  Register   On: 11/20/2016 11:34   Dg C-arm 1-60 Min  Result Date: 11/20/2016 CLINICAL DATA:  Intraoperative inferior vena caval filter placement. EXAM: Operative ABDOMEN - 2 VIEW COMPARISON:  None. FINDINGS: Three spot images from the C-arm fluoroscopic device,  AP images of the abdomen are submitted for interpretation postoperatively. The initial 2 images demonstrate contrast injection into the intervening cava with no evidence of thrombus. The completion image shows an IVC filter placed in the infrarenal IVC. IMPRESSION: IVC filter placed into the infrarenal IVC. Electronically Signed   By: Hulan Saas M.D.   On: 11/20/2016 11:08    Time Spent in minutes  30   Richarda Overlie M.D on 11/21/2016 at 9:13 AM  Between 7am to 7pm - Pager [phone] ( page via [email or web address], text pages only, please mention full 10 digit call back number). After 7pm go to [email or web address] - password [facility]

## 2016-11-22 NOTE — Consult Note (Signed)
           [name]  11/22/2016  Samuel BusingJohn D [name] 23-Jan-1951 [id number]   Went to see patientat the bedsideto identify possible discharge needs but he was alreadydischarged.  Patient was discharged home with home health services ([name]).   Primary care provider's office called (Caren)to notify of patient's discharge and need for post hospital follow-up and transition of care. Notified of health issues needing follow-up as well. Made aware to refer [facility] to [facility] care management ifdeemed appropriatefor services.    For questions, please contact:  Wyatt HasteLorraine Jhaden Pizzuto, BSN, RN- [facility] Primary Care Navigator  Telephone: [phone] [facility]

## 2016-11-23 DIAGNOSIS — M25562 Pain in left knee: Secondary | ICD-10-CM | POA: Diagnosis not present

## 2016-11-23 DIAGNOSIS — E1143 Type 2 diabetes mellitus with diabetic autonomic (poly)neuropathy: Secondary | ICD-10-CM | POA: Diagnosis not present

## 2016-11-23 DIAGNOSIS — Z794 Long term (current) use of insulin: Secondary | ICD-10-CM | POA: Diagnosis not present

## 2016-11-23 DIAGNOSIS — Z955 Presence of coronary angioplasty implant and graft: Secondary | ICD-10-CM | POA: Diagnosis not present

## 2016-11-23 DIAGNOSIS — G4733 Obstructive sleep apnea (adult) (pediatric): Secondary | ICD-10-CM | POA: Diagnosis not present

## 2016-11-23 DIAGNOSIS — Z8631 Personal history of diabetic foot ulcer: Secondary | ICD-10-CM | POA: Diagnosis not present

## 2016-11-23 DIAGNOSIS — E11610 Type 2 diabetes mellitus with diabetic neuropathic arthropathy: Secondary | ICD-10-CM | POA: Diagnosis not present

## 2016-11-23 DIAGNOSIS — I2510 Atherosclerotic heart disease of native coronary artery without angina pectoris: Secondary | ICD-10-CM | POA: Diagnosis not present

## 2016-11-23 DIAGNOSIS — Z89512 Acquired absence of left leg below knee: Secondary | ICD-10-CM | POA: Diagnosis not present

## 2016-11-23 DIAGNOSIS — Z86711 Personal history of pulmonary embolism: Secondary | ICD-10-CM | POA: Diagnosis not present

## 2016-11-23 DIAGNOSIS — K3184 Gastroparesis: Secondary | ICD-10-CM | POA: Diagnosis not present

## 2016-11-23 DIAGNOSIS — M25662 Stiffness of left knee, not elsewhere classified: Secondary | ICD-10-CM | POA: Diagnosis not present

## 2016-11-23 DIAGNOSIS — Z7982 Long term (current) use of aspirin: Secondary | ICD-10-CM | POA: Diagnosis not present

## 2016-11-23 LAB — PROTHROMBIN GENE MUTATION

## 2016-11-23 LAB — FACTOR 5 LEIDEN

## 2016-11-26 DIAGNOSIS — G4733 Obstructive sleep apnea (adult) (pediatric): Secondary | ICD-10-CM | POA: Diagnosis not present

## 2016-11-26 DIAGNOSIS — Z8631 Personal history of diabetic foot ulcer: Secondary | ICD-10-CM | POA: Diagnosis not present

## 2016-11-26 DIAGNOSIS — E11610 Type 2 diabetes mellitus with diabetic neuropathic arthropathy: Secondary | ICD-10-CM | POA: Diagnosis not present

## 2016-11-26 DIAGNOSIS — K3184 Gastroparesis: Secondary | ICD-10-CM | POA: Diagnosis not present

## 2016-11-26 DIAGNOSIS — E1143 Type 2 diabetes mellitus with diabetic autonomic (poly)neuropathy: Secondary | ICD-10-CM | POA: Diagnosis not present

## 2016-11-26 DIAGNOSIS — E7800 Pure hypercholesterolemia, unspecified: Secondary | ICD-10-CM | POA: Diagnosis not present

## 2016-11-26 DIAGNOSIS — M25662 Stiffness of left knee, not elsewhere classified: Secondary | ICD-10-CM | POA: Diagnosis not present

## 2016-11-26 DIAGNOSIS — Z794 Long term (current) use of insulin: Secondary | ICD-10-CM | POA: Diagnosis not present

## 2016-11-26 DIAGNOSIS — Z89512 Acquired absence of left leg below knee: Secondary | ICD-10-CM | POA: Diagnosis not present

## 2016-11-26 DIAGNOSIS — Z955 Presence of coronary angioplasty implant and graft: Secondary | ICD-10-CM | POA: Diagnosis not present

## 2016-11-26 DIAGNOSIS — I10 Essential (primary) hypertension: Secondary | ICD-10-CM | POA: Diagnosis not present

## 2016-11-26 DIAGNOSIS — E1165 Type 2 diabetes mellitus with hyperglycemia: Secondary | ICD-10-CM | POA: Diagnosis not present

## 2016-11-26 DIAGNOSIS — Z86711 Personal history of pulmonary embolism: Secondary | ICD-10-CM | POA: Diagnosis not present

## 2016-11-26 DIAGNOSIS — I2510 Atherosclerotic heart disease of native coronary artery without angina pectoris: Secondary | ICD-10-CM | POA: Diagnosis not present

## 2016-11-26 DIAGNOSIS — M25562 Pain in left knee: Secondary | ICD-10-CM | POA: Diagnosis not present

## 2016-11-26 DIAGNOSIS — I2699 Other pulmonary embolism without acute cor pulmonale: Secondary | ICD-10-CM | POA: Diagnosis not present

## 2016-11-26 DIAGNOSIS — Z7982 Long term (current) use of aspirin: Secondary | ICD-10-CM | POA: Diagnosis not present

## 2016-11-27 DIAGNOSIS — Z8631 Personal history of diabetic foot ulcer: Secondary | ICD-10-CM | POA: Diagnosis not present

## 2016-11-27 DIAGNOSIS — I2510 Atherosclerotic heart disease of native coronary artery without angina pectoris: Secondary | ICD-10-CM | POA: Diagnosis not present

## 2016-11-27 DIAGNOSIS — E11610 Type 2 diabetes mellitus with diabetic neuropathic arthropathy: Secondary | ICD-10-CM | POA: Diagnosis not present

## 2016-11-27 DIAGNOSIS — Z89512 Acquired absence of left leg below knee: Secondary | ICD-10-CM | POA: Diagnosis not present

## 2016-11-27 DIAGNOSIS — Z7982 Long term (current) use of aspirin: Secondary | ICD-10-CM | POA: Diagnosis not present

## 2016-11-27 DIAGNOSIS — E1143 Type 2 diabetes mellitus with diabetic autonomic (poly)neuropathy: Secondary | ICD-10-CM | POA: Diagnosis not present

## 2016-11-27 DIAGNOSIS — M25562 Pain in left knee: Secondary | ICD-10-CM | POA: Diagnosis not present

## 2016-11-27 DIAGNOSIS — M25662 Stiffness of left knee, not elsewhere classified: Secondary | ICD-10-CM | POA: Diagnosis not present

## 2016-11-27 DIAGNOSIS — Z955 Presence of coronary angioplasty implant and graft: Secondary | ICD-10-CM | POA: Diagnosis not present

## 2016-11-27 DIAGNOSIS — Z86711 Personal history of pulmonary embolism: Secondary | ICD-10-CM | POA: Diagnosis not present

## 2016-11-27 DIAGNOSIS — Z794 Long term (current) use of insulin: Secondary | ICD-10-CM | POA: Diagnosis not present

## 2016-11-27 DIAGNOSIS — G4733 Obstructive sleep apnea (adult) (pediatric): Secondary | ICD-10-CM | POA: Diagnosis not present

## 2016-11-27 DIAGNOSIS — K3184 Gastroparesis: Secondary | ICD-10-CM | POA: Diagnosis not present

## 2016-11-29 DIAGNOSIS — M25562 Pain in left knee: Secondary | ICD-10-CM | POA: Diagnosis not present

## 2016-11-29 DIAGNOSIS — K3184 Gastroparesis: Secondary | ICD-10-CM | POA: Diagnosis not present

## 2016-11-29 DIAGNOSIS — E1143 Type 2 diabetes mellitus with diabetic autonomic (poly)neuropathy: Secondary | ICD-10-CM | POA: Diagnosis not present

## 2016-11-29 DIAGNOSIS — E11610 Type 2 diabetes mellitus with diabetic neuropathic arthropathy: Secondary | ICD-10-CM | POA: Diagnosis not present

## 2016-11-29 DIAGNOSIS — G4733 Obstructive sleep apnea (adult) (pediatric): Secondary | ICD-10-CM | POA: Diagnosis not present

## 2016-11-29 DIAGNOSIS — I2510 Atherosclerotic heart disease of native coronary artery without angina pectoris: Secondary | ICD-10-CM | POA: Diagnosis not present

## 2016-11-29 DIAGNOSIS — Z8631 Personal history of diabetic foot ulcer: Secondary | ICD-10-CM | POA: Diagnosis not present

## 2016-11-29 DIAGNOSIS — Z89512 Acquired absence of left leg below knee: Secondary | ICD-10-CM | POA: Diagnosis not present

## 2016-11-29 DIAGNOSIS — M25662 Stiffness of left knee, not elsewhere classified: Secondary | ICD-10-CM | POA: Diagnosis not present

## 2016-11-29 DIAGNOSIS — Z86711 Personal history of pulmonary embolism: Secondary | ICD-10-CM | POA: Diagnosis not present

## 2016-11-29 DIAGNOSIS — Z955 Presence of coronary angioplasty implant and graft: Secondary | ICD-10-CM | POA: Diagnosis not present

## 2016-11-29 DIAGNOSIS — Z7982 Long term (current) use of aspirin: Secondary | ICD-10-CM | POA: Diagnosis not present

## 2016-11-29 DIAGNOSIS — Z794 Long term (current) use of insulin: Secondary | ICD-10-CM | POA: Diagnosis not present

## 2016-12-02 ENCOUNTER — Emergency Department (HOSPITAL_COMMUNITY): Payer: PPO

## 2016-12-02 ENCOUNTER — Emergency Department (HOSPITAL_COMMUNITY)
Admission: EM | Admit: 2016-12-02 | Discharge: 2016-12-02 | Disposition: A | Discharge status: Home / Self Care | Payer: PPO | Attending: Emergency Medicine | Admitting: Emergency Medicine

## 2016-12-02 ENCOUNTER — Encounter (HOSPITAL_COMMUNITY): Payer: Self-pay | Admitting: Emergency Medicine

## 2016-12-02 DIAGNOSIS — S0081XA Abrasion of other part of head, initial encounter: Secondary | ICD-10-CM | POA: Insufficient documentation

## 2016-12-02 DIAGNOSIS — Y999 Unspecified external cause status: Secondary | ICD-10-CM | POA: Insufficient documentation

## 2016-12-02 DIAGNOSIS — I2510 Atherosclerotic heart disease of native coronary artery without angina pectoris: Secondary | ICD-10-CM | POA: Diagnosis not present

## 2016-12-02 DIAGNOSIS — S0990XA Unspecified injury of head, initial encounter: Secondary | ICD-10-CM

## 2016-12-02 DIAGNOSIS — Y9301 Activity, walking, marching and hiking: Secondary | ICD-10-CM | POA: Insufficient documentation

## 2016-12-02 DIAGNOSIS — I10 Essential (primary) hypertension: Secondary | ICD-10-CM | POA: Insufficient documentation

## 2016-12-02 DIAGNOSIS — Z7901 Long term (current) use of anticoagulants: Secondary | ICD-10-CM | POA: Diagnosis not present

## 2016-12-02 DIAGNOSIS — S0091XA Abrasion of unspecified part of head, initial encounter: Secondary | ICD-10-CM | POA: Diagnosis not present

## 2016-12-02 DIAGNOSIS — W01198A Fall on same level from slipping, tripping and stumbling with subsequent striking against other object, initial encounter: Secondary | ICD-10-CM | POA: Insufficient documentation

## 2016-12-02 DIAGNOSIS — Y92194 Driveway of other specified residential institution as the place of occurrence of the external cause: Secondary | ICD-10-CM | POA: Diagnosis not present

## 2016-12-02 DIAGNOSIS — Y92009 Unspecified place in unspecified non-institutional (private) residence as the place of occurrence of the external cause: Secondary | ICD-10-CM

## 2016-12-02 DIAGNOSIS — I119 Hypertensive heart disease without heart failure: Secondary | ICD-10-CM | POA: Diagnosis not present

## 2016-12-02 DIAGNOSIS — Z79899 Other long term (current) drug therapy: Secondary | ICD-10-CM | POA: Diagnosis not present

## 2016-12-02 DIAGNOSIS — E109 Type 1 diabetes mellitus without complications: Secondary | ICD-10-CM | POA: Insufficient documentation

## 2016-12-02 DIAGNOSIS — Z794 Long term (current) use of insulin: Secondary | ICD-10-CM | POA: Diagnosis not present

## 2016-12-02 DIAGNOSIS — W19XXXA Unspecified fall, initial encounter: Secondary | ICD-10-CM

## 2016-12-02 NOTE — Discharge Instructions (Signed)
As discussed, to her CT scan did not show any bleed today. You need to monitor for any worsening in condition including confusion, change in vision, headache, dizziness, nausea, vomiting and return to the emergency department immediately if you experience any new concerning symptoms. Follow-up with your primary care provider.

## 2016-12-02 NOTE — ED Triage Notes (Signed)
[facility] after mechanical fall with walker. Pt takes Eliquis, was walking outside in driveway and tripped on unlevel cement. Pt has two abrasions to forehead and one to R knee. Denies LOC, dizziness or n/v. VSS, CBG 125. L BKA 2 years ago

## 2016-12-02 NOTE — ED Notes (Signed)
Pt was able to ambulate a few steps before getting SOB. He stated that his physician stated that he would be SOB upon exertion for awhile after a procedure. He was SOB upon exertion prior to the fall today. He was able to stand with 2 person mod assist. Once standing he was able to ambulate with a walker on his own. Pt stated that it is difficult to get up at home as well and that this is not new.

## 2016-12-02 NOTE — ED Notes (Signed)
Signature pad not working, pt and visitors verbalized understanding of discharge instructions.

## 2016-12-02 NOTE — ED Provider Notes (Signed)
[facility]-EMERGENCY DEPT Provider Note   CSN: [id number] Arrival date & time: 12/02/16  1727     History   Chief Complaint Chief Complaint  Patient presents with  . Fall  . Head Injury    HPI Samuel [name] is a 66 y.o. male presenting via EMS after trip and fall over a dip in the driveway while walking with his walker just prior to arrival. He has been started on Eliquis last Wednesday for blood clots in his lungs. He has an abrasion to his forehead and right knee but otherwise states that he feels fine. He denies LOC, fainting, dizziness, nausea, vomiting, visual disturbances or other symptoms.  HPI  Past Medical History:  Diagnosis Date  . Anxiety   . Arthritis    "all over"   . CAD (coronary artery disease)   . Charcot's joint    "left foot"  . 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 Depression   . Gastroparesis   . GERD (gastroesophageal reflux disease)   . H/O hiatal hernia   . Hyperlipidemia   . Hypertension   . OSA (obstructive sleep apnea)    "not bad enough for a mask"  . Peripheral neuropathy   . Peripheral vascular disease (HCC)   . PONV (postoperative nausea and vomiting)   . Pulmonary embolism (HCC)    hx. of 2012  . Shortness of breath    exertion  . Type II diabetes mellitus [facility])     Patient Active Problem List   Diagnosis Date Noted  . Osteomyelitis of foot (HCC)   . Protein-calorie malnutrition, severe (HCC) 03/08/2015  . Osteomyelitis of foot, acute (HCC) 03/07/2015  . Sepsis (HCC) 03/07/2015  . Diabetes mellitus type 2, uncontrolled (HCC) 03/07/2015  . Normocytic normochromic anemia 03/07/2015  . Obesity (BMI 30-39.9) 03/07/2015  . Nausea & vomiting   . DM (diabetes mellitus), type 2 (HCC) 01/07/2014  . Chronic osteomyelitis involving lower leg (HCC) 01/07/2014  . Vomiting 12/22/2013  . Gastroparesis 12/21/2013  . Intractable nausea and vomiting 12/21/2013  . Coronary artery disease 05/01/2013  . Essential hypertension  05/01/2013  . Hyperlipidemia 05/01/2013  . Generalized weakness 03/20/2013  . Back pain 03/20/2013  . Pulmonary embolism (HCC) 03/27/2011  . PERFORATION OF GALLBLADDER 08/30/2010  . Ulcer of lower limb, unspecified 03/01/2010  . METHICILLIN SUSCEPTIBLE STAPH AUREUS SEPTICEMIA 01/25/2010  . Acute osteomyelitis, ankle and foot 01/25/2010  . NAUSEA AND VOMITING 01/25/2010  . GERD 11/09/2009  . DIABETES, TYPE 1 05/14/2008  . OBSTRUCTIVE SLEEP APNEA 04/30/2008  . ALLERGIC RHINITIS, SEASONAL 04/30/2008    Past Surgical History:  Procedure Laterality Date  . AMPUTATION Left 03/08/2015   Procedure:  LEFT AMPUTATION BELOW KNEE;  Surgeon: Toni Arthurs, MD;  Location: [facility];  Service: Orthopedics;  Laterality: Left;  . APPLICATION OF WOUND VAC Left 01/07/2014  . CHOLECYSTECTOMY  06/2010  . CORONARY ANGIOPLASTY WITH STENT PLACEMENT  05/2009   "1"  . FOOT SURGERY Left 2010   "for Charcot's joint"  . HARDWARE REMOVAL Left 01/07/2014   Procedure: LEFT LEG REMOVAL OF DEEP IMPLANT AND SEQUESTRECTOMY; APPLICATION OF WOUND VAC ;  Surgeon: Toni Arthurs, MD;  Location: [facility] OR;  Service: Orthopedics;  Laterality: Left;  . I&D EXTREMITY Left "multiple"   leg  . 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 IM NAILING TIBIA  Left ~ 2012  . SHOULDER ARTHROSCOPY W/ ROTATOR CUFF REPAIR Left    "and bone spurs"  . TIBIAL IM ROD REMOVAL Left 01/07/2014  . TOE AMPUTATION Right ~ 2011   "great toe"  . VENA CAVA FILTER PLACEMENT  2012  . VENA CAVA FILTER PLACEMENT N/A 11/20/2016   Procedure: INSERTION VENA-CAVA FILTER;  Surgeon: Sherren KernsFields, Charles E, MD;  Location[facility] OR;  Service: Vascular;  Laterality: N/A;  . WOUND DEBRIDEMENT Left 01/07/2014   "tibia"       Home Medications    Prior to Admission medications   Medication Sig Start Date End Date Taking? Authorizing Provider   acetaminophen (TYLENOL) 325 MG tablet Take 650 mg by mouth every 6 (six) hours as needed for mild pain.    [provider]  amLODipine (NORVASC) 10 MG tablet Take 1 tablet (10 mg total) by mouth daily. 11/22/16   Richarda OverlieAbrol, Nayana, MD  apixaban (ELIQUIS) 5 MG TABS tablet Take 2 tablets (10 mg total) by mouth 2 (two) times daily. 11/21/16 11/28/16  Richarda OverlieAbrol, Nayana, MD  apixaban (ELIQUIS) 5 MG TABS tablet Take 1 tablet (5 mg total) by mouth 2 (two) times daily. 11/29/16 11/29/17  Richarda OverlieAbrol, Nayana, MD  Canagliflozin (INVOKANA) 100 MG TABS Take 100 mg by mouth See admin instructions. invokana 100 mg daily. Alternating every other week between Jardiance and Invokana    [provider]  diphenhydramine-acetaminophen (TYLENOL PM) 25-500 MG TABS tablet Take 1 tablet by mouth at bedtime.    [provider]  docusate sodium (COLACE) 100 MG capsule Take 100 mg by mouth daily as needed (for constipation).     [provider]  empagliflozin (JARDIANCE) 10 MG TABS tablet Take 10 mg by mouth See admin instructions. Jardiance 10 mg daily.  Alternating every other week between Jardiance and Invokana    [provider]  ferrous sulfate 325 (65 FE) MG tablet Take 325 mg by mouth every evening.     [provider]  gabapentin (NEURONTIN) 300 MG capsule Take 300 mg by mouth 3 (three) times daily.    [provider]  insulin detemir (LEVEMIR) 100 UNIT/ML injection Inject 85 Units into the skin 2 (two) times daily.     [provider]  insulin lispro (HUMALOG) 100 UNIT/ML injection Inject 0-10 Units into the skin 3 (three) times daily with meals. Inject SQ per sliding scale 150-250 (5 units), 251-350(8 units), 351-450(10 units), >450=10 units    [provider]  metoCLOPramide (REGLAN) 10 MG tablet Take 10 mg by mouth at bedtime.     [provider]  omeprazole (PRILOSEC) 20 MG capsule Take 20 mg by mouth daily.     [provider]    pantoprazole (PROTONIX) 40 MG tablet Take 40 mg by mouth 2 (two) times daily as needed (for heartburn/indigestion).     [provider]  pioglitazone (ACTOS) 30 MG tablet Take 30 mg by mouth at bedtime.     [provider]  polyethylene glycol (MIRALAX / GLYCOLAX) packet Take 17 g by mouth daily. Patient taking differently: Take 17 g by mouth daily as needed for moderate constipation.  03/10/15   Hollice EspyKrishnan, Sendil K, MD  promethazine (PHENERGAN) 25 MG suppository Place 25 mg rectally every 6 (six) hours as needed for nausea or vomiting.    [provider]  promethazine (PHENERGAN) 25 MG tablet Take 25 mg by mouth every 8 (eight) hours as needed for nausea or vomiting. Take one tablet by mouth every  8 hours as needed nausea/vomiting    [provider]  ranitidine (ZANTAC) 150 MG tablet Take 150 mg by mouth at bedtime.     [provider]  simvastatin (ZOCOR) 40 MG tablet Take 60 mg by mouth daily at 6 PM. Take one and half tablet by mouth once daily in the evening for cholesterol    [provider]  traMADol (ULTRAM) 50 MG tablet Take 100 mg by mouth every 12 (twelve) hours as needed (pain).    [provider]    Family History Family History  Problem Relation Age of Onset  . COPD Mother   . Heart disease Mother   . Lung cancer Mother   . Retinal detachment Father   . Alcoholism Brother   . Heart disease Brother   . COPD Brother   . Diabetes Brother   . Hypertension Brother   . Stroke Brother     Social History Social History  Substance Use Topics  . Smoking status: Never Smoker  . Smokeless tobacco: Never Used  . Alcohol use Yes     Comment: 01/07/2014 'might have a wine cooler a couple times/yr"     Allergies   Codeine; Gabapentin; and Nabumetone   Review of Systems Review of Systems  Constitutional: Negative for chills and fever.  HENT: Negative for ear discharge, ear pain, facial swelling, sore throat and  trouble swallowing.   Eyes: Negative for photophobia, pain, redness and visual disturbance.  Respiratory: Negative for cough, shortness of breath, wheezing and stridor.   Cardiovascular: Negative for chest pain, palpitations and leg swelling.  Gastrointestinal: Negative for abdominal distention, abdominal pain, nausea and vomiting.  Genitourinary: Negative for dysuria and hematuria.  Musculoskeletal: Negative for arthralgias, back pain, gait problem, joint swelling, myalgias, neck pain and neck stiffness.  Skin: Positive for wound. Negative for color change and pallor.  Neurological: Negative for dizziness, tremors, seizures, syncope, facial asymmetry, speech difficulty, weakness, light-headedness, numbness and headaches.     Physical Exam Updated Vital Signs BP 119/74   Pulse 86   Ht 6\' 3"  (1.905 m)   Wt 129.3 kg (285 lb)   SpO2 99%   BMI 35.62 kg/m   Physical Exam  Constitutional: He is oriented to person, place, and time. He appears well-developed and well-nourished. No distress.  Patient is lying comfortably in bed in no acute distress and has no complaints and reports being comfortable.  HENT:  Head: Normocephalic and atraumatic.  Mouth/Throat: Oropharynx is clear and moist. No oropharyngeal exudate.  Eyes: Conjunctivae and EOM are normal. Pupils are equal, round, and reactive to light. Right eye exhibits no discharge. Left eye exhibits no discharge.  Neck: Normal range of motion. Neck supple.  Cardiovascular: Normal rate, regular rhythm, normal heart sounds and intact distal pulses.   No murmur heard. Pulmonary/Chest: Effort normal and breath sounds normal. No stridor. No respiratory distress. He has no wheezes. He has no rales.  Abdominal: Soft. He exhibits no distension and no mass. There is no tenderness. There is no rebound and no guarding.  Musculoskeletal: Normal range of motion. He exhibits no edema, tenderness or deformity.  Left below knee amputation with  prosthesis. 5 out of 5 strength at hip flexion bilaterally. 5 out of 5 strength in knee flexion and extension plantar flexion dorsiflexion.  No midline tenderness palpation of entire spine.  Neurological: He is alert and oriented to person, place, and time. No cranial nerve deficit or sensory deficit. He exhibits normal muscle tone. Coordination  normal.  Neurologic Exam:   - Mental status: Patient is alert and cooperative. Fluent speech and words are clear. Coherent thought processes and insight is good. Patient is oriented x 4 to person, place, time and event.  - Cranial nerves:  CN III, IV, VI: pupils equally round, reactive to light both direct and conscensual and normal accommodation. Full extra-ocular movement. CN V: motor temporalis and masseter strength intact. CN VII : muscles of facial expression intact. CN X :  midline uvula. XI strength of sternocleidomastoid and trapezius muscles 5/5, XII: tongue is midline when protruded. - Motor: No involuntary movements. Muscle tone and bulk normal throughout. Muscle strength is 5/5 in bilateral shoulder abduction, elbow flexion and extension, grip, hip flexion, 5/5 right leg flexion and extension, ankle dorsiflexion and plantar flexion.  - Sensory: Proprioception, light tough sensation intact in upper extremities bilaterally and right lower extremity. - Cerebellar: rapid alternating movements and point to point movement intact in upper and lower extremities. Normal stance and gait.  Skin: Skin is warm and dry. He is not diaphoretic. No pallor.  Approximately 4 cm abrasion to the middle of his forehead and 2 cm x 2 cm abrasion of the right knee.  Psychiatric: He has a normal mood and affect.  Nursing note and vitals reviewed.    ED Treatments / Results  Labs (all labs ordered are listed, but only abnormal results are displayed) Labs Reviewed - No data to display  EKG  EKG Interpretation None       Radiology Ct Head Wo Contrast  Result  Date: 12/02/2016 CLINICAL DATA:  Initial evaluation for acute fall, trauma. EXAM: CT HEAD WITHOUT CONTRAST TECHNIQUE: Contiguous axial images were obtained from the base of the skull through the vertex without intravenous contrast. COMPARISON:  None. FINDINGS: Brain: Mild atrophy with chronic small vessel ischemic disease. The no acute intracranial hemorrhage. No evidence for acute large vessel territory infarct. No mass lesion, midline shift or mass effect. No hydrocephalus. No extra-axial fluid collection. Vascular: No hyperdense vessel. Scattered vascular calcifications noted within the carotid siphons. Skull: Scalp soft tissues within normal limits.  Calvarium intact. Sinuses/Orbits: Globes and oval soft tissues within normal limits. Paranasal sinuses and mastoids are clear. IMPRESSION: 1. No acute intracranial process. 2. Mild age-related cerebral atrophy with chronic small vessel ischemic disease. Electronically Signed   By: Rise Mu M.D.   On: 12/02/2016 18:57    Procedures Procedures (including critical care time)  Medications Ordered in ED Medications - No data to display   Initial Impression / Assessment and Plan / ED Course  I have reviewed the triage vital signs and the nursing notes.  Pertinent labs & imaging results that were available during my care of the patient were reviewed by me and considered in my medical decision making (see chart for details).    Patient presents after a mechanical fall, no loss of consciousness. Does have mild trauma to his forehead with abrasion. Patient is on Eliquis for recent PE.  Ordered CT head without contrast  Reassuring exam overall, normal neuro. Patient has no concerning symptoms.  Patient typically ambulates with a walker will have him ambulate after CT results.  He was stable, lying comfortably in bed no complaints.  CT: without acute intracranial abnormality. Wound care and ambulate patient in the hall prior to  discharge.  Patient ambulated without difficultiesUsing his walker in the hall while in the emergency department. He denied any symptoms dizziness headache when he stood up. Wound dressings were  applied to both abrasions. Discharge home with close follow up and strict return precautions.  Discussed strict return precautions and advised to return to the emergency department if experiencing any new or worsening symptoms. Instructions were understood and patient agreed with discharge plan. Patient was discussed with Dr. Estell Harpin who agrees with assessment and plan.  Final Clinical Impressions(s) / ED Diagnoses   Final diagnoses:  Fall in home, initial encounter  Injury of head, initial encounter    New Prescriptions New Prescriptions   No medications on file     Gregary Cromer 12/02/16 2046    Bethann Berkshire, MD 12/02/16 2313

## 2016-12-03 DIAGNOSIS — K3184 Gastroparesis: Secondary | ICD-10-CM | POA: Diagnosis not present

## 2016-12-03 DIAGNOSIS — M25662 Stiffness of left knee, not elsewhere classified: Secondary | ICD-10-CM | POA: Diagnosis not present

## 2016-12-03 DIAGNOSIS — Z89512 Acquired absence of left leg below knee: Secondary | ICD-10-CM | POA: Diagnosis not present

## 2016-12-03 DIAGNOSIS — Z86711 Personal history of pulmonary embolism: Secondary | ICD-10-CM | POA: Diagnosis not present

## 2016-12-03 DIAGNOSIS — Z7982 Long term (current) use of aspirin: Secondary | ICD-10-CM | POA: Diagnosis not present

## 2016-12-03 DIAGNOSIS — Z794 Long term (current) use of insulin: Secondary | ICD-10-CM | POA: Diagnosis not present

## 2016-12-03 DIAGNOSIS — G4733 Obstructive sleep apnea (adult) (pediatric): Secondary | ICD-10-CM | POA: Diagnosis not present

## 2016-12-03 DIAGNOSIS — E11610 Type 2 diabetes mellitus with diabetic neuropathic arthropathy: Secondary | ICD-10-CM | POA: Diagnosis not present

## 2016-12-03 DIAGNOSIS — Z955 Presence of coronary angioplasty implant and graft: Secondary | ICD-10-CM | POA: Diagnosis not present

## 2016-12-03 DIAGNOSIS — I2510 Atherosclerotic heart disease of native coronary artery without angina pectoris: Secondary | ICD-10-CM | POA: Diagnosis not present

## 2016-12-03 DIAGNOSIS — M25562 Pain in left knee: Secondary | ICD-10-CM | POA: Diagnosis not present

## 2016-12-03 DIAGNOSIS — Z8631 Personal history of diabetic foot ulcer: Secondary | ICD-10-CM | POA: Diagnosis not present

## 2016-12-03 DIAGNOSIS — E1143 Type 2 diabetes mellitus with diabetic autonomic (poly)neuropathy: Secondary | ICD-10-CM | POA: Diagnosis not present

## 2016-12-04 DIAGNOSIS — Z86711 Personal history of pulmonary embolism: Secondary | ICD-10-CM | POA: Diagnosis not present

## 2016-12-04 DIAGNOSIS — G4733 Obstructive sleep apnea (adult) (pediatric): Secondary | ICD-10-CM | POA: Diagnosis not present

## 2016-12-04 DIAGNOSIS — Z7982 Long term (current) use of aspirin: Secondary | ICD-10-CM | POA: Diagnosis not present

## 2016-12-04 DIAGNOSIS — Z794 Long term (current) use of insulin: Secondary | ICD-10-CM | POA: Diagnosis not present

## 2016-12-04 DIAGNOSIS — E11610 Type 2 diabetes mellitus with diabetic neuropathic arthropathy: Secondary | ICD-10-CM | POA: Diagnosis not present

## 2016-12-04 DIAGNOSIS — K3184 Gastroparesis: Secondary | ICD-10-CM | POA: Diagnosis not present

## 2016-12-04 DIAGNOSIS — Z89512 Acquired absence of left leg below knee: Secondary | ICD-10-CM | POA: Diagnosis not present

## 2016-12-04 DIAGNOSIS — E1143 Type 2 diabetes mellitus with diabetic autonomic (poly)neuropathy: Secondary | ICD-10-CM | POA: Diagnosis not present

## 2016-12-04 DIAGNOSIS — I2510 Atherosclerotic heart disease of native coronary artery without angina pectoris: Secondary | ICD-10-CM | POA: Diagnosis not present

## 2016-12-04 DIAGNOSIS — Z8631 Personal history of diabetic foot ulcer: Secondary | ICD-10-CM | POA: Diagnosis not present

## 2016-12-04 DIAGNOSIS — Z955 Presence of coronary angioplasty implant and graft: Secondary | ICD-10-CM | POA: Diagnosis not present

## 2016-12-04 DIAGNOSIS — M25562 Pain in left knee: Secondary | ICD-10-CM | POA: Diagnosis not present

## 2016-12-04 DIAGNOSIS — M25662 Stiffness of left knee, not elsewhere classified: Secondary | ICD-10-CM | POA: Diagnosis not present

## 2016-12-11 DIAGNOSIS — M25562 Pain in left knee: Secondary | ICD-10-CM | POA: Diagnosis not present

## 2016-12-11 DIAGNOSIS — G4733 Obstructive sleep apnea (adult) (pediatric): Secondary | ICD-10-CM | POA: Diagnosis not present

## 2016-12-11 DIAGNOSIS — E1143 Type 2 diabetes mellitus with diabetic autonomic (poly)neuropathy: Secondary | ICD-10-CM | POA: Diagnosis not present

## 2016-12-11 DIAGNOSIS — Z89512 Acquired absence of left leg below knee: Secondary | ICD-10-CM | POA: Diagnosis not present

## 2016-12-11 DIAGNOSIS — M25662 Stiffness of left knee, not elsewhere classified: Secondary | ICD-10-CM | POA: Diagnosis not present

## 2016-12-11 DIAGNOSIS — K3184 Gastroparesis: Secondary | ICD-10-CM | POA: Diagnosis not present

## 2016-12-11 DIAGNOSIS — Z86711 Personal history of pulmonary embolism: Secondary | ICD-10-CM | POA: Diagnosis not present

## 2016-12-11 DIAGNOSIS — E11610 Type 2 diabetes mellitus with diabetic neuropathic arthropathy: Secondary | ICD-10-CM | POA: Diagnosis not present

## 2016-12-11 DIAGNOSIS — Z794 Long term (current) use of insulin: Secondary | ICD-10-CM | POA: Diagnosis not present

## 2016-12-11 DIAGNOSIS — Z8631 Personal history of diabetic foot ulcer: Secondary | ICD-10-CM | POA: Diagnosis not present

## 2016-12-11 DIAGNOSIS — Z7982 Long term (current) use of aspirin: Secondary | ICD-10-CM | POA: Diagnosis not present

## 2016-12-11 DIAGNOSIS — Z955 Presence of coronary angioplasty implant and graft: Secondary | ICD-10-CM | POA: Diagnosis not present

## 2016-12-11 DIAGNOSIS — I2510 Atherosclerotic heart disease of native coronary artery without angina pectoris: Secondary | ICD-10-CM | POA: Diagnosis not present

## 2016-12-18 DIAGNOSIS — Z89512 Acquired absence of left leg below knee: Secondary | ICD-10-CM | POA: Diagnosis not present

## 2016-12-18 DIAGNOSIS — E11610 Type 2 diabetes mellitus with diabetic neuropathic arthropathy: Secondary | ICD-10-CM | POA: Diagnosis not present

## 2016-12-18 DIAGNOSIS — Z86711 Personal history of pulmonary embolism: Secondary | ICD-10-CM | POA: Diagnosis not present

## 2016-12-18 DIAGNOSIS — G4733 Obstructive sleep apnea (adult) (pediatric): Secondary | ICD-10-CM | POA: Diagnosis not present

## 2016-12-18 DIAGNOSIS — Z8631 Personal history of diabetic foot ulcer: Secondary | ICD-10-CM | POA: Diagnosis not present

## 2016-12-18 DIAGNOSIS — Z794 Long term (current) use of insulin: Secondary | ICD-10-CM | POA: Diagnosis not present

## 2016-12-18 DIAGNOSIS — Z9714 Presence of artificial left leg (complete) (partial): Secondary | ICD-10-CM | POA: Diagnosis not present

## 2016-12-18 DIAGNOSIS — Z89411 Acquired absence of right great toe: Secondary | ICD-10-CM | POA: Diagnosis not present

## 2016-12-18 DIAGNOSIS — I2510 Atherosclerotic heart disease of native coronary artery without angina pectoris: Secondary | ICD-10-CM | POA: Diagnosis not present

## 2016-12-18 DIAGNOSIS — Z955 Presence of coronary angioplasty implant and graft: Secondary | ICD-10-CM | POA: Diagnosis not present

## 2016-12-18 DIAGNOSIS — Z7982 Long term (current) use of aspirin: Secondary | ICD-10-CM | POA: Diagnosis not present

## 2016-12-18 DIAGNOSIS — E1143 Type 2 diabetes mellitus with diabetic autonomic (poly)neuropathy: Secondary | ICD-10-CM | POA: Diagnosis not present

## 2016-12-18 DIAGNOSIS — M25662 Stiffness of left knee, not elsewhere classified: Secondary | ICD-10-CM | POA: Diagnosis not present

## 2016-12-18 DIAGNOSIS — M25562 Pain in left knee: Secondary | ICD-10-CM | POA: Diagnosis not present

## 2016-12-18 DIAGNOSIS — K3184 Gastroparesis: Secondary | ICD-10-CM | POA: Diagnosis not present

## 2016-12-20 DIAGNOSIS — E1165 Type 2 diabetes mellitus with hyperglycemia: Secondary | ICD-10-CM | POA: Diagnosis not present

## 2016-12-20 DIAGNOSIS — I10 Essential (primary) hypertension: Secondary | ICD-10-CM | POA: Diagnosis not present

## 2016-12-20 DIAGNOSIS — Z5181 Encounter for therapeutic drug level monitoring: Secondary | ICD-10-CM | POA: Diagnosis not present

## 2016-12-20 DIAGNOSIS — Z79899 Other long term (current) drug therapy: Secondary | ICD-10-CM | POA: Diagnosis not present

## 2016-12-25 DIAGNOSIS — L98499 Non-pressure chronic ulcer of skin of other sites with unspecified severity: Secondary | ICD-10-CM | POA: Diagnosis not present

## 2016-12-25 DIAGNOSIS — Z8631 Personal history of diabetic foot ulcer: Secondary | ICD-10-CM | POA: Diagnosis not present

## 2016-12-25 DIAGNOSIS — Z89411 Acquired absence of right great toe: Secondary | ICD-10-CM | POA: Diagnosis not present

## 2016-12-25 DIAGNOSIS — Z955 Presence of coronary angioplasty implant and graft: Secondary | ICD-10-CM | POA: Diagnosis not present

## 2016-12-25 DIAGNOSIS — I10 Essential (primary) hypertension: Secondary | ICD-10-CM | POA: Diagnosis not present

## 2016-12-25 DIAGNOSIS — M25662 Stiffness of left knee, not elsewhere classified: Secondary | ICD-10-CM | POA: Diagnosis not present

## 2016-12-25 DIAGNOSIS — I2510 Atherosclerotic heart disease of native coronary artery without angina pectoris: Secondary | ICD-10-CM | POA: Diagnosis not present

## 2016-12-25 DIAGNOSIS — E1143 Type 2 diabetes mellitus with diabetic autonomic (poly)neuropathy: Secondary | ICD-10-CM | POA: Diagnosis not present

## 2016-12-25 DIAGNOSIS — E7800 Pure hypercholesterolemia, unspecified: Secondary | ICD-10-CM | POA: Diagnosis not present

## 2016-12-25 DIAGNOSIS — K3184 Gastroparesis: Secondary | ICD-10-CM | POA: Diagnosis not present

## 2016-12-25 DIAGNOSIS — Z9714 Presence of artificial left leg (complete) (partial): Secondary | ICD-10-CM | POA: Diagnosis not present

## 2016-12-25 DIAGNOSIS — E11610 Type 2 diabetes mellitus with diabetic neuropathic arthropathy: Secondary | ICD-10-CM | POA: Diagnosis not present

## 2016-12-25 DIAGNOSIS — Z86711 Personal history of pulmonary embolism: Secondary | ICD-10-CM | POA: Diagnosis not present

## 2016-12-25 DIAGNOSIS — G4733 Obstructive sleep apnea (adult) (pediatric): Secondary | ICD-10-CM | POA: Diagnosis not present

## 2016-12-25 DIAGNOSIS — Z89512 Acquired absence of left leg below knee: Secondary | ICD-10-CM | POA: Diagnosis not present

## 2016-12-25 DIAGNOSIS — M25562 Pain in left knee: Secondary | ICD-10-CM | POA: Diagnosis not present

## 2016-12-25 DIAGNOSIS — Z7982 Long term (current) use of aspirin: Secondary | ICD-10-CM | POA: Diagnosis not present

## 2016-12-25 DIAGNOSIS — Z794 Long term (current) use of insulin: Secondary | ICD-10-CM | POA: Diagnosis not present

## 2016-12-25 DIAGNOSIS — E1165 Type 2 diabetes mellitus with hyperglycemia: Secondary | ICD-10-CM | POA: Diagnosis not present

## 2016-12-26 DIAGNOSIS — G4733 Obstructive sleep apnea (adult) (pediatric): Secondary | ICD-10-CM | POA: Diagnosis not present

## 2016-12-26 DIAGNOSIS — Z7982 Long term (current) use of aspirin: Secondary | ICD-10-CM | POA: Diagnosis not present

## 2016-12-26 DIAGNOSIS — Z89512 Acquired absence of left leg below knee: Secondary | ICD-10-CM | POA: Diagnosis not present

## 2016-12-26 DIAGNOSIS — Z89411 Acquired absence of right great toe: Secondary | ICD-10-CM | POA: Diagnosis not present

## 2016-12-26 DIAGNOSIS — E11610 Type 2 diabetes mellitus with diabetic neuropathic arthropathy: Secondary | ICD-10-CM | POA: Diagnosis not present

## 2016-12-26 DIAGNOSIS — Z8631 Personal history of diabetic foot ulcer: Secondary | ICD-10-CM | POA: Diagnosis not present

## 2016-12-26 DIAGNOSIS — Z9714 Presence of artificial left leg (complete) (partial): Secondary | ICD-10-CM | POA: Diagnosis not present

## 2016-12-26 DIAGNOSIS — I2510 Atherosclerotic heart disease of native coronary artery without angina pectoris: Secondary | ICD-10-CM | POA: Diagnosis not present

## 2016-12-26 DIAGNOSIS — Z794 Long term (current) use of insulin: Secondary | ICD-10-CM | POA: Diagnosis not present

## 2016-12-26 DIAGNOSIS — Z955 Presence of coronary angioplasty implant and graft: Secondary | ICD-10-CM | POA: Diagnosis not present

## 2016-12-26 DIAGNOSIS — M25662 Stiffness of left knee, not elsewhere classified: Secondary | ICD-10-CM | POA: Diagnosis not present

## 2016-12-26 DIAGNOSIS — K3184 Gastroparesis: Secondary | ICD-10-CM | POA: Diagnosis not present

## 2016-12-26 DIAGNOSIS — Z86711 Personal history of pulmonary embolism: Secondary | ICD-10-CM | POA: Diagnosis not present

## 2016-12-26 DIAGNOSIS — E1143 Type 2 diabetes mellitus with diabetic autonomic (poly)neuropathy: Secondary | ICD-10-CM | POA: Diagnosis not present

## 2016-12-26 DIAGNOSIS — M25562 Pain in left knee: Secondary | ICD-10-CM | POA: Diagnosis not present

## 2017-01-02 DIAGNOSIS — K3184 Gastroparesis: Secondary | ICD-10-CM | POA: Diagnosis not present

## 2017-01-02 DIAGNOSIS — Z8631 Personal history of diabetic foot ulcer: Secondary | ICD-10-CM | POA: Diagnosis not present

## 2017-01-02 DIAGNOSIS — Z86711 Personal history of pulmonary embolism: Secondary | ICD-10-CM | POA: Diagnosis not present

## 2017-01-02 DIAGNOSIS — M25562 Pain in left knee: Secondary | ICD-10-CM | POA: Diagnosis not present

## 2017-01-02 DIAGNOSIS — E1143 Type 2 diabetes mellitus with diabetic autonomic (poly)neuropathy: Secondary | ICD-10-CM | POA: Diagnosis not present

## 2017-01-02 DIAGNOSIS — Z89411 Acquired absence of right great toe: Secondary | ICD-10-CM | POA: Diagnosis not present

## 2017-01-02 DIAGNOSIS — Z89512 Acquired absence of left leg below knee: Secondary | ICD-10-CM | POA: Diagnosis not present

## 2017-01-02 DIAGNOSIS — G4733 Obstructive sleep apnea (adult) (pediatric): Secondary | ICD-10-CM | POA: Diagnosis not present

## 2017-01-02 DIAGNOSIS — Z9714 Presence of artificial left leg (complete) (partial): Secondary | ICD-10-CM | POA: Diagnosis not present

## 2017-01-02 DIAGNOSIS — E118 Type 2 diabetes mellitus with unspecified complications: Secondary | ICD-10-CM | POA: Diagnosis not present

## 2017-01-02 DIAGNOSIS — Z794 Long term (current) use of insulin: Secondary | ICD-10-CM | POA: Diagnosis not present

## 2017-01-02 DIAGNOSIS — E11610 Type 2 diabetes mellitus with diabetic neuropathic arthropathy: Secondary | ICD-10-CM | POA: Diagnosis not present

## 2017-01-02 DIAGNOSIS — I2510 Atherosclerotic heart disease of native coronary artery without angina pectoris: Secondary | ICD-10-CM | POA: Diagnosis not present

## 2017-01-02 DIAGNOSIS — R2681 Unsteadiness on feet: Secondary | ICD-10-CM | POA: Diagnosis not present

## 2017-01-02 DIAGNOSIS — Z955 Presence of coronary angioplasty implant and graft: Secondary | ICD-10-CM | POA: Diagnosis not present

## 2017-01-02 DIAGNOSIS — L98499 Non-pressure chronic ulcer of skin of other sites with unspecified severity: Secondary | ICD-10-CM | POA: Diagnosis not present

## 2017-01-02 DIAGNOSIS — M25662 Stiffness of left knee, not elsewhere classified: Secondary | ICD-10-CM | POA: Diagnosis not present

## 2017-01-02 DIAGNOSIS — Z7982 Long term (current) use of aspirin: Secondary | ICD-10-CM | POA: Diagnosis not present

## 2017-01-09 DIAGNOSIS — K3184 Gastroparesis: Secondary | ICD-10-CM | POA: Diagnosis not present

## 2017-01-09 DIAGNOSIS — E1143 Type 2 diabetes mellitus with diabetic autonomic (poly)neuropathy: Secondary | ICD-10-CM | POA: Diagnosis not present

## 2017-01-09 DIAGNOSIS — M25662 Stiffness of left knee, not elsewhere classified: Secondary | ICD-10-CM | POA: Diagnosis not present

## 2017-01-09 DIAGNOSIS — G4733 Obstructive sleep apnea (adult) (pediatric): Secondary | ICD-10-CM | POA: Diagnosis not present

## 2017-01-09 DIAGNOSIS — Z794 Long term (current) use of insulin: Secondary | ICD-10-CM | POA: Diagnosis not present

## 2017-01-09 DIAGNOSIS — M25562 Pain in left knee: Secondary | ICD-10-CM | POA: Diagnosis not present

## 2017-01-09 DIAGNOSIS — Z955 Presence of coronary angioplasty implant and graft: Secondary | ICD-10-CM | POA: Diagnosis not present

## 2017-01-09 DIAGNOSIS — Z86711 Personal history of pulmonary embolism: Secondary | ICD-10-CM | POA: Diagnosis not present

## 2017-01-09 DIAGNOSIS — Z9714 Presence of artificial left leg (complete) (partial): Secondary | ICD-10-CM | POA: Diagnosis not present

## 2017-01-09 DIAGNOSIS — Z7982 Long term (current) use of aspirin: Secondary | ICD-10-CM | POA: Diagnosis not present

## 2017-01-09 DIAGNOSIS — Z89411 Acquired absence of right great toe: Secondary | ICD-10-CM | POA: Diagnosis not present

## 2017-01-09 DIAGNOSIS — E11610 Type 2 diabetes mellitus with diabetic neuropathic arthropathy: Secondary | ICD-10-CM | POA: Diagnosis not present

## 2017-01-09 DIAGNOSIS — I2510 Atherosclerotic heart disease of native coronary artery without angina pectoris: Secondary | ICD-10-CM | POA: Diagnosis not present

## 2017-01-09 DIAGNOSIS — Z89512 Acquired absence of left leg below knee: Secondary | ICD-10-CM | POA: Diagnosis not present

## 2017-01-09 DIAGNOSIS — Z8631 Personal history of diabetic foot ulcer: Secondary | ICD-10-CM | POA: Diagnosis not present

## 2017-01-10 DIAGNOSIS — K3184 Gastroparesis: Secondary | ICD-10-CM | POA: Diagnosis not present

## 2017-01-10 DIAGNOSIS — Z9714 Presence of artificial left leg (complete) (partial): Secondary | ICD-10-CM | POA: Diagnosis not present

## 2017-01-10 DIAGNOSIS — G4733 Obstructive sleep apnea (adult) (pediatric): Secondary | ICD-10-CM | POA: Diagnosis not present

## 2017-01-10 DIAGNOSIS — M25562 Pain in left knee: Secondary | ICD-10-CM | POA: Diagnosis not present

## 2017-01-10 DIAGNOSIS — M25662 Stiffness of left knee, not elsewhere classified: Secondary | ICD-10-CM | POA: Diagnosis not present

## 2017-01-10 DIAGNOSIS — Z89411 Acquired absence of right great toe: Secondary | ICD-10-CM | POA: Diagnosis not present

## 2017-01-10 DIAGNOSIS — E11610 Type 2 diabetes mellitus with diabetic neuropathic arthropathy: Secondary | ICD-10-CM | POA: Diagnosis not present

## 2017-01-10 DIAGNOSIS — Z794 Long term (current) use of insulin: Secondary | ICD-10-CM | POA: Diagnosis not present

## 2017-01-10 DIAGNOSIS — Z86711 Personal history of pulmonary embolism: Secondary | ICD-10-CM | POA: Diagnosis not present

## 2017-01-10 DIAGNOSIS — Z89512 Acquired absence of left leg below knee: Secondary | ICD-10-CM | POA: Diagnosis not present

## 2017-01-10 DIAGNOSIS — Z7982 Long term (current) use of aspirin: Secondary | ICD-10-CM | POA: Diagnosis not present

## 2017-01-10 DIAGNOSIS — E1143 Type 2 diabetes mellitus with diabetic autonomic (poly)neuropathy: Secondary | ICD-10-CM | POA: Diagnosis not present

## 2017-01-10 DIAGNOSIS — Z955 Presence of coronary angioplasty implant and graft: Secondary | ICD-10-CM | POA: Diagnosis not present

## 2017-01-10 DIAGNOSIS — I2510 Atherosclerotic heart disease of native coronary artery without angina pectoris: Secondary | ICD-10-CM | POA: Diagnosis not present

## 2017-01-10 DIAGNOSIS — Z8631 Personal history of diabetic foot ulcer: Secondary | ICD-10-CM | POA: Diagnosis not present

## 2017-01-15 DIAGNOSIS — I2510 Atherosclerotic heart disease of native coronary artery without angina pectoris: Secondary | ICD-10-CM | POA: Diagnosis not present

## 2017-01-15 DIAGNOSIS — M25662 Stiffness of left knee, not elsewhere classified: Secondary | ICD-10-CM | POA: Diagnosis not present

## 2017-01-15 DIAGNOSIS — Z8631 Personal history of diabetic foot ulcer: Secondary | ICD-10-CM | POA: Diagnosis not present

## 2017-01-15 DIAGNOSIS — E11610 Type 2 diabetes mellitus with diabetic neuropathic arthropathy: Secondary | ICD-10-CM | POA: Diagnosis not present

## 2017-01-15 DIAGNOSIS — K3184 Gastroparesis: Secondary | ICD-10-CM | POA: Diagnosis not present

## 2017-01-15 DIAGNOSIS — Z89512 Acquired absence of left leg below knee: Secondary | ICD-10-CM | POA: Diagnosis not present

## 2017-01-15 DIAGNOSIS — Z7982 Long term (current) use of aspirin: Secondary | ICD-10-CM | POA: Diagnosis not present

## 2017-01-15 DIAGNOSIS — G4733 Obstructive sleep apnea (adult) (pediatric): Secondary | ICD-10-CM | POA: Diagnosis not present

## 2017-01-15 DIAGNOSIS — E1143 Type 2 diabetes mellitus with diabetic autonomic (poly)neuropathy: Secondary | ICD-10-CM | POA: Diagnosis not present

## 2017-01-15 DIAGNOSIS — M25562 Pain in left knee: Secondary | ICD-10-CM | POA: Diagnosis not present

## 2017-01-15 DIAGNOSIS — Z794 Long term (current) use of insulin: Secondary | ICD-10-CM | POA: Diagnosis not present

## 2017-01-15 DIAGNOSIS — Z89411 Acquired absence of right great toe: Secondary | ICD-10-CM | POA: Diagnosis not present

## 2017-01-15 DIAGNOSIS — Z86711 Personal history of pulmonary embolism: Secondary | ICD-10-CM | POA: Diagnosis not present

## 2017-01-15 DIAGNOSIS — Z9714 Presence of artificial left leg (complete) (partial): Secondary | ICD-10-CM | POA: Diagnosis not present

## 2017-01-15 DIAGNOSIS — Z955 Presence of coronary angioplasty implant and graft: Secondary | ICD-10-CM | POA: Diagnosis not present

## 2017-01-22 DIAGNOSIS — Z7982 Long term (current) use of aspirin: Secondary | ICD-10-CM | POA: Diagnosis not present

## 2017-01-22 DIAGNOSIS — M25662 Stiffness of left knee, not elsewhere classified: Secondary | ICD-10-CM | POA: Diagnosis not present

## 2017-01-22 DIAGNOSIS — M25562 Pain in left knee: Secondary | ICD-10-CM | POA: Diagnosis not present

## 2017-01-22 DIAGNOSIS — Z8631 Personal history of diabetic foot ulcer: Secondary | ICD-10-CM | POA: Diagnosis not present

## 2017-01-22 DIAGNOSIS — I2510 Atherosclerotic heart disease of native coronary artery without angina pectoris: Secondary | ICD-10-CM | POA: Diagnosis not present

## 2017-01-22 DIAGNOSIS — Z955 Presence of coronary angioplasty implant and graft: Secondary | ICD-10-CM | POA: Diagnosis not present

## 2017-01-22 DIAGNOSIS — G4733 Obstructive sleep apnea (adult) (pediatric): Secondary | ICD-10-CM | POA: Diagnosis not present

## 2017-01-22 DIAGNOSIS — E11610 Type 2 diabetes mellitus with diabetic neuropathic arthropathy: Secondary | ICD-10-CM | POA: Diagnosis not present

## 2017-01-22 DIAGNOSIS — Z9714 Presence of artificial left leg (complete) (partial): Secondary | ICD-10-CM | POA: Diagnosis not present

## 2017-01-22 DIAGNOSIS — K3184 Gastroparesis: Secondary | ICD-10-CM | POA: Diagnosis not present

## 2017-01-22 DIAGNOSIS — Z89512 Acquired absence of left leg below knee: Secondary | ICD-10-CM | POA: Diagnosis not present

## 2017-01-22 DIAGNOSIS — E1143 Type 2 diabetes mellitus with diabetic autonomic (poly)neuropathy: Secondary | ICD-10-CM | POA: Diagnosis not present

## 2017-01-22 DIAGNOSIS — Z86711 Personal history of pulmonary embolism: Secondary | ICD-10-CM | POA: Diagnosis not present

## 2017-01-22 DIAGNOSIS — Z89411 Acquired absence of right great toe: Secondary | ICD-10-CM | POA: Diagnosis not present

## 2017-01-22 DIAGNOSIS — Z794 Long term (current) use of insulin: Secondary | ICD-10-CM | POA: Diagnosis not present

## 2017-01-23 DIAGNOSIS — Z7982 Long term (current) use of aspirin: Secondary | ICD-10-CM | POA: Diagnosis not present

## 2017-01-23 DIAGNOSIS — Z8631 Personal history of diabetic foot ulcer: Secondary | ICD-10-CM | POA: Diagnosis not present

## 2017-01-23 DIAGNOSIS — Z89512 Acquired absence of left leg below knee: Secondary | ICD-10-CM | POA: Diagnosis not present

## 2017-01-23 DIAGNOSIS — Z794 Long term (current) use of insulin: Secondary | ICD-10-CM | POA: Diagnosis not present

## 2017-01-23 DIAGNOSIS — Z955 Presence of coronary angioplasty implant and graft: Secondary | ICD-10-CM | POA: Diagnosis not present

## 2017-01-23 DIAGNOSIS — G4733 Obstructive sleep apnea (adult) (pediatric): Secondary | ICD-10-CM | POA: Diagnosis not present

## 2017-01-23 DIAGNOSIS — E1143 Type 2 diabetes mellitus with diabetic autonomic (poly)neuropathy: Secondary | ICD-10-CM | POA: Diagnosis not present

## 2017-01-23 DIAGNOSIS — K3184 Gastroparesis: Secondary | ICD-10-CM | POA: Diagnosis not present

## 2017-01-23 DIAGNOSIS — I2510 Atherosclerotic heart disease of native coronary artery without angina pectoris: Secondary | ICD-10-CM | POA: Diagnosis not present

## 2017-01-23 DIAGNOSIS — M25562 Pain in left knee: Secondary | ICD-10-CM | POA: Diagnosis not present

## 2017-01-23 DIAGNOSIS — E11610 Type 2 diabetes mellitus with diabetic neuropathic arthropathy: Secondary | ICD-10-CM | POA: Diagnosis not present

## 2017-01-23 DIAGNOSIS — Z9714 Presence of artificial left leg (complete) (partial): Secondary | ICD-10-CM | POA: Diagnosis not present

## 2017-01-23 DIAGNOSIS — M25662 Stiffness of left knee, not elsewhere classified: Secondary | ICD-10-CM | POA: Diagnosis not present

## 2017-01-23 DIAGNOSIS — E118 Type 2 diabetes mellitus with unspecified complications: Secondary | ICD-10-CM | POA: Diagnosis not present

## 2017-01-23 DIAGNOSIS — Z89411 Acquired absence of right great toe: Secondary | ICD-10-CM | POA: Diagnosis not present

## 2017-01-23 DIAGNOSIS — Z86711 Personal history of pulmonary embolism: Secondary | ICD-10-CM | POA: Diagnosis not present

## 2017-01-28 DIAGNOSIS — I10 Essential (primary) hypertension: Secondary | ICD-10-CM | POA: Diagnosis not present

## 2017-01-28 DIAGNOSIS — E7800 Pure hypercholesterolemia, unspecified: Secondary | ICD-10-CM | POA: Diagnosis not present

## 2017-01-28 DIAGNOSIS — E1165 Type 2 diabetes mellitus with hyperglycemia: Secondary | ICD-10-CM | POA: Diagnosis not present

## 2017-01-29 DIAGNOSIS — E1143 Type 2 diabetes mellitus with diabetic autonomic (poly)neuropathy: Secondary | ICD-10-CM | POA: Diagnosis not present

## 2017-01-29 DIAGNOSIS — E11610 Type 2 diabetes mellitus with diabetic neuropathic arthropathy: Secondary | ICD-10-CM | POA: Diagnosis not present

## 2017-01-29 DIAGNOSIS — Z9714 Presence of artificial left leg (complete) (partial): Secondary | ICD-10-CM | POA: Diagnosis not present

## 2017-01-29 DIAGNOSIS — E1122 Type 2 diabetes mellitus with diabetic chronic kidney disease: Secondary | ICD-10-CM | POA: Diagnosis not present

## 2017-01-29 DIAGNOSIS — Z7901 Long term (current) use of anticoagulants: Secondary | ICD-10-CM | POA: Diagnosis not present

## 2017-01-29 DIAGNOSIS — I2699 Other pulmonary embolism without acute cor pulmonale: Secondary | ICD-10-CM | POA: Diagnosis not present

## 2017-01-29 DIAGNOSIS — Z955 Presence of coronary angioplasty implant and graft: Secondary | ICD-10-CM | POA: Diagnosis not present

## 2017-01-29 DIAGNOSIS — Z89411 Acquired absence of right great toe: Secondary | ICD-10-CM | POA: Diagnosis not present

## 2017-01-29 DIAGNOSIS — I129 Hypertensive chronic kidney disease with stage 1 through stage 4 chronic kidney disease, or unspecified chronic kidney disease: Secondary | ICD-10-CM | POA: Diagnosis not present

## 2017-01-29 DIAGNOSIS — Z8631 Personal history of diabetic foot ulcer: Secondary | ICD-10-CM | POA: Diagnosis not present

## 2017-01-29 DIAGNOSIS — Z794 Long term (current) use of insulin: Secondary | ICD-10-CM | POA: Diagnosis not present

## 2017-01-29 DIAGNOSIS — I2510 Atherosclerotic heart disease of native coronary artery without angina pectoris: Secondary | ICD-10-CM | POA: Diagnosis not present

## 2017-01-29 DIAGNOSIS — G4733 Obstructive sleep apnea (adult) (pediatric): Secondary | ICD-10-CM | POA: Diagnosis not present

## 2017-01-29 DIAGNOSIS — Z89512 Acquired absence of left leg below knee: Secondary | ICD-10-CM | POA: Diagnosis not present

## 2017-01-29 DIAGNOSIS — N183 Chronic kidney disease, stage 3 (moderate): Secondary | ICD-10-CM | POA: Diagnosis not present

## 2017-01-29 DIAGNOSIS — K3184 Gastroparesis: Secondary | ICD-10-CM | POA: Diagnosis not present

## 2017-01-29 DIAGNOSIS — M25662 Stiffness of left knee, not elsewhere classified: Secondary | ICD-10-CM | POA: Diagnosis not present

## 2017-01-29 DIAGNOSIS — M25562 Pain in left knee: Secondary | ICD-10-CM | POA: Diagnosis not present

## 2017-02-05 DIAGNOSIS — I2510 Atherosclerotic heart disease of native coronary artery without angina pectoris: Secondary | ICD-10-CM | POA: Diagnosis not present

## 2017-02-05 DIAGNOSIS — Z89411 Acquired absence of right great toe: Secondary | ICD-10-CM | POA: Diagnosis not present

## 2017-02-05 DIAGNOSIS — E11610 Type 2 diabetes mellitus with diabetic neuropathic arthropathy: Secondary | ICD-10-CM | POA: Diagnosis not present

## 2017-02-05 DIAGNOSIS — I129 Hypertensive chronic kidney disease with stage 1 through stage 4 chronic kidney disease, or unspecified chronic kidney disease: Secondary | ICD-10-CM | POA: Diagnosis not present

## 2017-02-05 DIAGNOSIS — Z8631 Personal history of diabetic foot ulcer: Secondary | ICD-10-CM | POA: Diagnosis not present

## 2017-02-05 DIAGNOSIS — E1143 Type 2 diabetes mellitus with diabetic autonomic (poly)neuropathy: Secondary | ICD-10-CM | POA: Diagnosis not present

## 2017-02-05 DIAGNOSIS — Z794 Long term (current) use of insulin: Secondary | ICD-10-CM | POA: Diagnosis not present

## 2017-02-05 DIAGNOSIS — K3184 Gastroparesis: Secondary | ICD-10-CM | POA: Diagnosis not present

## 2017-02-05 DIAGNOSIS — I2699 Other pulmonary embolism without acute cor pulmonale: Secondary | ICD-10-CM | POA: Diagnosis not present

## 2017-02-05 DIAGNOSIS — Z955 Presence of coronary angioplasty implant and graft: Secondary | ICD-10-CM | POA: Diagnosis not present

## 2017-02-05 DIAGNOSIS — Z9714 Presence of artificial left leg (complete) (partial): Secondary | ICD-10-CM | POA: Diagnosis not present

## 2017-02-05 DIAGNOSIS — N183 Chronic kidney disease, stage 3 (moderate): Secondary | ICD-10-CM | POA: Diagnosis not present

## 2017-02-05 DIAGNOSIS — G4733 Obstructive sleep apnea (adult) (pediatric): Secondary | ICD-10-CM | POA: Diagnosis not present

## 2017-02-05 DIAGNOSIS — Z89512 Acquired absence of left leg below knee: Secondary | ICD-10-CM | POA: Diagnosis not present

## 2017-02-05 DIAGNOSIS — M25562 Pain in left knee: Secondary | ICD-10-CM | POA: Diagnosis not present

## 2017-02-05 DIAGNOSIS — M25662 Stiffness of left knee, not elsewhere classified: Secondary | ICD-10-CM | POA: Diagnosis not present

## 2017-02-05 DIAGNOSIS — Z7901 Long term (current) use of anticoagulants: Secondary | ICD-10-CM | POA: Diagnosis not present

## 2017-02-05 DIAGNOSIS — E1122 Type 2 diabetes mellitus with diabetic chronic kidney disease: Secondary | ICD-10-CM | POA: Diagnosis not present

## 2017-02-12 DIAGNOSIS — G4733 Obstructive sleep apnea (adult) (pediatric): Secondary | ICD-10-CM | POA: Diagnosis not present

## 2017-02-12 DIAGNOSIS — Z8631 Personal history of diabetic foot ulcer: Secondary | ICD-10-CM | POA: Diagnosis not present

## 2017-02-12 DIAGNOSIS — Z794 Long term (current) use of insulin: Secondary | ICD-10-CM | POA: Diagnosis not present

## 2017-02-12 DIAGNOSIS — E1143 Type 2 diabetes mellitus with diabetic autonomic (poly)neuropathy: Secondary | ICD-10-CM | POA: Diagnosis not present

## 2017-02-12 DIAGNOSIS — I129 Hypertensive chronic kidney disease with stage 1 through stage 4 chronic kidney disease, or unspecified chronic kidney disease: Secondary | ICD-10-CM | POA: Diagnosis not present

## 2017-02-12 DIAGNOSIS — Z955 Presence of coronary angioplasty implant and graft: Secondary | ICD-10-CM | POA: Diagnosis not present

## 2017-02-12 DIAGNOSIS — E11610 Type 2 diabetes mellitus with diabetic neuropathic arthropathy: Secondary | ICD-10-CM | POA: Diagnosis not present

## 2017-02-12 DIAGNOSIS — I2699 Other pulmonary embolism without acute cor pulmonale: Secondary | ICD-10-CM | POA: Diagnosis not present

## 2017-02-12 DIAGNOSIS — Z89411 Acquired absence of right great toe: Secondary | ICD-10-CM | POA: Diagnosis not present

## 2017-02-12 DIAGNOSIS — M25662 Stiffness of left knee, not elsewhere classified: Secondary | ICD-10-CM | POA: Diagnosis not present

## 2017-02-12 DIAGNOSIS — I2510 Atherosclerotic heart disease of native coronary artery without angina pectoris: Secondary | ICD-10-CM | POA: Diagnosis not present

## 2017-02-12 DIAGNOSIS — K3184 Gastroparesis: Secondary | ICD-10-CM | POA: Diagnosis not present

## 2017-02-12 DIAGNOSIS — N183 Chronic kidney disease, stage 3 (moderate): Secondary | ICD-10-CM | POA: Diagnosis not present

## 2017-02-12 DIAGNOSIS — E1122 Type 2 diabetes mellitus with diabetic chronic kidney disease: Secondary | ICD-10-CM | POA: Diagnosis not present

## 2017-02-12 DIAGNOSIS — M25562 Pain in left knee: Secondary | ICD-10-CM | POA: Diagnosis not present

## 2017-02-12 DIAGNOSIS — Z7901 Long term (current) use of anticoagulants: Secondary | ICD-10-CM | POA: Diagnosis not present

## 2017-02-12 DIAGNOSIS — Z9714 Presence of artificial left leg (complete) (partial): Secondary | ICD-10-CM | POA: Diagnosis not present

## 2017-02-12 DIAGNOSIS — Z89512 Acquired absence of left leg below knee: Secondary | ICD-10-CM | POA: Diagnosis not present

## 2017-02-14 DIAGNOSIS — M25662 Stiffness of left knee, not elsewhere classified: Secondary | ICD-10-CM | POA: Diagnosis not present

## 2017-02-14 DIAGNOSIS — G4733 Obstructive sleep apnea (adult) (pediatric): Secondary | ICD-10-CM | POA: Diagnosis not present

## 2017-02-14 DIAGNOSIS — Z89512 Acquired absence of left leg below knee: Secondary | ICD-10-CM | POA: Diagnosis not present

## 2017-02-14 DIAGNOSIS — I2510 Atherosclerotic heart disease of native coronary artery without angina pectoris: Secondary | ICD-10-CM | POA: Diagnosis not present

## 2017-02-14 DIAGNOSIS — K3184 Gastroparesis: Secondary | ICD-10-CM | POA: Diagnosis not present

## 2017-02-14 DIAGNOSIS — M25562 Pain in left knee: Secondary | ICD-10-CM | POA: Diagnosis not present

## 2017-02-14 DIAGNOSIS — I2699 Other pulmonary embolism without acute cor pulmonale: Secondary | ICD-10-CM | POA: Diagnosis not present

## 2017-02-14 DIAGNOSIS — E1122 Type 2 diabetes mellitus with diabetic chronic kidney disease: Secondary | ICD-10-CM | POA: Diagnosis not present

## 2017-02-14 DIAGNOSIS — Z794 Long term (current) use of insulin: Secondary | ICD-10-CM | POA: Diagnosis not present

## 2017-02-14 DIAGNOSIS — Z9714 Presence of artificial left leg (complete) (partial): Secondary | ICD-10-CM | POA: Diagnosis not present

## 2017-02-14 DIAGNOSIS — I129 Hypertensive chronic kidney disease with stage 1 through stage 4 chronic kidney disease, or unspecified chronic kidney disease: Secondary | ICD-10-CM | POA: Diagnosis not present

## 2017-02-14 DIAGNOSIS — Z89411 Acquired absence of right great toe: Secondary | ICD-10-CM | POA: Diagnosis not present

## 2017-02-14 DIAGNOSIS — E11610 Type 2 diabetes mellitus with diabetic neuropathic arthropathy: Secondary | ICD-10-CM | POA: Diagnosis not present

## 2017-02-14 DIAGNOSIS — Z7901 Long term (current) use of anticoagulants: Secondary | ICD-10-CM | POA: Diagnosis not present

## 2017-02-14 DIAGNOSIS — N183 Chronic kidney disease, stage 3 (moderate): Secondary | ICD-10-CM | POA: Diagnosis not present

## 2017-02-14 DIAGNOSIS — Z8631 Personal history of diabetic foot ulcer: Secondary | ICD-10-CM | POA: Diagnosis not present

## 2017-02-14 DIAGNOSIS — E1143 Type 2 diabetes mellitus with diabetic autonomic (poly)neuropathy: Secondary | ICD-10-CM | POA: Diagnosis not present

## 2017-02-14 DIAGNOSIS — Z955 Presence of coronary angioplasty implant and graft: Secondary | ICD-10-CM | POA: Diagnosis not present

## 2017-02-19 ENCOUNTER — Emergency Department (HOSPITAL_COMMUNITY): Payer: PPO

## 2017-02-19 ENCOUNTER — Encounter (HOSPITAL_COMMUNITY): Payer: Self-pay | Admitting: Emergency Medicine

## 2017-02-19 ENCOUNTER — Emergency Department (HOSPITAL_COMMUNITY)
Admission: EM | Admit: 2017-02-19 | Discharge: 2017-02-19 | Disposition: A | Discharge status: Home / Self Care | Payer: PPO | Attending: Emergency Medicine | Admitting: Emergency Medicine

## 2017-02-19 DIAGNOSIS — M25562 Pain in left knee: Secondary | ICD-10-CM | POA: Diagnosis not present

## 2017-02-19 DIAGNOSIS — M25662 Stiffness of left knee, not elsewhere classified: Secondary | ICD-10-CM | POA: Diagnosis not present

## 2017-02-19 DIAGNOSIS — R0602 Shortness of breath: Secondary | ICD-10-CM

## 2017-02-19 DIAGNOSIS — K3184 Gastroparesis: Secondary | ICD-10-CM | POA: Insufficient documentation

## 2017-02-19 DIAGNOSIS — Z794 Long term (current) use of insulin: Secondary | ICD-10-CM | POA: Diagnosis not present

## 2017-02-19 DIAGNOSIS — E11610 Type 2 diabetes mellitus with diabetic neuropathic arthropathy: Secondary | ICD-10-CM | POA: Diagnosis not present

## 2017-02-19 DIAGNOSIS — R404 Transient alteration of awareness: Secondary | ICD-10-CM | POA: Diagnosis not present

## 2017-02-19 DIAGNOSIS — Z9714 Presence of artificial left leg (complete) (partial): Secondary | ICD-10-CM | POA: Diagnosis not present

## 2017-02-19 DIAGNOSIS — N183 Chronic kidney disease, stage 3 (moderate): Secondary | ICD-10-CM | POA: Diagnosis not present

## 2017-02-19 DIAGNOSIS — E1143 Type 2 diabetes mellitus with diabetic autonomic (poly)neuropathy: Secondary | ICD-10-CM

## 2017-02-19 DIAGNOSIS — Z79899 Other long term (current) drug therapy: Secondary | ICD-10-CM | POA: Diagnosis not present

## 2017-02-19 DIAGNOSIS — Z8631 Personal history of diabetic foot ulcer: Secondary | ICD-10-CM | POA: Diagnosis not present

## 2017-02-19 DIAGNOSIS — I2699 Other pulmonary embolism without acute cor pulmonale: Secondary | ICD-10-CM | POA: Diagnosis not present

## 2017-02-19 DIAGNOSIS — Z955 Presence of coronary angioplasty implant and graft: Secondary | ICD-10-CM | POA: Insufficient documentation

## 2017-02-19 DIAGNOSIS — I10 Essential (primary) hypertension: Secondary | ICD-10-CM | POA: Diagnosis not present

## 2017-02-19 DIAGNOSIS — I2510 Atherosclerotic heart disease of native coronary artery without angina pectoris: Secondary | ICD-10-CM | POA: Insufficient documentation

## 2017-02-19 DIAGNOSIS — Z89512 Acquired absence of left leg below knee: Secondary | ICD-10-CM | POA: Diagnosis not present

## 2017-02-19 DIAGNOSIS — Z89411 Acquired absence of right great toe: Secondary | ICD-10-CM | POA: Diagnosis not present

## 2017-02-19 DIAGNOSIS — Z7901 Long term (current) use of anticoagulants: Secondary | ICD-10-CM | POA: Diagnosis not present

## 2017-02-19 DIAGNOSIS — R531 Weakness: Secondary | ICD-10-CM | POA: Diagnosis not present

## 2017-02-19 DIAGNOSIS — G4733 Obstructive sleep apnea (adult) (pediatric): Secondary | ICD-10-CM | POA: Diagnosis not present

## 2017-02-19 DIAGNOSIS — E1122 Type 2 diabetes mellitus with diabetic chronic kidney disease: Secondary | ICD-10-CM | POA: Diagnosis not present

## 2017-02-19 DIAGNOSIS — I129 Hypertensive chronic kidney disease with stage 1 through stage 4 chronic kidney disease, or unspecified chronic kidney disease: Secondary | ICD-10-CM | POA: Diagnosis not present

## 2017-02-19 DIAGNOSIS — R1111 Vomiting without nausea: Secondary | ICD-10-CM | POA: Diagnosis present

## 2017-02-19 DIAGNOSIS — R112 Nausea with vomiting, unspecified: Secondary | ICD-10-CM | POA: Diagnosis not present

## 2017-02-19 LAB — COMPREHENSIVE METABOLIC PANEL
ALBUMIN: 3.9 g/dL (ref 3.5–5.0)
ALK PHOS: 67 U/L (ref 38–126)
ALT: 18 U/L (ref 17–63)
AST: 23 U/L (ref 15–41)
Anion gap: 15 (ref 5–15)
BUN: 25 mg/dL — ABNORMAL HIGH (ref 6–20)
CALCIUM: 9.6 mg/dL (ref 8.9–10.3)
CHLORIDE: 101 mmol/L (ref 101–111)
CO2: 21 mmol/L — AB (ref 22–32)
CREATININE: 1.22 mg/dL (ref 0.61–1.24)
GFR calc Af Amer: >60 mL/min (ref >60)
GFR calc non Af Amer: >60 mL/min (ref >60)
GLUCOSE: 217 mg/dL — AB (ref 65–99)
Potassium: 3.9 mmol/L (ref 3.5–5.1)
SODIUM: 137 mmol/L (ref 135–145)
Total Bilirubin: 1 mg/dL (ref 0.3–1.2)
Total Protein: 8 g/dL (ref 6.5–8.1)

## 2017-02-19 LAB — CBC WITH DIFFERENTIAL/PLATELET
BASOS ABS: 0 10*3/uL (ref 0.0–0.1)
Basophils Relative: 0 %
EOS PCT: 0 %
Eosinophils Absolute: 0 10*3/uL (ref 0.0–0.7)
HCT: 44.5 % (ref 39.0–52.0)
Hemoglobin: 15.6 g/dL (ref 13.0–17.0)
LYMPHS ABS: 1 10*3/uL (ref 0.7–4.0)
LYMPHS PCT: 10 %
MCH: 30.9 pg (ref 26.0–34.0)
MCHC: 35.1 g/dL (ref 30.0–36.0)
MCV: 88.1 fL (ref 78.0–100.0)
MONO ABS: 0.5 10*3/uL (ref 0.1–1.0)
MONOS PCT: 5 %
Neutro Abs: 9.3 10*3/uL — ABNORMAL HIGH (ref 1.7–7.7)
Neutrophils Relative %: 85 %
PLATELETS: 258 10*3/uL (ref 150–400)
RBC: 5.05 MIL/uL (ref 4.22–5.81)
RDW: 12.7 % (ref 11.5–15.5)
WBC: 10.9 10*3/uL — AB (ref 4.0–10.5)

## 2017-02-19 LAB — LIPASE, BLOOD: Lipase: 14 U/L (ref 11–51)

## 2017-02-19 LAB — I-STAT TROPONIN, ED: Troponin i, poc: 0.02 ng/mL (ref 0.00–0.08)

## 2017-02-19 MED ORDER — METOCLOPRAMIDE HCL 5 MG/ML IJ SOLN
10.0000 mg | Freq: Once | INTRAMUSCULAR | Status: AC
  Administered 2017-02-19: 10 mg via INTRAVENOUS
  Filled 2017-02-19: qty 2

## 2017-02-19 MED ORDER — PROMETHAZINE HCL 25 MG/ML IJ SOLN
12.5000 mg | Freq: Once | INTRAMUSCULAR | Status: AC
  Administered 2017-02-19: 12.5 mg via INTRAVENOUS
  Filled 2017-02-19: qty 1

## 2017-02-19 MED ORDER — ALBUTEROL SULFATE (2.5 MG/3ML) 0.083% IN NEBU: 5.0000 mg | INHALATION_SOLUTION | Freq: Once | RESPIRATORY_TRACT | Status: DC

## 2017-02-19 MED ORDER — SODIUM CHLORIDE 0.9 % IV BOLUS (SEPSIS)
500.0000 mL | Freq: Once | INTRAVENOUS | Status: AC
  Administered 2017-02-19: 500 mL via INTRAVENOUS

## 2017-02-19 MED ORDER — SODIUM CHLORIDE 0.9 % IV BOLUS (SEPSIS)
1000.0000 mL | Freq: Once | INTRAVENOUS | Status: AC
  Administered 2017-02-19: 1000 mL via INTRAVENOUS

## 2017-02-19 NOTE — ED Notes (Signed)
Pt reports no improvement in nausea. Dr.Delo notified verbal order for Phenergan 12.5mg  IVP and Reglan 10mg  IVP

## 2017-02-19 NOTE — Discharge Instructions (Signed)
Continue your Phenergan as previously prescribed.  Clear liquid diet for the next 12 hours, then slowly advance to normal.  Return to the emergency department if you develop severe abdominal pain, high fevers, bloody stools, or other new and concerning symptoms.

## 2017-02-19 NOTE — ED Notes (Signed)
Pt reports improvement in nausea at this time.

## 2017-02-19 NOTE — ED Triage Notes (Signed)
Patient arrives by EMS with complaints of nausea and vomiting >20 times in the last 24 hours-patient has a history of gastroparesis. EMS witnessed dry heaving. Patient also complained of some SOB but EMS states he has clear lungs and pulse ox stable. BP 146/84 HR 104 O2 sat 100% RA-CBG 176- RR 20 and unlabored. Patient declined Zofran by EMS because he states it does not work and he usually receives phenergan.

## 2017-02-19 NOTE — ED Provider Notes (Signed)
[facility]-EMERGENCY DEPT Provider Note   CSN: [id number] Arrival date & time: 02/19/17  0027     History   Chief Complaint No chief complaint on file.   HPI Samuel KUMPF is a 66 y.o. male.  Patient is a 66 year old male with past medical history of diabetes with gastroparesis, coronary artery disease, pulmonary embolism presenting for evaluation of vomiting. This started proximally 24 hours ago and has been persistent. This is consistent with his prior episodes of gastroparesis. He has been experiencing this intermittently for the past 20 years. Usually Phenergan will help, however he try this at home with little success. He denies any abdominal discomfort. He denies any constipation, diarrhea, or bloody stool. He denies any bloody vomit. He denies any fever. He did report transient shortness of breath to EMS but feels improved now.   The history is provided by the patient.  Emesis   This is a recurrent problem. The current episode started yesterday. The problem occurs continuously. The problem has been rapidly worsening. The emesis has an appearance of stomach contents. There has been no fever. Pertinent negatives include no abdominal pain, no chills, no diarrhea and no fever.    Past Medical History:  Diagnosis Date  . Anxiety   . Arthritis    "all over"   . CAD (coronary artery disease)   . Charcot's joint    "left foot"  . 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 Depression   . Gastroparesis   . GERD (gastroesophageal reflux disease)   . H/O hiatal hernia   . Hyperlipidemia   . Hypertension   . OSA (obstructive sleep apnea)    "not bad enough for a mask"  . Peripheral neuropathy   . Peripheral vascular disease (HCC)   . PONV (postoperative nausea and vomiting)   . Pulmonary embolism (HCC)    hx. of 2012  . Shortness of breath    exertion  . Type II diabetes mellitus [facility])     Patient Active Problem List   Diagnosis Date Noted  . Osteomyelitis of  foot (HCC)   . Protein-calorie malnutrition, severe (HCC) 03/08/2015  . Osteomyelitis of foot, acute (HCC) 03/07/2015  . Sepsis (HCC) 03/07/2015  . Diabetes mellitus type 2, uncontrolled (HCC) 03/07/2015  . Normocytic normochromic anemia 03/07/2015  . Obesity (BMI 30-39.9) 03/07/2015  . Nausea & vomiting   . DM (diabetes mellitus), type 2 (HCC) 01/07/2014  . Chronic osteomyelitis involving lower leg (HCC) 01/07/2014  . Vomiting 12/22/2013  . Gastroparesis 12/21/2013  . Intractable nausea and vomiting 12/21/2013  . Coronary artery disease 05/01/2013  . Essential hypertension 05/01/2013  . Hyperlipidemia 05/01/2013  . Generalized weakness 03/20/2013  . Back pain 03/20/2013  . Pulmonary embolism (HCC) 03/27/2011  . PERFORATION OF GALLBLADDER 08/30/2010  . Ulcer of lower limb, unspecified 03/01/2010  . METHICILLIN SUSCEPTIBLE STAPH AUREUS SEPTICEMIA 01/25/2010  . Acute osteomyelitis, ankle and foot 01/25/2010  . NAUSEA AND VOMITING 01/25/2010  . GERD 11/09/2009  . DIABETES, TYPE 1 05/14/2008  . OBSTRUCTIVE SLEEP APNEA 04/30/2008  . ALLERGIC RHINITIS, SEASONAL 04/30/2008    Past Surgical History:  Procedure Laterality Date  . AMPUTATION Left 03/08/2015   Procedure:  LEFT AMPUTATION BELOW KNEE;  Surgeon: Toni Arthurs, MD;  Location: [facility] ORS;  Service: Orthopedics;  Laterality: Left;  . APPLICATION OF WOUND VAC Left 01/07/2014  . CHOLECYSTECTOMY  06/2010  . CORONARY ANGIOPLASTY WITH STENT PLACEMENT  05/2009   "1"  . FOOT SURGERY Left 2010   "  for Charcot's joint"  . HARDWARE REMOVAL Left 01/07/2014   Procedure: LEFT LEG REMOVAL OF DEEP IMPLANT AND SEQUESTRECTOMY; APPLICATION OF WOUND VAC ;  Surgeon: Toni ArthursJohn Hewitt, MD;  Location: [facility] OR;  Service: Orthopedics;  Laterality: Left;  . I&D EXTREMITY Left "multiple"   leg  . I&D EXTREMITY Left 01/07/2014   Procedure: 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;  Surgeon: Toni ArthursJohn Hewitt, MD;  Location: [facility] OR;  Service: Orthopedics;   Laterality: Left;  . IM NAILING TIBIA Left ~ 2012  . SHOULDER ARTHROSCOPY W/ ROTATOR CUFF REPAIR Left    "and bone spurs"  . TIBIAL IM ROD REMOVAL Left 01/07/2014  . TOE AMPUTATION Right ~ 2011   "great toe"  . VENA CAVA FILTER PLACEMENT  2012  . VENA CAVA FILTER PLACEMENT N/A 11/20/2016   Procedure: INSERTION VENA-CAVA FILTER;  Surgeon: Sherren KernsFields, Charles E, MD;  [facility];  Service: Vascular;  Laterality: N/A;  . WOUND DEBRIDEMENT Left 01/07/2014   "tibia"       Home Medications    Prior to Admission medications   Medication Sig Start Date End Date Taking? Authorizing Provider  acetaminophen (TYLENOL) 325 MG tablet Take 650 mg by mouth every 6 (six) hours as needed for mild pain.    [provider]  amLODipine (NORVASC) 10 MG tablet Take 1 tablet (10 mg total) by mouth daily. 11/22/16   Richarda OverlieAbrol, Nayana, MD  apixaban (ELIQUIS) 5 MG TABS tablet Take 2 tablets (10 mg total) by mouth 2 (two) times daily. 11/21/16 11/28/16  Richarda OverlieAbrol, Nayana, MD  apixaban (ELIQUIS) 5 MG TABS tablet Take 1 tablet (5 mg total) by mouth 2 (two) times daily. 11/29/16 11/29/17  Richarda OverlieAbrol, Nayana, MD  Canagliflozin (INVOKANA) 100 MG TABS Take 100 mg by mouth See admin instructions. invokana 100 mg daily. Alternating every other week between Jardiance and Invokana    [provider]  diphenhydramine-acetaminophen (TYLENOL PM) 25-500 MG TABS tablet Take 1 tablet by mouth at bedtime.    [provider]  docusate sodium (COLACE) 100 MG capsule Take 100 mg by mouth daily as needed (for constipation).     [provider]  empagliflozin (JARDIANCE) 10 MG TABS tablet Take 10 mg by mouth See admin instructions. Jardiance 10 mg daily.  Alternating every other week between Jardiance and Invokana    [provider]  ferrous sulfate 325 (65 FE) MG tablet Take 325 mg by mouth every evening.     [provider]  gabapentin (NEURONTIN) 300 MG capsule Take 300 mg by mouth 3 (three) times  daily.    [provider]  insulin detemir (LEVEMIR) 100 UNIT/ML injection Inject 85 Units into the skin 2 (two) times daily.     [provider]  insulin lispro (HUMALOG) 100 UNIT/ML injection Inject 0-10 Units into the skin 3 (three) times daily with meals. Inject SQ per sliding scale 150-250 (5 units), 251-350(8 units), 351-450(10 units), >450=10 units    [provider]  metoCLOPramide (REGLAN) 10 MG tablet Take 10 mg by mouth at bedtime.     [provider]  omeprazole (PRILOSEC) 20 MG capsule Take 20 mg by mouth daily.     [provider]  pantoprazole (PROTONIX) 40 MG tablet Take 40 mg by mouth 2 (two) times daily as needed (for heartburn/indigestion).     [provider]  pioglitazone (ACTOS) 30 MG tablet Take 30 mg by mouth at bedtime.     [provider]  polyethylene glycol (MIRALAX /  GLYCOLAX) packet Take 17 g by mouth daily. Patient taking differently: Take 17 g by mouth daily as needed for moderate constipation.  03/10/15   Hollice Espy, MD  promethazine (PHENERGAN) 25 MG suppository Place 25 mg rectally every 6 (six) hours as needed for nausea or vomiting.    [provider]  promethazine (PHENERGAN) 25 MG tablet Take 25 mg by mouth every 8 (eight) hours as needed for nausea or vomiting. Take one tablet by mouth every 8 hours as needed nausea/vomiting    [provider]  ranitidine (ZANTAC) 150 MG tablet Take 150 mg by mouth at bedtime.     [provider]  simvastatin (ZOCOR) 40 MG tablet Take 60 mg by mouth daily at 6 PM. Take one and half tablet by mouth once daily in the evening for cholesterol    [provider]  traMADol (ULTRAM) 50 MG tablet Take 100 mg by mouth every 12 (twelve) hours as needed (pain).    [provider]    Family History Family History  Problem Relation Age of Onset  . COPD Mother   . Heart disease Mother   . Lung cancer Mother   . Retinal  detachment Father   . Alcoholism Brother   . Heart disease Brother   . COPD Brother   . Diabetes Brother   . Hypertension Brother   . Stroke Brother     Social History Social History  Substance Use Topics  . Smoking status: Never Smoker  . Smokeless tobacco: Never Used  . Alcohol use Yes     Comment: 01/07/2014 'might have a wine cooler a couple times/yr"     Allergies   Codeine; Gabapentin; and Nabumetone   Review of Systems Review of Systems  Constitutional: Negative for chills and fever.  Gastrointestinal: Positive for vomiting. Negative for abdominal pain and diarrhea.  All other systems reviewed and are negative.    Physical Exam Updated Vital Signs BP 132/70 (BP Location: Right Arm)   Pulse (!) 103   Temp 97.9 F (36.6 C) (Oral)   Resp (!) 24   Ht 6\' 3"  (1.905 m)   Wt (!) 140.6 kg (310 lb)   SpO2 98%   BMI 38.75 kg/m   Physical Exam  Constitutional: He is oriented to person, place, and time. He appears well-developed and well-nourished. No distress.  HENT:  Head: Normocephalic and atraumatic.  Mouth/Throat: Oropharynx is clear and moist.  Neck: Normal range of motion. Neck supple.  Cardiovascular: Normal rate and regular rhythm.  Exam reveals no friction rub.   No murmur heard. Pulmonary/Chest: Effort normal and breath sounds normal. No respiratory distress. He has no wheezes. He has no rales.  Abdominal: Soft. Bowel sounds are normal. He exhibits no distension. There is no tenderness.  Musculoskeletal: Normal range of motion. He exhibits no edema.  Neurological: He is alert and oriented to person, place, and time. Coordination normal.  Skin: Skin is warm and dry. He is not diaphoretic.  Nursing note and vitals reviewed.    ED Treatments / Results  Labs (all labs ordered are listed, but only abnormal results are displayed) Labs Reviewed  COMPREHENSIVE METABOLIC PANEL  LIPASE, BLOOD  CBC WITH DIFFERENTIAL/PLATELET  I-STAT TROPONIN, ED     EKG  EKG Interpretation None       Radiology No results found.  Procedures Procedures (including critical care time)  Medications Ordered in ED Medications  promethazine (PHENERGAN) injection 12.5 mg (not administered)  sodium chloride 0.9 %  bolus 1,000 mL (not administered)     Initial Impression / Assessment and Plan / ED Course  I have reviewed the triage vital signs and the nursing notes.  Pertinent labs & imaging results that were available during my care of the patient were reviewed by me and considered in my medical decision making (see chart for details).  Patient is significantly improved after medications and fluids in the ED. He has a history of gastroparesis and this appears to be a recurrence. His laboratory studies are reassuring. There is no ketoacidosis and no evidence for a cardiac etiology. He will be discharged with continued Phenergan and clear liquids as tolerated.  Final Clinical Impressions(s) / ED Diagnoses   Final diagnoses:  None    New Prescriptions New Prescriptions   No medications on file     Geoffery Lyons, MD 02/19/17 [phone]

## 2017-02-19 NOTE — ED Notes (Signed)
Bed: [id number] Expected date:  Expected time:  Means of arrival:  Comments: EMS 66 yo male vomiting/gastroparesis

## 2017-03-26 DIAGNOSIS — E1165 Type 2 diabetes mellitus with hyperglycemia: Secondary | ICD-10-CM | POA: Diagnosis not present

## 2017-03-26 DIAGNOSIS — I10 Essential (primary) hypertension: Secondary | ICD-10-CM | POA: Diagnosis not present

## 2017-04-01 DIAGNOSIS — Z23 Encounter for immunization: Secondary | ICD-10-CM | POA: Diagnosis not present

## 2017-04-01 DIAGNOSIS — M25552 Pain in left hip: Secondary | ICD-10-CM | POA: Diagnosis not present

## 2017-04-01 DIAGNOSIS — M25551 Pain in right hip: Secondary | ICD-10-CM | POA: Diagnosis not present

## 2017-04-01 DIAGNOSIS — Z794 Long term (current) use of insulin: Secondary | ICD-10-CM | POA: Diagnosis not present

## 2017-04-01 DIAGNOSIS — E118 Type 2 diabetes mellitus with unspecified complications: Secondary | ICD-10-CM | POA: Diagnosis not present

## 2017-04-01 DIAGNOSIS — R110 Nausea: Secondary | ICD-10-CM | POA: Diagnosis not present

## 2017-04-07 ENCOUNTER — Encounter (HOSPITAL_COMMUNITY): Payer: Self-pay | Admitting: Emergency Medicine

## 2017-04-07 ENCOUNTER — Emergency Department (HOSPITAL_COMMUNITY): Payer: PPO

## 2017-04-07 ENCOUNTER — Inpatient Hospital Stay (HOSPITAL_COMMUNITY)
Admission: EM | Admit: 2017-04-07 | Discharge: 2017-04-11 | DRG: 281 | Disposition: A | Discharge status: Home Health | Payer: PPO | Attending: Internal Medicine | Admitting: Internal Medicine

## 2017-04-07 ENCOUNTER — Emergency Department (HOSPITAL_COMMUNITY)
Admission: EM | Admit: 2017-04-07 | Discharge: 2017-04-07 | Discharge status: Against Medical Advice | Payer: PPO | Source: Home / Self Care

## 2017-04-07 ENCOUNTER — Encounter (HOSPITAL_COMMUNITY): Payer: Self-pay

## 2017-04-07 DIAGNOSIS — R1110 Vomiting, unspecified: Secondary | ICD-10-CM | POA: Diagnosis not present

## 2017-04-07 DIAGNOSIS — E119 Type 2 diabetes mellitus without complications: Secondary | ICD-10-CM

## 2017-04-07 DIAGNOSIS — Z89411 Acquired absence of right great toe: Secondary | ICD-10-CM

## 2017-04-07 DIAGNOSIS — Z66 Do not resuscitate: Secondary | ICD-10-CM | POA: Diagnosis not present

## 2017-04-07 DIAGNOSIS — E1143 Type 2 diabetes mellitus with diabetic autonomic (poly)neuropathy: Secondary | ICD-10-CM | POA: Diagnosis not present

## 2017-04-07 DIAGNOSIS — I5030 Unspecified diastolic (congestive) heart failure: Secondary | ICD-10-CM | POA: Diagnosis not present

## 2017-04-07 DIAGNOSIS — R112 Nausea with vomiting, unspecified: Secondary | ICD-10-CM | POA: Diagnosis not present

## 2017-04-07 DIAGNOSIS — E669 Obesity, unspecified: Secondary | ICD-10-CM | POA: Diagnosis present

## 2017-04-07 DIAGNOSIS — Z9861 Coronary angioplasty status: Secondary | ICD-10-CM

## 2017-04-07 DIAGNOSIS — I878 Other specified disorders of veins: Secondary | ICD-10-CM | POA: Diagnosis present

## 2017-04-07 DIAGNOSIS — E1142 Type 2 diabetes mellitus with diabetic polyneuropathy: Secondary | ICD-10-CM | POA: Diagnosis not present

## 2017-04-07 DIAGNOSIS — Z888 Allergy status to other drugs, medicaments and biological substances status: Secondary | ICD-10-CM

## 2017-04-07 DIAGNOSIS — E1165 Type 2 diabetes mellitus with hyperglycemia: Secondary | ICD-10-CM | POA: Diagnosis present

## 2017-04-07 DIAGNOSIS — J449 Chronic obstructive pulmonary disease, unspecified: Secondary | ICD-10-CM | POA: Diagnosis not present

## 2017-04-07 DIAGNOSIS — I214 Non-ST elevation (NSTEMI) myocardial infarction: Principal | ICD-10-CM | POA: Diagnosis present

## 2017-04-07 DIAGNOSIS — Z794 Long term (current) use of insulin: Secondary | ICD-10-CM

## 2017-04-07 DIAGNOSIS — G629 Polyneuropathy, unspecified: Secondary | ICD-10-CM | POA: Diagnosis present

## 2017-04-07 DIAGNOSIS — E1151 Type 2 diabetes mellitus with diabetic peripheral angiopathy without gangrene: Secondary | ICD-10-CM | POA: Diagnosis not present

## 2017-04-07 DIAGNOSIS — Z5321 Procedure and treatment not carried out due to patient leaving prior to being seen by health care provider: Secondary | ICD-10-CM

## 2017-04-07 DIAGNOSIS — I25118 Atherosclerotic heart disease of native coronary artery with other forms of angina pectoris: Secondary | ICD-10-CM | POA: Diagnosis not present

## 2017-04-07 DIAGNOSIS — E1169 Type 2 diabetes mellitus with other specified complication: Secondary | ICD-10-CM | POA: Diagnosis present

## 2017-04-07 DIAGNOSIS — Z993 Dependence on wheelchair: Secondary | ICD-10-CM | POA: Diagnosis not present

## 2017-04-07 DIAGNOSIS — Z79899 Other long term (current) drug therapy: Secondary | ICD-10-CM

## 2017-04-07 DIAGNOSIS — I110 Hypertensive heart disease with heart failure: Secondary | ICD-10-CM | POA: Diagnosis present

## 2017-04-07 DIAGNOSIS — IMO0002 Reserved for concepts with insufficient information to code with codable children: Secondary | ICD-10-CM | POA: Diagnosis present

## 2017-04-07 DIAGNOSIS — I2699 Other pulmonary embolism without acute cor pulmonale: Secondary | ICD-10-CM | POA: Diagnosis present

## 2017-04-07 DIAGNOSIS — I10 Essential (primary) hypertension: Secondary | ICD-10-CM | POA: Diagnosis not present

## 2017-04-07 DIAGNOSIS — G4733 Obstructive sleep apnea (adult) (pediatric): Secondary | ICD-10-CM | POA: Diagnosis present

## 2017-04-07 DIAGNOSIS — I2782 Chronic pulmonary embolism: Secondary | ICD-10-CM | POA: Diagnosis not present

## 2017-04-07 DIAGNOSIS — M86662 Other chronic osteomyelitis, left tibia and fibula: Secondary | ICD-10-CM

## 2017-04-07 DIAGNOSIS — R0602 Shortness of breath: Secondary | ICD-10-CM | POA: Diagnosis not present

## 2017-04-07 DIAGNOSIS — E785 Hyperlipidemia, unspecified: Secondary | ICD-10-CM | POA: Diagnosis present

## 2017-04-07 DIAGNOSIS — Z9181 History of falling: Secondary | ICD-10-CM

## 2017-04-07 DIAGNOSIS — E43 Unspecified severe protein-calorie malnutrition: Secondary | ICD-10-CM

## 2017-04-07 DIAGNOSIS — Z885 Allergy status to narcotic agent status: Secondary | ICD-10-CM

## 2017-04-07 DIAGNOSIS — K219 Gastro-esophageal reflux disease without esophagitis: Secondary | ICD-10-CM | POA: Diagnosis not present

## 2017-04-07 DIAGNOSIS — E876 Hypokalemia: Secondary | ICD-10-CM | POA: Diagnosis not present

## 2017-04-07 DIAGNOSIS — Z6835 Body mass index (BMI) 35.0-35.9, adult: Secondary | ICD-10-CM

## 2017-04-07 DIAGNOSIS — E11610 Type 2 diabetes mellitus with diabetic neuropathic arthropathy: Secondary | ICD-10-CM | POA: Diagnosis not present

## 2017-04-07 DIAGNOSIS — Z01810 Encounter for preprocedural cardiovascular examination: Secondary | ICD-10-CM | POA: Diagnosis not present

## 2017-04-07 DIAGNOSIS — Z886 Allergy status to analgesic agent status: Secondary | ICD-10-CM

## 2017-04-07 DIAGNOSIS — Z7982 Long term (current) use of aspirin: Secondary | ICD-10-CM | POA: Diagnosis not present

## 2017-04-07 DIAGNOSIS — K3184 Gastroparesis: Secondary | ICD-10-CM | POA: Diagnosis present

## 2017-04-07 DIAGNOSIS — R000 Tachycardia, unspecified: Secondary | ICD-10-CM | POA: Diagnosis not present

## 2017-04-07 DIAGNOSIS — Z955 Presence of coronary angioplasty implant and graft: Secondary | ICD-10-CM | POA: Diagnosis not present

## 2017-04-07 DIAGNOSIS — Z86711 Personal history of pulmonary embolism: Secondary | ICD-10-CM

## 2017-04-07 DIAGNOSIS — I700 Atherosclerosis of aorta: Secondary | ICD-10-CM | POA: Diagnosis not present

## 2017-04-07 DIAGNOSIS — I2510 Atherosclerotic heart disease of native coronary artery without angina pectoris: Secondary | ICD-10-CM | POA: Diagnosis not present

## 2017-04-07 DIAGNOSIS — Z89512 Acquired absence of left leg below knee: Secondary | ICD-10-CM

## 2017-04-07 DIAGNOSIS — R079 Chest pain, unspecified: Secondary | ICD-10-CM | POA: Diagnosis not present

## 2017-04-07 DIAGNOSIS — Y840 Cardiac catheterization as the cause of abnormal reaction of the patient, or of later complication, without mention of misadventure at the time of the procedure: Secondary | ICD-10-CM | POA: Diagnosis not present

## 2017-04-07 LAB — CBC WITH DIFFERENTIAL/PLATELET
BASOS PCT: 0 %
Basophils Absolute: 0 10*3/uL (ref 0.0–0.1)
Eosinophils Absolute: 0 10*3/uL (ref 0.0–0.7)
Eosinophils Relative: 0 %
HEMATOCRIT: 44.2 % (ref 39.0–52.0)
HEMOGLOBIN: 14.9 g/dL (ref 13.0–17.0)
LYMPHS ABS: 0.8 10*3/uL (ref 0.7–4.0)
Lymphocytes Relative: 6 %
MCH: 30.4 pg (ref 26.0–34.0)
MCHC: 33.7 g/dL (ref 30.0–36.0)
MCV: 90.2 fL (ref 78.0–100.0)
MONOS PCT: 3 %
Monocytes Absolute: 0.4 10*3/uL (ref 0.1–1.0)
NEUTROS ABS: 11.2 10*3/uL — AB (ref 1.7–7.7)
NEUTROS PCT: 91 %
PLATELETS: 241 10*3/uL (ref 150–400)
RBC: 4.9 MIL/uL (ref 4.22–5.81)
RDW: 12.9 % (ref 11.5–15.5)
WBC: 12.4 10*3/uL — AB (ref 4.0–10.5)

## 2017-04-07 LAB — URINALYSIS, ROUTINE W REFLEX MICROSCOPIC
BACTERIA UA: NONE SEEN
BILIRUBIN URINE: NEGATIVE
Glucose, UA: >=500 mg/dL — AB
Ketones, ur: 80 mg/dL — AB
LEUKOCYTES UA: NEGATIVE
NITRITE: NEGATIVE
PH: 5 (ref 5.0–8.0)
Protein, ur: 30 mg/dL — AB
SPECIFIC GRAVITY, URINE: 1.027 (ref 1.005–1.030)
Squamous Epithelial / LPF: NONE SEEN

## 2017-04-07 LAB — COMPREHENSIVE METABOLIC PANEL
ALT: 20 U/L (ref 17–63)
AST: 21 U/L (ref 15–41)
Albumin: 3.9 g/dL (ref 3.5–5.0)
Alkaline Phosphatase: 63 U/L (ref 38–126)
BUN: 19 mg/dL (ref 6–20)
CALCIUM: 9.6 mg/dL (ref 8.9–10.3)
CHLORIDE: 99 mmol/L — AB (ref 101–111)
CO2: 17 mmol/L — AB (ref 22–32)
Creatinine, Ser: 1.37 mg/dL — ABNORMAL HIGH (ref 0.61–1.24)
GFR, EST NON AFRICAN AMERICAN: 53 mL/min — AB (ref >60)
Glucose, Bld: 291 mg/dL — ABNORMAL HIGH (ref 65–99)
Potassium: 4.1 mmol/L (ref 3.5–5.1)
Sodium: 137 mmol/L (ref 135–145)
Total Bilirubin: 1.5 mg/dL — ABNORMAL HIGH (ref 0.3–1.2)
Total Protein: 8.1 g/dL (ref 6.5–8.1)

## 2017-04-07 LAB — I-STAT TROPONIN, ED: Troponin i, poc: 0.26 ng/mL (ref 0.00–0.08)

## 2017-04-07 LAB — PROTIME-INR
INR: 1.02
PROTHROMBIN TIME: 13.3 s (ref 11.4–15.2)

## 2017-04-07 LAB — CBG MONITORING, ED
GLUCOSE-CAPILLARY: 280 mg/dL — AB (ref 65–99)
GLUCOSE-CAPILLARY: 267 mg/dL — AB (ref 65–99)

## 2017-04-07 LAB — HEMOGLOBIN A1C
Hgb A1c MFr Bld: 9.6 % — ABNORMAL HIGH (ref 4.8–5.6)
MEAN PLASMA GLUCOSE: 228.82 mg/dL

## 2017-04-07 LAB — TROPONIN I
TROPONIN I: 0.32 ng/mL — AB (ref <0.03)
TROPONIN I: 0.75 ng/mL — AB (ref <0.03)

## 2017-04-07 LAB — GLUCOSE, CAPILLARY: Glucose-Capillary: 206 mg/dL — ABNORMAL HIGH (ref 65–99)

## 2017-04-07 LAB — HEPARIN LEVEL (UNFRACTIONATED): HEPARIN UNFRACTIONATED: 0.58 [IU]/mL (ref 0.30–0.70)

## 2017-04-07 LAB — APTT: aPTT: 28 seconds (ref 24–36)

## 2017-04-07 LAB — LIPASE, BLOOD: LIPASE: 17 U/L (ref 11–51)

## 2017-04-07 MED ORDER — SODIUM CHLORIDE 0.9% FLUSH
3.0000 mL | INTRAVENOUS | Status: DC | PRN
  Administered 2017-04-09: 3 mL via INTRAVENOUS
  Filled 2017-04-07: qty 3

## 2017-04-07 MED ORDER — AMLODIPINE BESYLATE 10 MG PO TABS
10.0000 mg | ORAL_TABLET | Freq: Every day | ORAL | Status: DC
  Administered 2017-04-07 – 2017-04-11 (×5): 10 mg via ORAL
  Filled 2017-04-07 (×5): qty 1

## 2017-04-07 MED ORDER — ONDANSETRON HCL 4 MG/2ML IJ SOLN
4.0000 mg | Freq: Four times a day (QID) | INTRAMUSCULAR | Status: DC | PRN
  Administered 2017-04-07: 4 mg via INTRAVENOUS
  Filled 2017-04-07: qty 2

## 2017-04-07 MED ORDER — SODIUM CHLORIDE 0.9 % IV BOLUS (SEPSIS)
1000.0000 mL | Freq: Once | INTRAVENOUS | Status: AC
  Administered 2017-04-07: 1000 mL via INTRAVENOUS

## 2017-04-07 MED ORDER — METOCLOPRAMIDE HCL 10 MG PO TABS
10.0000 mg | ORAL_TABLET | Freq: Every day | ORAL | Status: DC
  Administered 2017-04-07: 10 mg via ORAL
  Filled 2017-04-07: qty 1

## 2017-04-07 MED ORDER — IOPAMIDOL (ISOVUE-370) INJECTION 76%
INTRAVENOUS | Status: AC
  Administered 2017-04-07: 17:00:00
  Filled 2017-04-07: qty 100

## 2017-04-07 MED ORDER — PANTOPRAZOLE SODIUM 40 MG IV SOLR
40.0000 mg | Freq: Once | INTRAVENOUS | Status: AC
  Administered 2017-04-07: 40 mg via INTRAVENOUS
  Filled 2017-04-07: qty 40

## 2017-04-07 MED ORDER — GABAPENTIN 300 MG PO CAPS
300.0000 mg | ORAL_CAPSULE | Freq: Three times a day (TID) | ORAL | Status: DC
  Administered 2017-04-07 – 2017-04-11 (×12): 300 mg via ORAL
  Filled 2017-04-07 (×12): qty 1

## 2017-04-07 MED ORDER — PROMETHAZINE HCL 25 MG/ML IJ SOLN
25.0000 mg | Freq: Once | INTRAMUSCULAR | Status: DC
  Filled 2017-04-07: qty 1

## 2017-04-07 MED ORDER — NITROGLYCERIN 2 % TD OINT
0.5000 [in_us] | TOPICAL_OINTMENT | Freq: Four times a day (QID) | TRANSDERMAL | Status: DC
  Administered 2017-04-07 – 2017-04-08 (×5): 0.5 [in_us] via TOPICAL
  Filled 2017-04-07: qty 1
  Filled 2017-04-07 (×25): qty 30

## 2017-04-07 MED ORDER — IOPAMIDOL (ISOVUE-370) INJECTION 76%
100.0000 mL | Freq: Once | INTRAVENOUS | Status: AC | PRN
  Administered 2017-04-07: 100 mL via INTRAVENOUS

## 2017-04-07 MED ORDER — ASPIRIN 81 MG PO CHEW
324.0000 mg | CHEWABLE_TABLET | Freq: Once | ORAL | Status: AC
  Administered 2017-04-07: 324 mg via ORAL
  Filled 2017-04-07: qty 4

## 2017-04-07 MED ORDER — ATORVASTATIN CALCIUM 20 MG PO TABS: 30.0000 mg | ORAL_TABLET | Freq: Every day | ORAL | Status: DC

## 2017-04-07 MED ORDER — MORPHINE SULFATE (PF) 2 MG/ML IV SOLN
2.0000 mg | Freq: Once | INTRAVENOUS | Status: AC
  Administered 2017-04-07: 2 mg via INTRAVENOUS
  Filled 2017-04-07: qty 1

## 2017-04-07 MED ORDER — SIMVASTATIN 40 MG PO TABS: 60.0000 mg | ORAL_TABLET | Freq: Every day | ORAL | Status: DC

## 2017-04-07 MED ORDER — HEPARIN (PORCINE) IN NACL 100-0.45 UNIT/ML-% IJ SOLN
1500.0000 [IU]/h | INTRAMUSCULAR | Status: DC
  Administered 2017-04-07 – 2017-04-08 (×2): 1500 [IU]/h via INTRAVENOUS
  Filled 2017-04-07 (×2): qty 250

## 2017-04-07 MED ORDER — NITROGLYCERIN 0.4 MG SL SUBL
0.4000 mg | SUBLINGUAL_TABLET | SUBLINGUAL | Status: DC | PRN
  Administered 2017-04-07 – 2017-04-08 (×3): 0.4 mg via SUBLINGUAL
  Filled 2017-04-07 (×2): qty 1

## 2017-04-07 MED ORDER — INSULIN DETEMIR 100 UNIT/ML ~~LOC~~ SOLN
85.0000 [IU] | Freq: Two times a day (BID) | SUBCUTANEOUS | Status: DC
  Administered 2017-04-07 – 2017-04-10 (×5): 85 [IU] via SUBCUTANEOUS
  Filled 2017-04-07 (×7): qty 0.85

## 2017-04-07 MED ORDER — SODIUM CHLORIDE 0.9% FLUSH
3.0000 mL | Freq: Two times a day (BID) | INTRAVENOUS | Status: DC
  Administered 2017-04-07 – 2017-04-10 (×4): 3 mL via INTRAVENOUS

## 2017-04-07 MED ORDER — MORPHINE SULFATE (PF) 4 MG/ML IV SOLN
4.0000 mg | INTRAVENOUS | Status: DC | PRN
  Administered 2017-04-07 – 2017-04-09 (×3): 4 mg via INTRAVENOUS
  Filled 2017-04-07 (×3): qty 1

## 2017-04-07 MED ORDER — INSULIN ASPART 100 UNIT/ML ~~LOC~~ SOLN
15.0000 [IU] | Freq: Once | SUBCUTANEOUS | Status: AC
  Administered 2017-04-07: 15 [IU] via SUBCUTANEOUS
  Filled 2017-04-07: qty 1

## 2017-04-07 MED ORDER — HEPARIN BOLUS VIA INFUSION
4000.0000 [IU] | Freq: Once | INTRAVENOUS | Status: AC
  Administered 2017-04-07: 4000 [IU] via INTRAVENOUS
  Filled 2017-04-07: qty 4000

## 2017-04-07 MED ORDER — INSULIN ASPART 100 UNIT/ML ~~LOC~~ SOLN
0.0000 [IU] | SUBCUTANEOUS | Status: DC
Start: 2017-04-07 — End: 2017-04-08
  Administered 2017-04-07: 3 [IU] via SUBCUTANEOUS
  Administered 2017-04-08: 2 [IU] via SUBCUTANEOUS
  Administered 2017-04-08 (×2): 1 [IU] via SUBCUTANEOUS

## 2017-04-07 MED ORDER — PROMETHAZINE HCL 25 MG/ML IJ SOLN
25.0000 mg | Freq: Once | INTRAMUSCULAR | Status: AC
  Administered 2017-04-07: 25 mg via INTRAVENOUS
  Filled 2017-04-07: qty 1

## 2017-04-07 MED ORDER — SODIUM CHLORIDE 0.9 % IV SOLN: 250.0000 mL | INTRAVENOUS | Status: DC | PRN

## 2017-04-07 MED ORDER — ASPIRIN EC 81 MG PO TBEC
81.0000 mg | DELAYED_RELEASE_TABLET | Freq: Every day | ORAL | Status: DC
  Administered 2017-04-08 – 2017-04-11 (×4): 81 mg via ORAL
  Filled 2017-04-07 (×5): qty 1

## 2017-04-07 NOTE — ED Triage Notes (Signed)
Per EMS, pt from home.  Pt here yesterday for same and left AMA.  Comes today c/o n/v for 2 days.  Poor appetite.  Orthostatic 130's, standing 92's systolic.  Dizzy with standing.  20g RFA.  4mg  zofran given.  Vitals:  131/76, resp 30, 98% ra, cbg 321

## 2017-04-07 NOTE — ED Notes (Signed)
Dr Madilyn Hookees and Samuel BridgeMartha notified of elevated troponin 0.26

## 2017-04-07 NOTE — H&P (Signed)
History and Physical    Samuel [name] QIO:[id number] DOB: 1951-02-07 DOA: 04/07/2017  PCP: [name], MD  Patient coming from:  home  Chief Complaint:  Chest pain, n/v  HPI: Samuel [name] is a 66 y.o. male with medical history significant of CAD s/p stenting in 2010, DM, PE quit taking eloquis due to cost issues, GERD, OSA, HTN comes in with 2 days of epigastric /sscp that does not radiate but associated with a lot of nausea and vomiting.  He has been feeling awful since yesterday morning.  He thought he was coming down with the flu.  The pain is "pressure" in his chest and has become more persistent and is worse when he is vomiting.  No fevers.  No cough.  His glucose has been more elevated more than usual also with glucose over 300 and he hasnt been able to eat either.  Pt found to have elevated troponin level and referred for admission for NSTEMI.  Cardiology has been called and [facility].  Review of Systems: As per HPI otherwise 10 point review of systems negative.   Past Medical History:  Diagnosis Date  . Anxiety   . Arthritis    "all over"   . CAD (coronary artery disease)   . Charcot's joint    "left foot"  . 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 Depression   . Gastroparesis   . GERD (gastroesophageal reflux disease)   . H/O hiatal hernia   . Hyperlipidemia   . Hypertension   . OSA (obstructive sleep apnea)    "not bad enough for a mask"  . Peripheral neuropathy   . Peripheral vascular disease (HCC)   . PONV (postoperative nausea and vomiting)   . Pulmonary embolism (HCC)    hx. of 2012  . Shortness of breath    exertion  . Type II diabetes mellitus (HCC)     Past Surgical History:  Procedure Laterality Date  . AMPUTATION Left 03/08/2015   Procedure:  LEFT AMPUTATION BELOW KNEE;  Surgeon: Toni Arthurs, MD;  Location: [facility];  Service: Orthopedics;  Laterality: Left;  . APPLICATION OF WOUND VAC Left 01/07/2014  . CHOLECYSTECTOMY   06/2010  . CORONARY ANGIOPLASTY WITH STENT PLACEMENT  05/2009   "1"  . FOOT SURGERY Left 2010   "for Charcot's joint"  . HARDWARE REMOVAL Left 01/07/2014   Procedure: LEFT LEG REMOVAL OF DEEP IMPLANT AND SEQUESTRECTOMY; APPLICATION OF WOUND VAC ;  Surgeon: Toni Arthurs, MD;  Location: [facility] OR;  Service: Orthopedics;  Laterality: Left;  . I&D EXTREMITY Left "multiple"   leg  . 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 IM NAILING TIBIA Left ~ 2012  . SHOULDER ARTHROSCOPY W/ ROTATOR CUFF REPAIR Left    "and bone spurs"  . TIBIAL IM ROD REMOVAL Left 01/07/2014  . TOE AMPUTATION Right ~ 2011   "great toe"  . VENA CAVA FILTER PLACEMENT  2012  . VENA CAVA FILTER PLACEMENT N/A 11/20/2016   Procedure: INSERTION VENA-CAVA FILTER;  Surgeon: Sherren Kerns, MD;  Location: [facility] OR;  Service: Vascular;  Laterality: N/A;  . WOUND DEBRIDEMENT Left 01/07/2014   "tibia"     reports that he has never smoked. He has never used smokeless tobacco. He reports that he drinks alcohol. He reports that he does not  use drugs.  Allergies  Allergen Reactions  . Codeine Other (See Comments)    REACTION: GI Upset  . Gabapentin Hives, Itching and Rash    REACTION: rash/hives (per patient, was a reaction to an inactive ingredient in another mgf brand). The patient stated that he does take this medication now and it doesn't cause a rash any more.  . Nabumetone Itching, Rash and Other (See Comments)    REACTION: sick on stomach and rash    Family History  Problem Relation Age of Onset  . COPD Mother   . Heart disease Mother   . Lung cancer Mother   . Retinal detachment Father   . Alcoholism Brother   . Heart disease Brother   . COPD Brother   . Diabetes Brother   . Hypertension Brother   . Stroke Brother     Prior to Admission medications   Medication Sig Start Date End  Date Taking? Authorizing Provider  amLODipine (NORVASC) 10 MG tablet Take 1 tablet (10 mg total) by mouth daily. 11/22/16  Yes Richarda Overlie, MD  Canagliflozin (INVOKANA) 100 MG TABS Take 100 mg by mouth See admin instructions. invokana 100 mg daily. Alternating every other week between Jardiance and Invokana   Yes [provider]  diphenhydramine-acetaminophen (TYLENOL PM) 25-500 MG TABS tablet Take 1 tablet by mouth at bedtime.   Yes [provider]  docusate sodium (COLACE) 100 MG capsule Take 100 mg by mouth daily as needed (for constipation).    Yes [provider]  empagliflozin (JARDIANCE) 10 MG TABS tablet Take 10 mg by mouth See admin instructions. Jardiance 10 mg daily.  Alternating every other week between Jardiance and Invokana   Yes [provider]  ferrous sulfate 325 (65 FE) MG tablet Take 325 mg by mouth every evening.    Yes [provider]  gabapentin (NEURONTIN) 300 MG capsule Take 300 mg by mouth 3 (three) times daily.   Yes [provider]  insulin detemir (LEVEMIR) 100 UNIT/ML injection Inject 85 Units into the skin 2 (two) times daily.    Yes [provider]  insulin lispro (HUMALOG) 100 UNIT/ML injection Inject 0-10 Units into the skin 3 (three) times daily with meals. Inject SQ per sliding scale 150-250 (5 units), 251-350(8 units), 351-450(10 units), >450=10 units   Yes [provider]  lansoprazole (PREVACID) 30 MG capsule Take 30 mg by mouth 2 (two) times daily. 03/19/17  Yes [provider]  metoCLOPramide (REGLAN) 10 MG tablet Take 10 mg by mouth at bedtime.    Yes [provider]  pioglitazone (ACTOS) 30 MG tablet Take 30 mg by mouth at bedtime.    Yes [provider]  polyethylene glycol (MIRALAX / GLYCOLAX) packet Take 17 g by mouth daily. Patient taking differently: Take 17 g by mouth daily as needed for moderate constipation.  03/10/15  Yes Hollice Espy, MD    prochlorperazine (COMPAZINE) 10 MG tablet Take 10 mg by mouth daily as needed for nausea/vomiting. 04/01/17  Yes [provider]  promethazine (PHENERGAN) 25 MG suppository Place 25 mg rectally every 6 (six) hours as needed for nausea or vomiting.   Yes [provider]  promethazine (PHENERGAN) 25 MG tablet Take 25 mg by mouth every 8 (eight) hours as needed for nausea or vomiting. Take one tablet by mouth every 8 hours as needed nausea/vomiting   Yes [provider]  quinapril (ACCUPRIL) 5 MG tablet Take 5 mg by mouth daily. 03/19/17  Yes [provider]  ranitidine (ZANTAC) 150 MG tablet Take 150 mg by mouth at bedtime.    Yes [provider]  simvastatin (ZOCOR) 40 MG tablet Take 60 mg by mouth daily at 6 PM. Take one and half tablet by mouth once daily in the evening for cholesterol   Yes [provider]  traMADol (ULTRAM) 50 MG tablet Take 100 mg by mouth every 12 (twelve) hours as needed (pain).   Yes [provider]  apixaban (ELIQUIS) 5 MG TABS tablet Take 2 tablets (10 mg total) by mouth 2 (two) times daily. 11/21/16 11/28/16  Richarda Overlie, MD  apixaban (ELIQUIS) 5 MG TABS tablet Take 1 tablet (5 mg total) by mouth 2 (two) times daily. Patient not taking: Reported on 04/07/2017 11/29/16 11/29/17  Richarda Overlie, MD    Physical Exam: Vitals:   04/07/17 1111 04/07/17 1336 04/07/17 1344 04/07/17 1526  BP: (!) 154/61 (!) 144/70  (!) 116/53  Pulse: (!) 106 (!) 104  100  Resp: 20 (!) 28  (!) 21  Temp: 98 F (36.7 C)     TempSrc: Oral     SpO2: 99% 98%  100%  Weight:   (!) 140.6 kg (310 lb)   Height:   6\' 3"  (1.905 m)     Constitutional: NAD, calm, comfortable Vitals:   04/07/17 1111 04/07/17 1336 04/07/17 1344 04/07/17 1526  BP: (!) 154/61 (!) 144/70  (!) 116/53  Pulse: (!) 106 (!) 104  100  Resp: 20 (!) 28  (!) 21  Temp: 98 F (36.7 C)     TempSrc: Oral     SpO2: 99% 98%  100%  Weight:   (!) 140.6 kg (310 lb)    Height:   6\' 3"  (1.905 m)    Eyes: PERRL, lids and conjunctivae normal ENMT: Mucous membranes are moist. Posterior pharynx clear of any exudate or lesions.Normal dentition.  Neck: normal, supple, no masses, no thyromegaly Respiratory: clear to auscultation bilaterally, no wheezing, no crackles. Normal respiratory effort. No accessory muscle use.  Cardiovascular: Regular rate and rhythm, no murmurs / rubs / gallops. No extremity edema. 2+ pedal pulses. No carotid bruits.  Abdomen: no tenderness, no masses palpated. No hepatosplenomegaly. Bowel sounds positive.  Musculoskeletal: no clubbing / cyanosis. No joint deformity upper and lower extremities. Good ROM, no contractures. Normal muscle tone.  Skin: no rashes, lesions, ulcers. No induration Neurologic: CN 2-12 grossly intact. Sensation intact, DTR normal. Strength 5/5 in all 4.  Psychiatric: Normal judgment and insight. Alert and oriented x 3. Normal mood.    Labs on Admission: I have personally reviewed following labs and imaging studies  CBC:  Recent Labs Lab 04/07/17 1204  WBC 12.4*  NEUTROABS 11.2*  HGB 14.9  HCT 44.2  MCV 90.2  PLT 241   Basic Metabolic Panel:  Recent Labs Lab 04/07/17 1204  NA 137  K 4.1  CL 99*  CO2 17*  GLUCOSE 291*  BUN 19  CREATININE 1.37*  CALCIUM 9.6   GFR: Estimated Creatinine Clearance: 82.4 mL/min (A) (by C-G formula based on SCr of 1.37 mg/dL (H)). Liver Function Tests:  Recent Labs Lab 04/07/17 1204  AST 21  ALT 20  ALKPHOS 63  BILITOT 1.5*  PROT 8.1  ALBUMIN 3.9    Recent Labs Lab 04/07/17 1204  LIPASE 17   Coagulation Profile:  Recent Labs Lab 04/07/17 1204  INR 1.02   Cardiac Enzymes:  Recent Labs Lab 04/07/17 1204  TROPONINI 0.32*   CBG:  Recent Labs Lab 04/07/17 1206  GLUCAP 280*    Urine analysis:    Component Value Date/Time   COLORURINE YELLOW 04/07/2017 1204   APPEARANCEUR CLEAR 04/07/2017 1204   LABSPEC 1.027 04/07/2017 1204    PHURINE 5.0 04/07/2017 1204   GLUCOSEU >=500 (A) 04/07/2017 1204   HGBUR SMALL (A) 04/07/2017 1204   BILIRUBINUR NEGATIVE 04/07/2017 1204   KETONESUR 80 (A) 04/07/2017 1204   PROTEINUR 30 (A) 04/07/2017 1204   UROBILINOGEN 1.0 04/05/2015 1155   NITRITE NEGATIVE 04/07/2017 1204   LEUKOCYTESUR NEGATIVE 04/07/2017 1204   Radiological Exams on Admission: Ct Angio Chest Pe W/cm &/or Wo Cm  Result Date: 04/07/2017 CLINICAL DATA:  66 year old male with acute shortness of breath and chest pain. EXAM: CT ANGIOGRAPHY CHEST WITH CONTRAST TECHNIQUE: Multidetector CT imaging of the chest was performed using the standard protocol during bolus administration of intravenous contrast. Multiplanar CT image reconstructions and MIPs were obtained to evaluate the vascular anatomy. CONTRAST:  100 cc intravenous Isovue 370 COMPARISON:  11/18/2016 and prior chest CTs. 04/07/2017 chest radiograph FINDINGS: Cardiovascular: This is a technically adequate study although motion artifact in the lower lungs decreases sensitivity. No pulmonary emboli are identified. Upper limits normal heart size noted with heavy coronary artery calcifications. Thoracic aortic atherosclerotic calcifications noted without aneurysm. No pericardial effusion. Mediastinum/Nodes: No enlarged mediastinal, hilar, or axillary lymph nodes. Thyroid gland, trachea, and esophagus demonstrate no significant findings. Lungs/Pleura: Mild subsegmental atelectasis/ scarring within the mid-lower right lung and left lung base again noted. No airspace disease, consolidation, nodule, mass, pleural effusion or pneumothorax noted. Upper Abdomen: No acute abnormality Musculoskeletal: No chest wall abnormality. No acute or significant osseous findings. Review of the MIP images confirms the above findings. IMPRESSION: 1. No evidence of acute abnormality. No evidence of pulmonary emboli. 2. Upper limits normal heart size and coronary artery disease 3. Mild subsegmental  atelectasis/scarring within the mid and lower lungs. 4.  Aortic Atherosclerosis (ICD10-I70.0). Electronically Signed   By: Harmon PierJeffrey  Hu M.D.   On: 04/07/2017 15:23   Dg Chest Port 1 View  Result Date: 04/07/2017 CLINICAL DATA:  Chest pain. EXAM: PORTABLE CHEST 1 VIEW COMPARISON:  February 19, 2017 FINDINGS: The heart size and mediastinal contours are within normal limits. Both lungs are clear. The visualized skeletal structures are unremarkable. IMPRESSION: No active disease. Electronically Signed   By: Gerome Samavid  Williams III M.D   On: 04/07/2017 13:50    EKG: Independently reviewed.  Sinus tach no acute issues Old chart reviewed Case discussed with dr Pecola Leisurereese in ed  Assessment/Plan 66 yo male with h/o DM, HTN, Obesity, PE not anticoagulated due to cost issues, with NSTEMI  Principal Problem:   NSTEMI (non-ST elevated myocardial infarction) (HCC)- ntg sl helped earlier relief his discomfort.  Place ntg paste now.  bp soft so will not add bblocker at this time.  Heparin drip.  Aspirin daily.  Ck hga1c and flp in the am.  Give morphine.  [facility] to [facility] and have nursing staff call cardiology off pt arrival to [facility] for likely cath at some point.  Serial troponin.    Active Problems:   NAUSEA AND VOMITING- prn zofran ordered   Pulmonary embolism (HCC)- pt cannot afford eloquis, this will need to be addressed prior to d/c   Coronary artery disease-cath in 2010 with chronic uncontrolled dm likely   Gastroparesis- cont his reglan   Diabetes mellitus type 2, uncontrolled (HCC)- give reg insulin 15 units now, ck glucose q 2 hours, cont long acting.  If glucose does not improve consider insulin drip    DVT prophylaxis:  On hep drip Code Status:  full Family Communication:  none Disposition Plan:  Per day team Consults called:  cardiology Admission status:   admission   Bonifacio Pruden A MD [facility]  If 7PM-7AM, please contact night-coverage [email or web address] Password [email or web address]  04/07/2017,  4:31 PM

## 2017-04-07 NOTE — Progress Notes (Signed)
ANTICOAGULATION CONSULT NOTE  Pharmacy Consult for Heparin Indication: chest pain/ACS  Allergies  Allergen Reactions  . Codeine Other (See Comments)    REACTION: GI Upset  . Gabapentin Hives, Itching and Rash    REACTION: rash/hives (per patient, was a reaction to an inactive ingredient in another mgf brand). The patient stated that he does take this medication now and it doesn't cause a rash any more.  . Nabumetone Itching, Rash and Other (See Comments)    REACTION: sick on stomach and rash    Patient Measurements: Height: 6\' 3"  (190.5 cm) Weight: 281 lb 4.8 oz (127.6 kg) IBW/kg (Calculated) : 84.5 Heparin Dosing Weight: 116 kg   Vital Signs: Temp: 98.3 F (36.8 C) (10/21 2000) Temp Source: Oral (10/21 2000) BP: 118/61 (10/21 2000) Pulse Rate: 101 (10/21 2000)  Labs:  Recent Labs  04/07/17 1204 04/07/17 1838 04/07/17 2022  HGB 14.9  --   --   HCT 44.2  --   --   PLT 241  --   --   APTT 28  --   --   LABPROT 13.3  --   --   INR 1.02  --   --   HEPARINUNFRC  --   --  0.58  CREATININE 1.37*  --   --   TROPONINI 0.32* 0.75*  --     Estimated Creatinine Clearance: 78.4 mL/min (A) (by C-G formula based on SCr of 1.37 mg/dL (H)).   Assessment: [age] yoM presented to ED on 10/21 with N/V consistent with previous c/o gastroparesis.  Pharmacy is now consulted to dose Heparin for r/o ACS.  Initial heparin level therapeutic at 0.58   Goal of Therapy:  Heparin level 0.3-0.7 units/ml Monitor platelets by anticoagulation protocol: Yes   Plan:  Continue heparin at 1500 units / hr Daily heparin level and CBC Continue to monitor H&H and platelets  Thank you Okey RegalLisa Ailis Rigaud, PharmD [phone] 04/07/2017 9:11 PM

## 2017-04-07 NOTE — Progress Notes (Signed)
Dr. Cristal Deerhristopher  Visiting patient

## 2017-04-07 NOTE — ED Triage Notes (Signed)
Brought in by EMS from home with c/o emesis, onset yesterday afternoon.  Pt denies abdominal pain.  Pt was given Zofran 4 mg IV on scene.

## 2017-04-07 NOTE — Progress Notes (Addendum)
Patient arrived the unit via [facility] , vital signs obtained,pt c/o chest pain 6/10, 1 nitro SL given, continues on heparin drip at 15mls /hr. placed on tele ccmd notified,Christopher MD notified of patient's arrival to the unit, bed in lowest position , call bell within reach will continue to monitor

## 2017-04-07 NOTE — ED Notes (Signed)
Pt wanted to go home with his family at this time.

## 2017-04-07 NOTE — Progress Notes (Addendum)
Chest pain decreased to 4/10 , patient refused another niro, c/o SOB placed on 2L oxygen, PRN zofran given for nausea, patient resting quietly in bed, call bell within reach will continue to monitor.

## 2017-04-07 NOTE — Consult Note (Signed)
CARDIOLOGY CONSULT NOTE   Referring Physician: Dr. Onalee Hua Primary Physician: Dr. Selena Batten Primary Cardiologist: Dr. Allyson Sabal Reason for Consultation: NSTEMI   HPI: Samuel [name] is a 66 yo man with PMH CAD s/p mid-LAD stent 2010, insulin dependent DM2, prior PE, GERD, OSA, HTN who presented with epigastric discomfort. He is seen in consult at the request of Dr. Onalee Hua for management of his NSTEMI. The patient denies clear chest pain or discomfort, stating instead that he has been nauseated since the morning of 10/20. He felt a "heartburn" type sensation, and he denies radiation, SOB, or diaphoresis. The evening of 10/20 he began vomiting bitter, dark green emesis, and he has remained ill since that time. He came to the ER because he was concerned he had the flu, but his troponin was found to be elevated at that time. He denies any prior symptoms similar to this, and he does not remember all of his symptoms prior to his cath in 2010, but "it wasn't like this." Phenergan at [facility] helped his symptoms, but otherwise nothing has alleviated them. He thinks nitro helped very little if at all and declines additional doses.  Per Dr. Hazle Coca note in 2014: "Status post stenting of his mid LAD with a drug-eluting stent 06/14/09 done via the right radial approach. He had noncritical disease otherwise with normal LV function. He had a Myoview stress test performed 08/24/09 which was low risk and nonischemic. He denies chest pain or shortness of breath."   Review of Systems:     Cardiac Review of Systems: {Y] = yes [ ]  = no  Chest Pain [  Epigastric discomfort  ]  Resting SOB [ N  ] Exertional SOB  Klaus.Mock  ]  Pollyann Kennedy Klaus.Mock  ]   Pedal Edema [  N ]    Palpitations [ N ] Syncope  Klaus.Mock  ]   Presyncope [ N  ]  General Review of Systems: [Y] = yes [  ]=no Constitional: recent weight change [Y  ]; anorexia [  ]; fatigue [Y  ]; nausea [  ]; night sweats [  ]; fever [  ]; or chills [  ];                                                                      Eyes : blurred vision [  ]; diplopia [   ]; vision changes [  ];  Amaurosis fugax[  ]; Resp: cough [  ];  wheezing[  ];  hemoptysis[  ];  PND [  ];  GI:  gallstones[  ], vomiting[Y  ];  dysphagia[  ]; melena[  ];  hematochezia [  ]; heartburn[ Y ];   GU: kidney stones [  ]; hematuria[  ];   dysuria [  ];  nocturia[  ]; incontinence [  ];             Skin: rash, swelling[  ];, hair loss[  ];  peripheral edema[  ];  or itching[  ]; Musculosketetal: myalgias[  ];  joint swelling[  ];  joint erythema[  ];  joint pain[  ];  back pain[  ];  Heme/Lymph: bruising[  ];  bleeding[  ];  anemia[  ];  Neuro: TIA[  ];  headaches[  ];  stroke[  ];  vertigo[  ];  seizures[  ];   paresthesias[  ];  difficulty walking[  ];  Psych:depression[  ]; anxiety[  ];  Endocrine: diabetes[  ];  thyroid dysfunction[  ];  Other:  Past Medical History:  Diagnosis Date  . Anxiety   . Arthritis    "all over"   . CAD (coronary artery disease)   . Charcot's joint    "left foot"  . 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 Depression   . Gastroparesis   . GERD (gastroesophageal reflux disease)   . H/O hiatal hernia   . Hyperlipidemia   . Hypertension   . OSA (obstructive sleep apnea)    "not bad enough for a mask"  . Peripheral neuropathy   . Peripheral vascular disease (HCC)   . PONV (postoperative nausea and vomiting)   . Pulmonary embolism (HCC)    hx. of 2012  . Shortness of breath    exertion  . Type II diabetes mellitus (HCC)     Medications Prior to Admission  Medication Sig Dispense Refill  . amLODipine (NORVASC) 10 MG tablet Take 1 tablet (10 mg total) by mouth daily. 30 tablet 2  . Canagliflozin (INVOKANA) 100 MG TABS Take 100 mg by mouth See admin instructions. invokana 100 mg daily. Alternating every other week between [place]    . diphenhydramine-acetaminophen (TYLENOL PM) 25-500 MG TABS tablet Take 1 tablet by mouth at bedtime.    .  docusate sodium (COLACE) 100 MG capsule Take 100 mg by mouth daily as needed (for constipation).     [name] empagliflozin (JARDIANCE) 10 MG TABS tablet Take 10 mg by mouth See admin instructions. Jardiance 10 mg daily.  Alternating every other week between [place]    . ferrous sulfate 325 (65 FE) MG tablet Take 325 mg by mouth every evening.     . gabapentin (NEURONTIN) 300 MG capsule Take 300 mg by mouth 3 (three) times daily.    . insulin detemir (LEVEMIR) 100 UNIT/ML injection Inject 85 Units into the skin 2 (two) times daily.     . insulin lispro (HUMALOG) 100 UNIT/ML injection Inject 0-10 Units into the skin 3 (three) times daily with meals. Inject SQ per sliding scale 150-250 (5 units), 251-350(8 units), 351-450(10 units), >450=10 units    . lansoprazole (PREVACID) 30 MG capsule Take 30 mg by mouth 2 (two) times daily.  12  . metoCLOPramide (REGLAN) 10 MG tablet Take 10 mg by mouth at bedtime.     . pioglitazone (ACTOS) 30 MG tablet Take 30 mg by mouth at bedtime.     . polyethylene glycol (MIRALAX / GLYCOLAX) packet Take 17 g by mouth daily. (Patient taking differently: Take 17 g by mouth daily as needed for moderate constipation. ) 14 each 0  . prochlorperazine (COMPAZINE) 10 MG tablet Take 10 mg by mouth daily as needed for nausea/vomiting.  0  . promethazine (PHENERGAN) 25 MG suppository Place 25 mg rectally every 6 (six) hours as needed for nausea or vomiting.    . promethazine (PHENERGAN) 25 MG tablet Take 25 mg by mouth every 8 (eight) hours as needed for nausea or vomiting. Take one tablet by mouth every 8 hours as needed nausea/vomiting    . quinapril (ACCUPRIL) 5 MG tablet Take 5 mg by mouth daily.  12  . ranitidine (ZANTAC) 150 MG tablet Take 150 mg by  mouth at bedtime.     . simvastatin (ZOCOR) 40 MG tablet Take 60 mg by mouth daily at 6 PM. Take one and half tablet by mouth once daily in the evening for cholesterol    . traMADol (ULTRAM) 50 MG tablet Take 100 mg by mouth  every 12 (twelve) hours as needed (pain).    [name]. apixaban (ELIQUIS) 5 MG TABS tablet Take 2 tablets (10 mg total) by mouth 2 (two) times daily. 14 tablet 0  . apixaban (ELIQUIS) 5 MG TABS tablet Take 1 tablet (5 mg total) by mouth 2 (two) times daily. (Patient not taking: Reported on 04/07/2017) 60 tablet 0     . amLODipine  10 mg Oral Daily  . aspirin EC  81 mg Oral Daily  . atorvastatin  30 mg Oral q1800  . gabapentin  300 mg Oral TID  . insulin aspart  0-9 Units Subcutaneous Q4H  . insulin detemir  85 Units Subcutaneous BID  . metoCLOPramide  10 mg Oral QHS  . nitroGLYCERIN  0.5 inch Topical Q6H  . sodium chloride flush  3 mL Intravenous Q12H    Infusions: . sodium chloride    . heparin 1,500 Units/hr (04/07/17 2300)    Allergies  Allergen Reactions  . Codeine Other (See Comments)    REACTION: GI Upset  . Gabapentin Hives, Itching and Rash    REACTION: rash/hives (per patient, was a reaction to an inactive ingredient in another mgf brand). The patient stated that he does take this medication now and it doesn't cause a rash any more.  . Nabumetone Itching, Rash and Other (See Comments)    REACTION: sick on stomach and rash    Social History   Social History  . Marital status: Single    Spouse name: N/A  . Number of children: N/A  . Years of education: N/A   Occupational History  . DISABLITY Unemployed   Social History Main Topics  . Smoking status: Never Smoker  . Smokeless tobacco: Never Used  . Alcohol use Yes     Comment: 01/07/2014 'might have a wine cooler a couple times/yr"  . Drug use: No  . Sexual activity: No   Other Topics Concern  . Not on file   Social History Narrative  . No narrative on file    Family History  Problem Relation Age of Onset  . COPD Mother   . Heart disease Mother   . Lung cancer Mother   . Retinal detachment Father   . Alcoholism Brother   . Heart disease Brother   . COPD Brother   . Diabetes Brother   . Hypertension  Brother   . Stroke Brother     PHYSICAL EXAM: Vitals:   04/08/17 0000 04/08/17 0030  BP:  (!) 148/72  Pulse: 96 99  Resp: 14 (!) 22  Temp:  98.2 F (36.8 C)  SpO2: 100% 98%     Intake/Output Summary (Last 24 hours) at 04/07/17 2215 Last data filed at 04/07/17 1900  Gross per 24 hour  Intake             1000 ml  Output              300 ml  Net              700 ml    General:  Well nourished, uncomfortable appearing man with emesis bag in hand HEENT: normal Neck: supple. Could not appreciate JVD Carotids 2+ bilat. Cor: PMI  nondisplaced. Regular rate & rhythm. No rubs, gallops or murmurs. Lungs: clear Abdomen: soft, mildly tender epigastrium, nondistended.  Extremities: no cyanosis, clubbing, rash, edema Neuro: alert & oriented x 3, cranial nerves grossly intact. moves all 4 extremities w/o difficulty.   ECG: initial sinus tach. Repeat sinus rhythm with <18mm STE in inferior leads from TP segments  Results for orders placed or performed during the hospital encounter of 04/07/17 (from the past 24 hour(s))  Comprehensive metabolic panel     Status: Abnormal   Collection Time: 04/07/17 12:04 PM  Result Value Ref Range   Sodium 137 135 - 145 mmol/L   Potassium 4.1 3.5 - 5.1 mmol/L   Chloride 99 (L) 101 - 111 mmol/L   CO2 17 (L) 22 - 32 mmol/L   Glucose, Bld 291 (H) 65 - 99 mg/dL   BUN 19 6 - 20 mg/dL   Creatinine, Ser 1.61 (H) 0.61 - 1.24 mg/dL   Calcium 9.6 8.9 - 09.6 mg/dL   Total Protein 8.1 6.5 - 8.1 g/dL   Albumin 3.9 3.5 - 5.0 g/dL   AST 21 15 - 41 U/L   ALT 20 17 - 63 U/L   Alkaline Phosphatase 63 38 - 126 U/L   Total Bilirubin 1.5 (H) 0.3 - 1.2 mg/dL   GFR calc non Af Amer 53 (L) >60 mL/min   GFR calc Af Amer >60 >60 mL/min  CBC with Differential     Status: Abnormal   Collection Time: 04/07/17 12:04 PM  Result Value Ref Range   WBC 12.4 (H) 4.0 - 10.5 K/uL   RBC 4.90 4.22 - 5.81 MIL/uL   Hemoglobin 14.9 13.0 - 17.0 g/dL   HCT 04.5 40.9 - 81.1 %   MCV  90.2 78.0 - 100.0 fL   MCH 30.4 26.0 - 34.0 pg   MCHC 33.7 30.0 - 36.0 g/dL   RDW 91.4 78.2 - 95.6 %   Platelets 241 150 - 400 K/uL   Neutrophils Relative % 91 %   Neutro Abs 11.2 (H) 1.7 - 7.7 K/uL   Lymphocytes Relative 6 %   Lymphs Abs 0.8 0.7 - 4.0 K/uL   Monocytes Relative 3 %   Monocytes Absolute 0.4 0.1 - 1.0 K/uL   Eosinophils Relative 0 %   Eosinophils Absolute 0.0 0.0 - 0.7 K/uL   Basophils Relative 0 %   Basophils Absolute 0.0 0.0 - 0.1 K/uL  Urinalysis, Routine w reflex microscopic     Status: Abnormal   Collection Time: 04/07/17 12:04 PM  Result Value Ref Range   Color, Urine YELLOW YELLOW   APPearance CLEAR CLEAR   Specific Gravity, Urine 1.027 1.005 - 1.030   pH 5.0 5.0 - 8.0   Glucose, UA >=500 (A) NEGATIVE mg/dL   Hgb urine dipstick SMALL (A) NEGATIVE   Bilirubin Urine NEGATIVE NEGATIVE   Ketones, ur 80 (A) NEGATIVE mg/dL   Protein, ur 30 (A) NEGATIVE mg/dL   Nitrite NEGATIVE NEGATIVE   Leukocytes, UA NEGATIVE NEGATIVE   RBC / HPF 0-5 0 - 5 RBC/hpf   WBC, UA 0-5 0 - 5 WBC/hpf   Bacteria, UA NONE SEEN NONE SEEN   Squamous Epithelial / LPF NONE SEEN NONE SEEN   Mucus PRESENT   Lipase, blood     Status: None   Collection Time: 04/07/17 12:04 PM  Result Value Ref Range   Lipase 17 11 - 51 U/L  Troponin I     Status: Abnormal   Collection Time: 04/07/17 12:04  PM  Result Value Ref Range   Troponin I 0.32 (HH) <0.03 ng/mL  Protime-INR     Status: None   Collection Time: 04/07/17 12:04 PM  Result Value Ref Range   Prothrombin Time 13.3 11.4 - 15.2 seconds   INR 1.02   APTT     Status: None   Collection Time: 04/07/17 12:04 PM  Result Value Ref Range   aPTT 28 24 - 36 seconds  CBG monitoring, ED     Status: Abnormal   Collection Time: 04/07/17 12:06 PM  Result Value Ref Range   Glucose-Capillary 280 (H) 65 - 99 mg/dL  I-stat troponin, ED     Status: Abnormal   Collection Time: 04/07/17 12:18 PM  Result Value Ref Range   Troponin i, poc 0.26 (HH) 0.00  - 0.08 ng/mL   Comment NOTIFIED PHYSICIAN    Comment 3          CBG monitoring, ED     Status: Abnormal   Collection Time: 04/07/17  5:00 PM  Result Value Ref Range   Glucose-Capillary 267 (H) 65 - 99 mg/dL  Hemoglobin Z6X     Status: Abnormal   Collection Time: 04/07/17  6:38 PM  Result Value Ref Range   Hgb A1c MFr Bld 9.6 (H) 4.8 - 5.6 %   Mean Plasma Glucose 228.82 mg/dL  Troponin I     Status: Abnormal   Collection Time: 04/07/17  6:38 PM  Result Value Ref Range   Troponin I 0.75 (HH) <0.03 ng/mL  Heparin level (unfractionated)     Status: None   Collection Time: 04/07/17  8:22 PM  Result Value Ref Range   Heparin Unfractionated 0.58 0.30 - 0.70 IU/mL  Glucose, capillary     Status: Abnormal   Collection Time: 04/07/17  8:45 PM  Result Value Ref Range   Glucose-Capillary 206 (H) 65 - 99 mg/dL   Ct Angio Chest Pe W/cm &/or Wo Cm  Result Date: 04/07/2017 CLINICAL DATA:  66 year old male with acute shortness of breath and chest pain. EXAM: CT ANGIOGRAPHY CHEST WITH CONTRAST TECHNIQUE: Multidetector CT imaging of the chest was performed using the standard protocol during bolus administration of intravenous contrast. Multiplanar CT image reconstructions and MIPs were obtained to evaluate the vascular anatomy. CONTRAST:  100 cc intravenous Isovue 370 COMPARISON:  11/18/2016 and prior chest CTs. 04/07/2017 chest radiograph FINDINGS: Cardiovascular: This is a technically adequate study although motion artifact in the lower lungs decreases sensitivity. No pulmonary emboli are identified. Upper limits normal heart size noted with heavy coronary artery calcifications. Thoracic aortic atherosclerotic calcifications noted without aneurysm. No pericardial effusion. Mediastinum/Nodes: No enlarged mediastinal, hilar, or axillary lymph nodes. Thyroid gland, trachea, and esophagus demonstrate no significant findings. Lungs/Pleura: Mild subsegmental atelectasis/ scarring within the mid-lower right lung  and left lung base again noted. No airspace disease, consolidation, nodule, mass, pleural effusion or pneumothorax noted. Upper Abdomen: No acute abnormality Musculoskeletal: No chest wall abnormality. No acute or significant osseous findings. Review of the MIP images confirms the above findings. IMPRESSION: 1. No evidence of acute abnormality. No evidence of pulmonary emboli. 2. Upper limits normal heart size and coronary artery disease 3. Mild subsegmental atelectasis/scarring within the mid and lower lungs. 4.  Aortic Atherosclerosis (ICD10-I70.0). Electronically Signed   By: Harmon Pier M.D.   On: 04/07/2017 15:23   Dg Chest Port 1 View  Result Date: 04/07/2017 CLINICAL DATA:  Chest pain. EXAM: PORTABLE CHEST 1 VIEW COMPARISON:  February 19, 2017 FINDINGS: The  heart size and mediastinal contours are within normal limits. Both lungs are clear. The visualized skeletal structures are unremarkable. IMPRESSION: No active disease. Electronically Signed   By: Gerome Sam III M.D   On: 04/07/2017 13:50   ASSESSMENT/PLAN: Samuel [name] is a 66 yo man with PMH CAD s/p mid-LAD stent 2010, insulin dependent DM2, prior PE, GERD, OSA, HTN who presented with epigastric discomfort. He is seen in consult at the request of Dr. Onalee Hua for management of his NSTEMI.  Given his diabetes, suspect that he has atypical anginal symptoms, and nausea may be his equivalent. He declines nitroglycerin and prefers agents targeted to his nausea at this time. Troponins continue to rise, but he is hemodynamically stable.  NSTEMI: -heparin drip, aspirin 81 mg daily -suggest increasing to atorvastatin 80 mg daily -continue to trend troponins to peak -NPO after midnight for possible cath, will be assessed in AM -echocardiogram -can add beta blocker, restart ACEI once BP stable (initially soft on presentation)  A1c elevated at 9.6, diabetes management/gastroparesis handled by primary team. Not taking eliquis due to cost, will  need assistance/plan prior to discharge. Will need to review anticoagulation plan if DAPT needed post PCI  Jodelle Red, MD, PhD, overnight cardiology provider

## 2017-04-07 NOTE — ED Notes (Signed)
Caregiver contact [phone] Marchia MeiersPatricia Giley

## 2017-04-07 NOTE — Progress Notes (Signed)
Pt complaining of chest pain 3-10 pt on heparin gtt and nitro paste offered pt sublingual nitro and or morphine pt stated he didn't want anything will continue to monitor

## 2017-04-07 NOTE — ED Notes (Signed)
Bed: [id number] Expected date:  Expected time:  Means of arrival:  Comments: 66 m n/v

## 2017-04-07 NOTE — Progress Notes (Signed)
ANTICOAGULATION CONSULT NOTE - Initial Consult  Pharmacy Consult for Heparin Indication: chest pain/ACS  Allergies  Allergen Reactions  . Codeine Other (See Comments)    REACTION: GI Upset  . Gabapentin Hives, Itching and Rash    REACTION: rash/hives (per patient, was a reaction to an inactive ingredient in another mgf brand). The patient stated that he does take this medication now and it doesn't cause a rash any more.  . Nabumetone Itching, Rash and Other (See Comments)    REACTION: sick on stomach and rash    Patient Measurements: Height: 6\' 3"  (190.5 cm) Weight: (!) 310 lb (140.6 kg) IBW/kg (Calculated) : 84.5 Heparin Dosing Weight: 116 kg   Vital Signs: Temp: 98 F (36.7 C) (10/21 1111) Temp Source: Oral (10/21 1111) BP: 154/61 (10/21 1111) Pulse Rate: 106 (10/21 1111)  Labs:  Recent Labs  04/07/17 1204  HGB 14.9  HCT 44.2  PLT 241  CREATININE 1.37*  TROPONINI 0.32*    CrCl cannot be calculated (Unknown ideal weight.).   Medical History: Past Medical History:  Diagnosis Date  . Anxiety   . Arthritis    "all over"   . CAD (coronary artery disease)   . Charcot's joint    "left foot"  . 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 Depression   . Gastroparesis   . GERD (gastroesophageal reflux disease)   . H/O hiatal hernia   . Hyperlipidemia   . Hypertension   . OSA (obstructive sleep apnea)    "not bad enough for a mask"  . Peripheral neuropathy   . Peripheral vascular disease (HCC)   . PONV (postoperative nausea and vomiting)   . Pulmonary embolism (HCC)    hx. of 2012  . Shortness of breath    exertion  . Type II diabetes mellitus (HCC)     Medications:  Scheduled:  . heparin  4,000 Units Intravenous Once   Infusions:  . heparin      Assessment: [id number] yoM presented to ED on 10/21 with N/V consistent with previous c/o gastroparesis.  Pharmacy is now consulted to dose Heparin for r/o ACS.  PTA medications include:  Apixaban 5mg  PO BID for pulmonary embolism.  Pt reports not taking.  Last dose was several months ago.  IVC Filter placed 11/20/16.  Today, 04/07/2017: Baseline coags: pending CBC: Hgb 14.9, Plt 241 SCr 1.37 Troponin: 0.32, 0.26   Goal of Therapy:  Heparin level 0.3-0.7 units/ml Monitor platelets by anticoagulation protocol: Yes   Plan:  Baseline PTT, PT/INR Give heparin 4000 units bolus IV x 1 Start heparin IV infusion at 1500 units/hr  Heparin level 6 hours after starting  Daily heparin level and CBC Continue to monitor H&H and platelets   Lynann Beaverhristine Sol Englert PharmD, BCPS Pager [phone] 04/07/2017 1:27 PM

## 2017-04-07 NOTE — ED Provider Notes (Signed)
[facility] 4E CV SURGICAL PROGRESSIVE CARE Provider Note   CSN: [id number] Arrival date & time: 04/07/17  1017     History   Chief Complaint Chief Complaint  Patient presents with  . Emesis    HPI Samuel [name] is a 66 y.o. male.  The history is provided by the patient. No language interpreter was used.  Emesis      Samuel [name] is a 66 y.o. male who presents to the Emergency Department complaining of vomiting.  He presents via EMS for evaluation of vomiting. Symptoms started yesterday with nausea and vomiting. He reports too numerous episodes to count the emesis, bilious in nature. He has associated abdominal soreness. He is diabetic and is compliant with his diabetic medications.  He does reports some associated indigestion.  No fevers, dysuria, diarrhea, constipation. Symptoms are similar to prior episodes of gastroparesis.  Earlier this year he was diagnosed with a PE and had an IVC filter placed and was placed on Eliquis.  He has not been able to take the Eliquis due to cost issues.    Past Medical History:  Diagnosis Date  . Anxiety   . Arthritis    "all over"   . CAD (coronary artery disease)   . Charcot's joint    "left foot"  . 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 Depression   . Gastroparesis   . GERD (gastroesophageal reflux disease)   . H/O hiatal hernia   . Hyperlipidemia   . Hypertension   . OSA (obstructive sleep apnea)    "not bad enough for a mask"  . Peripheral neuropathy   . Peripheral vascular disease (HCC)   . PONV (postoperative nausea and vomiting)   . Pulmonary embolism (HCC)    hx. of 2012  . Shortness of breath    exertion  . Type II diabetes mellitus [facility])     Patient Active Problem List   Diagnosis Date Noted  . NSTEMI (non-ST elevated myocardial infarction) (HCC) 04/07/2017  . Osteomyelitis of foot (HCC)   . Protein-calorie malnutrition, severe (HCC) 03/08/2015  . Osteomyelitis of foot, acute (HCC) 03/07/2015  .  Sepsis (HCC) 03/07/2015  . Diabetes mellitus type 2, uncontrolled (HCC) 03/07/2015  . Normocytic normochromic anemia 03/07/2015  . Obesity (BMI 30-39.9) 03/07/2015  . Nausea & vomiting   . DM (diabetes mellitus), type 2 (HCC) 01/07/2014  . Chronic osteomyelitis involving lower leg (HCC) 01/07/2014  . Vomiting 12/22/2013  . Gastroparesis 12/21/2013  . Intractable nausea and vomiting 12/21/2013  . Coronary artery disease 05/01/2013  . Essential hypertension 05/01/2013  . Hyperlipidemia 05/01/2013  . Generalized weakness 03/20/2013  . Back pain 03/20/2013  . Pulmonary embolism (HCC) 03/27/2011  . PERFORATION OF GALLBLADDER 08/30/2010  . Ulcer of lower limb, unspecified 03/01/2010  . METHICILLIN SUSCEPTIBLE STAPH AUREUS SEPTICEMIA 01/25/2010  . Acute osteomyelitis, ankle and foot 01/25/2010  . NAUSEA AND VOMITING 01/25/2010  . GERD 11/09/2009  . DIABETES, TYPE 1 05/14/2008  . OBSTRUCTIVE SLEEP APNEA 04/30/2008  . ALLERGIC RHINITIS, SEASONAL 04/30/2008    Past Surgical History:  Procedure Laterality Date  . AMPUTATION Left 03/08/2015   Procedure:  LEFT AMPUTATION BELOW KNEE;  Surgeon: Toni Arthurs, MD;  Location: [facility] ORS;  Service: Orthopedics;  Laterality: Left;  . APPLICATION OF WOUND VAC Left 01/07/2014  . CHOLECYSTECTOMY  06/2010  . CORONARY ANGIOPLASTY WITH STENT PLACEMENT  05/2009   "1"  . FOOT SURGERY Left 2010   "for Charcot's joint"  .  HARDWARE REMOVAL Left 01/07/2014   Procedure: LEFT LEG REMOVAL OF DEEP IMPLANT AND SEQUESTRECTOMY; APPLICATION OF WOUND VAC ;  Surgeon: Toni Arthurs, MD;  Location: [facility] OR;  Service: Orthopedics;  Laterality: Left;  . I&D EXTREMITY Left "multiple"   leg  . 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 IM NAILING TIBIA Left ~ 2012  . SHOULDER ARTHROSCOPY W/ ROTATOR CUFF REPAIR Left    "and bone spurs"  .  TIBIAL IM ROD REMOVAL Left 01/07/2014  . TOE AMPUTATION Right ~ 2011   "great toe"  . VENA CAVA FILTER PLACEMENT  2012  . VENA CAVA FILTER PLACEMENT N/A 11/20/2016   Procedure: INSERTION VENA-CAVA FILTER;  Surgeon: Sherren Kerns, MD;  Location: [facility] OR;  Service: Vascular;  Laterality: N/A;  . WOUND DEBRIDEMENT Left 01/07/2014   "tibia"       Home Medications    Prior to Admission medications   Medication Sig Start Date End Date Taking? Authorizing Provider  amLODipine (NORVASC) 10 MG tablet Take 1 tablet (10 mg total) by mouth daily. 11/22/16  Yes Richarda Overlie, MD  Canagliflozin (INVOKANA) 100 MG TABS Take 100 mg by mouth See admin instructions. invokana 100 mg daily. Alternating every other week between Jardiance and Invokana   Yes [provider]  diphenhydramine-acetaminophen (TYLENOL PM) 25-500 MG TABS tablet Take 1 tablet by mouth at bedtime.   Yes [provider]  docusate sodium (COLACE) 100 MG capsule Take 100 mg by mouth daily as needed (for constipation).    Yes [provider]  empagliflozin (JARDIANCE) 10 MG TABS tablet Take 10 mg by mouth See admin instructions. Jardiance 10 mg daily.  Alternating every other week between Jardiance and Invokana   Yes [provider]  ferrous sulfate 325 (65 FE) MG tablet Take 325 mg by mouth every evening.    Yes [provider]  gabapentin (NEURONTIN) 300 MG capsule Take 300 mg by mouth 3 (three) times daily.   Yes [provider]  insulin detemir (LEVEMIR) 100 UNIT/ML injection Inject 85 Units into the skin 2 (two) times daily.    Yes [provider]  insulin lispro (HUMALOG) 100 UNIT/ML injection Inject 0-10 Units into the skin 3 (three) times daily with meals. Inject SQ per sliding scale 150-250 (5 units), 251-350(8 units), 351-450(10 units), >450=10 units   Yes [provider]  lansoprazole (PREVACID) 30 MG capsule Take 30 mg by mouth 2 (two) times daily. 03/19/17  Yes  [provider]  metoCLOPramide (REGLAN) 10 MG tablet Take 10 mg by mouth at bedtime.    Yes [provider]  pioglitazone (ACTOS) 30 MG tablet Take 30 mg by mouth at bedtime.    Yes [provider]  polyethylene glycol (MIRALAX / GLYCOLAX) packet Take 17 g by mouth daily. Patient taking differently: Take 17 g by mouth daily as needed for moderate constipation.  03/10/15  Yes Hollice Espy, MD  prochlorperazine (COMPAZINE) 10 MG tablet Take 10 mg by mouth daily as needed for nausea/vomiting. 04/01/17  Yes [provider]  promethazine (PHENERGAN) 25 MG suppository Place 25 mg rectally every 6 (six) hours as needed for nausea or vomiting.   Yes [provider]  promethazine (PHENERGAN) 25 MG tablet Take 25 mg by mouth every 8 (eight) hours as needed for nausea or vomiting. Take one tablet by mouth  every 8 hours as needed nausea/vomiting   Yes [provider]  quinapril (ACCUPRIL) 5 MG tablet Take 5 mg by mouth daily. 03/19/17  Yes [provider]  ranitidine (ZANTAC) 150 MG tablet Take 150 mg by mouth at bedtime.    Yes [provider]  simvastatin (ZOCOR) 40 MG tablet Take 60 mg by mouth daily at 6 PM. Take one and half tablet by mouth once daily in the evening for cholesterol   Yes [provider]  traMADol (ULTRAM) 50 MG tablet Take 100 mg by mouth every 12 (twelve) hours as needed (pain).   Yes [provider]  apixaban (ELIQUIS) 5 MG TABS tablet Take 2 tablets (10 mg total) by mouth 2 (two) times daily. 11/21/16 11/28/16  Richarda Overlie, MD  apixaban (ELIQUIS) 5 MG TABS tablet Take 1 tablet (5 mg total) by mouth 2 (two) times daily. Patient not taking: Reported on 04/07/2017 11/29/16 11/29/17  Richarda Overlie, MD    Family History Family History  Problem Relation Age of Onset  . COPD Mother   . Heart disease Mother   . Lung cancer Mother   . Retinal detachment Father   . Alcoholism Brother   . Heart  disease Brother   . COPD Brother   . Diabetes Brother   . Hypertension Brother   . Stroke Brother     Social History Social History  Substance Use Topics  . Smoking status: Never Smoker  . Smokeless tobacco: Never Used  . Alcohol use Yes     Comment: 01/07/2014 'might have a wine cooler a couple times/yr"     Allergies   Codeine; Gabapentin; and Nabumetone   Review of Systems Review of Systems  Gastrointestinal: Positive for vomiting.  All other systems reviewed and are negative.    Physical Exam Updated Vital Signs BP (!) 141/72 (BP Location: Right Arm)   Pulse (!) 103   Temp 98.8 F (37.1 C) (Oral)   Resp (!) 25   Ht 6\' 3"  (1.905 m)   Wt 127.6 kg (281 lb 4.8 oz)   SpO2 97%   BMI 35.16 kg/m   Physical Exam  Constitutional: He is oriented to person, place, and time. He appears well-developed and well-nourished.  Uncomfortable appearing. Disheveled.  HENT:  Head: Normocephalic and atraumatic.  Cardiovascular: Regular rhythm.   No murmur heard. Tachycardic  Pulmonary/Chest: Effort normal. No respiratory distress.  Abdominal: Soft. There is no rebound and no guarding.  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 2+ DP pulses in the right lower extremities. Multiple healing wounds to the right lower extremity with no significant erythema or edema.  Neurological: He is alert and oriented to person, place, and time.  Skin: Skin is warm and dry.  Psychiatric: He has a normal mood and affect. His behavior is normal.  Nursing note and vitals reviewed.    ED Treatments / Results  Labs (all labs ordered are listed, but only abnormal results are displayed) Labs Reviewed  COMPREHENSIVE METABOLIC PANEL - Abnormal; Notable for the following:       Result Value   Chloride 99 (*)    CO2 17 (*)    Glucose, Bld 291 (*)    Creatinine, Ser 1.37 (*)    Total Bilirubin 1.5 (*)    GFR calc non Af Amer  53 (*)    All other components within normal limits  CBC WITH DIFFERENTIAL/PLATELET - Abnormal; Notable for the  following:    WBC 12.4 (*)    Neutro Abs 11.2 (*)    All other components within normal limits  URINALYSIS, ROUTINE W REFLEX MICROSCOPIC - Abnormal; Notable for the following:    Glucose, UA >=500 (*)    Hgb urine dipstick SMALL (*)    Ketones, ur 80 (*)    Protein, ur 30 (*)    All other components within normal limits  TROPONIN I - Abnormal; Notable for the following:    Troponin I 0.32 (*)    All other components within normal limits  HEMOGLOBIN A1C - Abnormal; Notable for the following:    Hgb A1c MFr Bld 9.6 (*)    All other components within normal limits  CBG MONITORING, ED - Abnormal; Notable for the following:    Glucose-Capillary 280 (*)    All other components within normal limits  I-STAT TROPONIN, ED - Abnormal; Notable for the following:    Troponin i, poc 0.26 (*)    All other components within normal limits  CBG MONITORING, ED - Abnormal; Notable for the following:    Glucose-Capillary 267 (*)    All other components within normal limits  LIPASE, BLOOD  PROTIME-INR  APTT  LIPID PANEL  TROPONIN I  TROPONIN I  TROPONIN I  BASIC METABOLIC PANEL  CBC  HEPARIN LEVEL (UNFRACTIONATED)    EKG  EKG Interpretation  Date/Time:  Sunday April 07 2017 13:20:58 EDT Ventricular Rate:  106 PR Interval:    QRS Duration: 104 QT Interval:  349 QTC Calculation: 464 R Axis:   40 Text Interpretation:  Sinus tachycardia Nonspecific T abnormalities, lateral leads Confirmed by Tilden Fossaees, Jonathon Tan [phone]([id number]) on 04/07/2017 1:27:08 PM       Radiology Ct Angio Chest Pe W/cm &/or Wo Cm  Result Date: 04/07/2017 CLINICAL DATA:  66 year old male with acute shortness of breath and chest pain. EXAM: CT ANGIOGRAPHY CHEST WITH CONTRAST TECHNIQUE: Multidetector CT imaging of the chest was performed using the standard protocol during bolus administration of intravenous contrast.  Multiplanar CT image reconstructions and MIPs were obtained to evaluate the vascular anatomy. CONTRAST:  100 cc intravenous Isovue 370 COMPARISON:  11/18/2016 and prior chest CTs. 04/07/2017 chest radiograph FINDINGS: Cardiovascular: This is a technically adequate study although motion artifact in the lower lungs decreases sensitivity. No pulmonary emboli are identified. Upper limits normal heart size noted with heavy coronary artery calcifications. Thoracic aortic atherosclerotic calcifications noted without aneurysm. No pericardial effusion. Mediastinum/Nodes: No enlarged mediastinal, hilar, or axillary lymph nodes. Thyroid gland, trachea, and esophagus demonstrate no significant findings. Lungs/Pleura: Mild subsegmental atelectasis/ scarring within the mid-lower right lung and left lung base again noted. No airspace disease, consolidation, nodule, mass, pleural effusion or pneumothorax noted. Upper Abdomen: No acute abnormality Musculoskeletal: No chest wall abnormality. No acute or significant osseous findings. Review of the MIP images confirms the above findings. IMPRESSION: 1. No evidence of acute abnormality. No evidence of pulmonary emboli. 2. Upper limits normal heart size and coronary artery disease 3. Mild subsegmental atelectasis/scarring within the mid and lower lungs. 4.  Aortic Atherosclerosis (ICD10-I70.0). Electronically Signed   By: Harmon PierJeffrey  Hu M.D.   On: 04/07/2017 15:23   Dg Chest Port 1 View  Result Date: 04/07/2017 CLINICAL DATA:  Chest pain. EXAM: PORTABLE CHEST 1 VIEW COMPARISON:  February 19, 2017 FINDINGS: The heart size and mediastinal contours are within normal limits. Both lungs are clear. The visualized skeletal structures are unremarkable. IMPRESSION: No active disease. Electronically Signed   By: Onalee Huaavid  Mayford Knife III M.D   On: 04/07/2017 13:50    Procedures Procedures (including critical care time) CRITICAL CARE Performed by: Tilden Fossa   Total critical care time: 35  minutes  Critical care time was exclusive of separately billable procedures and treating other patients.  Critical care was necessary to treat or prevent imminent or life-threatening deterioration.  Critical care was time spent personally by me on the following activities: development of treatment plan with patient and/or surrogate as well as nursing, discussions with consultants, evaluation of patient's response to treatment, examination of patient, obtaining history from patient or surrogate, ordering and performing treatments and interventions, ordering and review of laboratory studies, ordering and review of radiographic studies, pulse oximetry and re-evaluation of patient's condition.   Medications Ordered in ED Medications  nitroGLYCERIN (NITROSTAT) SL tablet 0.4 mg (0.4 mg Sublingual Given 04/07/17 1852)  heparin ADULT infusion 100 units/mL (25000 units/263mL sodium chloride 0.45%) (1,500 Units/hr Intravenous Transfusing/Transfer 04/07/17 1654)  nitroGLYCERIN (NITROGLYN) 2 % ointment 0.5 inch (0.5 inches Topical Given 04/07/17 1641)  morphine 4 MG/ML injection 4 mg (not administered)  ondansetron (ZOFRAN) injection 4 mg (4 mg Intravenous Given 04/07/17 1907)  amLODipine (NORVASC) tablet 10 mg (10 mg Oral Given 04/07/17 1851)  insulin detemir (LEVEMIR) injection 85 Units (not administered)  gabapentin (NEURONTIN) capsule 300 mg (not administered)  metoCLOPramide (REGLAN) tablet 10 mg (not administered)  simvastatin (ZOCOR) tablet 60 mg (not administered)  insulin aspart (novoLOG) injection 0-9 Units (not administered)  sodium chloride flush (NS) 0.9 % injection 3 mL (not administered)  sodium chloride flush (NS) 0.9 % injection 3 mL (not administered)  0.9 %  sodium chloride infusion (not administered)  aspirin EC tablet 81 mg (not administered)  sodium chloride 0.9 % bolus 1,000 mL (0 mLs Intravenous Stopped 04/07/17 1320)  promethazine (PHENERGAN) injection 25 mg (25 mg Intravenous  Given 04/07/17 1203)  pantoprazole (PROTONIX) injection 40 mg (40 mg Intravenous Given 04/07/17 1320)  aspirin chewable tablet 324 mg (324 mg Oral Given 04/07/17 1320)  morphine 2 MG/ML injection 2 mg (2 mg Intravenous Given 04/07/17 1320)  heparin bolus via infusion 4,000 Units (4,000 Units Intravenous Bolus from Bag 04/07/17 1425)  iopamidol (ISOVUE-370) 76 % injection (  Contrast Given 04/07/17 1634)  iopamidol (ISOVUE-370) 76 % injection 100 mL (100 mLs Intravenous Contrast Given 04/07/17 1453)  morphine 2 MG/ML injection 2 mg (2 mg Intravenous Given 04/07/17 1539)  insulin aspart (novoLOG) injection 15 Units (15 Units Subcutaneous Given 04/07/17 1718)     Initial Impression / Assessment and Plan / ED Course  I have reviewed the triage vital signs and the nursing notes.  Pertinent labs & imaging results that were available during my care of the patient were reviewed by me and considered in my medical decision making (see chart for details).     Pt with hx/o DM, PE, CAD, gastroparesis in ED with N/V since yesterday, indigestion similar to prior episodes of gastroparesis.  EKG similar compared to priors but abnormal, troponin is elevated.  D/w Dr. Graciela Husbands with Cardiology - recommends CTA PE given recent PE with IVC filter, noncompliant with anticoagulation.  Pt was treated with ASA, nitro, morphine, heparin gtt - no significant change in sxs (mild indigestion, persistent nausea).  CTA negative for PE.  Discussed with Cardiology - recommends Medicine admit with Cards consult, prefer [facility] in event intervention is needed.  Hospitalist consulted for admission.  Patient updated of findings of studies and he is in agreement with admission for further  treatment.      Final Clinical Impressions(s) / ED Diagnoses   Final diagnoses:  NSTEMI (non-ST elevated myocardial infarction) [facility])    New Prescriptions Current Discharge Medication List       Tilden Fossa, MD 04/07/17 [phone]

## 2017-04-08 ENCOUNTER — Encounter (HOSPITAL_COMMUNITY)
Admission: EM | Disposition: A | Discharge status: Home Health | Payer: Self-pay | Source: Home / Self Care | Attending: Family Medicine

## 2017-04-08 DIAGNOSIS — I10 Essential (primary) hypertension: Secondary | ICD-10-CM

## 2017-04-08 DIAGNOSIS — I2510 Atherosclerotic heart disease of native coronary artery without angina pectoris: Secondary | ICD-10-CM

## 2017-04-08 DIAGNOSIS — Z9861 Coronary angioplasty status: Secondary | ICD-10-CM

## 2017-04-08 HISTORY — PX: LEFT HEART CATH AND CORONARY ANGIOGRAPHY: CATH118249

## 2017-04-08 LAB — CBC
HCT: 38.6 % — ABNORMAL LOW (ref 39.0–52.0)
HEMOGLOBIN: 13.1 g/dL (ref 13.0–17.0)
MCH: 30.5 pg (ref 26.0–34.0)
MCHC: 33.9 g/dL (ref 30.0–36.0)
MCV: 89.8 fL (ref 78.0–100.0)
PLATELETS: 227 10*3/uL (ref 150–400)
RBC: 4.3 MIL/uL (ref 4.22–5.81)
RDW: 13.2 % (ref 11.5–15.5)
WBC: 11.6 10*3/uL — AB (ref 4.0–10.5)

## 2017-04-08 LAB — BASIC METABOLIC PANEL
ANION GAP: 12 (ref 5–15)
BUN: 23 mg/dL — ABNORMAL HIGH (ref 6–20)
CHLORIDE: 105 mmol/L (ref 101–111)
CO2: 17 mmol/L — ABNORMAL LOW (ref 22–32)
CREATININE: 1.38 mg/dL — AB (ref 0.61–1.24)
Calcium: 8.8 mg/dL — ABNORMAL LOW (ref 8.9–10.3)
GFR calc non Af Amer: 53 mL/min — ABNORMAL LOW (ref >60)
Glucose, Bld: 136 mg/dL — ABNORMAL HIGH (ref 65–99)
Potassium: 3.1 mmol/L — ABNORMAL LOW (ref 3.5–5.1)
SODIUM: 134 mmol/L — AB (ref 135–145)

## 2017-04-08 LAB — GLUCOSE, CAPILLARY
GLUCOSE-CAPILLARY: 150 mg/dL — AB (ref 65–99)
GLUCOSE-CAPILLARY: 153 mg/dL — AB (ref 65–99)
GLUCOSE-CAPILLARY: 134 mg/dL — AB (ref 65–99)
GLUCOSE-CAPILLARY: 150 mg/dL — AB (ref 65–99)
Glucose-Capillary: 144 mg/dL — ABNORMAL HIGH (ref 65–99)
Glucose-Capillary: 152 mg/dL — ABNORMAL HIGH (ref 65–99)
Glucose-Capillary: 176 mg/dL — ABNORMAL HIGH (ref 65–99)

## 2017-04-08 LAB — LIPID PANEL
CHOLESTEROL: 140 mg/dL (ref 0–200)
HDL: 32 mg/dL — AB (ref >40)
LDL CALC: 89 mg/dL (ref 0–99)
Total CHOL/HDL Ratio: 4.4 RATIO
Triglycerides: 96 mg/dL (ref <150)
VLDL: 19 mg/dL (ref 0–40)

## 2017-04-08 LAB — HEPARIN LEVEL (UNFRACTIONATED): Heparin Unfractionated: 0.5 IU/mL (ref 0.30–0.70)

## 2017-04-08 LAB — TROPONIN I
Troponin I: 2.64 ng/mL (ref <0.03)
Troponin I: 5.4 ng/mL (ref <0.03)

## 2017-04-08 SURGERY — LEFT HEART CATH AND CORONARY ANGIOGRAPHY
Anesthesia: LOCAL

## 2017-04-08 MED ORDER — SODIUM CHLORIDE 0.9 % IV SOLN: 250.0000 mL | INTRAVENOUS | Status: DC | PRN

## 2017-04-08 MED ORDER — FENTANYL CITRATE (PF) 100 MCG/2ML IJ SOLN
INTRAMUSCULAR | Status: DC | PRN
  Administered 2017-04-08: 25 ug via INTRAVENOUS

## 2017-04-08 MED ORDER — SODIUM CHLORIDE 0.9 % IV SOLN
INTRAVENOUS | Status: AC
  Administered 2017-04-08: 20:00:00 via INTRAVENOUS

## 2017-04-08 MED ORDER — SODIUM CHLORIDE 0.9 % IV SOLN
INTRAVENOUS | Status: DC
  Administered 2017-04-08: 15:00:00 via INTRAVENOUS

## 2017-04-08 MED ORDER — SODIUM CHLORIDE 0.9% FLUSH
3.0000 mL | Freq: Two times a day (BID) | INTRAVENOUS | Status: DC
  Administered 2017-04-08: 3 mL via INTRAVENOUS

## 2017-04-08 MED ORDER — LIDOCAINE HCL 2 % IJ SOLN
INTRAMUSCULAR | Status: AC
  Filled 2017-04-08: qty 10

## 2017-04-08 MED ORDER — IOPAMIDOL (ISOVUE-370) INJECTION 76%
INTRAVENOUS | Status: DC | PRN
  Administered 2017-04-08: 80 mL via INTRA_ARTERIAL

## 2017-04-08 MED ORDER — ACETAMINOPHEN 325 MG PO TABS: 650.0000 mg | ORAL_TABLET | ORAL | Status: DC | PRN

## 2017-04-08 MED ORDER — ONDANSETRON HCL 4 MG/2ML IJ SOLN
4.0000 mg | Freq: Four times a day (QID) | INTRAMUSCULAR | Status: DC | PRN
  Administered 2017-04-08 – 2017-04-10 (×3): 4 mg via INTRAVENOUS
  Filled 2017-04-08 (×4): qty 2

## 2017-04-08 MED ORDER — SODIUM CHLORIDE 0.9% FLUSH: 3.0000 mL | INTRAVENOUS | Status: DC | PRN

## 2017-04-08 MED ORDER — POTASSIUM CHLORIDE CRYS ER 20 MEQ PO TBCR
40.0000 meq | EXTENDED_RELEASE_TABLET | Freq: Once | ORAL | Status: AC
  Administered 2017-04-08: 40 meq via ORAL
  Filled 2017-04-08: qty 2

## 2017-04-08 MED ORDER — HEPARIN SODIUM (PORCINE) 1000 UNIT/ML IJ SOLN
INTRAMUSCULAR | Status: DC | PRN
  Administered 2017-04-08: 6000 [IU] via INTRAVENOUS

## 2017-04-08 MED ORDER — POTASSIUM CHLORIDE CRYS ER 20 MEQ PO TBCR: 40.0000 meq | EXTENDED_RELEASE_TABLET | Freq: Once | ORAL | Status: DC

## 2017-04-08 MED ORDER — HEPARIN (PORCINE) IN NACL 2-0.9 UNIT/ML-% IJ SOLN
INTRAMUSCULAR | Status: AC
  Filled 2017-04-08: qty 1000

## 2017-04-08 MED ORDER — IOPAMIDOL (ISOVUE-370) INJECTION 76%
INTRAVENOUS | Status: AC
  Filled 2017-04-08: qty 100

## 2017-04-08 MED ORDER — LIDOCAINE HCL 2 % IJ SOLN
INTRAMUSCULAR | Status: DC | PRN
  Administered 2017-04-08: 2 mL

## 2017-04-08 MED ORDER — HEPARIN (PORCINE) IN NACL 2-0.9 UNIT/ML-% IJ SOLN
INTRAMUSCULAR | Status: AC | PRN
Start: 2017-04-08 — End: 2017-04-08
  Administered 2017-04-08: 1000 mL

## 2017-04-08 MED ORDER — MIDAZOLAM HCL 2 MG/2ML IJ SOLN
INTRAMUSCULAR | Status: AC
  Filled 2017-04-08: qty 2

## 2017-04-08 MED ORDER — HEPARIN (PORCINE) IN NACL 100-0.45 UNIT/ML-% IJ SOLN
1500.0000 [IU]/h | INTRAMUSCULAR | Status: AC
  Administered 2017-04-08 – 2017-04-10 (×3): 1500 [IU]/h via INTRAVENOUS
  Filled 2017-04-08 (×3): qty 250

## 2017-04-08 MED ORDER — METOPROLOL TARTRATE 12.5 MG HALF TABLET
12.5000 mg | ORAL_TABLET | Freq: Two times a day (BID) | ORAL | Status: DC
  Administered 2017-04-08 – 2017-04-10 (×5): 12.5 mg via ORAL
  Filled 2017-04-08 (×5): qty 1

## 2017-04-08 MED ORDER — POTASSIUM CHLORIDE 10 MEQ/100ML IV SOLN: 10.0000 meq | INTRAVENOUS | Status: DC

## 2017-04-08 MED ORDER — FENTANYL CITRATE (PF) 100 MCG/2ML IJ SOLN
INTRAMUSCULAR | Status: AC
Start: 2017-04-08 — End: 2017-04-08
  Filled 2017-04-08: qty 2

## 2017-04-08 MED ORDER — METOCLOPRAMIDE HCL 10 MG PO TABS
10.0000 mg | ORAL_TABLET | Freq: Three times a day (TID) | ORAL | Status: DC
  Filled 2017-04-08: qty 1

## 2017-04-08 MED ORDER — SODIUM CHLORIDE 0.9% FLUSH
3.0000 mL | Freq: Two times a day (BID) | INTRAVENOUS | Status: DC
  Administered 2017-04-10 – 2017-04-11 (×3): 3 mL via INTRAVENOUS

## 2017-04-08 MED ORDER — POTASSIUM CHLORIDE 10 MEQ/100ML IV SOLN
10.0000 meq | INTRAVENOUS | Status: AC
  Administered 2017-04-08 (×2): 10 meq via INTRAVENOUS
  Filled 2017-04-08 (×2): qty 100

## 2017-04-08 MED ORDER — HEPARIN SODIUM (PORCINE) 1000 UNIT/ML IJ SOLN
INTRAMUSCULAR | Status: AC
  Filled 2017-04-08: qty 1

## 2017-04-08 MED ORDER — ATORVASTATIN CALCIUM 80 MG PO TABS
80.0000 mg | ORAL_TABLET | Freq: Every day | ORAL | Status: DC
  Administered 2017-04-08 – 2017-04-11 (×4): 80 mg via ORAL
  Filled 2017-04-08 (×4): qty 1

## 2017-04-08 MED ORDER — VERAPAMIL HCL 2.5 MG/ML IV SOLN
INTRAVENOUS | Status: DC | PRN
  Administered 2017-04-08: 4 mL via INTRA_ARTERIAL

## 2017-04-08 MED ORDER — MIDAZOLAM HCL 2 MG/2ML IJ SOLN
INTRAMUSCULAR | Status: DC | PRN
  Administered 2017-04-08: 1 mg via INTRAVENOUS

## 2017-04-08 SURGICAL SUPPLY — 12 items

## 2017-04-08 NOTE — Care Management Note (Signed)
Case Management Note  Patient Details  Name: Samuel BusingJohn D [name] MRN: [id number] Date of Birth: 07/02/1950  Subjective/Objective:   Pt presented for Nstemi- Plan for Cardiac Cath 04-08-17. Pt continues on IV Heparin Gtt. PTA pt from home alone and has support of neighbors. PT has DME RW, Presenter, broadcastingCane and Wheel Chair. 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 Consult received for Medication Assistance.                                    Action/Plan: CM did speak with pt and he is not able to pay the co pay for Eliquis. Per pt his PCP office has filled out the Patient Assistance Application- it has been over a month. CM did reach out to MD Kim's Office to see where they stand in the process of application getting approved. Awaiting call back from the RN @ the office. [facility] to see if [facility] patient. CM did make the patient aware that his cardiologist may have samples of Eliquis. Per pt he had [name] for RN/PT Services- Agency had not been out due to co pay of $170.00. Pt states he is on a fixed income and pays $10.00 at a time when able. CM did [name] to check with Insurance. Pt will benefit from PT/OT consult and if [facility] for [facility] will need orders. CM will continue to f/u.   Expected Discharge Date:                  Expected Discharge Plan:  Home w Home Health Services  In-House Referral:  NA  Discharge planning Services  CM Consult  Post Acute Care Choice:  Home Health, Resumption of Svcs/PTA Provider Choice offered to:  Patient  DME Arranged:  N/A DME Agency:  NA  HH Arranged:  RN, PT HH Agency:  [name] ([facility]  Status of Service:  In process, will continue to follow  If discussed at Long Length of Stay Meetings, dates discussed:    Additional Comments:  Gala LewandowskyGraves-Bigelow, [place] Kaye, RN 04/08/2017, 1:01 PM

## 2017-04-08 NOTE — Progress Notes (Signed)
Progress Note  Patient Name: Samuel [name] Date of Encounter: 04/08/2017  Primary Cardiologist: Allyson Sabal - last seen in 2014 Requesting MD: Dr. Margot Ables  Subjective   Earlier this AM, had recurrent SSCP - relieved with NTG. Low grade Fever last PM - now Afebrile] Nausea improves  HE notes several months of DOE & PND/orthopnea - did not tell Dr. Allyson Sabal.  Inpatient Medications    Scheduled Meds: . amLODipine  10 mg Oral Daily  . aspirin EC  81 mg Oral Daily  . atorvastatin  30 mg Oral q1800  . gabapentin  300 mg Oral TID  . insulin aspart  0-9 Units Subcutaneous Q4H  . insulin detemir  85 Units Subcutaneous BID  . metoCLOPramide  10 mg Oral QHS  . metoprolol tartrate  12.5 mg Oral BID  . nitroGLYCERIN  0.5 inch Topical Q6H  . sodium chloride flush  3 mL Intravenous Q12H   Continuous Infusions: . sodium chloride    . heparin 1,500 Units/hr (04/08/17 0655)   PRN Meds: sodium chloride, morphine injection, nitroGLYCERIN, ondansetron (ZOFRAN) IV, sodium chloride flush   Vital Signs    Vitals:   04/08/17 0030 04/08/17 0230 04/08/17 0424 04/08/17 0843  BP: (!) 148/72 134/61  (!) 142/68  Pulse: 99 96 91 93  Resp: (!) 22 18 20 20   Temp: 98.2 F (36.8 C) (!) 100.5 F (38.1 C)  98.4 F (36.9 C)  TempSrc: Oral Axillary  Oral  SpO2: 98% 96% 96% 93%  Weight:   276 lb 14.4 oz (125.6 kg)   Height:        Intake/Output Summary (Last 24 hours) at 04/08/17 0939 Last data filed at 04/08/17 0655  Gross per 24 hour  Intake             1165 ml  Output              600 ml  Net              565 ml   Filed Weights   04/07/17 1344 04/07/17 1842 04/08/17 0424  Weight: (!) 310 lb (140.6 kg) 281 lb 4.8 oz (127.6 kg) 276 lb 14.4 oz (125.6 kg)    Telemetry    NSR 90s - Personally Reviewed  ECG    SR, 94 bpm ; stable subtle Inferior repolarization abnormality (~ STE - also noted in Sept 2018) - Personally Reviewed  Physical Exam   GEN: Obese, mildly disheveled. No acute  distress.   Neck:(difficult to assess JVP with beard & body habitus), no obvious carotid bruit Cardiac: RRR, no murmurs, rubs, or gallops.  Respiratory: Clear to auscultation bilaterally.=- mildly reduced basal breath sounds ; No W/R/R GI: Soft, nontender, non-distended ; obese MS: No edema; No deformity. 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 Psych: Normal affect   Labs    Chemistry Recent Labs Lab 04/07/17 1204 04/08/17 0707  NA 137 134*  K 4.1 3.1*  CL 99* 105  CO2 17* 17*  GLUCOSE 291* 136*  BUN 19 23*  CREATININE 1.37* 1.38*  CALCIUM 9.6 8.8*  PROT 8.1  --   ALBUMIN 3.9  --   AST 21  --   ALT 20  --   ALKPHOS 63  --   BILITOT 1.5*  --   GFRNONAA 53* 53*  GFRAA >60 >60  ANIONGAP  --  12     Hematology Recent  Labs Lab 04/07/17 1204 04/08/17 0707  WBC 12.4* 11.6*  RBC 4.90 4.30  HGB 14.9 13.1  HCT 44.2 38.6*  MCV 90.2 89.8  MCH 30.4 30.5  MCHC 33.7 33.9  RDW 12.9 13.2  PLT 241 227    Cardiac Enzymes Recent Labs Lab 04/07/17 1204 04/07/17 1838 04/08/17 0018 04/08/17 0707  TROPONINI 0.32* 0.75* 2.64* 5.40*    Recent Labs Lab 04/07/17 1218  TROPIPOC 0.26*     BNPNo results for input(s): BNP, PROBNP in the last 168 hours.   DDimer No results for input(s): DDIMER in the last 168 hours.   Radiology    Ct Angio Chest Pe W/cm &/or Wo Cm  Result Date: 04/07/2017 CLINICAL DATA:  66 year old male with acute shortness of breath and chest pain. EXAM: CT ANGIOGRAPHY CHEST WITH CONTRAST TECHNIQUE: Multidetector CT imaging of the chest was performed using the standard protocol during bolus administration of intravenous contrast. Multiplanar CT image reconstructions and MIPs were obtained to evaluate the vascular anatomy. CONTRAST:  100 cc intravenous Isovue 370 COMPARISON:  11/18/2016 and prior chest CTs. 04/07/2017 chest radiograph FINDINGS: Cardiovascular: This is a  technically adequate study although motion artifact in the lower lungs decreases sensitivity. No pulmonary emboli are identified. Upper limits normal heart size noted with heavy coronary artery calcifications. Thoracic aortic atherosclerotic calcifications noted without aneurysm. No pericardial effusion. Mediastinum/Nodes: No enlarged mediastinal, hilar, or axillary lymph nodes. Thyroid gland, trachea, and esophagus demonstrate no significant findings. Lungs/Pleura: Mild subsegmental atelectasis/ scarring within the mid-lower right lung and left lung base again noted. No airspace disease, consolidation, nodule, mass, pleural effusion or pneumothorax noted. Upper Abdomen: No acute abnormality Musculoskeletal: No chest wall abnormality. No acute or significant osseous findings. Review of the MIP images confirms the above findings. IMPRESSION: 1. No evidence of acute abnormality. No evidence of pulmonary emboli. 2. Upper limits normal heart size and coronary artery disease 3. Mild subsegmental atelectasis/scarring within the mid and lower lungs. 4.  Aortic Atherosclerosis (ICD10-I70.0). Electronically Signed   By: Harmon PierJeffrey  Hu M.D.   On: 04/07/2017 15:23   Dg Chest Port 1 View  Result Date: 04/07/2017 CLINICAL DATA:  Chest pain. EXAM: PORTABLE CHEST 1 VIEW COMPARISON:  February 19, 2017 FINDINGS: The heart size and mediastinal contours are within normal limits. Both lungs are clear. The visualized skeletal structures are unremarkable. IMPRESSION: No active disease. Electronically Signed   By: Gerome Samavid  Williams III M.D   On: 04/07/2017 13:50    Cardiac Studies   No current studies  Last Cath Report: 05/2009 - (Dr. Karilyn CotaBerry)mLAD ~90%, Cx: distal AVGroove 50%;co-dom RCA 40%mid, 75% pRPDA --> PCI of mLAD Xience DES 2.75 x 15 (3.0 mm post-dilation).   EF ~60%  Echo 11/2016: EF 60-65%, Gr 1 DD..  Mild RV dilation.  Patient Profile     66 y.o. male with medical history significant of CAD s/p stenting in 2010, DM,  PE quit taking eloquis due to cost issues, GERD, OSA, HTN comes in with 2 days of epigastric /sscp that does not radiate but associated with a lot of nausea and vomiting.  He has been feeling awful since Saturday 10/19 AM.  He thought he was coming down with the flu.  The pain is "pressure" in his chest and has become more persistent and is worse when he is vomiting.  No fevers.  No cough.  His glucose has been more elevated more than usual also with glucose over 300 and he hasnt been able to eat either.  Pt  found to have elevated troponin level and referred for admission for NSTEMI.   Assessment & Plan    Principal Problem:   NSTEMI (non-ST elevated myocardial infarction) (HCC) Active Problems:   CAD S/P DES PCI to mLAD: Xience DES 2.75 x 15 (3.0 mm)   Diabetes mellitus type 2, uncontrolled (HCC)   Essential hypertension   Hyperlipidemia due to type 2 diabetes mellitus (HCC)   DM (diabetes mellitus), type 2 (HCC)   NAUSEA AND VOMITING   Pulmonary embolism (HCC)   Gastroparesis  Admitting Sx are more c/w possible viral illness, but now with CP & NSTEMI by Troponin, I think that the stress of his illness may have triggered ACS.  Plan:   NSTEMI - continue IV Heparin, ASA & statin.  started low dose Beta Blocker (Metoprolol 12.5 mg BID) & continue amlodipine; Increase statin to 80mg .  Will try to get added on to Cath Board for today - would use Brilinta initially & consider changing to Plavix if unable to afford.  The procedure with Risks/Benefits/Alternatives and Indications was reviewed with the patient.  All questions were answered.    Risks / Complications include, but not limited to: Death, MI, CVA/TIA, VF/VT (with defibrillation), Bradycardia (need for temporary pacer placement), contrast induced nephropathy, bleeding / bruising / hematoma / pseudoaneurysm, vascular or coronary injury (with possible emergent CT or Vascular Surgery), adverse medication reactions, infection.  Additional  risks involving the use of radiation with the possibility of radiation burns and cancer were explained in detail.  The patient voices understanding and agree to proceed.     Had been on ACE-I in the past - reassess post-cath.  DM & Gastroparesis per [facility] Team.  For questions or updates, please contact [facility] Please consult [email or web address] for contact info under Cardiology/STEMI.      Signed, Bryan Lemma, MD  04/08/2017, 9:39 AM

## 2017-04-08 NOTE — Progress Notes (Signed)
Dr Hughie ClossBridgette notified of troponin of 0.73, no new orders given will continue to monitor

## 2017-04-08 NOTE — Interval H&P Note (Signed)
Cath Lab Visit (complete for each Cath Lab visit)  Clinical Evaluation Leading to the Procedure:   ACS: Yes.    Non-ACS:    Anginal Classification: CCS III  Anti-ischemic medical therapy: No Therapy  Non-Invasive Test Results: No non-invasive testing performed  Prior CABG: No previous CABG      History and Physical Interval Note:  04/08/2017 6:17 PM  Samuel [name]  has presented today for surgery, with the diagnosis of cp  The various methods of treatment have been discussed with the patient and family. After consideration of risks, benefits and other options for treatment, the patient has consented to  Procedure(s): LEFT HEART CATH AND CORONARY ANGIOGRAPHY (N/A) as a surgical intervention .  The patient's history has been reviewed, patient examined, no change in status, stable for surgery.  I have reviewed the patient's chart and labs.  Questions were answered to the patient's satisfaction.     Nanetta BattyBerry, Jorden Mahl

## 2017-04-08 NOTE — Consult Note (Signed)
   [facility] Inpatient Consult   04/08/2017  CAMERON SCHWINN March 15, 1951 [id number]  Referral received for [facility] services for medication needs. Patient is eligible for [facility] Care Management services under patient's [employer] plan.   Came by to speak with the patient but nursing working on IV access needs.  Will follow up with patient at a more appropriate time.  Follow up 1400: Met with the patient at the bedside.  Patient consents to [facility] for post hospital follow up needs.  Patient states he has cataracts and can barely see.  He describes many issues with afford his medications.  Consent form signed.  Folder with [facility] Management information and contacts given.  Will follow up for post hospital needs.    For questions, please contact:   Natividad Brood, RN BSN [facility] Liaison  [phone] business mobile phone Toll free office [phone]

## 2017-04-08 NOTE — Progress Notes (Signed)
ANTICOAGULATION CONSULT NOTE - Initial Consult  Pharmacy Consult for Heparin Indication: 2vCAD  Patient Measurements: Height: 6\' 3"  (190.5 cm) Weight: 276 lb 14.4 oz (125.6 kg) IBW/kg (Calculated) : 84.5 Heparin Dosing Weight: 112 kg  Vital Signs: Temp: 98.2 F (36.8 C) (10/22 1600) Temp Source: Oral (10/22 1600) BP: 125/77 (10/22 1841) Pulse Rate: 0 (10/22 1846)  Labs:  Recent Labs  04/07/17 1204 04/07/17 1838 04/07/17 2022 04/08/17 0018 04/08/17 0707  HGB 14.9  --   --   --  13.1  HCT 44.2  --   --   --  38.6*  PLT 241  --   --   --  227  APTT 28  --   --   --   --   LABPROT 13.3  --   --   --   --   INR 1.02  --   --   --   --   HEPARINUNFRC  --   --  0.58 0.50  --   CREATININE 1.37*  --   --   --  1.38*  TROPONINI 0.32* 0.75*  --  2.64* 5.40*    Estimated Creatinine Clearance: 77.2 mL/min (A) (by C-G formula based on SCr of 1.38 mg/dL (H)).   Assessment: Samuel [name] YOM who presented on 10/21 with ACS sx and underwent a cardiac cath today that revealed 2vCAD. Plans are to get a surgical evaluation. Pharmacy consulted to resume heparin 2 hours post sheath removal without a bolus.  Per the cath report, the sheath was removed at 1845. Will resume at the previously therapeutic rate.   Goal of Therapy:  Heparin level 0.3-0.7 units/ml Monitor platelets by anticoagulation protocol: Yes   Plan:  1. Restart Heparin at a rate of 1500 units/hr (15 ml/hr) starting at 2100 2. Will continue to monitor for any signs/symptoms of bleeding and will follow up with heparin level in the a.m.   Thank you for allowing pharmacy to be a part of this patient's care.  Georgina PillionElizabeth Ranvir Renovato, PharmD, BCPS Clinical Pharmacist Pager: [phone] If after 3:30p, please call main pharmacy at: [phone] 04/08/2017 7:11 PM

## 2017-04-08 NOTE — Progress Notes (Signed)
PROGRESS NOTE    Samuel BusingJohn D [name]  UJW:[id number] DOB: 1951/06/01 DOA: 04/07/2017 PCP: Pearson GrippeKim, James, MD    Brief Narrative:  Samuel [name] is a 66 yo man with PMH CAD s/p mid-LAD stent 2010, insulin dependent DM2, prior PE, GERD, OSA, HTN who presented with epigastric discomfort. Presented w/ complaints of n/v and "heartburn." No associated SOB, palpitations or radiation of chest discomfort at time of admission. H/o Cath in 2010 w/ stents placed.   Assessment & Plan:   Principal Problem:   NSTEMI (non-ST elevated myocardial infarction) (HCC) Active Problems:   NAUSEA AND VOMITING   Pulmonary embolism (HCC)   CAD S/P DES PCI to mLAD: Xience DES 2.75 x 15 (3.0 mm)   Hyperlipidemia due to type 2 diabetes mellitus (HCC)   Gastroparesis   Diabetes mellitus type 2, uncontrolled (HCC)  NSTEMI: troponin continues to climb. On heparin. Cardiology following - Anticipate cath on 10/22.  - Continue Heparin. - Continue ASA, Statin, HTN and DM control - Management per cardiology  N/V: likely from gastroparesis. Chronic issue for pt that flares from time to time.  - Continue Reglan scheduled  And increase to TID until sx resolve/improve. Zofran prn  HTN:  - continue home regimen   DVT prophylaxis: heparin Code Status: full Family Communication: none Disposition Plan: pending cardiac cath    Consultants:   Cardiology  Procedures:   Cardiac Cath 10/22  Antimicrobials:   none   Subjective: Feeling poorly in general. No focal complaints. Denies active CP, sob, palpitations. N/V somewhat improved but continues to come in waves.   Objective: Vitals:   04/08/17 0230 04/08/17 0424 04/08/17 0843 04/08/17 0944  BP: 134/61  (!) 142/68 134/89  Pulse: 96 91 93   Resp: 18 20 20    Temp: (!) 100.5 F (38.1 C)  98.4 F (36.9 C)   TempSrc: Axillary  Oral   SpO2: 96% 96% 93%   Weight:  125.6 kg (276 lb 14.4 oz)    Height:        Intake/Output Summary (Last 24 hours) at 04/08/17  0946 Last data filed at 04/08/17 0655  Gross per 24 hour  Intake             1165 ml  Output              600 ml  Net              565 ml   Filed Weights   04/07/17 1344 04/07/17 1842 04/08/17 0424  Weight: (!) 140.6 kg (310 lb) 127.6 kg (281 lb 4.8 oz) 125.6 kg (276 lb 14.4 oz)    Examination:  General exam: Appears calm and comfortable  Respiratory system: Clear to auscultation. Respiratory effort normal. Cardiovascular system: S1 & S2 heard, RRR. No JVD, murmurs, rubs, gallops or clicks. No pedal edema. Gastrointestinal system: Abdomen is nondistended, soft and nontender. No organomegaly or masses felt. Normal bowel sounds heard. Central nervous system: Alert and oriented. No focal neurological deficits. Extremities: Symmetric 5 x 5 power. Skin: No rashes, lesions or ulcers Psychiatry: Judgement and insight appear normal. Mood & affect appropriate.     Data Reviewed: I have personally reviewed following labs and imaging studies  CBC:  Recent Labs Lab 04/07/17 1204 04/08/17 0707  WBC 12.4* 11.6*  NEUTROABS 11.2*  --   HGB 14.9 13.1  HCT 44.2 38.6*  MCV 90.2 89.8  PLT 241 227   Basic Metabolic Panel:  Recent Labs Lab 04/07/17 1204 04/08/17 0707  NA 137 134*  K 4.1 3.1*  CL 99* 105  CO2 17* 17*  GLUCOSE 291* 136*  BUN 19 23*  CREATININE 1.37* 1.38*  CALCIUM 9.6 8.8*   GFR: Estimated Creatinine Clearance: 77.2 mL/min (A) (by C-G formula based on SCr of 1.38 mg/dL (H)). Liver Function Tests:  Recent Labs Lab 04/07/17 1204  AST 21  ALT 20  ALKPHOS 63  BILITOT 1.5*  PROT 8.1  ALBUMIN 3.9    Recent Labs Lab 04/07/17 1204  LIPASE 17   No results for input(s): AMMONIA in the last 168 hours. Coagulation Profile:  Recent Labs Lab 04/07/17 1204  INR 1.02   Cardiac Enzymes:  Recent Labs Lab 04/07/17 1204 04/07/17 1838 04/08/17 0018 04/08/17 0707  TROPONINI 0.32* 0.75* 2.64* 5.40*   BNP (last 3 results) No results for input(s): PROBNP  in the last 8760 hours. HbA1C:  Recent Labs  04/07/17 1838  HGBA1C 9.6*   CBG:  Recent Labs Lab 04/07/17 1206 04/07/17 1700 04/07/17 2045 04/08/17 0005 04/08/17 0417  GLUCAP 280* 267* 206* 176* 150*   Lipid Profile:  Recent Labs  04/08/17 0018  CHOL 140  HDL 32*  LDLCALC 89  TRIG 96  CHOLHDL 4.4   Thyroid Function Tests: No results for input(s): TSH, T4TOTAL, FREET4, T3FREE, THYROIDAB in the last 72 hours. Anemia Panel: No results for input(s): VITAMINB12, FOLATE, FERRITIN, TIBC, IRON, RETICCTPCT in the last 72 hours. Sepsis Labs: No results for input(s): PROCALCITON, LATICACIDVEN in the last 168 hours.  No results found for this or any previous visit (from the past 240 hour(s)).       Radiology Studies: Ct Angio Chest Pe W/cm &/or Wo Cm  Result Date: 04/07/2017 CLINICAL DATA:  66 year old male with acute shortness of breath and chest pain. EXAM: CT ANGIOGRAPHY CHEST WITH CONTRAST TECHNIQUE: Multidetector CT imaging of the chest was performed using the standard protocol during bolus administration of intravenous contrast. Multiplanar CT image reconstructions and MIPs were obtained to evaluate the vascular anatomy. CONTRAST:  100 cc intravenous Isovue 370 COMPARISON:  11/18/2016 and prior chest CTs. 04/07/2017 chest radiograph FINDINGS: Cardiovascular: This is a technically adequate study although motion artifact in the lower lungs decreases sensitivity. No pulmonary emboli are identified. Upper limits normal heart size noted with heavy coronary artery calcifications. Thoracic aortic atherosclerotic calcifications noted without aneurysm. No pericardial effusion. Mediastinum/Nodes: No enlarged mediastinal, hilar, or axillary lymph nodes. Thyroid gland, trachea, and esophagus demonstrate no significant findings. Lungs/Pleura: Mild subsegmental atelectasis/ scarring within the mid-lower right lung and left lung base again noted. No airspace disease, consolidation, nodule,  mass, pleural effusion or pneumothorax noted. Upper Abdomen: No acute abnormality Musculoskeletal: No chest wall abnormality. No acute or significant osseous findings. Review of the MIP images confirms the above findings. IMPRESSION: 1. No evidence of acute abnormality. No evidence of pulmonary emboli. 2. Upper limits normal heart size and coronary artery disease 3. Mild subsegmental atelectasis/scarring within the mid and lower lungs. 4.  Aortic Atherosclerosis (ICD10-I70.0). Electronically Signed   By: Harmon Pier M.D.   On: 04/07/2017 15:23   Dg Chest Port 1 View  Result Date: 04/07/2017 CLINICAL DATA:  Chest pain. EXAM: PORTABLE CHEST 1 VIEW COMPARISON:  February 19, 2017 FINDINGS: The heart size and mediastinal contours are within normal limits. Both lungs are clear. The visualized skeletal structures are unremarkable. IMPRESSION: No active disease. Electronically Signed   By: Gerome Sam III M.D   On: 04/07/2017 13:50        Scheduled  Meds: . amLODipine  10 mg Oral Daily  . aspirin EC  81 mg Oral Daily  . atorvastatin  30 mg Oral q1800  . gabapentin  300 mg Oral TID  . insulin aspart  0-9 Units Subcutaneous Q4H  . insulin detemir  85 Units Subcutaneous BID  . metoCLOPramide  10 mg Oral QHS  . metoprolol tartrate  12.5 mg Oral BID  . nitroGLYCERIN  0.5 inch Topical Q6H  . sodium chloride flush  3 mL Intravenous Q12H   Continuous Infusions: . sodium chloride    . heparin 1,500 Units/hr (04/08/17 0655)     LOS: 1 day    Time spent: >30 min    Ozella Rocks, MD [facility]   If 7PM-7AM, please contact night-coverage [email or web address] Password [facility] 04/08/2017, 9:46 AM

## 2017-04-08 NOTE — Progress Notes (Signed)
Pt c/o chest pain 8/10 1 sublingual nitro given, brought chest pain down to 2/10 pt did not want another nitro said he was "ok" 12 lead EKG done no change from earlier will continue to monitor

## 2017-04-08 NOTE — Progress Notes (Signed)
ANTICOAGULATION CONSULT NOTE  Pharmacy Consult for Heparin Indication: chest pain/ACS  Allergies  Allergen Reactions  . Codeine Other (See Comments)    REACTION: GI Upset  . Gabapentin Hives, Itching and Rash    REACTION: rash/hives (per patient, was a reaction to an inactive ingredient in another mgf brand). The patient stated that he does take this medication now and it doesn't cause a rash any more.  . Nabumetone Itching, Rash and Other (See Comments)    REACTION: sick on stomach and rash    Patient Measurements: Height: 6\' 3"  (190.5 cm) Weight: 276 lb 14.4 oz (125.6 kg) IBW/kg (Calculated) : 84.5 Heparin Dosing Weight: 116 kg   Vital Signs: Temp: 98.4 F (36.9 C) (10/22 0843) Temp Source: Oral (10/22 0843) BP: 134/89 (10/22 0944) Pulse Rate: 93 (10/22 0843)  Labs:  Recent Labs  04/07/17 1204 04/07/17 1838 04/07/17 2022 04/08/17 0018 04/08/17 0707  HGB 14.9  --   --   --  13.1  HCT 44.2  --   --   --  38.6*  PLT 241  --   --   --  227  APTT 28  --   --   --   --   LABPROT 13.3  --   --   --   --   INR 1.02  --   --   --   --   HEPARINUNFRC  --   --  0.58 0.50  --   CREATININE 1.37*  --   --   --  1.38*  TROPONINI 0.32* 0.75*  --  2.64* 5.40*   Estimated Creatinine Clearance: 77.2 mL/min (A) (by C-G formula based on SCr of 1.38 mg/dL (H)).  Assessment: [age] yoM presented to ED on 10/21 with N/V consistent with previous c/o gastroparesis.  Pharmacy is now consulted to dose Heparin for r/o ACS. Troponin continue to rise, and patient scheduled for cath lab later today.  Heparin level therapeutic: 0.50; CBC stable, no bleeding documented   Goal of Therapy:  Heparin level 0.3-0.7 units/ml Monitor platelets by anticoagulation protocol: Yes   Plan:  Continue heparin at 1500 units / hr Daily heparin level and CBC Continue to monitor H&H and platelets F/U anticoagulation plan post cath  Ruben Imony Uriah Trueba, PharmD Clinical Pharmacist 04/08/2017 10:09 AM

## 2017-04-08 NOTE — H&P (View-Only) (Signed)
Progress Note  Patient Name: Samuel [name] Date of Encounter: 04/08/2017  Primary Cardiologist: Allyson Sabal - last seen in 2014 Requesting MD: Dr. Margot Ables  Subjective   Earlier this AM, had recurrent SSCP - relieved with NTG. Low grade Fever last PM - now Afebrile] Nausea improves  HE notes several months of DOE & PND/orthopnea - did not tell Dr. Allyson Sabal.  Inpatient Medications    Scheduled Meds: . amLODipine  10 mg Oral Daily  . aspirin EC  81 mg Oral Daily  . atorvastatin  30 mg Oral q1800  . gabapentin  300 mg Oral TID  . insulin aspart  0-9 Units Subcutaneous Q4H  . insulin detemir  85 Units Subcutaneous BID  . metoCLOPramide  10 mg Oral QHS  . metoprolol tartrate  12.5 mg Oral BID  . nitroGLYCERIN  0.5 inch Topical Q6H  . sodium chloride flush  3 mL Intravenous Q12H   Continuous Infusions: . sodium chloride    . heparin 1,500 Units/hr (04/08/17 0655)   PRN Meds: sodium chloride, morphine injection, nitroGLYCERIN, ondansetron (ZOFRAN) IV, sodium chloride flush   Vital Signs    Vitals:   04/08/17 0030 04/08/17 0230 04/08/17 0424 04/08/17 0843  BP: (!) 148/72 134/61  (!) 142/68  Pulse: 99 96 91 93  Resp: (!) 22 18 20 20   Temp: 98.2 F (36.8 C) (!) 100.5 F (38.1 C)  98.4 F (36.9 C)  TempSrc: Oral Axillary  Oral  SpO2: 98% 96% 96% 93%  Weight:   276 lb 14.4 oz (125.6 kg)   Height:        Intake/Output Summary (Last 24 hours) at 04/08/17 0939 Last data filed at 04/08/17 0655  Gross per 24 hour  Intake             1165 ml  Output              600 ml  Net              565 ml   Filed Weights   04/07/17 1344 04/07/17 1842 04/08/17 0424  Weight: (!) 310 lb (140.6 kg) 281 lb 4.8 oz (127.6 kg) 276 lb 14.4 oz (125.6 kg)    Telemetry    NSR 90s - Personally Reviewed  ECG    SR, 94 bpm ; stable subtle Inferior repolarization abnormality (~ STE - also noted in Sept 2018) - Personally Reviewed  Physical Exam   GEN: Obese, mildly disheveled. No acute  distress.   Neck:(difficult to assess JVP with beard & body habitus), no obvious carotid bruit Cardiac: RRR, no murmurs, rubs, or gallops.  Respiratory: Clear to auscultation bilaterally.=- mildly reduced basal breath sounds ; No W/R/R GI: Soft, nontender, non-distended ; obese MS: No edema; No deformity. 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 Psych: Normal affect   Labs    Chemistry Recent Labs Lab 04/07/17 1204 04/08/17 0707  NA 137 134*  K 4.1 3.1*  CL 99* 105  CO2 17* 17*  GLUCOSE 291* 136*  BUN 19 23*  CREATININE 1.37* 1.38*  CALCIUM 9.6 8.8*  PROT 8.1  --   ALBUMIN 3.9  --   AST 21  --   ALT 20  --   ALKPHOS 63  --   BILITOT 1.5*  --   GFRNONAA 53* 53*  GFRAA >60 >60  ANIONGAP  --  12     Hematology Recent  Labs Lab 04/07/17 1204 04/08/17 0707  WBC 12.4* 11.6*  RBC 4.90 4.30  HGB 14.9 13.1  HCT 44.2 38.6*  MCV 90.2 89.8  MCH 30.4 30.5  MCHC 33.7 33.9  RDW 12.9 13.2  PLT 241 227    Cardiac Enzymes Recent Labs Lab 04/07/17 1204 04/07/17 1838 04/08/17 0018 04/08/17 0707  TROPONINI 0.32* 0.75* 2.64* 5.40*    Recent Labs Lab 04/07/17 1218  TROPIPOC 0.26*     BNPNo results for input(s): BNP, PROBNP in the last 168 hours.   DDimer No results for input(s): DDIMER in the last 168 hours.   Radiology    Ct Angio Chest Pe W/cm &/or Wo Cm  Result Date: 04/07/2017 CLINICAL DATA:  66 year old male with acute shortness of breath and chest pain. EXAM: CT ANGIOGRAPHY CHEST WITH CONTRAST TECHNIQUE: Multidetector CT imaging of the chest was performed using the standard protocol during bolus administration of intravenous contrast. Multiplanar CT image reconstructions and MIPs were obtained to evaluate the vascular anatomy. CONTRAST:  100 cc intravenous Isovue 370 COMPARISON:  11/18/2016 and prior chest CTs. 04/07/2017 chest radiograph FINDINGS: Cardiovascular: This is a  technically adequate study although motion artifact in the lower lungs decreases sensitivity. No pulmonary emboli are identified. Upper limits normal heart size noted with heavy coronary artery calcifications. Thoracic aortic atherosclerotic calcifications noted without aneurysm. No pericardial effusion. Mediastinum/Nodes: No enlarged mediastinal, hilar, or axillary lymph nodes. Thyroid gland, trachea, and esophagus demonstrate no significant findings. Lungs/Pleura: Mild subsegmental atelectasis/ scarring within the mid-lower right lung and left lung base again noted. No airspace disease, consolidation, nodule, mass, pleural effusion or pneumothorax noted. Upper Abdomen: No acute abnormality Musculoskeletal: No chest wall abnormality. No acute or significant osseous findings. Review of the MIP images confirms the above findings. IMPRESSION: 1. No evidence of acute abnormality. No evidence of pulmonary emboli. 2. Upper limits normal heart size and coronary artery disease 3. Mild subsegmental atelectasis/scarring within the mid and lower lungs. 4.  Aortic Atherosclerosis (ICD10-I70.0). Electronically Signed   By: Harmon PierJeffrey  Hu M.D.   On: 04/07/2017 15:23   Dg Chest Port 1 View  Result Date: 04/07/2017 CLINICAL DATA:  Chest pain. EXAM: PORTABLE CHEST 1 VIEW COMPARISON:  February 19, 2017 FINDINGS: The heart size and mediastinal contours are within normal limits. Both lungs are clear. The visualized skeletal structures are unremarkable. IMPRESSION: No active disease. Electronically Signed   By: Gerome Samavid  Williams III M.D   On: 04/07/2017 13:50    Cardiac Studies   No current studies  Last Cath Report: 05/2009 - (Dr. Karilyn CotaBerry)mLAD ~90%, Cx: distal AVGroove 50%;co-dom RCA 40%mid, 75% pRPDA --> PCI of mLAD Xience DES 2.75 x 15 (3.0 mm post-dilation).   EF ~60%  Echo 11/2016: EF 60-65%, Gr 1 DD..  Mild RV dilation.  Patient Profile     66 y.o. male with medical history significant of CAD s/p stenting in 2010, DM,  PE quit taking eloquis due to cost issues, GERD, OSA, HTN comes in with 2 days of epigastric /sscp that does not radiate but associated with a lot of nausea and vomiting.  He has been feeling awful since Saturday 10/19 AM.  He thought he was coming down with the flu.  The pain is "pressure" in his chest and has become more persistent and is worse when he is vomiting.  No fevers.  No cough.  His glucose has been more elevated more than usual also with glucose over 300 and he hasnt been able to eat either.  Pt  found to have elevated troponin level and referred for admission for NSTEMI.   Assessment & Plan    Principal Problem:   NSTEMI (non-ST elevated myocardial infarction) (HCC) Active Problems:   CAD S/P DES PCI to mLAD: Xience DES 2.75 x 15 (3.0 mm)   Diabetes mellitus type 2, uncontrolled (HCC)   Essential hypertension   Hyperlipidemia due to type 2 diabetes mellitus (HCC)   DM (diabetes mellitus), type 2 (HCC)   NAUSEA AND VOMITING   Pulmonary embolism (HCC)   Gastroparesis  Admitting Sx are more c/w possible viral illness, but now with CP & NSTEMI by Troponin, I think that the stress of his illness may have triggered ACS.  Plan:   NSTEMI - continue IV Heparin, ASA & statin.  started low dose Beta Blocker (Metoprolol 12.5 mg BID) & continue amlodipine; Increase statin to 80mg .  Will try to get added on to Cath Board for today - would use Brilinta initially & consider changing to Plavix if unable to afford.  The procedure with Risks/Benefits/Alternatives and Indications was reviewed with the patient.  All questions were answered.    Risks / Complications include, but not limited to: Death, MI, CVA/TIA, VF/VT (with defibrillation), Bradycardia (need for temporary pacer placement), contrast induced nephropathy, bleeding / bruising / hematoma / pseudoaneurysm, vascular or coronary injury (with possible emergent CT or Vascular Surgery), adverse medication reactions, infection.  Additional  risks involving the use of radiation with the possibility of radiation burns and cancer were explained in detail.  The patient voices understanding and agree to proceed.     Had been on ACE-I in the past - reassess post-cath.  DM & Gastroparesis per [facility] Team.  For questions or updates, please contact [facility] Please consult [email or web address] for contact info under Cardiology/STEMI.      Signed, Bryan Lemma, MD  04/08/2017, 9:39 AM

## 2017-04-09 ENCOUNTER — Encounter (HOSPITAL_COMMUNITY): Payer: Self-pay | Admitting: Cardiovascular Disease

## 2017-04-09 ENCOUNTER — Inpatient Hospital Stay (HOSPITAL_COMMUNITY): Payer: PPO

## 2017-04-09 ENCOUNTER — Other Ambulatory Visit: Payer: Self-pay | Admitting: *Deleted

## 2017-04-09 ENCOUNTER — Encounter (HOSPITAL_COMMUNITY): Payer: Self-pay | Admitting: Certified Registered"

## 2017-04-09 ENCOUNTER — Encounter: Payer: Self-pay | Admitting: *Deleted

## 2017-04-09 DIAGNOSIS — E785 Hyperlipidemia, unspecified: Secondary | ICD-10-CM

## 2017-04-09 DIAGNOSIS — I214 Non-ST elevation (NSTEMI) myocardial infarction: Secondary | ICD-10-CM

## 2017-04-09 DIAGNOSIS — Z01810 Encounter for preprocedural cardiovascular examination: Secondary | ICD-10-CM

## 2017-04-09 DIAGNOSIS — I2510 Atherosclerotic heart disease of native coronary artery without angina pectoris: Secondary | ICD-10-CM

## 2017-04-09 DIAGNOSIS — E1169 Type 2 diabetes mellitus with other specified complication: Secondary | ICD-10-CM

## 2017-04-09 LAB — GLUCOSE, CAPILLARY
GLUCOSE-CAPILLARY: 110 mg/dL — AB (ref 65–99)
GLUCOSE-CAPILLARY: 87 mg/dL (ref 65–99)
GLUCOSE-CAPILLARY: 121 mg/dL — AB (ref 65–99)
Glucose-Capillary: 91 mg/dL (ref 65–99)

## 2017-04-09 LAB — PULMONARY FUNCTION TEST
FEF 25-75 Pre: 0.35 L/sec
FEF2575-%Pred-Pre: 10 %
FEV1-%Pred-Pre: 16 %
FEV1-Pre: 0.7 L
FEV1FVC-%Pred-Pre: 44 %
FEV6-%Pred-Pre: 36 %
FEV6-Pre: 1.92 L
FEV6FVC-%Pred-Pre: 96 %
FVC-%Pred-Pre: 37 %
FVC-Pre: 2.09 L
Pre FEV1/FVC ratio: 33 %
Pre FEV6/FVC Ratio: 92 %

## 2017-04-09 LAB — HEPARIN LEVEL (UNFRACTIONATED): HEPARIN UNFRACTIONATED: 0.62 [IU]/mL (ref 0.30–0.70)

## 2017-04-09 LAB — CBC
HCT: 37.1 % — ABNORMAL LOW (ref 39.0–52.0)
HEMOGLOBIN: 12.4 g/dL — AB (ref 13.0–17.0)
MCH: 30.2 pg (ref 26.0–34.0)
MCHC: 33.4 g/dL (ref 30.0–36.0)
MCV: 90.5 fL (ref 78.0–100.0)
PLATELETS: 195 10*3/uL (ref 150–400)
RBC: 4.1 MIL/uL — AB (ref 4.22–5.81)
RDW: 13.4 % (ref 11.5–15.5)
WBC: 8.3 10*3/uL (ref 4.0–10.5)

## 2017-04-09 LAB — BASIC METABOLIC PANEL
Anion gap: 8 (ref 5–15)
BUN: 20 mg/dL (ref 6–20)
CALCIUM: 8.6 mg/dL — AB (ref 8.9–10.3)
CO2: 23 mmol/L (ref 22–32)
CREATININE: 0.98 mg/dL (ref 0.61–1.24)
Chloride: 107 mmol/L (ref 101–111)
GFR calc non Af Amer: >60 mL/min (ref >60)
Glucose, Bld: 121 mg/dL — ABNORMAL HIGH (ref 65–99)
Potassium: 3.4 mmol/L — ABNORMAL LOW (ref 3.5–5.1)
SODIUM: 138 mmol/L (ref 135–145)

## 2017-04-09 MED ORDER — PROMETHAZINE HCL 25 MG/ML IJ SOLN
12.5000 mg | Freq: Three times a day (TID) | INTRAMUSCULAR | Status: DC | PRN
  Administered 2017-04-09: 25 mg via INTRAVENOUS
  Administered 2017-04-09 (×2): 12.5 mg via INTRAVENOUS
  Administered 2017-04-10: 25 mg via INTRAVENOUS
  Filled 2017-04-09 (×5): qty 1

## 2017-04-09 MED ORDER — SODIUM CHLORIDE 0.9 % IV SOLN
30.0000 ug/min | INTRAVENOUS | Status: AC
  Filled 2017-04-09: qty 2

## 2017-04-09 MED ORDER — VANCOMYCIN HCL 10 G IV SOLR
1250.0000 mg | INTRAVENOUS | Status: AC
  Filled 2017-04-09: qty 1250

## 2017-04-09 MED ORDER — MAGNESIUM SULFATE 2 GM/50ML IV SOLN
2.0000 g | Freq: Once | INTRAVENOUS | Status: AC
Start: 2017-04-09 — End: 2017-04-09
  Administered 2017-04-09: 2 g via INTRAVENOUS
  Filled 2017-04-09: qty 50

## 2017-04-09 MED ORDER — CEFUROXIME SODIUM 750 MG IJ SOLR
750.0000 mg | INTRAMUSCULAR | Status: DC
  Filled 2017-04-09: qty 750

## 2017-04-09 MED ORDER — SODIUM CHLORIDE 0.9 % IV SOLN
INTRAVENOUS | Status: AC
  Filled 2017-04-09: qty 30

## 2017-04-09 MED ORDER — PLASMA-LYTE 148 IV SOLN
INTRAVENOUS | Status: AC
  Filled 2017-04-09: qty 2.5

## 2017-04-09 MED ORDER — SODIUM CHLORIDE 0.9 % IV SOLN
INTRAVENOUS | Status: AC
  Filled 2017-04-09: qty 1

## 2017-04-09 MED ORDER — TRANEXAMIC ACID (OHS) PUMP PRIME SOLUTION
2.0000 mg/kg | INTRAVENOUS | Status: AC
  Filled 2017-04-09: qty 2.57

## 2017-04-09 MED ORDER — CEFUROXIME SODIUM 1.5 G IV SOLR
1.5000 g | INTRAVENOUS | Status: DC
  Filled 2017-04-09: qty 1.5

## 2017-04-09 MED ORDER — NITROGLYCERIN IN D5W 200-5 MCG/ML-% IV SOLN
2.0000 ug/min | INTRAVENOUS | Status: AC
  Filled 2017-04-09: qty 250

## 2017-04-09 MED ORDER — POTASSIUM CHLORIDE 2 MEQ/ML IV SOLN
80.0000 meq | INTRAVENOUS | Status: DC
  Filled 2017-04-09: qty 40

## 2017-04-09 MED ORDER — EPINEPHRINE PF 1 MG/ML IJ SOLN
0.0000 ug/min | INTRAVENOUS | Status: DC
  Filled 2017-04-09: qty 4

## 2017-04-09 MED ORDER — METOCLOPRAMIDE HCL 5 MG/ML IJ SOLN
10.0000 mg | Freq: Three times a day (TID) | INTRAMUSCULAR | Status: DC
  Administered 2017-04-09 – 2017-04-11 (×7): 10 mg via INTRAVENOUS
  Filled 2017-04-09 (×7): qty 2

## 2017-04-09 MED ORDER — TRANEXAMIC ACID 1000 MG/10ML IV SOLN
1.5000 mg/kg/h | INTRAVENOUS | Status: AC
  Filled 2017-04-09: qty 25

## 2017-04-09 MED ORDER — ALBUTEROL SULFATE (2.5 MG/3ML) 0.083% IN NEBU
2.5000 mg | INHALATION_SOLUTION | Freq: Once | RESPIRATORY_TRACT | Status: DC
Start: 2017-04-09 — End: 2017-04-11

## 2017-04-09 MED ORDER — TRAMADOL HCL 50 MG PO TABS
100.0000 mg | ORAL_TABLET | Freq: Two times a day (BID) | ORAL | Status: DC | PRN
  Administered 2017-04-09: 100 mg via ORAL
  Filled 2017-04-09: qty 2

## 2017-04-09 MED ORDER — DEXMEDETOMIDINE HCL IN NACL 400 MCG/100ML IV SOLN
0.1000 ug/kg/h | INTRAVENOUS | Status: AC
  Filled 2017-04-09: qty 100

## 2017-04-09 MED ORDER — DOPAMINE-DEXTROSE 3.2-5 MG/ML-% IV SOLN
0.0000 ug/kg/min | INTRAVENOUS | Status: DC
  Filled 2017-04-09: qty 250

## 2017-04-09 MED ORDER — POTASSIUM CHLORIDE 10 MEQ/100ML IV SOLN
10.0000 meq | INTRAVENOUS | Status: AC
  Administered 2017-04-09 (×6): 10 meq via INTRAVENOUS
  Filled 2017-04-09: qty 100

## 2017-04-09 MED ORDER — MAGNESIUM SULFATE 50 % IJ SOLN
40.0000 meq | INTRAMUSCULAR | Status: AC
  Filled 2017-04-09 (×2): qty 9.85

## 2017-04-09 MED ORDER — TRANEXAMIC ACID (OHS) BOLUS VIA INFUSION
15.0000 mg/kg | INTRAVENOUS | Status: AC
  Filled 2017-04-09: qty 1928

## 2017-04-09 NOTE — Progress Notes (Signed)
Progress Note  Patient Name: Samuel [name] Date of Encounter: 04/09/2017  Primary Cardiologist: Allyson Sabal - last seen in 2014 Requesting MD: Dr. Margot Ables  Subjective   No further chest pain. Breathing is also improved. Still has nausea  Inpatient Medications    Scheduled Meds: . albuterol  2.5 mg Nebulization Once  . amLODipine  10 mg Oral Daily  . aspirin EC  81 mg Oral Daily  . atorvastatin  80 mg Oral q1800  . gabapentin  300 mg Oral TID  . insulin detemir  85 Units Subcutaneous BID  . metoCLOPramide (REGLAN) injection  10 mg Intravenous TID AC  . metoprolol tartrate  12.5 mg Oral BID  . nitroGLYCERIN  0.5 inch Topical Q6H  . sodium chloride flush  3 mL Intravenous Q12H  . sodium chloride flush  3 mL Intravenous Q12H   Continuous Infusions: . sodium chloride    . sodium chloride    . heparin 1,500 Units/hr (04/09/17 1346)  . potassium chloride 10 mEq (04/09/17 1419)   PRN Meds: sodium chloride, sodium chloride, acetaminophen, morphine injection, nitroGLYCERIN, ondansetron (ZOFRAN) IV, promethazine, sodium chloride flush, sodium chloride flush, traMADol   Vital Signs    Vitals:   04/09/17 0427 04/09/17 0800 04/09/17 0809 04/09/17 1149  BP:  (!) 136/99 (!) 139/99 138/82  Pulse:  82 75 80  Resp:  (!) 21 16 19   Temp: 98.3 F (36.8 C)  98.3 F (36.8 C) 98.2 F (36.8 C)  TempSrc: Oral  Oral Oral  SpO2:  92% 94% 95%  Weight: 283 lb 4.7 oz (128.5 kg)     Height:        Intake/Output Summary (Last 24 hours) at 04/09/17 1425 Last data filed at 04/09/17 0800  Gross per 24 hour  Intake              355 ml  Output              650 ml  Net             -295 ml   Filed Weights   04/07/17 1842 04/08/17 0424 04/09/17 0427  Weight: 281 lb 4.8 oz (127.6 kg) 276 lb 14.4 oz (125.6 kg) 283 lb 4.7 oz (128.5 kg)    Telemetry    NSR 70s-80s- Personally Reviewed  ECG    No new EKG findings - Personally Reviewed  Physical Exam   GEN:  obese. NAD. Heavy beard.  Neck:  Unable to assess JVD or carotid bruit due to heavy.. Cardiac:  RRR with a roughly 1/6 HSM. No other are/G. Normal S1 and S2. Respiratory:  CTA B, minimally labored. Diffuse interstitial sounds, no obvious rales GI:  abdomen is soft/NT/ND/NABS. Notable truncal obesity. MS:  no clubbing cyanosis or edema. Right foot great toe indicated. Left BKA. Right leg has venous stasis changes. Neuro:   overall nonfocal. Psych: Pleasant/normal mood and affect  Labs    Chemistry  Recent Labs Lab 04/07/17 1204 04/08/17 0707 04/09/17 0349  NA 137 134* 138  K 4.1 3.1* 3.4*  CL 99* 105 107  CO2 17* 17* 23  GLUCOSE 291* 136* 121*  BUN 19 23* 20  CREATININE 1.37* 1.38* 0.98  CALCIUM 9.6 8.8* 8.6*  PROT 8.1  --   --   ALBUMIN 3.9  --   --   AST 21  --   --   ALT 20  --   --   ALKPHOS 63  --   --  BILITOT 1.5*  --   --   GFRNONAA 53* 53* >60  GFRAA >60 >60 >60  ANIONGAP  --  12 8     Hematology  Recent Labs Lab 04/07/17 1204 04/08/17 0707 04/09/17 0349  WBC 12.4* 11.6* 8.3  RBC 4.90 4.30 4.10*  HGB 14.9 13.1 12.4*  HCT 44.2 38.6* 37.1*  MCV 90.2 89.8 90.5  MCH 30.4 30.5 30.2  MCHC 33.7 33.9 33.4  RDW 12.9 13.2 13.4  PLT 241 227 195    Cardiac Enzymes  Recent Labs Lab 04/07/17 1204 04/07/17 1838 04/08/17 0018 04/08/17 0707  TROPONINI 0.32* 0.75* 2.64* 5.40*     Recent Labs Lab 04/07/17 1218  TROPIPOC 0.26*     BNPNo results for input(s): BNP, PROBNP in the last 168 hours.   DDimer No results for input(s): DDIMER in the last 168 hours.   Radiology    Ct Angio Chest Pe W/cm &/or Wo Cm  Result Date: 04/07/2017 CLINICAL DATA:  66 year old male with acute shortness of breath and chest pain. EXAM: CT ANGIOGRAPHY CHEST WITH CONTRAST TECHNIQUE: Multidetector CT imaging of the chest was performed using the standard protocol during bolus administration of intravenous contrast. Multiplanar CT image reconstructions and MIPs were obtained to evaluate the vascular anatomy.  CONTRAST:  100 cc intravenous Isovue 370 COMPARISON:  11/18/2016 and prior chest CTs. 04/07/2017 chest radiograph FINDINGS: Cardiovascular: This is a technically adequate study although motion artifact in the lower lungs decreases sensitivity. No pulmonary emboli are identified. Upper limits normal heart size noted with heavy coronary artery calcifications. Thoracic aortic atherosclerotic calcifications noted without aneurysm. No pericardial effusion. Mediastinum/Nodes: No enlarged mediastinal, hilar, or axillary lymph nodes. Thyroid gland, trachea, and esophagus demonstrate no significant findings. Lungs/Pleura: Mild subsegmental atelectasis/ scarring within the mid-lower right lung and left lung base again noted. No airspace disease, consolidation, nodule, mass, pleural effusion or pneumothorax noted. Upper Abdomen: No acute abnormality Musculoskeletal: No chest wall abnormality. No acute or significant osseous findings. Review of the MIP images confirms the above findings. IMPRESSION: 1. No evidence of acute abnormality. No evidence of pulmonary emboli. 2. Upper limits normal heart size and coronary artery disease 3. Mild subsegmental atelectasis/scarring within the mid and lower lungs. 4.  Aortic Atherosclerosis (ICD10-I70.0). Electronically Signed   By: Harmon Pier M.D.   On: 04/07/2017 15:23    Cardiac Studies   No current studies  Last Cath Report: 05/2009 - (Dr. Karilyn Cota ~90%, Cx: distal AVGroove 50%;co-dom RCA 40%mid, 75% pRPDA --> PCI of mLAD Xience DES 2.75 x 15 (3.0 mm post-dilation).   EF ~60%  Echo 11/2016: EF 60-65%, Gr 1 DD..  Mild RV dilation.  LEFT HEART CATH AND CORONARY ANGIOGRAPHY: 04/08/2017     Ost RCA to Prox RCA lesion, 100 %stenosed.  Ost Cx to Prox Cx lesion, 80 %stenosed.  Mid LAD lesion, 0 %stenosed.  There is mild left ventricular systolic dysfunction.  LV end diastolic pressure is mildly elevated.  The left ventricular ejection fraction is 50-55% by visual  estimate  -- Ostial circumflex be a difficult PCI target. The entire RCA is extensively occluded until collateralizations fill the PDA.  -- Not thought to be a good PCI candidate. CT surgical consult also.      Patient Profile     66 y.o. male with medical history significant of CAD s/p stenting in 2010, DM, PE quit taking eloquis due to cost issues, GERD, OSA, HTN comes in with 2 days of epigastric /sscp that does not radiate but  associated with a lot of nausea and vomiting.  He has been feeling awful since Saturday 10/19 AM.  He thought he was coming down with the flu.  The pain is "pressure" in his chest and has become more persistent and is worse when he is vomiting.  No fevers.  No cough.  His glucose has been more elevated more than usual also with glucose over 300 and he hasnt been able to eat either.  Pt found to have elevated troponin level and referred for admission for NSTEMI.   Assessment & Plan    Principal Problem:   NSTEMI (non-ST elevated myocardial infarction) (HCC) Active Problems:   CAD S/P DES PCI to mLAD: Xience DES 2.75 x 15 (3.0 mm)   Diabetes mellitus type 2, uncontrolled (HCC)   Essential hypertension   Hyperlipidemia due to type 2 diabetes mellitus (HCC)   DM (diabetes mellitus), type 2 (HCC)   NAUSEA AND VOMITING   Pulmonary embolism (HCC)   Gastroparesis  Admitting Sx are more c/w possible viral illness, but now with CP & NSTEMI by Troponin, I think that the stress of his illness may have triggered ACS.  Plan:   NSTEMI - continue IV Heparin, ASA & statin.  started low dose Beta Blocker (Metoprolol 12.5 mg BID) & continue amlodipine; Increase statin to 80mg .  Cardiac cath yesterday with significant disease. CT surgical consult called today. -> He had been seen by the PA when I went to go see him, at that time he was indicating that he may not be interested in the effort that it takes to do CABG.  As an aside, later on this evening seen by Dr. Tyrone SageGerhardt --  the general consensus is that he did not believe the patient would make a meaningful recovery with CABG based on his significant diabetic issues with gastroparesis etc. and diabetic peripheral vascular disease. Also severe COPD.     With this new information, we will now need to discuss with interventional colleagues about potential options for PCI. The question is how can we successfully treat the ostial circumflex without affecting the LAD and left main. I do not feel that the RCA is treatable. --> Will review with colleagues tomorrow morning and then discussed suggestions with the patient.  For now, continue IV heparin until we have made a decision. - As he will not be going for CABG, we can consider loading with him and starting Plavix We can gradually titrate her beta blocker and consider adding Imdur - we will convert from IV nitroglycerin to Imdur tomorrow   Had been on ACE-I in the past - reassess once we decided what to do artery possible  Increasing dose of statin for hyperlipidemia  DM [facility]ropares[facility].  For questions or updates, please contact [facility] Please consult [email or web address] for contact info under Cardiology/STEMI.      Signed, Bryan Lemmaavid Kayloni Rocco, MD  04/09/2017, 2:25 PM

## 2017-04-09 NOTE — Progress Notes (Addendum)
ANTICOAGULATION CONSULT NOTE - Follow-up Consult  Pharmacy Consult for Heparin Indication: 2vCAD  Patient Measurements: Height: 6\' 3"  (190.5 cm) Weight: 283 lb 4.7 oz (128.5 kg) IBW/kg (Calculated) : 84.5 Heparin Dosing Weight: 112 kg  Vital Signs: Temp: 98.3 F (36.8 C) (10/23 0809) Temp Source: Oral (10/23 0809) BP: 139/99 (10/23 0809) Pulse Rate: 75 (10/23 0809)  Labs:  Recent Labs  04/07/17 1204 04/07/17 1838 04/07/17 2022 04/08/17 0018 04/08/17 0707 04/09/17 0349  HGB 14.9  --   --   --  13.1 12.4*  HCT 44.2  --   --   --  38.6* 37.1*  PLT 241  --   --   --  227 195  APTT 28  --   --   --   --   --   LABPROT 13.3  --   --   --   --   --   INR 1.02  --   --   --   --   --   HEPARINUNFRC  --   --  0.58 0.50  --  0.62  CREATININE 1.37*  --   --   --  1.38* 0.98  TROPONINI 0.32* 0.75*  --  2.64* 5.40*  --    Estimated Creatinine Clearance: 110 mL/min (by C-G formula based on SCr of 0.98 mg/dL).  Assessment: [age] YOM who presented on 10/21 with ACS sx and underwent a cardiac cath 10/22 that revealed 2vCAD. Plans are to get a surgical evaluation. Pharmacy consulted to resume heparin.  Heparin level therapeutic: 0.62; CBC stable, No bleeding documented  Goal of Therapy:  Heparin level 0.3-0.7 units/ml Monitor platelets by anticoagulation protocol: Yes   Plan:  1. Continue hepatin gtt at 1500 units/hr 2. Daily heparin level/CBC 3. Monitor for signs/symptoms of bleeding  4. F/U surgical recommendations   Thank you for allowing pharmacy to be a part of this patient's care.  Ruben Imony Destyne Goodreau, PharmD Clinical Pharmacist 04/09/2017 10:18 AM

## 2017-04-09 NOTE — Progress Notes (Signed)
PROGRESS NOTE    Samuel BusingJohn D Alwin  ZOX:[id number] DOB: September 20, 1950 DOA: 04/07/2017 PCP: Pearson GrippeKim, James, MD    Brief Narrative:  Samuel [name] is a 66 y.o. male with medical history significant of CAD s/p stenting in 2010, DM, PE quit taking eloquis due to cost issues, GERD, OSA, HTN comes in with 2 days of epigastric /sscp that does not radiate but associated with a lot of nausea and vomiting.  He has been feeling awful since yesterday morning.  He thought he was coming down with the flu.  The pain is "pressure" in his chest and has become more persistent and is worse when he is vomiting.  No fevers.  No cough.  His glucose has been more elevated more than usual also with glucose over 300 and he hasnt been able to eat either.  Pt found to have elevated troponin level and referred for admission for NSTEMI.  Cardiology has been called and request transfer to [facility].   Assessment & Plan:   Principal Problem:   NSTEMI (non-ST elevated myocardial infarction) (HCC) Active Problems:   NAUSEA AND VOMITING   Pulmonary embolism (HCC)   CAD S/P DES PCI to mLAD: Xience DES 2.75 x 15 (3.0 mm)   Essential hypertension   Hyperlipidemia due to type 2 diabetes mellitus (HCC)   Gastroparesis   DM (diabetes mellitus), type 2 (HCC)   Diabetes mellitus type 2, uncontrolled (HCC)   NSTEMI: Right heart catheterization performed on 10/22 showing two-vessel disease with 100% stenosis of the RCA and 80% stenosis of the Proximal Circumflex artery. Pt now being evaluated for possible CABG - Mgt per Cardiology and now cardiothoracic surgery - Anticipate cath on 10/22.  - Continue Heparin. - Continue ASA, Statin, HTN and DM control  N/V: likely from gastroparesis and general ill feeling from occlusive CAD. Chronic issue for pt that flares from time to time.  - Continue Reglan scheduled  And increase to TID until sx resolve/improve.  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   DVT prophylaxis: Hepain Code Status: FULL Family Communication: none Disposition Plan: pending CABG eval and intervention   Consultants:   Cardiology, Cardiothoracic surgery  Procedures:   10/22 - LHC  Antimicrobials:   None    Subjective: Feeling poorly today. N/V has returned. Unable to tolerate most po. Denies CP, palpitations, sob, fever.   Objective: Vitals:   04/09/17 0427 04/09/17 0800 04/09/17 0809 04/09/17 1149  BP:  (!) 136/99 (!) 139/99 138/82  Pulse:  82 75 80  Resp:  (!) 21 16 19   Temp: 98.3 F (36.8 C)  98.3 F (36.8 C) 98.2 F (36.8 C)  TempSrc: Oral  Oral Oral  SpO2:  92% 94% 95%  Weight: 128.5 kg (283 lb 4.7 oz)     Height:        Intake/Output Summary (Last 24 hours) at 04/09/17 1315 Last data filed at 04/09/17 0506  Gross per 24 hour  Intake              355 ml  Output              650 ml  Net             -295 ml   Filed Weights   04/07/17 1842 04/08/17 0424 04/09/17 0427  Weight:  127.6 kg (281 lb 4.8 oz) 125.6 kg (276 lb 14.4 oz) 128.5 kg (283 lb 4.7 oz)    Examination:  General exam: Ill appearingm, obese, resting in bed comfortably Respiratory system: Clear to auscultation. Respiratory effort normal. Cardiovascular system: RRR. II/VI systolic murmur Gastrointestinal system: Abdomen is nondistended, soft and nontender.  Central nervous system: Alert and oriented. No focal neurological deficits. Extremities: Symmetric 5 x 5 power. Skin: No rashes, lesions or ulcers Psychiatry: Judgement and insight appear normal. Mood & affect appropriate.     Data Reviewed: I have personally reviewed following labs and imaging studies  CBC:  Recent Labs Lab 04/07/17 1204 04/08/17 0707 04/09/17 0349  WBC 12.4* 11.6* 8.3  NEUTROABS 11.2*  --   --   HGB 14.9 13.1 12.4*  HCT 44.2 38.6* 37.1*  MCV 90.2 89.8 90.5  PLT 241 227 195     Basic Metabolic Panel:  Recent Labs Lab 04/07/17 1204 04/08/17 0707 04/09/17 0349  NA 137 134* 138  K 4.1 3.1* 3.4*  CL 99* 105 107  CO2 17* 17* 23  GLUCOSE 291* 136* 121*  BUN 19 23* 20  CREATININE 1.37* 1.38* 0.98  CALCIUM 9.6 8.8* 8.6*   GFR: Estimated Creatinine Clearance: 110 mL/min (by C-G formula based on SCr of 0.98 mg/dL). Liver Function Tests:  Recent Labs Lab 04/07/17 1204  AST 21  ALT 20  ALKPHOS 63  BILITOT 1.5*  PROT 8.1  ALBUMIN 3.9    Recent Labs Lab 04/07/17 1204  LIPASE 17   No results for input(s): AMMONIA in the last 168 hours. Coagulation Profile:  Recent Labs Lab 04/07/17 1204  INR 1.02   Cardiac Enzymes:  Recent Labs Lab 04/07/17 1204 04/07/17 1838 04/08/17 0018 04/08/17 0707  TROPONINI 0.32* 0.75* 2.64* 5.40*   BNP (last 3 results) No results for input(s): PROBNP in the last 8760 hours. HbA1C:  Recent Labs  04/07/17 1838  HGBA1C 9.6*   CBG:  Recent Labs Lab 04/08/17 2013 04/08/17 2350 04/09/17 0426 04/09/17 0806 04/09/17 1151  GLUCAP 150* 152* 110* 91 87   Lipid Profile:  Recent Labs  04/08/17 0018  CHOL 140  HDL 32*  LDLCALC 89  TRIG 96  CHOLHDL 4.4   Thyroid Function Tests: No results for input(s): TSH, T4TOTAL, FREET4, T3FREE, THYROIDAB in the last 72 hours. Anemia Panel: No results for input(s): VITAMINB12, FOLATE, FERRITIN, TIBC, IRON, RETICCTPCT in the last 72 hours. Sepsis Labs: No results for input(s): PROCALCITON, LATICACIDVEN in the last 168 hours.  No results found for this or any previous visit (from the past 240 hour(s)).       Radiology Studies: Ct Angio Chest Pe W/cm &/or Wo Cm  Result Date: 04/07/2017 CLINICAL DATA:  66 year old male with acute shortness of breath and chest pain. EXAM: CT ANGIOGRAPHY CHEST WITH CONTRAST TECHNIQUE: Multidetector CT imaging of the chest was performed using the standard protocol during bolus administration of intravenous contrast. Multiplanar  CT image reconstructions and MIPs were obtained to evaluate the vascular anatomy. CONTRAST:  100 cc intravenous Isovue 370 COMPARISON:  11/18/2016 and prior chest CTs. 04/07/2017 chest radiograph FINDINGS: Cardiovascular: This is a technically adequate study although motion artifact in the lower lungs decreases sensitivity. No pulmonary emboli are identified. Upper limits normal heart size noted with heavy coronary artery calcifications. Thoracic aortic atherosclerotic calcifications noted without aneurysm. No pericardial effusion. Mediastinum/Nodes: No enlarged mediastinal, hilar, or axillary lymph nodes. Thyroid gland, trachea, and esophagus demonstrate no significant findings. Lungs/Pleura: Mild subsegmental atelectasis/ scarring within  the mid-lower right lung and left lung base again noted. No airspace disease, consolidation, nodule, mass, pleural effusion or pneumothorax noted. Upper Abdomen: No acute abnormality Musculoskeletal: No chest wall abnormality. No acute or significant osseous findings. Review of the MIP images confirms the above findings. IMPRESSION: 1. No evidence of acute abnormality. No evidence of pulmonary emboli. 2. Upper limits normal heart size and coronary artery disease 3. Mild subsegmental atelectasis/scarring within the mid and lower lungs. 4.  Aortic Atherosclerosis (ICD10-I70.0). Electronically Signed   By: Harmon Pier M.D.   On: 04/07/2017 15:23   Dg Chest Port 1 View  Result Date: 04/07/2017 CLINICAL DATA:  Chest pain. EXAM: PORTABLE CHEST 1 VIEW COMPARISON:  February 19, 2017 FINDINGS: The heart size and mediastinal contours are within normal limits. Both lungs are clear. The visualized skeletal structures are unremarkable. IMPRESSION: No active disease. Electronically Signed   By: Gerome Sam III M.D   On: 04/07/2017 13:50        Scheduled Meds: . amLODipine  10 mg Oral Daily  . aspirin EC  81 mg Oral Daily  . atorvastatin  80 mg Oral q1800  . gabapentin  300  mg Oral TID  . insulin detemir  85 Units Subcutaneous BID  . metoCLOPramide (REGLAN) injection  10 mg Intravenous TID AC  . metoprolol tartrate  12.5 mg Oral BID  . nitroGLYCERIN  0.5 inch Topical Q6H  . sodium chloride flush  3 mL Intravenous Q12H  . sodium chloride flush  3 mL Intravenous Q12H   Continuous Infusions: . sodium chloride    . sodium chloride    . heparin 1,500 Units/hr (04/08/17 2026)  . potassium chloride 10 mEq (04/09/17 1204)     LOS: 2 days    Time spent: >35    Ozella Rocks, MD [facility]   If 7PM-7AM, please contact night-coverage [email or web address] Password [email or web address] 04/09/2017, 1:15 PM

## 2017-04-09 NOTE — Patient Outreach (Addendum)
[facility] Care Management  04/09/2017  Samuel BusingJohn D [name] February 11, 1951 [id number]   CSW made an initial attempt to try and contact patient today to perform phone assessment, as well as assess and assist with social needs and services, without success.  A HIPAA compliant message was left for patient on voicemail.  CSW is currently awaiting a return call. CSW will make a second outreach attempt within the next week, if CSW does not receive a return call from patient in the meantime. Danford BadJoanna Birdella Sippel, BSW, MSW, LCSW  Licensed Restaurant manager, fast foodClinical Social Worker  [facility] Care Management [facility]  [place] [place], [place], [place] [address] Physical Address-[address], [place], [place] [address] Toll Free Main # [phone] Fax # [phone] Cell # [phone]  Office # [phone] [email or web address]   Addendum: CSW noted that patient was unavailable at the time of CSW's call due to patient being hospitalized.  CSW will make an initial outreach attempt to patient once patient has been discharged back into the community. Danford BadJoanna Meril Dray, BSW, MSW, LCSW  Licensed Restaurant manager, fast foodClinical Social Worker  [facility] Care Management [facility]  [address] [place], [place], [place] [address] Physical Address-[address], [place], [place] [address] Toll Free Main # [phone] Fax # [phone] Cell # [phone]  Office # [phone] [email or web address]

## 2017-04-09 NOTE — Progress Notes (Addendum)
Pre-CABG testing complete.  See preliminary results in CV Proc.  Right great saphenous vein mapping complete.  See preliminary results in CV Proc.  04/09/17 4:00 PM Olen CordialGreg Norfleet Capers RVT

## 2017-04-09 NOTE — Consult Note (Signed)
[address]       [place] [address]             [phone]        [name] [facility] Medical Record #[id number] Date of Birth: 1950/09/01  Referring: Dr Allyson Sabal  Primary Care: [name], MD  Chief Complaint:    Chief Complaint  Patient presents with  . Emesis    History of Present Illness:    The patient is a 66 year old male with a history of significant CAD status post stenting in 2010.  He also has multiple cardiac risk factors including diabetes mellitus, hyperlipidemia, hypertension, obstructive sleep apnea and peripheral vascular arterial occlusive disease.  He also has a history of pulmonary embolism in 2012 and again this June.  He reportedly stopped taking his Eliquis due to cost issues.  He presented on 04/07/2017 with 2-day history of epigastric/substernal chest pain associated with nausea and vomiting.  He notes long term gastroparesis with almost daily vomiting but worse . He thought that he was coming down with the flu.  The symptoms persisted and worsened over time.  He did note that his blood sugars have been poorly controlled around this event.  They were elevated to over 300. He went to [facility] ER  Stayed 31/2 hours but was not seen so left . He returned  To the  emergency department at [facility] the following day and was found   to have elevated troponin and admitted for non-STEMI.  He was then transferred to [facility] for further management including cardiology consultation and management.  He underwent cardiac catheterization in 2014 and was found at that time to have noncritical disease with normal left ventricular function.  He was placed on intravenous heparin and given aspirin, beta-blocker and statin.  He underwent cardiac catheterization and the results are noted below.  Cardia surgery asked  consulted for consideration of coronary artery bypass grafting.  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    Current Activity/ Functional Status: Wheelchair dependent with history of falls transitioning to bathroom, etc. Lives alone but has cousin 2 doors down who helps him.    Zubrod Score: At the time of surgery this patient's most appropriate activity status/level should be described as: []     0    Normal activity, no symptoms []     1    Restricted in physical strenuous activity but ambulatory, able to do out light work []     2    Ambulatory and capable of self care, unable to do work activities, up and about                 more than 50%  Of the time                            [x]     3    Only limited self care, in bed greater than 50% of waking hours []     4    Completely disabled, no self care, confined to bed or chair []     5    Moribund  Past Medical History:  Diagnosis Date  . Anxiety   . Arthritis    "all over"   . CAD (coronary artery disease)   . Charcot's joint    "  left foot"  . 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 Depression   . Gastroparesis   . GERD (gastroesophageal reflux disease)   . H/O hiatal hernia   . Hyperlipidemia   . Hypertension   . OSA (obstructive sleep apnea)    "not bad enough for a mask"  . Peripheral neuropathy   . Peripheral vascular disease (HCC)   . PONV (postoperative nausea and vomiting)   . Pulmonary embolism (HCC)    hx. of 2012  . Shortness of breath    exertion  . Type II diabetes mellitus (HCC)     Past Surgical History:  Procedure Laterality Date  . AMPUTATION Left 03/08/2015   Procedure:  LEFT AMPUTATION BELOW KNEE;  Surgeon: Toni Arthurs, MD;  Location: [facility] ORS;  Service: Orthopedics;  Laterality: Left;  . APPLICATION OF WOUND VAC Left 01/07/2014  . CHOLECYSTECTOMY  06/2010  . CORONARY ANGIOPLASTY WITH STENT PLACEMENT  05/2009   "1"  . FOOT SURGERY Left 2010   "for Charcot's joint"  . HARDWARE REMOVAL Left 01/07/2014   Procedure: LEFT LEG  REMOVAL OF DEEP IMPLANT AND SEQUESTRECTOMY; APPLICATION OF WOUND VAC ;  Surgeon: Toni Arthurs, MD;  Location: [facility] OR;  Service: Orthopedics;  Laterality: Left;  . I&D EXTREMITY Left "multiple"   leg  . 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 IM NAILING TIBIA Left ~ 2012  . LEFT HEART CATH AND CORONARY ANGIOGRAPHY N/A 04/08/2017   Procedure: LEFT HEART CATH AND CORONARY ANGIOGRAPHY;  Surgeon: Runell Gess, MD;  Location: [facility] INVASIVE CV LAB;  Service: Cardiovascular;  Laterality: N/A;  . SHOULDER ARTHROSCOPY W/ ROTATOR CUFF REPAIR Left    "and bone spurs"  . TIBIAL IM ROD REMOVAL Left 01/07/2014  . TOE AMPUTATION Right ~ 2011   "great toe"  . VENA CAVA FILTER PLACEMENT  2012  . VENA CAVA FILTER PLACEMENT N/A 11/20/2016   Procedure: INSERTION VENA-CAVA FILTER;  Surgeon: Sherren Kerns, MD;  Location: [facility] OR;  Service: Vascular;  Laterality: N/A;  . WOUND DEBRIDEMENT Left 01/07/2014   "tibia"    History  Smoking Status  . Never Smoker  Smokeless Tobacco  . Never Used    History  Alcohol Use  . Yes    Comment: 01/07/2014 'might have a wine cooler a couple times/yr"    Social History   Social History  . Marital status: Single    Spouse name: N/A  . Number of children: N/A  . Years of education: N/A   Occupational History  . DISABLITY Unemployed   Social History Main Topics  . Smoking status: Never Smoker  . Smokeless tobacco: Never Used  . Alcohol use Yes     Comment: 01/07/2014 'might have a wine cooler a couple times/yr"  . Drug use: No  . Sexual activity: No   Other Topics Concern  . Not on file   Social History Narrative  . No narrative on file    Allergies  Allergen Reactions  . Codeine Other (See Comments)    REACTION: GI Upset  . Gabapentin Hives, Itching and Rash    REACTION: rash/hives (per patient, was a  reaction to an inactive ingredient in another mgf brand). The patient stated that he does take this medication now and it doesn't cause a rash any more.  . Nabumetone Itching, Rash  and Other (See Comments)    REACTION: sick on stomach and rash    Current Facility-Administered Medications  Medication Dose Route Frequency Provider Last Rate Last Dose  . 0.9 %  sodium chloride infusion  250 mL Intravenous PRN Tarry Kos A, MD      . 0.9 %  sodium chloride infusion  250 mL Intravenous PRN Runell Gess, MD      . acetaminophen (TYLENOL) tablet 650 mg  650 mg Oral Q4H PRN Runell Gess, MD      . amLODipine (NORVASC) tablet 10 mg  10 mg Oral Daily Tarry Kos A, MD   10 mg at 04/09/17 1150  . aspirin EC tablet 81 mg  81 mg Oral Daily Tarry Kos A, MD   81 mg at 04/09/17 1151  . atorvastatin (LIPITOR) tablet 80 mg  80 mg Oral q1800 Marykay Lex, MD   80 mg at 04/08/17 1716  . gabapentin (NEURONTIN) capsule 300 mg  300 mg Oral TID Tarry Kos A, MD   300 mg at 04/09/17 1150  . heparin ADULT infusion 100 units/mL (25000 units/223mL sodium chloride 0.45%)  1,500 Units/hr Intravenous Continuous Ann Held, [facility] 15 mL/hr at 04/08/17 2026 1,500 Units/hr at 04/08/17 2026  . insulin detemir (LEVEMIR) injection 85 Units  85 Units Subcutaneous BID Haydee Monica, MD   85 Units at 04/08/17 2201  . magnesium sulfate IVPB 2 g 50 mL  2 g Intravenous Once Ozella Rocks, MD 50 mL/hr at 04/09/17 1201 2 g at 04/09/17 1201  . metoCLOPramide (REGLAN) injection 10 mg  10 mg Intravenous TID AC Ozella Rocks, MD   10 mg at 04/09/17 1153  . metoprolol tartrate (LOPRESSOR) tablet 12.5 mg  12.5 mg Oral BID Marykay Lex, MD   12.5 mg at 04/09/17 1149  . morphine 4 MG/ML injection 4 mg  4 mg Intravenous Q2H PRN Haydee Monica, MD   4 mg at 04/09/17 0510  . nitroGLYCERIN (NITROGLYN) 2 % ointment 0.5 inch  0.5 inch Topical Q6H Tarry Kos A, MD   0.5 inch at 04/08/17 1717  .  nitroGLYCERIN (NITROSTAT) SL tablet 0.4 mg  0.4 mg Sublingual Q5 min PRN Tilden Fossa, MD   0.4 mg at 04/08/17 1610  . ondansetron (ZOFRAN) injection 4 mg  4 mg Intravenous Q6H PRN Runell Gess, MD   4 mg at 04/09/17 0808  . potassium chloride 10 mEq in 100 mL IVPB  10 mEq Intravenous Q1 Hr x 6 Ozella Rocks, MD 100 mL/hr at 04/09/17 1204 10 mEq at 04/09/17 1204  . promethazine (PHENERGAN) injection 12.5-25 mg  12.5-25 mg Intravenous Q8H PRN Ozella Rocks, MD   12.5 mg at 04/09/17 0953  . sodium chloride flush (NS) 0.9 % injection 3 mL  3 mL Intravenous Q12H Tarry Kos A, MD   3 mL at 04/09/17 0959  . sodium chloride flush (NS) 0.9 % injection 3 mL  3 mL Intravenous PRN Tarry Kos A, MD      . sodium chloride flush (NS) 0.9 % injection 3 mL  3 mL Intravenous Q12H Runell Gess, MD      . sodium chloride flush (NS) 0.9 % injection 3 mL  3 mL Intravenous PRN Runell Gess, MD      . traMADol Janean Sark) tablet 100 mg  100 mg Oral Q12H PRN Eduard Clos, MD        Prescriptions Prior to Admission  Medication  Sig Dispense Refill Last Dose  . amLODipine (NORVASC) 10 MG tablet Take 1 tablet (10 mg total) by mouth daily. 30 tablet 2 04/05/2017  . Canagliflozin (INVOKANA) 100 MG TABS Take 100 mg by mouth See admin instructions. invokana 100 mg daily. Alternating every other week between Jardiance and Invokana   04/05/2017  . diphenhydramine-acetaminophen (TYLENOL PM) 25-500 MG TABS tablet Take 1 tablet by mouth at bedtime.   04/05/2017  . docusate sodium (COLACE) 100 MG capsule Take 100 mg by mouth daily as needed (for constipation).    04/05/2017  . empagliflozin (JARDIANCE) 10 MG TABS tablet Take 10 mg by mouth See admin instructions. Jardiance 10 mg daily.  Alternating every other week between Jardiance and Invokana   Past Week at Unknown time  . ferrous sulfate 325 (65 FE) MG tablet Take 325 mg by mouth every evening.    04/05/2017  . gabapentin (NEURONTIN) 300 MG  capsule Take 300 mg by mouth 3 (three) times daily.   04/05/2017  . insulin detemir (LEVEMIR) 100 UNIT/ML injection Inject 85 Units into the skin 2 (two) times daily.    04/05/2017  . insulin lispro (HUMALOG) 100 UNIT/ML injection Inject 0-10 Units into the skin 3 (three) times daily with meals. Inject SQ per sliding scale 150-250 (5 units), 251-350(8 units), 351-450(10 units), >450=10 units   04/05/2017  . lansoprazole (PREVACID) 30 MG capsule Take 30 mg by mouth 2 (two) times daily.  12 04/05/2017  . metoCLOPramide (REGLAN) 10 MG tablet Take 10 mg by mouth at bedtime.    04/05/2017  . pioglitazone (ACTOS) 30 MG tablet Take 30 mg by mouth at bedtime.    04/05/2017  . polyethylene glycol (MIRALAX / GLYCOLAX) packet Take 17 g by mouth daily. (Patient taking differently: Take 17 g by mouth daily as needed for moderate constipation. ) 14 each 0 Past Week at Unknown time  . prochlorperazine (COMPAZINE) 10 MG tablet Take 10 mg by mouth daily as needed for nausea/vomiting.  0 04/04/2017  . promethazine (PHENERGAN) 25 MG suppository Place 25 mg rectally every 6 (six) hours as needed for nausea or vomiting.   04/06/2017 at Unknown time  . promethazine (PHENERGAN) 25 MG tablet Take 25 mg by mouth every 8 (eight) hours as needed for nausea or vomiting. Take one tablet by mouth every 8 hours as needed nausea/vomiting   Past Week at Unknown time  . quinapril (ACCUPRIL) 5 MG tablet Take 5 mg by mouth daily.  12 04/05/2017  . ranitidine (ZANTAC) 150 MG tablet Take 150 mg by mouth at bedtime.    04/05/2017  . simvastatin (ZOCOR) 40 MG tablet Take 60 mg by mouth daily at 6 PM. Take one and half tablet by mouth once daily in the evening for cholesterol   04/05/2017  . traMADol (ULTRAM) 50 MG tablet Take 100 mg by mouth every 12 (twelve) hours as needed (pain).   04/05/2017  . apixaban (ELIQUIS) 5 MG TABS tablet Take 2 tablets (10 mg total) by mouth 2 (two) times daily. 14 tablet 0   . apixaban (ELIQUIS) 5 MG TABS  tablet Take 1 tablet (5 mg total) by mouth 2 (two) times daily. (Patient not taking: Reported on 04/07/2017) 60 tablet 0 Not Taking at Unknown time    Family History  Problem Relation Age of Onset  . COPD Mother   . Heart disease Mother   . Lung cancer Mother   . Retinal detachment Father   . Alcoholism Brother   . Heart  disease Brother   . COPD Brother   . Diabetes Brother   . Hypertension Brother   . Stroke Brother    Review of Systems  Constitutional: Positive for chills, diaphoresis, fever, malaise/fatigue and weight loss.  HENT: Negative for congestion, ear discharge, ear pain, hearing loss, nosebleeds, sinus pain, sore throat and tinnitus.   Eyes: Positive for blurred vision and photophobia. Negative for double vision, pain, discharge and redness.       + cateracts  Respiratory: Positive for shortness of breath and wheezing. Negative for cough, hemoptysis, sputum production and stridor.   Cardiovascular: Positive for chest pain, orthopnea and PND. Negative for palpitations, claudication and leg swelling.  Gastrointestinal: Positive for abdominal pain, diarrhea, heartburn, nausea and vomiting. Negative for blood in stool, constipation and melena.  Genitourinary: Negative.   Musculoskeletal: Positive for back pain, falls, joint pain, myalgias and neck pain.  Skin: Negative.   Neurological: Positive for dizziness, tingling, sensory change and weakness. Negative for tremors, speech change, focal weakness, seizures, loss of consciousness and headaches.  Endo/Heme/Allergies: Positive for environmental allergies and polydipsia. Bruises/bleeds easily.  Psychiatric/Behavioral: Positive for memory loss. Negative for depression, hallucinations, substance abuse and suicidal ideas. The patient is nervous/anxious and has insomnia.    Physical Exam: BP 138/82 (BP Location: Left Arm)   Pulse 80   Temp 98.2 F (36.8 C) (Oral)   Resp 19   Ht 6\' 3"  (1.905 m)   Wt 283 lb 4.7 oz (128.5 kg)    SpO2 95%   BMI 35.41 kg/m    General appearance: alert, cooperative, appears older than stated age and no distress Head: Normocephalic, without obvious abnormality, atraumatic Neck: no adenopathy, no carotid bruit, no JVD, supple, symmetrical, trachea midline and thyroid not enlarged, symmetric, no tenderness/mass/nodules Lymph nodes: Cervical, supraclavicular, and axillary nodes normal. Resp: clear to auscultation bilaterally Back: symmetric, no curvature. ROM normal. No CVA tenderness. Cardio: regular rate and rhythm, S1, S2 normal, no murmur, click, rub or gallop GI: obese with epigastric and LUQ tenderness to palp Extremities: venous stasis dermatitis noted and Legt aks, right DP/PT is palpable Neurologic: Grossly normal  Diagnostic Studies & Laboratory data:     Recent Radiology Findings:   Ct Angio Chest Pe W/cm &/or Wo Cm  Result Date: 04/07/2017 CLINICAL DATA:  66 year old male with acute shortness of breath and chest pain. EXAM: CT ANGIOGRAPHY CHEST WITH CONTRAST TECHNIQUE: Multidetector CT imaging of the chest was performed using the standard protocol during bolus administration of intravenous contrast. Multiplanar CT image reconstructions and MIPs were obtained to evaluate the vascular anatomy. CONTRAST:  100 cc intravenous Isovue 370 COMPARISON:  11/18/2016 and prior chest CTs. 04/07/2017 chest radiograph FINDINGS: Cardiovascular: This is a technically adequate study although motion artifact in the lower lungs decreases sensitivity. No pulmonary emboli are identified. Upper limits normal heart size noted with heavy coronary artery calcifications. Thoracic aortic atherosclerotic calcifications noted without aneurysm. No pericardial effusion. Mediastinum/Nodes: No enlarged mediastinal, hilar, or axillary lymph nodes. Thyroid gland, trachea, and esophagus demonstrate no significant findings. Lungs/Pleura: Mild subsegmental atelectasis/ scarring within the mid-lower right lung and left  lung base again noted. No airspace disease, consolidation, nodule, mass, pleural effusion or pneumothorax noted. Upper Abdomen: No acute abnormality Musculoskeletal: No chest wall abnormality. No acute or significant osseous findings. Review of the MIP images confirms the above findings. IMPRESSION: 1. No evidence of acute abnormality. No evidence of pulmonary emboli. 2. Upper limits normal heart size and coronary artery disease 3. Mild subsegmental atelectasis/scarring within  the mid and lower lungs. 4.  Aortic Atherosclerosis (ICD10-I70.0). Electronically Signed   By: Harmon Pier M.D.   On: 04/07/2017 15:23   Dg Chest Port 1 View  Result Date: 04/07/2017 CLINICAL DATA:  Chest pain. EXAM: PORTABLE CHEST 1 VIEW COMPARISON:  February 19, 2017 FINDINGS: The heart size and mediastinal contours are within normal limits. Both lungs are clear. The visualized skeletal structures are unremarkable. IMPRESSION: No active disease. Electronically Signed   By: Gerome Sam III M.D   On: 04/07/2017 13:50   Runell Gess, MD (Primary)    Procedures   LEFT HEART CATH AND CORONARY ANGIOGRAPHY  Conclusion     Ost RCA to Prox RCA lesion, 100 %stenosed.  Ost Cx to Prox Cx lesion, 80 %stenosed.  Mid LAD lesion, 0 %stenosed.  There is mild left ventricular systolic dysfunction.  LV end diastolic pressure is mildly elevated.  The left ventricular ejection fraction is 50-55% by visual estimate.   Samuel [name] is a 66 y.o. male    [id number] LOCATION:  FACILITY: [facility]  PHYSICIAN: Nanetta Batty, M.D. 1950-10-18   DATE OF PROCEDURE:  04/08/2017  DATE OF DISCHARGE:     CARDIAC CATHETERIZATION     History obtained from chart review.66 y.o.malewith medical history significant of CAD s/p stenting in 2010, DM, PE quit taking eloquis due to cost issues, GERD, OSA, HTN comes in with 2 days of epigastric /sscp that does not radiate but associated with a lot of nausea and vomiting. He  has been feeling awful since Saturday 10/19 AM.He thought he was coming down with the flu. The pain is "pressure" in his chest and has become more persistent and is worse when he is vomiting. No fevers. No cough. His glucose has been more elevated more than usual also with glucose over 300 and he hasnt been able to eat either. Pt found to have elevated troponin level and referred for admission for NSTEMI. He presents today for cardiac catheterization to rule out an ischemic etiology.    IMPRESSION:Mr. Medina has 2 vessel disease. This dominant RCA is occluded at its origin and he fills the distal vessel by left-to-right collaterals (grade 3). He has a high-grade ostial/proximal nondominant circumflex comes off the left main at a 90 angle and is heavily calcified. It gives rise to a large first marginal branch and a smaller second marginal branch. The mid LAD stent is widely patent that was placed in 2010 by myself. He has low normal EF in the 50% range. I think he is a bypass candidate. Unfortunately his LAD does not have significant disease. The circumflex is highly calcified and would require orbital rotational atherectomy which is somewhat high risk off of the left main within the 90 angle. I'm going to restart IV heparin. We will get the surgeons to evaluate him. The sheath was removed and a TR band was placed on the right wrist to achieve patent hemostasis. The patient left the lab in stable condition.  Nanetta Batty. MD, [facility] 04/08/2017 6:52 PM      Indications   Non-STEMI (non-ST elevated myocardial infarction) (HCC) [I21.4 (ICD-10-CM)]  Procedural Details/Technique   Technical Details PROCEDURE DESCRIPTION:   The patient was brought to the second floor [facility] cath lab in the postabsorptive state. He was  premedicated with valium 5 mg by mouth, IV Versed and fentanyl. His right wrist was prepped and shaved in usual sterile fashion. Xylocaine 1% was used for  local anesthesia. A 6 [place]  sheath was inserted into the right radial artery using standard Seldinger technique. The patient received 6500 units of heparin intravenously. 5 [place] TIG and pigtail catheters were used for selective coronary angiography and left ventriculography respectively. Isovue dye was used for the entirety of the case. Retrograde aortic, left ventricular and pullback pressures were recorded. The patient received radial cocktail via the SideArm sheath.   Estimated blood loss <50 mL.  During this procedure the patient was administered the following to achieve and maintain moderate conscious sedation: Versed 1 mg, Fentanyl 25 mcg, while the patient's heart rate, blood pressure, and oxygen saturation were continuously monitored. The period of conscious sedation was 15 minutes, of which I was present face-to-face 100% of this time.    Coronary Findings   Dominance: Right  Left Anterior Descending  Mid LAD lesion, 0% stenosed. Mid LAD lesion with no stenosis was previously treated.  Left Circumflex  Ost Cx to Prox Cx lesion, 80% stenosed. The lesion is calcified.  Right Coronary Artery  Ost RCA to Prox RCA lesion, 100% stenosed.  Right Posterior Descending Artery  RPDA filled by collaterals from Dist LAD.  Wall Motion              Left Heart   Left Ventricle The left ventricular size is normal. There is mild left ventricular systolic dysfunction. LV end diastolic pressure is mildly elevated. The left ventricular ejection fraction is 50-55% by visual estimate. There are LV function abnormalities. Mild anteroapical hypokinesia    Coronary Diagrams   Diagnostic Diagram       Implants     No implant documentation for this case.  MERGE Images   Show images for CARDIAC CATHETERIZATION   Link to Procedure Log   Procedure Log    Hemo Data    Most Recent Value  AO Systolic Pressure 122 mmHg  AO Diastolic Pressure 68 mmHg  AO Mean 92 mmHg  LV Systolic Pressure  123 mmHg  LV Diastolic Pressure 9 mmHg  LV EDP 19 mmHg  Arterial Occlusion Pressure Extended Systolic Pressure 130 mmHg  Arterial Occlusion Pressure Extended Diastolic Pressure 69 mmHg  Arterial Occlusion Pressure Extended Mean Pressure 95 mmHg  Left Ventricular Apex Extended Systolic Pressure 128 mmHg  Left Ventricular Apex Extended Diastolic Pressure 4 mmHg  Left Ventricular Apex Extended EDP Pressure 28 mmHg  Order-Level Documents:   There are no order-level documents.  Encounter-Level Documents - 04/07/2017:   Scan on 04/08/2017 6:48 PM by Default, Provider, MD  Scan on 04/08/2017 10:37 AM by Default, Provider, MD  Scan on 04/09/2017 4:12 AM by Default, Provider, MD  Scan on 04/09/2017 9:05 AM by Default, Provider, MD  Document on 04/07/2017 7:15 PM by Tilden Fossa, MD : ED PB Summary  Electronic signature on 04/07/2017 4:54 PM  Scan on 04/07/2017 12:22 PM by Default, Provider, MD      Signed   Electronically signed by Runell Gess, MD on 04/08/17 at 1857 EDT  External Result Report   External Result Report     I have independently reviewed the above radiologic studies.  Recent Lab Findings: Lab Results  Component Value Date   WBC 8.3 04/09/2017   HGB 12.4 (L) 04/09/2017   HCT 37.1 (L) 04/09/2017   PLT 195 04/09/2017   GLUCOSE 121 (H) 04/09/2017   CHOL 140 04/08/2017   TRIG 96 04/08/2017   HDL 32 (L) 04/08/2017   LDLCALC 89 04/08/2017   ALT 20 04/07/2017   AST 21 04/07/2017  NA 138 04/09/2017   K 3.4 (L) 04/09/2017   CL 107 04/09/2017   CREATININE 0.98 04/09/2017   BUN 20 04/09/2017   CO2 23 04/09/2017   TSH 1.432 01/03/2010   INR 1.02 04/07/2017   HGBA1C 9.6 (H) 04/07/2017   PFT done today  FEV! 0.7 16 % unable to give much effort   Assessment / Plan:  Severe two-vessel coronary artery disease of RCA and CX system, with previous LAD stent. He has persistent nausea and epigastric discomfort which may be gastroparesis, with daily  vomiting Although anatomically he could be a candidate for coronary artery bypass grafting however his multiple other problems make him extremely poor candidate to make any kind of meaningful recovery after major surgical intervention.   When I examined the patient today he is having a shaking chill, no fever He has severe obstructive pulmonary disease with FEV1 of 0.7, and was unable to give much of a respiratory effort during testing. Discussed with the patient the risks and options of bypass surgery.  I do not believe the patient would make a meaningful recovery if we attempted coronary artery bypass graft.  Patient was clear he did not wish to have heroic efforts done.   Delight Ovens MD      [address] [place].[address] [place] [address] Office [phone]   Beeper [phone]

## 2017-04-09 NOTE — Progress Notes (Signed)
ANTICOAGULATION CONSULT NOTE - Follow Up Consult  Pharmacy Consult for heparin Indication: CAD awaiting surgical eval  Labs:  Recent Labs  04/07/17 1204 04/07/17 1838 04/07/17 2022 04/08/17 0018 04/08/17 0707 04/09/17 0349  HGB 14.9  --   --   --  13.1 12.4*  HCT 44.2  --   --   --  38.6* 37.1*  PLT 241  --   --   --  227 195  APTT 28  --   --   --   --   --   LABPROT 13.3  --   --   --   --   --   INR 1.02  --   --   --   --   --   HEPARINUNFRC  --   --  0.58 0.50  --  0.62  CREATININE 1.37*  --   --   --  1.38* 0.98  TROPONINI 0.32* 0.75*  --  2.64* 5.40*  --     Assessment/Plan:  66yo male therapeutic on heparin after resumed post-cath. Will continue gtt at current rate monitor daily level.   Vernard GamblesVeronda Kassiah Mccrory, PharmD, BCPS  04/09/2017,4:36 AM

## 2017-04-10 ENCOUNTER — Encounter (HOSPITAL_COMMUNITY)
Admission: EM | Disposition: A | Discharge status: Home Health | Payer: Self-pay | Source: Home / Self Care | Attending: Family Medicine

## 2017-04-10 ENCOUNTER — Telehealth: Payer: Self-pay | Admitting: Pharmacist

## 2017-04-10 LAB — BASIC METABOLIC PANEL
Anion gap: 10 (ref 5–15)
BUN: 14 mg/dL (ref 6–20)
CALCIUM: 8.9 mg/dL (ref 8.9–10.3)
CO2: 25 mmol/L (ref 22–32)
Chloride: 102 mmol/L (ref 101–111)
Creatinine, Ser: 0.91 mg/dL (ref 0.61–1.24)
GFR calc Af Amer: >60 mL/min (ref >60)
GLUCOSE: 137 mg/dL — AB (ref 65–99)
POTASSIUM: 3.4 mmol/L — AB (ref 3.5–5.1)
SODIUM: 137 mmol/L (ref 135–145)

## 2017-04-10 LAB — CBC
HCT: 39.8 % (ref 39.0–52.0)
HEMOGLOBIN: 13.2 g/dL (ref 13.0–17.0)
MCH: 30 pg (ref 26.0–34.0)
MCHC: 33.2 g/dL (ref 30.0–36.0)
MCV: 90.5 fL (ref 78.0–100.0)
PLATELETS: 219 10*3/uL (ref 150–400)
RBC: 4.4 MIL/uL (ref 4.22–5.81)
RDW: 13.3 % (ref 11.5–15.5)
WBC: 7.5 10*3/uL (ref 4.0–10.5)

## 2017-04-10 LAB — HEPARIN LEVEL (UNFRACTIONATED): HEPARIN UNFRACTIONATED: 0.46 [IU]/mL (ref 0.30–0.70)

## 2017-04-10 LAB — GLUCOSE, CAPILLARY
GLUCOSE-CAPILLARY: 148 mg/dL — AB (ref 65–99)
Glucose-Capillary: 128 mg/dL — ABNORMAL HIGH (ref 65–99)
Glucose-Capillary: 161 mg/dL — ABNORMAL HIGH (ref 65–99)
Glucose-Capillary: 176 mg/dL — ABNORMAL HIGH (ref 65–99)
Glucose-Capillary: 183 mg/dL — ABNORMAL HIGH (ref 65–99)

## 2017-04-10 LAB — MAGNESIUM: MAGNESIUM: 1.9 mg/dL (ref 1.7–2.4)

## 2017-04-10 SURGERY — CORONARY ARTERY BYPASS GRAFTING (CABG)
Anesthesia: General | Site: Chest

## 2017-04-10 MED ORDER — INSULIN ASPART 100 UNIT/ML ~~LOC~~ SOLN: 0.0000 [IU] | Freq: Every day | SUBCUTANEOUS | Status: DC

## 2017-04-10 MED ORDER — INSULIN ASPART 100 UNIT/ML ~~LOC~~ SOLN
0.0000 [IU] | Freq: Three times a day (TID) | SUBCUTANEOUS | Status: DC
  Administered 2017-04-10 (×2): 3 [IU] via SUBCUTANEOUS
  Administered 2017-04-11: 5 [IU] via SUBCUTANEOUS
  Administered 2017-04-11: 2 [IU] via SUBCUTANEOUS

## 2017-04-10 MED ORDER — INSULIN DETEMIR 100 UNIT/ML ~~LOC~~ SOLN
65.0000 [IU] | Freq: Two times a day (BID) | SUBCUTANEOUS | Status: DC
  Administered 2017-04-10: 65 [IU] via SUBCUTANEOUS
  Filled 2017-04-10 (×3): qty 0.65

## 2017-04-10 MED ORDER — FUROSEMIDE 20 MG PO TABS
20.0000 mg | ORAL_TABLET | Freq: Every day | ORAL | Status: DC
  Administered 2017-04-11: 20 mg via ORAL
  Filled 2017-04-10: qty 1

## 2017-04-10 MED ORDER — CLOPIDOGREL BISULFATE 75 MG PO TABS
300.0000 mg | ORAL_TABLET | Freq: Once | ORAL | Status: AC
  Administered 2017-04-10: 300 mg via ORAL
  Filled 2017-04-10: qty 4

## 2017-04-10 MED ORDER — POTASSIUM CHLORIDE CRYS ER 20 MEQ PO TBCR
60.0000 meq | EXTENDED_RELEASE_TABLET | Freq: Once | ORAL | Status: AC
  Administered 2017-04-10: 60 meq via ORAL
  Filled 2017-04-10: qty 3

## 2017-04-10 MED ORDER — RANOLAZINE ER 500 MG PO TB12
500.0000 mg | ORAL_TABLET | Freq: Two times a day (BID) | ORAL | Status: DC
  Administered 2017-04-10 – 2017-04-11 (×2): 500 mg via ORAL
  Filled 2017-04-10 (×2): qty 1

## 2017-04-10 MED ORDER — METOPROLOL TARTRATE 25 MG PO TABS
25.0000 mg | ORAL_TABLET | Freq: Two times a day (BID) | ORAL | Status: DC
  Administered 2017-04-10 – 2017-04-11 (×2): 25 mg via ORAL
  Filled 2017-04-10 (×2): qty 1

## 2017-04-10 MED ORDER — CLOPIDOGREL BISULFATE 75 MG PO TABS
75.0000 mg | ORAL_TABLET | Freq: Every day | ORAL | Status: DC
  Administered 2017-04-11: 75 mg via ORAL
  Filled 2017-04-10: qty 1

## 2017-04-10 NOTE — Evaluation (Signed)
Physical Therapy Evaluation Patient Details Name: Samuel [name] MRN: [id number] DOB: [birth year] Today's Date: 04/10/2017   History of Present Illness  66 y.o. male with medical history significant of CAD s/p stenting in 2010, DM, PE quit taking eloquis due to cost issues, GERD, OSA, HTN comes in with 2 days of epigastric /sscp that does not radiate but associated with a lot of nausea and vomiting.  He has been feeling awful since Saturday 10/19 AM.  He thought he was coming down with the flu.  The pain is "pressure" in his chest and has become more persistent and is worse when he is vomiting.  No fevers.  No cough.  His glucose has been more elevated more than usual also with glucose over 300 and he hasnt been able to eat either.  Pt found to have elevated troponin level and referred for admission for NSTEMI.  Needs CABG or PCI but does not qualify due to risks.   Past Medical History:  Diagnosis Date  . Anxiety   . Arthritis    "all over"   . CAD (coronary artery disease)   . Charcot's joint    "left foot"  . 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 Depression   . Gastroparesis   . GERD (gastroesophageal reflux disease)   . H/O hiatal hernia   . Hyperlipidemia   . Hypertension   . OSA (obstructive sleep apnea)    "not bad enough for a mask"  . Peripheral neuropathy   . Peripheral vascular disease (HCC)   . PONV (postoperative nausea and vomiting)   . Pulmonary embolism (HCC)    hx. of 2012  . Shortness of breath    exertion  . Type II diabetes mellitus (HCC)     Clinical Impression  Pt admitted with above diagnosis. Pt currently with functional limitations due to the deficits listed below (see PT Problem List). Pt reports he was up earlier and did not want to get OOB again.  Pt came to EOB without assist.  Pt did not need assist to get into wheelchair earlier.  Pt states he is at baseline for the most part but would like to gain endurance therefore will  recommend HHPT for pt.  Will follow acutely. Pt will benefit from skilled PT to increase their independence and safety with mobility to allow discharge to the venue listed below.      Follow Up Recommendations Home health PT;Supervision - Intermittent    Equipment Recommendations  None recommended by PT    Recommendations for Other Services       Precautions / Restrictions Precautions Precautions: Fall Precaution Comments: has left LE prosthesis but does not wear it per pt Restrictions Weight Bearing Restrictions: No      Mobility  Bed Mobility Overal bed mobility: Independent                Transfers                 General transfer comment: did not want to get up as he was up earlier  Ambulation/Gait                Stairs            Wheelchair Mobility    Modified Rankin (Stroke Patients Only)       Balance Overall balance assessment: Needs assistance Sitting-balance support: Feet supported;No upper extremity supported Sitting balance-Leahy Scale: Fair  Pertinent Vitals/Pain Pain Assessment: No/denies pain  VSS  Home Living Family/patient expects to be discharged to:: Private residence Living Arrangements: Alone Available Help at Discharge: Family;Available PRN/intermittently (cousins are caregivers and live next door and are there almost all the time) Type of Home: Apartment ([place]) Home Access: Level entry     Home Layout: One level Home Equipment: Tub bench;Grab bars - tub/shower;Grab bars - toilet;Hand held shower head;Walker - 4 wheels;Walker - 2 wheels;Wheelchair - Engineer, technical sales - power (lift chair)      Prior Function Level of Independence: Independent with assistive device(s)         Comments: can transfer unaided and use wheelchair in the home, needs PT for walking with prosthetic and walker - pt reports he hasn't walked in last 6 weeks due to decr  endurance (was getting HHPT until admission)     Hand Dominance        Extremity/Trunk Assessment   Upper Extremity Assessment Upper Extremity Assessment: Defer to OT evaluation    Lower Extremity Assessment Lower Extremity Assessment: Generalized weakness;LLE deficits/detail LLE Deficits / Details: 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     Cervical / Trunk Assessment Cervical / Trunk Assessment: Kyphotic  Communication   Communication: No difficulties  Cognition Arousal/Alertness: Awake/alert Behavior During Therapy: WFL for tasks assessed/performed Overall Cognitive Status: Within Functional Limits for tasks assessed                                        General Comments      Exercises General Exercises - Lower Extremity Long Arc Quad: AROM;Both;10 reps;Seated Hip Flexion/Marching: AROM;Both;10 reps;Seated   Assessment/Plan    PT Assessment Patient needs continued PT services  PT Problem List Decreased activity tolerance;Decreased balance;Decreased mobility;Decreased knowledge of use of DME;Decreased safety awareness;Decreased knowledge of precautions       PT Treatment Interventions DME instruction;Gait training;Functional mobility training;Therapeutic activities;Therapeutic exercise;Balance training;Patient/family education;Wheelchair mobility training    PT Goals (Current goals can be found in the Care Plan section)  Acute Rehab PT Goals Patient Stated Goal: to go home PT Goal Formulation: With patient Time For Goal Achievement: 04/24/17 Potential to Achieve Goals: Good    Frequency Min 3X/week   Barriers to discharge        Co-evaluation               AM-PAC PT "6 Clicks" Daily Activity  Outcome Measure Difficulty turning over in bed (including adjusting bedclothes, sheets and blankets)?: None Difficulty moving from lying on back to sitting on the side of the bed? : None Difficulty sitting  down on and standing up from a chair with arms (e.g., wheelchair, bedside commode, etc,.)?: Unable Help needed moving to and from a bed to chair (including a wheelchair)?: None Help needed walking in hospital room?: Total Help needed climbing 3-5 steps with a railing? : Total 6 Click Score: 15    End of Session Equipment Utilized During Treatment: Gait belt;Oxygen Activity Tolerance: Patient limited by fatigue Patient left: in bed;with call bell/phone within reach Nurse Communication: Mobility status PT Visit Diagnosis: Muscle weakness (generalized) (M62.81)    Time: 4098-1191 PT Time Calculation (min) (ACUTE ONLY): 13 min   Charges:   PT Evaluation $PT Eval Moderate Complexity: 1 Mod     PT G Codes:        Ad Guttman,PT Acute Rehabilitation [phone] (  pager)   Amadeo Garnetawn F Alfreda Hammad 04/10/2017, 1:39 PM

## 2017-04-10 NOTE — Telephone Encounter (Signed)
-----   Message from Carles ColletLavelda M Comer sent at 04/08/2017  2:18 PM EDT ----- Regarding: Referral: Order for Delorise JacksonHINSON,Nixon D Cloris Flippo  Referral from Tomi BambergerBrenda Graves-Bigelow, RN  "Please see below request"  Elza RafterLavelda ----- Message ----- From: Damien FusiGraves-Bigelow, Brenda K, RN Sent: 04/08/2017  12:19 PM To: [id number] Subject: Order for Samuel [name],Chantry D                         Patient Name: Samuel [name],[name]([id number]) Sex: Male DOB: 1950-10-21    PCP: Pearson GrippeKIM, JAMES   Center: [facility]                Types of orders made on 04/08/2017: Cardiac Cath, Diet, IV, Lab, Medications,                                      Nursing, Point of Care Testing-Docked Device  Order Date:04/08/2017 Ordering User:GRAVES-BIGELOW, Scherrie BatemanBRENDA K [[id number]][[id number]] Attending Provider:Rees, Lanora ManisElizabeth, MD [phone][[id number]] Authorizing Provider: Ozella RocksMerrell, David J, MD [phone][[id number]] Department:[facility] [phone][[phone]]  Order Specific Information Order: [facility] [Custom: [id number]]  Order #: [id number] Qty:         1   Priority: Routine  Class: Hospital Performed   Resulting Agency: [facility]  Test ID: [id number]     Reason for consult -> In for NStemi- Pt needs assistance with getting                         Eliquis approved via patient assistance. PCP Pearson GrippeJames Kim.                          [facility] work with patient for                         assistance application. Application has been started                         over a month ago.       Expected date of contact -> 1-3 days (reserved for hospital discharges)     Released on: 04/08/2017 12:19 PM     Priority: Routine  Class: Hospital Performed   Resulting Agency: [facility]  Test ID: [id number]     Reason for consult -> In for NStemi- Pt needs assistance with getting                         Eliquis approved via patient assistance. PCP Pearson GrippeJames Kim.                          [facility] for                         assistance application. Application has been started                         over a month ago.       Expected date of contact -> 1-3 days (reserved for hospital discharges)     Released on: 04/08/2017 12:19 PM

## 2017-04-10 NOTE — Progress Notes (Signed)
Inpatient Diabetes Program Recommendations  AACE/ADA: New Consensus Statement on Inpatient Glycemic Control (2015)  Target Ranges:  Prepandial:   less than 140 mg/dL      Peak postprandial:   less than 180 mg/dL (1-2 hours)      Critically ill patients:  140 - 180 mg/dL   Lab Results  Component Value Date   GLUCAP 148 (H) 04/10/2017   HGBA1C 9.6 (H) 04/07/2017    Review of Glycemic ControlResults for Beckie BusingHINSON, Loni D (MRN [id number]) as of 04/10/2017 11:04  Ref. Range 04/09/2017 08:06 04/09/2017 11:51 04/09/2017 16:07 04/10/2017 00:11 04/10/2017 04:02 04/10/2017 08:04  Glucose-Capillary Latest Ref Range: 65 - 99 mg/dL 91 87 782121 (H) 956176 (H) 213128 (H) 148 (H)   Diabetes history: Type 2 diabetes Outpatient Diabetes medications: Invokana 100 mg alternating with Jardiance 10 mg weekly, Levemir 85 units bid, Humalog 0-10 units tid with meals, Actos 30 mg daily Current orders for Inpatient glycemic control:  Levemir 85 units bid, IV insulin on call to surgery  Inpatient Diabetes Program Recommendations:    Called and spoke to RN and she states that patient is not going for surgery today.  Text paged MD to request Novolog moderate correction tid with meals and HS.  Also recommend reducing Levemir to 65 units bid.  Thanks,  Beryl MeagerJenny Corabelle Spackman, RN, BC-ADM Inpatient Diabetes Coordinator Pager [phone] (8a-5p)

## 2017-04-10 NOTE — Patient Outreach (Addendum)
[facility] Care Management  04/10/2017  Samuel [name] [birth year] [id number]   Referral was received stating patient was having difficulty affording Eliquis and that the patient was already approved with Eliquis Patient Assistance Program. Referral requested [facility] help with the application.  Patient is still hospitalized. He was called but did not answer. HIPAA compliant message was left on his voicemail.  Alver [name] Patient Assistance Program was called on the patient's behalf. [name] is the Regulatory affairs officeractual administrator of the patient assistance program that provides Eliquis. The representative stated an application was received for the patient but it was missing several key components:  1. Page 4 of the application (the provider's section) was not completed and needs to be redone and signed by the provider. Once completed and signed, it can be re-faxed on the patient's behalf.  2.  Patient did not provide proof of income or proof of medication expenses. (Since patient is hospitalized, [facility]ison, [name] was called and it was requested that she ask the patient about getting his proof of income (tax return or Social Security Benefits Award Letter).  I will request a [name] from [employer].  Plan:  Send patient a Patient Assistance Letter requesting the necessary information to complete his application. Send Dr. Elmyra RicksKim's office a letter requesting they redo page 4 of the patient's application. Follow up with the patient in 7-10 business days.   Beecher McardleKatina J. Nas Wafer, PharmD, BCACP [facility] Clinical Pharmacist [phone]

## 2017-04-10 NOTE — Progress Notes (Signed)
Progress Note  Patient Name: Samuel [name] Date of Encounter: 04/10/2017  Primary Cardiologist: Allyson Sabal - last seen in 2014 Requesting MD: Dr. Margot Ables  Subjective   He looks & feels much better -- No more CP or SOB @ rest. Tired after ~3 hrs up in chair. Nausea is notably improved & he had a significant BM earlier today. In good spirits  Inpatient Medications    Scheduled Meds: . albuterol  2.5 mg Nebulization Once  . amLODipine  10 mg Oral Daily  . aspirin EC  81 mg Oral Daily  . atorvastatin  80 mg Oral q1800  . clopidogrel  300 mg Oral Once  . [START ON 04/11/2017] clopidogrel  75 mg Oral Daily  . gabapentin  300 mg Oral TID  . heparin-papaverine-plasmalyte irrigation   Irrigation To OR  . insulin aspart  0-15 Units Subcutaneous TID WC  . insulin aspart  0-5 Units Subcutaneous QHS  . insulin detemir  65 Units Subcutaneous BID  . magnesium sulfate  40 mEq Other To OR  . metoCLOPramide (REGLAN) injection  10 mg Intravenous TID AC  . metoprolol tartrate  25 mg Oral BID  . nitroGLYCERIN  0.5 inch Topical Q6H  . ranolazine  500 mg Oral BID  . sodium chloride flush  3 mL Intravenous Q12H  . sodium chloride flush  3 mL Intravenous Q12H  . tranexamic acid  15 mg/kg Intravenous To OR  . tranexamic acid  2 mg/kg Intracatheter To OR   Continuous Infusions: . sodium chloride    . sodium chloride    . dexmedetomidine    . heparin 30,000 units/NS 1000 mL solution for CELLSAVER    . heparin 1,500 Units/hr (04/10/17 0729)  . insulin (NOVOLIN-R) infusion    . nitroGLYCERIN    . phenylephrine 20mg /253mL NS (0.08mg /ml) infusion    . tranexamic acid (CYKLOKAPRON) infusion ([facility])    . vancomycin     PRN Meds: sodium chloride, sodium chloride, acetaminophen, morphine injection, nitroGLYCERIN, ondansetron (ZOFRAN) IV, promethazine, sodium chloride flush, sodium chloride flush, traMADol   Vital Signs    Vitals:   04/10/17 0801 04/10/17 0919 04/10/17 1124 04/10/17 1556  BP:  133/72  135/72 133/76  Pulse: 76 93 80 80  Resp: 20  (!) 21 (!) 22  Temp: 98 F (36.7 C)  98.4 F (36.9 C) 97.8 F (36.6 C)  TempSrc: Oral  Oral Oral  SpO2: 96%  96% 95%  Weight:      Height:        Intake/Output Summary (Last 24 hours) at 04/10/17 1700 Last data filed at 04/10/17 1518  Gross per 24 hour  Intake           918.25 ml  Output             1750 ml  Net          -831.75 ml   Filed Weights   04/07/17 1842 04/08/17 0424 04/09/17 0427  Weight: 281 lb 4.8 oz (127.6 kg) 276 lb 14.4 oz (125.6 kg) 283 lb 4.7 oz (128.5 kg)    Telemetry    NSR ~70-80s with PVCs- Personally Reviewed  ECG    No new EKG findings - Personally Reviewed  Physical Exam   GEN:  obese. NAD. Heavy beard.  Looks much "perkier" & healthy today. Neck: no obvious JVD - hard to detect with beard & body habitus. Cardiac:  distant S1 & S2.  Soft SM; no R/G. Respiratory:  mostly CTAB -  non-labored. 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 L BKA.[name] Neuro:  non-focal Psych: Much more pleasant / happy mood & affect.  Labs    Chemistry  Recent Labs Lab 04/07/17 1204 04/08/17 0707 04/09/17 0349 04/10/17 0346  NA 137 134* 138 137  K 4.1 3.1* 3.4* 3.4*  CL 99* 105 107 102  CO2 17* 17* 23 25  GLUCOSE 291* 136* 121* 137*  BUN 19 23* 20 14  CREATININE 1.37* 1.38* 0.98 0.91  CALCIUM 9.6 8.8* 8.6* 8.9  PROT 8.1  --   --   --   ALBUMIN 3.9  --   --   --   AST 21  --   --   --   ALT 20  --   --   --   ALKPHOS 63  --   --   --   BILITOT 1.5*  --   --   --   GFRNONAA 53* 53* >60 >60  GFRAA >60 >60 >60 >60  ANIONGAP  --  12 8 10      Hematology  Recent Labs Lab 04/08/17 0707 04/09/17 0349 04/10/17 0346  WBC 11.6* 8.3 7.5  RBC 4.30 4.10* 4.40  HGB 13.1 12.4* 13.2  HCT 38.6* 37.1* 39.8  MCV 89.8 90.5 90.5  MCH 30.5 30.2 30.0  MCHC 33.9 33.4 33.2  RDW 13.2 13.4 13.3  PLT 227 195 219    Cardiac Enzymes  Recent Labs Lab 04/07/17 1204  04/07/17 1838 04/08/17 0018 04/08/17 0707  TROPONINI 0.32* 0.75* 2.64* 5.40*     Recent Labs Lab 04/07/17 1218  TROPIPOC 0.26*     BNPNo results for input(s): BNP, PROBNP in the last 168 hours.   DDimer No results for input(s): DDIMER in the last 168 hours.   Radiology    No results found.  Cardiac Studies   No current studies  Last Cath Report: 05/2009 - (Dr. Karilyn Cota ~90%, Cx: distal AVGroove 50%;co-dom RCA 40%mid, 75% pRPDA --> PCI of mLAD Xience DES 2.75 x 15 (3.0 mm post-dilation).   EF ~60%  Echo 11/2016: EF 60-65%, Gr 1 DD..  Mild RV dilation.  LEFT HEART CATH AND CORONARY ANGIOGRAPHY: 04/08/2017     Ost RCA to Prox RCA lesion, 100 %stenosed.  Ost Cx to Prox Cx lesion, 80 %stenosed.  Mid LAD lesion, 0 %stenosed.  There is mild left ventricular systolic dysfunction.  LV end diastolic pressure is mildly elevated.  The left ventricular ejection fraction is 50-55% by visual estimate  -- Ostial circumflex be a difficult PCI target. The entire RCA is extensively occluded until collateralizations fill the PDA.  -- Not thought to be a good PCI candidate. CT surgical consult also.      Patient Profile     66 y.o. male with medical history significant of CAD s/p stenting in 2010, DM, PE quit taking eloquis due to cost issues, GERD, OSA, HTN comes in with 2 days of epigastric /sscp that does not radiate but associated with a lot of nausea and vomiting.  He has been feeling awful since Saturday 10/19 AM.  He thought he was coming down with the flu.  The pain is "pressure" in his chest and has become more persistent and is worse when he is vomiting.  No fevers.  No cough.  His glucose has been more elevated more than usual also with glucose over 300 and he hasnt been able to eat either.  Pt found  to have elevated troponin level and referred for admission for NSTEMI.   Assessment & Plan    Principal Problem:   NSTEMI (non-ST elevated myocardial infarction)  (HCC) Active Problems:   CAD S/P DES PCI to mLAD: Xience DES 2.75 x 15 (3.0 mm)   Diabetes mellitus type 2, uncontrolled (HCC)   Essential hypertension   Hyperlipidemia due to type 2 diabetes mellitus (HCC)   DM (diabetes mellitus), type 2 (HCC)   NAUSEA AND VOMITING   Pulmonary embolism (HCC)   Gastroparesis  Admitting Sx are more c/w possible viral illness, but now with CP & NSTEMI by Troponin, I think that the stress of his illness may have triggered ACS.  Plan:   NSTEMI - continue IV Heparin, ASA & statin.  started low dose Beta Blocker (Metoprolol 12.5 mg BID) & continue amlodipine; Increase statin to 80mg .  Cardiac cath on 10/22 showed Significant CAD - RCA CTO with severe Ostial Cx - best option is CABG.  Seen by CT Sgx - NOT SURGICAL CANDIDATE  I reviewed the films with Drs. Pamala DuffelBerry, Cooper, McAlhany & Ganji -- consensus is that Ostial CX PCI is also not favorable -- would difficult & high risk as a "last ditch effort" for intractable angina.  Plan for now is [facility].  Increase Metoprolol to 25 mg PO BID  Add Ranexa 500 mg BID (if covered by insurance) -- prefer over Imdur to avoid nausea side effect of Imdur.  Add low dose furosemide for DHF component   with significant disease. CT surgical consult called today. -> He had been seen by the PA when I went to go see him, at that time he was indicating that he may not be interested in the effort that it takes to do CABG.   For now, continue IV heparin until 9 PM (to complete 48 hr total).  Will load with Plavix & start mnt dose tomorrow.    IV NTG stopped.    Had been on ACE-I in the past - reassess BP in AM after increased BB.  Increasing dose of statin for hyperlipidemia  DM & Gastroparesis and [facility].   Based upon our discussions -he is happy re: plan for no CABG (does not want to take the risk) or PCI at this time.  Has clearly indicated DNR/DNI.  From a Cardiology standpoint - will need  to reassess tomorrow, but if able to get up & move around with no active angina or CHF Sx, could be ready for discharge.   For questions or updates, please contact [facility] Please consult [email or web address] for contact info under Cardiology/STEMI.      Signed, Bryan Lemmaavid Brodrick Curran, MD  04/10/2017, 5:00 PM

## 2017-04-10 NOTE — Consult Note (Signed)
   [facility] Consult   04/10/2017  Beckie BusingJohn D Percifield [birth year] [id number]  C[facility] to speak [facility] this morning regarding patient assistance for Eliquis.  Came by to speak with the patient regarding follow up needs and patient was sleeping soundly.  Will follow up with patient providing the information needed for the assistance. Will continue to follow with the request. Patient is still on Heparin drip and ongoing management for his post hospital needs are ongoing.   For questions, please contact:  Charlesetta ShanksVictoria Eleonore Shippee, RN BSN CCM [facility] Liaison  [phone] business mobile phone Toll free office [phone]

## 2017-04-10 NOTE — Progress Notes (Addendum)
Patient ID: Samuel [name] Garside, male   DOB: 1950-12-09, 66 y.o.   MRN: [id number]  PROGRESS NOTE    Samuel [name] Vidas  [id number] DOB: 1950-12-09 DOA: 04/07/2017 PCP: Pearson GrippeKim, James, MD   Brief Narrative:  66 year old male with history of coronary artery disease status post stenting in 2010, diabetes mellitus, PE quit taking eliquis due to cost issues, GERD, OSA, hypertension presented with epigastric/chest pain. He was found to have elevated troponin and was admitted for non-STEMI. Cardiology was consulted. Patient had cardiac catheterization on 04/08/2017 showing significant disease. Cardiothoracic surgery has evaluated the patient and patient is not surgical candidate because of multiple medical comorbidities.   Assessment & Plan:   Principal Problem:   NSTEMI (non-ST elevated myocardial infarction) (HCC) Active Problems:   NAUSEA AND VOMITING   Pulmonary embolism (HCC)   CAD S/P DES PCI to mLAD: Xience DES 2.75 x 15 (3.0 mm)   Essential hypertension   Hyperlipidemia due to type 2 diabetes mellitus (HCC)   Gastroparesis   DM (diabetes mellitus), type 2 (HCC)   Diabetes mellitus type 2, uncontrolled (HCC)    NSTEMI:  - Cardiac catheterization on 10/22: Significant disease - Cardiothoracic surgery has evaluated the patient and patient is not surgical candidate because of multiple medical comorbidities. - Currently chest pain free and on heparin drip. Follow further recommendations from cardiology - Continue ASA, Statin, metoprolol  N/V: - likely from gastroparesis and general ill feeling from occlusive CAD. Chronic issue for pt that flares from time to time.  - Continue Reglan  - Improving  DM: - continue Levemir  - SSI  HTN:  - Monitor blood pressure. 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 Repeat a.m. labs  DVT prophylaxis: Heparin Code Status:  Patient  wants to be DO NOT RESUSCITATE Family Communication: None at bedside Disposition Plan: Home in 1-2 days once cleared by cardiology  Consultants: Cardiology and cardiothoracic surgery  Procedures:  Carter catheterization on 04/08/2017  Ost RCA to Prox RCA lesion, 100 %stenosed.  Ost Cx to Prox Cx lesion, 80 %stenosed.  Mid LAD lesion, 0 %stenosed.  There is mild left ventricular systolic dysfunction.  LV end diastolic pressure is mildly elevated.  The left ventricular ejection fraction is 50-55% by visual estimate.   Antimicrobials: None Subjective: Patient seen and examined at bedside. he denies any current chest pain, nausea or vomiting.  Objective: Vitals:   04/10/17 0400 04/10/17 0801 04/10/17 0919 04/10/17 1124  BP: (!) 115/58 133/72  135/72  Pulse: 79 76 93 80  Resp:  20  (!) 21  Temp:  98 F (36.7 C)  98.4 F (36.9 C)  TempSrc:  Oral  Oral  SpO2: 97% 96%  96%  Weight:      Height:        Intake/Output Summary (Last 24 hours) at 04/10/17 1434 Last data filed at 04/10/17 0920  Gross per 24 hour  Intake           678.25 ml  Output             1400 ml  Net          -721.75 ml   Filed Weights   04/07/17 1842 04/08/17 0424 04/09/17 0427  Weight: 127.6 kg (281 lb 4.8 oz) 125.6 kg (276 lb 14.4 oz) 128.5 kg (283 lb 4.7 oz)    Examination:  General exam: Appears calm  and comfortable  Respiratory system: Bilateral decreased breath sound at bases Cardiovascular system: S1 & S2 heard, rate controlled  Gastrointestinal system: Abdomen is nondistended, soft and nontender. Normal bowel sounds heard. Extremities: No cyanosis, clubbing; left BKA  Data Reviewed: I have personally reviewed following labs and imaging studies  CBC:  Recent Labs Lab 04/07/17 1204 04/08/17 0707 04/09/17 0349 04/10/17 0346  WBC 12.4* 11.6* 8.3 7.5  NEUTROABS 11.2*  --   --   --   HGB 14.9 13.1 12.4* 13.2  HCT 44.2 38.6* 37.1* 39.8  MCV 90.2 89.8 90.5 90.5  PLT 241 227 195 219     Basic Metabolic Panel:  Recent Labs Lab 04/07/17 1204 04/08/17 0707 04/09/17 0349 04/10/17 0346  NA 137 134* 138 137  K 4.1 3.1* 3.4* 3.4*  CL 99* 105 107 102  CO2 17* 17* 23 25  GLUCOSE 291* 136* 121* 137*  BUN 19 23* 20 14  CREATININE 1.37* 1.38* 0.98 0.91  CALCIUM 9.6 8.8* 8.6* 8.9  MG  --   --   --  1.9   GFR: Estimated Creatinine Clearance: 118.4 mL/min (by C-G formula based on SCr of 0.91 mg/dL). Liver Function Tests:  Recent Labs Lab 04/07/17 1204  AST 21  ALT 20  ALKPHOS 63  BILITOT 1.5*  PROT 8.1  ALBUMIN 3.9    Recent Labs Lab 04/07/17 1204  LIPASE 17   No results for input(s): AMMONIA in the last 168 hours. Coagulation Profile:  Recent Labs Lab 04/07/17 1204  INR 1.02   Cardiac Enzymes:  Recent Labs Lab 04/07/17 1204 04/07/17 1838 04/08/17 0018 04/08/17 0707  TROPONINI 0.32* 0.75* 2.64* 5.40*   BNP (last 3 results) No results for input(s): PROBNP in the last 8760 hours. HbA1C:  Recent Labs  04/07/17 1838  HGBA1C 9.6*   CBG:  Recent Labs Lab 04/09/17 1607 04/10/17 0011 04/10/17 0402 04/10/17 0804 04/10/17 1117  GLUCAP 121* 176* 128* 148* 161*   Lipid Profile:  Recent Labs  04/08/17 0018  CHOL 140  HDL 32*  LDLCALC 89  TRIG 96  CHOLHDL 4.4   Thyroid Function Tests: No results for input(s): TSH, T4TOTAL, FREET4, T3FREE, THYROIDAB in the last 72 hours. Anemia Panel: No results for input(s): VITAMINB12, FOLATE, FERRITIN, TIBC, IRON, RETICCTPCT in the last 72 hours. Sepsis Labs: No results for input(s): PROCALCITON, LATICACIDVEN in the last 168 hours.  No results found for this or any previous visit (from the past 240 hour(s)).       Radiology Studies: No results found.      Scheduled Meds: . albuterol  2.5 mg Nebulization Once  . amLODipine  10 mg Oral Daily  . aspirin EC  81 mg Oral Daily  . atorvastatin  80 mg Oral q1800  . gabapentin  300 mg Oral TID  . heparin-papaverine-plasmalyte  irrigation   Irrigation To OR  . insulin aspart  0-15 Units Subcutaneous TID WC  . insulin aspart  0-5 Units Subcutaneous QHS  . insulin detemir  65 Units Subcutaneous BID  . magnesium sulfate  40 mEq Other To OR  . metoCLOPramide (REGLAN) injection  10 mg Intravenous TID AC  . metoprolol tartrate  12.5 mg Oral BID  . nitroGLYCERIN  0.5 inch Topical Q6H  . sodium chloride flush  3 mL Intravenous Q12H  . sodium chloride flush  3 mL Intravenous Q12H  . tranexamic acid  15 mg/kg Intravenous To OR  . tranexamic acid  2 mg/kg Intracatheter To OR  Continuous Infusions: . sodium chloride    . sodium chloride    . cefUROXime (ZINACEF)  IV    . cefUROXime (ZINACEF)  IV    . dexmedetomidine    . DOPamine    . epinephrine    . heparin 30,000 units/NS 1000 mL solution for CELLSAVER    . heparin 1,500 Units/hr (04/10/17 0729)  . insulin (NOVOLIN-R) infusion    . nitroGLYCERIN    . phenylephrine 20mg /251mL NS (0.08mg /ml) infusion    . tranexamic acid (CYKLOKAPRON) infusion ([facility])    . vancomycin       LOS: 3 days        Glade Lloyd, MD [facility] Pager [phone]  If 7PM-7AM, please contact night-coverage [email or web address] Password [facility] 04/10/2017, 2:34 PM

## 2017-04-10 NOTE — Progress Notes (Signed)
ANTICOAGULATION CONSULT NOTE - Follow-up Consult  Pharmacy Consult for Heparin Indication: 2vCAD  Patient Measurements: Height: 6\' 3"  (190.5 cm) Weight: 283 lb 4.7 oz (128.5 kg) IBW/kg (Calculated) : 84.5 Heparin Dosing Weight: 112 kg  Vital Signs: Temp: 98.4 F (36.9 C) (10/24 1124) Temp Source: Oral (10/24 1124) BP: 135/72 (10/24 1124) Pulse Rate: 80 (10/24 1124)  Labs:  Recent Labs  04/07/17 1204 04/07/17 1838  04/08/17 0018 04/08/17 0707 04/09/17 0349 04/10/17 0346  HGB 14.9  --   --   --  13.1 12.4* 13.2  HCT 44.2  --   --   --  38.6* 37.1* 39.8  PLT 241  --   --   --  227 195 219  APTT 28  --   --   --   --   --   --   LABPROT 13.3  --   --   --   --   --   --   INR 1.02  --   --   --   --   --   --   HEPARINUNFRC  --   --   < > 0.50  --  0.62 0.46  CREATININE 1.37*  --   --   --  1.38* 0.98 0.91  TROPONINI 0.32* 0.75*  --  2.64* 5.40*  --   --   < > = values in this interval not displayed. Estimated Creatinine Clearance: 118.4 mL/min (by C-G formula based on SCr of 0.91 mg/dL).  Assessment: [age] YOM who presented on 10/21 with ACS sx and underwent a cardiac cath 10/22 that revealed 2vCAD. TCTS has declined CABG as patient with multiple comorbidities. Awaiting potential option for PCI. Heparin infusion to continue at this time.   Heparin level therapeutic: 0.46; CBC stable, No bleeding documented  Goal of Therapy:  Heparin level 0.3-0.7 units/ml Monitor platelets by anticoagulation protocol: Yes   Plan:  1. Continue hepatin gtt at 1500 units/hr 2. Daily heparin level/CBC 3. Monitor for signs/symptoms of bleeding  4. F/U cardiology recommendations   Thank you for allowing pharmacy to be a part of this patient's care.  Ruben Imony Cleatus Goodin, PharmD Clinical Pharmacist 04/10/2017 11:30 AM

## 2017-04-11 ENCOUNTER — Other Ambulatory Visit: Payer: Self-pay | Admitting: Pharmacist

## 2017-04-11 DIAGNOSIS — I2782 Chronic pulmonary embolism: Secondary | ICD-10-CM

## 2017-04-11 DIAGNOSIS — K3184 Gastroparesis: Secondary | ICD-10-CM

## 2017-04-11 LAB — GLUCOSE, CAPILLARY
GLUCOSE-CAPILLARY: 73 mg/dL (ref 65–99)
GLUCOSE-CAPILLARY: 214 mg/dL — AB (ref 65–99)

## 2017-04-11 LAB — CBC
HCT: 40.5 % (ref 39.0–52.0)
Hemoglobin: 13.7 g/dL (ref 13.0–17.0)
MCH: 30.5 pg (ref 26.0–34.0)
MCHC: 33.8 g/dL (ref 30.0–36.0)
MCV: 90.2 fL (ref 78.0–100.0)
PLATELETS: 213 10*3/uL (ref 150–400)
RBC: 4.49 MIL/uL (ref 4.22–5.81)
RDW: 13.2 % (ref 11.5–15.5)
WBC: 6.1 10*3/uL (ref 4.0–10.5)

## 2017-04-11 LAB — HEPARIN LEVEL (UNFRACTIONATED): Heparin Unfractionated: <0.1 IU/mL — ABNORMAL LOW (ref 0.30–0.70)

## 2017-04-11 LAB — BASIC METABOLIC PANEL
ANION GAP: 9 (ref 5–15)
BUN: 11 mg/dL (ref 6–20)
CALCIUM: 9.2 mg/dL (ref 8.9–10.3)
CHLORIDE: 105 mmol/L (ref 101–111)
CO2: 25 mmol/L (ref 22–32)
CREATININE: 0.97 mg/dL (ref 0.61–1.24)
GFR calc non Af Amer: >60 mL/min (ref >60)
GLUCOSE: 97 mg/dL (ref 65–99)
Potassium: 3.8 mmol/L (ref 3.5–5.1)
Sodium: 139 mmol/L (ref 135–145)

## 2017-04-11 LAB — MAGNESIUM: Magnesium: 1.7 mg/dL (ref 1.7–2.4)

## 2017-04-11 MED ORDER — ATORVASTATIN CALCIUM 80 MG PO TABS: 80.0000 mg | ORAL_TABLET | Freq: Every day | ORAL | 0 refills | Status: AC

## 2017-04-11 MED ORDER — FUROSEMIDE 20 MG PO TABS: 20.0000 mg | ORAL_TABLET | Freq: Every day | ORAL | 0 refills | Status: DC

## 2017-04-11 MED ORDER — APIXABAN 5 MG PO TABS: 5.0000 mg | ORAL_TABLET | Freq: Two times a day (BID) | ORAL | 0 refills | Status: DC

## 2017-04-11 MED ORDER — ATORVASTATIN CALCIUM 80 MG PO TABS: 80.0000 mg | ORAL_TABLET | Freq: Every day | ORAL | 0 refills | Status: DC

## 2017-04-11 MED ORDER — ENOXAPARIN SODIUM 40 MG/0.4ML ~~LOC~~ SOLN
40.0000 mg | SUBCUTANEOUS | Status: DC
  Administered 2017-04-11: 40 mg via SUBCUTANEOUS
  Filled 2017-04-11: qty 0.4

## 2017-04-11 MED ORDER — ASPIRIN 81 MG PO TBEC: 81.0000 mg | DELAYED_RELEASE_TABLET | Freq: Every day | ORAL | 0 refills | Status: AC

## 2017-04-11 MED ORDER — RANOLAZINE ER 500 MG PO TB12: 500.0000 mg | ORAL_TABLET | Freq: Two times a day (BID) | ORAL | 0 refills | Status: DC

## 2017-04-11 MED ORDER — CLOPIDOGREL BISULFATE 75 MG PO TABS: 75.0000 mg | ORAL_TABLET | Freq: Every day | ORAL | 0 refills | Status: DC

## 2017-04-11 MED ORDER — NITROGLYCERIN 0.4 MG SL SUBL: 0.4000 mg | SUBLINGUAL_TABLET | SUBLINGUAL | 0 refills | Status: DC | PRN

## 2017-04-11 MED ORDER — METOPROLOL TARTRATE 25 MG PO TABS: 25.0000 mg | ORAL_TABLET | Freq: Two times a day (BID) | ORAL | 0 refills | Status: AC

## 2017-04-11 MED ORDER — FUROSEMIDE 20 MG PO TABS: 20.0000 mg | ORAL_TABLET | Freq: Every day | ORAL | 0 refills | Status: AC

## 2017-04-11 MED ORDER — METOPROLOL TARTRATE 25 MG PO TABS: 25.0000 mg | ORAL_TABLET | Freq: Two times a day (BID) | ORAL | 0 refills | Status: DC

## 2017-04-11 MED ORDER — ASPIRIN 81 MG PO TBEC: 81.0000 mg | DELAYED_RELEASE_TABLET | Freq: Every day | ORAL | 0 refills | Status: DC

## 2017-04-11 MED ORDER — RANOLAZINE ER 500 MG PO TB12: 500.0000 mg | ORAL_TABLET | Freq: Two times a day (BID) | ORAL | 0 refills | Status: AC

## 2017-04-11 MED ORDER — NITROGLYCERIN 0.4 MG SL SUBL: 0.4000 mg | SUBLINGUAL_TABLET | SUBLINGUAL | 0 refills | Status: AC | PRN

## 2017-04-11 MED ORDER — POLYETHYLENE GLYCOL 3350 17 G PO PACK: 17.0000 g | PACK | Freq: Every day | ORAL | Status: DC | PRN

## 2017-04-11 NOTE — Progress Notes (Signed)
Progress Note  Patient Name: Samuel [name] Date of Encounter: 04/11/2017  Primary Cardiologist: Allyson Sabal - last seen in 2014 Requesting MD: Dr. Margot Ables / Hanley Ben  Subjective   Feels well this aM - no more CP or Dyspnea. Nausea better  Inpatient Medications    Scheduled Meds: . albuterol  2.5 mg Nebulization Once  . amLODipine  10 mg Oral Daily  . aspirin EC  81 mg Oral Daily  . atorvastatin  80 mg Oral q1800  . clopidogrel  75 mg Oral Daily  . enoxaparin (LOVENOX) injection  40 mg Subcutaneous Q24H  . furosemide  20 mg Oral Daily  . gabapentin  300 mg Oral TID  . insulin aspart  0-15 Units Subcutaneous TID WC  . insulin aspart  0-5 Units Subcutaneous QHS  . insulin detemir  65 Units Subcutaneous BID  . metoCLOPramide (REGLAN) injection  10 mg Intravenous TID AC  . metoprolol tartrate  25 mg Oral BID  . nitroGLYCERIN  0.5 inch Topical Q6H  . ranolazine  500 mg Oral BID  . sodium chloride flush  3 mL Intravenous Q12H  . sodium chloride flush  3 mL Intravenous Q12H   Continuous Infusions: . sodium chloride    . sodium chloride     PRN Meds: sodium chloride, sodium chloride, acetaminophen, morphine injection, nitroGLYCERIN, ondansetron (ZOFRAN) IV, promethazine, sodium chloride flush, sodium chloride flush, traMADol   Vital Signs    Vitals:   04/11/17 0005 04/11/17 0441 04/11/17 0835 04/11/17 1212  BP: 136/72 136/71 130/75 (!) 132/92  Pulse:   83 77  Resp:   17 18  Temp: 98 F (36.7 C) 98.3 F (36.8 C) 98.2 F (36.8 C) (!) 97.5 F (36.4 C)  TempSrc: Oral Oral Oral Oral  SpO2:   99% 93%  Weight:      Height:        Intake/Output Summary (Last 24 hours) at 04/11/17 1338 Last data filed at 04/11/17 1100  Gross per 24 hour  Intake              240 ml  Output             1250 ml  Net            -1010 ml   Filed Weights   04/07/17 1842 04/08/17 0424 04/09/17 0427  Weight: 281 lb 4.8 oz (127.6 kg) 276 lb 14.4 oz (125.6 kg) 283 lb 4.7 oz (128.5 kg)     Telemetry    NSR - PVCs- Personally Reviewed  ECG    No new EKG - Personally Reviewed  Physical Exam   GEN:  obese, pleasant.  Looks well. Cardiac:  RRR (distant S1 & S2).  NO M/R/G Respiratory:  distant BS - but CTAB. GI:  obese, soft /NT/ND/NABS Psych: Much more pleasant / happy mood & affect.  Labs    Chemistry  Recent Labs Lab 04/07/17 1204  04/09/17 0349 04/10/17 0346 04/11/17 0327  NA 137  < > 138 137 139  K 4.1  < > 3.4* 3.4* 3.8  CL 99*  < > 107 102 105  CO2 17*  < > 23 25 25   GLUCOSE 291*  < > 121* 137* 97  BUN 19  < > 20 14 11   CREATININE 1.37*  < > 0.98 0.91 0.97  CALCIUM 9.6  < > 8.6* 8.9 9.2  PROT 8.1  --   --   --   --   ALBUMIN 3.9  --   --   --   --  AST 21  --   --   --   --   ALT 20  --   --   --   --   ALKPHOS 63  --   --   --   --   BILITOT 1.5*  --   --   --   --   GFRNONAA 53*  < > >60 >60 >60  GFRAA >60  < > >60 >60 >60  ANIONGAP  --   < > 8 10 9   < > = values in this interval not displayed.   Hematology  Recent Labs Lab 04/09/17 0349 04/10/17 0346 04/11/17 0327  WBC 8.3 7.5 6.1  RBC 4.10* 4.40 4.49  HGB 12.4* 13.2 13.7  HCT 37.1* 39.8 40.5  MCV 90.5 90.5 90.2  MCH 30.2 30.0 30.5  MCHC 33.4 33.2 33.8  RDW 13.4 13.3 13.2  PLT 195 219 213    Cardiac Enzymes  Recent Labs Lab 04/07/17 1204 04/07/17 1838 04/08/17 0018 04/08/17 0707  TROPONINI 0.32* 0.75* 2.64* 5.40*     Recent Labs Lab 04/07/17 1218  TROPIPOC 0.26*     BNPNo results for input(s): BNP, PROBNP in the last 168 hours.   DDimer No results for input(s): DDIMER in the last 168 hours.   Radiology    No results found.  Cardiac Studies   No current studies  Last Cath Report: 05/2009 - (Dr. Karilyn Cota ~90%, Cx: distal AVGroove 50%;co-dom RCA 40%mid, 75% pRPDA --> PCI of mLAD Xience DES 2.75 x 15 (3.0 mm post-dilation).   EF ~60%  Echo 11/2016: EF 60-65%, Gr 1 DD..  Mild RV dilation.  LEFT HEART CATH AND CORONARY ANGIOGRAPHY: 04/08/2017      Ost RCA to Prox RCA lesion, 100 %stenosed.  Ost Cx to Prox Cx lesion, 80 %stenosed.  Mid LAD lesion, 0 %stenosed.  There is mild left ventricular systolic dysfunction.  LV end diastolic pressure is mildly elevated.  The left ventricular ejection fraction is 50-55% by visual estimate  -- Ostial circumflex be a difficult PCI target. The entire RCA is extensively occluded until collateralizations fill the PDA.  -- Not thought to be a good PCI candidate. CT surgical consult also.      Patient Profile     66 y.o. male with medical history significant of CAD s/p stenting in 2010, DM, PE quit taking eloquis due to cost issues, GERD, OSA, HTN comes in with 2 days of epigastric /sscp that does not radiate but associated with a lot of nausea and vomiting.  He has been feeling awful since Saturday 10/19 AM.  He thought he was coming down with the flu.  The pain is "pressure" in his chest and has become more persistent and is worse when he is vomiting.  No fevers.  No cough.  His glucose has been more elevated more than usual also with glucose over 300 and he hasnt been able to eat either.  Pt found to have elevated troponin level and referred for admission for NSTEMI.   Assessment & Plan    Principal Problem:   NSTEMI (non-ST elevated myocardial infarction) (HCC) Active Problems:   CAD S/P DES PCI to mLAD: Xience DES 2.75 x 15 (3.0 mm)   Diabetes mellitus type 2, uncontrolled (HCC)   Essential hypertension   Hyperlipidemia due to type 2 diabetes mellitus (HCC)   DM (diabetes mellitus), type 2 (HCC)   NAUSEA AND VOMITING   Pulmonary embolism (HCC)   Gastroparesis  Admitting Sx are  more c/w possible viral illness, but now with CP & NSTEMI by Troponin, I think that the stress of his illness may have triggered ACS.  Plan:   NSTEMI - continue IV Heparin, ASA & statin.  started low dose Beta Blocker (Metoprolol 12.5 mg BID) & continue amlodipine; Increase statin to 80mg .  Cardiac  cath on 10/22 showed Significant CAD - RCA CTO with severe Ostial Cx - best option is CABG.   Seen by CT Sgx - NOT SURGICAL CANDIDATE I reviewed the films with Drs. Pamala DuffelBerry, Cooper, McAlhany & Ganji -- consensus is that Ostial CX PCI is also not favorable -- would difficult & high risk as a "last ditch effort" for intractable angina.   Plan for now is [facility].  Tolerated increased Metoprolol up to 25 mg PO BID  Ranex added yesterday (CM consulted for med assistance).  Low dose Lasix added for HFPEF    Completed 48 hr IV Heparin for NSTESMI.  Loaded with Plavox - daily dose ordered for today -- continue on d/c     IV NTG stopped - decided to use Ranexa over Imdur.    Had been on ACE-I in the past - reassess BP in AM after increased BB - consider as OP.   HLD - increased statin dose.  [facility] & [facility]   Based upon our discussions -he is happy re: plan for no CABG (does not want to take the risk) or PCI at this time.  Has clearly indicated DNR/DNI.  From a Cardiology standpoint - NO active angina or CHF Sx - feels well.  OK for d/c - will arrange OP f/u in [place] [facility]. (me or Dr. Allyson SabalBerry)    For questions or updates, please contact [facility] Please consult [email or web address] for contact info under Cardiology/STEMI.      Signed, Bryan Lemmaavid Harding, MD  04/11/2017, 1:38 PM

## 2017-04-11 NOTE — Evaluation (Signed)
Occupational Therapy Evaluation and Discharge Patient Details Name: Beckie BusingJohn D Yost MRN: [id number] DOB: 05-11-1951 Today's Date: 04/11/2017    History of Present Illness 66 y.o. male with medical history significant of CAD s/p stenting in 2010, DM, PE quit taking eloquis due to cost issues, GERD, OSA, HTN comes in with 2 days of epigastric /sscp that does not radiate but associated with a lot of nausea and vomiting.  He has been feeling awful since Saturday 10/19 AM.  He thought he was coming down with the flu.  The pain is "pressure" in his chest and has become more persistent and is worse when he is vomiting.  No fevers.  No cough.  His glucose has been more elevated more than usual also with glucose over 300 and he hasnt been able to eat either.  Pt found to have elevated troponin level and referred for admission for NSTEMI.  Needs CABG or PCI but does not qualify due to risks.    Clinical Impression   This 66 yo male admitted with above presents to acute OT at a Mod I level from a W/C standpoint, but has great potential to get to an ambulatory standpoint with prothesis with follow up HHPT. No further OT needs, we will sign off.     Follow Up Recommendations  No OT follow up;Supervision - Intermittent;Other (comment) (do recommend HHPT to work with pt more with prothesis)    Equipment Recommendations  None recommended by OT       Precautions / Restrictions Precautions Precautions: Fall Precaution Comments: has left LE prosthesis but does not wear it per pt--insurance quit paying for HHPT and pt has not been able to use prothesis since Restrictions Weight Bearing Restrictions: No      Mobility Bed Mobility Overal bed mobility: Independent                Transfers Overall transfer level: Modified independent Equipment used: None Transfers: Squat Pivot Transfers     Squat pivot transfers: Modified independent (Device/Increase time)     General transfer comment: places  W/C in front of him, reaches for both arms of W/C and then stands and pivots 180 degrees    Balance Overall balance assessment: Needs assistance Sitting-balance support: Feet supported;No upper extremity supported Sitting balance-Leahy Scale: Good                                     ADL either performed or assessed with clinical judgement   ADL Overall ADL's : At baseline                                        Pt with SOB with transfers. Educated pt on purse lipped breathing and he returned demonstrated.      Vision Patient Visual Report: No change from baseline              Pertinent Vitals/Pain Pain Assessment: No/denies pain     Hand Dominance Right   Extremity/Trunk Assessment Upper Extremity Assessment Upper Extremity Assessment: Overall WFL for tasks assessed           Communication Communication Communication: No difficulties   Cognition Arousal/Alertness: Awake/alert Behavior During Therapy: WFL for tasks assessed/performed Overall Cognitive Status: Within Functional Limits for tasks assessed  Home Living Family/patient expects to be discharged to:: Private residence Living Arrangements: Alone Available Help at Discharge: Family;Available PRN/intermittently (family/friends live close by and someone is almost always at home with him per his report) Type of Home: Apartment Home Access: Level entry     Home Layout: One level     Bathroom Shower/Tub: Tub/shower unit;Curtain   Bathroom Toilet: Handicapped height     Home Equipment: Tub bench;Grab bars - tub/shower;Grab bars - toilet;Hand held shower head;Walker - 4 wheels;Walker - 2 wheels;Wheelchair - Engineer, technical sales - power;Other (comment);Bedside commode (lift chair)   Additional Comments: prosthetic for L LE      Prior Functioning/Environment Level of Independence: Independent with assistive  device(s)        Comments: can transfer unaided and use wheelchair in the home, needs PT for walking with prosthetic and walker - pt reports he hasn't walked in last 6 weeks due to decr endurance and PT had been stopped due to insurance would not pay anymore                 OT Goals(Current goals can be found in the care plan section) Acute Rehab OT Goals Patient Stated Goal: to go home and get more PT so I can use my prothesis and walk  OT Frequency:                AM-PAC PT "6 Clicks" Daily Activity     Outcome Measure Help from another person eating meals?: None Help from another person taking care of personal grooming?: None Help from another person toileting, which includes using toliet, bedpan, or urinal?: None Help from another person bathing (including washing, rinsing, drying)?: None Help from another person to put on and taking off regular upper body clothing?: None Help from another person to put on and taking off regular lower body clothing?: None 6 Click Score: 24   End of Session    Activity Tolerance: Patient tolerated treatment well Patient left: in bed;with call bell/phone within reach                   Time: 1346-1400 OT Time Calculation (min): 14 min Charges:  OT General Charges $OT Visit: 1 Visit OT Evaluation $OT Eval Moderate Complexity: [address] Ignacia Palma, [place] [phone] 04/11/2017

## 2017-04-11 NOTE — Progress Notes (Signed)
[facility]. Pt has used [name] in the past and wants to use them again. Mary with [facility] notified and accepted the referral.  Pt discharging on Ranexa and Eliquis and each have a $40 co pay after insurance. Pt states he is not able to afford this. [facility] and they are going to pay for his medications this month. They also faxed CM the Patient assistance application for the Ranexa. CM filled out the patient portion and [facility]ed to [facility]. [facility] and they will fill out the MD portion and [facility]. Patient to f/u with Dr Reuel DerbyKims office that is doing the assistance forms for his Eliquis.  [facility] called patients pharmacy [facility] and [facility] his [facility] tonight.  CM called [facility] per pt request and arranged transport home. Transport form on front of chart and bedside RN updated.

## 2017-04-11 NOTE — Consult Note (Signed)
   [facility]   04/11/2017  Beckie BusingJohn D Crutcher [birth year] [id number]  Calls received by inpatient RNCM regarding new medications patient needs for home.  Patient unable to afford medications, MD aware. Patient being referred for medications, financial assistance/budgeting management, issues with co-pays. [facility] with [facility] Pharmacist and inpatient RNCM. Working on getting medications prior to discharge today. Informed that the [facility] and needs medications picked up for him.    Charlesetta ShanksVictoria Danea Manter, RN BSN CCM [facility] Liaison  [phone] business mobile phone Toll free office [phone]

## 2017-04-11 NOTE — Discharge Summary (Signed)
Physician Discharge Summary  Samuel [name] UJW:[id number] DOB: [birth year] DOA: 04/07/2017  PCP: [name], MD  Admit date: 04/07/2017 Discharge date: 04/11/2017  Admitted From: Home Disposition:  Home  Recommendations for Outpatient Follow-up:  1. Follow up with PCP in 1 week with repeat BMP 2. Follow-up with cardiology/Dr. Herbie Baltimore in 1-2 weeks 3. Follow-up with the ED if symptoms worsen or new appear   Home Health: yes  Equipment/Devices: none  Discharge Condition: guarded  CODE STATUS: DO NOT RESUSCITATE  Diet recommendation: Heart Healthy / Carb Modified   Brief/Interim Summary: 66 year old male with history of coronary artery disease status post stenting in 2010, diabetes mellitus, PE quit taking eliquis due to cost issues, GERD, OSA, hypertension presented with epigastric/chest pain. He was found to have elevated troponin and was admitted for non-STEMI. Cardiology was consulted. Patient had cardiac catheterization on 04/08/2017 showing significant disease. Cardiothoracic surgery has evaluated the patient and patient is not surgical candidate because of multiple medical comorbidities. Patient will be managed medically for now and cardiology has cleared the patient for discharge.  Discharge Diagnoses:  Principal Problem:   NSTEMI (non-ST elevated myocardial infarction) (HCC) Active Problems:   NAUSEA AND VOMITING   Pulmonary embolism (HCC)   CAD S/P DES PCI to mLAD: Xience DES 2.75 x 15 (3.0 mm)   Essential hypertension   Hyperlipidemia due to type 2 diabetes mellitus (HCC)   Gastroparesis   DM (diabetes mellitus), type 2 (HCC)   Diabetes mellitus type 2, uncontrolled (HCC)  NSTEMI:  - Cardiac catheterization on 04/08/17: Significant disease - Cardiothoracic surgery has evaluated the patient and patient is not surgical candidate because of multiple medical comorbidities. - patient will be managed medically for now. Currently chest pain-free. Cardiology has cleared the  patient for discharge. Continue aspirin, statin, metoprolol, Plavix and Ranexa. Lasix has been added by cardiology which will be  Continued. Quinapril can be resumed as an outpatient if blood pressures is still elevated. - Currently chest pain free   N/V: - likely from gastroparesis and general ill feeling from occlusive CAD. Chronic issue for pt that flares from time to time.  - outpatient follow-up. Continue Reglan and Zofran  DM: - continue Levemir and other home oral medications. Outpatient follow-up  HTN:  - Monitor blood pressure. Continue metoprolol, Lasix and amlodipine. 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 Patient might benefit from outpatient physical therapy evaluation and follow-up at home  Hypokalemia - improved.  History of pulmonary embolism currently not on eliquis - resume eliquis. Outpatient followup with primary care provider   Discharge Instructions  Discharge Instructions    (HEART FAILURE PATIENTS) Call MD:  Anytime you have any of the following symptoms: 1) 3 pound weight gain in 24 hours or 5 pounds in 1 week 2) shortness of breath, with or without a dry hacking cough 3) swelling in the hands, feet or stomach 4) if you have to sleep on extra pillows at night in order to breathe.    Complete by:  As directed    AMB Referral to [facility] Care Management    Complete by:  As directed    Reason for consult:  Patient needs medication assistance   Expected date of contact:  1-3 days (reserved for hospital discharges)   Please assign this patient to pharmacy for medication needs, cannot afford medications, has not taken Eliquis due to cost, NSTEMI,   Please assign patient  to social worker to assess for programs in his area, cannot afford co-pays for PT/OT had [name], owes them money.  Please assign to RN to community for transition of care, patient is high  risk, HTA.  Thanks for questions, please contact:  [name], RN BSN CCM [facility] [place]  [phone] business mobile phone Toll free office [phone]   Ambulatory referral to Cardiology    Complete by:  As directed    Follow up in 1-2 weeks   Call MD for:  difficulty breathing, headache or visual disturbances    Complete by:  As directed    Call MD for:  extreme fatigue    Complete by:  As directed    Call MD for:  hives    Complete by:  As directed    Call MD for:  persistant dizziness or light-headedness    Complete by:  As directed    Call MD for:  persistant nausea and vomiting    Complete by:  As directed    Call MD for:  severe uncontrolled pain    Complete by:  As directed    Call MD for:  temperature >100.4    Complete by:  As directed    Diet - low sodium heart healthy    Complete by:  As directed    Diet Carb Modified    Complete by:  As directed    Increase activity slowly    Complete by:  As directed      Allergies as of 04/11/2017      Reactions   Gabapentin Hives, Itching, Rash   REACTION: rash/hives (per patient, was a reaction to an inactive ingredient in another mgf brand). The patient stated that he does take this medication now and it doesn't cause a rash any more.   Nabumetone Itching, Nausea Only, Rash   Codeine Other (See Comments)   GI UPSET      Medication List    STOP taking these medications   quinapril 5 MG tablet Commonly known as:  ACCUPRIL   simvastatin 40 MG tablet Commonly known as:  ZOCOR     TAKE these medications   amLODipine 10 MG tablet Commonly known as:  NORVASC Take 1 tablet (10 mg total) by mouth daily.   apixaban 5 MG Tabs tablet Commonly known as:  ELIQUIS Take 1 tablet (5 mg total) by mouth 2 (two) times daily. What changed:  Another medication with the same name was removed. Continue taking this medication, and follow the directions you see here.   aspirin 81 MG EC tablet Take 1  tablet (81 mg total) by mouth daily.   atorvastatin 80 MG tablet Commonly known as:  LIPITOR Take 1 tablet (80 mg total) by mouth daily at 6 PM.   clopidogrel 75 MG tablet Commonly known as:  PLAVIX Take 1 tablet (75 mg total) by mouth daily.   COLACE 100 MG capsule Generic drug:  docusate sodium Take 100 mg by mouth daily as needed (for constipation).   diphenhydramine-acetaminophen 25-500 MG Tabs tablet Commonly known as:  TYLENOL PM Take 1 tablet by mouth at bedtime.   ferrous sulfate 325 (65 FE) MG tablet Take 325 mg by mouth every evening.   furosemide 20 MG tablet Commonly known as:  LASIX Take 1 tablet (20 mg total) by mouth daily.   gabapentin 300 MG capsule Commonly known as:  NEURONTIN Take 300 mg by mouth 3 (three) times daily.   insulin detemir  100 UNIT/ML injection Commonly known as:  LEVEMIR Inject 85 Units into the skin 2 (two) times daily.   insulin lispro 100 UNIT/ML injection Commonly known as:  HUMALOG Inject 0-10 Units into the skin 3 (three) times daily with meals. Inject SQ per sliding scale 150-250 (5 units), 251-350(8 units), 351-450(10 units), >450=10 units   INVOKANA 100 MG Tabs tablet Generic drug:  canagliflozin Take 100 mg by mouth See admin instructions. invokana 100 mg daily. Alternating every other week between Jardiance and Invokana   JARDIANCE 10 MG Tabs tablet Generic drug:  empagliflozin Take 10 mg by mouth See admin instructions. Jardiance 10 mg daily.  Alternating every other week between Jardiance and Invokana   lansoprazole 30 MG capsule Commonly known as:  PREVACID Take 30 mg by mouth 2 (two) times daily.   metoCLOPramide 10 MG tablet Commonly known as:  REGLAN Take 10 mg by mouth at bedtime.   metoprolol tartrate 25 MG tablet Commonly known as:  LOPRESSOR Take 1 tablet (25 mg total) by mouth 2 (two) times daily.   nitroGLYCERIN 0.4 MG SL tablet Commonly known as:  NITROSTAT Place 1 tablet (0.4 mg total) under the  tongue every 5 (five) minutes as needed for chest pain.   pioglitazone 30 MG tablet Commonly known as:  ACTOS Take 30 mg by mouth at bedtime.   polyethylene glycol packet Commonly known as:  MIRALAX / GLYCOLAX Take 17 g by mouth daily as needed for moderate constipation.   prochlorperazine 10 MG tablet Commonly known as:  COMPAZINE Take 10 mg by mouth daily as needed for nausea/vomiting.   promethazine 25 MG tablet Commonly known as:  PHENERGAN Take 25 mg by mouth every 8 (eight) hours as needed for nausea or vomiting. Take one tablet by mouth every 8 hours as needed nausea/vomiting What changed:  Another medication with the same name was removed. Continue taking this medication, and follow the directions you see here.   ranitidine 150 MG tablet Commonly known as:  ZANTAC Take 150 mg by mouth at bedtime.   ranolazine 500 MG 12 hr tablet Commonly known as:  RANEXA Take 1 tablet (500 mg total) by mouth 2 (two) times daily.   traMADol 50 MG tablet Commonly known as:  ULTRAM Take 100 mg by mouth every 12 (twelve) hours as needed (pain).       Follow-up Information    Pearson GrippeKim, James, MD. Schedule an appointment as soon as possible for a visit in 1 week(s).   Specialty:  Internal Medicine Why:  with repeat BMP Contact information: [address] [place] [address] [phone]        Marykay LexHarding, David W, MD. Schedule an appointment as soon as possible for a visit.   Specialty:  Cardiology Why:  follow up in 1-2 weeks Contact information: [name] [place] [address] [phone]          Allergies  Allergen Reactions  . Gabapentin Hives, Itching and Rash    REACTION: rash/hives (per patient, was a reaction to an inactive ingredient in another mgf brand). The patient stated that he does take this medication now and it doesn't cause a rash any more.  . Nabumetone Itching, Nausea Only and Rash  . Codeine Other (See Comments)    GI  UPSET    Consultations:  cardiology   Procedures/Studies: Ct Angio Chest Pe W/cm &/or Wo Cm  Result Date: 04/07/2017 CLINICAL DATA:  66 year old male with acute shortness of breath and  chest pain. EXAM: CT ANGIOGRAPHY CHEST WITH CONTRAST TECHNIQUE: Multidetector CT imaging of the chest was performed using the standard protocol during bolus administration of intravenous contrast. Multiplanar CT image reconstructions and MIPs were obtained to evaluate the vascular anatomy. CONTRAST:  100 cc intravenous Isovue 370 COMPARISON:  11/18/2016 and prior chest CTs. 04/07/2017 chest radiograph FINDINGS: Cardiovascular: This is a technically adequate study although motion artifact in the lower lungs decreases sensitivity. No pulmonary emboli are identified. Upper limits normal heart size noted with heavy coronary artery calcifications. Thoracic aortic atherosclerotic calcifications noted without aneurysm. No pericardial effusion. Mediastinum/Nodes: No enlarged mediastinal, hilar, or axillary lymph nodes. Thyroid gland, trachea, and esophagus demonstrate no significant findings. Lungs/Pleura: Mild subsegmental atelectasis/ scarring within the mid-lower right lung and left lung base again noted. No airspace disease, consolidation, nodule, mass, pleural effusion or pneumothorax noted. Upper Abdomen: No acute abnormality Musculoskeletal: No chest wall abnormality. No acute or significant osseous findings. Review of the MIP images confirms the above findings. IMPRESSION: 1. No evidence of acute abnormality. No evidence of pulmonary emboli. 2. Upper limits normal heart size and coronary artery disease 3. Mild subsegmental atelectasis/scarring within the mid and lower lungs. 4.  Aortic Atherosclerosis (ICD10-I70.0). Electronically Signed   By: Harmon Pier M.D.   On: 04/07/2017 15:23   Dg Chest Port 1 View  Result Date: 04/07/2017 CLINICAL DATA:  Chest pain. EXAM: PORTABLE CHEST 1 VIEW COMPARISON:  February 19, 2017  FINDINGS: The heart size and mediastinal contours are within normal limits. Both lungs are clear. The visualized skeletal structures are unremarkable. IMPRESSION: No active disease. Electronically Signed   By: Gerome Sam III M.D   On: 04/07/2017 13:50    Carter catheterization on 04/08/2017  Ost RCA to Prox RCA lesion, 100 %stenosed.  Ost Cx to Prox Cx lesion, 80 %stenosed.  Mid LAD lesion, 0 %stenosed.  There is mild left ventricular systolic dysfunction.  LV end diastolic pressure is mildly elevated.  The left ventricular ejection fraction is 50-55% by visual estimate.   Subjective: Patient seen and examined at bedside. He denies any current chest pain, nausea or vomiting. He wants to go home  Discharge Exam: Vitals:   04/11/17 0835 04/11/17 1212  BP: 130/75 (!) 132/92  Pulse: 83 77  Resp: 17 18  Temp: 98.2 F (36.8 C) (!) 97.5 F (36.4 C)  SpO2: 99% 93%   Vitals:   04/11/17 0005 04/11/17 0441 04/11/17 0835 04/11/17 1212  BP: 136/72 136/71 130/75 (!) 132/92  Pulse:   83 77  Resp:   17 18  Temp: 98 F (36.7 C) 98.3 F (36.8 C) 98.2 F (36.8 C) (!) 97.5 F (36.4 C)  TempSrc: Oral Oral Oral Oral  SpO2:   99% 93%  Weight:      Height:        General: Pt is alert, awake, not in acute distress Cardiovascular: rate controlled, S1/S2 + Respiratory: bilateral decreased breath sounds at bases Abdominal: Soft, NT, ND, bowel sounds + Extremities: no edema, no cyanosis; Left BKA    The results of significant diagnostics from this hospitalization (including imaging, microbiology, ancillary and laboratory) are listed below for reference.     Microbiology: No results found for this or any previous visit (from the past 240 hour(s)).   Labs: BNP (last 3 results)  Recent Labs  11/19/16 0513  BNP 62.1   Basic Metabolic Panel:  Recent Labs Lab 04/07/17 1204 04/08/17 0707 04/09/17 0349 04/10/17 0346 04/11/17 0327  NA 137 134*  138 137 139  K 4.1 3.1*  3.4* 3.4* 3.8  CL 99* 105 107 102 105  CO2 17* 17* 23 25 25   GLUCOSE 291* 136* 121* 137* 97  BUN 19 23* 20 14 11   CREATININE 1.37* 1.38* 0.98 0.91 0.97  CALCIUM 9.6 8.8* 8.6* 8.9 9.2  MG  --   --   --  1.9 1.7   Liver Function Tests:  Recent Labs Lab 04/07/17 1204  AST 21  ALT 20  ALKPHOS 63  BILITOT 1.5*  PROT 8.1  ALBUMIN 3.9    Recent Labs Lab 04/07/17 1204  LIPASE 17   No results for input(s): AMMONIA in the last 168 hours. CBC:  Recent Labs Lab 04/07/17 1204 04/08/17 0707 04/09/17 0349 04/10/17 0346 04/11/17 0327  WBC 12.4* 11.6* 8.3 7.5 6.1  NEUTROABS 11.2*  --   --   --   --   HGB 14.9 13.1 12.4* 13.2 13.7  HCT 44.2 38.6* 37.1* 39.8 40.5  MCV 90.2 89.8 90.5 90.5 90.2  PLT 241 227 195 219 213   Cardiac Enzymes:  Recent Labs Lab 04/07/17 1204 04/07/17 1838 04/08/17 0018 04/08/17 0707  TROPONINI 0.32* 0.75* 2.64* 5.40*   BNP: Invalid input(s): POCBNP CBG:  Recent Labs Lab 04/10/17 0402 04/10/17 0804 04/10/17 1117 04/10/17 2021 04/11/17 0631  GLUCAP 128* 148* 161* 183* 73   D-Dimer No results for input(s): DDIMER in the last 72 hours. Hgb A1c No results for input(s): HGBA1C in the last 72 hours. Lipid Profile No results for input(s): CHOL, HDL, LDLCALC, TRIG, CHOLHDL, LDLDIRECT in the last 72 hours. Thyroid function studies No results for input(s): TSH, T4TOTAL, T3FREE, THYROIDAB in the last 72 hours.  Invalid input(s): FREET3 Anemia work up No results for input(s): VITAMINB12, FOLATE, FERRITIN, TIBC, IRON, RETICCTPCT in the last 72 hours. Urinalysis    Component Value Date/Time   COLORURINE YELLOW 04/07/2017 1204   APPEARANCEUR CLEAR 04/07/2017 1204   LABSPEC 1.027 04/07/2017 1204   PHURINE 5.0 04/07/2017 1204   GLUCOSEU >=500 (A) 04/07/2017 1204   HGBUR SMALL (A) 04/07/2017 1204   BILIRUBINUR NEGATIVE 04/07/2017 1204   KETONESUR 80 (A) 04/07/2017 1204   PROTEINUR 30 (A) 04/07/2017 1204   UROBILINOGEN 1.0 04/05/2015 1155    NITRITE NEGATIVE 04/07/2017 1204   LEUKOCYTESUR NEGATIVE 04/07/2017 1204   Sepsis Labs Invalid input(s): PROCALCITONIN,  WBC,  LACTICIDVEN Microbiology No results found for this or any previous visit (from the past 240 hour(s)).   Time coordinating discharge: 35 minutes  SIGNED:   Glade Lloyd, MD  [facility] 04/11/2017, 2:02 PM Pager: [phone]  If 7PM-7AM, please contact night-coverage [email or web address] Password [email or web address]

## 2017-04-11 NOTE — Progress Notes (Addendum)
Benefits check for Ranexa revealed:   # 2.  S/W  JUDY @ [employer] # [id number] OPT- 2    RANEXA  500 MG BID   COVER- YES  CO-PAY- $ 40.00  TIER- 3 DRUG  PRIOR APPROVAL- NO   DEDUCTIBLE : NO   PREFERRED PHARMACY : [facility]   CM notified [place] with [facility] to see if they can assist with the cost of the Ranexa. CM following for needs.

## 2017-04-11 NOTE — Consult Note (Signed)
   [facility] CM Inpatient Consult   04/11/2017  Abb D Byrns 10/22/1950 [id number]  Received a call from inpatient RNCM, Kelli regarding patient's new medication needs.  Kelli states she thought the patient may have changes to his medications with Eliquis and Plavix/ASA.  Patient will also have Ranexa and his co-pay would be $40.00.  She wanted to see if [facility] could assist with the new medications as well, as cost seems to be a major issue for this patient.  This writer did call [facility] Pharmacist [name] Boyd and left a voicemail with the changes verbalized to this writer from inpatient RNCM.  Patient may still be on Eliquis per Kellie from a return call.  Will continue to follow.  Met with the patient who was asleep but easily aroused.  Explained the information needed for his application for assistance as set forth by the [facility] Pharmacist.  Gave patient written information and placed in his [facility] new patient folder and explained to the patient that written information is in his folder and he verbalized understanding and appreciation. For questions, please contact:   , RN BSN CCM [facility] Liaison  [phone] business mobile phone Toll free office [phone]    

## 2017-04-12 ENCOUNTER — Other Ambulatory Visit: Payer: Self-pay

## 2017-04-12 ENCOUNTER — Telehealth: Payer: Self-pay | Admitting: Cardiology

## 2017-04-12 ENCOUNTER — Other Ambulatory Visit: Payer: Self-pay | Admitting: *Deleted

## 2017-04-12 NOTE — Telephone Encounter (Signed)
nEW MESSAGE     Pt c/o medication issue:  1. Name of Medication: 81MG  BABY ASPIRIN  2. How are you currently taking this medication (dosage and times per day)? NOT TAKING  3. Are you having a reaction (difficulty breathing--STAT)? NO  4. What is your medication issue?  NEED CLARIFICATION IS HE TO TAKE IT OR NOT TO TAKE IT ?

## 2017-04-12 NOTE — Telephone Encounter (Signed)
  Patient contacted regarding discharge from 04/07/2017 - 04/11/2017 (4 days); [facility]  Patient understands to follow up with provider Franky MachoLuke on 04-19-2017 at [facility]30[facility]. Patient understands discharge instructions? YES Patient understands medications and regiment? YES Patient understands to bring all medications to this visit? YES  Pt will arrive early for check-in purposes. He will call back with any new sx he may experience before then

## 2017-04-12 NOTE — Patient Outreach (Signed)
[facility]) Care Management  04/12/17  Samuel [name] June 09, 1951 [id number]  RNCM received referral from hospital liaison on 04/09/2017 for transition of care outreach. Patient was discharged home on 04/11/2017. [facility] with 2 days of epigastric pain with significant nausea and vomiting. He was diagnosed with a MI and had cath done on 04/08/17 showed a patent LAD, high grade proximal CFX and RCA. He had L-R collaterals with preserved LVF. He was seen in consult by Dr Tyrone SageGerhardt for consideration of CABG. After evaluation of the patient and review of his labs and PFTs it was felt that the surgery was feasible it was felt that the pt's chances of recovery were poor. The pt himself did not want surgery under these conditions. He was discharged 04/11/17.  Patient has a past medical history of CAD s/p stenting in 2010, IDDM, PE in 2012 (quit taking Eliquis due to cost issues), s/p L BKA, GERD, OSA, HTN.   Successful outreach completed with patient. Patient identification verified.  Patient stated that he is home and has been doing well since discharge. He stated that he had 6 new prescriptions and they were all filled. He expressed gratitude for the assistance provided that allowed him to get all of his prescriptions. He reported that he is taking all of his medications as prescribed.  Patient currently denies any concerns. Patient denies any chest pain or shortness of breath since discharge. He does state that he has issues with gastroparesis that is worse in the mornings and often has nausea and vomiting. He stated he prefers home visits and calls later in the day due to this.  Home visit scheduled for next week.  [place]ia R. Jonne Rote, RN, BSN, CCM [facility] Care [facility] Coordinator [phone]

## 2017-04-12 NOTE — Patient Outreach (Signed)
[facility]) Care Management  04/12/2017  Samuel BusingJohn D [name] 1950-06-21 [id number]   CSW made an initial attempt to try and contact patient today to perform phone assessment, as well as assess and assist with social needs and services, without success.  A HIPAA compliant message was left for patient on voicemail.  CSW is currently awaiting a return call. CSW will make a second outreach attempt within the next week, if CSW does not receive a return call from patient in the meantime. Samuel BadJoanna [name], BSW, MSW, LCSW  Licensed Restaurant manager, fast foodClinical Social Worker  [facility] Care Management [facility]  [address]ailing [address], [place], [place] [address] Physical Address-[address], [place], [place] [address] Toll Free Main # [phone] Fax # [phone] Cell # [phone]  Office # [phone] [name]

## 2017-04-15 NOTE — Patient Outreach (Signed)
Late Entry for 04/11/17   [facility]) Care Management  04/15/2017  Samuel [name] Feb 12, 1951 [id number]   Received a call from [facility] Liaison Nurse, Samuel ShanksVictoria [name] stating the patient would be discharged and could not afford the copays for his medications.  A patient assistance application had already been started for the patient for Eliquis but it was not completed.  The necessary information was requested from the patient and his provider.  In addition, the patient assistance application for Ranexa was emailed to the discharge nurse at the hospital on the patient's behalf.  However, none of the patient assistance programs provide medications immediately.  As such, [facility] funding was used to purchase the patient's medications from his local pharmacy [facility]  The total for the patient's medications was $81.28.  Plan:  Follow up with the patient in 3-5 business days to complete the patient assistance applications.   Samuel McardleKatina J. Elsi [name], PharmD, BCACP [facility] Clinical Pharmacist [phone]

## 2017-04-16 ENCOUNTER — Other Ambulatory Visit: Payer: Self-pay | Admitting: *Deleted

## 2017-04-16 DIAGNOSIS — E1142 Type 2 diabetes mellitus with diabetic polyneuropathy: Secondary | ICD-10-CM | POA: Diagnosis not present

## 2017-04-16 DIAGNOSIS — I252 Old myocardial infarction: Secondary | ICD-10-CM | POA: Diagnosis not present

## 2017-04-16 DIAGNOSIS — E11621 Type 2 diabetes mellitus with foot ulcer: Secondary | ICD-10-CM | POA: Diagnosis not present

## 2017-04-16 DIAGNOSIS — E1165 Type 2 diabetes mellitus with hyperglycemia: Secondary | ICD-10-CM | POA: Diagnosis not present

## 2017-04-16 DIAGNOSIS — J449 Chronic obstructive pulmonary disease, unspecified: Secondary | ICD-10-CM | POA: Diagnosis not present

## 2017-04-16 DIAGNOSIS — L97411 Non-pressure chronic ulcer of right heel and midfoot limited to breakdown of skin: Secondary | ICD-10-CM | POA: Diagnosis not present

## 2017-04-16 DIAGNOSIS — E1169 Type 2 diabetes mellitus with other specified complication: Secondary | ICD-10-CM | POA: Diagnosis not present

## 2017-04-16 DIAGNOSIS — F329 Major depressive disorder, single episode, unspecified: Secondary | ICD-10-CM | POA: Diagnosis not present

## 2017-04-16 DIAGNOSIS — I214 Non-ST elevation (NSTEMI) myocardial infarction: Secondary | ICD-10-CM | POA: Diagnosis not present

## 2017-04-16 DIAGNOSIS — I2510 Atherosclerotic heart disease of native coronary artery without angina pectoris: Secondary | ICD-10-CM | POA: Diagnosis not present

## 2017-04-16 DIAGNOSIS — I10 Essential (primary) hypertension: Secondary | ICD-10-CM | POA: Diagnosis not present

## 2017-04-16 DIAGNOSIS — E1151 Type 2 diabetes mellitus with diabetic peripheral angiopathy without gangrene: Secondary | ICD-10-CM | POA: Diagnosis not present

## 2017-04-16 DIAGNOSIS — E1143 Type 2 diabetes mellitus with diabetic autonomic (poly)neuropathy: Secondary | ICD-10-CM | POA: Diagnosis not present

## 2017-04-16 DIAGNOSIS — K3184 Gastroparesis: Secondary | ICD-10-CM | POA: Diagnosis not present

## 2017-04-16 NOTE — Patient Outreach (Signed)
[facility]) Care Management  04/16/2017  Beckie BusingJohn D Coad 30-Mar-1951 [id number]   CSW made a second attempt to try and contact patient today to perform phone assessment, as well as assess and assist with social needs and services, without success.  A HIPAA compliant message was left for patient on voicemail.  CSW is currently awaiting a return call. CSW will make a third and final outreach attempt within the next week, if CSW does not receive a return call from patient in the meantime. Danford BadJoanna Saporito, BSW, MSW, LCSW  Licensed Restaurant manager, fast foodClinical Social Worker  [facility]  [address]ailing [place] [address], [place], [place] [address] Physical Address-[address], [place], [place] [address] Toll Free Main # [phone] Fax # [phone] Cell # [phone]  Office # [phone] [email or web address]

## 2017-04-18 ENCOUNTER — Other Ambulatory Visit: Payer: Self-pay

## 2017-04-18 NOTE — Patient Outreach (Signed)
[name]) Care Management  [facility] Care Manager  04/18/2017   Samuel [name] Jan 22, 1951 [id number]  Home visit completed with patient.   Subjective: Patient reported that he has been doing pretty well.  Objective: Blood pressure 122/64, pulse 68, height 1.905 m (6\' 3" ), weight 273 lb (123.8 kg), SpO2 98 %.  Encounter Medications:  Outpatient Encounter Prescriptions as of 04/18/2017  Medication Sig Note  . amLODipine (NORVASC) 10 MG tablet Take 1 tablet (10 mg total) by mouth daily.   [name] apixaban (ELIQUIS) 5 MG TABS tablet Take 1 tablet (5 mg total) by mouth 2 (two) times daily.   [name] aspirin 81 MG EC tablet Take 1 tablet (81 mg total) by mouth daily.   [name] atorvastatin (LIPITOR) 80 MG tablet Take 1 tablet (80 mg total) by mouth daily at 6 PM.   . Canagliflozin (INVOKANA) 100 MG TABS Take 100 mg by mouth See admin instructions. invokana 100 mg daily. Alternating every other week between Jardiance and Invokana 11/19/2016: Pt not sure which day of week he switches to jardiance    . clopidogrel (PLAVIX) 75 MG tablet Take 1 tablet (75 mg total) by mouth daily.   . diphenhydramine-acetaminophen (TYLENOL PM) 25-500 MG TABS tablet Take 1 tablet by mouth at bedtime.   . docusate sodium (COLACE) 100 MG capsule Take 100 mg by mouth daily as needed (for constipation).    [name] empagliflozin (JARDIANCE) 10 MG TABS tablet Take 10 mg by mouth See admin instructions. Jardiance 10 mg daily.  Alternating every other week between Jardiance and Invokana 11/19/2016: *patient receives samples from MD* Alternating every other week between [place] and Invokana    . ferrous sulfate 325 (65 FE) MG tablet Take 325 mg by mouth every evening.    . furosemide (LASIX) 20 MG tablet Take 1 tablet (20 mg total) by mouth daily.   [name] gabapentin (NEURONTIN) 300 MG capsule Take 300 mg by mouth 3 (three) times daily. 10/06/2014: .  . insulin detemir (LEVEMIR) 100 UNIT/ML injection Inject 85 Units into the skin 2 (two) times  daily.  04/18/2017: Patient is now taking 87 units  . insulin lispro (HUMALOG) 100 UNIT/ML injection Inject 0-10 Units into the skin 3 (three) times daily with meals. Inject SQ per sliding scale 150-250 (5 units), 251-350(8 units), 351-450(10 units), >450=10 units   . lansoprazole (PREVACID) 30 MG capsule Take 30 mg by mouth 2 (two) times daily.   . metoCLOPramide (REGLAN) 10 MG tablet Take 10 mg by mouth at bedtime.    . metoprolol tartrate (LOPRESSOR) 25 MG tablet Take 1 tablet (25 mg total) by mouth 2 (two) times daily.   . nitroGLYCERIN (NITROSTAT) 0.4 MG SL tablet Place 1 tablet (0.4 mg total) under the tongue every 5 (five) minutes as needed for chest pain.   . pioglitazone (ACTOS) 30 MG tablet Take 30 mg by mouth at bedtime.    . polyethylene glycol (MIRALAX / GLYCOLAX) packet Take 17 g by mouth daily as needed for moderate constipation.   . prochlorperazine (COMPAZINE) 10 MG tablet Take 10 mg by mouth daily as needed for nausea/vomiting.   . promethazine (PHENERGAN) 25 MG tablet Take 25 mg by mouth every 8 (eight) hours as needed for nausea or vomiting. Take one tablet by mouth every 8 hours as needed nausea/vomiting   . ranitidine (ZANTAC) 150 MG tablet Take 150 mg by mouth at bedtime.    . ranolazine (RANEXA) 500 MG 12 hr tablet Take 1 tablet (500 mg  total) by mouth 2 (two) times daily.   . traMADol (ULTRAM) 50 MG tablet Take 100 mg by mouth every 12 (twelve) hours as needed (pain).    No facility-administered encounter medications on file as of 04/18/2017.     Functional Status:  In your present state of health, do you have any difficulty performing the following activities: 04/07/2017 11/19/2016  Hearing? N N  Vision? N Y  Difficulty concentrating or making decisions? N N  Walking or climbing stairs? N Y  Comment - Lt BKA  Dressing or bathing? N N  Doing errands, shopping? N N  Some recent data might be hidden    Fall/Depression Screening: Fall Risk  05/03/2014 03/12/2013  Falls  in the past year? No Yes  Number falls in past yr: - 2 or more  Risk Factor Category  - High Fall Risk  Risk for fall due to : - Impaired balance/gait;History of fall(s)   PHQ 2/9 Scores 05/03/2014 03/12/2013  PHQ - 2 Score 0 0    Assessment:  Patient has no complaints or concerns during home visit. He denies any signs/symptoms of cardiac symptoms. He denies any chest pain or shortness of breath. Patient was able to verbalize details of his hospitalization including a good understanding of his condition and why he did not undergo surgery. He stated that he understands the risks and accepts those better than the risks of complications and possibility of not being able to get off the ventilator post-operatively. He is very positive and has a good support system. He talks often with his brother (who lives in [place]) and has a cousin and his wife that are his neighbors and they are actively involved and supportive of patient. Patient is able to drive, but his family assists with running errands such as grocery shopping.  Patient does have a DNR and was able to verbalize what that meant for him. He does have his canary colored DNR form in the home on his living room door and verbalizes understanding of need to keep it with him at all times to have his wishes honored. His brother is his healthcare POA and is also aware of patient's wishes.   Patient continues to take his medications as prescribed. His cousin's wife, Sedalia Muta fills his pillbox, but she was not present today for home visit. He stated that she fills it weekly and goes by the instructions they were given when he was discharged home from the hospital. However, he did mention that not all of his medications are in the pillbox. He keeps his PRN medications in a basked in the living room near his chair and stated that he takes his night time medications from that basket as well. Will need to discuss/verify with the caregiver what she is putting in his  pillbox as it could be confusing with some medications in the box and others not. Also, there are multiple small baskets/bags of medications sitting on patient's desk and he was unable to verbalize specifically how she knows what to put in the pillbox other than he "knows what he is supposed to be taking."   Patient is able to prepare meals and lives independently. His cousin's wife is always present when he showers as supervision in case he falls, but he is able to get in and out of the shower alone and bathe himself.   Patient currently has no needs or concerns. He is interested in receiving additional support and education related to his diabetes because his  blood sugars are running high. His blood pressure is well controlled.   Plan: RNCM will continue to follow patient for continued transition of care. A follow up home visit was scheduled for next week and will hopefully review his medications with his family member as she is assisting patient with filling pillbox.   TurkeyVictoria R. Allix Blomquist, RN, BSN, CCM [facility] Care Management Coordinator [phone]

## 2017-04-19 ENCOUNTER — Ambulatory Visit (INDEPENDENT_AMBULATORY_CARE_PROVIDER_SITE_OTHER): Payer: PPO | Admitting: Cardiology

## 2017-04-19 ENCOUNTER — Other Ambulatory Visit: Payer: Self-pay | Admitting: *Deleted

## 2017-04-19 ENCOUNTER — Other Ambulatory Visit: Payer: Self-pay | Admitting: Pharmacist

## 2017-04-19 ENCOUNTER — Encounter: Payer: Self-pay | Admitting: *Deleted

## 2017-04-19 ENCOUNTER — Encounter: Payer: Self-pay | Admitting: Cardiology

## 2017-04-19 VITALS — BP 110/63 | HR 78 | Ht 75.0 in | Wt 273.0 lb

## 2017-04-19 DIAGNOSIS — Z9861 Coronary angioplasty status: Secondary | ICD-10-CM

## 2017-04-19 DIAGNOSIS — I214 Non-ST elevation (NSTEMI) myocardial infarction: Secondary | ICD-10-CM | POA: Diagnosis not present

## 2017-04-19 DIAGNOSIS — Z794 Long term (current) use of insulin: Secondary | ICD-10-CM | POA: Diagnosis not present

## 2017-04-19 DIAGNOSIS — J449 Chronic obstructive pulmonary disease, unspecified: Secondary | ICD-10-CM | POA: Insufficient documentation

## 2017-04-19 DIAGNOSIS — Z89512 Acquired absence of left leg below knee: Secondary | ICD-10-CM

## 2017-04-19 DIAGNOSIS — E1169 Type 2 diabetes mellitus with other specified complication: Secondary | ICD-10-CM | POA: Diagnosis not present

## 2017-04-19 DIAGNOSIS — Z89519 Acquired absence of unspecified leg below knee: Secondary | ICD-10-CM | POA: Insufficient documentation

## 2017-04-19 DIAGNOSIS — I10 Essential (primary) hypertension: Secondary | ICD-10-CM

## 2017-04-19 DIAGNOSIS — I2510 Atherosclerotic heart disease of native coronary artery without angina pectoris: Secondary | ICD-10-CM | POA: Diagnosis not present

## 2017-04-19 NOTE — Assessment & Plan Note (Signed)
Controlled.  

## 2017-04-19 NOTE — Progress Notes (Signed)
04/19/2017 Samuel [name]   Sep 10, 1950  [id number]  Primary Physician [name], MD Primary Cardiologist: Dr Allyson Sabal  HPI:   66 y.o.malewith medical history significant of CAD s/p stenting in 2010,IDDM, PE in 2012-(quit taking eloquis due to cost issues), s/p Lt BKA, GERD, OSA, and HTN, admitted to [facility] 04/08/17 with 2 days of epigastric /sscp that does not radiate but associated with a lot of nausea and vomiting. He ruled in for an MI. Cath done 04/08/17 showed a patent LAD, high grade proximal CFX and RCA. He had L-R collaterals with preserved LVF. He was seen in consult by Dr Tyrone Sage for consideration of CABG. After evaluation of the patient and review of his labs and PFTs it was felt that the surgery was feasible it was felt that the pt's chances of recovery were poor. The pt himself did not want surgery under these conditions. He was discharged 04/11/17 and is seen in the office today for follow up.   Since discharge he has done pretty well, no chest pain or SOB. He feels "sleepy" all the time. He was discharged on Compazine and Phenergan PRN. I suggested that these may be the cause of his fatigue.    Current Outpatient Prescriptions  Medication Sig Dispense Refill  . amLODipine (NORVASC) 10 MG tablet Take 1 tablet (10 mg total) by mouth daily. 30 tablet 2  . apixaban (ELIQUIS) 5 MG TABS tablet Take 1 tablet (5 mg total) by mouth 2 (two) times daily. 60 tablet 0  . aspirin 81 MG EC tablet Take 1 tablet (81 mg total) by mouth daily. 30 tablet 0  . atorvastatin (LIPITOR) 80 MG tablet Take 1 tablet (80 mg total) by mouth daily at 6 PM. 30 tablet 0  . Canagliflozin (INVOKANA) 100 MG TABS Take 100 mg by mouth See admin instructions. invokana 100 mg daily. Alternating every other week between [place]    . clopidogrel (PLAVIX) 75 MG tablet Take 1 tablet (75 mg total) by mouth daily. 30 tablet 0  . diphenhydramine-acetaminophen (TYLENOL PM) 25-500 MG TABS tablet Take 1 tablet by  mouth at bedtime.    . docusate sodium (COLACE) 100 MG capsule Take 100 mg by mouth daily as needed (for constipation).     [name] empagliflozin (JARDIANCE) 10 MG TABS tablet Take 10 mg by mouth See admin instructions. Jardiance 10 mg daily.  Alternating every other week between [place]    . ferrous sulfate 325 (65 FE) MG tablet Take 325 mg by mouth every evening.     . furosemide (LASIX) 20 MG tablet Take 1 tablet (20 mg total) by mouth daily. 30 tablet 0  . gabapentin (NEURONTIN) 300 MG capsule Take 300 mg by mouth 3 (three) times daily.    . insulin detemir (LEVEMIR) 100 UNIT/ML injection Inject 85 Units into the skin 2 (two) times daily.     . insulin lispro (HUMALOG) 100 UNIT/ML injection Inject 0-10 Units into the skin 3 (three) times daily with meals. Inject SQ per sliding scale 150-250 (5 units), 251-350(8 units), 351-450(10 units), >450=10 units    . lansoprazole (PREVACID) 30 MG capsule Take 30 mg by mouth 2 (two) times daily.  12  . metoCLOPramide (REGLAN) 10 MG tablet Take 10 mg by mouth at bedtime.     . metoprolol tartrate (LOPRESSOR) 25 MG tablet Take 1 tablet (25 mg total) by mouth 2 (two) times daily. 60 tablet 0  . nitroGLYCERIN (NITROSTAT) 0.4 MG SL tablet Place  1 tablet (0.4 mg total) under the tongue every 5 (five) minutes as needed for chest pain. 30 tablet 0  . pioglitazone (ACTOS) 30 MG tablet Take 30 mg by mouth at bedtime.     . polyethylene glycol (MIRALAX / GLYCOLAX) packet Take 17 g by mouth daily as needed for moderate constipation.    . prochlorperazine (COMPAZINE) 10 MG tablet Take 10 mg by mouth daily as needed for nausea/vomiting.  0  . promethazine (PHENERGAN) 25 MG tablet Take 25 mg by mouth every 8 (eight) hours as needed for nausea or vomiting. Take one tablet by mouth every 8 hours as needed nausea/vomiting    . ranitidine (ZANTAC) 150 MG tablet Take 150 mg by mouth at bedtime.     . ranolazine (RANEXA) 500 MG 12 hr tablet Take 1 tablet (500 mg total)  by mouth 2 (two) times daily. 60 tablet 0  . traMADol (ULTRAM) 50 MG tablet Take 100 mg by mouth every 12 (twelve) hours as needed (pain).     No current facility-administered medications for this visit.     Allergies  Allergen Reactions  . Gabapentin Hives, Itching and Rash    REACTION: rash/hives (per patient, was a reaction to an inactive ingredient in another mgf brand). The patient stated that he does take this medication now and it doesn't cause a rash any more.  . Nabumetone Itching, Nausea Only and Rash  . Codeine Other (See Comments)    GI UPSET    Past Medical History:  Diagnosis Date  . Anxiety   . Arthritis    "all over"   . CAD (coronary artery disease)   . Charcot's joint    "left foot"  . 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 Depression   . Gastroparesis   . GERD (gastroesophageal reflux disease)   . H/O hiatal hernia   . Hyperlipidemia   . Hypertension   . OSA (obstructive sleep apnea)    "not bad enough for a mask"  . Peripheral neuropathy   . Peripheral vascular disease (HCC)   . PONV (postoperative nausea and vomiting)   . Pulmonary embolism (HCC)    hx. of 2012  . Shortness of breath    exertion  . Type II diabetes mellitus (HCC)     Social History   Social History  . Marital status: Single    Spouse name: N/A  . Number of children: N/A  . Years of education: N/A   Occupational History  . DISABLITY Unemployed   Social History Main Topics  . Smoking status: Never Smoker  . Smokeless tobacco: Never Used  . Alcohol use Yes     Comment: 01/07/2014 'might have a wine cooler a couple times/yr"  . Drug use: No  . Sexual activity: No   Other Topics Concern  . Not on file   Social History Narrative  . No narrative on file     Family History  Problem Relation Age of Onset  . COPD Mother   . Heart disease Mother   . Lung cancer Mother   . Retinal detachment Father   . Alcoholism Brother   . Heart disease Brother    . COPD Brother   . Diabetes Brother   . Hypertension Brother   . Stroke Brother      Review of Systems: General: negative for chills, fever, night sweats or weight changes.  Cardiovascular: negative for chest pain, dyspnea on exertion, edema, orthopnea, palpitations, paroxysmal  nocturnal dyspnea or shortness of breath Dermatological: negative for rash Respiratory: negative for cough or wheezing Urologic: negative for hematuria Abdominal: negative for nausea, vomiting, diarrhea, bright red blood per rectum, melena, or hematemesis Neurologic: negative for visual changes, syncope, or dizziness All other systems reviewed and are otherwise negative except as noted above.    Blood pressure 110/63, pulse 78, height 6\' 3"  (1.905 m), weight 273 lb (123.8 kg).  General appearance: alert, cooperative, no distress, moderately obese and in wheel chair Neck: no carotid bruit and no JVD Lungs: clear to auscultation bilaterally Heart: regular rate and rhythm Extremities: Lt BKA, Rt great toe amputation Skin: Skin color, texture, turgor normal. No rashes or lesions Neurologic: Grossly normal  EKG NSR, J point elevation leads 1 and 2 with Q wave in AVF  ASSESSMENT AND PLAN:   NSTEMI (non-ST elevated myocardial infarction) (HCC) NSTEMI 04/07/17- medical Rx.  CAD S/P DES PCI to mLAD: Xience DES 2.75 x 15 (3.0 mm) LAD DES 2010. Cath Oct 2018 after NSTEMI- high grade pCFX and pRCA with L-R collateral and patent LAD. Turned down for CABG secondary to co morbidities, not a good PCI candidate  Essential hypertension Controlled  S/P BKA (below knee amputation) (HCC) H/O Lt BKA  DM (diabetes mellitus), type 2 (HCC) With gastroparesis  COPD (chronic obstructive pulmonary disease) (HCC) Severe obstructive lung disease by PFTs   PLAN  Continued medical Rx- I did not change anything today. I encouraged him to f/u with his PCP re Compazine and Phenergan use. The pt tells me he has had an  extensive GI work up at [facility] in the past.   Corine Shelter PA-C 04/19/2017 1:33 PM

## 2017-04-19 NOTE — Patient Outreach (Signed)
[facility]) Care Management  04/19/2017  Samuel BusingJohn D [name] [birth year] [id number]   Patient was called regarding medication assistance. Unfortunately, patient did not answer the phone. HIPAA compliant message left on the patient's voicemail.  Plan:  Call patient back in 1-3 business days.   Beecher McardleKatina J. Zell Doucette, PharmD, BCACP [facility] Clinical Pharmacist [phone]

## 2017-04-19 NOTE — Assessment & Plan Note (Signed)
NSTEMI 04/07/17- medical Rx.

## 2017-04-19 NOTE — Assessment & Plan Note (Signed)
With gastroparesis 

## 2017-04-19 NOTE — Patient Outreach (Signed)
[facility]) Care Management  04/19/2017  Samuel [name] March 04, 1951 [id number]   CSW was able to make initial contact with patient today to perform phone assessment, as well as assess and assist with social work needs and services.  CSW introduced self, explained role and types of services provided through [facility] [facility] ([place] [facility]).  CSW further explained to patient that CSW works with Marthenia Rolling, Hospital Liaison with [facility], and individual making the referral. CSW then explained the reason for the call, indicating that Ms. [place] Crane thought that patient would benefit from social work services and resources to assist with applying for Adult Medicaid, obtaining financial assistance to help pay medical expenses and assistance with arranging transportation to and from his physician appointments.  CSW obtained two HIPAA compliant identifiers from patient, which included patient's name and date of birth. CSW was able to have a lengthy conversation with this extremely pleasant and most appreciative gentleman today.  Patient reported that he is already working with his local congressman to see about getting his Adult Medicaid application approved.  Patient has already submitted all his paperwork, he is just awaiting a final approval/denial.  CSW then inquired as to whether or not patient needed assistance in paying his medical bills and/or monthly expenses.  Patient denied, indicating that he has already been able to work out a payment plan with [employer].  In addition, patient reported that his family members have been able to assist him in paying his rent and utility bills.  Patient was already aware of all the agencies that we often refer clients to that offer financial assistance. Patient reported that he has his own vehicle and is able to transport himself to and from all his physician appointments.  CSW inquired as to whether or not patient  would be interested in completing his Advanced Directives ([place] documents), as CSW noted that patient was recently made a DNR (Do Not Resuscitate) while hospitalized.  Patient did not feel the need to complete these documents, reporting that everyone already knows his wishes and will respect them, in the event that he is unable to make appropriate decisions for himself.  CSW explained that an RNCM will be making contact with him to provide disease management services, as well as a pharmacist, to offer medication management and to see if patient qualifies for financial assistance to pay for some of his prescription medications.  Patient is aware that both of these individuals work with [facility], and is agreeable to accepting their services.   CSW was able to ensure that patient has the correct contact information for CSW, encouraging patient to contact CSW directly if social work needs arise in the near future.  Patient was most appreciative of the call and was agreeable to calling CSW if he is able to think of any social work specific needs.  Patient plans to talk it over with his family and see if they are able to "come up with anything".  CSW will perform a case closure on patient, as all goals of treatment have been met from social work standpoint and no additional social work needs have been identified at this time.  CSW will notify patient's RNCM with [facility], Tish Men of CSW's plans to close patient's case.  CSW will fax an update to patient's Primary Care Physician, Dr. Jani Gravel to ensure that they are aware of  CSW's involvement with patient's plan of care.  CSW will submit a case closure request to Teresa Elkins, Care Management Assistant with [facility], in the form of an In Basket message.  CSW will ensure that Mrs. Elkins is aware of Mrs. Satterfield's, RNCM  with [facility], continued involvement with patient's care. Joanna Saporito, BSW, MSW, LCSW  Licensed Clinical Social Worker  [facility] Care Management [place]  Mailing Address-[address], [place], [place] [address] Physical Address-[address], [place], [place] [address] Toll Free Main # [phone] Fax # [phone] Cell # [phone]  Office # [phone] [email or web address]        

## 2017-04-19 NOTE — Assessment & Plan Note (Signed)
Severe obstructive lung disease by PFTs

## 2017-04-19 NOTE — Patient Instructions (Signed)
Corine ShelterLuke Kilroy, PA-C, recommends that you schedule a follow-up appointment in 3 months.  If you need a refill on your cardiac medications before your next appointment, please call your pharmacy.

## 2017-04-19 NOTE — Assessment & Plan Note (Signed)
H/O Lt BKA

## 2017-04-19 NOTE — Assessment & Plan Note (Addendum)
LAD DES 2010. Cath Oct 2018 after NSTEMI- high grade pCFX and pRCA with L-R collateral and patent LAD. Turned down for CABG secondary to co morbidities, not a good PCI candidate

## 2017-04-22 ENCOUNTER — Telehealth: Payer: Self-pay | Admitting: *Deleted

## 2017-04-22 NOTE — Telephone Encounter (Signed)
Open error 

## 2017-04-23 ENCOUNTER — Other Ambulatory Visit: Payer: Self-pay | Admitting: Pharmacist

## 2017-04-23 ENCOUNTER — Other Ambulatory Visit: Payer: Self-pay

## 2017-04-23 ENCOUNTER — Encounter: Payer: Self-pay | Admitting: Pharmacist

## 2017-04-23 DIAGNOSIS — J449 Chronic obstructive pulmonary disease, unspecified: Secondary | ICD-10-CM | POA: Diagnosis not present

## 2017-04-23 DIAGNOSIS — I252 Old myocardial infarction: Secondary | ICD-10-CM | POA: Diagnosis not present

## 2017-04-23 DIAGNOSIS — E1142 Type 2 diabetes mellitus with diabetic polyneuropathy: Secondary | ICD-10-CM | POA: Diagnosis not present

## 2017-04-23 DIAGNOSIS — K3184 Gastroparesis: Secondary | ICD-10-CM | POA: Diagnosis not present

## 2017-04-23 DIAGNOSIS — L97411 Non-pressure chronic ulcer of right heel and midfoot limited to breakdown of skin: Secondary | ICD-10-CM | POA: Diagnosis not present

## 2017-04-23 DIAGNOSIS — E11621 Type 2 diabetes mellitus with foot ulcer: Secondary | ICD-10-CM | POA: Diagnosis not present

## 2017-04-23 DIAGNOSIS — E1169 Type 2 diabetes mellitus with other specified complication: Secondary | ICD-10-CM | POA: Diagnosis not present

## 2017-04-23 DIAGNOSIS — I10 Essential (primary) hypertension: Secondary | ICD-10-CM | POA: Diagnosis not present

## 2017-04-23 DIAGNOSIS — I214 Non-ST elevation (NSTEMI) myocardial infarction: Secondary | ICD-10-CM | POA: Diagnosis not present

## 2017-04-23 DIAGNOSIS — E1151 Type 2 diabetes mellitus with diabetic peripheral angiopathy without gangrene: Secondary | ICD-10-CM | POA: Diagnosis not present

## 2017-04-23 DIAGNOSIS — E1165 Type 2 diabetes mellitus with hyperglycemia: Secondary | ICD-10-CM | POA: Diagnosis not present

## 2017-04-23 DIAGNOSIS — I2510 Atherosclerotic heart disease of native coronary artery without angina pectoris: Secondary | ICD-10-CM | POA: Diagnosis not present

## 2017-04-23 DIAGNOSIS — F329 Major depressive disorder, single episode, unspecified: Secondary | ICD-10-CM | POA: Diagnosis not present

## 2017-04-23 DIAGNOSIS — E1143 Type 2 diabetes mellitus with diabetic autonomic (poly)neuropathy: Secondary | ICD-10-CM | POA: Diagnosis not present

## 2017-04-23 NOTE — Patient Outreach (Signed)
[facility]) Care Management  [facility]   04/23/2017  Samuel [name] 1950-08-31 [id number]  Subjective: Patient called back after a message was left for him. HIPAA identifiers were obtained. Patient is a 66 year old male with multiple medical conditions including but not limited to:  OSA, Pulmonary Embolism, type 2 diabetes, back pain, allergic rhinitis, COPD, hypertension, GERD, hyperlipidemia, CAD and was hospitalized 10/18 for NSTEMI.    Patient uses [facility] and his sister fills a pill box for him.  Objective:   Encounter Medications: Outpatient Encounter Medications as of 04/23/2017  Medication Sig Note  . amLODipine (NORVASC) 10 MG tablet Take 1 tablet (10 mg total) by mouth daily.   [name]. apixaban (ELIQUIS) 5 MG TABS tablet Take 1 tablet (5 mg total) by mouth 2 (two) times daily.   [name]. aspirin 81 MG EC tablet Take 1 tablet (81 mg total) by mouth daily.   [name]. atorvastatin (LIPITOR) 80 MG tablet Take 1 tablet (80 mg total) by mouth daily at 6 PM.   . Canagliflozin (INVOKANA) 100 MG TABS Take 100 mg by mouth See admin instructions. invokana 100 mg daily. Alternating every other week between Jardiance and Invokana 11/19/2016: Pt not sure which day of week he switches to jardiance    . clopidogrel (PLAVIX) 75 MG tablet Take 1 tablet (75 mg total) by mouth daily.   . diphenhydramine-acetaminophen (TYLENOL PM) 25-500 MG TABS tablet Take 1 tablet by mouth at bedtime. 04/23/2017: TAKE AS NEEDED  . docusate sodium (COLACE) 100 MG capsule Take 100 mg by mouth daily as needed (for constipation).    [name]. empagliflozin (JARDIANCE) 10 MG TABS tablet Take 10 mg by mouth See admin instructions. Jardiance 10 mg daily.  Alternating every other week between Jardiance and Invokana 11/19/2016: *patient receives samples from MD* Alternating every other week between CreteJardiance and Invokana    . ferrous sulfate 325 (65 FE) MG tablet Take 325 mg by mouth every evening.    . furosemide (LASIX) 20  MG tablet Take 1 tablet (20 mg total) by mouth daily.   [name]. gabapentin (NEURONTIN) 300 MG capsule Take 300 mg by mouth 3 (three) times daily. 10/06/2014: .  . insulin detemir (LEVEMIR) 100 UNIT/ML injection Inject 85 Units into the skin 2 (two) times daily.  04/18/2017: Patient is now taking 87 units  . insulin lispro (HUMALOG) 100 UNIT/ML injection Inject 0-10 Units into the skin 3 (three) times daily with meals. Inject SQ per sliding scale 150-250 (5 units), 251-350(8 units), 351-450(10 units), >450=10 units   . lansoprazole (PREVACID) 30 MG capsule Take 30 mg by mouth 2 (two) times daily.   . metoCLOPramide (REGLAN) 10 MG tablet Take 10 mg by mouth at bedtime.    . metoprolol tartrate (LOPRESSOR) 25 MG tablet Take 1 tablet (25 mg total) by mouth 2 (two) times daily.   . nitroGLYCERIN (NITROSTAT) 0.4 MG SL tablet Place 1 tablet (0.4 mg total) under the tongue every 5 (five) minutes as needed for chest pain.   . pioglitazone (ACTOS) 30 MG tablet Take 30 mg by mouth at bedtime.    . polyethylene glycol (MIRALAX / GLYCOLAX) packet Take 17 g by mouth daily as needed for moderate constipation.   . prochlorperazine (COMPAZINE) 10 MG tablet Take 10 mg by mouth daily as needed for nausea/vomiting.   . promethazine (PHENERGAN) 25 MG tablet Take 25 mg by mouth every 8 (eight) hours as needed for nausea or vomiting. Take one tablet by mouth  every 8 hours as needed nausea/vomiting   . ranitidine (ZANTAC) 150 MG tablet Take 150 mg by mouth at bedtime.    . ranolazine (RANEXA) 500 MG 12 hr tablet Take 1 tablet (500 mg total) by mouth 2 (two) times daily.   . traMADol (ULTRAM) 50 MG tablet Take 100 mg by mouth every 12 (twelve) hours as needed (pain).    No facility-administered encounter medications on file as of 04/23/2017.     Functional Status: In your present state of health, do you have any difficulty performing the following activities: 04/19/2017 04/07/2017  Hearing? N N  Vision? N N  Difficulty  concentrating or making decisions? N N  Walking or climbing stairs? Y N  Comment - -  Dressing or bathing? N N  Doing errands, shopping? N N  Preparing Food and eating ? N -  Using the Toilet? N -  In the past six months, have you accidently leaked urine? N -  Do you have problems with loss of bowel control? N -  Managing your Medications? N -  Managing your Finances? Y -  Housekeeping or managing your Housekeeping? Y -  Some recent data might be hidden    Fall/Depression Screening: Fall Risk  04/19/2017 05/03/2014 03/12/2013  Falls in the past year? Yes No Yes  Number falls in past yr: 2 or more - 2 or more  Injury with Fall? Yes - -  Risk Factor Category  High Fall Risk - High Fall Risk  Risk for fall due to : History of fall(s);Impaired balance/gait;Impaired mobility - Impaired balance/gait;History of fall(s)  Follow up Education provided;Falls prevention discussed - -   PHQ 2/9 Scores 04/19/2017 05/03/2014 03/12/2013  PHQ - 2 Score 0 0 0      Assessment: Patient was recently discharged from hospital and all medications have been reviewed.   Drugs sorted by system:  Neurologic/Psychologic: Gabapentin  Cardiovascular: Aspirin Amlodipine Eliquis Atorvastatin Clopidogrel Furosemide Nitroglycerin Metoprolol Ranexa  Gastrointestinal: Lansoprazole Docusate Promethazine Prochlorperazine Ranitidine Polyethylene Glycol   Endocrine: Pioglitazone Humalog Levemir Jardiance Invokana  Pain: Tramadol  Vitamins/Minerals: Ferrous Sulfate  Miscellaneous: Acetaminophen/Diphenhydramine  Medication Review Findings:  Drug-Drug Interactions  1.  Metoclopramide/prochlorperazine-combination puts patient at risk of extrapyramidal side effects. One of these medications should be discontinued if deemed therapeutically appropriate.  2. Promethazine/Metoclopramide-combination puts patient at risk of extrapyramidal side effects.  ([facility] Nigel MormonVictoria  Satterfield that although he has them all, he only takes prochlorperazine)   Therapeutic Duplication  Ranitidine/Lansoprazole-it is unclear why patient would need both medications  Jardiance/Invokana-patient said he is alternating between the two products due to sample supply  Potential Dose Too High  Lansoprazole- BID dosing versus daily dosing  Omitted Medications: Janumet or Januvia-patient had samples of both of these in the home ([facility] Nigel MormonVictoria Satterfield)   Medication Assistance Findings:  -patient has spent $144 in true-out-of pocket costs -[employer] Patient Assistance Program requires patients to spend 3% of their income on medication expenses -the Application for Ranexa was completed while the patient was hospitalized. -patient was educated on the need for financial documents to prove his income. -patient has Health Team Advantage Plan II -patient most likely is eligible for [employer]'s Patient Assistance Program to receive Jardiance.   Plan:  -Complete an Extra Help Application with the patient -Assist patient with obtaining the "Benefits Awards Letter" from the [employer] Website -Conduct a home visit with the patient this week.   Beecher McardleKatina J. Partick Musselman, PharmD, BCACP [facility] Clinical  Pharmacist [phone]

## 2017-04-23 NOTE — Patient Outreach (Addendum)
[facility]) Care Management  04/23/2017  Samuel BusingJohn D [name] 01-30-1951 [id number]    Patient was called regarding medication assistance. Unfortunately, patient did not answer the phone. HIPAA compliant message left on the patient's voicemail.  Plan:  Call patient back in 1-3 business days.   Beecher McardleKatina J. Arrabella Westerman, PharmD, BCACP [facility] Pharmacist [phone]

## 2017-04-24 NOTE — Patient Outreach (Signed)
[facility]) Care Management  [facility]  04/23/2017  Beckie BusingJohn D Harrison 02-23-1951 [id number]   Home visit completed with patient. Patient's cousin Sedalia MutaDiane was also present for the visit. She is primary caregiver for patient. Also, Michae KavaHannah Hall, RN nursing student with [employer] was present for this visit.   Subjective: Patient stated that has been doing really well since last week.  Objective: BP 120/62   P 74   SpO2 96% Lung sounds clear to auscultation. Respirations even and unlabored. Heart normal rate and rhythm  No edema noted Skin warm and dry  Encounter Medications:  Outpatient Encounter Medications as of 04/23/2017  Medication Sig Note  . amLODipine (NORVASC) 10 MG tablet Take 1 tablet (10 mg total) by mouth daily.   [name]. apixaban (ELIQUIS) 5 MG TABS tablet Take 1 tablet (5 mg total) by mouth 2 (two) times daily.   [name]. aspirin 81 MG EC tablet Take 1 tablet (81 mg total) by mouth daily.   [name]. atorvastatin (LIPITOR) 80 MG tablet Take 1 tablet (80 mg total) by mouth daily at 6 PM.   . Canagliflozin (INVOKANA) 100 MG TABS Take 100 mg by mouth See admin instructions. invokana 100 mg daily. Alternating every other week between Jardiance and Invokana 11/19/2016: Pt not sure which day of week he switches to jardiance    . clopidogrel (PLAVIX) 75 MG tablet Take 1 tablet (75 mg total) by mouth daily.   . diphenhydramine-acetaminophen (TYLENOL PM) 25-500 MG TABS tablet Take 1 tablet by mouth at bedtime. 04/23/2017: TAKE AS NEEDED  . docusate sodium (COLACE) 100 MG capsule Take 100 mg by mouth daily as needed (for constipation).    [name]. empagliflozin (JARDIANCE) 10 MG TABS tablet Take 10 mg by mouth See admin instructions. Jardiance 10 mg daily.  Alternating every other week between Jardiance and Invokana 11/19/2016: *patient receives samples from MD* Alternating every other week between HastingsJardiance and Invokana    . ferrous sulfate 325 (65 FE) MG tablet Take 325 mg by mouth every evening.    .  furosemide (LASIX) 20 MG tablet Take 1 tablet (20 mg total) by mouth daily.   [name]. gabapentin (NEURONTIN) 300 MG capsule Take 300 mg by mouth 3 (three) times daily. 10/06/2014: .  . ibuprofen (ADVIL,MOTRIN) 200 MG tablet Take 400 mg every 6 (six) hours as needed by mouth for headache or pain.   [name]. insulin detemir (LEVEMIR) 100 UNIT/ML injection Inject 85 Units into the skin 2 (two) times daily.  04/18/2017: Patient is now taking 87 units  . insulin lispro (HUMALOG) 100 UNIT/ML injection Inject 0-10 Units into the skin 3 (three) times daily with meals. Inject SQ per sliding scale 150-250 (5 units), 251-350(8 units), 351-450(10 units), >450=10 units   . lansoprazole (PREVACID) 30 MG capsule Take 30 mg by mouth 2 (two) times daily.   . metoCLOPramide (REGLAN) 10 MG tablet Take 10 mg by mouth at bedtime.    . metoprolol tartrate (LOPRESSOR) 25 MG tablet Take 1 tablet (25 mg total) by mouth 2 (two) times daily.   . nitroGLYCERIN (NITROSTAT) 0.4 MG SL tablet Place 1 tablet (0.4 mg total) under the tongue every 5 (five) minutes as needed for chest pain.   . pioglitazone (ACTOS) 30 MG tablet Take 30 mg by mouth at bedtime.    . polyethylene glycol (MIRALAX / GLYCOLAX) packet Take 17 g by mouth daily as needed for moderate constipation.   . prochlorperazine (COMPAZINE) 10 MG tablet Take 10 mg by mouth  daily as needed for nausea/vomiting.   . promethazine (PHENERGAN) 25 MG tablet Take 25 mg by mouth every 8 (eight) hours as needed for nausea or vomiting. Take one tablet by mouth every 8 hours as needed nausea/vomiting   . ranitidine (ZANTAC) 150 MG tablet Take 150 mg by mouth at bedtime.    . ranolazine (RANEXA) 500 MG 12 hr tablet Take 1 tablet (500 mg total) by mouth 2 (two) times daily.   . traMADol (ULTRAM) 50 MG tablet Take 100 mg by mouth every 12 (twelve) hours as needed (pain).    No facility-administered encounter medications on file as of 04/23/2017.     Functional Status:  In your present state of  health, do you have any difficulty performing the following activities: 04/19/2017 04/07/2017  Hearing? N N  Vision? N N  Difficulty concentrating or making decisions? N N  Walking or climbing stairs? Y N  Comment - -  Dressing or bathing? N N  Doing errands, shopping? N N  Preparing Food and eating ? N -  Using the Toilet? N -  In the past six months, have you accidently leaked urine? N -  Do you have problems with loss of bowel control? N -  Managing your Medications? N -  Managing your Finances? Y -  Housekeeping or managing your Housekeeping? Y -  Some recent data might be hidden    Fall/Depression Screening: Fall Risk  04/19/2017 05/03/2014 03/12/2013  Falls in the past year? Yes No Yes  Number falls in past yr: 2 or more - 2 or more  Injury with Fall? Yes - -  Risk Factor Category  High Fall Risk - High Fall Risk  Risk for fall due to : History of fall(s);Impaired balance/gait;Impaired mobility - Impaired balance/gait;History of fall(s)  Follow up Education provided;Falls prevention discussed - -   PHQ 2/9 Scores 04/19/2017 05/03/2014 03/12/2013  PHQ - 2 Score 0 0 0    Assessment:  Patient is alert and oriented. He had no complaints or concerns during this visit. Patient stated that is gastroparesis has been a little better this week. Patient currently has compazine, phenergan and reglan on his medication list. RNCM discussed patient's medication regimen. Patient reported that he used to be on phenergan but it stopped working as well so his doctor switched him to compazine as his first medication of choice when suffering with nausea and vomiting related to his gastroparesis. Patient stated that if after 3-4 hours the compazine is not working, he will also take a phenergan. He stated that if after a few hours that also does not work, then he takes a Garment/textile technologist. He stated if none of those work, he usually has to go to the emergency room. He reported that in the past 2-3 weeks, he has not  needed to take anything beyond the initial compazine.   RNCM and student nurse reviewed medications with patient and [name] (who fills his pill box for him). [name] was pulling medications out of a red basket and would ask patient if he is taking the medication she pulled out. It was unclear initially how she was filling the pillbox when she was not aware of what patient's current medications are. RNCM asked her to demonstrate how she knows what to put in the pillbox. Patient is very knowledgeable about what his current medications are, but has difficulty reading the labels. So, he and [name] will fill his pillbox together and he will tell her what to put in  th box. However, they do not put all of his medications in the pillbox and he keeps some in the bottles in a small basket near his chair. In the basket are his prn medications and also his gabapentin. Patient does not take gabapentin 4 times a day as directed, but only takes 2 capsules at bedtime. [name] and patient together, with a list were able to correctly identify all of patient's current medications.  Patient has received samples from his PCP for some of his diabetic medications. He is currently taking the following: Invokana 100 mg OR Jardiance 10 mg - he is able to verbalize that he does not take them on the same day and that he usually takes one of the medications until he runs out of that sample and then begins the next one. He usually has Invokana 100 mg, but in the event he has a sample of 300 mg, he stated that he was directed to cut it in half and take 150 mg.   He is also taking  Januvia 100 mg OR Janumet 100 mg/1000mg  and alternates them in the same way.  Patient is also able to correctly verbalize how he takes his sliding scale insulin, although he does not have it written down. Student nurse wrote instructions for patient's sliding scale so that patient could place it on the fridge in case he needed to review it.  Patient reported that  he has not yet checked his blood sugar today. He stated he just has not had time. Patient reported that his fasting blood sugar was 217 yesterday morning.   Patient stated that whoever had told him they were bringing him scales, a BP cuff and pulse oximeter has not come and he does not recall who that was. RNCM advised can assist with the BP cuff and possibly the scales.   Patient currently denies any concerns or needs at present. He denies any chest pain or shortness of breath. He is doing much better this week and has followed up with the cardiologist. He did mention to the cardiologist about his wheezing last week, but it had resolved when he presented to his office visit, so nothing new was ordered. No changes were made to his existing medications as well. It was noted that he should follow up with PCP regarding compazine and phenergan use and complaints of sleepiness, but patient reported to RNCm that he has not been using both medications together since discharge.   Plan:  RNCM to follow up with patient within the next week regarding blood pressure cuff. Will also continue to follow for transition of care. Will begin to provide education and resources for his diabetes as this is not well controlled.   TurkeyVictoria R. Gibbs Naugle, RN, BSN, CCM [facility] Care Management Coordinator [phone]

## 2017-04-26 ENCOUNTER — Other Ambulatory Visit: Payer: Self-pay | Admitting: Pharmacist

## 2017-04-26 ENCOUNTER — Ambulatory Visit: Payer: Self-pay | Admitting: Pharmacist

## 2017-04-26 NOTE — Care Management (Signed)
04/26/2017 at 10:30 am: [facility] that they have not received faxed [facility] Ran[facility]. CM called [facility] and spoke to Burt KnackSharon Martin, RN for Dr Herbie BaltimoreHarding. Per Jasmine DecemberSharon the form is filled out and needs to be signed by Dr Allyson SabalBerry. His assistant Karna Dupes(Taylor Silver) is to [facility] the [facility] after [facility] is signed. [facility] updated.

## 2017-04-26 NOTE — Patient Outreach (Addendum)
[facility]) Care Management  [facility]   04/26/2017  Samuel [name] 1950/08/24 [id number]  Subjective: Home visit completed at the patient's home.  Patient's caregiver, Sedalia MutaDiane, was present for the visit. Patient is a 66 year old male with multiple medical conditions including but not limited to:  OSA, Pulmonary Embolism, type 2 diabetes, back pain, allergic rhinitis, COPD, hypertension, GERD, hyperlipidemia, CAD and was hospitalized 10/18 for NSTEMI.    Patient uses [facility] and his caregiver fills a seven day pill box for him.  Objective:   Encounter Medications: Outpatient Encounter Medications as of 04/26/2017  Medication Sig Note  . apixaban (ELIQUIS) 5 MG TABS tablet Take 1 tablet (5 mg total) by mouth 2 (two) times daily.   [name]. aspirin 81 MG EC tablet Take 1 tablet (81 mg total) by mouth daily.   [name]. atorvastatin (LIPITOR) 80 MG tablet Take 1 tablet (80 mg total) by mouth daily at 6 PM.   . clopidogrel (PLAVIX) 75 MG tablet Take 1 tablet (75 mg total) by mouth daily.   [name]. docusate sodium (COLACE) 100 MG capsule Take 100 mg by mouth daily as needed (for constipation).    [name]. empagliflozin (JARDIANCE) 10 MG TABS tablet Take 10 mg by mouth See admin instructions. Jardiance 10 mg daily.  Alternating every other week between Jardiance and Invokana 04/26/2017: Had both Jardiance 10mg  and 25mg  samples in his possession--alternates with Invokana  . ferrous sulfate 325 (65 FE) MG tablet Take 325 mg by mouth every evening.    . furosemide (LASIX) 20 MG tablet Take 1 tablet (20 mg total) by mouth daily.   [name]. ibuprofen (ADVIL,MOTRIN) 200 MG tablet Take 400 mg every 6 (six) hours as needed by mouth for headache or pain.   [name]. insulin detemir (LEVEMIR) 100 UNIT/ML injection Inject 85 Units into the skin 2 (two) times daily.  04/18/2017: Patient is now taking 87 units  . insulin lispro (HUMALOG) 100 UNIT/ML injection Inject 0-10 Units into the skin 3 (three) times daily with meals.  Inject SQ per sliding scale 150-250 (5 units), 251-350(8 units), 351-450(10 units), >450=10 units   . lansoprazole (PREVACID) 30 MG capsule Take 30 mg by mouth 2 (two) times daily.   . metoCLOPramide (REGLAN) 5 MG tablet Take 10 mg by mouth at bedtime.  04/26/2017: PRN   . metoprolol tartrate (LOPRESSOR) 25 MG tablet Take 1 tablet (25 mg total) by mouth 2 (two) times daily.   . nitroGLYCERIN (NITROSTAT) 0.4 MG SL tablet Place 1 tablet (0.4 mg total) under the tongue every 5 (five) minutes as needed for chest pain.   . pioglitazone (ACTOS) 30 MG tablet Take 30 mg by mouth at bedtime.    . polyethylene glycol (MIRALAX / GLYCOLAX) packet Take 17 g by mouth daily as needed for moderate constipation.   . prochlorperazine (COMPAZINE) 10 MG tablet Take 10 mg by mouth daily as needed for nausea/vomiting.   . promethazine (PHENERGAN) 25 MG tablet Take 25 mg by mouth every 8 (eight) hours as needed for nausea or vomiting. Take one tablet by mouth every 8 hours as needed nausea/vomiting   . ranitidine (ZANTAC) 150 MG tablet Take 150 mg by mouth at bedtime.    . ranolazine (RANEXA) 500 MG 12 hr tablet Take 1 tablet (500 mg total) by mouth 2 (two) times daily.   . traMADol (ULTRAM) 50 MG tablet Take 100 mg by mouth every 12 (twelve) hours as needed (pain).   [name]. amLODipine (NORVASC) 10  MG tablet Take 1 tablet (10 mg total) by mouth daily.   . Canagliflozin (INVOKANA) 100 MG TABS Take 100 mg by mouth See admin instructions. invokana 100 mg daily. Alternating every other week between Jardiance and Invokana 04/26/2017: Invokana 300mg  in the home. Patient alternates between [place] and Jardiance depending on which samples he has.  . diphenhydramine-acetaminophen (TYLENOL PM) 25-500 MG TABS tablet Take 1 tablet by mouth at bedtime. 04/23/2017: TAKE AS NEEDED  . gabapentin (NEURONTIN) 300 MG capsule Take 300 mg by mouth 3 (three) times daily. 10/06/2014: .   No facility-administered encounter medications on file as of  04/26/2017.     Functional Status: In your present state of health, do you have any difficulty performing the following activities: 04/19/2017 04/07/2017  Hearing? N N  Vision? N N  Difficulty concentrating or making decisions? N N  Walking or climbing stairs? Y N  Comment - -  Dressing or bathing? N N  Doing errands, shopping? N N  Preparing Food and eating ? N -  Using the Toilet? N -  In the past six months, have you accidently leaked urine? N -  Do you have problems with loss of bowel control? N -  Managing your Medications? N -  Managing your Finances? Y -  Housekeeping or managing your Housekeeping? Y -  Some recent data might be hidden    Fall/Depression Screening: Fall Risk  04/19/2017 04/12/2017 05/03/2014  Falls in the past year? Yes Yes No  Number falls in past yr: 2 or more 2 or more -  Injury with Fall? Yes Yes -  Risk Factor Category  High Fall Risk High Fall Risk -  Risk for fall due to : History of fall(s);Impaired balance/gait;Impaired mobility History of fall(s);Impaired balance/gait;Medication side effect;Impaired mobility -  Follow up Education provided;Falls prevention discussed Falls evaluation completed;Falls prevention discussed;Education provided -   PHQ 2/9 Scores 04/19/2017 04/12/2017 05/03/2014 03/12/2013  PHQ - 2 Score 0 0 0 0    Assessment: Patient was recently discharged from hospital and all medications have been reviewed.  Neurologic/Psychologic: Gabapentin  Cardiovascular: Aspirin Amlodipine Eliquis Atorvastatin Clopidogrel Furosemide Nitroglycerin Metoprolol Ranexa  Gastrointestinal: Lansoprazole Docusate Promethazine-prn only Prochlorperazine-prn only Ranitidine Polyethylene Glycol Metoclopramide-prn only  Endocrine: Pioglitazone Humalog Levemir Jardiance Invokana  Pain: Tramadol  Vitamins/Minerals: Ferrous Sulfate   Medication Review Findings:  Adherence:  -Clopidogrel tablets were found in the am and  pm slots (the pm slot was removed and patient was educated)  -Jardiance- was not in the pill box. Jardiance 10mg  was added to the pill box (patient said he alternates between [place] and Invokana depending on what he has. Since the Invokana samples he had in his possession were 300mg  and what was on the discharge summary was Invokana 100mg --Jaridance 10mg  was added to the pill box.  Medication Storage Issue:  -Toujeo and Humalog samples were not being stored in the refrigerator. Patient was educated on proper insulin storage and the manufacturer recommended shelf life when insulin is not stored in the freezer.     Medication Dosage Discrepancies:  -Invokana- patient had 300mg  tablet samples versus 100mg  as was listed on the patients medication.  - Metoclopramide-patient had Metoclopramide 5mg  tablets in his possession versus 10mg .-(medication list changed)--Patient reported he does NOT take metoclopramide,prochlorperazine and promethazine at the same time  Jardiance-patient had both Jardiance 10mg  and 25mg  samples in his possession (these were separated and only the 10mg  Jardiance tablets were put in the patient's pill box)  Omitted medications:  Januvia/Janumet-patient  had several boxes of Januvia 100mg  samples.  Patient also had several boxes of Janumet XR 50/1000 and 100/1000, as well as Janumet 50/1000 and 50/500.  Januvia or Janumet were not on the patient's medication list or the most recent discharge summary. Patient did not have Januvia or Janumet in his pill box but it is unclear if he has been taking it or not. He was instructed not to take Januvia or Janumet until he speaks with his PCP at his upcoming appointment. Dose clarification will be needed if the patient should be taking Jaunvia/Janumet as he has several different strengths in her possession.  Patient had Precose 50mg  tablets inside each day of the pill box (these were removed since they were not on the discharge summary.  Patient was instructed to speak with his PCP at his appointment on 04/29/17.)   Medication Assistance Findings: -[facility] at [facility] in reference to the [facility] application as only part of the application was received.  -Obtained necessary signatures on patient assistance applications.  -Obtained necessary financial documentation for [employer] Application   Plan:   Route note to PCP Conduct home visit with patient after his PCP visit to review pill box filling.   Beecher McardleKatina J. Deondrae Mcgrail, PharmD, BCACP [facility] Clinical Pharmacist [phone]

## 2017-04-29 DIAGNOSIS — Z89512 Acquired absence of left leg below knee: Secondary | ICD-10-CM | POA: Diagnosis not present

## 2017-04-29 DIAGNOSIS — E118 Type 2 diabetes mellitus with unspecified complications: Secondary | ICD-10-CM | POA: Diagnosis not present

## 2017-04-29 DIAGNOSIS — Z794 Long term (current) use of insulin: Secondary | ICD-10-CM | POA: Diagnosis not present

## 2017-04-30 DIAGNOSIS — L97411 Non-pressure chronic ulcer of right heel and midfoot limited to breakdown of skin: Secondary | ICD-10-CM | POA: Diagnosis not present

## 2017-04-30 DIAGNOSIS — K3184 Gastroparesis: Secondary | ICD-10-CM | POA: Diagnosis not present

## 2017-04-30 DIAGNOSIS — E1143 Type 2 diabetes mellitus with diabetic autonomic (poly)neuropathy: Secondary | ICD-10-CM | POA: Diagnosis not present

## 2017-04-30 DIAGNOSIS — E1142 Type 2 diabetes mellitus with diabetic polyneuropathy: Secondary | ICD-10-CM | POA: Diagnosis not present

## 2017-04-30 DIAGNOSIS — E1169 Type 2 diabetes mellitus with other specified complication: Secondary | ICD-10-CM | POA: Diagnosis not present

## 2017-04-30 DIAGNOSIS — E1151 Type 2 diabetes mellitus with diabetic peripheral angiopathy without gangrene: Secondary | ICD-10-CM | POA: Diagnosis not present

## 2017-04-30 DIAGNOSIS — I10 Essential (primary) hypertension: Secondary | ICD-10-CM | POA: Diagnosis not present

## 2017-04-30 DIAGNOSIS — I214 Non-ST elevation (NSTEMI) myocardial infarction: Secondary | ICD-10-CM | POA: Diagnosis not present

## 2017-04-30 DIAGNOSIS — I252 Old myocardial infarction: Secondary | ICD-10-CM | POA: Diagnosis not present

## 2017-04-30 DIAGNOSIS — E11621 Type 2 diabetes mellitus with foot ulcer: Secondary | ICD-10-CM | POA: Diagnosis not present

## 2017-04-30 DIAGNOSIS — F329 Major depressive disorder, single episode, unspecified: Secondary | ICD-10-CM | POA: Diagnosis not present

## 2017-04-30 DIAGNOSIS — J449 Chronic obstructive pulmonary disease, unspecified: Secondary | ICD-10-CM | POA: Diagnosis not present

## 2017-04-30 DIAGNOSIS — I2510 Atherosclerotic heart disease of native coronary artery without angina pectoris: Secondary | ICD-10-CM | POA: Diagnosis not present

## 2017-04-30 DIAGNOSIS — E1165 Type 2 diabetes mellitus with hyperglycemia: Secondary | ICD-10-CM | POA: Diagnosis not present

## 2017-05-01 ENCOUNTER — Other Ambulatory Visit: Payer: Self-pay | Admitting: Pharmacist

## 2017-05-01 NOTE — Patient Outreach (Signed)
[facility]) Care Management  [facility]   05/01/2017  Samuel [name] January 02, 1951 [id number]  Subjective: Home visit completed at the patient's home.  Patient's caregiver, Sedalia MutaDiane, was present for the visit. Patient is a 66 year old male with multiple medical conditions including but not limited to: OSA, Pulmonary Embolism, type 2 diabetes, back pain, allergic rhinitis, COPD, hypertension, GERD, hyperlipidemia, CAD and was hospitalized 10/18 for NSTEMI.   Patient uses [facility] and his caregiver fills a seven day pill box for him.  Patient was seen by his PCP earlier this week and reported no medication changes.  Objective:   Encounter Medications: Outpatient Encounter Medications as of 05/01/2017  Medication Sig Note  . acarbose (PRECOSE) 50 MG tablet Take 50 mg 3 (three) times daily with meals by mouth.   [name]. amLODipine (NORVASC) 10 MG tablet Take 1 tablet (10 mg total) by mouth daily.   [name]. apixaban (ELIQUIS) 5 MG TABS tablet Take 1 tablet (5 mg total) by mouth 2 (two) times daily.   [name]. aspirin 81 MG EC tablet Take 1 tablet (81 mg total) by mouth daily.   [name]. atorvastatin (LIPITOR) 80 MG tablet Take 1 tablet (80 mg total) by mouth daily at 6 PM.   . clopidogrel (PLAVIX) 75 MG tablet Take 1 tablet (75 mg total) by mouth daily.   . diphenhydramine-acetaminophen (TYLENOL PM) 25-500 MG TABS tablet Take 1 tablet by mouth at bedtime. 04/23/2017: TAKE AS NEEDED  . docusate sodium (COLACE) 100 MG capsule Take 100 mg by mouth daily as needed (for constipation).    [name]. empagliflozin (JARDIANCE) 25 MG TABS tablet Take 10 mg See admin instructions by mouth. Alternating every other week between [place] and Invokana   . ferrous sulfate 325 (65 FE) MG tablet Take 325 mg by mouth every evening.    . furosemide (LASIX) 20 MG tablet Take 1 tablet (20 mg total) by mouth daily.   [name]. gabapentin (NEURONTIN) 300 MG capsule Take 300 mg by mouth 3 (three) times daily. 10/06/2014: .  .  ibuprofen (ADVIL,MOTRIN) 200 MG tablet Take 400 mg every 6 (six) hours as needed by mouth for headache or pain.   [name]. insulin detemir (LEVEMIR) 100 UNIT/ML injection Inject 85 Units into the skin 2 (two) times daily.  04/18/2017: Patient is now taking 87 units  . insulin lispro (HUMALOG) 100 UNIT/ML injection Inject 0-10 Units into the skin 3 (three) times daily with meals. Inject SQ per sliding scale 150-250 (5 units), 251-350(8 units), 351-450(10 units), >450=10 units   . lansoprazole (PREVACID) 30 MG capsule Take 30 mg by mouth 2 (two) times daily.   . metoCLOPramide (REGLAN) 5 MG tablet Take 10 mg by mouth at bedtime.  04/26/2017: PRN   . metoprolol tartrate (LOPRESSOR) 25 MG tablet Take 1 tablet (25 mg total) by mouth 2 (two) times daily.   . nitroGLYCERIN (NITROSTAT) 0.4 MG SL tablet Place 1 tablet (0.4 mg total) under the tongue every 5 (five) minutes as needed for chest pain.   . pioglitazone (ACTOS) 30 MG tablet Take 30 mg by mouth at bedtime.    . polyethylene glycol (MIRALAX / GLYCOLAX) packet Take 17 g by mouth daily as needed for moderate constipation.   . prochlorperazine (COMPAZINE) 10 MG tablet Take 10 mg by mouth daily as needed for nausea/vomiting.   . promethazine (PHENERGAN) 25 MG tablet Take 25 mg by mouth every 8 (eight) hours as needed for nausea or vomiting. Take one tablet by mouth  every 8 hours as needed nausea/vomiting   . ranitidine (ZANTAC) 150 MG tablet Take 150 mg by mouth at bedtime.    . ranolazine (RANEXA) 500 MG 12 hr tablet Take 1 tablet (500 mg total) by mouth 2 (two) times daily.   . sitaGLIPtin (JANUVIA) 100 MG tablet Take 100 mg daily by mouth.   . Canagliflozin (INVOKANA) 100 MG TABS Take 100 mg by mouth See admin instructions. invokana 100 mg daily. Alternating every other week between Jardiance and Invokana 04/26/2017: Invokana 300mg  in the home. Patient alternates between [place] and Jardiance depending on which samples he has.  . traMADol (ULTRAM) 50 MG tablet  Take 100 mg by mouth every 12 (twelve) hours as needed (pain).    No facility-administered encounter medications on file as of 05/01/2017.     Functional Status: In your present state of health, do you have any difficulty performing the following activities: 04/19/2017 04/07/2017  Hearing? N N  Vision? N N  Difficulty concentrating or making decisions? N N  Walking or climbing stairs? Y N  Comment - -  Dressing or bathing? N N  Doing errands, shopping? N N  Preparing Food and eating ? N -  Using the Toilet? N -  In the past six months, have you accidently leaked urine? N -  Do you have problems with loss of bowel control? N -  Managing your Medications? N -  Managing your Finances? Y -  Housekeeping or managing your Housekeeping? Y -  Some recent data might be hidden    Fall/Depression Screening: Fall Risk  04/19/2017 04/12/2017 05/03/2014  Falls in the past year? Yes Yes No  Number falls in past yr: 2 or more 2 or more -  Injury with Fall? Yes Yes -  Risk Factor Category  High Fall Risk High Fall Risk -  Risk for fall due to : History of fall(s);Impaired balance/gait;Impaired mobility History of fall(s);Impaired balance/gait;Medication side effect;Impaired mobility -  Follow up Education provided;Falls prevention discussed Falls evaluation completed;Falls prevention discussed;Education provided -   PHQ 2/9 Scores 04/19/2017 04/12/2017 05/03/2014 03/12/2013  PHQ - 2 Score 0 0 0 0      Assessment: Patient was recently discharged from hospital and all medications have been reviewed.  Patient's medication list from his most recent PCP visit was also reviewed.  Drugs sorted by system:  Neurologic/Psychologic: Gabapentin  Cardiovascular: Aspirin Amlodipine Eliquis Atorvastatin Clopidogrel Furosemide Nitroglycerin Metoprolol Ranexa  Gastrointestinal: Lansoprazole Docusate Promethazine-prn only Prochlorperazine-prn only Ranitidine Polyethylene  Glycol Metoclopramide-prn only  Endocrine: Pioglitazone Acarbose  Humalog Levemir Jardiance Januvia Invokana (has samples but is not taking right now)  Pain: Tramadol Acetaminophen/Diphenhyramine Ibuprofen  Vitamins/Minerals: Ferrous Sulfate   Medication Review Findings:  Omitted medications: Januvia and Acarbose were listed on the medication list from the patient's PCP. Patient confirmed he is taking both medications and they were added to his medication list.  Clopidogrel, Ranexa and Atorvastatin were not on the medication list from the patient's PCP.  Dr. Elmyra Ricks office was called. Ann from Dr. Elmyra Ricks office called back and the medication list discrepancies were reviewed via telephone. Ann requested that I send my note and the medication findings.   Multiple variations of samples:  Janumet-patient several boxes of Janumet XR 50/1000 and 100/1000, as well as Janumet 50/1000 and 50/500. Janumet was not on the patient's current medication list, the most recent discharge summary, or the medication list from the patient's PCP. Patient was instructed to put Januvia in his pill box and not  to use Janumet until clarification was obtained from his provider.  Jardiance-patient had both Jardiance 10mg  and 25mg  samples.  The medication list from the patient's PCP stated Jardiance 25mg  so that is what the patient was instructed to place in the box.  Dose Discrepancy Invokana-the medication list from the provider's office, the discharge summary and the [facility] the patient taking Invokana 100mg .  Patient had Invokana 300mg  samples in his possession.   Adherence: Acarbose-patient only had 2 tablets of Acarbose left in the medication bottle.  Plan: Patient was provided with a personalized medication list by time with indications to assist with filling the medication box.  Fax note to Dr. Elmyra RicksKim's office attn ANN  Return to the patient's  home in 1 week to review medications and assist with pill box filling.

## 2017-05-02 ENCOUNTER — Other Ambulatory Visit: Payer: Self-pay

## 2017-05-02 ENCOUNTER — Encounter: Payer: Self-pay | Admitting: Pharmacist

## 2017-05-02 NOTE — Patient Outreach (Signed)
[facility] ([facility]) Care Management  05/02/17  Dayan D Gladden 11/07/1950 [id number]  Successful outreach completed with patient to attempt to schedule the home visit that was needed for today to assist with setting up his telemonitoring kit. Patient identification verified.  RNCM advised had spoken with [name] Boyd, [facility] pharmacist yesterday who indicated that patient had a telephone call scheduled today at 3 pm with [name] so that they could instruct him over the phone how to set up his telemonitoring kit (modem, BP cuff, pulse oximeter and scales). Patient stated that they actually came out this morning to his home and set it up for him already so home visit was not needed at present time.  He stated that he now his telemonitoring set up and has to use it for 30 days. He stated that he is supposed to get up and use it before 11 am every day, so he plans to do so between 9am and 10am. RNCM provided education about daily weights - to try to do first thing in the morning when he wakes up before eating or drinking anything, but after using the bathroom. Patient verbalized understanding. Patient stated that the person who set it up said that he will have to continue weighing, checking his blood pressure and oxygen levels after the 30 days with their equipment. Patient does not have his own scales, BP cuff or pulse oximeter. Advised that [facility] can assist patient with obtaining the scales and BP cuff, but he will need to purchase his own pulse oximeter. Advised that they are reasonably priced at [facility] or other pharmacies. Patient stated he was told that they have some for around $10 and he believes he can afford that.  Patient stated that he continues to do well. He denies any chest pain or significant shortness of breath. He stated that he does occasionally have some shortness of breath related to his decreased lung function when he is up moving around, but otherwise is doing ok.    Patient was last seen by cardiologist on 04/19/2017 by Luke Kilroy, PA. No changes were made to medications at that time. His next appointment is on 07/24/2017 with Dr. Berry.   Patient has no other concerns or needs at present time. He is currently working with [facility] pharmacist regarding his medications.   Plan: RNCM will continue to follow patient for transition of care.  Plan to follow up regarding need for PCP follow up.  Paisyn Guercio R. Gray Doering, RN, BSN, CCM [facility] Care Management Coordinator [phone]    

## 2017-05-03 ENCOUNTER — Other Ambulatory Visit: Payer: Self-pay | Admitting: Pharmacist

## 2017-05-03 NOTE — Patient Outreach (Signed)
[facility]) Care Management  05/03/2017  Beckie BusingJohn D Kook 12/03/1950 [id number]   Application faxed to [employer] for [employer].  Plan:  Follow up at home visit with patient next week.   Beecher McardleKatina J. Hawkins Seaman, PharmD, BCACP [facility] Clinical Pharmacist [phone]

## 2017-05-06 ENCOUNTER — Ambulatory Visit: Payer: PPO | Admitting: Cardiology

## 2017-05-06 DIAGNOSIS — E1169 Type 2 diabetes mellitus with other specified complication: Secondary | ICD-10-CM | POA: Diagnosis not present

## 2017-05-06 DIAGNOSIS — E11621 Type 2 diabetes mellitus with foot ulcer: Secondary | ICD-10-CM | POA: Diagnosis not present

## 2017-05-06 DIAGNOSIS — L97411 Non-pressure chronic ulcer of right heel and midfoot limited to breakdown of skin: Secondary | ICD-10-CM | POA: Diagnosis not present

## 2017-05-06 DIAGNOSIS — I214 Non-ST elevation (NSTEMI) myocardial infarction: Secondary | ICD-10-CM | POA: Diagnosis not present

## 2017-05-06 DIAGNOSIS — F329 Major depressive disorder, single episode, unspecified: Secondary | ICD-10-CM | POA: Diagnosis not present

## 2017-05-06 DIAGNOSIS — E1142 Type 2 diabetes mellitus with diabetic polyneuropathy: Secondary | ICD-10-CM | POA: Diagnosis not present

## 2017-05-06 DIAGNOSIS — J449 Chronic obstructive pulmonary disease, unspecified: Secondary | ICD-10-CM | POA: Diagnosis not present

## 2017-05-06 DIAGNOSIS — I2510 Atherosclerotic heart disease of native coronary artery without angina pectoris: Secondary | ICD-10-CM | POA: Diagnosis not present

## 2017-05-06 DIAGNOSIS — K3184 Gastroparesis: Secondary | ICD-10-CM | POA: Diagnosis not present

## 2017-05-06 DIAGNOSIS — E1143 Type 2 diabetes mellitus with diabetic autonomic (poly)neuropathy: Secondary | ICD-10-CM | POA: Diagnosis not present

## 2017-05-06 DIAGNOSIS — I252 Old myocardial infarction: Secondary | ICD-10-CM | POA: Diagnosis not present

## 2017-05-06 DIAGNOSIS — I10 Essential (primary) hypertension: Secondary | ICD-10-CM | POA: Diagnosis not present

## 2017-05-06 DIAGNOSIS — E1151 Type 2 diabetes mellitus with diabetic peripheral angiopathy without gangrene: Secondary | ICD-10-CM | POA: Diagnosis not present

## 2017-05-06 DIAGNOSIS — E1165 Type 2 diabetes mellitus with hyperglycemia: Secondary | ICD-10-CM | POA: Diagnosis not present

## 2017-05-07 ENCOUNTER — Other Ambulatory Visit: Payer: Self-pay | Admitting: Pharmacist

## 2017-05-07 NOTE — Patient Outreach (Addendum)
[facility]) Care Management  [facility]   05/07/2017  Samuel [name] 03-12-1951 [id number]  Subjective:  Home visit completed at the patient's home. Patient's caregiver, Samuel [name], was present for the visit. Patient is a 66 year old male with multiple medical conditions including but not limited to: OSA, Pulmonary Embolism, type 2 diabetes, back pain, allergic rhinitis, COPD, hypertension, GERD, hyperlipidemia, CAD and was hospitalized 10/18 for NSTEMI.   Patient uses [facility] and his caregiver fills a seven day pill box for him.  The purpose of today's visit was to review the patient's medications again and inspect his pill box.  Objective:   Encounter Medications: Outpatient Encounter Medications as of 05/07/2017  Medication Sig Note  . acarbose (PRECOSE) 50 MG tablet Take 50 mg 3 (three) times daily with meals by mouth.   [name]. amLODipine (NORVASC) 10 MG tablet Take 1 tablet (10 mg total) by mouth daily.   [name]. apixaban (ELIQUIS) 5 MG TABS tablet Take 1 tablet (5 mg total) by mouth 2 (two) times daily.   [name]. aspirin 81 MG EC tablet Take 1 tablet (81 mg total) by mouth daily.   [name]. atorvastatin (LIPITOR) 80 MG tablet Take 1 tablet (80 mg total) by mouth daily at 6 PM.   . Canagliflozin (INVOKANA) 100 MG TABS Take 100 mg by mouth See admin instructions. invokana 100 mg daily. Alternating every other week between Jardiance and Invokana 04/26/2017: Invokana 300mg  in the home. Patient alternates between Greele[place]vokana and Jardiance depending on which samples he has.  . clopidogrel (PLAVIX) 75 MG tablet Take 1 tablet (75 mg total) by mouth daily.   . diphenhydramine-acetaminophen (TYLENOL PM) 25-500 MG TABS tablet Take 1 tablet by mouth at bedtime. 04/23/2017: TAKE AS NEEDED  . docusate sodium (COLACE) 100 MG capsule Take 100 mg by mouth daily as needed (for constipation).    [name]. empagliflozin (JARDIANCE) 25 MG TABS tablet Take 10 mg See admin instructions by mouth. Alternating  every other week between [place] and Invokana   . ferrous sulfate 325 (65 FE) MG tablet Take 325 mg by mouth every evening.    . furosemide (LASIX) 20 MG tablet Take 1 tablet (20 mg total) by mouth daily.   [name]. gabapentin (NEURONTIN) 300 MG capsule Take 300 mg by mouth 3 (three) times daily. 10/06/2014: .  . ibuprofen (ADVIL,MOTRIN) 200 MG tablet Take 400 mg every 6 (six) hours as needed by mouth for headache or pain.   [name]. insulin detemir (LEVEMIR) 100 UNIT/ML injection Inject 85 Units into the skin 2 (two) times daily.  04/18/2017: Patient is now taking 87 units  . insulin lispro (HUMALOG) 100 UNIT/ML injection Inject 0-10 Units into the skin 3 (three) times daily with meals. Inject SQ per sliding scale 150-250 (5 units), 251-350(8 units), 351-450(10 units), >450=10 units   . lansoprazole (PREVACID) 30 MG capsule Take 30 mg by mouth 2 (two) times daily.   . metoCLOPramide (REGLAN) 5 MG tablet Take 10 mg by mouth at bedtime.  04/26/2017: PRN   . metoprolol tartrate (LOPRESSOR) 25 MG tablet Take 1 tablet (25 mg total) by mouth 2 (two) times daily.   . nitroGLYCERIN (NITROSTAT) 0.4 MG SL tablet Place 1 tablet (0.4 mg total) under the tongue every 5 (five) minutes as needed for chest pain.   . pioglitazone (ACTOS) 30 MG tablet Take 30 mg by mouth at bedtime.    . polyethylene glycol (MIRALAX / GLYCOLAX) packet Take 17 g by mouth daily as needed for  moderate constipation.   . prochlorperazine (COMPAZINE) 10 MG tablet Take 10 mg by mouth daily as needed for nausea/vomiting.   . promethazine (PHENERGAN) 25 MG tablet Take 25 mg by mouth every 8 (eight) hours as needed for nausea or vomiting. Take one tablet by mouth every 8 hours as needed nausea/vomiting   . ranitidine (ZANTAC) 150 MG tablet Take 150 mg by mouth at bedtime.    . ranolazine (RANEXA) 500 MG 12 hr tablet Take 1 tablet (500 mg total) by mouth 2 (two) times daily.   . sitaGLIPtin (JANUVIA) 100 MG tablet Take 100 mg daily by mouth.   . traMADol  (ULTRAM) 50 MG tablet Take 100 mg by mouth every 12 (twelve) hours as needed (pain).    No facility-administered encounter medications on file as of 05/07/2017.     Functional Status: In your present state of health, do you have any difficulty performing the following activities: 04/19/2017 04/07/2017  Hearing? N N  Vision? N N  Difficulty concentrating or making decisions? N N  Walking or climbing stairs? Y N  Comment - -  Dressing or bathing? N N  Doing errands, shopping? N N  Preparing Food and eating ? N -  Using the Toilet? N -  In the past six months, have you accidently leaked urine? N -  Do you have problems with loss of bowel control? N -  Managing your Medications? N -  Managing your Finances? Y -  Housekeeping or managing your Housekeeping? Y -  Some recent data might be hidden    Fall/Depression Screening: Fall Risk  04/19/2017 04/12/2017 05/03/2014  Falls in the past year? Yes Yes No  Number falls in past yr: 2 or more 2 or more -  Injury with Fall? Yes Yes -  Risk Factor Category  High Fall Risk High Fall Risk -  Risk for fall due to : History of fall(s);Impaired balance/gait;Impaired mobility History of fall(s);Impaired balance/gait;Medication side effect;Impaired mobility -  Follow up Education provided;Falls prevention discussed Falls evaluation completed;Falls prevention discussed;Education provided -   PHQ 2/9 Scores 04/19/2017 04/12/2017 05/03/2014 03/12/2013  PHQ - 2 Score 0 0 0 0      Assessment: Patient's medications were reviewed in the home.    Adherence is still a huge issue for the patient.   Several medications were left out of the weekly pill box:  Januvia, Jardiance, Atorvastatin and Amlodipine were all missing from the patient's filled box.  Eliquis was only in the box for the AM but not the PM  The Pill Box was emptied and refilled with the patient's caregiver. Another medication list was printed for the patient that groups the medications by  the time of day they are taken.  In addition, the patient's medication bottles were placed in colored baskets based on if they were morning, evening, or multiple time per day medications to help with confusion.  Medication Assistance:  Patient's medication assistance forms for Eliquis were faxed to [name].   Plan: Call patient back in 1 week to check on his Ranxea application and Eliquis application Schedule a home visit at next call  Beecher McardleKatina J. Bravlio Luca, PharmD, [facility] Center[facility]ACP [facility] Clinical Pharmacist [phone]

## 2017-05-10 ENCOUNTER — Other Ambulatory Visit: Payer: Self-pay

## 2017-05-10 NOTE — Patient Outreach (Signed)
[facility]N) Care Management  05/10/17  Samuel [name]  1951/04/18 [id number]  Successful outreach completed with patient. Patient identification verified.  Subjective: Patient stated that "Things have been going pretty good."   Objective: Blood pressure 118/78, pulse 83, SpO2 96 % (taken by patient with his telemonitoring equipment)  He stated that his physical therapist was there today and he did 6 laps which was better than what he normally does. He was very excited about this. He stated that his PT will be back on Tuesday and then his supervisor will be coming to evaluate his progress and see how much extension of time he will need.   Patient stated that his telemonitoring with [name] is going well he thinks, except for one day when they did not receive his reading. He stated that he had done everything he was supposed to do that morning, but they called him later that afternoon around 6 pm saying that they had not received a reading for that day. He stated that they had him unplug the unit for a few minutes and plug it back in to reset it and it should send the results. He stated that he is unsure what his weights have been because he cannot bend down to read the scales. He stated that he guesses it has been ok because no one has called and told him otherwise. He stated that his blood pressure and oxygen levels have been ok. He reported that his blood pressure this morning was 118/78 and has been real good like that since he started checking it. He stated that his pulse was 83 and his oxygen level was 93%. He stated that he took a few breaths and it came up to 96%. RNCM educated patient on the importance of taking deep breaths and doing deep breathing exercises and patient verbalized understanding. His caregiver, Sedalia MutaDiane was also on the phone and stated that she reminds him of that every day.  Patient denies any chest pain or shortness of breath. He currently has no needs  or concerns.   RNCM will follow up with patient telephonically next week and home visit was scheduled for 2 weeks. RNCM encouraged patient to call before if he has any questions or concerns and he verbalized understanding.  TurkeyVictoria R. Paiden Caraveo, RN, BSN, CCM [facility] Care Management Coordinator [phone]

## 2017-05-11 DIAGNOSIS — F329 Major depressive disorder, single episode, unspecified: Secondary | ICD-10-CM | POA: Diagnosis not present

## 2017-05-11 DIAGNOSIS — E1143 Type 2 diabetes mellitus with diabetic autonomic (poly)neuropathy: Secondary | ICD-10-CM | POA: Diagnosis not present

## 2017-05-11 DIAGNOSIS — E1169 Type 2 diabetes mellitus with other specified complication: Secondary | ICD-10-CM | POA: Diagnosis not present

## 2017-05-11 DIAGNOSIS — E1142 Type 2 diabetes mellitus with diabetic polyneuropathy: Secondary | ICD-10-CM | POA: Diagnosis not present

## 2017-05-11 DIAGNOSIS — E1165 Type 2 diabetes mellitus with hyperglycemia: Secondary | ICD-10-CM | POA: Diagnosis not present

## 2017-05-11 DIAGNOSIS — L97411 Non-pressure chronic ulcer of right heel and midfoot limited to breakdown of skin: Secondary | ICD-10-CM | POA: Diagnosis not present

## 2017-05-11 DIAGNOSIS — J449 Chronic obstructive pulmonary disease, unspecified: Secondary | ICD-10-CM | POA: Diagnosis not present

## 2017-05-11 DIAGNOSIS — I214 Non-ST elevation (NSTEMI) myocardial infarction: Secondary | ICD-10-CM | POA: Diagnosis not present

## 2017-05-11 DIAGNOSIS — I2510 Atherosclerotic heart disease of native coronary artery without angina pectoris: Secondary | ICD-10-CM | POA: Diagnosis not present

## 2017-05-11 DIAGNOSIS — E1151 Type 2 diabetes mellitus with diabetic peripheral angiopathy without gangrene: Secondary | ICD-10-CM | POA: Diagnosis not present

## 2017-05-11 DIAGNOSIS — I10 Essential (primary) hypertension: Secondary | ICD-10-CM | POA: Diagnosis not present

## 2017-05-11 DIAGNOSIS — E11621 Type 2 diabetes mellitus with foot ulcer: Secondary | ICD-10-CM | POA: Diagnosis not present

## 2017-05-11 DIAGNOSIS — K3184 Gastroparesis: Secondary | ICD-10-CM | POA: Diagnosis not present

## 2017-05-11 DIAGNOSIS — I252 Old myocardial infarction: Secondary | ICD-10-CM | POA: Diagnosis not present

## 2017-05-15 ENCOUNTER — Ambulatory Visit: Payer: Self-pay

## 2017-05-15 ENCOUNTER — Other Ambulatory Visit: Payer: Self-pay | Admitting: Pharmacist

## 2017-05-15 NOTE — Patient Outreach (Signed)
[facility]N) Care Management  05/15/2017  Samuel BusingJohn D [name] [birth year] [id number]   Patient was called to follow up on medication assistance. HIPAA identifiers were obtained. Both [name] and [employer] were called on the patient's behalf.  The representative at [name] confirmed they received the application for [name] but stated the patient consent form page was not dated.  The patient signed the form on 05/01/17 and actual signed and dated form was not sent so the form was re-sent to [name].   The [employer] representative said the patient was denied for their program because his copay was only $45 for a 30 day supply and $90 for a 90 day supply.  (Meaning they require higher copays).  The status of both applications was provided to the patient.   Plan:  Follow up on the Eliquis application in 5-7 business days.  Beecher McardleKatina J. Latronda Spink, PharmD, BCACP [facility] Clinical Pharmacist [phone]

## 2017-05-16 ENCOUNTER — Ambulatory Visit: Payer: Self-pay | Admitting: Pharmacist

## 2017-05-18 ENCOUNTER — Other Ambulatory Visit: Payer: Self-pay

## 2017-05-18 NOTE — Patient Outreach (Signed)
[facility]) Care Management  05/18/17  Samuel BusingJohn D [name] Apr 27, 1951 [id number]   Successful outreach completed with patient. Patient identification verified.  Patient stated that he has been having "several good days." He reported that he has a bad place on his right leg. He reported that he had two blisters that opened and started draining. He reported that his home heath nurse with [name] cleaned them and put bandages on them. She will be back out to see patient on Monday.   Patient reported that he has an appointment with his PCP on December 13.   He continued to state that "Everything is going ok." Patient was easily distracted on the phone, stating that his cousin and his wife are visiting and they are watching football.   Plan: Home visit is scheduled for next week for follow up.   TurkeyVictoria R. Angelyna Henderson, RN, BSN, CCM [facility] Care Management Coordinator [phone]

## 2017-05-19 ENCOUNTER — Other Ambulatory Visit: Payer: Self-pay

## 2017-05-19 ENCOUNTER — Emergency Department (HOSPITAL_COMMUNITY): Payer: PPO

## 2017-05-19 ENCOUNTER — Emergency Department (HOSPITAL_COMMUNITY)
Admission: EM | Admit: 2017-05-19 | Discharge: 2017-05-19 | Disposition: A | Discharge status: Home / Self Care | Payer: PPO | Attending: Emergency Medicine | Admitting: Emergency Medicine

## 2017-05-19 ENCOUNTER — Encounter (HOSPITAL_COMMUNITY): Payer: Self-pay

## 2017-05-19 DIAGNOSIS — E1143 Type 2 diabetes mellitus with diabetic autonomic (poly)neuropathy: Secondary | ICD-10-CM | POA: Diagnosis not present

## 2017-05-19 DIAGNOSIS — Z7901 Long term (current) use of anticoagulants: Secondary | ICD-10-CM | POA: Diagnosis not present

## 2017-05-19 DIAGNOSIS — I10 Essential (primary) hypertension: Secondary | ICD-10-CM | POA: Diagnosis not present

## 2017-05-19 DIAGNOSIS — J449 Chronic obstructive pulmonary disease, unspecified: Secondary | ICD-10-CM | POA: Diagnosis not present

## 2017-05-19 DIAGNOSIS — Z7982 Long term (current) use of aspirin: Secondary | ICD-10-CM | POA: Diagnosis not present

## 2017-05-19 DIAGNOSIS — Z794 Long term (current) use of insulin: Secondary | ICD-10-CM | POA: Diagnosis not present

## 2017-05-19 DIAGNOSIS — I2510 Atherosclerotic heart disease of native coronary artery without angina pectoris: Secondary | ICD-10-CM | POA: Insufficient documentation

## 2017-05-19 DIAGNOSIS — R1110 Vomiting, unspecified: Secondary | ICD-10-CM | POA: Diagnosis present

## 2017-05-19 DIAGNOSIS — Z7902 Long term (current) use of antithrombotics/antiplatelets: Secondary | ICD-10-CM | POA: Insufficient documentation

## 2017-05-19 DIAGNOSIS — K3184 Gastroparesis: Secondary | ICD-10-CM | POA: Insufficient documentation

## 2017-05-19 DIAGNOSIS — R112 Nausea with vomiting, unspecified: Secondary | ICD-10-CM | POA: Diagnosis not present

## 2017-05-19 DIAGNOSIS — R9431 Abnormal electrocardiogram [ECG] [EKG]: Secondary | ICD-10-CM | POA: Diagnosis not present

## 2017-05-19 DIAGNOSIS — I252 Old myocardial infarction: Secondary | ICD-10-CM | POA: Insufficient documentation

## 2017-05-19 DIAGNOSIS — Z955 Presence of coronary angioplasty implant and graft: Secondary | ICD-10-CM | POA: Diagnosis not present

## 2017-05-19 DIAGNOSIS — Z79899 Other long term (current) drug therapy: Secondary | ICD-10-CM | POA: Diagnosis not present

## 2017-05-19 DIAGNOSIS — R079 Chest pain, unspecified: Secondary | ICD-10-CM | POA: Diagnosis not present

## 2017-05-19 DIAGNOSIS — E1043 Type 1 diabetes mellitus with diabetic autonomic (poly)neuropathy: Secondary | ICD-10-CM | POA: Diagnosis not present

## 2017-05-19 HISTORY — DX: Acute myocardial infarction, unspecified: I21.9

## 2017-05-19 LAB — COMPREHENSIVE METABOLIC PANEL
ALBUMIN: 3.5 g/dL (ref 3.5–5.0)
ALK PHOS: 64 U/L (ref 38–126)
ALT: 13 U/L — ABNORMAL LOW (ref 17–63)
ANION GAP: 11 (ref 5–15)
AST: 18 U/L (ref 15–41)
BUN: 17 mg/dL (ref 6–20)
CO2: 24 mmol/L (ref 22–32)
Calcium: 9.1 mg/dL (ref 8.9–10.3)
Chloride: 100 mmol/L — ABNORMAL LOW (ref 101–111)
Creatinine, Ser: 1.21 mg/dL (ref 0.61–1.24)
GFR calc Af Amer: >60 mL/min (ref >60)
GFR calc non Af Amer: >60 mL/min (ref >60)
GLUCOSE: 156 mg/dL — AB (ref 65–99)
POTASSIUM: 3.8 mmol/L (ref 3.5–5.1)
SODIUM: 135 mmol/L (ref 135–145)
Total Bilirubin: 1.1 mg/dL (ref 0.3–1.2)
Total Protein: 7.2 g/dL (ref 6.5–8.1)

## 2017-05-19 LAB — CBC
HEMATOCRIT: 40.7 % (ref 39.0–52.0)
HEMOGLOBIN: 14 g/dL (ref 13.0–17.0)
MCH: 31.3 pg (ref 26.0–34.0)
MCHC: 34.4 g/dL (ref 30.0–36.0)
MCV: 91.1 fL (ref 78.0–100.0)
Platelets: 227 10*3/uL (ref 150–400)
RBC: 4.47 MIL/uL (ref 4.22–5.81)
RDW: 13.3 % (ref 11.5–15.5)
WBC: 7 10*3/uL (ref 4.0–10.5)

## 2017-05-19 LAB — URINALYSIS, ROUTINE W REFLEX MICROSCOPIC
BACTERIA UA: NONE SEEN
BILIRUBIN URINE: NEGATIVE
Glucose, UA: >=500 mg/dL — AB
Hgb urine dipstick: NEGATIVE
Ketones, ur: 80 mg/dL — AB
Leukocytes, UA: NEGATIVE
Nitrite: NEGATIVE
Protein, ur: NEGATIVE mg/dL
SPECIFIC GRAVITY, URINE: 1.027 (ref 1.005–1.030)
SQUAMOUS EPITHELIAL / LPF: NONE SEEN
pH: 5 (ref 5.0–8.0)

## 2017-05-19 LAB — CBG MONITORING, ED: GLUCOSE-CAPILLARY: 157 mg/dL — AB (ref 65–99)

## 2017-05-19 LAB — I-STAT TROPONIN, ED: TROPONIN I, POC: 0 ng/mL (ref 0.00–0.08)

## 2017-05-19 LAB — LIPASE, BLOOD: Lipase: 17 U/L (ref 11–51)

## 2017-05-19 MED ORDER — SODIUM CHLORIDE 0.9 % IV BOLUS (SEPSIS): 1000.0000 mL | Freq: Once | INTRAVENOUS | Status: DC

## 2017-05-19 MED ORDER — PROMETHAZINE HCL 25 MG/ML IJ SOLN
25.0000 mg | Freq: Once | INTRAMUSCULAR | Status: AC
  Administered 2017-05-19: 25 mg via INTRAVENOUS
  Filled 2017-05-19: qty 1

## 2017-05-19 NOTE — Discharge Instructions (Signed)
Contact a health care provider if: You have a fever. You are not able to keep fluids down. Your vomiting gets worse. You have new symptoms. You feel light-headed or dizzy. You have a headache. You have muscle cramps. Get help right away if: You have pain in your chest, neck, arm, or jaw. You feel extremely weak or you faint. You have persistent vomiting. You have vomit that is bright red or looks like black coffee grounds. You have stools that are bloody or black, or stools that look like tar. You have severe pain, cramping, or bloating in your abdomen. You have a severe headache, a stiff neck, or both. You have a rash. You have trouble breathing or you are breathing very quickly. Your heart is beating very quickly. Your skin feels cold and clammy. You feel confused. You have pain while urinating. You have signs of dehydration, such as: Dark urine, or very little or no urine. Cracked lips. Dry mouth. Sunken eyes. Sleepiness. Weakness.

## 2017-05-19 NOTE — ED Provider Notes (Signed)
[facility]-EMERGENCY DEPT Provider Note   CSN: [id number] Arrival date & time: 05/19/17  1432     History   Chief Complaint Chief Complaint  Patient presents with  . Emesis    Pt has gastroparesis    HPI Samuel [name] is a 66 y.o. male.  Medical history of CAD status post MI about 1 month ago.  He has a history of chronic gastroparesis.  The patient states about 8:30 AM he began having vomiting.  It was intractable and he cannot control it with his normal home medications of either Compazine or Phenergan.  This prompted his visit to the emergency department.  He states it feels exactly like his other gastroparesis exacerbations.  Although the triage note says he has chest pain he denies any current chest pain.  Denies shortness of breath, diaphoresis, urinary symptoms, fever or chills.  HPI  Past Medical History:  Diagnosis Date  . Anxiety   . Arthritis    "all over"   . CAD (coronary artery disease)   . Charcot's joint    "left foot"  . 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 Depression   . Gastroparesis   . GERD (gastroesophageal reflux disease)   . H/O hiatal hernia   . Hyperlipidemia   . Hypertension   . Myocardial infarction (HCC) 2017/03/27   around this date  . OSA (obstructive sleep apnea)    "not bad enough for a mask"  . Peripheral neuropathy   . Peripheral vascular disease (HCC)   . PONV (postoperative nausea and vomiting)   . Pulmonary embolism (HCC)    hx. of 2012  . Shortness of breath    exertion  . Type II diabetes mellitus [facility])     Patient Active Problem List   Diagnosis Date Noted  . S/P BKA (below knee amputation) (HCC) 04/19/2017  . COPD (chronic obstructive pulmonary disease) (HCC) 04/19/2017  . NSTEMI (non-ST elevated myocardial infarction) (HCC) 04/07/2017  . Osteomyelitis of foot (HCC)   . Protein-calorie malnutrition, severe (HCC) 03/08/2015  . Osteomyelitis of foot, acute (HCC) 03/07/2015  .  Sepsis (HCC) 03/07/2015  . Diabetes mellitus type 2, uncontrolled (HCC) 03/07/2015  . Normocytic normochromic anemia 03/07/2015  . Obesity (BMI 30-39.9) 03/07/2015  . Nausea & vomiting   . DM (diabetes mellitus), type 2 (HCC) 01/07/2014  . Chronic osteomyelitis involving lower leg (HCC) 01/07/2014  . Vomiting 12/22/2013  . Gastroparesis 12/21/2013  . Intractable nausea and vomiting 12/21/2013  . Essential hypertension 05/01/2013  . Hyperlipidemia due to type 2 diabetes mellitus (HCC) 05/01/2013  . Generalized weakness 03/20/2013  . Back pain 03/20/2013  . Pulmonary embolism (HCC) 03/27/2011  . PERFORATION OF GALLBLADDER 08/30/2010  . Ulcer of lower limb, unspecified 03/01/2010  . METHICILLIN SUSCEPTIBLE STAPH AUREUS SEPTICEMIA 01/25/2010  . Acute osteomyelitis, ankle and foot 01/25/2010  . NAUSEA AND VOMITING 01/25/2010  . GERD 11/09/2009  . CAD S/P DES PCI to mLAD: Xience DES 2.75 x 15 (3.0 mm) 05/01/2009  . DIABETES, TYPE 1 05/14/2008  . OBSTRUCTIVE SLEEP APNEA 04/30/2008  . ALLERGIC RHINITIS, SEASONAL 04/30/2008    Past Surgical History:  Procedure Laterality Date  . AMPUTATION Left 03/08/2015   Procedure:  LEFT AMPUTATION BELOW KNEE;  Surgeon: Toni Arthurs, MD;  Location: [facility] ORS;  Service: Orthopedics;  Laterality: Left;  . APPLICATION OF WOUND VAC Left 01/07/2014  . CHOLECYSTECTOMY  06/2010  . CORONARY ANGIOPLASTY WITH STENT PLACEMENT  05/2009   "1"  .  FOOT SURGERY Left 2010   "for Charcot's joint"  . HARDWARE REMOVAL Left 01/07/2014   Procedure: LEFT LEG REMOVAL OF DEEP IMPLANT AND SEQUESTRECTOMY; APPLICATION OF WOUND VAC ;  Surgeon: Toni ArthursJohn Hewitt, MD;  Location: [facility] OR;  Service: Orthopedics;  Laterality: Left;  . I&D EXTREMITY Left "multiple"   leg  . I&D EXTREMITY Left 01/07/2014   Procedure: 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;  Surgeon: Toni ArthursJohn Hewitt, MD;  Location: [facility] OR;  Service: Orthopedics;  Laterality: Left;  . IM NAILING TIBIA Left ~ 2012  . LEFT  HEART CATH AND CORONARY ANGIOGRAPHY N/A 04/08/2017   Procedure: LEFT HEART CATH AND CORONARY ANGIOGRAPHY;  Surgeon: Runell GessBerry, Jonathan J, MD;  Location: [facility] INVASIVE CV LAB;  Service: Cardiovascular;  Laterality: N/A;  . SHOULDER ARTHROSCOPY W/ ROTATOR CUFF REPAIR Left    "and bone spurs"  . TIBIAL IM ROD REMOVAL Left 01/07/2014  . TOE AMPUTATION Right ~ 2011   "great toe"  . VENA CAVA FILTER PLACEMENT  2012  . VENA CAVA FILTER PLACEMENT N/A 11/20/2016   Procedure: INSERTION VENA-CAVA FILTER;  Surgeon: Sherren KernsFields, Charles E, MD;  [facility];  Service: Vascular;  Laterality: N/A;  . WOUND DEBRIDEMENT Left 01/07/2014   "tibia"       Home Medications    Prior to Admission medications   Medication Sig Start Date End Date Taking? Authorizing Provider  acarbose (PRECOSE) 50 MG tablet Take 50 mg 3 (three) times daily with meals by mouth.    Pearson GrippeKim, James, MD  amLODipine (NORVASC) 10 MG tablet Take 1 tablet (10 mg total) by mouth daily. 11/22/16   Richarda OverlieAbrol, Nayana, MD  apixaban (ELIQUIS) 5 MG TABS tablet Take 1 tablet (5 mg total) by mouth 2 (two) times daily. 04/11/17 04/11/18  Glade LloydAlekh, Kshitiz, MD  aspirin 81 MG EC tablet Take 1 tablet (81 mg total) by mouth daily. 04/12/17   Glade LloydAlekh, Kshitiz, MD  atorvastatin (LIPITOR) 80 MG tablet Take 1 tablet (80 mg total) by mouth daily at 6 PM. 04/11/17   Glade LloydAlekh, Kshitiz, MD  Canagliflozin (INVOKANA) 100 MG TABS Take 100 mg by mouth See admin instructions. invokana 100 mg daily. Alternating every other week between Jardiance and Invokana    [provider]  clopidogrel (PLAVIX) 75 MG tablet Take 1 tablet (75 mg total) by mouth daily. 04/12/17   Glade LloydAlekh, Kshitiz, MD  diphenhydramine-acetaminophen (TYLENOL PM) 25-500 MG TABS tablet Take 1 tablet by mouth at bedtime.    [provider]  docusate sodium (COLACE) 100 MG capsule Take 100 mg by mouth daily as needed (for constipation).     [provider]  empagliflozin (JARDIANCE) 25 MG TABS tablet Take  10 mg See admin instructions by mouth. Alternating every other week between Jardiance and Invokana    [provider]  ferrous sulfate 325 (65 FE) MG tablet Take 325 mg by mouth every evening.     [provider]  furosemide (LASIX) 20 MG tablet Take 1 tablet (20 mg total) by mouth daily. 04/12/17   Glade LloydAlekh, Kshitiz, MD  gabapentin (NEURONTIN) 300 MG capsule Take 300 mg by mouth 3 (three) times daily.    [provider]  ibuprofen (ADVIL,MOTRIN) 200 MG tablet Take 400 mg every 6 (six) hours as needed by mouth for headache or pain.    [provider]  insulin detemir (LEVEMIR) 100 UNIT/ML injection Inject 85 Units into the skin 2 (two) times daily.     [provider]  insulin lispro (HUMALOG)  100 UNIT/ML injection Inject 0-10 Units into the skin 3 (three) times daily with meals. Inject SQ per sliding scale 150-250 (5 units), 251-350(8 units), 351-450(10 units), >450=10 units    [provider]  lansoprazole (PREVACID) 30 MG capsule Take 30 mg by mouth 2 (two) times daily. 03/19/17   [provider]  metoCLOPramide (REGLAN) 5 MG tablet Take 10 mg by mouth at bedtime.     [provider]  metoprolol tartrate (LOPRESSOR) 25 MG tablet Take 1 tablet (25 mg total) by mouth 2 (two) times daily. 04/11/17   Glade Lloyd, MD  nitroGLYCERIN (NITROSTAT) 0.4 MG SL tablet Place 1 tablet (0.4 mg total) under the tongue every 5 (five) minutes as needed for chest pain. 04/11/17   Glade Lloyd, MD  pioglitazone (ACTOS) 30 MG tablet Take 30 mg by mouth at bedtime.     [provider]  polyethylene glycol (MIRALAX / GLYCOLAX) packet Take 17 g by mouth daily as needed for moderate constipation. 04/11/17   Glade Lloyd, MD  prochlorperazine (COMPAZINE) 10 MG tablet Take 10 mg by mouth daily as needed for nausea/vomiting. 04/01/17   [provider]  promethazine (PHENERGAN) 25 MG tablet Take 25 mg by mouth every 8 (eight) hours as  needed for nausea or vomiting. Take one tablet by mouth every 8 hours as needed nausea/vomiting    [provider]  ranitidine (ZANTAC) 150 MG tablet Take 150 mg by mouth at bedtime.     [provider]  ranolazine (RANEXA) 500 MG 12 hr tablet Take 1 tablet (500 mg total) by mouth 2 (two) times daily. 04/11/17   Glade Lloyd, MD  sitaGLIPtin (JANUVIA) 100 MG tablet Take 100 mg daily by mouth.    [name], MD  traMADol (ULTRAM) 50 MG tablet Take 100 mg by mouth every 12 (twelve) hours as needed (pain).    [provider]    Family History Family History  Problem Relation Age of Onset  . COPD Mother   . Heart disease Mother   . Lung cancer Mother   . Retinal detachment Father   . Alcoholism Brother   . Heart disease Brother   . COPD Brother   . Diabetes Brother   . Hypertension Brother   . Stroke Brother     Social History Social History   Tobacco Use  . Smoking status: Never Smoker  . Smokeless tobacco: Never Used  Substance Use Topics  . Alcohol use: No    Frequency: Never    Comment: 01/07/2014 'might have a wine cooler a couple times/yr"  . Drug use: No     Allergies   Gabapentin; Nabumetone; and Codeine   Review of Systems Review of Systems Ten systems reviewed and are negative for acute change, except as noted in the HPI.    Physical Exam Updated Vital Signs BP 135/81   Pulse (!) 101   Temp (!) 97.5 F (36.4 C) (Oral)   Resp (!) 25   Ht 6\' 3"  (1.905 m)   Wt 134.7 kg (297 lb)   SpO2 100%   BMI 37.12 kg/m   Physical Exam  Constitutional: He appears well-developed and well-nourished. No distress.  HENT:  Head: Normocephalic and atraumatic.  Eyes: Conjunctivae are normal. No scleral icterus.  Neck: Normal range of motion. Neck supple.  Cardiovascular: Normal rate, regular rhythm and normal heart sounds.  Pulmonary/Chest: Effort normal and breath sounds normal. No respiratory distress. He has no wheezes.  Abdominal: Soft.  He exhibits  no distension and no mass. There is no tenderness. There is no rebound and no guarding.  Musculoskeletal: He exhibits no edema.  Neurological: He is alert.  Skin: Skin is warm and dry. He is not diaphoretic.  Psychiatric: His behavior is normal.  Nursing note and vitals reviewed.    ED Treatments / Results  Labs (all labs ordered are listed, but only abnormal results are displayed) Labs Reviewed  COMPREHENSIVE METABOLIC PANEL - Abnormal; Notable for the following components:      Result Value   Chloride 100 (*)    Glucose, Bld 156 (*)    ALT 13 (*)    All other components within normal limits  URINALYSIS, ROUTINE W REFLEX MICROSCOPIC - Abnormal; Notable for the following components:   Glucose, UA >=500 (*)    Ketones, ur 80 (*)    All other components within normal limits  CBG MONITORING, ED - Abnormal; Notable for the following components:   Glucose-Capillary 157 (*)    All other components within normal limits  LIPASE, BLOOD  CBC  I-STAT TROPONIN, ED    EKG  EKG Interpretation None       Radiology Dg Chest Port 1 View  Result Date: 05/19/2017 CLINICAL DATA:  Acute vomiting and chest pain today. EXAM: PORTABLE CHEST 1 VIEW COMPARISON:  04/07/2017 and prior radiographs FINDINGS: The cardiomediastinal silhouette is unremarkable. This is a mildly low volume film with mild bibasilar atelectasis. There is no evidence of focal airspace disease, pulmonary edema, suspicious pulmonary nodule/mass, pleural effusion, or pneumothorax. No acute bony abnormalities are identified. IMPRESSION: Low volume film with mild bibasilar atelectasis. Electronically Signed   By: Harmon PierJeffrey  Hu M.D.   On: 05/19/2017 16:24    Procedures Procedures (including critical care time)  Medications Ordered in ED Medications  promethazine (PHENERGAN) injection 25 mg (25 mg Intravenous Given 05/19/17 1606)     Initial Impression / Assessment and Plan / ED Course  I have reviewed the triage  vital signs and the nursing notes.  Pertinent labs & imaging results that were available during my care of the patient were reviewed by me and considered in my medical decision making (see chart for details).     Patient has received 1 dose of IV Phenergan.  He states that his nausea is back to baseline.  He has held down about 12 ounces of diet ginger ale.  He feels greatly improved and would like to be discharged at this time.  I have reviewed his labs and he does not have any emergent values.  He has some mild hyper glycemia and some ketones in his urine.  No anion gap or signs of metabolic acidosis.  Chest x-ray without significant abnormality. Ekg without signs if ischemia. Appears safe for d.c. Discussed return precautions.  Final Clinical Impressions(s) / ED Diagnoses   Final diagnoses:  Gastroparesis due to DM [facility](HCC)    ED Discharge Orders    None       Arthor CaptainHarris, Delando Satter, PA-C 05/19/17 1941    Bethann BerkshireZammit, Joseph, MD 05/20/17 [phone]

## 2017-05-19 NOTE — ED Notes (Signed)
Bed: WA01 Expected date:  Expected time:  Means of arrival:  Comments: 66 yo N/V

## 2017-05-19 NOTE — ED Triage Notes (Signed)
Pt states he has been having Vomiting for the past 7 hours r/t current diagnosis of Gastroparesis. Pt denies having loose stools, painful urination, chest pain and SOB.

## 2017-05-20 ENCOUNTER — Other Ambulatory Visit: Payer: Self-pay

## 2017-05-20 DIAGNOSIS — L97411 Non-pressure chronic ulcer of right heel and midfoot limited to breakdown of skin: Secondary | ICD-10-CM | POA: Diagnosis not present

## 2017-05-20 DIAGNOSIS — E11621 Type 2 diabetes mellitus with foot ulcer: Secondary | ICD-10-CM | POA: Diagnosis not present

## 2017-05-20 DIAGNOSIS — E1165 Type 2 diabetes mellitus with hyperglycemia: Secondary | ICD-10-CM | POA: Diagnosis not present

## 2017-05-20 DIAGNOSIS — I2510 Atherosclerotic heart disease of native coronary artery without angina pectoris: Secondary | ICD-10-CM | POA: Diagnosis not present

## 2017-05-20 DIAGNOSIS — K3184 Gastroparesis: Secondary | ICD-10-CM | POA: Diagnosis not present

## 2017-05-20 DIAGNOSIS — J449 Chronic obstructive pulmonary disease, unspecified: Secondary | ICD-10-CM | POA: Diagnosis not present

## 2017-05-20 DIAGNOSIS — I252 Old myocardial infarction: Secondary | ICD-10-CM | POA: Diagnosis not present

## 2017-05-20 DIAGNOSIS — F329 Major depressive disorder, single episode, unspecified: Secondary | ICD-10-CM | POA: Diagnosis not present

## 2017-05-20 DIAGNOSIS — E1143 Type 2 diabetes mellitus with diabetic autonomic (poly)neuropathy: Secondary | ICD-10-CM | POA: Diagnosis not present

## 2017-05-20 DIAGNOSIS — E1169 Type 2 diabetes mellitus with other specified complication: Secondary | ICD-10-CM | POA: Diagnosis not present

## 2017-05-20 DIAGNOSIS — I214 Non-ST elevation (NSTEMI) myocardial infarction: Secondary | ICD-10-CM | POA: Diagnosis not present

## 2017-05-20 DIAGNOSIS — I10 Essential (primary) hypertension: Secondary | ICD-10-CM | POA: Diagnosis not present

## 2017-05-20 DIAGNOSIS — E1151 Type 2 diabetes mellitus with diabetic peripheral angiopathy without gangrene: Secondary | ICD-10-CM | POA: Diagnosis not present

## 2017-05-20 DIAGNOSIS — E1142 Type 2 diabetes mellitus with diabetic polyneuropathy: Secondary | ICD-10-CM | POA: Diagnosis not present

## 2017-05-20 NOTE — Patient Outreach (Signed)
[facility] Care [facility]  05/20/17  Samuel BusingJohn D [name] 05-04-1951 [id number]  Collaboration with [name]  Successful outreach completed with Samuel HughsKecia, [facility]. Advised calling to determine if they have wound care orders for patient and if they are providing wound care supplies for patient.   Samuel HughsKecia stated that they are providing wound care and order any supplies that are needed for their patients related to wound care. She stated that a nurse is scheduled to go out today to change his dressing and provide any supplies that are needed.  TurkeyVictoria R. Adriana Quinby, RN, BSN, CCM [facility] Care Management Coordinator [phone]

## 2017-05-20 NOTE — Patient Outreach (Signed)
[facility]) Care Management  05/20/17  Samuel BusingJohn D Hamm 1951-02-24 [id number]  RNCM attempted to reach patient. Patient's friend, [name] answered the phone. She stated that patient had to go to the emergency department yesterday due to gastroparesis and he is nauseated this morning. She tried to give patient the phone, but he stated that he does not feel like talking. She did state that is home health nurse, Kriste BasqueBecky had already come out this morning to see him and changed his dressings. She stated she left some dressing supplies in case he needs them, but she is concerned it is not enough. RNCM advised that they can call his home health nurse for any concerns related to his wound care/supply needs. Provided the contact information for [name].   Plan: RNCM has a home visit scheduled with patient for later this week and will follow up regarding his nausea and wounds.  TurkeyVictoria R. Ailani Governale, RN, BSN, CCM [facility] Care Management Coordinator [phone]

## 2017-05-23 ENCOUNTER — Other Ambulatory Visit: Payer: Self-pay

## 2017-05-23 ENCOUNTER — Other Ambulatory Visit: Payer: Self-pay | Admitting: Pharmacist

## 2017-05-23 NOTE — Patient Outreach (Signed)
[facility]) Care Management  05/23/2017  Samuel [name] Aug 17, 1950 [id number]   Patient was called to follow up on medication assistance with Eliquis. HIPAA identifiers were obtained. [name] patient assistance program was called on the patient's behalf today. The representative said they needed to clarify to patient's household size. The patient's household size of "1" was confirmed with the representative who said she would send the patient's application to a processor.  Patient was informed of the status.  Plan: Follow up within 3 business days.   Samuel [name], PharmD, BCACP [facility] Clinical Pharmacist [phone]

## 2017-05-23 NOTE — Patient Outreach (Signed)
[facility]) Care Management  [facility] Care Manager  05/23/2017   Samuel [name] Nov 20, 1950 [id number]   Home visit completed today with patient and his caregiver, [name].  Subjective: Patient stated that he is still having a little bit of nausea today.   Objective: Blood pressure 118/62, pulse 75, weight 287 lb (130.2 kg), SpO2 97 %. Lung sound clear to auscultation bilaterally. Respirations even and unlabored.  Heart sounds normal, with normal rate and rhythm.  Encounter Medications:  Outpatient Encounter Medications as of 05/23/2017  Medication Sig Note  . acarbose (PRECOSE) 50 MG tablet Take 50 mg 3 (three) times daily with meals by mouth.   [name] amLODipine (NORVASC) 10 MG tablet Take 1 tablet (10 mg total) by mouth daily.   [name] apixaban (ELIQUIS) 5 MG TABS tablet Take 1 tablet (5 mg total) by mouth 2 (two) times daily.   [name] aspirin 81 MG EC tablet Take 1 tablet (81 mg total) by mouth daily.   [name] atorvastatin (LIPITOR) 80 MG tablet Take 1 tablet (80 mg total) by mouth daily at 6 PM.   . Canagliflozin (INVOKANA) 100 MG TABS Take 100 mg by mouth See admin instructions. invokana 100 mg daily. Alternating every other week between Jardiance and Invokana 04/26/2017: Invokana 300mg  in the home. Patient alternates between [place] and Jardiance depending on which samples he has.  . clopidogrel (PLAVIX) 75 MG tablet Take 1 tablet (75 mg total) by mouth daily.   . diphenhydramine-acetaminophen (TYLENOL PM) 25-500 MG TABS tablet Take 1 tablet by mouth at bedtime. 04/23/2017: TAKE AS NEEDED  . docusate sodium (COLACE) 100 MG capsule Take 100 mg by mouth daily as needed (for constipation).    [name] empagliflozin (JARDIANCE) 25 MG TABS tablet Take 10 mg See admin instructions by mouth. Alternating every other week between [place]   . ferrous sulfate 325 (65 FE) MG tablet Take 325 mg by mouth every evening.    . furosemide (LASIX) 20 MG tablet Take 1 tablet (20 mg total) by mouth  daily.   [name] gabapentin (NEURONTIN) 300 MG capsule Take 300 mg by mouth 3 (three) times daily. 10/06/2014: .  . ibuprofen (ADVIL,MOTRIN) 200 MG tablet Take 400 mg every 6 (six) hours as needed by mouth for headache or pain.   [name] insulin detemir (LEVEMIR) 100 UNIT/ML injection Inject 85 Units into the skin 2 (two) times daily.  04/18/2017: Patient is now taking 87 units  . insulin lispro (HUMALOG) 100 UNIT/ML injection Inject 0-10 Units into the skin 3 (three) times daily with meals. Inject SQ per sliding scale 150-250 (5 units), 251-350(8 units), 351-450(10 units), >450=10 units   . lansoprazole (PREVACID) 30 MG capsule Take 30 mg by mouth 2 (two) times daily.   . metoCLOPramide (REGLAN) 5 MG tablet Take 10 mg by mouth at bedtime.  04/26/2017: PRN   . metoprolol tartrate (LOPRESSOR) 25 MG tablet Take 1 tablet (25 mg total) by mouth 2 (two) times daily.   . nitroGLYCERIN (NITROSTAT) 0.4 MG SL tablet Place 1 tablet (0.4 mg total) under the tongue every 5 (five) minutes as needed for chest pain.   . pioglitazone (ACTOS) 30 MG tablet Take 30 mg by mouth at bedtime.    . polyethylene glycol (MIRALAX / GLYCOLAX) packet Take 17 g by mouth daily as needed for moderate constipation.   . prochlorperazine (COMPAZINE) 10 MG tablet Take 10 mg by mouth daily as needed for nausea/vomiting.   . promethazine (PHENERGAN) 25 MG tablet Take 25  mg by mouth every 8 (eight) hours as needed for nausea or vomiting. Take one tablet by mouth every 8 hours as needed nausea/vomiting   . ranitidine (ZANTAC) 150 MG tablet Take 150 mg by mouth at bedtime.    . ranolazine (RANEXA) 500 MG 12 hr tablet Take 1 tablet (500 mg total) by mouth 2 (two) times daily.   . sitaGLIPtin (JANUVIA) 100 MG tablet Take 100 mg daily by mouth.   . traMADol (ULTRAM) 50 MG tablet Take 100 mg by mouth every 12 (twelve) hours as needed (pain).    No facility-administered encounter medications on file as of 05/23/2017.     Functional Status:  In your  present state of health, do you have any difficulty performing the following activities: 04/19/2017 04/07/2017  Hearing? N N  Vision? N N  Difficulty concentrating or making decisions? N N  Walking or climbing stairs? Y N  Comment - -  Dressing or bathing? N N  Doing errands, shopping? N N  Preparing Food and eating ? N -  Using the Toilet? N -  In the past six months, have you accidently leaked urine? N -  Do you have problems with loss of bowel control? N -  Managing your Medications? N -  Managing your Finances? Y -  Housekeeping or managing your Housekeeping? Y -  Some recent data might be hidden    Fall/Depression Screening: Fall Risk  04/19/2017 04/12/2017 05/03/2014  Falls in the past year? Yes Yes No  Number falls in past yr: 2 or more 2 or more -  Injury with Fall? Yes Yes -  Risk Factor Category  High Fall Risk High Fall Risk -  Risk for fall due to : History of fall(s);Impaired balance/gait;Impaired mobility History of fall(s);Impaired balance/gait;Medication side effect;Impaired mobility -  Follow up Education provided;Falls prevention discussed Falls evaluation completed;Falls prevention discussed;Education provided -   PHQ 2/9 Scores 04/19/2017 04/12/2017 05/03/2014 03/12/2013  PHQ - 2 Score 0 0 0 0    Assessment:  Patient reported that he is feeling a little better today. Patient was at the emergency room earlier this week due to gastroparesis. He denied being able to take any of his medications that day and had to call EMS to take him to the hospital. He stated that they checked him out to make sure it was not heart related and it was his gastroparesis. He stated that they gave him a shot of phenergan and it helped, however when he arrived a back at home, he was sick again. He stated that he thinks that was related to the ambulance ride home as he does get motion sickness easily. He stated that he was feeling nauseated this morning and really didn't get up moving around  until 1 pm today, but is feeling better right now.  Patient still has [facility] telemonitoring system at home, however it will end soon. RNCM provided patient with BP cuff for long term monitoring and demonstrated how to use. Patient and his caregiver were able to return demonstrate how to properly use the monitor. RNCM encouraged them to write his daily weights and BP measurements in his [facility] calendar.  Patient has an open wound with drainage to right lower extremity covered with a bandage. Patient's home health nurse, Kriste BasqueBecky placed the dressing on Monday and is expected to come back today. He stated that she has been coming twice a week, but her supervisor came out today for a visit and assessment and stated that  they will be increasing visits to 3 times a week. She is cleaning wound and dressing it and providing patient with supplies in the event the dressing becomes soiled. Patient reported that last week, he had a lot of drainage and they were having to change it in between her visits, but that has gotten better. There is brownish drainage noted at the bottom edges of the dressing today. Patient's lower extremity does erythema that was present prior to the wound, but there is new erythema noted above the top of the dressing (higher than his normal area of redness). Patient denies any fevers or chills. Patient also continues to have physical therapy.  Patient reported that he has to go in to complete some paperwork so that he can get a new motorized scooter. He does have an old one that no longer works and belonged to his mother, but has not had one of his own. His doctor has approved this and he has to go in to the DME provider to discuss what ones are available and let him choose which one works best for him and for his budget.  Plan: Will follow up with patient within the next 2 weeks for continued follow up.   TurkeyVictoria R. Mac Dowdell, RN, BSN, CCM [facility] Care Management Coordinator [phone]

## 2017-05-24 DIAGNOSIS — L089 Local infection of the skin and subcutaneous tissue, unspecified: Secondary | ICD-10-CM | POA: Diagnosis not present

## 2017-05-27 ENCOUNTER — Other Ambulatory Visit: Payer: Self-pay | Admitting: Pharmacist

## 2017-05-27 NOTE — Patient Outreach (Signed)
[facility]N) Care Management  05/27/2017  Samuel [name] Dec 25, 1950 [id number]   [name] was called to follow up on Samuel [name]'s application. Today the representative said they did not have a copy of his out-of-pocket spend. An email was sent to Samuel GauzeSheena [name] at [facility] to request the [name] again.  Patient was called and informed of the status of his application. HIPAA identifiers were obtained.  Plan: Once the [name] is received, fax it to [facility].  Samuel McardleKatina J. [name], PharmD, BCACP [facility] Clinical Pharmacist [phone]

## 2017-05-28 ENCOUNTER — Ambulatory Visit: Payer: Self-pay | Admitting: Pharmacist

## 2017-05-28 DIAGNOSIS — I214 Non-ST elevation (NSTEMI) myocardial infarction: Secondary | ICD-10-CM | POA: Diagnosis not present

## 2017-05-28 DIAGNOSIS — F329 Major depressive disorder, single episode, unspecified: Secondary | ICD-10-CM | POA: Diagnosis not present

## 2017-05-28 DIAGNOSIS — E1169 Type 2 diabetes mellitus with other specified complication: Secondary | ICD-10-CM | POA: Diagnosis not present

## 2017-05-28 DIAGNOSIS — I2510 Atherosclerotic heart disease of native coronary artery without angina pectoris: Secondary | ICD-10-CM | POA: Diagnosis not present

## 2017-05-28 DIAGNOSIS — I252 Old myocardial infarction: Secondary | ICD-10-CM | POA: Diagnosis not present

## 2017-05-28 DIAGNOSIS — E1165 Type 2 diabetes mellitus with hyperglycemia: Secondary | ICD-10-CM | POA: Diagnosis not present

## 2017-05-28 DIAGNOSIS — E1143 Type 2 diabetes mellitus with diabetic autonomic (poly)neuropathy: Secondary | ICD-10-CM | POA: Diagnosis not present

## 2017-05-28 DIAGNOSIS — I10 Essential (primary) hypertension: Secondary | ICD-10-CM | POA: Diagnosis not present

## 2017-05-28 DIAGNOSIS — L97411 Non-pressure chronic ulcer of right heel and midfoot limited to breakdown of skin: Secondary | ICD-10-CM | POA: Diagnosis not present

## 2017-05-28 DIAGNOSIS — E11621 Type 2 diabetes mellitus with foot ulcer: Secondary | ICD-10-CM | POA: Diagnosis not present

## 2017-05-28 DIAGNOSIS — E1151 Type 2 diabetes mellitus with diabetic peripheral angiopathy without gangrene: Secondary | ICD-10-CM | POA: Diagnosis not present

## 2017-05-28 DIAGNOSIS — E1142 Type 2 diabetes mellitus with diabetic polyneuropathy: Secondary | ICD-10-CM | POA: Diagnosis not present

## 2017-05-28 DIAGNOSIS — K3184 Gastroparesis: Secondary | ICD-10-CM | POA: Diagnosis not present

## 2017-05-28 DIAGNOSIS — J449 Chronic obstructive pulmonary disease, unspecified: Secondary | ICD-10-CM | POA: Diagnosis not present

## 2017-05-29 ENCOUNTER — Other Ambulatory Visit: Payer: Self-pay | Admitting: Pharmacist

## 2017-05-29 NOTE — Patient Outreach (Signed)
[facility](THN) Care Management  05/29/2017  Samuel BusingJohn D [name] Sep 03, 1950 [id number]   Samuel [name] statement from Samuel [name] was received and was faxed to [name] [name] to accompany the patient's application.  Plan: Follow up in 3 business days.   Samuel McardleKatina J. Jazmarie [name], PharmD, BCACP [facility] Clinical Pharmacist [phone]

## 2017-05-30 DIAGNOSIS — I252 Old myocardial infarction: Secondary | ICD-10-CM | POA: Diagnosis not present

## 2017-05-30 DIAGNOSIS — I10 Essential (primary) hypertension: Secondary | ICD-10-CM | POA: Diagnosis not present

## 2017-05-30 DIAGNOSIS — L0390 Cellulitis, unspecified: Secondary | ICD-10-CM | POA: Diagnosis not present

## 2017-05-30 DIAGNOSIS — I2510 Atherosclerotic heart disease of native coronary artery without angina pectoris: Secondary | ICD-10-CM | POA: Diagnosis not present

## 2017-05-30 DIAGNOSIS — E1151 Type 2 diabetes mellitus with diabetic peripheral angiopathy without gangrene: Secondary | ICD-10-CM | POA: Diagnosis not present

## 2017-05-30 DIAGNOSIS — I214 Non-ST elevation (NSTEMI) myocardial infarction: Secondary | ICD-10-CM | POA: Diagnosis not present

## 2017-05-30 DIAGNOSIS — Z89512 Acquired absence of left leg below knee: Secondary | ICD-10-CM | POA: Diagnosis not present

## 2017-05-30 DIAGNOSIS — Z794 Long term (current) use of insulin: Secondary | ICD-10-CM | POA: Diagnosis not present

## 2017-05-30 DIAGNOSIS — E1142 Type 2 diabetes mellitus with diabetic polyneuropathy: Secondary | ICD-10-CM | POA: Diagnosis not present

## 2017-05-30 DIAGNOSIS — E11621 Type 2 diabetes mellitus with foot ulcer: Secondary | ICD-10-CM | POA: Diagnosis not present

## 2017-05-30 DIAGNOSIS — E1169 Type 2 diabetes mellitus with other specified complication: Secondary | ICD-10-CM | POA: Diagnosis not present

## 2017-05-30 DIAGNOSIS — K3184 Gastroparesis: Secondary | ICD-10-CM | POA: Diagnosis not present

## 2017-05-30 DIAGNOSIS — L97411 Non-pressure chronic ulcer of right heel and midfoot limited to breakdown of skin: Secondary | ICD-10-CM | POA: Diagnosis not present

## 2017-05-30 DIAGNOSIS — J449 Chronic obstructive pulmonary disease, unspecified: Secondary | ICD-10-CM | POA: Diagnosis not present

## 2017-05-30 DIAGNOSIS — E1143 Type 2 diabetes mellitus with diabetic autonomic (poly)neuropathy: Secondary | ICD-10-CM | POA: Diagnosis not present

## 2017-05-30 DIAGNOSIS — E1165 Type 2 diabetes mellitus with hyperglycemia: Secondary | ICD-10-CM | POA: Diagnosis not present

## 2017-05-30 DIAGNOSIS — F329 Major depressive disorder, single episode, unspecified: Secondary | ICD-10-CM | POA: Diagnosis not present

## 2017-05-30 DIAGNOSIS — E118 Type 2 diabetes mellitus with unspecified complications: Secondary | ICD-10-CM | POA: Diagnosis not present

## 2017-06-03 ENCOUNTER — Other Ambulatory Visit: Payer: Self-pay | Admitting: Pharmacist

## 2017-06-03 NOTE — Patient Outreach (Signed)
[facility]) Care Management  06/03/2017  Beckie BusingJohn D Balicki [birth year] [id number]   Called [name] on the patient's behalf. The representative said the patient was approved 05/29/17 for a 90 day supply of Eliquis. The representative recommended that I call [facility] about delivery as they are the pharmacy [facility] uses to dispense medications to patients.  [facility] was called. The representative said the patient's profile should be set up tomorrow but they would need to speak with the patient to set up the initial delivery.  The representative said the patient would be put on a "call list" or he could call and set up his delivery.  Patient was called. HIPAA identifiers were obtained. Patient was very happy he was approved and said he would call [facility] tomorrow to set up delivery.  [facility]:  [phone] option 3   Plan: Follow up with the patient about delivery in 5-7 business days.  Beecher McardleKatina J. Emmerson Taddei, PharmD, BCACP [facility] Clinical Pharmacist [phone]

## 2017-06-04 DIAGNOSIS — E1165 Type 2 diabetes mellitus with hyperglycemia: Secondary | ICD-10-CM | POA: Diagnosis not present

## 2017-06-04 DIAGNOSIS — L97411 Non-pressure chronic ulcer of right heel and midfoot limited to breakdown of skin: Secondary | ICD-10-CM | POA: Diagnosis not present

## 2017-06-04 DIAGNOSIS — I2510 Atherosclerotic heart disease of native coronary artery without angina pectoris: Secondary | ICD-10-CM | POA: Diagnosis not present

## 2017-06-04 DIAGNOSIS — E11621 Type 2 diabetes mellitus with foot ulcer: Secondary | ICD-10-CM | POA: Diagnosis not present

## 2017-06-04 DIAGNOSIS — I10 Essential (primary) hypertension: Secondary | ICD-10-CM | POA: Diagnosis not present

## 2017-06-04 DIAGNOSIS — E1151 Type 2 diabetes mellitus with diabetic peripheral angiopathy without gangrene: Secondary | ICD-10-CM | POA: Diagnosis not present

## 2017-06-04 DIAGNOSIS — I252 Old myocardial infarction: Secondary | ICD-10-CM | POA: Diagnosis not present

## 2017-06-04 DIAGNOSIS — E1169 Type 2 diabetes mellitus with other specified complication: Secondary | ICD-10-CM | POA: Diagnosis not present

## 2017-06-04 DIAGNOSIS — F329 Major depressive disorder, single episode, unspecified: Secondary | ICD-10-CM | POA: Diagnosis not present

## 2017-06-04 DIAGNOSIS — J449 Chronic obstructive pulmonary disease, unspecified: Secondary | ICD-10-CM | POA: Diagnosis not present

## 2017-06-04 DIAGNOSIS — E1142 Type 2 diabetes mellitus with diabetic polyneuropathy: Secondary | ICD-10-CM | POA: Diagnosis not present

## 2017-06-04 DIAGNOSIS — E1143 Type 2 diabetes mellitus with diabetic autonomic (poly)neuropathy: Secondary | ICD-10-CM | POA: Diagnosis not present

## 2017-06-04 DIAGNOSIS — K3184 Gastroparesis: Secondary | ICD-10-CM | POA: Diagnosis not present

## 2017-06-10 DIAGNOSIS — I10 Essential (primary) hypertension: Secondary | ICD-10-CM | POA: Diagnosis not present

## 2017-06-10 DIAGNOSIS — I2510 Atherosclerotic heart disease of native coronary artery without angina pectoris: Secondary | ICD-10-CM | POA: Diagnosis not present

## 2017-06-10 DIAGNOSIS — E1143 Type 2 diabetes mellitus with diabetic autonomic (poly)neuropathy: Secondary | ICD-10-CM | POA: Diagnosis not present

## 2017-06-10 DIAGNOSIS — E1151 Type 2 diabetes mellitus with diabetic peripheral angiopathy without gangrene: Secondary | ICD-10-CM | POA: Diagnosis not present

## 2017-06-10 DIAGNOSIS — F329 Major depressive disorder, single episode, unspecified: Secondary | ICD-10-CM | POA: Diagnosis not present

## 2017-06-10 DIAGNOSIS — E11621 Type 2 diabetes mellitus with foot ulcer: Secondary | ICD-10-CM | POA: Diagnosis not present

## 2017-06-10 DIAGNOSIS — I252 Old myocardial infarction: Secondary | ICD-10-CM | POA: Diagnosis not present

## 2017-06-10 DIAGNOSIS — K3184 Gastroparesis: Secondary | ICD-10-CM | POA: Diagnosis not present

## 2017-06-10 DIAGNOSIS — L97411 Non-pressure chronic ulcer of right heel and midfoot limited to breakdown of skin: Secondary | ICD-10-CM | POA: Diagnosis not present

## 2017-06-10 DIAGNOSIS — E1169 Type 2 diabetes mellitus with other specified complication: Secondary | ICD-10-CM | POA: Diagnosis not present

## 2017-06-10 DIAGNOSIS — J449 Chronic obstructive pulmonary disease, unspecified: Secondary | ICD-10-CM | POA: Diagnosis not present

## 2017-06-10 DIAGNOSIS — I214 Non-ST elevation (NSTEMI) myocardial infarction: Secondary | ICD-10-CM | POA: Diagnosis not present

## 2017-06-10 DIAGNOSIS — E1142 Type 2 diabetes mellitus with diabetic polyneuropathy: Secondary | ICD-10-CM | POA: Diagnosis not present

## 2017-06-10 DIAGNOSIS — E1165 Type 2 diabetes mellitus with hyperglycemia: Secondary | ICD-10-CM | POA: Diagnosis not present

## 2017-06-14 ENCOUNTER — Other Ambulatory Visit: Payer: Self-pay | Admitting: *Deleted

## 2017-06-14 ENCOUNTER — Other Ambulatory Visit: Payer: Self-pay | Admitting: Pharmacist

## 2017-06-14 NOTE — Patient Outreach (Signed)
[facility]) Care Management  06/14/2017  Samuel BusingJohn D [name] Mar 21, 1951 [id number]   Patient was called to follow up on the delivery of Eliquis. HIPAA identifiers were obtained.  Patient said he did not receive his shipment of Eliquis.  During on 06/03/17 interaction, patient was asked to call [facility] to set up delivery of Eliquis.  [facility] the dispensing pharmacy for [name]'s patient assistance program. Patient said he called and set up delivery.  I spoke with a representative a [facility] Jasmine December(Sharon) who said the patient's Eliquis should be delivered to him next Wednesday (June 19, 2017).  Patient was called back and informed of the status.  Plan: Call patient back next week to check on delivery.  Beecher McardleKatina J. Terique Kawabata, PharmD, BCACP [facility] Clinical Pharmacist [phone]

## 2017-06-14 NOTE — Patient Outreach (Signed)
[name] Care Management [facility] CM Telephone Outreach  06/14/2017  Samuel BusingJohn D [name] 1951/03/23 [id number]  Successful telephone outreach to Samuel ServeJohn [name], 66 y/o male originally referred to [name] CM for transition of care after hospitalization October 21-25, 2018 for NSTEMI.  Patient completed transition of care without [name] re-admission within 30 days.  Patient has history including, but not limited to, CAD, DM, (L) BKA in 2016, HTN/ HLD, GERD, OSA, and gastroparesis.  HIPAA/ identity verified with patient during phone call today; explained to patient that I was calling today to follow up in place of previously active [facility] RN CCM [place]; patient verbalizes understanding and agreement and provides verbal consent to continue participation with [facility] Community CM program.  Today, patient reports that he "is doing very well," and he denies pain, needs, concerns/ problems; patient sounds to be in no obvious/ apparent distress throughout entirety of phone call today.  Patient further reports:  Medications: -- Has all medicationsand takes as prescribed;denies questions about current medications.  -- family prepares medications using weekly pill planner box; patient then takes medications independently -- denies issues with swallowing medications -- patient acknowledges that he is actively working with [name] C[facility] Pharmacist, Samuel CobbsKatina [name]  [facility]: -- [facility]; [facility]; states "they have been coming for quite awhile now." -- [facility] "to help with my leg wounds; they do dressing changes." -- [facility]  Provider appointments: -- Denies upcoming provider appointments; states has "visited doctor's recently after my last [name] visit." -- endorses Dr. Pearson GrippeJames [name] as PCP   Safety/ Mobility/ Falls: -- denies new/ recent falls, but  states that he has had "several falls" this year; reports none since "August or September" -- assistive devices: uses "walker or wheelchair" "all the time." -- general fall risks/ prevention education discussed with patient today  [name] doctorocial/ Community Resource needs: -- currently denies community resource needs, stating supportive family members that assist with care needs as indicated -- family provides transportation for patient to all provider appointments, errands, etc -- primary caregiver endorsed as "Samuel BeltonDianne," cousin who lives nearby and actively participates in all aspects of patient care -- patient reports essentially independent in all IADL's and ADL's  Advanced Directive (AD) Planning:   --reports does currently have exisisting AD in place for living will/ HCPOA.  -- reports also has MOST form in place at his home; states he is a "DNR" and does not wish to make changes to any aspect of his AD's at this time  Self-health management of chronic disease state of DM: -- reports DM as his "biggest health issue." -- reports does not regularly check blood sugars; states "I check blood sugars maybe every 3 or 4 days."  Reports that he has gastroparesis, which limits his food choices, and causes nausea "every day;" therefore, patient does not regularly monitor his blood sugars because he "doesn't eat regularly due to gastroparesis." -- accurately reports last A1-C of 9.6, value from last [name] visit; accurately verbalizes good understanding of significance/ importance of A1-C measurements -- reports visual barriers, due to needing to get cataracts removed bilaterally; reports this was to happen this autumn, but his hospitalization prevented the procedure form being completed; stated that he plans to make another appointment "at the first of the new year." -- discussed with patient value of/ rationale for regular blood sugar monitoring, and patient agrees today  to begin monitoring and recording  blood sugars at least once daily, for review during [facility] RN CCM home visit, which we scheduled today.    Patient denies further issues, concerns, or problems today.  I provided/ confirmed that patient has my direct phone number, the main [facility] CM office phone number, and the [facility] CM 24-hour nurse advice phone number should issues arise prior to next scheduled [facility] Community CM outreach with scheduled initial home visit in 2 weeks.  Plan:  Patient will take medications as prescribed and will attend all scheduled provider appointments   Patient will continue actively participating with home health services for wound care management  Patient will continue actively working with [name] Pharmacist on stated medication needs  Patient will begin monitoring and recording blood sugars at least one time per day  [facility] Community CM involvement in patient's care to continue with scheduled initial home visit in 2 weeks  [facility] CM Care Plan Problem One     Most Recent Value  Care Plan Problem One  Need for self-health management of chronic disease state of DM, as evidenced by patient reporting  Role Documenting the Problem One  Care Management Coordinator  Care Plan for Problem One  Active  [facility] Long Term Goal   Over the next 60 days, patient will be able to verbalize 3 self-health strategies for self- management of chronic disease state of DM, as evidenced by patient reporting of same during [facility] C[facility] outreach  [facility] Long Term Goal Start Date  06/14/17  Interventions for Problem One Long Term Goal  Discussed with patient his current understanding of chronic disease state of DM,  current diet and current habits around monitoring and recording blood sugars,  discussed with patient value of regular blood sugar monitoring  [facility] CM Short Term Goal #1   Over the next 30 days, patient will monitor and record blood sugars at least one time per day, as evidenced by patient reporting and review of blood sugar values during [facility] RN  CCM outreach  [facility] CM Short Term Goal #1 Start Date  06/14/17  Interventions for Short Term Goal #1  Discussed with patient value of monitoring and recording blood sugars on a consistent basis,  encouraged patient to monitor and record blood sugars at least one time per day  [facility] CM Short Term Goal #2   Over the next 30 days, patient will meet with [facility] C[facility] to discuss goals and challenges around chronic disease state of DM, as evidenced by successful completion of [facility] RN CCM home visit  [facility] CM Short Term Goal #2 Start Date  06/14/17  Interventions for Short Term Goal #2  Discussed role of [facility] RN CCM with patient,  scheduled initial home visit with patient and caregiver in 2 weeks      I appreciate the opportunity to participate in Elyon's care,  Caryl PinaLaine Mckinney Tousey, RN, BSN, [facility] [employer] Community Care Coordinator [facility] Care Management  [phone]

## 2017-06-20 ENCOUNTER — Other Ambulatory Visit: Payer: Self-pay | Admitting: Pharmacist

## 2017-06-20 NOTE — Patient Outreach (Signed)
[facility]) Care Management  06/20/2017  Samuel BusingJohn D [name] 1950/11/13 [id number]   Patient was called to inquire about Eliquis delivery.  HIPAA identifiers were obtained.  Patient confirmed Samuel [name] called him yesterday to confirm his address and the names of his medications.  It is strange that they would ask him all of those questions as delivery had already been set up.  Patient said the shipment should arrive in the next 5-7 days.   Plan: Call patient back to check on delivery.   Samuel [name], PharmD, BCACP [facility] Clinical Pharmacist [phone]

## 2017-06-24 DIAGNOSIS — L98499 Non-pressure chronic ulcer of skin of other sites with unspecified severity: Secondary | ICD-10-CM | POA: Diagnosis not present

## 2017-06-24 DIAGNOSIS — E11622 Type 2 diabetes mellitus with other skin ulcer: Secondary | ICD-10-CM | POA: Diagnosis not present

## 2017-06-24 DIAGNOSIS — R609 Edema, unspecified: Secondary | ICD-10-CM | POA: Diagnosis not present

## 2017-06-25 ENCOUNTER — Other Ambulatory Visit: Payer: Self-pay | Admitting: Pharmacist

## 2017-06-25 NOTE — Patient Outreach (Signed)
[facility]) Care Management  06/25/2017  Beckie BusingJohn D Couey [birth year] [id number]   Patient was called to follow up on medication assistance and delivery of Eliquis.  HIPAA identifiers were obtained. Patient confirmed he received a 2655-month supply of Eliquis in the mail.   Plan: Close pharmacy case. Patient has my number and understands he can call at any time with pharmacy questions or concerns.   Beecher McardleKatina J. Chinara Hertzberg, PharmD, BCACP [facility] Clinical Pharmacist [phone]

## 2017-06-26 ENCOUNTER — Encounter: Payer: Self-pay | Admitting: *Deleted

## 2017-06-26 ENCOUNTER — Other Ambulatory Visit: Payer: Self-pay | Admitting: *Deleted

## 2017-06-26 NOTE — Patient Outreach (Signed)
Samuel [name] & Samuel [name]) Care Management  Samuel [name] Initial Home Visit 06/26/2017  Samuel [name] [birth year] [id number]  Samuel [name] is a 67 y.o. male originally referred to Samuel [name] for transition of care after hospitalization October 21-25, 2018 for NSTEMI.  Patient completed transition of care without hospital re-admission.  Patient has history including, but not limited to, CAD, DM, (L) BKA in 2016, HTN/ HLD, GERD, OSA, and gastroparesis.  HIPAA/ identity verified with patient in person at his home today; patient's cousin/ primary caregiver Samuel [name] is also present for visit and actively participates.  Discussed with patient role of [place], and patient provided both verbal and updated written consent.  Pleasant 120 minute home visit.  Today, patient reports that he "is doing pretty good," and he denies pain, needs, concerns/ problems; patient is in no obvious/ apparent distress throughout entirety of home visit today.  Subjective:  "I would really like to get my A1-C down; it's so difficult having DM and gastroparesis together; it makes it hard to do what you should do."  Assessment:  Samuel [name] is continuing to recuperate well after his October 2018 hospital visit; he is committed to attending all provider appointments and adhering to his medication regimen and overall plan of care.  Samuel [name] has meals on wheels delivered to his home 2-3 times/ week and he denies further community resource needs, stating supportive family members that provide assistance with care needs as indicated.  Samuel [name] continues driving himself to all provider appointments.  Samuel [name] would like to have better control over his diabetes and to eventually decrease his A1-C.   Patient further reports:  Medications: -- Has all medicationsand takes as prescribed;denies questions about current medications; acknowledges that he had previously worked with Samuel [name], [facility] Pharmacist for medication assistance and education;  denies current needs today stating "everything is straightened out."  -- cousin Samuel [name] prepares medications using weekly pill planner box; patient then takes medications independently -- denies issues with swallowing medications -- in home medication reconciliation completed today -- patient reports "antibiotics for leg wounds" were continued for 10 more days by PCP whom patient visited with earlier this week  Home health [facility]) services: -- [name] services for wound management through [name] -- reports Monday visit from [name] "cancelled" due to patient's scheduled PCP appointment this week; patient reports "expects next visit today or tomorrow."  Provider appointments: -- patient visited with PCP Dr. Jani Gravel on Monday 06/24/17 -- patient reports follow up visit ":in 2 weeks" for PCP evaluation of (R) leg wounds  Safety/ Mobility/ Falls: -- assistive devices: uses "Samuel [name] or wheelchair" "all the time;" patient using Samuel [name] today, and his gait is slow, steady and purposeful with ambulation around home -- no obvious fall hazards/ risks noted in patient's immediate environment; house has moderate clutter with wide open spaces for ambulation  -- general fall risks/ prevention education discussed with patient today  Self-health management of chronic disease state of DM: -- continues to report DM as his "biggest health issue," along with "gastroparesis;" states he has had difficulty managing both conditions as they "oppose" one another; states he knows he needs to eat but sometimes, "just can't." -- reports has been checking morning/ fasting blood sugars every day since last [facility] CCM RN phone call; 11 days of home recorded values reviewed with patient today and blood sugar ranges generally noted 120-188; patient has 3 readings > 200, but < 300.  Blood sugar today 235 -- reviewed last A1-C  of 9.6, discussed significance/ importance of A1-C measurements; patient verbalizes accurate understanding of  significance of A1-C; reports goal A1-C "under 7" -- educational material provided and reviewed re: diabetes self-health management (EMMI and "Living Well With Diabetes" booklet); encouraged patient to review and write down questions for further review/ discussion -- patient reports he monitors weights "several times a week," and daily BP's; these recordings were also reviewed with patient today -- patient denies history CHF, although he reports that his PCP told him he "has fluid build up" and increased his diuretic dosing; patient states his "first and only MI" was in October 2018 (last hospitalization); and also states that "used to have" OSA, but was told that he "no longer has."  Encouraged patient to discuss his current active diagnosis with PCP during next office visit in 2 weeks, and patient agrees to do so. -- recorded weights since 05/24/17 reviewed with patient today; weight ranges 284-307 lbs during time period with weight today of 300 lbs -- recorded BP's over last week reviewed: 106-166/63-98  Patient denies further issues, concerns, or problems today. I confirmed that patient hasmy direct phone number, the main [place] office phone number, and the [facility] CM 24-hour nurse advice line phone number should issues arise prior to next scheduled [place] outreach with scheduled routinel home visit in 3 weeks after next PCP appointment.  Objective:    BP 108/66   Pulse 70   Resp 16   Ht 1.905 m (_0 )   Wt 300 lb (136.1 kg)   SpO2 96%   BMI 37.50 kg/m   Review of Systems  Constitutional: Negative.   Respiratory: Negative for cough, shortness of breath and wheezing.        Reports slight SOB with activity; none exhibited today during home visit   Cardiovascular: Positive for orthopnea and leg swelling. Negative for chest pain and palpitations.  Gastrointestinal: Positive for nausea. Negative for abdominal pain.       Baseline nausea reported secondary to patient's report of  chronic gastroparesis; reports occasional vomiting with gastroparesis episodes, denies vomiting today  Genitourinary: Positive for frequency and urgency.       Diuretic therapy  Musculoskeletal: Positive for joint pain and myalgias. Negative for falls.       Reports chronic pain secondary to arthritis  Skin:       (R) LE skin wounds present anteriorly and posteriorly; patient reports wounds have been present since October 2018 hospital visit; patient regularly visited by [facility] RN for dressing changes; dressings are clean, dry, intact today.  Patient reports improvement in wounds over last few weeks; skin generally is red/ erythematous, with multiple small open sores; (R) lower leg edematous +1-2  Neurological: Positive for tingling.  Psychiatric/Behavioral: Negative for depression. The patient is not nervous/anxious.    Physical Exam  Constitutional: He is oriented to person, place, and time. He appears well-developed and well-nourished. No distress.    Obese  Cardiovascular: Normal rate, regular rhythm, normal heart sounds and intact distal pulses.  Respiratory: Effort normal and breath sounds normal. No respiratory distress. He has no wheezes. He has no rales.  GI: Soft. Bowel sounds are normal.  Musculoskeletal: He exhibits edema.  (R) LE; see ROS  Neurological: He is alert and oriented to person, place, and time.  Skin: Skin is warm and dry. There is erythema.  (R) LE/ See ROS  Psychiatric: He has a normal mood and affect. His behavior is normal. Judgment and thought content normal.  Encounter Medications:   Outpatient Encounter Medications as of 06/26/2017  Medication Sig Note  . acarbose (PRECOSE) 50 MG tablet Take 50 mg 3 (three) times daily with meals by mouth.   [name] amLODipine (NORVASC) 10 MG tablet Take 1 tablet (10 mg total) by mouth daily.   [name] apixaban (ELIQUIS) 5 MG TABS tablet Take 1 tablet (5 mg total) by mouth 2 (two) times daily.   [name] aspirin 81 MG EC tablet Take 1 tablet (81  mg total) by mouth daily.   [name] atorvastatin (LIPITOR) 80 MG tablet Take 1 tablet (80 mg total) by mouth daily at 6 PM.   . Canagliflozin (INVOKANA) 100 MG TABS Take 100 mg by mouth See admin instructions. invokana 100 mg daily. Alternating every other week between Jardiance and Invokana 04/26/2017: Invokana 354m in the home. Patient alternates between IRaymondand Jardiance depending on which samples he has.  . clopidogrel (PLAVIX) 75 MG tablet Take 1 tablet (75 mg total) by mouth daily.   . diphenhydramine-acetaminophen (TYLENOL PM) 25-500 MG TABS tablet Take 1 tablet by mouth at bedtime. 04/23/2017: TAKE AS NEEDED  . docusate sodium (COLACE) 100 MG capsule Take 100 mg by mouth daily as needed (for constipation).    .Marland Kitchenempagliflozin (JARDIANCE) 25 MG TABS tablet Take 10 mg See admin instructions by mouth. Alternating every other week between [place]  . ferrous sulfate 325 (65 FE) MG tablet Take 325 mg by mouth every evening.    . furosemide (LASIX) 20 MG tablet Take 1 tablet (20 mg total) by mouth daily. 06/26/2017: Patient reports now taking (2) 20 mg po QD in am after PCP office visit on 06/24/17; reports to see PCP again in 2 weeks for further evaluation/ instruction   . gabapentin (NEURONTIN) 300 MG capsule Take 300 mg by mouth 3 (three) times daily. 10/06/2014: .  . ibuprofen (ADVIL,MOTRIN) 200 MG tablet Take 400 mg every 6 (six) hours as needed by mouth for headache or pain.   .Marland Kitcheninsulin detemir (LEVEMIR) 100 UNIT/ML injection Inject 85 Units into the skin 2 (two) times daily.  06/26/2017: Patient reports taking 80 U q am and q pm   . insulin lispro (HUMALOG) 100 UNIT/ML injection Inject 0-10 Units into the skin 3 (three) times daily with meals. Inject SQ per sliding scale 150-250 (5 units), 251-350(8 units), 351-450(10 units), >450=10 units 06/26/2017: Patient reports rarely uses/ needs short acting insulin   . lansoprazole (PREVACID) 30 MG capsule Take 30 mg by mouth 2 (two) times daily.     . metoCLOPramide (REGLAN) 5 MG tablet Take 10 mg by mouth at bedtime.  06/26/2017: Patient reports taking only as needed; reports has not needed recently   . metoprolol tartrate (LOPRESSOR) 25 MG tablet Take 1 tablet (25 mg total) by mouth 2 (two) times daily.   . nitroGLYCERIN (NITROSTAT) 0.4 MG SL tablet Place 1 tablet (0.4 mg total) under the tongue every 5 (five) minutes as needed for chest pain. 06/26/2017: Has not needed recently  . pioglitazone (ACTOS) 30 MG tablet Take 30 mg by mouth at bedtime.    . polyethylene glycol (MIRALAX / GLYCOLAX) packet Take 17 g by mouth daily as needed for moderate constipation.   . prochlorperazine (COMPAZINE) 10 MG tablet Take 10 mg by mouth daily as needed for nausea/vomiting.   . promethazine (PHENERGAN) 25 MG tablet Take 25 mg by mouth every 8 (eight) hours as needed for nausea or vomiting. Take one tablet by mouth every 8 hours  as needed nausea/vomiting   . ranitidine (ZANTAC) 150 MG tablet Take 150 mg by mouth at bedtime.    . ranolazine (RANEXA) 500 MG 12 hr tablet Take 1 tablet (500 mg total) by mouth 2 (two) times daily.   . sitaGLIPtin (JANUVIA) 100 MG tablet Take 100 mg daily by mouth.   . traMADol (ULTRAM) 50 MG tablet Take 100 mg by mouth every 12 (twelve) hours as needed (pain).    No facility-administered encounter medications on file as of 06/26/2017.    Plan:  Patient will take medications as prescribed and will attend all scheduled provider appointments   Patient will continue actively participating with home health services for wound care management  Patient will continue monitoring and recording blood sugars at least one time per day  Patient will review educational material provided him today re: self-health management of DM and will write down questions for further discussion  Patient will promptly notify care providers for any new concerns, issues, or problems that arise  I will share notes/ care plan from today's [place] initial  home visit with patient's PCP  [facility] CM ioutreach to continue with scheduled routine home visit in 3 weeks  [facility] CM Care Plan Problem One     Most Recent Value  Care Plan Problem One  Need for self-health management of chronic disease state of DM, as evidenced by patient reporting  Role Documenting the Problem One  Care Management Coordinator  Care Plan for Problem One  Active  [facility] Long Term Goal   Over the next 60 days, patient will be able to verbalize 3 self-health strategies for self- management of chronic disease state of DM, as evidenced by patient reporting of same during [place] outreach  [facility] Long Term Goal Start Date  06/14/17  Interventions for Problem One Long Term Goal  Using teachback method, provided and discussed printed EMMI educational material re: self-health management of DM, and booklet "Living Well with Diabetes," discussed patient's current understanding of DM and A1-C levels,  reviewed with patient most recent A1-C level  [facility] CM Short Term Goal #1   Over the next 30 days, patient will monitor and record blood sugars at least one time per day, as evidenced by patient reporting and review of blood sugar values during [facility] RN CCM outreach  [facility] CM Short Term Goal #1 Start Date  06/14/17  Interventions for Short Term Goal #1  Reviewed all recently recorded blood sugars at home,  discussed blood sugar trends and use of oral DM medications and insulin,  provided patient with [facility] CM patient recording tool/ calendar and instructed patient in it's use,  encouraged patient to continue monitoring and recording blood sugars at least daily  [facility] CM Short Term Goal #2   Over the next 30 days, patient will meet with [facility] RN CCM to discuss goals and challenges around chronic disease state of DM, as evidenced by successful completion of [facility] RN CCM home visit  [facility] CM Short Term Goal #2 Start Date  06/14/17  [facility] CM Short Term Goal #2 Met Date  06/26/17  Interventions for Short Term Goal  #2  Reiterated role of [facility] RN CCM with patient,  discussed patient's goals fo participation in [place], RN, BSN, [employer] [facility] Care Management  [phone]

## 2017-07-05 DIAGNOSIS — L0390 Cellulitis, unspecified: Secondary | ICD-10-CM | POA: Diagnosis not present

## 2017-07-11 DIAGNOSIS — L98499 Non-pressure chronic ulcer of skin of other sites with unspecified severity: Secondary | ICD-10-CM | POA: Diagnosis not present

## 2017-07-11 DIAGNOSIS — Z89512 Acquired absence of left leg below knee: Secondary | ICD-10-CM | POA: Diagnosis not present

## 2017-07-11 DIAGNOSIS — E1165 Type 2 diabetes mellitus with hyperglycemia: Secondary | ICD-10-CM | POA: Diagnosis not present

## 2017-07-17 ENCOUNTER — Other Ambulatory Visit: Payer: Self-pay | Admitting: *Deleted

## 2017-07-17 ENCOUNTER — Encounter: Payer: Self-pay | Admitting: *Deleted

## 2017-07-17 NOTE — Patient Outreach (Signed)
[facility]) Care Management  [facility] Community CM Routine Home Visit 07/17/2017  CHESLEY VEASEY 03/08/1951 [id number]  Samuel [name] is a 67 y.o. male originally referred to [facility] Community CM for transition of care after hospitalization October 21-25, 2018 for NSTEMI. Patient completed transition of care without hospital re-admission. Patient has history including, but not limited to, CAD, DM, (L) BKA in 2016, HTN/ HLD, GERD, OSA, and gastroparesis. HIPAA/ identity verified with patient in person at his home today; patient's cousin/ primary caregiver Sedalia Muta is also present for visit and actively participates.  Pleasant 45 minute home visit.  Today, patient reports that he "is doing fine," and he denies pain, needs, concerns/ problems; patient states that he has had "a bad few days with nausea from gastroparesis" but that he "is better today."  Patient states that he has been nauseous over last few days, more than "normal."  Patient is in no obvious/ apparent distress throughout entirety of home visit today, and reports that he believes his nausea "is better today than it has been over the last several days."  Patient reports that he will be taking [name] to the doctor's office as soon as [facility] Community CM is completed today for a check up.  Subjective:  "I should have done better with sticking with checking my blood sugars since you were last here.... I am going to try to start back, and do better."  Assessment:  Demontrez continues to recuperate well after his October 2018 hospital visit; he remains committed to attending all provider appointments and adhering to his medication regimen and overall plan of care.  Osei has meals on wheels delivered to his home 2-3 times/ week and he denies further community resource needs, stating supportive family members that provide assistance with care needs as indicated.  Macrae continues driving himself to all provider appointments.  Calton would like to have  better control over his diabetes and to eventually decrease his A1-C, however, he is not consistent in monitoring/ recording his blood sugars.   Patient further reports:  Medications: -- Has all medicationsand takes as prescribed;denies changes/ questions about current medications;  reports that he will soon run out of his long acting insulin; however states that this has happened before, and his PCP "always gives him samples to cover until his next refill;" reports he will obtain sample form PCP this afternoon, since he will be taking his cousin to the doctor this afternoon anyway; declines need for [facility] pharmacy re-referral --cousin [name] continues preparingmedications using weekly pill planner box; patient then takes medications independently -- patient reports "antibiotics for leg wounds" have now been completed; reports took entire course of antibiotics as prescribed  Home health [facility]) services: -- [facility] services forwound management through [name] -- reports nurse visited last on Monday 07/15/17 and will return on Friday 07/19/17  Provider appointments: --patient visited with PCP Dr. [name] in "mid- January" for wound re-check; antibiotics were discontinued at that time -- patient reports cardiology office visit on July 23, 2017; reports plans to attend appointment, states will drive self  Safety/ Mobility/ Falls: -- assistive devices:continues to use "walker or wheelchair" "all the time;" patient using wheelchair today; denies new falls today -- no obvious fall hazards/ risks noted in patient's immediate environment; house has moderate clutter with wide open spaces for ambulation  -- general fall risks/ prevention education reiterated with patient today  Self-health management ofchronic disease state of DM: -- reports that he did not check  blood sugars as often as he said he would during last [facility] Community CM initial home visit 3 weeks ago; states that his nausea over  the last few days "threw him off." 10 days of home recorded values reviewed with patient today and blood sugar ranges generally noted 90-120's with isolated low of 70 and high of 285; did not check blood sugar this am -- educational material provided and reviewed re: diabetes self-health management (EMMI); encouraged patient to review and to resume checking blood sugars every day, if possible; patient stated he "just got off track" and agreed to resume checking blood sugars daily -- patient reports he has continued monitoring weights "several times a week," and weekly BP's; these recordings were also reviewed with patient today -- patient denies history CHF, although he reports that his PCP told him he "has fluid build up" and increased his diuretic dosing; patient states he will discuss with cardiologist at upcoming provider appointment scheduled 07/23/17 -- 10 recorded weights since last [facility] CCM home visit 06/26/17: weight ranges 305-312, which is significantly higher than last [facility] CCM home visit (284-307 lbs).  Patient denies concerning signs/ symptoms associated with weight gain; we reviewed signs/ symptoms to report immediately, and I encouraged patient to discuss whether or not he should continue monitoring weights regularly with his cardiologist during next week's office visit, and whether or not he has a definitive diagnosis of CHF, as patient continues to maintain that he does NOT have CHF and has never been told to perform daily weights; patient is in no obvious or acute distress or having noticeable issues around breathing, although an occasionaly expiratory wheeze was noted with auscultation of (R) upper posterior lung field -- recorded BP's over last week reviewed: 112-139/69-84  Patient denies further issues, concerns, or problems today. Iconfirmed that patient hasmy direct phone number, the main [facility] CM office phone number, and the [facility] CM 24-hour nurse advice line phone number should issues  arise prior to next scheduled [facility] Community CM outreachwith scheduled telephone call next week after cardiology appointment.  We briefly discussed the possibility of case closure next month, depending on patient progress around meeting his current and previously established [facility] CM goals.   Objective:   BP 134/62   Pulse 82   Resp 16   Wt (!) 313 lb 12.8 oz (142.3 kg)   SpO2 98%   BMI 39.22 kg/m    Review of Systems  Constitutional: Negative.  Negative for malaise/fatigue and weight loss.  Respiratory: Negative.  Negative for cough, shortness of breath and wheezing.   Cardiovascular: Positive for leg swelling. Negative for chest pain.       (R) LE slightly edematous +1  Gastrointestinal: Positive for nausea and vomiting. Negative for abdominal pain.       Hx gastroparesis-- chronic symptoms  Genitourinary: Positive for frequency and urgency.  Musculoskeletal: Negative for falls.  Skin:       (R) LE wound appears to be healing from last [facility] Community CM home visit; remains erythematous; wrapped in clean dry dressing  Neurological: Negative.  Negative for weakness.  Psychiatric/Behavioral: Negative for depression. The patient is not nervous/anxious.    Physical Exam  Constitutional: He is oriented to person, place, and time. He appears well-developed and well-nourished. No distress.  Cardiovascular: Normal rate, regular rhythm, normal heart sounds and intact distal pulses.  Respiratory: Effort normal. No respiratory distress. He has wheezes. He has no rales.  Occasional (R) posterior upper lung field wheezing noted  GI:  Soft. Bowel sounds are normal.  Musculoskeletal: He exhibits edema.  See ROS  Neurological: He is alert and oriented to person, place, and time.  Skin: Skin is warm and dry. There is erythema.  See ROS  Psychiatric: He has a normal mood and affect. His behavior is normal. Judgment and thought content normal.   Encounter Medications:   Outpatient Encounter  Medications as of 07/17/2017  Medication Sig Note  . acarbose (PRECOSE) 50 MG tablet Take 50 mg 3 (three) times daily with meals by mouth.   [name] amLODipine (NORVASC) 10 MG tablet Take 1 tablet (10 mg total) by mouth daily.   [name] apixaban (ELIQUIS) 5 MG TABS tablet Take 1 tablet (5 mg total) by mouth 2 (two) times daily.   [name] aspirin 81 MG EC tablet Take 1 tablet (81 mg total) by mouth daily.   [name] atorvastatin (LIPITOR) 80 MG tablet Take 1 tablet (80 mg total) by mouth daily at 6 PM.   . Canagliflozin (INVOKANA) 100 MG TABS Take 100 mg by mouth See admin instructions. invokana 100 mg daily. Alternating every other week between Jardiance and Invokana 04/26/2017: Invokana 300mg  in the home. Patient alternates between Murphy and Jardiance depending on which samples he has.  . clopidogrel (PLAVIX) 75 MG tablet Take 1 tablet (75 mg total) by mouth daily.   . diphenhydramine-acetaminophen (TYLENOL PM) 25-500 MG TABS tablet Take 1 tablet by mouth at bedtime. 04/23/2017: TAKE AS NEEDED  . docusate sodium (COLACE) 100 MG capsule Take 100 mg by mouth daily as needed (for constipation).    [name] empagliflozin (JARDIANCE) 25 MG TABS tablet Take 10 mg See admin instructions by mouth. Alternating every other week between [place]   . ferrous sulfate 325 (65 FE) MG tablet Take 325 mg by mouth every evening.    . furosemide (LASIX) 20 MG tablet Take 1 tablet (20 mg total) by mouth daily. 06/26/2017: Patient reports now taking (2) 20 mg po QD in am after PCP office visit on 06/24/17; reports to see PCP again in 2 weeks for further evaluation/ instruction   . gabapentin (NEURONTIN) 300 MG capsule Take 300 mg by mouth 3 (three) times daily. 10/06/2014: .  . ibuprofen (ADVIL,MOTRIN) 200 MG tablet Take 400 mg every 6 (six) hours as needed by mouth for headache or pain.   [name] insulin detemir (LEVEMIR) 100 UNIT/ML injection Inject 85 Units into the skin 2 (two) times daily.  06/26/2017: Patient reports taking 80 U q am and q pm    . insulin lispro (HUMALOG) 100 UNIT/ML injection Inject 0-10 Units into the skin 3 (three) times daily with meals. Inject SQ per sliding scale 150-250 (5 units), 251-350(8 units), 351-450(10 units), >450=10 units 06/26/2017: Patient reports rarely uses/ needs short acting insulin   . lansoprazole (PREVACID) 30 MG capsule Take 30 mg by mouth 2 (two) times daily.   . metoCLOPramide (REGLAN) 5 MG tablet Take 10 mg by mouth at bedtime.  06/26/2017: Patient reports taking only as needed; reports has not needed recently   . metoprolol tartrate (LOPRESSOR) 25 MG tablet Take 1 tablet (25 mg total) by mouth 2 (two) times daily.   . nitroGLYCERIN (NITROSTAT) 0.4 MG SL tablet Place 1 tablet (0.4 mg total) under the tongue every 5 (five) minutes as needed for chest pain. 06/26/2017: Has not needed recently  . pioglitazone (ACTOS) 30 MG tablet Take 30 mg by mouth at bedtime.    . polyethylene glycol (MIRALAX / GLYCOLAX) packet Take 17 g  by mouth daily as needed for moderate constipation.   . prochlorperazine (COMPAZINE) 10 MG tablet Take 10 mg by mouth daily as needed for nausea/vomiting.   . promethazine (PHENERGAN) 25 MG tablet Take 25 mg by mouth every 8 (eight) hours as needed for nausea or vomiting. Take one tablet by mouth every 8 hours as needed nausea/vomiting   . ranitidine (ZANTAC) 150 MG tablet Take 150 mg by mouth at bedtime.    . ranolazine (RANEXA) 500 MG 12 hr tablet Take 1 tablet (500 mg total) by mouth 2 (two) times daily.   . sitaGLIPtin (JANUVIA) 100 MG tablet Take 100 mg daily by mouth.   . traMADol (ULTRAM) 50 MG tablet Take 100 mg by mouth every 12 (twelve) hours as needed (pain).    No facility-administered encounter medications on file as of 07/17/2017.    Functional Status:   In your present state of health, do you have any difficulty performing the following activities: 06/14/2017 04/19/2017  Hearing? N N  Vision? Y N  Comment Reports cataracts -  Difficulty concentrating or making  decisions? N N  Walking or climbing stairs? Malvin JohnsY Y  Comment reports (L) BKA in 2016 -  Dressing or bathing? N N  Doing errands, shopping? Y N  Comment Reports family assists -  Quarry managerreparing Food and eating ? N N  Using the Toilet? N N  In the past six months, have you accidently leaked urine? N N  Do you have problems with loss of bowel control? N N  Managing your Medications? Y N  Comment Reports family assists with meds -  Managing your Finances? N Y  Housekeeping or managing your Housekeeping? Malvin JohnsY Y  Comment Reports family assists -  Some recent data might be hidden   Fall/Depression Screening:    Fall Risk  07/17/2017 06/26/2017 06/14/2017  Falls in the past year? (No Data) (No Data) Yes  Comment Patient denies new/ recent falls No new falls reported today -  Number falls in past yr: - - 2 or more  Injury with Fall? - - Yes  Risk Factor Category  - - High Fall Risk  Risk for fall due to : - - History of fall(s);Impaired vision;Impaired balance/gait;Impaired mobility  Follow up - Falls evaluation completed;Falls prevention discussed;Education provided Falls prevention discussed;Education provided   [facility] Sc[facility] 06/14/2017 04/19/2017 04/12/2017 05/03/2014 03/12/2013  PHQ - 2 Score 0 0 0 0 0   Plan:  Patient will take medications as prescribed and will attend all scheduled provider appointments   Patient will continue actively participating with home health services for wound care management  Patient will resume monitoring and recording blood sugars at least one time per day  Patient will review educational material provided him today re: self-health management of DM and will write down questions for further discussion  Patient will promptly notify care providers for any new concerns, issues, or problems that arise  [facility] Community CM ioutreach to continue with scheduled telephone call next week after cardiology appointment  [facility] Problem [facility]     Most Recent Value   Care Plan Problem One  Need for self-health management of chronic disease state of DM, as evidenced by patient reporting  Role Documenting the Problem One  Care Management Coordinator  Care Plan for Problem One  Active  [facility] Long Term Goal   Over the next 60 days, patient will be able to verbalize 3 self-health strategies for self- management of chronic disease state  of DM, as evidenced by patient reporting of same during [facility] RN CCM outreach  [facility] Long Term Goal Start Date  06/14/17  Interventions for Problem One Long Term Goal  Using teachback method, provided and discussed new printed EMMI educational material re: self-health management of DM around checking blood sugars regularly,  diabetes and infections, and how Type-2 DM can affect body,  discussed strategies around making blood sugar monitoring a part of patient's regular daily routine  [facility] CM Short Term Goal #1   Over the next 30 days, patient will monitor and record blood sugars at least one time per day, as evidenced by patient reporting and review of blood sugar values during [facility] RN CCM outreach  [facility] CM Short Term Goal #1 Start Date  07/17/17 [Goal re-established today]  Interventions for Short Term Goal #1  Reviewed with patient and caregiver all recently recorded blood sugars at home,  encouraged patient to continue to make monitoring and recording blood sugars a regular part of his daily routine     Caryl Pina, RN, BSN, [employer] Coordinator [facility] Care Management  [phone]

## 2017-07-24 ENCOUNTER — Encounter: Payer: Self-pay | Admitting: Cardiovascular Disease

## 2017-07-24 ENCOUNTER — Ambulatory Visit: Payer: PPO | Admitting: Cardiovascular Disease

## 2017-07-24 VITALS — BP 114/62 | HR 73 | Ht 75.0 in | Wt 305.0 lb

## 2017-07-24 DIAGNOSIS — E785 Hyperlipidemia, unspecified: Secondary | ICD-10-CM

## 2017-07-24 DIAGNOSIS — E1169 Type 2 diabetes mellitus with other specified complication: Secondary | ICD-10-CM | POA: Diagnosis not present

## 2017-07-24 DIAGNOSIS — I10 Essential (primary) hypertension: Secondary | ICD-10-CM | POA: Diagnosis not present

## 2017-07-24 NOTE — Assessment & Plan Note (Signed)
History of essential hypertension blood pressure 114/62. He is on amlodipine, metoprolol. Current meds at current dosing.

## 2017-07-24 NOTE — Patient Instructions (Signed)
Medication Instructions: Your physician recommends that you continue on your current medications as directed. Please refer to the Current Medication list given to you today.   Follow-Up: We request that you follow-up in: 6 months with Luke Kilroy, PA and in 12 months with Dr Berry  You will receive a reminder letter in the mail two months in advance. If you don't receive a letter, please call our office to schedule the follow-up appointment.  If you need a refill on your cardiac medications before your next appointment, please call your pharmacy.  

## 2017-07-24 NOTE — Assessment & Plan Note (Signed)
History of CAD status post LAD drug-eluting stent by myself in 2010 via the right radial approach. Because of non-STEMI he was admitted in October last year and underwent diagnostic coronary arteriography by myself revealing a patent LAD stent, EF of 50-55% with anteroapical wall motion amount, occluded dominant RCA origin left right collaterals and a high-grade calcified ostial nondominant circumflex which rose to 90 angle off the left main. He was seen in consultation by Dr. Lowella FairyGerhart felt that he was too high risk for bypass surgery recommended medical therapy. Since that time he's done well without significant chest pain.

## 2017-07-24 NOTE — Progress Notes (Signed)
07/24/2017 Samuel [name]   [birth year]  [id number]  Primary Physician [name], MD Primary Cardiologist: Runell Gess MD Nicholes Calamity, [place]  HPI:  Samuel [name] is a 67 y.o. mildly overweight divorced Caucasian male father of 3 (doesn't see his children) as well as on 2014. His history of CAD status post LAD stenting by myself in 2010 with a drug-eluting stent via the right radial approach. 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 I catheterized him on 04/08/17 revealing a patent LAD stent , occluded dominant RCA the origin with left right collaterals and a high-grade calcified ostial circumflex stenosis. 5 bypass surgery with this option over and he was turned down by Dr. Tyrone Sage because of comorbidities recommended medical therapy which has been effective since he was last seen by Corine Shelter 04/19/17.   Current Meds  Medication Sig  . acarbose (PRECOSE) 50 MG tablet Take 50 mg 3 (three) times daily with meals by mouth.  [name] amLODipine (NORVASC) 10 MG tablet Take 1 tablet (10 mg total) by mouth daily.  [name] apixaban (ELIQUIS) 5 MG TABS tablet Take 1 tablet (5 mg total) by mouth 2 (two) times daily.  [name] aspirin 81 MG EC tablet Take 1 tablet (81 mg total) by mouth daily.  [name] atorvastatin (LIPITOR) 80 MG tablet Take 1 tablet (80 mg total) by mouth daily at 6 PM.  . Canagliflozin (INVOKANA) 100 MG TABS Take 100 mg by mouth See admin instructions. invokana 100 mg daily. Alternating every other week between [place]  . clopidogrel (PLAVIX) 75 MG tablet Take 1 tablet (75 mg total) by mouth daily.  [name] docusate sodium (COLACE) 100 MG capsule Take 100 mg by mouth daily as needed (for constipation).   [name] empagliflozin (JARDIANCE) 25 MG TABS tablet Take 10  mg See admin instructions by mouth. Alternating every other week between [place]  . ferrous sulfate 325 (65 FE) MG tablet Take 325 mg by mouth every evening.   . furosemide (LASIX) 20 MG tablet Take 1 tablet (20 mg total) by mouth daily.  [name] gabapentin (NEURONTIN) 300 MG capsule Take 300 mg by mouth 3 (three) times daily.  [name] ibuprofen (ADVIL,MOTRIN) 200 MG tablet Take 400 mg every 6 (six) hours as needed by mouth for headache or pain.  [name] insulin detemir (LEVEMIR) 100 UNIT/ML injection Inject 85 Units into the skin 2 (two) times daily.   . insulin lispro (HUMALOG) 100 UNIT/ML injection Inject 0-10 Units into the skin 3 (three) times daily with meals. Inject SQ per sliding scale 150-250 (5 units), 251-350(8 units), 351-450(10 units), >450=10 units  . lansoprazole (PREVACID) 30 MG capsule Take 30 mg by mouth 2 (two) times daily.  . metoCLOPramide (REGLAN) 5 MG tablet Take 10 mg by mouth at bedtime.   . metoprolol tartrate (LOPRESSOR) 25 MG tablet Take 1 tablet (25 mg total) by mouth 2 (two) times daily.  . nitroGLYCERIN (NITROSTAT) 0.4 MG SL tablet Place 1 tablet (0.4 mg total) under the tongue every 5 (five) minutes as needed for chest pain.  . pioglitazone (ACTOS) 30 MG tablet Take 30 mg by mouth at bedtime.   . polyethylene glycol (MIRALAX / GLYCOLAX) packet Take 17 g by mouth daily as needed for moderate constipation.  . prochlorperazine (COMPAZINE) 10 MG  tablet Take 10 mg by mouth daily as needed for nausea/vomiting.  . promethazine (PHENERGAN) 25 MG tablet Take 25 mg by mouth every 8 (eight) hours as needed for nausea or vomiting. Take one tablet by mouth every 8 hours as needed nausea/vomiting  . ranitidine (ZANTAC) 150 MG tablet Take 150 mg by mouth at bedtime.   . ranolazine (RANEXA) 500 MG 12 hr tablet Take 1 tablet (500 mg total) by mouth 2 (two) times daily.  . sitaGLIPtin (JANUVIA) 100 MG tablet Take 100 mg daily by mouth.  . traMADol (ULTRAM) 50 MG tablet Take 100 mg by  mouth every 12 (twelve) hours as needed (pain).     Allergies  Allergen Reactions  . Gabapentin Hives, Itching and Rash    REACTION: rash/hives (per patient, was a reaction to an inactive ingredient in another mgf brand). The patient stated that he does take this medication now and it doesn't cause a rash any more.  . Nabumetone Itching, Nausea Only and Rash  . Codeine Other (See Comments)    GI UPSET    Social History   Socioeconomic History  . Marital status: Single    Spouse name: Not on file  . Number of children: Not on file  . Years of education: Not on file  . Highest education level: Not on file  Social Needs  . Financial resource strain: Not on file  . Food insecurity - worry: Not on file  . Food insecurity - inability: Not on file  . Transportation needs - medical: Not on file  . Transportation needs - non-medical: Not on file  Occupational History  . Occupation: DISABLITY    Employer: UNEMPLOYED  Tobacco Use  . Smoking status: Never Smoker  . Smokeless tobacco: Never Used  Substance and Sexual Activity  . Alcohol use: No    Frequency: Never    Comment: 01/07/2014 'might have a wine cooler a couple times/yr"  . Drug use: No  . Sexual activity: No  Other Topics Concern  . Not on file  Social History Narrative  . Not on file     Review of Systems: General: negative for chills, fever, night sweats or weight changes.  Cardiovascular: negative for chest pain, dyspnea on exertion, edema, orthopnea, palpitations, paroxysmal nocturnal dyspnea or shortness of breath Dermatological: negative for rash Respiratory: negative for cough or wheezing Urologic: negative for hematuria Abdominal: negative for nausea, vomiting, diarrhea, bright red blood per rectum, melena, or hematemesis Neurologic: negative for visual changes, syncope, or dizziness All other systems reviewed and are otherwise negative except as noted above.    Blood pressure 114/62, pulse 73, height 6'  3" (1.905 m), weight (!) 305 lb (138.3 kg).  General appearance: alert and no distress Neck: no adenopathy, no carotid bruit, no JVD, supple, symmetrical, trachea midline and thyroid not enlarged, symmetric, no tenderness/mass/nodules Lungs: clear to auscultation bilaterally Heart: regular rate and rhythm, S1, S2 normal, no murmur, click, rub or gallop Extremities: extremities normal, atraumatic, no cyanosis or edema Pulses: 2+ and symmetric Skin: Skin color, texture, turgor normal. No rashes or lesions Neurologic: Alert and oriented X 3, normal strength and tone. Normal symmetric reflexes. Normal coordination and gait  EKG normal sinus rhythm at 73 without ST or T-wave changes. I personally reviewed this EKG.  ASSESSMENT AND PLAN:   CAD S/P DES PCI to mLAD: Xience DES 2.75 x 15 (3.0 mm) History of CAD status post LAD drug-eluting stent by myself in 2010 via the right radial approach.  Because of non-STEMI he was admitted in October last year and underwent diagnostic coronary arteriography by myself revealing a patent LAD stent, EF of 50-55% with anteroapical wall motion amount, occluded dominant RCA origin left right collaterals and a high-grade calcified ostial nondominant circumflex which rose to 90 angle off the left main. He was seen in consultation by Dr. Lowella FairyGerhart felt that he was too high risk for bypass surgery recommended medical therapy. Since that time he's done well without significant chest pain.  Essential hypertension History of essential hypertension blood pressure 114/62. He is on amlodipine, metoprolol. Current meds at current dosing.  Hyperlipidemia due to type 2 diabetes mellitus (HCC) History of hyperlipidemia on high-dose statin therapy with recent lipid profile performed 04/08/17 revealing total cholesterol 140, LDL 89 and HDL 32.  Pulmonary embolism (HCC) History of pulmonary and was in the past on Eliquis  oral anticoagulation.      Runell GessJonathan J. Lynell Kussman MD  FACP,FACC,FAHA, [facility] 07/24/2017 1:49 PM

## 2017-07-24 NOTE — Assessment & Plan Note (Signed)
History of hyperlipidemia on high-dose statin therapy with recent lipid profile performed 04/08/17 revealing total cholesterol 140, LDL 89 and HDL 32.

## 2017-07-24 NOTE — Assessment & Plan Note (Signed)
History of pulmonary and was in the past on Eliquis  oral anticoagulation.

## 2017-07-26 ENCOUNTER — Other Ambulatory Visit: Payer: Self-pay | Admitting: *Deleted

## 2017-07-26 ENCOUNTER — Encounter: Payer: Self-pay | Admitting: *Deleted

## 2017-07-26 NOTE — Patient Outreach (Signed)
[facility]) Care Management [facility] CM Telephone Outreach  07/26/2017  Samuel BusingJohn D Cody December 20, 1950 [id number]  Unsuccessful telephone outreach to Samuel BusingJohn D [name] is a 67 y.o. male [facility] for [facility] of care after hospitalization October 21-25, 2018 for NSTEMI. Patient completed transition of care without hospital re-admission. Patient has history including, but not limited to, CAD, DM, (L) BKA in 2016, HTN/ HLD, GERD, OSA, and gastroparesis.   HIPAA compliant voice mail message left for patient, requesting return call back, making patient aware that I would re-attempt call next Monday 07/29/17.  Plan:  Will re-attempt [facility] Community CM telephone outreach next week on Monday 07/29/17 if I do not hear back from patient first.  Caryl PinaLaine Mckinney Coriann Brouhard, RN, BSN, CCRN [facility] Community Care Coordinator [facility] Care Management  [phone]

## 2017-07-29 ENCOUNTER — Encounter: Payer: Self-pay | Admitting: *Deleted

## 2017-07-29 ENCOUNTER — Other Ambulatory Visit: Payer: Self-pay | Admitting: *Deleted

## 2017-07-29 NOTE — Patient Outreach (Signed)
[facility]Health[facility] Network [facility]) Care Management [facility] CM Telephone Outreach Second unsuccessful call attempt  07/29/2017  Samuel BusingJohn D Alan May 02, 1951 [id number]  11:15 am:  Second (successive) unsuccessful telephone outreach attempt to Samuel [name], 67 y.o. male [facility] for [facility] of care after hospitalization October 21-25, 2018 for NSTEMI. Patient completed transition of care without hospital re-admission. Patient has history including, but not limited to, CAD, DM, (L) BKA in 2016, HTN/ HLD, GERD, OSA, and gastroparesis.   HIPAA compliant voice mail message left for patient, requesting return call back.  Plan:  Will place [facility] CM case closure letter in the mail to patient, requesting call back if I do not hear back from patient later today  If no call-back within 10 business days of sending letter, will re-attempt phone call to patient at that time, and will proceed to case closure if unsuccessful, per [facility] workflow standard  Samuel PinaLaine Mckinney Jassmine Vandruff, RN, BSN, CCRN [facility] Community Care Coordinator [facility] Care Management  [phone]

## 2017-07-31 ENCOUNTER — Other Ambulatory Visit: Payer: Self-pay | Admitting: *Deleted

## 2017-07-31 NOTE — Patient Outreach (Signed)
[facility] Customer service manager [facility]) Care Management [facility] Community CM Telephone Outreach  07/31/2017  FREEMON BINFORD [birth year] [id number]  Received off-hours voice mail message from patient's caregiver [name], on [facility] CM written consent, re: TIRON SUSKI, 67 y.o. male originally referred to [facility] Community CM for transition of care after hospitalization October 21-25, 2018 for NSTEMI. Patient completed transition of care without hospital re-admission. Patient has history including, but not limited to, CAD, DM, (L) BKA in 2016, HTN/ HLD, GERD, OSA, and gastroparesis.   Returned call to patient/ caregiver this morning; spoke with Sedalia Muta, who reports that patient is unavailable to talk on phone; HIPAA/ identity verified with caregiver.  [name] reports today that she was calling as a result of my letter from earlier this week; stated that she and patient did not want to close [facility] Community CM case untl next [facility] RN CCM home visit occurred for review of patient's recorded blood sugars.  Explained to [name] that since she had contacted me back, we would not close case.  [name] reports that patient has "had some good days and some bad days," and states that he continues to deal with chronic gastroparesis, "his biggest daily problem."  States that patient had "a good day yesterday, but not so good this morning," stating he is having his (normal) nausea.  [name] denies that patient is in distress.  [name] states that patient has continued monitoring and recording blood sugars regularly; that home health nurse continues coming for leg wound checks; states patient's wound "is about completely healed."  Reports that patient obtained insulin samples from his PCP as he planned during (her) PCP office visit 07/17/17.  Denies issues/ questions/ concerns around patient's medications.  Discussed with [name] that I would contact patient next week to schedule next [facility] Community RN CM home visit, most likely for case closure, and she is  agreeable with this plan; states she will share my call today with patient.  Caregiver denies further issues, concerns, or problems today.  I confirmed that caregiver/ patient have my direct phone number, the main [facility] CM office phone number, and the [facility] CM 24-hour nurse advice phone number should issues arise prior to next scheduled [facility] Community CM outreach by phone next week.  Plan:  Patient will take medications as prescribed and will attend all scheduled provider appointments   Patient will continue actively participating with home health services for wound care management  Patient willresumemonitoring and recording blood sugars at least one time per day  Patient will promptly notify care providers for any new concerns, issues, or problems that arise  [facility] Community CM outreach tocontinue with scheduledtelephone call next week to schedule case closure home visit   [facility] CM Care Plan Problem One     Most Recent Value  Care Plan Problem One  Need for self-health management of chronic disease state of DM, as evidenced by patient reporting  Role Documenting the Problem One  Care Management Coordinator  Care Plan for Problem One  Active  [facility] Long Term Goal   Over the next 60 days, patient will be able to verbalize 3 self-health strategies for self- management of chronic disease state of DM, as evidenced by patient reporting of same during [facility] RN CCM outreach  [facility] Long Term Goal Start Date  06/14/17  Interventions for Problem One Long Term Goal  Discussed with patient's caregiver patient's current clinical status, current eating habits, use of insulin,  ensured patient had received insulin samples as he planned to  at time of last [facility]CM [facility]  [facility] CM Short Term Goal #1   Over the next 30 days, patient will monitor and record blood sugars at least one time per day, as evidenced by patient reporting and review of blood sugar values during [facility] outreach  [facility] CM Short Term Goal  #1 Start Date  07/17/17 [Goal re-established today]  Interventions for Short Term Goal #1  Discussed with patient's caregiver  patient's practices aorund blood sugar monitoring,  confirmed that patient has continued monitoring and recording blood sugars regularly     Caryl PinaLaine Mckinney Tousey, RN, BSN, [facility] Community Care Coordinator [facility] Care Management  [phone]

## 2017-08-02 ENCOUNTER — Encounter (HOSPITAL_COMMUNITY): Payer: Self-pay

## 2017-08-02 ENCOUNTER — Emergency Department (HOSPITAL_COMMUNITY): Payer: PPO

## 2017-08-02 ENCOUNTER — Emergency Department (HOSPITAL_COMMUNITY)
Admission: EM | Admit: 2017-08-02 | Discharge: 2017-08-02 | Disposition: A | Discharge status: Home / Self Care | Payer: PPO | Attending: Emergency Medicine | Admitting: Emergency Medicine

## 2017-08-02 ENCOUNTER — Other Ambulatory Visit: Payer: Self-pay

## 2017-08-02 DIAGNOSIS — Z7901 Long term (current) use of anticoagulants: Secondary | ICD-10-CM | POA: Diagnosis not present

## 2017-08-02 DIAGNOSIS — Z7902 Long term (current) use of antithrombotics/antiplatelets: Secondary | ICD-10-CM | POA: Insufficient documentation

## 2017-08-02 DIAGNOSIS — Z7982 Long term (current) use of aspirin: Secondary | ICD-10-CM | POA: Insufficient documentation

## 2017-08-02 DIAGNOSIS — E1040 Type 1 diabetes mellitus with diabetic neuropathy, unspecified: Secondary | ICD-10-CM | POA: Insufficient documentation

## 2017-08-02 DIAGNOSIS — I252 Old myocardial infarction: Secondary | ICD-10-CM | POA: Diagnosis not present

## 2017-08-02 DIAGNOSIS — I10 Essential (primary) hypertension: Secondary | ICD-10-CM | POA: Insufficient documentation

## 2017-08-02 DIAGNOSIS — I2510 Atherosclerotic heart disease of native coronary artery without angina pectoris: Secondary | ICD-10-CM | POA: Insufficient documentation

## 2017-08-02 DIAGNOSIS — R112 Nausea with vomiting, unspecified: Secondary | ICD-10-CM | POA: Diagnosis not present

## 2017-08-02 DIAGNOSIS — J449 Chronic obstructive pulmonary disease, unspecified: Secondary | ICD-10-CM | POA: Insufficient documentation

## 2017-08-02 DIAGNOSIS — K3184 Gastroparesis: Secondary | ICD-10-CM | POA: Insufficient documentation

## 2017-08-02 DIAGNOSIS — R109 Unspecified abdominal pain: Secondary | ICD-10-CM | POA: Diagnosis not present

## 2017-08-02 LAB — CBC WITH DIFFERENTIAL/PLATELET
Basophils Absolute: 0 10*3/uL (ref 0.0–0.1)
Basophils Relative: 0 %
EOS ABS: 0 10*3/uL (ref 0.0–0.7)
EOS PCT: 0 %
HCT: 41.8 % (ref 39.0–52.0)
Hemoglobin: 14.4 g/dL (ref 13.0–17.0)
LYMPHS ABS: 1.3 10*3/uL (ref 0.7–4.0)
Lymphocytes Relative: 15 %
MCH: 31.6 pg (ref 26.0–34.0)
MCHC: 34.4 g/dL (ref 30.0–36.0)
MCV: 91.7 fL (ref 78.0–100.0)
MONO ABS: 0.4 10*3/uL (ref 0.1–1.0)
Monocytes Relative: 4 %
Neutro Abs: 7.1 10*3/uL (ref 1.7–7.7)
Neutrophils Relative %: 81 %
PLATELETS: 286 10*3/uL (ref 150–400)
RBC: 4.56 MIL/uL (ref 4.22–5.81)
RDW: 13.3 % (ref 11.5–15.5)
WBC: 8.8 10*3/uL (ref 4.0–10.5)

## 2017-08-02 LAB — COMPREHENSIVE METABOLIC PANEL
ALT: 15 U/L — AB (ref 17–63)
ANION GAP: 16 — AB (ref 5–15)
AST: 24 U/L (ref 15–41)
Albumin: 3.9 g/dL (ref 3.5–5.0)
Alkaline Phosphatase: 95 U/L (ref 38–126)
BUN: 25 mg/dL — ABNORMAL HIGH (ref 6–20)
CHLORIDE: 102 mmol/L (ref 101–111)
CO2: 23 mmol/L (ref 22–32)
CREATININE: 1.25 mg/dL — AB (ref 0.61–1.24)
Calcium: 9.7 mg/dL (ref 8.9–10.3)
GFR, EST NON AFRICAN AMERICAN: 59 mL/min — AB (ref >60)
Glucose, Bld: 207 mg/dL — ABNORMAL HIGH (ref 65–99)
POTASSIUM: 4.5 mmol/L (ref 3.5–5.1)
SODIUM: 141 mmol/L (ref 135–145)
Total Bilirubin: 0.8 mg/dL (ref 0.3–1.2)
Total Protein: 8 g/dL (ref 6.5–8.1)

## 2017-08-02 LAB — LIPASE, BLOOD: LIPASE: 18 U/L (ref 11–51)

## 2017-08-02 LAB — I-STAT TROPONIN, ED: TROPONIN I, POC: 0 ng/mL (ref 0.00–0.08)

## 2017-08-02 MED ORDER — SODIUM CHLORIDE 0.9 % IV BOLUS (SEPSIS)
1000.0000 mL | Freq: Once | INTRAVENOUS | Status: AC
  Administered 2017-08-02: 1000 mL via INTRAVENOUS

## 2017-08-02 MED ORDER — ONDANSETRON HCL 4 MG/2ML IJ SOLN
4.0000 mg | Freq: Once | INTRAMUSCULAR | Status: DC
  Filled 2017-08-02: qty 2

## 2017-08-02 MED ORDER — PROMETHAZINE HCL 25 MG/ML IJ SOLN
25.0000 mg | Freq: Once | INTRAMUSCULAR | Status: AC
  Administered 2017-08-02: 25 mg via INTRAVENOUS
  Filled 2017-08-02: qty 1

## 2017-08-02 NOTE — ED Triage Notes (Signed)
Pt BIB EMS from home- EMS reports a gastroparesis flare up. N/V since yesterday afternoon. No meds taken today.

## 2017-08-02 NOTE — ED Provider Notes (Signed)
[facility]-EMERGENCY DEPT Provider Note   CSN: [id number] Arrival date & time: 08/02/17  1622     History   Chief Complaint Chief Complaint  Patient presents with  . Emesis    HPI Beckie BusingJohn D Frayne is a 67 y.o. male history of CAD s/p MI, gastroparesis, DM, here presenting with vomiting.  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  Patient is on Compazine and Phenergan as needed.  Patient states that he is chronically nauseated and since yesterday, he has numerous episodes of bilious vomiting.  Denies any fevers or chills or abdominal pain.  Patient was seen here several months ago for vomiting and was given Phenergan and felt better was able to go home.   The history is provided by the patient.    Past Medical History:  Diagnosis Date  . Anxiety   . Arthritis    "all over"   . CAD (coronary artery disease)   . Charcot's joint    "left foot"  . 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 Depression   . Gastroparesis   . GERD (gastroesophageal reflux disease)   . H/O hiatal hernia   . Hyperlipidemia   . Hypertension   . Myocardial infarction (HCC) 2017/03/27   around this date  . OSA (obstructive sleep apnea)    "not bad enough for a mask"  . Peripheral neuropathy   . Peripheral vascular disease (HCC)   . PONV (postoperative nausea and vomiting)   . Pulmonary embolism (HCC)    hx. of 2012  . Shortness of breath    exertion  . Type II diabetes mellitus [facility](HCC)     Patient Active Problem List   Diagnosis Date Noted  . S/P BKA (below knee amputation) (HCC) 04/19/2017  . COPD (chronic obstructive pulmonary disease) (HCC) 04/19/2017  . NSTEMI (non-ST elevated myocardial infarction) (HCC) 04/07/2017  . Osteomyelitis of foot (HCC)   . Protein-calorie malnutrition, severe (HCC) 03/08/2015  . Osteomyelitis of foot, acute (HCC) 03/07/2015  . Sepsis (HCC) 03/07/2015  . Diabetes mellitus type 2, uncontrolled (HCC) 03/07/2015    . Normocytic normochromic anemia 03/07/2015  . Obesity (BMI 30-39.9) 03/07/2015  . Nausea & vomiting   . DM (diabetes mellitus), type 2 (HCC) 01/07/2014  . Chronic osteomyelitis involving lower leg (HCC) 01/07/2014  . Vomiting 12/22/2013  . Gastroparesis 12/21/2013  . Intractable nausea and vomiting 12/21/2013  . Essential hypertension 05/01/2013  . Hyperlipidemia due to type 2 diabetes mellitus (HCC) 05/01/2013  . Generalized weakness 03/20/2013  . Back pain 03/20/2013  . Pulmonary embolism (HCC) 03/27/2011  . PERFORATION OF GALLBLADDER 08/30/2010  . Ulcer of lower limb, unspecified 03/01/2010  . METHICILLIN SUSCEPTIBLE STAPH AUREUS SEPTICEMIA 01/25/2010  . Acute osteomyelitis, ankle and foot 01/25/2010  . NAUSEA AND VOMITING 01/25/2010  . GERD 11/09/2009  . CAD S/P DES PCI to mLAD: Xience DES 2.75 x 15 (3.0 mm) 05/01/2009  . DIABETES, TYPE 1 05/14/2008  . OBSTRUCTIVE SLEEP APNEA 04/30/2008  . ALLERGIC RHINITIS, SEASONAL 04/30/2008    Past Surgical History:  Procedure Laterality Date  . AMPUTATION Left 03/08/2015   Procedure:  LEFT AMPUTATION BELOW KNEE;  Surgeon: Toni ArthursJohn Hewitt, MD;  Location: [facility] ORS;  Service: Orthopedics;  Laterality: Left;  . APPLICATION OF WOUND VAC Left 01/07/2014  . CHOLECYSTECTOMY  06/2010  . CORONARY ANGIOPLASTY WITH STENT PLACEMENT  05/2009   "1"  . FOOT SURGERY Left 2010   "for Charcot's joint"  . HARDWARE REMOVAL Left 01/07/2014  Procedure: LEFT LEG REMOVAL OF DEEP IMPLANT AND SEQUESTRECTOMY; APPLICATION OF WOUND VAC ;  Surgeon: Toni Arthurs, MD;  Location: [facility] OR;  Service: Orthopedics;  Laterality: Left;  . I&D EXTREMITY Left "multiple"   leg  . 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 IM NAILING TIBIA Left ~ 2012  . LEFT HEART CATH AND CORONARY ANGIOGRAPHY N/A 04/08/2017   Procedure: LEFT HEART CATH AND  CORONARY ANGIOGRAPHY;  Surgeon: Runell Gess, MD;  Location: [facility] INVASIVE CV LAB;  Service: Cardiovascular;  Laterality: N/A;  . SHOULDER ARTHROSCOPY W/ ROTATOR CUFF REPAIR Left    "and bone spurs"  . TIBIAL IM ROD REMOVAL Left 01/07/2014  . TOE AMPUTATION Right ~ 2011   "great toe"  . VENA CAVA FILTER PLACEMENT  2012  . VENA CAVA FILTER PLACEMENT N/A 11/20/2016   Procedure: INSERTION VENA-CAVA FILTER;  Surgeon: Sherren Kerns, MD;  Location: [facility];  Service: Vascular;  Laterality: N/A;  . WOUND DEBRIDEMENT Left 01/07/2014   "tibia"       Home Medications    Prior to Admission medications   Medication Sig Start Date End Date Taking? Authorizing Provider  acarbose (PRECOSE) 50 MG tablet Take 50 mg 3 (three) times daily with meals by mouth.   Yes [name], MD  amLODipine (NORVASC) 10 MG tablet Take 1 tablet (10 mg total) by mouth daily. 11/22/16  Yes Richarda Overlie, MD  apixaban (ELIQUIS) 5 MG TABS tablet Take 1 tablet (5 mg total) by mouth 2 (two) times daily. 04/11/17 04/11/18 Yes Glade Lloyd, MD  aspirin 81 MG EC tablet Take 1 tablet (81 mg total) by mouth daily. 04/12/17  Yes Glade Lloyd, MD  atorvastatin (LIPITOR) 80 MG tablet Take 1 tablet (80 mg total) by mouth daily at 6 PM. 04/11/17  Yes Alekh, Kshitiz, MD  Canagliflozin (INVOKANA) 100 MG TABS Take 100 mg by mouth See admin instructions. invokana 100 mg daily. Alternating every other week between Jardiance and Invokana   Yes [provider]  clopidogrel (PLAVIX) 75 MG tablet Take 1 tablet (75 mg total) by mouth daily. 04/12/17  Yes Glade Lloyd, MD  diphenhydramine-acetaminophen (TYLENOL PM) 25-500 MG TABS tablet Take 1 tablet by mouth at bedtime.   Yes [provider]  empagliflozin (JARDIANCE) 25 MG TABS tablet Take 10 mg See admin instructions by mouth. Alternating every other week between Jardiance and Invokana   Yes [provider]  ferrous sulfate 325 (65 FE) MG tablet Take 325 mg by  mouth every evening.    Yes [provider]  furosemide (LASIX) 20 MG tablet Take 1 tablet (20 mg total) by mouth daily. Patient taking differently: Take 20 mg by mouth 2 (two) times daily.  04/12/17  Yes Glade Lloyd, MD  gabapentin (NEURONTIN) 300 MG capsule Take 300 mg by mouth 3 (three) times daily.   Yes [provider]  insulin detemir (LEVEMIR) 100 UNIT/ML injection Inject 85 Units into the skin 2 (two) times daily.    Yes [provider]  insulin lispro (HUMALOG) 100 UNIT/ML injection Inject 0-10 Units into the skin 3 (three) times daily with meals. Inject SQ per sliding scale 150-250 (5 units), 251-350(8 units), 351-450(10 units), >450=10 units   Yes [provider]  lansoprazole (PREVACID) 30 MG capsule Take 30 mg by mouth 2 (two) times daily. 03/19/17  Yes [provider]  metoCLOPramide (REGLAN) 5 MG tablet Take 10 mg by mouth at bedtime.    Yes [provider]  metoprolol tartrate (LOPRESSOR) 25 MG tablet Take 1 tablet (25 mg total) by mouth 2 (two) times daily. 04/11/17  Yes Glade Lloyd, MD  pioglitazone (ACTOS) 30 MG tablet Take 30 mg by mouth at bedtime.    Yes [provider]  prochlorperazine (COMPAZINE) 10 MG tablet Take 10 mg by mouth daily as needed for nausea/vomiting. 04/01/17  Yes [provider]  promethazine (PHENERGAN) 25 MG tablet Take 25 mg by mouth every 8 (eight) hours as needed for nausea or vomiting. Take one tablet by mouth every 8 hours as needed nausea/vomiting   Yes [provider]  ranitidine (ZANTAC) 150 MG tablet Take 150 mg by mouth at bedtime.    Yes [provider]  ranolazine (RANEXA) 500 MG 12 hr tablet Take 1 tablet (500 mg total) by mouth 2 (two) times daily. 04/11/17  Yes Glade Lloyd, MD  sitaGLIPtin (JANUVIA) 100 MG tablet Take 100 mg daily by mouth.   Yes [name], MD  traMADol (ULTRAM) 50 MG tablet Take 100 mg by mouth every 12 (twelve) hours as needed  (pain).   Yes [provider]  docusate sodium (COLACE) 100 MG capsule Take 100 mg by mouth daily as needed (for constipation).     [provider]  ibuprofen (ADVIL,MOTRIN) 200 MG tablet Take 400 mg every 6 (six) hours as needed by mouth for headache or pain.    [provider]  nitroGLYCERIN (NITROSTAT) 0.4 MG SL tablet Place 1 tablet (0.4 mg total) under the tongue every 5 (five) minutes as needed for chest pain. 04/11/17   Glade Lloyd, MD  polyethylene glycol (MIRALAX / GLYCOLAX) packet Take 17 g by mouth daily as needed for moderate constipation. 04/11/17   Glade Lloyd, MD    Family History Family History  Problem Relation Age of Onset  . COPD Mother   . Heart disease Mother   . Lung cancer Mother   . Retinal detachment Father   . Alcoholism Brother   . Heart disease Brother   . COPD Brother   . Diabetes Brother   . Hypertension Brother   . Stroke Brother     Social History Social History   Tobacco Use  . Smoking status: Never Smoker  . Smokeless tobacco: Never Used  Substance Use Topics  . Alcohol use: No    Frequency: Never    Comment: 01/07/2014 'might have a wine cooler a couple times/yr"  . Drug use: No     Allergies   Gabapentin; Nabumetone; and Codeine   Review of Systems Review of Systems  Gastrointestinal: Positive for vomiting.  All other systems reviewed and are negative.    Physical Exam Updated Vital Signs BP (!) 145/81   Pulse 99   Temp 98 F (36.7 C) (Oral)   Resp (!) 28   Ht 6\' 3"  (1.905 m)   Wt (!) 138.3 kg (305 lb)   SpO2 97%   BMI 38.12 kg/m   Physical Exam  Constitutional: He is oriented to person, place, and time.  Obese, dehydrated   HENT:  Head: Normocephalic.  MM dry   Eyes: Conjunctivae and EOM are normal. Pupils are equal, round, and reactive to light.  Neck: Normal range of motion. Neck supple.  Cardiovascular: Normal rate, regular rhythm and normal heart sounds.  Pulmonary/Chest:  Effort normal and breath sounds normal. No stridor. No respiratory  distress. He has no wheezes.  Abdominal: Soft. Bowel sounds are normal. He exhibits no distension. There is no tenderness.  Mild epigastric tenderness, no rebound   Musculoskeletal: Normal range of motion.  Neurological: He is alert and oriented to person, place, and time. No cranial nerve deficit. Coordination normal.  Skin: Skin is warm.  Psychiatric: He has a normal mood and affect.  Nursing note and vitals reviewed.    ED Treatments / Results  Labs (all labs ordered are listed, but only abnormal results are displayed) Labs Reviewed  COMPREHENSIVE METABOLIC PANEL - Abnormal; Notable for the following components:      Result Value   Glucose, Bld 207 (*)    BUN 25 (*)    Creatinine, Ser 1.25 (*)    ALT 15 (*)    GFR calc non Af Amer 59 (*)    Anion gap 16 (*)    All other components within normal limits  CBC WITH DIFFERENTIAL/PLATELET  LIPASE, BLOOD  URINALYSIS, ROUTINE W REFLEX MICROSCOPIC  I-STAT TROPONIN, ED    EKG  EKG Interpretation  Date/Time:  Friday August 02 2017 17:22:01 EST Ventricular Rate:  91 PR Interval:    QRS Duration: 94 QT Interval:  410 QTC Calculation: 505 R Axis:   36 Text Interpretation:  Sinus rhythm Borderline low voltage, extremity leads Abnormal R-wave progression, early transition Minimal ST elevation, inferior leads Prolonged QT interval Baseline wander in lead(s) V6 No significant change since last tracing Confirmed by Richardean Canal [phone]) on 08/02/2017 5:46:08 PM       Radiology Dg Abd Acute W/chest  Result Date: 08/02/2017 CLINICAL DATA:  67 year old male with abdominal pain and vomiting. EXAM: DG ABDOMEN ACUTE W/ 1V CHEST COMPARISON:  Chest radiographs 05/19/2017 and earlier. Abdominal radiographs 11/20/2016. FINDINGS: Supine views of the chest abdomen and pelvis were obtained. The patient declined decubitus imaging. No definite pneumoperitoneum on these supine views.  Low lung volumes are stable. Mediastinal contours remain normal. No pneumothorax, pulmonary edema, pleural effusion or confluent pulmonary opacity. Stable cholecystectomy clips. Stable IVC filter projecting to the right of the lumbar spine. Non obstructed bowel gas pattern. Abdominal and pelvic visceral contours are stable and within normal limits. No acute osseous abnormality identified. IMPRESSION: 1. Normal bowel gas pattern. The patient declined decubitus imaging, no definite pneumoperitoneum on these supine views. 2. Low lung volumes.  No acute cardiopulmonary abnormality. Electronically Signed   By: Odessa Fleming M.D.   On: 08/02/2017 18:25    Procedures Procedures (including critical care time)  Angiocath insertion Performed by: Richardean Canal  Consent: Verbal consent obtained. Risks and benefits: risks, benefits and alternatives were discussed Time out: Immediately prior to procedure a "time out" was called to verify the correct patient, procedure, equipment, support staff and site/side marked as required.  Preparation: Patient was prepped and draped in the usual sterile fashion.  Vein Location: L antecube  Ultrasound Guided  Gauge: 20 long   Normal blood return and flush without difficulty Patient tolerance: Patient tolerated the procedure well with no immediate complications.     Medications Ordered in ED Medications  ondansetron (ZOFRAN) injection 4 mg (not administered)  sodium chloride 0.9 % bolus 1,000 mL (1,000 mLs Intravenous New Bag/Given 08/02/17 1901)  promethazine (PHENERGAN) injection 25 mg (25 mg Intravenous Given 08/02/17 1904)     Initial Impression / Assessment and Plan / ED Course  I have reviewed the triage vital signs and the nursing notes.  Pertinent labs & imaging results that  were available during my care of the patient were reviewed by me and considered in my medical decision making (see chart for details).     MARLAND REINE is a 67 y.o. male here with  vomiting. Has hx of gastroparesis. Abdomen nontender and low suspicion for SBO. Will get labs, acute abdominal series. Will hydrate patient.   8:21 PM Xray showed no SBO. Glucose 200, AG 16 but likely from dehydration and I doubt DKA. No vomiting after phenergan. Has phenergan at home. Will dc home.    Final Clinical Impressions(s) / ED Diagnoses   Final diagnoses:  None    ED Discharge Orders    None       Charlynne Pander, MD 08/02/17 2021

## 2017-08-02 NOTE — Discharge Instructions (Signed)
Continue your phenergan at home.   See your doctor  Return to ER if you have worse vomiting, fever, severe abdominal pain

## 2017-08-07 ENCOUNTER — Other Ambulatory Visit: Payer: Self-pay | Admitting: *Deleted

## 2017-08-07 ENCOUNTER — Encounter: Payer: Self-pay | Admitting: *Deleted

## 2017-08-07 NOTE — Patient Outreach (Signed)
[facility] CM Telephone Outreach  08/07/2017  Samuel BusingJohn D Simoni 1951-03-15 [id number]  Unsuccessful telephone outreach attempt to Samuel BusingJohn D [name] is a 67 y.o. male [facility] of care after hospitalization October 21-25, 2018 for NSTEMI. Patient completed transition of care without hospital re-admission. Patient has history including, but not limited to, CAD, DM, (L) BKA in 2016, HTN/ HLD, GERD, OSA, and gastroparesis.   HIPAA compliant voice mail message left for patient, requesting return call back, also letting know patient I would re-attempt call tomorrow if I did not hear back form him today.  Plan:  Will re-attempt [facility] Community CM telephone outreach again tomorrow if I do not hear back from patient later today.  Caryl PinaLaine Mckinney Maleeha Halls, RN, BSN, [employer] Community Care Coordinator [facility] Care Management  [phone]

## 2017-08-08 ENCOUNTER — Other Ambulatory Visit: Payer: Self-pay | Admitting: *Deleted

## 2017-08-08 ENCOUNTER — Encounter: Payer: Self-pay | Admitting: *Deleted

## 2017-08-08 NOTE — Patient Outreach (Signed)
Samuel [name]) Care Management Samuel [name]  08/08/2017  Samuel [name] [birth year] [id number]  Successful telephone [name] to Samuel [name], 66 y.o.maleoriginally referred to Samuel [name] for transition of care after hospitalization October 21-25, 2018 for NSTEMI. Patient completed transition of care without hospital re-admission. Patient has history including, but not limited to, CAD, DM, (L) BKA in 2016, HTN/ HLD, GERD, OSA, and gastroparesis. HIPAA/ identity verified with patient during phone call today.  Today, patient reports he is "having a good day."  Acknowledges recent ED visit 08/02/17 for chronic gastroparesis; patient states that his nausea/ vomiting became "so bad" that he was worried he was dehydrated, and felt he needed medication that can 'only be provided by the hospital," since he was unable to keep any of his medications/ fluids down between spells of vomiting.  Patient sounds to be in no obvious/ apparent distress throughout entirety of today's phone call.  Patient further reports: -- leg wounds "have completely healed now."  States that home health nurse "just left-- they released me." -- has continue monitoring and recording blood sugars "regularly," although "not every single day."  Reports ranges most consistently "between 175-200."  States he is currently not near his recorded blood sugars, and that he would like to schedule home visit for [facility] RN CCm review of blood sugars; this was scheduled today.   Encouraged patient to continue trying to consistently make daily blood sugar checks a part of his routine, and confirmed that patient is able to verbalize self-health management strategies around chronic disease state of DM:  "eat the right foods; check my blood sugars more routinely; and take my medicines like I am supposed to."  Patient is also able to verbalize importance of monitoring and keeping up with A1-C levels. -- has  completed monitoring and recording of weights "a couple of times every week."  Reports last recorded weight of 297 lbs; reports that he forgot to ask cardiologist about whether or not he has a diagnosis of CHF, and whether or not he should be monitoring / recording daily weights.  Patient states, "I figure it can't hurt," and states that he plans to continue monitoring/ recording daily weights "at least a couple of times" every week.  -- denies questions/ concerns/ recent changes around medications  Patient denies further issues, concerns, or problems today.  I confirmed that patient has my direct phone number, the main [facility] CM office phone number, and the [name] CM 24-hour nurse advice phone number should issues arise prior to next scheduled Samuel [name] [name].  Encouraged patient to contact me directly if needs, questions, issues, or concerns arise prior to next scheduled [name]; patient agreed to do so.  We again discussed that patient has thus far at least partially  Met his previously established Samuel [name] CM goals, and that next home visit (scheduled today) would most likely be for case closure; patient verbalizes understanding and agreement with this general plan.  Plan:  Patient will take medications as prescribed and will attend all scheduled provider appointments   Patient willcontinue monitoring and recording blood sugars several times each week  Patient will promptly notify care providers for any Samuel concerns, issues, or problems that arise  Samuel [name] [name] tocontinue with scheduledroutine home visit, most likely for case closure  Samuel [name] CM Care Plan Problem One     Most Recent Value  Care Plan Problem One  Need for self-health management of chronic disease state of  DM, as evidenced by patient reporting  Role Documenting the Problem One  Care Management Samuel [name] for Problem One  Active  [facility] Long Term Goal   Over the next 60 days, patient will be  able to verbalize 3 self-health strategies for self- management of chronic disease state of DM, as evidenced by patient reporting of same during [place] [name]  Samuel [name] Term Goal Start Date  06/14/17  [name] Long Term Goal Met Date  08/08/17- Goal met   [facility] CM Short Term Goal #1   Over the next 20 days, patient will monitor and record blood sugars one time per day, as evidenced by patient reporting and review of blood sugar values during [facility] RN CCM [name]  [name] CM Short Term Goal #1 Start Date  08/08/17 [Goal re-established/ modified today ]  Interventions for Short Term Goal #1  Discussed with patient his progress around blood sugar monitoring,  confirmed that patient has continued monitoring and recording blood sugars regularly, and scheduled [facility] CCM RN home visit to review blood sugars in person, per patient request,  discussed possibility of [facility] RN CCM case closure with patient,  encouraged patient to make effort to consistently make blood sugar monitoring a part of his daily routine     Oneta Rack, RN, BSN, [employer] Coordinator [name] Care Management  [phone]

## 2017-08-09 ENCOUNTER — Ambulatory Visit: Payer: Self-pay | Admitting: *Deleted

## 2017-08-27 ENCOUNTER — Encounter: Payer: Self-pay | Admitting: *Deleted

## 2017-08-27 ENCOUNTER — Other Ambulatory Visit: Payer: Self-pay | Admitting: *Deleted

## 2017-08-27 NOTE — Patient Outreach (Signed)
[facility]) Care Management  [facility] Community CM routine Home Visit-- referral to [facility] RN Health Coach  08/27/2017  Samuel [name] [birth year] [id number]  Samuel [name] is a 67 y.o. male originally referred to [facility] Community CM for transition of care after hospitalization October 21-25, 2018 for NSTEMI. Patient completed transition of care without hospital re-admission. Patient has history including, but not limited to, CAD, DM, (L) BKA in 2016, HTN/ HLD, GERD, OSA, and gastroparesis. HIPAA/ identity verified with patient in person during home visit today.  Patient is alone for home visit; caregiver/ cousin Sedalia Muta, on [facility] CM written consent, arrives at very end of visit; pleasant 40 minute visit.  Today, patient reports he is "doing pretty good," but he states that he has "developed a tooth abscess."  States that on Saturday evening 08/24/17, he noticed soreness, and when he awoke on Sunday his (R) jaw was swollen/ tender and painful.  States that he contacted his dentist, but they will not schedule an appointment for him, as he "is in collections for owing them" for previous dental work.  Provided patient with [employer] contact information, as well as resources from [facility] specifically for [facility]; together, patient and I contacted his PCP to make PCP aware of possible tooth abscess; request sooner appointment than is scheduled for evaluation of need for antibiotics/ treatment options.  Patient declines need for additional assistance, and states that he will await follow up phone call from PCP staff, whom he has a "great relationship with."  Encouraged patient to promptly follow up on his acute tooth issue, and he verbalizes understanding and agreement, stating, "it's actually better today than it was on Sunday and Monday."  Patient is in no obvious/ apparent distress throughout entirety of today's home visit.  Subjective: "I know what I am supposed to be doing, but  I just haven't been doing it; I would like to have the health coach stay in touch with me so I can maybe try to get back on and stay on track."  Assessment:Adonus appears to have recuperated well after his October 2018 hospital visit for NSTEMI, and he remains committed to attending all provider appointments and adhering to his medication regimen. Taber has meals on wheels delivered to his home 2-3 times/ week and he denies further community resource needs, stating supportive family members that provide assistance with care needs as indicated. Hillel continues driving himself to all provider appointments. Lavere would like to have better control over his diabetes and to eventually decrease his A1-C, however, he has still not consistently incorporated blood sugar monitoring/ recording into his daily routine.  Braelynn would like to have ongoing support in his self-health management of DM, and agrees with referral placed today for [facility] RN Health Coach.  Patient further reports:  -- ongoing frequent issues with chronic gastroparesis; states 'today is better," but adds that he "has been sick on and off for weeks" around his (baseline) issues with ongoing nausea/ occasional vomiting/ decreased appetite.  Reports no additional ED visits after last one 08/02/17  -- leg wounds "completely healed now."  States that home health nursing services for wound care "stopped about 2 weeks ago," and that "leg has been fine."   -- has not been routinely or regularly monitoring or recording blood sugars due to ongoing "feeling bad with gastroparesis and not this tooth;" there were no recorded blood sugar values present in patient's [facility] CM booklet since last [facility] Community RN CM  telephone outreach.  I discussed with patient his lack of progress in making blood sugar monitoring a part of his daily routine, and he acknowledges that he "should be doing better."  Support and encouragement provided; today, we discussed/ reviewed/ summarized  previously provided printed EMMI educational material around self-health management of chronic disease state of DM; patient again verbalizes a good general understanding of self-health management strategies, but states that he "just can't do it sometimes,' when he is 'feeling bad from the gastroparesis." -- reports next PCP office visit 09/12/17; states that he will be having lab work (to include A1-C) on 09/05/17; reports good general understanding of importance/ significance of A1-C monitoring -- denies questions/ concerns/ recent changes around medications; reports has all medications and takes as prescribed; continues receiving samples as needed from PCP, as he reports he 'has for years."   Patient denies further issues, concerns, or problems today. We discussed that patient may benefit from ongoing [facility] Health Coach to be able to meet his previously established [facility] CM goals; patient is agreeable to my placing [facility] RN Health Coach referral, and I reiterated with him patient responsibilities; made patient aware that [facility] RN Health Coach would want to monitor/ record blood sugars regularly as a start; patient agreed to resume this practice today.  Objective:    BP 124/70   Pulse 90   Resp 18   SpO2 97%   Review of Systems  Constitutional: Negative.  Negative for chills and fever.  HENT:       Patient reports that he believes he has developed (R) tooth abscess; (R) jaw swollen/ tender  Respiratory: Negative.  Negative for cough, shortness of breath and wheezing.   Cardiovascular: Negative.  Negative for chest pain and leg swelling.  Gastrointestinal: Positive for nausea.       Reports ongoing chronic nausea/ occasional vomiting due to chronic state of gastroparesis; denies both N/V today  Genitourinary: Negative.   Musculoskeletal: Negative.  Negative for falls.  Neurological: Negative.  Negative for dizziness.  Psychiatric/Behavioral: Negative.  Negative for depression. The patient is not  nervous/anxious.    Physical Exam  Constitutional: He is oriented to person, place, and time. He appears well-developed and well-nourished. No distress.  Cardiovascular: Normal rate, regular rhythm, normal heart sounds and intact distal pulses.  Pulses:      Radial pulses are 2+ on the right side, and 2+ on the left side.  Respiratory: Effort normal and breath sounds normal. No respiratory distress. He has no wheezes. He has no rales.  GI: Soft. Bowel sounds are normal.  Musculoskeletal: He exhibits no edema.  (L) BKA  Neurological: He is alert and oriented to person, place, and time.  Skin: Skin is warm and dry. There is erythema.  (R) LE erythematous but dry; patient reports as baseline, states has not been draining "for over 2 weeks since home health nurse stopped coming."  Psychiatric: He has a normal mood and affect. His behavior is normal. Judgment and thought content normal.   Encounter Medications:   Outpatient Encounter Medications as of 08/27/2017  Medication Sig Note  . acarbose (PRECOSE) 50 MG tablet Take 50 mg 3 (three) times daily with meals by mouth.   [name]. amLODipine (NORVASC) 10 MG tablet Take 1 tablet (10 mg total) by mouth daily.   [name]. apixaban (ELIQUIS) 5 MG TABS tablet Take 1 tablet (5 mg total) by mouth 2 (two) times daily.   [name]. aspirin 81 MG EC tablet Take 1 tablet (81  mg total) by mouth daily.   [name] atorvastatin (LIPITOR) 80 MG tablet Take 1 tablet (80 mg total) by mouth daily at 6 PM.   . Canagliflozin (INVOKANA) 100 MG TABS Take 100 mg by mouth See admin instructions. invokana 100 mg daily. Alternating every other week between Jardiance and Invokana 04/26/2017: Invokana 300mg  in the home. Patient alternates between [place] and Jardiance depending on which samples he has.  . clopidogrel (PLAVIX) 75 MG tablet Take 1 tablet (75 mg total) by mouth daily.   . diphenhydramine-acetaminophen (TYLENOL PM) 25-500 MG TABS tablet Take 1 tablet by mouth at bedtime.   . docusate sodium  (COLACE) 100 MG capsule Take 100 mg by mouth daily as needed (for constipation).    [name] empagliflozin (JARDIANCE) 25 MG TABS tablet Take 10 mg See admin instructions by mouth. Alternating every other week between Jardiance and Invokana 08/02/2017: Pt stated he's been on the Jardiance for a couple days.  . ferrous sulfate 325 (65 FE) MG tablet Take 325 mg by mouth every evening.    . furosemide (LASIX) 20 MG tablet Take 1 tablet (20 mg total) by mouth daily. (Patient taking differently: Take 20 mg by mouth 2 (two) times daily. )   . gabapentin (NEURONTIN) 300 MG capsule Take 300 mg by mouth 3 (three) times daily. 10/06/2014: .  . ibuprofen (ADVIL,MOTRIN) 200 MG tablet Take 400 mg every 6 (six) hours as needed by mouth for headache or pain.   [name] insulin detemir (LEVEMIR) 100 UNIT/ML injection Inject 85 Units into the skin 2 (two) times daily.    . insulin lispro (HUMALOG) 100 UNIT/ML injection Inject 0-10 Units into the skin 3 (three) times daily with meals. Inject SQ per sliding scale 150-250 (5 units), 251-350(8 units), 351-450(10 units), >450=10 units   . lansoprazole (PREVACID) 30 MG capsule Take 30 mg by mouth 2 (two) times daily.   . metoCLOPramide (REGLAN) 5 MG tablet Take 10 mg by mouth at bedtime.    . metoprolol tartrate (LOPRESSOR) 25 MG tablet Take 1 tablet (25 mg total) by mouth 2 (two) times daily.   . nitroGLYCERIN (NITROSTAT) 0.4 MG SL tablet Place 1 tablet (0.4 mg total) under the tongue every 5 (five) minutes as needed for chest pain. 06/26/2017: Has not needed recently  . pioglitazone (ACTOS) 30 MG tablet Take 30 mg by mouth at bedtime.    . polyethylene glycol (MIRALAX / GLYCOLAX) packet Take 17 g by mouth daily as needed for moderate constipation.   . prochlorperazine (COMPAZINE) 10 MG tablet Take 10 mg by mouth daily as needed for nausea/vomiting.   . promethazine (PHENERGAN) 25 MG tablet Take 25 mg by mouth every 8 (eight) hours as needed for nausea or vomiting. Take one tablet by mouth  every 8 hours as needed nausea/vomiting   . ranitidine (ZANTAC) 150 MG tablet Take 150 mg by mouth at bedtime.    . ranolazine (RANEXA) 500 MG 12 hr tablet Take 1 tablet (500 mg total) by mouth 2 (two) times daily.   . sitaGLIPtin (JANUVIA) 100 MG tablet Take 100 mg daily by mouth.   . traMADol (ULTRAM) 50 MG tablet Take 100 mg by mouth every 12 (twelve) hours as needed (pain).    No facility-administered encounter medications on file as of 08/27/2017.    Functional Status:   In your present state of health, do you have any difficulty performing the following activities: 06/14/2017 04/19/2017  Hearing? N N  Vision? Y N  Comment Reports cataracts -  Difficulty concentrating or making decisions? N N  Walking or climbing stairs? Malvin Johns  Comment reports (L) BKA in 2016 -  Dressing or bathing? N N  Doing errands, shopping? Y N  Comment Reports family assists -  Quarry manager and eating ? N N  Using the Toilet? N N  In the past six months, have you accidently leaked urine? N N  Do you have problems with loss of bowel control? N N  Managing your Medications? Y N  Comment Reports family assists with meds -  Managing your Finances? N Y  Housekeeping or managing your Housekeeping? Malvin Johns  Comment Reports family assists -  Some recent data might be hidden   Fall/Depression Screening:    Fall Risk  08/27/2017 08/08/2017 07/31/2017  Falls in the past year? (No Data) (No Data) (No Data)  Comment Patient denies new/ recent falls Patient denies new/ recent falls per caregiver/ [name], on [facility] CM written consent, patient has not had recent/ new falls  Number falls in past yr: - - -  Injury with Fall? - - -  Risk Factor Category  - - -  Risk for fall due to : - - -  Follow up - - -   PHQ 2/9 Scores 06/14/2017 04/19/2017 04/12/2017 05/03/2014 03/12/2013  PHQ - 2 Score 0 0 0 0 0   Plan:  Patient willre-start monitoring and recording blood sugars several times each week  Patient will actively engage  with [facility] RN Health Coach for self-health management of chronic disease state of Diabetes-- [facility] RN Health Coach referral placed today; I will make patient's PCP aware that this referral has been placed (will send notes from home visit today in secure message via EMR)  Patient will promptly notify care providers for any new concerns, issues, or problems that arise  [facility] CM Care Plan Problem One     Most Recent Value  Care Plan Problem One  Need for self-health management of chronic disease state of DM, as evidenced by patient reporting  Role Documenting the Problem One  Care Management Coordinator  Care Plan for Problem One  Active  [facility] CM Short Term Goal #1   Over the next 30 days, patient will monitor and record blood sugars at least 3 times per week, as evidenced by patient reporting and review of blood sugar values during [facility] RN Health Coach outreach [Goal modified and re-established today]  [facility] CM Short Term Goal #1 Start Date  08/27/17 [Goal re-established/ modified today ]  Interventions for Short Term Goal #1  Using teachback method, discussed with patient his lack of progress/ non-adherence around blood sugar monitoring,  confirmed that patient is willing to continue/ re-start monitoring and recording blood sugars regularly, and discussed with him role of [facility] RN Health Coach, as well as his responsibilities around personal self- health management goals,   [facility] CCM RN reviewed with patient previously provided EMMI educational material, placed [facility] RN Health Coach referral, and again encouraged patient to incorporate blood sugar monitoring into his daily routine     Caryl Pina, RN, BSN, [employer] Coordinator [facility] Care Management  [phone]

## 2017-08-30 ENCOUNTER — Encounter: Payer: Self-pay | Admitting: *Deleted

## 2017-09-05 ENCOUNTER — Other Ambulatory Visit: Payer: Self-pay | Admitting: *Deleted

## 2017-09-05 DIAGNOSIS — E1165 Type 2 diabetes mellitus with hyperglycemia: Secondary | ICD-10-CM | POA: Diagnosis not present

## 2017-09-05 NOTE — Patient Outreach (Signed)
[facility]) Care Management  09/05/2017  Samuel BusingJohn D [name] [birth year] [id number]   RN Health Coach Initial Assessment  Referral Date:  08/27/2017 Referral Source:  [facility] Reason for Referral:  Continued Disease Management Education Insurance:  [employer]   Outreach Attempt:  Outreach attempt #1 to patient for introductory call. No answer. RN Health Coach left HIPAA compliant voicemail message along with contact information.  Plan:  RN Health Coach will make another outreach attempt to patient within one business day if no return call back from patient.   Samuel LernerFarrah Kinesha Auten RN [facility] Care Management  RN Health Coach [phone] [name]

## 2017-09-06 ENCOUNTER — Other Ambulatory Visit: Payer: Self-pay | Admitting: *Deleted

## 2017-09-06 NOTE — Patient Outreach (Signed)
[facility]) Care Management  09/06/2017  Samuel BusingJohn D [name] 27-Jul-1950 [id number]   RN Health Coach Initial Assessment  Referral Date:  08/27/2017 Ref[facility] Source:  [facility] Reason for Referral:  Continued Disease Management Education Insurance:  [employer]   Outreach Attempt:  Outreach attempt #2 to patient for introductory phone call. No answer. RN Health Coach left HIPAA compliant voicemail message along with contact information.   Plan:  RN Health Coach will send unsuccessful outreach letter to patient.  RN Health Coach will make final telephone outreach attempt to patient within the next 10 business days.  Samuel LernerFarrah Marissah Vandemark RN [facility] Care Management  RN Health Coach [phone] [name]

## 2017-09-06 NOTE — Patient Outreach (Addendum)
[facility]) Care Management  09/06/2017  Samuel BusingJohn D [name] 1950-09-23 [id number]   RN Health Coach Initial Assessment  Referral Date:  08/27/2017 Referral Source:  [facility] Community [facility] Reason for Referral:  Continued Disease Management Education Insurance:  [employer]   Outreach Attempt:  Successful telephone outreach to patient for introduction call.  HIPAA verified with patient.  RN Health Coach introduced self and role.  Patient verbally agrees to monthly telephone outreaches.  States his tooth abscess is doing better.  Verbalizes his primary care provider prescribed him antibiotics which he has been taking.  Patient states he has not yet seen a Dentist and has not called the numbers provided to him by [facility].  Patient encouraged to contact the dental offices provided to him and to see a Dentist as soon as possible.  Plan:  RN Health Coach will make outreach to complete initial telephone assessment within the next 14 business days.  Samuel LernerFarrah Bruchy Mikel RN [facility] Care Management  RN Health Coach [phone] [name]

## 2017-09-10 DIAGNOSIS — I259 Chronic ischemic heart disease, unspecified: Secondary | ICD-10-CM | POA: Diagnosis not present

## 2017-09-10 DIAGNOSIS — Z89512 Acquired absence of left leg below knee: Secondary | ICD-10-CM | POA: Diagnosis not present

## 2017-09-10 DIAGNOSIS — G4733 Obstructive sleep apnea (adult) (pediatric): Secondary | ICD-10-CM | POA: Diagnosis not present

## 2017-09-10 DIAGNOSIS — Z4789 Encounter for other orthopedic aftercare: Secondary | ICD-10-CM | POA: Diagnosis not present

## 2017-09-10 DIAGNOSIS — E139 Other specified diabetes mellitus without complications: Secondary | ICD-10-CM | POA: Diagnosis not present

## 2017-09-10 DIAGNOSIS — L03119 Cellulitis of unspecified part of limb: Secondary | ICD-10-CM | POA: Diagnosis not present

## 2017-09-10 DIAGNOSIS — M86469 Chronic osteomyelitis with draining sinus, unspecified tibia and fibula: Secondary | ICD-10-CM | POA: Diagnosis not present

## 2017-09-12 DIAGNOSIS — L89301 Pressure ulcer of unspecified buttock, stage 1: Secondary | ICD-10-CM | POA: Diagnosis not present

## 2017-09-12 DIAGNOSIS — E118 Type 2 diabetes mellitus with unspecified complications: Secondary | ICD-10-CM | POA: Diagnosis not present

## 2017-09-20 ENCOUNTER — Other Ambulatory Visit: Payer: Self-pay | Admitting: *Deleted

## 2017-09-20 NOTE — Patient Outreach (Signed)
[facility]) Care [name]  09/20/2017  Samuel BusingJohn D [name] 1951/02/27 [id number]   Samuel [name] Health Coach Initial Assessment  Referral Date:  08/27/2017 Referral Source:  [facility] Reason for Referral:  Continued Disease [name] Education Insurance:  [employer]   Outreach Attempt:  Outreach attempt #1 to patient for initial telephone assessment. No answer. Samuel [name] Health Coach left HIPAA compliant voicemail message along with contact information.  Plan:  Samuel [name] Health Coach will send unsuccessful outreach letter to patient.  Samuel [name] Health Coach will make another outreach attempt to patient within 3-4 business days if no return call back from patient.   Samuel LernerFarrah Laken [name] Samuel [name] [facility] Care Management  Samuel [name] Health Coach [phone] Samuel [name]

## 2017-09-21 ENCOUNTER — Encounter (HOSPITAL_COMMUNITY): Payer: Self-pay | Admitting: Nurse Practitioner

## 2017-09-21 ENCOUNTER — Inpatient Hospital Stay (HOSPITAL_COMMUNITY)
Admission: EM | Admit: 2017-09-21 | Discharge: 2017-09-24 | DRG: 638 | Disposition: A | Discharge status: Home / Self Care | Payer: PPO | Attending: Internal Medicine | Admitting: Internal Medicine

## 2017-09-21 DIAGNOSIS — Z955 Presence of coronary angioplasty implant and graft: Secondary | ICD-10-CM | POA: Diagnosis not present

## 2017-09-21 DIAGNOSIS — Z9114 Patient's other noncompliance with medication regimen: Secondary | ICD-10-CM

## 2017-09-21 DIAGNOSIS — IMO0002 Reserved for concepts with insufficient information to code with codable children: Secondary | ICD-10-CM | POA: Diagnosis present

## 2017-09-21 DIAGNOSIS — E669 Obesity, unspecified: Secondary | ICD-10-CM | POA: Diagnosis not present

## 2017-09-21 DIAGNOSIS — Z79899 Other long term (current) drug therapy: Secondary | ICD-10-CM | POA: Diagnosis not present

## 2017-09-21 DIAGNOSIS — I10 Essential (primary) hypertension: Secondary | ICD-10-CM | POA: Diagnosis not present

## 2017-09-21 DIAGNOSIS — Z9111 Patient's noncompliance with dietary regimen: Secondary | ICD-10-CM | POA: Diagnosis not present

## 2017-09-21 DIAGNOSIS — I252 Old myocardial infarction: Secondary | ICD-10-CM

## 2017-09-21 DIAGNOSIS — K219 Gastro-esophageal reflux disease without esophagitis: Secondary | ICD-10-CM | POA: Diagnosis not present

## 2017-09-21 DIAGNOSIS — E1169 Type 2 diabetes mellitus with other specified complication: Secondary | ICD-10-CM | POA: Diagnosis not present

## 2017-09-21 DIAGNOSIS — Z7901 Long term (current) use of anticoagulants: Secondary | ICD-10-CM | POA: Diagnosis not present

## 2017-09-21 DIAGNOSIS — E11649 Type 2 diabetes mellitus with hypoglycemia without coma: Secondary | ICD-10-CM | POA: Diagnosis not present

## 2017-09-21 DIAGNOSIS — E1151 Type 2 diabetes mellitus with diabetic peripheral angiopathy without gangrene: Secondary | ICD-10-CM | POA: Diagnosis present

## 2017-09-21 DIAGNOSIS — E1165 Type 2 diabetes mellitus with hyperglycemia: Secondary | ICD-10-CM | POA: Diagnosis present

## 2017-09-21 DIAGNOSIS — E1142 Type 2 diabetes mellitus with diabetic polyneuropathy: Secondary | ICD-10-CM | POA: Diagnosis present

## 2017-09-21 DIAGNOSIS — G4733 Obstructive sleep apnea (adult) (pediatric): Secondary | ICD-10-CM | POA: Diagnosis not present

## 2017-09-21 DIAGNOSIS — E876 Hypokalemia: Secondary | ICD-10-CM | POA: Diagnosis not present

## 2017-09-21 DIAGNOSIS — Z7982 Long term (current) use of aspirin: Secondary | ICD-10-CM | POA: Diagnosis not present

## 2017-09-21 DIAGNOSIS — R112 Nausea with vomiting, unspecified: Secondary | ICD-10-CM | POA: Diagnosis present

## 2017-09-21 DIAGNOSIS — J449 Chronic obstructive pulmonary disease, unspecified: Secondary | ICD-10-CM | POA: Diagnosis not present

## 2017-09-21 DIAGNOSIS — R1111 Vomiting without nausea: Secondary | ICD-10-CM | POA: Diagnosis not present

## 2017-09-21 DIAGNOSIS — I248 Other forms of acute ischemic heart disease: Secondary | ICD-10-CM | POA: Diagnosis not present

## 2017-09-21 DIAGNOSIS — K3184 Gastroparesis: Secondary | ICD-10-CM

## 2017-09-21 DIAGNOSIS — Z9861 Coronary angioplasty status: Secondary | ICD-10-CM | POA: Diagnosis not present

## 2017-09-21 DIAGNOSIS — E785 Hyperlipidemia, unspecified: Secondary | ICD-10-CM | POA: Diagnosis present

## 2017-09-21 DIAGNOSIS — E1110 Type 2 diabetes mellitus with ketoacidosis without coma: Principal | ICD-10-CM

## 2017-09-21 DIAGNOSIS — Z794 Long term (current) use of insulin: Secondary | ICD-10-CM | POA: Diagnosis not present

## 2017-09-21 DIAGNOSIS — J9811 Atelectasis: Secondary | ICD-10-CM | POA: Diagnosis not present

## 2017-09-21 DIAGNOSIS — Z89519 Acquired absence of unspecified leg below knee: Secondary | ICD-10-CM

## 2017-09-21 DIAGNOSIS — Z89512 Acquired absence of left leg below knee: Secondary | ICD-10-CM

## 2017-09-21 DIAGNOSIS — E119 Type 2 diabetes mellitus without complications: Secondary | ICD-10-CM | POA: Diagnosis not present

## 2017-09-21 DIAGNOSIS — Z86711 Personal history of pulmonary embolism: Secondary | ICD-10-CM

## 2017-09-21 DIAGNOSIS — Z6833 Body mass index (BMI) 33.0-33.9, adult: Secondary | ICD-10-CM | POA: Diagnosis not present

## 2017-09-21 DIAGNOSIS — E1143 Type 2 diabetes mellitus with diabetic autonomic (poly)neuropathy: Secondary | ICD-10-CM | POA: Diagnosis not present

## 2017-09-21 DIAGNOSIS — I2510 Atherosclerotic heart disease of native coronary artery without angina pectoris: Secondary | ICD-10-CM

## 2017-09-21 DIAGNOSIS — Z7902 Long term (current) use of antithrombotics/antiplatelets: Secondary | ICD-10-CM

## 2017-09-21 DIAGNOSIS — R001 Bradycardia, unspecified: Secondary | ICD-10-CM | POA: Diagnosis not present

## 2017-09-21 DIAGNOSIS — I499 Cardiac arrhythmia, unspecified: Secondary | ICD-10-CM | POA: Diagnosis not present

## 2017-09-21 DIAGNOSIS — Z66 Do not resuscitate: Secondary | ICD-10-CM | POA: Diagnosis present

## 2017-09-21 DIAGNOSIS — K56609 Unspecified intestinal obstruction, unspecified as to partial versus complete obstruction: Secondary | ICD-10-CM | POA: Diagnosis not present

## 2017-09-21 LAB — COMPREHENSIVE METABOLIC PANEL
ALT: 23 U/L (ref 17–63)
ANION GAP: 20 — AB (ref 5–15)
AST: 25 U/L (ref 15–41)
Albumin: 3.7 g/dL (ref 3.5–5.0)
Alkaline Phosphatase: 99 U/L (ref 38–126)
BILIRUBIN TOTAL: 1.6 mg/dL — AB (ref 0.3–1.2)
BUN: 15 mg/dL (ref 6–20)
CO2: 19 mmol/L — ABNORMAL LOW (ref 22–32)
Calcium: 9.3 mg/dL (ref 8.9–10.3)
Chloride: 99 mmol/L — ABNORMAL LOW (ref 101–111)
Creatinine, Ser: 1.14 mg/dL (ref 0.61–1.24)
Glucose, Bld: 328 mg/dL — ABNORMAL HIGH (ref 65–99)
POTASSIUM: 4.2 mmol/L (ref 3.5–5.1)
Sodium: 138 mmol/L (ref 135–145)
TOTAL PROTEIN: 7.7 g/dL (ref 6.5–8.1)

## 2017-09-21 LAB — URINALYSIS, ROUTINE W REFLEX MICROSCOPIC
BACTERIA UA: NONE SEEN
Bilirubin Urine: NEGATIVE
Glucose, UA: >=500 mg/dL — AB
Ketones, ur: 80 mg/dL — AB
Leukocytes, UA: NEGATIVE
Nitrite: NEGATIVE
PH: 6 (ref 5.0–8.0)
Protein, ur: NEGATIVE mg/dL
Specific Gravity, Urine: 1.027 (ref 1.005–1.030)
Squamous Epithelial / LPF: NONE SEEN

## 2017-09-21 LAB — CBC
HEMATOCRIT: 44.8 % (ref 39.0–52.0)
HEMOGLOBIN: 15.7 g/dL (ref 13.0–17.0)
MCH: 31.4 pg (ref 26.0–34.0)
MCHC: 35 g/dL (ref 30.0–36.0)
MCV: 89.6 fL (ref 78.0–100.0)
Platelets: 211 10*3/uL (ref 150–400)
RBC: 5 MIL/uL (ref 4.22–5.81)
RDW: 12.7 % (ref 11.5–15.5)
WBC: 8.9 10*3/uL (ref 4.0–10.5)

## 2017-09-21 LAB — CBG MONITORING, ED: GLUCOSE-CAPILLARY: 333 mg/dL — AB (ref 65–99)

## 2017-09-21 MED ORDER — DICYCLOMINE HCL 10 MG/ML IM SOLN
20.0000 mg | Freq: Once | INTRAMUSCULAR | Status: AC
  Administered 2017-09-21: 20 mg via INTRAMUSCULAR
  Filled 2017-09-21: qty 2

## 2017-09-21 MED ORDER — FAMOTIDINE IN NACL 20-0.9 MG/50ML-% IV SOLN
20.0000 mg | Freq: Once | INTRAVENOUS | Status: AC
  Administered 2017-09-21: 20 mg via INTRAVENOUS
  Filled 2017-09-21: qty 50

## 2017-09-21 MED ORDER — SODIUM CHLORIDE 0.9 % IV BOLUS
1000.0000 mL | Freq: Once | INTRAVENOUS | Status: AC
  Administered 2017-09-21: 1000 mL via INTRAVENOUS

## 2017-09-21 MED ORDER — PROMETHAZINE HCL 25 MG/ML IJ SOLN
12.5000 mg | INTRAMUSCULAR | Status: AC
  Administered 2017-09-21: 12.5 mg via INTRAVENOUS
  Filled 2017-09-21: qty 1

## 2017-09-21 NOTE — ED Triage Notes (Signed)
Pt states he is having a gastroparesis flare up, c/o severe nausea and vomiting. States he was unable to take his insulin today as he had a funeral to attend. Denies abdominal pain, states the 4mg  IV Zofran from EMS didn't help, state only promethazine works for him.

## 2017-09-21 NOTE — ED Notes (Signed)
Bed: [id number] Expected date:  Expected time:  Means of arrival:  Comments: 67 yo M/ Vomiting

## 2017-09-21 NOTE — ED Provider Notes (Signed)
[facility]-EMERGENCY DEPT Provider Note   CSN: [id number] Arrival date & time: 09/21/17  2133     History   Chief Complaint Chief Complaint  Patient presents with  . Gastroparesis N/V    HPI Samuel [name] is a 67 y.o. male.  67 year old male with a history of hypertension, CAD, esophageal reflux, diabetes mellitus, gastroparesis presents to the emergency department for gastroparesis flare.  He reports onset of vomiting at 1800 tonight.  He tried taking his oral Phenergan, but could not swallow it.  He has had approximately 6 episodes of nonbloody emesis.  He had a bowel movement earlier today which was looser and also nonbloody.  He denies any associated abdominal pain or recent fevers.  His sugars have been running slightly high today secondary to medication noncompliance.  He states that it is difficult to remember to take his medicines.  He is requesting Phenergan as this is the only thing that helps his gastroparesis flare.     Past Medical History:  Diagnosis Date  . Anxiety   . Arthritis    "all over"   . CAD (coronary artery disease)   . Charcot's joint    "left foot"  . 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 Depression   . Gastroparesis   . GERD (gastroesophageal reflux disease)   . H/O hiatal hernia   . Hyperlipidemia   . Hypertension   . Myocardial infarction (HCC) 2017/03/27   around this date  . OSA (obstructive sleep apnea)    "not bad enough for a mask"  . Peripheral neuropathy   . Peripheral vascular disease (HCC)   . PONV (postoperative nausea and vomiting)   . Pulmonary embolism (HCC)    hx. of 2012  . Shortness of breath    exertion  . Type II diabetes mellitus [facility](HCC)     Patient Active Problem List   Diagnosis Date Noted  . DKA, type 2, not at goal [facility](HCC) 09/22/2017  . S/P BKA (below knee amputation) (HCC) 04/19/2017  . COPD (chronic obstructive pulmonary disease) (HCC) 04/19/2017  . NSTEMI (non-ST  elevated myocardial infarction) (HCC) 04/07/2017  . Osteomyelitis of foot (HCC)   . Protein-calorie malnutrition, severe (HCC) 03/08/2015  . Osteomyelitis of foot, acute (HCC) 03/07/2015  . Sepsis (HCC) 03/07/2015  . Diabetes mellitus type 2, uncontrolled (HCC) 03/07/2015  . Normocytic normochromic anemia 03/07/2015  . Obesity (BMI 30-39.9) 03/07/2015  . Nausea & vomiting   . DM (diabetes mellitus), type 2 (HCC) 01/07/2014  . Chronic osteomyelitis involving lower leg (HCC) 01/07/2014  . Vomiting 12/22/2013  . Gastroparesis 12/21/2013  . Intractable nausea and vomiting 12/21/2013  . Essential hypertension 05/01/2013  . Hyperlipidemia due to type 2 diabetes mellitus (HCC) 05/01/2013  . Generalized weakness 03/20/2013  . Back pain 03/20/2013  . Pulmonary embolism (HCC) 03/27/2011  . PERFORATION OF GALLBLADDER 08/30/2010  . Ulcer of lower limb, unspecified 03/01/2010  . METHICILLIN SUSCEPTIBLE STAPH AUREUS SEPTICEMIA 01/25/2010  . Acute osteomyelitis, ankle and foot 01/25/2010  . NAUSEA AND VOMITING 01/25/2010  . GERD 11/09/2009  . CAD S/P DES PCI to mLAD: Xience DES 2.75 x 15 (3.0 mm) 05/01/2009  . DIABETES, TYPE 1 05/14/2008  . OBSTRUCTIVE SLEEP APNEA 04/30/2008  . ALLERGIC RHINITIS, SEASONAL 04/30/2008    Past Surgical History:  Procedure Laterality Date  . AMPUTATION Left 03/08/2015   Procedure:  LEFT AMPUTATION BELOW KNEE;  Surgeon: Toni ArthursJohn Hewitt, MD;  Location: [facility] ORS;  Service: Orthopedics;  Laterality: Left;  . APPLICATION OF WOUND VAC Left 01/07/2014  . CHOLECYSTECTOMY  06/2010  . CORONARY ANGIOPLASTY WITH STENT PLACEMENT  05/2009   "1"  . FOOT SURGERY Left 2010   "for Charcot's joint"  . HARDWARE REMOVAL Left 01/07/2014   Procedure: LEFT LEG REMOVAL OF DEEP IMPLANT AND SEQUESTRECTOMY; APPLICATION OF WOUND VAC ;  Surgeon: Toni Arthurs, MD;  Location: [facility] OR;  Service: Orthopedics;  Laterality: Left;  . I&D EXTREMITY Left "multiple"   leg  . I&D EXTREMITY Left 01/07/2014    Procedure: 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;  Surgeon: Toni Arthurs, MD;  Location: [facility] OR;  Service: Orthopedics;  Laterality: Left;  . IM NAILING TIBIA Left ~ 2012  . LEFT HEART CATH AND CORONARY ANGIOGRAPHY N/A 04/08/2017   Procedure: LEFT HEART CATH AND CORONARY ANGIOGRAPHY;  Surgeon: Runell Gess, MD;  Location: [facility] INVASIVE CV LAB;  Service: Cardiovascular;  Laterality: N/A;  . SHOULDER ARTHROSCOPY W/ ROTATOR CUFF REPAIR Left    "and bone spurs"  . TIBIAL IM ROD REMOVAL Left 01/07/2014  . TOE AMPUTATION Right ~ 2011   "great toe"  . VENA CAVA FILTER PLACEMENT  2012  . VENA CAVA FILTER PLACEMENT N/A 11/20/2016   Procedure: INSERTION VENA-CAVA FILTER;  Surgeon: Sherren Kerns, MD;  Location: [facility] OR;  Service: Vascular;  Laterality: N/A;  . WOUND DEBRIDEMENT Left 01/07/2014   "tibia"        Home Medications    Prior to Admission medications   Medication Sig Start Date End Date Taking? Authorizing Provider  acarbose (PRECOSE) 50 MG tablet Take 50 mg 3 (three) times daily with meals by mouth.   Yes [name], MD  amLODipine (NORVASC) 10 MG tablet Take 1 tablet (10 mg total) by mouth daily. 11/22/16  Yes Richarda Overlie, MD  apixaban (ELIQUIS) 5 MG TABS tablet Take 1 tablet (5 mg total) by mouth 2 (two) times daily. 04/11/17 04/11/18 Yes Glade Lloyd, MD  aspirin 81 MG EC tablet Take 1 tablet (81 mg total) by mouth daily. 04/12/17  Yes Glade Lloyd, MD  atorvastatin (LIPITOR) 80 MG tablet Take 1 tablet (80 mg total) by mouth daily at 6 PM. 04/11/17  Yes Alekh, Kshitiz, MD  Canagliflozin (INVOKANA) 100 MG TABS Take 100 mg by mouth See admin instructions. invokana 100 mg daily. Alternating every other week between Jardiance and Invokana   Yes [provider]  clopidogrel (PLAVIX) 75 MG tablet Take 1 tablet (75 mg total) by mouth daily. 04/12/17  Yes Glade Lloyd, MD  diphenhydramine-acetaminophen (TYLENOL PM) 25-500 MG TABS tablet Take 1 tablet by mouth at  bedtime.   Yes [provider]  docusate sodium (COLACE) 100 MG capsule Take 100 mg by mouth daily as needed (for constipation).    Yes [provider]  empagliflozin (JARDIANCE) 25 MG TABS tablet Take 10 mg See admin instructions by mouth. Alternating every other week between Jardiance and Invokana   Yes [provider]  ferrous sulfate 325 (65 FE) MG tablet Take 325 mg by mouth every evening.    Yes [provider]  furosemide (LASIX) 20 MG tablet Take 1 tablet (20 mg total) by mouth daily. Patient taking differently: Take 20 mg by mouth 2 (two) times daily.  04/12/17  Yes Glade Lloyd, MD  gabapentin (NEURONTIN) 300 MG capsule Take 300 mg by mouth 3 (three) times daily.   Yes [provider]  ibuprofen (ADVIL,MOTRIN) 200 MG tablet Take 400 mg every 6 (six)  hours as needed by mouth for headache or pain.   Yes [provider]  insulin detemir (LEVEMIR) 100 UNIT/ML injection Inject 85 Units into the skin 2 (two) times daily.    Yes [provider]  insulin lispro (HUMALOG) 100 UNIT/ML injection Inject 0-10 Units into the skin 3 (three) times daily with meals. Inject SQ per sliding scale 150-250 (5 units), 251-350(8 units), 351-450(10 units), >450=10 units   Yes [provider]  lansoprazole (PREVACID) 30 MG capsule Take 30 mg by mouth 2 (two) times daily. 03/19/17  Yes [provider]  metoCLOPramide (REGLAN) 5 MG tablet Take 10 mg by mouth at bedtime.    Yes [provider]  metoprolol tartrate (LOPRESSOR) 25 MG tablet Take 1 tablet (25 mg total) by mouth 2 (two) times daily. 04/11/17  Yes Glade Lloyd, MD  nitroGLYCERIN (NITROSTAT) 0.4 MG SL tablet Place 1 tablet (0.4 mg total) under the tongue every 5 (five) minutes as needed for chest pain. 04/11/17  Yes Glade Lloyd, MD  pioglitazone (ACTOS) 30 MG tablet Take 30 mg by mouth at bedtime.    Yes [provider]  polyethylene glycol (MIRALAX /  GLYCOLAX) packet Take 17 g by mouth daily as needed for moderate constipation. 04/11/17  Yes Glade Lloyd, MD  prochlorperazine (COMPAZINE) 10 MG tablet Take 10 mg by mouth daily as needed for nausea/vomiting. 04/01/17  Yes [provider]  promethazine (PHENERGAN) 25 MG tablet Take 25 mg by mouth every 8 (eight) hours as needed for nausea or vomiting.    Yes [provider]  ranitidine (ZANTAC) 150 MG tablet Take 150 mg by mouth at bedtime.    Yes [provider]  ranolazine (RANEXA) 500 MG 12 hr tablet Take 1 tablet (500 mg total) by mouth 2 (two) times daily. 04/11/17  Yes Glade Lloyd, MD  sitaGLIPtin (JANUVIA) 100 MG tablet Take 100 mg daily by mouth.   Yes [name], MD  traMADol (ULTRAM) 50 MG tablet Take 100 mg by mouth every 12 (twelve) hours as needed (pain).   Yes [provider]    Family History Family History  Problem Relation Age of Onset  . COPD Mother   . Heart disease Mother   . Lung cancer Mother   . Retinal detachment Father   . Alcoholism Brother   . Heart disease Brother   . COPD Brother   . Diabetes Brother   . Hypertension Brother   . Stroke Brother     Social History Social History   Tobacco Use  . Smoking status: Never Smoker  . Smokeless tobacco: Never Used  Substance Use Topics  . Alcohol use: No    Frequency: Never    Comment: 01/07/2014 'might have a wine cooler a couple times/yr"  . Drug use: No     Allergies   Gabapentin; Nabumetone; and Codeine   Review of Systems Review of Systems Ten systems reviewed and are negative for acute change, except as noted in the HPI.    Physical Exam Updated Vital Signs BP 115/63 (BP Location: Left Arm)   Pulse 90   Temp 97.8 F (36.6 C) (Oral)   Resp (!) 24   SpO2 100%   Physical Exam  Constitutional: He is oriented to person, place, and time. He appears well-developed and well-nourished. No distress.  Nontoxic appearing and in NAD  HENT:  Head:  Normocephalic and atraumatic.  Mildly dry mm  Eyes: Conjunctivae and EOM are normal. No scleral icterus.  Neck:  Normal range of motion.  Cardiovascular: Normal rate, regular rhythm and intact distal pulses.  Pulmonary/Chest: Effort normal. No stridor. No respiratory distress. He has no wheezes.  Lungs CTAB  Abdominal: Soft. He exhibits no mass. There is no guarding.  Soft, obese, nontender  Musculoskeletal: Normal range of motion.  Neurological: He is alert and oriented to person, place, and time. He exhibits normal muscle tone. Coordination normal.  Skin: Skin is warm and dry. No rash noted. He is not diaphoretic. No erythema. No pallor.  Psychiatric: He has a normal mood and affect. His behavior is normal.  Nursing note and vitals reviewed.    ED Treatments / Results  Labs (all labs ordered are listed, but only abnormal results are displayed) Labs Reviewed  COMPREHENSIVE METABOLIC PANEL - Abnormal; Notable for the following components:      Result Value   Chloride 99 (*)    CO2 19 (*)    Glucose, Bld 328 (*)    Total Bilirubin 1.6 (*)    Anion gap 20 (*)    All other components within normal limits  URINALYSIS, ROUTINE W REFLEX MICROSCOPIC - Abnormal; Notable for the following components:   Color, Urine STRAW (*)    Glucose, UA >=500 (*)    Hgb urine dipstick SMALL (*)    Ketones, ur 80 (*)    All other components within normal limits  CBG MONITORING, ED - Abnormal; Notable for the following components:   Glucose-Capillary 333 (*)    All other components within normal limits  CBC  TROPONIN I  I-STAT CG4 LACTIC ACID, ED    EKG None  Radiology No results found.  Procedures Procedures (including critical care time)  Medications Ordered in ED Medications  dextrose 5 %-0.45 % sodium chloride infusion (has no administration in time range)  insulin regular (NOVOLIN R,HUMULIN R) 100 Units in sodium chloride 0.9 % 100 mL (1 Units/mL) infusion (has no administration in  time range)  potassium chloride 10 mEq in 100 mL IVPB (has no administration in time range)  sodium chloride 0.9 % bolus 1,000 mL (1,000 mLs Intravenous New Bag/Given 09/21/17 2314)  promethazine (PHENERGAN) injection 12.5 mg (12.5 mg Intravenous Given 09/21/17 2315)  famotidine (PEPCID) IVPB 20 mg premix (20 mg Intravenous New Bag/Given 09/21/17 2314)  dicyclomine (BENTYL) injection 20 mg (20 mg Intramuscular Given 09/21/17 2315)    CRITICAL CARE Performed by: Antony Madura   Total critical care time: 35 minutes  Critical care time was exclusive of separately billable procedures and treating other patients.  Critical care was necessary to treat or prevent imminent or life-threatening deterioration.  Critical care was time spent personally by me on the following activities: development of treatment plan with patient and/or surrogate as well as nursing, discussions with consultants, evaluation of patient's response to treatment, examination of patient, obtaining history from patient or surrogate, ordering and performing treatments and interventions, ordering and review of laboratory studies, ordering and review of radiographic studies, pulse oximetry and re-evaluation of patient's condition.   Initial Impression / Assessment and Plan / ED Course  I have reviewed the triage vital signs and the nursing notes.  Pertinent labs & imaging results that were available during my care of the patient were reviewed by me and considered in my medical decision making (see chart for details).     67 year old male presents to the emergency department for nausea and vomiting which is consistent with past episodes of gastroparesis.  Patient with some symptomatic improvement with IV  Phenergan, Pepcid, Bentyl.  He is hyperglycemic today with low bicarb and elevated anion gap.  In the setting of ketonuria, unable to exclude DKA.  The patient has been started on an insulin drip as well as D51/2NS for CBG <250.  Plan for  observation admission for additional management.  Case discussed with Dr. Adela Glimpse of [facility] who will admit; lactic added at the request of Dr. Adela Glimpse.   Final Clinical Impressions(s) / ED Diagnoses   Final diagnoses:  Diabetic ketoacidosis without coma associated with type 2 diabetes mellitus [facility])  Gastroparesis    ED Discharge Orders    None       Antony Madura, PA-C 09/22/17 0045    Cathren Laine, MD 09/22/17 [phone]

## 2017-09-22 ENCOUNTER — Observation Stay (HOSPITAL_COMMUNITY): Payer: PPO

## 2017-09-22 ENCOUNTER — Encounter (HOSPITAL_COMMUNITY): Payer: Self-pay | Admitting: Internal Medicine

## 2017-09-22 DIAGNOSIS — J449 Chronic obstructive pulmonary disease, unspecified: Secondary | ICD-10-CM

## 2017-09-22 DIAGNOSIS — K56609 Unspecified intestinal obstruction, unspecified as to partial versus complete obstruction: Secondary | ICD-10-CM | POA: Diagnosis not present

## 2017-09-22 DIAGNOSIS — I2510 Atherosclerotic heart disease of native coronary artery without angina pectoris: Secondary | ICD-10-CM | POA: Diagnosis not present

## 2017-09-22 DIAGNOSIS — E1169 Type 2 diabetes mellitus with other specified complication: Secondary | ICD-10-CM | POA: Diagnosis not present

## 2017-09-22 DIAGNOSIS — E785 Hyperlipidemia, unspecified: Secondary | ICD-10-CM | POA: Diagnosis not present

## 2017-09-22 DIAGNOSIS — J9811 Atelectasis: Secondary | ICD-10-CM | POA: Diagnosis not present

## 2017-09-22 DIAGNOSIS — I10 Essential (primary) hypertension: Secondary | ICD-10-CM | POA: Diagnosis not present

## 2017-09-22 DIAGNOSIS — Z89512 Acquired absence of left leg below knee: Secondary | ICD-10-CM | POA: Diagnosis not present

## 2017-09-22 DIAGNOSIS — E1110 Type 2 diabetes mellitus with ketoacidosis without coma: Principal | ICD-10-CM | POA: Diagnosis present

## 2017-09-22 DIAGNOSIS — K3184 Gastroparesis: Secondary | ICD-10-CM | POA: Diagnosis not present

## 2017-09-22 DIAGNOSIS — R112 Nausea with vomiting, unspecified: Secondary | ICD-10-CM

## 2017-09-22 DIAGNOSIS — Z9861 Coronary angioplasty status: Secondary | ICD-10-CM | POA: Diagnosis not present

## 2017-09-22 LAB — BASIC METABOLIC PANEL
Anion gap: 16 — ABNORMAL HIGH (ref 5–15)
Anion gap: 12 (ref 5–15)
Anion gap: 12 (ref 5–15)
BUN: 15 mg/dL (ref 6–20)
BUN: 16 mg/dL (ref 6–20)
BUN: 16 mg/dL (ref 6–20)
CALCIUM: 8.8 mg/dL — AB (ref 8.9–10.3)
CALCIUM: 8.8 mg/dL — AB (ref 8.9–10.3)
CALCIUM: 8.8 mg/dL — AB (ref 8.9–10.3)
CO2: 18 mmol/L — AB (ref 22–32)
CO2: 20 mmol/L — AB (ref 22–32)
CO2: 21 mmol/L — AB (ref 22–32)
CREATININE: 1.15 mg/dL (ref 0.61–1.24)
CREATININE: 1 mg/dL (ref 0.61–1.24)
Chloride: 105 mmol/L (ref 101–111)
Chloride: 108 mmol/L (ref 101–111)
Chloride: 109 mmol/L (ref 101–111)
Creatinine, Ser: 1.11 mg/dL (ref 0.61–1.24)
GFR calc Af Amer: >60 mL/min (ref >60)
GFR calc Af Amer: >60 mL/min (ref >60)
GFR calc Af Amer: >60 mL/min (ref >60)
GFR calc non Af Amer: >60 mL/min (ref >60)
GFR calc non Af Amer: >60 mL/min (ref >60)
GFR calc non Af Amer: >60 mL/min (ref >60)
GLUCOSE: 159 mg/dL — AB (ref 65–99)
GLUCOSE: 148 mg/dL — AB (ref 65–99)
Glucose, Bld: 179 mg/dL — ABNORMAL HIGH (ref 65–99)
Potassium: 3.6 mmol/L (ref 3.5–5.1)
Potassium: 3.7 mmol/L (ref 3.5–5.1)
Potassium: 3.5 mmol/L (ref 3.5–5.1)
Sodium: 139 mmol/L (ref 135–145)
Sodium: 140 mmol/L (ref 135–145)
Sodium: 142 mmol/L (ref 135–145)

## 2017-09-22 LAB — GLUCOSE, CAPILLARY
GLUCOSE-CAPILLARY: 209 mg/dL — AB (ref 65–99)
GLUCOSE-CAPILLARY: 220 mg/dL — AB (ref 65–99)
GLUCOSE-CAPILLARY: 179 mg/dL — AB (ref 65–99)
GLUCOSE-CAPILLARY: 165 mg/dL — AB (ref 65–99)
GLUCOSE-CAPILLARY: 140 mg/dL — AB (ref 65–99)
GLUCOSE-CAPILLARY: 141 mg/dL — AB (ref 65–99)
GLUCOSE-CAPILLARY: 145 mg/dL — AB (ref 65–99)
GLUCOSE-CAPILLARY: 128 mg/dL — AB (ref 65–99)
GLUCOSE-CAPILLARY: 189 mg/dL — AB (ref 65–99)
Glucose-Capillary: 277 mg/dL — ABNORMAL HIGH (ref 65–99)
Glucose-Capillary: 211 mg/dL — ABNORMAL HIGH (ref 65–99)
Glucose-Capillary: 171 mg/dL — ABNORMAL HIGH (ref 65–99)
Glucose-Capillary: 141 mg/dL — ABNORMAL HIGH (ref 65–99)
Glucose-Capillary: 120 mg/dL — ABNORMAL HIGH (ref 65–99)
Glucose-Capillary: 136 mg/dL — ABNORMAL HIGH (ref 65–99)
Glucose-Capillary: 138 mg/dL — ABNORMAL HIGH (ref 65–99)
Glucose-Capillary: 135 mg/dL — ABNORMAL HIGH (ref 65–99)

## 2017-09-22 LAB — COMPREHENSIVE METABOLIC PANEL
ALT: 20 U/L (ref 17–63)
ANION GAP: 18 — AB (ref 5–15)
AST: 18 U/L (ref 15–41)
Albumin: 3.1 g/dL — ABNORMAL LOW (ref 3.5–5.0)
Alkaline Phosphatase: 80 U/L (ref 38–126)
BILIRUBIN TOTAL: 1.4 mg/dL — AB (ref 0.3–1.2)
BUN: 15 mg/dL (ref 6–20)
CO2: 16 mmol/L — ABNORMAL LOW (ref 22–32)
Calcium: 8.5 mg/dL — ABNORMAL LOW (ref 8.9–10.3)
Chloride: 102 mmol/L (ref 101–111)
Creatinine, Ser: 1.13 mg/dL (ref 0.61–1.24)
GFR calc Af Amer: >60 mL/min (ref >60)
Glucose, Bld: 284 mg/dL — ABNORMAL HIGH (ref 65–99)
POTASSIUM: 3.9 mmol/L (ref 3.5–5.1)
Sodium: 136 mmol/L (ref 135–145)
TOTAL PROTEIN: 6.7 g/dL (ref 6.5–8.1)

## 2017-09-22 LAB — MRSA PCR SCREENING: MRSA by PCR: NEGATIVE

## 2017-09-22 LAB — CBC
HEMATOCRIT: 40.6 % (ref 39.0–52.0)
HEMOGLOBIN: 14 g/dL (ref 13.0–17.0)
MCH: 31.1 pg (ref 26.0–34.0)
MCHC: 34.5 g/dL (ref 30.0–36.0)
MCV: 90.2 fL (ref 78.0–100.0)
Platelets: 204 10*3/uL (ref 150–400)
RBC: 4.5 MIL/uL (ref 4.22–5.81)
RDW: 12.8 % (ref 11.5–15.5)
WBC: 9 10*3/uL (ref 4.0–10.5)

## 2017-09-22 LAB — TROPONIN I
TROPONIN I: 0.03 ng/mL — AB (ref <0.03)
TROPONIN I: 0.09 ng/mL — AB (ref <0.03)
Troponin I: <0.03 ng/mL (ref <0.03)

## 2017-09-22 LAB — CBG MONITORING, ED: Glucose-Capillary: 301 mg/dL — ABNORMAL HIGH (ref 65–99)

## 2017-09-22 LAB — TSH: TSH: 0.953 u[IU]/mL (ref 0.350–4.500)

## 2017-09-22 LAB — HEMOGLOBIN A1C
Hgb A1c MFr Bld: 12.4 % — ABNORMAL HIGH (ref 4.8–5.6)
MEAN PLASMA GLUCOSE: 309.18 mg/dL

## 2017-09-22 LAB — BETA-HYDROXYBUTYRIC ACID: Beta-Hydroxybutyric Acid: 4.62 mmol/L — ABNORMAL HIGH (ref 0.05–0.27)

## 2017-09-22 LAB — PHOSPHORUS: PHOSPHORUS: 2.6 mg/dL (ref 2.5–4.6)

## 2017-09-22 LAB — MAGNESIUM: MAGNESIUM: 1.7 mg/dL (ref 1.7–2.4)

## 2017-09-22 MED ORDER — POTASSIUM CHLORIDE 10 MEQ/100ML IV SOLN
10.0000 meq | INTRAVENOUS | Status: AC
  Administered 2017-09-22 (×2): 10 meq via INTRAVENOUS
  Filled 2017-09-22 (×2): qty 100

## 2017-09-22 MED ORDER — SODIUM CHLORIDE 0.9 % IV SOLN: INTRAVENOUS | Status: AC

## 2017-09-22 MED ORDER — INSULIN ASPART 100 UNIT/ML ~~LOC~~ SOLN
0.0000 [IU] | Freq: Three times a day (TID) | SUBCUTANEOUS | Status: DC
  Administered 2017-09-23: 2 [IU] via SUBCUTANEOUS
  Administered 2017-09-23: 3 [IU] via SUBCUTANEOUS

## 2017-09-22 MED ORDER — ACETAMINOPHEN 650 MG RE SUPP: 650.0000 mg | Freq: Four times a day (QID) | RECTAL | Status: DC | PRN

## 2017-09-22 MED ORDER — ASPIRIN EC 81 MG PO TBEC
81.0000 mg | DELAYED_RELEASE_TABLET | Freq: Every day | ORAL | Status: DC
  Administered 2017-09-22 – 2017-09-24 (×3): 81 mg via ORAL
  Filled 2017-09-22 (×3): qty 1

## 2017-09-22 MED ORDER — METOCLOPRAMIDE HCL 10 MG PO TABS: 10.0000 mg | ORAL_TABLET | Freq: Three times a day (TID) | ORAL | Status: DC

## 2017-09-22 MED ORDER — INSULIN DETEMIR 100 UNIT/ML ~~LOC~~ SOLN
25.0000 [IU] | SUBCUTANEOUS | Status: DC
  Administered 2017-09-22 – 2017-09-23 (×2): 25 [IU] via SUBCUTANEOUS
  Filled 2017-09-22 (×4): qty 0.25

## 2017-09-22 MED ORDER — METOCLOPRAMIDE HCL 5 MG/ML IJ SOLN
10.0000 mg | Freq: Four times a day (QID) | INTRAMUSCULAR | Status: DC
  Administered 2017-09-22 – 2017-09-23 (×5): 10 mg via INTRAVENOUS
  Filled 2017-09-22 (×4): qty 2

## 2017-09-22 MED ORDER — DEXTROSE-NACL 5-0.45 % IV SOLN
INTRAVENOUS | Status: DC
  Administered 2017-09-22: 75 mL via INTRAVENOUS

## 2017-09-22 MED ORDER — SODIUM CHLORIDE 0.9 % IV SOLN: INTRAVENOUS | Status: DC

## 2017-09-22 MED ORDER — CLOPIDOGREL BISULFATE 75 MG PO TABS
75.0000 mg | ORAL_TABLET | Freq: Every day | ORAL | Status: DC
  Administered 2017-09-22 – 2017-09-24 (×3): 75 mg via ORAL
  Filled 2017-09-22 (×3): qty 1

## 2017-09-22 MED ORDER — ACETAMINOPHEN 325 MG PO TABS
650.0000 mg | ORAL_TABLET | Freq: Four times a day (QID) | ORAL | Status: DC | PRN
Start: 2017-09-22 — End: 2017-09-24
  Administered 2017-09-22: 650 mg via ORAL
  Filled 2017-09-22: qty 2

## 2017-09-22 MED ORDER — SODIUM CHLORIDE 0.9 % IV SOLN
INTRAVENOUS | Status: DC
  Administered 2017-09-22: 2.4 [IU]/h via INTRAVENOUS
  Filled 2017-09-22: qty 1

## 2017-09-22 MED ORDER — PROMETHAZINE HCL 25 MG PO TABS
25.0000 mg | ORAL_TABLET | Freq: Three times a day (TID) | ORAL | Status: DC | PRN
  Administered 2017-09-22: 25 mg via ORAL
  Filled 2017-09-22: qty 1

## 2017-09-22 MED ORDER — RANOLAZINE ER 500 MG PO TB12
500.0000 mg | ORAL_TABLET | Freq: Two times a day (BID) | ORAL | Status: DC
Start: 2017-09-22 — End: 2017-09-24
  Administered 2017-09-22 – 2017-09-24 (×4): 500 mg via ORAL
  Filled 2017-09-22 (×6): qty 1

## 2017-09-22 MED ORDER — METOPROLOL TARTRATE 25 MG PO TABS
12.5000 mg | ORAL_TABLET | Freq: Two times a day (BID) | ORAL | Status: DC
  Administered 2017-09-23 – 2017-09-24 (×3): 12.5 mg via ORAL
  Filled 2017-09-22 (×3): qty 1

## 2017-09-22 MED ORDER — DEXTROSE-NACL 5-0.45 % IV SOLN: INTRAVENOUS | Status: AC

## 2017-09-22 MED ORDER — SODIUM CHLORIDE 0.9 % IV SOLN
INTRAVENOUS | Status: AC
  Filled 2017-09-22: qty 1

## 2017-09-22 MED ORDER — INSULIN ASPART 100 UNIT/ML ~~LOC~~ SOLN: 0.0000 [IU] | Freq: Every day | SUBCUTANEOUS | Status: DC

## 2017-09-22 MED ORDER — APIXABAN 5 MG PO TABS
5.0000 mg | ORAL_TABLET | Freq: Two times a day (BID) | ORAL | Status: DC
  Administered 2017-09-22 – 2017-09-24 (×5): 5 mg via ORAL
  Filled 2017-09-22 (×5): qty 1

## 2017-09-22 MED ORDER — ONDANSETRON HCL 4 MG/2ML IJ SOLN
4.0000 mg | Freq: Four times a day (QID) | INTRAMUSCULAR | Status: DC | PRN
  Administered 2017-09-22: 4 mg via INTRAVENOUS
  Filled 2017-09-22: qty 2

## 2017-09-22 MED ORDER — PROMETHAZINE HCL 25 MG/ML IJ SOLN
12.5000 mg | Freq: Four times a day (QID) | INTRAMUSCULAR | Status: DC | PRN
Start: 2017-09-22 — End: 2017-09-24
  Administered 2017-09-22 – 2017-09-23 (×2): 12.5 mg via INTRAVENOUS
  Filled 2017-09-22 (×2): qty 1

## 2017-09-22 MED ORDER — SODIUM CHLORIDE 0.9 % IV SOLN
INTRAVENOUS | Status: AC
  Administered 2017-09-22: 21:00:00 via INTRAVENOUS

## 2017-09-22 MED ORDER — GABAPENTIN 300 MG PO CAPS
300.0000 mg | ORAL_CAPSULE | Freq: Three times a day (TID) | ORAL | Status: DC
  Administered 2017-09-22 – 2017-09-24 (×8): 300 mg via ORAL
  Filled 2017-09-22 (×8): qty 1

## 2017-09-22 MED ORDER — ALBUTEROL SULFATE (2.5 MG/3ML) 0.083% IN NEBU: 2.5000 mg | INHALATION_SOLUTION | RESPIRATORY_TRACT | Status: DC | PRN

## 2017-09-22 MED ORDER — ATORVASTATIN CALCIUM 40 MG PO TABS: 80.0000 mg | ORAL_TABLET | Freq: Every day | ORAL | Status: DC

## 2017-09-22 NOTE — Progress Notes (Addendum)
[facility] PROGRESS NOTE  Samuel [name]:[id number] DOB: Mar 30, 1951 DOA: 09/21/2017  PCP: [name], MD  Brief History/Interval Summary: 67 year old male with a past medical history of insulin-dependent diabetes, coronary artery disease status post stenting, pulmonary embolism, status post left BKA, GERD, sleep apnea and gastroparesis who did not take his insulin for a few days presented with nausea and vomiting.  Found to have diabetic ketoacidosis.  He was hospitalized for further management.  Reason for Visit: Diabetic ketoacidosis.  Diabetic gastroparesis  Consultants: None  Procedures: None  Antibiotics: None  Subjective/Interval History: Patient continues to have nausea and episodes of vomiting this morning.  Denies any abdominal pain.  No shortness of breath.  Patient's cousin is at the bedside.  ROS: Denies any chest pain.  No headaches.  Objective:  Vital Signs  Vitals:   09/22/17 0600 09/22/17 0700 09/22/17 0800 09/22/17 0900  BP: (!) 110/50 (!) 116/50 (!) 138/52 (!) 151/63  Pulse: (!) 106 (!) 106 (!) 107 (!) 109  Resp: 19 20 16 16   Temp:   98.3 F (36.8 C)   TempSrc:   Oral   SpO2: 98% 99% 100% 100%  Weight:        Intake/Output Summary (Last 24 hours) at 09/22/2017 1016 Last data filed at 09/22/2017 1000 Gross per 24 hour  Intake 1093.12 ml  Output 700 ml  Net 393.12 ml   Filed Weights   09/22/17 0534  Weight: 122.3 kg (269 lb 10 oz)    General appearance: alert, cooperative, appears stated age and no distress Resp: clear to auscultation bilaterally Cardio: regular rate and rhythm, S1, S2 normal, no murmur, click, rub or gallop GI: soft, non-tender; bowel sounds normal; no masses,  no organomegaly Extremities: Left BKA.  Chronic skin changes in the right leg with old wound which is healed. Pulses: Poorly palpable in the lower extremities. Neurologic: No focal neurological deficits.  Lab Results:  Data Reviewed: I have personally  reviewed following labs and imaging studies  CBC: Recent Labs  Lab 09/21/17 2200 09/22/17 0354  WBC 8.9 9.0  HGB 15.7 14.0  HCT 44.8 40.6  MCV 89.6 90.2  PLT 211 204    Basic Metabolic Panel: Recent Labs  Lab 09/21/17 2200 09/22/17 0354  NA 138 136  K 4.2 3.9  CL 99* 102  CO2 19* 16*  GLUCOSE 328* 284*  BUN 15 15  CREATININE 1.14 1.13  CALCIUM 9.3 8.5*  MG  --  1.7  PHOS  --  2.6    GFR: Estimated Creatinine Clearance: 93 mL/min (by C-G formula based on SCr of 1.13 mg/dL).  Liver Function Tests: Recent Labs  Lab 09/21/17 2200 09/22/17 0354  AST 25 18  ALT 23 20  ALKPHOS 99 80  BILITOT 1.6* 1.4*  PROT 7.7 6.7  ALBUMIN 3.7 3.1*    Cardiac Enzymes: Recent Labs  Lab 09/22/17 0354  TROPONINI <0.03  <0.03    HbA1C: Recent Labs    09/22/17 0354  HGBA1C 12.4*    CBG: Recent Labs  Lab 09/22/17 0526 09/22/17 0642 09/22/17 0741 09/22/17 0841 09/22/17 1002  GLUCAP 211* 209* 220* 179* 171*     Thyroid Function Tests: Recent Labs    09/22/17 0354  TSH 0.953    Recent Results (from the past 240 hour(s))  MRSA PCR Screening     Status: None   Collection Time: 09/22/17  3:14 AM  Result Value Ref Range Status   MRSA by PCR NEGATIVE NEGATIVE Final  Comment:        The GeneXpert MRSA Assay (FDA approved for NASAL specimens only), is one component of a comprehensive MRSA colonization surveillance program. It is not intended to diagnose MRSA infection nor to guide or monitor treatment for MRSA infections. Performed at [facility], [address] [place], [place] [address]       Radiology Studies: Dg Abd 1 View  Result Date: 09/22/2017 CLINICAL DATA:  DKA type 2.  Small-bowel obstruction EXAM: ABDOMEN - 1 VIEW COMPARISON:  08/02/2017 FINDINGS: Normal bowel gas pattern. Negative for bowel obstruction. IVC filter at tooth L2-3 unchanged. Negative for urinary tract calculi. IMPRESSION: Normal bowel gas pattern  Electronically Signed   By: Marlan Palau M.D.   On: 09/22/2017 08:08   Portable Chest X-ray (1 View)  Result Date: 09/22/2017 CLINICAL DATA:  DKA type 2 EXAM: PORTABLE CHEST 1 VIEW COMPARISON:  08/02/2017 FINDINGS: Mild bibasilar atelectasis. Negative for heart failure or pneumonia. No pleural effusion. IMPRESSION: Mild bibasilar atelectasis. Electronically Signed   By: Marlan Palau M.D.   On: 09/22/2017 08:09     Medications:  Scheduled: . apixaban  5 mg Oral BID  . aspirin EC  81 mg Oral Daily  . clopidogrel  75 mg Oral Daily  . gabapentin  300 mg Oral TID  . metoCLOPramide (REGLAN) injection  10 mg Intravenous Q6H  . [START ON 09/23/2017] metoprolol tartrate  12.5 mg Oral BID  . ranolazine  500 mg Oral BID   Continuous: . sodium chloride Stopped (09/22/17 0324)  . dextrose 5 % and 0.45% NaCl 75 mL/hr at 09/22/17 0700  . insulin (NOVOLIN-R) infusion 5.6 Units/hr (09/22/17 1012)   UEA:[id number] **OR** acetaminophen, albuterol, ondansetron (ZOFRAN) IV, promethazine, promethazine  Assessment/Plan:  Active Problems:   CAD S/P DES PCI to mLAD: Xience DES 2.75 x 15 (3.0 mm)   Essential hypertension   Hyperlipidemia due to type 2 diabetes mellitus (HCC)   Gastroparesis   Intractable nausea and vomiting   S/P BKA (below knee amputation) (HCC)   COPD (chronic obstructive pulmonary disease) (HCC)   DKA, type 2, not at goal [facility])    Diabetic ketoacidosis Likely a result of noncompliance.  Patient did not take his insulin for a few days while he was at a funeral.  Patient started on the DKA protocol with insulin infusion.  Wait for anion gap to close.  Also wait for his symptoms to subside.  Continues to have nausea vomiting.  No infectious etiology has been found.  HbA1c 12.4.  History of diabetic gastroparesis with intractable nausea and vomiting Given Reglan on a scheduled basis intravenously.  Use Phenergan as needed.  Keep n.p.o. for now.  Abdominal films did not show any  obstruction.  Hyperlipidemia Continue statin when able to take orally.  Essential hypertension Monitor blood pressures closely.  Resume his home medications when able.  History of coronary artery disease Continue home medications.  Stable.  No chest pain.  History of COPD Currently stable.  History of obstructive sleep apnea Does not use CPAP  Obesity Body mass index is 33.7 kg/m.  Will need weight loss counseling.  Remote history of pulmonary embolism Continue Eliquis.  Sounds like he has been noncompliant with this as well.  DVT Prophylaxis: Eliquis    Code Status: DNR Family Communication: Discussed with the patient and at his request discussed with his brother as well. Disposition Plan: Continue stepdown setting.  Wait for clinical improvement.    LOS: 0 days  Osvaldo ShipperGokul Ryan Palermo  [facility] Pager [phone] 09/22/2017, 10:16 AM  If 7PM-7AM, please contact night-coverage at [email or web address], password [email or web address]

## 2017-09-22 NOTE — ED Notes (Signed)
Unable to obtain blood sample for the ordered I-stat lactic acid, including IV team member attempt.

## 2017-09-22 NOTE — H&P (Signed)
Samuel BusingJohn D Samet ZOX:[id number] DOB: 1950/09/29 DOA: 09/21/2017     PCP: Pearson GrippeKim, James, MD   Outpatient Specialists:   CARDS:  Dr.Berry   Patient arrived to ER on 09/21/17 at 2133  Patient coming from:   home Lives alone,       Chief Complaint:  Chief Complaint  Patient presents with  . Gastroparesis N/V    HPI: Samuel BusingJohn D Seres is a 67 y.o. male with medical history significant of DM 2 on insulin, CAD s/p stenting in 2010, PE, s/p Lt BKA, GERD, OSA, and HTN, gastropearesis noncompliance  Presented with nausea and vomiting.  Had not used his insulin for 2 days because he was going to a funeral. and have not taken any of his home medications due to vomiting.   No associated abdominal pain patient called EMS and was given 4 mg of IV Zofran but did not seem to help patient states that he only response to Phenergan.  Reports he has Phenergan at home but could not keep it down secondary to repeated vomiting  bowel movement today was slightly loose.  He has had a total of 6 nonbloody emesis today. Reports chills.  Has history of recurrent ever ER trips for gastroparesis at home on Compazine and Phenergan last time presented emergency department February  Regarding pertinent Chronic problems:  PE in 2012 but quit taking Eliquis secondary to costs now restarted. Has known history of CAD at some point was considered for CABG but felt to be have low chances of good recovery in was not pursued  While in ER: Given elevated blood sugars evidence of anion gap acidosis and ketones in urine was found to possibly be in  mild DKA  Following Medications were ordered in ER: Medications  dextrose 5 %-0.45 % sodium chloride infusion (has no administration in time range)  insulin regular (NOVOLIN R,HUMULIN R) 100 Units in sodium chloride 0.9 % 100 mL (1 Units/mL) infusion (has no administration in time range)  potassium chloride 10 mEq in 100 mL IVPB (has no administration in time range)  sodium chloride 0.9 %  bolus 1,000 mL (1,000 mLs Intravenous New Bag/Given 09/21/17 2314)  promethazine (PHENERGAN) injection 12.5 mg (12.5 mg Intravenous Given 09/21/17 2315)  famotidine (PEPCID) IVPB 20 mg premix (20 mg Intravenous New Bag/Given 09/21/17 2314)  dicyclomine (BENTYL) injection 20 mg (20 mg Intramuscular Given 09/21/17 2315)    Significant initial  Findings: Abnormal Labs Reviewed  COMPREHENSIVE METABOLIC PANEL - Abnormal; Notable for the following components:      Result Value   Chloride 99 (*)    CO2 19 (*)    Glucose, Bld 328 (*)    Total Bilirubin 1.6 (*)    Anion gap 20 (*)    All other components within normal limits  URINALYSIS, ROUTINE W REFLEX MICROSCOPIC - Abnormal; Notable for the following components:   Color, Urine STRAW (*)    Glucose, UA >=500 (*)    Hgb urine dipstick SMALL (*)    Ketones, ur 80 (*)    All other components within normal limits  CBG MONITORING, ED - Abnormal; Notable for the following components:   Glucose-Capillary 333 (*)    All other components within normal limits     Na 138 K 4.2  Cr   stable,   Lab Results  Component Value Date   CREATININE 1.14 09/21/2017   CREATININE 1.25 (H) 08/02/2017   CREATININE 1.21 05/19/2017      WBC 8.9  HG/HCT  Stable,     Component Value Date/Time   HGB 15.7 09/21/2017 2200   HCT 44.8 09/21/2017 2200      BNP (last 3 results) Recent Labs    11/19/16 0513  BNP 62.1    ProBNP (last 3 results) No results for input(s): PROBNP in the last 8760 hours.  Lactic Acid, Venous    Component Value Date/Time   LATICACIDVEN 1.9 03/07/2015 0810      UA   no evidence of UTI      ECG: Ordered Personally reviewed by me showing: HR : 102 Rhythm: sinus tachycardia   no evidence of ischemic changes [id number]    ED Triage Vitals [09/21/17 2207]  Enc Vitals Group     BP 115/63     Pulse Rate 90     Resp (!) 24     Temp 97.8 F (36.6 C)     Temp Source Oral     SpO2 100 %     Weight      Height      Head  Circumference      Peak Flow      Pain Score 0     Pain Loc      Pain Edu?      Excl. in GC?   [id number](47)@       Latest  Blood pressure 115/63, pulse 90, temperature 97.8 F (36.6 C), temperature source Oral, resp. rate (!) 24, SpO2 100 %.     Hospitalist was called for admission for gastroparesis exacerbation with possible mild DKA   Review of Systems:    Pertinent positives include:  nausea, vomiting,  Constitutional:  No weight loss, night sweats, Fevers, chills, fatigue, weight loss  HEENT:  No headaches, Difficulty swallowing,Tooth/dental problems,Sore throat,  No sneezing, itching, ear ache, nasal congestion, post nasal drip,  Cardio-vascular:  No chest pain, Orthopnea, PND, anasarca, dizziness, palpitations.no Bilateral lower extremity swelling  GI:  No heartburn, indigestion, abdominal pain, diarrhea, change in bowel habits, loss of appetite, melena, blood in stool, hematemesis Resp:  no shortness of breath at rest. No dyspnea on exertion, No excess mucus, no productive cough, No non-productive cough, No coughing up of blood.No change in color of mucus.No wheezing. Skin:  no rash or lesions. No jaundice GU:  no dysuria, change in color of urine, no urgency or frequency. No straining to urinate.  No flank pain.  Musculoskeletal:  No joint pain or no joint swelling. No decreased range of motion. No back pain.  Psych:  No change in mood or affect. No depression or anxiety. No memory loss.  Neuro: no localizing neurological complaints, no tingling, no weakness, no double vision, no gait abnormality, no slurred speech, no confusion  As per HPI otherwise 10 point review of systems negative.   Past Medical History:   Past Medical History:  Diagnosis Date  . Anxiety   . Arthritis    "all over"   . CAD (coronary artery disease)   . Charcot's joint    "left foot"  . 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 Depression   . Gastroparesis   . GERD  (gastroesophageal reflux disease)   . H/O hiatal hernia   . Hyperlipidemia   . Hypertension   . Myocardial infarction (HCC) 2017/03/27   around this date  . OSA (obstructive sleep apnea)    "not bad enough for a mask"  . Peripheral neuropathy   . Peripheral vascular disease (HCC)   .  PONV (postoperative nausea and vomiting)   . Pulmonary embolism (HCC)    hx. of 2012  . Shortness of breath    exertion  . Type II diabetes mellitus (HCC)       Past Surgical History:  Procedure Laterality Date  . AMPUTATION Left 03/08/2015   Procedure:  LEFT AMPUTATION BELOW KNEE;  Surgeon: Toni Arthurs, MD;  Location: [facility];  Service: Orthopedics;  Laterality: Left;  . APPLICATION OF WOUND VAC Left 01/07/2014  . CHOLECYSTECTOMY  06/2010  . CORONARY ANGIOPLASTY WITH STENT PLACEMENT  05/2009   "1"  . FOOT SURGERY Left 2010   "for Charcot's joint"  . HARDWARE REMOVAL Left 01/07/2014   Procedure: LEFT LEG REMOVAL OF DEEP IMPLANT AND SEQUESTRECTOMY; APPLICATION OF WOUND VAC ;  Surgeon: Toni Arthurs, MD;  Location: [facility] OR;  Service: Orthopedics;  Laterality: Left;  . I&D EXTREMITY Left "multiple"   leg  . 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 IM NAILING TIBIA Left ~ 2012  . LEFT HEART CATH AND CORONARY ANGIOGRAPHY N/A 04/08/2017   Procedure: LEFT HEART CATH AND CORONARY ANGIOGRAPHY;  Surgeon: Runell Gess, MD;  Location: [facility]AS[facility];  Service: Cardiovascular;  Laterality: N/A;  . SHOULDER ARTHROSCOPY W/ ROTATOR CUFF REPAIR Left    "and bone spurs"  . TIBIAL IM ROD REMOVAL Left 01/07/2014  . TOE AMPUTATION Right ~ 2011   "great toe"  . VENA CAVA FILTER PLACEMENT  2012  . VENA CAVA FILTER PLACEMENT N/A 11/20/2016   Procedure: INSERTION VENA-CAVA FILTER;  Surgeon: Sherren Kerns, MD;  Location: [facility] OR;  Service: Vascular;  Laterality: N/A;  . WOUND  DEBRIDEMENT Left 01/07/2014   "tibia"    Social History:  Ambulatory  Independently or wheelchair       reports that he has never smoked. He has never used smokeless tobacco. He reports that he does not drink alcohol or use drugs.     Family History:   Family History  Problem Relation Age of Onset  . COPD Mother   . Heart disease Mother   . Lung cancer Mother   . Retinal detachment Father   . Alcoholism Brother   . Heart disease Brother   . COPD Brother   . Diabetes Brother   . Hypertension Brother   . Stroke Brother     Allergies: Allergies  Allergen Reactions  . Gabapentin Hives, Itching and Rash    REACTION: rash/hives (per patient, was a reaction to an inactive ingredient in another mgf brand). The patient stated that he does take this medication now and it doesn't cause a rash any more.  . Nabumetone Itching, Nausea Only and Rash  . Codeine Other (See Comments)    GI UPSET     Prior to Admission medications   Medication Sig Start Date End Date Taking? Authorizing Provider  acarbose (PRECOSE) 50 MG tablet Take 50 mg 3 (three) times daily with meals by mouth.   Yes [name], MD  amLODipine (NORVASC) 10 MG tablet Take 1 tablet (10 mg total) by mouth daily. 11/22/16  Yes Richarda Overlie, MD  apixaban (ELIQUIS) 5 MG TABS tablet Take 1 tablet (5 mg total) by mouth 2 (two) times daily. 04/11/17 04/11/18 Yes Glade Lloyd, MD  aspirin 81 MG EC tablet Take 1 tablet (81 mg total) by mouth daily. 04/12/17  Yes  Glade Lloyd, MD  atorvastatin (LIPITOR) 80 MG tablet Take 1 tablet (80 mg total) by mouth daily at 6 PM. 04/11/17  Yes Alekh, Kshitiz, MD  Canagliflozin (INVOKANA) 100 MG TABS Take 100 mg by mouth See admin instructions. invokana 100 mg daily. Alternating every other week between Jardiance and Invokana   Yes [provider]  clopidogrel (PLAVIX) 75 MG tablet Take 1 tablet (75 mg total) by mouth daily. 04/12/17  Yes Glade Lloyd, MD    diphenhydramine-acetaminophen (TYLENOL PM) 25-500 MG TABS tablet Take 1 tablet by mouth at bedtime.   Yes [provider]  docusate sodium (COLACE) 100 MG capsule Take 100 mg by mouth daily as needed (for constipation).    Yes [provider]  empagliflozin (JARDIANCE) 25 MG TABS tablet Take 10 mg See admin instructions by mouth. Alternating every other week between Jardiance and Invokana   Yes [provider]  ferrous sulfate 325 (65 FE) MG tablet Take 325 mg by mouth every evening.    Yes [provider]  furosemide (LASIX) 20 MG tablet Take 1 tablet (20 mg total) by mouth daily. Patient taking differently: Take 20 mg by mouth 2 (two) times daily.  04/12/17  Yes Glade Lloyd, MD  gabapentin (NEURONTIN) 300 MG capsule Take 300 mg by mouth 3 (three) times daily.   Yes [provider]  ibuprofen (ADVIL,MOTRIN) 200 MG tablet Take 400 mg every 6 (six) hours as needed by mouth for headache or pain.   Yes [provider]  insulin detemir (LEVEMIR) 100 UNIT/ML injection Inject 85 Units into the skin 2 (two) times daily.    Yes [provider]  insulin lispro (HUMALOG) 100 UNIT/ML injection Inject 0-10 Units into the skin 3 (three) times daily with meals. Inject SQ per sliding scale 150-250 (5 units), 251-350(8 units), 351-450(10 units), >450=10 units   Yes [provider]  lansoprazole (PREVACID) 30 MG capsule Take 30 mg by mouth 2 (two) times daily. 03/19/17  Yes [provider]  metoCLOPramide (REGLAN) 5 MG tablet Take 10 mg by mouth at bedtime.    Yes [provider]  metoprolol tartrate (LOPRESSOR) 25 MG tablet Take 1 tablet (25 mg total) by mouth 2 (two) times daily. 04/11/17  Yes Glade Lloyd, MD  nitroGLYCERIN (NITROSTAT) 0.4 MG SL tablet Place 1 tablet (0.4 mg total) under the tongue every 5 (five) minutes as needed for chest pain. 04/11/17  Yes Glade Lloyd, MD  pioglitazone (ACTOS) 30 MG tablet Take 30  mg by mouth at bedtime.    Yes [provider]  polyethylene glycol (MIRALAX / GLYCOLAX) packet Take 17 g by mouth daily as needed for moderate constipation. 04/11/17  Yes Glade Lloyd, MD  prochlorperazine (COMPAZINE) 10 MG tablet Take 10 mg by mouth daily as needed for nausea/vomiting. 04/01/17  Yes [provider]  promethazine (PHENERGAN) 25 MG tablet Take 25 mg by mouth every 8 (eight) hours as needed for nausea or vomiting.    Yes [provider]  ranitidine (ZANTAC) 150 MG tablet Take 150 mg by mouth at bedtime.    Yes [provider]  ranolazine (RANEXA) 500 MG 12 hr tablet Take 1 tablet (500 mg total) by mouth 2 (two) times daily. 04/11/17  Yes Glade Lloyd, MD  sitaGLIPtin (JANUVIA) 100 MG tablet Take 100 mg daily by mouth.   Yes [name], MD  traMADol (ULTRAM) 50 MG tablet Take 100 mg by mouth every 12 (twelve) hours as needed (pain).   Yes  [provider]   Physical Exam: Blood pressure 115/63, pulse 90, temperature 97.8 F (36.6 C), temperature source Oral, resp. rate (!) 24, SpO2 100 %. 1. General:  in No Acute distress  Chronically ill  -appearing 2. Psychological: Alert and Oriented 3. Head/ENT:    Dry Mucous Membranes                          Head Non traumatic, neck supple                           Poor Dentition 4. SKIN:  decreased Skin turgor,  Skin clean Dry and intact no rash 5. Heart: Regular rate and rhythm no  Murmur, no Rub or gallop 6. Lungs:   no wheezes or crackles   7. Abdomen: Soft, non-tender, Non distended  Obese  8. Lower extremities: no clubbing, cyanosis, or edema 9. Neurologically Grossly intact, moving all 4 extremities equally   10. MSK: Normal range of motion   LABS:     Recent Labs  Lab 09/21/17 2200  WBC 8.9  HGB 15.7  HCT 44.8  MCV 89.6  PLT 211   Basic Metabolic Panel: Recent Labs  Lab 09/21/17 2200  NA 138  K 4.2  CL 99*  CO2 19*  GLUCOSE 328*  BUN 15  CREATININE 1.14    CALCIUM 9.3      Recent Labs  Lab 09/21/17 2200  AST 25  ALT 23  ALKPHOS 99  BILITOT 1.6*  PROT 7.7  ALBUMIN 3.7   No results for input(s): LIPASE, AMYLASE in the last 168 hours. No results for input(s): AMMONIA in the last 168 hours.    HbA1C: No results for input(s): HGBA1C in the last 72 hours. CBG: Recent Labs  Lab 09/21/17 2200  GLUCAP 333*      Urine analysis:    Component Value Date/Time   COLORURINE STRAW (A) 09/21/2017 2208   APPEARANCEUR CLEAR 09/21/2017 2208   LABSPEC 1.027 09/21/2017 2208   PHURINE 6.0 09/21/2017 2208   GLUCOSEU >=500 (A) 09/21/2017 2208   HGBUR SMALL (A) 09/21/2017 2208   BILIRUBINUR NEGATIVE 09/21/2017 2208   KETONESUR 80 (A) 09/21/2017 2208   PROTEINUR NEGATIVE 09/21/2017 2208   UROBILINOGEN 1.0 04/05/2015 1155   NITRITE NEGATIVE 09/21/2017 2208   LEUKOCYTESUR NEGATIVE 09/21/2017 2208       Cultures:    Component Value Date/Time   SDES BLOOD LEFT FOREARM 03/07/2015 0448   SPECREQUEST BOTTLES DRAWN AEROBIC AND ANAEROBIC 5CC 03/07/2015 0448   CULT  03/07/2015 0448    NO GROWTH 5 DAYS Performed at [facility]    REPTSTATUS 03/12/2015 FINAL 03/07/2015 0448     Radiological Exams on Admission: No results found.  Chart has been reviewed    Assessment/Plan  67 y.o. male with medical history significant of DM 2 on insulin, CAD s/p stenting in 2010, PE, s/p Lt BKA, GERD, OSA, and HTN, gastropearesis noncompliance   Admitted for gastroparesis exacerbation with possible mild DKA   Present on Admission: . DKA, type 2, not at goal [facility]) - will admit per DKA protocol, obtain serial BMET, start on glucosestabalizer, aggressive IVF.   [name] Will work up cause of DKA with CXR, ECG one set of cardiac enzymes, UA. Monitor in [place]. Replace potassium as needed.     . Intractable nausea and vomiting likely secondary to gastroparesis, will obtain KUB, denies any recent sick contacts  or exposure to tainted food.  .  Hyperlipidemia due to type 2 diabetes mellitus (HCC) continue statin when able to tolerate . Gastroparesis -try to avoid opioids patient have responded well to Phenergan in the past will try very use again and use Reglan as needed . Essential hypertension -stable resume home medications when able to tolerate . COPD (chronic obstructive pulmonary disease) (HCC) -currently stable, patient denies OSA - does not use CPAP  CAD-  - chronic, continue aspirin  and statin  and beta blocker when able to tolerate PO restart plavix and ranexa  Remote hx of PE - continue eliquis   Other plan as per orders.  DVT prophylaxis:  Eliquis  Code Status:    DNR/DNI  as per patient   I had personally discussed CODE STATUS with patient and family     Family Communication:   Family at  Bedside  plan of care was discussed with cousin  Disposition Plan:     To home once workup is complete and patient is stable     Diabetes coordinator    consulted                          Consults called: NOne    Admission status:   obs   Level of care    SDU         Alfonse Garringer 09/22/2017, 1:11 AM    [facility]  Pager [phone]   after 2 AM please page floor coverage PA If 7AM-7PM, please contact the day team taking care of the patient  [email or web address]  Password [email or web address]

## 2017-09-22 NOTE — ED Notes (Signed)
ED TO INPATIENT HANDOFF REPORT  Name/Age/Gender Samuel [name] 67 y.o. male  Code Status Code Status History    Date Active Date Inactive Code Status Order ID Comments User Context   04/11/2017 1928 04/11/2017 2250 DNR [id number]  Kayleen Memos, DO Inpatient   04/10/2017 1512 04/11/2017 1928 DNR [id number]  Aline August, MD Inpatient   04/07/2017 1824 04/10/2017 1512 Full Code [id number]  Phillips Grout, MD Inpatient   11/19/2016 0050 11/21/2016 1721 Full Code [id number]  Bethena Roys, MD Inpatient   03/08/2015 1533 03/15/2015 1707 Full Code [id number]  Corky Sing, PA-C Inpatient   03/07/2015 0734 03/08/2015 1533 DNR [id number]  Rise Patience, MD Inpatient   01/07/2014 1951 01/11/2014 1833 Full Code [id number]  Wylene Simmer, MD Inpatient   12/21/2013 0229 12/23/2013 1822 Full Code [id number]  Theressa Millard, MD Inpatient    Questions for Most Recent Historical Code Status (Order [id number])    Question Answer Comment   In the event of cardiac or respiratory ARREST Do not call a "code blue"    In the event of cardiac or respiratory ARREST Do not perform Intubation, CPR, defibrillation or ACLS    In the event of cardiac or respiratory ARREST Use medication by any route, position, wound care, and other measures to relive pain and suffering. May use oxygen, suction and manual treatment of airway obstruction as needed for comfort.       Home/SNF/Other Home  Chief Complaint nausea and vomiting  Level of Care/Admitting Diagnosis ED Disposition    ED Disposition Condition Comment   Admit  Hospital Area: [place] [[id number]]  Level of Care: Stepdown [14]  Admit to SDU based on following criteria: Hemodynamic compromise or significant risk of instability:  Patient requiring short term acute titration and management of vasoactive drips, and invasive monitoring (i.e., CVP and Arterial line).  Diagnosis: DKA, type 2, not at goal [facility]) [465035]  Admitting  Physician: Toy Baker [[id number]]  Attending Physician: Toy Baker [[id number]]  PT Class (Do Not Modify): Observation [104]  PT Acc Code (Do Not Modify): Observation [10022]       Medical History Past Medical History:  Diagnosis Date  . Anxiety   . Arthritis    "all over"   . CAD (coronary artery disease)   . Charcot's joint    "left foot"  . 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 Depression   . Gastroparesis   . GERD (gastroesophageal reflux disease)   . H/O hiatal hernia   . Hyperlipidemia   . Hypertension   . Myocardial infarction ([place]) 2017/03/27   around this date  . OSA (obstructive sleep apnea)    "not bad enough for a mask"  . Peripheral neuropathy   . Peripheral vascular disease ([place])   . PONV (postoperative nausea and vomiting)   . Pulmonary embolism ([place])    hx. of 2012  . Shortness of breath    exertion  . Type II diabetes mellitus (HCC)     Allergies Allergies  Allergen Reactions  . Gabapentin Hives, Itching and Rash    REACTION: rash/hives (per patient, was a reaction to an inactive ingredient in another mgf brand). The patient stated that he does take this medication now and it doesn't cause a rash any more.  . Nabumetone Itching, Nausea Only and Rash  . Codeine Other (See Comments)    GI UPSET    IV Location/Drains/Wounds Patient Lines/Drains/Airways Status   Active  Line/Drains/Airways    Name:   Placement date:   Placement time:   Site:   Days:   Peripheral IV 09/21/17 Left;Medial Wrist   09/21/17    2143    Wrist   1   Peripheral IV 09/22/17 Right;Anterior Forearm   09/22/17    0136    Forearm   less than 1          Labs/Imaging Results for orders placed or performed during the hospital encounter of 09/21/17 (from the past 48 hour(s))  CBG monitoring, ED     Status: Abnormal   Collection Time: 09/21/17 10:00 PM  Result Value Ref Range   Glucose-Capillary 333 (H) 65 - 99 mg/dL   Comment 1 Notify RN    Comprehensive metabolic panel     Status: Abnormal   Collection Time: 09/21/17 10:00 PM  Result Value Ref Range   Sodium 138 135 - 145 mmol/L   Potassium 4.2 3.5 - 5.1 mmol/L   Chloride 99 (L) 101 - 111 mmol/L   CO2 19 (L) 22 - 32 mmol/L   Glucose, Bld 328 (H) 65 - 99 mg/dL   BUN 15 6 - 20 mg/dL   Creatinine, Ser 1.14 0.61 - 1.24 mg/dL   Calcium 9.3 8.9 - 10.3 mg/dL   Total Protein 7.7 6.5 - 8.1 g/dL   Albumin 3.7 3.5 - 5.0 g/dL   AST 25 15 - 41 U/L   ALT 23 17 - 63 U/L   Alkaline Phosphatase 99 38 - 126 U/L   Total Bilirubin 1.6 (H) 0.3 - 1.2 mg/dL   GFR calc non Af Amer >60 >60 mL/min   GFR calc Af Amer >60 >60 mL/min    Comment: (NOTE) The eGFR has been calculated using the CKD EPI equation. This calculation has not been validated in all clinical situations. eGFR's persistently <60 mL/min signify possible Chronic Kidney Disease.    Anion gap 20 (H) 5 - 15    Comment: Performed at [facility], [place] [address], [place], [place] [address]  CBC     Status: None   Collection Time: 09/21/17 10:00 PM  Result Value Ref Range   WBC 8.9 4.0 - 10.5 K/uL   RBC 5.00 4.22 - 5.81 MIL/uL   Hemoglobin 15.7 13.0 - 17.0 g/dL   HCT 44.8 39.0 - 52.0 %   MCV 89.6 78.0 - 100.0 fL   MCH 31.4 26.0 - 34.0 pg   MCHC 35.0 30.0 - 36.0 g/dL   RDW 12.7 11.5 - 15.5 %   Platelets 211 150 - 400 K/uL    Comment: Performed at [facility], [place] [address] [place], [place] [address]  Urinalysis, Routine w reflex microscopic     Status: Abnormal   Collection Time: 09/21/17 10:08 PM  Result Value Ref Range   Color, Urine STRAW (A) YELLOW   APPearance CLEAR CLEAR   Specific Gravity, Urine 1.027 1.005 - 1.030   pH 6.0 5.0 - 8.0   Glucose, UA >=500 (A) NEGATIVE mg/dL   Hgb urine dipstick SMALL (A) NEGATIVE   Bilirubin Urine NEGATIVE NEGATIVE   Ketones, ur 80 (A) NEGATIVE mg/dL   Protein, ur NEGATIVE NEGATIVE mg/dL   Nitrite NEGATIVE NEGATIVE   Leukocytes, UA  NEGATIVE NEGATIVE   RBC / HPF 0-5 0 - 5 RBC/hpf   WBC, UA 0-5 0 - 5 WBC/hpf   Bacteria, UA NONE SEEN NONE SEEN   Squamous Epithelial / LPF NONE SEEN NONE SEEN  Comment: Performed at [facility], [place] [address], [place], [place] [address]  CBG monitoring, ED     Status: Abnormal   Collection Time: 09/22/17  1:15 AM  Result Value Ref Range   Glucose-Capillary 301 (H) 65 - 99 mg/dL   No results found.  Pending Labs Unresulted Labs (From admission, onward)   Start     Ordered   09/22/17 0036  Troponin I  Add-on,   R     09/22/17 0035   Signed and Held  Troponin I (q 6hr x 3)  Now then every 6 hours,   R     Signed and Held   Signed and Held  Magnesium  Tomorrow morning,   R    Comments:  Call MD if <1.5    Signed and Held   Signed and Held  Phosphorus  Tomorrow morning,   R     Signed and Held   Signed and Held  TSH  Once,   R    Comments:  Cancel if already done within 1 month and notify MD    Signed and Held   Signed and Held  Comprehensive metabolic panel  Once,   R    Comments:  Cal MD for K<3.5 or >5.0    Signed and Held   Signed and Held  CBC  Once,   R    Comments:  Call for hg <8.0    Signed and Held   Visual merchandiser and Held  Basic metabolic panel  STAT Now then every 4 hours ,   STAT     Signed and Held   Signed and Held  Beta-hydroxybutyric acid  Add-on,   R     Signed and Held   Signed and Held  Hemoglobin A1c  Tomorrow morning,   R     Signed and Held      Vitals/Pain Today's Vitals   09/21/17 2207 09/22/17 0154  BP: 115/63 117/63  Pulse: 90 (!) 102  Resp: (!) 24 14  Temp: 97.8 F (36.6 C)   TempSrc: Oral   SpO2: 100% 97%  PainSc: 0-No pain     Isolation Precautions No active isolations  Medications Medications  dextrose 5 %-0.45 % sodium chloride infusion ( Intravenous Hold 09/22/17 0058)  insulin regular (NOVOLIN R,HUMULIN R) 100 Units in sodium chloride 0.9 % 100 mL (1 Units/mL) infusion (2.4 Units/hr Intravenous New Bag/Given  09/22/17 0217)  potassium chloride 10 mEq in 100 mL IVPB (10 mEq Intravenous Transfusing/Transfer 09/22/17 0225)  promethazine (PHENERGAN) injection 12.5 mg (12.5 mg Intravenous Given 09/22/17 0212)  sodium chloride 0.9 % bolus 1,000 mL (0 mLs Intravenous Stopped 09/21/17 2344)  promethazine (PHENERGAN) injection 12.5 mg (12.5 mg Intravenous Given 09/21/17 2315)  famotidine (PEPCID) IVPB 20 mg premix (0 mg Intravenous Stopped 09/21/17 2344)  dicyclomine (BENTYL) injection 20 mg (20 mg Intramuscular Given 09/21/17 2315)    Mobility walks with device

## 2017-09-23 ENCOUNTER — Other Ambulatory Visit: Payer: Self-pay | Admitting: *Deleted

## 2017-09-23 ENCOUNTER — Other Ambulatory Visit: Payer: Self-pay

## 2017-09-23 DIAGNOSIS — E1143 Type 2 diabetes mellitus with diabetic autonomic (poly)neuropathy: Secondary | ICD-10-CM | POA: Diagnosis present

## 2017-09-23 DIAGNOSIS — E669 Obesity, unspecified: Secondary | ICD-10-CM | POA: Diagnosis present

## 2017-09-23 DIAGNOSIS — E119 Type 2 diabetes mellitus without complications: Secondary | ICD-10-CM | POA: Diagnosis not present

## 2017-09-23 DIAGNOSIS — I248 Other forms of acute ischemic heart disease: Secondary | ICD-10-CM | POA: Diagnosis present

## 2017-09-23 DIAGNOSIS — Z955 Presence of coronary angioplasty implant and graft: Secondary | ICD-10-CM | POA: Diagnosis not present

## 2017-09-23 DIAGNOSIS — E1110 Type 2 diabetes mellitus with ketoacidosis without coma: Secondary | ICD-10-CM | POA: Diagnosis present

## 2017-09-23 DIAGNOSIS — Z9114 Patient's other noncompliance with medication regimen: Secondary | ICD-10-CM | POA: Diagnosis not present

## 2017-09-23 DIAGNOSIS — E1165 Type 2 diabetes mellitus with hyperglycemia: Secondary | ICD-10-CM | POA: Diagnosis not present

## 2017-09-23 DIAGNOSIS — Z79899 Other long term (current) drug therapy: Secondary | ICD-10-CM | POA: Diagnosis not present

## 2017-09-23 DIAGNOSIS — J449 Chronic obstructive pulmonary disease, unspecified: Secondary | ICD-10-CM | POA: Diagnosis present

## 2017-09-23 DIAGNOSIS — Z86711 Personal history of pulmonary embolism: Secondary | ICD-10-CM | POA: Diagnosis not present

## 2017-09-23 DIAGNOSIS — Z7901 Long term (current) use of anticoagulants: Secondary | ICD-10-CM | POA: Diagnosis not present

## 2017-09-23 DIAGNOSIS — Z9861 Coronary angioplasty status: Secondary | ICD-10-CM

## 2017-09-23 DIAGNOSIS — K3184 Gastroparesis: Secondary | ICD-10-CM | POA: Diagnosis present

## 2017-09-23 DIAGNOSIS — I10 Essential (primary) hypertension: Secondary | ICD-10-CM | POA: Diagnosis present

## 2017-09-23 DIAGNOSIS — E1151 Type 2 diabetes mellitus with diabetic peripheral angiopathy without gangrene: Secondary | ICD-10-CM | POA: Diagnosis present

## 2017-09-23 DIAGNOSIS — IMO0002 Reserved for concepts with insufficient information to code with codable children: Secondary | ICD-10-CM | POA: Diagnosis present

## 2017-09-23 DIAGNOSIS — Z7982 Long term (current) use of aspirin: Secondary | ICD-10-CM | POA: Diagnosis not present

## 2017-09-23 DIAGNOSIS — Z794 Long term (current) use of insulin: Secondary | ICD-10-CM

## 2017-09-23 DIAGNOSIS — Z6833 Body mass index (BMI) 33.0-33.9, adult: Secondary | ICD-10-CM | POA: Diagnosis not present

## 2017-09-23 DIAGNOSIS — Z89512 Acquired absence of left leg below knee: Secondary | ICD-10-CM | POA: Diagnosis not present

## 2017-09-23 DIAGNOSIS — Z9111 Patient's noncompliance with dietary regimen: Secondary | ICD-10-CM | POA: Diagnosis not present

## 2017-09-23 DIAGNOSIS — K219 Gastro-esophageal reflux disease without esophagitis: Secondary | ICD-10-CM | POA: Diagnosis present

## 2017-09-23 DIAGNOSIS — I2510 Atherosclerotic heart disease of native coronary artery without angina pectoris: Secondary | ICD-10-CM

## 2017-09-23 DIAGNOSIS — E1142 Type 2 diabetes mellitus with diabetic polyneuropathy: Secondary | ICD-10-CM | POA: Diagnosis present

## 2017-09-23 DIAGNOSIS — E785 Hyperlipidemia, unspecified: Secondary | ICD-10-CM | POA: Diagnosis present

## 2017-09-23 DIAGNOSIS — I252 Old myocardial infarction: Secondary | ICD-10-CM | POA: Diagnosis not present

## 2017-09-23 DIAGNOSIS — G4733 Obstructive sleep apnea (adult) (pediatric): Secondary | ICD-10-CM | POA: Diagnosis present

## 2017-09-23 LAB — CBC
HCT: 39.4 % (ref 39.0–52.0)
HEMOGLOBIN: 13.1 g/dL (ref 13.0–17.0)
MCH: 30.5 pg (ref 26.0–34.0)
MCHC: 33.2 g/dL (ref 30.0–36.0)
MCV: 91.8 fL (ref 78.0–100.0)
PLATELETS: 194 10*3/uL (ref 150–400)
RBC: 4.29 MIL/uL (ref 4.22–5.81)
RDW: 13.6 % (ref 11.5–15.5)
WBC: 7.2 10*3/uL (ref 4.0–10.5)

## 2017-09-23 LAB — GLUCOSE, CAPILLARY
GLUCOSE-CAPILLARY: 179 mg/dL — AB (ref 65–99)
Glucose-Capillary: 143 mg/dL — ABNORMAL HIGH (ref 65–99)
Glucose-Capillary: 118 mg/dL — ABNORMAL HIGH (ref 65–99)
Glucose-Capillary: 130 mg/dL — ABNORMAL HIGH (ref 65–99)

## 2017-09-23 LAB — BASIC METABOLIC PANEL
Anion gap: 13 (ref 5–15)
BUN: 14 mg/dL (ref 6–20)
CHLORIDE: 107 mmol/L (ref 101–111)
CO2: 18 mmol/L — ABNORMAL LOW (ref 22–32)
CREATININE: 0.98 mg/dL (ref 0.61–1.24)
Calcium: 8.5 mg/dL — ABNORMAL LOW (ref 8.9–10.3)
Glucose, Bld: 148 mg/dL — ABNORMAL HIGH (ref 65–99)
POTASSIUM: 3.4 mmol/L — AB (ref 3.5–5.1)
Sodium: 138 mmol/L (ref 135–145)

## 2017-09-23 MED ORDER — METOCLOPRAMIDE HCL 5 MG/ML IJ SOLN
10.0000 mg | Freq: Three times a day (TID) | INTRAMUSCULAR | Status: DC
  Administered 2017-09-23 – 2017-09-24 (×4): 10 mg via INTRAVENOUS
  Filled 2017-09-23 (×4): qty 2

## 2017-09-23 MED ORDER — POTASSIUM CHLORIDE CRYS ER 20 MEQ PO TBCR
40.0000 meq | EXTENDED_RELEASE_TABLET | Freq: Once | ORAL | Status: AC
  Administered 2017-09-23: 40 meq via ORAL
  Filled 2017-09-23: qty 2

## 2017-09-23 MED ORDER — SODIUM CHLORIDE 0.9 % IV SOLN
INTRAVENOUS | Status: AC
  Administered 2017-09-23: 10:00:00 via INTRAVENOUS

## 2017-09-23 MED ORDER — PNEUMOCOCCAL VAC POLYVALENT 25 MCG/0.5ML IJ INJ
0.5000 mL | INJECTION | INTRAMUSCULAR | Status: DC
  Filled 2017-09-23: qty 0.5

## 2017-09-23 MED ORDER — TRAZODONE HCL 50 MG PO TABS
50.0000 mg | ORAL_TABLET | Freq: Once | ORAL | Status: AC
  Administered 2017-09-23: 50 mg via ORAL
  Filled 2017-09-23: qty 1

## 2017-09-23 NOTE — Progress Notes (Signed)
[facility] PROGRESS NOTE  Samuel [name] WJX:[id number] DOB: 12-Aug-1950 DOA: 09/21/2017  PCP: [name], MD  Brief History/Interval Summary: 67 year old male with a past medical history of insulin-dependent diabetes, coronary artery disease status post stenting, pulmonary embolism, status post left BKA, GERD, sleep apnea and gastroparesis who did not take his insulin for a few days presented with nausea and vomiting.  Found to have diabetic ketoacidosis.  He was hospitalized for further management.  Reason for Visit: Diabetic ketoacidosis.  Diabetic gastroparesis  Consultants: None  Procedures: None  Antibiotics: None  Subjective/Interval History: Patient states that he feels better this morning.  Has not had any vomiting since yesterday morning.  Continues to have some nausea.  Denies any abdominal pain.    ROS: Denies any chest pain or shortness of breath.  Objective:  Vital Signs  Vitals:   09/22/17 1900 09/22/17 2000 09/22/17 2323 09/23/17 0506  BP: (!) 127/51  (!) 153/59 (!) 112/49  Pulse: 76 70 76 77  Resp: 14 (!) 22 (!) 21 20  Temp:  97.7 F (36.5 C) 97.8 F (36.6 C) 98 F (36.7 C)  TempSrc:  Oral Oral Oral  SpO2: 96% 100% 100% 96%  Weight:   124.5 kg (274 lb 7.6 oz)   Height:   6\' 2"  (1.88 m)     Intake/Output Summary (Last 24 hours) at 09/23/2017 0948 Last data filed at 09/23/2017 0600 Gross per 24 hour  Intake 2613.09 ml  Output 300 ml  Net 2313.09 ml   Filed Weights   09/22/17 0534 09/22/17 2323  Weight: 122.3 kg (269 lb 10 oz) 124.5 kg (274 lb 7.6 oz)    General appearance: Awake alert.  In no distress Resp: Clear to auscultation bilaterally Cardio: S1-S2 is normal regular.  No S3-S4.  No rubs murmurs or bruit GI: Abdomen is soft.  Nontender nondistended.  Bowel sounds are present.  No masses organomegaly Extremities: Left BKA.  Chronic skin changes in the right leg with old wound which is healed. Neurologic: No focal neurological  deficits.  Lab Results:  Data Reviewed: I have personally reviewed following labs and imaging studies  CBC: Recent Labs  Lab 09/21/17 2200 09/22/17 0354 09/23/17 0455  WBC 8.9 9.0 7.2  HGB 15.7 14.0 13.1  HCT 44.8 40.6 39.4  MCV 89.6 90.2 91.8  PLT 211 204 194    Basic Metabolic Panel: Recent Labs  Lab 09/22/17 0354 09/22/17 0938 09/22/17 1256 09/22/17 1651 09/23/17 0455  NA 136 139 140 142 138  K 3.9 3.6 3.7 3.5 3.4*  CL 102 105 108 109 107  CO2 16* 18* 20* 21* 18*  GLUCOSE 284* 179* 159* 148* 148*  BUN 15 15 16 16 14   CREATININE 1.13 1.15 1.11 1.00 0.98  CALCIUM 8.5* 8.8* 8.8* 8.8* 8.5*  MG 1.7  --   --   --   --   PHOS 2.6  --   --   --   --     GFR: Estimated Creatinine Clearance: 106.7 mL/min (by C-G formula based on SCr of 0.98 mg/dL).  Liver Function Tests: Recent Labs  Lab 09/21/17 2200 09/22/17 0354  AST 25 18  ALT 23 20  ALKPHOS 99 80  BILITOT 1.6* 1.4*  PROT 7.7 6.7  ALBUMIN 3.7 3.1*    Cardiac Enzymes: Recent Labs  Lab 09/22/17 0354 09/22/17 0938 09/22/17 1651  TROPONINI <0.03  <0.03 0.03* 0.09*    HbA1C: Recent Labs    09/22/17 0354  HGBA1C 12.4*  CBG: Recent Labs  Lab 09/22/17 1857 09/22/17 2006 09/22/17 2113 09/22/17 2216 09/23/17 0732  GLUCAP 128* 138* 135* 189* 143*     Thyroid Function Tests: Recent Labs    09/22/17 0354  TSH 0.953    Recent Results (from the past 240 hour(s))  MRSA PCR Screening     Status: None   Collection Time: 09/22/17  3:14 AM  Result Value Ref Range Status   MRSA by PCR NEGATIVE NEGATIVE Final    Comment:        The GeneXpert MRSA Assay (FDA approved for NASAL specimens only), is one component of a comprehensive MRSA colonization surveillance program. It is not intended to diagnose MRSA infection nor to guide or monitor treatment for MRSA infections. Performed at [facility], [address] [place], [place] [address]       Radiology  Studies: Dg Abd 1 View  Result Date: 09/22/2017 CLINICAL DATA:  DKA type 2.  Small-bowel obstruction EXAM: ABDOMEN - 1 VIEW COMPARISON:  08/02/2017 FINDINGS: Normal bowel gas pattern. Negative for bowel obstruction. IVC filter at tooth L2-3 unchanged. Negative for urinary tract calculi. IMPRESSION: Normal bowel gas pattern Electronically Signed   By: Marlan Palau M.D.   On: 09/22/2017 08:08   Portable Chest X-ray (1 View)  Result Date: 09/22/2017 CLINICAL DATA:  DKA type 2 EXAM: PORTABLE CHEST 1 VIEW COMPARISON:  08/02/2017 FINDINGS: Mild bibasilar atelectasis. Negative for heart failure or pneumonia. No pleural effusion. IMPRESSION: Mild bibasilar atelectasis. Electronically Signed   By: Marlan Palau M.D.   On: 09/22/2017 08:09     Medications:  Scheduled: . apixaban  5 mg Oral BID  . aspirin EC  81 mg Oral Daily  . clopidogrel  75 mg Oral Daily  . gabapentin  300 mg Oral TID  . insulin aspart  0-15 Units Subcutaneous TID WC  . insulin aspart  0-5 Units Subcutaneous QHS  . insulin detemir  25 Units Subcutaneous Q24H  . metoCLOPramide (REGLAN) injection  10 mg Intravenous TID AC & HS  . metoprolol tartrate  12.5 mg Oral BID  . [START ON 09/24/2017] pneumococcal 23 valent vaccine  0.5 mL Intramuscular Tomorrow-1000  . potassium chloride  40 mEq Oral Once  . ranolazine  500 mg Oral BID   Continuous:  UVO:[id number] **OR** acetaminophen, albuterol, ondansetron (ZOFRAN) IV, promethazine, promethazine  Assessment/Plan:  Active Problems:   CAD S/P DES PCI to mLAD: Xience DES 2.75 x 15 (3.0 mm)   Essential hypertension   Hyperlipidemia due to type 2 diabetes mellitus (HCC)   Gastroparesis   Intractable nausea and vomiting   S/P BKA (below knee amputation) (HCC)   COPD (chronic obstructive pulmonary disease) (HCC)   DKA, type 2, not at goal [facility])    Diabetic ketoacidosis in the setting of insulin-dependent diabetes mellitus DKA was likely a result of noncompliance.  Patient  did not take his insulin for a few days while he was at a funeral.  Patient was started on DKA protocol with insulin infusion and IV fluids.  His anion gap closed.  He was transitioned to subcutaneous insulin.  HbA1c is 12.4.  Patient educated to be compliant with his insulin regimen in the future.  Home medication list also shows that he is on [place], [place], Januvia and Actos.  The Invokana and Jardiance can cause DKA as well.  These will be discontinued at discharge.  He will need to follow-up with his outpatient provider for further management of diabetes.  History of diabetic gastroparesis with intractable nausea and vomiting Symptoms have improved with the intravenous Reglan.  This will be continued for now.  Diet will be advanced to carb modified.  Out of bed to chair.  Abdominal films did not show any obstruction.    Hyperlipidemia Resume statin at discharge.  Essential hypertension Blood pressure is reasonably well controlled this morning.  Continue metoprolol.    History of coronary artery disease Continue home medications.  Stable.  No chest pain.  History of COPD Currently stable.  History of obstructive sleep apnea Does not use CPAP  Obesity Body mass index is 33.7 kg/m.  Will need weight loss counseling.  Remote history of pulmonary embolism Continue Eliquis.  Sounds like he has been noncompliant with this as well.  DVT Prophylaxis: Eliquis    Code Status: DNR Family Communication: Discussed with the patient Disposition Plan: Management as outlined above.  If he tolerates his diet then potentially discharge tomorrow.    LOS: 0 days   Osvaldo ShipperGokul Daryn Pisani  [facility] Pager [phone] 09/23/2017, 9:48 AM  If 7PM-7AM, please contact night-coverage at [email or web address], password [email or web address]

## 2017-09-23 NOTE — Care Management Obs Status (Signed)
MEDICARE OBSERVATION STATUS NOTIFICATION   Patient Details  Name: Samuel BusingJohn D Duthie MRN: [id number] Date of Birth: 04-Sep-1950   Medicare Observation Status Notification Given:  Yes    MahabirOlegario Messier, Anaria Kroner, RN 09/23/2017, 1:49 PM

## 2017-09-23 NOTE — Patient Outreach (Signed)
[facility] Care Management  09/23/2017  Beckie BusingJohn D Girgenti 1950/11/02 [id number]   RN Health [facility]  Referral Date:  08/27/2017 Referral Source:  Transfer from [facility] Community Nurse Reason for Referral:  Continued Disease Management Education Insurance:  Health Team Advantage   Outreach Attempt:  Notified per ADT system [facility].  Patient currently still hospitalized.  Upon chart review patient admitted for DKA.  Plan:  RN Health Coach will notify [facility] to check with patient and verify needs.  Rhae LernerFarrah Tanelle Lanzo RN [facility]  RN Health Coach [phone] [email or web address]

## 2017-09-23 NOTE — Consult Note (Signed)
   [facility] Consult   09/23/2017  Samuel BusingJohn D [name] 10-21-1950 [id number]    Made aware of [facility] by [facility]. Will follow up with [name] regarding [facility].  Made inpatient RNCM aware of above notes.  Raiford NobleAtika Hall, MSN-Ed, RN,BSN [facility] Care Management Hospital Liaison [phone]

## 2017-09-23 NOTE — Evaluation (Signed)
Physical Therapy Evaluation Patient Details Name: Samuel [name] MRN: [id number] DOB: Sep 06, 1950 Today's Date: 09/23/2017   History of Present Illness  67 year old male with a past medical history of insulin-dependent diabetes, coronary artery disease status post stenting, pulmonary embolism, status post left BKA, GERD, sleep apnea and gastroparesis who did not take his insulin for a few days presented with nausea and vomiting.  Found to have diabetic ketoacidosis.   Clinical Impression  Patient presents with decreased mobility due to weakness with illness and deconditioning from bedrest.  Feel he can benefit from skilled PT in the acute setting to allow return home with family support and follow up HHPT.      Follow Up Recommendations Home health PT(has used [name] in the past with [name], PT)    Equipment Recommendations  None recommended by PT    Recommendations for Other Services       Precautions / Restrictions Precautions Precautions: Fall Precaution Comments: L LE BKA, prosthetic at home      Mobility  Bed Mobility                  Transfers                    Ambulation/Gait                Stairs            Wheelchair Mobility    Modified Rankin (Stroke Patients Only)       Balance                                             Pertinent Vitals/Pain Pain Assessment: No/denies pain    Home Living Family/patient expects to be discharged to:: Private residence Living Arrangements: Alone Available Help at Discharge: Available PRN/intermittently;Family(his cousin is his caregiver and comes daily for several hours ) Type of Home: Apartment Home Access: Level entry     Home Layout: One level Home Equipment: Walker - 2 wheels;Wheelchair - power;Wheelchair - manual;Tub bench;Hand held shower head;Grab bars - tub/shower;Grab bars - toilet Additional Comments: prosthetic for L LE    Prior Function Level of  Independence: Independent with assistive device(s)         Comments: hasn't walked much due to L knee flexion contracture     Hand Dominance   Dominant Hand: Right    Extremity/Trunk Assessment                Communication   Communication: No difficulties  Cognition Arousal/Alertness: Awake/alert Behavior During Therapy: WFL for tasks assessed/performed Overall Cognitive Status: Within Functional Limits for tasks assessed                                        General Comments      Exercises Amputee Exercises Chair Push Up: Strengthening;Both;5 reps;Seated   Assessment/Plan    PT Assessment Patient needs continued PT services  PT Problem List Decreased strength;Decreased activity tolerance;Decreased knowledge of use of DME;Decreased balance;Decreased knowledge of precautions;Decreased range of motion       PT Treatment Interventions Patient/family education;Balance training;Functional mobility training;Wheelchair mobility training;Therapeutic exercise;DME instruction;Therapeutic activities    PT Goals (Current goals can be found in the Care Plan section)  Acute Rehab PT  Goals Patient Stated Goal: To return home to prior level of function PT Goal Formulation: With patient Time For Goal Achievement: 09/30/17 Potential to Achieve Goals: Good    Frequency     Barriers to discharge        Co-evaluation               AM-PAC PT "6 Clicks" Daily Activity  Outcome Measure Difficulty turning over in bed (including adjusting bedclothes, sheets and blankets)?: A Lot Difficulty moving from lying on back to sitting on the side of the bed? : A Lot Difficulty sitting down on and standing up from a chair with arms (e.g., wheelchair, bedside commode, etc,.)?: Unable Help needed moving to and from a bed to chair (including a wheelchair)?: A Little Help needed walking in hospital room?: Total Help needed climbing 3-5 steps with a railing? :  Total 6 Click Score: 10    End of Session   Activity Tolerance: Patient tolerated treatment well Patient left: with call bell/phone within reach;in chair Nurse Communication: Mobility status PT Visit Diagnosis: Other abnormalities of gait and mobility (R26.89);Muscle weakness (generalized) (M62.81);Difficulty in walking, not elsewhere classified (R26.2)    Time: [id number] PT Time Calculation (min) (ACUTE ONLY): 29 min   Charges:   PT Evaluation $PT Eval Moderate Complexity: 1 Mod PT Treatments $Therapeutic Activity: 8-22 mins   PT G CodesSheran Lawless:        [name], [place] CarolinaPT [phone] 09/23/2017   [name] 09/23/2017, 3:06 PM

## 2017-09-24 ENCOUNTER — Ambulatory Visit: Payer: Self-pay | Admitting: *Deleted

## 2017-09-24 ENCOUNTER — Other Ambulatory Visit: Payer: Self-pay | Admitting: *Deleted

## 2017-09-24 ENCOUNTER — Encounter: Payer: Self-pay | Admitting: *Deleted

## 2017-09-24 DIAGNOSIS — E1165 Type 2 diabetes mellitus with hyperglycemia: Secondary | ICD-10-CM

## 2017-09-24 LAB — BASIC METABOLIC PANEL
Anion gap: 14 (ref 5–15)
BUN: 16 mg/dL (ref 6–20)
CALCIUM: 8.5 mg/dL — AB (ref 8.9–10.3)
CHLORIDE: 107 mmol/L (ref 101–111)
CO2: 17 mmol/L — AB (ref 22–32)
Creatinine, Ser: 0.94 mg/dL (ref 0.61–1.24)
GFR calc non Af Amer: >60 mL/min (ref >60)
Glucose, Bld: 128 mg/dL — ABNORMAL HIGH (ref 65–99)
Potassium: 3.5 mmol/L (ref 3.5–5.1)
SODIUM: 138 mmol/L (ref 135–145)

## 2017-09-24 LAB — GLUCOSE, CAPILLARY: Glucose-Capillary: 115 mg/dL — ABNORMAL HIGH (ref 65–99)

## 2017-09-24 MED ORDER — APIXABAN 5 MG PO TABS: 5.0000 mg | ORAL_TABLET | Freq: Two times a day (BID) | ORAL | 0 refills | Status: DC

## 2017-09-24 MED ORDER — METOCLOPRAMIDE HCL 10 MG PO TABS: 10.0000 mg | ORAL_TABLET | Freq: Three times a day (TID) | ORAL | Status: DC

## 2017-09-24 MED ORDER — METOCLOPRAMIDE HCL 5 MG PO TABS: 10.0000 mg | ORAL_TABLET | Freq: Three times a day (TID) | ORAL | 0 refills | Status: DC

## 2017-09-24 MED ORDER — ONDANSETRON HCL 4 MG/2ML IJ SOLN: 4.0000 mg | Freq: Four times a day (QID) | INTRAMUSCULAR | Status: DC | PRN

## 2017-09-24 MED ORDER — INSULIN DETEMIR 100 UNIT/ML ~~LOC~~ SOLN: 25.0000 [IU] | Freq: Two times a day (BID) | SUBCUTANEOUS | 11 refills | Status: DC

## 2017-09-24 NOTE — Patient Outreach (Signed)
[facility]) Care Management  09/24/2017  Samuel BusingJohn D [name] [birth year] [id number]   RN Health [facility]  Referral Date:08/27/2017 Ref[facility] Reason for Referral:Continued Disease Management Education Insurance:Health Team Advantage   Outreach Attempt:  [facility] today and referral has been made for [facility] and [facility].  Plan:  RN Health Coach will not open disease management program for this patient.  Samuel LernerFarrah Nakia Remmers RN [facility] Care Management  RN Health Coach [phone] [name]

## 2017-09-24 NOTE — Consult Note (Signed)
   [facility] Inpatient Consult   09/24/2017  Samuel [name] 02-11-1951 [id number]    [facility] Care Management follow up.   Spoke with [name] at bedside to discuss [facility] RNCM follow up. He is agreeable and written consent obtained. [facility] Care Management folder provided.   [name] lives alone. Reports he has trouble affording his insulin. States he has to " make it stretch". Discussed [facility] referral for medication affordability. He is agreeable to this.  Confirmed Primary Care MD is Dr. Selena Batten (this PCP office is listed as doing transition of care call). Explained [facility] Care Management will not interfere or replace home health. Confirmed best contact number for [name] as [phone]. He listed his brother, Marquest Gunkel at [phone] on [facility] consent.   Will make referral to [facility] RNCM for DM management and medication and [facility] Pharmacist for medication affordability.   Made inpatient RNCM aware. Will update [facility] Health Coach as well.  [name] will discharge home today.   [name], MSN-Ed, RN,BSN [facility] Liaison [phone]

## 2017-09-24 NOTE — Discharge Instructions (Signed)
Diabetic Ketoacidosis °Diabetic ketoacidosis is a life-threatening complication of diabetes. If it is not treated, it can cause severe dehydration and organ damage and can lead to a coma or death. °What are the causes? °This condition develops when there is not enough of the hormone insulin in the body. Insulin helps the body to break down sugar for energy. Without insulin, the body cannot break down sugar, so it breaks down fats instead. This leads to the production of acids that are called ketones. Ketones are poisonous at high levels. °This condition can be triggered by: °· Stress on the body that is brought on by an illness. °· Medicines that raise blood glucose levels. °· Not taking diabetes medicine. ° °What are the signs or symptoms? °Symptoms of this condition include: °· Fatigue. °· Weight loss. °· Excessive thirst. °· Light-headedness. °· Fruity or sweet-smelling breath. °· Excessive urination. °· Vision changes. °· Confusion or irritability. °· Nausea. °· Vomiting. °· Rapid breathing. °· Abdominal pain. °· Feeling flushed. ° °How is this diagnosed? °This condition is diagnosed based on a medical history, a physical exam, and blood tests. You may also have a urine test that checks for ketones. °How is this treated? °This condition may be treated with: °· Fluid replacement. This may be done to correct dehydration. °· Insulin injections. These may be given through the skin or through an IV tube. °· Electrolyte replacement. Electrolytes, such as potassium and sodium, may be given in pill form or through an IV tube. °· Antibiotic medicines. These may be prescribed if your condition was caused by an infection. ° °Follow these instructions at home: °Eating and drinking °· Drink enough fluids to keep your urine clear or pale yellow. °· If you cannot eat, alternate between drinking fluids with sugar (such as juice) and salty fluids (such as broth or bouillon). °· If you can eat, follow your usual diet and drink  sugar-free liquids, such as water. °Other Instructions ° °· Take insulin as directed by your health care provider. Do not skip insulin injections. Do not use expired insulin. °· If your blood sugar is over 240 mg/dL, monitor your urine ketones every 4-6 hours. °· If you were prescribed an antibiotic medicine, finish all of it even if you start to feel better. °· Rest and exercise only as directed by your health care provider. °· If you get sick, call your health care provider and begin treatment quickly. Your body often needs extra insulin to fight an illness. °· Check your blood glucose levels regularly. If your blood glucose is high, drink plenty of fluids. This helps to flush out ketones. °Contact a health care provider if: °· Your blood glucose level is too high or too low. °· You have ketones in your urine. °· You have a fever. °· You cannot eat. °· You cannot tolerate fluids. °· You have been vomiting for more than 2 hours. °· You continue to have symptoms of this condition. °· You develop new symptoms. °Get help right away if: °· Your blood glucose levels continue to be high (elevated). °· Your monitor reads “high” even when you are taking insulin. °· You faint. °· You have chest pain. °· You have trouble breathing. °· You have a sudden, severe headache. °· You have sudden weakness in one arm or one leg. °· You have sudden trouble speaking or swallowing. °· You have vomiting or diarrhea that gets worse after 3 hours. °· You feel severely fatigued. °· You have trouble thinking. °· You   have abdominal pain. °· You are severely dehydrated. Symptoms of severe dehydration include: °? Extreme thirst. °? Dry mouth. °? Blue lips. °? Cold hands and feet. °? Rapid breathing. °This information is not intended to replace advice given to you by your health care provider. Make sure you discuss any questions you have with your health care provider. °Document Released: 06/01/2000 Document Revised: 11/10/2015 Document  Reviewed: 05/12/2014 °Elsevier Interactive Patient Education © 2017 Elsevier Inc. ° °

## 2017-09-24 NOTE — Progress Notes (Signed)
PT Cancellation Note  Patient Details Name: Samuel BusingJohn D [name] MRN: [id number] DOB: 07-Nov-1950   Cancelled Treatment:    Reason Eval/Treat Not Completed: Patient declined, no reason specified, Patient did not want to sit in recliner, stated sat too long yesterday. Willing to participate with OT.  Will continue PT as able.    Rada HayHill, [name] 09/24/2017, 8:51 AM  [name] PT [phone]

## 2017-09-24 NOTE — Discharge Summary (Signed)
[facility]  Physician Discharge Summary   Patient ID: Samuel [name] MRN: [id number] DOB/AGE: [birth year] 65 y.o.  Admit date: 09/21/2017 Discharge date: 09/24/2017  PCP: [name], MD  DISCHARGE DIAGNOSES:  Active Problems:   CAD S/P DES PCI to mLAD: Xience DES 2.75 x 15 (3.0 mm)   Essential hypertension   Hyperlipidemia due to type 2 diabetes mellitus (HCC)   Gastroparesis   Intractable nausea and vomiting   S/P BKA (below knee amputation) (HCC)   COPD (chronic obstructive pulmonary disease) (HCC)   DKA, type 2, not at goal [facility])   Uncontrolled diabetes mellitus (HCC)   RECOMMENDATIONS FOR OUTPATIENT FOLLOW UP: 1. Patient instructed to follow-up with his PCP within 1 week 2. Changes made to his diabetes medication regimen as discussed below.   DISCHARGE CONDITION: fair  Diet recommendation: Modified carbohydrate  Filed Weights   09/22/17 0534 09/22/17 2323 09/24/17 0415  Weight: 122.3 kg (269 lb 10 oz) 124.5 kg (274 lb 7.6 oz) 125.3 kg (276 lb 3.8 oz)    INITIAL HISTORY: 67 year old male with a past medical history of insulin-dependent diabetes, coronary artery disease status post stenting, pulmonary embolism, status post left BKA, GERD, sleep apnea and gastroparesis who did not take his insulin for a few days presented with nausea and vomiting.  Found to have diabetic ketoacidosis.  He was hospitalized for further management.   HOSPITAL COURSE:   Diabetic ketoacidosis in the setting of insulin-dependent diabetes mellitus DKA was likely a result of noncompliance.  Patient did not take his insulin for a few days while he was at a funeral.  Patient was started on DKA protocol with insulin infusion and IV fluids.  His anion gap closed.  He was transitioned to subcutaneous insulin.  HbA1c is 12.4.  Patient educated to be compliant with his insulin regimen in the future.  Home medication list also shows that he is on Jackson, Neihart, Januvia and Actos.  The  Invokana and Jardiance can cause DKA as well.  These will be discontinued at discharge.  CBGs have been very well controlled in the hospital just on 25 units of Levemir.  He does admit to dietary noncompliance at home could be the reason why he is on high doses of Levemir and on multiple medications.  We will discharge him on Levemir 25 minutes twice a day and patient has been instructed to check his blood glucose levels 3 times a day before meals and to cut down the dose of insulin if he has values less than 100 and to go up on the dose of balance of greater than 200.  His Januvia and Actos will also be discontinued for now.  He will need to follow-up with his primary care provider for further management.   History of diabetic gastroparesis with intractable nausea and vomiting Symptoms have improved with the intravenous Reglan.  Reglan will be changed over to oral.  Patient is tolerating diet.  Abdominal films did not show any obstruction.  Hyperlipidemia Resume statin at discharge.  Essential hypertension Continue home medications  History of coronary artery disease Stable.  Did not have any chest pain.  Elevated troponin likely due to demand ischemia.  EKG did not show any new changes.  Patient with known history of coronary artery disease.  Multivessel disease was noted on a catheter done in 2008.  He was referred to cardiothoracic surgeon who did not feel that patient was a good candidate for bypass surgery.  Continue to follow-up with cardiology.  History of COPD Able.  Continue home medications.  History of obstructive sleep apnea Does not use CPAP  Obesity Body mass index is 33.7 kg/m.    Remote history of pulmonary embolism Continue Eliquis.  Sounds like he has been noncompliant with this as well.  Overall stable.  Okay for discharge home today.   PERTINENT LABS:  The results of significant diagnostics from this hospitalization (including imaging, microbiology,  ancillary and laboratory) are listed below for reference.    Microbiology: Recent Results (from the past 240 hour(s))  MRSA PCR Screening     Status: None   Collection Time: 09/22/17  3:14 AM  Result Value Ref Range Status   MRSA by PCR NEGATIVE NEGATIVE Final    Comment:        The GeneXpert MRSA Assay (FDA approved for NASAL specimens only), is one component of a comprehensive MRSA colonization surveillance program. It is not intended to diagnose MRSA infection nor to guide or monitor treatment for MRSA infections. Performed at [facility], [address] [place], [place] [id number]      Labs: Basic Metabolic Panel: Recent Labs  Lab 09/22/17 0354 09/22/17 0938 09/22/17 1256 09/22/17 1651 09/23/17 0455 09/24/17 0532  NA 136 139 140 142 138 138  K 3.9 3.6 3.7 3.5 3.4* 3.5  CL 102 105 108 109 107 107  CO2 16* 18* 20* 21* 18* 17*  GLUCOSE 284* 179* 159* 148* 148* 128*  BUN 15 15 16 16 14 16   CREATININE 1.13 1.15 1.11 1.00 0.98 0.94  CALCIUM 8.5* 8.8* 8.8* 8.8* 8.5* 8.5*  MG 1.7  --   --   --   --   --   PHOS 2.6  --   --   --   --   --    Liver Function Tests: Recent Labs  Lab 09/21/17 2200 09/22/17 0354  AST 25 18  ALT 23 20  ALKPHOS 99 80  BILITOT 1.6* 1.4*  PROT 7.7 6.7  ALBUMIN 3.7 3.1*   CBC: Recent Labs  Lab 09/21/17 2200 09/22/17 0354 09/23/17 0455  WBC 8.9 9.0 7.2  HGB 15.7 14.0 13.1  HCT 44.8 40.6 39.4  MCV 89.6 90.2 91.8  PLT 211 204 194   Cardiac Enzymes: Recent Labs  Lab 09/22/17 0354 09/22/17 0938 09/22/17 1651  TROPONINI <0.03  <0.03 0.03* 0.09*   CBG: Recent Labs  Lab 09/23/17 0732 09/23/17 1206 09/23/17 1704 09/23/17 2248 09/24/17 0749  GLUCAP 143* 118* 179* 130* 115*     IMAGING STUDIES Dg Abd 1 View  Result Date: 09/22/2017 CLINICAL DATA:  DKA type 2.  Small-bowel obstruction EXAM: ABDOMEN - 1 VIEW COMPARISON:  08/02/2017 FINDINGS: Normal bowel gas pattern. Negative for bowel obstruction. IVC  filter at tooth L2-3 unchanged. Negative for urinary tract calculi. IMPRESSION: Normal bowel gas pattern Electronically Signed   By: Marlan Palauharles  Clark M.D.   On: 09/22/2017 08:08   Portable Chest X-ray (1 View)  Result Date: 09/22/2017 CLINICAL DATA:  DKA type 2 EXAM: PORTABLE CHEST 1 VIEW COMPARISON:  08/02/2017 FINDINGS: Mild bibasilar atelectasis. Negative for heart failure or pneumonia. No pleural effusion. IMPRESSION: Mild bibasilar atelectasis. Electronically Signed   By: Marlan Palauharles  Clark M.D.   On: 09/22/2017 08:09    DISCHARGE EXAMINATION: Vitals:   09/23/17 0506 09/23/17 1245 09/23/17 2124 09/24/17 0415  BP: (!) 112/49 (!) 155/73 119/60 (!) 118/48  Pulse: 77 74 78 71  Resp: 20 18    Temp: 98 F (36.7 C)  97.7 F (36.5 C) 97.7 F (36.5 C) 97.9 F (36.6 C)  TempSrc: Oral Oral Oral Oral  SpO2: 96% 95% 97% 95%  Weight:    125.3 kg (276 lb 3.8 oz)  Height:       General appearance: alert, cooperative, appears stated age and no distress Resp: clear to auscultation bilaterally Cardio: regular rate and rhythm, S1, S2 normal, no murmur, click, rub or gallop GI: soft, non-tender; bowel sounds normal; no masses,  no organomegaly  DISPOSITION: Home  Discharge Instructions    AMB Referral to [facility] Care Management   Complete by:  As directed    Please assign HTA member to [facility] for DM management. Risk score high of 23%. Please assign [facility] Pharmacist for medication affordability. Written consent obtained. Was followed by [facility] Coach prior to admit. PCP office [facility]) listed as doing toc. Discharge home today 09/24/17. Thanks. [name], MSN-Ed, RN,BSN-THN Care [facility] Liaison-[phone]   Reason for consult:  Please assign [facility] RNCM and [facility] Pharmacist   Diagnoses of:  Diabetes   Expected date of contact:  1-3 days (reserved for hospital discharges)   Call MD for:  difficulty breathing, headache or visual disturbances   Complete by:  As  directed    Call MD for:  extreme fatigue   Complete by:  As directed    Call MD for:  persistant dizziness or light-headedness   Complete by:  As directed    Call MD for:  persistant nausea and vomiting   Complete by:  As directed    Call MD for:  severe uncontrolled pain   Complete by:  As directed    Call MD for:  temperature >100.4   Complete by:  As directed    Diet Carb Modified   Complete by:  As directed    Discharge instructions   Complete by:  As directed    Please check your blood glucose levels 3 times a day before each meal.  Maintain a log for your primary care provider.  If blood glucose levels remain greater than 180 increase your insulin dose by 4 units.  If on the other hand glucose levels are less than 100 please cut back on insulin by 5 units.  Please be sure to follow-up with your primary care provider next week.  Please be compliant with the oral medications.  You were cared for by a hospitalist during your hospital stay. If you have any questions about your discharge medications or the care you received while you were in the hospital after you are discharged, you can call the unit and asked to speak with the hospitalist on call if the hospitalist that took care of you is not available. Once you are discharged, your primary care physician will handle any further medical issues. Please note that NO REFILLS for any discharge medications will be authorized once you are discharged, as it is imperative that you return to your primary care physician (or establish a relationship with a primary care physician if you do not have one) for your aftercare needs so that they can reassess your need for medications and monitor your lab values. If you do not have a primary care physician, you can call [phone] for a physician referral.   Increase activity slowly   Complete by:  As directed         Allergies as of 09/24/2017      Reactions   Gabapentin Hives, Itching, Rash  REACTION:  rash/hives (per patient, was a reaction to an inactive ingredient in another mgf brand). The patient stated that he does take this medication now and it doesn't cause a rash any more.   Nabumetone Itching, Nausea Only, Rash   Codeine Other (See Comments)   GI UPSET      Medication List    STOP taking these medications   INVOKANA 100 MG Tabs tablet Generic drug:  canagliflozin   JARDIANCE 25 MG Tabs tablet Generic drug:  empagliflozin   pioglitazone 30 MG tablet Commonly known as:  ACTOS   prochlorperazine 10 MG tablet Commonly known as:  COMPAZINE   sitaGLIPtin 100 MG tablet Commonly known as:  JANUVIA     TAKE these medications   acarbose 50 MG tablet Commonly known as:  PRECOSE Take 50 mg 3 (three) times daily with meals by mouth.   amLODipine 10 MG tablet Commonly known as:  NORVASC Take 1 tablet (10 mg total) by mouth daily.   apixaban 5 MG Tabs tablet Commonly known as:  ELIQUIS Take 1 tablet (5 mg total) by mouth 2 (two) times daily.   aspirin 81 MG EC tablet Take 1 tablet (81 mg total) by mouth daily.   atorvastatin 80 MG tablet Commonly known as:  LIPITOR Take 1 tablet (80 mg total) by mouth daily at 6 PM.   clopidogrel 75 MG tablet Commonly known as:  PLAVIX Take 1 tablet (75 mg total) by mouth daily.   COLACE 100 MG capsule Generic drug:  docusate sodium Take 100 mg by mouth daily as needed (for constipation).   diphenhydramine-acetaminophen 25-500 MG Tabs tablet Commonly known as:  TYLENOL PM Take 1 tablet by mouth at bedtime.   ferrous sulfate 325 (65 FE) MG tablet Take 325 mg by mouth every evening.   furosemide 20 MG tablet Commonly known as:  LASIX Take 1 tablet (20 mg total) by mouth daily. What changed:  when to take this   gabapentin 300 MG capsule Commonly known as:  NEURONTIN Take 300 mg by mouth 3 (three) times daily.   ibuprofen 200 MG tablet Commonly known as:  ADVIL,MOTRIN Take 400 mg every 6 (six) hours as needed by mouth  for headache or pain.   insulin detemir 100 UNIT/ML injection Commonly known as:  LEVEMIR Inject 0.25 mLs (25 Units total) into the skin 2 (two) times daily. What changed:  how much to take   insulin lispro 100 UNIT/ML injection Commonly known as:  HUMALOG Inject 0-10 Units into the skin 3 (three) times daily with meals. Inject SQ per sliding scale 150-250 (5 units), 251-350(8 units), 351-450(10 units), >450=10 units   lansoprazole 30 MG capsule Commonly known as:  PREVACID Take 30 mg by mouth 2 (two) times daily.   metoCLOPramide 5 MG tablet Commonly known as:  REGLAN Take 2 tablets (10 mg total) by mouth 4 (four) times daily -  before meals and at bedtime. What changed:  when to take this   metoprolol tartrate 25 MG tablet Commonly known as:  LOPRESSOR Take 1 tablet (25 mg total) by mouth 2 (two) times daily.   nitroGLYCERIN 0.4 MG SL tablet Commonly known as:  NITROSTAT Place 1 tablet (0.4 mg total) under the tongue every 5 (five) minutes as needed for chest pain.   polyethylene glycol packet Commonly known as:  MIRALAX / GLYCOLAX Take 17 g by mouth daily as needed for moderate constipation.   promethazine 25 MG tablet Commonly known as:  PHENERGAN Take 25  mg by mouth every 8 (eight) hours as needed for nausea or vomiting.   ranitidine 150 MG tablet Commonly known as:  ZANTAC Take 150 mg by mouth at bedtime.   ranolazine 500 MG 12 hr tablet Commonly known as:  RANEXA Take 1 tablet (500 mg total) by mouth 2 (two) times daily.   traMADol 50 MG tablet Commonly known as:  ULTRAM Take 100 mg by mouth every 12 (twelve) hours as needed (pain).        Follow-up Information    [name], MD. Schedule an appointment as soon as possible for a visit in 1 week(s).   Specialty:  Internal Medicine Contact information: [address] [place] [address] [place] [address] [phone]        Home, [facility] Follow up.   Specialty:  Home Health Services Why:  [facility]  physical therapy Contact information: [address] [place] [address] [place] [address] [phone]           TOTAL DISCHARGE TIME: 35 minutes  Osvaldo Shipper  [facility] Pager [phone]  09/24/2017, 2:06 PM

## 2017-09-24 NOTE — Care Management Note (Signed)
Case Management Note  Patient Details  Name: Samuel [name] MRN: [id number] Date of Birth: 11/15/1950  Subjective/Objective: PT recc HHPT-patient used [facility] in past will use again-rep Kathlene NovemberMike aware of d/c & HHPT order.                   Action/Plan:d/c home w/HHC.   Expected Discharge Date:  09/24/17               Expected Discharge Plan:  Home w Home Health Services  In-House Referral:     Discharge planning Services  CM Consult  Post Acute Care Choice:    Choice offered to:     DME Arranged:    DME Agency:     HH Arranged:  PT HH Agency:  [name] ([facility]  Status of Service:  Completed, signed off  If discussed at [employer] Meetings, dates discussed:    Additional Comments:  Lanier ClamMahabir, Sharnita Bogucki, RN 09/24/2017, 11:39 AM

## 2017-09-24 NOTE — Evaluation (Signed)
Occupational Therapy Evaluation Patient Details Name: Samuel [name] MRN: [id number] DOB: 1951/03/26 Today's Date: 09/24/2017    History of Present Illness 67 year old male with a past medical history of insulin-dependent diabetes, coronary artery disease status post stenting, pulmonary embolism, status post left BKA, GERD, sleep apnea and gastroparesis who did not take his insulin for a few days presented with nausea and vomiting.  Found to have diabetic ketoacidosis.    Clinical Impression   Pt was admitted for the above. He was agreeable to sitting EOB but not getting up.  He reports that he got into the chair yesterday and feels close to baseline.     Follow Up Recommendations  Supervision - Intermittent    Equipment Recommendations  None recommended by OT    Recommendations for Other Services       Precautions / Restrictions Precautions Precautions: Fall Precaution Comments: L LE BKA, prosthetic at home Restrictions Weight Bearing Restrictions: No      Mobility Bed Mobility Overal bed mobility: Modified Independent             General bed mobility comments: used bedrail  Transfers                 General transfer comment: pt did not want to get OOB. States he was up too long yesterday    Balance                                           ADL either performed or assessed with clinical judgement   ADL Overall ADL's : Needs assistance/impaired                                       General ADL Comments: pt is near baseline: set up. He is mod I at home. Cousin stays with him most days. They cook together. She can provide set up if he needs it, but he feels close to baseline     Vision         Perception     Praxis      Pertinent Vitals/Pain Pain Assessment: No/denies pain     Hand Dominance     Extremity/Trunk Assessment Upper Extremity Assessment Upper Extremity Assessment: Overall WFL for tasks assessed           Communication Communication Communication: No difficulties   Cognition Arousal/Alertness: Awake/alert Behavior During Therapy: WFL for tasks assessed/performed Overall Cognitive Status: Within Functional Limits for tasks assessed                                     General Comments       Exercises     Shoulder Instructions      Home Living Family/patient expects to be discharged to:: Private residence Living Arrangements: Alone Available Help at Discharge: Available PRN/intermittently;Family               Bathroom Shower/Tub: Tub/shower unit;Curtain   Bathroom Toilet: Handicapped height     Home Equipment: Environmental consultant - 2 wheels;Wheelchair - power;Wheelchair - manual;Tub bench;Hand held shower head;Grab bars - tub/shower;Grab bars - toilet   Additional Comments: prosthetic for L LE      Prior Functioning/Environment Level of Independence: Independent with assistive  device(s)        Comments: 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 puts leg on several times a week.          OT Problem List:        OT Treatment/Interventions:      OT Goals(Current goals can be found in the care plan section) Acute Rehab OT Goals Patient Stated Goal: To return home to prior level of function OT Goal Formulation: All assessment and education complete, DC therapy  OT Frequency:     Barriers to D/C:            Co-evaluation              AM-PAC PT "6 Clicks" Daily Activity     Outcome Measure Help from another person eating meals?: None Help from another person taking care of personal grooming?: A Little Help from another person toileting, which includes using toliet, bedpan, or urinal?: A Little Help from another person bathing (including washing, rinsing, drying)?: A Little Help from another person to put on and taking off regular upper body clothing?: A Little Help from another person to put on and taking off regular lower  body clothing?: A Little 6 Click Score: 19   End of Session    Activity Tolerance: Patient tolerated treatment well Patient left: in bed;with call bell/phone within reach;with bed alarm set  OT Visit Diagnosis: Muscle weakness (generalized) (M62.81)                Time: [id number] OT Time Calculation (min): 20 min Charges:  OT General Charges $OT Visit: 1 Visit OT Evaluation $OT Eval Low Complexity: 1 Low G-Codes:     AltonaMaryellen Samuel [name], OTR/L [phone] 09/24/2017  Samuel [name] 09/24/2017, 9:08 AM

## 2017-09-25 ENCOUNTER — Other Ambulatory Visit: Payer: Self-pay | Admitting: *Deleted

## 2017-09-25 ENCOUNTER — Other Ambulatory Visit: Payer: Self-pay | Admitting: Pharmacist

## 2017-09-25 ENCOUNTER — Encounter: Payer: Self-pay | Admitting: *Deleted

## 2017-09-25 DIAGNOSIS — K3184 Gastroparesis: Secondary | ICD-10-CM | POA: Diagnosis not present

## 2017-09-25 DIAGNOSIS — I2510 Atherosclerotic heart disease of native coronary artery without angina pectoris: Secondary | ICD-10-CM | POA: Diagnosis not present

## 2017-09-25 DIAGNOSIS — E1142 Type 2 diabetes mellitus with diabetic polyneuropathy: Secondary | ICD-10-CM | POA: Diagnosis not present

## 2017-09-25 DIAGNOSIS — E785 Hyperlipidemia, unspecified: Secondary | ICD-10-CM | POA: Diagnosis not present

## 2017-09-25 DIAGNOSIS — J449 Chronic obstructive pulmonary disease, unspecified: Secondary | ICD-10-CM | POA: Diagnosis not present

## 2017-09-25 DIAGNOSIS — E1165 Type 2 diabetes mellitus with hyperglycemia: Secondary | ICD-10-CM | POA: Diagnosis not present

## 2017-09-25 DIAGNOSIS — E11610 Type 2 diabetes mellitus with diabetic neuropathic arthropathy: Secondary | ICD-10-CM | POA: Diagnosis not present

## 2017-09-25 DIAGNOSIS — I252 Old myocardial infarction: Secondary | ICD-10-CM | POA: Diagnosis not present

## 2017-09-25 DIAGNOSIS — E1169 Type 2 diabetes mellitus with other specified complication: Secondary | ICD-10-CM | POA: Diagnosis not present

## 2017-09-25 DIAGNOSIS — E1143 Type 2 diabetes mellitus with diabetic autonomic (poly)neuropathy: Secondary | ICD-10-CM | POA: Diagnosis not present

## 2017-09-25 DIAGNOSIS — G4733 Obstructive sleep apnea (adult) (pediatric): Secondary | ICD-10-CM | POA: Diagnosis not present

## 2017-09-25 DIAGNOSIS — E1151 Type 2 diabetes mellitus with diabetic peripheral angiopathy without gangrene: Secondary | ICD-10-CM | POA: Diagnosis not present

## 2017-09-25 DIAGNOSIS — S80821D Blister (nonthermal), right lower leg, subsequent encounter: Secondary | ICD-10-CM | POA: Diagnosis not present

## 2017-09-25 NOTE — Patient Outreach (Signed)
[facility] Care Management  09/25/2017  Samuel BusingJohn D [name] December 25, 1950 [id number]    Telephone Assessment Referral received 4/9 recent hospitalization DKA without coma  RN spoke with pt today and [facility] and available services. Pt receptive and permitted RN to speak with his cousin Samuel [name] via speaker with pt present. RN inquired on pt's recent hospitalization related to his diabetes. Pt indicated he is aware that he needs to change and receptive to this change in working with this [name] nurse to accomplish this task. Note pt [facility] several occassions in the past. Pt reports his current A1C is 10.4 and his morning read initially was 442 retake at 11:00 AM 317 prior to eating. Pt is asymptomatic and aware of the symptoms for both hypo-hyperglycemia. RN discussed the risk involved if he does not monitor and control his readings. Discussed dietary habits and physical ability for increasing his mobility. 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  Pt reports he has lost some weight from 315 lbs to today 280 lbs. RN praise pt for his progress and offered to assist further with additional educational information. Offered community home visits for one-on-one to further engage. Pt receptive and willing to scheduled the initial home visit for next week. Further discussed the importance a healthier diet and food that may increase his blood sugar and to avoid such food items to reduce his overall BS. Plan of care, goals and interventions discussed to reduce his overall A1C over the next few months, daily CBG and consumption of healthier food items. Pt has agreed to this plan of care and grateful for the call today.  Note pharmacy also referred for medication management Samuel [name]([name] Boyd)  Patient was recently discharged from hospital  and all medications have been reviewed.  Elliot CousinLisa Maanav Kassabian, RN Care Management Coordinator [facility] Main Office [phone]

## 2017-09-26 NOTE — Patient Outreach (Signed)
[facility]) Care Management  [facility]   09/26/2017  Samuel [name] [birth year] [id number]  Subjective: Patient was called regarding medication assistance and medication reconciliation post discharge.  HIPAA identifiers were obtained.  Patient is a 67 year old male with multiple medical conditions including but not limited to: OSA, Pulmonary Embolism, type 2 diabetes, back pain, allergic rhinitis, COPD, hypertension, GERD, hyperlipidemia, CAD and was hospitalized 09/21/17-09/24/17 for DKA.    Patient's sister-in-law, Samuel [name] helps to manage his medications.     Objective:   Encounter Medications: Outpatient Encounter Medications as of 09/25/2017  Medication Sig Note  . acarbose (PRECOSE) 50 MG tablet Take 50 mg 3 (three) times daily with meals by mouth.   [name] amLODipine (NORVASC) 10 MG tablet Take 1 tablet (10 mg total) by mouth daily.   [name] apixaban (ELIQUIS) 5 MG TABS tablet Take 1 tablet (5 mg total) by mouth 2 (two) times daily.   [name] aspirin 81 MG EC tablet Take 1 tablet (81 mg total) by mouth daily.   [name] atorvastatin (LIPITOR) 80 MG tablet Take 1 tablet (80 mg total) by mouth daily at 6 PM.   . clopidogrel (PLAVIX) 75 MG tablet Take 1 tablet (75 mg total) by mouth daily.   [name] docusate sodium (COLACE) 100 MG capsule Take 100 mg by mouth daily as needed (for constipation).    . ferrous sulfate 325 (65 FE) MG tablet Take 325 mg by mouth every evening.    . furosemide (LASIX) 20 MG tablet Take 1 tablet (20 mg total) by mouth daily.   [name] gabapentin (NEURONTIN) 300 MG capsule Take 300 mg by mouth 3 (three) times daily. 10/06/2014: .  . ibuprofen (ADVIL,MOTRIN) 200 MG tablet Take 400 mg every 6 (six) hours as needed by mouth for headache or pain.   [name] insulin detemir (LEVEMIR) 100 UNIT/ML injection Inject 0.25 mLs (25 Units total) into the skin 2 (two) times daily.   . insulin lispro (HUMALOG) 100 UNIT/ML injection Inject 0-10 Units into the skin 3 (three) times daily with  meals. Inject SQ per sliding scale 150-250 (5 units), 251-350(8 units), 351-450(10 units), >450=10 units 09/21/2017: Pt used 6 units   . lansoprazole (PREVACID) 30 MG capsule Take 30 mg by mouth 2 (two) times daily.   . metoCLOPramide (REGLAN) 5 MG tablet Take 2 tablets (10 mg total) by mouth 4 (four) times daily -  before meals and at bedtime.   . metoprolol tartrate (LOPRESSOR) 25 MG tablet Take 1 tablet (25 mg total) by mouth 2 (two) times daily.   . nitroGLYCERIN (NITROSTAT) 0.4 MG SL tablet Place 1 tablet (0.4 mg total) under the tongue every 5 (five) minutes as needed for chest pain. 06/26/2017: Has not needed recently  . polyethylene glycol (MIRALAX / GLYCOLAX) packet Take 17 g by mouth daily as needed for moderate constipation.   . promethazine (PHENERGAN) 25 MG tablet Take 25 mg by mouth every 8 (eight) hours as needed for nausea or vomiting.    . ranitidine (ZANTAC) 150 MG tablet Take 150 mg by mouth at bedtime.    . ranolazine (RANEXA) 500 MG 12 hr tablet Take 1 tablet (500 mg total) by mouth 2 (two) times daily.   . traMADol (ULTRAM) 50 MG tablet Take 100 mg by mouth every 12 (twelve) hours as needed (pain).   [name] diphenhydramine-acetaminophen (TYLENOL PM) 25-500 MG TABS tablet Take 1 tablet by mouth at bedtime. 09/21/2017: Pt ran out    No facility-administered encounter medications  on file as of 09/25/2017.     Functional Status: In your present state of health, do you have any difficulty performing the following activities: 09/25/2017 09/22/2017  Hearing? N N  Vision? N N  Comment - -  Difficulty concentrating or making decisions? N N  Walking or climbing stairs? Y Y  Comment - -  Dressing or bathing? Y Y  Doing errands, shopping? N N  Comment - -  Quarry managerreparing Food and eating ? N -  Using the Toilet? N -  In the past six months, have you accidently leaked urine? N -  Do you have problems with loss of bowel control? N -  Managing your Medications? N -  Comment - -  Managing your  Finances? N -  Housekeeping or managing your Housekeeping? N -  Comment - -  Some recent data might be hidden    Fall/Depression Screening: Fall Risk  09/25/2017 08/27/2017 08/08/2017  Falls in the past year? No (No Data) (No Data)  Comment - Patient denies new/ recent falls Patient denies new/ recent falls  Number falls in past yr: - - -  Injury with Fall? - - -  Risk Factor Category  - - -  Risk for fall due to : - - -  Follow up - - -   PHQ 2/9 Scores 09/25/2017 06/14/2017 04/19/2017 04/12/2017 05/03/2014 03/12/2013  PHQ - 2 Score 0 0 0 0 0 0      Assessment: ASSESSMENT: Date Discharged from Hospital: 09/24/17 Date Medication Reconciliation Performed: 09/26/2017  Medications Discontinued at Discharge:   INVOKANA 100 MG Tabs tablet JARDIANCE 25 MG Tabs tablet pioglitazone 30 MG tablet (ACTOS) prochlorperazine 10 MG tablet (COMPAZINE) sitaGLIPtin 100 MG tablet (JANUVIA)**  Medications changed at Discharge:   insulin detemir (LEVEMIR) metoclopramide (REGLAN)  No new medications were prescribed at discharge.  Patient was recently discharged from hospital and all medications have been reviewed  Patient's medications were review with him via telephone.  Drugs sorted by system:  Neurologic/Psychologic: Gabapentin   Cardiovascular: Amlodipine Eliquis Aspirin Atorvastatin Clopidogrel Metoprolol Nitroglycerin Ranexa Furosemide   Gastrointestinal: Lansoprazole Metoclopramide Promethazine Ranitidine Polyethylene glycol Docusate sodium  Endocrine: Levemir Humalog Acarbose  Pain: Ibuprofen  Vitamins/Minerals: Ferrous sulfate  Medication Review Findings:  Drug-Drug Interactions-  metoclopramide/promethazine-combination may put patient at risk for extra pyramidal side effects  Eliquis/Clopidogrel/ASA- increased risk of bleeding  Promethazine/tramadol/gabapentin-increased risk of CNS depression     Medication Assistance Findings:  -Patient  was previously approved to receive Eliquis and had a 3 month supply delivered to his home in January.  -To become eligible again for the 2019 benefit year, the patient will need to spend at least 3% of his income in out of pocket medication expenses.  -HTA was called, the patient has spent $204 in out of pocket medication expenses and needs to spend about $500 to qualify.  -An application was done for the patient to help receive Ranxea last year but it was denied because the patient's copay was only $45  -Levemir and Humalog are available from Patient Assistance Companies but both companies require patients to spend at least $1000 in out of pocket medication expenses.  -An Extra Help application was completed on the patient's behalf in November of 2018.  He is unsure if he was approved or not.   -An email was sent to Amil AmenJulia (the pharmacy technician at [facility]) to inquire about the patient's LIS status. -Extra help application was completed on the patient's behalf  PLAN: -Route note to patient's PCP -Conduct a home dual home visit with [facility] Nurse, [name], RN 10/03/17 -Assess patient again for LIS/extra help  [name], PharmD, [facility] Clinical Pharmacist [phone]

## 2017-09-30 DIAGNOSIS — K3184 Gastroparesis: Secondary | ICD-10-CM | POA: Diagnosis not present

## 2017-09-30 DIAGNOSIS — E785 Hyperlipidemia, unspecified: Secondary | ICD-10-CM | POA: Diagnosis not present

## 2017-09-30 DIAGNOSIS — S80821D Blister (nonthermal), right lower leg, subsequent encounter: Secondary | ICD-10-CM | POA: Diagnosis not present

## 2017-09-30 DIAGNOSIS — E1151 Type 2 diabetes mellitus with diabetic peripheral angiopathy without gangrene: Secondary | ICD-10-CM | POA: Diagnosis not present

## 2017-09-30 DIAGNOSIS — G4733 Obstructive sleep apnea (adult) (pediatric): Secondary | ICD-10-CM | POA: Diagnosis not present

## 2017-09-30 DIAGNOSIS — E1165 Type 2 diabetes mellitus with hyperglycemia: Secondary | ICD-10-CM | POA: Diagnosis not present

## 2017-09-30 DIAGNOSIS — E1143 Type 2 diabetes mellitus with diabetic autonomic (poly)neuropathy: Secondary | ICD-10-CM | POA: Diagnosis not present

## 2017-09-30 DIAGNOSIS — E11610 Type 2 diabetes mellitus with diabetic neuropathic arthropathy: Secondary | ICD-10-CM | POA: Diagnosis not present

## 2017-09-30 DIAGNOSIS — E1169 Type 2 diabetes mellitus with other specified complication: Secondary | ICD-10-CM | POA: Diagnosis not present

## 2017-09-30 DIAGNOSIS — I2510 Atherosclerotic heart disease of native coronary artery without angina pectoris: Secondary | ICD-10-CM | POA: Diagnosis not present

## 2017-09-30 DIAGNOSIS — E1142 Type 2 diabetes mellitus with diabetic polyneuropathy: Secondary | ICD-10-CM | POA: Diagnosis not present

## 2017-09-30 DIAGNOSIS — I252 Old myocardial infarction: Secondary | ICD-10-CM | POA: Diagnosis not present

## 2017-09-30 DIAGNOSIS — J449 Chronic obstructive pulmonary disease, unspecified: Secondary | ICD-10-CM | POA: Diagnosis not present

## 2017-10-03 ENCOUNTER — Other Ambulatory Visit: Payer: Self-pay | Admitting: Pharmacist

## 2017-10-03 ENCOUNTER — Ambulatory Visit: Payer: Self-pay | Admitting: *Deleted

## 2017-10-03 DIAGNOSIS — S80821D Blister (nonthermal), right lower leg, subsequent encounter: Secondary | ICD-10-CM | POA: Diagnosis not present

## 2017-10-03 DIAGNOSIS — K3184 Gastroparesis: Secondary | ICD-10-CM | POA: Diagnosis not present

## 2017-10-03 DIAGNOSIS — E785 Hyperlipidemia, unspecified: Secondary | ICD-10-CM | POA: Diagnosis not present

## 2017-10-03 DIAGNOSIS — E1151 Type 2 diabetes mellitus with diabetic peripheral angiopathy without gangrene: Secondary | ICD-10-CM | POA: Diagnosis not present

## 2017-10-03 DIAGNOSIS — J449 Chronic obstructive pulmonary disease, unspecified: Secondary | ICD-10-CM | POA: Diagnosis not present

## 2017-10-03 DIAGNOSIS — G4733 Obstructive sleep apnea (adult) (pediatric): Secondary | ICD-10-CM | POA: Diagnosis not present

## 2017-10-03 DIAGNOSIS — I252 Old myocardial infarction: Secondary | ICD-10-CM | POA: Diagnosis not present

## 2017-10-03 DIAGNOSIS — E11610 Type 2 diabetes mellitus with diabetic neuropathic arthropathy: Secondary | ICD-10-CM | POA: Diagnosis not present

## 2017-10-03 DIAGNOSIS — E1142 Type 2 diabetes mellitus with diabetic polyneuropathy: Secondary | ICD-10-CM | POA: Diagnosis not present

## 2017-10-03 DIAGNOSIS — I2510 Atherosclerotic heart disease of native coronary artery without angina pectoris: Secondary | ICD-10-CM | POA: Diagnosis not present

## 2017-10-03 DIAGNOSIS — E1143 Type 2 diabetes mellitus with diabetic autonomic (poly)neuropathy: Secondary | ICD-10-CM | POA: Diagnosis not present

## 2017-10-03 DIAGNOSIS — E1165 Type 2 diabetes mellitus with hyperglycemia: Secondary | ICD-10-CM | POA: Diagnosis not present

## 2017-10-03 DIAGNOSIS — E1169 Type 2 diabetes mellitus with other specified complication: Secondary | ICD-10-CM | POA: Diagnosis not present

## 2017-10-03 NOTE — Patient Outreach (Signed)
[facility]) Care Management  [facility]   10/03/2017  Samuel [name] [birth year] [id number]  Subjective: Home visit completed at the patient's home. Patient's caretaker, [name] was present for the meeting as well as [facility] Pharmacist, Shanon Ace.  The patient was in bed for most of the visit suffering with nausea. HIPAA identifiers were obtained. Patient is a 67 year old male with multiple medical conditions including but not limited to:  Allergic rhinitis, COPD, type 2 diabetes, hypertension, gastroparesis, GERD, hyperlipidemia, obesity, pulmonary embolism, and below the knee amputation. Patient was hospitalized for DKA 09/21/17.  Patient's caregiver, [name] manages the patient's medications. He used [employer].   Objective:   Encounter Medications: Outpatient Encounter Medications as of 10/03/2017  Medication Sig Note  . acarbose (PRECOSE) 50 MG tablet Take 50 mg 3 (three) times daily with meals by mouth.   [name] amLODipine (NORVASC) 10 MG tablet Take 1 tablet (10 mg total) by mouth daily.   [name] apixaban (ELIQUIS) 5 MG TABS tablet Take 1 tablet (5 mg total) by mouth 2 (two) times daily.   [name] aspirin 81 MG EC tablet Take 1 tablet (81 mg total) by mouth daily.   [name] atorvastatin (LIPITOR) 80 MG tablet Take 1 tablet (80 mg total) by mouth daily at 6 PM.   . clopidogrel (PLAVIX) 75 MG tablet Take 1 tablet (75 mg total) by mouth daily.   [name] docusate sodium (COLACE) 100 MG capsule Take 100 mg by mouth daily as needed (for constipation).    . ferrous sulfate 325 (65 FE) MG tablet Take 325 mg by mouth every evening.    . furosemide (LASIX) 20 MG tablet Take 1 tablet (20 mg total) by mouth daily.   [name] gabapentin (NEURONTIN) 300 MG capsule Take 300 mg by mouth 3 (three) times daily. 10/03/2017: Label states four times daily   . ibuprofen (ADVIL,MOTRIN) 200 MG tablet Take 400 mg every 6 (six) hours as needed by mouth for headache or pain.   [name] insulin detemir (LEVEMIR) 100 UNIT/ML  injection Inject 0.25 mLs (25 Units total) into the skin 2 (two) times daily. (Patient taking differently: Inject 30 Units into the skin 2 (two) times daily. ) 10/03/2017: Patient reported 30 units twice daily  . insulin lispro (HUMALOG) 100 UNIT/ML injection Inject 0-10 Units into the skin 3 (three) times daily with meals. Inject SQ per sliding scale 150-250 (5 units), 251-350(8 units), 351-450(10 units), >450=10 units 09/21/2017: Pt used 6 units   . lansoprazole (PREVACID) 30 MG capsule Take 30 mg by mouth 2 (two) times daily.   . metoCLOPramide (REGLAN) 5 MG tablet Take 2 tablets (10 mg total) by mouth 4 (four) times daily -  before meals and at bedtime.   . metoprolol tartrate (LOPRESSOR) 25 MG tablet Take 1 tablet (25 mg total) by mouth 2 (two) times daily.   . nitroGLYCERIN (NITROSTAT) 0.4 MG SL tablet Place 1 tablet (0.4 mg total) under the tongue every 5 (five) minutes as needed for chest pain. 06/26/2017: Has not needed recently  . promethazine (PHENERGAN) 25 MG tablet Take 25 mg by mouth every 8 (eight) hours as needed for nausea or vomiting.    . promethazine (PHENERGAN) 25 MG/ML injection Inject 25 mg into the vein every 6 (six) hours as needed for nausea or vomiting. (1 ML every 6 hours prn)   . ranitidine (ZANTAC) 150 MG tablet Take 150 mg by mouth 2 (two) times daily.  10/03/2017: Discharge summary stated 1 tablet  daily  . ranolazine (RANEXA) 500 MG 12 hr tablet Take 1 tablet (500 mg total) by mouth 2 (two) times daily.   . traMADol (ULTRAM) 50 MG tablet Take 100 mg by mouth every 12 (twelve) hours as needed (pain).   [name] diphenhydramine-acetaminophen (TYLENOL PM) 25-500 MG TABS tablet Take 1 tablet by mouth at bedtime. 09/21/2017: Pt ran out   . ONETOUCH VERIO test strip Use to test blood sugar twice daily   . polyethylene glycol (MIRALAX / GLYCOLAX) packet Take 17 g by mouth daily as needed for moderate constipation. (Patient not taking: Reported on 10/03/2017) 10/03/2017: Has not needed recently    No facility-administered encounter medications on file as of 10/03/2017.     Functional Status: In your present state of health, do you have any difficulty performing the following activities: 09/25/2017 09/22/2017  Hearing? N N  Vision? N N  Comment - -  Difficulty concentrating or making decisions? N N  Walking or climbing stairs? Y Y  Comment - -  Dressing or bathing? Y Y  Doing errands, shopping? N N  Comment - -  Quarry manager and eating ? N -  Using the Toilet? N -  In the past six months, have you accidently leaked urine? N -  Do you have problems with loss of bowel control? N -  Managing your Medications? N -  Comment - -  Managing your Finances? N -  Housekeeping or managing your Housekeeping? N -  Comment - -  Some recent data might be hidden    Fall/Depression Screening: Fall Risk  09/25/2017 08/27/2017 08/08/2017  Falls in the past year? No (No Data) (No Data)  Comment - Patient denies new/ recent falls Patient denies new/ recent falls  Number falls in past yr: - - -  Injury with Fall? - - -  Risk Factor Category  - - -  Risk for fall due to : - - -  Follow up - - -   PHQ 2/9 Scores 09/25/2017 06/14/2017 04/19/2017 04/12/2017 05/03/2014 03/12/2013  PHQ - 2 Score 0 0 0 0 0 0      Assessment: Patient's medications were reviewed during the home visit.  Drugs sorted by system:  Neurologic/Psychologic: Gabapentin   Cardiovascular: Amlodipine Eliquis Aspirin Atorvastatin Clopidogrel Metoprolol Nitroglycerin Ranexa Furosemide   Gastrointestinal: Lansoprazole Metoclopramide Promethazine Ranitidine Polyethylene glycol Docusate sodium  Endocrine: Levemir Humalog Acarbose  Pain: Ibuprofen  Vitamins/Minerals: Ferrous sulfate  Medication Review Findings:  Drug-Drug Interactions-  metoclopramide/promethazine-combination may put patient at risk for extra pyramidal side effects (patient said he alternates the  dosing)  Eliquis/Clopidogrel/ASA- increased risk of bleeding  Promethazine/tramadol/gabapentin-increased risk of CNS depression   Therapeutic duplication:  High dose PPI (Lansoprazole BID) and Ranitidine Adherence:   Adherence: Patient's medications are on automatic refill.  As such, he had several bottles of amlodipine, metoprolol, ranitidine and lansoprazole in his possession.  [facility] was called and asked to remove lansoprazole and Ranitidine from automatic refill)  Patient had Toujeo samples in his possession instead of Levemir as was prescribed.  Clarification needed on Gabapentin dose.  Patient stated he is taking gabapentin PRN.  (It is unclear if the patient should be taking Gabapentin on a scheduled dose--three times daily as prescribed.)  Upon review of the patient's pill box:  Ranexa was only placed in the PM slot of the patient's pill box so he was only taking 1 tablet daily vs 1 tablet twice daily as prescribed. (Ranexa was added to the AM slot  of the patient's pill box as well).  Atorvastatin was in the AM and PM slots of the patient 's pill box. This was corrected--Atorvastatin was removed from the AM slots.  Lansoprazole was found in the PM slots only although instructions on the patient's medication list state 1 capsule twice daily.  It is unclear if the patient actually needs to take Lansoprazole twice daily especially with the link to kidney damage and bone loss with high dose PPI therapy.  It may be prudent to trial PRN PPI dosing.  Acarbose had not been placed in the pill box at all.  (The pill box was filled correctly on the patient's behalf.)  Interventions: Patient's pill box was filled for the next week Patient was provided with a personalized medication list by time with indication and directions using the [email or web address] website.  Medication Assistance: Signature was retrieved on the Eliquis patient assistance application.   Patient's income  information was also  scanned in preparation. He must spend 3% of his income on medication expenses to qualify for the Eliquis patient assistance program.  Patient was educated on how to keep tabs on his spending.  After reviewing the patient's most recent financial statements, he MAY qualify for Extra Help.   Plan:  Call patient back tomorrow to complete an Extra Help Application Follow up with patient and caregiver in 10 days to follow up on the pill box maintenance.

## 2017-10-08 DIAGNOSIS — G4733 Obstructive sleep apnea (adult) (pediatric): Secondary | ICD-10-CM | POA: Diagnosis not present

## 2017-10-08 DIAGNOSIS — E1169 Type 2 diabetes mellitus with other specified complication: Secondary | ICD-10-CM | POA: Diagnosis not present

## 2017-10-08 DIAGNOSIS — E11610 Type 2 diabetes mellitus with diabetic neuropathic arthropathy: Secondary | ICD-10-CM | POA: Diagnosis not present

## 2017-10-08 DIAGNOSIS — E1151 Type 2 diabetes mellitus with diabetic peripheral angiopathy without gangrene: Secondary | ICD-10-CM | POA: Diagnosis not present

## 2017-10-08 DIAGNOSIS — I2510 Atherosclerotic heart disease of native coronary artery without angina pectoris: Secondary | ICD-10-CM | POA: Diagnosis not present

## 2017-10-08 DIAGNOSIS — K3184 Gastroparesis: Secondary | ICD-10-CM | POA: Diagnosis not present

## 2017-10-08 DIAGNOSIS — I252 Old myocardial infarction: Secondary | ICD-10-CM | POA: Diagnosis not present

## 2017-10-08 DIAGNOSIS — E1142 Type 2 diabetes mellitus with diabetic polyneuropathy: Secondary | ICD-10-CM | POA: Diagnosis not present

## 2017-10-08 DIAGNOSIS — E1143 Type 2 diabetes mellitus with diabetic autonomic (poly)neuropathy: Secondary | ICD-10-CM | POA: Diagnosis not present

## 2017-10-08 DIAGNOSIS — E785 Hyperlipidemia, unspecified: Secondary | ICD-10-CM | POA: Diagnosis not present

## 2017-10-08 DIAGNOSIS — E1165 Type 2 diabetes mellitus with hyperglycemia: Secondary | ICD-10-CM | POA: Diagnosis not present

## 2017-10-08 DIAGNOSIS — J449 Chronic obstructive pulmonary disease, unspecified: Secondary | ICD-10-CM | POA: Diagnosis not present

## 2017-10-08 DIAGNOSIS — S80821D Blister (nonthermal), right lower leg, subsequent encounter: Secondary | ICD-10-CM | POA: Diagnosis not present

## 2017-10-09 ENCOUNTER — Other Ambulatory Visit: Payer: Self-pay | Admitting: *Deleted

## 2017-10-09 ENCOUNTER — Encounter: Payer: Self-pay | Admitting: *Deleted

## 2017-10-09 NOTE — Patient Outreach (Signed)
[facility]) Care Management   10/09/2017  Samuel [name] 09/28/1950 [id number]  Samuel [name] is an 67 y.o. male  Subjective:  Diabetes: Pt reports he has not been as compliant with monitoring his BS but based upon today's discussion pt willing to monitoring more and changes some of his habits to accomodates managing his diabetes better. Pt states he is aware of what can happen if he does not monitor his diabetes closer but willing to work with RN case manager to reducing his A1C over the next few months. Pt verbalized an understanding of today's educations . Pt has some knowledge base but request re-education on the topic diabetes for a much clearer understanding of the risk involved if this condition is not monitored.  [facility]: Pt confirms [facility] remains involved with PT/OT/RN/SW for community resources with [facility]. Pt also mentioned [facility] pharmacy [name]) has worked with him on all his medications and these issues have been resolved.   Objective:   Review of Systems  Constitutional: Negative.   HENT: Negative.   Eyes: Negative.   Respiratory: Negative.   Cardiovascular: Negative.   Gastrointestinal: Negative.   Genitourinary: Negative.   Musculoskeletal: Negative.   Skin: Negative.        Right leg with irritated area that are healing with some scabbed areas. Pt also has a bandage in place from incidence hitting the wheelchair.  Neurological: Negative.   Endo/Heme/Allergies: Negative.   Psychiatric/Behavioral: Negative.     Physical Exam  Constitutional: He is oriented to person, place, and time. He appears well-developed and well-nourished.  HENT:  Right Ear: External ear normal.  Left Ear: External ear normal.  Eyes: EOM are normal.  Neck: Normal range of motion.  Cardiovascular: Normal heart sounds.  Respiratory: Effort normal and breath sounds normal.  GI: Soft. Bowel sounds are normal.  Musculoskeletal: Normal range of motion.   Neurological: He is alert and oriented to person, place, and time.  Skin: Skin is warm and dry.  Bilateral areas on right lower leg with healing area from incidents where his legs has been hitting the wheelchair.  Psychiatric: He has a normal mood and affect. His behavior is normal. Judgment and thought content normal.    Encounter Medications:   Outpatient Encounter Medications as of 10/09/2017  Medication Sig Note  . acarbose (PRECOSE) 50 MG tablet Take 50 mg 3 (three) times daily with meals by mouth.   [name] amLODipine (NORVASC) 10 MG tablet Take 1 tablet (10 mg total) by mouth daily.   [name] apixaban (ELIQUIS) 5 MG TABS tablet Take 1 tablet (5 mg total) by mouth 2 (two) times daily.   [name] aspirin 81 MG EC tablet Take 1 tablet (81 mg total) by mouth daily.   [name] atorvastatin (LIPITOR) 80 MG tablet Take 1 tablet (80 mg total) by mouth daily at 6 PM.   . clopidogrel (PLAVIX) 75 MG tablet Take 1 tablet (75 mg total) by mouth daily.   . diphenhydramine-acetaminophen (TYLENOL PM) 25-500 MG TABS tablet Take 1 tablet by mouth at bedtime. 09/21/2017: Pt ran out   . docusate sodium (COLACE) 100 MG capsule Take 100 mg by mouth daily as needed (for constipation).    . ferrous sulfate 325 (65 FE) MG tablet Take 325 mg by mouth every evening.    . furosemide (LASIX) 20 MG tablet Take 1 tablet (20 mg total) by mouth daily.   [name] gabapentin (NEURONTIN) 300 MG capsule Take 300 mg by mouth 3 (  three) times daily. 10/03/2017: Label states four times daily   . ibuprofen (ADVIL,MOTRIN) 200 MG tablet Take 400 mg every 6 (six) hours as needed by mouth for headache or pain.   [name] insulin detemir (LEVEMIR) 100 UNIT/ML injection Inject 0.25 mLs (25 Units total) into the skin 2 (two) times daily. (Patient taking differently: Inject 30 Units into the skin 2 (two) times daily. ) 10/03/2017: Patient reported 30 units twice daily  . insulin lispro (HUMALOG) 100 UNIT/ML injection Inject 0-10 Units into the skin 3 (three) times daily with  meals. Inject SQ per sliding scale 150-250 (5 units), 251-350(8 units), 351-450(10 units), >450=10 units 09/21/2017: Pt used 6 units   . lansoprazole (PREVACID) 30 MG capsule Take 30 mg by mouth 2 (two) times daily.   . metoCLOPramide (REGLAN) 5 MG tablet Take 2 tablets (10 mg total) by mouth 4 (four) times daily -  before meals and at bedtime.   . metoprolol tartrate (LOPRESSOR) 25 MG tablet Take 1 tablet (25 mg total) by mouth 2 (two) times daily.   . nitroGLYCERIN (NITROSTAT) 0.4 MG SL tablet Place 1 tablet (0.4 mg total) under the tongue every 5 (five) minutes as needed for chest pain. 06/26/2017: Has not needed recently  . ONETOUCH VERIO test strip Use to test blood sugar twice daily   . polyethylene glycol (MIRALAX / GLYCOLAX) packet Take 17 g by mouth daily as needed for moderate constipation. 10/03/2017: Has not needed recently  . promethazine (PHENERGAN) 25 MG/ML injection Inject 25 mg into the vein every 6 (six) hours as needed for nausea or vomiting. (1 ML every 6 hours prn)   . ranitidine (ZANTAC) 150 MG tablet Take 150 mg by mouth 2 (two) times daily.  10/03/2017: Discharge summary stated 1 tablet daily  . ranolazine (RANEXA) 500 MG 12 hr tablet Take 1 tablet (500 mg total) by mouth 2 (two) times daily.   . traMADol (ULTRAM) 50 MG tablet Take 100 mg by mouth every 12 (twelve) hours as needed (pain).   . promethazine (PHENERGAN) 25 MG tablet Take 25 mg by mouth every 8 (eight) hours as needed for nausea or vomiting.     No facility-administered encounter medications on file as of 10/09/2017.     Functional Status:   In your present state of health, do you have any difficulty performing the following activities: 09/25/2017 09/22/2017  Hearing? N N  Vision? N N  Comment - -  Difficulty concentrating or making decisions? N N  Walking or climbing stairs? Y Y  Comment - -  Dressing or bathing? Y Y  Doing errands, shopping? N N  Comment - -  Quarry manager and eating ? N -  Using the Toilet?  N -  In the past six months, have you accidently leaked urine? N -  Do you have problems with loss of bowel control? N -  Managing your Medications? N -  Comment - -  Managing your Finances? N -  Housekeeping or managing your Housekeeping? N -  Comment - -  Some recent data might be hidden    Fall/Depression Screening:    Fall Risk  09/25/2017 08/27/2017 08/08/2017  Falls in the past year? No (No Data) (No Data)  Comment - Patient denies new/ recent falls Patient denies new/ recent falls  Number falls in past yr: - - -  Injury with Fall? - - -  Risk Factor Category  - - -  Risk for fall due to : - - -  Follow up - - -   PHQ 2/9 Scores 09/25/2017 06/14/2017 04/19/2017 04/12/2017 05/03/2014 03/12/2013  PHQ - 2 Score 0 0 0 0 0 0   BP 116/64 (BP Location: Left Arm, Patient Position: Sitting, Cuff Size: Normal)   Pulse 68   Resp 20   SpO2 98%  Assessment:   Ongoing case management related to Diabetes (A1C/ Diet)   Plan:  [facility]. Will reiterated on the plan of care, long and short term goals and interventions were adjusted accordingly. Will educate pt today on diabetes with provided material on the following (Diabetes: Nutrition and Healthy Eating, Diabetes Diet and Diabetic Ketoacidosis). Will further discuss pt's overall readings on his A1C and how importance it is to reduce this number and the risk involved if his diabetes is not monitored and managed. Will discussed dietary management with portion sizes related to healthy eating habits and foods to avoid. Verified this morning readings at 246 however meter does not provider readings for the 12/29/28 averages but documented readings indicated pt has remains under 200 on most days. Will discuss reducing his glucose readings to 150-175 over the next month with the goal to get under 150 (pt receptive with this goal). Plan of care adjusted once again to accomodates pt's plan of care and will be  re-evaluated on next month's home visit. Will also verified ongoing [facility] for RN/PT/OT/SW for community resources as needed. Will send today's home visit report to pt's primary provider.  Elliot CousinLisa Wessie Shanks, RN Care Management Coordinator [facility] Main Office [phone]

## 2017-10-10 ENCOUNTER — Other Ambulatory Visit: Payer: Self-pay | Admitting: Pharmacist

## 2017-10-10 ENCOUNTER — Ambulatory Visit: Payer: Self-pay | Admitting: Pharmacist

## 2017-10-10 DIAGNOSIS — I2510 Atherosclerotic heart disease of native coronary artery without angina pectoris: Secondary | ICD-10-CM | POA: Diagnosis not present

## 2017-10-10 DIAGNOSIS — Z794 Long term (current) use of insulin: Secondary | ICD-10-CM | POA: Diagnosis not present

## 2017-10-10 DIAGNOSIS — E1165 Type 2 diabetes mellitus with hyperglycemia: Secondary | ICD-10-CM | POA: Diagnosis not present

## 2017-10-10 DIAGNOSIS — E11610 Type 2 diabetes mellitus with diabetic neuropathic arthropathy: Secondary | ICD-10-CM | POA: Diagnosis not present

## 2017-10-10 DIAGNOSIS — G4733 Obstructive sleep apnea (adult) (pediatric): Secondary | ICD-10-CM | POA: Diagnosis not present

## 2017-10-10 DIAGNOSIS — E1169 Type 2 diabetes mellitus with other specified complication: Secondary | ICD-10-CM | POA: Diagnosis not present

## 2017-10-10 DIAGNOSIS — L0390 Cellulitis, unspecified: Secondary | ICD-10-CM | POA: Diagnosis not present

## 2017-10-10 DIAGNOSIS — J449 Chronic obstructive pulmonary disease, unspecified: Secondary | ICD-10-CM | POA: Diagnosis not present

## 2017-10-10 DIAGNOSIS — E1142 Type 2 diabetes mellitus with diabetic polyneuropathy: Secondary | ICD-10-CM | POA: Diagnosis not present

## 2017-10-10 DIAGNOSIS — I252 Old myocardial infarction: Secondary | ICD-10-CM | POA: Diagnosis not present

## 2017-10-10 DIAGNOSIS — E785 Hyperlipidemia, unspecified: Secondary | ICD-10-CM | POA: Diagnosis not present

## 2017-10-10 DIAGNOSIS — K3184 Gastroparesis: Secondary | ICD-10-CM | POA: Diagnosis not present

## 2017-10-10 DIAGNOSIS — E1143 Type 2 diabetes mellitus with diabetic autonomic (poly)neuropathy: Secondary | ICD-10-CM | POA: Diagnosis not present

## 2017-10-10 DIAGNOSIS — E1151 Type 2 diabetes mellitus with diabetic peripheral angiopathy without gangrene: Secondary | ICD-10-CM | POA: Diagnosis not present

## 2017-10-10 DIAGNOSIS — S80821D Blister (nonthermal), right lower leg, subsequent encounter: Secondary | ICD-10-CM | POA: Diagnosis not present

## 2017-10-10 DIAGNOSIS — E119 Type 2 diabetes mellitus without complications: Secondary | ICD-10-CM | POA: Diagnosis not present

## 2017-10-10 NOTE — Patient Outreach (Signed)
[facility]) Care Management  10/10/2017  Samuel BusingJohn D [name] 09-17-1950 [id number]   Called patient regarding medication assistance and adherence. Unfortunately, he did not answer the phone. HIPAA compliant message was left on the patient's voicemail.  Plan:  Call patient back in 3-4 business days.   Samuel McardleKatina J. Honest [name], PharmD, BCACP [facility] Clinical Pharmacist [phone]

## 2017-10-11 DIAGNOSIS — M86469 Chronic osteomyelitis with draining sinus, unspecified tibia and fibula: Secondary | ICD-10-CM | POA: Diagnosis not present

## 2017-10-11 DIAGNOSIS — Z4789 Encounter for other orthopedic aftercare: Secondary | ICD-10-CM | POA: Diagnosis not present

## 2017-10-11 DIAGNOSIS — G4733 Obstructive sleep apnea (adult) (pediatric): Secondary | ICD-10-CM | POA: Diagnosis not present

## 2017-10-11 DIAGNOSIS — L03119 Cellulitis of unspecified part of limb: Secondary | ICD-10-CM | POA: Diagnosis not present

## 2017-10-11 DIAGNOSIS — E139 Other specified diabetes mellitus without complications: Secondary | ICD-10-CM | POA: Diagnosis not present

## 2017-10-11 DIAGNOSIS — Z89512 Acquired absence of left leg below knee: Secondary | ICD-10-CM | POA: Diagnosis not present

## 2017-10-11 DIAGNOSIS — I259 Chronic ischemic heart disease, unspecified: Secondary | ICD-10-CM | POA: Diagnosis not present

## 2017-10-15 ENCOUNTER — Other Ambulatory Visit: Payer: Self-pay | Admitting: Pharmacist

## 2017-10-15 ENCOUNTER — Ambulatory Visit: Payer: Self-pay | Admitting: Pharmacist

## 2017-10-15 DIAGNOSIS — E1142 Type 2 diabetes mellitus with diabetic polyneuropathy: Secondary | ICD-10-CM | POA: Diagnosis not present

## 2017-10-15 DIAGNOSIS — I2510 Atherosclerotic heart disease of native coronary artery without angina pectoris: Secondary | ICD-10-CM | POA: Diagnosis not present

## 2017-10-15 DIAGNOSIS — E1151 Type 2 diabetes mellitus with diabetic peripheral angiopathy without gangrene: Secondary | ICD-10-CM | POA: Diagnosis not present

## 2017-10-15 DIAGNOSIS — E11610 Type 2 diabetes mellitus with diabetic neuropathic arthropathy: Secondary | ICD-10-CM | POA: Diagnosis not present

## 2017-10-15 DIAGNOSIS — S80821D Blister (nonthermal), right lower leg, subsequent encounter: Secondary | ICD-10-CM | POA: Diagnosis not present

## 2017-10-15 DIAGNOSIS — E1165 Type 2 diabetes mellitus with hyperglycemia: Secondary | ICD-10-CM | POA: Diagnosis not present

## 2017-10-15 DIAGNOSIS — E1143 Type 2 diabetes mellitus with diabetic autonomic (poly)neuropathy: Secondary | ICD-10-CM | POA: Diagnosis not present

## 2017-10-15 DIAGNOSIS — G4733 Obstructive sleep apnea (adult) (pediatric): Secondary | ICD-10-CM | POA: Diagnosis not present

## 2017-10-15 DIAGNOSIS — E1169 Type 2 diabetes mellitus with other specified complication: Secondary | ICD-10-CM | POA: Diagnosis not present

## 2017-10-15 DIAGNOSIS — K3184 Gastroparesis: Secondary | ICD-10-CM | POA: Diagnosis not present

## 2017-10-15 DIAGNOSIS — I252 Old myocardial infarction: Secondary | ICD-10-CM | POA: Diagnosis not present

## 2017-10-15 DIAGNOSIS — J449 Chronic obstructive pulmonary disease, unspecified: Secondary | ICD-10-CM | POA: Diagnosis not present

## 2017-10-15 DIAGNOSIS — E785 Hyperlipidemia, unspecified: Secondary | ICD-10-CM | POA: Diagnosis not present

## 2017-10-15 NOTE — Patient Outreach (Addendum)
[facility]) Care Management  10/15/2017  [name] 1950-07-08 [id number]   Called patient regarding medication assistance and adherence. Unfortunately, he did not answer the phone. HIPAA compliant message was left on the patient's voicemail.  Plan:  Call patient back in 3-4 business days. Unsuccessful Contact Letter Sent   [name], PharmD, [facility] Clinical Pharmacist [phone]  ADDENDUM  Patient's caregiver [name]) called me back. HIPAA identifiers were obtained. [name] reported she has been able to fill the patient's pill box with ease after being given a personalized medication list with administration times and indications.  Plan: Patient will be followed by Lilla Shook for patient assistance.  [name], PharmD, BCACP [facility] Clinical Pharmacist [phone]

## 2017-10-21 DIAGNOSIS — E11610 Type 2 diabetes mellitus with diabetic neuropathic arthropathy: Secondary | ICD-10-CM | POA: Diagnosis not present

## 2017-10-21 DIAGNOSIS — E1165 Type 2 diabetes mellitus with hyperglycemia: Secondary | ICD-10-CM | POA: Diagnosis not present

## 2017-10-21 DIAGNOSIS — J449 Chronic obstructive pulmonary disease, unspecified: Secondary | ICD-10-CM | POA: Diagnosis not present

## 2017-10-21 DIAGNOSIS — E1151 Type 2 diabetes mellitus with diabetic peripheral angiopathy without gangrene: Secondary | ICD-10-CM | POA: Diagnosis not present

## 2017-10-21 DIAGNOSIS — I252 Old myocardial infarction: Secondary | ICD-10-CM | POA: Diagnosis not present

## 2017-10-21 DIAGNOSIS — E1143 Type 2 diabetes mellitus with diabetic autonomic (poly)neuropathy: Secondary | ICD-10-CM | POA: Diagnosis not present

## 2017-10-21 DIAGNOSIS — S80821D Blister (nonthermal), right lower leg, subsequent encounter: Secondary | ICD-10-CM | POA: Diagnosis not present

## 2017-10-21 DIAGNOSIS — E1142 Type 2 diabetes mellitus with diabetic polyneuropathy: Secondary | ICD-10-CM | POA: Diagnosis not present

## 2017-10-21 DIAGNOSIS — G4733 Obstructive sleep apnea (adult) (pediatric): Secondary | ICD-10-CM | POA: Diagnosis not present

## 2017-10-21 DIAGNOSIS — E785 Hyperlipidemia, unspecified: Secondary | ICD-10-CM | POA: Diagnosis not present

## 2017-10-21 DIAGNOSIS — K3184 Gastroparesis: Secondary | ICD-10-CM | POA: Diagnosis not present

## 2017-10-21 DIAGNOSIS — E1169 Type 2 diabetes mellitus with other specified complication: Secondary | ICD-10-CM | POA: Diagnosis not present

## 2017-10-21 DIAGNOSIS — I2510 Atherosclerotic heart disease of native coronary artery without angina pectoris: Secondary | ICD-10-CM | POA: Diagnosis not present

## 2017-10-22 DIAGNOSIS — K3184 Gastroparesis: Secondary | ICD-10-CM | POA: Diagnosis not present

## 2017-10-22 DIAGNOSIS — I2510 Atherosclerotic heart disease of native coronary artery without angina pectoris: Secondary | ICD-10-CM | POA: Diagnosis not present

## 2017-10-22 DIAGNOSIS — E785 Hyperlipidemia, unspecified: Secondary | ICD-10-CM | POA: Diagnosis not present

## 2017-10-22 DIAGNOSIS — I252 Old myocardial infarction: Secondary | ICD-10-CM | POA: Diagnosis not present

## 2017-10-22 DIAGNOSIS — S80821D Blister (nonthermal), right lower leg, subsequent encounter: Secondary | ICD-10-CM | POA: Diagnosis not present

## 2017-10-22 DIAGNOSIS — E1151 Type 2 diabetes mellitus with diabetic peripheral angiopathy without gangrene: Secondary | ICD-10-CM | POA: Diagnosis not present

## 2017-10-22 DIAGNOSIS — E1143 Type 2 diabetes mellitus with diabetic autonomic (poly)neuropathy: Secondary | ICD-10-CM | POA: Diagnosis not present

## 2017-10-22 DIAGNOSIS — E1142 Type 2 diabetes mellitus with diabetic polyneuropathy: Secondary | ICD-10-CM | POA: Diagnosis not present

## 2017-10-22 DIAGNOSIS — G4733 Obstructive sleep apnea (adult) (pediatric): Secondary | ICD-10-CM | POA: Diagnosis not present

## 2017-10-22 DIAGNOSIS — E11610 Type 2 diabetes mellitus with diabetic neuropathic arthropathy: Secondary | ICD-10-CM | POA: Diagnosis not present

## 2017-10-22 DIAGNOSIS — J449 Chronic obstructive pulmonary disease, unspecified: Secondary | ICD-10-CM | POA: Diagnosis not present

## 2017-10-22 DIAGNOSIS — E1169 Type 2 diabetes mellitus with other specified complication: Secondary | ICD-10-CM | POA: Diagnosis not present

## 2017-10-22 DIAGNOSIS — E1165 Type 2 diabetes mellitus with hyperglycemia: Secondary | ICD-10-CM | POA: Diagnosis not present

## 2017-10-28 DIAGNOSIS — E1142 Type 2 diabetes mellitus with diabetic polyneuropathy: Secondary | ICD-10-CM | POA: Diagnosis not present

## 2017-10-28 DIAGNOSIS — I252 Old myocardial infarction: Secondary | ICD-10-CM | POA: Diagnosis not present

## 2017-10-28 DIAGNOSIS — E1165 Type 2 diabetes mellitus with hyperglycemia: Secondary | ICD-10-CM | POA: Diagnosis not present

## 2017-10-28 DIAGNOSIS — K3184 Gastroparesis: Secondary | ICD-10-CM | POA: Diagnosis not present

## 2017-10-28 DIAGNOSIS — E785 Hyperlipidemia, unspecified: Secondary | ICD-10-CM | POA: Diagnosis not present

## 2017-10-28 DIAGNOSIS — E1151 Type 2 diabetes mellitus with diabetic peripheral angiopathy without gangrene: Secondary | ICD-10-CM | POA: Diagnosis not present

## 2017-10-28 DIAGNOSIS — G4733 Obstructive sleep apnea (adult) (pediatric): Secondary | ICD-10-CM | POA: Diagnosis not present

## 2017-10-28 DIAGNOSIS — I2510 Atherosclerotic heart disease of native coronary artery without angina pectoris: Secondary | ICD-10-CM | POA: Diagnosis not present

## 2017-10-28 DIAGNOSIS — E1143 Type 2 diabetes mellitus with diabetic autonomic (poly)neuropathy: Secondary | ICD-10-CM | POA: Diagnosis not present

## 2017-10-28 DIAGNOSIS — E11610 Type 2 diabetes mellitus with diabetic neuropathic arthropathy: Secondary | ICD-10-CM | POA: Diagnosis not present

## 2017-10-28 DIAGNOSIS — E1169 Type 2 diabetes mellitus with other specified complication: Secondary | ICD-10-CM | POA: Diagnosis not present

## 2017-10-28 DIAGNOSIS — S80821D Blister (nonthermal), right lower leg, subsequent encounter: Secondary | ICD-10-CM | POA: Diagnosis not present

## 2017-10-28 DIAGNOSIS — J449 Chronic obstructive pulmonary disease, unspecified: Secondary | ICD-10-CM | POA: Diagnosis not present

## 2017-10-29 ENCOUNTER — Other Ambulatory Visit: Payer: Self-pay | Admitting: *Deleted

## 2017-10-29 NOTE — Patient Outreach (Signed)
[facility]) Care Management   10/29/2017  Samuel [name] Feb 15, 1951 [id number]  Samuel [name] is an 67 y.o. male  Subjective:  DM: Pt reports she continue to take his BS daily and documenting all readings. Pt explains the healthy habits cooking with [employer] and portion sizes. Pt discussed how well his doing and his progress over the last month.  REFERRAL: Pt receptive to a referral to [facility] services.   Objective:   Review of Systems  Constitutional: Negative.   HENT: Negative.   Eyes: Negative.   Respiratory: Negative.   Cardiovascular: Negative.   Gastrointestinal: Negative.   Genitourinary: Negative.   Musculoskeletal: Negative.   Skin: Negative.        Right LE with ongoing healing sites that have scabbed area with bandages placed. [facility] has bee discharged for ongoing wound care.   Neurological: Negative.   Endo/Heme/Allergies: Negative.   Psychiatric/Behavioral: Negative.     Physical Exam  Constitutional: He appears well-developed and well-nourished.  HENT:  Right Ear: External ear normal.  Left Ear: External ear normal.  Eyes: EOM are normal.  Neck: Normal range of motion.  Left leg BKA  Cardiovascular: Normal heart sounds.  Respiratory: Effort normal and breath sounds normal.  GI: Soft. Bowel sounds are normal.  Musculoskeletal: He exhibits edema.  RLE with noted edema 1+  Neurological: He is alert.  Skin: Skin is warm and dry.  Psychiatric: He has a normal mood and affect. His behavior is normal. Judgment and thought content normal.    Encounter Medications:   Outpatient Encounter Medications as of 10/29/2017  Medication Sig Note  . acarbose (PRECOSE) 50 MG tablet Take 50 mg 3 (three) times daily with meals by mouth.   [name] amLODipine (NORVASC) 10 MG tablet Take 1 tablet (10 mg total) by mouth daily.   [name] apixaban (ELIQUIS) 5 MG TABS tablet Take 1 tablet (5 mg total) by mouth 2 (two) times daily.   [name] aspirin 81 MG EC tablet Take 1  tablet (81 mg total) by mouth daily.   [name] atorvastatin (LIPITOR) 80 MG tablet Take 1 tablet (80 mg total) by mouth daily at 6 PM.   . clopidogrel (PLAVIX) 75 MG tablet Take 1 tablet (75 mg total) by mouth daily.   . diphenhydramine-acetaminophen (TYLENOL PM) 25-500 MG TABS tablet Take 1 tablet by mouth at bedtime. 09/21/2017: Pt ran out   . docusate sodium (COLACE) 100 MG capsule Take 100 mg by mouth daily as needed (for constipation).    . ferrous sulfate 325 (65 FE) MG tablet Take 325 mg by mouth every evening.    . furosemide (LASIX) 20 MG tablet Take 1 tablet (20 mg total) by mouth daily.   [name] gabapentin (NEURONTIN) 300 MG capsule Take 300 mg by mouth 3 (three) times daily. 10/03/2017: Label states four times daily   . ibuprofen (ADVIL,MOTRIN) 200 MG tablet Take 400 mg every 6 (six) hours as needed by mouth for headache or pain.   [name] insulin detemir (LEVEMIR) 100 UNIT/ML injection Inject 0.25 mLs (25 Units total) into the skin 2 (two) times daily. (Patient taking differently: Inject 30 Units into the skin 2 (two) times daily. ) 10/03/2017: Patient reported 30 units twice daily  . insulin lispro (HUMALOG) 100 UNIT/ML injection Inject 0-10 Units into the skin 3 (three) times daily with meals. Inject SQ per sliding scale 150-250 (5 units), 251-350(8 units), 351-450(10 units), >450=10 units 09/21/2017: Pt used 6 units   . lansoprazole (PREVACID) 30  MG capsule Take 30 mg by mouth 2 (two) times daily.   . metoCLOPramide (REGLAN) 5 MG tablet Take 2 tablets (10 mg total) by mouth 4 (four) times daily -  before meals and at bedtime.   . metoprolol tartrate (LOPRESSOR) 25 MG tablet Take 1 tablet (25 mg total) by mouth 2 (two) times daily.   . nitroGLYCERIN (NITROSTAT) 0.4 MG SL tablet Place 1 tablet (0.4 mg total) under the tongue every 5 (five) minutes as needed for chest pain. 06/26/2017: Has not needed recently  . ONETOUCH VERIO test strip Use to test blood sugar twice daily   . polyethylene glycol (MIRALAX /  GLYCOLAX) packet Take 17 g by mouth daily as needed for moderate constipation. 10/03/2017: Has not needed recently  . promethazine (PHENERGAN) 25 MG tablet Take 25 mg by mouth every 8 (eight) hours as needed for nausea or vomiting.    . ranitidine (ZANTAC) 150 MG tablet Take 150 mg by mouth 2 (two) times daily.  10/03/2017: Discharge summary stated 1 tablet daily  . ranolazine (RANEXA) 500 MG 12 hr tablet Take 1 tablet (500 mg total) by mouth 2 (two) times daily.   . traMADol (ULTRAM) 50 MG tablet Take 100 mg by mouth every 12 (twelve) hours as needed (pain).   . promethazine (PHENERGAN) 25 MG/ML injection Inject 25 mg into the vein every 6 (six) hours as needed for nausea or vomiting. (1 ML every 6 hours prn)    No facility-administered encounter medications on file as of 10/29/2017.     Functional Status:   In your present state of health, do you have any difficulty performing the following activities: 09/25/2017 09/22/2017  Hearing? N N  Vision? N N  Comment - -  Difficulty concentrating or making decisions? N N  Walking or climbing stairs? Y Y  Comment - -  Dressing or bathing? Y Y  Doing errands, shopping? N N  Comment - -  Quarry manager and eating ? N -  Using the Toilet? N -  In the past six months, have you accidently leaked urine? N -  Do you have problems with loss of bowel control? N -  Managing your Medications? N -  Comment - -  Managing your Finances? N -  Housekeeping or managing your Housekeeping? N -  Comment - -  Some recent data might be hidden    Fall/Depression Screening:    Fall Risk  09/25/2017 08/27/2017 08/08/2017  Falls in the past year? No (No Data) (No Data)  Comment - Patient denies new/ recent falls Patient denies new/ recent falls  Number falls in past yr: - - -  Injury with Fall? - - -  Risk Factor Category  - - -  Risk for fall due to : - - -  Follow up - - -   PHQ 2/9 Scores 09/25/2017 06/14/2017 04/19/2017 04/12/2017 05/03/2014 03/12/2013  PHQ - 2  Score 0 0 0 0 0 0   BP 118/62 (BP Location: Left Arm, Patient Position: Sitting, Cuff Size: Normal)   Pulse 76   Resp 20   SpO2 96%  Assessment:   Ongoing case management related Diabetes Referral for [facility] related to disease management Plan:  Will discuss pt's ongoing progress with management his diabetes related CBGs, daily weights and review plan of care related to A1C and how to reduce this readings with good habits and managing his diabetes through his daily habits and food choices. Will adjust his interventions related to  his discussed plan of care and goals extended to allow adherence. Will discussed possible referral for [facility] for ongoing services in managing his Diabetes. Pt very receptive and wishes to proceed with a referral. Will alert pt that his provider will be notified to proceed with this referral for services. Pt very excited about participate in the program to improve his management of care. Will alert [facility] of pt's interest in the program and request agency to proceed with the referral via primary provider's approval.  Will adjust of care with update at the time [facility] getting involved over the next few weeks. Will follow up with the agency and request the active date of services prior to closing pt out via [facility] services due to another case management services.  [name], RN Care Management Coordinator [facility] Main Office [phone]

## 2017-10-30 DIAGNOSIS — E118 Type 2 diabetes mellitus with unspecified complications: Secondary | ICD-10-CM | POA: Diagnosis not present

## 2017-10-30 DIAGNOSIS — L0390 Cellulitis, unspecified: Secondary | ICD-10-CM | POA: Diagnosis not present

## 2017-11-01 ENCOUNTER — Emergency Department (HOSPITAL_COMMUNITY): Payer: PPO

## 2017-11-01 ENCOUNTER — Ambulatory Visit: Payer: Self-pay | Admitting: Pharmacist

## 2017-11-01 ENCOUNTER — Encounter (HOSPITAL_COMMUNITY): Payer: Self-pay | Admitting: Internal Medicine

## 2017-11-01 ENCOUNTER — Other Ambulatory Visit: Payer: Self-pay | Admitting: Pharmacist

## 2017-11-01 ENCOUNTER — Emergency Department (HOSPITAL_COMMUNITY)
Admission: EM | Admit: 2017-11-01 | Discharge: 2017-11-01 | Disposition: A | Discharge status: Home / Self Care | Payer: PPO | Attending: Emergency Medicine | Admitting: Emergency Medicine

## 2017-11-01 DIAGNOSIS — Z794 Long term (current) use of insulin: Secondary | ICD-10-CM | POA: Insufficient documentation

## 2017-11-01 DIAGNOSIS — I2510 Atherosclerotic heart disease of native coronary artery without angina pectoris: Secondary | ICD-10-CM | POA: Insufficient documentation

## 2017-11-01 DIAGNOSIS — R0602 Shortness of breath: Secondary | ICD-10-CM | POA: Diagnosis not present

## 2017-11-01 DIAGNOSIS — Z7901 Long term (current) use of anticoagulants: Secondary | ICD-10-CM | POA: Insufficient documentation

## 2017-11-01 DIAGNOSIS — Z86711 Personal history of pulmonary embolism: Secondary | ICD-10-CM | POA: Insufficient documentation

## 2017-11-01 DIAGNOSIS — R202 Paresthesia of skin: Secondary | ICD-10-CM | POA: Insufficient documentation

## 2017-11-01 DIAGNOSIS — R110 Nausea: Secondary | ICD-10-CM | POA: Insufficient documentation

## 2017-11-01 DIAGNOSIS — I252 Old myocardial infarction: Secondary | ICD-10-CM | POA: Insufficient documentation

## 2017-11-01 DIAGNOSIS — D649 Anemia, unspecified: Secondary | ICD-10-CM | POA: Diagnosis not present

## 2017-11-01 DIAGNOSIS — I10 Essential (primary) hypertension: Secondary | ICD-10-CM | POA: Insufficient documentation

## 2017-11-01 DIAGNOSIS — R0600 Dyspnea, unspecified: Secondary | ICD-10-CM | POA: Diagnosis not present

## 2017-11-01 DIAGNOSIS — Z79899 Other long term (current) drug therapy: Secondary | ICD-10-CM | POA: Diagnosis not present

## 2017-11-01 DIAGNOSIS — E119 Type 2 diabetes mellitus without complications: Secondary | ICD-10-CM | POA: Insufficient documentation

## 2017-11-01 LAB — COMPREHENSIVE METABOLIC PANEL
ALT: 14 U/L — ABNORMAL LOW (ref 17–63)
ANION GAP: 11 (ref 5–15)
AST: 18 U/L (ref 15–41)
Albumin: 2.6 g/dL — ABNORMAL LOW (ref 3.5–5.0)
Alkaline Phosphatase: 64 U/L (ref 38–126)
BUN: 14 mg/dL (ref 6–20)
CALCIUM: 7.6 mg/dL — AB (ref 8.9–10.3)
CHLORIDE: 107 mmol/L (ref 101–111)
CO2: 23 mmol/L (ref 22–32)
Creatinine, Ser: 0.81 mg/dL (ref 0.61–1.24)
GFR calc Af Amer: >60 mL/min (ref >60)
GFR calc non Af Amer: >60 mL/min (ref >60)
GLUCOSE: 119 mg/dL — AB (ref 65–99)
POTASSIUM: 3.4 mmol/L — AB (ref 3.5–5.1)
Sodium: 141 mmol/L (ref 135–145)
Total Bilirubin: 0.8 mg/dL (ref 0.3–1.2)
Total Protein: 5.7 g/dL — ABNORMAL LOW (ref 6.5–8.1)

## 2017-11-01 LAB — BRAIN NATRIURETIC PEPTIDE: B NATRIURETIC PEPTIDE 5: 90.8 pg/mL (ref 0.0–100.0)

## 2017-11-01 LAB — CBC WITH DIFFERENTIAL/PLATELET
BASOS ABS: 0 10*3/uL (ref 0.0–0.1)
BASOS PCT: 0 %
EOS PCT: 2 %
Eosinophils Absolute: 0.1 10*3/uL (ref 0.0–0.7)
HCT: 30.1 % — ABNORMAL LOW (ref 39.0–52.0)
Hemoglobin: 10 g/dL — ABNORMAL LOW (ref 13.0–17.0)
Lymphocytes Relative: 19 %
Lymphs Abs: 1.1 10*3/uL (ref 0.7–4.0)
MCH: 30.7 pg (ref 26.0–34.0)
MCHC: 33.2 g/dL (ref 30.0–36.0)
MCV: 92.3 fL (ref 78.0–100.0)
Monocytes Absolute: 0.6 10*3/uL (ref 0.1–1.0)
Monocytes Relative: 11 %
Neutro Abs: 3.8 10*3/uL (ref 1.7–7.7)
Neutrophils Relative %: 68 %
PLATELETS: 182 10*3/uL (ref 150–400)
RBC: 3.26 MIL/uL — ABNORMAL LOW (ref 4.22–5.81)
RDW: 12.9 % (ref 11.5–15.5)
WBC: 5.6 10*3/uL (ref 4.0–10.5)

## 2017-11-01 LAB — URINALYSIS, ROUTINE W REFLEX MICROSCOPIC
Bilirubin Urine: NEGATIVE
Glucose, UA: 150 mg/dL — AB
Hgb urine dipstick: NEGATIVE
Ketones, ur: NEGATIVE mg/dL
LEUKOCYTES UA: NEGATIVE
NITRITE: NEGATIVE
PH: 7 (ref 5.0–8.0)
Protein, ur: NEGATIVE mg/dL
SPECIFIC GRAVITY, URINE: 1.014 (ref 1.005–1.030)

## 2017-11-01 LAB — LIPASE, BLOOD: Lipase: 17 U/L (ref 11–51)

## 2017-11-01 LAB — I-STAT TROPONIN, ED: TROPONIN I, POC: 0.01 ng/mL (ref 0.00–0.08)

## 2017-11-01 LAB — POC OCCULT BLOOD, ED: FECAL OCCULT BLD: NEGATIVE

## 2017-11-01 MED ORDER — TRAMADOL HCL 50 MG PO TABS
100.0000 mg | ORAL_TABLET | Freq: Once | ORAL | Status: AC
Start: 2017-11-01 — End: 2017-11-01
  Administered 2017-11-01: 100 mg via ORAL
  Filled 2017-11-01: qty 2

## 2017-11-01 MED ORDER — METOCLOPRAMIDE HCL 5 MG/ML IJ SOLN
10.0000 mg | Freq: Once | INTRAMUSCULAR | Status: AC
  Administered 2017-11-01: 10 mg via INTRAVENOUS
  Filled 2017-11-01: qty 2

## 2017-11-01 MED ORDER — GABAPENTIN 300 MG PO CAPS
300.0000 mg | ORAL_CAPSULE | Freq: Once | ORAL | Status: AC
  Administered 2017-11-01: 300 mg via ORAL
  Filled 2017-11-01: qty 1

## 2017-11-01 NOTE — Patient Outreach (Signed)
[facility]) Care Management  11/01/2017  Samuel [name] [birth year] [id number]  Reviewed patient's chart just prior to calling him and he presented to the emergency room today.  Plan: Call patient back on Monday to follow up.   [name], PharmD, BCACP [facility] Clinical Pharmacist [phone]

## 2017-11-01 NOTE — ED Provider Notes (Signed)
Medical screening examination/treatment/procedure(s) were conducted as a shared visit with non-physician practitioner(s) and myself.  I personally evaluated the patient during the encounter.  EKG Interpretation  Date/Time:  Friday Nov 01 2017 11:47:13 EDT Ventricular Rate:  80 PR Interval:    QRS Duration: 89 QT Interval:  415 QTC Calculation: 479 R Axis:   18 Text Interpretation:  Sinus rhythm Low voltage, precordial leads Borderline prolonged QT interval Confirmed by Lorre Nick ([id number]) on 11/01/2017 11:69:66 AM 67 year old male here with episodic shortness of breath with some paresthesias to his hands and feet.  EMS called and resolved after getting Zofran.  Work-up here does not show anything acute except for a drop in his hemoglobin.  He was guaiac negative from below.  Patient feels back to his baseline and is requesting to be discharged home   Lorre Nick, MD 11/01/17 1502

## 2017-11-01 NOTE — Discharge Instructions (Signed)
Please follow up with your doctor °Return if worsening °

## 2017-11-01 NOTE — ED Provider Notes (Signed)
[facility]-EMERGENCY DEPT Provider Note   CSN: [id number] Arrival date & time: 11/01/17  1002     History   Chief Complaint Chief Complaint  Patient presents with  . Shortness of Breath  . Nausea    HPI Samuel [name] is a 67 y.o. male who presents with shortness of breath, nausea, tingling.  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  He states that this morning he woke up" felt weird".  He had tingling of just the fingertips of his right hand and his right toes as well as tingling of the upper and lower lips on both sides of his face.  He called his cousin who lives 2 doors down who came and wanted to call EMS.  He states that he has some shortness of breath which is slightly worse than his baseline.  He also has had some swelling in his right leg which he has not had before.  He has a history of blood clots in his status post IVC filter and is on Eliquis.  He denies headache, vision changes, unilateral weakness, chest pain, cough, wheezing, abdominal pain, vomiting, diarrhea or urinary symptoms.  He states that his nausea feels different than his typical gastroparesis.  HPI  Past Medical History:  Diagnosis Date  . Anxiety   . Arthritis    "all over"   . CAD (coronary artery disease)   . Charcot's joint    "left foot"  . 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 Depression   . Gastroparesis   . GERD (gastroesophageal reflux disease)   . H/O hiatal hernia   . Hyperlipidemia   . Hypertension   . Myocardial infarction (HCC) 2017/03/27   around this date  . OSA (obstructive sleep apnea)    "not bad enough for a mask"  . Peripheral neuropathy   . Peripheral vascular disease (HCC)   . PONV (postoperative nausea and vomiting)   . Pulmonary embolism (HCC)    hx. of 2012  . Shortness of breath    exertion  . Type II diabetes mellitus [facility])     Patient Active Problem List   Diagnosis Date Noted  . Uncontrolled diabetes mellitus (HCC) 09/23/2017  . DKA, type 2, not at goal [facility]) 09/22/2017  . S/P BKA (below knee amputation) (HCC) 04/19/2017  . COPD (chronic obstructive pulmonary disease) (HCC) 04/19/2017  . NSTEMI (non-ST elevated myocardial infarction) (HCC) 04/07/2017  . Osteomyelitis of foot (HCC)   . Protein-calorie malnutrition, severe (HCC) 03/08/2015  . Osteomyelitis of foot, acute (HCC) 03/07/2015  . Sepsis (HCC) 03/07/2015  . Diabetes mellitus type 2, uncontrolled (HCC) 03/07/2015  . Normocytic normochromic anemia 03/07/2015  . Obesity (BMI 30-39.9) 03/07/2015  . Nausea & vomiting   . DM (diabetes mellitus), type 2 (HCC) 01/07/2014  . Chronic osteomyelitis involving lower leg (HCC) 01/07/2014  . Vomiting 12/22/2013  . Gastroparesis 12/21/2013  . Intractable nausea and vomiting 12/21/2013  . Essential hypertension 05/01/2013  . Hyperlipidemia due to type 2 diabetes mellitus (HCC) 05/01/2013  . Generalized weakness 03/20/2013  . Back pain 03/20/2013  . Pulmonary embolism (HCC) 03/27/2011  . PERFORATION OF GALLBLADDER 08/30/2010  . Ulcer of lower limb, unspecified 03/01/2010  . METHICILLIN SUSCEPTIBLE STAPH AUREUS SEPTICEMIA 01/25/2010  . Acute osteomyelitis, ankle and foot 01/25/2010  . NAUSEA AND VOMITING 01/25/2010  . GERD 11/09/2009  . CAD S/P DES PCI to  mLAD: Xience DES 2.75 x 15 (3.0 mm) 05/01/2009  . DIABETES, TYPE 1 05/14/2008  . OBSTRUCTIVE SLEEP APNEA 04/30/2008  . ALLERGIC RHINITIS, SEASONAL 04/30/2008    Past Surgical History:  Procedure Laterality Date  . AMPUTATION Left 03/08/2015   Procedure:  LEFT AMPUTATION BELOW KNEE;  Surgeon: Toni Arthurs, MD;  Location: [facility] ORS;  Service: Orthopedics;  Laterality: Left;  . APPLICATION OF WOUND VAC Left 01/07/2014  . CHOLECYSTECTOMY  06/2010  . CORONARY ANGIOPLASTY WITH STENT PLACEMENT  05/2009   "1"  . FOOT  SURGERY Left 2010   "for Charcot's joint"  . HARDWARE REMOVAL Left 01/07/2014   Procedure: LEFT LEG REMOVAL OF DEEP IMPLANT AND SEQUESTRECTOMY; APPLICATION OF WOUND VAC ;  Surgeon: Toni Arthurs, MD;  Location: [facility] OR;  Service: Orthopedics;  Laterality: Left;  . I&D EXTREMITY Left "multiple"   leg  . 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 IM NAILING TIBIA Left ~ 2012  . LEFT HEART CATH AND CORONARY ANGIOGRAPHY N/A 04/08/2017   Procedure: LEFT HEART CATH AND CORONARY ANGIOGRAPHY;  Surgeon: Runell Gess, MD;  Location: [facility] INVASIVE CV LAB;  Service: Cardiovascular;  Laterality: N/A;  . SHOULDER ARTHROSCOPY W/ ROTATOR CUFF REPAIR Left    "and bone spurs"  . TIBIAL IM ROD REMOVAL Left 01/07/2014  . TOE AMPUTATION Right ~ 2011   "great toe"  . VENA CAVA FILTER PLACEMENT  2012  . VENA CAVA FILTER PLACEMENT N/A 11/20/2016   Procedure: INSERTION VENA-CAVA FILTER;  Surgeon: Sherren Kerns, MD;  Location: [facility] OR;  Service: Vascular;  Laterality: N/A;  . WOUND DEBRIDEMENT Left 01/07/2014   "tibia"        Home Medications    Prior to Admission medications   Medication Sig Start Date End Date Taking? Authorizing Provider  acarbose (PRECOSE) 50 MG tablet Take 50 mg 3 (three) times daily with meals by mouth.    [name], MD  amLODipine (NORVASC) 10 MG tablet Take 1 tablet (10 mg total) by mouth daily. 11/22/16   Richarda Overlie, MD  apixaban (ELIQUIS) 5 MG TABS tablet Take 1 tablet (5 mg total) by mouth 2 (two) times daily. 09/24/17   Osvaldo Shipper, MD  aspirin 81 MG EC tablet Take 1 tablet (81 mg total) by mouth daily. 04/12/17   Glade Lloyd, MD  atorvastatin (LIPITOR) 80 MG tablet Take 1 tablet (80 mg total) by mouth daily at 6 PM. 04/11/17   Glade Lloyd, MD  clopidogrel (PLAVIX) 75 MG tablet Take 1 tablet (75 mg total) by mouth daily. 04/12/17    Glade Lloyd, MD  diphenhydramine-acetaminophen (TYLENOL PM) 25-500 MG TABS tablet Take 1 tablet by mouth at bedtime.    [provider]  docusate sodium (COLACE) 100 MG capsule Take 100 mg by mouth daily as needed (for constipation).     [provider]  ferrous sulfate 325 (65 FE) MG tablet Take 325 mg by mouth every evening.     [provider]  furosemide (LASIX) 20 MG tablet Take 1 tablet (20 mg total) by mouth daily. 04/12/17   Glade Lloyd, MD  gabapentin (NEURONTIN) 300 MG capsule Take 300 mg by mouth 3 (three) times daily.    [provider]  ibuprofen (ADVIL,MOTRIN) 200 MG tablet Take 400 mg every 6 (six) hours as needed by mouth for headache or pain.  [provider]  insulin detemir (LEVEMIR) 100 UNIT/ML injection Inject 0.25 mLs (25 Units total) into the skin 2 (two) times daily. Patient taking differently: Inject 30 Units into the skin 2 (two) times daily.  09/24/17   Osvaldo Shipper, MD  insulin lispro (HUMALOG) 100 UNIT/ML injection Inject 0-10 Units into the skin 3 (three) times daily with meals. Inject SQ per sliding scale 150-250 (5 units), 251-350(8 units), 351-450(10 units), >450=10 units    [provider]  lansoprazole (PREVACID) 30 MG capsule Take 30 mg by mouth 2 (two) times daily. 03/19/17   [provider]  metoCLOPramide (REGLAN) 5 MG tablet Take 2 tablets (10 mg total) by mouth 4 (four) times daily -  before meals and at bedtime. 09/24/17   Osvaldo Shipper, MD  metoprolol tartrate (LOPRESSOR) 25 MG tablet Take 1 tablet (25 mg total) by mouth 2 (two) times daily. 04/11/17   Glade Lloyd, MD  nitroGLYCERIN (NITROSTAT) 0.4 MG SL tablet Place 1 tablet (0.4 mg total) under the tongue every 5 (five) minutes as needed for chest pain. 04/11/17   Glade Lloyd, MD  [facility] VERIO test strip Use to test blood sugar twice daily 09/24/17   [provider]  polyethylene glycol (MIRALAX / GLYCOLAX) packet Take 17  g by mouth daily as needed for moderate constipation. 04/11/17   Glade Lloyd, MD  promethazine (PHENERGAN) 25 MG tablet Take 25 mg by mouth every 8 (eight) hours as needed for nausea or vomiting.     [provider]  promethazine (PHENERGAN) 25 MG/ML injection Inject 25 mg into the vein every 6 (six) hours as needed for nausea or vomiting. (1 ML every 6 hours prn)    [provider]  ranitidine (ZANTAC) 150 MG tablet Take 150 mg by mouth 2 (two) times daily.     [provider]  ranolazine (RANEXA) 500 MG 12 hr tablet Take 1 tablet (500 mg total) by mouth 2 (two) times daily. 04/11/17   Glade Lloyd, MD  traMADol (ULTRAM) 50 MG tablet Take 100 mg by mouth every 12 (twelve) hours as needed (pain).    [provider]    Family History Family History  Problem Relation Age of Onset  . COPD Mother   . Heart disease Mother   . Lung cancer Mother   . Retinal detachment Father   . Alcoholism Brother   . Heart disease Brother   . COPD Brother   . Diabetes Brother   . Hypertension Brother   . Stroke Brother     Social History Social History   Tobacco Use  . Smoking status: Never Smoker  . Smokeless tobacco: Never Used  Substance Use Topics  . Alcohol use: No    Frequency: Never    Comment: 01/07/2014 'might have a wine cooler a couple times/yr"  . Drug use: No     Allergies   Gabapentin; Nabumetone; and Codeine   Review of Systems Review of Systems  Constitutional: Negative for fever.  Respiratory: Positive for shortness of breath. Negative for cough and wheezing.   Cardiovascular: Positive for leg swelling. Negative for chest pain.  Gastrointestinal: Positive for nausea. Negative for abdominal pain, diarrhea and vomiting.  Genitourinary: Negative for dysuria.  Skin: Positive for wound.  All other systems reviewed and are negative.    Physical Exam Updated Vital Signs BP (!) 145/63   Pulse 81   Temp 97.7 F (36.5 C)   Resp (!)  21   SpO2 100%  Physical Exam  Constitutional: He is oriented to person, place, and time. He appears well-developed and well-nourished. No distress.  Chronically ill-appearing, obese  HENT:  Head: Normocephalic and atraumatic.  Eyes: Pupils are equal, round, and reactive to light. Conjunctivae are normal. Right eye exhibits no discharge. Left eye exhibits no discharge. No scleral icterus.  Neck: Normal range of motion.  Cardiovascular: Normal rate and regular rhythm.  Pulmonary/Chest: Effort normal. Tachypnea (Mildly) noted. No respiratory distress. He has decreased breath sounds.  Abdominal: Soft. Bowel sounds are normal. He exhibits no distension. There is no tenderness.  Musculoskeletal:  2+ pitting edema of the right lower extremity with multiple healing wounds.  He is status post amputation of the great toe.  2+ DP pulse  Neurological: He is alert and oriented to person, place, and time.  Lying on stretcher in NAD. GCS 15. Speaks in a clear voice. Cranial nerves II through XII grossly intact. 5/5 strength in all extremities. Sensation fully intact.  Bilateral finger-to-nose intact.    Skin: Skin is warm and dry.  Psychiatric: He has a normal mood and affect. His behavior is normal.  Nursing note and vitals reviewed.    ED Treatments / Results  Labs (all labs ordered are listed, but only abnormal results are displayed) Labs Reviewed  CBC WITH DIFFERENTIAL/PLATELET - Abnormal; Notable for the following components:      Result Value   RBC 3.26 (*)    Hemoglobin 10.0 (*)    HCT 30.1 (*)    All other components within normal limits  COMPREHENSIVE METABOLIC PANEL - Abnormal; Notable for the following components:   Potassium 3.4 (*)    Glucose, Bld 119 (*)    Calcium 7.6 (*)    Total Protein 5.7 (*)    Albumin 2.6 (*)    ALT 14 (*)    All other components within normal limits  URINALYSIS, ROUTINE W REFLEX MICROSCOPIC - Abnormal; Notable for the following components:    Glucose, UA 150 (*)    All other components within normal limits  LIPASE, BLOOD  BRAIN NATRIURETIC PEPTIDE  I-STAT TROPONIN, ED  POC OCCULT BLOOD, ED    EKG EKG Interpretation  Date/Time:  Friday Nov 01 2017 11:47:13 EDT Ventricular Rate:  80 PR Interval:    QRS Duration: 89 QT Interval:  415 QTC Calculation: 479 R Axis:   18 Text Interpretation:  Sinus rhythm Low voltage, precordial leads Borderline prolonged QT interval Confirmed by Lorre Nick ([id number]) on 11/01/2017 11:51:59 AM   Radiology Dg Chest 2 View  Result Date: 11/01/2017 CLINICAL DATA:  Shortness of breath EXAM: CHEST - 2 VIEW COMPARISON:  September 22, 2017 FINDINGS: There is no edema or consolidation. The heart size is upper normal with pulmonary vascularity within normal limits. No adenopathy. No bone lesions. IMPRESSION: No edema or consolidation. Electronically Signed   By: Bretta Bang III M.D.   On: 11/01/2017 11:44    Procedures Procedures (including critical care time)  Medications Ordered in ED Medications  metoCLOPramide (REGLAN) injection 10 mg (10 mg Intravenous Given 11/01/17 1213)  gabapentin (NEURONTIN) capsule 300 mg (300 mg Oral Given 11/01/17 1212)  traMADol (ULTRAM) tablet 100 mg (100 mg Oral Given 11/01/17 1213)     Initial Impression / Assessment and Plan / ED Course  I have reviewed the triage vital signs and the nursing notes.  Pertinent labs & imaging results that were available during my care of the patient were reviewed by me and considered in my medical  decision making (see chart for details).  67 year old male presents with shortness of breath, paresthesias for several hours.  Feels like this is different from his baseline shortness of breath and paresthesias.  He is mildly hypertensive tachypneic but otherwise vital signs are normal.  His lung sounds are clear.  His paresthesias cross the midline and are atypical for stroke symptoms.  Will obtain labs, chest x-ray, EKG, urine and  reassess.  CBC is remarkable for a significant drop in his hemoglobin.  Hemoglobin was 13.1 a month ago and it is 10 today.  CMP is remarkable for mild hypokalemia.  EKG is sinus rhythm.  Troponin is normal.  BNP is normal.  Chest x-ray is clear.  Urine is remarkable for 150 glucose.  Hemoccult was negative.  Discussed with the patient.  He is feeling better and would like to go home. Shared visit with Dr. Freida Busman.  Advised close follow-up with his primary doctor regarding his drop in hemoglobin.  Final Clinical Impressions(s) / ED Diagnoses   Final diagnoses:  Dyspnea, unspecified type  Paresthesia  Anemia, unspecified type    ED Discharge Orders    None       Bethel Born, PA-C 11/01/17 1541

## 2017-11-01 NOTE — ED Notes (Signed)
Patient transported to X-ray 

## 2017-11-01 NOTE — ED Triage Notes (Addendum)
Patient here from home c/o shortness of breath and nausea that started this morning upon awakening. Denies vomiting and diarrhea. Pt has hx of diabetes with diabetic neuropathy. Hx of BKA of left leg. Ulcers and sores noted to right ankle. Pt given  IV zofran by EMS. A/O x4.

## 2017-11-01 NOTE — ED Notes (Signed)
1 UNSUCCESSFUL LAB COLLECTION ATTEMPT 

## 2017-11-01 NOTE — ED Notes (Signed)
Bed: ZO10 Expected date:  Expected time:  Means of arrival:  Comments: EMS-nausea/tremors

## 2017-11-04 ENCOUNTER — Other Ambulatory Visit: Payer: Self-pay | Admitting: Pharmacist

## 2017-11-04 ENCOUNTER — Ambulatory Visit: Payer: Self-pay | Admitting: Pharmacist

## 2017-11-04 DIAGNOSIS — S80821D Blister (nonthermal), right lower leg, subsequent encounter: Secondary | ICD-10-CM | POA: Diagnosis not present

## 2017-11-04 DIAGNOSIS — E1165 Type 2 diabetes mellitus with hyperglycemia: Secondary | ICD-10-CM | POA: Diagnosis not present

## 2017-11-04 DIAGNOSIS — E1142 Type 2 diabetes mellitus with diabetic polyneuropathy: Secondary | ICD-10-CM | POA: Diagnosis not present

## 2017-11-04 DIAGNOSIS — K3184 Gastroparesis: Secondary | ICD-10-CM | POA: Diagnosis not present

## 2017-11-04 DIAGNOSIS — E785 Hyperlipidemia, unspecified: Secondary | ICD-10-CM | POA: Diagnosis not present

## 2017-11-04 DIAGNOSIS — E1143 Type 2 diabetes mellitus with diabetic autonomic (poly)neuropathy: Secondary | ICD-10-CM | POA: Diagnosis not present

## 2017-11-04 DIAGNOSIS — E1169 Type 2 diabetes mellitus with other specified complication: Secondary | ICD-10-CM | POA: Diagnosis not present

## 2017-11-04 DIAGNOSIS — E11610 Type 2 diabetes mellitus with diabetic neuropathic arthropathy: Secondary | ICD-10-CM | POA: Diagnosis not present

## 2017-11-04 DIAGNOSIS — E1151 Type 2 diabetes mellitus with diabetic peripheral angiopathy without gangrene: Secondary | ICD-10-CM | POA: Diagnosis not present

## 2017-11-04 DIAGNOSIS — J449 Chronic obstructive pulmonary disease, unspecified: Secondary | ICD-10-CM | POA: Diagnosis not present

## 2017-11-04 DIAGNOSIS — I2510 Atherosclerotic heart disease of native coronary artery without angina pectoris: Secondary | ICD-10-CM | POA: Diagnosis not present

## 2017-11-04 DIAGNOSIS — G4733 Obstructive sleep apnea (adult) (pediatric): Secondary | ICD-10-CM | POA: Diagnosis not present

## 2017-11-04 DIAGNOSIS — I252 Old myocardial infarction: Secondary | ICD-10-CM | POA: Diagnosis not present

## 2017-11-04 NOTE — Patient Outreach (Signed)
[facility]) Care Management  11/04/2017  Samuel [name] December 23, 1950 [id number]   Patient was called regarding medication management, adherence and assistance. Unfortunately, he did not answer the phone. In addition, his phone rang more than 30 times and did not provide an option to leave a message.   Plan: Call patient back in 3-4 business days. Send patient an unsuccessful contact letter.   [name], PharmD, BCACP [facility] Clinical Pharmacist [phone]

## 2017-11-07 ENCOUNTER — Other Ambulatory Visit: Payer: Self-pay | Admitting: Pharmacist

## 2017-11-07 ENCOUNTER — Ambulatory Visit: Payer: Self-pay | Admitting: Pharmacist

## 2017-11-07 NOTE — Patient Outreach (Signed)
[facility]) Care Management  11/07/2017  Samuel [name] 08-Apr-1951 [id number]   Patient was called to follow up on medication assistance and post discharge medication review.  Unfortunately, patient did not answer the phone.  HIPAA compliant message was left on the patient's voicemail.  Plan: Call patient back in 3-5 business days.   [name], PharmD, BCACP [facility] Clinical Pharmacist [phone]

## 2017-11-11 DIAGNOSIS — E1151 Type 2 diabetes mellitus with diabetic peripheral angiopathy without gangrene: Secondary | ICD-10-CM | POA: Diagnosis not present

## 2017-11-11 DIAGNOSIS — J449 Chronic obstructive pulmonary disease, unspecified: Secondary | ICD-10-CM | POA: Diagnosis not present

## 2017-11-11 DIAGNOSIS — G4733 Obstructive sleep apnea (adult) (pediatric): Secondary | ICD-10-CM | POA: Diagnosis not present

## 2017-11-11 DIAGNOSIS — E785 Hyperlipidemia, unspecified: Secondary | ICD-10-CM | POA: Diagnosis not present

## 2017-11-11 DIAGNOSIS — S80821D Blister (nonthermal), right lower leg, subsequent encounter: Secondary | ICD-10-CM | POA: Diagnosis not present

## 2017-11-11 DIAGNOSIS — E1142 Type 2 diabetes mellitus with diabetic polyneuropathy: Secondary | ICD-10-CM | POA: Diagnosis not present

## 2017-11-11 DIAGNOSIS — K3184 Gastroparesis: Secondary | ICD-10-CM | POA: Diagnosis not present

## 2017-11-11 DIAGNOSIS — E1165 Type 2 diabetes mellitus with hyperglycemia: Secondary | ICD-10-CM | POA: Diagnosis not present

## 2017-11-11 DIAGNOSIS — E1143 Type 2 diabetes mellitus with diabetic autonomic (poly)neuropathy: Secondary | ICD-10-CM | POA: Diagnosis not present

## 2017-11-11 DIAGNOSIS — I2510 Atherosclerotic heart disease of native coronary artery without angina pectoris: Secondary | ICD-10-CM | POA: Diagnosis not present

## 2017-11-11 DIAGNOSIS — I252 Old myocardial infarction: Secondary | ICD-10-CM | POA: Diagnosis not present

## 2017-11-11 DIAGNOSIS — E1169 Type 2 diabetes mellitus with other specified complication: Secondary | ICD-10-CM | POA: Diagnosis not present

## 2017-11-11 DIAGNOSIS — E11610 Type 2 diabetes mellitus with diabetic neuropathic arthropathy: Secondary | ICD-10-CM | POA: Diagnosis not present

## 2017-11-13 ENCOUNTER — Other Ambulatory Visit: Payer: Self-pay | Admitting: Pharmacy Technician

## 2017-11-13 ENCOUNTER — Other Ambulatory Visit: Payer: Self-pay | Admitting: Pharmacist

## 2017-11-13 NOTE — Patient Outreach (Addendum)
[facility]) Care Management  11/13/2017  Samuel [name] Oct 18, 1950 [id number]   Had previously received completed patient portion of [facility] [employer] application from [facility] RPh [name] for [employer]. At that time we were waiting for the patient to get closer to reaching the [facility] spending requirement for application.  Faxing provider portion to Dr. Allyson Sabal (Cardiologist) and also waiting for Amil Amen with [employer] to send patient OOP spend report.  Will submit completed application once provider portion is returned and printout is provided.  Suzan Slick [name] [facility] Care Management [phone]

## 2017-11-13 NOTE — Patient Outreach (Signed)
[facility]) Care Management  11/13/2017  Samuel [name] 23-Apr-1951 [id number]   Called patient to complete a medication review and to discuss medication assistance with Eliquis. HIPAA identifiers were obtained. Patient said his head was hurting today and he could not do a med review over the phone. In addition, his caregiver, [name] was also not there because she had a headache as well.   Spoke to [employer] about the patient. We are still waiting on the patient to spend the 3% of his income. Morrie Sheldon requested a [name] for the patient from [employer].  Plan: Call patient back in 3-5 business days.   [name], PharmD, BCACP [facility] Clinical Pharmacist [phone]

## 2017-11-18 ENCOUNTER — Telehealth: Payer: Self-pay | Admitting: Cardiovascular Disease

## 2017-11-18 NOTE — Telephone Encounter (Signed)
[facility] calling   Wanting to know if the application for patient assistance for Eliquis had been received. It was faxed on 5.29.19. Please advise

## 2017-11-18 NOTE — Telephone Encounter (Signed)
Spoke with Morrie Sheldonashley, aware form is waiting for dr berry to sign.

## 2017-11-20 ENCOUNTER — Other Ambulatory Visit: Payer: Self-pay | Admitting: Pharmacy Technician

## 2017-11-20 NOTE — Patient Outreach (Signed)
[facility]) Care Management  11/20/2017  Beckie BusingJohn D Seib 08-May-1951 [id number]   Received provider portion of [name] application, faxed completed application and required documents to company 06/05.  Will follow up with company in the next 7-10 business days  [employer]ley N. Ernesta Ambleoleman, CPhT [facility] Care Management [phone]

## 2017-11-21 ENCOUNTER — Other Ambulatory Visit: Payer: Self-pay | Admitting: *Deleted

## 2017-11-21 ENCOUNTER — Encounter: Payer: Self-pay | Admitting: *Deleted

## 2017-11-21 ENCOUNTER — Ambulatory Visit: Payer: Self-pay | Admitting: Pharmacist

## 2017-11-21 NOTE — Patient Outreach (Signed)
[facility]) Care Management  11/20/2017  Samuel BusingJohn D [name] 05/22/1951 [id number]  RN attempted outreach call to pt however only able to leave a HIPAA approved voice message requesting a call back. Plan to close case due to [facility] involvement. Note RN has received a call from [facility] last week pending a home visit with this pt. Will attempt one addition time on tomorrow prior to case closure.   Samuel CousinLisa Dabid [place], RN Care Management Coordinator [facility] Main Office [phone]

## 2017-11-21 NOTE — Patient Outreach (Signed)
[facility]) Care Management  11/21/2017  Samuel [name] Feb 13, 1951 [id number]    RN attempted once again to notify pt of case closure however informed on the last home visit pending [facility] to start. Will send letter and alert other care team members. Will also alert primary provider of pt's disposition with [facility] services with this discipline no longer involved with pt's management of care. Again goal have not been met due to other case management services involved.   Raina Mina, RN Care Management Coordinator [facility] Office [phone]

## 2017-11-22 ENCOUNTER — Emergency Department (HOSPITAL_COMMUNITY): Payer: PPO

## 2017-11-22 ENCOUNTER — Emergency Department (HOSPITAL_COMMUNITY)
Admission: EM | Admit: 2017-11-22 | Discharge: 2017-11-22 | Disposition: A | Discharge status: Home / Self Care | Payer: PPO | Attending: Emergency Medicine | Admitting: Emergency Medicine

## 2017-11-22 ENCOUNTER — Other Ambulatory Visit: Payer: Self-pay

## 2017-11-22 ENCOUNTER — Encounter (HOSPITAL_COMMUNITY): Payer: Self-pay | Admitting: Emergency Medicine

## 2017-11-22 DIAGNOSIS — R0989 Other specified symptoms and signs involving the circulatory and respiratory systems: Secondary | ICD-10-CM | POA: Diagnosis not present

## 2017-11-22 DIAGNOSIS — I2510 Atherosclerotic heart disease of native coronary artery without angina pectoris: Secondary | ICD-10-CM | POA: Diagnosis not present

## 2017-11-22 DIAGNOSIS — Z7901 Long term (current) use of anticoagulants: Secondary | ICD-10-CM | POA: Diagnosis not present

## 2017-11-22 DIAGNOSIS — R0602 Shortness of breath: Secondary | ICD-10-CM | POA: Diagnosis not present

## 2017-11-22 DIAGNOSIS — Z794 Long term (current) use of insulin: Secondary | ICD-10-CM | POA: Diagnosis not present

## 2017-11-22 DIAGNOSIS — Z955 Presence of coronary angioplasty implant and graft: Secondary | ICD-10-CM | POA: Insufficient documentation

## 2017-11-22 DIAGNOSIS — Z7902 Long term (current) use of antithrombotics/antiplatelets: Secondary | ICD-10-CM | POA: Diagnosis not present

## 2017-11-22 DIAGNOSIS — R0902 Hypoxemia: Secondary | ICD-10-CM | POA: Diagnosis not present

## 2017-11-22 DIAGNOSIS — K3184 Gastroparesis: Secondary | ICD-10-CM | POA: Diagnosis not present

## 2017-11-22 DIAGNOSIS — I10 Essential (primary) hypertension: Secondary | ICD-10-CM | POA: Insufficient documentation

## 2017-11-22 DIAGNOSIS — J449 Chronic obstructive pulmonary disease, unspecified: Secondary | ICD-10-CM | POA: Insufficient documentation

## 2017-11-22 DIAGNOSIS — R531 Weakness: Secondary | ICD-10-CM | POA: Diagnosis not present

## 2017-11-22 DIAGNOSIS — E119 Type 2 diabetes mellitus without complications: Secondary | ICD-10-CM | POA: Insufficient documentation

## 2017-11-22 DIAGNOSIS — Z7982 Long term (current) use of aspirin: Secondary | ICD-10-CM | POA: Insufficient documentation

## 2017-11-22 DIAGNOSIS — I252 Old myocardial infarction: Secondary | ICD-10-CM | POA: Insufficient documentation

## 2017-11-22 DIAGNOSIS — R110 Nausea: Secondary | ICD-10-CM | POA: Diagnosis not present

## 2017-11-22 LAB — CBC
HEMATOCRIT: 39.2 % (ref 39.0–52.0)
HEMOGLOBIN: 13.4 g/dL (ref 13.0–17.0)
MCH: 31.2 pg (ref 26.0–34.0)
MCHC: 34.2 g/dL (ref 30.0–36.0)
MCV: 91.2 fL (ref 78.0–100.0)
Platelets: 272 10*3/uL (ref 150–400)
RBC: 4.3 MIL/uL (ref 4.22–5.81)
RDW: 12.6 % (ref 11.5–15.5)
WBC: 8.7 10*3/uL (ref 4.0–10.5)

## 2017-11-22 LAB — URINALYSIS, ROUTINE W REFLEX MICROSCOPIC
BACTERIA UA: NONE SEEN
BILIRUBIN URINE: NEGATIVE
Glucose, UA: >=500 mg/dL — AB
HGB URINE DIPSTICK: NEGATIVE
Ketones, ur: 5 mg/dL — AB
LEUKOCYTES UA: NEGATIVE
NITRITE: NEGATIVE
PROTEIN: 30 mg/dL — AB
SPECIFIC GRAVITY, URINE: 1.013 (ref 1.005–1.030)
pH: 8 (ref 5.0–8.0)

## 2017-11-22 LAB — COMPREHENSIVE METABOLIC PANEL
ALBUMIN: 3.5 g/dL (ref 3.5–5.0)
ALK PHOS: 89 U/L (ref 38–126)
ALT: 13 U/L — ABNORMAL LOW (ref 17–63)
ANION GAP: 8 (ref 5–15)
AST: 15 U/L (ref 15–41)
BILIRUBIN TOTAL: 0.8 mg/dL (ref 0.3–1.2)
BUN: 18 mg/dL (ref 6–20)
CALCIUM: 9 mg/dL (ref 8.9–10.3)
CO2: 30 mmol/L (ref 22–32)
Chloride: 100 mmol/L — ABNORMAL LOW (ref 101–111)
Creatinine, Ser: 0.82 mg/dL (ref 0.61–1.24)
GFR calc Af Amer: >60 mL/min (ref >60)
GFR calc non Af Amer: >60 mL/min (ref >60)
GLUCOSE: 187 mg/dL — AB (ref 65–99)
POTASSIUM: 4.1 mmol/L (ref 3.5–5.1)
SODIUM: 138 mmol/L (ref 135–145)
TOTAL PROTEIN: 7 g/dL (ref 6.5–8.1)

## 2017-11-22 LAB — I-STAT TROPONIN, ED: Troponin i, poc: 0 ng/mL (ref 0.00–0.08)

## 2017-11-22 LAB — LIPASE, BLOOD: Lipase: 17 U/L (ref 11–51)

## 2017-11-22 MED ORDER — METOCLOPRAMIDE HCL 5 MG/ML IJ SOLN
5.0000 mg | Freq: Once | INTRAMUSCULAR | Status: AC
  Administered 2017-11-22: 5 mg via INTRAVENOUS
  Filled 2017-11-22: qty 2

## 2017-11-22 MED ORDER — LORAZEPAM 2 MG/ML IJ SOLN
0.5000 mg | Freq: Once | INTRAMUSCULAR | Status: AC
  Administered 2017-11-22: 0.5 mg via INTRAVENOUS
  Filled 2017-11-22: qty 1

## 2017-11-22 MED ORDER — PROMETHAZINE HCL 25 MG/ML IJ SOLN
12.5000 mg | Freq: Once | INTRAMUSCULAR | Status: AC
  Administered 2017-11-22: 12.5 mg via INTRAVENOUS
  Filled 2017-11-22: qty 1

## 2017-11-22 MED ORDER — SODIUM CHLORIDE 0.9 % IV BOLUS
500.0000 mL | Freq: Once | INTRAVENOUS | Status: AC
  Administered 2017-11-22: 500 mL via INTRAVENOUS

## 2017-11-22 MED ORDER — SODIUM CHLORIDE 0.9 % IV BOLUS: 1000.0000 mL | Freq: Once | INTRAVENOUS | Status: DC

## 2017-11-22 MED ORDER — DIPHENHYDRAMINE HCL 50 MG/ML IJ SOLN
12.5000 mg | Freq: Once | INTRAMUSCULAR | Status: AC
  Administered 2017-11-22: 12.5 mg via INTRAVENOUS
  Filled 2017-11-22: qty 1

## 2017-11-22 MED ORDER — ONDANSETRON HCL 4 MG/2ML IJ SOLN: 4.0000 mg | Freq: Once | INTRAMUSCULAR | Status: DC | PRN

## 2017-11-22 NOTE — ED Notes (Signed)
Bed: ZO10WA23 Expected date:  Expected time:  Means of arrival:  Comments: 67 yo SOB

## 2017-11-22 NOTE — ED Provider Notes (Signed)
[facility]-EMERGENCY DEPT Provider Note   CSN: [id number] Arrival date & time: 11/22/17  1100     History   Chief Complaint Chief Complaint  Patient presents with  . Emesis  . Shortness of Breath  . Weakness    HPI Samuel [name] is a 67 y.o. male with history of anxiety, arthritis, CAD, gastroparesis, hyperlipidemia, hypertension, MI, PVD, OSA, type 2 diabetes, and PE presents for evaluation of acute onset, intermittent shortness of breath and persistent nausea which began earlier today.  He has a history of gastroparesis and chronic shortness of breath secondary to known PEs.  He has an IVC filter and states he has been compliant with his Eliquis.  Shortness of breath is intermittent, worsens with exertion.  He tells me his shortness of breath is essentially at his baseline and he is more concerned with his nausea.  He denies associated chest pain.  He notes persistent nausea and "dry heaving" but denies vomiting.  He denies abdominal pain, diarrhea, constipation, melena, hematochezia, hematemesis, or hematuria.  He denies urinary symptoms.  No fevers or chills.  No medications prior to arrival because he has not been able to tolerate any secondary to his nausea.  He states Phenergan is usually quite helpful for his gastroparesis.  No recent travel or surgeries, no hemoptysis.  The history is provided by the patient.    Past Medical History:  Diagnosis Date  . Anxiety   . Arthritis    "all over"   . CAD (coronary artery disease)   . Charcot's joint    "left foot"  . 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 Depression   . Gastroparesis   . GERD (gastroesophageal reflux disease)   . H/O hiatal hernia   . Hyperlipidemia   . Hypertension   . Myocardial infarction (HCC) 2017/03/27   around this date  . OSA (obstructive sleep apnea)    "not bad enough for a mask"  . Peripheral neuropathy   . Peripheral vascular disease (HCC)   . PONV  (postoperative nausea and vomiting)   . Pulmonary embolism (HCC)    hx. of 2012  . Shortness of breath    exertion  . Type II diabetes mellitus [facility])     Patient Active Problem List   Diagnosis Date Noted  . Uncontrolled diabetes mellitus (HCC) 09/23/2017  . DKA, type 2, not at goal [facility]) 09/22/2017  . S/P BKA (below knee amputation) (HCC) 04/19/2017  . COPD (chronic obstructive pulmonary disease) (HCC) 04/19/2017  . NSTEMI (non-ST elevated myocardial infarction) (HCC) 04/07/2017  . Osteomyelitis of foot (HCC)   . Protein-calorie malnutrition, severe (HCC) 03/08/2015  . Osteomyelitis of foot, acute (HCC) 03/07/2015  . Sepsis (HCC) 03/07/2015  . Diabetes mellitus type 2, uncontrolled (HCC) 03/07/2015  . Normocytic normochromic anemia 03/07/2015  . Obesity (BMI 30-39.9) 03/07/2015  . Nausea & vomiting   . DM (diabetes mellitus), type 2 (HCC) 01/07/2014  . Chronic osteomyelitis involving lower leg (HCC) 01/07/2014  . Vomiting 12/22/2013  . Gastroparesis 12/21/2013  . Intractable nausea and vomiting 12/21/2013  . Essential hypertension 05/01/2013  . Hyperlipidemia due to type 2 diabetes mellitus (HCC) 05/01/2013  . Generalized weakness 03/20/2013  . Back pain 03/20/2013  . Pulmonary embolism (HCC) 03/27/2011  . PERFORATION OF GALLBLADDER 08/30/2010  . Ulcer of lower limb, unspecified 03/01/2010  . METHICILLIN SUSCEPTIBLE STAPH AUREUS SEPTICEMIA 01/25/2010  . Acute osteomyelitis, ankle and foot 01/25/2010  . NAUSEA AND VOMITING 01/25/2010  .  GERD 11/09/2009  . CAD S/P DES PCI to mLAD: Xience DES 2.75 x 15 (3.0 mm) 05/01/2009  . DIABETES, TYPE 1 05/14/2008  . OBSTRUCTIVE SLEEP APNEA 04/30/2008  . ALLERGIC RHINITIS, SEASONAL 04/30/2008    Past Surgical History:  Procedure Laterality Date  . AMPUTATION Left 03/08/2015   Procedure:  LEFT AMPUTATION BELOW KNEE;  Surgeon: Toni Arthurs, MD;  Location: [facility] ORS;  Service: Orthopedics;  Laterality: Left;  . APPLICATION OF WOUND VAC  Left 01/07/2014  . CHOLECYSTECTOMY  06/2010  . CORONARY ANGIOPLASTY WITH STENT PLACEMENT  05/2009   "1"  . FOOT SURGERY Left 2010   "for Charcot's joint"  . HARDWARE REMOVAL Left 01/07/2014   Procedure: LEFT LEG REMOVAL OF DEEP IMPLANT AND SEQUESTRECTOMY; APPLICATION OF WOUND VAC ;  Surgeon: Toni Arthurs, MD;  Location: [facility] OR;  Service: Orthopedics;  Laterality: Left;  . I&D EXTREMITY Left "multiple"   leg  . 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 IM NAILING TIBIA Left ~ 2012  . LEFT HEART CATH AND CORONARY ANGIOGRAPHY N/A 04/08/2017   Procedure: LEFT HEART CATH AND CORONARY ANGIOGRAPHY;  Surgeon: Runell Gess, MD;  Location: [facility] INVASIVE CV LAB;  Service: Cardiovascular;  Laterality: N/A;  . SHOULDER ARTHROSCOPY W/ ROTATOR CUFF REPAIR Left    "and bone spurs"  . TIBIAL IM ROD REMOVAL Left 01/07/2014  . TOE AMPUTATION Right ~ 2011   "great toe"  . VENA CAVA FILTER PLACEMENT  2012  . VENA CAVA FILTER PLACEMENT N/A 11/20/2016   Procedure: INSERTION VENA-CAVA FILTER;  Surgeon: Sherren Kerns, MD;  Location: [facility] OR;  Service: Vascular;  Laterality: N/A;  . WOUND DEBRIDEMENT Left 01/07/2014   "tibia"        Home Medications    Prior to Admission medications   Medication Sig Start Date End Date Taking? Authorizing Provider  acarbose (PRECOSE) 50 MG tablet Take 50 mg 3 (three) times daily with meals by mouth.   Yes [name], MD  amLODipine (NORVASC) 10 MG tablet Take 1 tablet (10 mg total) by mouth daily. 11/22/16  Yes Richarda Overlie, MD  apixaban (ELIQUIS) 5 MG TABS tablet Take 1 tablet (5 mg total) by mouth 2 (two) times daily. 09/24/17  Yes Osvaldo Shipper, MD  aspirin 81 MG EC tablet Take 1 tablet (81 mg total) by mouth daily. 04/12/17  Yes Glade Lloyd, MD  atorvastatin (LIPITOR) 80 MG tablet Take 1 tablet (80 mg total) by mouth daily at 6  PM. 04/11/17  Yes Hanley Ben, Kshitiz, MD  clopidogrel (PLAVIX) 75 MG tablet Take 1 tablet (75 mg total) by mouth daily. 04/12/17  Yes Glade Lloyd, MD  docusate sodium (COLACE) 100 MG capsule Take 100 mg by mouth daily as needed (for constipation).    Yes [provider]  ferrous sulfate 325 (65 FE) MG tablet Take 325 mg by mouth every evening.    Yes [provider]  furosemide (LASIX) 20 MG tablet Take 1 tablet (20 mg total) by mouth daily. 04/12/17  Yes Glade Lloyd, MD  gabapentin (NEURONTIN) 300 MG capsule Take 300 mg by mouth 3 (three) times daily.   Yes [provider]  ibuprofen (ADVIL,MOTRIN) 200 MG tablet Take 400 mg every 6 (six) hours as needed by mouth for headache or pain.   Yes [provider]  insulin detemir (LEVEMIR) 100 UNIT/ML injection Inject  0.25 mLs (25 Units total) into the skin 2 (two) times daily. Patient taking differently: Inject 45 Units into the skin 2 (two) times daily.  09/24/17  Yes Osvaldo ShipperKrishnan, Gokul, MD  insulin lispro (HUMALOG) 100 UNIT/ML injection Inject 0-10 Units into the skin 3 (three) times daily with meals. Inject SQ per sliding scale 150-250 (5 units), 251-350(8 units), 351-450(10 units), >450=10 units   Yes [provider]  lansoprazole (PREVACID) 30 MG capsule Take 30 mg by mouth 2 (two) times daily. 03/19/17  Yes [provider]  metoCLOPramide (REGLAN) 5 MG tablet Take 2 tablets (10 mg total) by mouth 4 (four) times daily -  before meals and at bedtime. 09/24/17  Yes Osvaldo ShipperKrishnan, Gokul, MD  metoprolol tartrate (LOPRESSOR) 25 MG tablet Take 1 tablet (25 mg total) by mouth 2 (two) times daily. 04/11/17  Yes Glade LloydAlekh, Kshitiz, MD  promethazine (PHENERGAN) 25 MG tablet Take 25 mg by mouth every 8 (eight) hours as needed for nausea or vomiting.    Yes [provider]  ranitidine (ZANTAC) 150 MG tablet Take 150 mg by mouth 2 (two) times daily.    Yes [provider]  ranolazine (RANEXA) 500 MG 12 hr  tablet Take 1 tablet (500 mg total) by mouth 2 (two) times daily. 04/11/17  Yes Glade LloydAlekh, Kshitiz, MD  traMADol (ULTRAM) 50 MG tablet Take 100 mg by mouth every 12 (twelve) hours as needed (pain).   Yes [provider]  nitroGLYCERIN (NITROSTAT) 0.4 MG SL tablet Place 1 tablet (0.4 mg total) under the tongue every 5 (five) minutes as needed for chest pain. 04/11/17   Glade LloydAlekh, Kshitiz, MD  [facility] test strip Use to test blood sugar twice daily 09/24/17   [provider]  polyethylene glycol (MIRALAX / GLYCOLAX) packet Take 17 g by mouth daily as needed for moderate constipation. Patient not taking: Reported on 11/22/2017 04/11/17   Glade LloydAlekh, Kshitiz, MD    Family History Family History  Problem Relation Age of Onset  . COPD Mother   . Heart disease Mother   . Lung cancer Mother   . Retinal detachment Father   . Alcoholism Brother   . Heart disease Brother   . COPD Brother   . Diabetes Brother   . Hypertension Brother   . Stroke Brother     Social History Social History   Tobacco Use  . Smoking status: Never Smoker  . Smokeless tobacco: Never Used  Substance Use Topics  . Alcohol use: No    Frequency: Never    Comment: 01/07/2014 'might have a wine cooler a couple times/yr"  . Drug use: No     Allergies   Gabapentin; Nabumetone; and Codeine   Review of Systems Review of Systems  Constitutional: Negative for chills and fever.  Respiratory: Positive for shortness of breath. Negative for cough.   Cardiovascular: Negative for chest pain.  Gastrointestinal: Positive for nausea. Negative for abdominal pain, blood in stool, constipation and vomiting.  All other systems reviewed and are negative.    Physical Exam Updated Vital Signs BP 126/66 (BP Location: Left Arm)   Pulse 81   Temp 98.7 F (37.1 C) (Oral)   Resp 16   Ht 6\' 3"  (1.905 m)   Wt (!) 138.8 kg (306 lb)   SpO2 93%   BMI 38.25 kg/m   Physical Exam  Constitutional: He appears well-developed  and well-nourished. No distress.  Morbidly obese, resting in bed in no apparent distress  HENT:  Head: Normocephalic and  atraumatic.  Eyes: Conjunctivae are normal. Right eye exhibits no discharge. Left eye exhibits no discharge.  Neck: Normal range of motion. Neck supple. No JVD present. No tracheal deviation present.  Cardiovascular: Normal rate, regular rhythm and intact distal pulses.  2+ radial pulses bilaterally and right DP/PT pulses, no calf tenderness, no palpable cords.  Pulmonary/Chest: Tachypnea noted.  Mildly tachypneic, globally diminished breath sounds.  Equal rise and fall of chest.  Speaking in full sentences without difficulty.  Abdominal: Soft. He exhibits no distension. There is no tenderness.  Obese, diminished bowel sounds.   Musculoskeletal: He exhibits no edema.  Left AKA.   Neurological: He is alert.  Skin: Skin is warm and dry. No erythema.  Psychiatric: He has a normal mood and affect. His behavior is normal.  Nursing note and vitals reviewed.    ED Treatments / Results  Labs (all labs ordered are listed, but only abnormal results are displayed) Labs Reviewed  COMPREHENSIVE METABOLIC PANEL - Abnormal; Notable for the following components:      Result Value   Chloride 100 (*)    Glucose, Bld 187 (*)    ALT 13 (*)    All other components within normal limits  URINALYSIS, ROUTINE W REFLEX MICROSCOPIC - Abnormal; Notable for the following components:   Glucose, UA >=500 (*)    Ketones, ur 5 (*)    Protein, ur 30 (*)    All other components within normal limits  LIPASE, BLOOD  CBC  I-STAT TROPONIN, ED    EKG EKG Interpretation  Date/Time:  Friday November 22 2017 11:15:36 EDT Ventricular Rate:  78 PR Interval:    QRS Duration: 102 QT Interval:  423 QTC Calculation: 482 R Axis:   35 Text Interpretation:  Sinus rhythm Abnormal R-wave progression, early transition ST elevation, consider inferior injury Borderline prolonged QT interval Confirmed by  Raeford Razor [phone]) on 11/22/2017 12:08:05 PM   Radiology Dg Chest 2 View  Result Date: 11/22/2017 CLINICAL DATA:  Nausea, vomiting and weakness beginning earlier today. EXAM: CHEST - 2 VIEW COMPARISON:  None. FINDINGS: The heart is enlarged. Lung volumes are decreased. There may be mild vascular congestion, but no consolidation or edema. No effusion or pneumothorax. Bones unremarkable. IMPRESSION: Low lung volumes with cardiomegaly, and mild vascular congestion. No consolidation or edema. Similar appearance to priors. Electronically Signed   By: Elsie Stain M.D.   On: 11/22/2017 12:45    Procedures Procedures (including critical care time)  Medications Ordered in ED Medications  promethazine (PHENERGAN) injection 12.5 mg (12.5 mg Intravenous Given 11/22/17 1221)  sodium chloride 0.9 % bolus 500 mL (0 mLs Intravenous Stopped 11/22/17 1545)  metoCLOPramide (REGLAN) injection 5 mg (5 mg Intravenous Given 11/22/17 1539)  diphenhydrAMINE (BENADRYL) injection 12.5 mg (12.5 mg Intravenous Given 11/22/17 1537)  LORazepam (ATIVAN) injection 0.5 mg (0.5 mg Intravenous Given 11/22/17 1541)     Initial Impression / Assessment and Plan / ED Course  I have reviewed the triage vital signs and the nursing notes.  Pertinent labs & imaging results that were available during my care of the patient were reviewed by me and considered in my medical decision making (see chart for details).     Patient presents for evaluation of nausea with no vomiting or abdominal pain as well as shortness of breath.  He is afebrile, initially mildly hypertensive and tachypneic with resolution on reevaluation.  He is nontoxic in appearance.  He states his shortness of breath is at his baseline, no chest  pain and troponin is negative.  EKG shows no acute changes from last tracing.  I doubt ACS or MI.  Chest x-ray shows mild vascular congestion, similar in appearance from prior x-rays.  He does not appear to be significantly volume  overloaded.  Abdominal examination is entirely benign.  I doubt obstruction, perforation, appendicitis, colitis, AAA, or other acute surgical abdominal pathology.  Patient has known PEs and has been compliant with his Xarelto.  He is not hypoxic and exhibits no significant increased work of breathing.  I do not see an emergent need to obtain a CTA of the chest.  No evidence of pneumonia or pleural effusion.  Lab work reviewed by me shows no leukocytosis.  H&H has actually improved since lab work was last obtained and has now returned to his baseline.  He has no significant electrolyte abnormality is, glucose elevated at 187.  UA is not concerning for UTI or nephrolithiasis but is suggestive of dehydration.  We will give a small fluid bolus.  He was also given Phenergan and on reevaluation states his nausea has not significantly improved.  We will give Benadryl, Ativan, and Reglan for his gastroparesis and reassess.  Suspect he will likely be stable for discharge home if his symptoms can be controlled.  4:06 PM Patient states he is feeling much better.  He is tolerating p.o. fluids without difficulty.  He has Phenergan and Reglan at home.  He has follow-up scheduled with his gastroenterologist in 4 days.  He is stable for discharge home with follow-up as scheduled.  Discussed strict ED return precautions.  Patient and patient's family members verbalized understanding of and agreement with plan and patient is stable for discharge home at this time.  Patient seen and evaluated by Dr. Freida Busman who agrees with assessment and plan at this time.  No complaints prior to discharge.  Final Clinical Impressions(s) / ED Diagnoses   Final diagnoses:  Gastroparesis  SOB (shortness of breath)    ED Discharge Orders    None       Jeanie Sewer, PA-C 11/23/17 0557    Lorre Nick, MD 11/23/17 1156

## 2017-11-22 NOTE — Discharge Instructions (Signed)
Continue taking your home medications as prescribed.  Eat a diet of bland foods that will not upset your stomach including broth, mashed potatoes, plain chicken, and crackers.  Eat small meals frequently throughout the day to avoid eating too much at one time.  Follow-up with your gastroenterologist for reevaluation of your symptoms as scheduled.  Return to the emergency department if any concerning signs or symptoms develop such as persistent vomiting, abdominal pain, fevers, chest pain, worsening shortness of breath, or blood in the urine or stool.

## 2017-11-22 NOTE — ED Provider Notes (Signed)
Medical screening examination/treatment/procedure(s) were conducted as a shared visit with non-physician practitioner(s) and myself.  I personally evaluated the patient during the encounter.  EKG Interpretation  Date/Time:  Friday November 22 2017 11:15:36 EDT Ventricular Rate:  78 PR Interval:    QRS Duration: 102 QT Interval:  423 QTC Calculation: 482 R Axis:   35 Text Interpretation:  Sinus rhythm Abnormal R-wave progression, early transition ST elevation, consider inferior injury Borderline prolonged QT interval Confirmed by Raeford RazorKohut, Stephen [phone]([id number]) on 11/22/2017 12:7008:4005 PM 67 year old male here complaining of nausea secondary to chronic gastroparesis.  Denies any fever or abdominal discomfort.  States his dyspnea is at baseline at this time.  His abdominal exam is benign.  He does not have any guarding rebound.  Patient's rotatory status is stable.  Will treat with IV antibiotics and discharge when stable   Lorre NickAllen, Alora Gorey, MD 11/22/17 1515

## 2017-11-22 NOTE — ED Triage Notes (Signed)
Patient BIB [facility] from home for n/v/weakness starting today.  Ems report pt dry heaving. Pt also c/o ongoing SOB worse today. Lung sounds clear per EMS. Pt has hx of PE and is on bld thinner. Hx of cardiac stent.

## 2017-11-25 ENCOUNTER — Other Ambulatory Visit: Payer: Self-pay | Admitting: Pharmacy Technician

## 2017-11-25 NOTE — Patient Outreach (Signed)
[facility] Care Management  11/25/2017  Beckie BusingJohn D Thorp 04-06-1951 [id number]  Follow up call to [name] to check status of patients application. Spoke to BrunsonKathy who stated that patient had previously been approved 05/29/17, patient case was reviewed in March and extended to 06/17/18. Patient will need to reapply in 2020. Olegario MessierKathy put in a request to pharmacy to have medication shipped and patient should expect it in 7-10 business days.  Will follow up with patient in the next 7-10 business days to confirm delivery.  Suzan SlickAshley N. Ernesta Ambleoleman, CPhT [facility] Care Management [phone]

## 2017-11-28 ENCOUNTER — Other Ambulatory Visit: Payer: Self-pay | Admitting: Pharmacist

## 2017-11-28 ENCOUNTER — Other Ambulatory Visit: Payer: Self-pay | Admitting: Pharmacy Technician

## 2017-11-28 NOTE — Patient Outreach (Signed)
[facility] Care Management  11/28/2017  Beckie BusingJohn D Brocks October 14, 1950 [id number]   Patient was called to follow up on medication assistance. HIPAA identifiers were obtained.  Patient reported that he received a 51105-month supply of Eliquis [name].    Patient visited the ED 11/22/17 for Gastroparesis.  He was treated acutely for Gastroparesis in the ED with Benadryl, lorazepam and metoclopramide then released home without any medication changes.   Patient wondered if he could get some assistance with obtaining insulin from patient assistance programs  Patient is on Levemir by [employer].  [employer] program requires patients to spend at least $1000 in out-of-pocket medication expenses.  If switched to Basaglar, we MAY be able to submit a hardship letter and get both Humalog and Basaglar from [employer] as they will accept hardship letters and [employer] will not.  Plan:  Route letter to [facility] Pharmacy Technician, Lilla ShookAshley Coleman to send a Lilly application to the patient.  Send Dr. Selena BattenKim a letter and request patient be changed to [facility] for patient assistance purposes.    Follow up in 7-10 business days.  Beecher McardleKatina J. Antinio Sanderfer, PharmD, BCACP [facility] Clinical Pharmacist [phone]

## 2017-11-28 NOTE — Patient Outreach (Addendum)
[facility] Care Management  11/28/2017  [name] July 08, 1950 [id number]  Received [name] patient assistance referral from [facility] Nunzio CobbsKatina Boyd for Humalog and [employer]. Prepared patient portion along with letter of hardship to be mailed out. Faxed provider portion to Dr. Selena BattenKim.  Will follow up with patient in 5-7 business days to verify application has been received.  Suzan SlickAshley N. Ernesta Ambleoleman, CPhT [facility] Care Management [phone]

## 2017-12-01 DIAGNOSIS — R0902 Hypoxemia: Secondary | ICD-10-CM | POA: Diagnosis not present

## 2017-12-02 ENCOUNTER — Encounter (HOSPITAL_COMMUNITY): Payer: Self-pay | Admitting: Emergency Medicine

## 2017-12-02 ENCOUNTER — Emergency Department (HOSPITAL_COMMUNITY)
Admission: EM | Admit: 2017-12-02 | Discharge: 2017-12-02 | Disposition: A | Discharge status: Home / Self Care | Payer: PPO | Attending: Emergency Medicine | Admitting: Emergency Medicine

## 2017-12-02 ENCOUNTER — Other Ambulatory Visit: Payer: Self-pay

## 2017-12-02 ENCOUNTER — Emergency Department (HOSPITAL_COMMUNITY): Payer: PPO

## 2017-12-02 DIAGNOSIS — Z794 Long term (current) use of insulin: Secondary | ICD-10-CM | POA: Insufficient documentation

## 2017-12-02 DIAGNOSIS — I10 Essential (primary) hypertension: Secondary | ICD-10-CM | POA: Insufficient documentation

## 2017-12-02 DIAGNOSIS — Z79899 Other long term (current) drug therapy: Secondary | ICD-10-CM | POA: Diagnosis not present

## 2017-12-02 DIAGNOSIS — Z7902 Long term (current) use of antithrombotics/antiplatelets: Secondary | ICD-10-CM | POA: Insufficient documentation

## 2017-12-02 DIAGNOSIS — Z955 Presence of coronary angioplasty implant and graft: Secondary | ICD-10-CM | POA: Diagnosis not present

## 2017-12-02 DIAGNOSIS — R079 Chest pain, unspecified: Secondary | ICD-10-CM

## 2017-12-02 DIAGNOSIS — Z7982 Long term (current) use of aspirin: Secondary | ICD-10-CM | POA: Diagnosis not present

## 2017-12-02 DIAGNOSIS — E1110 Type 2 diabetes mellitus with ketoacidosis without coma: Secondary | ICD-10-CM | POA: Diagnosis not present

## 2017-12-02 DIAGNOSIS — I2510 Atherosclerotic heart disease of native coronary artery without angina pectoris: Secondary | ICD-10-CM | POA: Diagnosis not present

## 2017-12-02 DIAGNOSIS — K3184 Gastroparesis: Secondary | ICD-10-CM | POA: Diagnosis not present

## 2017-12-02 LAB — COMPREHENSIVE METABOLIC PANEL
ALBUMIN: 3.4 g/dL — AB (ref 3.5–5.0)
ALT: 16 U/L — AB (ref 17–63)
AST: 17 U/L (ref 15–41)
Alkaline Phosphatase: 80 U/L (ref 38–126)
Anion gap: 10 (ref 5–15)
BUN: 15 mg/dL (ref 6–20)
CHLORIDE: 101 mmol/L (ref 101–111)
CO2: 27 mmol/L (ref 22–32)
Calcium: 9.3 mg/dL (ref 8.9–10.3)
Creatinine, Ser: 1.09 mg/dL (ref 0.61–1.24)
GFR calc non Af Amer: >60 mL/min (ref >60)
GLUCOSE: 241 mg/dL — AB (ref 65–99)
Potassium: 3.9 mmol/L (ref 3.5–5.1)
SODIUM: 138 mmol/L (ref 135–145)
Total Bilirubin: 0.7 mg/dL (ref 0.3–1.2)
Total Protein: 7.1 g/dL (ref 6.5–8.1)

## 2017-12-02 LAB — CBC WITH DIFFERENTIAL/PLATELET
ABS IMMATURE GRANULOCYTES: 0 10*3/uL (ref 0.0–0.1)
BASOS ABS: 0.1 10*3/uL (ref 0.0–0.1)
BASOS PCT: 1 %
Eosinophils Absolute: 0.2 10*3/uL (ref 0.0–0.7)
Eosinophils Relative: 2 %
HCT: 42.9 % (ref 39.0–52.0)
HEMOGLOBIN: 14.2 g/dL (ref 13.0–17.0)
IMMATURE GRANULOCYTES: 0 %
LYMPHS PCT: 15 %
Lymphs Abs: 1.5 10*3/uL (ref 0.7–4.0)
MCH: 30.5 pg (ref 26.0–34.0)
MCHC: 33.1 g/dL (ref 30.0–36.0)
MCV: 92.3 fL (ref 78.0–100.0)
Monocytes Absolute: 0.7 10*3/uL (ref 0.1–1.0)
Monocytes Relative: 7 %
NEUTROS ABS: 7.2 10*3/uL (ref 1.7–7.7)
NEUTROS PCT: 75 %
Platelets: 272 10*3/uL (ref 150–400)
RBC: 4.65 MIL/uL (ref 4.22–5.81)
RDW: 12.3 % (ref 11.5–15.5)
WBC: 9.6 10*3/uL (ref 4.0–10.5)

## 2017-12-02 LAB — I-STAT TROPONIN, ED: Troponin i, poc: 0 ng/mL (ref 0.00–0.08)

## 2017-12-02 LAB — LIPASE, BLOOD: Lipase: 20 U/L (ref 11–51)

## 2017-12-02 LAB — I-STAT CG4 LACTIC ACID, ED: LACTIC ACID, VENOUS: 1.97 mmol/L — AB (ref 0.5–1.9)

## 2017-12-02 MED ORDER — SODIUM CHLORIDE 0.9 % IV BOLUS
1000.0000 mL | Freq: Once | INTRAVENOUS | Status: AC
  Administered 2017-12-02: 1000 mL via INTRAVENOUS

## 2017-12-02 MED ORDER — METOCLOPRAMIDE HCL 5 MG/ML IJ SOLN
10.0000 mg | Freq: Once | INTRAMUSCULAR | Status: AC
  Administered 2017-12-02: 10 mg via INTRAVENOUS
  Filled 2017-12-02: qty 2

## 2017-12-02 NOTE — ED Provider Notes (Signed)
[facility] EMERGENCY DEPARTMENT Provider Note   CSN: [id number] Arrival date & time: 12/02/17  0011     History   Chief Complaint Chief Complaint  Patient presents with  . Nausea  . Chest Pain    HPI Samuel [name] is a 67 y.o. male.  Patient presents to the emergency department for evaluation of chest discomfort.  Patient reports that he has been experiencing severe nausea without vomiting tonight.  He also has had some atypical discomfort in his chest that he cannot quite describe.  He reports he has an aching pain in the center of his chest that he has not experienced before.  Patient reports he has had a previous MI and the symptoms are not similar to his MI.  The discomfort he is expensing, however, is also different than his gastroparesis symptoms.  He is not experiencing any shortness of breath.  He reports that he became concerned because after he started having the symptoms he started to have flushing and sweating that lasted for 20 to 30 minutes and then resolved.  At that point he started to feel like he needed to have a bowel movement as well.     Past Medical History:  Diagnosis Date  . Anxiety   . Arthritis    "all over"   . CAD (coronary artery disease)   . Charcot's joint    "left foot"  . 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 Depression   . Gastroparesis   . GERD (gastroesophageal reflux disease)   . H/O hiatal hernia   . Hyperlipidemia   . Hypertension   . Myocardial infarction (HCC) 2017/03/27   around this date  . OSA (obstructive sleep apnea)    "not bad enough for a mask"  . Peripheral neuropathy   . Peripheral vascular disease (HCC)   . PONV (postoperative nausea and vomiting)   . Pulmonary embolism (HCC)    hx. of 2012  . Shortness of breath    exertion  . Type II diabetes mellitus [facility])     Patient Active Problem List   Diagnosis Date Noted  . Uncontrolled diabetes mellitus (HCC) 09/23/2017  .  DKA, type 2, not at goal [facility] 09/22/2017  . S/P BKA (below knee amputation) (HCC) 04/19/2017  . COPD (chronic obstructive pulmonary disease) (HCC) 04/19/2017  . NSTEMI (non-ST elevated myocardial infarction) (HCC) 04/07/2017  . Osteomyelitis of foot (HCC)   . Protein-calorie malnutrition, severe (HCC) 03/08/2015  . Osteomyelitis of foot, acute (HCC) 03/07/2015  . Sepsis (HCC) 03/07/2015  . Diabetes mellitus type 2, uncontrolled (HCC) 03/07/2015  . Normocytic normochromic anemia 03/07/2015  . Obesity (BMI 30-39.9) 03/07/2015  . Nausea & vomiting   . DM (diabetes mellitus), type 2 (HCC) 01/07/2014  . Chronic osteomyelitis involving lower leg (HCC) 01/07/2014  . Vomiting 12/22/2013  . Gastroparesis 12/21/2013  . Intractable nausea and vomiting 12/21/2013  . Essential hypertension 05/01/2013  . Hyperlipidemia due to type 2 diabetes mellitus (HCC) 05/01/2013  . Generalized weakness 03/20/2013  . Back pain 03/20/2013  . Pulmonary embolism (HCC) 03/27/2011  . PERFORATION OF GALLBLADDER 08/30/2010  . Ulcer of lower limb, unspecified 03/01/2010  . METHICILLIN SUSCEPTIBLE STAPH AUREUS SEPTICEMIA 01/25/2010  . Acute osteomyelitis, ankle and foot 01/25/2010  . NAUSEA AND VOMITING 01/25/2010  . GERD 11/09/2009  . CAD S/P DES PCI to mLAD: Xience DES 2.75 x 15 (3.0 mm) 05/01/2009  . DIABETES, TYPE 1 05/14/2008  . OBSTRUCTIVE SLEEP APNEA  04/30/2008  . ALLERGIC RHINITIS, SEASONAL 04/30/2008    Past Surgical History:  Procedure Laterality Date  . AMPUTATION Left 03/08/2015   Procedure:  LEFT AMPUTATION BELOW KNEE;  Surgeon: Toni Arthurs, MD;  Location: [facility] ORS;  Service: Orthopedics;  Laterality: Left;  . APPLICATION OF WOUND VAC Left 01/07/2014  . CHOLECYSTECTOMY  06/2010  . CORONARY ANGIOPLASTY WITH STENT PLACEMENT  05/2009   "1"  . FOOT SURGERY Left 2010   "for Charcot's joint"  . HARDWARE REMOVAL Left 01/07/2014   Procedure: LEFT LEG REMOVAL OF DEEP IMPLANT AND SEQUESTRECTOMY; APPLICATION  OF WOUND VAC ;  Surgeon: Toni Arthurs, MD;  Location: [facility] OR;  Service: Orthopedics;  Laterality: Left;  . I&D EXTREMITY Left "multiple"   leg  . 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 IM NAILING TIBIA Left ~ 2012  . LEFT HEART CATH AND CORONARY ANGIOGRAPHY N/A 04/08/2017   Procedure: LEFT HEART CATH AND CORONARY ANGIOGRAPHY;  Surgeon: Runell Gess, MD;  Location: [facility] INVASIVE CV LAB;  Service: Cardiovascular;  Laterality: N/A;  . SHOULDER ARTHROSCOPY W/ ROTATOR CUFF REPAIR Left    "and bone spurs"  . TIBIAL IM ROD REMOVAL Left 01/07/2014  . TOE AMPUTATION Right ~ 2011   "great toe"  . VENA CAVA FILTER PLACEMENT  2012  . VENA CAVA FILTER PLACEMENT N/A 11/20/2016   Procedure: INSERTION VENA-CAVA FILTER;  Surgeon: Sherren Kerns, MD;  Location: [facility] OR;  Service: Vascular;  Laterality: N/A;  . WOUND DEBRIDEMENT Left 01/07/2014   "tibia"        Home Medications    Prior to Admission medications   Medication Sig Start Date End Date Taking? Authorizing Provider  acarbose (PRECOSE) 50 MG tablet Take 50 mg 3 (three) times daily with meals by mouth.   Yes [name], MD  amLODipine (NORVASC) 10 MG tablet Take 1 tablet (10 mg total) by mouth daily. 11/22/16  Yes Richarda Overlie, MD  apixaban (ELIQUIS) 5 MG TABS tablet Take 1 tablet (5 mg total) by mouth 2 (two) times daily. 09/24/17  Yes Osvaldo Shipper, MD  aspirin 81 MG EC tablet Take 1 tablet (81 mg total) by mouth daily. 04/12/17  Yes Glade Lloyd, MD  atorvastatin (LIPITOR) 80 MG tablet Take 1 tablet (80 mg total) by mouth daily at 6 PM. 04/11/17  Yes Hanley Ben, Kshitiz, MD  clopidogrel (PLAVIX) 75 MG tablet Take 1 tablet (75 mg total) by mouth daily. 04/12/17  Yes Glade Lloyd, MD  docusate sodium (COLACE) 100 MG capsule Take 100 mg by mouth daily as needed (for constipation).    Yes [provider]  ferrous sulfate 325 (65 FE) MG tablet Take 325 mg by mouth every evening.    Yes [provider]  furosemide (LASIX) 20 MG tablet Take 1 tablet (20 mg total) by mouth daily. 04/12/17  Yes Glade Lloyd, MD  gabapentin (NEURONTIN) 300 MG capsule Take 300 mg by mouth 3 (three) times daily.   Yes [provider]  ibuprofen (ADVIL,MOTRIN) 200 MG tablet Take 400 mg every 6 (six) hours as needed by mouth for headache or pain.   Yes [provider]  insulin detemir (LEVEMIR) 100 UNIT/ML injection Inject 0.25 mLs (25 Units total) into the skin 2 (two) times daily. Patient taking differently: Inject 50 Units into the skin 2 (two) times daily.  09/24/17  Yes  Osvaldo ShipperKrishnan, Gokul, MD  insulin lispro (HUMALOG) 100 UNIT/ML injection Inject 0-10 Units into the skin 3 (three) times daily with meals. Inject SQ per sliding scale 150-250 (5 units), 251-350(8 units), 351-450(10 units), >450=10 units   Yes [provider]  lansoprazole (PREVACID) 30 MG capsule Take 30 mg by mouth 2 (two) times daily. 03/19/17  Yes [provider]  metoCLOPramide (REGLAN) 5 MG tablet Take 2 tablets (10 mg total) by mouth 4 (four) times daily -  before meals and at bedtime. 09/24/17  Yes Osvaldo ShipperKrishnan, Gokul, MD  metoprolol tartrate (LOPRESSOR) 25 MG tablet Take 1 tablet (25 mg total) by mouth 2 (two) times daily. 04/11/17  Yes Glade LloydAlekh, Kshitiz, MD  nitroGLYCERIN (NITROSTAT) 0.4 MG SL tablet Place 1 tablet (0.4 mg total) under the tongue every 5 (five) minutes as needed for chest pain. 04/11/17  Yes Glade LloydAlekh, Kshitiz, MD  promethazine (PHENERGAN) 25 MG tablet Take 25 mg by mouth every 8 (eight) hours as needed for nausea or vomiting.    Yes [provider]  ranitidine (ZANTAC) 150 MG tablet Take 150 mg by mouth 2 (two) times daily.    Yes [provider]  ranolazine (RANEXA) 500 MG 12 hr tablet Take 1 tablet (500 mg total) by mouth 2 (two) times daily. 04/11/17  Yes Glade LloydAlekh, Kshitiz,  MD  traMADol (ULTRAM) 50 MG tablet Take 100 mg by mouth every 12 (twelve) hours as needed (pain).   Yes [provider]  [facility] test strip Use to test blood sugar twice daily 09/24/17   [provider]  polyethylene glycol (MIRALAX / GLYCOLAX) packet Take 17 g by mouth daily as needed for moderate constipation. Patient not taking: Reported on 11/22/2017 04/11/17   Glade LloydAlekh, Kshitiz, MD    Family History Family History  Problem Relation Age of Onset  . COPD Mother   . Heart disease Mother   . Lung cancer Mother   . Retinal detachment Father   . Alcoholism Brother   . Heart disease Brother   . COPD Brother   . Diabetes Brother   . Hypertension Brother   . Stroke Brother     Social History Social History   Tobacco Use  . Smoking status: Never Smoker  . Smokeless tobacco: Never Used  Substance Use Topics  . Alcohol use: No    Frequency: Never    Comment: 01/07/2014 'might have a wine cooler a couple times/yr"  . Drug use: No     Allergies   Gabapentin; Nabumetone; and Codeine   Review of Systems Review of Systems  Constitutional: Positive for diaphoresis.  Cardiovascular: Positive for chest pain.  Gastrointestinal: Positive for nausea.  All other systems reviewed and are negative.    Physical Exam Updated Vital Signs BP 132/73   Pulse 73   Temp 97.8 F (36.6 C) (Oral)   Resp (!) 21   SpO2 97%   Physical Exam  Constitutional: He is oriented to person, place, and time. He appears well-developed and well-nourished. No distress.  HENT:  Head: Normocephalic and atraumatic.  Right Ear: Hearing normal.  Left Ear: Hearing normal.  Nose: Nose normal.  Mouth/Throat: Oropharynx is clear and moist and mucous membranes are normal.  Eyes: Pupils are equal, round, and reactive to light. Conjunctivae and EOM are normal.  Neck: Normal range of motion. Neck supple.  Cardiovascular: Regular rhythm, S1 normal and S2 normal. Exam reveals no gallop and no  friction rub.  No murmur heard. Pulmonary/Chest: Effort normal and breath sounds  normal. No respiratory distress. He exhibits no tenderness.  Abdominal: Soft. Normal appearance and bowel sounds are normal. There is no hepatosplenomegaly. There is no tenderness. There is no rebound, no guarding, no tenderness at McBurney's point and negative Murphy's sign. No hernia.  Musculoskeletal: Normal range of motion.  Neurological: He is alert and oriented to person, place, and time. He has normal strength. No cranial nerve deficit or sensory deficit. Coordination normal. GCS eye subscore is 4. GCS verbal subscore is 5. GCS motor subscore is 6.  Skin: Skin is warm, dry and intact. No rash noted. No cyanosis.  Psychiatric: He has a normal mood and affect. His speech is normal and behavior is normal. Thought content normal.  Nursing note and vitals reviewed.    ED Treatments / Results  Labs (all labs ordered are listed, but only abnormal results are displayed) Labs Reviewed  COMPREHENSIVE METABOLIC PANEL - Abnormal; Notable for the following components:      Result Value   Glucose, Bld 241 (*)    Albumin 3.4 (*)    ALT 16 (*)    All other components within normal limits  I-STAT CG4 LACTIC ACID, ED - Abnormal; Notable for the following components:   Lactic Acid, Venous 1.97 (*)    All other components within normal limits  CBC WITH DIFFERENTIAL/PLATELET  LIPASE, BLOOD  I-STAT TROPONIN, ED    EKG EKG Interpretation  Date/Time:  Monday December 02 2017 00:43:17 EDT Ventricular Rate:  82 PR Interval:    QRS Duration: 85 QT Interval:  401 QTC Calculation: 469 R Axis:   12 Text Interpretation:  Sinus rhythm Borderline ST elevation, inferior leads No significant change since last tracing Confirmed by Gilda Crease [phone]) on 12/02/2017 1:03:14 AM   Radiology Dg Chest 2 View  Result Date: 12/02/2017 CLINICAL DATA:  Chest pain and nausea.  History of gastroparesis. EXAM: CHEST - 2 VIEW  COMPARISON:  11/22/2017 FINDINGS: Shallow inspiration. Heart size is likely normal for technique. Lungs appear clear and expanded. No blunting of costophrenic angles. No pneumothorax. Mediastinal contours appear intact. Degenerative changes in the spine. IMPRESSION: Shallow inspiration.  No evidence of active pulmonary disease. Electronically Signed   By: Burman Nieves M.D.   On: 12/02/2017 01:50    Procedures Procedures (including critical care time)  Medications Ordered in ED Medications  sodium chloride 0.9 % bolus 1,000 mL (0 mLs Intravenous Stopped 12/02/17 0304)  metoCLOPramide (REGLAN) injection 10 mg (10 mg Intravenous Given 12/02/17 0203)     Initial Impression / Assessment and Plan / ED Course  I have reviewed the triage vital signs and the nursing notes.  Pertinent labs & imaging results that were available during my care of the patient were reviewed by me and considered in my medical decision making (see chart for details).     Patient presents to the emergency department with complaints of flushing, diaphoresis, nausea and aching pain in his chest.  He has a history of recurrent gastroparesis, but feels that the symptoms currently are different than his gastroparesis symptoms.  He also has a cardiac history including MI, current symptoms are not consistent with his previous MI.  Patient was administered IV fluids and Reglan.  His symptoms have completely resolved.  His cardiac evaluation thus far is negative.  EKG does not suggest ischemia or infarct.  Troponin is negative.  Finding discussed with patient.  He is asking to be discharged.  I did have a conversation with him and his family.  I recommended at least repeat troponin because of his cardiac history, although symptoms are atypical for ACS.  He says that he feels fine at this point does not wish to wait for further testing.  He reports that he will return to the ER if he has any recurrence of his symptoms.  Otherwise he  will follow-up with his primary doctor.  Final Clinical Impressions(s) / ED Diagnoses   Final diagnoses:  Chest pain, unspecified type  Gastroparesis    ED Discharge Orders    None       Gilda Crease, MD 12/02/17 [phone]

## 2017-12-02 NOTE — ED Triage Notes (Signed)
Patient here from home with chest pain and nausea.  Patient has history of gastroparesis.  Patient is not having any vomiting at this time and no abdominal pain.  Patient does have some diaphoresis upon arrival and needing to have a BM.

## 2017-12-03 DIAGNOSIS — L0390 Cellulitis, unspecified: Secondary | ICD-10-CM | POA: Diagnosis not present

## 2017-12-03 DIAGNOSIS — E118 Type 2 diabetes mellitus with unspecified complications: Secondary | ICD-10-CM | POA: Diagnosis not present

## 2017-12-05 ENCOUNTER — Other Ambulatory Visit: Payer: Self-pay | Admitting: Pharmacist

## 2017-12-05 ENCOUNTER — Ambulatory Visit: Payer: Self-pay | Admitting: Pharmacist

## 2017-12-05 NOTE — Patient Outreach (Signed)
[facility]) Care Management  12/05/2017  Samuel BusingJohn D [name] 1950/08/23 [id number]   Patient was called regarding medication assistance and to follow up after his recent ER visit for chest pain.  Unfortunately, he did not answer the phone. HIPAA compliant message was left on the patient's voicemail.  Patient assistance forms for insulin were sent to the patient's home last week.  Plan: Follow up with the patient in 5-7 days.  Beecher McardleKatina J. Coutney Wildermuth, PharmD, BCACP [facility] Clinical Pharmacist [phone]

## 2017-12-06 DIAGNOSIS — L0390 Cellulitis, unspecified: Secondary | ICD-10-CM | POA: Diagnosis not present

## 2017-12-10 ENCOUNTER — Other Ambulatory Visit: Payer: Self-pay | Admitting: Pharmacist

## 2017-12-10 DIAGNOSIS — M25561 Pain in right knee: Secondary | ICD-10-CM | POA: Diagnosis not present

## 2017-12-10 DIAGNOSIS — E1165 Type 2 diabetes mellitus with hyperglycemia: Secondary | ICD-10-CM | POA: Diagnosis not present

## 2017-12-10 DIAGNOSIS — K3184 Gastroparesis: Secondary | ICD-10-CM | POA: Diagnosis not present

## 2017-12-10 NOTE — Patient Outreach (Signed)
[facility]) Care Management  12/10/2017  Samuel BusingJohn D [name] 22-Apr-1951 [id number]   Called patient to follow up on medication assistance. HIPAA identifiers were obtained. Patient said he was too tired to review his medications. He is currently receiving case management services through [facility].  [facility] will continue to follow patient until he we get an answer about him getting insulin from [facility].  Patient said he has not received an application in the mail.  The application [facility] from [facility] office in an HTA folder. Patient was asked to look for a HTA folder and call me back.  Plan: Call patient back in 2-3 business days if I do not hear back from him   Samuel McardleKatina J. Amonda Brillhart, PharmD, [facility] University Of PennsylvaniaBCACP [facility] Clinical Pharmacist [phone]

## 2017-12-12 ENCOUNTER — Other Ambulatory Visit: Payer: Self-pay | Admitting: Pharmacy Technician

## 2017-12-12 NOTE — Patient Outreach (Signed)
[facility]) Care Management  12/12/2017  Beckie BusingJohn D Seto 09-21-1950 [id number]   Informed by [facility] RPh Nunzio CobbsKatina Boyd that patient stated he had not received the [facility] patient assistance application that was mailed out to him. Prepared another application and hardship letter to be mailed.  Will follow up with patient in 5-7 business days to confirm application was received.  Suzan SlickAshley N. Ernesta Ambleoleman, CPhT [facility] Care Management [phone]

## 2017-12-13 ENCOUNTER — Ambulatory Visit: Payer: Self-pay | Admitting: Pharmacist

## 2017-12-20 ENCOUNTER — Other Ambulatory Visit: Payer: Self-pay | Admitting: Pharmacy Technician

## 2017-12-20 NOTE — Patient Outreach (Signed)
[facility]) Care Management  12/20/2017  Samuel [name] 23-Aug-1950 [id number]  Successful outreach call to patient, [name] stated that he received the [employer] application and has already mailed it back into me.  Will refax provider portion to Dr. Selena BattenKim.  Will submit completed application to [facility] once all portions are received.  Suzan SlickAshley N. Ernesta Ambleoleman, CPhT [facility] Care Management [phone]

## 2017-12-24 DIAGNOSIS — I2510 Atherosclerotic heart disease of native coronary artery without angina pectoris: Secondary | ICD-10-CM | POA: Diagnosis not present

## 2017-12-24 DIAGNOSIS — Z7901 Long term (current) use of anticoagulants: Secondary | ICD-10-CM | POA: Diagnosis not present

## 2017-12-24 DIAGNOSIS — D62 Acute posthemorrhagic anemia: Secondary | ICD-10-CM | POA: Diagnosis not present

## 2017-12-24 DIAGNOSIS — J438 Other emphysema: Secondary | ICD-10-CM | POA: Diagnosis not present

## 2017-12-24 DIAGNOSIS — G43A1 Cyclical vomiting, intractable: Secondary | ICD-10-CM | POA: Diagnosis not present

## 2017-12-31 ENCOUNTER — Inpatient Hospital Stay (HOSPITAL_COMMUNITY)
Admission: EM | Admit: 2017-12-31 | Discharge: 2018-01-02 | DRG: 689 | Disposition: A | Discharge status: Hospice | Payer: PPO | Attending: Internal Medicine | Admitting: Internal Medicine

## 2017-12-31 ENCOUNTER — Encounter (HOSPITAL_COMMUNITY): Payer: Self-pay | Admitting: *Deleted

## 2017-12-31 ENCOUNTER — Other Ambulatory Visit: Payer: Self-pay

## 2017-12-31 ENCOUNTER — Other Ambulatory Visit: Payer: Self-pay | Admitting: Pharmacist

## 2017-12-31 ENCOUNTER — Ambulatory Visit: Payer: Self-pay | Admitting: Pharmacist

## 2017-12-31 ENCOUNTER — Emergency Department (HOSPITAL_COMMUNITY): Payer: PPO

## 2017-12-31 DIAGNOSIS — Z7902 Long term (current) use of antithrombotics/antiplatelets: Secondary | ICD-10-CM

## 2017-12-31 DIAGNOSIS — Z801 Family history of malignant neoplasm of trachea, bronchus and lung: Secondary | ICD-10-CM

## 2017-12-31 DIAGNOSIS — I252 Old myocardial infarction: Secondary | ICD-10-CM

## 2017-12-31 DIAGNOSIS — I110 Hypertensive heart disease with heart failure: Secondary | ICD-10-CM | POA: Diagnosis not present

## 2017-12-31 DIAGNOSIS — Z66 Do not resuscitate: Secondary | ICD-10-CM | POA: Diagnosis present

## 2017-12-31 DIAGNOSIS — Z811 Family history of alcohol abuse and dependence: Secondary | ICD-10-CM

## 2017-12-31 DIAGNOSIS — E1143 Type 2 diabetes mellitus with diabetic autonomic (poly)neuropathy: Secondary | ICD-10-CM | POA: Diagnosis present

## 2017-12-31 DIAGNOSIS — Z7982 Long term (current) use of aspirin: Secondary | ICD-10-CM

## 2017-12-31 DIAGNOSIS — K219 Gastro-esophageal reflux disease without esophagitis: Secondary | ICD-10-CM | POA: Diagnosis present

## 2017-12-31 DIAGNOSIS — I10 Essential (primary) hypertension: Secondary | ICD-10-CM

## 2017-12-31 DIAGNOSIS — Z9049 Acquired absence of other specified parts of digestive tract: Secondary | ICD-10-CM

## 2017-12-31 DIAGNOSIS — K449 Diaphragmatic hernia without obstruction or gangrene: Secondary | ICD-10-CM | POA: Diagnosis present

## 2017-12-31 DIAGNOSIS — Z885 Allergy status to narcotic agent status: Secondary | ICD-10-CM

## 2017-12-31 DIAGNOSIS — E876 Hypokalemia: Secondary | ICD-10-CM | POA: Diagnosis not present

## 2017-12-31 DIAGNOSIS — E1151 Type 2 diabetes mellitus with diabetic peripheral angiopathy without gangrene: Secondary | ICD-10-CM | POA: Diagnosis not present

## 2017-12-31 DIAGNOSIS — Z823 Family history of stroke: Secondary | ICD-10-CM

## 2017-12-31 DIAGNOSIS — R824 Acetonuria: Secondary | ICD-10-CM | POA: Diagnosis present

## 2017-12-31 DIAGNOSIS — E785 Hyperlipidemia, unspecified: Secondary | ICD-10-CM | POA: Diagnosis present

## 2017-12-31 DIAGNOSIS — Z825 Family history of asthma and other chronic lower respiratory diseases: Secondary | ICD-10-CM

## 2017-12-31 DIAGNOSIS — M159 Polyosteoarthritis, unspecified: Secondary | ICD-10-CM | POA: Diagnosis not present

## 2017-12-31 DIAGNOSIS — R402362 Coma scale, best motor response, obeys commands, at arrival to emergency department: Secondary | ICD-10-CM | POA: Diagnosis present

## 2017-12-31 DIAGNOSIS — K3184 Gastroparesis: Secondary | ICD-10-CM | POA: Diagnosis not present

## 2017-12-31 DIAGNOSIS — I5032 Chronic diastolic (congestive) heart failure: Secondary | ICD-10-CM | POA: Diagnosis not present

## 2017-12-31 DIAGNOSIS — N390 Urinary tract infection, site not specified: Secondary | ICD-10-CM | POA: Diagnosis not present

## 2017-12-31 DIAGNOSIS — IMO0002 Reserved for concepts with insufficient information to code with codable children: Secondary | ICD-10-CM | POA: Diagnosis present

## 2017-12-31 DIAGNOSIS — M86662 Other chronic osteomyelitis, left tibia and fibula: Secondary | ICD-10-CM | POA: Diagnosis not present

## 2017-12-31 DIAGNOSIS — Z9861 Coronary angioplasty status: Secondary | ICD-10-CM | POA: Diagnosis not present

## 2017-12-31 DIAGNOSIS — Z89512 Acquired absence of left leg below knee: Secondary | ICD-10-CM | POA: Diagnosis not present

## 2017-12-31 DIAGNOSIS — Z955 Presence of coronary angioplasty implant and graft: Secondary | ICD-10-CM

## 2017-12-31 DIAGNOSIS — G4733 Obstructive sleep apnea (adult) (pediatric): Secondary | ICD-10-CM | POA: Diagnosis not present

## 2017-12-31 DIAGNOSIS — Z794 Long term (current) use of insulin: Secondary | ICD-10-CM | POA: Diagnosis not present

## 2017-12-31 DIAGNOSIS — Z86711 Personal history of pulmonary embolism: Secondary | ICD-10-CM

## 2017-12-31 DIAGNOSIS — E1142 Type 2 diabetes mellitus with diabetic polyneuropathy: Secondary | ICD-10-CM | POA: Diagnosis not present

## 2017-12-31 DIAGNOSIS — Z888 Allergy status to other drugs, medicaments and biological substances status: Secondary | ICD-10-CM

## 2017-12-31 DIAGNOSIS — I2782 Chronic pulmonary embolism: Secondary | ICD-10-CM | POA: Diagnosis not present

## 2017-12-31 DIAGNOSIS — R112 Nausea with vomiting, unspecified: Secondary | ICD-10-CM | POA: Diagnosis not present

## 2017-12-31 DIAGNOSIS — I2510 Atherosclerotic heart disease of native coronary artery without angina pectoris: Secondary | ICD-10-CM | POA: Diagnosis present

## 2017-12-31 DIAGNOSIS — I2699 Other pulmonary embolism without acute cor pulmonale: Secondary | ICD-10-CM | POA: Diagnosis present

## 2017-12-31 DIAGNOSIS — J9811 Atelectasis: Secondary | ICD-10-CM | POA: Diagnosis not present

## 2017-12-31 DIAGNOSIS — J9601 Acute respiratory failure with hypoxia: Secondary | ICD-10-CM | POA: Diagnosis present

## 2017-12-31 DIAGNOSIS — Z8249 Family history of ischemic heart disease and other diseases of the circulatory system: Secondary | ICD-10-CM

## 2017-12-31 DIAGNOSIS — M86669 Other chronic osteomyelitis, unspecified tibia and fibula: Secondary | ICD-10-CM | POA: Diagnosis present

## 2017-12-31 DIAGNOSIS — R402142 Coma scale, eyes open, spontaneous, at arrival to emergency department: Secondary | ICD-10-CM | POA: Diagnosis present

## 2017-12-31 DIAGNOSIS — I959 Hypotension, unspecified: Secondary | ICD-10-CM | POA: Diagnosis not present

## 2017-12-31 DIAGNOSIS — Z833 Family history of diabetes mellitus: Secondary | ICD-10-CM

## 2017-12-31 DIAGNOSIS — E1165 Type 2 diabetes mellitus with hyperglycemia: Secondary | ICD-10-CM | POA: Diagnosis present

## 2017-12-31 DIAGNOSIS — E1169 Type 2 diabetes mellitus with other specified complication: Secondary | ICD-10-CM

## 2017-12-31 DIAGNOSIS — R402252 Coma scale, best verbal response, oriented, at arrival to emergency department: Secondary | ICD-10-CM | POA: Diagnosis present

## 2017-12-31 DIAGNOSIS — R000 Tachycardia, unspecified: Secondary | ICD-10-CM | POA: Diagnosis not present

## 2017-12-31 LAB — COMPREHENSIVE METABOLIC PANEL
ALBUMIN: 3.4 g/dL — AB (ref 3.5–5.0)
ALK PHOS: 87 U/L (ref 38–126)
ALT: 16 U/L (ref 0–44)
AST: 20 U/L (ref 15–41)
Anion gap: 14 (ref 5–15)
BUN: 20 mg/dL (ref 8–23)
CALCIUM: 8.8 mg/dL — AB (ref 8.9–10.3)
CHLORIDE: 100 mmol/L (ref 98–111)
CO2: 24 mmol/L (ref 22–32)
CREATININE: 0.99 mg/dL (ref 0.61–1.24)
GFR calc Af Amer: >60 mL/min (ref >60)
GFR calc non Af Amer: >60 mL/min (ref >60)
GLUCOSE: 314 mg/dL — AB (ref 70–99)
Potassium: 4.3 mmol/L (ref 3.5–5.1)
SODIUM: 138 mmol/L (ref 135–145)
Total Bilirubin: 1 mg/dL (ref 0.3–1.2)
Total Protein: 7 g/dL (ref 6.5–8.1)

## 2017-12-31 LAB — CBC WITH DIFFERENTIAL/PLATELET
BASOS ABS: 0 10*3/uL (ref 0.0–0.1)
BASOS PCT: 0 %
EOS ABS: 0 10*3/uL (ref 0.0–0.7)
Eosinophils Relative: 1 %
HCT: 39.9 % (ref 39.0–52.0)
HEMOGLOBIN: 13.7 g/dL (ref 13.0–17.0)
LYMPHS ABS: 0.8 10*3/uL (ref 0.7–4.0)
Lymphocytes Relative: 11 %
MCH: 31.3 pg (ref 26.0–34.0)
MCHC: 34.3 g/dL (ref 30.0–36.0)
MCV: 91.1 fL (ref 78.0–100.0)
Monocytes Absolute: 0.3 10*3/uL (ref 0.1–1.0)
Monocytes Relative: 4 %
NEUTROS PCT: 84 %
Neutro Abs: 6.8 10*3/uL (ref 1.7–7.7)
Platelets: 265 10*3/uL (ref 150–400)
RBC: 4.38 MIL/uL (ref 4.22–5.81)
RDW: 12.3 % (ref 11.5–15.5)
WBC: 8 10*3/uL (ref 4.0–10.5)

## 2017-12-31 LAB — URINALYSIS, ROUTINE W REFLEX MICROSCOPIC
BACTERIA UA: NONE SEEN
Bilirubin Urine: NEGATIVE
Glucose, UA: >=500 mg/dL — AB
Ketones, ur: 80 mg/dL — AB
Nitrite: POSITIVE — AB
PROTEIN: 30 mg/dL — AB
SPECIFIC GRAVITY, URINE: 1.025 (ref 1.005–1.030)
pH: 6 (ref 5.0–8.0)

## 2017-12-31 LAB — GLUCOSE, CAPILLARY
GLUCOSE-CAPILLARY: 214 mg/dL — AB (ref 70–99)
Glucose-Capillary: 273 mg/dL — ABNORMAL HIGH (ref 70–99)

## 2017-12-31 LAB — LIPASE, BLOOD: Lipase: 17 U/L (ref 11–51)

## 2017-12-31 MED ORDER — FAMOTIDINE 20 MG PO TABS
20.0000 mg | ORAL_TABLET | Freq: Two times a day (BID) | ORAL | Status: DC
  Administered 2017-12-31 – 2018-01-02 (×4): 20 mg via ORAL
  Filled 2017-12-31 (×5): qty 1

## 2017-12-31 MED ORDER — ACETAMINOPHEN 650 MG RE SUPP: 650.0000 mg | Freq: Four times a day (QID) | RECTAL | Status: DC | PRN

## 2017-12-31 MED ORDER — PROMETHAZINE HCL 25 MG/ML IJ SOLN
25.0000 mg | Freq: Once | INTRAMUSCULAR | Status: AC
  Administered 2017-12-31: 25 mg via INTRAVENOUS
  Filled 2017-12-31: qty 1

## 2017-12-31 MED ORDER — SODIUM CHLORIDE 0.9 % IV SOLN: 1.0000 g | INTRAVENOUS | Status: DC

## 2017-12-31 MED ORDER — INSULIN DETEMIR 100 UNIT/ML ~~LOC~~ SOLN
40.0000 [IU] | Freq: Two times a day (BID) | SUBCUTANEOUS | Status: DC
  Administered 2017-12-31 – 2018-01-02 (×4): 40 [IU] via SUBCUTANEOUS
  Filled 2017-12-31 (×4): qty 0.4

## 2017-12-31 MED ORDER — INSULIN ASPART 100 UNIT/ML ~~LOC~~ SOLN
0.0000 [IU] | Freq: Every day | SUBCUTANEOUS | Status: DC
  Administered 2017-12-31: 2 [IU] via SUBCUTANEOUS

## 2017-12-31 MED ORDER — INSULIN ASPART 100 UNIT/ML ~~LOC~~ SOLN
0.0000 [IU] | Freq: Three times a day (TID) | SUBCUTANEOUS | Status: DC
  Administered 2017-12-31: 5 [IU] via SUBCUTANEOUS
  Administered 2018-01-01 (×2): 2 [IU] via SUBCUTANEOUS
  Administered 2018-01-01: 3 [IU] via SUBCUTANEOUS
  Administered 2018-01-02: 2 [IU] via SUBCUTANEOUS

## 2017-12-31 MED ORDER — APIXABAN 5 MG PO TABS
5.0000 mg | ORAL_TABLET | Freq: Two times a day (BID) | ORAL | Status: DC
  Administered 2017-12-31 – 2018-01-02 (×4): 5 mg via ORAL
  Filled 2017-12-31 (×4): qty 1

## 2017-12-31 MED ORDER — CEFTRIAXONE SODIUM 1 G IJ SOLR
1.0000 g | INTRAMUSCULAR | Status: DC
  Administered 2017-12-31 – 2018-01-01 (×2): 1 g via INTRAVENOUS
  Filled 2017-12-31: qty 10
  Filled 2017-12-31: qty 1
  Filled 2017-12-31: qty 10

## 2017-12-31 MED ORDER — ATORVASTATIN CALCIUM 40 MG PO TABS
80.0000 mg | ORAL_TABLET | Freq: Every day | ORAL | Status: DC
  Administered 2017-12-31 – 2018-01-01 (×2): 80 mg via ORAL
  Filled 2017-12-31 (×3): qty 2

## 2017-12-31 MED ORDER — METOCLOPRAMIDE HCL 5 MG/ML IJ SOLN
5.0000 mg | Freq: Three times a day (TID) | INTRAMUSCULAR | Status: DC
  Administered 2017-12-31 – 2018-01-02 (×5): 5 mg via INTRAVENOUS
  Filled 2017-12-31 (×5): qty 2

## 2017-12-31 MED ORDER — TRAMADOL HCL 50 MG PO TABS
100.0000 mg | ORAL_TABLET | Freq: Four times a day (QID) | ORAL | Status: DC | PRN
  Administered 2017-12-31 – 2018-01-01 (×2): 100 mg via ORAL
  Filled 2017-12-31 (×2): qty 2

## 2017-12-31 MED ORDER — METOPROLOL TARTRATE 25 MG PO TABS
25.0000 mg | ORAL_TABLET | Freq: Two times a day (BID) | ORAL | Status: DC
  Administered 2017-12-31 – 2018-01-02 (×4): 25 mg via ORAL
  Filled 2017-12-31 (×4): qty 1

## 2017-12-31 MED ORDER — PROMETHAZINE HCL 25 MG PO TABS
25.0000 mg | ORAL_TABLET | ORAL | Status: DC | PRN
  Administered 2017-12-31: 50 mg via ORAL
  Filled 2017-12-31: qty 1
  Filled 2017-12-31 (×2): qty 2

## 2017-12-31 MED ORDER — RANOLAZINE ER 500 MG PO TB12
500.0000 mg | ORAL_TABLET | Freq: Two times a day (BID) | ORAL | Status: DC
  Administered 2017-12-31 – 2018-01-02 (×4): 500 mg via ORAL
  Filled 2017-12-31 (×6): qty 1

## 2017-12-31 MED ORDER — ASPIRIN EC 81 MG PO TBEC
81.0000 mg | DELAYED_RELEASE_TABLET | Freq: Every day | ORAL | Status: DC
  Administered 2017-12-31 – 2018-01-02 (×3): 81 mg via ORAL
  Filled 2017-12-31 (×3): qty 1

## 2017-12-31 MED ORDER — ONDANSETRON HCL 4 MG/2ML IJ SOLN
4.0000 mg | Freq: Four times a day (QID) | INTRAMUSCULAR | Status: DC | PRN
  Administered 2017-12-31 – 2018-01-02 (×4): 4 mg via INTRAVENOUS
  Filled 2017-12-31 (×4): qty 2

## 2017-12-31 MED ORDER — ONDANSETRON HCL 4 MG PO TABS: 4.0000 mg | ORAL_TABLET | Freq: Four times a day (QID) | ORAL | Status: DC | PRN

## 2017-12-31 MED ORDER — SODIUM CHLORIDE 0.9 % IV BOLUS
2000.0000 mL | Freq: Once | INTRAVENOUS | Status: AC
  Administered 2017-12-31: 2000 mL via INTRAVENOUS

## 2017-12-31 MED ORDER — GABAPENTIN 300 MG PO CAPS
300.0000 mg | ORAL_CAPSULE | Freq: Four times a day (QID) | ORAL | Status: DC
  Administered 2017-12-31 – 2018-01-02 (×7): 300 mg via ORAL
  Filled 2017-12-31 (×7): qty 1

## 2017-12-31 MED ORDER — SODIUM CHLORIDE 0.9 % IV SOLN
INTRAVENOUS | Status: DC
  Administered 2017-12-31 – 2018-01-01 (×3): via INTRAVENOUS

## 2017-12-31 MED ORDER — NITROGLYCERIN 0.4 MG SL SUBL: 0.4000 mg | SUBLINGUAL_TABLET | SUBLINGUAL | Status: DC | PRN

## 2017-12-31 MED ORDER — SODIUM CHLORIDE 0.9 % IV SOLN
INTRAVENOUS | Status: DC
  Administered 2017-12-31: 11:00:00 via INTRAVENOUS

## 2017-12-31 MED ORDER — ACETAMINOPHEN 325 MG PO TABS: 650.0000 mg | ORAL_TABLET | Freq: Four times a day (QID) | ORAL | Status: DC | PRN

## 2017-12-31 MED ORDER — AMLODIPINE BESYLATE 10 MG PO TABS
10.0000 mg | ORAL_TABLET | Freq: Every day | ORAL | Status: DC
  Administered 2017-12-31 – 2018-01-02 (×3): 10 mg via ORAL
  Filled 2017-12-31 (×3): qty 1

## 2017-12-31 MED ORDER — PANTOPRAZOLE SODIUM 40 MG PO TBEC
40.0000 mg | DELAYED_RELEASE_TABLET | Freq: Every day | ORAL | Status: DC
  Administered 2017-12-31 – 2018-01-02 (×3): 40 mg via ORAL
  Filled 2017-12-31 (×3): qty 1

## 2017-12-31 MED ORDER — FERROUS SULFATE 325 (65 FE) MG PO TABS
325.0000 mg | ORAL_TABLET | Freq: Every evening | ORAL | Status: DC
  Administered 2017-12-31 – 2018-01-01 (×2): 325 mg via ORAL
  Filled 2017-12-31 (×2): qty 1

## 2017-12-31 MED ORDER — CLOPIDOGREL BISULFATE 75 MG PO TABS
75.0000 mg | ORAL_TABLET | Freq: Every day | ORAL | Status: DC
  Administered 2018-01-01 – 2018-01-02 (×2): 75 mg via ORAL
  Filled 2017-12-31 (×2): qty 1

## 2017-12-31 MED ORDER — LORAZEPAM 2 MG/ML IJ SOLN
0.5000 mg | Freq: Once | INTRAMUSCULAR | Status: AC
  Administered 2017-12-31: 0.5 mg via INTRAVENOUS
  Filled 2017-12-31: qty 1

## 2017-12-31 NOTE — Patient Outreach (Signed)
[facility]) Care Management  12/31/2017  Samuel BusingJohn D [name] 07-Jun-1951 [id number]   Patient was called regarding medication assistance. Unfortunately, he did not answer the phone. HIPAA compliant message was left on his voicemail.  Review of the patient's chart showed he went to the ED today.  Plan: Call patient back within 3 business days.  Beecher McardleKatina J. Quinzell Malcomb, PharmD, BCACP [facility] Clinical Pharmacist [phone]

## 2017-12-31 NOTE — ED Notes (Signed)
ED TO INPATIENT HANDOFF REPORT  Name/Age/Gender Samuel [name] 67 y.o. male  Code Status Code Status History    Date Active Date Inactive Code Status Order ID Comments User Context   09/22/2017 0316 09/24/2017 1501 DNR [id number]  Toy Baker, MD Inpatient   09/22/2017 0316 09/22/2017 0316 DNR [id number]  Toy Baker, MD Inpatient   04/11/2017 1928 04/11/2017 2250 DNR [id number]  Kayleen Memos, DO Inpatient   04/10/2017 1512 04/11/2017 1928 DNR [id number]  Aline August, MD Inpatient   04/07/2017 1824 04/10/2017 1512 Full Code [id number]  Phillips Grout, MD Inpatient   11/19/2016 0050 11/21/2016 1721 Full Code [id number]  Bethena Roys, MD Inpatient   03/08/2015 1533 03/15/2015 1707 Full Code [id number]  Corky Sing, PA-C Inpatient   03/07/2015 0734 03/08/2015 1533 DNR [id number]  Rise Patience, MD Inpatient   01/07/2014 1951 01/11/2014 1833 Full Code [id number]  Wylene Simmer, MD Inpatient   12/21/2013 0229 12/23/2013 1822 Full Code [id number]  Theressa Millard, MD Inpatient    Questions for Most Recent Historical Code Status (Order [id number])    Question Answer Comment   In the event of cardiac or respiratory ARREST Do not call a "code blue"    In the event of cardiac or respiratory ARREST Do not perform Intubation, CPR, defibrillation or ACLS    In the event of cardiac or respiratory ARREST Use medication by any route, position, wound care, and other measures to relive pain and suffering. May use oxygen, suction and manual treatment of airway obstruction as needed for comfort.         Advance Directive Documentation     Most Recent Value  Type of Advance Directive  Healthcare Power of Attorney  Pre-existing out of facility DNR order (yellow form or pink MOST form)  -  "MOST" Form in Place?  -      Home/SNF/Other Home  Chief Complaint N/V  Level of Care/Admitting Diagnosis ED Disposition    ED Disposition Condition [place]: [place] [[id number]]  Level of Care: Med-Surg [16]  Diagnosis: Intractable nausea and vomiting [035009]  Admitting Physician: Cristal Ford [[id number]]  Attending Physician: Cristal Ford [phone]  Estimated length of stay: past midnight tomorrow  Certification:: I certify this patient will need inpatient services for at least 2 midnights  PT Class (Do Not Modify): Inpatient [101]  PT Acc Code (Do Not Modify): Private [1]       Medical History Past Medical History:  Diagnosis Date  . Anxiety   . Arthritis    "all over"   . CAD (coronary artery disease)   . Charcot's joint    "left foot"  . 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 Depression   . Gastroparesis   . GERD (gastroesophageal reflux disease)   . H/O hiatal hernia   . Hyperlipidemia   . Hypertension   . Myocardial infarction ([place]) 2017/03/27   around this date  . OSA (obstructive sleep apnea)    "not bad enough for a mask"  . Peripheral neuropathy   . Peripheral vascular disease ([place])   . PONV (postoperative nausea and vomiting)   . Pulmonary embolism ([place])    hx. of 2012  . Shortness of breath    exertion  . Type II diabetes mellitus (HCC)     Allergies Allergies  Allergen Reactions  . Gabapentin Hives, Itching and Rash    REACTION: rash/hives (per patient, was  a reaction to an inactive ingredient in another mgf brand). The patient stated that he does take this medication now and it doesn't cause a rash any more.  . Nabumetone Itching, Nausea Only and Rash  . Reglan [Metoclopramide] Hives and Nausea Only  . Codeine Other (See Comments)    GI UPSET - hives    IV Location/Drains/Wounds Patient Lines/Drains/Airways Status   Active Line/Drains/Airways    Name:   Placement date:   Placement time:   Site:   Days:   Peripheral IV 12/31/17 Left Wrist   12/31/17    1056    Wrist   less than 1          Labs/Imaging Results for orders placed or performed during the hospital  encounter of 12/31/17 (from the past 48 hour(s))  CBC with Differential/Platelet     Status: None   Collection Time: 12/31/17 11:23 AM  Result Value Ref Range   WBC 8.0 4.0 - 10.5 K/uL   RBC 4.38 4.22 - 5.81 MIL/uL   Hemoglobin 13.7 13.0 - 17.0 g/dL   HCT 39.9 39.0 - 52.0 %   MCV 91.1 78.0 - 100.0 fL   MCH 31.3 26.0 - 34.0 pg   MCHC 34.3 30.0 - 36.0 g/dL   RDW 12.3 11.5 - 15.5 %   Platelets 265 150 - 400 K/uL   Neutrophils Relative % 84 %   Neutro Abs 6.8 1.7 - 7.7 K/uL   Lymphocytes Relative 11 %   Lymphs Abs 0.8 0.7 - 4.0 K/uL   Monocytes Relative 4 %   Monocytes Absolute 0.3 0.1 - 1.0 K/uL   Eosinophils Relative 1 %   Eosinophils Absolute 0.0 0.0 - 0.7 K/uL   Basophils Relative 0 %   Basophils Absolute 0.0 0.0 - 0.1 K/uL    Comment: Performed at [facility], [place] [address], [place], [place] [address]  Comprehensive metabolic panel     Status: Abnormal   Collection Time: 12/31/17 11:23 AM  Result Value Ref Range   Sodium 138 135 - 145 mmol/L   Potassium 4.3 3.5 - 5.1 mmol/L   Chloride 100 98 - 111 mmol/L    Comment: Please note change in reference range.   CO2 24 22 - 32 mmol/L   Glucose, Bld 314 (H) 70 - 99 mg/dL    Comment: Please note change in reference range.   BUN 20 8 - 23 mg/dL    Comment: Please note change in reference range.   Creatinine, Ser 0.99 0.61 - 1.24 mg/dL   Calcium 8.8 (L) 8.9 - 10.3 mg/dL   Total Protein 7.0 6.5 - 8.1 g/dL   Albumin 3.4 (L) 3.5 - 5.0 g/dL   AST 20 15 - 41 U/L   ALT 16 0 - 44 U/L    Comment: Please note change in reference range.   Alkaline Phosphatase 87 38 - 126 U/L   Total Bilirubin 1.0 0.3 - 1.2 mg/dL   GFR calc non Af Amer >60 >60 mL/min   GFR calc Af Amer >60 >60 mL/min    Comment: (NOTE) The eGFR has been calculated using the CKD EPI equation. This calculation has not been validated in all clinical situations. eGFR's persistently <60 mL/min signify possible Chronic Kidney Disease.    Anion gap 14 5  - 15    Comment: Performed at [facility], [place] [address], [place], [place] [address]  Lipase, blood     Status: None   Collection Time:  12/31/17 11:23 AM  Result Value Ref Range   Lipase 17 11 - 51 U/L    Comment: Performed at [facility], [place] [address] [place], [place] [address]  Urinalysis, Routine w reflex microscopic     Status: Abnormal   Collection Time: 12/31/17 11:23 AM  Result Value Ref Range   Color, Urine YELLOW YELLOW   APPearance CLEAR CLEAR   Specific Gravity, Urine 1.025 1.005 - 1.030   pH 6.0 5.0 - 8.0   Glucose, UA >=500 (A) NEGATIVE mg/dL   Hgb urine dipstick SMALL (A) NEGATIVE   Bilirubin Urine NEGATIVE NEGATIVE   Ketones, ur 80 (A) NEGATIVE mg/dL   Protein, ur 30 (A) NEGATIVE mg/dL   Nitrite POSITIVE (A) NEGATIVE   Leukocytes, UA SMALL (A) NEGATIVE   RBC / HPF 0-5 0 - 5 RBC/hpf   WBC, UA >50 (H) 0 - 5 WBC/hpf   Bacteria, UA NONE SEEN NONE SEEN   Squamous Epithelial / LPF 0-5 0 - 5    Comment: Performed at [facility], [place] [address], [place], [place] [address]   Dg Abd Acute W/chest  Result Date: 12/31/2017 CLINICAL DATA:  Severe mid abdominal pain, nausea EXAM: DG ABDOMEN ACUTE W/ 1V CHEST COMPARISON:  Chest x-ray 12/02/2017 FINDINGS: Bibasilar atelectasis. Mild cardiomegaly. No overt edema. No effusions. Prior cholecystectomy. IVC filter in place. No bowel obstruction or free air. IMPRESSION: Prior cholecystectomy. No evidence of bowel obstruction or free air. Cardiomegaly.  Bibasilar atelectasis. Electronically Signed   By: Rolm Baptise M.D.   On: 12/31/2017 11:08    Pending Labs Unresulted Labs (From admission, onward)   Start     Ordered   12/31/17 1302  Culture, Urine  Once,   R     12/31/17 1301   Signed and Held  HIV antibody (Routine Testing)  Once,   R     Signed and Held   Signed and Held  Basic metabolic panel  Tomorrow morning,   R     Signed and Held   Signed and Held  CBC   Tomorrow morning,   R     Signed and Held      Vitals/Pain Today's Vitals   12/31/17 1134 12/31/17 1142 12/31/17 1200 12/31/17 1250  BP:    (!) 127/56  Pulse:   88 92  Resp:    16  Temp:      TempSrc:      SpO2:   99% 96%  Weight: (!) 310 lb (140.6 kg)     Height: 6' 4"  (1.93 m)     PainSc:  0-No pain      Isolation Precautions No active isolations  Medications Medications  0.9 %  sodium chloride infusion ( Intravenous Stopped 12/31/17 1252)  cefTRIAXone (ROCEPHIN) 1 g in sodium chloride 0.9 % 100 mL IVPB (1 g Intravenous New Bag/Given 12/31/17 1248)  sodium chloride 0.9 % bolus 2,000 mL (2,000 mLs Intravenous New Bag/Given 12/31/17 1105)  promethazine (PHENERGAN) injection 25 mg (25 mg Intravenous Given 12/31/17 1105)  LORazepam (ATIVAN) injection 0.5 mg (0.5 mg Intravenous Given 12/31/17 1133)    Mobility walks with person assist

## 2017-12-31 NOTE — ED Provider Notes (Signed)
[facility]-EMERGENCY DEPT Provider Note   CSN: [id number] Arrival date & time: 12/31/17  0954     History   Chief Complaint Chief Complaint  Patient presents with  . Nausea  . Emesis    HPI Samuel [name] is a 67 y.o. male.  66 year old male with history of gastroparesis presents with 24-hour history of emesis characterized as green and yellow.  Denies any fever or chills.  No diarrhea noted.  No urinary symptoms.  Has not had any abdominal pain or abdominal distention.  Current symptoms are similar to his gastroparesis.  Has been home medicating without relief.  Is unsure of what facilitated his current attack.  Nothing makes it better.     Past Medical History:  Diagnosis Date  . Anxiety   . Arthritis    "all over"   . CAD (coronary artery disease)   . Charcot's joint    "left foot"  . 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 Depression   . Gastroparesis   . GERD (gastroesophageal reflux disease)   . H/O hiatal hernia   . Hyperlipidemia   . Hypertension   . Myocardial infarction (HCC) 2017/03/27   around this date  . OSA (obstructive sleep apnea)    "not bad enough for a mask"  . Peripheral neuropathy   . Peripheral vascular disease (HCC)   . PONV (postoperative nausea and vomiting)   . Pulmonary embolism (HCC)    hx. of 2012  . Shortness of breath    exertion  . Type II diabetes mellitus [facility])     Patient Active Problem List   Diagnosis Date Noted  . Uncontrolled diabetes mellitus (HCC) 09/23/2017  . DKA, type 2, not at goal [facility]) 09/22/2017  . S/P BKA (below knee amputation) (HCC) 04/19/2017  . COPD (chronic obstructive pulmonary disease) (HCC) 04/19/2017  . NSTEMI (non-ST elevated myocardial infarction) (HCC) 04/07/2017  . Osteomyelitis of foot (HCC)   . Protein-calorie malnutrition, severe (HCC) 03/08/2015  . Osteomyelitis of foot, acute (HCC) 03/07/2015  . Sepsis (HCC) 03/07/2015  . Diabetes mellitus type  2, uncontrolled (HCC) 03/07/2015  . Normocytic normochromic anemia 03/07/2015  . Obesity (BMI 30-39.9) 03/07/2015  . Nausea & vomiting   . DM (diabetes mellitus), type 2 (HCC) 01/07/2014  . Chronic osteomyelitis involving lower leg (HCC) 01/07/2014  . Vomiting 12/22/2013  . Gastroparesis 12/21/2013  . Intractable nausea and vomiting 12/21/2013  . Essential hypertension 05/01/2013  . Hyperlipidemia due to type 2 diabetes mellitus (HCC) 05/01/2013  . Generalized weakness 03/20/2013  . Back pain 03/20/2013  . Pulmonary embolism (HCC) 03/27/2011  . PERFORATION OF GALLBLADDER 08/30/2010  . Ulcer of lower limb, unspecified 03/01/2010  . METHICILLIN SUSCEPTIBLE STAPH AUREUS SEPTICEMIA 01/25/2010  . Acute osteomyelitis, ankle and foot 01/25/2010  . NAUSEA AND VOMITING 01/25/2010  . GERD 11/09/2009  . CAD S/P DES PCI to mLAD: Xience DES 2.75 x 15 (3.0 mm) 05/01/2009  . DIABETES, TYPE 1 05/14/2008  . OBSTRUCTIVE SLEEP APNEA 04/30/2008  . ALLERGIC RHINITIS, SEASONAL 04/30/2008    Past Surgical History:  Procedure Laterality Date  . AMPUTATION Left 03/08/2015   Procedure:  LEFT AMPUTATION BELOW KNEE;  Surgeon: Toni Arthurs, MD;  Location: [facility] ORS;  Service: Orthopedics;  Laterality: Left;  . APPLICATION OF WOUND VAC Left 01/07/2014  . CHOLECYSTECTOMY  06/2010  . CORONARY ANGIOPLASTY WITH STENT PLACEMENT  05/2009   "1"  . FOOT SURGERY Left 2010   "for Charcot's joint"  .  HARDWARE REMOVAL Left 01/07/2014   Procedure: LEFT LEG REMOVAL OF DEEP IMPLANT AND SEQUESTRECTOMY; APPLICATION OF WOUND VAC ;  Surgeon: Toni ArthursJohn Hewitt, MD;  Location: [facility] OR;  Service: Orthopedics;  Laterality: Left;  . I&D EXTREMITY Left "multiple"   leg  . I&D EXTREMITY Left 01/07/2014   Procedure: 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;  Surgeon: Toni ArthursJohn Hewitt, MD;  Location: [facility] OR;  Service: Orthopedics;  Laterality: Left;  . IM NAILING TIBIA Left ~ 2012  . LEFT HEART CATH AND CORONARY ANGIOGRAPHY N/A 04/08/2017    Procedure: LEFT HEART CATH AND CORONARY ANGIOGRAPHY;  Surgeon: Runell GessBerry, Jonathan J, MD;  Location: [facility] INVASIVE CV LAB;  Service: Cardiovascular;  Laterality: N/A;  . SHOULDER ARTHROSCOPY W/ ROTATOR CUFF REPAIR Left    "and bone spurs"  . TIBIAL IM ROD REMOVAL Left 01/07/2014  . TOE AMPUTATION Right ~ 2011   "great toe"  . VENA CAVA FILTER PLACEMENT  2012  . VENA CAVA FILTER PLACEMENT N/A 11/20/2016   Procedure: INSERTION VENA-CAVA FILTER;  Surgeon: Sherren KernsFields, Charles E, MD;  [facility];  Service: Vascular;  Laterality: N/A;  . WOUND DEBRIDEMENT Left 01/07/2014   "tibia"        Home Medications    Prior to Admission medications   Medication Sig Start Date End Date Taking? Authorizing Provider  acarbose (PRECOSE) 50 MG tablet Take 50 mg 3 (three) times daily with meals by mouth.    Pearson GrippeKim, James, MD  amLODipine (NORVASC) 10 MG tablet Take 1 tablet (10 mg total) by mouth daily. 11/22/16   Richarda OverlieAbrol, Nayana, MD  apixaban (ELIQUIS) 5 MG TABS tablet Take 1 tablet (5 mg total) by mouth 2 (two) times daily. 09/24/17   Osvaldo ShipperKrishnan, Gokul, MD  aspirin 81 MG EC tablet Take 1 tablet (81 mg total) by mouth daily. 04/12/17   Glade LloydAlekh, Kshitiz, MD  atorvastatin (LIPITOR) 80 MG tablet Take 1 tablet (80 mg total) by mouth daily at 6 PM. 04/11/17   Glade LloydAlekh, Kshitiz, MD  clopidogrel (PLAVIX) 75 MG tablet Take 1 tablet (75 mg total) by mouth daily. 04/12/17   Glade LloydAlekh, Kshitiz, MD  docusate sodium (COLACE) 100 MG capsule Take 100 mg by mouth daily as needed (for constipation).     [provider]  ferrous sulfate 325 (65 FE) MG tablet Take 325 mg by mouth every evening.     [provider]  furosemide (LASIX) 20 MG tablet Take 1 tablet (20 mg total) by mouth daily. 04/12/17   Glade LloydAlekh, Kshitiz, MD  gabapentin (NEURONTIN) 300 MG capsule Take 300 mg by mouth 3 (three) times daily.    [provider]  ibuprofen (ADVIL,MOTRIN) 200 MG tablet Take 400 mg every 6 (six) hours as needed by mouth for headache or pain.     [provider]  insulin detemir (LEVEMIR) 100 UNIT/ML injection Inject 0.25 mLs (25 Units total) into the skin 2 (two) times daily. Patient taking differently: Inject 50 Units into the skin 2 (two) times daily.  09/24/17   Osvaldo ShipperKrishnan, Gokul, MD  insulin lispro (HUMALOG) 100 UNIT/ML injection Inject 0-10 Units into the skin 3 (three) times daily with meals. Inject SQ per sliding scale 150-250 (5 units), 251-350(8 units), 351-450(10 units), >450=10 units    [provider]  lansoprazole (PREVACID) 30 MG capsule Take 30 mg by mouth 2 (two) times daily. 03/19/17   [provider]  metoCLOPramide (REGLAN) 5 MG tablet Take 2 tablets (10 mg total) by mouth 4 (four) times daily -  before meals and at bedtime. 09/24/17   Osvaldo Shipper, MD  metoprolol tartrate (LOPRESSOR) 25 MG tablet Take 1 tablet (25 mg total) by mouth 2 (two) times daily. 04/11/17   Glade Lloyd, MD  nitroGLYCERIN (NITROSTAT) 0.4 MG SL tablet Place 1 tablet (0.4 mg total) under the tongue every 5 (five) minutes as needed for chest pain. 04/11/17   Glade Lloyd, MD  [facility] VERIO test strip Use to test blood sugar twice daily 09/24/17   [provider]  polyethylene glycol (MIRALAX / GLYCOLAX) packet Take 17 g by mouth daily as needed for moderate constipation. Patient not taking: Reported on 11/22/2017 04/11/17   Glade Lloyd, MD  promethazine (PHENERGAN) 25 MG tablet Take 25 mg by mouth every 8 (eight) hours as needed for nausea or vomiting.     [provider]  ranitidine (ZANTAC) 150 MG tablet Take 150 mg by mouth 2 (two) times daily.     [provider]  ranolazine (RANEXA) 500 MG 12 hr tablet Take 1 tablet (500 mg total) by mouth 2 (two) times daily. 04/11/17   Glade Lloyd, MD  traMADol (ULTRAM) 50 MG tablet Take 100 mg by mouth every 12 (twelve) hours as needed (pain).    [provider]    Family History Family History  Problem Relation Age of Onset  . COPD  Mother   . Heart disease Mother   . Lung cancer Mother   . Retinal detachment Father   . Alcoholism Brother   . Heart disease Brother   . COPD Brother   . Diabetes Brother   . Hypertension Brother   . Stroke Brother     Social History Social History   Tobacco Use  . Smoking status: Never Smoker  . Smokeless tobacco: Never Used  Substance Use Topics  . Alcohol use: No    Frequency: Never    Comment: 01/07/2014 'might have a wine cooler a couple times/yr"  . Drug use: No     Allergies   Gabapentin; Nabumetone; and Codeine   Review of Systems Review of Systems  All other systems reviewed and are negative.    Physical Exam Updated Vital Signs BP (!) 157/77 (BP Location: Right Arm)   Pulse 83   Temp 97.6 F (36.4 C) (Oral)   Resp 19   SpO2 94%   Physical Exam  Constitutional: He is oriented to person, place, and time. He appears well-developed and well-nourished.  Non-toxic appearance. No distress.  HENT:  Head: Normocephalic and atraumatic.  Eyes: Pupils are equal, round, and reactive to light. Conjunctivae, EOM and lids are normal.  Neck: Normal range of motion. Neck supple. No tracheal deviation present. No thyroid mass present.  Cardiovascular: Normal rate, regular rhythm and normal heart sounds. Exam reveals no gallop.  No murmur heard. Pulmonary/Chest: Effort normal and breath sounds normal. No stridor. No respiratory distress. He has no decreased breath sounds. He has no wheezes. He has no rhonchi. He has no rales.  Abdominal: Soft. Normal appearance and bowel sounds are normal. He exhibits no distension. There is no tenderness. There is no rigidity, no rebound, no guarding and no CVA tenderness.  Musculoskeletal: Normal range of motion. He exhibits no edema or tenderness.  Neurological: He is alert and oriented to person, place, and time. He has normal strength. No cranial nerve deficit or sensory deficit. GCS eye subscore is 4. GCS verbal subscore is 5. GCS  motor subscore is 6.  Skin: Skin is warm and dry. No  abrasion and no rash noted.  Psychiatric: He has a normal mood and affect. His speech is normal and behavior is normal.  Nursing note and vitals reviewed.    ED Treatments / Results  Labs (all labs ordered are listed, but only abnormal results are displayed) Labs Reviewed  CBC WITH DIFFERENTIAL/PLATELET  COMPREHENSIVE METABOLIC PANEL  LIPASE, BLOOD  URINALYSIS, ROUTINE W REFLEX MICROSCOPIC    EKG None  Radiology No results found.  Procedures Procedures (including critical care time)  Medications Ordered in ED Medications  sodium chloride 0.9 % bolus 2,000 mL (has no administration in time range)  0.9 %  sodium chloride infusion (has no administration in time range)  promethazine (PHENERGAN) injection 25 mg (has no administration in time range)  LORazepam (ATIVAN) injection 0.5 mg (has no administration in time range)     Initial Impression / Assessment and Plan / ED Course  I have reviewed the triage vital signs and the nursing notes.  Pertinent labs & imaging results that were available during my care of the patient were reviewed by me and considered in my medical decision making (see chart for details).    Patient given IV fluids here as well as antiemetics.  Urinalysis positive for infection.  Also shows patient be dehydrated.  Patient started on IV Rocephin.  He has mild hyperglycemia that will be treated with IV fluids.  Acute abdominal series shows no signs of obstruction.  Will be admitted by the hospitalist service.  Final Clinical Impressions(s) / ED Diagnoses   Final diagnoses:  None    ED Discharge Orders    None       Lorre Nick, MD 12/31/17 1240

## 2017-12-31 NOTE — Consult Note (Signed)
WOC Nurse wound consult note Assessment performed [facility] 1621. Friend present. Reason for Consult: RLE wounds Wound type:Superficial abrasions from patient "bumping it on the wheelchair" per patient. Injury POA: Yes Two very small, scabbed ares present along the RLE shin.  No s/s of infection, no drainage, no odor. Plan:  Foam dressing, change every 3 days and prn. Monitor the wound area(s) for worsening of condition such as: Signs/symptoms of infection,  Increase in size,  Development of or worsening of odor, Development of pain, or increased pain at the affected locations.  Notify the medical team if any of these develop.  Thank you for the consult.  Discussed plan of care with the patient and bedside nurse.  WOC nurse will not follow at this time.  Please re-consult the WOC team if needed.  Helmut MusterSherry Maui Britten, RN, MSN, CWOCN, CNS-BC, pager [phone]

## 2017-12-31 NOTE — ED Notes (Signed)
Pt A&O x4. O2 sat was 77% on room air. Administered 4LPM of O2 via [place]. Pt O2 level improved to 94%. Non-labored breathing. Pt cousin at bedside.

## 2017-12-31 NOTE — ED Notes (Signed)
Bed: WA03 Expected date:  Expected time:  Means of arrival:  Comments: Ems n/v

## 2017-12-31 NOTE — H&P (Signed)
[facility] History and Physical  Samuel BusingJohn D [name] ZOX:[id number] DOB: January 10, 1951 DOA: 12/31/2017  PCP: Pearson GrippeKim, James, MD  Patient coming from: Home  Chief Complaint: Nausea and vomiting  HPI: Samuel BusingJohn D [name] is a 67 y.o. male with a medical history of chronic diastolic heart failure, coronary artery disease, diabetes mellitus, type II, hypertension, who presented to the emergency department with complaints of nausea and vomiting.  Patient states he has a history of gastroparesis.  He also states he has Reglan at home however this is not listed on his med rec.  States he has not been able to eat or drink anything in the past several days has had some abdominal soreness.  Complains of increased urinary frequency without dysuria.  He denies any fever or chills but does state that he stays cold.  Currently denies chest pain, shortness of breath, diarrhea or constipation, dizziness or headache, recent illness or sick contacts or travel.  ED Course: Found to have urinary tract infection, started on ceftriaxone.  Abdominal x-ray obtained for vomiting, unremarkable.  TRH called for admission.  Review of Systems:  All other systems reviewed and are negative.   Past Medical History:  Diagnosis Date  . Anxiety   . Arthritis    "all over"   . CAD (coronary artery disease)   . Charcot's joint    "left foot"  . 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 Depression   . Gastroparesis   . GERD (gastroesophageal reflux disease)   . H/O hiatal hernia   . Hyperlipidemia   . Hypertension   . Myocardial infarction (HCC) 2017/03/27   around this date  . OSA (obstructive sleep apnea)    "not bad enough for a mask"  . Peripheral neuropathy   . Peripheral vascular disease (HCC)   . PONV (postoperative nausea and vomiting)   . Pulmonary embolism (HCC)    hx. of 2012  . Shortness of breath    exertion  . Type II diabetes mellitus (HCC)     Past Surgical History:  Procedure  Laterality Date  . AMPUTATION Left 03/08/2015   Procedure:  LEFT AMPUTATION BELOW KNEE;  Surgeon: Toni ArthursJohn Hewitt, MD;  Location: [facility];  Service: Orthopedics;  Laterality: Left;  . APPLICATION OF WOUND VAC Left 01/07/2014  . CHOLECYSTECTOMY  06/2010  . CORONARY ANGIOPLASTY WITH STENT PLACEMENT  05/2009   "1"  . FOOT SURGERY Left 2010   "for Charcot's joint"  . HARDWARE REMOVAL Left 01/07/2014   Procedure: LEFT LEG REMOVAL OF DEEP IMPLANT AND SEQUESTRECTOMY; APPLICATION OF WOUND VAC ;  Surgeon: Toni ArthursJohn Hewitt, MD;  Location: [facility] OR;  Service: Orthopedics;  Laterality: Left;  . I&D EXTREMITY Left "multiple"   leg  . I&D EXTREMITY Left 01/07/2014   Procedure: 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;  Surgeon: Toni ArthursJohn Hewitt, MD;  Location: [facility] OR;  Service: Orthopedics;  Laterality: Left;  . IM NAILING TIBIA Left ~ 2012  . LEFT HEART CATH AND CORONARY ANGIOGRAPHY N/A 04/08/2017   Procedure: LEFT HEART CATH AND CORONARY ANGIOGRAPHY;  Surgeon: Runell GessBerry, Jonathan J, MD;  Location: [facility] INVASIVE CV [facility];  Service: Cardiovascular;  Laterality: N/A;  . SHOULDER ARTHROSCOPY W/ ROTATOR CUFF REPAIR Left    "and bone spurs"  . TIBIAL IM ROD REMOVAL Left 01/07/2014  . TOE AMPUTATION Right ~ 2011   "great toe"  . VENA CAVA FILTER PLACEMENT  2012  . VENA CAVA FILTER PLACEMENT N/A 11/20/2016   Procedure: INSERTION VENA-CAVA  FILTER;  Surgeon: Sherren Kerns, MD;  Location: [facility] OR;  Service: Vascular;  Laterality: N/A;  . WOUND DEBRIDEMENT Left 01/07/2014   "tibia"    Social History:  reports that he has never smoked. He has never used smokeless tobacco. He reports that he does not drink alcohol or use drugs.  Allergies  Allergen Reactions  . Gabapentin Hives, Itching and Rash    REACTION: rash/hives (per patient, was a reaction to an inactive ingredient in another mgf brand). The patient stated that he does take this medication now and it doesn't cause a rash any more.  . Nabumetone Itching, Nausea Only and  Rash  . Reglan [Metoclopramide] Hives and Nausea Only  . Codeine Other (See Comments)    GI UPSET - hives    Family History  Problem Relation Age of Onset  . COPD Mother   . Heart disease Mother   . Lung cancer Mother   . Retinal detachment Father   . Alcoholism Brother   . Heart disease Brother   . COPD Brother   . Diabetes Brother   . Hypertension Brother   . Stroke Brother     Prior to Admission medications   Medication Sig Start Date End Date Taking? Authorizing Provider  amLODipine (NORVASC) 10 MG tablet Take 1 tablet (10 mg total) by mouth daily. 11/22/16  Yes Richarda Overlie, MD  apixaban (ELIQUIS) 5 MG TABS tablet Take 1 tablet (5 mg total) by mouth 2 (two) times daily. 09/24/17  Yes Osvaldo Shipper, MD  aspirin 81 MG EC tablet Take 1 tablet (81 mg total) by mouth daily. 04/12/17  Yes Glade Lloyd, MD  ferrous sulfate 325 (65 FE) MG tablet Take 325 mg by mouth every evening.    Yes [provider]  furosemide (LASIX) 20 MG tablet Take 1 tablet (20 mg total) by mouth daily. Patient taking differently: Take 40 mg by mouth daily.  04/12/17  Yes Glade Lloyd, MD  gabapentin (NEURONTIN) 300 MG capsule Take 300 mg by mouth 4 (four) times daily.    Yes [provider]  lansoprazole (PREVACID) 30 MG capsule Take 30 mg by mouth 2 (two) times daily. 03/19/17  Yes [provider]  metoprolol tartrate (LOPRESSOR) 25 MG tablet Take 1 tablet (25 mg total) by mouth 2 (two) times daily. 04/11/17  Yes Glade Lloyd, MD  [facility] VERIO test strip Use to test blood sugar twice daily 09/24/17  Yes [provider]  promethazine (PHENERGAN) 25 MG tablet Take 25-50 mg by mouth every 4 (four) hours as needed for nausea or vomiting.    Yes [provider]  ranitidine (ZANTAC) 150 MG tablet Take 150 mg by mouth 2 (two) times daily.    Yes [provider]  ranolazine (RANEXA) 500 MG 12 hr tablet Take 1 tablet (500 mg total) by mouth 2 (two) times daily.  04/11/17  Yes Glade Lloyd, MD  traMADol (ULTRAM) 50 MG tablet Take 100 mg by mouth every 6 (six) hours as needed for moderate pain.    Yes [provider]  atorvastatin (LIPITOR) 80 MG tablet Take 1 tablet (80 mg total) by mouth daily at 6 PM. 04/11/17   Glade Lloyd, MD  clopidogrel (PLAVIX) 75 MG tablet Take 1 tablet (75 mg total) by mouth daily. 04/12/17   Glade Lloyd, MD  insulin detemir (LEVEMIR) 100 UNIT/ML injection Inject 0.25 mLs (25 Units total) into the skin 2 (two) times daily. Patient taking differently: Inject 85 Units into the skin  2 (two) times daily.  09/24/17   Osvaldo Shipper, MD  insulin lispro (HUMALOG) 100 UNIT/ML injection Inject 0-10 Units into the skin 3 (three) times daily with meals. Inject SQ per sliding scale 150-250 (5 units), 251-350(8 units), 351-450(10 units), >450=10 units    [provider]  nitroGLYCERIN (NITROSTAT) 0.4 MG SL tablet Place 1 tablet (0.4 mg total) under the tongue every 5 (five) minutes as needed for chest pain. 04/11/17   Glade Lloyd, MD    Physical Exam: Vitals:   12/31/17 1100 12/31/17 1200  BP: (!) 125/47   Pulse: 83 88  Resp:    Temp:    SpO2: 96% 99%     General: Well developed, chronically ill appearing, NAD  HEENT: NCAT, PERRLA, EOMI, Anicteic Sclera, mucous membranes moist.   Neck: Supple, no JVD, no masses  Cardiovascular: S1 S2 auscultated, no rubs, murmurs or gallops. Regular rate and rhythm.  Respiratory: Clear to auscultation bilaterally with equal chest rise  Abdomen: Soft, obese, diffusely TTP, nondistended, + bowel sounds  Extremities: warm dry without cyanosis clubbing or edema or RLE. L BKA  Neuro: AAOx3, cranial nerves grossly intact. Strength 5/5 in patient's upper and lower extremities bilaterally  Skin: Without rashes exudates or nodules, healing wound on RLE  Psych: Normal affect and demeanor with intact judgement and insight  Labs on Admission: I have personally reviewed  following labs and imaging studies CBC: Recent Labs  Lab 12/31/17 1123  WBC 8.0  NEUTROABS 6.8  HGB 13.7  HCT 39.9  MCV 91.1  PLT 265   Basic Metabolic Panel: Recent Labs  Lab 12/31/17 1123  NA 138  K 4.3  CL 100  CO2 24  GLUCOSE 314*  BUN 20  CREATININE 0.99  CALCIUM 8.8*   GFR: Estimated Creatinine Clearance: 115.5 mL/min (by C-G formula based on SCr of 0.99 mg/dL). Liver Function Tests: Recent Labs  Lab 12/31/17 1123  AST 20  ALT 16  ALKPHOS 87  BILITOT 1.0  PROT 7.0  ALBUMIN 3.4*   Recent Labs  Lab 12/31/17 1123  LIPASE 17   No results for input(s): AMMONIA in the last 168 hours. Coagulation Profile: No results for input(s): INR, PROTIME in the last 168 hours. Cardiac Enzymes: No results for input(s): CKTOTAL, CKMB, CKMBINDEX, TROPONINI in the last 168 hours. BNP (last 3 results) No results for input(s): PROBNP in the last 8760 hours. HbA1C: No results for input(s): HGBA1C in the last 72 hours. CBG: No results for input(s): GLUCAP in the last 168 hours. Lipid Profile: No results for input(s): CHOL, HDL, LDLCALC, TRIG, CHOLHDL, LDLDIRECT in the last 72 hours. Thyroid Function Tests: No results for input(s): TSH, T4TOTAL, FREET4, T3FREE, THYROIDAB in the last 72 hours. Anemia Panel: No results for input(s): VITAMINB12, FOLATE, FERRITIN, TIBC, IRON, RETICCTPCT in the last 72 hours. Urine analysis:    Component Value Date/Time   COLORURINE YELLOW 12/31/2017 1123   APPEARANCEUR CLEAR 12/31/2017 1123   LABSPEC 1.025 12/31/2017 1123   PHURINE 6.0 12/31/2017 1123   GLUCOSEU >=500 (A) 12/31/2017 1123   HGBUR SMALL (A) 12/31/2017 1123   BILIRUBINUR NEGATIVE 12/31/2017 1123   KETONESUR 80 (A) 12/31/2017 1123   PROTEINUR 30 (A) 12/31/2017 1123   UROBILINOGEN 1.0 04/05/2015 1155   NITRITE POSITIVE (A) 12/31/2017 1123   LEUKOCYTESUR SMALL (A) 12/31/2017 1123   Sepsis Labs: @LABRCNTIP (procalcitonin:4,lacticidven:4) )No results found for this or any  previous visit (from the past 240 hour(s)).   Radiological Exams on Admission: Dg Abd Acute W/chest  Result Date: 12/31/2017 CLINICAL DATA:  Severe mid abdominal pain, nausea EXAM: DG ABDOMEN ACUTE W/ 1V CHEST COMPARISON:  Chest x-ray 12/02/2017 FINDINGS: Bibasilar atelectasis. Mild cardiomegaly. No overt edema. No effusions. Prior cholecystectomy. IVC filter in place. No bowel obstruction or free air. IMPRESSION: Prior cholecystectomy. No evidence of bowel obstruction or free air. Cardiomegaly.  Bibasilar atelectasis. Electronically Signed   By: Charlett Nose M.D.   On: 12/31/2017 11:08    EKG: Independently reviewed.  Sinus rhythm, rate 95 (unchanged from prior)  Assessment/Plan Intractable nausea and vomiting  -likely secondary to gastroparesis vs UTI -will place on IVF, antiemetics, reglan, clear liquids (at patient's request) -Of note, patient states that he has Reglan at home however this was not noted on his med rec -Abdominal x-ray unremarkable  Urinary tract infection -Patient afebrile without leukocytosis -UA positive nitrites, small leukocytes, WBC >50 -Will obtain urine culture as one was not sent from the ED prior to start of antibiotics -Continue ceftriaxone  Hypoxia -Patient noted to be hypoxic on room air, 77%.  Currently on 3 L nasal cannula -Will attempt to wean as possible -Chest x-ray unremarkable  Ketonuria -Suspect nausea and vomiting -Treatment and plan as above  Diabetes mellitus, type II -Continue Levemir, at reduced dose 40 mg twice daily along with insulin sliding scale and CBG monitoring -Last hemoglobin A1c was 12.4 on 09/22/2017  Essential hypertension -Continue amlodipine, metoprolol -Hold Lasix  Chronic diastolic heart failure -Currently appears to be euvolemic and compensated -Echocardiogram 11/19/2016 shows an EF of 60 to 65%, grade 1 diastolic dysfunction -hold Lasix -Monitor intake and output, daily weights  Coronary artery  disease -Currently chest pain-free, continue statin, metoprolol, Plavix  History of PE -Continue Eliquis  DVT prophylaxis: Eliquis  Code Status: DNR (confirmed with patient)  Family Communication: None at bedside. Admission, patients condition and plan of care including tests being ordered have been discussed with the patient, who indicates understanding and agrees with the plan and Code Status.  Disposition Plan: Home   Consults called: None   Admission status: Admitted, suspect patient will need several days of IVF and medications for treatment of his N/V   Time spent: 70 minutes  Edahi Kroening D.O. [facility] Pager [phone]  If 7PM-7AM, please contact night-coverage [email or web address] Password [facility] 12/31/2017, 12:57 PM

## 2017-12-31 NOTE — ED Triage Notes (Signed)
Per EMS: Pt coming from home with c/o gastroparesis. Pt has a hx of the same. Pt has dry heaves with EMS. Pt given 2 doses of zofran with EMS.

## 2018-01-01 DIAGNOSIS — R112 Nausea with vomiting, unspecified: Secondary | ICD-10-CM

## 2018-01-01 LAB — GLUCOSE, CAPILLARY
GLUCOSE-CAPILLARY: 178 mg/dL — AB (ref 70–99)
GLUCOSE-CAPILLARY: 198 mg/dL — AB (ref 70–99)
Glucose-Capillary: 234 mg/dL — ABNORMAL HIGH (ref 70–99)
Glucose-Capillary: 196 mg/dL — ABNORMAL HIGH (ref 70–99)

## 2018-01-01 LAB — CBC
HCT: 38 % — ABNORMAL LOW (ref 39.0–52.0)
Hemoglobin: 12.9 g/dL — ABNORMAL LOW (ref 13.0–17.0)
MCH: 31.1 pg (ref 26.0–34.0)
MCHC: 33.9 g/dL (ref 30.0–36.0)
MCV: 91.6 fL (ref 78.0–100.0)
Platelets: 240 10*3/uL (ref 150–400)
RBC: 4.15 MIL/uL — ABNORMAL LOW (ref 4.22–5.81)
RDW: 12.5 % (ref 11.5–15.5)
WBC: 6.9 10*3/uL (ref 4.0–10.5)

## 2018-01-01 LAB — BASIC METABOLIC PANEL
Anion gap: 8 (ref 5–15)
BUN: 18 mg/dL (ref 8–23)
CO2: 27 mmol/L (ref 22–32)
Calcium: 8.5 mg/dL — ABNORMAL LOW (ref 8.9–10.3)
Chloride: 104 mmol/L (ref 98–111)
Creatinine, Ser: 0.82 mg/dL (ref 0.61–1.24)
GFR calc Af Amer: >60 mL/min (ref >60)
GFR calc non Af Amer: >60 mL/min (ref >60)
Glucose, Bld: 216 mg/dL — ABNORMAL HIGH (ref 70–99)
Potassium: 3.4 mmol/L — ABNORMAL LOW (ref 3.5–5.1)
Sodium: 139 mmol/L (ref 135–145)

## 2018-01-01 LAB — HIV ANTIBODY (ROUTINE TESTING W REFLEX): HIV Screen 4th Generation wRfx: NONREACTIVE

## 2018-01-01 MED ORDER — POTASSIUM CHLORIDE CRYS ER 20 MEQ PO TBCR
40.0000 meq | EXTENDED_RELEASE_TABLET | Freq: Once | ORAL | Status: AC
  Administered 2018-01-01: 40 meq via ORAL
  Filled 2018-01-01: qty 2

## 2018-01-01 NOTE — Progress Notes (Signed)
PROGRESS NOTE    Samuel [name]  ZOX:[id number] DOB: [birth year] DOA: 12/31/2017 PCP: [name], MD     Brief Narrative:  Samuel [name] is a 67 y.o. male with a medical history of chronic diastolic heart failure, coronary artery disease, diabetes mellitus, type II, hypertension, who presented to the emergency department with complaints of nausea and vomiting.  Patient states he has a history of gastroparesis.  He also states he has Reglan at home however this is not listed on his med rec.  States he has not been able to eat or drink anything in the past several days has had some abdominal soreness.  Complains of increased urinary frequency without dysuria.   In the emergency department, he was found to have a urinary tract infection.  He was started on ceftriaxone.  New events last 24 hours / Subjective: Tolerating clear liquid diet, states that he has no longer been having any nausea, vomiting or abdominal pain.  Wants to try full liquids today.  Assessment & Plan:   Principal Problem:   Intractable nausea and vomiting Active Problems:   Pulmonary embolism (HCC)   CAD S/P DES PCI to mLAD: Xience DES 2.75 x 15 (3.0 mm)   Essential hypertension   Gastroparesis   Chronic osteomyelitis involving lower leg (HCC)   Diabetes mellitus type 2, uncontrolled (HCC)   Acute lower UTI   Intractable nausea and vomiting  -?Gastroparesis vs viral -KUB: Prior cholecystectomy. No evidence of bowel obstruction or free air -Continue reglan  -Resolving -Advance to full liquid diet today   Urinary tract infection, POA  -UA positive nitrites, small leukocytes, WBC >50. Urine culture pending  -Continue ceftriaxone  Hypokalemia -Replace, trend   Acute hypoxemic respiratory failure  -Will attempt to wean [place] O2 as possible -Chest x-ray unremarkable   Diabetes mellitus, type II, uncontrolled with hyperglycemia  -Last hemoglobin A1c was 12.4 on 09/22/2017 -Continue Levemir, SSI  Essential  hypertension -Continue amlodipine, metoprolol -Hold Lasix  Chronic diastolic heart failure -Currently appears to be euvolemic and compensated -Echocardiogram 11/19/2016 shows an EF of 60 to 65%, grade 1 diastolic dysfunction -Hold Lasix -Monitor intake and output, daily weights  Coronary artery disease -Currently chest pain-free, continue lipitor, metoprolol, aspirin, Plavix, ranexa   History of PE -Continue Eliquis   DVT prophylaxis: Eliquis Code Status: DNR Family Communication: No family at bedside Disposition Plan: Pending clinical improvement, urine culture result   Consultants:   None  Procedures:   None   Antimicrobials:  Anti-infectives (From admission, onward)   Start     Dose/Rate Route Frequency Ordered Stop   12/31/17 1500  cefTRIAXone (ROCEPHIN) 1 g in sodium chloride 0.9 % 100 mL IVPB  Status:  Discontinued     1 g 200 mL/hr over 30 Minutes Intravenous Every 24 hours 12/31/17 1449 12/31/17 1454   12/31/17 1230  cefTRIAXone (ROCEPHIN) 1 g in sodium chloride 0.9 % 100 mL IVPB     1 g 200 mL/hr over 30 Minutes Intravenous Every 24 hours 12/31/17 1222          Objective: Vitals:   12/31/17 1438 12/31/17 2051 01/01/18 0229 01/01/18 0535  BP: (!) 147/75 (!) 147/69  (!) 125/47  Pulse: 94 93  80  Resp: 18 16    Temp: 98 F (36.7 C) 98.3 F (36.8 C)  98.5 F (36.9 C)  TempSrc: Oral Oral  Oral  SpO2: 100% (!) 89%  91%  Weight:   128.6 kg (283 lb 8.2 oz)  Height:        Intake/Output Summary (Last 24 hours) at 01/01/2018 1421 Last data filed at 01/01/2018 1248 Gross per 24 hour  Intake 1142.5 ml  Output 1540 ml  Net -397.5 ml   Filed Weights   12/31/17 1134 01/01/18 0229  Weight: (!) 140.6 kg (310 lb) 128.6 kg (283 lb 8.2 oz)    Examination:  General exam: Appears calm and comfortable  Respiratory system: Clear to auscultation. Respiratory effort normal. Cardiovascular system: S1 & S2 heard, RRR. No JVD, murmurs, rubs, gallops or clicks.  No pedal edema. Gastrointestinal system: Abdomen is nondistended, soft and nontender. No organomegaly or masses felt. Normal bowel sounds heard. Central nervous system: Alert and oriented. No focal neurological deficits. Extremities: Symmetric 5 x 5 power. Skin: No rashes, lesions or ulcers Psychiatry: Judgement and insight appear normal. Mood & affect appropriate.   Data Reviewed: I have personally reviewed following labs and imaging studies  CBC: Recent Labs  Lab 12/31/17 1123 01/01/18 0404  WBC 8.0 6.9  NEUTROABS 6.8  --   HGB 13.7 12.9*  HCT 39.9 38.0*  MCV 91.1 91.6  PLT 265 240   Basic Metabolic Panel: Recent Labs  Lab 12/31/17 1123 01/01/18 0404  NA 138 139  K 4.3 3.4*  CL 100 104  CO2 24 27  GLUCOSE 314* 216*  BUN 20 18  CREATININE 0.99 0.82  CALCIUM 8.8* 8.5*   GFR: Estimated Creatinine Clearance: 133.2 mL/min (by C-G formula based on SCr of 0.82 mg/dL). Liver Function Tests: Recent Labs  Lab 12/31/17 1123  AST 20  ALT 16  ALKPHOS 87  BILITOT 1.0  PROT 7.0  ALBUMIN 3.4*   Recent Labs  Lab 12/31/17 1123  LIPASE 17   No results for input(s): AMMONIA in the last 168 hours. Coagulation Profile: No results for input(s): INR, PROTIME in the last 168 hours. Cardiac Enzymes: No results for input(s): CKTOTAL, CKMB, CKMBINDEX, TROPONINI in the last 168 hours. BNP (last 3 results) No results for input(s): PROBNP in the last 8760 hours. HbA1C: No results for input(s): HGBA1C in the last 72 hours. CBG: Recent Labs  Lab 12/31/17 1624 12/31/17 2044 01/01/18 0748 01/01/18 1252  GLUCAP 273* 214* 178* 198*   Lipid Profile: No results for input(s): CHOL, HDL, LDLCALC, TRIG, CHOLHDL, LDLDIRECT in the last 72 hours. Thyroid Function Tests: No results for input(s): TSH, T4TOTAL, FREET4, T3FREE, THYROIDAB in the last 72 hours. Anemia Panel: No results for input(s): VITAMINB12, FOLATE, FERRITIN, TIBC, IRON, RETICCTPCT in the last 72 hours. Sepsis  Labs: No results for input(s): PROCALCITON, LATICACIDVEN in the last 168 hours.  No results found for this or any previous visit (from the past 240 hour(s)).     Radiology Studies: Dg Abd Acute W/chest  Result Date: 12/31/2017 CLINICAL DATA:  Severe mid abdominal pain, nausea EXAM: DG ABDOMEN ACUTE W/ 1V CHEST COMPARISON:  Chest x-ray 12/02/2017 FINDINGS: Bibasilar atelectasis. Mild cardiomegaly. No overt edema. No effusions. Prior cholecystectomy. IVC filter in place. No bowel obstruction or free air. IMPRESSION: Prior cholecystectomy. No evidence of bowel obstruction or free air. Cardiomegaly.  Bibasilar atelectasis. Electronically Signed   By: Charlett Nose M.D.   On: 12/31/2017 11:08      Scheduled Meds: . amLODipine  10 mg Oral Daily  . apixaban  5 mg Oral BID  . aspirin EC  81 mg Oral Daily  . atorvastatin  80 mg Oral q1800  . clopidogrel  75 mg Oral Q breakfast  . famotidine  20 mg Oral BID  . ferrous sulfate  325 mg Oral QPM  . gabapentin  300 mg Oral QID  . insulin aspart  0-5 Units Subcutaneous QHS  . insulin aspart  0-9 Units Subcutaneous TID WC  . insulin detemir  40 Units Subcutaneous BID  . metoCLOPramide (REGLAN) injection  5 mg Intravenous Q8H  . metoprolol tartrate  25 mg Oral BID  . pantoprazole  40 mg Oral Daily  . ranolazine  500 mg Oral BID   Continuous Infusions: . cefTRIAXone (ROCEPHIN)  IV Stopped (01/01/18 1333)     LOS: 1 day    Time spent: 35 minutes   Noralee StainJennifer Shimika Ames, DO [facility] [email or web address] Password [email or web address] 01/01/2018, 2:21 PM

## 2018-01-01 NOTE — Consult Note (Signed)
   [facility]   01/01/2018  Beckie BusingJohn D Pequignot 21-Oct-1950 [id number]    [name] is active with [facility] (outpatient home based palliative care program administered by [facility] Spoke with Drenda FreezeFran and [facility] to confirm they are still following.  [facility] has been following for medication assistance. Please see chart review tab then encounters for patient outreach details [facility].  Inpatient RNCM was already aware of patient being active with [facility]. Made aware that [facility] active as well.    Raiford NobleAtika Gisela Lea, MSN-Ed, RN,BSN [facility] Liaison [phone]

## 2018-01-01 NOTE — Care Management Note (Signed)
Case Management Note  Patient Details  Name: Samuel [name] MRN: [id number] Date of Birth: May 28, 1951  Subjective/Objective:  67 yo with a medical history of chronic diastolic heart failure, coronary artery disease, diabetes mellitus, type II, hypertension, who presented to the emergency department with complaints of nausea and vomiting. Diagnosed with UTI  Action/Plan: From home alone. Pt states he has home palliative services with [facility]. This CM contacted [facility] and alerted of pt admission and pt desire to continue with home palliative services at discharge.  Expected Discharge Date:  (unknown)               Expected Discharge Plan:  Home/Self Care(Home palliative care)  In-House Referral:     Discharge planning Services  CM Consult  Post Acute Care Choice:  Resumption of Svcs/PTA Provider Choice offered to:     DME Arranged:    DME Agency:     HH Arranged:    HH Agency:  [facility] Palliative program)  Status of Service:  In process, will continue to follow  If discussed at Long Length of Stay Meetings, dates discussed:    Additional CommentsBartholome Bill:  Samuel Radu H, RN 01/01/2018, 10:29 AM  [phone]

## 2018-01-02 LAB — CBC
HEMATOCRIT: 39.5 % (ref 39.0–52.0)
Hemoglobin: 13.3 g/dL (ref 13.0–17.0)
MCH: 30.6 pg (ref 26.0–34.0)
MCHC: 33.7 g/dL (ref 30.0–36.0)
MCV: 90.8 fL (ref 78.0–100.0)
Platelets: 245 10*3/uL (ref 150–400)
RBC: 4.35 MIL/uL (ref 4.22–5.81)
RDW: 12.5 % (ref 11.5–15.5)
WBC: 6.5 10*3/uL (ref 4.0–10.5)

## 2018-01-02 LAB — BASIC METABOLIC PANEL
Anion gap: 8 (ref 5–15)
BUN: 14 mg/dL (ref 8–23)
CALCIUM: 8.5 mg/dL — AB (ref 8.9–10.3)
CO2: 25 mmol/L (ref 22–32)
CREATININE: 0.79 mg/dL (ref 0.61–1.24)
Chloride: 104 mmol/L (ref 98–111)
GFR calc non Af Amer: >60 mL/min (ref >60)
GLUCOSE: 187 mg/dL — AB (ref 70–99)
Potassium: 3.6 mmol/L (ref 3.5–5.1)
Sodium: 137 mmol/L (ref 135–145)

## 2018-01-02 LAB — GLUCOSE, CAPILLARY: Glucose-Capillary: 177 mg/dL — ABNORMAL HIGH (ref 70–99)

## 2018-01-02 MED ORDER — METOCLOPRAMIDE HCL 5 MG PO TABS: 5.0000 mg | ORAL_TABLET | Freq: Three times a day (TID) | ORAL | 0 refills | Status: DC

## 2018-01-02 MED ORDER — CEPHALEXIN 500 MG PO CAPS: 500.0000 mg | ORAL_CAPSULE | Freq: Two times a day (BID) | ORAL | 0 refills | Status: AC

## 2018-01-02 NOTE — Discharge Summary (Signed)
Physician Discharge Summary  RIAN BUSCHE ZOX:[id number] DOB: [birth year] DOA: 12/31/2017  PCP: [name], MD  Admit date: 12/31/2017 Discharge date: 01/02/2018  Admitted From: Home Disposition:  Home  Recommendations for Outpatient Follow-up:  1. Follow up with PCP in 1 week 2. Please follow up on the following pending results: Final urine culture result  Discharge Condition: Stable CODE STATUS: DNR  Diet recommendation: Carb modified   Brief/Interim Summary: Samuel [name] is a 67 y.o.malewith a medical history ofchronic diastolic heart failure, coronary artery disease, diabetes mellitus, type II, hypertension, who presented to the emergency department with complaints of nausea and vomiting. Patient states he has a history of gastroparesis. He also states he has Reglan at home however this is not listed on his med rec. States he has not been able to eat or drink anything in the past several days has had some abdominal soreness. Complains of increased urinary frequency without dysuria.  In the emergency department, he was found to have a urinary tract infection.  He was started on ceftriaxone.  His nausea and vomiting continued to improve on IV Reglan.  His diet was advanced and he was tolerating soft diet without any nausea or vomiting.  He reported feeling much better.  Urine culture yielded greater than 100,000 colonies of unknown identified organism.  Patient was very adamant about going home and stated that he would follow-up with his primary care physician.  He had clinical improvement with IV Rocephin which was transitioned to Keflex on discharge.  His urine culture result will need to be finalized and followed up by his primary care physician.   Discharge Diagnoses:  Principal Problem:   Intractable nausea and vomiting Active Problems:   Pulmonary embolism (HCC)   CAD S/P DES PCI to mLAD: Xience DES 2.75 x 15 (3.0 mm)   Essential hypertension   Gastroparesis   Chronic  osteomyelitis involving lower leg (HCC)   Diabetes mellitus type 2, uncontrolled (HCC)   Acute lower UTI   Intractable nausea and vomiting  -?Gastroparesis vs viral -KUB: Prior cholecystectomy. No evidence of bowel obstruction or free air -Continue reglan, transition to PO  -Resolved, tolerating soft diet now  Urinary tract infection, POA  -UA positive nitrites, small leukocytes,WBC >50. Urine culture pending  -Continue ceftriaxone --> Keflex on discharge  Acute hypoxemic respiratory failure  -Chest x-ray unremarkable  -Resolved, on room air  Diabetes mellitus, type II, uncontrolled with hyperglycemia  -Last hemoglobin A1c was 12.4on4/12/2017 -Continue Levemir, SSI  Essential hypertension -Continue amlodipine, metoprolol  Chronic diastolic heart failure -Currently appears to be euvolemic and compensated -Echocardiogram 11/19/2016 shows an EF of 60 to 65%, grade 1 diastolic dysfunction -Resume Lasix -Monitor intake and output, daily weights  Coronary artery disease -Currently chest pain-free, continue lipitor, metoprolol, aspirin, Plavix, ranexa   History of PE -Continue Eliquis     Discharge Instructions  Discharge Instructions    Call MD for:  difficulty breathing, headache or visual disturbances   Complete by:  As directed    Call MD for:  extreme fatigue   Complete by:  As directed    Call MD for:  hives   Complete by:  As directed    Call MD for:  persistant dizziness or light-headedness   Complete by:  As directed    Call MD for:  persistant nausea and vomiting   Complete by:  As directed    Call MD for:  severe uncontrolled pain   Complete by:  As directed  Call MD for:  temperature >100.4   Complete by:  As directed    Diet Carb Modified   Complete by:  As directed    Discharge instructions   Complete by:  As directed    You were cared for by a hospitalist during your hospital stay. If you have any questions about your discharge  medications or the care you received while you were in the hospital after you are discharged, you can call the unit and ask to speak with the hospitalist on call if the hospitalist that took care of you is not available. Once you are discharged, your primary care physician will handle any further medical issues. Please note that NO REFILLS for any discharge medications will be authorized once you are discharged, as it is imperative that you return to your primary care physician (or establish a relationship with a primary care physician if you do not have one) for your aftercare needs so that they can reassess your need for medications and monitor your lab values.   Increase activity slowly   Complete by:  As directed      Allergies as of 01/02/2018      Reactions   Gabapentin Hives, Itching, Rash   REACTION: rash/hives (per patient, was a reaction to an inactive ingredient in another mgf brand). The patient stated that he does take this medication now and it doesn't cause a rash any more.   Nabumetone Itching, Nausea Only, Rash   Reglan [metoclopramide] Hives, Nausea Only   Codeine Other (See Comments)   GI UPSET - hives      Medication List    TAKE these medications   amLODipine 10 MG tablet Commonly known as:  NORVASC Take 1 tablet (10 mg total) by mouth daily.   apixaban 5 MG Tabs tablet Commonly known as:  ELIQUIS Take 1 tablet (5 mg total) by mouth 2 (two) times daily.   aspirin 81 MG EC tablet Take 1 tablet (81 mg total) by mouth daily.   atorvastatin 80 MG tablet Commonly known as:  LIPITOR Take 1 tablet (80 mg total) by mouth daily at 6 PM.   cephALEXin 500 MG capsule Commonly known as:  KEFLEX Take 1 capsule (500 mg total) by mouth 2 (two) times daily for 10 days.   clopidogrel 75 MG tablet Commonly known as:  PLAVIX Take 1 tablet (75 mg total) by mouth daily.   ferrous sulfate 325 (65 FE) MG tablet Take 325 mg by mouth every evening.   furosemide 20 MG  tablet Commonly known as:  LASIX Take 1 tablet (20 mg total) by mouth daily. What changed:  how much to take   gabapentin 300 MG capsule Commonly known as:  NEURONTIN Take 300 mg by mouth 4 (four) times daily.   insulin detemir 100 UNIT/ML injection Commonly known as:  LEVEMIR Inject 0.25 mLs (25 Units total) into the skin 2 (two) times daily. What changed:  how much to take   insulin lispro 100 UNIT/ML injection Commonly known as:  HUMALOG Inject 0-10 Units into the skin 3 (three) times daily with meals. Inject SQ per sliding scale 150-250 (5 units), 251-350(8 units), 351-450(10 units), >450=10 units   lansoprazole 30 MG capsule Commonly known as:  PREVACID Take 30 mg by mouth 2 (two) times daily.   metoCLOPramide 5 MG tablet Commonly known as:  REGLAN Take 1 tablet (5 mg total) by mouth 3 (three) times daily.   metoprolol tartrate 25 MG tablet Commonly known as:  LOPRESSOR Take 1 tablet (25 mg total) by mouth 2 (two) times daily.   nitroGLYCERIN 0.4 MG SL tablet Commonly known as:  NITROSTAT Place 1 tablet (0.4 mg total) under the tongue every 5 (five) minutes as needed for chest pain.   ONETOUCH VERIO test strip Generic drug:  glucose blood Use to test blood sugar twice daily   promethazine 25 MG tablet Commonly known as:  PHENERGAN Take 25-50 mg by mouth every 4 (four) hours as needed for nausea or vomiting.   ranitidine 150 MG tablet Commonly known as:  ZANTAC Take 150 mg by mouth 2 (two) times daily.   ranolazine 500 MG 12 hr tablet Commonly known as:  RANEXA Take 1 tablet (500 mg total) by mouth 2 (two) times daily.   traMADol 50 MG tablet Commonly known as:  ULTRAM Take 100 mg by mouth every 6 (six) hours as needed for moderate pain.      Follow-up Information    [facility] Follow up.   Why:  Care connection program  Contact information: [address] [place] [address] [phone]        Pearson GrippeKim, James, MD. Schedule an  appointment as soon as possible for a visit in 1 week(s).   Specialty:  Internal Medicine Why:  Make sure to follow up on final urine culture results  Contact information: [address] [place] [address] [phone]          Allergies  Allergen Reactions  . Gabapentin Hives, Itching and Rash    REACTION: rash/hives (per patient, was a reaction to an inactive ingredient in another mgf brand). The patient stated that he does take this medication now and it doesn't cause a rash any more.  . Nabumetone Itching, Nausea Only and Rash  . Reglan [Metoclopramide] Hives and Nausea Only  . Codeine Other (See Comments)    GI UPSET - hives    Consultations:  None    Procedures/Studies: Dg Abd Acute W/chest  Result Date: 12/31/2017 CLINICAL DATA:  Severe mid abdominal pain, nausea EXAM: DG ABDOMEN ACUTE W/ 1V CHEST COMPARISON:  Chest x-ray 12/02/2017 FINDINGS: Bibasilar atelectasis. Mild cardiomegaly. No overt edema. No effusions. Prior cholecystectomy. IVC filter in place. No bowel obstruction or free air. IMPRESSION: Prior cholecystectomy. No evidence of bowel obstruction or free air. Cardiomegaly.  Bibasilar atelectasis. Electronically Signed   By: Charlett NoseKevin  Dover M.D.   On: 12/31/2017 11:08      Discharge Exam: Vitals:   01/01/18 2210 01/02/18 0705  BP: (!) 116/55 (!) 119/52  Pulse: 72 74  Resp: 16 17  Temp: 98.2 F (36.8 C) 97.9 F (36.6 C)  SpO2: 92% 97%    General: Pt is alert, awake, not in acute distress Cardiovascular: RRR, S1/S2 +, no rubs, no gallops Respiratory: CTA bilaterally, no wheezing, no rhonchi Abdominal: Soft, NT, ND, bowel sounds + Extremities: no edema, no cyanosis    The results of significant diagnostics from this hospitalization (including imaging, microbiology, ancillary and laboratory) are listed below for reference.     Microbiology: Recent Results (from the past 240 hour(s))  Culture, Urine     Status: Abnormal (Preliminary  result)   Collection Time: 12/31/17 11:23 AM  Result Value Ref Range Status   Specimen Description   Final    URINE, CLEAN CATCH Performed at [facility], [address] [place], [place] [address]    Special Requests   Final    NONE Performed at [facility], [address] [place], [place] [address]    Culture (A)  Final    >=100,000 COLONIES/mL UNIDENTIFIED ORGANISM Performed at [facility], [address] [place], [place] [address]    Report Status PENDING  Incomplete     Labs: BNP (last 3 results) Recent Labs    11/01/17 1128  BNP 90.8   Basic Metabolic Panel: Recent Labs  Lab 12/31/17 1123 01/01/18 0404 01/02/18 0455  NA 138 139 137  K 4.3 3.4* 3.6  CL 100 104 104  CO2 24 27 25   GLUCOSE 314* 216* 187*  BUN 20 18 14   CREATININE 0.99 0.82 0.79  CALCIUM 8.8* 8.5* 8.5*   Liver Function Tests: Recent Labs  Lab 12/31/17 1123  AST 20  ALT 16  ALKPHOS 87  BILITOT 1.0  PROT 7.0  ALBUMIN 3.4*   Recent Labs  Lab 12/31/17 1123  LIPASE 17   No results for input(s): AMMONIA in the last 168 hours. CBC: Recent Labs  Lab 12/31/17 1123 01/01/18 0404 01/02/18 0455  WBC 8.0 6.9 6.5  NEUTROABS 6.8  --   --   HGB 13.7 12.9* 13.3  HCT 39.9 38.0* 39.5  MCV 91.1 91.6 90.8  PLT 265 240 245   Cardiac Enzymes: No results for input(s): CKTOTAL, CKMB, CKMBINDEX, TROPONINI in the last 168 hours. BNP: Invalid input(s): POCBNP CBG: Recent Labs  Lab 01/01/18 0748 01/01/18 1252 01/01/18 1828 01/01/18 2214 01/02/18 0752  GLUCAP 178* 198* 234* 196* 177*   D-Dimer No results for input(s): DDIMER in the last 72 hours. Hgb A1c No results for input(s): HGBA1C in the last 72 hours. Lipid Profile No results for input(s): CHOL, HDL, LDLCALC, TRIG, CHOLHDL, LDLDIRECT in the last 72 hours. Thyroid function studies No results for input(s): TSH, T4TOTAL, T3FREE, THYROIDAB in the last 72 hours.  Invalid input(s):  FREET3 Anemia work up No results for input(s): VITAMINB12, FOLATE, FERRITIN, TIBC, IRON, RETICCTPCT in the last 72 hours. Urinalysis    Component Value Date/Time   COLORURINE YELLOW 12/31/2017 1123   APPEARANCEUR CLEAR 12/31/2017 1123   LABSPEC 1.025 12/31/2017 1123   PHURINE 6.0 12/31/2017 1123   GLUCOSEU >=500 (A) 12/31/2017 1123   HGBUR SMALL (A) 12/31/2017 1123   BILIRUBINUR NEGATIVE 12/31/2017 1123   KETONESUR 80 (A) 12/31/2017 1123   PROTEINUR 30 (A) 12/31/2017 1123   UROBILINOGEN 1.0 04/05/2015 1155   NITRITE POSITIVE (A) 12/31/2017 1123   LEUKOCYTESUR SMALL (A) 12/31/2017 1123   Sepsis Labs Invalid input(s): PROCALCITONIN,  WBC,  LACTICIDVEN Microbiology Recent Results (from the past 240 hour(s))  Culture, Urine     Status: Abnormal (Preliminary result)   Collection Time: 12/31/17 11:23 AM  Result Value Ref Range Status   Specimen Description   Final    URINE, CLEAN CATCH Performed at [facility], [address], [place], [place] [address]    Special Requests   Final    NONE Performed at [facility], [address] [place], [place] [address]    Culture (A)  Final    >=100,000 COLONIES/mL UNIDENTIFIED ORGANISM Performed at [facility], [address] [place], [place] [address]    Report Status PENDING  Incomplete     Patient was seen and examined on the day of discharge and was found to be in stable condition. Time coordinating discharge: 25 minutes including assessment and coordination of care, as well as examination of the patient.   SIGNED:  Noralee Stain,  DO [facility] Pager [phone]  If 7PM-7AM, please contact night-coverage [email or web address] Password [facility] 01/02/2018, 10:42 AM

## 2018-01-03 ENCOUNTER — Encounter: Payer: Self-pay | Admitting: Pharmacist

## 2018-01-03 ENCOUNTER — Other Ambulatory Visit: Payer: Self-pay | Admitting: Pharmacist

## 2018-01-03 DIAGNOSIS — E1165 Type 2 diabetes mellitus with hyperglycemia: Secondary | ICD-10-CM | POA: Diagnosis not present

## 2018-01-03 DIAGNOSIS — I5032 Chronic diastolic (congestive) heart failure: Secondary | ICD-10-CM | POA: Diagnosis not present

## 2018-01-03 DIAGNOSIS — I2510 Atherosclerotic heart disease of native coronary artery without angina pectoris: Secondary | ICD-10-CM | POA: Diagnosis not present

## 2018-01-03 DIAGNOSIS — R112 Nausea with vomiting, unspecified: Secondary | ICD-10-CM | POA: Diagnosis not present

## 2018-01-03 DIAGNOSIS — I10 Essential (primary) hypertension: Secondary | ICD-10-CM | POA: Diagnosis not present

## 2018-01-03 DIAGNOSIS — N390 Urinary tract infection, site not specified: Secondary | ICD-10-CM | POA: Diagnosis not present

## 2018-01-03 DIAGNOSIS — Z09 Encounter for follow-up examination after completed treatment for conditions other than malignant neoplasm: Secondary | ICD-10-CM | POA: Diagnosis not present

## 2018-01-03 DIAGNOSIS — Z86711 Personal history of pulmonary embolism: Secondary | ICD-10-CM | POA: Diagnosis not present

## 2018-01-03 DIAGNOSIS — J9691 Respiratory failure, unspecified with hypoxia: Secondary | ICD-10-CM | POA: Diagnosis not present

## 2018-01-03 LAB — URINE CULTURE: Culture: >=100000 — AB

## 2018-01-03 NOTE — Patient Outreach (Signed)
[facility]) Care Management  [facility]   01/03/2018  Samuel BusingJohn D [name] 07/01/1950 [id number]  Subjective: Patient is a 67 year old male with multiple medical conditions including but not limited to:  Allergic rhinitis, COPD, type 2 diabetes, hypertension, gastroparesis, GERD, hyperlipidemia, obesity, pulmonary embolism, and below the knee amputation.  Patient has been hospitalized multiple times in the last few weeks. Most recently he was hospitalized for UTI.    Objective:   Encounter Medications: Outpatient Encounter Medications as of 01/03/2018  Medication Sig Note  . amLODipine (NORVASC) 10 MG tablet Take 1 tablet (10 mg total) by mouth daily. 12/31/2017: Last refill 6/27 for 30  . apixaban (ELIQUIS) 5 MG TABS tablet Take 1 tablet (5 mg total) by mouth 2 (two) times daily. 12/31/2017: No refill history in last 3 mo but wife claims he has been taking every day BID  . aspirin 81 MG EC tablet Take 1 tablet (81 mg total) by mouth daily.   . cephALEXin (KEFLEX) 500 MG capsule Take 1 capsule (500 mg total) by mouth 2 (two) times daily for 10 days.   . clopidogrel (PLAVIX) 75 MG tablet Take 1 tablet (75 mg total) by mouth daily. 12/31/2017: Last refill 6/27 for 30, pt's wife claims takes both Plavix and Eliquis every day  . ferrous sulfate 325 (65 FE) MG tablet Take 325 mg by mouth every evening.    . gabapentin (NEURONTIN) 300 MG capsule Take 300 mg by mouth 4 (four) times daily.  12/31/2017: Last refill 5/30 for 30 DS  . insulin detemir (LEVEMIR) 100 UNIT/ML injection Inject 0.25 mLs (25 Units total) into the skin 2 (two) times daily. (Patient taking differently: Inject 85 Units into the skin 2 (two) times daily. ) 12/31/2017: No refill history for last 3 months but pt claims to be taking every day  . insulin lispro (HUMALOG) 100 UNIT/ML injection Inject 0-10 Units into the skin 3 (three) times daily with meals. Inject SQ per sliding scale 150-250 (5 units), 251-350(8 units),  351-450(10 units), >450=10 units 12/31/2017: No refill history for last 3 months but pt claims to be taking every day  . lansoprazole (PREVACID) 30 MG capsule Take 30 mg by mouth 2 (two) times daily. 12/31/2017: Last refill 4/05 for 30DS  . metoCLOPramide (REGLAN) 5 MG tablet Take 1 tablet (5 mg total) by mouth 3 (three) times daily.   . metoprolol tartrate (LOPRESSOR) 25 MG tablet Take 1 tablet (25 mg total) by mouth 2 (two) times daily. 12/31/2017: Last refill 6/27 for 30 DS  . nitroGLYCERIN (NITROSTAT) 0.4 MG SL tablet Place 1 tablet (0.4 mg total) under the tongue every 5 (five) minutes as needed for chest pain.   [name] test strip Use to test blood sugar twice daily   . promethazine (PHENERGAN) 25 MG tablet Take 25-50 mg by mouth every 4 (four) hours as needed for nausea or vomiting.  12/31/2017: Last refill 6/29 for 30 DS  . ranitidine (ZANTAC) 150 MG tablet Take 150 mg by mouth 2 (two) times daily.    . ranolazine (RANEXA) 500 MG 12 hr tablet Take 1 tablet (500 mg total) by mouth 2 (two) times daily. 12/31/2017: Last refill 5/31 for 30 DS  . traMADol (ULTRAM) 50 MG tablet Take 100 mg by mouth every 6 (six) hours as needed for moderate pain.  12/31/2017: Last refill 7/01 for 30 DS  . atorvastatin (LIPITOR) 80 MG tablet Take 1 tablet (80 mg total) by mouth daily  at 6 PM. 12/31/2017: No refill history in last 3 mo, pt unsure if taking  . furosemide (LASIX) 20 MG tablet Take 1 tablet (20 mg total) by mouth daily. (Patient taking differently: Take 40 mg by mouth daily. ) 12/31/2017: Last refill 6/27 for 30 DS   No facility-administered encounter medications on file as of 01/03/2018.     Functional Status: In your present state of health, do you have any difficulty performing the following activities: 12/31/2017 09/25/2017  Hearing? N N  Vision? Y N  Comment cataracts in both eyes -  Difficulty concentrating or making decisions? N N  Walking or climbing stairs? Y Y  Comment - -  Dressing or  bathing? N Y  Doing errands, shopping? N N  Comment - Chief Operating Officer and eating ? - N  Using the Toilet? - N  In the past six months, have you accidently leaked urine? - N  Do you have problems with loss of bowel control? - N  Managing your Medications? - N  Comment - -  Managing your Finances? - N  Housekeeping or managing your Housekeeping? - N  Comment - -  Some recent data might be hidden    Assessment:  ASSESSMENT: Date Discharged from Hospital: 01/01/18 Date Medication Reconciliation Performed: 01/03/2018  New Medications at Discharge: (delete if applicable)  Cephlexin 500 mg 1 capsule twice daily   Metoclopramide (patient was taking this prior to the hospitalization)  Patient was recently discharged from hospital and all medications have been reviewed   Drugs sorted by system:  Neurologic/Psychologic: Gabapentin   Cardiovascular: Amlodipine Eliquis Aspirin Atorvastatin Clopidogrel Metoprolol Nitroglycerin Ranexa Furosemide   Gastrointestinal: Lansoprazole Metoclopramide Promethazine Ranitidine Polyethylene glycol Docusate sodium  Endocrine: Levemir Humalog Acarbose  Pain: Tramadol  Vitamins/Minerals: Ferrous sulfate  Infectious Disease Cephalexin  Medication Review Findings:  Drug-Drug Interactions-  metoclopramide/promethazine-combination may put patient at risk for extra pyramidal side effects (patient said he alternates the dosing)  Eliquis/Clopidogrel/ASA- increased risk of bleeding  Promethazine/tramadol/gabapentin-increased risk of CNS depression   Therapeutic duplication:  High dose PPI (Lansoprazole BID) and Ranitidine  Medication Assistance Findings: -Patient was mailed an application for [employer] Patient Assistance Program.  He reported that he mailed the application back to [place].  [name] was called today and the representative said they did not have an application for the  patient.  When we mail patient's applications, we include a return address envelope.  If the patient mailed the application back it could have been mailed very recently back to our office.  Lilla Shook, CPhT handles our patient assistance applications and she is out this week. Note will be routed to her for follow up.    From a care management perspective, patient is being followed by [facility].  PLAN: -Home Visit Planned for 01/15/18 to inspect pill box and follow up on medication assistance.

## 2018-01-14 DIAGNOSIS — E1165 Type 2 diabetes mellitus with hyperglycemia: Secondary | ICD-10-CM | POA: Diagnosis not present

## 2018-01-15 ENCOUNTER — Ambulatory Visit: Payer: Self-pay | Admitting: Pharmacist

## 2018-01-15 ENCOUNTER — Other Ambulatory Visit: Payer: Self-pay | Admitting: Pharmacist

## 2018-01-15 NOTE — Patient Outreach (Signed)
[facility]) Care Management  01/15/2018  Beckie BusingJohn D Dalzell 1951-06-02 [id number]   Patient's caregiver ([name]) called to cancel today's home visit. She said the patient was nauseated today and preferred that I not come. Patient was being seen for medication assistance mostly. [facility] because he is being followed by Hospice.  Plan: Call patient back in 3-5 business days to reschedule.   Beecher McardleKatina J. Charls Custer, PharmD, BCACP [facility] Clinical Pharmacist [phone]

## 2018-01-18 ENCOUNTER — Other Ambulatory Visit: Payer: Self-pay

## 2018-01-18 ENCOUNTER — Encounter (HOSPITAL_COMMUNITY): Payer: Self-pay

## 2018-01-18 ENCOUNTER — Emergency Department (HOSPITAL_COMMUNITY): Payer: PPO

## 2018-01-18 ENCOUNTER — Emergency Department (HOSPITAL_COMMUNITY)
Admission: EM | Admit: 2018-01-18 | Discharge: 2018-01-18 | Disposition: A | Discharge status: Home / Self Care | Payer: PPO | Attending: Emergency Medicine | Admitting: Emergency Medicine

## 2018-01-18 DIAGNOSIS — E1143 Type 2 diabetes mellitus with diabetic autonomic (poly)neuropathy: Secondary | ICD-10-CM | POA: Insufficient documentation

## 2018-01-18 DIAGNOSIS — R1111 Vomiting without nausea: Secondary | ICD-10-CM | POA: Diagnosis not present

## 2018-01-18 DIAGNOSIS — I10 Essential (primary) hypertension: Secondary | ICD-10-CM | POA: Diagnosis not present

## 2018-01-18 DIAGNOSIS — I2510 Atherosclerotic heart disease of native coronary artery without angina pectoris: Secondary | ICD-10-CM | POA: Insufficient documentation

## 2018-01-18 DIAGNOSIS — E1165 Type 2 diabetes mellitus with hyperglycemia: Secondary | ICD-10-CM | POA: Diagnosis not present

## 2018-01-18 DIAGNOSIS — Z955 Presence of coronary angioplasty implant and graft: Secondary | ICD-10-CM | POA: Insufficient documentation

## 2018-01-18 DIAGNOSIS — Z7982 Long term (current) use of aspirin: Secondary | ICD-10-CM | POA: Diagnosis not present

## 2018-01-18 DIAGNOSIS — K3184 Gastroparesis: Secondary | ICD-10-CM

## 2018-01-18 DIAGNOSIS — R1110 Vomiting, unspecified: Secondary | ICD-10-CM | POA: Diagnosis present

## 2018-01-18 DIAGNOSIS — Z7901 Long term (current) use of anticoagulants: Secondary | ICD-10-CM | POA: Diagnosis not present

## 2018-01-18 DIAGNOSIS — J449 Chronic obstructive pulmonary disease, unspecified: Secondary | ICD-10-CM | POA: Diagnosis not present

## 2018-01-18 DIAGNOSIS — R110 Nausea: Secondary | ICD-10-CM | POA: Diagnosis not present

## 2018-01-18 DIAGNOSIS — Z79899 Other long term (current) drug therapy: Secondary | ICD-10-CM | POA: Insufficient documentation

## 2018-01-18 DIAGNOSIS — E1140 Type 2 diabetes mellitus with diabetic neuropathy, unspecified: Secondary | ICD-10-CM | POA: Diagnosis not present

## 2018-01-18 DIAGNOSIS — Z794 Long term (current) use of insulin: Secondary | ICD-10-CM | POA: Diagnosis not present

## 2018-01-18 DIAGNOSIS — R739 Hyperglycemia, unspecified: Secondary | ICD-10-CM

## 2018-01-18 LAB — URINALYSIS, ROUTINE W REFLEX MICROSCOPIC
BACTERIA UA: NONE SEEN
Bilirubin Urine: NEGATIVE
Glucose, UA: >=500 mg/dL — AB
Ketones, ur: 80 mg/dL — AB
Leukocytes, UA: NEGATIVE
Nitrite: NEGATIVE
Protein, ur: 30 mg/dL — AB
SPECIFIC GRAVITY, URINE: 1.029 (ref 1.005–1.030)
pH: 5 (ref 5.0–8.0)

## 2018-01-18 LAB — CBC
HEMATOCRIT: 44 % (ref 39.0–52.0)
HEMOGLOBIN: 15.3 g/dL (ref 13.0–17.0)
MCH: 30.9 pg (ref 26.0–34.0)
MCHC: 34.8 g/dL (ref 30.0–36.0)
MCV: 88.9 fL (ref 78.0–100.0)
Platelets: 260 10*3/uL (ref 150–400)
RBC: 4.95 MIL/uL (ref 4.22–5.81)
RDW: 12.3 % (ref 11.5–15.5)
WBC: 8.4 10*3/uL (ref 4.0–10.5)

## 2018-01-18 LAB — BASIC METABOLIC PANEL
ANION GAP: 17 — AB (ref 5–15)
BUN: 20 mg/dL (ref 8–23)
CALCIUM: 9.2 mg/dL (ref 8.9–10.3)
CHLORIDE: 99 mmol/L (ref 98–111)
CO2: 21 mmol/L — AB (ref 22–32)
Creatinine, Ser: 1.09 mg/dL (ref 0.61–1.24)
GFR calc non Af Amer: >60 mL/min (ref >60)
Glucose, Bld: 380 mg/dL — ABNORMAL HIGH (ref 70–99)
Potassium: 4.3 mmol/L (ref 3.5–5.1)
Sodium: 137 mmol/L (ref 135–145)

## 2018-01-18 LAB — COMPREHENSIVE METABOLIC PANEL
ALBUMIN: 3.8 g/dL (ref 3.5–5.0)
ALT: 20 U/L (ref 0–44)
ANION GAP: 19 — AB (ref 5–15)
AST: 17 U/L (ref 15–41)
Alkaline Phosphatase: 94 U/L (ref 38–126)
BUN: 21 mg/dL (ref 8–23)
CALCIUM: 9.3 mg/dL (ref 8.9–10.3)
CHLORIDE: 95 mmol/L — AB (ref 98–111)
CO2: 20 mmol/L — AB (ref 22–32)
Creatinine, Ser: 1.06 mg/dL (ref 0.61–1.24)
GFR calc non Af Amer: >60 mL/min (ref >60)
GLUCOSE: 381 mg/dL — AB (ref 70–99)
POTASSIUM: 4.3 mmol/L (ref 3.5–5.1)
SODIUM: 134 mmol/L — AB (ref 135–145)
Total Bilirubin: 1.7 mg/dL — ABNORMAL HIGH (ref 0.3–1.2)
Total Protein: 7.6 g/dL (ref 6.5–8.1)

## 2018-01-18 LAB — CBG MONITORING, ED: Glucose-Capillary: 353 mg/dL — ABNORMAL HIGH (ref 70–99)

## 2018-01-18 LAB — LIPASE, BLOOD: LIPASE: 20 U/L (ref 11–51)

## 2018-01-18 MED ORDER — SODIUM CHLORIDE 0.9 % IV BOLUS
500.0000 mL | Freq: Once | INTRAVENOUS | Status: AC
  Administered 2018-01-18: 500 mL via INTRAVENOUS

## 2018-01-18 MED ORDER — INSULIN ASPART 100 UNIT/ML ~~LOC~~ SOLN
5.0000 [IU] | Freq: Once | SUBCUTANEOUS | Status: AC
  Administered 2018-01-18: 5 [IU] via SUBCUTANEOUS
  Filled 2018-01-18: qty 1

## 2018-01-18 MED ORDER — PROMETHAZINE HCL 25 MG/ML IJ SOLN
12.5000 mg | Freq: Once | INTRAMUSCULAR | Status: AC
  Administered 2018-01-18: 12.5 mg via INTRAVENOUS
  Filled 2018-01-18: qty 1

## 2018-01-18 MED ORDER — METOCLOPRAMIDE HCL 5 MG/ML IJ SOLN
10.0000 mg | Freq: Once | INTRAMUSCULAR | Status: AC
  Administered 2018-01-18: 10 mg via INTRAVENOUS
  Filled 2018-01-18: qty 2

## 2018-01-18 NOTE — ED Notes (Signed)
Bed: [id number] Expected date: 01/18/18 Expected time: 8:51 AM Means of arrival: Ambulance Comments: Nausea CBG 330

## 2018-01-18 NOTE — ED Triage Notes (Signed)
Pt comes from home. Pt brought in via [facility]. Pt has left [facility]. Pt is AOx4 and uses wheelchair at home but can stand and pivot. Pt called 911 due to Nausea that has persisted for about 1.5 days. No vomiting and no diarrhea. Pt did have 1 episode of emesis while in route to [facility]. Pt stated he has not eaten in 1.5 days. Pt also stated he has not been taking regular medications since nausea has started.   Pt is hospice patient, however patient does not know if patient is hospice of [facility] or other.

## 2018-01-18 NOTE — ED Notes (Signed)
ED Provider at bedside. 

## 2018-01-18 NOTE — ED Notes (Signed)
Pt placed on 2L [place]

## 2018-01-18 NOTE — ED Notes (Signed)
Pt's O2 not on, pt placed back on 2L

## 2018-01-18 NOTE — ED Notes (Signed)
Pt soiled himself. This RN and Molly Maduroobert EMT cleaned pt up, changing linen and depends.

## 2018-01-18 NOTE — ED Provider Notes (Signed)
[facility]-EMERGENCY DEPT Provider Note   CSN: [id number] Arrival date & time: 01/18/18  0857     History   Chief Complaint Chief Complaint  Patient presents with  . Nausea    HPI Samuel [name] is a 67 y.o. male.  Patient is a 67 year old male with a history of diabetes, hypertension, hyperlipidemia and gastroparesis who presents with vomiting.  He states he had a 2-day history of worsening nausea with some occasional vomiting.  He has some soreness across the top of his abdomen which he says is typical when he has the symptoms.  His symptoms are consistent with his prior episodes of gastroparesis.  He denies any fevers.  No cough.  He has some shortness of breath which he says is baseline for him.  He has an appointment with the pulmonologist to have sleep studies because he feels that he has sleep apnea.  He feels he needs to be on oxygen.  He does not report any symptoms that are different than his baseline symptoms.  He request to be on oxygen here and feels like he needs to be on oxygen at home.  He had a prior PE in 2012 and is status post IVC filter.  He is on Eliquis.     Past Medical History:  Diagnosis Date  . Anxiety   . Arthritis    "all over"   . CAD (coronary artery disease)   . Charcot's joint    "left foot"  . 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 Depression   . Gastroparesis   . GERD (gastroesophageal reflux disease)   . H/O hiatal hernia   . Hyperlipidemia   . Hypertension   . Myocardial infarction (HCC) 2017/03/27   around this date  . OSA (obstructive sleep apnea)    "not bad enough for a mask"  . Peripheral neuropathy   . Peripheral vascular disease (HCC)   . PONV (postoperative nausea and vomiting)   . Pulmonary embolism (HCC)    hx. of 2012  . Shortness of breath    exertion  . Type II diabetes mellitus [facility])     Patient Active Problem List   Diagnosis Date Noted  . Acute lower UTI 12/31/2017  .  Uncontrolled diabetes mellitus (HCC) 09/23/2017  . DKA, type 2, not at goal [facility]) 09/22/2017  . S/P BKA (below knee amputation) (HCC) 04/19/2017  . COPD (chronic obstructive pulmonary disease) (HCC) 04/19/2017  . NSTEMI (non-ST elevated myocardial infarction) (HCC) 04/07/2017  . Osteomyelitis of foot (HCC)   . Protein-calorie malnutrition, severe (HCC) 03/08/2015  . Osteomyelitis of foot, acute (HCC) 03/07/2015  . Sepsis (HCC) 03/07/2015  . Diabetes mellitus type 2, uncontrolled (HCC) 03/07/2015  . Normocytic normochromic anemia 03/07/2015  . Obesity (BMI 30-39.9) 03/07/2015  . Nausea & vomiting   . DM (diabetes mellitus), type 2 (HCC) 01/07/2014  . Chronic osteomyelitis involving lower leg (HCC) 01/07/2014  . Vomiting 12/22/2013  . Gastroparesis 12/21/2013  . Intractable nausea and vomiting 12/21/2013  . Essential hypertension 05/01/2013  . Hyperlipidemia due to type 2 diabetes mellitus (HCC) 05/01/2013  . Generalized weakness 03/20/2013  . Back pain 03/20/2013  . Pulmonary embolism (HCC) 03/27/2011  . PERFORATION OF GALLBLADDER 08/30/2010  . Ulcer of lower limb, unspecified 03/01/2010  . METHICILLIN SUSCEPTIBLE STAPH AUREUS SEPTICEMIA 01/25/2010  . Acute osteomyelitis, ankle and foot 01/25/2010  . NAUSEA AND VOMITING 01/25/2010  . GERD 11/09/2009  . CAD S/P DES PCI to mLAD:  Xience DES 2.75 x 15 (3.0 mm) 05/01/2009  . DIABETES, TYPE 1 05/14/2008  . OBSTRUCTIVE SLEEP APNEA 04/30/2008  . ALLERGIC RHINITIS, SEASONAL 04/30/2008    Past Surgical History:  Procedure Laterality Date  . AMPUTATION Left 03/08/2015   Procedure:  LEFT AMPUTATION BELOW KNEE;  Surgeon: Toni Arthurs, MD;  Location: [facility] ORS;  Service: Orthopedics;  Laterality: Left;  . APPLICATION OF WOUND VAC Left 01/07/2014  . CHOLECYSTECTOMY  06/2010  . CORONARY ANGIOPLASTY WITH STENT PLACEMENT  05/2009   "1"  . FOOT SURGERY Left 2010   "for Charcot's joint"  . HARDWARE REMOVAL Left 01/07/2014   Procedure: LEFT LEG  REMOVAL OF DEEP IMPLANT AND SEQUESTRECTOMY; APPLICATION OF WOUND VAC ;  Surgeon: Toni Arthurs, MD;  Location: [facility] OR;  Service: Orthopedics;  Laterality: Left;  . I&D EXTREMITY Left "multiple"   leg  . 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 IM NAILING TIBIA Left ~ 2012  . LEFT HEART CATH AND CORONARY ANGIOGRAPHY N/A 04/08/2017   Procedure: LEFT HEART CATH AND CORONARY ANGIOGRAPHY;  Surgeon: Runell Gess, MD;  Location: [facility] INVASIVE CV LAB;  Service: Cardiovascular;  Laterality: N/A;  . SHOULDER ARTHROSCOPY W/ ROTATOR CUFF REPAIR Left    "and bone spurs"  . TIBIAL IM ROD REMOVAL Left 01/07/2014  . TOE AMPUTATION Right ~ 2011   "great toe"  . VENA CAVA FILTER PLACEMENT  2012  . VENA CAVA FILTER PLACEMENT N/A 11/20/2016   Procedure: INSERTION VENA-CAVA FILTER;  Surgeon: Sherren Kerns, MD;  Location: [facility] OR;  Service: Vascular;  Laterality: N/A;  . WOUND DEBRIDEMENT Left 01/07/2014   "tibia"        Home Medications    Prior to Admission medications   Medication Sig Start Date End Date Taking? Authorizing Provider  amLODipine (NORVASC) 10 MG tablet Take 1 tablet (10 mg total) by mouth daily. 11/22/16   Richarda Overlie, MD  apixaban (ELIQUIS) 5 MG TABS tablet Take 1 tablet (5 mg total) by mouth 2 (two) times daily. 09/24/17   Osvaldo Shipper, MD  aspirin 81 MG EC tablet Take 1 tablet (81 mg total) by mouth daily. 04/12/17   Glade Lloyd, MD  atorvastatin (LIPITOR) 80 MG tablet Take 1 tablet (80 mg total) by mouth daily at 6 PM. 04/11/17   Glade Lloyd, MD  clopidogrel (PLAVIX) 75 MG tablet Take 1 tablet (75 mg total) by mouth daily. 04/12/17   Glade Lloyd, MD  ferrous sulfate 325 (65 FE) MG tablet Take 325 mg by mouth every evening.     [provider]  furosemide (LASIX) 20 MG tablet Take 1 tablet (20 mg total) by mouth daily. Patient  taking differently: Take 40 mg by mouth daily.  04/12/17   Glade Lloyd, MD  gabapentin (NEURONTIN) 300 MG capsule Take 300 mg by mouth 4 (four) times daily.     [provider]  insulin detemir (LEVEMIR) 100 UNIT/ML injection Inject 0.25 mLs (25 Units total) into the skin 2 (two) times daily. Patient taking differently: Inject 85 Units into the skin 2 (two) times daily.  09/24/17   Osvaldo Shipper, MD  insulin lispro (HUMALOG) 100 UNIT/ML injection Inject 0-10 Units into the skin 3 (three) times daily with meals. Inject SQ per sliding scale 150-250 (5 units), 251-350(8 units), 351-450(10 units), >450=10 units    [provider]  lansoprazole (  PREVACID) 30 MG capsule Take 30 mg by mouth 2 (two) times daily. 03/19/17   [provider]  metoCLOPramide (REGLAN) 5 MG tablet Take 1 tablet (5 mg total) by mouth 3 (three) times daily. 01/02/18 02/01/18  Noralee Stain, DO  metoprolol tartrate (LOPRESSOR) 25 MG tablet Take 1 tablet (25 mg total) by mouth 2 (two) times daily. 04/11/17   Glade Lloyd, MD  nitroGLYCERIN (NITROSTAT) 0.4 MG SL tablet Place 1 tablet (0.4 mg total) under the tongue every 5 (five) minutes as needed for chest pain. 04/11/17   Glade Lloyd, MD  [facility] VERIO test strip Use to test blood sugar twice daily 09/24/17   [provider]  promethazine (PHENERGAN) 25 MG tablet Take 25-50 mg by mouth every 4 (four) hours as needed for nausea or vomiting.     [provider]  ranitidine (ZANTAC) 150 MG tablet Take 150 mg by mouth 2 (two) times daily.     [provider]  ranolazine (RANEXA) 500 MG 12 hr tablet Take 1 tablet (500 mg total) by mouth 2 (two) times daily. 04/11/17   Glade Lloyd, MD  traMADol (ULTRAM) 50 MG tablet Take 100 mg by mouth every 6 (six) hours as needed for moderate pain.     [provider]    Family History Family History  Problem Relation Age of Onset  . COPD Mother   . Heart disease Mother   . Lung  cancer Mother   . Retinal detachment Father   . Alcoholism Brother   . Heart disease Brother   . COPD Brother   . Diabetes Brother   . Hypertension Brother   . Stroke Brother     Social History Social History   Tobacco Use  . Smoking status: Never Smoker  . Smokeless tobacco: Never Used  Substance Use Topics  . Alcohol use: No    Frequency: Never    Comment: 01/07/2014 'might have a wine cooler a couple times/yr"  . Drug use: No     Allergies   Gabapentin; Nabumetone; Reglan [metoclopramide]; and Codeine   Review of Systems Review of Systems  Constitutional: Negative for chills, diaphoresis, fatigue and fever.  HENT: Negative for congestion, rhinorrhea and sneezing.   Eyes: Negative.   Respiratory: Positive for shortness of breath. Negative for cough and chest tightness.   Cardiovascular: Negative for chest pain and leg swelling.  Gastrointestinal: Positive for abdominal pain, nausea and vomiting. Negative for blood in stool and diarrhea.  Genitourinary: Negative for difficulty urinating, flank pain, frequency and hematuria.  Musculoskeletal: Negative for arthralgias and back pain.  Skin: Negative for rash.  Neurological: Negative for dizziness, speech difficulty, weakness, numbness and headaches.     Physical Exam Updated Vital Signs BP 124/83   Pulse 99   Temp 97.9 F (36.6 C)   Resp 19   Ht 6\' 4"  (1.93 m)   Wt (!) 140.6 kg (310 lb)   SpO2 90%   BMI 37.73 kg/m   Physical Exam  Constitutional: He is oriented to person, place, and time. He appears well-developed and well-nourished.  Morbidly obese  HENT:  Head: Normocephalic and atraumatic.  Eyes: Pupils are equal, round, and reactive to light.  Neck: Normal range of motion. Neck supple.  Cardiovascular: Normal rate, regular rhythm and normal heart sounds.  Pulmonary/Chest: Effort normal and breath sounds normal. No respiratory distress. He has no wheezes. He has no rales. He exhibits no tenderness.    Abdominal: Soft. Bowel sounds are normal. There  is tenderness (Mild tenderness across upper abdomen). There is no rebound and no guarding.  Musculoskeletal: Normal range of motion. He exhibits no edema.  Lymphadenopathy:    He has no cervical adenopathy.  Neurological: He is alert and oriented to person, place, and time.  Skin: Skin is warm and dry. No rash noted.  Psychiatric: He has a normal mood and affect.     ED Treatments / Results  Labs (all labs ordered are listed, but only abnormal results are displayed) Labs Reviewed  COMPREHENSIVE METABOLIC PANEL - Abnormal; Notable for the following components:      Result Value   Sodium 134 (*)    Chloride 95 (*)    CO2 20 (*)    Glucose, Bld 381 (*)    Total Bilirubin 1.7 (*)    Anion gap 19 (*)    All other components within normal limits  URINALYSIS, ROUTINE W REFLEX MICROSCOPIC - Abnormal; Notable for the following components:   Glucose, UA >=500 (*)    Hgb urine dipstick SMALL (*)    Ketones, ur 80 (*)    Protein, ur 30 (*)    All other components within normal limits  BASIC METABOLIC PANEL - Abnormal; Notable for the following components:   CO2 21 (*)    Glucose, Bld 380 (*)    Anion gap 17 (*)    All other components within normal limits  CBG MONITORING, ED - Abnormal; Notable for the following components:   Glucose-Capillary 353 (*)    All other components within normal limits  LIPASE, BLOOD  CBC    EKG EKG Interpretation  Date/Time:  Saturday January 18 2018 09:11:31 EDT Ventricular Rate:  86 PR Interval:    QRS Duration: 93 QT Interval:  391 QTC Calculation: 468 R Axis:   -2 Text Interpretation:  Sinus rhythm ST elevation consider inferior injury or acute infarct Lateral leads are also involved ST elevation appears similar to prior EKG Confirmed by Rolan BuccoBelfi, Leliana Kontz [phone]([id number]) on 01/18/2018 9:18:17 AM   Radiology Dg Abd Acute W/chest  Result Date: 01/18/2018 CLINICAL DATA:  Nausea.  History of hypertension  diabetes. EXAM: DG ABDOMEN ACUTE W/ 1V CHEST COMPARISON:  Acute abdomen series dated 12/31/2017. FINDINGS: Single-view of the chest: Heart size and mediastinal contours are stable. Lungs are clear. No pleural effusion or pneumothorax seen. Osseous structures about the chest are unremarkable. Supine and decubitus views of the abdomen: Visualized bowel gas pattern is nonobstructive. No evidence of soft tissue mass or abnormal fluid collection. No evidence of free intraperitoneal air. Cholecystectomy clips in the RIGHT upper quadrant. No evidence of renal or ureteral calculi. IVC filter is stable in position with tip at the level of the L2 vertebral body. No acute or suspicious osseous finding. IMPRESSION: 1. No evidence of acute cardiopulmonary abnormality. No evidence of pneumonia or pulmonary edema. 2. No evidence of acute intra-abdominal abnormality. Nonobstructive bowel gas pattern. Electronically Signed   By: Bary RichardStan  Maynard M.D.   On: 01/18/2018 10:15    Procedures Procedures (including critical care time)  Medications Ordered in ED Medications  metoCLOPramide (REGLAN) injection 10 mg (10 mg Intravenous Given 01/18/18 0945)  sodium chloride 0.9 % bolus 500 mL (0 mLs Intravenous Stopped 01/18/18 1201)  promethazine (PHENERGAN) injection 12.5 mg (12.5 mg Intravenous Given 01/18/18 1011)  sodium chloride 0.9 % bolus 500 mL ( Intravenous Stopped 01/18/18 1159)  insulin aspart (novoLOG) injection 5 Units (5 Units Subcutaneous Given 01/18/18 1410)     Initial Impression / Assessment  and Plan / ED Course  I have reviewed the triage vital signs and the nursing notes.  Pertinent labs & imaging results that were available during my care of the patient were reviewed by me and considered in my medical decision making (see chart for details).     Patient is a 67 year old male who has gastroparesis with recurrent episodes of vomiting.  He presents with similar symptoms today.  He has no tenderness on abdominal  exam.  He was given IV fluids and antiemetics.  He feels 100% better.  He states she wants to go home.  He initially had hyperglycemia on labs with an anion gap and a low bicarb.  Was concerning for some mild DKA.  He was given a full year of IV fluids.  He was drinking oral fluids.  I rechecked a BMP which shows a lower anion gap.  It very mild at this point.  Patient does not want to be admitted to the hospital.  He states that he will recheck his glucose when he gets home and adjust his insulin accordingly.  He was given 5 units of subcu insulin prior to discharge.  His chest x-ray was clear.  He has some baseline breathing issues but on recheck is not complaining of any shortness of breath.  He feels that he has sleep apnea.  He is maintaining oxygen saturations greater than 90-92% on RA.  He has an upcoming appointment with pulmonology to see if he qualifies for home oxygen.  He will have close follow-up with his PCP.  Return precautions are given.  Final Clinical Impressions(s) / ED Diagnoses   Final diagnoses:  Gastroparesis  Hyperglycemia    ED Discharge Orders    None       Rolan Bucco, MD 01/18/18 1450

## 2018-01-20 ENCOUNTER — Ambulatory Visit: Payer: Self-pay | Admitting: Pharmacist

## 2018-01-20 ENCOUNTER — Other Ambulatory Visit: Payer: Self-pay | Admitting: Pharmacist

## 2018-01-20 NOTE — Patient Outreach (Signed)
[facility] Care Management  01/20/2018  Beckie BusingJohn D Cercone 07/28/1950 [id number]   Patient was called regarding medication assistance. Unfortunately, he did not answer the phone. HIPAA compliant message was left on his voicemail.  Patient went back to the ED on 01/18/18 for gastroparesis.   Plan: Call patient back in 5-7 business days.   Beecher McardleKatina J. Savannah Erbe, PharmD, BCACP [facility] Clinical Pharmacist [phone]

## 2018-01-21 DIAGNOSIS — E1165 Type 2 diabetes mellitus with hyperglycemia: Secondary | ICD-10-CM | POA: Diagnosis not present

## 2018-01-24 ENCOUNTER — Encounter: Payer: Self-pay | Admitting: Emergency Medicine

## 2018-01-24 ENCOUNTER — Ambulatory Visit: Payer: PPO | Admitting: Emergency Medicine

## 2018-01-24 VITALS — BP 122/78 | HR 78 | Ht 75.0 in | Wt 310.0 lb

## 2018-01-24 DIAGNOSIS — I2782 Chronic pulmonary embolism: Secondary | ICD-10-CM | POA: Diagnosis not present

## 2018-01-24 DIAGNOSIS — J449 Chronic obstructive pulmonary disease, unspecified: Secondary | ICD-10-CM

## 2018-01-24 DIAGNOSIS — R0609 Other forms of dyspnea: Secondary | ICD-10-CM | POA: Insufficient documentation

## 2018-01-24 DIAGNOSIS — G4733 Obstructive sleep apnea (adult) (pediatric): Secondary | ICD-10-CM

## 2018-01-24 NOTE — Patient Instructions (Addendum)
Continue your Eliquis as you are taking it. You will need to be on anticoagulation life-long as long as no bleeding problems develop.  We will perform full pulmonary function testing at your next appointment We will hold on a repeat sleep study for now. We may need to revisit this at some point.  Follow with Dr Delton CoombesByrum next available with full PFT on the same day.

## 2018-01-24 NOTE — Assessment & Plan Note (Signed)
Remote pulmonary embolism with an IVC filter.  He is now on Eliquis and needs to be on anticoagulation for life as long as there is no contraindication identified.

## 2018-01-24 NOTE — Assessment & Plan Note (Signed)
Never smoker but he has severe obstruction on spirometry was done when he was admitted to the hospital in the past for coronary disease.  In fact this is what precluded him from undergoing CABG.  He is not currently on bronchodilators.  He denies any bronchitic symptoms or wheezing.  I think he needs repeat pulmonary function testing to better define his airflows.  His dyspnea may be largely related more to restriction and obstruction.  If he does have obstruction then a trial of bronchodilators would be warranted.

## 2018-01-24 NOTE — Assessment & Plan Note (Signed)
Suspect at least a component of restriction due to his obesity, deconditioning.  He may also have obstruction based on his remote spirometry.  I think we need to confirm with repeat pulmonary function testing if so then we will do a trial of bronchodilators.  He is at high risk for pulmonary hypertension given his possibly untreated obstructive sleep apnea.  We will need to evaluate this as we go forward.

## 2018-01-24 NOTE — Assessment & Plan Note (Signed)
Diagnosed remotely.  He tolerated CPAP for about 9 months and then stopped it.  He then [place] in [place] that apparently did not confirm sleep apnea.  Based on his family's description and observations I am suspicious that he does still have sleep disordered breathing.  He is not interested in a sleep study or CPAP right now.  He may be willing to reconsider as we go forward.

## 2018-01-24 NOTE — Progress Notes (Signed)
Subjective:    Patient ID: Samuel [name], male    DOB: [birth year], 67 y.o.   MRN: [id number]  HPI 67 year old gentleman whom I have seen before in the past, last in 2012.  Since then he has been dx with CAD. 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  He also has a history of pulmonary embolism from that time, has an IVC filter placed.  His current anticoagulation is with Eliquis 5mg  bid.  He has obstructive sleep apnea that was initially diagnosed in 2009.  He was on CPAP 13 cmH2O, stopped after about 9 months. Had a repeat PSG in [place] several years ago.   He is having exertional SOB. Will get SOB with walking. No cough or wheeze. No CP. Occasional chest tightness. He snores some. Has witnessed apneas per family.   Spirometry from 04/09/2017 hospitalization were reviewed today.  These show evidence for severe obstruction with probable superimposed restriction  Chest x-ray 12/02/2017 reviewed.  Shows shallow inspiration, normal heart size, blunted costophrenic angles without any overt infiltrates.     Review of Systems  Past Medical History:  Diagnosis Date  . Anxiety   . Arthritis    "all over"   . CAD (coronary artery disease)   . Charcot's joint    "left foot"  . 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 Depression   . Gastroparesis   . GERD (gastroesophageal reflux disease)   . H/O hiatal hernia   . Hyperlipidemia   . Hypertension   . Myocardial infarction (HCC) 2017/03/27   around this date  . OSA (obstructive sleep apnea)    "not bad enough for a mask"  . Peripheral neuropathy   . Peripheral vascular disease (HCC)   . PONV (postoperative nausea and vomiting)   . Pulmonary embolism (HCC)    hx. of 2012  . Shortness of breath    exertion  . Type II diabetes mellitus (HCC)      Family History  Problem Relation  Age of Onset  . COPD Mother   . Heart disease Mother   . Lung cancer Mother   . Retinal detachment Father   . Alcoholism Brother   . Heart disease Brother   . COPD Brother   . Diabetes Brother   . Hypertension Brother   . Stroke Brother      Social History   Socioeconomic History  . Marital status: Single    Spouse name: Not on file  . Number of children: Not on file  . Years of education: 49  . Highest education level: Not on file  Occupational History  . Occupation: DISABLITY    Employer: UNEMPLOYED  Social Needs  . Financial resource strain: Not on file  . Food insecurity:    Worry: Not on file    Inability: Not on file  . Transportation needs:    Medical: Not on file    Non-medical: Not on file  Tobacco Use  . Smoking status: Never Smoker  . Smokeless tobacco: Never Used  Substance and Sexual Activity  . Alcohol use: No    Frequency: Never    Comment: 01/07/2014 'might have a wine cooler a couple times/yr"  . Drug use: No  . Sexual activity: Never  Lifestyle  . Physical activity:    Days per week: Not on file  Minutes per session: Not on file  . Stress: Only a little  Relationships  . Social connections:    Talks on phone: Never    Gets together: Never    Attends religious service: Never    Active member of club or organization: No    Attends meetings of clubs or organizations: Never    Relationship status: Divorced  . Intimate partner violence:    Fear of current or ex partner: Not on file    Emotionally abused: Not on file    Physically abused: Not on file    Forced sexual activity: Not on file  Other Topics Concern  . Not on file  Social History Narrative  . Not on file     Allergies  Allergen Reactions  . Gabapentin Hives, Itching and Rash    REACTION: rash/hives (per patient, was a reaction to an inactive ingredient in another mgf brand). The patient stated that he does take this medication now and it doesn't cause a rash any more.  .  Nabumetone Itching, Nausea Only and Rash  . Reglan [Metoclopramide] Hives and Nausea Only  . Codeine Other (See Comments)    GI UPSET - hives     Outpatient Medications Prior to Visit  Medication Sig Dispense Refill  . amLODipine (NORVASC) 10 MG tablet Take 1 tablet (10 mg total) by mouth daily. 30 tablet 2  . apixaban (ELIQUIS) 5 MG TABS tablet Take 1 tablet (5 mg total) by mouth 2 (two) times daily. 60 tablet 0  . aspirin 81 MG EC tablet Take 1 tablet (81 mg total) by mouth daily. 30 tablet 0  . atorvastatin (LIPITOR) 80 MG tablet Take 1 tablet (80 mg total) by mouth daily at 6 PM. 30 tablet 0  . clopidogrel (PLAVIX) 75 MG tablet Take 1 tablet (75 mg total) by mouth daily. 30 tablet 0  . ferrous sulfate 325 (65 FE) MG tablet Take 325 mg by mouth every evening.     . furosemide (LASIX) 20 MG tablet Take 1 tablet (20 mg total) by mouth daily. (Patient taking differently: Take 40 mg by mouth daily. ) 30 tablet 0  . gabapentin (NEURONTIN) 300 MG capsule Take 300 mg by mouth 4 (four) times daily.     . insulin detemir (LEVEMIR) 100 UNIT/ML injection Inject 0.25 mLs (25 Units total) into the skin 2 (two) times daily. (Patient taking differently: Inject 85 Units into the skin 2 (two) times daily. ) 10 mL 11  . insulin lispro (HUMALOG) 100 UNIT/ML injection Inject 0-10 Units into the skin 3 (three) times daily with meals. Inject SQ per sliding scale 150-250 (5 units), 251-350(8 units), 351-450(10 units), >450=10 units    . lansoprazole (PREVACID) 30 MG capsule Take 30 mg by mouth 2 (two) times daily.  12  . metoCLOPramide (REGLAN) 5 MG tablet Take 1 tablet (5 mg total) by mouth 3 (three) times daily. 90 tablet 0  . metoprolol tartrate (LOPRESSOR) 25 MG tablet Take 1 tablet (25 mg total) by mouth 2 (two) times daily. 60 tablet 0  . nitroGLYCERIN (NITROSTAT) 0.4 MG SL tablet Place 1 tablet (0.4 mg total) under the tongue every 5 (five) minutes as needed for chest pain. 30 tablet 0  . ONETOUCH VERIO  test strip Use to test blood sugar twice daily  4  . promethazine (PHENERGAN) 25 MG tablet Take 25-50 mg by mouth every 4 (four) hours as needed for nausea or vomiting.     . ranitidine (ZANTAC) 150  MG tablet Take 150 mg by mouth 2 (two) times daily.     . ranolazine (RANEXA) 500 MG 12 hr tablet Take 1 tablet (500 mg total) by mouth 2 (two) times daily. 60 tablet 0  . traMADol (ULTRAM) 50 MG tablet Take 100 mg by mouth every 6 (six) hours as needed for moderate pain.      No facility-administered medications prior to visit.         Objective:   Physical Exam Vitals:   01/24/18 1540  BP: 122/78  Pulse: 78  SpO2: 96%  Weight: (!) 310 lb (140.6 kg)  Height: 6\' 3"  (1.905 m)   Gen: Pleasant, obese man, in no distress,  normal affect  ENT: No lesions,  mouth clear,  oropharynx clear, no postnasal drip  Neck: No JVD, no stridor  Lungs: No use of accessory muscles, slightly decreased at both bases, no wheeze or crackles.   Cardiovascular: RRR, heart sounds normal, no murmur or gallops, no peripheral edema  Musculoskeletal: L BKA, no cyanosis or clubbing  Neuro: alert, non focal  Skin: Warm, dry and scaly legs, no lesions or rash       Assessment & Plan:  COPD (chronic obstructive pulmonary disease) (HCC) Never smoker but he has severe obstruction on spirometry was done when he was admitted to the hospital in the past for coronary disease.  In fact this is what precluded him from undergoing CABG.  He is not currently on bronchodilators.  He denies any bronchitic symptoms or wheezing.  I think he needs repeat pulmonary function testing to better define his airflows.  His dyspnea may be largely related more to restriction and obstruction.  If he does have obstruction then a trial of bronchodilators would be warranted.  Pulmonary embolism (HCC) Remote pulmonary embolism with an IVC filter.  He is now on Eliquis and needs to be on anticoagulation for life as long as there is no  contraindication identified.  OBSTRUCTIVE SLEEP APNEA Diagnosed remotely.  He tolerated CPAP for about 9 months and then stopped it.  He then reports a repeat PSG in [place] that apparently did not confirm sleep apnea.  Based on his family's description and observations I am suspicious that he does still have sleep disordered breathing.  He is not interested in a sleep study or CPAP right now.  He may be willing to reconsider as we go forward.  DOE (dyspnea on exertion) Suspect at least a component of restriction due to his obesity, deconditioning.  He may also have obstruction based on his remote spirometry.  I think we need to confirm with repeat pulmonary function testing if so then we will do a trial of bronchodilators.  He is at high risk for pulmonary hypertension given his possibly untreated obstructive sleep apnea.  We will need to evaluate this as we go forward.  Levy Pupaobert Ishmeal Rorie, MD, PhD 01/24/2018, 4:09 PM [facility] [phone] or if no answer [phone]

## 2018-01-27 ENCOUNTER — Ambulatory Visit: Payer: Self-pay | Admitting: Pharmacist

## 2018-01-27 ENCOUNTER — Other Ambulatory Visit: Payer: Self-pay | Admitting: Pharmacy Technician

## 2018-01-27 ENCOUNTER — Other Ambulatory Visit: Payer: Self-pay | Admitting: Pharmacist

## 2018-01-27 ENCOUNTER — Ambulatory Visit: Payer: PPO | Admitting: Emergency Medicine

## 2018-01-27 NOTE — Patient Outreach (Signed)
[facility]) Care Management  01/27/2018  Samuel BusingJohn D [name] [birth year] [id number]  Patient was called regarding medication assistance. Unfortunately, patient did not answer the phone. Patient has been called multiple times unsuccessfully and due to the complexity of his disease he is now being followed by [facility].  [facility] was staying on board because the patient has had continual issues with medication affordability and we have been working on medication assistance.   Patient has been sent multiple applications to help him obtain insulin at no charge but he has not returned them.  Patient said he returned them to [employer] but the company said they do not have an application for the patient.   Plan: Patient will be sent a final application for Humalog and Basaglar (substituted for Levemir). He will be called for an additional time in 10-14 days. If patient cannot be reached at that time, his case will be closed.   Beecher McardleKatina J. Ardra Kuznicki, PharmD, BCACP [facility] Clinical Pharmacist [phone]

## 2018-01-27 NOTE — Patient Outreach (Signed)
[facility]) Care Management  01/27/2018  Beckie BusingJohn D Cuen [birth year] [id number]   Received request from [facility] Nunzio CobbsKatina Boyd that another [facility] application be mailed to patient for his Humalog and Hospital doctorBasaglar. Prepared application to be mailed.  Suzan SlickAshley N. Ernesta Ambleoleman, CPhT [facility] Care Management [phone]

## 2018-02-03 ENCOUNTER — Encounter (HOSPITAL_COMMUNITY): Payer: Self-pay | Admitting: Obstetrics and Gynecology

## 2018-02-03 ENCOUNTER — Other Ambulatory Visit: Payer: Self-pay

## 2018-02-03 ENCOUNTER — Observation Stay (HOSPITAL_COMMUNITY): Payer: PPO

## 2018-02-03 ENCOUNTER — Emergency Department (HOSPITAL_COMMUNITY)
Admission: EM | Admit: 2018-02-03 | Discharge: 2018-02-03 | Disposition: A | Discharge status: Home / Self Care | Payer: PPO | Source: Home / Self Care | Attending: Emergency Medicine | Admitting: Emergency Medicine

## 2018-02-03 ENCOUNTER — Observation Stay (HOSPITAL_COMMUNITY)
Admission: EM | Admit: 2018-02-03 | Discharge: 2018-02-04 | Disposition: A | Discharge status: Home / Self Care | Payer: PPO | Attending: Internal Medicine | Admitting: Internal Medicine

## 2018-02-03 ENCOUNTER — Encounter (HOSPITAL_COMMUNITY): Payer: Self-pay | Admitting: Emergency Medicine

## 2018-02-03 DIAGNOSIS — E785 Hyperlipidemia, unspecified: Secondary | ICD-10-CM | POA: Diagnosis not present

## 2018-02-03 DIAGNOSIS — K3184 Gastroparesis: Secondary | ICD-10-CM | POA: Insufficient documentation

## 2018-02-03 DIAGNOSIS — E1111 Type 2 diabetes mellitus with ketoacidosis with coma: Secondary | ICD-10-CM | POA: Diagnosis not present

## 2018-02-03 DIAGNOSIS — I252 Old myocardial infarction: Secondary | ICD-10-CM | POA: Diagnosis not present

## 2018-02-03 DIAGNOSIS — J80 Acute respiratory distress syndrome: Secondary | ICD-10-CM | POA: Diagnosis not present

## 2018-02-03 DIAGNOSIS — I10 Essential (primary) hypertension: Secondary | ICD-10-CM | POA: Insufficient documentation

## 2018-02-03 DIAGNOSIS — Z66 Do not resuscitate: Secondary | ICD-10-CM | POA: Diagnosis not present

## 2018-02-03 DIAGNOSIS — I2782 Chronic pulmonary embolism: Secondary | ICD-10-CM

## 2018-02-03 DIAGNOSIS — E1143 Type 2 diabetes mellitus with diabetic autonomic (poly)neuropathy: Secondary | ICD-10-CM | POA: Insufficient documentation

## 2018-02-03 DIAGNOSIS — J449 Chronic obstructive pulmonary disease, unspecified: Secondary | ICD-10-CM | POA: Insufficient documentation

## 2018-02-03 DIAGNOSIS — Z89512 Acquired absence of left leg below knee: Secondary | ICD-10-CM | POA: Diagnosis not present

## 2018-02-03 DIAGNOSIS — Z955 Presence of coronary angioplasty implant and graft: Secondary | ICD-10-CM

## 2018-02-03 DIAGNOSIS — Z79899 Other long term (current) drug therapy: Secondary | ICD-10-CM | POA: Insufficient documentation

## 2018-02-03 DIAGNOSIS — E1110 Type 2 diabetes mellitus with ketoacidosis without coma: Secondary | ICD-10-CM | POA: Insufficient documentation

## 2018-02-03 DIAGNOSIS — E669 Obesity, unspecified: Secondary | ICD-10-CM | POA: Diagnosis not present

## 2018-02-03 DIAGNOSIS — Z7982 Long term (current) use of aspirin: Secondary | ICD-10-CM | POA: Insufficient documentation

## 2018-02-03 DIAGNOSIS — Z6833 Body mass index (BMI) 33.0-33.9, adult: Secondary | ICD-10-CM | POA: Diagnosis not present

## 2018-02-03 DIAGNOSIS — G43A0 Cyclical vomiting, not intractable: Secondary | ICD-10-CM

## 2018-02-03 DIAGNOSIS — R110 Nausea: Secondary | ICD-10-CM | POA: Diagnosis not present

## 2018-02-03 DIAGNOSIS — Z89519 Acquired absence of unspecified leg below knee: Secondary | ICD-10-CM

## 2018-02-03 DIAGNOSIS — R0602 Shortness of breath: Secondary | ICD-10-CM | POA: Diagnosis not present

## 2018-02-03 DIAGNOSIS — Z86711 Personal history of pulmonary embolism: Secondary | ICD-10-CM | POA: Insufficient documentation

## 2018-02-03 DIAGNOSIS — Z794 Long term (current) use of insulin: Secondary | ICD-10-CM | POA: Insufficient documentation

## 2018-02-03 DIAGNOSIS — E1151 Type 2 diabetes mellitus with diabetic peripheral angiopathy without gangrene: Secondary | ICD-10-CM | POA: Insufficient documentation

## 2018-02-03 DIAGNOSIS — E860 Dehydration: Secondary | ICD-10-CM | POA: Insufficient documentation

## 2018-02-03 DIAGNOSIS — R739 Hyperglycemia, unspecified: Secondary | ICD-10-CM

## 2018-02-03 DIAGNOSIS — R001 Bradycardia, unspecified: Secondary | ICD-10-CM | POA: Diagnosis not present

## 2018-02-03 DIAGNOSIS — IMO0002 Reserved for concepts with insufficient information to code with codable children: Secondary | ICD-10-CM | POA: Diagnosis present

## 2018-02-03 DIAGNOSIS — R112 Nausea with vomiting, unspecified: Secondary | ICD-10-CM | POA: Diagnosis present

## 2018-02-03 DIAGNOSIS — Z7901 Long term (current) use of anticoagulants: Secondary | ICD-10-CM | POA: Insufficient documentation

## 2018-02-03 DIAGNOSIS — G4733 Obstructive sleep apnea (adult) (pediatric): Secondary | ICD-10-CM | POA: Insufficient documentation

## 2018-02-03 DIAGNOSIS — Z7902 Long term (current) use of antithrombotics/antiplatelets: Secondary | ICD-10-CM

## 2018-02-03 DIAGNOSIS — E1165 Type 2 diabetes mellitus with hyperglycemia: Secondary | ICD-10-CM

## 2018-02-03 DIAGNOSIS — E1136 Type 2 diabetes mellitus with diabetic cataract: Secondary | ICD-10-CM | POA: Insufficient documentation

## 2018-02-03 DIAGNOSIS — K219 Gastro-esophageal reflux disease without esophagitis: Secondary | ICD-10-CM | POA: Insufficient documentation

## 2018-02-03 DIAGNOSIS — Z9861 Coronary angioplasty status: Secondary | ICD-10-CM

## 2018-02-03 DIAGNOSIS — I2510 Atherosclerotic heart disease of native coronary artery without angina pectoris: Secondary | ICD-10-CM

## 2018-02-03 DIAGNOSIS — E1169 Type 2 diabetes mellitus with other specified complication: Secondary | ICD-10-CM | POA: Insufficient documentation

## 2018-02-03 DIAGNOSIS — J9811 Atelectasis: Secondary | ICD-10-CM | POA: Diagnosis not present

## 2018-02-03 DIAGNOSIS — R1115 Cyclical vomiting syndrome unrelated to migraine: Secondary | ICD-10-CM

## 2018-02-03 DIAGNOSIS — E11610 Type 2 diabetes mellitus with diabetic neuropathic arthropathy: Secondary | ICD-10-CM | POA: Diagnosis not present

## 2018-02-03 DIAGNOSIS — I2699 Other pulmonary embolism without acute cor pulmonale: Secondary | ICD-10-CM | POA: Diagnosis present

## 2018-02-03 DIAGNOSIS — E119 Type 2 diabetes mellitus without complications: Secondary | ICD-10-CM

## 2018-02-03 LAB — BLOOD GAS, VENOUS
Acid-base deficit: 3.1 mmol/L — ABNORMAL HIGH (ref 0.0–2.0)
Bicarbonate: 21 mmol/L (ref 20.0–28.0)
O2 SAT: 65.9 %
PATIENT TEMPERATURE: 98.6
PH VEN: 7.376 (ref 7.250–7.430)
PO2 VEN: 36.9 mmHg (ref 32.0–45.0)
pCO2, Ven: 36.7 mmHg — ABNORMAL LOW (ref 44.0–60.0)

## 2018-02-03 LAB — COMPREHENSIVE METABOLIC PANEL
ALK PHOS: 95 U/L (ref 38–126)
ALT: 20 U/L (ref 0–44)
AST: 21 U/L (ref 15–41)
Albumin: 3.6 g/dL (ref 3.5–5.0)
Anion gap: 20 — ABNORMAL HIGH (ref 5–15)
BILIRUBIN TOTAL: 1.7 mg/dL — AB (ref 0.3–1.2)
BUN: 18 mg/dL (ref 8–23)
CALCIUM: 9 mg/dL (ref 8.9–10.3)
CO2: 20 mmol/L — ABNORMAL LOW (ref 22–32)
CREATININE: 1.2 mg/dL (ref 0.61–1.24)
Chloride: 99 mmol/L (ref 98–111)
GFR calc non Af Amer: >60 mL/min (ref >60)
Glucose, Bld: 371 mg/dL — ABNORMAL HIGH (ref 70–99)
Potassium: 4.8 mmol/L (ref 3.5–5.1)
Sodium: 139 mmol/L (ref 135–145)
Total Protein: 7.6 g/dL (ref 6.5–8.1)

## 2018-02-03 LAB — CBC WITH DIFFERENTIAL/PLATELET
BASOS ABS: 0 10*3/uL (ref 0.0–0.1)
BASOS ABS: 0 10*3/uL (ref 0.0–0.1)
BASOS PCT: 0 %
Basophils Relative: 0 %
EOS ABS: 0 10*3/uL (ref 0.0–0.7)
EOS PCT: 1 %
EOS PCT: 0 %
Eosinophils Absolute: 0.1 10*3/uL (ref 0.0–0.7)
HCT: 43.2 % (ref 39.0–52.0)
HCT: 44.2 % (ref 39.0–52.0)
Hemoglobin: 15.1 g/dL (ref 13.0–17.0)
Hemoglobin: 15 g/dL (ref 13.0–17.0)
LYMPHS PCT: 9 %
Lymphocytes Relative: 9 %
Lymphs Abs: 0.9 10*3/uL (ref 0.7–4.0)
Lymphs Abs: 0.7 10*3/uL (ref 0.7–4.0)
MCH: 31.5 pg (ref 26.0–34.0)
MCH: 30.5 pg (ref 26.0–34.0)
MCHC: 35 g/dL (ref 30.0–36.0)
MCHC: 33.9 g/dL (ref 30.0–36.0)
MCV: 90 fL (ref 78.0–100.0)
MCV: 89.8 fL (ref 78.0–100.0)
MONO ABS: 0.4 10*3/uL (ref 0.1–1.0)
MONO ABS: 0.4 10*3/uL (ref 0.1–1.0)
MONOS PCT: 5 %
Monocytes Relative: 4 %
Neutro Abs: 8 10*3/uL — ABNORMAL HIGH (ref 1.7–7.7)
Neutro Abs: 6.8 10*3/uL (ref 1.7–7.7)
Neutrophils Relative %: 85 %
Neutrophils Relative %: 87 %
PLATELETS: 237 10*3/uL (ref 150–400)
PLATELETS: 262 10*3/uL (ref 150–400)
RBC: 4.8 MIL/uL (ref 4.22–5.81)
RBC: 4.92 MIL/uL (ref 4.22–5.81)
RDW: 12.3 % (ref 11.5–15.5)
RDW: 12.4 % (ref 11.5–15.5)
WBC: 9.4 10*3/uL (ref 4.0–10.5)
WBC: 7.9 10*3/uL (ref 4.0–10.5)

## 2018-02-03 LAB — URINALYSIS, ROUTINE W REFLEX MICROSCOPIC
BACTERIA UA: NONE SEEN
Bilirubin Urine: NEGATIVE
KETONES UR: 80 mg/dL — AB
Leukocytes, UA: NEGATIVE
NITRITE: NEGATIVE
PH: 5 (ref 5.0–8.0)
Protein, ur: 30 mg/dL — AB
SPECIFIC GRAVITY, URINE: 1.027 (ref 1.005–1.030)

## 2018-02-03 LAB — CBG MONITORING, ED
GLUCOSE-CAPILLARY: 321 mg/dL — AB (ref 70–99)
Glucose-Capillary: 319 mg/dL — ABNORMAL HIGH (ref 70–99)
Glucose-Capillary: 273 mg/dL — ABNORMAL HIGH (ref 70–99)
Glucose-Capillary: 254 mg/dL — ABNORMAL HIGH (ref 70–99)

## 2018-02-03 LAB — BASIC METABOLIC PANEL
ANION GAP: 17 — AB (ref 5–15)
ANION GAP: 14 (ref 5–15)
ANION GAP: 11 (ref 5–15)
BUN: 17 mg/dL (ref 8–23)
BUN: 16 mg/dL (ref 8–23)
BUN: 15 mg/dL (ref 8–23)
CALCIUM: 9.2 mg/dL (ref 8.9–10.3)
CHLORIDE: 105 mmol/L (ref 98–111)
CO2: 22 mmol/L (ref 22–32)
CO2: 22 mmol/L (ref 22–32)
CO2: 23 mmol/L (ref 22–32)
CREATININE: 1.03 mg/dL (ref 0.61–1.24)
Calcium: 8.6 mg/dL — ABNORMAL LOW (ref 8.9–10.3)
Calcium: 8.5 mg/dL — ABNORMAL LOW (ref 8.9–10.3)
Chloride: 98 mmol/L (ref 98–111)
Chloride: 103 mmol/L (ref 98–111)
Creatinine, Ser: 1.04 mg/dL (ref 0.61–1.24)
Creatinine, Ser: 0.98 mg/dL (ref 0.61–1.24)
GFR calc Af Amer: >60 mL/min (ref >60)
GFR calc Af Amer: >60 mL/min (ref >60)
GFR calc non Af Amer: >60 mL/min (ref >60)
GFR calc non Af Amer: >60 mL/min (ref >60)
GLUCOSE: 254 mg/dL — AB (ref 70–99)
GLUCOSE: 163 mg/dL — AB (ref 70–99)
Glucose, Bld: 384 mg/dL — ABNORMAL HIGH (ref 70–99)
POTASSIUM: 4.1 mmol/L (ref 3.5–5.1)
POTASSIUM: 3.6 mmol/L (ref 3.5–5.1)
Potassium: 4.2 mmol/L (ref 3.5–5.1)
Sodium: 137 mmol/L (ref 135–145)
Sodium: 139 mmol/L (ref 135–145)
Sodium: 139 mmol/L (ref 135–145)

## 2018-02-03 LAB — LIPASE, BLOOD
LIPASE: 19 U/L (ref 11–51)
Lipase: 20 U/L (ref 11–51)

## 2018-02-03 LAB — GLUCOSE, CAPILLARY
GLUCOSE-CAPILLARY: 171 mg/dL — AB (ref 70–99)
Glucose-Capillary: 222 mg/dL — ABNORMAL HIGH (ref 70–99)
Glucose-Capillary: 195 mg/dL — ABNORMAL HIGH (ref 70–99)
Glucose-Capillary: 165 mg/dL — ABNORMAL HIGH (ref 70–99)
Glucose-Capillary: 142 mg/dL — ABNORMAL HIGH (ref 70–99)

## 2018-02-03 LAB — URINALYSIS, COMPLETE (UACMP) WITH MICROSCOPIC
BACTERIA UA: NONE SEEN
Bilirubin Urine: NEGATIVE
Glucose, UA: >=500 mg/dL — AB
Ketones, ur: 80 mg/dL — AB
Leukocytes, UA: NEGATIVE
Nitrite: NEGATIVE
Protein, ur: 30 mg/dL — AB
SPECIFIC GRAVITY, URINE: 1.026 (ref 1.005–1.030)
pH: 5 (ref 5.0–8.0)

## 2018-02-03 LAB — BETA-HYDROXYBUTYRIC ACID: Beta-Hydroxybutyric Acid: 4.43 mmol/L — ABNORMAL HIGH (ref 0.05–0.27)

## 2018-02-03 LAB — MRSA PCR SCREENING: MRSA by PCR: NEGATIVE

## 2018-02-03 LAB — HEMOGLOBIN A1C
HEMOGLOBIN A1C: 10.9 % — AB (ref 4.8–5.6)
MEAN PLASMA GLUCOSE: 266.13 mg/dL

## 2018-02-03 MED ORDER — SODIUM CHLORIDE 0.9 % IV BOLUS
1000.0000 mL | Freq: Once | INTRAVENOUS | Status: AC
  Administered 2018-02-03: 1000 mL via INTRAVENOUS

## 2018-02-03 MED ORDER — POTASSIUM CHLORIDE 10 MEQ/100ML IV SOLN
10.0000 meq | INTRAVENOUS | Status: AC
  Administered 2018-02-03 (×2): 10 meq via INTRAVENOUS
  Filled 2018-02-03 (×2): qty 100

## 2018-02-03 MED ORDER — PANTOPRAZOLE SODIUM 40 MG PO TBEC
40.0000 mg | DELAYED_RELEASE_TABLET | Freq: Every day | ORAL | Status: DC
  Administered 2018-02-03 – 2018-02-04 (×2): 40 mg via ORAL
  Filled 2018-02-03 (×2): qty 1

## 2018-02-03 MED ORDER — RANOLAZINE ER 500 MG PO TB12
500.0000 mg | ORAL_TABLET | Freq: Two times a day (BID) | ORAL | Status: DC
  Administered 2018-02-03 – 2018-02-04 (×2): 500 mg via ORAL
  Filled 2018-02-03 (×2): qty 1

## 2018-02-03 MED ORDER — NITROGLYCERIN 0.4 MG SL SUBL: 0.4000 mg | SUBLINGUAL_TABLET | SUBLINGUAL | Status: DC | PRN

## 2018-02-03 MED ORDER — SODIUM CHLORIDE 0.9 % IV SOLN
INTRAVENOUS | Status: DC
  Filled 2018-02-03: qty 1

## 2018-02-03 MED ORDER — ASPIRIN EC 81 MG PO TBEC
81.0000 mg | DELAYED_RELEASE_TABLET | Freq: Every day | ORAL | Status: DC
  Administered 2018-02-03 – 2018-02-04 (×2): 81 mg via ORAL
  Filled 2018-02-03 (×2): qty 1

## 2018-02-03 MED ORDER — LACTATED RINGERS IV BOLUS
1000.0000 mL | Freq: Once | INTRAVENOUS | Status: AC
  Administered 2018-02-03: 1000 mL via INTRAVENOUS

## 2018-02-03 MED ORDER — PROMETHAZINE HCL 25 MG/ML IJ SOLN
25.0000 mg | Freq: Once | INTRAMUSCULAR | Status: AC
  Administered 2018-02-03: 25 mg via INTRAMUSCULAR
  Filled 2018-02-03: qty 1

## 2018-02-03 MED ORDER — FERROUS SULFATE 325 (65 FE) MG PO TABS
325.0000 mg | ORAL_TABLET | Freq: Every evening | ORAL | Status: DC
  Administered 2018-02-03: 325 mg via ORAL
  Filled 2018-02-03: qty 1

## 2018-02-03 MED ORDER — TRAMADOL HCL 50 MG PO TABS
100.0000 mg | ORAL_TABLET | Freq: Four times a day (QID) | ORAL | Status: DC | PRN
  Administered 2018-02-03 – 2018-02-04 (×2): 100 mg via ORAL
  Filled 2018-02-03 (×2): qty 2

## 2018-02-03 MED ORDER — SODIUM CHLORIDE 0.9 % IV BOLUS (SEPSIS)
1000.0000 mL | Freq: Once | INTRAVENOUS | Status: AC
  Administered 2018-02-03: 1000 mL via INTRAVENOUS

## 2018-02-03 MED ORDER — APIXABAN 5 MG PO TABS
5.0000 mg | ORAL_TABLET | Freq: Two times a day (BID) | ORAL | Status: DC
  Administered 2018-02-03 – 2018-02-04 (×2): 5 mg via ORAL
  Filled 2018-02-03 (×2): qty 1

## 2018-02-03 MED ORDER — ONDANSETRON HCL 4 MG/2ML IJ SOLN
4.0000 mg | Freq: Four times a day (QID) | INTRAMUSCULAR | Status: DC | PRN
  Administered 2018-02-03 – 2018-02-04 (×2): 4 mg via INTRAVENOUS
  Filled 2018-02-03 (×2): qty 2

## 2018-02-03 MED ORDER — POTASSIUM CHLORIDE 10 MEQ/100ML IV SOLN: 10.0000 meq | INTRAVENOUS | Status: DC

## 2018-02-03 MED ORDER — SODIUM CHLORIDE 0.9 % IV SOLN: INTRAVENOUS | Status: AC

## 2018-02-03 MED ORDER — ATORVASTATIN CALCIUM 40 MG PO TABS
80.0000 mg | ORAL_TABLET | Freq: Every day | ORAL | Status: DC
  Administered 2018-02-03: 80 mg via ORAL
  Filled 2018-02-03: qty 2

## 2018-02-03 MED ORDER — DIPHENHYDRAMINE HCL 50 MG/ML IJ SOLN
25.0000 mg | Freq: Once | INTRAMUSCULAR | Status: AC
  Administered 2018-02-03: 25 mg via INTRAVENOUS
  Filled 2018-02-03: qty 1

## 2018-02-03 MED ORDER — DEXTROSE-NACL 5-0.45 % IV SOLN: INTRAVENOUS | Status: DC

## 2018-02-03 MED ORDER — SODIUM CHLORIDE 0.9 % IV SOLN
INTRAVENOUS | Status: DC
  Administered 2018-02-03: 2.6 [IU]/h via INTRAVENOUS
  Administered 2018-02-03: 5.4 [IU]/h via INTRAVENOUS
  Filled 2018-02-03: qty 1

## 2018-02-03 MED ORDER — CLOPIDOGREL BISULFATE 75 MG PO TABS
75.0000 mg | ORAL_TABLET | Freq: Every day | ORAL | Status: DC
  Administered 2018-02-03 – 2018-02-04 (×2): 75 mg via ORAL
  Filled 2018-02-03 (×2): qty 1

## 2018-02-03 MED ORDER — METOPROLOL TARTRATE 25 MG PO TABS
25.0000 mg | ORAL_TABLET | Freq: Two times a day (BID) | ORAL | Status: DC
  Administered 2018-02-03 – 2018-02-04 (×2): 25 mg via ORAL
  Filled 2018-02-03 (×2): qty 1

## 2018-02-03 MED ORDER — LACTATED RINGERS IV SOLN
INTRAVENOUS | Status: DC
  Administered 2018-02-03: 16:00:00 via INTRAVENOUS

## 2018-02-03 MED ORDER — SODIUM CHLORIDE 0.9 % IV SOLN
INTRAVENOUS | Status: DC
  Administered 2018-02-03: 20:00:00 via INTRAVENOUS

## 2018-02-03 MED ORDER — METOCLOPRAMIDE HCL 5 MG/ML IJ SOLN
10.0000 mg | Freq: Once | INTRAMUSCULAR | Status: AC
  Administered 2018-02-03: 10 mg via INTRAVENOUS
  Filled 2018-02-03: qty 2

## 2018-02-03 MED ORDER — GABAPENTIN 300 MG PO CAPS
300.0000 mg | ORAL_CAPSULE | Freq: Four times a day (QID) | ORAL | Status: DC
  Administered 2018-02-03 – 2018-02-04 (×3): 300 mg via ORAL
  Filled 2018-02-03 (×3): qty 1

## 2018-02-03 MED ORDER — DEXTROSE-NACL 5-0.45 % IV SOLN
INTRAVENOUS | Status: DC
  Administered 2018-02-03: 21:00:00 via INTRAVENOUS

## 2018-02-03 MED ORDER — AMLODIPINE BESYLATE 10 MG PO TABS
10.0000 mg | ORAL_TABLET | Freq: Every day | ORAL | Status: DC
  Administered 2018-02-03 – 2018-02-04 (×2): 10 mg via ORAL
  Filled 2018-02-03 (×2): qty 1

## 2018-02-03 MED ORDER — FAMOTIDINE 20 MG PO TABS
20.0000 mg | ORAL_TABLET | Freq: Two times a day (BID) | ORAL | Status: DC
  Administered 2018-02-03 – 2018-02-04 (×2): 20 mg via ORAL
  Filled 2018-02-03 (×2): qty 1

## 2018-02-03 NOTE — ED Triage Notes (Addendum)
Pt arriving via [facility] from home for N/V since midnight after receiving an injection of Phenergan. No episodes of emesis witnessed by EMS.   4mg  Zofran given by EMS

## 2018-02-03 NOTE — ED Notes (Signed)
ED TO INPATIENT HANDOFF REPORT  Name/Age/Gender Samuel [name] 67 y.o. male  Code Status Code Status History    Date Active Date Inactive Code Status Order ID Comments User Context   12/31/2017 4742 01/02/2018 1452 DNR [id number]  Cristal Ford, DO Inpatient   09/22/2017 0316 09/24/2017 1501 DNR [id number]  Toy Baker, MD Inpatient   09/22/2017 0316 09/22/2017 0316 DNR [id number]  Toy Baker, MD Inpatient   04/11/2017 1928 04/11/2017 2250 DNR [id number]  Kayleen Memos, DO Inpatient   04/10/2017 1512 04/11/2017 1928 DNR [id number]  Aline August, MD Inpatient   04/07/2017 1824 04/10/2017 1512 Full Code [id number]  Phillips Grout, MD Inpatient   11/19/2016 0050 11/21/2016 1721 Full Code [id number]  Bethena Roys, MD Inpatient   03/08/2015 1533 03/15/2015 1707 Full Code [id number]  Corky Sing, PA-C Inpatient   03/07/2015 0734 03/08/2015 1533 DNR [id number]  Rise Patience, MD Inpatient   01/07/2014 1951 01/11/2014 1833 Full Code [id number]  Wylene Simmer, MD Inpatient   12/21/2013 0229 12/23/2013 1822 Full Code [id number]  Theressa Millard, MD Inpatient    Questions for Most Recent Historical Code Status (Order [id number])    Question Answer Comment   In the event of cardiac or respiratory ARREST Do not call a "code blue"    In the event of cardiac or respiratory ARREST Do not perform Intubation, CPR, defibrillation or ACLS    In the event of cardiac or respiratory ARREST Use medication by any route, position, wound care, and other measures to relive pain and suffering. May use oxygen, suction and manual treatment of airway obstruction as needed for comfort.         Advance Directive Documentation     Most Recent Value  Type of Advance Directive  Living will, Out of facility DNR (pink MOST or yellow form)  Pre-existing out of facility DNR order (yellow form or pink MOST form)  Yellow form placed in chart (order not valid for inpatient use)  "MOST" Form in Place?  -       Home/SNF/Other Home  Chief Complaint nausea  Level of Care/Admitting Diagnosis ED Disposition    ED Disposition Condition [place]: [place] [100102]  Level of Care: Stepdown [14]  Admit to SDU based on following criteria: Hemodynamic compromise or significant risk of instability:  Patient requiring short term acute titration and management of vasoactive drips, and invasive monitoring (i.e., CVP and Arterial line).  Admit to SDU based on following criteria: Severe physiological/psychological symptoms:  Any diagnosis requiring assessment & intervention at least every 4 hours on an ongoing basis to obtain desired patient outcomes including stability and rehabilitation  Diagnosis: DKA, type 2 [facility]) [510258]  Admitting Physician: [place], [place] [[id number]]  Attending Physician: [name] LATIF [[id number]]  PT Class (Do Not Modify): Observation [104]  PT Acc Code (Do Not Modify): Observation [10022]       Medical History Past Medical History:  Diagnosis Date  . Anxiety   . Arthritis    "all over"   . CAD (coronary artery disease)   . Charcot's joint    "left foot"  . Charcot's joint disease due to secondary diabetes ([name])   . Depression   . Gastroparesis   . GERD (gastroesophageal reflux disease)   . H/O hiatal hernia   . Hyperlipidemia   . Hypertension   . Myocardial infarction ([place]) 2017/03/27   around this date  . OSA (obstructive sleep apnea)    "  not bad enough for a mask"  . Peripheral neuropathy   . Peripheral vascular disease ([place])   . PONV (postoperative nausea and vomiting)   . Pulmonary embolism ([place])    hx. of 2012  . Shortness of breath    exertion  . Type II diabetes mellitus (HCC)     Allergies Allergies  Allergen Reactions  . Gabapentin Hives, Itching and Rash    REACTION: rash/hives (per patient, was a reaction to an inactive ingredient in another mgf brand). The patient stated that he does take  this medication now and it doesn't cause a rash any more.  . Nabumetone Itching, Nausea Only and Rash  . Reglan [Metoclopramide] Hives and Nausea Only  . Codeine Other (See Comments)    GI UPSET - hives    IV Location/Drains/Wounds Patient Lines/Drains/Airways Status   Active Line/Drains/Airways    Name:   Placement date:   Placement time:   Site:   Days:   Peripheral IV 02/03/18 Left Forearm   02/03/18    1305    Forearm   less than 1   Wound / Incision (Open or Dehisced) 12/31/17 Non-pressure wound;Other (Comment) Pretibial Right;Lower;Other (Comment) SUPERFICIAL Abrasions from running into a wheel chair, 2 small scabbed area . Foam pad placed   12/31/17    1449    Pretibial   34          Labs/Imaging Results for orders placed or performed during the hospital encounter of 02/03/18 (from the past 48 hour(s))  Urinalysis, Routine w reflex microscopic     Status: Abnormal   Collection Time: 02/03/18 11:25 AM  Result Value Ref Range   Color, Urine YELLOW YELLOW   APPearance CLEAR CLEAR   Specific Gravity, Urine 1.027 1.005 - 1.030   pH 5.0 5.0 - 8.0   Glucose, UA >=500 (A) NEGATIVE mg/dL   Hgb urine dipstick SMALL (A) NEGATIVE   Bilirubin Urine NEGATIVE NEGATIVE   Ketones, ur 80 (A) NEGATIVE mg/dL   Protein, ur 30 (A) NEGATIVE mg/dL   Nitrite NEGATIVE NEGATIVE   Leukocytes, UA NEGATIVE NEGATIVE   RBC / HPF 0-5 0 - 5 RBC/hpf   Bacteria, UA NONE SEEN NONE SEEN   Mucus PRESENT     Comment: Performed at [facility], [place] [address] [place],  [address]  CBG monitoring, ED     Status: Abnormal   Collection Time: 02/03/18 11:26 AM  Result Value Ref Range   Glucose-Capillary 319 (H) 70 - 99 mg/dL  Comprehensive metabolic panel     Status: Abnormal   Collection Time: 02/03/18 12:44 PM  Result Value Ref Range   Sodium 139 135 - 145 mmol/L   Potassium 4.8 3.5 - 5.1 mmol/L   Chloride 99 98 - 111 mmol/L   CO2 20 (L) 22 - 32 mmol/L   Glucose, Bld 371 (H) 70  - 99 mg/dL   BUN 18 8 - 23 mg/dL   Creatinine, Ser 1.20 0.61 - 1.24 mg/dL   Calcium 9.0 8.9 - 10.3 mg/dL   Total Protein 7.6 6.5 - 8.1 g/dL   Albumin 3.6 3.5 - 5.0 g/dL   AST 21 15 - 41 U/L   ALT 20 0 - 44 U/L   Alkaline Phosphatase 95 38 - 126 U/L   Total Bilirubin 1.7 (H) 0.3 - 1.2 mg/dL   GFR calc non Af Amer >60 >60 mL/min   GFR calc Af Amer >60 >60 mL/min    Comment: (NOTE) The eGFR  has been calculated using the CKD EPI equation. This calculation has not been validated in all clinical situations. eGFR's persistently <60 mL/min signify possible Chronic Kidney Disease.    Anion gap 20 (H) 5 - 15    Comment: Performed at [facility], [place] [address], [place], [place] [address]  Lipase, blood     Status: None   Collection Time: 02/03/18 12:44 PM  Result Value Ref Range   Lipase 19 11 - 51 U/L    Comment: Performed at [facility], [place] [address], [place], [place] [address]  CBC with Differential     Status: None   Collection Time: 02/03/18 12:44 PM  Result Value Ref Range   WBC 7.9 4.0 - 10.5 K/uL   RBC 4.92 4.22 - 5.81 MIL/uL   Hemoglobin 15.0 13.0 - 17.0 g/dL   HCT 44.2 39.0 - 52.0 %   MCV 89.8 78.0 - 100.0 fL   MCH 30.5 26.0 - 34.0 pg   MCHC 33.9 30.0 - 36.0 g/dL   RDW 12.4 11.5 - 15.5 %   Platelets 262 150 - 400 K/uL   Neutrophils Relative % 87 %   Neutro Abs 6.8 1.7 - 7.7 K/uL   Lymphocytes Relative 9 %   Lymphs Abs 0.7 0.7 - 4.0 K/uL   Monocytes Relative 4 %   Monocytes Absolute 0.4 0.1 - 1.0 K/uL   Eosinophils Relative 0 %   Eosinophils Absolute 0.0 0.0 - 0.7 K/uL   Basophils Relative 0 %   Basophils Absolute 0.0 0.0 - 0.1 K/uL    Comment: Performed at [facility], [place] [address] [place], [place] [address]  Blood gas, venous     Status: Abnormal   Collection Time: 02/03/18 12:55 PM  Result Value Ref Range   pH, Ven 7.376 7.250 - 7.430   pCO2, Ven 36.7 (L) 44.0 - 60.0 mmHg   pO2, Ven 36.9 32.0 -  45.0 mmHg   Bicarbonate 21.0 20.0 - 28.0 mmol/L   Acid-base deficit 3.1 (H) 0.0 - 2.0 mmol/L   O2 Saturation 65.9 %   Patient temperature 98.6    Collection site VEIN    Drawn by DRAWN BY RN    Sample type VEIN     Comment: Performed at [facility], [place] [address] [place], [place] [address]  CBG monitoring, ED     Status: Abnormal   Collection Time: 02/03/18  2:38 PM  Result Value Ref Range   Glucose-Capillary 321 (H) 70 - 99 mg/dL  CBG monitoring, ED     Status: Abnormal   Collection Time: 02/03/18  3:52 PM  Result Value Ref Range   Glucose-Capillary 273 (H) 70 - 99 mg/dL  CBG monitoring, ED     Status: Abnormal   Collection Time: 02/03/18  5:12 PM  Result Value Ref Range   Glucose-Capillary 254 (H) 70 - 99 mg/dL   Comment 1 Notify RN    No results found.  Pending Labs [employer] (From admission, onward)    Start     Ordered   Signed and Corporate treasurer  STAT Now then every 4 hours ,   STAT     Signed and Held   Signed and Held  Beta-hydroxybutyric acid  Add-on,   R     Signed and Held   Signed and Held  Culture, blood (routine x 2)  BLOOD CULTURE X 2,   R     Signed  and Held   Signed and Held  Urinalysis, Complete w Microscopic  Once,   R     Signed and Held   Signed and Held  Urine culture  Once,   R     Signed and Held   Signed and Held  Hemoglobin A1c  Add-on,   R     Signed and Held          Vitals/Pain Today's Vitals   02/03/18 1401 02/03/18 1430 02/03/18 1530 02/03/18 1600  BP:  (!) 138/56 (!) 157/85 (!) 147/72  Pulse: 93 91 96 (!) 101  Resp: 16 20 (!) 21 15  Temp:      TempSrc:      SpO2: 95% 95% 99% 97%  Weight:      Height:      PainSc:        Isolation Precautions No active isolations  Medications Medications  insulin regular (NOVOLIN R,HUMULIN R) 100 Units in sodium chloride 0.9 % 100 mL (1 Units/mL) infusion (4.3 Units/hr Intravenous Rate/Dose Verify 02/03/18 1601)  dextrose 5 %-0.45 % sodium chloride  infusion ( Intravenous Hold 02/03/18 1415)  lactated ringers infusion ( Intravenous New Bag/Given 02/03/18 1552)  promethazine (PHENERGAN) injection 25 mg (25 mg Intramuscular Given 02/03/18 1316)  sodium chloride 0.9 % bolus 1,000 mL (0 mLs Intravenous Stopped 02/03/18 1419)  potassium chloride 10 mEq in 100 mL IVPB (10 mEq Intravenous New Bag/Given 02/03/18 1556)  lactated ringers bolus 1,000 mL (0 mLs Intravenous Stopped 02/03/18 1528)    Mobility manual wheelchair

## 2018-02-03 NOTE — ED Notes (Signed)
Bed: [id number] Expected date:  Expected time:  Means of arrival:  Comments: EMS 67 yo male N/V 160/90 CBG 340 after receiving fentanyl shot

## 2018-02-03 NOTE — Progress Notes (Signed)
Received patient from ED, VS obtained, placed on monitor, oriented to unit, call bell placed in reach

## 2018-02-03 NOTE — ED Notes (Signed)
ED Provider at bedside. [place] 

## 2018-02-03 NOTE — H&P (Signed)
History and Physical    Samuel BusingJohn D Pratt ZOX:[id number] DOB: 28-Jun-1950 DOA: 02/03/2018  PCP: Pearson GrippeKim, James, MD   Patient coming from: Home  Chief Complaint: Nausea and Vomiting   HPI: Samuel [name] is a 67 y.o. male with medical history significant of anxiety, depression, history of GERD and gastroparesis, history of left BKA status post Charcot foot and diabetes, history of arthritis, history of GERD, peripheral neuropathy, CAD and MI, OSA, type 2 diabetes mellitus and other comorbidities who presents with a chief complaint of nausea vomiting  Nausea and Vomiting started around 3 this AM. A little SOB but no CP. Woke with N/V. Missed several Insulin doses this week. No Abodminal Pain. Had some buring and discomfort in Urine. Eating ok earlier but now vomiting. Has blurred vision but attributes it to cataracts. No Lightheadedness or dizziness. No blood in vomit but states its bilious. No Diarrhea but has loose stools.  Patient was seen earlier this a.m. in the ED for similar symptoms went home and felt worse so he presented for further evaluation [facility] was called to admit this patient for DKA.  ED Course: Patient was given 2 L of LR and started on insulin drip,.  He was also started on normal saline and given IV promethazine.  Basic blood work was done.   Review of Systems: As per HPI otherwise 10 point review of systems negative.   Past Medical History:  Diagnosis Date  . Anxiety   . Arthritis    "all over"   . CAD (coronary artery disease)   . Charcot's joint    "left foot"  . 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 Depression   . Gastroparesis   . GERD (gastroesophageal reflux disease)   . H/O hiatal hernia   . Hyperlipidemia   . Hypertension   . Myocardial infarction (HCC) 2017/03/27   around this date  . OSA (obstructive sleep apnea)    "not bad enough for a mask"  . Peripheral neuropathy   . Peripheral vascular disease (HCC)   . PONV (postoperative nausea and  vomiting)   . Pulmonary embolism (HCC)    hx. of 2012  . Shortness of breath    exertion  . Type II diabetes mellitus (HCC)    Past Surgical History:  Procedure Laterality Date  . AMPUTATION Left 03/08/2015   Procedure:  LEFT AMPUTATION BELOW KNEE;  Surgeon: Toni ArthursJohn Hewitt, MD;  Location: [facility];  Service: Orthopedics;  Laterality: Left;  . APPLICATION OF WOUND VAC Left 01/07/2014  . CHOLECYSTECTOMY  06/2010  . CORONARY ANGIOPLASTY WITH STENT PLACEMENT  05/2009   "1"  . FOOT SURGERY Left 2010   "for Charcot's joint"  . HARDWARE REMOVAL Left 01/07/2014   Procedure: LEFT LEG REMOVAL OF DEEP IMPLANT AND SEQUESTRECTOMY; APPLICATION OF WOUND VAC ;  Surgeon: Toni ArthursJohn Hewitt, MD;  Location: [facility];  Service: Orthopedics;  Laterality: Left;  . I&D EXTREMITY Left "multiple"   leg  . I&D EXTREMITY Left 01/07/2014   Procedure: 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;  Surgeon: Toni ArthursJohn Hewitt, MD;  Location: [facility];  Service: Orthopedics;  Laterality: Left;  . IM NAILING TIBIA Left ~ 2012  . LEFT HEART CATH AND CORONARY ANGIOGRAPHY N/A 04/08/2017   Procedure: LEFT HEART CATH AND CORONARY ANGIOGRAPHY;  Surgeon: Runell GessBerry, Jonathan J, MD;  Location: [facility];  Service: Cardiovascular;  Laterality: N/A;  . SHOULDER ARTHROSCOPY W/ ROTATOR CUFF REPAIR Left    "and bone  spurs"  . TIBIAL IM ROD REMOVAL Left 01/07/2014  . TOE AMPUTATION Right ~ 2011   "great toe"  . VENA CAVA FILTER PLACEMENT  2012  . VENA CAVA FILTER PLACEMENT N/A 11/20/2016   Procedure: INSERTION VENA-CAVA FILTER;  Surgeon: Sherren KernsFields, Charles E, MD;  [facility];  Service: Vascular;  Laterality: N/A;  . WOUND DEBRIDEMENT Left 01/07/2014   "tibia"   SOCIAL HISTORY  reports that he has never smoked. He has never used smokeless tobacco. He reports that he does not drink alcohol or use drugs.  Allergies  Allergen Reactions  . Gabapentin Hives, Itching and Rash    REACTION: rash/hives (per patient, was a reaction to an inactive  ingredient in another mgf brand). The patient stated that he does take this medication now and it doesn't cause a rash any more.  . Nabumetone Itching, Nausea Only and Rash  . Reglan [Metoclopramide] Hives and Nausea Only  . Codeine Other (See Comments)    GI UPSET - hives   Family History  Problem Relation Age of Onset  . COPD Mother   . Heart disease Mother   . Lung cancer Mother   . Retinal detachment Father   . Alcoholism Brother   . Heart disease Brother   . COPD Brother   . Diabetes Brother   . Hypertension Brother   . Stroke Brother    Prior to Admission medications   Medication Sig Start Date End Date Taking? Authorizing Provider  amLODipine (NORVASC) 10 MG tablet Take 1 tablet (10 mg total) by mouth daily. 11/22/16  Yes Richarda OverlieAbrol, Nayana, MD  apixaban (ELIQUIS) 5 MG TABS tablet Take 1 tablet (5 mg total) by mouth 2 (two) times daily. 09/24/17  Yes Osvaldo ShipperKrishnan, Gokul, MD  aspirin 81 MG EC tablet Take 1 tablet (81 mg total) by mouth daily. 04/12/17  Yes Glade LloydAlekh, Kshitiz, MD  atorvastatin (LIPITOR) 80 MG tablet Take 1 tablet (80 mg total) by mouth daily at 6 PM. 04/11/17  Yes Hanley BenAlekh, Kshitiz, MD  clopidogrel (PLAVIX) 75 MG tablet Take 1 tablet (75 mg total) by mouth daily. 04/12/17  Yes Glade LloydAlekh, Kshitiz, MD  ferrous sulfate 325 (65 FE) MG tablet Take 325 mg by mouth every evening.    Yes [provider]  furosemide (LASIX) 20 MG tablet Take 1 tablet (20 mg total) by mouth daily. Patient taking differently: Take 40 mg by mouth daily.  04/12/17  Yes Glade LloydAlekh, Kshitiz, MD  gabapentin (NEURONTIN) 300 MG capsule Take 300 mg by mouth 4 (four) times daily.    Yes [provider]  insulin detemir (LEVEMIR) 100 UNIT/ML injection Inject 0.25 mLs (25 Units total) into the skin 2 (two) times daily. Patient taking differently: Inject 80 Units into the skin 2 (two) times daily.  09/24/17  Yes Osvaldo ShipperKrishnan, Gokul, MD  lansoprazole (PREVACID) 30 MG capsule Take 30 mg by mouth 2 (two) times daily.  03/19/17  Yes [provider]  metoprolol tartrate (LOPRESSOR) 25 MG tablet Take 1 tablet (25 mg total) by mouth 2 (two) times daily. 04/11/17  Yes Glade LloydAlekh, Kshitiz, MD  ranitidine (ZANTAC) 150 MG tablet Take 150 mg by mouth 2 (two) times daily.    Yes [provider]  ranolazine (RANEXA) 500 MG 12 hr tablet Take 1 tablet (500 mg total) by mouth 2 (two) times daily. 04/11/17  Yes Glade LloydAlekh, Kshitiz, MD  ibuprofen (ADVIL,MOTRIN) 200 MG tablet Take 400 mg by mouth every 6 (six) hours as needed for moderate pain.    [provider]  insulin lispro (HUMALOG) 100 UNIT/ML injection Inject 0-10 Units into the skin 3 (three) times daily with meals. Inject SQ per sliding scale 150-250 (5 units), 251-350(8 units), 351-450(10 units), >450=10 units    [provider]  metoCLOPramide (REGLAN) 5 MG tablet Take 1 tablet (5 mg total) by mouth 3 (three) times daily. 01/02/18 02/01/18  Noralee Stain, DO  nitroGLYCERIN (NITROSTAT) 0.4 MG SL tablet Place 1 tablet (0.4 mg total) under the tongue every 5 (five) minutes as needed for chest pain. 04/11/17   Glade Lloyd, MD  [facility] VERIO test strip Use to test blood sugar twice daily 09/24/17   [provider]  promethazine (PHENERGAN) 25 MG tablet Take 25-50 mg by mouth every 4 (four) hours as needed for nausea or vomiting.     [provider]  traMADol (ULTRAM) 50 MG tablet Take 100 mg by mouth every 6 (six) hours as needed for moderate pain.     [provider]   Physical Exam: Vitals:   02/03/18 1730 02/03/18 1731 02/03/18 1749 02/03/18 1815  BP: (!) 120/55  (!) 141/64   Pulse: 97 95    Resp: (!) 23 20 17    Temp:   97.9 F (36.6 C)   TempSrc:   Oral   SpO2: 96% 97% 96%   Weight:    122.1 kg  Height:    6\' 3"  (1.905 m)   Constitutional: WN/WD obese Caucasian male in NAD and appears calm but uncomfortable from being nauseous  Eyes: Lids and conjunctivae normal, sclerae anicteric  ENMT: External Ears, Nose  appear normal. Grossly normal hearing. Mucous membranes are dry.  Neck: Appears normal, supple, no cervical masses, normal ROM, no appreciable thyromegaly, no JVD Respiratory: Diminished to auscultation bilaterally, no wheezing, rales, rhonchi or crackles. Normal respiratory effort and patient is not tachypenic. No accessory muscle use.  Cardiovascular: RRR, no murmurs / rubs / gallops. S1 and S2 auscultated. Trace Right Leg Edema.  Abdomen: Soft, non-tender, Distended 2/2 to body habitus. No masses palpated. No appreciable hepatosplenomegaly. Bowel sounds positive x4.  GU: Deferred. Musculoskeletal: Left BKA Skin: No rashes, lesions, ulcers on a limited skin evaluation. No induration; Warm and dry.  Neurologic: CN 2-12 grossly intact with no focal deficits. Romberg sign and cerebellar reflexes not assessed.  Psychiatric: Normal judgment and insight. Alert and oriented x 3. Normal mood and appropriate affect.   Labs on Admission: I have personally reviewed following labs and imaging studies  CBC: Recent Labs  Lab 02/03/18 0416 02/03/18 1244  WBC 9.4 7.9  NEUTROABS 8.0* 6.8  HGB 15.1 15.0  HCT 43.2 44.2  MCV 90.0 89.8  PLT 237 262   Basic Metabolic Panel: Recent Labs  Lab 02/03/18 0416 02/03/18 1244  NA 137 139  K 4.2 4.8  CL 98 99  CO2 22 20*  GLUCOSE 384* 371*  BUN 17 18  CREATININE 1.03 1.20  CALCIUM 9.2 9.0   GFR: Estimated Creatinine Clearance: 87.5 mL/min (by C-G formula based on SCr of 1.2 mg/dL). Liver Function Tests: Recent Labs  Lab 02/03/18 1244  AST 21  ALT 20  ALKPHOS 95  BILITOT 1.7*  PROT 7.6  ALBUMIN 3.6   Recent Labs  Lab 02/03/18 0416 02/03/18 1244  LIPASE 20 19   No results for input(s): AMMONIA in the last 168 hours. Coagulation Profile: No results for input(s): INR, PROTIME in the last 168 hours. Cardiac Enzymes: No results for input(s): CKTOTAL, CKMB, CKMBINDEX, TROPONINI in the last 168  hours. BNP (last 3 results) No results for  input(s): PROBNP in the last 8760 hours. HbA1C: No results for input(s): HGBA1C in the last 72 hours. CBG: Recent Labs  Lab 02/03/18 1126 02/03/18 1438 02/03/18 1552 02/03/18 1712 02/03/18 1852  GLUCAP 319* 321* 273* 254* 222*   Lipid Profile: No results for input(s): CHOL, HDL, LDLCALC, TRIG, CHOLHDL, LDLDIRECT in the last 72 hours. Thyroid Function Tests: No results for input(s): TSH, T4TOTAL, FREET4, T3FREE, THYROIDAB in the last 72 hours. Anemia Panel: No results for input(s): VITAMINB12, FOLATE, FERRITIN, TIBC, IRON, RETICCTPCT in the last 72 hours. Urine analysis:    Component Value Date/Time   COLORURINE YELLOW 02/03/2018 1125   APPEARANCEUR CLEAR 02/03/2018 1125   LABSPEC 1.027 02/03/2018 1125   PHURINE 5.0 02/03/2018 1125   GLUCOSEU >=500 (A) 02/03/2018 1125   HGBUR SMALL (A) 02/03/2018 1125   BILIRUBINUR NEGATIVE 02/03/2018 1125   KETONESUR 80 (A) 02/03/2018 1125   PROTEINUR 30 (A) 02/03/2018 1125   UROBILINOGEN 1.0 04/05/2015 1155   NITRITE NEGATIVE 02/03/2018 1125   LEUKOCYTESUR NEGATIVE 02/03/2018 1125   Sepsis Labs: !!!!!!!!!!!!!!!!!!!!!!!!!!!!!!!!!!!!!!!!!!!! @LABRCNTIP (procalcitonin:4,lacticidven:4) )No results found for this or any previous visit (from the past 240 hour(s)).   Radiological Exams on Admission: No results found.  EKG: Independently reviewed. Showed a sinus rhythm at a rate of 88. EKG was poor quality and showed some mild ST Changes.   Assessment/Plan Active Problems:   GERD   Pulmonary embolism (HCC)   CAD S/P DES PCI to mLAD: Xience DES 2.75 x 15 (3.0 mm)   Essential hypertension   Hyperlipidemia due to type 2 diabetes mellitus (HCC)   Gastroparesis   Intractable nausea and vomiting   Diabetes mellitus type 2, uncontrolled (HCC)   S/P BKA (below knee amputation) (HCC)   COPD (chronic obstructive pulmonary disease) (HCC)   Uncontrolled diabetes mellitus (HCC)   DKA, type 2 (HCC)  Early DKA (Type 2 Diabetes) 2/2 in the setting  of Several Missed Insulin Doses  -Place in SDU Obs -Initiated DKA Protocol and Insulin gtt -Given IVF Boluses with 2 Liters of IV LR; C/w IVF with NS at a rate of 100 mL/hr and then transition to D5 1/2 NS when Blood Sugar is <250 to a rate of 75 mL/hr -Will not transition to Long Acting until CO2 is >20 x2 and Gap is Closed  -NPO except Ice Chips -HbA1c last time was 12.4 -Continue monitor blood sugars carefully and transition when appropriate -Follow BMP's -Hold Home Insulin regimen at this time    N/V/ Dehydration 2/2 to DKA -C/w Supportive Care with IVF Hydration -C/w Antiemetics with po/IV Zofran    Anion Gap Metabolic Acidosis -Anion gap on admission was 20 and a CO2 is  20 -Continue with IV fluid hydration and continue to monitor BMPs every 4 hours -Repeat CMP in AM   Diabetes Mellitus Type 2 complicated with Gastroparesis and Peripheral Neuropathy  -Last HbA1c was 12.4 -Insulin gtt as above and transition to Long Acting Insulin when appropriate -C/w Gabapentin 300 mg po QID and with Metaclopramide but schedule it IV 10 mg q8h and then when taking po transition back to Home 5 mg po TID   CAD s/p Stenting and Hx of MI -C/w ASA 81 mg po Daily; C/w Atorvastatin 80 mg po Daily, Clopidogrel 75 mg po Daily -May need to just Use ASA or Clopidogrel given that he is Anticoagulated with Apixaban for Hx of PE -C/w NTG 0.4 mg SL q16minprn for CP -C/w Metoprolol  25 mg po BID -C/w Ranolazine 500 mg po BID  HTN -C/w Amlodipine 10 mg po Daily -C/w Metoprolol Tartrate 25 mg po BID  OSA -Does not wear CPAP at Home -Order CPAP for Hospital   Hx of PE -C/w Apixaban 5 mg po BID  GERD -C/w Ranitidine 150 mg po BID substitution with Famotidine 20 mg po BID -C/w Lansoprazole 30 mg po BID substitution with po Pantoprazole 40 mg BOD  HLD -C/w Atorvasttin 80 mg po qHS  Obesity -Estimated body mass index is 33.65 kg/m as calculated from the following:   Height as of this encounter: 6'  3" (1.905 m).   Weight as of this encounter: 122.1 kg.  -Weight Loss Counseling given   DVT prophylaxis: Anticoagulated with Apixaban 5 mg po BID Code Status: DO NOT RESUSCITATE Family Communication: Discussed with family present at bedside  Disposition Plan: Anticipate D/C Home in the next 24-48 hours Consults called: None Admission status: SDU Obs  Severity of Illness: The appropriate patient status for this patient is OBSERVATION. Observation status is judged to be reasonable and necessary in order to provide the required intensity of service to ensure the patient's safety. The patient's presenting symptoms, physical exam findings, and initial radiographic and laboratory data in the context of their medical condition is felt to place them at decreased risk for further clinical deterioration. Furthermore, it is anticipated that the patient will be medically stable for discharge from the hospital within 2 midnights of admission. The following factors support the patient status of observation.   " The patient's presenting symptoms include Nausea and Vomiting. " The physical exam findings include Dry mucous membranes. " The initial radiographic and laboratory data are suspicious for Early DKA given his missed insulin doses.  Merlene Laughter, D.O. [facility] Pager [phone]  If 7PM-7AM, please contact night-coverage [email or web address] Password [email or web address]  02/03/2018, 7:08 PM

## 2018-02-03 NOTE — ED Provider Notes (Signed)
[facility]-EMERGENCY DEPT Provider Note   CSN: [id number] Arrival date & time: 02/03/18  0411     History   Chief Complaint Chief Complaint  Patient presents with  . Emesis    HPI Samuel [name] is a 67 y.o. male.  The history is provided by the patient and a relative.  Emesis   This is a new problem. The current episode started 6 to 12 hours ago. The problem has been rapidly worsening. The emesis has an appearance of stomach contents. There has been no fever. Associated symptoms include abdominal pain. Pertinent negatives include no diarrhea and no fever.  Patient with history of obesity, diabetes, gastroparesis presents with vomiting.  He has a long history of gastroparesis with multiple evaluations.  This episode started around midnight after receiving an IM injection of promethazine. Since that time he reports he is vomiting "bile" Denies chest pain, but reports chronic shortness of breath.  He reports abdominal discomfort.  No change in his bowel movements.  Past Medical History:  Diagnosis Date  . Anxiety   . Arthritis    "all over"   . CAD (coronary artery disease)   . Charcot's joint    "left foot"  . 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 Depression   . Gastroparesis   . GERD (gastroesophageal reflux disease)   . H/O hiatal hernia   . Hyperlipidemia   . Hypertension   . Myocardial infarction (HCC) 2017/03/27   around this date  . OSA (obstructive sleep apnea)    "not bad enough for a mask"  . Peripheral neuropathy   . Peripheral vascular disease (HCC)   . PONV (postoperative nausea and vomiting)   . Pulmonary embolism (HCC)    hx. of 2012  . Shortness of breath    exertion  . Type II diabetes mellitus [facility](HCC)     Patient Active Problem List   Diagnosis Date Noted  . DOE (dyspnea on exertion) 01/24/2018  . Acute lower UTI 12/31/2017  . Uncontrolled diabetes mellitus (HCC) 09/23/2017  . DKA, type 2, not at goal  [facility](HCC) 09/22/2017  . S/P BKA (below knee amputation) (HCC) 04/19/2017  . COPD (chronic obstructive pulmonary disease) (HCC) 04/19/2017  . NSTEMI (non-ST elevated myocardial infarction) (HCC) 04/07/2017  . Osteomyelitis of foot (HCC)   . Protein-calorie malnutrition, severe (HCC) 03/08/2015  . Osteomyelitis of foot, acute (HCC) 03/07/2015  . Sepsis (HCC) 03/07/2015  . Diabetes mellitus type 2, uncontrolled (HCC) 03/07/2015  . Normocytic normochromic anemia 03/07/2015  . Obesity (BMI 30-39.9) 03/07/2015  . Nausea & vomiting   . DM (diabetes mellitus), type 2 (HCC) 01/07/2014  . Chronic osteomyelitis involving lower leg (HCC) 01/07/2014  . Vomiting 12/22/2013  . Gastroparesis 12/21/2013  . Intractable nausea and vomiting 12/21/2013  . Essential hypertension 05/01/2013  . Hyperlipidemia due to type 2 diabetes mellitus (HCC) 05/01/2013  . Generalized weakness 03/20/2013  . Back pain 03/20/2013  . Pulmonary embolism (HCC) 03/27/2011  . PERFORATION OF GALLBLADDER 08/30/2010  . Ulcer of lower limb, unspecified 03/01/2010  . METHICILLIN SUSCEPTIBLE STAPH AUREUS SEPTICEMIA 01/25/2010  . Acute osteomyelitis, ankle and foot 01/25/2010  . NAUSEA AND VOMITING 01/25/2010  . GERD 11/09/2009  . CAD S/P DES PCI to mLAD: Xience DES 2.75 x 15 (3.0 mm) 05/01/2009  . DIABETES, TYPE 1 05/14/2008  . OBSTRUCTIVE SLEEP APNEA 04/30/2008  . ALLERGIC RHINITIS, SEASONAL 04/30/2008    Past Surgical History:  Procedure Laterality Date  . AMPUTATION  Left 03/08/2015   Procedure:  LEFT AMPUTATION BELOW KNEE;  Surgeon: Toni Arthurs, MD;  Location: [facility] ORS;  Service: Orthopedics;  Laterality: Left;  . APPLICATION OF WOUND VAC Left 01/07/2014  . CHOLECYSTECTOMY  06/2010  . CORONARY ANGIOPLASTY WITH STENT PLACEMENT  05/2009   "1"  . FOOT SURGERY Left 2010   "for Charcot's joint"  . HARDWARE REMOVAL Left 01/07/2014   Procedure: LEFT LEG REMOVAL OF DEEP IMPLANT AND SEQUESTRECTOMY; APPLICATION OF WOUND VAC ;  Surgeon:  Toni Arthurs, MD;  Location: [facility] OR;  Service: Orthopedics;  Laterality: Left;  . I&D EXTREMITY Left "multiple"   leg  . 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 IM NAILING TIBIA Left ~ 2012  . LEFT HEART CATH AND CORONARY ANGIOGRAPHY N/A 04/08/2017   Procedure: LEFT HEART CATH AND CORONARY ANGIOGRAPHY;  Surgeon: Runell Gess, MD;  Location: [facility] INVASIVE CV LAB;  Service: Cardiovascular;  Laterality: N/A;  . SHOULDER ARTHROSCOPY W/ ROTATOR CUFF REPAIR Left    "and bone spurs"  . TIBIAL IM ROD REMOVAL Left 01/07/2014  . TOE AMPUTATION Right ~ 2011   "great toe"  . VENA CAVA FILTER PLACEMENT  2012  . VENA CAVA FILTER PLACEMENT N/A 11/20/2016   Procedure: INSERTION VENA-CAVA FILTER;  Surgeon: Sherren Kerns, MD;  Location: [facility] OR;  Service: Vascular;  Laterality: N/A;  . WOUND DEBRIDEMENT Left 01/07/2014   "tibia"        Home Medications    Prior to Admission medications   Medication Sig Start Date End Date Taking? Authorizing Provider  amLODipine (NORVASC) 10 MG tablet Take 1 tablet (10 mg total) by mouth daily. 11/22/16  Yes Richarda Overlie, MD  apixaban (ELIQUIS) 5 MG TABS tablet Take 1 tablet (5 mg total) by mouth 2 (two) times daily. 09/24/17  Yes Osvaldo Shipper, MD  aspirin 81 MG EC tablet Take 1 tablet (81 mg total) by mouth daily. 04/12/17  Yes Glade Lloyd, MD  atorvastatin (LIPITOR) 80 MG tablet Take 1 tablet (80 mg total) by mouth daily at 6 PM. 04/11/17  Yes Hanley Ben, Kshitiz, MD  clopidogrel (PLAVIX) 75 MG tablet Take 1 tablet (75 mg total) by mouth daily. 04/12/17  Yes Glade Lloyd, MD  ferrous sulfate 325 (65 FE) MG tablet Take 325 mg by mouth every evening.    Yes [provider]  furosemide (LASIX) 20 MG tablet Take 1 tablet (20 mg total) by mouth daily. Patient taking differently: Take 40 mg by mouth daily.  04/12/17  Yes  Glade Lloyd, MD  gabapentin (NEURONTIN) 300 MG capsule Take 300 mg by mouth 4 (four) times daily.    Yes [provider]  ibuprofen (ADVIL,MOTRIN) 200 MG tablet Take 400 mg by mouth every 6 (six) hours as needed for moderate pain.   Yes [provider]  insulin detemir (LEVEMIR) 100 UNIT/ML injection Inject 0.25 mLs (25 Units total) into the skin 2 (two) times daily. Patient taking differently: Inject 80 Units into the skin 2 (two) times daily.  09/24/17  Yes Osvaldo Shipper, MD  insulin lispro (HUMALOG) 100 UNIT/ML injection Inject 0-10 Units into the skin 3 (three) times daily with meals. Inject SQ per sliding scale 150-250 (5 units), 251-350(8 units), 351-450(10 units), >450=10 units   Yes [provider]  lansoprazole (PREVACID) 30 MG capsule Take 30 mg by mouth 2 (two) times daily.  03/19/17  Yes [provider]  metoprolol tartrate (LOPRESSOR) 25 MG tablet Take 1 tablet (25 mg total) by mouth 2 (two) times daily. 04/11/17  Yes Glade LloydAlekh, Kshitiz, MD  promethazine (PHENERGAN) 25 MG tablet Take 25-50 mg by mouth every 4 (four) hours as needed for nausea or vomiting.    Yes [provider]  ranitidine (ZANTAC) 150 MG tablet Take 150 mg by mouth 2 (two) times daily.    Yes [provider]  ranolazine (RANEXA) 500 MG 12 hr tablet Take 1 tablet (500 mg total) by mouth 2 (two) times daily. 04/11/17  Yes Glade LloydAlekh, Kshitiz, MD  traMADol (ULTRAM) 50 MG tablet Take 100 mg by mouth every 6 (six) hours as needed for moderate pain.    Yes [provider]  metoCLOPramide (REGLAN) 5 MG tablet Take 1 tablet (5 mg total) by mouth 3 (three) times daily. 01/02/18 02/01/18  Noralee Stainhoi, Jennifer, DO  nitroGLYCERIN (NITROSTAT) 0.4 MG SL tablet Place 1 tablet (0.4 mg total) under the tongue every 5 (five) minutes as needed for chest pain. 04/11/17   Glade LloydAlekh, Kshitiz, MD  [facility] VER[facility] test strip Use to test blood sugar twice daily 09/24/17   [provider]     Family History Family History  Problem Relation Age of Onset  . COPD Mother   . Heart disease Mother   . Lung cancer Mother   . Retinal detachment Father   . Alcoholism Brother   . Heart disease Brother   . COPD Brother   . Diabetes Brother   . Hypertension Brother   . Stroke Brother     Social History Social History   Tobacco Use  . Smoking status: Never Smoker  . Smokeless tobacco: Never Used  Substance Use Topics  . Alcohol use: No    Frequency: Never    Comment: 01/07/2014 'might have a wine cooler a couple times/yr"  . Drug use: No     Allergies   Gabapentin; Nabumetone; Reglan [metoclopramide]; and Codeine   Review of Systems Review of Systems  Constitutional: Negative for fever.  Respiratory: Positive for shortness of breath.        Chronic shortness of breath  Cardiovascular: Negative for chest pain.  Gastrointestinal: Positive for abdominal pain and vomiting. Negative for diarrhea.  All other systems reviewed and are negative.    Physical Exam Updated Vital Signs BP (!) 150/86   Pulse 84   Resp (!) 9   SpO2 96%   Physical Exam CONSTITUTIONAL: Chronically ill-appearing HEAD: Normocephalic/atraumatic EYES: EOMI/PERRL ENMT: Mucous membranes dry NECK: supple no meningeal signs SPINE/BACK:entire spine nontender CV: S1/S2 noted, no murmurs/rubs/gallops noted LUNGS: Lungs are clear to auscultation bilaterally, no apparent distress ABDOMEN: soft, mild epigastric tenderness, no rebound or guarding, bowel sounds noted throughout abdomen GU:no cva tenderness NEURO: Pt is awake/alert/appropriate, moves all extremitiesx4.   EXTREMITIES: pulses normal/equal, full ROM, left BKA noted SKIN: warm, color normal PSYCH: no abnormalities of mood noted, alert and oriented to situation   ED Treatments / Results  Labs (all labs ordered are listed, but only abnormal results are displayed) Labs Reviewed  CBC WITH DIFFERENTIAL/PLATELET - Abnormal; Notable  for the following components:      Result Value   Neutro Abs 8.0 (*)    All other components within normal limits  BASIC METABOLIC PANEL - Abnormal; Notable for the following components:   Glucose, Bld 384 (*)    Anion gap 17 (*)    All other components within normal limits  LIPASE, BLOOD    EKG EKG Interpretation  Date/Time:  Monday February 03 2018 04:32:51 EDT Ventricular Rate:  76 PR Interval:    QRS Duration: 98 QT Interval:  419 QTC Calculation: 472 R Axis:   2 Text Interpretation:  Sinus rhythm Borderline ST elevation, inferior leads no change from prior Confirmed by Zadie Rhine ([id number]) on 02/03/2018 4:37:46 AM   Radiology No results found.  Procedures Procedures (including critical care time)  Medications Ordered in ED Medications  sodium chloride 0.9 % bolus 1,000 mL (0 mLs Intravenous Stopped 02/03/18 0600)  diphenhydrAMINE (BENADRYL) injection 25 mg (25 mg Intravenous Given 02/03/18 0524)  metoCLOPramide (REGLAN) injection 10 mg (10 mg Intravenous Given 02/03/18 0524)     Initial Impression / Assessment and Plan / ED Course  I have reviewed the triage vital signs and the nursing notes.  Pertinent labs results that were available during my care of the patient were reviewed by me and considered in my medical decision making (see chart for details).     PT presents for recurrent nausea vomiting likely gastroparesis. He has a long history of this.  He apparently received an injection from nursing home within this began.  Here in emergency department he received IV fluids as well as Benadryl and Reglan.  He is now near baseline taking p.o. fluids.  He feels improved.  He is not septic appearing. Labs reveal hyperglycemia, but bicarb is normal, low suspicion for DKA. Patient is requesting discharge home. Caregiver at bedside Final Clinical Impressions(s) / ED Diagnoses   Final diagnoses:  Non-intractable cyclical vomiting with nausea  Gastroparesis   Dehydration  Hyperglycemia without ketosis    ED Discharge Orders    None       Zadie Rhine, MD 02/03/18 0700

## 2018-02-03 NOTE — ED Notes (Signed)
[name]- cousin's wife (caregiver) - [phone]

## 2018-02-03 NOTE — ED Provider Notes (Signed)
[facility]-EMERGENCY DEPT Provider Note   CSN: [id number] Arrival date & time: 02/03/18  1109     History   Chief Complaint Chief Complaint  Patient presents with  . Nausea    HPI Samuel [name] is a 67 y.o. male.  HPI  67 year old male with a history of gastroparesis and diabetes presents with recurrent nausea and feeling like he has gastroparesis.  He was seen earlier this morning with the same.  Was given a dose of IM Phenergan around midnight and was given IV Reglan here.  Felt better while he was here but then a couple hours after going home has felt recurrently worse.  He has chronic dyspnea but no chest pain or new shortness of breath.  No abdominal pain except a little bit of soreness from vomiting earlier.  Feels very similar to multiple prior gastroparesis episodes.  Past Medical History:  Diagnosis Date  . Anxiety   . Arthritis    "all over"   . CAD (coronary artery disease)   . Charcot's joint    "left foot"  . 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 Depression   . Gastroparesis   . GERD (gastroesophageal reflux disease)   . H/O hiatal hernia   . Hyperlipidemia   . Hypertension   . Myocardial infarction (HCC) 2017/03/27   around this date  . OSA (obstructive sleep apnea)    "not bad enough for a mask"  . Peripheral neuropathy   . Peripheral vascular disease (HCC)   . PONV (postoperative nausea and vomiting)   . Pulmonary embolism (HCC)    hx. of 2012  . Shortness of breath    exertion  . Type II diabetes mellitus [facility])     Patient Active Problem List   Diagnosis Date Noted  . DOE (dyspnea on exertion) 01/24/2018  . Acute lower UTI 12/31/2017  . Uncontrolled diabetes mellitus (HCC) 09/23/2017  . DKA, type 2, not at goal [facility]) 09/22/2017  . S/P BKA (below knee amputation) (HCC) 04/19/2017  . COPD (chronic obstructive pulmonary disease) (HCC) 04/19/2017  . NSTEMI (non-ST elevated myocardial infarction) (HCC)  04/07/2017  . Osteomyelitis of foot (HCC)   . Protein-calorie malnutrition, severe (HCC) 03/08/2015  . Osteomyelitis of foot, acute (HCC) 03/07/2015  . Sepsis (HCC) 03/07/2015  . Diabetes mellitus type 2, uncontrolled (HCC) 03/07/2015  . Normocytic normochromic anemia 03/07/2015  . Obesity (BMI 30-39.9) 03/07/2015  . Nausea & vomiting   . DM (diabetes mellitus), type 2 (HCC) 01/07/2014  . Chronic osteomyelitis involving lower leg (HCC) 01/07/2014  . Vomiting 12/22/2013  . Gastroparesis 12/21/2013  . Intractable nausea and vomiting 12/21/2013  . Essential hypertension 05/01/2013  . Hyperlipidemia due to type 2 diabetes mellitus (HCC) 05/01/2013  . Generalized weakness 03/20/2013  . Back pain 03/20/2013  . Pulmonary embolism (HCC) 03/27/2011  . PERFORATION OF GALLBLADDER 08/30/2010  . Ulcer of lower limb, unspecified 03/01/2010  . METHICILLIN SUSCEPTIBLE STAPH AUREUS SEPTICEMIA 01/25/2010  . Acute osteomyelitis, ankle and foot 01/25/2010  . NAUSEA AND VOMITING 01/25/2010  . GERD 11/09/2009  . CAD S/P DES PCI to mLAD: Xience DES 2.75 x 15 (3.0 mm) 05/01/2009  . DIABETES, TYPE 1 05/14/2008  . OBSTRUCTIVE SLEEP APNEA 04/30/2008  . ALLERGIC RHINITIS, SEASONAL 04/30/2008    Past Surgical History:  Procedure Laterality Date  . AMPUTATION Left 03/08/2015   Procedure:  LEFT AMPUTATION BELOW KNEE;  Surgeon: Toni Arthurs, MD;  Location: [facility] ORS;  Service: Orthopedics;  Laterality: Left;  . APPLICATION OF WOUND VAC Left 01/07/2014  . CHOLECYSTECTOMY  06/2010  . CORONARY ANGIOPLASTY WITH STENT PLACEMENT  05/2009   "1"  . FOOT SURGERY Left 2010   "for Charcot's joint"  . HARDWARE REMOVAL Left 01/07/2014   Procedure: LEFT LEG REMOVAL OF DEEP IMPLANT AND SEQUESTRECTOMY; APPLICATION OF WOUND VAC ;  Surgeon: Toni Arthurs, MD;  Location: [facility] OR;  Service: Orthopedics;  Laterality: Left;  . I&D EXTREMITY Left "multiple"   leg  . I&D EXTREMITY Left 01/07/2014   Procedure: IRRIGATION AND DEBRIDEMENT OF  CHRONIC TIBIAL ULCER;  Surgeon: Toni Arthurs, MD;  Location: [facility] OR;  Service: Orthopedics;  Laterality: Left;  . IM NAILING TIBIA Left ~ 2012  . LEFT HEART CATH AND CORONARY ANGIOGRAPHY N/A 04/08/2017   Procedure: LEFT HEART CATH AND CORONARY ANGIOGRAPHY;  Surgeon: Runell Gess, MD;  Location: [facility] INVASIVE CV LAB;  Service: Cardiovascular;  Laterality: N/A;  . SHOULDER ARTHROSCOPY W/ ROTATOR CUFF REPAIR Left    "and bone spurs"  . TIBIAL IM ROD REMOVAL Left 01/07/2014  . TOE AMPUTATION Right ~ 2011   "great toe"  . VENA CAVA FILTER PLACEMENT  2012  . VENA CAVA FILTER PLACEMENT N/A 11/20/2016   Procedure: INSERTION VENA-CAVA FILTER;  Surgeon: Sherren Kerns, MD;  Location: [facility] OR;  Service: Vascular;  Laterality: N/A;  . WOUND DEBRIDEMENT Left 01/07/2014   "tibia"        Home Medications    Prior to Admission medications   Medication Sig Start Date End Date Taking? Authorizing Provider  amLODipine (NORVASC) 10 MG tablet Take 1 tablet (10 mg total) by mouth daily. 11/22/16  Yes Richarda Overlie, MD  apixaban (ELIQUIS) 5 MG TABS tablet Take 1 tablet (5 mg total) by mouth 2 (two) times daily. 09/24/17  Yes Osvaldo Shipper, MD  aspirin 81 MG EC tablet Take 1 tablet (81 mg total) by mouth daily. 04/12/17  Yes Glade Lloyd, MD  atorvastatin (LIPITOR) 80 MG tablet Take 1 tablet (80 mg total) by mouth daily at 6 PM. 04/11/17  Yes Hanley Ben, Kshitiz, MD  clopidogrel (PLAVIX) 75 MG tablet Take 1 tablet (75 mg total) by mouth daily. 04/12/17  Yes Glade Lloyd, MD  ferrous sulfate 325 (65 FE) MG tablet Take 325 mg by mouth every evening.    Yes [provider]  furosemide (LASIX) 20 MG tablet Take 1 tablet (20 mg total) by mouth daily. Patient taking differently: Take 40 mg by mouth daily.  04/12/17  Yes Glade Lloyd, MD  gabapentin (NEURONTIN) 300 MG capsule Take 300 mg by mouth 4 (four) times daily.    Yes [provider]  insulin detemir (LEVEMIR) 100 UNIT/ML injection Inject 0.25  mLs (25 Units total) into the skin 2 (two) times daily. Patient taking differently: Inject 80 Units into the skin 2 (two) times daily.  09/24/17  Yes Osvaldo Shipper, MD  lansoprazole (PREVACID) 30 MG capsule Take 30 mg by mouth 2 (two) times daily. 03/19/17  Yes [provider]  metoprolol tartrate (LOPRESSOR) 25 MG tablet Take 1 tablet (25 mg total) by mouth 2 (two) times daily. 04/11/17  Yes Glade Lloyd, MD  ranitidine (ZANTAC) 150 MG tablet Take 150 mg by mouth 2 (two) times daily.    Yes [provider]  ranolazine (RANEXA) 500 MG 12 hr tablet Take 1 tablet (500 mg total) by mouth 2 (two) times daily. 04/11/17  Yes Glade Lloyd, MD  ibuprofen (ADVIL,MOTRIN) 200 MG tablet Take 400 mg  by mouth every 6 (six) hours as needed for moderate pain.    [provider]  insulin lispro (HUMALOG) 100 UNIT/ML injection Inject 0-10 Units into the skin 3 (three) times daily with meals. Inject SQ per sliding scale 150-250 (5 units), 251-350(8 units), 351-450(10 units), >450=10 units    [provider]  metoCLOPramide (REGLAN) 5 MG tablet Take 1 tablet (5 mg total) by mouth 3 (three) times daily. 01/02/18 02/01/18  Noralee Stainhoi, Jennifer, DO  nitroGLYCERIN (NITROSTAT) 0.4 MG SL tablet Place 1 tablet (0.4 mg total) under the tongue every 5 (five) minutes as needed for chest pain. 04/11/17   Glade LloydAlekh, Kshitiz, MD  [facility] VER[facility] test strip Use to test blood sugar twice daily 09/24/17   [provider]  promethazine (PHENERGAN) 25 MG tablet Take 25-50 mg by mouth every 4 (four) hours as needed for nausea or vomiting.     [provider]  traMADol (ULTRAM) 50 MG tablet Take 100 mg by mouth every 6 (six) hours as needed for moderate pain.     [provider]    Family History Family History  Problem Relation Age of Onset  . COPD Mother   . Heart disease Mother   . Lung cancer Mother   . Retinal detachment Father   . Alcoholism Brother   . Heart disease Brother     . COPD Brother   . Diabetes Brother   . Hypertension Brother   . Stroke Brother     Social History Social History   Tobacco Use  . Smoking status: Never Smoker  . Smokeless tobacco: Never Used  Substance Use Topics  . Alcohol use: No    Frequency: Never    Comment: 01/07/2014 'might have a wine cooler a couple times/yr"  . Drug use: No     Allergies   Gabapentin; Nabumetone; Reglan [metoclopramide]; and Codeine   Review of Systems Review of Systems  Constitutional: Negative for fever.  Respiratory: Positive for shortness of breath (chronic).   Cardiovascular: Negative for chest pain.  Gastrointestinal: Positive for nausea. Negative for abdominal pain and vomiting.  All other systems reviewed and are negative.    Physical Exam Updated Vital Signs BP (!) 138/56   Pulse 91   Temp 97.9 F (36.6 C) (Oral)   Resp 20   Ht 6\' 3"  (1.905 m)   Wt (!) 140.6 kg   SpO2 95%   BMI 38.75 kg/m   Physical Exam  Constitutional: He is oriented to person, place, and time. He appears well-developed and well-nourished. No distress.  HENT:  Head: Normocephalic and atraumatic.  Right Ear: External ear normal.  Left Ear: External ear normal.  Nose: Nose normal.  Eyes: Right eye exhibits no discharge. Left eye exhibits no discharge.  Neck: Neck supple.  Cardiovascular: Normal rate, regular rhythm and normal heart sounds.  Pulmonary/Chest: Effort normal and breath sounds normal.  Abdominal: Soft. There is tenderness (mild, generalized).  Musculoskeletal: He exhibits no edema.  Neurological: He is alert and oriented to person, place, and time.  Skin: Skin is warm and dry. He is not diaphoretic.  Nursing note and vitals reviewed.    ED Treatments / Results  Labs (all labs ordered are listed, but only abnormal results are displayed) Labs Reviewed  URINALYSIS, ROUTINE W REFLEX MICROSCOPIC - Abnormal; Notable for the following components:      Result Value   Glucose, UA >=500  (*)    Hgb urine dipstick SMALL (*)    Ketones, ur  80 (*)    Protein, ur 30 (*)    All other components within normal limits  COMPREHENSIVE METABOLIC PANEL - Abnormal; Notable for the following components:   CO2 20 (*)    Glucose, Bld 371 (*)    Total Bilirubin 1.7 (*)    Anion gap 20 (*)    All other components within normal limits  BLOOD GAS, VENOUS - Abnormal; Notable for the following components:   pCO2, Ven 36.7 (*)    Acid-base deficit 3.1 (*)    All other components within normal limits  CBG MONITORING, ED - Abnormal; Notable for the following components:   Glucose-Capillary 319 (*)    All other components within normal limits  LIPASE, BLOOD  CBC WITH DIFFERENTIAL/PLATELET  CBG MONITORING, ED    EKG EKG Interpretation  Date/Time:  Monday February 03 2018 13:22:36 EDT Ventricular Rate:  88 PR Interval:    QRS Duration: 87 QT Interval:  395 QTC Calculation: 478 R Axis:   -11 Text Interpretation:  Sinus rhythm ST elevation, consider lateral injury Borderline prolonged QT interval Baseline wander in lead(s) V3 ** Poor data quality, interpretation may be adversely affected Confirmed by Pricilla LovelessGoldston, Lasheba Stevens [phone]([id number]) on 02/03/2018 1:33:21 PM   Radiology No results found.  Procedures .Critical Care Performed by: Pricilla LovelessGoldston, Emme Rosenau, MD Authorized by: Pricilla LovelessGoldston, Jessicalynn Deshong, MD   Critical care provider statement:    Critical care time (minutes):  30   Critical care time was exclusive of:  Separately billable procedures and treating other patients   Critical care was necessary to treat or prevent imminent or life-threatening deterioration of the following conditions:  Endocrine crisis   Critical care was time spent personally by me on the following activities:  Development of treatment plan with patient or surrogate, discussions with consultants, evaluation of patient's response to treatment, examination of patient, obtaining history from patient or surrogate, ordering and performing  treatments and interventions, ordering and review of laboratory studies, pulse oximetry, re-evaluation of patient's condition and review of old charts   (including critical care time)  Medications Ordered in ED Medications  insulin regular (NOVOLIN R,HUMULIN R) 100 Units in sodium chloride 0.9 % 100 mL (1 Units/mL) infusion (has no administration in time range)  potassium chloride 10 mEq in 100 mL IVPB (10 mEq Intravenous New Bag/Given 02/03/18 1429)  dextrose 5 %-0.45 % sodium chloride infusion ( Intravenous Hold 02/03/18 1415)  lactated ringers bolus 1,000 mL (1,000 mLs Intravenous New Bag/Given 02/03/18 1427)  promethazine (PHENERGAN) injection 25 mg (25 mg Intramuscular Given 02/03/18 1316)  sodium chloride 0.9 % bolus 1,000 mL (0 mLs Intravenous Stopped 02/03/18 1419)     Initial Impression / Assessment and Plan / ED Course  I have reviewed the triage vital signs and the nursing notes.  Pertinent labs & imaging results that were available during my care of the patient were reviewed by me and considered in my medical decision making (see chart for details).     Patient's lab work from earlier in the day have been reviewed.  Patient is having recurrent gastroparesis nausea/vomiting.  He does not appear significantly ill except he is tachypneic.  He is noted to have a little bit worsening acidosis to the point that his anion gap is 20 and bicarbonate 20.  Was pH is compensated, I think this represents the beginning of DKA.  Thus he was given IV fluids and will be started on insulin drip.  Potassium is okay.  Discussed with Dr. Marland McalpineSheikh, who will admit.  Final Clinical Impressions(s) / ED Diagnoses   Final diagnoses:  Diabetic ketoacidosis without coma associated with type 2 diabetes mellitus [facility])    ED Discharge Orders    None       Pricilla Loveless, MD 02/03/18 1436

## 2018-02-03 NOTE — ED Triage Notes (Addendum)
Per EMS pt from home and complains of prolonged nausea. Pt was here seen for same thing earlier today. Pt was diagnosed with gastroparesis earlier today.  Pt reports that he is not getting any better and wants to be seen again.  BP 108/72 HR 82 RR 22 O2 99% CBG 307 (Has not been taking insulin or eating anything per EMS)

## 2018-02-04 ENCOUNTER — Other Ambulatory Visit: Payer: Self-pay | Admitting: Pharmacist

## 2018-02-04 DIAGNOSIS — E1110 Type 2 diabetes mellitus with ketoacidosis without coma: Secondary | ICD-10-CM

## 2018-02-04 DIAGNOSIS — Z794 Long term (current) use of insulin: Secondary | ICD-10-CM

## 2018-02-04 LAB — CBC WITH DIFFERENTIAL/PLATELET
BASOS ABS: 0 10*3/uL (ref 0.0–0.1)
Basophils Relative: 0 %
EOS PCT: 2 %
Eosinophils Absolute: 0.1 10*3/uL (ref 0.0–0.7)
HCT: 39.2 % (ref 39.0–52.0)
Hemoglobin: 13.3 g/dL (ref 13.0–17.0)
LYMPHS PCT: 22 %
Lymphs Abs: 1.7 10*3/uL (ref 0.7–4.0)
MCH: 30.9 pg (ref 26.0–34.0)
MCHC: 33.9 g/dL (ref 30.0–36.0)
MCV: 91.2 fL (ref 78.0–100.0)
Monocytes Absolute: 0.7 10*3/uL (ref 0.1–1.0)
Monocytes Relative: 10 %
Neutro Abs: 4.9 10*3/uL (ref 1.7–7.7)
Neutrophils Relative %: 66 %
PLATELETS: 219 10*3/uL (ref 150–400)
RBC: 4.3 MIL/uL (ref 4.22–5.81)
RDW: 13 % (ref 11.5–15.5)
WBC: 7.5 10*3/uL (ref 4.0–10.5)

## 2018-02-04 LAB — COMPREHENSIVE METABOLIC PANEL
ALT: 15 U/L (ref 0–44)
AST: 17 U/L (ref 15–41)
Albumin: 2.8 g/dL — ABNORMAL LOW (ref 3.5–5.0)
Alkaline Phosphatase: 69 U/L (ref 38–126)
Anion gap: 9 (ref 5–15)
BUN: 15 mg/dL (ref 8–23)
CHLORIDE: 106 mmol/L (ref 98–111)
CO2: 27 mmol/L (ref 22–32)
Calcium: 8.5 mg/dL — ABNORMAL LOW (ref 8.9–10.3)
Creatinine, Ser: 0.95 mg/dL (ref 0.61–1.24)
Glucose, Bld: 137 mg/dL — ABNORMAL HIGH (ref 70–99)
POTASSIUM: 3.4 mmol/L — AB (ref 3.5–5.1)
Sodium: 142 mmol/L (ref 135–145)
TOTAL PROTEIN: 6 g/dL — AB (ref 6.5–8.1)
Total Bilirubin: 0.5 mg/dL (ref 0.3–1.2)

## 2018-02-04 LAB — BASIC METABOLIC PANEL
Anion gap: 7 (ref 5–15)
BUN: 15 mg/dL (ref 8–23)
CHLORIDE: 106 mmol/L (ref 98–111)
CO2: 25 mmol/L (ref 22–32)
CREATININE: 0.86 mg/dL (ref 0.61–1.24)
Calcium: 8.3 mg/dL — ABNORMAL LOW (ref 8.9–10.3)
GFR calc Af Amer: >60 mL/min (ref >60)
GFR calc non Af Amer: >60 mL/min (ref >60)
Glucose, Bld: 144 mg/dL — ABNORMAL HIGH (ref 70–99)
Potassium: 3.4 mmol/L — ABNORMAL LOW (ref 3.5–5.1)
SODIUM: 138 mmol/L (ref 135–145)

## 2018-02-04 LAB — GLUCOSE, CAPILLARY
GLUCOSE-CAPILLARY: 141 mg/dL — AB (ref 70–99)
GLUCOSE-CAPILLARY: 147 mg/dL — AB (ref 70–99)
GLUCOSE-CAPILLARY: 151 mg/dL — AB (ref 70–99)
GLUCOSE-CAPILLARY: 157 mg/dL — AB (ref 70–99)
Glucose-Capillary: 151 mg/dL — ABNORMAL HIGH (ref 70–99)
Glucose-Capillary: 159 mg/dL — ABNORMAL HIGH (ref 70–99)
Glucose-Capillary: 131 mg/dL — ABNORMAL HIGH (ref 70–99)
Glucose-Capillary: 153 mg/dL — ABNORMAL HIGH (ref 70–99)
Glucose-Capillary: 134 mg/dL — ABNORMAL HIGH (ref 70–99)

## 2018-02-04 LAB — MAGNESIUM: MAGNESIUM: 1.8 mg/dL (ref 1.7–2.4)

## 2018-02-04 LAB — PHOSPHORUS: PHOSPHORUS: 3.2 mg/dL (ref 2.5–4.6)

## 2018-02-04 MED ORDER — INSULIN DETEMIR 100 UNIT/ML ~~LOC~~ SOLN
25.0000 [IU] | Freq: Two times a day (BID) | SUBCUTANEOUS | Status: DC
  Administered 2018-02-04: 25 [IU] via SUBCUTANEOUS
  Filled 2018-02-04: qty 0.25

## 2018-02-04 MED ORDER — PROMETHAZINE HCL 25 MG/ML IJ SOLN
12.5000 mg | Freq: Once | INTRAMUSCULAR | Status: AC
  Administered 2018-02-04: 12.5 mg via INTRAVENOUS
  Filled 2018-02-04: qty 1

## 2018-02-04 MED ORDER — INSULIN NPH ISOPHANE & REGULAR (70-30) 100 UNIT/ML ~~LOC~~ SUSP: 25.0000 [IU] | Freq: Two times a day (BID) | SUBCUTANEOUS | 0 refills | Status: DC

## 2018-02-04 MED ORDER — POTASSIUM CHLORIDE CRYS ER 20 MEQ PO TBCR
40.0000 meq | EXTENDED_RELEASE_TABLET | Freq: Once | ORAL | Status: AC
  Administered 2018-02-04: 40 meq via ORAL
  Filled 2018-02-04: qty 2

## 2018-02-04 MED ORDER — INSULIN ASPART 100 UNIT/ML ~~LOC~~ SOLN
0.0000 [IU] | Freq: Three times a day (TID) | SUBCUTANEOUS | Status: DC
  Administered 2018-02-04: 3 [IU] via SUBCUTANEOUS

## 2018-02-04 NOTE — Care Management Note (Signed)
Case Management Note  Patient Details  Name: Beckie BusingJohn D Hitchman MRN: [id number] Date of Birth: [birth year]  Subjective/Objective:                  Discharge- per Burnis KingfisherAtkia Hall ,rn pt [facility] and [facility]/alerted that he is being dcd/[facility] through [facility] is following for insulin needs/  Action/Plan: home  Expected Discharge Date:  02/04/18               Expected Discharge Plan:  Home w Hospice Care  In-House Referral:     Discharge planning Services  CM Consult  Post Acute Care Choice:    Choice offered to:  Patient  DME Arranged:    DME Agency:     HH Arranged:  RN HH Agency:  [facility]  Status of Service:  Completed, signed off  If discussed at Long Length of Stay Meetings, dates discussed:    Additional Comments:  Golda AcreDavis, Rhonda Lynn, RN 02/04/2018, 10:27 AM

## 2018-02-04 NOTE — Consult Note (Signed)
   [facility]   02/04/2018  Samuel BusingJohn D [name] [birth year] [id number]   [name] is [facility] Pharm[facility] team for medication affordability. Please see chart review then encounters for patient outreach details. He is also active with [facility] (home based outpatient palliative program administered by [facility]). Confirmed with Cheri with [facility] that they will continue to follow patient post hospital discharge.   Spoke with inpatient RNCM to discuss above notes.    Raiford NobleAtika Hall, MSN-Ed, RN,BSN [facility] Hospital Liaison [phone]

## 2018-02-04 NOTE — Patient Outreach (Signed)
[facility]  [facility]   02/04/2018  Samuel [name] 1950-11-10 [id number]  Subjective: Patient called to discuss his insulin therapy after being discharged this morning from the hospital.  The patient was hospitalized for DKA secondary to running out of his insulin.  HIPAA identifiers were obtained.  Patient is a 67 year old male with multiple medical conditions including but not limited to: Allergic rhinitis, COPD, type 2 diabetes, hypertension, gastroparesis, GERD, hyperlipidemia, obesity, pulmonary embolism, and below the knee amputation.  Patient has been hospitalized multiple times in the last few weeks (mostly for nausea and vomiting due to gastroparesis).  His most recent hospitalization was for DKA.  Objective:  HgA1c- 9.3%  Encounter Medications: Outpatient Encounter Medications as of 02/04/2018  Medication Sig Note  . amLODipine (NORVASC) 10 MG tablet Take 1 tablet (10 mg total) by mouth daily. 02/03/2018: .  . apixaban (ELIQUIS) 5 MG TABS tablet Take 1 tablet (5 mg total) by mouth 2 (two) times daily. 02/03/2018: pt's wife claims takes both Plavix and Eliquis every day    . aspirin 81 MG EC tablet Take 1 tablet (81 mg total) by mouth daily.   [name]. atorvastatin (LIPITOR) 80 MG tablet Take 1 tablet (80 mg total) by mouth daily at 6 PM. 02/03/2018: .  . clopidogrel (PLAVIX) 75 MG tablet Take 1 tablet (75 mg total) by mouth daily. 02/03/2018: pt's wife claims takes both Plavix and Eliquis every day    . ferrous sulfate 325 (65 FE) MG tablet Take 325 mg by mouth every evening.    . furosemide (LASIX) 20 MG tablet Take 1 tablet (20 mg total) by mouth daily. 02/03/2018: .  . gabapentin (NEURONTIN) 300 MG capsule Take 300 mg by mouth 4 (four) times daily.  02/03/2018: .  . ibuprofen (ADVIL,MOTRIN) 200 MG tablet Take 400 mg by mouth every 6 (six) hours as needed for moderate pain.   [name]. insulin lispro (HUMALOG) 100 UNIT/ML injection Inject 0-10 Units into  the skin 3 (three) times daily with meals. Inject SQ per sliding scale 150-250 (5 units), 251-350(8 units), 351-450(10 units), >450=10 units 02/03/2018: .  . insulin NPH-regular Human (NOVOLIN 70/30) (70-30) 100 UNIT/ML injection Inject 25 Units into the skin 2 (two) times daily with a meal.   . lansoprazole (PREVACID) 30 MG capsule Take 30 mg by mouth 2 (two) times daily. 02/03/2018: .  . metoprolol tartrate (LOPRESSOR) 25 MG tablet Take 1 tablet (25 mg total) by mouth 2 (two) times daily.   . nitroGLYCERIN (NITROSTAT) 0.4 MG SL tablet Place 1 tablet (0.4 mg total) under the tongue every 5 (five) minutes as needed for chest pain.   [name] test strip Use to test blood sugar twice daily   . promethazine (PHENERGAN) 25 MG tablet Take 25-50 mg by mouth every 4 (four) hours as needed for nausea or vomiting.  02/03/2018: .  . ranitidine (ZANTAC) 150 MG tablet Take 150 mg by mouth 2 (two) times daily.    . ranolazine (RANEXA) 500 MG 12 hr tablet Take 1 tablet (500 mg total) by mouth 2 (two) times daily. 02/03/2018: .  . traMADol (ULTRAM) 50 MG tablet Take 100 mg by mouth every 6 (six) hours as needed for moderate pain.  02/03/2018: .  . metoCLOPramide (REGLAN) 5 MG tablet Take 1 tablet (5 mg total) by mouth 3 (three) times daily.   . [DISCONTINUED] amLODipine (NORVASC) tablet 10 mg    . [DISCONTINUED] apixaban (ELIQUIS) tablet 5  mg    . [DISCONTINUED] aspirin EC tablet 81 mg    . [DISCONTINUED] atorvastatin (LIPITOR) tablet 80 mg    . [DISCONTINUED] clopidogrel (PLAVIX) tablet 75 mg    . [DISCONTINUED] famotidine (PEPCID) tablet 20 mg    . [DISCONTINUED] ferrous sulfate tablet 325 mg    . [DISCONTINUED] gabapentin (NEURONTIN) capsule 300 mg    . [DISCONTINUED] insulin aspart (novoLOG) injection 0-20 Units    . [DISCONTINUED] insulin detemir (LEVEMIR) injection 25 Units    . [DISCONTINUED] metoprolol tartrate (LOPRESSOR) tablet 25 mg    . [DISCONTINUED] nitroGLYCERIN (NITROSTAT) SL tablet 0.4 mg     . [DISCONTINUED] ondansetron (ZOFRAN) injection 4 mg    . [DISCONTINUED] pantoprazole (PROTONIX) EC tablet 40 mg    . [DISCONTINUED] ranolazine (RANEXA) 12 hr tablet 500 mg    . [DISCONTINUED] traMADol (ULTRAM) tablet 100 mg     No facility-administered encounter medications on file as of 02/04/2018.     Functional Status: In your present state of health, do you have any difficulty performing the following activities: 02/03/2018 12/31/2017  Hearing? N N  Vision? Y Y  Comment cataracts in both eyes cataracts in both eyes  Difficulty concentrating or making decisions? N N  Walking or climbing stairs? Y Y  Comment - -  Dressing or bathing? Y N  Doing errands, shopping? Y N  Comment - -  Quarry managerreparing Food and eating ? - -  Using the Toilet? - -  In the past six months, have you accidently leaked urine? - -  Do you have problems with loss of bowel control? - -  Managing your Medications? - -  Comment - -  Managing your Finances? - -  Housekeeping or managing your Housekeeping? - -  Comment - -  Some recent data might be hidden    Assessment: Patient's medications were reviewed with his caregiver [name].  ASSESSMENT: Date Discharged from Hospital: 02/04/18 Date Medication Reconciliation Performed: 02/04/2018  Medications Discontinued at Discharge:  Levemir  New Medications at Discharge:    Novolog 70/30    Patient was recently discharged from hospital and all medications have been reviewed  Drugs sorted by system:  Neurologic/Psychologic: gabapentin  Cardiovascular: Amlodipine, Eliquis, Aspirin, Atorvastatin, Clopidogrel, Furosemide, metoprolol, Nitroglycerin,  Pulmonary/Allergy:  Gastrointestinal: Lansoprazole, Metoclopramide, promethazine, ranitidine,   Endocrine: Humalog, Novolin 70/30 Renal:  Topical:  Pain: Ibuprofen, tramadol,  Vitamins/Minerals: Ferrous sulfate  Drug Interaction- Humalog/Novolin 70/30-increased risk of hypoglycemia with rapid  acting and regular insulin being dosed at meal time.  The combination of 70/30 insulin therapy and Humalog was verified with the prescriber (Dr. Alvino Chapelhoi) via telephone.  Patient was counseled on hypoglycemia management.   Patient reported finding the application for [employer] Application that we have mailed to his home at least three times.  Patient said he will put the application in the mail today.  Patient has an endocrinologist's appointment 02/18/18.    Plan: Follow up with the patient after his Endocrinologist appointment.   Beecher McardleKatina J. Zong Mcquarrie, PharmD, BCACP [facility] Clinical Pharmacist [phone]

## 2018-02-04 NOTE — Discharge Summary (Signed)
Physician Discharge Summary  DRAYCEN LEICHTER ZOX:[id number] DOB: 1951/05/13 DOA: 02/03/2018  PCP: [name], MD  Admit date: 02/03/2018 Discharge date: 02/04/2018  Admitted From: Home Disposition:  Home  Recommendations for Outpatient Follow-up:  1. Follow up with PCP in 1 week 2. Follow up on final blood culture and urine culture results  3. Question need for eliquis, aspirin, and plavix as triple therapy for CAD and hx PE. Patient to follow up closely with PCP and cardiology to determine if it is appropriate to discontinue an antiplatelet agent.   Discharge Condition: Stable CODE STATUS: DNR  Diet recommendation: Carb modified, heart healthy   Brief/Interim Summary: Samuel [name] is a 67 y.o. male with medical history significant of anxiety, depression, history of GERD and gastroparesis, history of left BKA status post Charcot foot and diabetes, history of arthritis, history of GERD, peripheral neuropathy, CAD and MI, OSA, type 2 diabetes mellitus and other comorbidities who presents with a chief complaint of nausea vomiting. Nausea and Vomiting started around 3 this AM. A little SOB but no CP. Woke with N/V. Missed several Insulin doses this week because he can't afford long acting insulin. No Abodminal Pain. Had some buring and discomfort in urine. Eating ok earlier but now vomiting. Has blurred vision but attributes it to cataracts. No lightheadedness or dizziness. No blood in vomit but states its bilious. Patient was seen earlier this a.m. in the ED for similar symptoms went home and felt worse so he presented for further evaluation [facility] was called to admit this patient for DKA.  Patient was treated with IV insulin and DKA order set. His anion gap closed overnight and he was transitioned to long-acting insulin and sliding scale. He states that he cannot afford the long acting insulin any longer, which is why he has been missing his doses. He is familiar with Novolin 70/30 which is much more  affordable at [facility]. On day of discharge, he is feeling well, without any chest pain, SOB, nausea, vomiting, abdominal pain. He ate breakfast without issues today. All questions and concerns answered. He is to follow up closely with his PCP.   Discharge Diagnoses:  Principal Problem:   DKA, type 2 (HCC) Active Problems:   GERD   Pulmonary embolism (HCC)   CAD S/P DES PCI to mLAD: Xience DES 2.75 x 15 (3.0 mm)   Essential hypertension   Hyperlipidemia due to type 2 diabetes mellitus (HCC)   Gastroparesis   Intractable nausea and vomiting   DM (diabetes mellitus), type 2 (HCC)   Diabetes mellitus type 2, uncontrolled (HCC)   Nausea & vomiting   Obesity (BMI 30-39.9)   S/P BKA (below knee amputation) (HCC)   COPD (chronic obstructive pulmonary disease) (HCC)   Uncontrolled diabetes mellitus (HCC)   Discharge Instructions  Discharge Instructions    Call MD for:   Complete by:  As directed    Blood sugar > 300   Call MD for:  difficulty breathing, headache or visual disturbances   Complete by:  As directed    Call MD for:  extreme fatigue   Complete by:  As directed    Call MD for:  hives   Complete by:  As directed    Call MD for:  persistant dizziness or light-headedness   Complete by:  As directed    Call MD for:  persistant nausea and vomiting   Complete by:  As directed    Call MD for:  severe uncontrolled pain  Complete by:  As directed    Call MD for:  temperature >100.4   Complete by:  As directed    Diet - low sodium heart healthy   Complete by:  As directed    Diet Carb Modified   Complete by:  As directed    Discharge instructions   Complete by:  As directed    You were cared for by a hospitalist during your hospital stay. If you have any questions about your discharge medications or the care you received while you were in the hospital after you are discharged, you can call the unit and ask to speak with the hospitalist on call if the hospitalist that took  care of you is not available. Once you are discharged, your primary care physician will handle any further medical issues. Please note that NO REFILLS for any discharge medications will be authorized once you are discharged, as it is imperative that you return to your primary care physician (or establish a relationship with a primary care physician if you do not have one) for your aftercare needs so that they can reassess your need for medications and monitor your lab values.   Increase activity slowly   Complete by:  As directed      Allergies as of 02/04/2018      Reactions   Gabapentin Hives, Itching, Rash   REACTION: rash/hives (per patient, was a reaction to an inactive ingredient in another mgf brand). The patient stated that he does take this medication now and it doesn't cause a rash any more.   Nabumetone Itching, Nausea Only, Rash   Reglan [metoclopramide] Hives, Nausea Only   Codeine Other (See Comments)   GI UPSET - hives      Medication List    STOP taking these medications   insulin detemir 100 UNIT/ML injection Commonly known as:  LEVEMIR     TAKE these medications   amLODipine 10 MG tablet Commonly known as:  NORVASC Take 1 tablet (10 mg total) by mouth daily.   apixaban 5 MG Tabs tablet Commonly known as:  ELIQUIS Take 1 tablet (5 mg total) by mouth 2 (two) times daily.   aspirin 81 MG EC tablet Take 1 tablet (81 mg total) by mouth daily.   atorvastatin 80 MG tablet Commonly known as:  LIPITOR Take 1 tablet (80 mg total) by mouth daily at 6 PM.   clopidogrel 75 MG tablet Commonly known as:  PLAVIX Take 1 tablet (75 mg total) by mouth daily.   ferrous sulfate 325 (65 FE) MG tablet Take 325 mg by mouth every evening.   furosemide 20 MG tablet Commonly known as:  LASIX Take 1 tablet (20 mg total) by mouth daily. What changed:  how much to take   gabapentin 300 MG capsule Commonly known as:  NEURONTIN Take 300 mg by mouth 4 (four) times daily.    ibuprofen 200 MG tablet Commonly known as:  ADVIL,MOTRIN Take 400 mg by mouth every 6 (six) hours as needed for moderate pain.   insulin lispro 100 UNIT/ML injection Commonly known as:  HUMALOG Inject 0-10 Units into the skin 3 (three) times daily with meals. Inject SQ per sliding scale 150-250 (5 units), 251-350(8 units), 351-450(10 units), >450=10 units   insulin NPH-regular Human (70-30) 100 UNIT/ML injection Commonly known as:  NOVOLIN 70/30 Inject 25 Units into the skin 2 (two) times daily with a meal.   lansoprazole 30 MG capsule Commonly known as:  PREVACID Take 30 mg  by mouth 2 (two) times daily.   metoCLOPramide 5 MG tablet Commonly known as:  REGLAN Take 1 tablet (5 mg total) by mouth 3 (three) times daily.   metoprolol tartrate 25 MG tablet Commonly known as:  LOPRESSOR Take 1 tablet (25 mg total) by mouth 2 (two) times daily.   nitroGLYCERIN 0.4 MG SL tablet Commonly known as:  NITROSTAT Place 1 tablet (0.4 mg total) under the tongue every 5 (five) minutes as needed for chest pain.   ONETOUCH VERIO test strip Generic drug:  glucose blood Use to test blood sugar twice daily   promethazine 25 MG tablet Commonly known as:  PHENERGAN Take 25-50 mg by mouth every 4 (four) hours as needed for nausea or vomiting.   ranitidine 150 MG tablet Commonly known as:  ZANTAC Take 150 mg by mouth 2 (two) times daily.   ranolazine 500 MG 12 hr tablet Commonly known as:  RANEXA Take 1 tablet (500 mg total) by mouth 2 (two) times daily.   traMADol 50 MG tablet Commonly known as:  ULTRAM Take 100 mg by mouth every 6 (six) hours as needed for moderate pain.      Follow-up Information    Pearson GrippeKim, James, MD. Schedule an appointment as soon as possible for a visit in 1 week(s).   Specialty:  Internal Medicine Contact information: [address] [place] [address] [phone]          Allergies  Allergen Reactions  . Gabapentin Hives, Itching and  Rash    REACTION: rash/hives (per patient, was a reaction to an inactive ingredient in another mgf brand). The patient stated that he does take this medication now and it doesn't cause a rash any more.  . Nabumetone Itching, Nausea Only and Rash  . Reglan [Metoclopramide] Hives and Nausea Only  . Codeine Other (See Comments)    GI UPSET - hives    Consultations:  None   Procedures/Studies: Portable Chest X-ray (1 View)  Result Date: 02/03/2018 CLINICAL DATA:  Diabetic ketoacidosis EXAM: PORTABLE CHEST 1 VIEW COMPARISON:  01/18/2018 FINDINGS: Slightly low lung volumes with minimal atelectasis at the left lung base. No acute pulmonary consolidation, CHF, effusion or pneumothorax. Heart size and mediastinal contours are within normal limits. The visualized skeletal structures are unremarkable. IMPRESSION: No active disease. Electronically Signed   By: Tollie Ethavid  Kwon M.D.   On: 02/03/2018 19:10    Discharge Exam: Vitals:   02/04/18 0400 02/04/18 0800  BP: 131/61 (!) 132/55  Pulse: 76   Resp: 17 16  Temp:  98 F (36.7 C)  SpO2: 96% 97%    General: Pt is alert, awake, not in acute distress Cardiovascular: RRR, S1/S2 +, no rubs, no gallops Respiratory: CTA bilaterally, no wheezing, no rhonchi Abdominal: Soft, NT, ND, bowel sounds + Extremities: no edema, no cyanosis, +left BKA     The results of significant diagnostics from this hospitalization (including imaging, microbiology, ancillary and laboratory) are listed below for reference.     Microbiology: Recent Results (from the past 240 hour(s))  MRSA PCR Screening     Status: None   Collection Time: 02/03/18  6:18 PM  Result Value Ref Range Status   MRSA by PCR NEGATIVE NEGATIVE Final    Comment:        The GeneXpert MRSA Assay (FDA approved for NASAL specimens only), is one component of a comprehensive MRSA colonization surveillance program. It is not intended to diagnose MRSA infection nor to guide or monitor treatment  for MRSA infections. Performed at [facility], [address], [place], [place] [address]      Labs: BNP (last 3 results) Recent Labs    11/01/17 1128  BNP 90.8   Basic Metabolic Panel: Recent Labs  Lab 02/03/18 1244 02/03/18 1806 02/03/18 2207 02/04/18 0214 02/04/18 0648  NA 139 139 139 138 142  K 4.8 4.1 3.6 3.4* 3.4*  CL 99 103 105 106 106  CO2 20* 22 23 25 27   GLUCOSE 371* 254* 163* 144* 137*  BUN 18 16 15 15 15   CREATININE 1.20 1.04 0.98 0.86 0.95  CALCIUM 9.0 8.6* 8.5* 8.3* 8.5*  MG  --   --   --   --  1.8  PHOS  --   --   --   --  3.2   Liver Function Tests: Recent Labs  Lab 02/03/18 1244 02/04/18 0648  AST 21 17  ALT 20 15  ALKPHOS 95 69  BILITOT 1.7* 0.5  PROT 7.6 6.0*  ALBUMIN 3.6 2.8*   Recent Labs  Lab 02/03/18 0416 02/03/18 1244  LIPASE 20 19   No results for input(s): AMMONIA in the last 168 hours. CBC: Recent Labs  Lab 02/03/18 0416 02/03/18 1244 02/04/18 0648  WBC 9.4 7.9 7.5  NEUTROABS 8.0* 6.8 4.9  HGB 15.1 15.0 13.3  HCT 43.2 44.2 39.2  MCV 90.0 89.8 91.2  PLT 237 262 219   Cardiac Enzymes: No results for input(s): CKTOTAL, CKMB, CKMBINDEX, TROPONINI in the last 168 hours. BNP: Invalid input(s): POCBNP CBG: Recent Labs  Lab 02/04/18 0358 02/04/18 0457 02/04/18 0601 02/04/18 0651 02/04/18 0741  GLUCAP 131* 153* 157* 147* 134*   D-Dimer No results for input(s): DDIMER in the last 72 hours. Hgb A1c Recent Labs    02/03/18 1244  HGBA1C 10.9*   Lipid Profile No results for input(s): CHOL, HDL, LDLCALC, TRIG, CHOLHDL, LDLDIRECT in the last 72 hours. Thyroid function studies No results for input(s): TSH, T4TOTAL, T3FREE, THYROIDAB in the last 72 hours.  Invalid input(s): FREET3 Anemia work up No results for input(s): VITAMINB12, FOLATE, FERRITIN, TIBC, IRON, RETICCTPCT in the last 72 hours. Urinalysis    Component Value Date/Time   COLORURINE YELLOW 02/03/2018 1754   APPEARANCEUR CLEAR  02/03/2018 1754   LABSPEC 1.026 02/03/2018 1754   PHURINE 5.0 02/03/2018 1754   GLUCOSEU >=500 (A) 02/03/2018 1754   HGBUR SMALL (A) 02/03/2018 1754   BILIRUBINUR NEGATIVE 02/03/2018 1754   KETONESUR 80 (A) 02/03/2018 1754   PROTEINUR 30 (A) 02/03/2018 1754   UROBILINOGEN 1.0 04/05/2015 1155   NITRITE NEGATIVE 02/03/2018 1754   LEUKOCYTESUR NEGATIVE 02/03/2018 1754   Sepsis Labs Invalid input(s): PROCALCITONIN,  WBC,  LACTICIDVEN Microbiology Recent Results (from the past 240 hour(s))  MRSA PCR Screening     Status: None   Collection Time: 02/03/18  6:18 PM  Result Value Ref Range Status   MRSA by PCR NEGATIVE NEGATIVE Final    Comment:        The GeneXpert MRSA Assay (FDA approved for NASAL specimens only), is one component of a comprehensive MRSA colonization surveillance program. It is not intended to diagnose MRSA infection nor to guide or monitor treatment for MRSA infections. Performed at [facility], [address] [place], [place] [address]      Patient was seen and examined on the day of discharge and was found to be in stable condition. Time coordinating discharge: 40 minutes including assessment and coordination of care,  as well as examination of the patient.   SIGNED:  Noralee StainJennifer Delshawn Stech, DO [facility] Pager [phone]  If 7PM-7AM, please contact night-coverage [email or web address] Password [email or web address] 02/04/2018, 9:07 AM

## 2018-02-04 NOTE — Care Management Note (Signed)
Case Management Note  Patient Details  Name: Beckie BusingJohn D Enberg MRN: [id number] Date of Birth: 01/08/1951  Subjective/Objective:                  67 y.o. male with medical history significant of anxiety, depression, history of GERD and gastroparesis, history of left BKA status post Charcot foot and diabetes, history of arthritis, history of GERD, peripheral neuropathy, CAD and MI, OSA, type 2 diabetes mellitus and other comorbidities who presents with a chief complaint of nausea vomiting  Action/Plan: Following for progression and cm needs  Expected Discharge Date:  (unknown)               Expected Discharge Plan:  Home/Self Care  In-House Referral:     Discharge planning Services  CM Consult  Post Acute Care Choice:    Choice offered to:     DME Arranged:    DME Agency:     HH Arranged:    HH Agency:     Status of Service:  In process, will continue to follow  If discussed at Long Length of Stay Meetings, dates discussed:    Additional Comments:  Golda AcreDavis, Dondi Aime Lynn, RN 02/04/2018, 8:58 AM

## 2018-02-05 ENCOUNTER — Encounter (HOSPITAL_COMMUNITY): Payer: Self-pay

## 2018-02-05 ENCOUNTER — Other Ambulatory Visit: Payer: Self-pay

## 2018-02-05 ENCOUNTER — Ambulatory Visit: Payer: PPO | Admitting: Emergency Medicine

## 2018-02-05 ENCOUNTER — Emergency Department (HOSPITAL_COMMUNITY)
Admission: EM | Admit: 2018-02-05 | Discharge: 2018-02-05 | Disposition: A | Discharge status: Home / Self Care | Payer: PPO | Attending: Emergency Medicine | Admitting: Emergency Medicine

## 2018-02-05 DIAGNOSIS — R110 Nausea: Secondary | ICD-10-CM | POA: Diagnosis not present

## 2018-02-05 DIAGNOSIS — K3184 Gastroparesis: Secondary | ICD-10-CM | POA: Insufficient documentation

## 2018-02-05 DIAGNOSIS — E119 Type 2 diabetes mellitus without complications: Secondary | ICD-10-CM | POA: Insufficient documentation

## 2018-02-05 DIAGNOSIS — Z794 Long term (current) use of insulin: Secondary | ICD-10-CM | POA: Diagnosis not present

## 2018-02-05 DIAGNOSIS — Z955 Presence of coronary angioplasty implant and graft: Secondary | ICD-10-CM | POA: Insufficient documentation

## 2018-02-05 DIAGNOSIS — Z7982 Long term (current) use of aspirin: Secondary | ICD-10-CM | POA: Diagnosis not present

## 2018-02-05 DIAGNOSIS — I10 Essential (primary) hypertension: Secondary | ICD-10-CM | POA: Diagnosis not present

## 2018-02-05 DIAGNOSIS — J449 Chronic obstructive pulmonary disease, unspecified: Secondary | ICD-10-CM | POA: Insufficient documentation

## 2018-02-05 DIAGNOSIS — R531 Weakness: Secondary | ICD-10-CM | POA: Diagnosis not present

## 2018-02-05 DIAGNOSIS — Z7902 Long term (current) use of antithrombotics/antiplatelets: Secondary | ICD-10-CM | POA: Insufficient documentation

## 2018-02-05 DIAGNOSIS — Z7901 Long term (current) use of anticoagulants: Secondary | ICD-10-CM | POA: Insufficient documentation

## 2018-02-05 DIAGNOSIS — I252 Old myocardial infarction: Secondary | ICD-10-CM | POA: Diagnosis not present

## 2018-02-05 DIAGNOSIS — I2510 Atherosclerotic heart disease of native coronary artery without angina pectoris: Secondary | ICD-10-CM | POA: Diagnosis not present

## 2018-02-05 LAB — CBC WITH DIFFERENTIAL/PLATELET
BASOS ABS: 0 10*3/uL (ref 0.0–0.1)
Basophils Relative: 0 %
EOS ABS: 0.1 10*3/uL (ref 0.0–0.7)
EOS PCT: 1 %
HCT: 41.3 % (ref 39.0–52.0)
Hemoglobin: 14.1 g/dL (ref 13.0–17.0)
LYMPHS ABS: 0.5 10*3/uL — AB (ref 0.7–4.0)
Lymphocytes Relative: 9 %
MCH: 30.8 pg (ref 26.0–34.0)
MCHC: 34.1 g/dL (ref 30.0–36.0)
MCV: 90.2 fL (ref 78.0–100.0)
Monocytes Absolute: 0.5 10*3/uL (ref 0.1–1.0)
Monocytes Relative: 9 %
Neutro Abs: 4.6 10*3/uL (ref 1.7–7.7)
Neutrophils Relative %: 81 %
PLATELETS: 195 10*3/uL (ref 150–400)
RBC: 4.58 MIL/uL (ref 4.22–5.81)
RDW: 12.8 % (ref 11.5–15.5)
WBC: 5.7 10*3/uL (ref 4.0–10.5)

## 2018-02-05 LAB — URINE CULTURE: Culture: NO GROWTH

## 2018-02-05 LAB — URINALYSIS, ROUTINE W REFLEX MICROSCOPIC
BILIRUBIN URINE: NEGATIVE
Glucose, UA: >=500 mg/dL — AB
KETONES UR: 80 mg/dL — AB
LEUKOCYTES UA: NEGATIVE
NITRITE: NEGATIVE
PROTEIN: 30 mg/dL — AB
Specific Gravity, Urine: 1.026 (ref 1.005–1.030)
pH: 5 (ref 5.0–8.0)

## 2018-02-05 LAB — COMPREHENSIVE METABOLIC PANEL
ALT: 15 U/L (ref 0–44)
ANION GAP: 14 (ref 5–15)
AST: 17 U/L (ref 15–41)
Albumin: 3.4 g/dL — ABNORMAL LOW (ref 3.5–5.0)
Alkaline Phosphatase: 90 U/L (ref 38–126)
BUN: 11 mg/dL (ref 8–23)
CHLORIDE: 99 mmol/L (ref 98–111)
CO2: 25 mmol/L (ref 22–32)
CREATININE: 0.93 mg/dL (ref 0.61–1.24)
Calcium: 9.1 mg/dL (ref 8.9–10.3)
Glucose, Bld: 233 mg/dL — ABNORMAL HIGH (ref 70–99)
POTASSIUM: 4.1 mmol/L (ref 3.5–5.1)
Sodium: 138 mmol/L (ref 135–145)
Total Bilirubin: 1.2 mg/dL (ref 0.3–1.2)
Total Protein: 6.8 g/dL (ref 6.5–8.1)

## 2018-02-05 LAB — LIPASE, BLOOD: LIPASE: 19 U/L (ref 11–51)

## 2018-02-05 LAB — CBG MONITORING, ED: GLUCOSE-CAPILLARY: 217 mg/dL — AB (ref 70–99)

## 2018-02-05 MED ORDER — PROCHLORPERAZINE EDISYLATE 10 MG/2ML IJ SOLN
10.0000 mg | Freq: Once | INTRAMUSCULAR | Status: AC
  Administered 2018-02-05: 10 mg via INTRAVENOUS
  Filled 2018-02-05: qty 2

## 2018-02-05 MED ORDER — PROMETHAZINE HCL 25 MG/ML IJ SOLN
12.5000 mg | Freq: Once | INTRAMUSCULAR | Status: AC
  Administered 2018-02-05: 12.5 mg via INTRAVENOUS
  Filled 2018-02-05: qty 1

## 2018-02-05 NOTE — ED Provider Notes (Signed)
[facility]-EMERGENCY DEPT Provider Note   CSN: [id number] Arrival date & time: 02/05/18  1035     History   Chief Complaint Chief Complaint  Patient presents with  . Nausea  . Recent admission    HPI Samuel [name] is a [age] y.o. male presenting for evaluation of nausea.   Patient states that he developed acute onset nausea and retching this morning.  He denies vomiting.  He states he ate a very small meal of steak and potatoes last night.  He had a shot of his IM Phenergan at home, with mild improvement of the nausea, but he was still retching.  He denies fevers, chills, chest pain, shortness of breath, abdominal pain, urinary symptoms, abnormal bowel movements.  He states this feels consistent with previous episodes of gastroparesis.  He reports his blood sugars were improved this morning at 270s.  He has had nothing to eat or drink today.  Additional history obtained from chart review, patient recently admitted for DKA.  Discharged yesterday after anion gap and bicarb had improved.  Insulin prescription changed.  HPI  Past Medical History:  Diagnosis Date  . Anxiety   . Arthritis    "all over"   . CAD (coronary artery disease)   . Charcot's joint    "left foot"  . 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 Depression   . Gastroparesis   . GERD (gastroesophageal reflux disease)   . H/O hiatal hernia   . Hyperlipidemia   . Hypertension   . Myocardial infarction (HCC) 2017/03/27   around this date  . OSA (obstructive sleep apnea)    "not bad enough for a mask"  . Peripheral neuropathy   . Peripheral vascular disease (HCC)   . PONV (postoperative nausea and vomiting)   . Pulmonary embolism (HCC)    hx. of 2012  . Shortness of breath    exertion  . Type II diabetes mellitus [facility](HCC)     Patient Active Problem List   Diagnosis Date Noted  . DKA, type 2 (HCC) 02/03/2018  . DOE (dyspnea on exertion) 01/24/2018  . Uncontrolled  diabetes mellitus (HCC) 09/23/2017  . DKA, type 2, not at goal [facility](HCC) 09/22/2017  . S/P BKA (below knee amputation) (HCC) 04/19/2017  . COPD (chronic obstructive pulmonary disease) (HCC) 04/19/2017  . NSTEMI (non-ST elevated myocardial infarction) (HCC) 04/07/2017  . Osteomyelitis of foot (HCC)   . Protein-calorie malnutrition, severe (HCC) 03/08/2015  . Osteomyelitis of foot, acute (HCC) 03/07/2015  . Diabetes mellitus type 2, uncontrolled (HCC) 03/07/2015  . Normocytic normochromic anemia 03/07/2015  . Obesity (BMI 30-39.9) 03/07/2015  . Nausea & vomiting   . DM (diabetes mellitus), type 2 (HCC) 01/07/2014  . Chronic osteomyelitis involving lower leg (HCC) 01/07/2014  . Vomiting 12/22/2013  . Gastroparesis 12/21/2013  . Intractable nausea and vomiting 12/21/2013  . Essential hypertension 05/01/2013  . Hyperlipidemia due to type 2 diabetes mellitus (HCC) 05/01/2013  . Generalized weakness 03/20/2013  . Back pain 03/20/2013  . Pulmonary embolism (HCC) 03/27/2011  . PERFORATION OF GALLBLADDER 08/30/2010  . Ulcer of lower limb, unspecified 03/01/2010  . METHICILLIN SUSCEPTIBLE STAPH AUREUS SEPTICEMIA 01/25/2010  . Acute osteomyelitis, ankle and foot 01/25/2010  . NAUSEA AND VOMITING 01/25/2010  . GERD 11/09/2009  . CAD S/P DES PCI to mLAD: Xience DES 2.75 x 15 (3.0 mm) 05/01/2009  . DIABETES, TYPE 1 05/14/2008  . OBSTRUCTIVE SLEEP APNEA 04/30/2008  . ALLERGIC RHINITIS, SEASONAL 04/30/2008  Past Surgical History:  Procedure Laterality Date  . AMPUTATION Left 03/08/2015   Procedure:  LEFT AMPUTATION BELOW KNEE;  Surgeon: Toni Arthurs, MD;  Location: [facility] ORS;  Service: Orthopedics;  Laterality: Left;  . APPLICATION OF WOUND VAC Left 01/07/2014  . CHOLECYSTECTOMY  06/2010  . CORONARY ANGIOPLASTY WITH STENT PLACEMENT  05/2009   "1"  . FOOT SURGERY Left 2010   "for Charcot's joint"  . HARDWARE REMOVAL Left 01/07/2014   Procedure: LEFT LEG REMOVAL OF DEEP IMPLANT AND SEQUESTRECTOMY;  APPLICATION OF WOUND VAC ;  Surgeon: Toni Arthurs, MD;  Location: [facility] OR;  Service: Orthopedics;  Laterality: Left;  . I&D EXTREMITY Left "multiple"   leg  . 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 IM NAILING TIBIA Left ~ 2012  . LEFT HEART CATH AND CORONARY ANGIOGRAPHY N/A 04/08/2017   Procedure: LEFT HEART CATH AND CORONARY ANGIOGRAPHY;  Surgeon: Runell Gess, MD;  Location: [facility] INVASIVE CV LAB;  Service: Cardiovascular;  Laterality: N/A;  . SHOULDER ARTHROSCOPY W/ ROTATOR CUFF REPAIR Left    "and bone spurs"  . TIBIAL IM ROD REMOVAL Left 01/07/2014  . TOE AMPUTATION Right ~ 2011   "great toe"  . VENA CAVA FILTER PLACEMENT  2012  . VENA CAVA FILTER PLACEMENT N/A 11/20/2016   Procedure: INSERTION VENA-CAVA FILTER;  Surgeon: Sherren Kerns, MD;  Location: [facility] OR;  Service: Vascular;  Laterality: N/A;  . WOUND DEBRIDEMENT Left 01/07/2014   "tibia"        Home Medications    Prior to Admission medications   Medication Sig Start Date End Date Taking? Authorizing Provider  amLODipine (NORVASC) 10 MG tablet Take 1 tablet (10 mg total) by mouth daily. 11/22/16  Yes Richarda Overlie, MD  apixaban (ELIQUIS) 5 MG TABS tablet Take 1 tablet (5 mg total) by mouth 2 (two) times daily. 09/24/17  Yes Osvaldo Shipper, MD  aspirin 81 MG EC tablet Take 1 tablet (81 mg total) by mouth daily. 04/12/17  Yes Glade Lloyd, MD  atorvastatin (LIPITOR) 80 MG tablet Take 1 tablet (80 mg total) by mouth daily at 6 PM. 04/11/17  Yes Hanley Ben, Kshitiz, MD  clopidogrel (PLAVIX) 75 MG tablet Take 1 tablet (75 mg total) by mouth daily. 04/12/17  Yes Glade Lloyd, MD  ferrous sulfate 325 (65 FE) MG tablet Take 325 mg by mouth every evening.    Yes [provider]  furosemide (LASIX) 20 MG tablet Take 1 tablet (20 mg total) by mouth daily. 04/12/17  Yes Glade Lloyd, MD    gabapentin (NEURONTIN) 300 MG capsule Take 300 mg by mouth 4 (four) times daily.    Yes [provider]  ibuprofen (ADVIL,MOTRIN) 200 MG tablet Take 400 mg by mouth every 6 (six) hours as needed for moderate pain.   Yes [provider]  insulin lispro (HUMALOG) 100 UNIT/ML injection Inject 0-10 Units into the skin 3 (three) times daily with meals. Inject SQ per sliding scale 150-250 (5 units), 251-350(8 units), 351-450(10 units), >450=10 units   Yes [provider]  lansoprazole (PREVACID) 30 MG capsule Take 30 mg by mouth 2 (two) times daily. 03/19/17  Yes [provider]  metoCLOPramide (REGLAN) 5 MG tablet Take 1 tablet (5 mg total) by mouth 3 (three) times daily. 01/02/18 02/05/18 Yes Noralee Stain, DO  metoprolol tartrate (LOPRESSOR) 25 MG tablet Take 1  tablet (25 mg total) by mouth 2 (two) times daily. 04/11/17  Yes Glade LloydAlekh, Kshitiz, MD  promethazine (PHENERGAN) 25 MG tablet Take 25-50 mg by mouth every 4 (four) hours as needed for nausea or vomiting.    Yes [provider]  ranitidine (ZANTAC) 150 MG tablet Take 150 mg by mouth 2 (two) times daily.    Yes [provider]  ranolazine (RANEXA) 500 MG 12 hr tablet Take 1 tablet (500 mg total) by mouth 2 (two) times daily. 04/11/17  Yes Glade LloydAlekh, Kshitiz, MD  traMADol (ULTRAM) 50 MG tablet Take 100 mg by mouth every 6 (six) hours as needed for moderate pain.    Yes [provider]  insulin NPH-regular Human (NOVOLIN 70/30) (70-30) 100 UNIT/ML injection Inject 25 Units into the skin 2 (two) times daily with a meal. Patient not taking: Reported on 02/05/2018 02/04/18 03/06/18  Noralee Stainhoi, Jennifer, DO  nitroGLYCERIN (NITROSTAT) 0.4 MG SL tablet Place 1 tablet (0.4 mg total) under the tongue every 5 (five) minutes as needed for chest pain. 04/11/17   Glade LloydAlekh, Kshitiz, MD  [facility] VERIO test strip Use to test blood sugar twice daily 09/24/17   [provider]    Family History Family History   Problem Relation Age of Onset  . COPD Mother   . Heart disease Mother   . Lung cancer Mother   . Retinal detachment Father   . Alcoholism Brother   . Heart disease Brother   . COPD Brother   . Diabetes Brother   . Hypertension Brother   . Stroke Brother     Social History Social History   Tobacco Use  . Smoking status: Never Smoker  . Smokeless tobacco: Never Used  Substance Use Topics  . Alcohol use: No    Frequency: Never    Comment: 01/07/2014 'might have a wine cooler a couple times/yr"  . Drug use: No     Allergies   Gabapentin; Nabumetone; Reglan [metoclopramide]; and Codeine   Review of Systems Review of Systems  Gastrointestinal: Positive for nausea.  All other systems reviewed and are negative.    Physical Exam Updated Vital Signs BP (!) 148/83 (BP Location: Left Arm)   Pulse 93   Temp 98.5 F (36.9 C) (Oral)   Resp 18   Ht 6\' 3"  (1.905 m)   Wt 122.1 kg   SpO2 96%   BMI 33.65 kg/m   Physical Exam  Constitutional: He is oriented to person, place, and time. He appears well-developed and well-nourished. No distress.  Chronically ill-appearing male in no acute distress  HENT:  Head: Normocephalic and atraumatic.  Eyes: Pupils are equal, round, and reactive to light. Conjunctivae and EOM are normal.  Neck: Normal range of motion. Neck supple.  Cardiovascular: Normal rate, regular rhythm and intact distal pulses.  Pulmonary/Chest: Effort normal and breath sounds normal. No respiratory distress. He has no wheezes.  Abdominal: Soft. He exhibits no distension and no mass. There is no tenderness. There is no rebound and no guarding.  Obese.  No tenderness palpation of the abdomen.  Soft without rigidity, guarding or distention.  No masses.  Negative rebound.  No signs of peritonitis.  Musculoskeletal: Normal range of motion.  Neurological: He is alert and oriented to person, place, and time.  Skin: Skin is warm and dry. Capillary refill takes less than  2 seconds.  Psychiatric: He has a normal mood and affect.  Nursing note and vitals reviewed.    ED Treatments / Results  Labs (all labs ordered are listed, but only abnormal results are displayed) Labs Reviewed  CBC WITH DIFFERENTIAL/PLATELET - Abnormal; Notable for the following components:      Result Value   Lymphs Abs 0.5 (*)    All other components within normal limits  COMPREHENSIVE METABOLIC PANEL - Abnormal; Notable for the following components:   Glucose, Bld 233 (*)    Albumin 3.4 (*)    All other components within normal limits  URINALYSIS, ROUTINE W REFLEX MICROSCOPIC - Abnormal; Notable for the following components:   Glucose, UA >=500 (*)    Hgb urine dipstick SMALL (*)    Ketones, ur 80 (*)    Protein, ur 30 (*)    Bacteria, UA RARE (*)    All other components within normal limits  CBG MONITORING, ED - Abnormal; Notable for the following components:   Glucose-Capillary 217 (*)    All other components within normal limits  LIPASE, BLOOD    EKG None  Radiology Portable Chest X-ray (1 View)  Result Date: 02/03/2018 CLINICAL DATA:  Diabetic ketoacidosis EXAM: PORTABLE CHEST 1 VIEW COMPARISON:  01/18/2018 FINDINGS: Slightly low lung volumes with minimal atelectasis at the left lung base. No acute pulmonary consolidation, CHF, effusion or pneumothorax. Heart size and mediastinal contours are within normal limits. The visualized skeletal structures are unremarkable. IMPRESSION: No active disease. Electronically Signed   By: Tollie Eth M.D.   On: 02/03/2018 19:10    Procedures Procedures (including critical care time)  Medications Ordered in ED Medications  prochlorperazine (COMPAZINE) injection 10 mg (10 mg Intravenous Given 02/05/18 1242)  promethazine (PHENERGAN) injection 12.5 mg (12.5 mg Intravenous Given 02/05/18 1243)     Initial Impression / Assessment and Plan / ED Course  I have reviewed the triage vital signs and the nursing notes.  Pertinent labs  & imaging results that were available during my care of the patient were reviewed by me and considered in my medical decision making (see chart for details).     Patient presenting for evaluation of nausea and retching without vomiting.  Physical exam reassuring, he is afebrile not tachycardic.  Appears nontoxic.  Abdominal exam reassuring, no tenderness.  Doubt surgical abdomen.  Patient recently admitted for DKA, will obtain labs and urine.  Will start IV fluids, give IV Reglan and Phenergan for presumed gastroparesis.  On reassessment, patient reports symptoms are improved.  No further retching.  Will give water for p.o. challenge.  Abdominal exam remains reassuring.  Labs without signs of DKA, no abnormality of anion gap or bicarb.  Blood sugars improved at 217.  Urine without uti. Ketones in urine, consistent with previous findings.  No leukocytosis or other concerning findings on lab work.  Case discussed with attending, Dr. Juleen China evaluated patient.    Patient tolerating water without difficulty.  Requesting to be discharged.  Discussed with patient importance of follow-up with PCP for further management of gastroparesis and his chronic additions.  At this time, patient appears a for discharge.  Return precautions given.  Patient states he understands agrees plan.  Final Clinical Impressions(s) / ED Diagnoses   Final diagnoses:  Nausea  Gastroparesis    ED Discharge Orders    None       Alveria Apley, PA-C 02/05/18 1640    Raeford Razor, MD 02/06/18 1143

## 2018-02-05 NOTE — ED Triage Notes (Signed)
He c/o constant nausea and frequent retching (no vomiting) today. He tells me he has chronic gastroparesis. He is s/p left b.k.a.

## 2018-02-05 NOTE — Care Management Note (Signed)
Case Management Note  CM noted pt's returned to the ED from hospital transition home yesterday.  CM contacted [facility] and [facility] to advise of pt's return.  They will follow for needs.

## 2018-02-05 NOTE — Discharge Instructions (Addendum)
Continue using your antinausea medicines as prescribed. Eat very small, basic meals over the next several days until symptoms improved. Follow-up with your primary care doctor as needed for further evaluation.   Return to the emergency room with any new, worsening, or concerning symptoms.

## 2018-02-05 NOTE — ED Notes (Signed)
Pt given cup of ice water 

## 2018-02-05 NOTE — ED Notes (Signed)
Pt resting at this time. Pt family member made aware of need for urine and given urinal for pt

## 2018-02-05 NOTE — ED Notes (Signed)
Bed: WA01 Expected date:  Expected time:  Means of arrival:  Comments: EMS-weak 

## 2018-02-05 NOTE — ED Notes (Signed)
CBG 217  

## 2018-02-07 DIAGNOSIS — Z7901 Long term (current) use of anticoagulants: Secondary | ICD-10-CM | POA: Diagnosis not present

## 2018-02-07 DIAGNOSIS — G4733 Obstructive sleep apnea (adult) (pediatric): Secondary | ICD-10-CM | POA: Diagnosis not present

## 2018-02-07 DIAGNOSIS — Z7982 Long term (current) use of aspirin: Secondary | ICD-10-CM | POA: Diagnosis not present

## 2018-02-07 DIAGNOSIS — E1151 Type 2 diabetes mellitus with diabetic peripheral angiopathy without gangrene: Secondary | ICD-10-CM | POA: Diagnosis not present

## 2018-02-07 DIAGNOSIS — K219 Gastro-esophageal reflux disease without esophagitis: Secondary | ICD-10-CM | POA: Diagnosis not present

## 2018-02-07 DIAGNOSIS — Z794 Long term (current) use of insulin: Secondary | ICD-10-CM | POA: Diagnosis not present

## 2018-02-07 DIAGNOSIS — E1169 Type 2 diabetes mellitus with other specified complication: Secondary | ICD-10-CM | POA: Diagnosis not present

## 2018-02-07 DIAGNOSIS — E669 Obesity, unspecified: Secondary | ICD-10-CM | POA: Diagnosis not present

## 2018-02-07 DIAGNOSIS — Z95828 Presence of other vascular implants and grafts: Secondary | ICD-10-CM | POA: Diagnosis not present

## 2018-02-07 DIAGNOSIS — Z6833 Body mass index (BMI) 33.0-33.9, adult: Secondary | ICD-10-CM | POA: Diagnosis not present

## 2018-02-07 DIAGNOSIS — J449 Chronic obstructive pulmonary disease, unspecified: Secondary | ICD-10-CM | POA: Diagnosis not present

## 2018-02-07 DIAGNOSIS — M1990 Unspecified osteoarthritis, unspecified site: Secondary | ICD-10-CM | POA: Diagnosis not present

## 2018-02-07 DIAGNOSIS — F329 Major depressive disorder, single episode, unspecified: Secondary | ICD-10-CM | POA: Diagnosis not present

## 2018-02-07 DIAGNOSIS — E1165 Type 2 diabetes mellitus with hyperglycemia: Secondary | ICD-10-CM | POA: Diagnosis not present

## 2018-02-07 DIAGNOSIS — Z89512 Acquired absence of left leg below knee: Secondary | ICD-10-CM | POA: Diagnosis not present

## 2018-02-07 DIAGNOSIS — I2510 Atherosclerotic heart disease of native coronary artery without angina pectoris: Secondary | ICD-10-CM | POA: Diagnosis not present

## 2018-02-07 DIAGNOSIS — Z5181 Encounter for therapeutic drug level monitoring: Secondary | ICD-10-CM | POA: Diagnosis not present

## 2018-02-07 DIAGNOSIS — I10 Essential (primary) hypertension: Secondary | ICD-10-CM | POA: Diagnosis not present

## 2018-02-07 DIAGNOSIS — D649 Anemia, unspecified: Secondary | ICD-10-CM | POA: Diagnosis not present

## 2018-02-07 DIAGNOSIS — Z955 Presence of coronary angioplasty implant and graft: Secondary | ICD-10-CM | POA: Diagnosis not present

## 2018-02-07 DIAGNOSIS — I252 Old myocardial infarction: Secondary | ICD-10-CM | POA: Diagnosis not present

## 2018-02-07 DIAGNOSIS — E785 Hyperlipidemia, unspecified: Secondary | ICD-10-CM | POA: Diagnosis not present

## 2018-02-07 DIAGNOSIS — Z7902 Long term (current) use of antithrombotics/antiplatelets: Secondary | ICD-10-CM | POA: Diagnosis not present

## 2018-02-07 DIAGNOSIS — E1143 Type 2 diabetes mellitus with diabetic autonomic (poly)neuropathy: Secondary | ICD-10-CM | POA: Diagnosis not present

## 2018-02-07 DIAGNOSIS — F419 Anxiety disorder, unspecified: Secondary | ICD-10-CM | POA: Diagnosis not present

## 2018-02-08 LAB — CULTURE, BLOOD (ROUTINE X 2)
Culture: NO GROWTH
Culture: NO GROWTH
Special Requests: ADEQUATE
Special Requests: ADEQUATE

## 2018-02-10 ENCOUNTER — Ambulatory Visit: Payer: Self-pay | Admitting: Pharmacist

## 2018-02-18 DIAGNOSIS — S88112A Complete traumatic amputation at level between knee and ankle, left lower leg, initial encounter: Secondary | ICD-10-CM | POA: Diagnosis not present

## 2018-02-18 DIAGNOSIS — I2510 Atherosclerotic heart disease of native coronary artery without angina pectoris: Secondary | ICD-10-CM | POA: Diagnosis not present

## 2018-02-18 DIAGNOSIS — Z794 Long term (current) use of insulin: Secondary | ICD-10-CM | POA: Diagnosis not present

## 2018-02-18 DIAGNOSIS — I10 Essential (primary) hypertension: Secondary | ICD-10-CM | POA: Diagnosis not present

## 2018-02-18 DIAGNOSIS — E1165 Type 2 diabetes mellitus with hyperglycemia: Secondary | ICD-10-CM | POA: Diagnosis not present

## 2018-02-20 ENCOUNTER — Other Ambulatory Visit: Payer: Self-pay | Admitting: Pharmacist

## 2018-02-20 ENCOUNTER — Ambulatory Visit: Payer: PPO | Admitting: Emergency Medicine

## 2018-02-20 NOTE — Patient Outreach (Signed)
[facility]) Care Management  02/20/2018  Samuel [name] 17-Jul-1950 [id number]   Patient was called to follow up on medication assistance.  He and his caregiver, [name], were on speaker phone. HIPAA identifiers were obtained.  When asked about the patient assistance forms, patient said what we sent him was stamped "returned to sender" and was sent back to them.  In addition, patient said he had to reschedule his endocrinology appointment due to him being nauseated.  (Patient was encouraged to go to his provider appointments versus staying home and then subsequently going to the ED).  [name] said they were in the process of rescheduling the appointment.  Plan: Due to the intense issues getting the patient's forms, I will go to his home and pick them up.   [name], PharmD, BCACP [facility] Clinical Pharmacist [phone]

## 2018-02-21 ENCOUNTER — Other Ambulatory Visit: Payer: Self-pay | Admitting: Pharmacist

## 2018-02-24 NOTE — Patient Outreach (Signed)
[facility]) Care Management 02/21/2018  Samuel [name] [birth year] [id number]   Went to patient's home to pick up patient assistance forms. HIPAA identifiers were obtained.   Once at his home, it was revealed that his therapy was changed to [place].  The forms I picked up were for Humalog and Basaglar. The program for [name] requires that patients pay at least $1000 in out-of-pocket medication expenses to qualify for their program.  [name] has not spent that much yet.  Dr. Kelli Churn office was called on 02/21/18 and again on 02/24/18 and a message was left requesting a call back to clarify the patient's insulin dose and to inquire about getting the prescription changed from [place] to [employer] as [name] will accept a hardship letter.  Plan: Await call back from Dr. Kelli Churn office Follow up within 2 business days.   [name], PharmD, BCACP [facility] Clinical Pharmacist [phone]

## 2018-02-25 ENCOUNTER — Other Ambulatory Visit: Payer: Self-pay | Admitting: Pharmacist

## 2018-02-26 NOTE — Patient Outreach (Signed)
[facility]) Care Management  02/26/2018  Samuel [name] 11-Jun-1951 [id number]   Aram Beecham at Dr. Derald Macleod office called back. She stated that their office would be taking over the patient's medication assistance needs. She felt like they could get the patient products through [employer] without having to worry about his out-of-pocket spend and said Dr. Gayla Medicus did not want to change the patient to Basaglar.    Patient was called and a message was left on his personalized voicemail letting him know Dr. Derald Macleod office will be completing his medication assistance paperwork from now on.  Plan: Close the patient's case as his provider will now be handling patient assistance.  Patient is already working with palliative care.  A case closure note will be sent to the patient and their provider.  [name], PharmD, BCACP [facility] Clinical Pharmacist [phone]

## 2018-02-27 DIAGNOSIS — E1165 Type 2 diabetes mellitus with hyperglycemia: Secondary | ICD-10-CM | POA: Diagnosis not present

## 2018-03-06 DIAGNOSIS — I5032 Chronic diastolic (congestive) heart failure: Secondary | ICD-10-CM | POA: Diagnosis not present

## 2018-03-06 DIAGNOSIS — E7800 Pure hypercholesterolemia, unspecified: Secondary | ICD-10-CM | POA: Diagnosis not present

## 2018-03-06 DIAGNOSIS — E118 Type 2 diabetes mellitus with unspecified complications: Secondary | ICD-10-CM | POA: Diagnosis not present

## 2018-03-06 DIAGNOSIS — Z86711 Personal history of pulmonary embolism: Secondary | ICD-10-CM | POA: Diagnosis not present

## 2018-03-06 DIAGNOSIS — Z23 Encounter for immunization: Secondary | ICD-10-CM | POA: Diagnosis not present

## 2018-03-06 DIAGNOSIS — Z89512 Acquired absence of left leg below knee: Secondary | ICD-10-CM | POA: Diagnosis not present

## 2018-03-06 DIAGNOSIS — Z0000 Encounter for general adult medical examination without abnormal findings: Secondary | ICD-10-CM | POA: Diagnosis not present

## 2018-03-06 DIAGNOSIS — I10 Essential (primary) hypertension: Secondary | ICD-10-CM | POA: Diagnosis not present

## 2018-03-06 DIAGNOSIS — I2510 Atherosclerotic heart disease of native coronary artery without angina pectoris: Secondary | ICD-10-CM | POA: Diagnosis not present

## 2018-03-06 DIAGNOSIS — K3184 Gastroparesis: Secondary | ICD-10-CM | POA: Diagnosis not present

## 2018-03-08 ENCOUNTER — Emergency Department (HOSPITAL_COMMUNITY)
Admission: EM | Admit: 2018-03-08 | Discharge: 2018-03-08 | Disposition: A | Discharge status: Home / Self Care | Payer: PPO | Attending: Emergency Medicine | Admitting: Emergency Medicine

## 2018-03-08 ENCOUNTER — Encounter (HOSPITAL_COMMUNITY): Payer: Self-pay

## 2018-03-08 ENCOUNTER — Other Ambulatory Visit: Payer: Self-pay

## 2018-03-08 DIAGNOSIS — Z7982 Long term (current) use of aspirin: Secondary | ICD-10-CM | POA: Diagnosis not present

## 2018-03-08 DIAGNOSIS — K3184 Gastroparesis: Secondary | ICD-10-CM | POA: Diagnosis not present

## 2018-03-08 DIAGNOSIS — Z7902 Long term (current) use of antithrombotics/antiplatelets: Secondary | ICD-10-CM | POA: Diagnosis not present

## 2018-03-08 DIAGNOSIS — I10 Essential (primary) hypertension: Secondary | ICD-10-CM | POA: Diagnosis not present

## 2018-03-08 DIAGNOSIS — Z794 Long term (current) use of insulin: Secondary | ICD-10-CM | POA: Diagnosis not present

## 2018-03-08 DIAGNOSIS — R110 Nausea: Secondary | ICD-10-CM | POA: Diagnosis not present

## 2018-03-08 DIAGNOSIS — R112 Nausea with vomiting, unspecified: Secondary | ICD-10-CM | POA: Diagnosis present

## 2018-03-08 DIAGNOSIS — I2510 Atherosclerotic heart disease of native coronary artery without angina pectoris: Secondary | ICD-10-CM | POA: Insufficient documentation

## 2018-03-08 DIAGNOSIS — Z79899 Other long term (current) drug therapy: Secondary | ICD-10-CM | POA: Diagnosis not present

## 2018-03-08 DIAGNOSIS — E1110 Type 2 diabetes mellitus with ketoacidosis without coma: Secondary | ICD-10-CM | POA: Insufficient documentation

## 2018-03-08 DIAGNOSIS — G43A0 Cyclical vomiting, not intractable: Secondary | ICD-10-CM | POA: Insufficient documentation

## 2018-03-08 DIAGNOSIS — R531 Weakness: Secondary | ICD-10-CM | POA: Diagnosis not present

## 2018-03-08 DIAGNOSIS — J449 Chronic obstructive pulmonary disease, unspecified: Secondary | ICD-10-CM | POA: Diagnosis not present

## 2018-03-08 DIAGNOSIS — R1115 Cyclical vomiting syndrome unrelated to migraine: Secondary | ICD-10-CM

## 2018-03-08 DIAGNOSIS — R1111 Vomiting without nausea: Secondary | ICD-10-CM | POA: Diagnosis not present

## 2018-03-08 DIAGNOSIS — R197 Diarrhea, unspecified: Secondary | ICD-10-CM | POA: Diagnosis not present

## 2018-03-08 LAB — BLOOD GAS, VENOUS
Acid-Base Excess: 4.6 mmol/L — ABNORMAL HIGH (ref 0.0–2.0)
Bicarbonate: 26.6 mmol/L (ref 20.0–28.0)
O2 Saturation: 73.6 %
Patient temperature: 98.6
pCO2, Ven: 31.8 mmHg — ABNORMAL LOW (ref 44.0–60.0)
pH, Ven: 7.532 — ABNORMAL HIGH (ref 7.250–7.430)
pO2, Ven: 35.4 mmHg (ref 32.0–45.0)

## 2018-03-08 LAB — COMPREHENSIVE METABOLIC PANEL
ALT: 14 U/L (ref 0–44)
AST: 17 U/L (ref 15–41)
Albumin: 3.4 g/dL — ABNORMAL LOW (ref 3.5–5.0)
Alkaline Phosphatase: 67 U/L (ref 38–126)
Anion gap: 12 (ref 5–15)
BUN: 14 mg/dL (ref 8–23)
CO2: 26 mmol/L (ref 22–32)
Calcium: 9.1 mg/dL (ref 8.9–10.3)
Chloride: 101 mmol/L (ref 98–111)
Creatinine, Ser: 0.88 mg/dL (ref 0.61–1.24)
GFR calc Af Amer: >60 mL/min (ref >60)
GFR calc non Af Amer: >60 mL/min (ref >60)
Glucose, Bld: 204 mg/dL — ABNORMAL HIGH (ref 70–99)
Potassium: 4 mmol/L (ref 3.5–5.1)
Sodium: 139 mmol/L (ref 135–145)
Total Bilirubin: 1 mg/dL (ref 0.3–1.2)
Total Protein: 7 g/dL (ref 6.5–8.1)

## 2018-03-08 LAB — CBC WITH DIFFERENTIAL/PLATELET
Basophils Absolute: 0 10*3/uL (ref 0.0–0.1)
Basophils Relative: 0 %
Eosinophils Absolute: 0.1 10*3/uL (ref 0.0–0.7)
Eosinophils Relative: 2 %
HCT: 40.1 % (ref 39.0–52.0)
Hemoglobin: 13.7 g/dL (ref 13.0–17.0)
Lymphocytes Relative: 17 %
Lymphs Abs: 1.2 10*3/uL (ref 0.7–4.0)
MCH: 30.4 pg (ref 26.0–34.0)
MCHC: 34.2 g/dL (ref 30.0–36.0)
MCV: 89.1 fL (ref 78.0–100.0)
Monocytes Absolute: 0.5 10*3/uL (ref 0.1–1.0)
Monocytes Relative: 6 %
Neutro Abs: 5.4 10*3/uL (ref 1.7–7.7)
Neutrophils Relative %: 75 %
Platelets: 261 10*3/uL (ref 150–400)
RBC: 4.5 MIL/uL (ref 4.22–5.81)
RDW: 12.7 % (ref 11.5–15.5)
WBC: 7.2 10*3/uL (ref 4.0–10.5)

## 2018-03-08 LAB — URINALYSIS, ROUTINE W REFLEX MICROSCOPIC
Bacteria, UA: NONE SEEN
Bilirubin Urine: NEGATIVE
Glucose, UA: >=500 mg/dL — AB
Hgb urine dipstick: NEGATIVE
Ketones, ur: 20 mg/dL — AB
Leukocytes, UA: NEGATIVE
Nitrite: NEGATIVE
Protein, ur: 30 mg/dL — AB
Specific Gravity, Urine: 1.013 (ref 1.005–1.030)
pH: 8 (ref 5.0–8.0)

## 2018-03-08 LAB — LIPASE, BLOOD: Lipase: 18 U/L (ref 11–51)

## 2018-03-08 LAB — POC OCCULT BLOOD, ED: Fecal Occult Bld: NEGATIVE

## 2018-03-08 MED ORDER — SODIUM CHLORIDE 0.9 % IV BOLUS
1000.0000 mL | Freq: Once | INTRAVENOUS | Status: AC
  Administered 2018-03-08: 1000 mL via INTRAVENOUS

## 2018-03-08 MED ORDER — PROMETHAZINE HCL 25 MG/ML IJ SOLN
12.5000 mg | Freq: Once | INTRAMUSCULAR | Status: AC
  Administered 2018-03-08: 12.5 mg via INTRAVENOUS
  Filled 2018-03-08: qty 1

## 2018-03-08 NOTE — ED Notes (Signed)
Bed: [id number] Expected date: 03/08/18 Expected time: 10:00 AM Means of arrival: Ambulance Comments: Nausea and Vomiting

## 2018-03-08 NOTE — ED Triage Notes (Signed)
Per EMS:  Pt from home with c/o of nausea and vomiting for 1.5 days.  Pt c/o of weakness and chills.  Pt c/o of not being able to eat since lunch yesterday.  Pt hx of diabetes.

## 2018-03-08 NOTE — Discharge Instructions (Addendum)
Please follow-up with your doctor this week for further management of your gastroparesis and recheck after today's visit.  Please return the emergency department if you develop any new or worsening symptoms.

## 2018-03-08 NOTE — ED Provider Notes (Signed)
[facility]-EMERGENCY DEPT Provider Note   CSN: [id number] Arrival date & time: 03/08/18  1000     History   Chief Complaint Chief Complaint  Patient presents with  . Nausea  . Emesis    HPI Samuel [name] is a 67 y.o. male with history of diabetes, gastroparesis who presents with nausea and vomiting for the past 1-1/2 days.  Patient reports he has been vomiting or dry heaving 6 times daily.  He has had 3 bowel movements today.  He denies any abdominal pain.  He reports this feels typical of his normal nausea and vomiting from gastroparesis.  He reports he recently changed insulin 2 weeks ago and his sugars have been much higher, however he reports his sugar was in the 150s this morning.  He denies any fevers, but has had chills and fatigue at home.  He denies any chest pain, shortness of breath, back pain, or urinary symptoms.  HPI  Past Medical History:  Diagnosis Date  . Anxiety   . Arthritis    "all over"   . CAD (coronary artery disease)   . Charcot's joint    "left foot"  . 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 Depression   . Gastroparesis   . GERD (gastroesophageal reflux disease)   . H/O hiatal hernia   . Hyperlipidemia   . Hypertension   . Myocardial infarction (HCC) 2017/03/27   around this date  . OSA (obstructive sleep apnea)    "not bad enough for a mask"  . Peripheral neuropathy   . Peripheral vascular disease (HCC)   . PONV (postoperative nausea and vomiting)   . Pulmonary embolism (HCC)    hx. of 2012  . Shortness of breath    exertion  . Type II diabetes mellitus [facility])     Patient Active Problem List   Diagnosis Date Noted  . DKA, type 2 (HCC) 02/03/2018  . DOE (dyspnea on exertion) 01/24/2018  . Uncontrolled diabetes mellitus (HCC) 09/23/2017  . DKA, type 2, not at goal [facility]) 09/22/2017  . S/P BKA (below knee amputation) (HCC) 04/19/2017  . COPD (chronic obstructive pulmonary disease) (HCC) 04/19/2017   . NSTEMI (non-ST elevated myocardial infarction) (HCC) 04/07/2017  . Osteomyelitis of foot (HCC)   . Protein-calorie malnutrition, severe (HCC) 03/08/2015  . Osteomyelitis of foot, acute (HCC) 03/07/2015  . Diabetes mellitus type 2, uncontrolled (HCC) 03/07/2015  . Normocytic normochromic anemia 03/07/2015  . Obesity (BMI 30-39.9) 03/07/2015  . Nausea & vomiting   . DM (diabetes mellitus), type 2 (HCC) 01/07/2014  . Chronic osteomyelitis involving lower leg (HCC) 01/07/2014  . Vomiting 12/22/2013  . Gastroparesis 12/21/2013  . Intractable nausea and vomiting 12/21/2013  . Essential hypertension 05/01/2013  . Hyperlipidemia due to type 2 diabetes mellitus (HCC) 05/01/2013  . Generalized weakness 03/20/2013  . Back pain 03/20/2013  . Pulmonary embolism (HCC) 03/27/2011  . PERFORATION OF GALLBLADDER 08/30/2010  . Ulcer of lower limb, unspecified 03/01/2010  . METHICILLIN SUSCEPTIBLE STAPH AUREUS SEPTICEMIA 01/25/2010  . Acute osteomyelitis, ankle and foot 01/25/2010  . NAUSEA AND VOMITING 01/25/2010  . GERD 11/09/2009  . CAD S/P DES PCI to mLAD: Xience DES 2.75 x 15 (3.0 mm) 05/01/2009  . DIABETES, TYPE 1 05/14/2008  . OBSTRUCTIVE SLEEP APNEA 04/30/2008  . ALLERGIC RHINITIS, SEASONAL 04/30/2008    Past Surgical History:  Procedure Laterality Date  . AMPUTATION Left 03/08/2015   Procedure:  LEFT AMPUTATION BELOW KNEE;  Surgeon: Jonny Ruiz  Victorino DikeHewitt, MD;  Location: [facility] ORS;  Service: Orthopedics;  Laterality: Left;  . APPLICATION OF WOUND VAC Left 01/07/2014  . CHOLECYSTECTOMY  06/2010  . CORONARY ANGIOPLASTY WITH STENT PLACEMENT  05/2009   "1"  . FOOT SURGERY Left 2010   "for Charcot's joint"  . HARDWARE REMOVAL Left 01/07/2014   Procedure: LEFT LEG REMOVAL OF DEEP IMPLANT AND SEQUESTRECTOMY; APPLICATION OF WOUND VAC ;  Surgeon: Toni ArthursJohn Hewitt, MD;  Location: [facility] OR;  Service: Orthopedics;  Laterality: Left;  . I&D EXTREMITY Left "multiple"   leg  . I&D EXTREMITY Left 01/07/2014   Procedure:  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;  Surgeon: Toni ArthursJohn Hewitt, MD;  Location: [facility] OR;  Service: Orthopedics;  Laterality: Left;  . IM NAILING TIBIA Left ~ 2012  . LEFT HEART CATH AND CORONARY ANGIOGRAPHY N/A 04/08/2017   Procedure: LEFT HEART CATH AND CORONARY ANGIOGRAPHY;  Surgeon: Runell GessBerry, Jonathan J, MD;  Location: [facility] INVASIVE CV LAB;  Service: Cardiovascular;  Laterality: N/A;  . SHOULDER ARTHROSCOPY W/ ROTATOR CUFF REPAIR Left    "and bone spurs"  . TIBIAL IM ROD REMOVAL Left 01/07/2014  . TOE AMPUTATION Right ~ 2011   "great toe"  . VENA CAVA FILTER PLACEMENT  2012  . VENA CAVA FILTER PLACEMENT N/A 11/20/2016   Procedure: INSERTION VENA-CAVA FILTER;  Surgeon: Sherren KernsFields, Charles E, MD;  [facility];  Service: Vascular;  Laterality: N/A;  . WOUND DEBRIDEMENT Left 01/07/2014   "tibia"        Home Medications    Prior to Admission medications   Medication Sig Start Date End Date Taking? Authorizing Provider  amLODipine (NORVASC) 10 MG tablet Take 1 tablet (10 mg total) by mouth daily. 11/22/16  Yes Richarda OverlieAbrol, Nayana, MD  apixaban (ELIQUIS) 5 MG TABS tablet Take 1 tablet (5 mg total) by mouth 2 (two) times daily. 09/24/17  Yes Osvaldo ShipperKrishnan, Gokul, MD  aspirin 81 MG EC tablet Take 1 tablet (81 mg total) by mouth daily. 04/12/17  Yes Glade LloydAlekh, Kshitiz, MD  atorvastatin (LIPITOR) 80 MG tablet Take 1 tablet (80 mg total) by mouth daily at 6 PM. 04/11/17  Yes Hanley BenAlekh, Kshitiz, MD  clopidogrel (PLAVIX) 75 MG tablet Take 1 tablet (75 mg total) by mouth daily. 04/12/17  Yes Glade LloydAlekh, Kshitiz, MD  ferrous sulfate 325 (65 FE) MG tablet Take 325 mg by mouth every evening.    Yes [provider]  furosemide (LASIX) 20 MG tablet Take 1 tablet (20 mg total) by mouth daily. 04/12/17  Yes Glade LloydAlekh, Kshitiz, MD  gabapentin (NEURONTIN) 300 MG capsule Take 300 mg by mouth 4 (four) times daily.    Yes [provider]  ibuprofen (ADVIL,MOTRIN) 200 MG tablet Take 400 mg by mouth every 6 (six) hours as  needed for moderate pain.   Yes [provider]  insulin degludec (TRESIBA FLEXTOUCH) 100 UNIT/ML SOPN FlexTouch Pen Inject 40 Units into the skin daily.   Yes Averneni, Carmon SailsMadhavi, MD  insulin lispro (HUMALOG) 100 UNIT/ML injection Inject 12 units before each meal plus sliding scale   Yes Averneni, Madhavi, MD  lansoprazole (PREVACID) 30 MG capsule Take 30 mg by mouth 2 (two) times daily. 03/19/17  Yes [provider]  metFORMIN (GLUCOPHAGE-XR) 500 MG 24 hr tablet Take 500 mg by mouth 2 (two) times daily.  02/18/18  Yes [provider]  metoCLOPramide (REGLAN) 5 MG tablet Take 1 tablet (5 mg total) by mouth 3 (three) times daily. 01/02/18 03/08/18 Yes Noralee Stainhoi, Jennifer, DO  metoprolol tartrate (  LOPRESSOR) 25 MG tablet Take 1 tablet (25 mg total) by mouth 2 (two) times daily. 04/11/17  Yes Glade Lloyd, MD  nitroGLYCERIN (NITROSTAT) 0.4 MG SL tablet Place 1 tablet (0.4 mg total) under the tongue every 5 (five) minutes as needed for chest pain. 04/11/17  Yes Glade Lloyd, MD  promethazine (PHENERGAN) 25 MG tablet Take 25-50 mg by mouth every 4 (four) hours as needed for nausea or vomiting.    Yes [provider]  ranitidine (ZANTAC) 150 MG tablet Take 150 mg by mouth 2 (two) times daily.    Yes [provider]  ranolazine (RANEXA) 500 MG 12 hr tablet Take 1 tablet (500 mg total) by mouth 2 (two) times daily. 04/11/17  Yes Glade Lloyd, MD  traMADol (ULTRAM) 50 MG tablet Take 100 mg by mouth every 6 (six) hours as needed for moderate pain.    Yes [provider]  Lum Babe test strip Use to test blood sugar twice daily 09/24/17   [provider]    Family History Family History  Problem Relation Age of Onset  . COPD Mother   . Heart disease Mother   . Lung cancer Mother   . Retinal detachment Father   . Alcoholism Brother   . Heart disease Brother   . COPD Brother   . Diabetes Brother   . Hypertension Brother   . Stroke Brother      Social History Social History   Tobacco Use  . Smoking status: Never Smoker  . Smokeless tobacco: Never Used  Substance Use Topics  . Alcohol use: No    Frequency: Never    Comment: 01/07/2014 'might have a wine cooler a couple times/yr"  . Drug use: No     Allergies   Gabapentin; Nabumetone; Reglan [metoclopramide]; and Codeine   Review of Systems Review of Systems  Constitutional: Negative for chills and fever.  HENT: Negative for facial swelling and sore throat.   Respiratory: Negative for shortness of breath.   Cardiovascular: Negative for chest pain.  Gastrointestinal: Positive for nausea and vomiting. Negative for abdominal pain.  Genitourinary: Negative for dysuria.  Musculoskeletal: Negative for back pain.  Skin: Negative for rash and wound.  Neurological: Negative for headaches.  Psychiatric/Behavioral: The patient is not nervous/anxious.      Physical Exam Updated Vital Signs BP (!) 155/83 (BP Location: Left Arm)   Pulse 86   Temp 98 F (36.7 C) (Oral)   Resp 18   Ht 6\' 3"  (1.905 m)   Wt 120.2 kg   SpO2 91%   BMI 33.12 kg/m   Physical Exam  Constitutional: He appears well-developed and well-nourished. No distress.  HENT:  Head: Normocephalic and atraumatic.  Mouth/Throat: Mucous membranes are dry. No oropharyngeal exudate.  Eyes: Pupils are equal, round, and reactive to light. Conjunctivae are normal. Right eye exhibits no discharge. Left eye exhibits no discharge. No scleral icterus.  Neck: Normal range of motion. Neck supple. No thyromegaly present.  Cardiovascular: Normal rate, regular rhythm, normal heart sounds and intact distal pulses. Exam reveals no gallop and no friction rub.  No murmur heard. Pulmonary/Chest: Effort normal and breath sounds normal. No stridor. No respiratory distress. He has no wheezes. He has no rales.  Abdominal: Soft. Bowel sounds are normal. He exhibits no distension. There is no tenderness. There is no rebound and  no guarding.  Musculoskeletal: He exhibits no edema.  L BKA  Lymphadenopathy:    He has no cervical adenopathy.  Neurological: He  is alert. Coordination normal.  Skin: Skin is warm and dry. No rash noted. He is not diaphoretic. No pallor.  Psychiatric: He has a normal mood and affect.  Nursing note and vitals reviewed.    ED Treatments / Results  Labs (all labs ordered are listed, but only abnormal results are displayed) Labs Reviewed  COMPREHENSIVE METABOLIC PANEL - Abnormal; Notable for the following components:      Result Value   Glucose, Bld 204 (*)    Albumin 3.4 (*)    All other components within normal limits  URINALYSIS, ROUTINE W REFLEX MICROSCOPIC - Abnormal; Notable for the following components:   Glucose, UA >=500 (*)    Ketones, ur 20 (*)    Protein, ur 30 (*)    All other components within normal limits  BLOOD GAS, VENOUS - Abnormal; Notable for the following components:   pH, Ven 7.532 (*)    pCO2, Ven 31.8 (*)    Acid-Base Excess 4.6 (*)    All other components within normal limits  LIPASE, BLOOD  CBC WITH DIFFERENTIAL/PLATELET  POC OCCULT BLOOD, ED  CBG MONITORING, ED    EKG None  Radiology No results found.  Procedures Procedures (including critical care time)  Medications Ordered in ED Medications  sodium chloride 0.9 % bolus 1,000 mL (0 mLs Intravenous Stopped 03/08/18 1314)  promethazine (PHENERGAN) injection 12.5 mg (12.5 mg Intravenous Given 03/08/18 1210)     Initial Impression / Assessment and Plan / ED Course  I have reviewed the triage vital signs and the nursing notes.  Pertinent labs & imaging results that were available during my care of the patient were reviewed by me and considered in my medical decision making (see chart for details).     Patient presenting with nausea and vomiting which was resolved in the ED with Phenergan and 1 L fluid bolus.  Patient reports he is feeling so much better and is ready to go home.  Labs are  unremarkable except for mildly elevated venous pH, 7.53 and glucose 204.  UA showed 20 ketones and 30 protein as well as >500 glucose.  Patient's abdomen is nontender.  Patient is tolerating oral fluids prior to discharge.  Follow-up to PCP this week for recheck.  Return precautions discussed.  Patient understands and agrees with plan.  Patient vitals stable throughout ED course and discharged in satisfactory condition. I discussed patient case with Dr. Estell Harpin who guided the patient's management and agrees with plan.   Final Clinical Impressions(s) / ED Diagnoses   Final diagnoses:  Gastroparesis  Non-intractable cyclical vomiting with nausea    ED Discharge Orders    None       Emi Holes, PA-C 03/08/18 1417    Bethann Berkshire, MD 03/08/18 1550

## 2018-03-08 NOTE — ED Notes (Signed)
Blood streak in stool, sample obtained.

## 2018-03-11 DIAGNOSIS — I10 Essential (primary) hypertension: Secondary | ICD-10-CM | POA: Diagnosis not present

## 2018-03-11 DIAGNOSIS — S88112A Complete traumatic amputation at level between knee and ankle, left lower leg, initial encounter: Secondary | ICD-10-CM | POA: Diagnosis not present

## 2018-03-11 DIAGNOSIS — Z794 Long term (current) use of insulin: Secondary | ICD-10-CM | POA: Diagnosis not present

## 2018-03-11 DIAGNOSIS — E1165 Type 2 diabetes mellitus with hyperglycemia: Secondary | ICD-10-CM | POA: Diagnosis not present

## 2018-03-11 DIAGNOSIS — K3184 Gastroparesis: Secondary | ICD-10-CM | POA: Diagnosis not present

## 2018-03-11 DIAGNOSIS — I2510 Atherosclerotic heart disease of native coronary artery without angina pectoris: Secondary | ICD-10-CM | POA: Diagnosis not present

## 2018-03-19 ENCOUNTER — Other Ambulatory Visit: Payer: Self-pay | Admitting: *Deleted

## 2018-03-19 NOTE — Patient Outreach (Signed)
[facility]) Care Management  03/19/2018  Samuel [name] 1951-01-05 [id number]   RN Health Coach attempted #1 screening outreach call to patient.  Patient was unavailable. HIPPA compliance voicemail message left with return callback number.  Plan: RN will call patient again within 10 business days.  Gean Maidens BSN RN [facility] Care Management [phone]

## 2018-04-08 DIAGNOSIS — E1165 Type 2 diabetes mellitus with hyperglycemia: Secondary | ICD-10-CM | POA: Diagnosis not present

## 2018-04-12 DIAGNOSIS — E1143 Type 2 diabetes mellitus with diabetic autonomic (poly)neuropathy: Secondary | ICD-10-CM | POA: Diagnosis not present

## 2018-04-12 DIAGNOSIS — I110 Hypertensive heart disease with heart failure: Secondary | ICD-10-CM | POA: Diagnosis not present

## 2018-04-12 DIAGNOSIS — Z7982 Long term (current) use of aspirin: Secondary | ICD-10-CM | POA: Diagnosis not present

## 2018-04-12 DIAGNOSIS — I2510 Atherosclerotic heart disease of native coronary artery without angina pectoris: Secondary | ICD-10-CM | POA: Diagnosis not present

## 2018-04-12 DIAGNOSIS — Z7901 Long term (current) use of anticoagulants: Secondary | ICD-10-CM | POA: Diagnosis not present

## 2018-04-12 DIAGNOSIS — Z89512 Acquired absence of left leg below knee: Secondary | ICD-10-CM | POA: Diagnosis not present

## 2018-04-12 DIAGNOSIS — Z9181 History of falling: Secondary | ICD-10-CM | POA: Diagnosis not present

## 2018-04-12 DIAGNOSIS — Z86711 Personal history of pulmonary embolism: Secondary | ICD-10-CM | POA: Diagnosis not present

## 2018-04-12 DIAGNOSIS — K3184 Gastroparesis: Secondary | ICD-10-CM | POA: Diagnosis not present

## 2018-04-12 DIAGNOSIS — G4733 Obstructive sleep apnea (adult) (pediatric): Secondary | ICD-10-CM | POA: Diagnosis not present

## 2018-04-12 DIAGNOSIS — J40 Bronchitis, not specified as acute or chronic: Secondary | ICD-10-CM | POA: Diagnosis not present

## 2018-04-12 DIAGNOSIS — Z794 Long term (current) use of insulin: Secondary | ICD-10-CM | POA: Diagnosis not present

## 2018-04-12 DIAGNOSIS — I872 Venous insufficiency (chronic) (peripheral): Secondary | ICD-10-CM | POA: Diagnosis not present

## 2018-04-12 DIAGNOSIS — I5032 Chronic diastolic (congestive) heart failure: Secondary | ICD-10-CM | POA: Diagnosis not present

## 2018-04-12 DIAGNOSIS — M25562 Pain in left knee: Secondary | ICD-10-CM | POA: Diagnosis not present

## 2018-04-12 DIAGNOSIS — E1140 Type 2 diabetes mellitus with diabetic neuropathy, unspecified: Secondary | ICD-10-CM | POA: Diagnosis not present

## 2018-04-12 DIAGNOSIS — Z955 Presence of coronary angioplasty implant and graft: Secondary | ICD-10-CM | POA: Diagnosis not present

## 2018-04-12 DIAGNOSIS — Z7902 Long term (current) use of antithrombotics/antiplatelets: Secondary | ICD-10-CM | POA: Diagnosis not present

## 2018-04-12 DIAGNOSIS — Z86718 Personal history of other venous thrombosis and embolism: Secondary | ICD-10-CM | POA: Diagnosis not present

## 2018-04-12 DIAGNOSIS — E1151 Type 2 diabetes mellitus with diabetic peripheral angiopathy without gangrene: Secondary | ICD-10-CM | POA: Diagnosis not present

## 2018-04-16 ENCOUNTER — Ambulatory Visit: Payer: PPO | Admitting: *Deleted

## 2018-04-18 ENCOUNTER — Emergency Department (HOSPITAL_COMMUNITY)
Admission: EM | Admit: 2018-04-18 | Discharge: 2018-04-18 | Disposition: A | Discharge status: Home / Self Care | Payer: PPO | Attending: Emergency Medicine | Admitting: Emergency Medicine

## 2018-04-18 ENCOUNTER — Encounter (HOSPITAL_COMMUNITY): Payer: Self-pay

## 2018-04-18 ENCOUNTER — Other Ambulatory Visit: Payer: Self-pay

## 2018-04-18 DIAGNOSIS — E785 Hyperlipidemia, unspecified: Secondary | ICD-10-CM | POA: Diagnosis not present

## 2018-04-18 DIAGNOSIS — I959 Hypotension, unspecified: Secondary | ICD-10-CM | POA: Diagnosis not present

## 2018-04-18 DIAGNOSIS — K219 Gastro-esophageal reflux disease without esophagitis: Secondary | ICD-10-CM | POA: Diagnosis not present

## 2018-04-18 DIAGNOSIS — I10 Essential (primary) hypertension: Secondary | ICD-10-CM | POA: Insufficient documentation

## 2018-04-18 DIAGNOSIS — Z79899 Other long term (current) drug therapy: Secondary | ICD-10-CM | POA: Diagnosis not present

## 2018-04-18 DIAGNOSIS — Z86711 Personal history of pulmonary embolism: Secondary | ICD-10-CM | POA: Insufficient documentation

## 2018-04-18 DIAGNOSIS — R1111 Vomiting without nausea: Secondary | ICD-10-CM | POA: Diagnosis not present

## 2018-04-18 DIAGNOSIS — R1084 Generalized abdominal pain: Secondary | ICD-10-CM | POA: Diagnosis not present

## 2018-04-18 DIAGNOSIS — R112 Nausea with vomiting, unspecified: Secondary | ICD-10-CM

## 2018-04-18 DIAGNOSIS — E119 Type 2 diabetes mellitus without complications: Secondary | ICD-10-CM | POA: Insufficient documentation

## 2018-04-18 DIAGNOSIS — Z7901 Long term (current) use of anticoagulants: Secondary | ICD-10-CM | POA: Diagnosis not present

## 2018-04-18 DIAGNOSIS — I2510 Atherosclerotic heart disease of native coronary artery without angina pectoris: Secondary | ICD-10-CM | POA: Diagnosis not present

## 2018-04-18 DIAGNOSIS — R110 Nausea: Secondary | ICD-10-CM | POA: Diagnosis not present

## 2018-04-18 DIAGNOSIS — R0689 Other abnormalities of breathing: Secondary | ICD-10-CM | POA: Diagnosis not present

## 2018-04-18 LAB — CBC WITH DIFFERENTIAL/PLATELET
ABS IMMATURE GRANULOCYTES: 0.03 10*3/uL (ref 0.00–0.07)
BASOS ABS: 0 10*3/uL (ref 0.0–0.1)
Basophils Relative: 0 %
Eosinophils Absolute: 0 10*3/uL (ref 0.0–0.5)
Eosinophils Relative: 0 %
HEMATOCRIT: 43.9 % (ref 39.0–52.0)
HEMOGLOBIN: 14.8 g/dL (ref 13.0–17.0)
Immature Granulocytes: 0 %
LYMPHS ABS: 1.9 10*3/uL (ref 0.7–4.0)
Lymphocytes Relative: 18 %
MCH: 29.8 pg (ref 26.0–34.0)
MCHC: 33.7 g/dL (ref 30.0–36.0)
MCV: 88.3 fL (ref 80.0–100.0)
MONO ABS: 0.7 10*3/uL (ref 0.1–1.0)
Monocytes Relative: 6 %
NEUTROS ABS: 7.8 10*3/uL — AB (ref 1.7–7.7)
Neutrophils Relative %: 76 %
Platelets: 352 10*3/uL (ref 150–400)
RBC: 4.97 MIL/uL (ref 4.22–5.81)
RDW: 13.2 % (ref 11.5–15.5)
WBC: 10.5 10*3/uL (ref 4.0–10.5)
nRBC: 0 % (ref 0.0–0.2)

## 2018-04-18 LAB — COMPREHENSIVE METABOLIC PANEL
ALBUMIN: 3.7 g/dL (ref 3.5–5.0)
ALK PHOS: 84 U/L (ref 38–126)
ALT: 20 U/L (ref 0–44)
AST: 25 U/L (ref 15–41)
Anion gap: 13 (ref 5–15)
BUN: 22 mg/dL (ref 8–23)
CHLORIDE: 104 mmol/L (ref 98–111)
CO2: 21 mmol/L — ABNORMAL LOW (ref 22–32)
CREATININE: 1.07 mg/dL (ref 0.61–1.24)
Calcium: 9.5 mg/dL (ref 8.9–10.3)
GFR calc non Af Amer: >60 mL/min (ref >60)
GLUCOSE: 202 mg/dL — AB (ref 70–99)
Potassium: 3.9 mmol/L (ref 3.5–5.1)
SODIUM: 138 mmol/L (ref 135–145)
Total Bilirubin: 0.7 mg/dL (ref 0.3–1.2)
Total Protein: 7.7 g/dL (ref 6.5–8.1)

## 2018-04-18 LAB — LIPASE, BLOOD: Lipase: 18 U/L (ref 11–51)

## 2018-04-18 MED ORDER — PROMETHAZINE HCL 25 MG RE SUPP: 25.0000 mg | Freq: Four times a day (QID) | RECTAL | 0 refills | Status: DC | PRN

## 2018-04-18 MED ORDER — SODIUM CHLORIDE 0.9 % IV BOLUS
1000.0000 mL | Freq: Once | INTRAVENOUS | Status: AC
  Administered 2018-04-18: 1000 mL via INTRAVENOUS

## 2018-04-18 MED ORDER — PROMETHAZINE HCL 25 MG/ML IJ SOLN
12.5000 mg | Freq: Once | INTRAMUSCULAR | Status: AC
  Administered 2018-04-18: 12.5 mg via INTRAVENOUS
  Filled 2018-04-18: qty 1

## 2018-04-18 NOTE — Discharge Instructions (Signed)
You have been seen in the Emergency Department (ED) today for nausea and vomiting.  Your work up today has not shown a clear cause for your symptoms. You have been prescribed Phenergan; please use as prescribed as needed for your nausea.  Follow up with your doctor as soon as possible regarding today's emergent visit and your symptoms of nausea.   Return to the ED if you develop abdominal, bloody vomiting, bloody diarrhea, if you are unable to tolerate fluids due to vomiting, or if you develop other symptoms that concern you.  

## 2018-04-18 NOTE — ED Triage Notes (Signed)
Pt BIB EMS from home with N/V since 4 this morning. Pt also reports abd pain. Hx of gastroporesis. Denies chest pain and dizziness. Pt has left leg below knee amputation. Hx of diabetes.  20G LH  4mg  Zofran  CBG 221 132/84 HR 104 98% RA

## 2018-04-18 NOTE — ED Provider Notes (Signed)
Emergency Department Provider Note   I have reviewed the triage vital signs and the nursing notes.   HISTORY  Chief Complaint No chief complaint on file.   HPI Samuel [name] is a 67 y.o. male with PMH of CAD, GERD, DM, Gastroparesis, HLD, HTN, and prior PE on Eliquis presents to the emergency department for evaluation of nausea and vomiting since 4 AM this morning.  The patient has some associated diffuse abdominal pain.  He states this is typical of his gastroparesis episodes and does not feel new or different in any way.  He denies any chest pain, shortness of breath, heart palpitations, lightheadedness. Denies any diarrhea. No fever or chills.  Past Medical History:  Diagnosis Date  . Anxiety   . Arthritis    "all over"   . CAD (coronary artery disease)   . Charcot's joint    "left foot"  . 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 Depression   . Gastroparesis   . GERD (gastroesophageal reflux disease)   . H/O hiatal hernia   . Hyperlipidemia   . Hypertension   . Myocardial infarction (HCC) 2017/03/27   around this date  . OSA (obstructive sleep apnea)    "not bad enough for a mask"  . Peripheral neuropathy   . Peripheral vascular disease (HCC)   . PONV (postoperative nausea and vomiting)   . Pulmonary embolism (HCC)    hx. of 2012  . Shortness of breath    exertion  . Type II diabetes mellitus [facility])     Patient Active Problem List   Diagnosis Date Noted  . DKA, type 2 (HCC) 02/03/2018  . DOE (dyspnea on exertion) 01/24/2018  . Uncontrolled diabetes mellitus (HCC) 09/23/2017  . DKA, type 2, not at goal [facility]) 09/22/2017  . S/P BKA (below knee amputation) (HCC) 04/19/2017  . COPD (chronic obstructive pulmonary disease) (HCC) 04/19/2017  . NSTEMI (non-ST elevated myocardial infarction) (HCC) 04/07/2017  . Osteomyelitis of foot (HCC)   . Protein-calorie malnutrition, severe (HCC) 03/08/2015  . Osteomyelitis of foot, acute (HCC) 03/07/2015    . Diabetes mellitus type 2, uncontrolled (HCC) 03/07/2015  . Normocytic normochromic anemia 03/07/2015  . Obesity (BMI 30-39.9) 03/07/2015  . Nausea & vomiting   . DM (diabetes mellitus), type 2 (HCC) 01/07/2014  . Chronic osteomyelitis involving lower leg (HCC) 01/07/2014  . Vomiting 12/22/2013  . Gastroparesis 12/21/2013  . Intractable nausea and vomiting 12/21/2013  . Essential hypertension 05/01/2013  . Hyperlipidemia due to type 2 diabetes mellitus (HCC) 05/01/2013  . Generalized weakness 03/20/2013  . Back pain 03/20/2013  . Pulmonary embolism (HCC) 03/27/2011  . PERFORATION OF GALLBLADDER 08/30/2010  . Ulcer of lower limb, unspecified 03/01/2010  . METHICILLIN SUSCEPTIBLE STAPH AUREUS SEPTICEMIA 01/25/2010  . Acute osteomyelitis, ankle and foot 01/25/2010  . NAUSEA AND VOMITING 01/25/2010  . GERD 11/09/2009  . CAD S/P DES PCI to mLAD: Xience DES 2.75 x 15 (3.0 mm) 05/01/2009  . DIABETES, TYPE 1 05/14/2008  . OBSTRUCTIVE SLEEP APNEA 04/30/2008  . ALLERGIC RHINITIS, SEASONAL 04/30/2008    Past Surgical History:  Procedure Laterality Date  . AMPUTATION Left 03/08/2015   Procedure:  LEFT AMPUTATION BELOW KNEE;  Surgeon: Toni Arthurs, MD;  Location: [facility] ORS;  Service: Orthopedics;  Laterality: Left;  . APPLICATION OF WOUND VAC Left 01/07/2014  . CHOLECYSTECTOMY  06/2010  . CORONARY ANGIOPLASTY WITH STENT PLACEMENT  05/2009   "1"  . FOOT SURGERY Left 2010   "for  Charcot's joint"  . HARDWARE REMOVAL Left 01/07/2014   Procedure: LEFT LEG REMOVAL OF DEEP IMPLANT AND SEQUESTRECTOMY; APPLICATION OF WOUND VAC ;  Surgeon: Toni Arthurs, MD;  Location: [facility] OR;  Service: Orthopedics;  Laterality: Left;  . I&D EXTREMITY Left "multiple"   leg  . 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 IM NAILING TIBIA Left ~ 2012  . LEFT HEART CATH AND CORONARY  ANGIOGRAPHY N/A 04/08/2017   Procedure: LEFT HEART CATH AND CORONARY ANGIOGRAPHY;  Surgeon: Runell Gess, MD;  Location: [facility] INVASIVE CV LAB;  Service: Cardiovascular;  Laterality: N/A;  . SHOULDER ARTHROSCOPY W/ ROTATOR CUFF REPAIR Left    "and bone spurs"  . TIBIAL IM ROD REMOVAL Left 01/07/2014  . TOE AMPUTATION Right ~ 2011   "great toe"  . VENA CAVA FILTER PLACEMENT  2012  . VENA CAVA FILTER PLACEMENT N/A 11/20/2016   Procedure: INSERTION VENA-CAVA FILTER;  Surgeon: Sherren Kerns, MD;  Location: [facility] OR;  Service: Vascular;  Laterality: N/A;  . WOUND DEBRIDEMENT Left 01/07/2014   "tibia"   Allergies Gabapentin; Nabumetone; Reglan [metoclopramide]; and Codeine  Family History  Problem Relation Age of Onset  . COPD Mother   . Heart disease Mother   . Lung cancer Mother   . Retinal detachment Father   . Alcoholism Brother   . Heart disease Brother   . COPD Brother   . Diabetes Brother   . Hypertension Brother   . Stroke Brother     Social History Social History   Tobacco Use  . Smoking status: Never Smoker  . Smokeless tobacco: Never Used  Substance Use Topics  . Alcohol use: No    Frequency: Never    Comment: 01/07/2014 'might have a wine cooler a couple times/yr"  . Drug use: No    Review of Systems  Constitutional: No fever/chills Eyes: No visual changes. ENT: No sore throat. Cardiovascular: Denies chest pain. Respiratory: Denies shortness of breath. Gastrointestinal: Positive diffuse abdominal pain. Positive nausea and vomiting.  No diarrhea.  No constipation. Genitourinary: Negative for dysuria. Musculoskeletal: Negative for back pain. Skin: Negative for rash. Neurological: Negative for headaches, focal weakness or numbness.  10-point ROS otherwise negative.  ____________________________________________   PHYSICAL EXAM:  VITAL SIGNS: ED Triage Vitals  Enc Vitals Group     BP 04/18/18 2001 131/66     Pulse Rate 04/18/18 2001 85     Resp  04/18/18 2001 (!) 32     Temp 04/18/18 2001 98 F (36.7 C)     Temp Source 04/18/18 2001 Oral     SpO2 04/18/18 2001 95 %     Weight 04/18/18 2001 265 lb (120.2 kg)     Height 04/18/18 2001 6\' 3"  (1.905 m)     Pain Score 04/18/18 1954 0   Constitutional: Alert and oriented. Patient providing a full history but appears uncomfortable.  Eyes: Conjunctivae are normal. Head: Atraumatic. Nose: No congestion/rhinnorhea. Mouth/Throat: Mucous membranes are moist.  Neck: No stridor.  Cardiovascular: Normal rate, regular rhythm. Good peripheral circulation. Grossly normal heart sounds.   Respiratory: Normal respiratory effort.  No retractions. Lungs CTAB. Gastrointestinal: Soft and nontender. No distention.  Musculoskeletal: No lower extremity tenderness nor edema. No gross deformities of extremities. Neurologic:  Normal speech and language. No gross focal neurologic deficits are appreciated.  Skin:  Skin is warm, dry  and intact. No rash noted.  ____________________________________________   LABS (all labs ordered are listed, but only abnormal results are displayed)  Labs Reviewed  COMPREHENSIVE METABOLIC PANEL - Abnormal; Notable for the following components:      Result Value   CO2 21 (*)    Glucose, Bld 202 (*)    All other components within normal limits  CBC WITH DIFFERENTIAL/PLATELET - Abnormal; Notable for the following components:   Neutro Abs 7.8 (*)    All other components within normal limits  LIPASE, BLOOD   ____________________________________________   PROCEDURES  Procedure(s) performed:   Procedures  None ____________________________________________   INITIAL IMPRESSION / ASSESSMENT AND PLAN / ED COURSE  Pertinent labs & imaging results that were available during my care of the patient were reviewed by me and considered in my medical decision making (see chart for details).  Patient presents to the emergency department with diffuse abdominal pain, nausea,  vomiting.  Symptoms are consistent with his past episodes of gastroparesis.  Very low suspicion for cardiac etiology.  Patient has had this presentation multiple times and states this is the same.  He states that he typically responds well to Phenergan.  Labs reviewed with no acute findings.   Called to bedside.  Patient is feeling much better and wishing to go.  He does have Phenergan at home.  I will discharge with prescription for suppository Phenergan. Discussed PCP follow up plan and ED return precautions.   At this time, I do not feel there is any life-threatening condition present. I have reviewed and discussed all results (EKG, imaging, lab, urine as appropriate), exam findings with patient. I have reviewed nursing notes and appropriate previous records.  I feel the patient is safe to be discharged home without further emergent workup. Discussed usual and customary return precautions. Patient and family (if present) verbalize understanding and are comfortable with this plan.  Patient will follow-up with their primary care provider. If they do not have a primary care provider, information for follow-up has been provided to them. All questions have been answered.  ____________________________________________  FINAL CLINICAL IMPRESSION(S) / ED DIAGNOSES  Final diagnoses:  Generalized abdominal pain  Non-intractable vomiting with nausea, unspecified vomiting type     MEDICATIONS GIVEN DURING THIS VISIT:  Medications  sodium chloride 0.9 % bolus 1,000 mL (0 mLs Intravenous Stopped 04/18/18 2134)  promethazine (PHENERGAN) injection 12.5 mg (12.5 mg Intravenous Given 04/18/18 2036)     NEW OUTPATIENT MEDICATIONS STARTED DURING THIS VISIT:  Discharge Medication List as of 04/18/2018  9:29 PM    START taking these medications   Details  promethazine (PHENERGAN) 25 MG suppository Place 1 suppository (25 mg total) rectally every 6 (six) hours as needed for nausea or vomiting., Starting Fri  04/18/2018, Print        Note:  This document was prepared using Dragon voice recognition software and may include unintentional dictation errors.  Alona Bene, MD Emergency Medicine    Susanne Baumgarner, Arlyss Repress, MD 04/18/18 [phone]

## 2018-04-22 ENCOUNTER — Emergency Department (HOSPITAL_COMMUNITY)
Admission: EM | Admit: 2018-04-22 | Discharge: 2018-04-22 | Disposition: A | Discharge status: Home / Self Care | Payer: PPO | Attending: Emergency Medicine | Admitting: Emergency Medicine

## 2018-04-22 ENCOUNTER — Encounter (HOSPITAL_COMMUNITY): Payer: Self-pay

## 2018-04-22 DIAGNOSIS — E109 Type 1 diabetes mellitus without complications: Secondary | ICD-10-CM | POA: Insufficient documentation

## 2018-04-22 DIAGNOSIS — K3184 Gastroparesis: Secondary | ICD-10-CM | POA: Insufficient documentation

## 2018-04-22 DIAGNOSIS — R1111 Vomiting without nausea: Secondary | ICD-10-CM | POA: Diagnosis not present

## 2018-04-22 DIAGNOSIS — E119 Type 2 diabetes mellitus without complications: Secondary | ICD-10-CM | POA: Insufficient documentation

## 2018-04-22 DIAGNOSIS — R110 Nausea: Secondary | ICD-10-CM | POA: Diagnosis not present

## 2018-04-22 DIAGNOSIS — Z79899 Other long term (current) drug therapy: Secondary | ICD-10-CM | POA: Insufficient documentation

## 2018-04-22 DIAGNOSIS — R112 Nausea with vomiting, unspecified: Secondary | ICD-10-CM | POA: Diagnosis not present

## 2018-04-22 DIAGNOSIS — J449 Chronic obstructive pulmonary disease, unspecified: Secondary | ICD-10-CM | POA: Insufficient documentation

## 2018-04-22 LAB — BLOOD GAS, VENOUS
Acid-Base Excess: 1.8 mmol/L (ref 0.0–2.0)
BICARBONATE: 23.7 mmol/L (ref 20.0–28.0)
FIO2: 21
O2 Saturation: 87.6 %
PH VEN: 7.507 — AB (ref 7.250–7.430)
PO2 VEN: 48.8 mmHg — AB (ref 32.0–45.0)
Patient temperature: 98
pCO2, Ven: 30 mmHg — ABNORMAL LOW (ref 44.0–60.0)

## 2018-04-22 LAB — COMPREHENSIVE METABOLIC PANEL
ALBUMIN: 3.8 g/dL (ref 3.5–5.0)
ALT: 21 U/L (ref 0–44)
ANION GAP: 12 (ref 5–15)
AST: 23 U/L (ref 15–41)
Alkaline Phosphatase: 82 U/L (ref 38–126)
BUN: 16 mg/dL (ref 8–23)
CHLORIDE: 100 mmol/L (ref 98–111)
CO2: 23 mmol/L (ref 22–32)
Calcium: 9.1 mg/dL (ref 8.9–10.3)
Creatinine, Ser: 0.88 mg/dL (ref 0.61–1.24)
GFR calc Af Amer: >60 mL/min (ref >60)
GLUCOSE: 204 mg/dL — AB (ref 70–99)
POTASSIUM: 3.8 mmol/L (ref 3.5–5.1)
Sodium: 135 mmol/L (ref 135–145)
Total Bilirubin: 1 mg/dL (ref 0.3–1.2)
Total Protein: 7.6 g/dL (ref 6.5–8.1)

## 2018-04-22 LAB — CBC WITH DIFFERENTIAL/PLATELET
Abs Immature Granulocytes: 0.01 10*3/uL (ref 0.00–0.07)
BASOS ABS: 0 10*3/uL (ref 0.0–0.1)
BASOS PCT: 0 %
EOS ABS: 0.1 10*3/uL (ref 0.0–0.5)
Eosinophils Relative: 1 %
HCT: 41.7 % (ref 39.0–52.0)
Hemoglobin: 13.8 g/dL (ref 13.0–17.0)
Immature Granulocytes: 0 %
Lymphocytes Relative: 15 %
Lymphs Abs: 1.2 10*3/uL (ref 0.7–4.0)
MCH: 29.6 pg (ref 26.0–34.0)
MCHC: 33.1 g/dL (ref 30.0–36.0)
MCV: 89.5 fL (ref 80.0–100.0)
MONOS PCT: 6 %
Monocytes Absolute: 0.5 10*3/uL (ref 0.1–1.0)
NEUTROS ABS: 6.1 10*3/uL (ref 1.7–7.7)
NEUTROS PCT: 78 %
NRBC: 0 % (ref 0.0–0.2)
PLATELETS: 303 10*3/uL (ref 150–400)
RBC: 4.66 MIL/uL (ref 4.22–5.81)
RDW: 13.1 % (ref 11.5–15.5)
WBC: 7.9 10*3/uL (ref 4.0–10.5)

## 2018-04-22 LAB — LIPASE, BLOOD: LIPASE: 19 U/L (ref 11–51)

## 2018-04-22 MED ORDER — PROMETHAZINE HCL 25 MG/ML IJ SOLN
12.5000 mg | Freq: Once | INTRAMUSCULAR | Status: AC
  Administered 2018-04-22: 12.5 mg via INTRAVENOUS
  Filled 2018-04-22: qty 1

## 2018-04-22 MED ORDER — PROMETHAZINE HCL 25 MG/ML IJ SOLN: 25.0000 mg | Freq: Once | INTRAMUSCULAR | Status: DC

## 2018-04-22 MED ORDER — SODIUM CHLORIDE 0.9 % IV BOLUS
1000.0000 mL | Freq: Once | INTRAVENOUS | Status: AC
  Administered 2018-04-22: 1000 mL via INTRAVENOUS

## 2018-04-22 MED ORDER — ONDANSETRON 4 MG PO TBDP: 4.0000 mg | ORAL_TABLET | Freq: Three times a day (TID) | ORAL | 0 refills | Status: AC | PRN

## 2018-04-22 MED ORDER — SODIUM CHLORIDE 0.9 % IV SOLN: INTRAVENOUS | Status: DC

## 2018-04-22 NOTE — ED Notes (Signed)
Bed: WA22 Expected date:  Expected time:  Means of arrival:  Comments: EMS-abdominal pain 

## 2018-04-22 NOTE — ED Provider Notes (Signed)
[facility]-EMERGENCY DEPT Provider Note  CSN: [id number] Arrival date & time: 04/22/18 1478  Chief Complaint(s) Emesis  HPI Samuel [name] is a 67 y.o. male with extended past medical history listed below including diabetes with gastroparesis who presents to the emergency department with 7 hours of intractable nausea and nonbloody nonbilious emesis with decreased oral tolerance.  Patient states that this is typical for his gastroparesis flare.  Has tried taking his Phenergan pills but cannot keep them down.  Was prescribed suppositories but he cannot afford them.  He denies any fevers or chills.  No chest pain or shortness of breath.  No coughing or congestion.  Endorses abdominal discomfort with emesis otherwise no abdominal pain.  No diarrhea or urinary symptoms.  HPI  Past Medical History Past Medical History:  Diagnosis Date  . Anxiety   . Arthritis    "all over"   . CAD (coronary artery disease)   . Charcot's joint    "left foot"  . 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 Depression   . Gastroparesis   . GERD (gastroesophageal reflux disease)   . H/O hiatal hernia   . Hyperlipidemia   . Hypertension   . Myocardial infarction (HCC) 2017/03/27   around this date  . OSA (obstructive sleep apnea)    "not bad enough for a mask"  . Peripheral neuropathy   . Peripheral vascular disease (HCC)   . PONV (postoperative nausea and vomiting)   . Pulmonary embolism (HCC)    hx. of 2012  . Shortness of breath    exertion  . Type II diabetes mellitus [facility])    Patient Active Problem List   Diagnosis Date Noted  . DKA, type 2 (HCC) 02/03/2018  . DOE (dyspnea on exertion) 01/24/2018  . Uncontrolled diabetes mellitus (HCC) 09/23/2017  . DKA, type 2, not at goal [facility]) 09/22/2017  . S/P BKA (below knee amputation) (HCC) 04/19/2017  . COPD (chronic obstructive pulmonary disease) (HCC) 04/19/2017  . NSTEMI (non-ST elevated myocardial infarction)  (HCC) 04/07/2017  . Osteomyelitis of foot (HCC)   . Protein-calorie malnutrition, severe (HCC) 03/08/2015  . Osteomyelitis of foot, acute (HCC) 03/07/2015  . Diabetes mellitus type 2, uncontrolled (HCC) 03/07/2015  . Normocytic normochromic anemia 03/07/2015  . Obesity (BMI 30-39.9) 03/07/2015  . Nausea & vomiting   . DM (diabetes mellitus), type 2 (HCC) 01/07/2014  . Chronic osteomyelitis involving lower leg (HCC) 01/07/2014  . Vomiting 12/22/2013  . Gastroparesis 12/21/2013  . Intractable nausea and vomiting 12/21/2013  . Essential hypertension 05/01/2013  . Hyperlipidemia due to type 2 diabetes mellitus (HCC) 05/01/2013  . Generalized weakness 03/20/2013  . Back pain 03/20/2013  . Pulmonary embolism (HCC) 03/27/2011  . PERFORATION OF GALLBLADDER 08/30/2010  . Ulcer of lower limb, unspecified 03/01/2010  . METHICILLIN SUSCEPTIBLE STAPH AUREUS SEPTICEMIA 01/25/2010  . Acute osteomyelitis, ankle and foot 01/25/2010  . NAUSEA AND VOMITING 01/25/2010  . GERD 11/09/2009  . CAD S/P DES PCI to mLAD: Xience DES 2.75 x 15 (3.0 mm) 05/01/2009  . DIABETES, TYPE 1 05/14/2008  . OBSTRUCTIVE SLEEP APNEA 04/30/2008  . ALLERGIC RHINITIS, SEASONAL 04/30/2008   Home Medication(s) Prior to Admission medications   Medication Sig Start Date End Date Taking? Authorizing Provider  amLODipine (NORVASC) 10 MG tablet Take 1 tablet (10 mg total) by mouth daily. 11/22/16  Yes Richarda Overlie, MD  apixaban (ELIQUIS) 5 MG TABS tablet Take 1 tablet (5 mg total) by mouth 2 (two) times  daily. 09/24/17  Yes Osvaldo Shipper, MD  aspirin 81 MG EC tablet Take 1 tablet (81 mg total) by mouth daily. 04/12/17  Yes Glade Lloyd, MD  atorvastatin (LIPITOR) 80 MG tablet Take 1 tablet (80 mg total) by mouth daily at 6 PM. 04/11/17  Yes Hanley Ben, Kshitiz, MD  clopidogrel (PLAVIX) 75 MG tablet Take 1 tablet (75 mg total) by mouth daily. 04/12/17  Yes Glade Lloyd, MD  ferrous sulfate 325 (65 FE) MG tablet Take 325 mg by mouth  every evening.    Yes [provider]  furosemide (LASIX) 20 MG tablet Take 1 tablet (20 mg total) by mouth daily. 04/12/17  Yes Glade Lloyd, MD  gabapentin (NEURONTIN) 300 MG capsule Take 300 mg by mouth 4 (four) times daily.    Yes [provider]  ibuprofen (ADVIL,MOTRIN) 200 MG tablet Take 400 mg by mouth every 6 (six) hours as needed for moderate pain.   Yes [provider]  insulin degludec (TRESIBA FLEXTOUCH) 100 UNIT/ML SOPN FlexTouch Pen Inject 40 Units into the skin daily.   Yes Averneni, Carmon Sails, MD  insulin lispro (HUMALOG) 100 UNIT/ML injection Inject 12 units before each meal plus sliding scale   Yes Averneni, Madhavi, MD  lansoprazole (PREVACID) 30 MG capsule Take 30 mg by mouth 2 (two) times daily. 03/19/17  Yes [provider]  metFORMIN (GLUCOPHAGE-XR) 500 MG 24 hr tablet Take 500 mg by mouth 2 (two) times daily.  02/18/18  Yes [provider]  metoCLOPramide (REGLAN) 5 MG tablet Take 1 tablet (5 mg total) by mouth 3 (three) times daily. 01/02/18 04/22/18 Yes Noralee Stain, DO  metoprolol tartrate (LOPRESSOR) 25 MG tablet Take 1 tablet (25 mg total) by mouth 2 (two) times daily. 04/11/17  Yes Glade Lloyd, MD  ranitidine (ZANTAC) 150 MG tablet Take 150 mg by mouth 2 (two) times daily.    Yes [provider]  ranolazine (RANEXA) 500 MG 12 hr tablet Take 1 tablet (500 mg total) by mouth 2 (two) times daily. 04/11/17  Yes Glade Lloyd, MD  traMADol (ULTRAM) 50 MG tablet Take 100 mg by mouth every 6 (six) hours as needed for moderate pain.    Yes [provider]  nitroGLYCERIN (NITROSTAT) 0.4 MG SL tablet Place 1 tablet (0.4 mg total) under the tongue every 5 (five) minutes as needed for chest pain. 04/11/17   Glade Lloyd, MD  ondansetron (ZOFRAN ODT) 4 MG disintegrating tablet Take 1 tablet (4 mg total) by mouth every 8 (eight) hours as needed for up to 3 days for nausea or vomiting. 04/22/18 04/25/18  Nira Conn, MD  [facility] VERIO test strip Use to test blood sugar twice daily 09/24/17   [provider]  promethazine (PHENERGAN) 25 MG suppository Place 1 suppository (25 mg total) rectally every 6 (six) hours as needed for nausea or vomiting. Patient not taking: Reported on 04/22/2018 04/18/18   Long, Arlyss Repress, MD  Past Surgical History Past Surgical History:  Procedure Laterality Date  . AMPUTATION Left 03/08/2015   Procedure:  LEFT AMPUTATION BELOW KNEE;  Surgeon: Toni Arthurs, MD;  Location: [facility] ORS;  Service: Orthopedics;  Laterality: Left;  . APPLICATION OF WOUND VAC Left 01/07/2014  . CHOLECYSTECTOMY  06/2010  . CORONARY ANGIOPLASTY WITH STENT PLACEMENT  05/2009   "1"  . FOOT SURGERY Left 2010   "for Charcot's joint"  . HARDWARE REMOVAL Left 01/07/2014   Procedure: LEFT LEG REMOVAL OF DEEP IMPLANT AND SEQUESTRECTOMY; APPLICATION OF WOUND VAC ;  Surgeon: Toni Arthurs, MD;  Location: [facility] OR;  Service: Orthopedics;  Laterality: Left;  . I&D EXTREMITY Left "multiple"   leg  . 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 IM NAILING TIBIA Left ~ 2012  . LEFT HEART CATH AND CORONARY ANGIOGRAPHY N/A 04/08/2017   Procedure: LEFT HEART CATH AND CORONARY ANGIOGRAPHY;  Surgeon: Runell Gess, MD;  Location: [facility] INVASIVE CV LAB;  Service: Cardiovascular;  Laterality: N/A;  . SHOULDER ARTHROSCOPY W/ ROTATOR CUFF REPAIR Left    "and bone spurs"  . TIBIAL IM ROD REMOVAL Left 01/07/2014  . TOE AMPUTATION Right ~ 2011   "great toe"  . VENA CAVA FILTER PLACEMENT  2012  . VENA CAVA FILTER PLACEMENT N/A 11/20/2016   Procedure: INSERTION VENA-CAVA FILTER;  Surgeon: Sherren Kerns, MD;  Location[facility] OR;  Service: Vascular;  Laterality:  N/A;  . WOUND DEBRIDEMENT Left 01/07/2014   "tibia"   Family History Family History  Problem Relation Age of Onset  . COPD Mother   . Heart disease Mother   . Lung cancer Mother   . Retinal detachment Father   . Alcoholism Brother   . Heart disease Brother   . COPD Brother   . Diabetes Brother   . Hypertension Brother   . Stroke Brother     Social History Social History   Tobacco Use  . Smoking status: Never Smoker  . Smokeless tobacco: Never Used  Substance Use Topics  . Alcohol use: No    Frequency: Never    Comment: 01/07/2014 'might have a wine cooler a couple times/yr"  . Drug use: No   Allergies Gabapentin; Nabumetone; Reglan [metoclopramide]; and Codeine  Review of Systems Review of Systems All other systems are reviewed and are negative for acute change except as noted in the HPI  Physical Exam Vital Signs  I have reviewed the triage vital signs BP (!) 154/87   Pulse 88   Temp 98 F (36.7 C) (Oral)   Resp 19   Ht 6\' 3"  (1.905 m)   Wt 120 kg   SpO2 97%   BMI 33.07 kg/m   Physical Exam  Constitutional: He is oriented to person, place, and time. He appears well-developed and well-nourished. No distress.  HENT:  Head: Normocephalic and atraumatic.  Right Ear: External ear normal.  Left Ear: External ear normal.  Nose: Nose normal.  Mouth/Throat: Mucous membranes are normal. No trismus in the jaw.  Eyes: Conjunctivae and EOM are normal. No scleral icterus.  Neck: Normal range of motion and phonation normal.  Cardiovascular: Normal rate and regular rhythm.  Pulmonary/Chest: Effort normal. No stridor. No respiratory distress.  Abdominal: He exhibits no distension. There is no tenderness. There is no rigidity, no rebound and no guarding.  Musculoskeletal: Normal range of motion.  He exhibits no edema.       Legs: Neurological: He is alert and oriented to person, place, and time.  Skin: He is not diaphoretic.  Psychiatric: He has a normal mood and  affect. His behavior is normal.  Vitals reviewed.   ED Results and Treatments Labs (all labs ordered are listed, but only abnormal results are displayed) Labs Reviewed  BLOOD GAS, VENOUS - Abnormal; Notable for the following components:      Result Value   pH, Ven 7.507 (*)    pCO2, Ven 30.0 (*)    pO2, Ven 48.8 (*)    All other components within normal limits  COMPREHENSIVE METABOLIC PANEL - Abnormal; Notable for the following components:   Glucose, Bld 204 (*)    All other components within normal limits  CBC WITH DIFFERENTIAL/PLATELET  LIPASE, BLOOD                                                                                                                         EKG  EKG Interpretation  Date/Time:  Tuesday April 22 2018 03:18:37 EST Ventricular Rate:  87 PR Interval:    QRS Duration: 84 QT Interval:  387 QTC Calculation: 466 R Axis:   34 Text Interpretation:  Sinus rhythm Abnormal R-wave progression, early transition No significant change since last tracing Confirmed by Drema Pry [phone]) on 04/22/2018 3:25:32 AM      Radiology No results found. Pertinent labs & imaging results that were available during my care of the patient were reviewed by me and considered in my medical decision making (see chart for details).  Medications Ordered in ED Medications  sodium chloride 0.9 % bolus 1,000 mL (0 mLs Intravenous Stopped 04/22/18 0530)    And  0.9 %  sodium chloride infusion (has no administration in time range)  promethazine (PHENERGAN) injection 12.5 mg (12.5 mg Intravenous Given 04/22/18 0430)                                                                                                                                    Procedures Procedures  (including critical care time)  Medical Decision Making / ED Course I have reviewed the nursing notes for this encounter and the patient's prior records (if available in EHR or on provided paperwork).    Patient  here with nausea and vomiting from gastroparesis flare.  Labs grossly reassuring without leukocytosis  or anemia.  No significant electrolyte derangements or renal insufficiency.  No evidence of biliary obstruction or pancreatitis.  Noted to have hyperglycemia without evidence of DKA.  Treated symptomatically with IV Phenergan and IV fluids resulting in significant improvement.  Able to tolerate oral intake.  The patient appears reasonably screened and/or stabilized for discharge and I doubt any other medical condition or other [facility] requiring further screening, evaluation, or treatment in the ED at this time prior to discharge.  The patient is safe for discharge with strict return precautions.   Final Clinical Impression(s) / ED Diagnoses Final diagnoses:  Gastroparesis   Disposition: Discharge  Condition: Good  I have discussed the results, Dx and Tx plan with the patient who expressed understanding and agree(s) with the plan. Discharge instructions discussed at great length. The patient was given strict return precautions who verbalized understanding of the instructions. No further questions at time of discharge.    ED Discharge Orders         Ordered    ondansetron (ZOFRAN ODT) 4 MG disintegrating tablet  Every 8 hours PRN     04/22/18 0647           Follow Up: [name], MD [address] [place] [address] [phone]  Schedule an appointment as soon as possible for a visit  As needed     This chart was dictated using voice recognition software.  Despite best efforts to proofread,  errors can occur which can change the documentation meaning.   Nira Conn, MD 04/22/18 [phone]

## 2018-04-22 NOTE — ED Triage Notes (Addendum)
Pt arrived from home via [facility] due to a gastroparesis flare up. Pt given nausea medication states it is not strong enough, reports emesis over the last 7 hours.

## 2018-04-24 ENCOUNTER — Emergency Department (HOSPITAL_COMMUNITY)
Admission: EM | Admit: 2018-04-24 | Discharge: 2018-04-24 | Disposition: A | Discharge status: Home / Self Care | Payer: PPO | Attending: Emergency Medicine | Admitting: Emergency Medicine

## 2018-04-24 ENCOUNTER — Encounter (HOSPITAL_COMMUNITY): Payer: Self-pay | Admitting: *Deleted

## 2018-04-24 ENCOUNTER — Other Ambulatory Visit: Payer: Self-pay

## 2018-04-24 DIAGNOSIS — Z794 Long term (current) use of insulin: Secondary | ICD-10-CM | POA: Diagnosis not present

## 2018-04-24 DIAGNOSIS — Z7982 Long term (current) use of aspirin: Secondary | ICD-10-CM | POA: Diagnosis not present

## 2018-04-24 DIAGNOSIS — Z7901 Long term (current) use of anticoagulants: Secondary | ICD-10-CM | POA: Insufficient documentation

## 2018-04-24 DIAGNOSIS — I2510 Atherosclerotic heart disease of native coronary artery without angina pectoris: Secondary | ICD-10-CM | POA: Insufficient documentation

## 2018-04-24 DIAGNOSIS — K3184 Gastroparesis: Secondary | ICD-10-CM | POA: Diagnosis not present

## 2018-04-24 DIAGNOSIS — I10 Essential (primary) hypertension: Secondary | ICD-10-CM | POA: Insufficient documentation

## 2018-04-24 DIAGNOSIS — E119 Type 2 diabetes mellitus without complications: Secondary | ICD-10-CM | POA: Insufficient documentation

## 2018-04-24 DIAGNOSIS — E1165 Type 2 diabetes mellitus with hyperglycemia: Secondary | ICD-10-CM | POA: Diagnosis not present

## 2018-04-24 DIAGNOSIS — Z7902 Long term (current) use of antithrombotics/antiplatelets: Secondary | ICD-10-CM | POA: Insufficient documentation

## 2018-04-24 DIAGNOSIS — R110 Nausea: Secondary | ICD-10-CM | POA: Diagnosis not present

## 2018-04-24 DIAGNOSIS — R112 Nausea with vomiting, unspecified: Secondary | ICD-10-CM | POA: Diagnosis not present

## 2018-04-24 DIAGNOSIS — R000 Tachycardia, unspecified: Secondary | ICD-10-CM | POA: Diagnosis not present

## 2018-04-24 LAB — COMPREHENSIVE METABOLIC PANEL
ALK PHOS: 81 U/L (ref 38–126)
ALT: 24 U/L (ref 0–44)
AST: 27 U/L (ref 15–41)
Albumin: 3.9 g/dL (ref 3.5–5.0)
Anion gap: 14 (ref 5–15)
BILIRUBIN TOTAL: 1 mg/dL (ref 0.3–1.2)
BUN: 14 mg/dL (ref 8–23)
CALCIUM: 9.5 mg/dL (ref 8.9–10.3)
CO2: 21 mmol/L — ABNORMAL LOW (ref 22–32)
CREATININE: 0.95 mg/dL (ref 0.61–1.24)
Chloride: 101 mmol/L (ref 98–111)
GFR calc Af Amer: >60 mL/min (ref >60)
GFR calc non Af Amer: >60 mL/min (ref >60)
Glucose, Bld: 230 mg/dL — ABNORMAL HIGH (ref 70–99)
Potassium: 3.9 mmol/L (ref 3.5–5.1)
Sodium: 136 mmol/L (ref 135–145)
TOTAL PROTEIN: 7.7 g/dL (ref 6.5–8.1)

## 2018-04-24 LAB — BLOOD GAS, VENOUS
ACID-BASE EXCESS: 2.8 mmol/L — AB (ref 0.0–2.0)
Bicarbonate: 23.3 mmol/L (ref 20.0–28.0)
O2 SAT: 83.3 %
PATIENT TEMPERATURE: 98.6
PCO2 VEN: 25.5 mmHg — AB (ref 44.0–60.0)
pH, Ven: 7.569 — ABNORMAL HIGH (ref 7.250–7.430)
pO2, Ven: 41.3 mmHg (ref 32.0–45.0)

## 2018-04-24 LAB — CBC WITH DIFFERENTIAL/PLATELET
ABS IMMATURE GRANULOCYTES: 0.02 10*3/uL (ref 0.00–0.07)
BASOS ABS: 0 10*3/uL (ref 0.0–0.1)
BASOS PCT: 0 %
EOS ABS: 0.1 10*3/uL (ref 0.0–0.5)
EOS PCT: 1 %
HEMATOCRIT: 44.6 % (ref 39.0–52.0)
HEMOGLOBIN: 14.7 g/dL (ref 13.0–17.0)
IMMATURE GRANULOCYTES: 0 %
Lymphocytes Relative: 18 %
Lymphs Abs: 1.6 10*3/uL (ref 0.7–4.0)
MCH: 29.5 pg (ref 26.0–34.0)
MCHC: 33 g/dL (ref 30.0–36.0)
MCV: 89.4 fL (ref 80.0–100.0)
Monocytes Absolute: 0.6 10*3/uL (ref 0.1–1.0)
Monocytes Relative: 7 %
NRBC: 0 % (ref 0.0–0.2)
Neutro Abs: 6.7 10*3/uL (ref 1.7–7.7)
Neutrophils Relative %: 74 %
Platelets: 313 10*3/uL (ref 150–400)
RBC: 4.99 MIL/uL (ref 4.22–5.81)
RDW: 13.2 % (ref 11.5–15.5)
WBC: 9 10*3/uL (ref 4.0–10.5)

## 2018-04-24 LAB — I-STAT TROPONIN, ED: TROPONIN I, POC: 0 ng/mL (ref 0.00–0.08)

## 2018-04-24 MED ORDER — SODIUM CHLORIDE 0.9 % IV BOLUS
1000.0000 mL | Freq: Once | INTRAVENOUS | Status: AC
  Administered 2018-04-24: 1000 mL via INTRAVENOUS

## 2018-04-24 MED ORDER — SODIUM CHLORIDE 0.9 % IV SOLN: INTRAVENOUS | Status: DC

## 2018-04-24 MED ORDER — HALOPERIDOL LACTATE 5 MG/ML IJ SOLN
5.0000 mg | Freq: Once | INTRAMUSCULAR | Status: AC
  Administered 2018-04-24: 5 mg via INTRAVENOUS
  Filled 2018-04-24: qty 1

## 2018-04-24 NOTE — ED Triage Notes (Signed)
Per EMS pt from home with c/o nausea and vomiting, hx of gastroparesis, was seen for same 2 days ago. 4 mg zofran IM given en route.

## 2018-04-24 NOTE — ED Provider Notes (Signed)
[facility]-EMERGENCY DEPT Provider Note   CSN: [id number] Arrival date & time: 04/24/18  7829     History   Chief Complaint Chief Complaint  Patient presents with  . Emesis    HPI Samuel [name] is a 67 y.o. male.  Patient is a 67 year old male with history of PE on Eliquis, diabetes type 2, MI, gastroparesis who is presenting with recurrent nausea and vomiting.  Patient was seen 2 days ago for exacerbation of his gastroparesis.  At that time his lab work was reassuring, he was given IV Phenergan and fluids and his symptoms improved.  He states since getting home he was able to tolerate some chicken noodle soup last night but today has been dry heaving and having emesis all morning.  He did try an injection of Phenergan with no relief.  He denies fever, abdominal pain, urinary symptoms.  He has no chest pain or shortness of breath.  He states this feels just like his prior gastroparesis flares.  The history is provided by the patient and the spouse.    Past Medical History:  Diagnosis Date  . Anxiety   . Arthritis    "all over"   . CAD (coronary artery disease)   . Charcot's joint    "left foot"  . 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 Depression   . Gastroparesis   . GERD (gastroesophageal reflux disease)   . H/O hiatal hernia   . Hyperlipidemia   . Hypertension   . Myocardial infarction (HCC) 2017/03/27   around this date  . OSA (obstructive sleep apnea)    "not bad enough for a mask"  . Peripheral neuropathy   . Peripheral vascular disease (HCC)   . PONV (postoperative nausea and vomiting)   . Pulmonary embolism (HCC)    hx. of 2012  . Shortness of breath    exertion  . Type II diabetes mellitus [facility])     Patient Active Problem List   Diagnosis Date Noted  . DKA, type 2 (HCC) 02/03/2018  . DOE (dyspnea on exertion) 01/24/2018  . Uncontrolled diabetes mellitus (HCC) 09/23/2017  . DKA, type 2, not at goal [facility])  09/22/2017  . S/P BKA (below knee amputation) (HCC) 04/19/2017  . COPD (chronic obstructive pulmonary disease) (HCC) 04/19/2017  . NSTEMI (non-ST elevated myocardial infarction) (HCC) 04/07/2017  . Osteomyelitis of foot (HCC)   . Protein-calorie malnutrition, severe (HCC) 03/08/2015  . Osteomyelitis of foot, acute (HCC) 03/07/2015  . Diabetes mellitus type 2, uncontrolled (HCC) 03/07/2015  . Normocytic normochromic anemia 03/07/2015  . Obesity (BMI 30-39.9) 03/07/2015  . Nausea & vomiting   . DM (diabetes mellitus), type 2 (HCC) 01/07/2014  . Chronic osteomyelitis involving lower leg (HCC) 01/07/2014  . Vomiting 12/22/2013  . Gastroparesis 12/21/2013  . Intractable nausea and vomiting 12/21/2013  . Essential hypertension 05/01/2013  . Hyperlipidemia due to type 2 diabetes mellitus (HCC) 05/01/2013  . Generalized weakness 03/20/2013  . Back pain 03/20/2013  . Pulmonary embolism (HCC) 03/27/2011  . PERFORATION OF GALLBLADDER 08/30/2010  . Ulcer of lower limb, unspecified 03/01/2010  . METHICILLIN SUSCEPTIBLE STAPH AUREUS SEPTICEMIA 01/25/2010  . Acute osteomyelitis, ankle and foot 01/25/2010  . NAUSEA AND VOMITING 01/25/2010  . GERD 11/09/2009  . CAD S/P DES PCI to mLAD: Xience DES 2.75 x 15 (3.0 mm) 05/01/2009  . DIABETES, TYPE 1 05/14/2008  . OBSTRUCTIVE SLEEP APNEA 04/30/2008  . ALLERGIC RHINITIS, SEASONAL 04/30/2008    Past Surgical  History:  Procedure Laterality Date  . AMPUTATION Left 03/08/2015   Procedure:  LEFT AMPUTATION BELOW KNEE;  Surgeon: Toni Arthurs, MD;  Location: [facility] ORS;  Service: Orthopedics;  Laterality: Left;  . APPLICATION OF WOUND VAC Left 01/07/2014  . CHOLECYSTECTOMY  06/2010  . CORONARY ANGIOPLASTY WITH STENT PLACEMENT  05/2009   "1"  . FOOT SURGERY Left 2010   "for Charcot's joint"  . HARDWARE REMOVAL Left 01/07/2014   Procedure: LEFT LEG REMOVAL OF DEEP IMPLANT AND SEQUESTRECTOMY; APPLICATION OF WOUND VAC ;  Surgeon: Toni Arthurs, MD;  Location: [facility] OR;   Service: Orthopedics;  Laterality: Left;  . I&D EXTREMITY Left "multiple"   leg  . 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 IM NAILING TIBIA Left ~ 2012  . LEFT HEART CATH AND CORONARY ANGIOGRAPHY N/A 04/08/2017   Procedure: LEFT HEART CATH AND CORONARY ANGIOGRAPHY;  Surgeon: Runell Gess, MD;  Location: [facility] INVASIVE CV LAB;  Service: Cardiovascular;  Laterality: N/A;  . SHOULDER ARTHROSCOPY W/ ROTATOR CUFF REPAIR Left    "and bone spurs"  . TIBIAL IM ROD REMOVAL Left 01/07/2014  . TOE AMPUTATION Right ~ 2011   "great toe"  . VENA CAVA FILTER PLACEMENT  2012  . VENA CAVA FILTER PLACEMENT N/A 11/20/2016   Procedure: INSERTION VENA-CAVA FILTER;  Surgeon: Sherren Kerns, MD;  Location: [facility] OR;  Service: Vascular;  Laterality: N/A;  . WOUND DEBRIDEMENT Left 01/07/2014   "tibia"        Home Medications    Prior to Admission medications   Medication Sig Start Date End Date Taking? Authorizing Provider  amLODipine (NORVASC) 10 MG tablet Take 1 tablet (10 mg total) by mouth daily. 11/22/16  Yes Richarda Overlie, MD  apixaban (ELIQUIS) 5 MG TABS tablet Take 1 tablet (5 mg total) by mouth 2 (two) times daily. 09/24/17  Yes Osvaldo Shipper, MD  aspirin 81 MG EC tablet Take 1 tablet (81 mg total) by mouth daily. 04/12/17  Yes Glade Lloyd, MD  atorvastatin (LIPITOR) 80 MG tablet Take 1 tablet (80 mg total) by mouth daily at 6 PM. 04/11/17  Yes Hanley Ben, Kshitiz, MD  clopidogrel (PLAVIX) 75 MG tablet Take 1 tablet (75 mg total) by mouth daily. 04/12/17  Yes Glade Lloyd, MD  ferrous sulfate 325 (65 FE) MG tablet Take 325 mg by mouth every evening.    Yes [provider]  furosemide (LASIX) 20 MG tablet Take 1 tablet (20 mg total) by mouth daily. 04/12/17  Yes Glade Lloyd, MD  gabapentin (NEURONTIN) 300 MG capsule Take 300 mg by mouth 4 (four)  times daily.    Yes [provider]  ibuprofen (ADVIL,MOTRIN) 200 MG tablet Take 400 mg by mouth every 6 (six) hours as needed for moderate pain.   Yes [provider]  insulin degludec (TRESIBA FLEXTOUCH) 100 UNIT/ML SOPN FlexTouch Pen Inject 40 Units into the skin daily.    Yes Averneni, Carmon Sails, MD  insulin lispro (HUMALOG) 100 UNIT/ML injection Inject 12 units before each meal plus sliding scale   Yes Averneni, Madhavi, MD  lansoprazole (PREVACID) 30 MG capsule Take 30 mg by mouth 2 (two) times daily. 03/19/17  Yes [provider]  metFORMIN (GLUCOPHAGE-XR) 500 MG 24 hr tablet Take 500 mg by mouth 2 (two) times daily.  02/18/18  Yes [provider]  metoprolol tartrate (LOPRESSOR) 25  MG tablet Take 1 tablet (25 mg total) by mouth 2 (two) times daily. 04/11/17  Yes Glade Lloyd, MD  nitroGLYCERIN (NITROSTAT) 0.4 MG SL tablet Place 1 tablet (0.4 mg total) under the tongue every 5 (five) minutes as needed for chest pain. 04/11/17  Yes Glade Lloyd, MD  ondansetron (ZOFRAN ODT) 4 MG disintegrating tablet Take 1 tablet (4 mg total) by mouth every 8 (eight) hours as needed for up to 3 days for nausea or vomiting. 04/22/18 04/25/18 Yes Cardama, Amadeo Garnet, MD  promethazine (PHENERGAN) 25 MG/ML injection Inject 25 mg into the muscle every 6 (six) hours as needed for nausea or vomiting.  04/22/18  Yes [provider]  ranitidine (ZANTAC) 150 MG tablet Take 150 mg by mouth 2 (two) times daily.    Yes [provider]  ranolazine (RANEXA) 500 MG 12 hr tablet Take 1 tablet (500 mg total) by mouth 2 (two) times daily. 04/11/17  Yes Glade Lloyd, MD  traMADol (ULTRAM) 50 MG tablet Take 100 mg by mouth every 6 (six) hours as needed for moderate pain.    Yes [provider]  metoCLOPramide (REGLAN) 5 MG tablet Take 1 tablet (5 mg total) by mouth 3 (three) times daily. Patient not taking: Reported on 04/24/2018 01/02/18 04/22/18  Noralee Stain, DO    [facility] VERIO test strip Use to test blood sugar twice daily 09/24/17   [provider]  promethazine (PHENERGAN) 25 MG suppository Place 1 suppository (25 mg total) rectally every 6 (six) hours as needed for nausea or vomiting. Patient not taking: Reported on 04/22/2018 04/18/18   Long, Arlyss Repress, MD    Family History Family History  Problem Relation Age of Onset  . COPD Mother   . Heart disease Mother   . Lung cancer Mother   . Retinal detachment Father   . Alcoholism Brother   . Heart disease Brother   . COPD Brother   . Diabetes Brother   . Hypertension Brother   . Stroke Brother     Social History Social History   Tobacco Use  . Smoking status: Never Smoker  . Smokeless tobacco: Never Used  Substance Use Topics  . Alcohol use: No    Frequency: Never    Comment: 01/07/2014 'might have a wine cooler a couple times/yr"  . Drug use: No     Allergies   Gabapentin; Nabumetone; Reglan [metoclopramide]; and Codeine   Review of Systems Review of Systems  All other systems reviewed and are negative.    Physical Exam Updated Vital Signs BP (!) 204/91   Pulse 69   Temp 97.9 F (36.6 C) (Oral)   Resp (!) 28   SpO2 100%   Physical Exam  Constitutional: He is oriented to person, place, and time. He appears well-developed and well-nourished. No distress.  Appears like he does not feel well  HENT:  Head: Normocephalic and atraumatic.  Dry mucous membranes  Eyes: Pupils are equal, round, and reactive to light. Conjunctivae and EOM are normal.  Neck: Normal range of motion. Neck supple.  Cardiovascular: Normal rate, regular rhythm and intact distal pulses.  No murmur heard. Pulmonary/Chest: Effort normal and breath sounds normal. No respiratory distress. He has no wheezes. He has no rales.  Abdominal: Soft. He exhibits no distension. There is no tenderness. There is no rebound and no guarding.  Musculoskeletal: Normal range of motion. He exhibits no edema or  tenderness.  Neurological: He is alert and oriented to person, place, and time.  Skin: Skin is warm and dry. No rash noted. No erythema.  Psychiatric: He has a normal mood and affect. His behavior is normal.  Nursing note and vitals reviewed.    ED Treatments / Results  Labs (all labs ordered are listed, but only abnormal results are displayed) Labs Reviewed  COMPREHENSIVE METABOLIC PANEL - Abnormal; Notable for the following components:      Result Value   CO2 21 (*)    Glucose, Bld 230 (*)    All other components within normal limits  BLOOD GAS, VENOUS - Abnormal; Notable for the following components:   pH, Ven 7.569 (*)    pCO2, Ven 25.5 (*)    Acid-Base Excess 2.8 (*)    All other components within normal limits  CBC WITH DIFFERENTIAL/PLATELET  I-STAT TROPONIN, ED    EKG EKG Interpretation  Date/Time:  Thursday April 24 2018 10:31:06 EST Ventricular Rate:  68 PR Interval:    QRS Duration: 85 QT Interval:  420 QTC Calculation: 447 R Axis:   41 Text Interpretation:  Sinus rhythm ST elevation No significant change since last tracing Confirmed by Gwyneth Sprout ([id number]) on 04/24/2018 10:39:11 AM   Radiology No results found.  Procedures Procedures (including critical care time)  Medications Ordered in ED Medications  0.9 %  sodium chloride infusion (has no administration in time range)  sodium chloride 0.9 % bolus 1,000 mL (1,000 mLs Intravenous New Bag/Given 04/24/18 1107)  haloperidol lactate (HALDOL) injection 5 mg (5 mg Intravenous Given 04/24/18 1107)     Initial Impression / Assessment and Plan / ED Course  I have reviewed the triage vital signs and the nursing notes.  Pertinent labs & imaging results that were available during my care of the patient were reviewed by me and considered in my medical decision making (see chart for details).     Elderly male with multiple medical problems presenting with what appears to be gastroparesis flare.  Patient  does have Phenergan orally and IM at home which he tried without improvement.  He was seen 2 days ago for the same symptoms and at that time had reassuring labs.  Patient states he deals with gastroparesis on a daily basis but it is just been worse lately.  He denies infectious symptoms, chest pain, shortness of breath.  He has no abdominal pain on exam.  Patient does not appear to be in DKA today.  VBG reassuring.  Patient's EKG with chronic nonspecific ST elevation which is unchanged from prior EKG and troponin is within normal limits.  Patient was given Haldol for symptoms.  Also IV fluids. BMP without anion gap and otherwise CBC, troponin and CMP are unchanged  12:32 PM Patient on reevaluation states he feels great after the Haldol nurse requesting to go home.  Discussed return precautions.  Patient states he has plenty of medication at home.  Final Clinical Impressions(s) / ED Diagnoses   Final diagnoses:  Gastroparesis    ED Discharge Orders    None       Gwyneth Sprout, MD 04/24/18 1233

## 2018-04-24 NOTE — ED Notes (Signed)
Patient given water

## 2018-04-24 NOTE — ED Notes (Signed)
Patient requesting d/c. MD made aware.

## 2018-04-24 NOTE — ED Notes (Signed)
Bed: WA01 Expected date:  Expected time:  Means of arrival:  Comments: EMS-gastroparesis

## 2018-05-02 ENCOUNTER — Emergency Department (HOSPITAL_COMMUNITY)
Admission: EM | Admit: 2018-05-02 | Discharge: 2018-05-02 | Disposition: A | Discharge status: Home / Self Care | Payer: PPO | Attending: Emergency Medicine | Admitting: Emergency Medicine

## 2018-05-02 ENCOUNTER — Encounter (HOSPITAL_COMMUNITY): Payer: Self-pay | Admitting: Emergency Medicine

## 2018-05-02 DIAGNOSIS — Z9049 Acquired absence of other specified parts of digestive tract: Secondary | ICD-10-CM | POA: Insufficient documentation

## 2018-05-02 DIAGNOSIS — Z955 Presence of coronary angioplasty implant and graft: Secondary | ICD-10-CM | POA: Insufficient documentation

## 2018-05-02 DIAGNOSIS — F419 Anxiety disorder, unspecified: Secondary | ICD-10-CM | POA: Diagnosis not present

## 2018-05-02 DIAGNOSIS — I2510 Atherosclerotic heart disease of native coronary artery without angina pectoris: Secondary | ICD-10-CM | POA: Insufficient documentation

## 2018-05-02 DIAGNOSIS — K3184 Gastroparesis: Secondary | ICD-10-CM

## 2018-05-02 DIAGNOSIS — E119 Type 2 diabetes mellitus without complications: Secondary | ICD-10-CM | POA: Insufficient documentation

## 2018-05-02 DIAGNOSIS — I10 Essential (primary) hypertension: Secondary | ICD-10-CM | POA: Insufficient documentation

## 2018-05-02 DIAGNOSIS — I252 Old myocardial infarction: Secondary | ICD-10-CM | POA: Diagnosis not present

## 2018-05-02 DIAGNOSIS — Z7901 Long term (current) use of anticoagulants: Secondary | ICD-10-CM | POA: Insufficient documentation

## 2018-05-02 DIAGNOSIS — Z79899 Other long term (current) drug therapy: Secondary | ICD-10-CM | POA: Insufficient documentation

## 2018-05-02 DIAGNOSIS — Z7982 Long term (current) use of aspirin: Secondary | ICD-10-CM | POA: Insufficient documentation

## 2018-05-02 DIAGNOSIS — F329 Major depressive disorder, single episode, unspecified: Secondary | ICD-10-CM | POA: Insufficient documentation

## 2018-05-02 DIAGNOSIS — K310 Acute dilatation of stomach: Secondary | ICD-10-CM | POA: Diagnosis present

## 2018-05-02 DIAGNOSIS — Z794 Long term (current) use of insulin: Secondary | ICD-10-CM | POA: Diagnosis not present

## 2018-05-02 DIAGNOSIS — Z7902 Long term (current) use of antithrombotics/antiplatelets: Secondary | ICD-10-CM | POA: Diagnosis not present

## 2018-05-02 LAB — URINALYSIS, ROUTINE W REFLEX MICROSCOPIC
Bacteria, UA: NONE SEEN
Bilirubin Urine: NEGATIVE
HGB URINE DIPSTICK: NEGATIVE
KETONES UR: 20 mg/dL — AB
LEUKOCYTES UA: NEGATIVE
NITRITE: NEGATIVE
PROTEIN: NEGATIVE mg/dL
Specific Gravity, Urine: 1.017 (ref 1.005–1.030)
pH: 6 (ref 5.0–8.0)

## 2018-05-02 LAB — CBC WITH DIFFERENTIAL/PLATELET
ABS IMMATURE GRANULOCYTES: 0.02 10*3/uL (ref 0.00–0.07)
BASOS PCT: 0 %
Basophils Absolute: 0 10*3/uL (ref 0.0–0.1)
Eosinophils Absolute: 0.1 10*3/uL (ref 0.0–0.5)
Eosinophils Relative: 1 %
HCT: 41.7 % (ref 39.0–52.0)
Hemoglobin: 13.6 g/dL (ref 13.0–17.0)
Immature Granulocytes: 0 %
Lymphocytes Relative: 19 %
Lymphs Abs: 1.5 10*3/uL (ref 0.7–4.0)
MCH: 30.4 pg (ref 26.0–34.0)
MCHC: 32.6 g/dL (ref 30.0–36.0)
MCV: 93.3 fL (ref 80.0–100.0)
MONO ABS: 0.5 10*3/uL (ref 0.1–1.0)
MONOS PCT: 7 %
NEUTROS ABS: 5.7 10*3/uL (ref 1.7–7.7)
Neutrophils Relative %: 73 %
PLATELETS: 212 10*3/uL (ref 150–400)
RBC: 4.47 MIL/uL (ref 4.22–5.81)
RDW: 13.2 % (ref 11.5–15.5)
WBC: 7.8 10*3/uL (ref 4.0–10.5)
nRBC: 0 % (ref 0.0–0.2)

## 2018-05-02 LAB — COMPREHENSIVE METABOLIC PANEL
ALK PHOS: 76 U/L (ref 38–126)
ALT: 18 U/L (ref 0–44)
AST: 25 U/L (ref 15–41)
Albumin: 3.5 g/dL (ref 3.5–5.0)
Anion gap: 10 (ref 5–15)
BILIRUBIN TOTAL: 1.1 mg/dL (ref 0.3–1.2)
BUN: 14 mg/dL (ref 8–23)
CALCIUM: 9.2 mg/dL (ref 8.9–10.3)
CO2: 25 mmol/L (ref 22–32)
CREATININE: 0.99 mg/dL (ref 0.61–1.24)
Chloride: 102 mmol/L (ref 98–111)
Glucose, Bld: 285 mg/dL — ABNORMAL HIGH (ref 70–99)
Potassium: 5.1 mmol/L (ref 3.5–5.1)
Sodium: 137 mmol/L (ref 135–145)
TOTAL PROTEIN: 7.2 g/dL (ref 6.5–8.1)

## 2018-05-02 LAB — LIPASE, BLOOD: LIPASE: 17 U/L (ref 11–51)

## 2018-05-02 MED ORDER — SODIUM CHLORIDE 0.9 % IV BOLUS
2000.0000 mL | Freq: Once | INTRAVENOUS | Status: AC
  Administered 2018-05-02: 2000 mL via INTRAVENOUS

## 2018-05-02 MED ORDER — HALOPERIDOL LACTATE 5 MG/ML IJ SOLN: 2.0000 mg | Freq: Once | INTRAMUSCULAR | Status: DC

## 2018-05-02 MED ORDER — PROMETHAZINE HCL 25 MG/ML IJ SOLN
12.5000 mg | Freq: Once | INTRAMUSCULAR | Status: AC
  Administered 2018-05-02: 12.5 mg via INTRAVENOUS
  Filled 2018-05-02: qty 1

## 2018-05-02 MED ORDER — SODIUM CHLORIDE 0.9 % IV SOLN: INTRAVENOUS | Status: DC

## 2018-05-02 MED ORDER — LORAZEPAM 2 MG/ML IJ SOLN
0.5000 mg | Freq: Once | INTRAMUSCULAR | Status: AC
  Administered 2018-05-02: 0.5 mg via INTRAVENOUS
  Filled 2018-05-02: qty 1

## 2018-05-02 NOTE — ED Triage Notes (Signed)
Pt reports that has gastroparesis. Having n/v since around 430 am today. Denies pains, urinary or bowel problems.

## 2018-05-02 NOTE — ED Provider Notes (Signed)
[facility]-EMERGENCY DEPT Provider Note   CSN: [id number] Arrival date & time: 05/02/18  1630     History   Chief Complaint Chief Complaint  Patient presents with  . Emesis    HPI Samuel [name] is a 67 y.o. male.  67 year old male with history of diabetic gastroparesis presents with 1 day history of nonbilious or bloody emesis similar to his prior episodes of gastroparesis.  Denies any fever or chills.  Slight abdominal distention.  Some cramping but no severe pain.  No dysuria or hematuria.  Symptoms unresponsive to home medications and nothing makes them better.     Past Medical History:  Diagnosis Date  . Anxiety   . Arthritis    "all over"   . CAD (coronary artery disease)   . Charcot's joint    "left foot"  . 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 Depression   . Gastroparesis   . GERD (gastroesophageal reflux disease)   . H/O hiatal hernia   . Hyperlipidemia   . Hypertension   . Myocardial infarction (HCC) 2017/03/27   around this date  . OSA (obstructive sleep apnea)    "not bad enough for a mask"  . Peripheral neuropathy   . Peripheral vascular disease (HCC)   . PONV (postoperative nausea and vomiting)   . Pulmonary embolism (HCC)    hx. of 2012  . Shortness of breath    exertion  . Type II diabetes mellitus [facility])     Patient Active Problem List   Diagnosis Date Noted  . DKA, type 2 (HCC) 02/03/2018  . DOE (dyspnea on exertion) 01/24/2018  . Uncontrolled diabetes mellitus (HCC) 09/23/2017  . DKA, type 2, not at goal [facility]) 09/22/2017  . S/P BKA (below knee amputation) (HCC) 04/19/2017  . COPD (chronic obstructive pulmonary disease) (HCC) 04/19/2017  . NSTEMI (non-ST elevated myocardial infarction) (HCC) 04/07/2017  . Osteomyelitis of foot (HCC)   . Protein-calorie malnutrition, severe (HCC) 03/08/2015  . Osteomyelitis of foot, acute (HCC) 03/07/2015  . Diabetes mellitus type 2, uncontrolled (HCC)  03/07/2015  . Normocytic normochromic anemia 03/07/2015  . Obesity (BMI 30-39.9) 03/07/2015  . Nausea & vomiting   . DM (diabetes mellitus), type 2 (HCC) 01/07/2014  . Chronic osteomyelitis involving lower leg (HCC) 01/07/2014  . Vomiting 12/22/2013  . Gastroparesis 12/21/2013  . Intractable nausea and vomiting 12/21/2013  . Essential hypertension 05/01/2013  . Hyperlipidemia due to type 2 diabetes mellitus (HCC) 05/01/2013  . Generalized weakness 03/20/2013  . Back pain 03/20/2013  . Pulmonary embolism (HCC) 03/27/2011  . PERFORATION OF GALLBLADDER 08/30/2010  . Ulcer of lower limb, unspecified 03/01/2010  . METHICILLIN SUSCEPTIBLE STAPH AUREUS SEPTICEMIA 01/25/2010  . Acute osteomyelitis, ankle and foot 01/25/2010  . NAUSEA AND VOMITING 01/25/2010  . GERD 11/09/2009  . CAD S/P DES PCI to mLAD: Xience DES 2.75 x 15 (3.0 mm) 05/01/2009  . DIABETES, TYPE 1 05/14/2008  . OBSTRUCTIVE SLEEP APNEA 04/30/2008  . ALLERGIC RHINITIS, SEASONAL 04/30/2008    Past Surgical History:  Procedure Laterality Date  . AMPUTATION Left 03/08/2015   Procedure:  LEFT AMPUTATION BELOW KNEE;  Surgeon: Toni Arthurs, MD;  Location: [facility] ORS;  Service: Orthopedics;  Laterality: Left;  . APPLICATION OF WOUND VAC Left 01/07/2014  . CHOLECYSTECTOMY  06/2010  . CORONARY ANGIOPLASTY WITH STENT PLACEMENT  05/2009   "1"  . FOOT SURGERY Left 2010   "for Charcot's joint"  . HARDWARE REMOVAL Left 01/07/2014  Procedure: LEFT LEG REMOVAL OF DEEP IMPLANT AND SEQUESTRECTOMY; APPLICATION OF WOUND VAC ;  Surgeon: Toni ArthursJohn Hewitt, MD;  Location: [facility] OR;  Service: Orthopedics;  Laterality: Left;  . I&D EXTREMITY Left "multiple"   leg  . I&D EXTREMITY Left 01/07/2014   Procedure: 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;  Surgeon: Toni ArthursJohn Hewitt, MD;  Location: [facility] OR;  Service: Orthopedics;  Laterality: Left;  . IM NAILING TIBIA Left ~ 2012  . LEFT HEART CATH AND CORONARY ANGIOGRAPHY N/A 04/08/2017   Procedure: LEFT HEART  CATH AND CORONARY ANGIOGRAPHY;  Surgeon: Runell GessBerry, Jonathan J, MD;  Location: [facility] INVASIVE CV LAB;  Service: Cardiovascular;  Laterality: N/A;  . SHOULDER ARTHROSCOPY W/ ROTATOR CUFF REPAIR Left    "and bone spurs"  . TIBIAL IM ROD REMOVAL Left 01/07/2014  . TOE AMPUTATION Right ~ 2011   "great toe"  . VENA CAVA FILTER PLACEMENT  2012  . VENA CAVA FILTER PLACEMENT N/A 11/20/2016   Procedure: INSERTION VENA-CAVA FILTER;  Surgeon: Sherren KernsFields, Charles E, MD;  [facility];  Service: Vascular;  Laterality: N/A;  . WOUND DEBRIDEMENT Left 01/07/2014   "tibia"        Home Medications    Prior to Admission medications   Medication Sig Start Date End Date Taking? Authorizing Provider  amLODipine (NORVASC) 10 MG tablet Take 1 tablet (10 mg total) by mouth daily. 11/22/16  Yes Richarda OverlieAbrol, Nayana, MD  apixaban (ELIQUIS) 5 MG TABS tablet Take 1 tablet (5 mg total) by mouth 2 (two) times daily. 09/24/17  Yes Osvaldo ShipperKrishnan, Gokul, MD  aspirin 81 MG EC tablet Take 1 tablet (81 mg total) by mouth daily. 04/12/17  Yes Glade LloydAlekh, Kshitiz, MD  atorvastatin (LIPITOR) 80 MG tablet Take 1 tablet (80 mg total) by mouth daily at 6 PM. 04/11/17  Yes Hanley BenAlekh, Kshitiz, MD  clopidogrel (PLAVIX) 75 MG tablet Take 1 tablet (75 mg total) by mouth daily. 04/12/17  Yes Glade LloydAlekh, Kshitiz, MD  ferrous sulfate 325 (65 FE) MG tablet Take 325 mg by mouth every evening.    Yes [provider]  furosemide (LASIX) 20 MG tablet Take 1 tablet (20 mg total) by mouth daily. 04/12/17  Yes Glade LloydAlekh, Kshitiz, MD  gabapentin (NEURONTIN) 300 MG capsule Take 300 mg by mouth 4 (four) times daily.    Yes [provider]  ibuprofen (ADVIL,MOTRIN) 200 MG tablet Take 400 mg by mouth every 6 (six) hours as needed for moderate pain.   Yes [provider]  insulin degludec (TRESIBA FLEXTOUCH) 100 UNIT/ML SOPN FlexTouch Pen Inject 40 Units into the skin daily.    Yes Averneni, Carmon SailsMadhavi, MD  insulin lispro (HUMALOG) 100 UNIT/ML injection Inject 12 units  before each meal plus sliding scale   Yes Averneni, Madhavi, MD  lansoprazole (PREVACID) 30 MG capsule Take 30 mg by mouth 2 (two) times daily. 03/19/17  Yes [provider]  metFORMIN (GLUCOPHAGE-XR) 500 MG 24 hr tablet Take 500 mg by mouth 2 (two) times daily.  02/18/18  Yes [provider]  metoprolol tartrate (LOPRESSOR) 25 MG tablet Take 1 tablet (25 mg total) by mouth 2 (two) times daily. 04/11/17  Yes Glade LloydAlekh, Kshitiz, MD  promethazine (PHENERGAN) 25 MG/ML injection Inject 25 mg into the muscle every 6 (six) hours as needed for nausea or vomiting.  04/22/18  Yes [provider]  ranitidine (ZANTAC) 150 MG tablet Take 150 mg by mouth 2 (two) times daily.    Yes [provider]  ranolazine (RANEXA) 500 MG 12  hr tablet Take 1 tablet (500 mg total) by mouth 2 (two) times daily. 04/11/17  Yes Glade Lloyd, MD  traMADol (ULTRAM) 50 MG tablet Take 100 mg by mouth every 6 (six) hours as needed for moderate pain.    Yes [provider]  metoCLOPramide (REGLAN) 5 MG tablet Take 1 tablet (5 mg total) by mouth 3 (three) times daily. Patient not taking: Reported on 04/24/2018 01/02/18 04/22/18  Noralee Stain, DO  nitroGLYCERIN (NITROSTAT) 0.4 MG SL tablet Place 1 tablet (0.4 mg total) under the tongue every 5 (five) minutes as needed for chest pain. 04/11/17   Glade Lloyd, MD  [facility] VERIO test strip Use to test blood sugar twice daily 09/24/17   [provider]  promethazine (PHENERGAN) 25 MG suppository Place 1 suppository (25 mg total) rectally every 6 (six) hours as needed for nausea or vomiting. Patient not taking: Reported on 04/22/2018 04/18/18   Long, Arlyss Repress, MD    Family History Family History  Problem Relation Age of Onset  . COPD Mother   . Heart disease Mother   . Lung cancer Mother   . Retinal detachment Father   . Alcoholism Brother   . Heart disease Brother   . COPD Brother   . Diabetes Brother   . Hypertension Brother   . Stroke  Brother     Social History Social History   Tobacco Use  . Smoking status: Never Smoker  . Smokeless tobacco: Never Used  Substance Use Topics  . Alcohol use: No    Frequency: Never    Comment: 01/07/2014 'might have a wine cooler a couple times/yr"  . Drug use: No     Allergies   Gabapentin; Nabumetone; Reglan [metoclopramide]; and Codeine   Review of Systems Review of Systems  All other systems reviewed and are negative.    Physical Exam Updated Vital Signs BP (!) 159/84 (BP Location: Right Arm)   Pulse (!) 123   Temp 97.9 F (36.6 C) (Oral)   Resp 20   Ht 1.905 m (6\' 3" )   Wt 102.5 kg   SpO2 98%   BMI 28.25 kg/m   Physical Exam  Constitutional: He is oriented to person, place, and time. He appears well-developed and well-nourished.  Non-toxic appearance. No distress.  HENT:  Head: Normocephalic and atraumatic.  Eyes: Pupils are equal, round, and reactive to light. Conjunctivae, EOM and lids are normal.  Neck: Normal range of motion. Neck supple. No tracheal deviation present. No thyroid mass present.  Cardiovascular: Normal rate, regular rhythm and normal heart sounds. Exam reveals no gallop.  No murmur heard. Pulmonary/Chest: Effort normal and breath sounds normal. No stridor. No respiratory distress. He has no decreased breath sounds. He has no wheezes. He has no rhonchi. He has no rales.  Abdominal: Soft. Normal appearance and bowel sounds are normal. He exhibits no distension. There is no tenderness. There is no rigidity, no rebound, no guarding and no CVA tenderness.  Musculoskeletal: Normal range of motion. He exhibits no edema or tenderness.  Neurological: He is alert and oriented to person, place, and time. He has normal strength. No cranial nerve deficit or sensory deficit. GCS eye subscore is 4. GCS verbal subscore is 5. GCS motor subscore is 6.  Skin: Skin is warm and dry. No abrasion and no rash noted.  Psychiatric: He has a normal mood and affect.  His speech is normal and behavior is normal.  Nursing note and vitals reviewed.    ED Treatments /  Results  Labs (all labs ordered are listed, but only abnormal results are displayed) Labs Reviewed  CBC WITH DIFFERENTIAL/PLATELET  COMPREHENSIVE METABOLIC PANEL  LIPASE, BLOOD  URINALYSIS, ROUTINE W REFLEX MICROSCOPIC    EKG None  Radiology No results found.  Procedures Procedures (including critical care time)  Medications Ordered in ED Medications  sodium chloride 0.9 % bolus 2,000 mL (has no administration in time range)  0.9 %  sodium chloride infusion (has no administration in time range)  LORazepam (ATIVAN) injection 0.5 mg (has no administration in time range)  promethazine (PHENERGAN) injection 25 mg (has no administration in time range)     Initial Impression / Assessment and Plan / ED Course  I have reviewed the triage vital signs and the nursing notes.  Pertinent labs & imaging results that were available during my care of the patient were reviewed by me and considered in my medical decision making (see chart for details).    Patient given IV fluids here as well as antiemetics and feels better.  Repeat abdominal exam at time of discharge remained stable.  Patient feels comfortable to go home  Final Clinical Impressions(s) / ED Diagnoses   Final diagnoses:  None    ED Discharge Orders    None       Lorre Nick, MD 05/02/18 2021

## 2018-05-06 DIAGNOSIS — Z794 Long term (current) use of insulin: Secondary | ICD-10-CM | POA: Diagnosis not present

## 2018-05-06 DIAGNOSIS — I10 Essential (primary) hypertension: Secondary | ICD-10-CM | POA: Diagnosis not present

## 2018-05-06 DIAGNOSIS — I2510 Atherosclerotic heart disease of native coronary artery without angina pectoris: Secondary | ICD-10-CM | POA: Diagnosis not present

## 2018-05-06 DIAGNOSIS — E1165 Type 2 diabetes mellitus with hyperglycemia: Secondary | ICD-10-CM | POA: Diagnosis not present

## 2018-05-06 DIAGNOSIS — S88112A Complete traumatic amputation at level between knee and ankle, left lower leg, initial encounter: Secondary | ICD-10-CM | POA: Diagnosis not present

## 2018-05-06 DIAGNOSIS — E1140 Type 2 diabetes mellitus with diabetic neuropathy, unspecified: Secondary | ICD-10-CM | POA: Diagnosis not present

## 2018-05-09 DIAGNOSIS — H25013 Cortical age-related cataract, bilateral: Secondary | ICD-10-CM | POA: Diagnosis not present

## 2018-05-09 DIAGNOSIS — E119 Type 2 diabetes mellitus without complications: Secondary | ICD-10-CM | POA: Diagnosis not present

## 2018-05-09 DIAGNOSIS — H35033 Hypertensive retinopathy, bilateral: Secondary | ICD-10-CM | POA: Diagnosis not present

## 2018-05-09 DIAGNOSIS — H2513 Age-related nuclear cataract, bilateral: Secondary | ICD-10-CM | POA: Diagnosis not present

## 2018-05-09 DIAGNOSIS — H5703 Miosis: Secondary | ICD-10-CM | POA: Diagnosis not present

## 2018-05-12 ENCOUNTER — Emergency Department (HOSPITAL_COMMUNITY): Admission: EM | Admit: 2018-05-12 | Discharge: 2018-05-12 | Discharge status: Against Medical Advice | Payer: PPO

## 2018-05-19 ENCOUNTER — Encounter (INDEPENDENT_AMBULATORY_CARE_PROVIDER_SITE_OTHER): Payer: PPO | Admitting: Ophthalmology

## 2018-05-19 DIAGNOSIS — H33301 Unspecified retinal break, right eye: Secondary | ICD-10-CM

## 2018-05-19 DIAGNOSIS — E11319 Type 2 diabetes mellitus with unspecified diabetic retinopathy without macular edema: Secondary | ICD-10-CM

## 2018-05-19 DIAGNOSIS — H43813 Vitreous degeneration, bilateral: Secondary | ICD-10-CM | POA: Diagnosis not present

## 2018-05-19 DIAGNOSIS — E113392 Type 2 diabetes mellitus with moderate nonproliferative diabetic retinopathy without macular edema, left eye: Secondary | ICD-10-CM | POA: Diagnosis not present

## 2018-05-19 DIAGNOSIS — I10 Essential (primary) hypertension: Secondary | ICD-10-CM

## 2018-05-19 DIAGNOSIS — H35033 Hypertensive retinopathy, bilateral: Secondary | ICD-10-CM

## 2018-05-19 DIAGNOSIS — E113291 Type 2 diabetes mellitus with mild nonproliferative diabetic retinopathy without macular edema, right eye: Secondary | ICD-10-CM | POA: Diagnosis not present

## 2018-05-19 DIAGNOSIS — H2513 Age-related nuclear cataract, bilateral: Secondary | ICD-10-CM | POA: Diagnosis not present

## 2018-05-20 ENCOUNTER — Emergency Department (HOSPITAL_COMMUNITY)
Admission: EM | Admit: 2018-05-20 | Discharge: 2018-05-20 | Disposition: A | Discharge status: Home / Self Care | Payer: PPO | Attending: Emergency Medicine | Admitting: Emergency Medicine

## 2018-05-20 ENCOUNTER — Encounter (HOSPITAL_COMMUNITY): Payer: Self-pay | Admitting: *Deleted

## 2018-05-20 ENCOUNTER — Other Ambulatory Visit: Payer: Self-pay

## 2018-05-20 DIAGNOSIS — I2510 Atherosclerotic heart disease of native coronary artery without angina pectoris: Secondary | ICD-10-CM | POA: Insufficient documentation

## 2018-05-20 DIAGNOSIS — Z7982 Long term (current) use of aspirin: Secondary | ICD-10-CM | POA: Insufficient documentation

## 2018-05-20 DIAGNOSIS — E1140 Type 2 diabetes mellitus with diabetic neuropathy, unspecified: Secondary | ICD-10-CM | POA: Diagnosis not present

## 2018-05-20 DIAGNOSIS — I10 Essential (primary) hypertension: Secondary | ICD-10-CM | POA: Insufficient documentation

## 2018-05-20 DIAGNOSIS — Z794 Long term (current) use of insulin: Secondary | ICD-10-CM | POA: Diagnosis not present

## 2018-05-20 DIAGNOSIS — R110 Nausea: Secondary | ICD-10-CM | POA: Diagnosis not present

## 2018-05-20 DIAGNOSIS — Z79899 Other long term (current) drug therapy: Secondary | ICD-10-CM | POA: Diagnosis not present

## 2018-05-20 DIAGNOSIS — R112 Nausea with vomiting, unspecified: Secondary | ICD-10-CM | POA: Diagnosis not present

## 2018-05-20 DIAGNOSIS — R1111 Vomiting without nausea: Secondary | ICD-10-CM | POA: Diagnosis not present

## 2018-05-20 LAB — URINALYSIS, ROUTINE W REFLEX MICROSCOPIC
Bacteria, UA: NONE SEEN
Bilirubin Urine: NEGATIVE
Glucose, UA: >=500 mg/dL — AB
Hgb urine dipstick: NEGATIVE
Ketones, ur: 5 mg/dL — AB
Leukocytes, UA: NEGATIVE
Nitrite: NEGATIVE
Protein, ur: NEGATIVE mg/dL
Specific Gravity, Urine: 1.013 (ref 1.005–1.030)
pH: 5 (ref 5.0–8.0)

## 2018-05-20 LAB — CBC
HCT: 44.6 % (ref 39.0–52.0)
Hemoglobin: 15 g/dL (ref 13.0–17.0)
MCH: 30.9 pg (ref 26.0–34.0)
MCHC: 33.6 g/dL (ref 30.0–36.0)
MCV: 91.8 fL (ref 80.0–100.0)
Platelets: 261 10*3/uL (ref 150–400)
RBC: 4.86 MIL/uL (ref 4.22–5.81)
RDW: 12.7 % (ref 11.5–15.5)
WBC: 8.5 10*3/uL (ref 4.0–10.5)
nRBC: 0 % (ref 0.0–0.2)

## 2018-05-20 LAB — COMPREHENSIVE METABOLIC PANEL
ALT: 18 U/L (ref 0–44)
AST: 21 U/L (ref 15–41)
Albumin: 3.9 g/dL (ref 3.5–5.0)
Alkaline Phosphatase: 76 U/L (ref 38–126)
Anion gap: 11 (ref 5–15)
BUN: 21 mg/dL (ref 8–23)
CO2: 26 mmol/L (ref 22–32)
Calcium: 9.7 mg/dL (ref 8.9–10.3)
Chloride: 99 mmol/L (ref 98–111)
Creatinine, Ser: 1.07 mg/dL (ref 0.61–1.24)
GFR calc Af Amer: >60 mL/min (ref >60)
GFR calc non Af Amer: >60 mL/min (ref >60)
Glucose, Bld: 261 mg/dL — ABNORMAL HIGH (ref 70–99)
Potassium: 4.9 mmol/L (ref 3.5–5.1)
Sodium: 136 mmol/L (ref 135–145)
Total Bilirubin: 0.8 mg/dL (ref 0.3–1.2)
Total Protein: 7.4 g/dL (ref 6.5–8.1)

## 2018-05-20 LAB — LIPASE, BLOOD: Lipase: 21 U/L (ref 11–51)

## 2018-05-20 MED ORDER — LORAZEPAM 2 MG/ML IJ SOLN
0.5000 mg | Freq: Once | INTRAMUSCULAR | Status: AC
  Administered 2018-05-20: 0.5 mg via INTRAVENOUS
  Filled 2018-05-20: qty 1

## 2018-05-20 MED ORDER — SODIUM CHLORIDE 0.9 % IV BOLUS
1000.0000 mL | Freq: Once | INTRAVENOUS | Status: AC
  Administered 2018-05-20: 1000 mL via INTRAVENOUS

## 2018-05-20 MED ORDER — PROMETHAZINE HCL 25 MG/ML IJ SOLN
12.5000 mg | Freq: Once | INTRAMUSCULAR | Status: AC
  Administered 2018-05-20: 12.5 mg via INTRAVENOUS
  Filled 2018-05-20: qty 1

## 2018-05-20 NOTE — ED Notes (Signed)
Urine Culture sent downstairs with UA

## 2018-05-20 NOTE — ED Triage Notes (Signed)
EMS states pt started to vomit about 0600 this am, he has gastroparesis and is frequently here for same.

## 2018-05-21 NOTE — ED Provider Notes (Signed)
[facility]-EMERGENCY DEPT Provider Note   CSN: [id number] Arrival date & time: 05/20/18  1018     History   Chief Complaint Chief Complaint  Patient presents with  . Nausea  . Emesis    HPI Samuel BusingJohn D [name] is a 67 y.o. male.  HPI   67 year old male with nausea and vomiting.  Onset around 6:00 this morning.  Persistent since then.  Denies any pain.  No fevers or chills.  Reports a past history of gastroparesis and states that current symptoms feel similar.  No fevers or chills.  No acute urinary complaints.  No diarrhea.  Past Medical History:  Diagnosis Date  . Anxiety   . Arthritis    "all over"   . CAD (coronary artery disease)   . Charcot's joint    "left foot"  . 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 Depression   . Gastroparesis   . GERD (gastroesophageal reflux disease)   . H/O hiatal hernia   . Hyperlipidemia   . Hypertension   . Myocardial infarction (HCC) 2017/03/27   around this date  . OSA (obstructive sleep apnea)    "not bad enough for a mask"  . Peripheral neuropathy   . Peripheral vascular disease (HCC)   . PONV (postoperative nausea and vomiting)   . Pulmonary embolism (HCC)    hx. of 2012  . Shortness of breath    exertion  . Type II diabetes mellitus [facility](HCC)     Patient Active Problem List   Diagnosis Date Noted  . DKA, type 2 (HCC) 02/03/2018  . DOE (dyspnea on exertion) 01/24/2018  . Uncontrolled diabetes mellitus (HCC) 09/23/2017  . DKA, type 2, not at goal [facility](HCC) 09/22/2017  . S/P BKA (below knee amputation) (HCC) 04/19/2017  . COPD (chronic obstructive pulmonary disease) (HCC) 04/19/2017  . NSTEMI (non-ST elevated myocardial infarction) (HCC) 04/07/2017  . Osteomyelitis of foot (HCC)   . Protein-calorie malnutrition, severe (HCC) 03/08/2015  . Osteomyelitis of foot, acute (HCC) 03/07/2015  . Diabetes mellitus type 2, uncontrolled (HCC) 03/07/2015  . Normocytic normochromic anemia 03/07/2015    . Obesity (BMI 30-39.9) 03/07/2015  . Nausea & vomiting   . DM (diabetes mellitus), type 2 (HCC) 01/07/2014  . Chronic osteomyelitis involving lower leg (HCC) 01/07/2014  . Vomiting 12/22/2013  . Gastroparesis 12/21/2013  . Intractable nausea and vomiting 12/21/2013  . Essential hypertension 05/01/2013  . Hyperlipidemia due to type 2 diabetes mellitus (HCC) 05/01/2013  . Generalized weakness 03/20/2013  . Back pain 03/20/2013  . Pulmonary embolism (HCC) 03/27/2011  . PERFORATION OF GALLBLADDER 08/30/2010  . Ulcer of lower limb, unspecified 03/01/2010  . METHICILLIN SUSCEPTIBLE STAPH AUREUS SEPTICEMIA 01/25/2010  . Acute osteomyelitis, ankle and foot 01/25/2010  . NAUSEA AND VOMITING 01/25/2010  . GERD 11/09/2009  . CAD S/P DES PCI to mLAD: Xience DES 2.75 x 15 (3.0 mm) 05/01/2009  . DIABETES, TYPE 1 05/14/2008  . OBSTRUCTIVE SLEEP APNEA 04/30/2008  . ALLERGIC RHINITIS, SEASONAL 04/30/2008    Past Surgical History:  Procedure Laterality Date  . AMPUTATION Left 03/08/2015   Procedure:  LEFT AMPUTATION BELOW KNEE;  Surgeon: Toni ArthursJohn Hewitt, MD;  Location: [facility] ORS;  Service: Orthopedics;  Laterality: Left;  . APPLICATION OF WOUND VAC Left 01/07/2014  . CHOLECYSTECTOMY  06/2010  . CORONARY ANGIOPLASTY WITH STENT PLACEMENT  05/2009   "1"  . FOOT SURGERY Left 2010   "for Charcot's joint"  . HARDWARE REMOVAL Left 01/07/2014   Procedure:  LEFT LEG REMOVAL OF DEEP IMPLANT AND SEQUESTRECTOMY; APPLICATION OF WOUND VAC ;  Surgeon: Toni Arthurs, MD;  Location: [facility] OR;  Service: Orthopedics;  Laterality: Left;  . I&D EXTREMITY Left "multiple"   leg  . 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 IM NAILING TIBIA Left ~ 2012  . LEFT HEART CATH AND CORONARY ANGIOGRAPHY N/A 04/08/2017   Procedure: LEFT HEART CATH AND CORONARY ANGIOGRAPHY;  Surgeon: Runell Gess, MD;  Location: [facility] INVASIVE CV LAB;  Service: Cardiovascular;  Laterality: N/A;  . SHOULDER ARTHROSCOPY W/ ROTATOR CUFF REPAIR Left    "and bone spurs"  . TIBIAL IM ROD REMOVAL Left 01/07/2014  . TOE AMPUTATION Right ~ 2011   "great toe"  . VENA CAVA FILTER PLACEMENT  2012  . VENA CAVA FILTER PLACEMENT N/A 11/20/2016   Procedure: INSERTION VENA-CAVA FILTER;  Surgeon: Sherren Kerns, MD;  Location: [facility] OR;  Service: Vascular;  Laterality: N/A;  . WOUND DEBRIDEMENT Left 01/07/2014   "tibia"        Home Medications    Prior to Admission medications   Medication Sig Start Date End Date Taking? Authorizing Provider  amLODipine (NORVASC) 10 MG tablet Take 1 tablet (10 mg total) by mouth daily. 11/22/16   Richarda Overlie, MD  apixaban (ELIQUIS) 5 MG TABS tablet Take 1 tablet (5 mg total) by mouth 2 (two) times daily. 09/24/17   Osvaldo Shipper, MD  aspirin 81 MG EC tablet Take 1 tablet (81 mg total) by mouth daily. 04/12/17   Glade Lloyd, MD  atorvastatin (LIPITOR) 80 MG tablet Take 1 tablet (80 mg total) by mouth daily at 6 PM. 04/11/17   Glade Lloyd, MD  clopidogrel (PLAVIX) 75 MG tablet Take 1 tablet (75 mg total) by mouth daily. 04/12/17   Glade Lloyd, MD  ferrous sulfate 325 (65 FE) MG tablet Take 325 mg by mouth every evening.     [provider]  furosemide (LASIX) 20 MG tablet Take 1 tablet (20 mg total) by mouth daily. 04/12/17   Glade Lloyd, MD  gabapentin (NEURONTIN) 300 MG capsule Take 300 mg by mouth 4 (four) times daily.     [provider]  ibuprofen (ADVIL,MOTRIN) 200 MG tablet Take 400 mg by mouth every 6 (six) hours as needed for moderate pain.    [provider]  insulin degludec (TRESIBA FLEXTOUCH) 100 UNIT/ML SOPN FlexTouch Pen Inject 40 Units into the skin daily.     Marlene Lard, MD  insulin lispro (HUMALOG) 100 UNIT/ML injection Inject 12 units before each meal plus sliding scale    Averneni, Madhavi, MD  lansoprazole (PREVACID) 30  MG capsule Take 30 mg by mouth 2 (two) times daily. 03/19/17   [provider]  metFORMIN (GLUCOPHAGE-XR) 500 MG 24 hr tablet Take 500 mg by mouth 2 (two) times daily.  02/18/18   [provider]  metoCLOPramide (REGLAN) 5 MG tablet Take 1 tablet (5 mg total) by mouth 3 (three) times daily. Patient not taking: Reported on 04/24/2018 01/02/18 04/22/18  Noralee Stain, DO  metoprolol tartrate (LOPRESSOR) 25 MG tablet Take 1 tablet (25 mg total) by mouth 2 (two) times daily. 04/11/17   Glade Lloyd, MD  nitroGLYCERIN (NITROSTAT) 0.4 MG SL tablet Place 1 tablet (0.4 mg total) under the tongue every 5 (five) minutes as needed for chest pain. 04/11/17  Glade Lloyd, MD  [facility] VERIO test strip Use to test blood sugar twice daily 09/24/17   [provider]  promethazine (PHENERGAN) 25 MG suppository Place 1 suppository (25 mg total) rectally every 6 (six) hours as needed for nausea or vomiting. Patient not taking: Reported on 04/22/2018 04/18/18   Long, Arlyss Repress, MD  promethazine (PHENERGAN) 25 MG/ML injection Inject 25 mg into the muscle every 6 (six) hours as needed for nausea or vomiting.  04/22/18   [provider]  ranitidine (ZANTAC) 150 MG tablet Take 150 mg by mouth 2 (two) times daily.     [provider]  ranolazine (RANEXA) 500 MG 12 hr tablet Take 1 tablet (500 mg total) by mouth 2 (two) times daily. 04/11/17   Glade Lloyd, MD  traMADol (ULTRAM) 50 MG tablet Take 100 mg by mouth every 6 (six) hours as needed for moderate pain.     [provider]    Family History Family History  Problem Relation Age of Onset  . COPD Mother   . Heart disease Mother   . Lung cancer Mother   . Retinal detachment Father   . Alcoholism Brother   . Heart disease Brother   . COPD Brother   . Diabetes Brother   . Hypertension Brother   . Stroke Brother     Social History Social History   Tobacco Use  . Smoking status: Never Smoker  . Smokeless  tobacco: Never Used  Substance Use Topics  . Alcohol use: No    Frequency: Never    Comment: 01/07/2014 'might have a wine cooler a couple times/yr"  . Drug use: No     Allergies   Gabapentin; Nabumetone; Reglan [metoclopramide]; and Codeine   Review of Systems Review of Systems  All systems reviewed and negative, other than as noted in HPI.  Physical Exam Updated Vital Signs BP (!) 149/80   Pulse 83   Temp 98 F (36.7 C) (Oral)   Resp 18   Ht 6\' 3"  (1.905 m)   Wt 102.5 kg   SpO2 94%   BMI 28.24 kg/m   Physical Exam  Constitutional: He appears well-developed and well-nourished. No distress.  HENT:  Head: Normocephalic and atraumatic.  Eyes: Conjunctivae are normal. Right eye exhibits no discharge. Left eye exhibits no discharge.  Neck: Neck supple.  Cardiovascular: Normal rate, regular rhythm and normal heart sounds. Exam reveals no gallop and no friction rub.  No murmur heard. Pulmonary/Chest: Effort normal and breath sounds normal. No respiratory distress.  Abdominal: Soft. He exhibits no distension. There is no tenderness.  Musculoskeletal: He exhibits no edema or tenderness.  Neurological: He is alert.  Skin: Skin is warm and dry.  Psychiatric: He has a normal mood and affect. His behavior is normal. Thought content normal.  Nursing note and vitals reviewed.    ED Treatments / Results  Labs (all labs ordered are listed, but only abnormal results are displayed) Labs Reviewed  COMPREHENSIVE METABOLIC PANEL - Abnormal; Notable for the following components:      Result Value   Glucose, Bld 261 (*)    All other components within normal limits  URINALYSIS, ROUTINE W REFLEX MICROSCOPIC - Abnormal; Notable for the following components:   Glucose, UA >=500 (*)    Ketones, ur 5 (*)    All other components within normal limits  LIPASE, BLOOD  CBC    EKG None  Radiology No results found.  Procedures Procedures (including critical care  time)  Medications Ordered in ED Medications  sodium chloride 0.9 % bolus 1,000 mL (0 mLs Intravenous Stopped 05/20/18 1259)  LORazepam (ATIVAN) injection 0.5 mg (0.5 mg Intravenous Given 05/20/18 1202)  promethazine (PHENERGAN) injection 12.5 mg (12.5 mg Intravenous Given 05/20/18 1202)  promethazine (PHENERGAN) injection 12.5 mg (12.5 mg Intravenous Given 05/20/18 1339)     Initial Impression / Assessment and Plan / ED Course  I have reviewed the triage vital signs and the nursing notes.  Pertinent labs & imaging results that were available during my care of the patient were reviewed by me and considered in my medical decision making (see chart for details).     67 year old male with nausea and vomiting.  History of gastroparesis and frequent evaluations for similar symptoms.  Is treated symptomatically with much improvement.  He feels comfortable for discharge.  ED work-up fairly unremarkable.  Abdominal exam benign.  Final Clinical Impressions(s) / ED Diagnoses   Final diagnoses:  Nausea and vomiting, intractability of vomiting not specified, unspecified vomiting type    ED Discharge Orders    None       Raeford Razor, MD 05/21/18 [phone]

## 2018-05-29 DIAGNOSIS — E113392 Type 2 diabetes mellitus with moderate nonproliferative diabetic retinopathy without macular edema, left eye: Secondary | ICD-10-CM | POA: Diagnosis not present

## 2018-05-29 DIAGNOSIS — H2513 Age-related nuclear cataract, bilateral: Secondary | ICD-10-CM | POA: Diagnosis not present

## 2018-05-29 DIAGNOSIS — E113291 Type 2 diabetes mellitus with mild nonproliferative diabetic retinopathy without macular edema, right eye: Secondary | ICD-10-CM | POA: Diagnosis not present

## 2018-05-29 DIAGNOSIS — H25011 Cortical age-related cataract, right eye: Secondary | ICD-10-CM | POA: Diagnosis not present

## 2018-05-29 DIAGNOSIS — H35439 Paving stone degeneration of retina, unspecified eye: Secondary | ICD-10-CM | POA: Diagnosis not present

## 2018-05-29 DIAGNOSIS — H25013 Cortical age-related cataract, bilateral: Secondary | ICD-10-CM | POA: Diagnosis not present

## 2018-05-29 DIAGNOSIS — H35033 Hypertensive retinopathy, bilateral: Secondary | ICD-10-CM | POA: Diagnosis not present

## 2018-05-29 DIAGNOSIS — H2511 Age-related nuclear cataract, right eye: Secondary | ICD-10-CM | POA: Diagnosis not present

## 2018-06-01 ENCOUNTER — Emergency Department (HOSPITAL_COMMUNITY)
Admission: EM | Admit: 2018-06-01 | Discharge: 2018-06-01 | Disposition: A | Discharge status: Home / Self Care | Payer: PPO | Attending: Emergency Medicine | Admitting: Emergency Medicine

## 2018-06-01 ENCOUNTER — Encounter (HOSPITAL_COMMUNITY): Payer: Self-pay

## 2018-06-01 DIAGNOSIS — R110 Nausea: Secondary | ICD-10-CM | POA: Diagnosis not present

## 2018-06-01 DIAGNOSIS — Z7902 Long term (current) use of antithrombotics/antiplatelets: Secondary | ICD-10-CM | POA: Diagnosis not present

## 2018-06-01 DIAGNOSIS — R52 Pain, unspecified: Secondary | ICD-10-CM | POA: Diagnosis not present

## 2018-06-01 DIAGNOSIS — Z7982 Long term (current) use of aspirin: Secondary | ICD-10-CM | POA: Insufficient documentation

## 2018-06-01 DIAGNOSIS — R112 Nausea with vomiting, unspecified: Secondary | ICD-10-CM | POA: Diagnosis not present

## 2018-06-01 DIAGNOSIS — Z79899 Other long term (current) drug therapy: Secondary | ICD-10-CM | POA: Diagnosis not present

## 2018-06-01 DIAGNOSIS — R29898 Other symptoms and signs involving the musculoskeletal system: Secondary | ICD-10-CM | POA: Diagnosis not present

## 2018-06-01 DIAGNOSIS — Z7984 Long term (current) use of oral hypoglycemic drugs: Secondary | ICD-10-CM | POA: Insufficient documentation

## 2018-06-01 DIAGNOSIS — I10 Essential (primary) hypertension: Secondary | ICD-10-CM | POA: Diagnosis not present

## 2018-06-01 DIAGNOSIS — I252 Old myocardial infarction: Secondary | ICD-10-CM | POA: Diagnosis not present

## 2018-06-01 DIAGNOSIS — I2510 Atherosclerotic heart disease of native coronary artery without angina pectoris: Secondary | ICD-10-CM | POA: Insufficient documentation

## 2018-06-01 DIAGNOSIS — Z7901 Long term (current) use of anticoagulants: Secondary | ICD-10-CM | POA: Diagnosis not present

## 2018-06-01 DIAGNOSIS — E119 Type 2 diabetes mellitus without complications: Secondary | ICD-10-CM | POA: Diagnosis not present

## 2018-06-01 LAB — CBC
HEMATOCRIT: 45.3 % (ref 39.0–52.0)
Hemoglobin: 14.9 g/dL (ref 13.0–17.0)
MCH: 30.2 pg (ref 26.0–34.0)
MCHC: 32.9 g/dL (ref 30.0–36.0)
MCV: 91.9 fL (ref 80.0–100.0)
PLATELETS: 272 10*3/uL (ref 150–400)
RBC: 4.93 MIL/uL (ref 4.22–5.81)
RDW: 12.6 % (ref 11.5–15.5)
WBC: 8.1 10*3/uL (ref 4.0–10.5)
nRBC: 0 % (ref 0.0–0.2)

## 2018-06-01 LAB — COMPREHENSIVE METABOLIC PANEL
ALBUMIN: 4.1 g/dL (ref 3.5–5.0)
ALK PHOS: 74 U/L (ref 38–126)
ALT: 20 U/L (ref 0–44)
AST: 26 U/L (ref 15–41)
Anion gap: 14 (ref 5–15)
BUN: 19 mg/dL (ref 8–23)
CALCIUM: 9.6 mg/dL (ref 8.9–10.3)
CHLORIDE: 100 mmol/L (ref 98–111)
CO2: 24 mmol/L (ref 22–32)
CREATININE: 1.08 mg/dL (ref 0.61–1.24)
GFR calc Af Amer: >60 mL/min (ref >60)
GFR calc non Af Amer: >60 mL/min (ref >60)
GLUCOSE: 173 mg/dL — AB (ref 70–99)
Potassium: 4.7 mmol/L (ref 3.5–5.1)
Sodium: 138 mmol/L (ref 135–145)
Total Bilirubin: 0.7 mg/dL (ref 0.3–1.2)
Total Protein: 7.9 g/dL (ref 6.5–8.1)

## 2018-06-01 LAB — URINALYSIS, ROUTINE W REFLEX MICROSCOPIC
Bilirubin Urine: NEGATIVE
GLUCOSE, UA: 150 mg/dL — AB
HGB URINE DIPSTICK: NEGATIVE
KETONES UR: 5 mg/dL — AB
Leukocytes, UA: NEGATIVE
Nitrite: NEGATIVE
PH: 8 (ref 5.0–8.0)
Protein, ur: NEGATIVE mg/dL
Specific Gravity, Urine: 1.012 (ref 1.005–1.030)

## 2018-06-01 LAB — LIPASE, BLOOD: LIPASE: 18 U/L (ref 11–51)

## 2018-06-01 MED ORDER — PROMETHAZINE HCL 25 MG/ML IJ SOLN
12.5000 mg | Freq: Once | INTRAMUSCULAR | Status: AC
  Administered 2018-06-01: 12.5 mg via INTRAVENOUS
  Filled 2018-06-01: qty 1

## 2018-06-01 MED ORDER — ONDANSETRON 4 MG PO TBDP: 4.0000 mg | ORAL_TABLET | Freq: Three times a day (TID) | ORAL | 0 refills | Status: DC | PRN

## 2018-06-01 MED ORDER — SODIUM CHLORIDE 0.9 % IV BOLUS
1000.0000 mL | Freq: Once | INTRAVENOUS | Status: AC
  Administered 2018-06-01: 1000 mL via INTRAVENOUS

## 2018-06-01 MED ORDER — LORAZEPAM 2 MG/ML IJ SOLN
1.0000 mg | Freq: Once | INTRAMUSCULAR | Status: AC
  Administered 2018-06-01: 1 mg via INTRAVENOUS
  Filled 2018-06-01: qty 1

## 2018-06-01 NOTE — Discharge Instructions (Signed)
1. Medications: Take Zofran as needed for nausea.  Like this medication dissolve under your tongue.  Wait around 20 minutes before eating or drinking after taking this medication. 2. Treatment: rest, drink plenty of fluids, advance diet slowly.  Start with water and broth then advance to bland foods that will not upset your stomach such as crackers, mashed potatoes, and peanut butter. 3. Follow Up: Please followup with your primary doctor in 3 days for discussion of your diagnoses and further evaluation after today's visit; if you do not have a primary care doctor use the resource guide provided to find one; Please return to the ER for persistent vomiting, high fevers or worsening symptoms

## 2018-06-01 NOTE — ED Provider Notes (Signed)
[facility]-EMERGENCY DEPT Provider Note   CSN: [id number] Arrival date & time: 06/01/18  1345     History   Chief Complaint Chief Complaint  Patient presents with  . Nausea    HPI Samuel [name] is a 67 y.o. male with history of arthritis, CAD, GERD, gastroparesis, hypertension, hyperlipidemia, MI, OSA, peripheral neuropathy, PVD status post left BKA, PE, and type 2 diabetes mellitus presents for evaluation of acute onset, persistent nausea beginning at around 7 AM this morning.  He has a longstanding history of gastroparesis with frequent visits to the ED for management of nausea and vomiting.  He reports that he received an IM shot of Phenergan at around 7 AM without relief of his symptoms.  Endorses dry heaves but no vomiting, denies abdominal pain, fevers, chills, chest pain, shortness of breath, diarrhea, or constipation.  No urinary symptoms.  Reports this feels like his usual gastroparesis flares.  The history is provided by the patient.    Past Medical History:  Diagnosis Date  . Anxiety   . Arthritis    "all over"   . CAD (coronary artery disease)   . Charcot's joint    "left foot"  . 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 Depression   . Gastroparesis   . GERD (gastroesophageal reflux disease)   . H/O hiatal hernia   . Hyperlipidemia   . Hypertension   . Myocardial infarction (HCC) 2017/03/27   around this date  . OSA (obstructive sleep apnea)    "not bad enough for a mask"  . Peripheral neuropathy   . Peripheral vascular disease (HCC)   . PONV (postoperative nausea and vomiting)   . Pulmonary embolism (HCC)    hx. of 2012  . Shortness of breath    exertion  . Type II diabetes mellitus [facility])     Patient Active Problem List   Diagnosis Date Noted  . DKA, type 2 (HCC) 02/03/2018  . DOE (dyspnea on exertion) 01/24/2018  . Uncontrolled diabetes mellitus (HCC) 09/23/2017  . DKA, type 2, not at goal [facility]) 09/22/2017    . S/P BKA (below knee amputation) (HCC) 04/19/2017  . COPD (chronic obstructive pulmonary disease) (HCC) 04/19/2017  . NSTEMI (non-ST elevated myocardial infarction) (HCC) 04/07/2017  . Osteomyelitis of foot (HCC)   . Protein-calorie malnutrition, severe (HCC) 03/08/2015  . Osteomyelitis of foot, acute (HCC) 03/07/2015  . Diabetes mellitus type 2, uncontrolled (HCC) 03/07/2015  . Normocytic normochromic anemia 03/07/2015  . Obesity (BMI 30-39.9) 03/07/2015  . Nausea & vomiting   . DM (diabetes mellitus), type 2 (HCC) 01/07/2014  . Chronic osteomyelitis involving lower leg (HCC) 01/07/2014  . Vomiting 12/22/2013  . Gastroparesis 12/21/2013  . Intractable nausea and vomiting 12/21/2013  . Essential hypertension 05/01/2013  . Hyperlipidemia due to type 2 diabetes mellitus (HCC) 05/01/2013  . Generalized weakness 03/20/2013  . Back pain 03/20/2013  . Pulmonary embolism (HCC) 03/27/2011  . PERFORATION OF GALLBLADDER 08/30/2010  . Ulcer of lower limb, unspecified 03/01/2010  . METHICILLIN SUSCEPTIBLE STAPH AUREUS SEPTICEMIA 01/25/2010  . Acute osteomyelitis, ankle and foot 01/25/2010  . NAUSEA AND VOMITING 01/25/2010  . GERD 11/09/2009  . CAD S/P DES PCI to mLAD: Xience DES 2.75 x 15 (3.0 mm) 05/01/2009  . DIABETES, TYPE 1 05/14/2008  . OBSTRUCTIVE SLEEP APNEA 04/30/2008  . ALLERGIC RHINITIS, SEASONAL 04/30/2008    Past Surgical History:  Procedure Laterality Date  . AMPUTATION Left 03/08/2015   Procedure:  LEFT  AMPUTATION BELOW KNEE;  Surgeon: Toni Arthurs, MD;  Location: [facility] ORS;  Service: Orthopedics;  Laterality: Left;  . APPLICATION OF WOUND VAC Left 01/07/2014  . CHOLECYSTECTOMY  06/2010  . CORONARY ANGIOPLASTY WITH STENT PLACEMENT  05/2009   "1"  . FOOT SURGERY Left 2010   "for Charcot's joint"  . HARDWARE REMOVAL Left 01/07/2014   Procedure: LEFT LEG REMOVAL OF DEEP IMPLANT AND SEQUESTRECTOMY; APPLICATION OF WOUND VAC ;  Surgeon: Toni Arthurs, MD;  Location: [facility] OR;  Service:  Orthopedics;  Laterality: Left;  . I&D EXTREMITY Left "multiple"   leg  . 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 IM NAILING TIBIA Left ~ 2012  . LEFT HEART CATH AND CORONARY ANGIOGRAPHY N/A 04/08/2017   Procedure: LEFT HEART CATH AND CORONARY ANGIOGRAPHY;  Surgeon: Runell Gess, MD;  Location: [facility] INVASIVE CV LAB;  Service: Cardiovascular;  Laterality: N/A;  . SHOULDER ARTHROSCOPY W/ ROTATOR CUFF REPAIR Left    "and bone spurs"  . TIBIAL IM ROD REMOVAL Left 01/07/2014  . TOE AMPUTATION Right ~ 2011   "great toe"  . VENA CAVA FILTER PLACEMENT  2012  . VENA CAVA FILTER PLACEMENT N/A 11/20/2016   Procedure: INSERTION VENA-CAVA FILTER;  Surgeon: Sherren Kerns, MD;  Location: [facility] OR;  Service: Vascular;  Laterality: N/A;  . WOUND DEBRIDEMENT Left 01/07/2014   "tibia"        Home Medications    Prior to Admission medications   Medication Sig Start Date End Date Taking? Authorizing Provider  amLODipine (NORVASC) 10 MG tablet Take 1 tablet (10 mg total) by mouth daily. 11/22/16  Yes Richarda Overlie, MD  apixaban (ELIQUIS) 5 MG TABS tablet Take 1 tablet (5 mg total) by mouth 2 (two) times daily. 09/24/17  Yes Osvaldo Shipper, MD  aspirin 81 MG EC tablet Take 1 tablet (81 mg total) by mouth daily. 04/12/17  Yes Glade Lloyd, MD  atorvastatin (LIPITOR) 80 MG tablet Take 1 tablet (80 mg total) by mouth daily at 6 PM. 04/11/17  Yes Hanley Ben, Kshitiz, MD  clopidogrel (PLAVIX) 75 MG tablet Take 1 tablet (75 mg total) by mouth daily. 04/12/17  Yes Glade Lloyd, MD  ferrous sulfate 325 (65 FE) MG tablet Take 325 mg by mouth every evening.    Yes [provider]  furosemide (LASIX) 20 MG tablet Take 1 tablet (20 mg total) by mouth daily. 04/12/17  Yes Glade Lloyd, MD  gabapentin (NEURONTIN) 300 MG capsule Take 300 mg by mouth 4 (four) times daily.     Yes [provider]  ibuprofen (ADVIL,MOTRIN) 200 MG tablet Take 400 mg by mouth every 6 (six) hours as needed for moderate pain.   Yes [provider]  insulin degludec (TRESIBA FLEXTOUCH) 100 UNIT/ML SOPN FlexTouch Pen Inject 40 Units into the skin daily.    Yes Averneni, Carmon Sails, MD  insulin lispro (HUMALOG) 100 UNIT/ML injection Inject 12 units before each meal plus sliding scale   Yes Averneni, Madhavi, MD  lansoprazole (PREVACID) 30 MG capsule Take 30 mg by mouth 2 (two) times daily. 03/19/17  Yes [provider]  metFORMIN (GLUCOPHAGE-XR) 500 MG 24 hr tablet Take 500 mg by mouth 2 (two) times daily.  02/18/18  Yes [provider]  metoprolol tartrate (LOPRESSOR) 25 MG tablet Take 1 tablet (25 mg total) by mouth 2 (two) times daily. 04/11/17  Yes Glade LloydAlekh, Kshitiz, MD  promethazine (PHENERGAN) 25 MG tablet Take 25 mg by mouth every 6 (six) hours as needed for nausea or vomiting.  05/05/18  Yes [provider]  promethazine (PHENERGAN) 25 MG/ML injection Inject 25 mg into the muscle every 6 (six) hours as needed for nausea or vomiting.  04/22/18  Yes [provider]  ranitidine (ZANTAC) 150 MG tablet Take 150 mg by mouth 2 (two) times daily.    Yes [provider]  ranolazine (RANEXA) 500 MG 12 hr tablet Take 1 tablet (500 mg total) by mouth 2 (two) times daily. 04/11/17  Yes Glade LloydAlekh, Kshitiz, MD  traMADol (ULTRAM) 50 MG tablet Take 100 mg by mouth every 6 (six) hours as needed for moderate pain.    Yes [provider]  metoCLOPramide (REGLAN) 5 MG tablet Take 1 tablet (5 mg total) by mouth 3 (three) times daily. Patient not taking: Reported on 04/24/2018 01/02/18 04/22/18  Noralee Stainhoi, Jennifer, DO  nitroGLYCERIN (NITROSTAT) 0.4 MG SL tablet Place 1 tablet (0.4 mg total) under the tongue every 5 (five) minutes as needed for chest pain. 04/11/17   Glade LloydAlekh, Kshitiz, MD  ondansetron (ZOFRAN ODT) 4 MG disintegrating tablet Take 1 tablet (4 mg  total) by mouth every 8 (eight) hours as needed for nausea or vomiting. 06/01/18   Luevenia MaxinFawze, Marlynn PerkingMina A, PA-C  ONETOUCH VERIO test strip Use to test blood sugar twice daily 09/24/17   [provider]  promethazine (PHENERGAN) 25 MG suppository Place 1 suppository (25 mg total) rectally every 6 (six) hours as needed for nausea or vomiting. Patient not taking: Reported on 04/22/2018 04/18/18   Long, Arlyss RepressJoshua G, MD    Family History Family History  Problem Relation Age of Onset  . COPD Mother   . Heart disease Mother   . Lung cancer Mother   . Retinal detachment Father   . Alcoholism Brother   . Heart disease Brother   . COPD Brother   . Diabetes Brother   . Hypertension Brother   . Stroke Brother     Social History Social History   Tobacco Use  . Smoking status: Never Smoker  . Smokeless tobacco: Never Used  Substance Use Topics  . Alcohol use: No    Frequency: Never    Comment: 01/07/2014 'might have a wine cooler a couple times/yr"  . Drug use: No     Allergies   Gabapentin; Nabumetone; Reglan [metoclopramide]; and Codeine   Review of Systems Review of Systems  Constitutional: Negative for chills and fever.  Respiratory: Negative for shortness of breath.   Cardiovascular: Negative for chest pain.  Gastrointestinal: Positive for nausea and vomiting ("dry heaves"). Negative for abdominal pain, constipation and diarrhea.  Genitourinary: Negative for decreased urine volume, dysuria and hematuria.  All other systems reviewed and are negative.    Physical Exam Updated Vital Signs BP 124/74   Pulse 93   Temp 98.3 F (36.8 C) (Oral)   Resp 20   SpO2 97%   Physical Exam Vitals signs and nursing note reviewed.  Constitutional:      General: He is not in acute distress.    Appearance: He is well-developed. He is obese.     Comments: Appears uncomfortable  HENT:     Head: Normocephalic and atraumatic.  Eyes:     General:        Right eye: No discharge.        Left  eye: No discharge.     Conjunctiva/sclera: Conjunctivae  normal.  Neck:     Vascular: No JVD.     Trachea: No tracheal deviation.  Cardiovascular:     Rate and Rhythm: Normal rate.     Comments: Left BKA Pulmonary:     Effort: Pulmonary effort is normal.     Breath sounds: Normal breath sounds.  Chest:     Chest wall: No tenderness.  Abdominal:     General: Bowel sounds are decreased. There is no distension.     Tenderness: There is no abdominal tenderness. There is no guarding or rebound.  Skin:    General: Skin is warm and dry.     Findings: No erythema.  Neurological:     Mental Status: He is alert.  Psychiatric:        Behavior: Behavior normal.      ED Treatments / Results  Labs (all labs ordered are listed, but only abnormal results are displayed) Labs Reviewed  COMPREHENSIVE METABOLIC PANEL - Abnormal; Notable for the following components:      Result Value   Glucose, Bld 173 (*)    All other components within normal limits  URINALYSIS, ROUTINE W REFLEX MICROSCOPIC - Abnormal; Notable for the following components:   Color, Urine STRAW (*)    Glucose, UA 150 (*)    Ketones, ur 5 (*)    All other components within normal limits  LIPASE, BLOOD  CBC    EKG None  Radiology No results found.  Procedures Procedures (including critical care time)  Medications Ordered in ED Medications  LORazepam (ATIVAN) injection 1 mg (1 mg Intravenous Given 06/01/18 1558)  sodium chloride 0.9 % bolus 1,000 mL (0 mLs Intravenous Stopped 06/01/18 1715)  promethazine (PHENERGAN) injection 12.5 mg (12.5 mg Intravenous Given 06/01/18 1715)     Initial Impression / Assessment and Plan / ED Course  I have reviewed the triage vital signs and the nursing notes.  Pertinent labs & imaging results that were available during my care of the patient were reviewed by me and considered in my medical decision making (see chart for details).     Patient with history of gastroparesis  presents with nausea and dry heaving beginning earlier today.  Reports that is consistent with prior gastroparesis flares.  He is afebrile, initially mildly hypertensive with resolution on reevaluation.  Abdomen is soft and entirely nontender, no peritoneal signs on examination of the abdomen.  He is well-known to this ED with 13 visits in the last 6 months for similar symptoms.  Lab work reviewed by me shows mild hyperglycemia with glucose of 173, UA with some ketonuria though this appears to be his baseline.  He does not appear to be in DKA as anion gap is within normal limits.  No metabolic derangements or renal insufficiency.  No leukocytosis or anemia.  LFTs, lipase within normal limits.  UA does not suggest UTI or nephrolithiasis.  Given benign examination I do not feel the need to obtain advanced imaging of the abdomen.  Doubt acute surgical abdominal pathology.  Will give IV fluids and antiemetics and reassess.  6:00PM Patient resting comfortably in no apparent distress.  He is tolerated p.o. fluids and peanut butter and crackers without difficulty.  He reports he is feeling "100% better than when I came in".  He is requesting a refill of Zofran ODT which I think is reasonable.  Discussed advancing diet slowly and follow-up with PCP for reevaluation.  Discussed ED return precautions.  Patient and his caregiver verbalized understanding of  and agreement with plan and patient is stable for discharge home at this time. Final Clinical Impressions(s) / ED Diagnoses   Final diagnoses:  Nausea    ED Discharge Orders         Ordered    ondansetron (ZOFRAN ODT) 4 MG disintegrating tablet  Every 8 hours PRN     06/01/18 1806           Jeanie Sewer, PA-C 06/01/18 1818    Linwood Dibbles, MD 06/03/18 [phone]

## 2018-06-01 NOTE — ED Notes (Signed)
ED Provider at bedside. 

## 2018-06-01 NOTE — ED Triage Notes (Signed)
Patient c/o of nausea and dry heaves that started 6 hours ago. Last Known well 0730am this morning.  Denies diarrhea.  Last ate last night around 6pm.    A/Ox4.  Patient has left below knee amputation.  Hx. Gastroparesis.

## 2018-06-03 ENCOUNTER — Other Ambulatory Visit: Payer: Self-pay

## 2018-06-03 NOTE — Patient Outreach (Addendum)
[facility]) Care Management  06/03/2018  Beckie BusingJohn D Mota [birth year] [id number]   TELEPHONE SCREENING Referral date: 06/03/18 Referral source: utilization management Referral reason: medication assistance.  Insurance: health team advantage  Telephone call to patient regarding utilization management referral. HIPAA verified with patient. Explained reason for call.  Patient states he is still not able to afford his insulin, Tresiba and Humalog  at this point. He states he is running out of his insulin.  He reports being in the donut hole. Patient states [facility] a few months ago and was able to receive assistance with his oral diabetic medication.   Patient states he has been diabetic for approximately 25 years. He states his most recent A1c is 8.4.  He reports he has a follow up with his doctor in January 2019. Patient reports his blood sugar this morning was 48. Patient states, "I was shaking like a leaf inside and out."   Patient states he drinks 1/2 pint of orange juice when his blood sugar drops.  Patient reports his highest blood sugar within the past month has been in the high 300's.   Patient  states he was recently in the emergency room due  To a flare up with the gastroparesis.  RNCM discussed and offered [facility]. Patient verbally agreed.   PLAN: [facility] and health coach.   George InaDavina Nolawi Kanady RN,BSN,CCM [facility] Telephonic  [phone]

## 2018-06-04 ENCOUNTER — Telehealth: Payer: Self-pay | Admitting: Pharmacist

## 2018-06-04 DIAGNOSIS — Z794 Long term (current) use of insulin: Secondary | ICD-10-CM | POA: Diagnosis not present

## 2018-06-04 DIAGNOSIS — M25562 Pain in left knee: Secondary | ICD-10-CM | POA: Diagnosis not present

## 2018-06-04 DIAGNOSIS — G4733 Obstructive sleep apnea (adult) (pediatric): Secondary | ICD-10-CM | POA: Diagnosis not present

## 2018-06-04 DIAGNOSIS — I5032 Chronic diastolic (congestive) heart failure: Secondary | ICD-10-CM | POA: Diagnosis not present

## 2018-06-04 DIAGNOSIS — Z7901 Long term (current) use of anticoagulants: Secondary | ICD-10-CM | POA: Diagnosis not present

## 2018-06-04 DIAGNOSIS — J40 Bronchitis, not specified as acute or chronic: Secondary | ICD-10-CM | POA: Diagnosis not present

## 2018-06-04 DIAGNOSIS — Z86711 Personal history of pulmonary embolism: Secondary | ICD-10-CM | POA: Diagnosis not present

## 2018-06-04 DIAGNOSIS — Z89512 Acquired absence of left leg below knee: Secondary | ICD-10-CM | POA: Diagnosis not present

## 2018-06-04 DIAGNOSIS — I872 Venous insufficiency (chronic) (peripheral): Secondary | ICD-10-CM | POA: Diagnosis not present

## 2018-06-04 DIAGNOSIS — I2510 Atherosclerotic heart disease of native coronary artery without angina pectoris: Secondary | ICD-10-CM | POA: Diagnosis not present

## 2018-06-04 DIAGNOSIS — I110 Hypertensive heart disease with heart failure: Secondary | ICD-10-CM | POA: Diagnosis not present

## 2018-06-04 DIAGNOSIS — Z7982 Long term (current) use of aspirin: Secondary | ICD-10-CM | POA: Diagnosis not present

## 2018-06-04 DIAGNOSIS — E1143 Type 2 diabetes mellitus with diabetic autonomic (poly)neuropathy: Secondary | ICD-10-CM | POA: Diagnosis not present

## 2018-06-04 DIAGNOSIS — E1140 Type 2 diabetes mellitus with diabetic neuropathy, unspecified: Secondary | ICD-10-CM | POA: Diagnosis not present

## 2018-06-04 DIAGNOSIS — E1151 Type 2 diabetes mellitus with diabetic peripheral angiopathy without gangrene: Secondary | ICD-10-CM | POA: Diagnosis not present

## 2018-06-04 DIAGNOSIS — Z955 Presence of coronary angioplasty implant and graft: Secondary | ICD-10-CM | POA: Diagnosis not present

## 2018-06-04 DIAGNOSIS — Z9181 History of falling: Secondary | ICD-10-CM | POA: Diagnosis not present

## 2018-06-04 DIAGNOSIS — Z7902 Long term (current) use of antithrombotics/antiplatelets: Secondary | ICD-10-CM | POA: Diagnosis not present

## 2018-06-04 DIAGNOSIS — Z86718 Personal history of other venous thrombosis and embolism: Secondary | ICD-10-CM | POA: Diagnosis not present

## 2018-06-04 DIAGNOSIS — K3184 Gastroparesis: Secondary | ICD-10-CM | POA: Diagnosis not present

## 2018-06-04 NOTE — Patient Outreach (Addendum)
[facility]) Care Management  06/04/2018  NOELLE HOOGLAND September 29, 1950 [id number]   Patient was called regarding medication assistance with Tresiba and Humalog. HIPAA identifiers were obtained. Patient was previously help by [facility] with medication assistance with Eliquis.  In September of 2019, his provider's office, Dr. Garnet Koyanagi stated they would take over the patient's patient assistance needs.  They had been asked to switch the patient to [place] as he had not met the out-of-pocket spend requirement for [employer] to be approved.  Dr. Garnet Koyanagi nurse said she was unwilling to switch the patient.  As such, the patient's case was closed.    Patient reported he was denied from [employer] program due to not meeting their out-of-pocket spend requirement. He said he now has about two weeks of Tresiba left. He also stated that he is using [employer]ion Novolin 70/30 pens on a sliding scale before meals.  Novolin 70/30 is usually not effective on a sliding scale dosing regimen because it includes short acting and intermediate acting insulin.  In addition, the patient could potentially experience some hypoglycemia due to the onset of action of 70/30 insulin.   Dr. Festus Holts office was called today and another request was made to switch the patient to [place] as it can be obtained from the [employer] with a hardship letter.  Plan: Follow up in 2-3 business days.   Elayne Guerin, PharmD, [place] Clinical Pharmacist [phone]

## 2018-06-06 ENCOUNTER — Other Ambulatory Visit: Payer: Self-pay | Admitting: Pharmacist

## 2018-06-06 ENCOUNTER — Other Ambulatory Visit: Payer: Self-pay | Admitting: Pharmacy Technician

## 2018-06-06 NOTE — Patient Outreach (Signed)
[facility]) Care Management  06/06/2018  Samuel BusingJohn D Hunnicutt 05-06-1951 [id number]   Incoming call received from JaconitaStephanie at [facility] in regards to a fax that was sent.  [facility] RPh Nunzio CobbsKatina Boyd had asked that [employer] [facility] [employer] [facility] coupons be faxed to [facility] for patient. Judeth CornfieldStephanie called to inform that she had no Lilly Insulin orders on file for this patient. She only had [place]iba and [address].  Informed Judeth CornfieldStephanie to hold on to the coupons as we are awaiting the dr to change the medications to Lilly products due to affordability issues. Judeth CornfieldStephanie verbalized understanding.   Will route note to [facility] Nunzio CobbsKatina Boyd for followup.  Jericka Kadar P. Anthonia Monger, CPhT [facility] Care Management [phone]

## 2018-06-09 ENCOUNTER — Other Ambulatory Visit: Payer: Self-pay | Admitting: Pharmacy Technician

## 2018-06-09 ENCOUNTER — Encounter: Payer: Self-pay | Admitting: Pharmacist

## 2018-06-09 ENCOUNTER — Ambulatory Visit: Payer: PPO | Admitting: Pharmacist

## 2018-06-09 NOTE — Patient Outreach (Addendum)
[facility]) Care Management  06/09/2018  Beckie BusingJohn D Procida Oct 09, 1950 [id number]   LATE ENTRY FOR 06/06/18  Patient was called to follow up on medication assistance as the nurse from Dr. Derald MacleodAverneni's office called back and said they would be willing to switch the patient over to [facility].  HIPAA identifiers were obtained. Patient said he only had 2 weeks worth of Tresiba samples left.  With patient on conference call, the [name] Diabetes [facility] Center was called.  The representative emailed coupons for the patient to receive a month supply of both Humalog and Basaglar.  The coupons were faxed to [facility] on the patient's behalf. Patient called his PCP to request new prescriptions be sent to the pharmacy for Basaglar and Humalog so the coupons can be used.  Plan: Patient will call back if there are any issues.  Send Noreene LarssonJill Simcox a letter to be sent to the patient for [employer]'s program. Follow up on applications in 2 weeks.   Beecher McardleKatina J. Aryanna Shaver, PharmD, BCACP [facility] Clinical Pharmacist [phone]

## 2018-06-09 NOTE — Patient Outreach (Signed)
[facility]) Care Management  06/09/2018  Beckie BusingJohn D Antonetti 10/20/1950 [id number]    Received 2020 patient assistance referral from [facility] R[facility] Nunzio CobbsKatina Boyd for Basagalr & Humalog through [employer].  Prepared patient portion to be mailed. Faxed provider portion to Dr. Gayla MedicusAverneni.  Will followup with patient in 7-10 business days to inquire if application has been received.  Tyja Gortney P. Dianelly Ferran, CPhT [facility] Care Management [phone]

## 2018-06-13 ENCOUNTER — Ambulatory Visit: Payer: PPO | Admitting: Pharmacist

## 2018-06-13 ENCOUNTER — Other Ambulatory Visit: Payer: Self-pay | Admitting: Pharmacist

## 2018-06-13 NOTE — Patient Outreach (Signed)
[facility]) Care Management  06/13/2018  Beckie BusingJohn D Kibby [birth year] [id number]   Patient called and said he had not received his applications and wondered if we sent them.  His chart was reviewed. Noreene LarssonJill Simcox documented sending he patient's application on 06/09/18.  Since the Christmas Holiday just occurred, patient was asked to give it a few more days as the applications have to go to the [facility].    Patient also shared that he had not been able to get the Basaglar and Humalog filled using the coupon obtained through [employer].  He said [facility] had the coupon but were still waiting on his provider's office to clarify the prescriptions.  Patient said he had just spoken with someone at his provider's office and was assured he would get his insulin on Monday.   Patient was advised to give me a call back if he was not able to get his medications filled.   Beecher McardleKatina J. Almond Fitzgibbon, PharmD, BCACP [facility] Clinical Pharmacist [phone]

## 2018-06-20 ENCOUNTER — Other Ambulatory Visit: Payer: Self-pay | Admitting: Pharmacy Technician

## 2018-06-20 NOTE — Patient Outreach (Signed)
[facility]) Care Management  06/20/2018  Samuel [name] 1950/08/29 [id number]   Unsuccessful outreach call placed to patient in regards to his Lilly patient assistance application for Basaglar and Humalog.  Called to inquire if patient had received his applications in the mail but unfortunately patient did not answer his phone. HIPAA compliant voicemail left.  Will followup iwith patient n 3-5 business days if call is not returned.  [name] P. Tehillah Cipriani, CPhT Musician Care Management [phone]

## 2018-06-24 ENCOUNTER — Other Ambulatory Visit: Payer: Self-pay | Admitting: *Deleted

## 2018-06-24 ENCOUNTER — Other Ambulatory Visit: Payer: Self-pay | Admitting: Pharmacist

## 2018-06-24 ENCOUNTER — Ambulatory Visit: Payer: PPO | Admitting: Pharmacist

## 2018-06-24 ENCOUNTER — Other Ambulatory Visit: Payer: Self-pay | Admitting: Pharmacy Technician

## 2018-06-24 NOTE — Patient Outreach (Addendum)
[facility]) Care Management  06/24/2018  Samuel [name] 1950/12/29 [id number]   Patient was called regarding medication assistance. Unfortunately, he did not answer the phone. HIPAA compliant message was left on his voicemail. Noreene Larsson Simcox called the patient on 06/20/18 but was unable to reach him.  Plan: Send patient an unsuccessful contact letter.  Route note to [employer] as FYI. Will follow up with the patient in 5-7 business days.  [name], PharmD, BCACP [facility] Clinical Pharmacist [phone]  ADDENDUM  Patient called back and confirmed he received the application and that he mailed it back at the end of last week.  Plan: Route note to [facility] for follow up.   [name], PharmD, BCACP [facility] Clinical Pharmacist [phone]

## 2018-06-24 NOTE — Patient Outreach (Signed)
[facility]) Care Management  06/24/2018  Samuel [name] 08-20-1950 [id number]  Received all necessary documents and signautres from both patient and provider for [facility] patient assistance application for Basaglar and Humalog.  Submitted completed application to [employer] via fax.  Will followup with [name] in 7-10 business days to inquire on status of application.  Ranvir Renovato P. Arbie Reisz, CPhT Musician Care Management [phone]

## 2018-06-24 NOTE — Patient Outreach (Signed)
[facility] Customer service manager [facility]) Care Management  06/24/2018  Samuel [name] 06/22/1950 [id number]  RN Health Coach telephone call to patient.  Hipaa compliance verified. As Health Coach was leaving a  Voicemail message the patient picked up. Per patient he is having a very bad bout with gastroparesis and is very nauseated. Per patient request for Health Coach to call him back at a later date. RN also informed patient that the pharmacy had also called and left a message.   Plan: RN will call patient back within 10 business days.  Gean Maidens BSN RN [facility] Care Management [phone]

## 2018-06-25 DIAGNOSIS — Z01818 Encounter for other preprocedural examination: Secondary | ICD-10-CM | POA: Diagnosis not present

## 2018-06-25 DIAGNOSIS — H259 Unspecified age-related cataract: Secondary | ICD-10-CM | POA: Diagnosis not present

## 2018-06-25 DIAGNOSIS — S81811A Laceration without foreign body, right lower leg, initial encounter: Secondary | ICD-10-CM | POA: Diagnosis not present

## 2018-07-01 ENCOUNTER — Other Ambulatory Visit: Payer: Self-pay | Admitting: *Deleted

## 2018-07-01 ENCOUNTER — Ambulatory Visit: Payer: Self-pay | Admitting: Pharmacist

## 2018-07-01 DIAGNOSIS — H2511 Age-related nuclear cataract, right eye: Secondary | ICD-10-CM | POA: Diagnosis not present

## 2018-07-01 DIAGNOSIS — H25811 Combined forms of age-related cataract, right eye: Secondary | ICD-10-CM | POA: Diagnosis not present

## 2018-07-01 NOTE — Patient Outreach (Signed)
[facility]) Care Management  07/01/2018  Samuel [name] November 22, 1950 [id number]   RN Health Coach attempted follow up outreach call to patient.  Patient was in bed asleep.  HIPPA compliance message left with male answering phone.  Plan: RN will call patient again within 30 days.  Gean Maidens BSN RN [facility] Care Management [phone]

## 2018-07-03 ENCOUNTER — Other Ambulatory Visit: Payer: Self-pay | Admitting: Pharmacy Technician

## 2018-07-03 NOTE — Patient Outreach (Signed)
[facility]) Care Management  07/03/2018  Samuel [name] 05-26-1951 [id number]    Care coordination call placed to [facility] in regards to patient's application for Basaglar and Humalog.  Spoke to [place] who said patient had been APPROVED from 07/01/2018-06/18/2019. Patient would be receiving 4 boxes of Basaglar and 10 vials of Humalog in approximately 7-10 business days from today.  Will notify patient of his approval.  Aidel Davisson P. Donshay Lupinski, CPhT Musician Care Management [phone]

## 2018-07-03 NOTE — Patient Outreach (Signed)
[facility]) Care Management  07/03/2018  Samuel [name] 1950/09/22 [id number]   ADDENDUM  Successful outreach call placed to patient in regards to his patient assistance application for Basaglar and Humalog.  Spoke to [name], HIPAA identifiers verified. Informed Mr. Persons that he had been approved for his Basaglar and Humalog for 2020 and when delivery should be expected at provider's office. Discussed with patient how to obtain refills and to get home delivery. Patient stated he understood.  Will followup with paitent in 10-14 business days to confirm receipt of medication.  Meryl Hubers P. Dewan Emond, CPhT Musician Care Management [phone]

## 2018-07-07 DIAGNOSIS — E1165 Type 2 diabetes mellitus with hyperglycemia: Secondary | ICD-10-CM | POA: Diagnosis not present

## 2018-07-07 DIAGNOSIS — Z794 Long term (current) use of insulin: Secondary | ICD-10-CM | POA: Diagnosis not present

## 2018-07-07 DIAGNOSIS — I2510 Atherosclerotic heart disease of native coronary artery without angina pectoris: Secondary | ICD-10-CM | POA: Diagnosis not present

## 2018-07-07 DIAGNOSIS — Z23 Encounter for immunization: Secondary | ICD-10-CM | POA: Diagnosis not present

## 2018-07-07 DIAGNOSIS — I10 Essential (primary) hypertension: Secondary | ICD-10-CM | POA: Diagnosis not present

## 2018-07-07 DIAGNOSIS — S88112A Complete traumatic amputation at level between knee and ankle, left lower leg, initial encounter: Secondary | ICD-10-CM | POA: Diagnosis not present

## 2018-07-14 ENCOUNTER — Other Ambulatory Visit: Payer: Self-pay | Admitting: *Deleted

## 2018-07-14 ENCOUNTER — Other Ambulatory Visit: Payer: Self-pay | Admitting: Pharmacy Technician

## 2018-07-14 DIAGNOSIS — Z89512 Acquired absence of left leg below knee: Secondary | ICD-10-CM | POA: Diagnosis not present

## 2018-07-14 DIAGNOSIS — K3184 Gastroparesis: Secondary | ICD-10-CM | POA: Diagnosis not present

## 2018-07-14 NOTE — Patient Outreach (Signed)
[facility]) Care Management  07/14/2018  MATISYAHU FEWKES 1950-09-06 [id number]   Unsuccessful outreach call placed to patient in regards to [employer] application for Basaglar and Humalog.  Unfortunately patient did not answer the phone. HIPAA compliant voicemail left. Was calling to inquire if patient had picked up his medications and to see if he had any questions or concerns on how to order his refills.  Will followup in 3-5 business days if call is not returned.  Quaron Delacruz P. Roslin Norwood, CPhT Musician Care Management [phone]

## 2018-07-14 NOTE — Patient Outreach (Signed)
[facility] Care Management  07/14/2018  Samuel BusingJohn D [name] [birth year] [id number]  RN Health Coach telephone call to patient.  Hipaa compliance verified. Per person answering the patient is in the bed nauseated. RN left Hipaa compliance message that RN Health Coach called.    Plan: RN will send out unsuccessful outreach letter  RN will follow up outreach call within 14 days  Gean MaidensFrances Laxmi Choung BSN RN [facility] Care Management [phone]

## 2018-07-15 ENCOUNTER — Other Ambulatory Visit: Payer: Self-pay | Admitting: Pharmacist

## 2018-07-15 ENCOUNTER — Ambulatory Visit: Payer: Self-pay | Admitting: *Deleted

## 2018-07-15 NOTE — Patient Outreach (Signed)
[facility]) Care Management  07/15/2018  Samuel [name] July 23, 1950 [id number]   Spoke with patient. HIPAA identifiers were obtained. Patient confirmed he received Basaglar from [employer] and that his provider's office called and said his Humalog is ready for pick up.  Patient said he is planning on going to pick up the Humalog today.   Patient reported his HgA1c was 7.5% at his last visit and that his fasting blood sugar this morning was 165 mg/dl.  Plan: Follow up with the patient in 2 weeks.   [name], PharmD, BCACP [facility] Clinical Pharmacist [phone]

## 2018-07-16 DIAGNOSIS — Z89512 Acquired absence of left leg below knee: Secondary | ICD-10-CM | POA: Diagnosis not present

## 2018-07-18 ENCOUNTER — Other Ambulatory Visit: Payer: Self-pay | Admitting: Pharmacy Technician

## 2018-07-18 NOTE — Patient Outreach (Signed)
[facility]) Care Management  07/18/2018  Samuel [name] 08-05-1950 [id number]   Successful outreach call placed to patient in regards to [employer] application for Basaglar and Humalog.  Spoke to patient, HIPAA identifiers verified. Inquired of patient if he had picked up his medications from the provider's office and patient stated he had. Inquired if patient had any questions or concerns, patient asked if I knew why the company sent him the vial for the Humalog. Informed patient that the dr checked the box for vials on the application. Informed patient that the refill process has to go through the provider's office. Explained to patient that [name] would send the provider's office a fax when they see that it is time for his refill. The provider's office has to fill out a form and fax it back in. Advised patient that at that time is when they could change his Humalog to vials. Informed patient that when he starts to run low to go ahead and reach out to the provider's office and inform them that he would prefer to have Humalog kwikpen. Patient verbalized understanding and said he would make sure they make that change for him. Patient stated he had no other questions or concerns at this time. Confirmed with patient he had our name and number for the future.  Will route note to [facility] RPh [name] for case closure for patient assistance.  Kitt Minardi P. Pawel Soules, CPhT Musician Care Management [phone]

## 2018-07-28 ENCOUNTER — Other Ambulatory Visit: Payer: Self-pay | Admitting: *Deleted

## 2018-07-28 NOTE — Patient Outreach (Signed)
[facility]) Care Management  07/28/2018   Samuel [name] 02/21/1951 [id number]  RN Health Coach telephone call to patient.  Hipaa compliance verified. Per patient he is having many episodes of gastroparesis. Patient stated he is having nausea and dry heaves. He takes Zofran and phenergan. Patient A1C is 10.9. His blood sugars is 139 today.  Patient stated sometimes he does not eat. He drinks ginger ale and eat crackers. Per patient he had rt cataract done last month and left cataract is to be done [id number]. Patient has agreed to further outreach calls.   Current Medications:  Current Outpatient Medications  Medication Sig Dispense Refill  . amLODipine (NORVASC) 10 MG tablet Take 1 tablet (10 mg total) by mouth daily. 30 tablet 2  . apixaban (ELIQUIS) 5 MG TABS tablet Take 1 tablet (5 mg total) by mouth 2 (two) times daily. 60 tablet 0  . aspirin 81 MG EC tablet Take 1 tablet (81 mg total) by mouth daily. 30 tablet 0  . atorvastatin (LIPITOR) 80 MG tablet Take 1 tablet (80 mg total) by mouth daily at 6 PM. 30 tablet 0  . clopidogrel (PLAVIX) 75 MG tablet Take 1 tablet (75 mg total) by mouth daily. 30 tablet 0  . ferrous sulfate 325 (65 FE) MG tablet Take 325 mg by mouth every evening.     . furosemide (LASIX) 20 MG tablet Take 1 tablet (20 mg total) by mouth daily. 30 tablet 0  . gabapentin (NEURONTIN) 300 MG capsule Take 300 mg by mouth 4 (four) times daily.     [name] ibuprofen (ADVIL,MOTRIN) 200 MG tablet Take 400 mg by mouth every 6 (six) hours as needed for moderate pain.    . Insulin Glargine (BASAGLAR KWIKPEN) 100 UNIT/ML SOPN Inject 40 Units into the skin daily.    . insulin lispro (HUMALOG) 100 UNIT/ML injection Inject 12 units before each meal plus sliding scale    . lansoprazole (PREVACID) 30 MG capsule Take 30 mg by mouth 2 (two) times daily.  12  . metFORMIN (GLUCOPHAGE-XR) 500 MG 24 hr tablet Take 500 mg by mouth 2 (two) times daily.   1  . metoCLOPramide (REGLAN) 5  MG tablet Take 1 tablet (5 mg total) by mouth 3 (three) times daily. (Patient not taking: Reported on 04/24/2018) 90 tablet 0  . metoprolol tartrate (LOPRESSOR) 25 MG tablet Take 1 tablet (25 mg total) by mouth 2 (two) times daily. 60 tablet 0  . nitroGLYCERIN (NITROSTAT) 0.4 MG SL tablet Place 1 tablet (0.4 mg total) under the tongue every 5 (five) minutes as needed for chest pain. 30 tablet 0  . ondansetron (ZOFRAN ODT) 4 MG disintegrating tablet Take 1 tablet (4 mg total) by mouth every 8 (eight) hours as needed for nausea or vomiting. 10 tablet 0  . ONETOUCH VERIO test strip Use to test blood sugar twice daily  4  . promethazine (PHENERGAN) 25 MG suppository Place 1 suppository (25 mg total) rectally every 6 (six) hours as needed for nausea or vomiting. 12 each 0  . promethazine (PHENERGAN) 25 MG tablet Take 25 mg by mouth every 6 (six) hours as needed for nausea or vomiting.   5  . promethazine (PHENERGAN) 25 MG/ML injection Inject 25 mg into the muscle every 6 (six) hours as needed for nausea or vomiting.   4  . ranitidine (ZANTAC) 150 MG tablet Take 150 mg by mouth 2 (two) times daily.     . ranolazine (RANEXA) 500 MG 12  hr tablet Take 1 tablet (500 mg total) by mouth 2 (two) times daily. 60 tablet 0  . traMADol (ULTRAM) 50 MG tablet Take 100 mg by mouth every 6 (six) hours as needed for moderate pain.      No current facility-administered medications for this visit.     Functional Status:  In your present state of health, do you have any difficulty performing the following activities: 07/28/2018 02/03/2018  Hearing? N N  Vision? Y Y  Comment - cataracts in both eyes  Difficulty concentrating or making decisions? N N  Walking or climbing stairs? Y Y  Dressing or bathing? Y Y  Doing errands, shopping? N Y  Quarry managerreparing Food and eating ? Y -  Using the Toilet? N -  In the past six months, have you accidently leaked urine? N -  Do you have problems with loss of bowel control? N -  Managing  your Medications? N -  Managing your Finances? N -  Housekeeping or managing your Housekeeping? Y -  Some recent data might be hidden    Fall/Depression Screening: Fall Risk  07/28/2018 09/25/2017 08/27/2017  Falls in the past year? 0 No (No Data)  Comment - - Patient denies new/ recent falls  Number falls in past yr: - - -  Injury with Fall? - - -  Risk Factor Category  - - -  Risk for fall due to : - - -  Follow up - - -   PHQ 2/9 Scores 07/28/2018 06/03/2018 09/25/2017 06/14/2017 04/19/2017 04/12/2017 05/03/2014  PHQ - 2 Score 1 1 0 0 0 0 0    Assessment:  A1C 10.9 Fasting blood sugar is 139 Right cataract surgery done [id number] Per patient he  is having episodes of gastroparesis Patient will benefit from Health Coach telephonic outreach for education and support for diabetes self management.  Plan:  RN sent Clinical Key education on Gastroparesis RN sent 2020 Calendar book 'RN discussed medication adherence RN sent education material on low carb healthy snacks Patient is having left cataract surgery done 07/29/2018 RN sent sick day education for diabetics RN sent a barriers letter to PCP and assessment RN will follow up within the month of April  [name] BSN RN [facility] Care Management [phone]

## 2018-07-29 ENCOUNTER — Ambulatory Visit: Payer: Self-pay | Admitting: Pharmacist

## 2018-07-29 DIAGNOSIS — H25812 Combined forms of age-related cataract, left eye: Secondary | ICD-10-CM | POA: Diagnosis not present

## 2018-07-29 DIAGNOSIS — H2512 Age-related nuclear cataract, left eye: Secondary | ICD-10-CM | POA: Diagnosis not present

## 2018-08-01 ENCOUNTER — Other Ambulatory Visit: Payer: Self-pay | Admitting: Pharmacist

## 2018-08-01 ENCOUNTER — Ambulatory Visit: Payer: Self-pay | Admitting: Pharmacist

## 2018-08-01 NOTE — Patient Outreach (Addendum)
[facility]) Care Management  08/01/2018  Samuel [name] October 06, 1950 [id number]   Patient was called for follow up on med assistance and diabetes control.  Unfortunately, he did not answer the phone. HIPAA compliant message was left on his voicemail.  Plan: Call patient back in 10-14 business days.   [name], PharmD, BCACP [facility] Clinical Pharmacist [phone]

## 2018-08-06 ENCOUNTER — Emergency Department (HOSPITAL_COMMUNITY)
Admit: 2018-08-06 | Discharge: 2018-08-06 | Discharge status: Against Medical Advice | Payer: PPO | Attending: Emergency Medicine | Admitting: Emergency Medicine

## 2018-08-06 ENCOUNTER — Encounter (HOSPITAL_COMMUNITY): Payer: Self-pay

## 2018-08-06 DIAGNOSIS — I10 Essential (primary) hypertension: Secondary | ICD-10-CM | POA: Diagnosis not present

## 2018-08-06 DIAGNOSIS — R109 Unspecified abdominal pain: Secondary | ICD-10-CM | POA: Diagnosis not present

## 2018-08-06 DIAGNOSIS — R110 Nausea: Secondary | ICD-10-CM | POA: Diagnosis not present

## 2018-08-06 DIAGNOSIS — Z5321 Procedure and treatment not carried out due to patient leaving prior to being seen by health care provider: Secondary | ICD-10-CM | POA: Diagnosis not present

## 2018-08-06 NOTE — ED Triage Notes (Signed)
Pt arrived from home stating he has a history of gastroparesis and this feels like a normal flare up from him. Nausea but no vomiting.

## 2018-08-16 ENCOUNTER — Encounter (HOSPITAL_COMMUNITY): Payer: Self-pay | Admitting: Emergency Medicine

## 2018-08-16 ENCOUNTER — Inpatient Hospital Stay (HOSPITAL_COMMUNITY)
Admission: EM | Admit: 2018-08-16 | Discharge: 2018-08-21 | DRG: 074 | Disposition: A | Discharge status: Home / Self Care | Payer: PPO | Attending: Internal Medicine | Admitting: Internal Medicine

## 2018-08-16 ENCOUNTER — Emergency Department (HOSPITAL_COMMUNITY): Payer: PPO

## 2018-08-16 ENCOUNTER — Other Ambulatory Visit: Payer: Self-pay

## 2018-08-16 DIAGNOSIS — Z833 Family history of diabetes mellitus: Secondary | ICD-10-CM

## 2018-08-16 DIAGNOSIS — Z66 Do not resuscitate: Secondary | ICD-10-CM | POA: Diagnosis present

## 2018-08-16 DIAGNOSIS — I252 Old myocardial infarction: Secondary | ICD-10-CM | POA: Diagnosis not present

## 2018-08-16 DIAGNOSIS — Z794 Long term (current) use of insulin: Secondary | ICD-10-CM

## 2018-08-16 DIAGNOSIS — E11610 Type 2 diabetes mellitus with diabetic neuropathic arthropathy: Secondary | ICD-10-CM | POA: Diagnosis not present

## 2018-08-16 DIAGNOSIS — E785 Hyperlipidemia, unspecified: Secondary | ICD-10-CM | POA: Diagnosis not present

## 2018-08-16 DIAGNOSIS — I213 ST elevation (STEMI) myocardial infarction of unspecified site: Secondary | ICD-10-CM | POA: Diagnosis not present

## 2018-08-16 DIAGNOSIS — Z89512 Acquired absence of left leg below knee: Secondary | ICD-10-CM

## 2018-08-16 DIAGNOSIS — Z7982 Long term (current) use of aspirin: Secondary | ICD-10-CM

## 2018-08-16 DIAGNOSIS — Z8249 Family history of ischemic heart disease and other diseases of the circulatory system: Secondary | ICD-10-CM

## 2018-08-16 DIAGNOSIS — Z7902 Long term (current) use of antithrombotics/antiplatelets: Secondary | ICD-10-CM | POA: Diagnosis not present

## 2018-08-16 DIAGNOSIS — K219 Gastro-esophageal reflux disease without esophagitis: Secondary | ICD-10-CM | POA: Diagnosis present

## 2018-08-16 DIAGNOSIS — R109 Unspecified abdominal pain: Secondary | ICD-10-CM | POA: Diagnosis not present

## 2018-08-16 DIAGNOSIS — I110 Hypertensive heart disease with heart failure: Secondary | ICD-10-CM | POA: Diagnosis not present

## 2018-08-16 DIAGNOSIS — Z801 Family history of malignant neoplasm of trachea, bronchus and lung: Secondary | ICD-10-CM

## 2018-08-16 DIAGNOSIS — R0602 Shortness of breath: Secondary | ICD-10-CM | POA: Diagnosis present

## 2018-08-16 DIAGNOSIS — R072 Precordial pain: Secondary | ICD-10-CM | POA: Diagnosis not present

## 2018-08-16 DIAGNOSIS — E1169 Type 2 diabetes mellitus with other specified complication: Secondary | ICD-10-CM

## 2018-08-16 DIAGNOSIS — Z955 Presence of coronary angioplasty implant and graft: Secondary | ICD-10-CM

## 2018-08-16 DIAGNOSIS — R079 Chest pain, unspecified: Secondary | ICD-10-CM | POA: Diagnosis not present

## 2018-08-16 DIAGNOSIS — R0902 Hypoxemia: Secondary | ICD-10-CM | POA: Diagnosis not present

## 2018-08-16 DIAGNOSIS — I5032 Chronic diastolic (congestive) heart failure: Secondary | ICD-10-CM | POA: Diagnosis present

## 2018-08-16 DIAGNOSIS — I499 Cardiac arrhythmia, unspecified: Secondary | ICD-10-CM | POA: Diagnosis not present

## 2018-08-16 DIAGNOSIS — Z7901 Long term (current) use of anticoagulants: Secondary | ICD-10-CM | POA: Diagnosis not present

## 2018-08-16 DIAGNOSIS — Z79899 Other long term (current) drug therapy: Secondary | ICD-10-CM | POA: Diagnosis not present

## 2018-08-16 DIAGNOSIS — E876 Hypokalemia: Secondary | ICD-10-CM | POA: Diagnosis not present

## 2018-08-16 DIAGNOSIS — Z823 Family history of stroke: Secondary | ICD-10-CM

## 2018-08-16 DIAGNOSIS — I2782 Chronic pulmonary embolism: Secondary | ICD-10-CM | POA: Diagnosis not present

## 2018-08-16 DIAGNOSIS — I2510 Atherosclerotic heart disease of native coronary artery without angina pectoris: Secondary | ICD-10-CM | POA: Diagnosis present

## 2018-08-16 DIAGNOSIS — Z89411 Acquired absence of right great toe: Secondary | ICD-10-CM

## 2018-08-16 DIAGNOSIS — R9431 Abnormal electrocardiogram [ECG] [EKG]: Secondary | ICD-10-CM | POA: Diagnosis not present

## 2018-08-16 DIAGNOSIS — N179 Acute kidney failure, unspecified: Secondary | ICD-10-CM | POA: Diagnosis not present

## 2018-08-16 DIAGNOSIS — E1165 Type 2 diabetes mellitus with hyperglycemia: Secondary | ICD-10-CM | POA: Diagnosis present

## 2018-08-16 DIAGNOSIS — D72829 Elevated white blood cell count, unspecified: Secondary | ICD-10-CM | POA: Diagnosis not present

## 2018-08-16 DIAGNOSIS — R197 Diarrhea, unspecified: Secondary | ICD-10-CM | POA: Diagnosis present

## 2018-08-16 DIAGNOSIS — Z95828 Presence of other vascular implants and grafts: Secondary | ICD-10-CM

## 2018-08-16 DIAGNOSIS — E1143 Type 2 diabetes mellitus with diabetic autonomic (poly)neuropathy: Principal | ICD-10-CM | POA: Diagnosis present

## 2018-08-16 DIAGNOSIS — G4733 Obstructive sleep apnea (adult) (pediatric): Secondary | ICD-10-CM | POA: Diagnosis present

## 2018-08-16 DIAGNOSIS — K3184 Gastroparesis: Secondary | ICD-10-CM | POA: Diagnosis present

## 2018-08-16 DIAGNOSIS — E1151 Type 2 diabetes mellitus with diabetic peripheral angiopathy without gangrene: Secondary | ICD-10-CM | POA: Diagnosis not present

## 2018-08-16 DIAGNOSIS — H59312 Postprocedural hemorrhage and hematoma of left eye and adnexa following an ophthalmic procedure: Secondary | ICD-10-CM | POA: Diagnosis not present

## 2018-08-16 DIAGNOSIS — Z825 Family history of asthma and other chronic lower respiratory diseases: Secondary | ICD-10-CM

## 2018-08-16 DIAGNOSIS — Z86711 Personal history of pulmonary embolism: Secondary | ICD-10-CM

## 2018-08-16 DIAGNOSIS — R112 Nausea with vomiting, unspecified: Secondary | ICD-10-CM | POA: Diagnosis not present

## 2018-08-16 DIAGNOSIS — R110 Nausea: Secondary | ICD-10-CM | POA: Diagnosis not present

## 2018-08-16 DIAGNOSIS — R000 Tachycardia, unspecified: Secondary | ICD-10-CM | POA: Diagnosis not present

## 2018-08-16 LAB — COMPREHENSIVE METABOLIC PANEL
ALT: 19 U/L (ref 0–44)
AST: 23 U/L (ref 15–41)
Albumin: 3.9 g/dL (ref 3.5–5.0)
Alkaline Phosphatase: 81 U/L (ref 38–126)
Anion gap: 19 — ABNORMAL HIGH (ref 5–15)
BILIRUBIN TOTAL: UNDETERMINED mg/dL (ref 0.3–1.2)
BUN: 21 mg/dL (ref 8–23)
CO2: 18 mmol/L — ABNORMAL LOW (ref 22–32)
Calcium: 9.4 mg/dL (ref 8.9–10.3)
Chloride: 96 mmol/L — ABNORMAL LOW (ref 98–111)
Creatinine, Ser: 1.39 mg/dL — ABNORMAL HIGH (ref 0.61–1.24)
GFR, EST NON AFRICAN AMERICAN: 53 mL/min — AB (ref >60)
GLUCOSE: 390 mg/dL — AB (ref 70–99)
Potassium: 5.3 mmol/L — ABNORMAL HIGH (ref 3.5–5.1)
Sodium: 133 mmol/L — ABNORMAL LOW (ref 135–145)
Total Protein: 8.2 g/dL — ABNORMAL HIGH (ref 6.5–8.1)

## 2018-08-16 LAB — CBC WITH DIFFERENTIAL/PLATELET
Abs Immature Granulocytes: 0.03 10*3/uL (ref 0.00–0.07)
Basophils Absolute: 0 10*3/uL (ref 0.0–0.1)
Basophils Relative: 0 %
Eosinophils Absolute: 0.1 10*3/uL (ref 0.0–0.5)
Eosinophils Relative: 1 %
HEMATOCRIT: 46.9 % (ref 39.0–52.0)
Hemoglobin: 15.7 g/dL (ref 13.0–17.0)
Immature Granulocytes: 0 %
LYMPHS ABS: 2.1 10*3/uL (ref 0.7–4.0)
LYMPHS PCT: 20 %
MCH: 30.3 pg (ref 26.0–34.0)
MCHC: 33.5 g/dL (ref 30.0–36.0)
MCV: 90.5 fL (ref 80.0–100.0)
Monocytes Absolute: 0.6 10*3/uL (ref 0.1–1.0)
Monocytes Relative: 5 %
Neutro Abs: 7.8 10*3/uL — ABNORMAL HIGH (ref 1.7–7.7)
Neutrophils Relative %: 74 %
Platelets: 326 10*3/uL (ref 150–400)
RBC: 5.18 MIL/uL (ref 4.22–5.81)
RDW: 12.3 % (ref 11.5–15.5)
WBC: 10.6 10*3/uL — ABNORMAL HIGH (ref 4.0–10.5)
nRBC: 0 % (ref 0.0–0.2)

## 2018-08-16 LAB — HEMOGLOBIN A1C
Hgb A1c MFr Bld: 8.7 % — ABNORMAL HIGH (ref 4.8–5.6)
MEAN PLASMA GLUCOSE: 202.99 mg/dL

## 2018-08-16 LAB — TROPONIN I
Troponin I: <0.03 ng/mL (ref <0.03)
Troponin I: <0.03 ng/mL (ref <0.03)

## 2018-08-16 LAB — GLUCOSE, CAPILLARY: Glucose-Capillary: 332 mg/dL — ABNORMAL HIGH (ref 70–99)

## 2018-08-16 LAB — LIPASE, BLOOD: Lipase: 18 U/L (ref 11–51)

## 2018-08-16 MED ORDER — PROMETHAZINE HCL 25 MG/ML IJ SOLN
12.5000 mg | Freq: Once | INTRAMUSCULAR | Status: AC
  Administered 2018-08-16: 12.5 mg via INTRAVENOUS
  Filled 2018-08-16: qty 1

## 2018-08-16 MED ORDER — ASPIRIN EC 81 MG PO TBEC
81.0000 mg | DELAYED_RELEASE_TABLET | Freq: Every day | ORAL | Status: DC
  Administered 2018-08-16 – 2018-08-21 (×4): 81 mg via ORAL
  Filled 2018-08-16 (×4): qty 1

## 2018-08-16 MED ORDER — PROMETHAZINE HCL 25 MG RE SUPP
25.0000 mg | Freq: Four times a day (QID) | RECTAL | Status: DC | PRN
  Administered 2018-08-17: 25 mg via RECTAL
  Filled 2018-08-16 (×2): qty 1

## 2018-08-16 MED ORDER — METOPROLOL TARTRATE 25 MG PO TABS
25.0000 mg | ORAL_TABLET | Freq: Two times a day (BID) | ORAL | Status: DC
  Administered 2018-08-16 – 2018-08-21 (×7): 25 mg via ORAL
  Filled 2018-08-16 (×7): qty 1

## 2018-08-16 MED ORDER — PREDNISOLONE ACETATE 1 % OP SUSP
2.0000 [drp] | Freq: Four times a day (QID) | OPHTHALMIC | Status: DC
  Administered 2018-08-16 – 2018-08-20 (×17): 2 [drp] via OPHTHALMIC
  Filled 2018-08-16: qty 5

## 2018-08-16 MED ORDER — OFLOXACIN 0.3 % OP SOLN
2.0000 [drp] | Freq: Four times a day (QID) | OPHTHALMIC | Status: DC
  Administered 2018-08-16 – 2018-08-20 (×14): 2 [drp] via OPHTHALMIC
  Administered 2018-08-20: 3 [drp] via OPHTHALMIC
  Administered 2018-08-20 (×2): 2 [drp] via OPHTHALMIC
  Filled 2018-08-16: qty 5

## 2018-08-16 MED ORDER — APIXABAN 5 MG PO TABS
5.0000 mg | ORAL_TABLET | Freq: Two times a day (BID) | ORAL | Status: DC
  Administered 2018-08-16 – 2018-08-21 (×7): 5 mg via ORAL
  Filled 2018-08-16 (×7): qty 1

## 2018-08-16 MED ORDER — BASAGLAR KWIKPEN 100 UNIT/ML ~~LOC~~ SOPN: 40.0000 [IU] | PEN_INJECTOR | Freq: Every day | SUBCUTANEOUS | Status: DC

## 2018-08-16 MED ORDER — RANOLAZINE ER 500 MG PO TB12
500.0000 mg | ORAL_TABLET | Freq: Two times a day (BID) | ORAL | Status: DC
  Administered 2018-08-16 – 2018-08-21 (×7): 500 mg via ORAL
  Filled 2018-08-16 (×7): qty 1

## 2018-08-16 MED ORDER — ACETAMINOPHEN 325 MG PO TABS
650.0000 mg | ORAL_TABLET | Freq: Four times a day (QID) | ORAL | Status: DC | PRN
  Administered 2018-08-18 – 2018-08-21 (×4): 650 mg via ORAL
  Filled 2018-08-16 (×4): qty 2

## 2018-08-16 MED ORDER — CLOPIDOGREL BISULFATE 75 MG PO TABS
75.0000 mg | ORAL_TABLET | Freq: Every day | ORAL | Status: DC
  Administered 2018-08-16: 75 mg via ORAL
  Filled 2018-08-16: qty 1

## 2018-08-16 MED ORDER — METOCLOPRAMIDE HCL 5 MG PO TABS
5.0000 mg | ORAL_TABLET | Freq: Three times a day (TID) | ORAL | Status: DC
  Administered 2018-08-16: 5 mg via ORAL
  Filled 2018-08-16: qty 1

## 2018-08-16 MED ORDER — HYDROCODONE-ACETAMINOPHEN 5-325 MG PO TABS: 1.0000 | ORAL_TABLET | ORAL | Status: DC | PRN

## 2018-08-16 MED ORDER — SODIUM CHLORIDE 0.9 % IV SOLN: 250.0000 mL | INTRAVENOUS | Status: DC | PRN

## 2018-08-16 MED ORDER — PROMETHAZINE HCL 25 MG PO TABS: 25.0000 mg | ORAL_TABLET | Freq: Four times a day (QID) | ORAL | Status: DC | PRN

## 2018-08-16 MED ORDER — SODIUM CHLORIDE 0.9 % IV BOLUS
500.0000 mL | Freq: Once | INTRAVENOUS | Status: AC
  Administered 2018-08-16: 500 mL via INTRAVENOUS

## 2018-08-16 MED ORDER — SODIUM CHLORIDE 0.9% FLUSH: 3.0000 mL | INTRAVENOUS | Status: DC | PRN

## 2018-08-16 MED ORDER — ACETAMINOPHEN 650 MG RE SUPP: 650.0000 mg | Freq: Four times a day (QID) | RECTAL | Status: DC | PRN

## 2018-08-16 MED ORDER — INSULIN ASPART 100 UNIT/ML ~~LOC~~ SOLN
0.0000 [IU] | Freq: Three times a day (TID) | SUBCUTANEOUS | Status: DC
  Administered 2018-08-17 (×2): 9 [IU] via SUBCUTANEOUS

## 2018-08-16 MED ORDER — MORPHINE SULFATE (PF) 4 MG/ML IV SOLN
4.0000 mg | Freq: Once | INTRAVENOUS | Status: AC
  Administered 2018-08-16: 4 mg via INTRAVENOUS
  Filled 2018-08-16: qty 1

## 2018-08-16 MED ORDER — ATORVASTATIN CALCIUM 80 MG PO TABS
80.0000 mg | ORAL_TABLET | Freq: Every day | ORAL | Status: DC
  Administered 2018-08-17 – 2018-08-20 (×4): 80 mg via ORAL
  Filled 2018-08-16 (×4): qty 1

## 2018-08-16 MED ORDER — FUROSEMIDE 20 MG PO TABS
20.0000 mg | ORAL_TABLET | Freq: Every day | ORAL | Status: DC
  Administered 2018-08-16: 20 mg via ORAL
  Filled 2018-08-16: qty 1

## 2018-08-16 MED ORDER — PANTOPRAZOLE SODIUM 20 MG PO TBEC
20.0000 mg | DELAYED_RELEASE_TABLET | Freq: Every day | ORAL | Status: DC
  Administered 2018-08-16: 20 mg via ORAL
  Filled 2018-08-16: qty 1

## 2018-08-16 MED ORDER — ONDANSETRON HCL 4 MG PO TABS: 4.0000 mg | ORAL_TABLET | Freq: Four times a day (QID) | ORAL | Status: DC | PRN

## 2018-08-16 MED ORDER — GABAPENTIN 300 MG PO CAPS
300.0000 mg | ORAL_CAPSULE | Freq: Four times a day (QID) | ORAL | Status: DC
  Administered 2018-08-16 – 2018-08-21 (×14): 300 mg via ORAL
  Filled 2018-08-16 (×14): qty 1

## 2018-08-16 MED ORDER — AMLODIPINE BESYLATE 10 MG PO TABS
10.0000 mg | ORAL_TABLET | Freq: Every day | ORAL | Status: DC
  Administered 2018-08-16 – 2018-08-21 (×4): 10 mg via ORAL
  Filled 2018-08-16 (×4): qty 1

## 2018-08-16 MED ORDER — INSULIN GLARGINE 100 UNIT/ML ~~LOC~~ SOLN
40.0000 [IU] | Freq: Every day | SUBCUTANEOUS | Status: DC
  Administered 2018-08-16 – 2018-08-21 (×6): 40 [IU] via SUBCUTANEOUS
  Filled 2018-08-16 (×6): qty 0.4

## 2018-08-16 MED ORDER — ONDANSETRON HCL 4 MG/2ML IJ SOLN
4.0000 mg | Freq: Four times a day (QID) | INTRAMUSCULAR | Status: DC | PRN
  Administered 2018-08-17 – 2018-08-18 (×4): 4 mg via INTRAVENOUS
  Filled 2018-08-16 (×4): qty 2

## 2018-08-16 MED ORDER — SODIUM CHLORIDE 0.9% FLUSH
3.0000 mL | Freq: Two times a day (BID) | INTRAVENOUS | Status: DC
  Administered 2018-08-16 – 2018-08-20 (×8): 3 mL via INTRAVENOUS

## 2018-08-16 MED ORDER — ONDANSETRON HCL 4 MG/2ML IJ SOLN
4.0000 mg | Freq: Once | INTRAMUSCULAR | Status: AC
  Administered 2018-08-16: 4 mg via INTRAVENOUS
  Filled 2018-08-16: qty 2

## 2018-08-16 MED ORDER — FAMOTIDINE 20 MG PO TABS
20.0000 mg | ORAL_TABLET | Freq: Every day | ORAL | Status: DC
  Administered 2018-08-16: 20 mg via ORAL
  Filled 2018-08-16: qty 1

## 2018-08-16 MED ORDER — FERROUS SULFATE 325 (65 FE) MG PO TABS
325.0000 mg | ORAL_TABLET | Freq: Every evening | ORAL | Status: DC
  Administered 2018-08-17 – 2018-08-20 (×4): 325 mg via ORAL
  Filled 2018-08-16 (×4): qty 1

## 2018-08-16 MED ORDER — NITROGLYCERIN 0.4 MG SL SUBL: 0.4000 mg | SUBLINGUAL_TABLET | SUBLINGUAL | Status: DC | PRN

## 2018-08-16 MED ORDER — INSULIN ASPART 100 UNIT/ML ~~LOC~~ SOLN
0.0000 [IU] | Freq: Every day | SUBCUTANEOUS | Status: DC
  Administered 2018-08-16: 4 [IU] via SUBCUTANEOUS

## 2018-08-16 NOTE — ED Triage Notes (Signed)
Pt has been having nausea and chest tightness since last night that has gotten worse. Pt had MI in 2018 and feels the same. Pt took 1 nitro at home, 1 in EMS and 324 ASA. Pt alert and oriented x4.

## 2018-08-16 NOTE — ED Provider Notes (Signed)
Emergency Department Provider Note   I have reviewed the triage vital signs and the nursing notes.   HISTORY  Chief Complaint Chest Pain and Emesis   HPI Samuel [name] is a 68 y.o. male with PMH of CAD, HLD, HTN, Gastroparesis, DM, and prior PE presents to the emergency department for evaluation of chest pain with nausea/vomiting.  Symptoms began at midnight mild.  He reports worsening symptoms over the late morning and early afternoon today.  He denies significant shortness of breath or cough.  Pain is constant, severe, central chest.  He states that his "could be" pain similar to his prior heart attacks.  He states he initially thought it might of been gastrointestinal but with worsening symptoms felt that it was not consistent with his prior GI symptoms.  He arrived by EMS and was given full dose aspirin, 2 nitro, Zofran in route. No diarrhea.   Past Medical History:  Diagnosis Date  . Anxiety   . Arthritis    "all over"   . CAD (coronary artery disease)   . Charcot's joint    "left foot"  . 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 Depression   . Gastroparesis   . GERD (gastroesophageal reflux disease)   . H/O hiatal hernia   . Hyperlipidemia   . Hypertension   . Myocardial infarction (HCC) 2017/03/27   around this date  . OSA (obstructive sleep apnea)    "not bad enough for a mask"  . Peripheral neuropathy   . Peripheral vascular disease (HCC)   . PONV (postoperative nausea and vomiting)   . Pulmonary embolism (HCC)    hx. of 2012  . Shortness of breath    exertion  . Type II diabetes mellitus [facility])     Patient Active Problem List   Diagnosis Date Noted  . Chest pain 08/16/2018  . DKA, type 2 (HCC) 02/03/2018  . DOE (dyspnea on exertion) 01/24/2018  . Uncontrolled diabetes mellitus (HCC) 09/23/2017  . DKA, type 2, not at goal [facility]) 09/22/2017  . S/P BKA (below knee amputation) (HCC) 04/19/2017  . COPD (chronic obstructive pulmonary  disease) (HCC) 04/19/2017  . NSTEMI (non-ST elevated myocardial infarction) (HCC) 04/07/2017  . Osteomyelitis of foot (HCC)   . Protein-calorie malnutrition, severe (HCC) 03/08/2015  . Osteomyelitis of foot, acute (HCC) 03/07/2015  . Diabetes mellitus type 2, uncontrolled (HCC) 03/07/2015  . Normocytic normochromic anemia 03/07/2015  . Obesity (BMI 30-39.9) 03/07/2015  . Nausea & vomiting   . DM (diabetes mellitus), type 2 (HCC) 01/07/2014  . Chronic osteomyelitis involving lower leg (HCC) 01/07/2014  . Vomiting 12/22/2013  . Gastroparesis 12/21/2013  . Intractable nausea and vomiting 12/21/2013  . Essential hypertension 05/01/2013  . Hyperlipidemia due to type 2 diabetes mellitus (HCC) 05/01/2013  . Generalized weakness 03/20/2013  . Back pain 03/20/2013  . Pulmonary embolism (HCC) 03/27/2011  . PERFORATION OF GALLBLADDER 08/30/2010  . Ulcer of lower limb, unspecified 03/01/2010  . METHICILLIN SUSCEPTIBLE STAPH AUREUS SEPTICEMIA 01/25/2010  . Acute osteomyelitis, ankle and foot 01/25/2010  . NAUSEA AND VOMITING 01/25/2010  . GERD 11/09/2009  . CAD S/P DES PCI to mLAD: Xience DES 2.75 x 15 (3.0 mm) 05/01/2009  . DIABETES, TYPE 1 05/14/2008  . OBSTRUCTIVE SLEEP APNEA 04/30/2008  . ALLERGIC RHINITIS, SEASONAL 04/30/2008    Past Surgical History:  Procedure Laterality Date  . AMPUTATION Left 03/08/2015   Procedure:  LEFT AMPUTATION BELOW KNEE;  Surgeon: Toni Arthurs, MD;  Location:  [facility] ORS;  Service: Orthopedics;  Laterality: Left;  . APPLICATION OF WOUND VAC Left 01/07/2014  . CHOLECYSTECTOMY  06/2010  . CORONARY ANGIOPLASTY WITH STENT PLACEMENT  05/2009   "1"  . FOOT SURGERY Left 2010   "for Charcot's joint"  . HARDWARE REMOVAL Left 01/07/2014   Procedure: LEFT LEG REMOVAL OF DEEP IMPLANT AND SEQUESTRECTOMY; APPLICATION OF WOUND VAC ;  Surgeon: Toni Arthurs, MD;  Location: [facility] OR;  Service: Orthopedics;  Laterality: Left;  . I&D EXTREMITY Left "multiple"   leg  . I&D EXTREMITY  Left 01/07/2014   Procedure: 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;  Surgeon: Toni Arthurs, MD;  Location: [facility] OR;  Service: Orthopedics;  Laterality: Left;  . IM NAILING TIBIA Left ~ 2012  . LEFT HEART CATH AND CORONARY ANGIOGRAPHY N/A 04/08/2017   Procedure: LEFT HEART CATH AND CORONARY ANGIOGRAPHY;  Surgeon: Runell Gess, MD;  Location: [facility] INVASIVE CV LAB;  Service: Cardiovascular;  Laterality: N/A;  . SHOULDER ARTHROSCOPY W/ ROTATOR CUFF REPAIR Left    "and bone spurs"  . TIBIAL IM ROD REMOVAL Left 01/07/2014  . TOE AMPUTATION Right ~ 2011   "great toe"  . VENA CAVA FILTER PLACEMENT  2012  . VENA CAVA FILTER PLACEMENT N/A 11/20/2016   Procedure: INSERTION VENA-CAVA FILTER;  Surgeon: Sherren Kerns, MD;  Location: [facility] OR;  Service: Vascular;  Laterality: N/A;  . WOUND DEBRIDEMENT Left 01/07/2014   "tibia"    Allergies Nabumetone; Reglan [metoclopramide]; and Codeine  Family History  Problem Relation Age of Onset  . COPD Mother   . Heart disease Mother   . Lung cancer Mother   . Retinal detachment Father   . Alcoholism Brother   . Heart disease Brother   . COPD Brother   . Diabetes Brother   . Hypertension Brother   . Stroke Brother     Social History Social History   Tobacco Use  . Smoking status: Never Smoker  . Smokeless tobacco: Never Used  Substance Use Topics  . Alcohol use: No    Frequency: Never    Comment: 01/07/2014 'might have a wine cooler a couple times/yr"  . Drug use: No    Review of Systems  Constitutional: No fever/chills Eyes: No visual changes. ENT: No sore throat. Cardiovascular: Positive chest pain. Respiratory: Denies shortness of breath. Gastrointestinal: Positive epigastric abdominal pain. Positive nausea, no vomiting.  No diarrhea.  No constipation. Genitourinary: Negative for dysuria. Musculoskeletal: Negative for back pain. Skin: Negative for rash. Neurological: Negative for headaches, focal weakness or  numbness.  10-point ROS otherwise negative.  ____________________________________________   PHYSICAL EXAM:  VITAL SIGNS: Vitals:   08/16/18 1735 08/16/18 1803  BP: (!) 145/91   Pulse: 98   Resp: (!) 24   Temp:  98 F (36.7 C)  SpO2: 96%      Constitutional: Alert and oriented. Well appearing and in no acute distress. Eyes: Conjunctivae on the left with subconjunctival hemorrhage. PERRL. EOMI. Head: Atraumatic. Nose: No congestion/rhinnorhea. Mouth/Throat: Mucous membranes are moist.  Neck: No stridor.   Cardiovascular: Normal rate, regular rhythm. Good peripheral circulation. Grossly normal heart sounds.   Respiratory: Normal respiratory effort.  No retractions. Lungs CTAB. Gastrointestinal: Soft and nontender. No distention.  Musculoskeletal: No lower extremity tenderness nor edema. No gross deformities of extremities. Neurologic:  Normal speech and language. No gross focal neurologic deficits are appreciated.  Skin:  Skin is warm, dry and intact. No rash noted.  ____________________________________________   LABS (all labs  ordered are listed, but only abnormal results are displayed)  Labs Reviewed  COMPREHENSIVE METABOLIC PANEL - Abnormal; Notable for the following components:      Result Value   Sodium 133 (*)    Potassium 5.3 (*)    Chloride 96 (*)    CO2 18 (*)    Glucose, Bld 390 (*)    Creatinine, Ser 1.39 (*)    Total Protein 8.2 (*)    GFR calc non Af Amer 53 (*)    Anion gap 19 (*)    All other components within normal limits  CBC WITH DIFFERENTIAL/PLATELET - Abnormal; Notable for the following components:   WBC 10.6 (*)    Neutro Abs 7.8 (*)    All other components within normal limits  LIPASE, BLOOD  TROPONIN I   ____________________________________________  EKG   EKG Interpretation  Date/Time:  Saturday August 16 2018 15:39:09 EST Ventricular Rate:  110 PR Interval:    QRS Duration: 92 QT Interval:  348 QTC Calculation: 471 R  Axis:   5 Text Interpretation:  Sinus tachycardia Minimal ST elevation, inferior leads No STEMI. Similar ST changes on prior tracings.  Confirmed by Alona Bene [phone]) on 08/16/2018 4:02:04 PM       ____________________________________________  RADIOLOGY  Dg Chest Portable 1 View  Result Date: 08/16/2018 CLINICAL DATA:  Center chest pain and SOB since this AM. Hx of HTN, DM, CAD, SOB, Pulmonary embolism, OSA, MI. EXAM: PORTABLE CHEST 1 VIEW COMPARISON:  02/03/2018 FINDINGS: Shallow lung inflation. Heart size is normal. Lungs are free of focal consolidations and pleural effusions. No pulmonary edema. IMPRESSION: No active disease. Electronically Signed   By: Norva Pavlov M.D.   On: 08/16/2018 16:12    ____________________________________________   PROCEDURES  Procedure(s) performed:   Procedures  None ____________________________________________   INITIAL IMPRESSION / ASSESSMENT AND PLAN / ED COURSE  Pertinent labs & imaging results that were available during my care of the patient were reviewed by me and considered in my medical decision making (see chart for details).  Patient presents to the emergency department for evaluation of chest pain.  States it feels unlike his prior GI symptoms.  Patient does have history of CAD and PE but is on anticoagulation.  EKG shows ST segment elevation inferiorly that is seen on prior tracings.  No new changes.  Plan for morphine here with minimal improvement with nitro.  Chest x-ray pending.  No STEMI.   Plain film and labs reviewed. No acute findings. Patient without CP currently. Plan for obs admit for troponin trending.   Discussed patient's case with Hospitalist to request admission. Patient and family (if present) updated with plan. Care transferred to Hospitalist service.  I reviewed all nursing notes, vitals, pertinent old records, EKGs, labs, imaging (as available).  ____________________________________________  FINAL  CLINICAL IMPRESSION(S) / ED DIAGNOSES  Final diagnoses:  Precordial chest pain    MEDICATIONS GIVEN DURING THIS VISIT:  Medications  morphine 4 MG/ML injection 4 mg (4 mg Intravenous Given 08/16/18 1552)  promethazine (PHENERGAN) injection 12.5 mg (12.5 mg Intravenous Given 08/16/18 1553)  sodium chloride 0.9 % bolus 500 mL (500 mLs Intravenous New Bag/Given 08/16/18 1550)    Note:  This document was prepared using Dragon voice recognition software and may include unintentional dictation errors.  Alona Bene, MD Emergency Medicine    , Arlyss Repress, MD 08/16/18 1900

## 2018-08-16 NOTE — ED Notes (Signed)
ED TO INPATIENT HANDOFF REPORT  ED Nurse Name and Phone #: Rae Halsted [phone]  S Name/Age/Gender Samuel [name] 68 y.o. male Room/Bed: 047C/047C  Code Status   Code Status: Prior  Home/SNF/Other  Pt going to the floor for admission  AO x 4 Is this baseline? yes  Triage Complete: Triage complete  Chief Complaint Chest Pain   Triage Note Pt has been having nausea and chest tightness since last night that has gotten worse. Pt had MI in 2018 and feels the same. Pt took 1 nitro at home, 1 in EMS and 324 ASA. Pt alert and oriented x4.    Allergies Allergies  Allergen Reactions  . Nabumetone Itching, Nausea Only and Rash  . Reglan [Metoclopramide] Hives and Nausea Only  . Codeine Other (See Comments)    GI UPSET - hives    Level of Care/Admitting Diagnosis ED Disposition    ED Disposition Condition Comment   Admit  [facility]ospital [facility] [[id number]]  Level of Care: Cardiac Telemetry [103]  I expect the patient will be discharged within 24 hours: No (not a candidate for 5C-Observation unit)  Diagnosis: Chest pain [722575]  Admitting Physician: Sharyon Medicus, ALI Bai.Lain  Attending Physician: Sharyon Medicus, ALI Bai.Lain  PT Class (Do Not Modify): Observation [104]  PT Acc Code (Do Not Modify): Observation [10022]

## 2018-08-16 NOTE — H&P (Addendum)
[facility]                                                                                    Patient Demographics  Samuel [name], is a 68 y.o. male  CSN: [id number]  MRN: [id number]  DOB - 1951-01-17  Admit Date - 08/16/2018  Outpatient Primary MD for the patient is [name], MD   With History of -  Past Medical History:  Diagnosis Date  . Anxiety   . Arthritis    "all over"   . CAD (coronary artery disease)   . Charcot's joint    "left foot"  . 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 Depression   . Gastroparesis   . GERD (gastroesophageal reflux disease)   . H/O hiatal hernia   . Hyperlipidemia   . Hypertension   . Myocardial infarction (HCC) 2017/03/27   around this date  . OSA (obstructive sleep apnea)    "not bad enough for a mask"  . Peripheral neuropathy   . Peripheral vascular disease (HCC)   . PONV (postoperative nausea and vomiting)   . Pulmonary embolism (HCC)    hx. of 2012  . Shortness of breath    exertion  . Type II diabetes mellitus (HCC)       Past Surgical History:  Procedure Laterality Date  . AMPUTATION Left 03/08/2015   Procedure:  LEFT AMPUTATION BELOW KNEE;  Surgeon: Toni Arthurs, MD;  Location: [facility];  Service: Orthopedics;  Laterality: Left;  . APPLICATION OF WOUND VAC Left 01/07/2014  . CHOLECYSTECTOMY  06/2010  . CORONARY ANGIOPLASTY WITH STENT PLACEMENT  05/2009   "1"  . FOOT SURGERY Left 2010   "for Charcot's joint"  . HARDWARE REMOVAL Left 01/07/2014   Procedure: LEFT LEG REMOVAL OF DEEP IMPLANT AND SEQUESTRECTOMY; APPLICATION OF WOUND VAC ;  Surgeon: Toni Arthurs, MD;  Location: [facility] OR;  Service: Orthopedics;  Laterality: Left;  . I&D EXTREMITY Left "multiple"   leg  . 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 IM NAILING TIBIA Left ~ 2012  . LEFT HEART  CATH AND CORONARY ANGIOGRAPHY N/A 04/08/2017   Procedure: LEFT HEART CATH AND CORONARY ANGIOGRAPHY;  Surgeon: Runell Gess, MD;  Location: [facility] INVASIVE [facility];  Service: Cardiovascular;  Laterality: N/A;  . SHOULDER ARTHROSCOPY W/ ROTATOR CUFF REPAIR Left    "and bone spurs"  . TIBIAL IM ROD REMOVAL Left 01/07/2014  . TOE AMPUTATION Right ~ 2011   "great toe"  . VENA CAVA FILTER PLACEMENT  2012  . VENA CAVA FILTER PLACEMENT N/A 11/20/2016   Procedure: INSERTION VENA-CAVA FILTER;  Surgeon: Sherren Kerns, MD;  Location: [facility] OR;  Service: Vascular;  Laterality: N/A;  . WOUND DEBRIDEMENT Left 01/07/2014   "tibia"    in for   Chief Complaint  Patient presents with  . Chest Pain  . Emesis     HPI  Samuel [name]  is a 68 y.o. male, with past medical history significant for CAD, diabetes  mellitus type 2 depression and gastroparesis presenting with a chest pain episode that started today early in a.m. associated with nausea, cold sweats and shortness of breath.  The episode lasted until he came to the emergency room and he received morphine and Phenergan.  Patient reports a similar episode yesterday that he attributed to his gastroparesis.  Patient reports that this pain is similar to his prior heart attacks.  Patient received also aspirin, nitro and Zofran today by EMS prior to arrival to the ER which eased the pain significantly.  At this time the patient is chest pain-free. In the emergency room work-up included an EKG with chronic ST elevations concave upwards in the inferior leads.  Troponin was negative    Review of Systems    In addition to the HPI above, No Fever-chills, No Headache, No changes with Vision or hearing, No problems swallowing food or Liquids, No Chest pain, No Abdominal pain, No  Vommitting, Bowel movements are regular, No Blood in stool or Urine, No dysuria, No new skin rashes or bruises, No new joints pains-aches,  No new weakness, tingling, numbness in any  extremity, No recent weight gain or loss, No polyuria, polydypsia or polyphagia,   A full 10 point Review of Systems was done, except as stated above, all other Review of Systems were negative.   Social History Social History   Tobacco Use  . Smoking status: Never Smoker  . Smokeless tobacco: Never Used  Substance Use Topics  . Alcohol use: No    Frequency: Never    Comment: 01/07/2014 'might have a wine cooler a couple times/yr"     Family History Family History  Problem Relation Age of Onset  . COPD Mother   . Heart disease Mother   . Lung cancer Mother   . Retinal detachment Father   . Alcoholism Brother   . Heart disease Brother   . COPD Brother   . Diabetes Brother   . Hypertension Brother   . Stroke Brother      Prior to Admission medications   Medication Sig Start Date End Date Taking? Authorizing Provider  amLODipine (NORVASC) 10 MG tablet Take 1 tablet (10 mg total) by mouth daily. 11/22/16  Yes Richarda Overlie, MD  apixaban (ELIQUIS) 5 MG TABS tablet Take 1 tablet (5 mg total) by mouth 2 (two) times daily. 09/24/17  Yes Osvaldo Shipper, MD  aspirin 81 MG EC tablet Take 1 tablet (81 mg total) by mouth daily. 04/12/17  Yes Glade Lloyd, MD  atorvastatin (LIPITOR) 80 MG tablet Take 1 tablet (80 mg total) by mouth daily at 6 PM. 04/11/17  Yes Hanley Ben, Kshitiz, MD  clopidogrel (PLAVIX) 75 MG tablet Take 1 tablet (75 mg total) by mouth daily. 04/12/17  Yes Glade Lloyd, MD  ferrous sulfate 325 (65 FE) MG tablet Take 325 mg by mouth every evening.    Yes [provider]  furosemide (LASIX) 20 MG tablet Take 1 tablet (20 mg total) by mouth daily. Patient taking differently: Take 20 mg by mouth daily as needed for fluid.  04/12/17  Yes Glade Lloyd, MD  gabapentin (NEURONTIN) 300 MG capsule Take 300 mg by mouth 4 (four) times daily.    Yes [provider]  Insulin Glargine (BASAGLAR KWIKPEN) 100 UNIT/ML SOPN Inject 40 Units into the skin daily.   Yes  Averneni, Carmon Sails, MD  insulin lispro (HUMALOG) 100 UNIT/ML injection Inject 18 Units into the skin 3 (three) times daily with meals.    Yes  Marlene Lard, MD  lansoprazole (PREVACID) 30 MG capsule Take 30 mg by mouth 2 (two) times daily. 03/19/17  Yes [provider]  metFORMIN (GLUCOPHAGE) 500 MG tablet Take 500 mg by mouth 2 (two) times daily with a meal.   Yes [provider]  metoCLOPramide (REGLAN) 5 MG tablet Take 1 tablet (5 mg total) by mouth 3 (three) times daily. 01/02/18 08/16/18 Yes Noralee Stain, DO  metoprolol tartrate (LOPRESSOR) 25 MG tablet Take 1 tablet (25 mg total) by mouth 2 (two) times daily. 04/11/17  Yes Glade Lloyd, MD  nitroGLYCERIN (NITROSTAT) 0.4 MG SL tablet Place 1 tablet (0.4 mg total) under the tongue every 5 (five) minutes as needed for chest pain. 04/11/17  Yes Glade Lloyd, MD  ofloxacin (OCUFLOX) 0.3 % ophthalmic solution Place 2-3 drops into both eyes 4 (four) times daily. 07/21/18  Yes [provider]  ondansetron (ZOFRAN ODT) 4 MG disintegrating tablet Take 1 tablet (4 mg total) by mouth every 8 (eight) hours as needed for nausea or vomiting. 06/01/18  Yes Fawze, Mina A, PA-C  prednisoLONE acetate (PRED FORTE) 1 % ophthalmic suspension Place 2-3 drops into both eyes 4 (four) times daily. 07/21/18  Yes [provider]  promethazine (PHENERGAN) 25 MG suppository Place 1 suppository (25 mg total) rectally every 6 (six) hours as needed for nausea or vomiting. 04/18/18  Yes Long, Arlyss Repress, MD  promethazine (PHENERGAN) 25 MG tablet Take 25 mg by mouth every 6 (six) hours as needed for nausea or vomiting.  05/05/18  Yes [provider]  promethazine (PHENERGAN) 25 MG/ML injection Inject 25 mg into the muscle every 6 (six) hours as needed for nausea or vomiting.  04/22/18  Yes [provider]  ranitidine (ZANTAC) 150 MG tablet Take 150 mg by mouth 2 (two) times daily.    Yes [provider]  ranolazine  (RANEXA) 500 MG 12 hr tablet Take 1 tablet (500 mg total) by mouth 2 (two) times daily. 04/11/17  Yes Glade Lloyd, MD  traMADol (ULTRAM) 50 MG tablet Take 100 mg by mouth every 6 (six) hours as needed for moderate pain.    Yes [provider]  [facility] VERIO test strip Use to test blood sugar twice daily 09/24/17   [provider]    Allergies  Allergen Reactions  . Nabumetone Itching, Nausea Only and Rash  . Reglan [Metoclopramide] Hives and Nausea Only  . Codeine Other (See Comments)    GI UPSET - hives    Physical Exam  Vitals  Blood pressure (!) 145/91, pulse 98, temperature 98 F (36.7 C), temperature source Oral, resp. rate (!) 24, height  (1.905 m), weight 104.3 kg, SpO2 96 %.   1. General chronically ill, extremely pleasant in no acute distress  2. Normal affect and insight, Not Suicidal or Homicidal, Awake Alert, Oriented X 3.  3. No F.N deficits, grossly, patient moving all extremities.  4.  left subconjunctival hemorrhage, PERRLA. Moist Oral Mucosa.  5. Supple Neck, No JVD, No cervical lymphadenopathy appriciated, No Carotid Bruits.  6. Symmetrical Chest wall movement, Good air movement bilaterally, CTAB.  7. RRR, No Gallops, Rubs or Murmurs, No Parasternal Heave.  8. Positive Bowel Sounds, Abdomen Soft, Non tender, No organomegaly appriciated,No rebound -guarding or rigidity.  9.  No Cyanosis, Normal Skin Turgor, all scabs noted on the right leg  10. Good muscle tone, status post left below-knee amputation    Data Review  CBC Recent Labs  Lab 08/16/18 1540  WBC 10.6*  HGB 15.7  HCT 46.9  PLT 326  MCV 90.5  MCH 30.3  MCHC 33.5  RDW 12.3  LYMPHSABS 2.1  MONOABS 0.6  EOSABS 0.1  BASOSABS 0.0   ------------------------------------------------------------------------------------------------------------------  Chemistries  Recent Labs  Lab 08/16/18 1540  NA 133*  K 5.3*  CL 96*  CO2 18*  GLUCOSE 390*  BUN 21   CREATININE 1.39*  CALCIUM 9.4  AST 23  ALT 19  ALKPHOS 81  BILITOT QUANTITY NOT SUFFICIENT, UNABLE TO PERFORM TEST   ------------------------------------------------------------------------------------------------------------------ estimated creatinine clearance is 69.2 mL/min (A) (by C-G formula based on SCr of 1.39 mg/dL (H)). ------------------------------------------------------------------------------------------------------------------ No results for input(s): TSH, T4TOTAL, T3FREE, THYROIDAB in the last 72 hours.  Invalid input(s): FREET3   Coagulation profile No results for input(s): INR, PROTIME in the last 168 hours. ------------------------------------------------------------------------------------------------------------------- No results for input(s): DDIMER in the last 72 hours. -------------------------------------------------------------------------------------------------------------------  Cardiac Enzymes Recent Labs  Lab 08/16/18 1540  TROPONINI <0.03   ------------------------------------------------------------------------------------------------------------------ Invalid input(s): POCBNP   ---------------------------------------------------------------------------------------------------------------  Urinalysis    Component Value Date/Time   COLORURINE STRAW (A) 06/01/2018 1442   APPEARANCEUR CLEAR 06/01/2018 1442   LABSPEC 1.012 06/01/2018 1442   PHURINE 8.0 06/01/2018 1442   GLUCOSEU 150 (A) 06/01/2018 1442   HGBUR NEGATIVE 06/01/2018 1442   BILIRUBINUR NEGATIVE 06/01/2018 1442   KETONESUR 5 (A) 06/01/2018 1442   PROTEINUR NEGATIVE 06/01/2018 1442   UROBILINOGEN 1.0 04/05/2015 1155   NITRITE NEGATIVE 06/01/2018 1442   LEUKOCYTESUR NEGATIVE 06/01/2018 1442    ----------------------------------------------------------------------------------------------------------------   Imaging results:   Dg Chest Portable 1 View  Result Date:  08/16/2018 CLINICAL DATA:  Center chest pain and SOB since this AM. Hx of HTN, DM, CAD, SOB, Pulmonary embolism, OSA, MI. EXAM: PORTABLE CHEST 1 VIEW COMPARISON:  02/03/2018 FINDINGS: Shallow lung inflation. Heart size is normal. Lungs are free of focal consolidations and pleural effusions. No pulmonary edema. IMPRESSION: No active disease. Electronically Signed   By: Norva Pavlov M.D.   On: 08/16/2018 16:12    My personal review of EKG: Sinus tach at 110 bpm with ST elevations in inferior leads suggestive of early repolarization  Assessment & Plan  Chest pain, no significant EKG changes noted, troponin negative Serial troponins Follow EKG in a.m.  History of CAD status post MI in the past Continue with Lipitor and Plavix  Hyperlipidemia Continue with Lipitor  History of pulmonary embolism Continue with Eliquis  Left subconjunctival hemorrhage Continue with prednisolone and ofloxacin ointments  Diabetes mellitus type 2 Continue with carb modified diet Insulin sliding scale/insulin glargine  Chronic diastolic congestive heart failure Echocardiogram in 11/2016 showing ejection fraction 60 to 65% with grade 1 diastolic dysfunction Continue with Lasix    DVT Prophylaxis Eliquis  AM Labs Ordered, also please review Full Orders  Family Communication: Admission, patients condition and plan of care including tests being ordered have been discussed with the patient and wife who indicate understanding and agree with the plan and Code Status.  Code Status DNR  Disposition Plan: Home  Time spent in minutes : 42 minutes  Condition GUARDED   @

## 2018-08-17 ENCOUNTER — Other Ambulatory Visit: Payer: Self-pay

## 2018-08-17 ENCOUNTER — Observation Stay (HOSPITAL_COMMUNITY): Payer: PPO

## 2018-08-17 ENCOUNTER — Encounter (HOSPITAL_COMMUNITY): Payer: Self-pay | Admitting: *Deleted

## 2018-08-17 DIAGNOSIS — E785 Hyperlipidemia, unspecified: Secondary | ICD-10-CM | POA: Diagnosis present

## 2018-08-17 DIAGNOSIS — R072 Precordial pain: Secondary | ICD-10-CM | POA: Diagnosis not present

## 2018-08-17 DIAGNOSIS — I5032 Chronic diastolic (congestive) heart failure: Secondary | ICD-10-CM | POA: Diagnosis present

## 2018-08-17 DIAGNOSIS — N179 Acute kidney failure, unspecified: Secondary | ICD-10-CM | POA: Diagnosis present

## 2018-08-17 DIAGNOSIS — I110 Hypertensive heart disease with heart failure: Secondary | ICD-10-CM | POA: Diagnosis present

## 2018-08-17 DIAGNOSIS — D72829 Elevated white blood cell count, unspecified: Secondary | ICD-10-CM | POA: Diagnosis present

## 2018-08-17 DIAGNOSIS — G4733 Obstructive sleep apnea (adult) (pediatric): Secondary | ICD-10-CM | POA: Diagnosis present

## 2018-08-17 DIAGNOSIS — E1151 Type 2 diabetes mellitus with diabetic peripheral angiopathy without gangrene: Secondary | ICD-10-CM | POA: Diagnosis present

## 2018-08-17 DIAGNOSIS — E1165 Type 2 diabetes mellitus with hyperglycemia: Secondary | ICD-10-CM | POA: Diagnosis present

## 2018-08-17 DIAGNOSIS — R197 Diarrhea, unspecified: Secondary | ICD-10-CM | POA: Diagnosis present

## 2018-08-17 DIAGNOSIS — R079 Chest pain, unspecified: Secondary | ICD-10-CM | POA: Diagnosis present

## 2018-08-17 DIAGNOSIS — H59312 Postprocedural hemorrhage and hematoma of left eye and adnexa following an ophthalmic procedure: Secondary | ICD-10-CM | POA: Diagnosis present

## 2018-08-17 DIAGNOSIS — R109 Unspecified abdominal pain: Secondary | ICD-10-CM | POA: Diagnosis not present

## 2018-08-17 DIAGNOSIS — E1143 Type 2 diabetes mellitus with diabetic autonomic (poly)neuropathy: Secondary | ICD-10-CM | POA: Diagnosis present

## 2018-08-17 DIAGNOSIS — R112 Nausea with vomiting, unspecified: Secondary | ICD-10-CM

## 2018-08-17 DIAGNOSIS — K3184 Gastroparesis: Secondary | ICD-10-CM | POA: Diagnosis present

## 2018-08-17 DIAGNOSIS — Z7902 Long term (current) use of antithrombotics/antiplatelets: Secondary | ICD-10-CM | POA: Diagnosis not present

## 2018-08-17 DIAGNOSIS — R0602 Shortness of breath: Secondary | ICD-10-CM | POA: Diagnosis present

## 2018-08-17 DIAGNOSIS — Z7901 Long term (current) use of anticoagulants: Secondary | ICD-10-CM | POA: Diagnosis not present

## 2018-08-17 DIAGNOSIS — Z7982 Long term (current) use of aspirin: Secondary | ICD-10-CM | POA: Diagnosis not present

## 2018-08-17 DIAGNOSIS — Z79899 Other long term (current) drug therapy: Secondary | ICD-10-CM | POA: Diagnosis not present

## 2018-08-17 DIAGNOSIS — I2510 Atherosclerotic heart disease of native coronary artery without angina pectoris: Secondary | ICD-10-CM | POA: Diagnosis present

## 2018-08-17 DIAGNOSIS — Z89411 Acquired absence of right great toe: Secondary | ICD-10-CM | POA: Diagnosis not present

## 2018-08-17 DIAGNOSIS — E11610 Type 2 diabetes mellitus with diabetic neuropathic arthropathy: Secondary | ICD-10-CM | POA: Diagnosis present

## 2018-08-17 DIAGNOSIS — E876 Hypokalemia: Secondary | ICD-10-CM | POA: Diagnosis not present

## 2018-08-17 DIAGNOSIS — I252 Old myocardial infarction: Secondary | ICD-10-CM | POA: Diagnosis not present

## 2018-08-17 DIAGNOSIS — Z66 Do not resuscitate: Secondary | ICD-10-CM | POA: Diagnosis present

## 2018-08-17 DIAGNOSIS — K219 Gastro-esophageal reflux disease without esophagitis: Secondary | ICD-10-CM | POA: Diagnosis present

## 2018-08-17 LAB — LIPASE, BLOOD: Lipase: 16 U/L (ref 11–51)

## 2018-08-17 LAB — GLUCOSE, CAPILLARY
Glucose-Capillary: 375 mg/dL — ABNORMAL HIGH (ref 70–99)
Glucose-Capillary: 431 mg/dL — ABNORMAL HIGH (ref 70–99)
Glucose-Capillary: 350 mg/dL — ABNORMAL HIGH (ref 70–99)
Glucose-Capillary: 315 mg/dL — ABNORMAL HIGH (ref 70–99)

## 2018-08-17 LAB — LIPID PANEL
Cholesterol: 168 mg/dL (ref 0–200)
HDL: 28 mg/dL — ABNORMAL LOW (ref >40)
LDL Cholesterol: 94 mg/dL (ref 0–99)
Total CHOL/HDL Ratio: 6 RATIO
Triglycerides: 230 mg/dL — ABNORMAL HIGH (ref <150)
VLDL: 46 mg/dL — ABNORMAL HIGH (ref 0–40)

## 2018-08-17 LAB — CBC
HCT: 45.5 % (ref 39.0–52.0)
Hemoglobin: 15.5 g/dL (ref 13.0–17.0)
MCH: 30.4 pg (ref 26.0–34.0)
MCHC: 34.1 g/dL (ref 30.0–36.0)
MCV: 89.2 fL (ref 80.0–100.0)
Platelets: 283 10*3/uL (ref 150–400)
RBC: 5.1 MIL/uL (ref 4.22–5.81)
RDW: 12.2 % (ref 11.5–15.5)
WBC: 16.6 10*3/uL — ABNORMAL HIGH (ref 4.0–10.5)
nRBC: 0 % (ref 0.0–0.2)

## 2018-08-17 LAB — HEPATIC FUNCTION PANEL
ALT: 20 U/L (ref 0–44)
AST: 22 U/L (ref 15–41)
Albumin: 3.7 g/dL (ref 3.5–5.0)
Alkaline Phosphatase: 79 U/L (ref 38–126)
BILIRUBIN INDIRECT: 1.2 mg/dL — AB (ref 0.3–0.9)
Bilirubin, Direct: 0.3 mg/dL — ABNORMAL HIGH (ref 0.0–0.2)
TOTAL PROTEIN: 8.1 g/dL (ref 6.5–8.1)
Total Bilirubin: 1.5 mg/dL — ABNORMAL HIGH (ref 0.3–1.2)

## 2018-08-17 LAB — BASIC METABOLIC PANEL
Anion gap: 16 — ABNORMAL HIGH (ref 5–15)
BUN: 27 mg/dL — ABNORMAL HIGH (ref 8–23)
CALCIUM: 9.2 mg/dL (ref 8.9–10.3)
CO2: 22 mmol/L (ref 22–32)
Chloride: 98 mmol/L (ref 98–111)
Creatinine, Ser: 1.3 mg/dL — ABNORMAL HIGH (ref 0.61–1.24)
GFR calc Af Amer: >60 mL/min (ref >60)
GFR calc non Af Amer: 57 mL/min — ABNORMAL LOW (ref >60)
GLUCOSE: 354 mg/dL — AB (ref 70–99)
POTASSIUM: 4.5 mmol/L (ref 3.5–5.1)
Sodium: 136 mmol/L (ref 135–145)

## 2018-08-17 LAB — TROPONIN I
Troponin I: <0.03 ng/mL (ref <0.03)
Troponin I: <0.03 ng/mL (ref <0.03)

## 2018-08-17 MED ORDER — IOHEXOL 300 MG/ML  SOLN
100.0000 mL | Freq: Once | INTRAMUSCULAR | Status: AC | PRN
  Administered 2018-08-17: 100 mL via INTRAVENOUS

## 2018-08-17 MED ORDER — PANTOPRAZOLE SODIUM 40 MG IV SOLR
40.0000 mg | INTRAVENOUS | Status: DC
  Administered 2018-08-18: 40 mg via INTRAVENOUS
  Filled 2018-08-17: qty 40

## 2018-08-17 MED ORDER — PROMETHAZINE HCL 25 MG/ML IJ SOLN
6.2500 mg | Freq: Four times a day (QID) | INTRAMUSCULAR | Status: DC | PRN
  Administered 2018-08-17 (×3): 6.25 mg via INTRAVENOUS
  Filled 2018-08-17 (×3): qty 1

## 2018-08-17 MED ORDER — LACTATED RINGERS IV SOLN
INTRAVENOUS | Status: AC
  Administered 2018-08-17 – 2018-08-18 (×3): via INTRAVENOUS

## 2018-08-17 MED ORDER — METOCLOPRAMIDE HCL 5 MG/ML IJ SOLN
10.0000 mg | Freq: Four times a day (QID) | INTRAMUSCULAR | Status: DC
  Administered 2018-08-17 – 2018-08-21 (×16): 10 mg via INTRAVENOUS
  Filled 2018-08-17 (×16): qty 2

## 2018-08-17 MED ORDER — PANTOPRAZOLE SODIUM 40 MG IV SOLR
40.0000 mg | INTRAVENOUS | Status: DC
  Administered 2018-08-17: 40 mg via INTRAVENOUS
  Filled 2018-08-17: qty 40

## 2018-08-17 MED ORDER — METOCLOPRAMIDE HCL 5 MG/ML IJ SOLN: 5.0000 mg | Freq: Four times a day (QID) | INTRAMUSCULAR | Status: DC

## 2018-08-17 MED ORDER — PANTOPRAZOLE SODIUM 40 MG IV SOLR: 40.0000 mg | Freq: Two times a day (BID) | INTRAVENOUS | Status: DC

## 2018-08-17 MED ORDER — FAMOTIDINE IN NACL 20-0.9 MG/50ML-% IV SOLN
20.0000 mg | Freq: Two times a day (BID) | INTRAVENOUS | Status: DC
  Filled 2018-08-17: qty 50

## 2018-08-17 MED ORDER — INSULIN ASPART 100 UNIT/ML ~~LOC~~ SOLN
0.0000 [IU] | SUBCUTANEOUS | Status: DC
  Administered 2018-08-17 (×2): 7 [IU] via SUBCUTANEOUS
  Administered 2018-08-18: 2 [IU] via SUBCUTANEOUS
  Administered 2018-08-18: 3 [IU] via SUBCUTANEOUS
  Administered 2018-08-18 (×2): 2 [IU] via SUBCUTANEOUS
  Administered 2018-08-18: 7 [IU] via SUBCUTANEOUS
  Administered 2018-08-18 – 2018-08-19 (×2): 1 [IU] via SUBCUTANEOUS
  Administered 2018-08-19 (×2): 2 [IU] via SUBCUTANEOUS
  Administered 2018-08-19 (×3): 1 [IU] via SUBCUTANEOUS
  Administered 2018-08-20: 2 [IU] via SUBCUTANEOUS
  Administered 2018-08-20 (×4): 3 [IU] via SUBCUTANEOUS
  Administered 2018-08-20: 2 [IU] via SUBCUTANEOUS
  Administered 2018-08-21: 3 [IU] via SUBCUTANEOUS
  Administered 2018-08-21 (×2): 1 [IU] via SUBCUTANEOUS

## 2018-08-17 NOTE — Progress Notes (Signed)
Patient has vomited greenish emesis intermittently throughout the night. He yelled out stating "I can't breath, I need oxygen"  O2 sats 96 % RA. Pt placed on O2 @ 2L. PRN Zofran and Phenergan given not effective. Will continue to monitor.

## 2018-08-17 NOTE — Care Management Obs Status (Signed)
MEDICARE OBSERVATION STATUS NOTIFICATION   Patient Details  Name: Samuel [name] MRN: [id number] Date of Birth: Jan 05, 1951   Medicare Observation Status Notification Given:  Yes    Darrold Span, RN 08/17/2018, 4:58 PM

## 2018-08-17 NOTE — Progress Notes (Signed)
PROGRESS NOTE    Samuel [name]  NID:[id number] DOB: Feb 02, 1951 DOA: 08/16/2018 PCP: [name], MD   Brief Narrative:  Samuel [name]  is Samuel [name] 68 y.o. male, with past medical history significant for CAD, diabetes mellitus type 2 depression and gastroparesis presenting with Samuel [name] chest pain episode that started today early in Samuel [name].m. associated with nausea, cold sweats and shortness of breath.  The episode lasted until he came to the emergency room and he received morphine and Phenergan.  Patient reports Samuel [name] similar episode yesterday that he attributed to his gastroparesis.  Patient reports that this pain is similar to his prior heart attacks.  Patient received also aspirin, nitro and Zofran today by EMS prior to arrival to the ER which eased the pain significantly.  At this time the patient is chest pain-free. In the emergency room work-up included an EKG with chronic ST elevations concave upwards in the inferior leads.  Troponin was negative  Assessment & Plan:   Active Problems:   Chest pain  Nausea  Vomiting  Abdominal Pain: suspect this is secondary to gastroparesis.  He had gastric emptying study in 2012 suggesting gastroparesis.  CT abdomen pelvis with diffuse thickening in distal esophagus, likely related to reflux disease IV PPI daily IV reglan QID Antiemetics (zofran/phenergan prn) Minimize narcotics as able Diet as tolerated, will place on clears for now Follow repeat EKG tomorrow for QT, continue tele  Chest pain  Coronary Artery Disease with hx MI  Peripheral Vascular Disease  EKG's appear similar to prior (isolated T eave inversion in V2) Troponins negative x3 Suspect 2/2 above Cardiology c/s - suspects GI.  Recommended d/c plavix as he's also on eliquis/aspirin.  Recommending continue statin, amlodipine, metoprolol. Needs follow up with Dr. Allyson [name] 2-4 weeks after discharge  Continue aspirin, lipitor, metoprolol, ranexa  Hyperlipidemia Continue with Lipitor  History of  pulmonary embolism in 2012 Continue with Eliquis He has IVC filter (needs outpatient f/u regarding longterm plan for this)  Left subconjunctival hemorrhage Continue with prednisolone and ofloxacin ointments  Diabetes mellitus type 2 Continue with carb modified diet Continue lantus Q4 SSI while unreliable PO intake A1c is 8.7   Chronic diastolic congestive heart failure Echocardiogram in 11/2016 showing ejection fraction 60 to 65% with grade 1 diastolic dysfunction Holding lasix while getting IVF with poor PO intake, nausea, vomiting Follow volume status I/O, daily weights  Leukocytosis: likely reactive, follow  DVT prophylaxis: eliquis Code Status: DNR Family Communication: none at bedside Disposition Plan: pending improvement.  Inpatient given persistent nausea, vomiting 2/2 suspected flare of gastroparesis.  Intractable requiring IV antiemetics and IV fluids.  Poor PO intake.  Unable to be discharged at this point.   Consultants:   cardiology  Procedures:   none  Antimicrobials:  none   Subjective: Difficult to take history Pt with nausea, vomiting.  Intractable. He notes this feels like gastroparesis in past, but worse.   Objective: Vitals:   08/17/18 0458 08/17/18 0846 08/17/18 0900 08/17/18 1158  BP: 136/77 (!) 142/67  126/65  Pulse: (!) 103 99    Resp: 19 (!) 25    Temp: 98.6 F (37 C)  98.8 F (37.1 C) 98.3 F (36.8 C)  TempSrc: Oral  Axillary Axillary  SpO2: 98% 99%  93%  Weight:      Height:        Intake/Output Summary (Last 24 hours) at 08/17/2018 1633 Last data filed at 08/17/2018 1500 Gross per 24 hour  Intake 878.12 ml  Output -  Net 878.12 ml   Filed Weights   08/16/18 1545 08/16/18 2051  Weight: 104.3 kg 117.9 kg    Examination:  General exam: uncomfortable, with wretching and vomiting Respiratory system: Clear to auscultation. Respiratory effort normal. Cardiovascular system: tachycardic Gastrointestinal system: Abdomen is  nondistended, mildly diffusely ttp, no rebound/guarding Central nervous system: Alert and oriented. No focal neurological deficits. Extremities: L BKA Skin: No rashes, lesions or ulcers Psychiatry: Judgement and insight appear normal. Mood & affect appropriate.     Data Reviewed: I have personally reviewed following labs and imaging studies  CBC: Recent Labs  Lab 08/16/18 1540 08/17/18 1036  WBC 10.6* 16.6*  NEUTROABS 7.8*  --   HGB 15.7 15.5  HCT 46.9 45.5  MCV 90.5 89.2  PLT 326 283   Basic Metabolic Panel: Recent Labs  Lab 08/16/18 1540 08/17/18 0316  NA 133* 136  K 5.3* 4.5  CL 96* 98  CO2 18* 22  GLUCOSE 390* 354*  BUN 21 27*  CREATININE 1.39* 1.30*  CALCIUM 9.4 9.2   GFR: Estimated Creatinine Clearance: 78.4 mL/min (Samuel [name]) (by C-G formula based on SCr of 1.3 mg/dL (H)). Liver Function Tests: Recent Labs  Lab 08/16/18 1540 08/17/18 1036  AST 23 22  ALT 19 20  ALKPHOS 81 79  BILITOT QUANTITY NOT SUFFICIENT, UNABLE TO PERFORM TEST 1.5*  PROT 8.2* 8.1  ALBUMIN 3.9 3.7   Recent Labs  Lab 08/16/18 1540 08/17/18 1036  LIPASE 18 16   No results for input(s): AMMONIA in the last 168 hours. Coagulation Profile: No results for input(s): INR, PROTIME in the last 168 hours. Cardiac Enzymes: Recent Labs  Lab 08/16/18 1540 08/16/18 2110 08/17/18 0316 08/17/18 1036  TROPONINI <0.03 <0.03 <0.03 <0.03   BNP (last 3 results) No results for input(s): PROBNP in the last 8760 hours. HbA1C: Recent Labs    08/16/18 2110  HGBA1C 8.7*   CBG: Recent Labs  Lab 08/16/18 2102 08/17/18 0728 08/17/18 1152  GLUCAP 332* 375* 431*   Lipid Profile: Recent Labs    08/17/18 1036  CHOL 168  HDL 28*  LDLCALC 94  TRIG 518*  CHOLHDL 6.0   Thyroid Function Tests: No results for input(s): TSH, T4TOTAL, FREET4, T3FREE, THYROIDAB in the last 72 hours. Anemia Panel: No results for input(s): VITAMINB12, FOLATE, FERRITIN, TIBC, IRON, RETICCTPCT in the last 72  hours. Sepsis Labs: No results for input(s): PROCALCITON, LATICACIDVEN in the last 168 hours.  No results found for this or any previous visit (from the past 240 hour(s)).       Radiology Studies: Dg Abd 1 View  Result Date: 08/17/2018 CLINICAL DATA:  Abdominal pain EXAM: ABDOMEN - 1 VIEW COMPARISON:  01/18/2018 FINDINGS: Scattered large and small bowel gas is noted. No free air is seen. No abnormal mass or abnormal calcifications are noted. IVC filter is again seen. No acute bony abnormality is noted. IMPRESSION: No acute abnormality noted. Electronically Signed   By: Samuel [name] M.D.   On: 08/17/2018 08:53   Ct Abdomen Pelvis W Contrast  Result Date: 08/17/2018 CLINICAL DATA:  Nausea and vomiting EXAM: CT ABDOMEN AND PELVIS WITH CONTRAST TECHNIQUE: Multidetector CT imaging of the abdomen and pelvis was performed using the standard protocol following bolus administration of intravenous contrast. CONTRAST:  OMNIPAQUE IOHEXOL 300 MG/ML  SOLN COMPARISON:  None. FINDINGS: Lower chest: Lung bases are free of acute infiltrate or sizable effusion. Coronary calcifications are noted. Hepatobiliary: Gallbladder has been surgically removed. The liver is fatty infiltrated. Pancreas:  Fatty infiltration of the pancreas is seen. No focal mass is noted. Spleen: Spleen is within limits. Adrenals/Urinary Tract: Adrenal glands are within normal limits. Kidneys are well visualized bilaterally. Samuel [name] 3.3 cm cyst is noted in the upper pole of the left kidney. No renal calculi or obstructive changes are noted. The bladder is partially distended. Stomach/Bowel: The appendix is within normal limits. No acute obstructive or inflammatory changes of large or small bowel is noted. Mild thickening of the distal esophagus is noted which may be related underlying reflux. Vascular/Lymphatic: IVC filter is noted. Aortic calcifications are noted without aneurysmal dilatation. No lymphadenopathy is noted. Reproductive: Prostate is  unremarkable. Other: No abdominal wall hernia or abnormality. No abdominopelvic ascites. Musculoskeletal: Degenerative changes of lumbar spine are noted. IMPRESSION: Diffuse thickening in the distal esophagus likely related to reflux disease. Chronic changes without acute abnormality. Electronically Signed   By: Samuel [name] M.D.   On: 08/17/2018 11:52   Dg Chest Portable 1 View  Result Date: 08/16/2018 CLINICAL DATA:  Center chest pain and SOB since this AM. Hx of HTN, DM, CAD, SOB, Pulmonary embolism, OSA, MI. EXAM: PORTABLE CHEST 1 VIEW COMPARISON:  02/03/2018 FINDINGS: Shallow lung inflation. Heart size is normal. Lungs are free of focal consolidations and pleural effusions. No pulmonary edema. IMPRESSION: No active disease. Electronically Signed   By: Norva Pavlov M.D.   On: 08/16/2018 16:12        Scheduled Meds: . amLODipine  10 mg Oral Daily  . apixaban  5 mg Oral BID  . aspirin EC  81 mg Oral Daily  . atorvastatin  80 mg Oral q1800  . ferrous sulfate  325 mg Oral QPM  . gabapentin  300 mg Oral QID  . insulin aspart  0-9 Units Subcutaneous Q4H  . insulin glargine  40 Units Subcutaneous Daily  . metoCLOPramide (REGLAN) injection  10 mg Intravenous Q6H  . metoprolol tartrate  25 mg Oral BID  . ofloxacin  2-3 drop Both Eyes QID  . pantoprazole (PROTONIX) IV  40 mg Intravenous Q24H  . prednisoLONE acetate  2-3 drop Both Eyes QID  . ranolazine  500 mg Oral BID  . sodium chloride flush  3 mL Intravenous Q12H   Continuous Infusions: . sodium chloride    . lactated ringers 125 mL/hr at 08/17/18 1500     LOS: 0 days    Time spent: over 30 min    Lacretia Nicks, MD [facility] Pager AMION  If 7PM-7AM, please contact night-coverage [email or web address] Password [facility] 08/17/2018, 4:33 PM

## 2018-08-17 NOTE — Discharge Instructions (Addendum)
Information on my medicine - ELIQUIS (apixaban)  Why was Eliquis prescribed for you? Eliquis was prescribed to treat blood clots that may have been found in the veins of your legs (deep vein thrombosis) or in your lungs (pulmonary embolism) and to reduce the risk of them occurring again.  What do You need to know about Eliquis ? The dose is ONE 5 mg tablet taken TWICE daily.  Eliquis may be taken with or without food.   Try to take the dose about the same time in the morning and in the evening. If you have difficulty swallowing the tablet whole please discuss with your pharmacist how to take the medication safely.  Take Eliquis exactly as prescribed and DO NOT stop taking Eliquis without talking to the doctor who prescribed the medication.  Stopping may increase your risk of developing a new blood clot.  Refill your prescription before you run out.  After discharge, you should have regular check-up appointments with your healthcare provider that is prescribing your Eliquis.    What do you do if you miss a dose? If a dose of ELIQUIS is not taken at the scheduled time, take it as soon as possible on the same day and twice-daily administration should be resumed. The dose should not be doubled to make up for a missed dose.  Important Safety Information A possible side effect of Eliquis is bleeding. You should call your healthcare provider right away if you experience any of the following: ? Bleeding from an injury or your nose that does not stop. ? Unusual colored urine (red or dark brown) or unusual colored stools (red or black). ? Unusual bruising for unknown reasons. ? A serious fall or if you hit your head (even if there is no bleeding).  Some medicines may interact with Eliquis and might increase your risk of bleeding or clotting while on Eliquis. To help avoid this, consult your healthcare provider or pharmacist prior to using any new prescription or non-prescription  medications, including herbals, vitamins, non-steroidal anti-inflammatory drugs (NSAIDs) and supplements.  This website has more information on Eliquis (apixaban): http://www.eliquis.com/eliquis/home  Gastroparesis   Gastroparesis is a condition in which food takes longer than normal to empty from the stomach. The condition is usually long-lasting (chronic). It may also be called delayed gastric emptying. There is no cure, but there are treatments and things that you can do at home to help relieve symptoms. Treating the underlying condition that causes gastroparesis can also help relieve symptoms. What are the causes? In many cases, the cause of this condition is not known. Possible causes include:  A hormone (endocrine) disorder, such as hypothyroidism or diabetes.  A nervous system disease, such as Parkinson's disease or multiple sclerosis.  Cancer, infection, or surgery that affects the stomach or vagus nerve. The vagus nerve runs from your chest, through your neck, to the lower part of your brain.  A connective tissue disorder, such as scleroderma.  Certain medicines. What increases the risk? You are more likely to develop this condition if you:  Have certain disorders or diseases, including: ? An endocrine disorder. ? An eating disorder. ? Amyloidosis. ? Scleroderma. ? Parkinson's disease. ? Multiple sclerosis. ? Cancer or infection of the stomach or the vagus nerve.  Have had surgery on the stomach or vagus nerve.  Take certain medicines.  Are male. What are the signs or symptoms? Symptoms of this condition include:  Feeling full after eating very little.  Nausea.  Vomiting.  Heartburn.  Abdominal bloating.  Inconsistent blood sugar (glucose) levels on blood tests.  Lack of appetite.  Weight loss.  Acid from the stomach coming up into the esophagus (gastroesophageal reflux).  Sudden tightening (spasm) of the stomach, which can be painful. Symptoms  may come and go. Some people may not notice any symptoms. How is this diagnosed? This condition is diagnosed with tests, such as:  Tests that check how long it takes food to move through the stomach and intestines. These tests include: ? Upper gastrointestinal (GI) series. For this test, you drink a liquid that shows up well on X-rays, and then X-rays will be taken of your intestines. ? Gastric emptying scintigraphy. For this test, you eat food that contains a small amount of radioactive material, and then scans are taken. ? Wireless capsule GI monitoring system. For this test, you swallow a pill (capsule) that records information about how foods and fluid move through your stomach.  Gastric manometry. For this test, a tube is passed down your throat and into your stomach to measure electrical and muscular activity.  Endoscopy. For this test, a long, thin tube is passed down your throat and into your stomach to check for problems in your stomach lining.  Ultrasound. This test uses sound waves to create images of inside the body. This can help rule out gallbladder disease or pancreatitis as a cause of your symptoms. How is this treated? There is no cure for gastroparesis. Treatment may include:  Treating the underlying cause.  Managing your symptoms by making changes to your diet and exercise habits.  Taking medicines to control nausea and vomiting and to stimulate stomach muscles.  Getting food through a feeding tube in the hospital. This may be done in severe cases.  Having surgery to insert a device into your body that helps improve stomach emptying and control nausea and vomiting (gastric neurostimulator). Follow these instructions at home:  Take over-the-counter and prescription medicines only as told by your health care provider.  Follow instructions from your health care provider about eating or drinking restrictions. Your health care provider may recommend that you: ? Eat  smaller meals more often. ? Eat low-fat foods. ? Eat low-fiber forms of high-fiber foods. For example, eat cooked vegetables instead of raw vegetables. ? Have only liquid foods instead of solid foods. Liquid foods are easier to digest.  Drink enough fluid to keep your urine pale yellow.  Exercise as often as told by your health care provider.  Keep all follow-up visits as told by your health care provider. This is important. Contact a health care provider if you:  Notice that your symptoms do not improve with treatment.  Have new symptoms. Get help right away if you:  Have severe abdominal pain that does not improve with treatment.  Have nausea that is severe or does not go away.  Cannot drink fluids without vomiting. Summary  Gastroparesis is a chronic condition in which food takes longer than normal to empty from the stomach.  Symptoms include nausea, vomiting, heartburn, abdominal bloating, and loss of appetite.  Eating smaller portions, and low-fat, low-fiber foods may help you manage your symptoms.  Get help right away if you have severe abdominal pain. This information is not intended to replace advice given to you by your health care provider. Make sure you discuss any questions you have with your health care provider. Document Released: 06/04/2005 Document Revised: 04/09/2017 Document Reviewed: 04/09/2017 Elsevier Interactive Patient Education  2019 [employer].

## 2018-08-17 NOTE — Consult Note (Addendum)
Cardiology Consultation:   Patient ID: Samuel [name] MRN: [id number]; DOB: 1951/06/02  Admit date: 08/16/2018 Date of Consult: 08/17/2018  Primary Care Provider: [name], MD Primary Cardiologist: Dr. Allyson Sabal   Patient Profile:   Samuel [name] is a 68 y.o. male with a hx of CAD s/p DES to LAD in 2010, PAD s/p left BKA, DM, OSA, HTN, HLD and gastroparesis who is being seen today for the evaluation of chest pain at the request of Dr. Lowell Guitar.   Last cath 04/08/17 showed a patent LAD stent , occluded dominant RCA the origin with left right collaterals and a high-grade calcified ostial circumflex stenosis. Turned down for GABG by Dr. Tyrone Sage because of comorbidities recommended medical therapy. He has done well since then.   He was doing well on cardiac stand point when last seen by Dr. Allyson Sabal 07/24/17.  History of Present Illness:   Samuel [name] presented with multiple complaints.  He has 3 days history of severe nausea and loss of appetite.  Since yesterday he had multiple episode of brownish vomiting and previous diarrhea episode.  Later he started to having upper sternal chest pressure and came to ER.  Chest x-ray without acute cardiopulmonary disease.  pending abdominal ultrasound.  Creatinine 1.39>> 1.3.  Elevated blood sugar.  Troponin has been remained negative.  EKG shows sinus rhythm with chronic inferior T wave inversion minimally, no acute changes test personally reviewed.  Hemoglobin A1c 8.7.  During my evaluation patient had loose bowel movement as well as belching.  His symptoms is not similar to prior stenting in 2010.  Might be similar when he had a cath in 2018.  Past Medical History:  Diagnosis Date  . Anxiety   . Arthritis    "all over"   . CAD (coronary artery disease)   . Charcot's joint    "left foot"  . 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 Depression   . Gastroparesis   . GERD (gastroesophageal reflux disease)   . H/O hiatal hernia   .  Hyperlipidemia   . Hypertension   . Myocardial infarction (HCC) 2017/03/27   around this date  . OSA (obstructive sleep apnea)    "not bad enough for a mask"  . Peripheral neuropathy   . Peripheral vascular disease (HCC)   . PONV (postoperative nausea and vomiting)   . Pulmonary embolism (HCC)    hx. of 2012  . Shortness of breath    exertion  . Type II diabetes mellitus (HCC)     Past Surgical History:  Procedure Laterality Date  . AMPUTATION Left 03/08/2015   Procedure:  LEFT AMPUTATION BELOW KNEE;  Surgeon: Toni Arthurs, MD;  Location: [facility];  Service: Orthopedics;  Laterality: Left;  . APPLICATION OF WOUND VAC Left 01/07/2014  . CHOLECYSTECTOMY  06/2010  . CORONARY ANGIOPLASTY WITH STENT PLACEMENT  05/2009   "1"  . FOOT SURGERY Left 2010   "for Charcot's joint"  . HARDWARE REMOVAL Left 01/07/2014   Procedure: LEFT LEG REMOVAL OF DEEP IMPLANT AND SEQUESTRECTOMY; APPLICATION OF WOUND VAC ;  Surgeon: Toni Arthurs, MD;  Location: [facility] OR;  Service: Orthopedics;  Laterality: Left;  . I&D EXTREMITY Left "multiple"   leg  . 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 IM NAILING TIBIA Left ~ 2012  . LEFT HEART CATH AND CORONARY  ANGIOGRAPHY N/A 04/08/2017   Procedure: LEFT HEART CATH AND CORONARY ANGIOGRAPHY;  Surgeon: Runell Gess, MD;  Location: [facility];  Service: Cardiovascular;  Laterality: N/A;  . SHOULDER ARTHROSCOPY W/ ROTATOR CUFF REPAIR Left    "and bone spurs"  . TIBIAL IM ROD REMOVAL Left 01/07/2014  . TOE AMPUTATION Right ~ 2011   "great toe"  . VENA CAVA FILTER PLACEMENT  2012  . VENA CAVA FILTER PLACEMENT N/A 11/20/2016   Procedure: INSERTION VENA-CAVA FILTER;  Surgeon: Sherren Kerns, MD;  Location[facility];  Service: Vascular;  Laterality: N/A;  . WOUND DEBRIDEMENT Left 01/07/2014   "tibia"     Inpatient  Medications: Scheduled Meds: . amLODipine  10 mg Oral Daily  . apixaban  5 mg Oral BID  . aspirin EC  81 mg Oral Daily  . atorvastatin  80 mg Oral q1800  . clopidogrel  75 mg Oral Daily  . famotidine  20 mg Oral Daily  . ferrous sulfate  325 mg Oral QPM  . furosemide  20 mg Oral Daily  . gabapentin  300 mg Oral QID  . insulin aspart  0-5 Units Subcutaneous QHS  . insulin aspart  0-9 Units Subcutaneous TID WC  . insulin glargine  40 Units Subcutaneous Daily  . metoCLOPramide  5 mg Oral TID  . metoprolol tartrate  25 mg Oral BID  . ofloxacin  2-3 drop Both Eyes QID  . pantoprazole  20 mg Oral Daily  . prednisoLONE acetate  2-3 drop Both Eyes QID  . ranolazine  500 mg Oral BID  . sodium chloride flush  3 mL Intravenous Q12H   Continuous Infusions: . sodium chloride     PRN Meds: sodium chloride, acetaminophen **OR** acetaminophen, HYDROcodone-acetaminophen, nitroGLYCERIN, ondansetron **OR** ondansetron (ZOFRAN) IV, promethazine, sodium chloride flush  Allergies:    Allergies  Allergen Reactions  . Nabumetone Itching, Nausea Only and Rash  . Reglan [Metoclopramide] Hives and Nausea Only  . Codeine Other (See Comments)    GI UPSET - hives    Social History:   Social History   Socioeconomic History  . Marital status: Divorced    Spouse name: Not on file  . Number of children: Not on file  . Years of education: 32  . Highest education level: Not on file  Occupational History  . Occupation: DISABLITY    Employer: UNEMPLOYED  Social Needs  . Financial resource strain: Not on file  . Food insecurity:    Worry: Not on file    Inability: Not on file  . Transportation needs:    Medical: Not on file    Non-medical: Not on file  Tobacco Use  . Smoking status: Never Smoker  . Smokeless tobacco: Never Used  Substance and Sexual Activity  . Alcohol use: No    Frequency: Never    Comment: 01/07/2014 'might have a wine cooler a couple times/yr"  . Drug use: No  . Sexual  activity: Never  Lifestyle  . Physical activity:    Days per week: Not on file    Minutes per session: Not on file  . Stress: Only a little  Relationships  . Social connections:    Talks on phone: Never    Gets together: Never    Attends religious service: Never    Active member of club or organization: No    Attends meetings of clubs or organizations: Never    Relationship status: Divorced  . Intimate partner violence:  Fear of current or ex partner: Not on file    Emotionally abused: Not on file    Physically abused: Not on file    Forced sexual activity: Not on file  Other Topics Concern  . Not on file  Social History Narrative  . Not on file    Family History:    Family History  Problem Relation Age of Onset  . COPD Mother   . Heart disease Mother   . Lung cancer Mother   . Retinal detachment Father   . Alcoholism Brother   . Heart disease Brother   . COPD Brother   . Diabetes Brother   . Hypertension Brother   . Stroke Brother      ROS:  Please see the history of present illness.  All other ROS reviewed and negative.     Physical Exam/Data:   Vitals:   08/16/18 1945 08/16/18 2015 08/16/18 2051 08/17/18 0458  BP: 130/82 (!) 143/94 131/69 136/77  Pulse: 92 (!) 44 92 (!) 103  Resp: 18 (!) Temp:   98.2 F (36.8 C) 98.6 F (37 C)  TempSrc:   Oral Oral  SpO2: 95% 98% 98% 98%  Weight:   117.9 kg   Height:    (1.905 m)     Intake/Output Summary (Last 24 hours) at 08/17/2018 0830 Last data filed at 08/17/2018 0656 Gross per 24 hour  Intake 3 ml  Output -  Net 3 ml   Last 3 Weights 08/16/2018 08/16/2018 05/20/2018  Weight (lbs) 259 lb 14.8 oz 230 lb 225 lb 15.5 oz  Weight (kg) 117.9 kg 104.327 kg 102.5 kg     Body mass index is 32.49 kg/m.  General:  Well nourished, well developed, in no acute distress HEENT: normal Lymph: no adenopathy Neck: no JVD Endocrine:  No thryomegaly Vascular: No carotid bruits; FA pulses 2+ bilaterally  without bruits  Cardiac:  normal S1, S2; RRR; no murmur  Lungs:  clear to auscultation bilaterally, no wheezing, rhonchi or rales  Abd: Tender to palpation throughout Ext: no edema Musculoskeletal: Left BKA Skin: warm and dry  Neuro:  CNs 2-12 intact,  Psych:  Normal affect   Telemetry:  Telemetry was personally reviewed and demonstrates: Sinus rhythm at controlled ventricular rate  Relevant CV Studies:  LEFT HEART CATH AND CORONARY ANGIOGRAPHY 03/2017  Conclusion     Ost RCA to Prox RCA lesion, 100 %stenosed.  Ost Cx to Prox Cx lesion, 80 %stenosed.  Mid LAD lesion, 0 %stenosed.  There is mild left ventricular systolic dysfunction.  LV end diastolic pressure is mildly elevated.  The left ventricular ejection fraction is 50-55% by visual estimate.   IMPRESSION:Mr. Kamphaus has 2 vessel disease. This dominant RCA is occluded at its origin and he fills the distal vessel by left-to-right collaterals (grade 3). He has a high-grade ostial/proximal nondominant circumflex comes off the left main at a 90 angle and is heavily calcified. It gives rise to a large first marginal branch and a smaller second marginal branch. The mid LAD stent is widely patent that was placed in 2010 by myself. He has low normal EF in the 50% range. I think he is a bypass candidate. Unfortunately his LAD does not have significant disease. The circumflex is highly calcified and would require orbital rotational atherectomy which is somewhat high risk off of the left main within the 90 angle. I'm going to restart IV heparin. We will get the surgeons to evaluate him.  The sheath was removed and a TR band was placed on the right wrist to achieve patent hemostasis. The patient left the lab in stable condition.  Echocardiogram 11/2016 Study Conclusions  - Left ventricle: The cavity size was normal. There was moderate   concentric hypertrophy. Systolic function was normal. The   estimated ejection fraction was in the  range of 60% to 65%. Wall   motion was normal; there were no regional wall motion   abnormalities. Doppler parameters are consistent with abnormal   left ventricular relaxation (grade 1 diastolic dysfunction).   There was no evidence of elevated ventricular filling pressure by   Doppler parameters. - Aortic valve: There was no regurgitation. - Mitral valve: There was no regurgitation. - Left atrium: The atrium was normal in size. - Right ventricle: The cavity size was mildly dilated. Wall   thickness was normal. Systolic function was normal. - Tricuspid valve: There was trivial regurgitation. - Pulmonary arteries: Systolic pressure was within the normal   range. - Inferior vena cava: The vessel was normal in size. - Pericardium, extracardiac: There was no pericardial effusion  Laboratory Data:  Chemistry Recent Labs  Lab 08/16/18 1540 08/17/18 0316  NA 133* 136  K 5.3* 4.5  CL 96* 98  CO2 18* 22  GLUCOSE 390* 354*  BUN 21 27*  CREATININE 1.39* 1.30*  CALCIUM 9.4 9.2  GFRNONAA 53* 57*  GFRAA >60 >60  ANIONGAP 19* 16*    Recent Labs  Lab 08/16/18 1540  PROT 8.2*  ALBUMIN 3.9  AST 23  ALT 19  ALKPHOS 81  BILITOT QUANTITY NOT SUFFICIENT, UNABLE TO PERFORM TEST   Hematology Recent Labs  Lab 08/16/18 1540  WBC 10.6*  RBC 5.18  HGB 15.7  HCT 46.9  MCV 90.5  MCH 30.3  MCHC 33.5  RDW 12.3  PLT 326   Cardiac Enzymes Recent Labs  Lab 08/16/18 1540 08/16/18 2110 08/17/18 0316  TROPONINI <0.03 <0.03 <0.03   Radiology/Studies:  Dg Chest Portable 1 View  Result Date: 08/16/2018 CLINICAL DATA:  Center chest pain and SOB since this AM. Hx of HTN, DM, CAD, SOB, Pulmonary embolism, OSA, MI. EXAM: PORTABLE CHEST 1 VIEW COMPARISON:  02/03/2018 FINDINGS: Shallow lung inflation. Heart size is normal. Lungs are free of focal consolidations and pleural effusions. No pulmonary edema. IMPRESSION: No active disease. Electronically Signed   By: Norva Pavlov M.D.   On:  08/16/2018 16:12    Assessment and Plan:   1. Chest pain 2. Abdominal pain with vomiting and diarrhea 3.  CAD 4.  Gastroparesis 5.  Diabetes  Patient presented with 3 days history of nausea and loss of appetite.  Since yesterday he has multiple episode of vomiting and diarrhea then developed sternal chest pressure.  He has severe belching.  Patient ruled out.  Troponin has been negative.  EKG without acute ischemic changes, he has chronic minimal inferior ST elevation.  Last cath in 2018 as above.  Suspect his symptoms likely GI related.  Dr. Jens Som to see.   For questions or updates, please contact [facility] Please consult [email or web address] for contact info under     Signed, Manson Passey, PA  08/17/2018 8:30 AM   As above, patient seen and examined.  Briefly he is a 68 year old male with past medical history of coronary artery disease, peripheral vascular disease with prior left BKA, diabetes mellitus, hypertension, hyperlipidemia, obstructive sleep apnea, chronic gastroparesis for evaluation of chest pain.  Last cardiac catheterization October 2018 showed a  patent LAD stent, occluded right coronary artery and 80% circumflex lesion.  Patient was evaluated by cardiothoracic surgery and felt not to be a candidate for coronary artery bypass and graft.  PCI of circumflex was considered but would also be high risk based on notes.  He has chronic gastroparesis with nausea and vomiting.  He has had symptoms at this recently.  Yesterday he also developed burning and tightness in the substernal area radiating to his lower back by his report.  He continued to have nausea and vomiting from gastroparesis but denies diaphoresis.  Some dyspnea.  He was concerned it may be cardiac and he was admitted.  His pain has been persistent for 24 hours. Electrocardiogram shows sinus rhythm with no acute ST changes.  Troponins are normal. 1 chest pain-symptoms occurring in the setting of gastroparesis.  They  have been continuous for 24 hours and troponins are normal and electrocardiogram without acute ST changes.  I think this is likely GI related.  Would not pursue further cardiac evaluation.  2 gastroparesis-significant issues with this longstanding.  Further management per primary care.  3 coronary artery disease-patient has had previous PCI.  He is on aspirin, Plavix and apixaban.  Would discontinue Plavix as he requires apixaban for history of pulmonary embolus.  We will continue aspirin for now.  Continue statin.  He is on antianginals including amlodipine and metoprolol.  Other issues per primary care.  We will sign off.  Please call with questions.  Would ask patient to follow-up with Dr. Allyson Sabal 2 to 4 weeks following discharge.  Continue preadmission cardiac medications other than Plavix.  Olga Millers, MD

## 2018-08-18 LAB — COMPREHENSIVE METABOLIC PANEL
ALT: 15 U/L (ref 0–44)
AST: 13 U/L — ABNORMAL LOW (ref 15–41)
Albumin: 3.1 g/dL — ABNORMAL LOW (ref 3.5–5.0)
Alkaline Phosphatase: 63 U/L (ref 38–126)
Anion gap: 9 (ref 5–15)
BUN: 23 mg/dL (ref 8–23)
CO2: 27 mmol/L (ref 22–32)
Calcium: 8.7 mg/dL — ABNORMAL LOW (ref 8.9–10.3)
Chloride: 99 mmol/L (ref 98–111)
Creatinine, Ser: 1.04 mg/dL (ref 0.61–1.24)
GFR calc Af Amer: >60 mL/min (ref >60)
GFR calc non Af Amer: >60 mL/min (ref >60)
Glucose, Bld: 234 mg/dL — ABNORMAL HIGH (ref 70–99)
Potassium: 3.5 mmol/L (ref 3.5–5.1)
SODIUM: 135 mmol/L (ref 135–145)
Total Bilirubin: 0.7 mg/dL (ref 0.3–1.2)
Total Protein: 6.7 g/dL (ref 6.5–8.1)

## 2018-08-18 LAB — MAGNESIUM: Magnesium: 1.7 mg/dL (ref 1.7–2.4)

## 2018-08-18 LAB — CBC
HCT: 40 % (ref 39.0–52.0)
Hemoglobin: 13.6 g/dL (ref 13.0–17.0)
MCH: 30.7 pg (ref 26.0–34.0)
MCHC: 34 g/dL (ref 30.0–36.0)
MCV: 90.3 fL (ref 80.0–100.0)
PLATELETS: 241 10*3/uL (ref 150–400)
RBC: 4.43 MIL/uL (ref 4.22–5.81)
RDW: 12.7 % (ref 11.5–15.5)
WBC: 15.8 10*3/uL — ABNORMAL HIGH (ref 4.0–10.5)
nRBC: 0 % (ref 0.0–0.2)

## 2018-08-18 LAB — GLUCOSE, CAPILLARY
Glucose-Capillary: 147 mg/dL — ABNORMAL HIGH (ref 70–99)
Glucose-Capillary: 163 mg/dL — ABNORMAL HIGH (ref 70–99)
Glucose-Capillary: 167 mg/dL — ABNORMAL HIGH (ref 70–99)
Glucose-Capillary: 173 mg/dL — ABNORMAL HIGH (ref 70–99)
Glucose-Capillary: 215 mg/dL — ABNORMAL HIGH (ref 70–99)
Glucose-Capillary: 302 mg/dL — ABNORMAL HIGH (ref 70–99)

## 2018-08-18 MED ORDER — PANTOPRAZOLE SODIUM 40 MG IV SOLR
40.0000 mg | Freq: Two times a day (BID) | INTRAVENOUS | Status: DC
  Administered 2018-08-18 – 2018-08-20 (×4): 40 mg via INTRAVENOUS
  Filled 2018-08-18 (×4): qty 40

## 2018-08-18 MED ORDER — LACTATED RINGERS IV SOLN
INTRAVENOUS | Status: AC
  Administered 2018-08-18 – 2018-08-19 (×4): via INTRAVENOUS

## 2018-08-18 MED ORDER — ALUM & MAG HYDROXIDE-SIMETH 200-200-20 MG/5ML PO SUSP
30.0000 mL | ORAL | Status: DC | PRN
  Administered 2018-08-18 – 2018-08-20 (×8): 30 mL via ORAL
  Filled 2018-08-18 (×9): qty 30

## 2018-08-18 NOTE — Progress Notes (Signed)
Patient refused all scheduled HS medications due to nausea.

## 2018-08-18 NOTE — Progress Notes (Signed)
**Note De-Identified vi Obfusction** PROGRESS NOTE    NYHEIM [name]  TDH:[id number] DOB: 1950/09/05 DOA: 08/16/2018 PCP: Person Grippe, MD   Brief Nrrtive:  Samuel [name]  is  68 y.o. mle, with pst medicl history significnt for CAD, dibetes mellitus type 2 depression nd gstropresis presenting with  chest pin episode tht strted tody erly in .m. ssocited with nuse, cold swets nd shortness of breth.  The episode lsted until he cme to the emergency room nd he received morphine nd Phenergn.  Ptient reports  similr episode yesterdy tht he ttributed to his gstropresis.  Ptient reports tht this pin is similr to his prior hert ttcks.  Ptient received lso spirin, nitro nd Zofrn tody by EMS prior to rrivl to the ER which esed the pin significntly.  At this time the ptient is chest pin-free. In the emergency room work-up included Samuel EKG with chronic ST elevtions concve upwrds in the inferior leds.  Troponin ws negtive  Assessment & Pln:   Active Problems:   Dibetic gstropresis (HCC)   Chest pin   Gstropresis  Nuse  Vomiting  Abdominl Pin: suspect this is secondry to gstropresis.  He hd gstric emptying study in 2012 suggesting gstropresis.  Persistent symptoms tody, overll improved, but still unble to tke PO CT bdomen pelvis with diffuse thickening in distl esophgus, likely relted to reflux disese IV PPI dily IV regln QID Antiemetics (zofrn/phenergn prn) Minimize nrcotics s ble Diet s tolerted, will plce on clers for now Follow repet EKG tomorrow for QTc (461), continue tele  Chest pin  Coronry Artery Disese with hx MI  Peripherl Vsculr Disese  EKG's pper similr to prior (isolted T wve inversion in V2) Troponins negtive x3 Suspect 2/2 bove Crdiology c/s - suspects GI.  Recommended d/c plvix s he's lso on eliquis/spirin.  Recommending continue sttin, mlodipine, metoprolol. Needs follow up with Dr. Allyson Sbl 2-4 weeks  fter dischrge  Continue spirin, lipitor, metoprolol, rnex  Hyperlipidemi Continue with Lipitor  History of pulmonry embolism in 2012 Continue with Eliquis He hs IVC filter (needs outptient f/u regrding longterm pln for this - he notes he's been told this ws permnent)  Left subconjunctivl hemorrhge  Recent Ctrct Surgery Continue with prednisolone nd ofloxcin ointments  Dibetes mellitus type 2 Continue with crb modified diet Continue lntus 40 dily Q4 SSI while unrelible PO intke A1c is 8.7   Chronic distolic congestive hert filure Echocrdiogrm in 11/2016 showing ejection frction 60 to 65% with grde 1 distolic dysfunction Holding lsix while getting IVF with poor PO intke, nuse, vomiting Follow volume sttus I/O, dily weights  Leukocytosis: likely rective, follow, improving  DVT prophylxis: eliquis Code Sttus: DNR Fmily Communiction: none t bedside Disposition Pln: pending improvement.  Inptient given persistent nuse, vomiting 2/2 suspected flre of gstropresis.  Intrctble Samuel/V requiring IV ntiemetics nd IV fluids.  Poor PO intke.  Unble to be dischrged t this point.   Consultnts:   crdiology  Procedures:   none  Antimicrobils:  none   Subjective: Feels better thn yesterdy, but still with intrctble nuse.  Less vomiting tody. Still unble to tke ny PO.   Objective: Vitls:   08/18/18 0030 08/18/18 0442 08/18/18 0900 08/18/18 1210  BP: (!) 142/78 114/61 123/60 (!) 150/88  Pulse: 94 82 76 81  Resp: 19 18 18  (!) 23  Temp: 98.7 F (37.1 C) 98.5 F (36.9 C) 98.2 F (36.8 C) 98.5 F (36.9 C)  TempSrc: Orl Orl Orl Orl  SpO2:  99% 97% 97%  Weight: **Note De-Identified vi Obfusction** 117.6 kg    Height:        Intke/Output Summry (Lst 24 hours) t 08/18/2018 1406 Lst dt filed t 08/18/2018 1400 Gross per 24 hour  Intke 1115.12 ml  Output 1200 ml  Net -84.88 ml   Filed Weights   08/16/18 1545 08/16/18 2051  08/18/18 0442  Weight: 104.3 kg 117.9 kg 117.6 kg    Exmintion:  Generl: Appers uncomfortble, but less thn yesterdy Crdiovsculr: Hert sounds show  regulr rte, nd rhythm Lungs: Cler to usculttion bilterlly with good ir movement. No rles, rhonchi or wheezes. Abdomen: Soft, nontender, nondistended Neurologicl: Alert nd oriented 3. Moves ll extremities 4. Crnil nerves II through XII grossly intct. Skin: Wrm nd dry. No rshes or lesions. Extremities: L BKA  Psychitric: Mood nd ffect re norml. Insight nd judgment re pproprite.    Dt Reviewed: I hve personlly reviewed following lbs nd imging studies  CBC: Recent Lbs  Lb 08/16/18 1540 08/17/18 1036 08/18/18 0404  WBC 10.6* 16.6* 15.8*  NEUTROABS 7.8*  --   --   HGB 15.7 15.5 13.6  HCT 46.9 45.5 40.0  MCV 90.5 89.2 90.3  PLT 326 283 241   Bsic Metbolic Pnel: Recent Lbs  Lb 08/16/18 1540 08/17/18 0316 08/18/18 0404  NA 133* 136 135  K 5.3* 4.5 3.5  CL 96* 98 99  CO2 18* 22 27  GLUCOSE 390* 354* 234*  BUN 21 27* 23  CREATININE 1.39* 1.30* 1.04  CALCIUM 9.4 9.2 8.7*  MG  --   --  1.7   GFR: Estimted Cretinine Clernce: 97.9 mL/min (by C-G formul bsed on SCr of 1.04 mg/dL). Liver Function Tests: Recent Lbs  Lb 08/16/18 1540 08/17/18 1036 08/18/18 0404  AST 23 22 13*  ALT ALKPHOS 81 79 63  BILITOT QUANTITY NOT SUFFICIENT, UNABLE TO PERFORM TEST 1.5* 0.7  PROT 8.2* 8.1 6.7  ALBUMIN 3.9 3.7 3.1*   Recent Lbs  Lb 08/16/18 1540 08/17/18 1036  LIPASE 18 16   No results for input(s): AMMONIA in the lst 168 hours. Cogultion Profile: No results for input(s): INR, PROTIME in the lst 168 hours. Crdic Enzymes: Recent Lbs  Lb 08/16/18 1540 08/16/18 2110 08/17/18 0316 08/17/18 1036  TROPONINI <0.03 <0.03 <0.03 <0.03   BNP (lst 3 results) No results for input(s): PROBNP in the lst 8760 hours. HbA1C: Recent Lbs     08/16/18 2110  HGBA1C 8.7*   CBG: Recent Lbs  Lb 08/17/18 1152 08/17/18 1634 08/17/18 2115 08/18/18 0029 08/18/18 0459  GLUCAP 431* 350* 315* 302* 215*   Lipid Profile: Recent Lbs    08/17/18 1036  CHOL 168  HDL 28*  LDLCALC 94  TRIG 846*  CHOLHDL 6.0   Thyroid Function Tests: No results for input(s): TSH, T4TOTAL, FREET4, T3FREE, THYROIDAB in the lst 72 hours. Anemi Pnel: No results for input(s): VITAMINB12, FOLATE, FERRITIN, TIBC, IRON, RETICCTPCT in the lst 72 hours. Sepsis Lbs: No results for input(s): PROCALCITON, LATICACIDVEN in the lst 168 hours.  No results found for this or ny previous visit (from the pst 240 hour(s)).       Rdiology Studies: Dg Abd 1 View  Result Dte: 08/17/2018 CLINICAL DATA:  Abdominl pin EXAM: ABDOMEN - 1 VIEW COMPARISON:  01/18/2018 FINDINGS: Scttered lrge nd smll bowel gs is noted. No free ir is seen. No bnorml mss or bnorml clcifictions re noted. IVC filter is gin seen. No cute bony bnormlity is noted. IMPRESSION: No cute bnormlity noted. Electronically Signed   By: lcide Clever M.D.   On: 08/17/2018 08:53   Ct bdomen Pelvis W Contrast  Result Date: 08/17/2018 CLINICL DT:  Nausea and vomiting EXM: CT BDOMEN ND PELVIS WITH CONTRST TECHNIQUE: Multidetector CT imaging of the abdomen and pelvis was performed using the standard protocol following bolus administration of intravenous contrast. CONTRST:  <MESUREMENT> OMNIPQUE IOHEXOL 300 MG/ML  SOLN COMPRISON:  None. FINDINGS: Lower chest: Lung bases are free of acute infiltrate or sizable effusion. Coronary calcifications are noted. Hepatobiliary: Gallbladder has been surgically removed. The liver is fatty infiltrated. Pancreas: Fatty infiltration of the pancreas is seen. No focal mass is noted. Spleen: Spleen is within limits. drenals/Urinary Tract: drenal glands are within normal limits. Kidneys are well visualized bilaterally.  3.3 cm cyst is noted in  the upper pole of the left kidney. No renal calculi or obstructive changes are noted. The bladder is partially distended. Stomach/Bowel: The appendix is within normal limits. No acute obstructive or inflammatory changes of large or small bowel is noted. Mild thickening of the distal esophagus is noted which may be related underlying reflux. Vascular/Lymphatic: IVC filter is noted. ortic calcifications are noted without aneurysmal dilatation. No lymphadenopathy is noted. Reproductive: Prostate is unremarkable. Other: No abdominal wall hernia or abnormality. No abdominopelvic ascites. Musculoskeletal: Degenerative changes of lumbar spine are noted. IMPRESSION: Diffuse thickening in the distal esophagus likely related to reflux disease. Chronic changes without acute abnormality. Electronically Signed   By: lcide Clever M.D.   On: 08/17/2018 11:52   Dg Chest Portable 1 View  Result Date: 08/16/2018 CLINICL DT:  Center chest pain and SOB since this M. Hx of HTN, DM, CD, SOB, Pulmonary embolism, OS, MI. EXM: PORTBLE CHEST 1 VIEW COMPRISON:  02/03/2018 FINDINGS: Shallow lung inflation. Heart size is normal. Lungs are free of focal consolidations and pleural effusions. No pulmonary edema. IMPRESSION: No active disease. Electronically Signed   By: Norva Pavlov M.D.   On: 08/16/2018 16:12        Scheduled Meds: . amLODipine  10 mg Oral Daily  . apixaban  5 mg Oral BID  . aspirin EC  81 mg Oral Daily  . atorvastatin  80 mg Oral q1800  . ferrous sulfate  325 mg Oral QPM  . gabapentin  300 mg Oral QID  . insulin aspart  0-9 Units Subcutaneous Q4H  . insulin glargine  40 Units Subcutaneous Daily  . metoCLOPramide (REGLN) injection  10 mg Intravenous Q6H  . metoprolol tartrate  25 mg Oral BID  . ofloxacin  2-3 drop Both Eyes QID  . pantoprazole (PROTONIX) IV  40 mg Intravenous Q24H  . prednisoLONE acetate  2-3 drop Both Eyes QID  . ranolazine  500 mg Oral BID  . sodium chloride flush  3 mL  Intravenous Q12H   Continuous Infusions: . sodium chloride       LOS: 1 day    Time spent: over 30 min    Lacretia Nicks, MD [facility] Pager MION  If 7PM-7M, please contact night-coverage [email or web address] Password [email or web address] 08/18/2018, 2:06 PM

## 2018-08-18 NOTE — Consult Note (Signed)
   [facility] Inpatient Consult   08/18/2018  Samuel [name] [birth year] [id number]  Patient is noted to have an extreme high risk score for unplanned readmissions, with multiple ED visits.  Patient is currently active with [facility] Care Management for chronic disease management services.  Patient has been engaged by a [facility] RN Health Coach, [facility] Pharmacist and Pharmacy Assistant for Diabetes and Medication management and assistance. Patient currently admitted.  MD notes are as follows: [name] a67 y.o.male,with past medical history significant for CAD, diabetes mellitus type 2 depression and gastroparesis presenting with a chest pain episode that started today early in a.m. associated with nausea, cold sweats and shortness of breath. The episode lasted until he came to the emergency room and he received morphine and Phenergan. Patient reports a similar episode yesterday that he attributed to his gastroparesis. Patient reports that this pain is similar to his prior heart attacks. Patient received also aspirin, nitro and Zofran today by EMS prior to arrival to the ER which eased the pain significantly.   Our community based plan of care has focused on disease management and community resource support.  Patient will receive a post hospital call and will be evaluated for assessments and disease process education.  Will follow up with Inpatient Case Manager to make aware that [facility] Care Management following. Of note, [facility] Care Management services does not replace or interfere with any services that are needed or arranged by inpatient case management or social work.  For additional questions or referrals please contact:   [name], RN BSN CCM [facility]  [phone] business mobile phone Toll free office [phone]

## 2018-08-18 NOTE — Progress Notes (Signed)
Inpatient Diabetes Program Recommendations  AACE/ADA: New Consensus Statement on Inpatient Glycemic Control (2015)  Target Ranges:  Prepandial:   less than 140 mg/dL      Peak postprandial:   less than 180 mg/dL (1-2 hours)      Critically ill patients:  140 - 180 mg/dL   Results for BOWDY, PERETTI (MRN [id number]) as of 08/18/2018 15:57  Ref. Range 08/17/2018 07:28 08/17/2018 11:52 08/17/2018 16:34 08/17/2018 21:15 08/18/2018 00:29 08/18/2018 04:59  Glucose-Capillary Latest Ref Range: 70 - 99 mg/dL 045 (H) 997 (H) 741 (H) 315 (H) 302 (H) 215 (H)   Review of Glycemic Control  Diabetes history: DM 2 Outpatient Diabetes medications: Basaglar 40 units, Humalog 18 units tid meal coverage, Metformin 500 mg BID Current orders for Inpatient glycemic control: Lantus 40, Novolog 0-9 units Q4 hours  Inpatient Diabetes Program Recommendations:    Variable PO intake per chart. Consider increasing correction scale to Novolog 0-15 units Q4 hours. When eating patient would benefit from meal coverage.  Thanks, Christena Deem RN, MSN, BC-ADM Inpatient Diabetes Coordinator Team Pager [phone] (8a-5p)

## 2018-08-19 ENCOUNTER — Other Ambulatory Visit: Payer: Self-pay | Admitting: *Deleted

## 2018-08-19 ENCOUNTER — Ambulatory Visit: Payer: Self-pay | Admitting: Pharmacist

## 2018-08-19 LAB — COMPREHENSIVE METABOLIC PANEL
ALT: 13 U/L (ref 0–44)
AST: 17 U/L (ref 15–41)
Albumin: 2.7 g/dL — ABNORMAL LOW (ref 3.5–5.0)
Alkaline Phosphatase: 55 U/L (ref 38–126)
Anion gap: 12 (ref 5–15)
BILIRUBIN TOTAL: 0.8 mg/dL (ref 0.3–1.2)
BUN: 15 mg/dL (ref 8–23)
CO2: 23 mmol/L (ref 22–32)
Calcium: 8.5 mg/dL — ABNORMAL LOW (ref 8.9–10.3)
Chloride: 101 mmol/L (ref 98–111)
Creatinine, Ser: 0.91 mg/dL (ref 0.61–1.24)
GFR calc Af Amer: >60 mL/min (ref >60)
GFR calc non Af Amer: >60 mL/min (ref >60)
GLUCOSE: 145 mg/dL — AB (ref 70–99)
Potassium: 3.5 mmol/L (ref 3.5–5.1)
Sodium: 136 mmol/L (ref 135–145)
TOTAL PROTEIN: 6 g/dL — AB (ref 6.5–8.1)

## 2018-08-19 LAB — GLUCOSE, CAPILLARY
GLUCOSE-CAPILLARY: 144 mg/dL — AB (ref 70–99)
GLUCOSE-CAPILLARY: 144 mg/dL — AB (ref 70–99)
Glucose-Capillary: 143 mg/dL — ABNORMAL HIGH (ref 70–99)
Glucose-Capillary: 143 mg/dL — ABNORMAL HIGH (ref 70–99)
Glucose-Capillary: 151 mg/dL — ABNORMAL HIGH (ref 70–99)
Glucose-Capillary: 175 mg/dL — ABNORMAL HIGH (ref 70–99)
Glucose-Capillary: 223 mg/dL — ABNORMAL HIGH (ref 70–99)

## 2018-08-19 LAB — CBC
HCT: 38.4 % — ABNORMAL LOW (ref 39.0–52.0)
Hemoglobin: 12.9 g/dL — ABNORMAL LOW (ref 13.0–17.0)
MCH: 30.6 pg (ref 26.0–34.0)
MCHC: 33.6 g/dL (ref 30.0–36.0)
MCV: 91.2 fL (ref 80.0–100.0)
NRBC: 0 % (ref 0.0–0.2)
Platelets: 179 10*3/uL (ref 150–400)
RBC: 4.21 MIL/uL — ABNORMAL LOW (ref 4.22–5.81)
RDW: 12.3 % (ref 11.5–15.5)
WBC: 9.4 10*3/uL (ref 4.0–10.5)

## 2018-08-19 LAB — MAGNESIUM: MAGNESIUM: 1.8 mg/dL (ref 1.7–2.4)

## 2018-08-19 NOTE — Progress Notes (Signed)
PROGRESS NOTE    Samuel [name]  IWL:[id number] DOB: 03/18/1951 DOA: 08/16/2018 PCP: [name], MD   Brief Narrative:  Samuel [name]  is Samuel [name] 68 y.o. male, with past medical history significant for CAD, diabetes mellitus type 2 depression and gastroparesis presenting with Samuel [name] chest pain episode that started today early in [name].m. associated with nausea, cold sweats and shortness of breath.  The episode lasted until he came to the emergency room and he received morphine and Phenergan.  Patient reports Samuel [name] similar episode yesterday that he attributed to his gastroparesis.  Patient reports that this pain is similar to his prior heart attacks.  Patient received also aspirin, nitro and Zofran today by EMS prior to arrival to the ER which eased the pain significantly.  At this time the patient is chest pain-free. In the emergency room work-up included an EKG with chronic ST elevations concave upwards in the inferior leads.  Troponin was negative  Assessment & Plan:   Active Problems:   Diabetic gastroparesis (HCC)   Chest pain   Gastroparesis  Nausea  Vomiting  Abdominal Pain: suspect this is secondary to gastroparesis.  He had gastric emptying study in 2012 suggesting gastroparesis.  Symptoms improving.  Continues to have difficulty with PO intake.  Complains of reflux/indigestion, discomfort with swallowing. CT abdomen pelvis with diffuse thickening in distal esophagus, likely related to reflux disease IV PPI daily - if reflux/indigestion/discomfort with swallowing does not improve with PPI, may need to discuss with GI  IV reglan QID Antiemetics (zofran/phenergan prn) Minimize narcotics as able Diet as tolerated, will place on clears for now repeat EKG tomorrow for QTc (461), continue tele  Chest pain  Coronary Artery Disease with hx MI  Peripheral Vascular Disease  EKG's appear similar to prior (isolated T wave inversion in V2) Troponins negative x3 Suspect 2/2 above Cardiology c/s - suspects  GI.  Recommended d/c plavix as he's also on eliquis/aspirin.  Recommending continue statin, amlodipine, metoprolol. Needs follow up with Dr. Allyson Sabal 2-4 weeks after discharge  Continue aspirin, lipitor, metoprolol, ranexa  Hyperlipidemia Continue with Lipitor  History of pulmonary embolism in 2012 Continue with Eliquis He has IVC filter (needs outpatient f/u regarding longterm plan for this - he notes he's been told this was permanent)  Left subconjunctival hemorrhage  Recent Cataract Surgery Continue with prednisolone and ofloxacin ointments  Diabetes mellitus type 2 Continue with carb modified diet Continue lantus 40 daily Q4 SSI while unreliable PO intake A1c is 8.7   Chronic diastolic congestive heart failure Echocardiogram in 11/2016 showing ejection fraction 60 to 65% with grade 1 diastolic dysfunction Holding lasix while getting IVF with poor PO intake, nausea, vomiting Follow volume status I/O, daily weights  Leukocytosis: likely reactive, follow, improving  DVT prophylaxis: eliquis Code Status: DNR Family Communication: none at bedside Disposition Plan: pending improvement.  Inpatient given persistent nausea, vomiting 2/2 suspected flare of gastroparesis.  Intractable N/V requiring IV antiemetics and IV fluids.  Poor PO intake.  Unable to be discharged at this point.   Consultants:   cardiology  Procedures:   none  Antimicrobials:  none   Subjective: Feels slightly better. Still difficult to eat due to indigestion, discomfort with swallowing.  Objective: Vitals:   08/19/18 0924 08/19/18 1216 08/19/18 1706 08/19/18 1941  BP:   (!) 147/82 (!) 150/81  Pulse: 77  80 79  Resp:   (!) 22 (!) 21  Temp:  98.4 F (36.9 C) 98 F (36.7 C) 97.8 F (36.6 C)  TempSrc:  Oral Oral Oral  SpO2:   93% 96%  Weight:      Height:        Intake/Output Summary (Last 24 hours) at 08/19/2018 2015 Last data filed at 08/19/2018 1950 Gross per 24 hour  Intake 4077.03 ml   Output 1625 ml  Net 2452.03 ml   Filed Weights   08/16/18 2051 08/18/18 0442 08/19/18 0425  Weight: 117.9 kg 117.6 kg 120 kg    Examination:  General: No acute distress. Cardiovascular: Heart sounds show Samuel [name] regular rate, and rhythm. Lungs: Clear to auscultation bilaterally  Abdomen: Soft, nontender, nondistended Neurological: Alert and oriented 3. Moves all extremities 4. Cranial nerves II through XII grossly intact. Skin: Warm and dry. No rashes or lesions. Extremities: L BKA Psychiatric: Mood and affect are normal. Insight and judgment are appropriate.    Data Reviewed: I have personally reviewed following labs and imaging studies  CBC: Recent Labs  Lab 08/16/18 1540 08/17/18 1036 08/18/18 0404 08/19/18 0330  WBC 10.6* 16.6* 15.8* 9.4  NEUTROABS 7.8*  --   --   --   HGB 15.7 15.5 13.6 12.9*  HCT 46.9 45.5 40.0 38.4*  MCV 90.5 89.2 90.3 91.2  PLT 326 283 241 179   Basic Metabolic Panel: Recent Labs  Lab 08/16/18 1540 08/17/18 0316 08/18/18 0404 08/19/18 0330  NA 133* 136 135 136  K 5.3* 4.5 3.5 3.5  CL 96* 98 99 101  CO2 18* 22 27 23   GLUCOSE 390* 354* 234* 145*  BUN 21 27* 23 15  CREATININE 1.39* 1.30* 1.04 0.91  CALCIUM 9.4 9.2 8.7* 8.5*  MG  --   --  1.7 1.8   GFR: Estimated Creatinine Clearance: 113 mL/min (by C-G formula based on SCr of 0.91 mg/dL). Liver Function Tests: Recent Labs  Lab 08/16/18 1540 08/17/18 1036 08/18/18 0404 08/19/18 0330  AST 23 22 13* 17  ALT 19 20 15 13   ALKPHOS 81 79 63 55  BILITOT QUANTITY NOT SUFFICIENT, UNABLE TO PERFORM TEST 1.5* 0.7 0.8  PROT 8.2* 8.1 6.7 6.0*  ALBUMIN 3.9 3.7 3.1* 2.7*   Recent Labs  Lab 08/16/18 1540 08/17/18 1036  LIPASE 18 16   No results for input(s): AMMONIA in the last 168 hours. Coagulation Profile: No results for input(s): INR, PROTIME in the last 168 hours. Cardiac Enzymes: Recent Labs  Lab 08/16/18 1540 08/16/18 2110 08/17/18 0316 08/17/18 1036  TROPONINI <0.03  <0.03 <0.03 <0.03   BNP (last 3 results) No results for input(s): PROBNP in the last 8760 hours. HbA1C: Recent Labs    08/16/18 2110  HGBA1C 8.7*   CBG: Recent Labs  Lab 08/19/18 0422 08/19/18 0749 08/19/18 1213 08/19/18 1702 08/19/18 1940  GLUCAP 143* 143* 144* 151* 175*   Lipid Profile: Recent Labs    08/17/18 1036  CHOL 168  HDL 28*  LDLCALC 94  TRIG 409*  CHOLHDL 6.0   Thyroid Function Tests: No results for input(s): TSH, T4TOTAL, FREET4, T3FREE, THYROIDAB in the last 72 hours. Anemia Panel: No results for input(s): VITAMINB12, FOLATE, FERRITIN, TIBC, IRON, RETICCTPCT in the last 72 hours. Sepsis Labs: No results for input(s): PROCALCITON, LATICACIDVEN in the last 168 hours.  No results found for this or any previous visit (from the past 240 hour(s)).       Radiology Studies: No results found.      Scheduled Meds: . amLODipine  10 mg Oral Daily  . apixaban  5 mg Oral BID  . aspirin EC  81 mg Oral Daily  . atorvastatin  80 mg Oral q1800  . ferrous sulfate  325 mg Oral QPM  . gabapentin  300 mg Oral QID  . insulin aspart  0-9 Units Subcutaneous Q4H  . insulin glargine  40 Units Subcutaneous Daily  . metoCLOPramide (REGLAN) injection  10 mg Intravenous Q6H  . metoprolol tartrate  25 mg Oral BID  . ofloxacin  2-3 drop Both Eyes QID  . pantoprazole (PROTONIX) IV  40 mg Intravenous Q12H  . prednisoLONE acetate  2-3 drop Both Eyes QID  . ranolazine  500 mg Oral BID  . sodium chloride flush  3 mL Intravenous Q12H   Continuous Infusions: . sodium chloride       LOS: 2 days    Time spent: over 30 min    Lacretia Nicks, MD [facility] Pager AMION  If 7PM-7AM, please contact night-coverage [email or web address] Password [facility] 08/19/2018, 8:15 PM

## 2018-08-20 DIAGNOSIS — E876 Hypokalemia: Secondary | ICD-10-CM

## 2018-08-20 LAB — COMPREHENSIVE METABOLIC PANEL
ALT: 16 U/L (ref 0–44)
AST: 19 U/L (ref 15–41)
Albumin: 2.8 g/dL — ABNORMAL LOW (ref 3.5–5.0)
Alkaline Phosphatase: 55 U/L (ref 38–126)
Anion gap: 8 (ref 5–15)
BUN: 12 mg/dL (ref 8–23)
CALCIUM: 8.7 mg/dL — AB (ref 8.9–10.3)
CO2: 28 mmol/L (ref 22–32)
Chloride: 101 mmol/L (ref 98–111)
Creatinine, Ser: 0.92 mg/dL (ref 0.61–1.24)
GFR calc Af Amer: >60 mL/min (ref >60)
GFR calc non Af Amer: >60 mL/min (ref >60)
Glucose, Bld: 173 mg/dL — ABNORMAL HIGH (ref 70–99)
Potassium: 3.4 mmol/L — ABNORMAL LOW (ref 3.5–5.1)
Sodium: 137 mmol/L (ref 135–145)
Total Bilirubin: 0.7 mg/dL (ref 0.3–1.2)
Total Protein: 6.3 g/dL — ABNORMAL LOW (ref 6.5–8.1)

## 2018-08-20 LAB — GLUCOSE, CAPILLARY
Glucose-Capillary: 153 mg/dL — ABNORMAL HIGH (ref 70–99)
Glucose-Capillary: 162 mg/dL — ABNORMAL HIGH (ref 70–99)
Glucose-Capillary: 209 mg/dL — ABNORMAL HIGH (ref 70–99)
Glucose-Capillary: 226 mg/dL — ABNORMAL HIGH (ref 70–99)
Glucose-Capillary: 234 mg/dL — ABNORMAL HIGH (ref 70–99)
Glucose-Capillary: 235 mg/dL — ABNORMAL HIGH (ref 70–99)

## 2018-08-20 LAB — CBC
HCT: 39.5 % (ref 39.0–52.0)
Hemoglobin: 13 g/dL (ref 13.0–17.0)
MCH: 30 pg (ref 26.0–34.0)
MCHC: 32.9 g/dL (ref 30.0–36.0)
MCV: 91 fL (ref 80.0–100.0)
Platelets: 206 10*3/uL (ref 150–400)
RBC: 4.34 MIL/uL (ref 4.22–5.81)
RDW: 12.2 % (ref 11.5–15.5)
WBC: 6.7 10*3/uL (ref 4.0–10.5)
nRBC: 0 % (ref 0.0–0.2)

## 2018-08-20 LAB — MAGNESIUM: Magnesium: 2 mg/dL (ref 1.7–2.4)

## 2018-08-20 MED ORDER — FAMOTIDINE 20 MG PO TABS: 20.0000 mg | ORAL_TABLET | Freq: Every day | ORAL | Status: DC

## 2018-08-20 MED ORDER — POTASSIUM CHLORIDE CRYS ER 20 MEQ PO TBCR
40.0000 meq | EXTENDED_RELEASE_TABLET | Freq: Once | ORAL | Status: AC
  Administered 2018-08-20: 40 meq via ORAL
  Filled 2018-08-20: qty 2

## 2018-08-20 MED ORDER — PANTOPRAZOLE SODIUM 40 MG PO TBEC
40.0000 mg | DELAYED_RELEASE_TABLET | Freq: Two times a day (BID) | ORAL | Status: DC
  Administered 2018-08-20 – 2018-08-21 (×2): 40 mg via ORAL
  Filled 2018-08-20 (×2): qty 1

## 2018-08-20 NOTE — Care Management Important Message (Signed)
Important Message  Patient Details  Name: Samuel [name] MRN: [id number] Date of Birth: [birth year]   Medicare Important Message Given:  Yes    Nakea Gouger P Janeene Sand 08/20/2018, 2:04 PM

## 2018-08-20 NOTE — Progress Notes (Signed)
PROGRESS NOTE    Samuel [name]  EGB:[id number] DOB: April 10, 1951 DOA: 08/16/2018 PCP: [name], MD   Brief Narrative:  HPI on 08/16/2018 by Dr. Carron Curie Bookert Redeker  is a 68 y.o. male, with past medical history significant for CAD, diabetes mellitus type 2 depression and gastroparesis presenting with a chest pain episode that started today early in a.m. associated with nausea, cold sweats and shortness of breath.  The episode lasted until he came to the emergency room and he received morphine and Phenergan.  Patient reports a similar episode yesterday that he attributed to his gastroparesis.  Patient reports that this pain is similar to his prior heart attacks.  Patient received also aspirin, nitro and Zofran today by EMS prior to arrival to the ER which eased the pain significantly.  At this time the patient is chest pain-free. In the emergency room work-up included an EKG with chronic ST elevations concave upwards in the inferior leads.  Troponin was negative  Interim history Patient presented with nausea, vomiting, abdominal pain which is thought to be secondary to gastroparesis.  Continues to have difficulty with oral intake complaining of reflux and indigestion, discomfort with swallowing. Assessment & Plan   Nausea, vomiting, abdominal pain -Suspect secondary to gastroparesis.  Patient had gastric emptying study in 2012 which was suggestive of gastroparesis. -Patient symptoms are improving however he continues to have difficulty with oral intake and complains of indigestion and reflux, discomfort with swallowing -Patient was started on IV PPI twice daily, however will place on oral Protonix 40 mg daily to be given 30 minutes prior to meals -Continue IV Reglan 4 times daily, antiemetics as needed as well as pain control -CT abdomen pelvis showed diffuse thickening of the distal esophagus likely related to reflux disease -If no improvement, will consult to gastroenterology  Chest pain/  Coronary artery disease/ Peripheral vascular disease -EKG reviewed and appears to be similar to prior -Troponins negative x3 -Suspect secondary to the above -Cardiology is consulted and specs GI etiology.  Recommended to discontinue Plavix as patient is on Eliquis as well as aspirin.  Continue statin, amlodipine, metoprolol.  Follow-up with Dr. Gery Pray in 2 to 4 weeks after discharge.  Hyperlipidemia -Continue statin  History of pulmonary embolism -Occurred in 2012, patient has IVC filter (needs outpatient follow-up regarding long-term plans for this-he states that this is permanent) -Continue Eliquis  Left subconjunctival hemorrhage/recent cataract surgery -Continue prednisolone and ofloxacin ointment  Diabetes mellitus, type II -Continue Lantus, insulin sliding scale with CBG monitoring  Chronic diastolic congestive heart failure -Echocardiogram in 2018 showed an EF of 60 to 65%, grade 1 diastolic dysfunction -Lasix was held as patient was receiving IV fluids due to poor oral intake with nausea and vomiting -Monitor intake and output, daily weights  Leukocytosis -Resolved, suspect reactive  DVT Prophylaxis Eliquis  Code Status: DNR  Family Communication: None at bedside  Disposition Plan: Admitted.  Pending improvement in patient's pain and ability to tolerate oral intake  Consultants Cardiology  Procedures  None  Antibiotics   Anti-infectives (From admission, onward)   None      Subjective:   Samuel [name] seen and examined today.  He used to feel pain with swallowing burning and indigestion.  Denies current chest pain, shortness of breath, nausea or vomiting, constipation, diarrhea, dizziness or headache.  Objective:   Vitals:   08/20/18 0445 08/20/18 0812 08/20/18 0954 08/20/18 1156  BP: 121/71 (!) 124/58  125/75  Pulse: 71 71 78 82  Resp:  16 15  (!) 31  Temp: 97.8 F (36.6 C)   98.3 F (36.8 C)  TempSrc: Oral   Oral  SpO2: 95% 95%  95%  Weight: 119.2  kg     Height:        Intake/Output Summary (Last 24 hours) at 08/20/2018 1637 Last data filed at 08/20/2018 1200 Gross per 24 hour  Intake 846 ml  Output 900 ml  Net -54 ml   Filed Weights   08/18/18 0442 08/19/18 0425 08/20/18 0445  Weight: 117.6 kg 120 kg 119.2 kg    Exam  General: Well developed, well nourished, NAD, appears stated age  HEENT: NCAT, mucous membranes moist.   Neck: Supple  Cardiovascular: S1 S2 auscultated, no rubs, murmurs or gallops. Regular rate and rhythm.  Respiratory: Clear to auscultation bilaterally with equal chest rise  Abdomen: Soft, obese, mild epigastric TTP, nondistended, + bowel sounds  Extremities: warm dry without cyanosis clubbing or edema of RLE. L BKA  Neuro: AAOx3, nonfocal   Psych: Normal affect and demeanor with intact judgement and insight   Data Reviewed: I have personally reviewed following labs and imaging studies  CBC: Recent Labs  Lab 08/16/18 1540 08/17/18 1036 08/18/18 0404 08/19/18 0330 08/20/18 0341  WBC 10.6* 16.6* 15.8* 9.4 6.7  NEUTROABS 7.8*  --   --   --   --   HGB 15.7 15.5 13.6 12.9* 13.0  HCT 46.9 45.5 40.0 38.4* 39.5  MCV 90.5 89.2 90.3 91.2 91.0  PLT 326 283 241 179 206   Basic Metabolic Panel: Recent Labs  Lab 08/16/18 1540 08/17/18 0316 08/18/18 0404 08/19/18 0330 08/20/18 0341  NA 133* 136 135 136 137  K 5.3* 4.5 3.5 3.5 3.4*  CL 96* 98 99 101 101  CO2 18* 22 27 23 28   GLUCOSE 390* 354* 234* 145* 173*  BUN 21 27* 23 15 12   CREATININE 1.39* 1.30* 1.04 0.91 0.92  CALCIUM 9.4 9.2 8.7* 8.5* 8.7*  MG  --   --  1.7 1.8 2.0   GFR: Estimated Creatinine Clearance: 111.4 mL/min (by C-G formula based on SCr of 0.92 mg/dL). Liver Function Tests: Recent Labs  Lab 08/16/18 1540 08/17/18 1036 08/18/18 0404 08/19/18 0330 08/20/18 0341  AST 23 22 13* 17 19  ALT 19 20 15 13 16   ALKPHOS 81 79 63 55 55  BILITOT QUANTITY NOT SUFFICIENT, UNABLE TO PERFORM TEST 1.5* 0.7 0.8 0.7  PROT 8.2* 8.1  6.7 6.0* 6.3*  ALBUMIN 3.9 3.7 3.1* 2.7* 2.8*   Recent Labs  Lab 08/16/18 1540 08/17/18 1036  LIPASE 18 16   No results for input(s): AMMONIA in the last 168 hours. Coagulation Profile: No results for input(s): INR, PROTIME in the last 168 hours. Cardiac Enzymes: Recent Labs  Lab 08/16/18 1540 08/16/18 2110 08/17/18 0316 08/17/18 1036  TROPONINI <0.03 <0.03 <0.03 <0.03   BNP (last 3 results) No results for input(s): PROBNP in the last 8760 hours. HbA1C: No results for input(s): HGBA1C in the last 72 hours. CBG: Recent Labs  Lab 08/19/18 1940 08/19/18 2349 08/20/18 0443 08/20/18 0804 08/20/18 1149  GLUCAP 175* 223* 153* 162* 226*   Lipid Profile: No results for input(s): CHOL, HDL, LDLCALC, TRIG, CHOLHDL, LDLDIRECT in the last 72 hours. Thyroid Function Tests: No results for input(s): TSH, T4TOTAL, FREET4, T3FREE, THYROIDAB in the last 72 hours. Anemia Panel: No results for input(s): VITAMINB12, FOLATE, FERRITIN, TIBC, IRON, RETICCTPCT in the last 72 hours. Urine analysis:    Component Value  Date/Time   COLORURINE STRAW (A) 06/01/2018 1442   APPEARANCEUR CLEAR 06/01/2018 1442   LABSPEC 1.012 06/01/2018 1442   PHURINE 8.0 06/01/2018 1442   GLUCOSEU 150 (A) 06/01/2018 1442   HGBUR NEGATIVE 06/01/2018 1442   BILIRUBINUR NEGATIVE 06/01/2018 1442   KETONESUR 5 (A) 06/01/2018 1442   PROTEINUR NEGATIVE 06/01/2018 1442   UROBILINOGEN 1.0 04/05/2015 1155   NITRITE NEGATIVE 06/01/2018 1442   LEUKOCYTESUR NEGATIVE 06/01/2018 1442   Sepsis Labs: @LABRCNTIP (procalcitonin:4,lacticidven:4)  )No results found for this or any previous visit (from the past 240 hour(s)).    Radiology Studies: No results found.   Scheduled Meds: . amLODipine  10 mg Oral Daily  . apixaban  5 mg Oral BID  . aspirin EC  81 mg Oral Daily  . atorvastatin  80 mg Oral q1800  . ferrous sulfate  325 mg Oral QPM  . gabapentin  300 mg Oral QID  . insulin aspart  0-9 Units Subcutaneous Q4H    . insulin glargine  40 Units Subcutaneous Daily  . metoCLOPramide (REGLAN) injection  10 mg Intravenous Q6H  . metoprolol tartrate  25 mg Oral BID  . ofloxacin  2-3 drop Both Eyes QID  . pantoprazole  40 mg Oral BID AC  . prednisoLONE acetate  2-3 drop Both Eyes QID  . ranolazine  500 mg Oral BID  . sodium chloride flush  3 mL Intravenous Q12H   Continuous Infusions: . sodium chloride       LOS: 3 days   Time Spent in minutes   30 minutes  Nailea Whitehorn D.O. on 08/20/2018 at 4:37 PM  Between 7am to 7pm - Please see pager noted on [email or web address]  After 7pm go to [email or web address]  And look for the night coverage person covering for me after hours  [facility] Office  [phone]

## 2018-08-20 NOTE — Evaluation (Signed)
Physical Therapy Evaluation & Discharge Patient Details Name: Samuel [name] MRN: [id number] DOB: 06/10/1951 Today's Date: 08/20/2018   History of Present Illness  Pt is a 68 y.o. male admitted 08/16/18 with chest pain, nausea and SOB. Worked up for suspected gastroparesis. 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    Clinical Impression  Patient evaluated by Physical Therapy with no further acute PT needs identified. PTA, pt mod indep with wheelchair, does not don LLE prosthetic often to walk; cousin lives next door and assists with ADLs PRN. Today, pt able to perform squat pivot and standing transfers at supervision-level. All education has been completed and the patient has no further questions. Acute PT is signing off. Thank you for this referral.    Follow Up Recommendations No PT follow up;Supervision for mobility/OOB    Equipment Recommendations  None recommended by PT    Recommendations for Other Services       Precautions / Restrictions Precautions Precautions: Fall Precaution Comments: previous L BKA (2016) Restrictions Weight Bearing Restrictions: No      Mobility  Bed Mobility Overal bed mobility: Modified Independent                Transfers Overall transfer level: Needs assistance Equipment used: None;Rolling walker (2 wheeled) Transfers: Scientist, clinical (histocompatibility and immunogenetics) Transfers;Sit to/from Stand Sit to Stand: Supervision   Squat pivot transfers: Supervision     General transfer comment: Supervision to perform squat pivot from bed to recliner, only requiring assist from lines. Supervision to stand with RW, pt able to achieve fully upright standing on RLE (LLE prosthetic not on); dependent for pericare secondary to needing BUE support for balance  Ambulation/Gait             General Gait Details: NT - pt does not plan to have prosthetic brought to hospital  Stairs            Wheelchair Mobility     Modified Rankin (Stroke Patients Only)       Balance Overall balance assessment: Needs assistance   Sitting balance-Leahy Scale: Good       Standing balance-Leahy Scale: Poor                               Pertinent Vitals/Pain Pain Assessment: Faces Faces Pain Scale: Hurts a little bit Pain Location: Abdomen/nausea Pain Descriptors / Indicators: Discomfort;Pressure Pain Intervention(s): Limited activity within patient's tolerance    Home Living Family/patient expects to be discharged to:: Private residence Living Arrangements: Alone Available Help at Discharge: Family;Available PRN/intermittently Type of Home: Apartment Home Access: Level entry     Home Layout: One level Home Equipment: Walker - 2 wheels;Wheelchair - power;Wheelchair - manual;Tub bench;Hand held shower head;Grab bars - tub/shower;Grab bars - toilet Additional Comments: prosthetic for L LE    Prior Function Level of Independence: Needs assistance   Gait / Transfers Assistance Needed: Only uses RW to ambulate when he dons prothesis, reports he does not wear often and typically uses manual w/c. Indep with pivot transfers to/from w/c, BSC over toilet and shower bench that has rotating seat  ADL's / Homemaking Assistance Needed: Cousin lives next door and acts as caregiver. Always present when pt bathes to assist with washing/drying backside. Pt mod indep with other ADLs        Hand Dominance   Dominant Hand: Right    Extremity/Trunk Assessment   Upper Extremity  Assessment Upper Extremity Assessment: Overall WFL for tasks assessed    Lower Extremity Assessment Lower Extremity Assessment: Overall WFL for tasks assessed(previous L BKA)       Communication   Communication: No difficulties  Cognition Arousal/Alertness: Awake/alert Behavior During Therapy: WFL for tasks assessed/performed Overall Cognitive Status: Within Functional Limits for tasks assessed                                         General Comments      Exercises     Assessment/Plan    PT Assessment Patent does not need any further PT services  PT Problem List         PT Treatment Interventions      PT Goals (Current goals can be found in the Care Plan section)  Acute Rehab PT Goals PT Goal Formulation: All assessment and education complete, DC therapy    Frequency     Barriers to discharge        Co-evaluation               AM-PAC PT "6 Clicks" Mobility  Outcome Measure Help needed turning from your back to your side while in a flat bed without using bedrails?: None Help needed moving from lying on your back to sitting on the side of a flat bed without using bedrails?: None Help needed moving to and from a bed to a chair (including a wheelchair)?: None Help needed standing up from a chair using your arms (e.g., wheelchair or bedside chair)?: A Little Help needed to walk in hospital room?: A Little Help needed climbing 3-5 steps with a railing? : A Lot 6 Click Score: 20    End of Session   Activity Tolerance: Patient tolerated treatment well Patient left: in chair;with call bell/phone within reach;with chair alarm set Nurse Communication: Mobility status PT Visit Diagnosis: Other abnormalities of gait and mobility (R26.89)    Time: 0938-1829 PT Time Calculation (min) (ACUTE ONLY): 17 min   Charges:   PT Evaluation $PT Eval Low Complexity: 1 Low        Ina Homes, PT, DPT Acute Rehabilitation Services  Pager [phone] Office [phone]  Malachy Chamber 08/20/2018, 12:45 PM

## 2018-08-21 LAB — BASIC METABOLIC PANEL
ANION GAP: 8 (ref 5–15)
BUN: 12 mg/dL (ref 8–23)
CO2: 28 mmol/L (ref 22–32)
Calcium: 8.8 mg/dL — ABNORMAL LOW (ref 8.9–10.3)
Chloride: 100 mmol/L (ref 98–111)
Creatinine, Ser: 0.93 mg/dL (ref 0.61–1.24)
GFR calc Af Amer: >60 mL/min (ref >60)
GLUCOSE: 161 mg/dL — AB (ref 70–99)
Potassium: 3.6 mmol/L (ref 3.5–5.1)
Sodium: 136 mmol/L (ref 135–145)

## 2018-08-21 LAB — GLUCOSE, CAPILLARY
GLUCOSE-CAPILLARY: 148 mg/dL — AB (ref 70–99)
Glucose-Capillary: 142 mg/dL — ABNORMAL HIGH (ref 70–99)

## 2018-08-21 MED ORDER — METOCLOPRAMIDE HCL 10 MG PO TABS: 10.0000 mg | ORAL_TABLET | Freq: Four times a day (QID) | ORAL | 0 refills | Status: DC

## 2018-08-21 MED ORDER — PANTOPRAZOLE SODIUM 40 MG PO TBEC: 40.0000 mg | DELAYED_RELEASE_TABLET | Freq: Two times a day (BID) | ORAL | 0 refills | Status: AC

## 2018-08-21 NOTE — Discharge Summary (Signed)
Physician Discharge Summary  Samuel [name] ZOX:[id number] DOB: 12-20-1950 DOA: 08/16/2018  PCP: [name], MD  Admit date: 08/16/2018 Discharge date: 08/21/2018  Time spent: 45 minutes  Recommendations for Outpatient Follow-up:  Patient will be discharged to home.  Patient will need to follow up with primary care provider within one week of discharge.  Follow up with Dr. Matthias Hughs, gastroenterologist. Follow up with cardiology, Dr. Allyson Sabal, in 2-4 weeks.  Patient should continue medications as prescribed.  Patient should follow a heart healthy/carb modified diet.    Discharge Diagnoses:  Nausea, vomiting, abdominal pain Chest pain/ Coronary artery disease/ Peripheral vascular disease Hyperlipidemia History of pulmonary embolism Left subconjunctival hemorrhage/recent cataract surgery Diabetes mellitus, type II Chronic diastolic congestive heart failure Leukocytosis  Discharge Condition: Stable  Diet recommendation: heart healthy/carb modified  Filed Weights   08/19/18 0425 08/20/18 0445 08/21/18 0404  Weight: 120 kg 119.2 kg 118.6 kg    History of present illness:  on 08/16/2018 by Dr. Carron [name] [name] a67 y.o.male,with past medical history significant for CAD, diabetes mellitus type 2 depression and gastroparesis presenting with a chest pain episode that started today early in a.m. associated with nausea, cold sweats and shortness of breath. The episode lasted until he came to the emergency room and he received morphine and Phenergan. Patient reports a similar episode yesterday that he attributed to his gastroparesis. Patient reports that this pain is similar to his prior heart attacks. Patient received also aspirin, nitro and Zofran today by EMS prior to arrival to the ER which eased the pain significantly. At this time the patient is chest pain-free. In the emergency room work-up included an EKG with chronic ST elevations concave upwards in the inferior leads.  Troponin was negative  Hospital Course:  Nausea, vomiting, abdominal pain -Suspect secondary to gastroparesis.  Patient had gastric emptying study in 2012 which was suggestive of gastroparesis. -Patient symptoms are improving however he continues to have difficulty with oral intake and complains of indigestion and reflux, discomfort with swallowing -Patient was started on IV PPI twice daily, however will place on oral Protonix 40 mg daily to be given 30 minutes prior to meals -Continue IV Reglan 4 times daily, antiemetics as needed as well as pain control -CT abdomen pelvis showed diffuse thickening of the distal esophagus likely related to reflux disease -Discussed with Dr. Matthias Hughs, continue PPI BID and gradually decrease Reglan. Outpatient follow up  Chest pain/ Coronary artery disease/ Peripheral vascular disease -EKG reviewed and appears to be similar to prior -Troponins negative x3 -Suspect secondary to the above -Cardiology is consulted and suspects GI etiology.  Recommended to discontinue Plavix as patient is on Eliquis as well as aspirin.  Continue statin, amlodipine, metoprolol.  Follow-up with Dr. Allyson Sabal in 2 to 4 weeks after discharge.  Hyperlipidemia -Continue statin  History of pulmonary embolism -Occurred in 2012, patient has IVC filter (needs outpatient follow-up regarding long-term plans for this-he states that this is permanent) -Continue Eliquis  Left subconjunctival hemorrhage/recent cataract surgery -Continue prednisolone and ofloxacin ointment  Diabetes mellitus, type II -Continue Lantus, insulin sliding scale with CBG monitoring  Chronic diastolic congestive heart failure -Echocardiogram in 2018 showed an EF of 60 to 65%, grade 1 diastolic dysfunction -Lasix was held as patient was receiving IV fluids due to poor oral intake with nausea and vomiting -Monitor intake and output, daily weights  Leukocytosis -Resolved, suspect reactive  Code status:  DNR  Procedures:  None  Consultations:  Cardiology  Gastroenterology, via phone  Discharge  Exam: Vitals:   08/20/18 2350 08/21/18 0404  BP: (!) 147/79 (!) 124/58  Pulse: 79 73  Resp:    Temp: 97.8 F (36.6 C) (!) 97.5 F (36.4 C)  SpO2: 94% 95%   Patient states his abdominal pain is doing better and is able to swallow a little bit more.  He feels he is ready to go home today.  Patient denies current chest pain, shortness of breath, nausea or vomiting, dizziness or headache.   General: Well developed, well nourished, NAD, appears stated age  HEENT: NCAT, mucous membranes moist.  Cardiovascular: S1 S2 auscultated, no murmur, RRR  Respiratory: Clear to auscultation bilaterally   Abdomen: Soft, nontender, nondistended, + bowel sounds  Extremities: warm dry without cyanosis clubbing or edema of RLE.  L BKA  Neuro: AAOx3, nonfocal  Psych: Pleasant, appropriate mood and affect  Discharge Instructions Discharge Instructions    Discharge instructions   Complete by:  As directed    Patient will be discharged to home.  Patient will need to follow up with primary care provider within one week of discharge.  Follow up with Dr. Matthias HughsBuccini, gastroenterologist. Follow up with cardiology, Dr. Allyson SabalBerry, in 2-4 weeks.  Patient should continue medications as prescribed.  Patient should follow a carb modified diet.     Allergies as of 08/21/2018      Reactions   Nabumetone Itching, Nausea Only, Rash   Reglan [metoclopramide] Hives, Nausea Only   Codeine Other (See Comments)   GI UPSET - hives      Medication List    STOP taking these medications   clopidogrel 75 MG tablet Commonly known as:  PLAVIX   lansoprazole 30 MG capsule Commonly known as:  PREVACID     TAKE these medications   amLODipine 10 MG tablet Commonly known as:  NORVASC Take 1 tablet (10 mg total) by mouth daily.   apixaban 5 MG Tabs tablet Commonly known as:  ELIQUIS Take 1 tablet (5 mg total) by mouth 2  (two) times daily.   aspirin 81 MG EC tablet Take 1 tablet (81 mg total) by mouth daily.   atorvastatin 80 MG tablet Commonly known as:  LIPITOR Take 1 tablet (80 mg total) by mouth daily at 6 PM.   BASAGLAR KWIKPEN 100 UNIT/ML Sopn Inject 40 Units into the skin daily.   ferrous sulfate 325 (65 FE) MG tablet Take 325 mg by mouth every evening.   furosemide 20 MG tablet Commonly known as:  LASIX Take 1 tablet (20 mg total) by mouth daily. What changed:    when to take this  reasons to take this   gabapentin 300 MG capsule Commonly known as:  NEURONTIN Take 300 mg by mouth 4 (four) times daily.   insulin lispro 100 UNIT/ML injection Commonly known as:  HUMALOG Inject 18 Units into the skin 3 (three) times daily with meals.   metFORMIN 500 MG tablet Commonly known as:  GLUCOPHAGE Take 500 mg by mouth 2 (two) times daily with a meal.   metoCLOPramide 10 MG tablet Commonly known as:  REGLAN Take 1 tablet (10 mg total) by mouth every 6 (six) hours for 30 days. Taper back down to your home dose as your symptoms begin to improve. What changed:    medication strength  how much to take  when to take this  additional instructions   metoprolol tartrate 25 MG tablet Commonly known as:  LOPRESSOR Take 1 tablet (25 mg total) by mouth 2 (two) times daily.  nitroGLYCERIN 0.4 MG SL tablet Commonly known as:  NITROSTAT Place 1 tablet (0.4 mg total) under the tongue every 5 (five) minutes as needed for chest pain.   ofloxacin 0.3 % ophthalmic solution Commonly known as:  OCUFLOX Place 2-3 drops into both eyes 4 (four) times daily.   ondansetron 4 MG disintegrating tablet Commonly known as:  ZOFRAN ODT Take 1 tablet (4 mg total) by mouth every 8 (eight) hours as needed for nausea or vomiting.   ONETOUCH VERIO test strip Generic drug:  glucose blood Use to test blood sugar twice daily   pantoprazole 40 MG tablet Commonly known as:  PROTONIX Take 1 tablet (40 mg  total) by mouth 2 (two) times daily before a meal.   prednisoLONE acetate 1 % ophthalmic suspension Commonly known as:  PRED FORTE Place 2-3 drops into both eyes 4 (four) times daily.   promethazine 25 MG suppository Commonly known as:  PHENERGAN Place 1 suppository (25 mg total) rectally every 6 (six) hours as needed for nausea or vomiting.   promethazine 25 MG/ML injection Commonly known as:  PHENERGAN Inject 25 mg into the muscle every 6 (six) hours as needed for nausea or vomiting.   promethazine 25 MG tablet Commonly known as:  PHENERGAN Take 25 mg by mouth every 6 (six) hours as needed for nausea or vomiting.   ranitidine 150 MG tablet Commonly known as:  ZANTAC Take 150 mg by mouth 2 (two) times daily.   ranolazine 500 MG 12 hr tablet Commonly known as:  RANEXA Take 1 tablet (500 mg total) by mouth 2 (two) times daily.   traMADol 50 MG tablet Commonly known as:  ULTRAM Take 100 mg by mouth every 6 (six) hours as needed for moderate pain.      Allergies  Allergen Reactions  . Nabumetone Itching, Nausea Only and Rash  . Reglan [Metoclopramide] Hives and Nausea Only  . Codeine Other (See Comments)    GI UPSET - hives   Follow-up Information    Abelino Derrick, PA-C. Go on 09/04/2018.   Specialties:  Cardiology, Radiology Why:  @10 :30am for hospital follow up with Dr. Hazle Coca PA. Please arrive 15 minutes early  Contact information: [address] [place] [address] [phone]        Bernette Redbird, MD. Schedule an appointment as soon as possible for a visit in 1 week(s).   Specialty:  Gastroenterology Why:  Hospital follow up Contact information: [name] [place] [address] [phone]        [name], MD. Schedule an appointment as soon as possible for a visit in 1 week(s).   Specialty:  Internal Medicine Why:  Hospital follow up Contact information: [address] [place]  [address] [phone]            The results of significant diagnostics from this hospitalization (including imaging, microbiology, ancillary and laboratory) are listed below for reference.    Significant Diagnostic Studies: Dg Abd 1 View  Result Date: 08/17/2018 CLINICAL DATA:  Abdominal pain EXAM: ABDOMEN - 1 VIEW COMPARISON:  01/18/2018 FINDINGS: Scattered large and small bowel gas is noted. No free air is seen. No abnormal mass or abnormal calcifications are noted. IVC filter is again seen. No acute bony abnormality is noted. IMPRESSION: No acute abnormality noted. Electronically Signed   By: Alcide Clever M.D.   On: 08/17/2018 08:53   Ct Abdomen Pelvis W Contrast  Result Date: 08/17/2018 CLINICAL DATA:  Nausea and vomiting EXAM: CT ABDOMEN AND PELVIS WITH CONTRAST TECHNIQUE: Multidetector CT imaging of the abdomen and pelvis was performed using the standard protocol following bolus administration of intravenous contrast. CONTRAST:  OMNIPAQUE IOHEXOL 300 MG/ML  SOLN COMPARISON:  None. FINDINGS: Lower chest: Lung bases are free of acute infiltrate or sizable effusion. Coronary calcifications are noted. Hepatobiliary: Gallbladder has been surgically removed. The liver is fatty infiltrated. Pancreas: Fatty infiltration of the pancreas is seen. No focal mass is noted. Spleen: Spleen is within limits. Adrenals/Urinary Tract: Adrenal glands are within normal limits. Kidneys are well visualized bilaterally. A 3.3 cm cyst is noted in the upper pole of the left kidney. No renal calculi or obstructive changes are noted. The bladder is partially distended. Stomach/Bowel: The appendix is within normal limits. No acute obstructive or inflammatory changes of large or small bowel is noted. Mild thickening of the distal esophagus is noted which may be related underlying reflux. Vascular/Lymphatic: IVC filter is noted. Aortic calcifications are noted without aneurysmal dilatation. No lymphadenopathy is noted.  Reproductive: Prostate is unremarkable. Other: No abdominal wall hernia or abnormality. No abdominopelvic ascites. Musculoskeletal: Degenerative changes of lumbar spine are noted. IMPRESSION: Diffuse thickening in the distal esophagus likely related to reflux disease. Chronic changes without acute abnormality. Electronically Signed   By: Alcide Clever M.D.   On: 08/17/2018 11:52   Dg Chest Portable 1 View  Result Date: 08/16/2018 CLINICAL DATA:  Center chest pain and SOB since this AM. Hx of HTN, DM, CAD, SOB, Pulmonary embolism, OSA, MI. EXAM: PORTABLE CHEST 1 VIEW COMPARISON:  02/03/2018 FINDINGS: Shallow lung inflation. Heart size is normal. Lungs are free of focal consolidations and pleural effusions. No pulmonary edema. IMPRESSION: No active disease. Electronically Signed   By: Norva Pavlov M.D.   On: 08/16/2018 16:12    Microbiology: No results found for this or any previous visit (from the past 240 hour(s)).   Labs: Basic Metabolic Panel: Recent Labs  Lab 08/17/18 0316 08/18/18 0404 08/19/18 0330 08/20/18 0341 08/21/18 0510  NA 136 135 136 137 136  K 4.5 3.5 3.5 3.4* 3.6  CL 98 99 101 101 100  CO2 22 27 23 28 28   GLUCOSE 354* 234* 145* 173* 161*  BUN 27* 23 15 12 12   CREATININE 1.30* 1.04 0.91 0.92 0.93  CALCIUM 9.2 8.7* 8.5* 8.7* 8.8*  MG  --  1.7 1.8 2.0  --    Liver Function Tests: Recent Labs  Lab 08/16/18 1540 08/17/18 1036 08/18/18 0404 08/19/18 0330 08/20/18 0341  AST 23 22 13* 17 19  ALT 19 20 15 13 16   ALKPHOS 81 79 63 55 55  BILITOT QUANTITY NOT SUFFICIENT, UNABLE TO PERFORM TEST 1.5* 0.7 0.8 0.7  PROT 8.2* 8.1 6.7 6.0* 6.3*  ALBUMIN 3.9 3.7 3.1* 2.7* 2.8*   Recent Labs  Lab 08/16/18 1540 08/17/18 1036  LIPASE 18 16   No results for input(s): AMMONIA in the last 168 hours. CBC: Recent Labs  Lab 08/16/18 1540 08/17/18 1036 08/18/18 0404 08/19/18 0330 08/20/18 0341  WBC 10.6* 16.6* 15.8* 9.4 6.7  NEUTROABS 7.8*  --   --   --   --   HGB  15.7 15.5 13.6 12.9* 13.0  HCT 46.9 45.5 40.0 38.4* 39.5  MCV 90.5 89.2 90.3 91.2 91.0  PLT 326 283 241 179 206   Cardiac Enzymes: Recent Labs  Lab 08/16/18 1540 08/16/18 2110 08/17/18 0316 08/17/18 1036  TROPONINI <0.03 <0.03 <0.03 <0.03   BNP:  BNP (last 3 results) Recent Labs    11/01/17 1128  BNP 90.8    ProBNP (last 3 results) No results for input(s): PROBNP in the last 8760 hours.  CBG: Recent Labs  Lab 08/20/18 1714 08/20/18 1953 08/20/18 2352 08/21/18 0400 08/21/18 0729  GLUCAP 234* 235* 209* 148* 142*       Signed:  Luzmaria Devaux  [facility] 08/21/2018, 10:14 AM

## 2018-08-21 NOTE — Consult Note (Signed)
   [facility] Inpatient Consult   08/21/2018  YASHUA GANESAN Jan 19, 1951 [id number]   Update:  Patient will receive his transition of care follow up from his primary care provider. No [facility] Medical illustrator assigned at this point.  For questions, please contact:  [name], RN BSN CCM [facility]  [phone] business mobile phone Toll free office [phone]

## 2018-08-26 DIAGNOSIS — K219 Gastro-esophageal reflux disease without esophagitis: Secondary | ICD-10-CM | POA: Diagnosis not present

## 2018-08-26 DIAGNOSIS — G43A1 Cyclical vomiting, intractable: Secondary | ICD-10-CM | POA: Diagnosis not present

## 2018-08-26 DIAGNOSIS — K3184 Gastroparesis: Secondary | ICD-10-CM | POA: Diagnosis not present

## 2018-08-26 DIAGNOSIS — E11319 Type 2 diabetes mellitus with unspecified diabetic retinopathy without macular edema: Secondary | ICD-10-CM | POA: Diagnosis not present

## 2018-08-26 DIAGNOSIS — I2510 Atherosclerotic heart disease of native coronary artery without angina pectoris: Secondary | ICD-10-CM | POA: Diagnosis not present

## 2018-08-27 ENCOUNTER — Encounter: Payer: Self-pay | Admitting: Pharmacist

## 2018-08-27 ENCOUNTER — Other Ambulatory Visit: Payer: Self-pay | Admitting: Pharmacist

## 2018-08-27 NOTE — Patient Outreach (Signed)
[facility]) Care Management  08/27/2018  Samuel [name] 1951/04/18 [id number]   Patient was called to follow up for case closure. He will be followed by [facility] for [facility] services and they have pharmacy services as well.  Patient reported feeling better and that a nurse was scheduled to come and see him today.  Plan: Close patient's case due to him being active with [facility].  [name], PharmD, BCACP [facility] Clinical Pharmacist [phone]

## 2018-09-03 ENCOUNTER — Ambulatory Visit: Payer: PPO | Admitting: Nurse Practitioner

## 2018-09-03 ENCOUNTER — Inpatient Hospital Stay: Payer: PPO | Admitting: Emergency Medicine

## 2018-09-03 ENCOUNTER — Other Ambulatory Visit: Payer: Self-pay

## 2018-09-03 ENCOUNTER — Encounter: Payer: Self-pay | Admitting: Nurse Practitioner

## 2018-09-03 VITALS — BP 128/70 | HR 88 | Ht 75.0 in

## 2018-09-03 DIAGNOSIS — R0609 Other forms of dyspnea: Secondary | ICD-10-CM | POA: Diagnosis not present

## 2018-09-03 DIAGNOSIS — G4733 Obstructive sleep apnea (adult) (pediatric): Secondary | ICD-10-CM | POA: Diagnosis not present

## 2018-09-03 NOTE — Progress Notes (Signed)
  ID: Samuel [name], male    DOB: 07/19/1950, 68 y.o.   MRN: [id number]  Chief Complaint  Patient presents with   Hospitalization Follow-up    Was told during hospitalization that his O2 dropped into the 80's. Was told to come in for follow up    Referring provider: [name], MD  HPI 68 year old male never smoker with history of PE and OSA who is followed by Dr. Delton Coombes. PMH: Upper lipidemia, DM type II, CHF, leukocytosis  Tests: CXR 08/16/18 - No active disease.  PFT Results Latest Ref Rng & Units 04/09/2017  FVC-Pre L 2.09  FVC-Predicted Pre % 37  Pre FEV1/FVC % % 33  FEV1-Pre L 0.70  FEV1-Predicted Pre % 16    OV 09/04/18 - Hospital follow up Patient presents today for hospital follow-up.  He was admitted to the hospital with nausea, vomiting, abdominal pain/gastroparesis.  He was told while in the hospital that his O2 sats were dropping at night and was advised to follow-up with our office.  Patient has been diagnosed with OSA in the past and tried CPAP at one time but could not tolerate it.  He said he did have a follow-up sleep study at some point that did not show OSA.  Patient last saw Dr. Delton Coombes on 01/24/2018.  Dr. Delton Coombes did want to repeat a sleep study at some point and ordered a full PFT.  He has been doing well since hospital discharge.  He denies any shortness of breath. Denies f/c/s, n/v/d, hemoptysis, PND, leg swelling.    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 of 2012   Shortness of breath    exertion   Type II diabetes mellitus (HCC)     Tobacco History: Social History   Tobacco Use  Smoking Status Never Smoker  Smokeless Tobacco Never Used   Counseling given: Not Answered   Outpatient Encounter Medications as of 09/03/2018  Medication Sig   amLODipine (NORVASC) 10 MG tablet Take 1 tablet (10 mg total) by mouth daily.   apixaban (ELIQUIS) 5 MG TABS tablet Take 1 tablet (5 mg total) by mouth 2 (two) times daily.   aspirin 81 MG EC tablet Take 1 tablet (81 mg total) by mouth daily.   atorvastatin (LIPITOR) 80 MG tablet Take 1 tablet (80 mg total) by mouth daily at 6 PM.   ferrous sulfate 325 (65 FE) MG tablet Take 325 mg by mouth every evening.  furosemide (LASIX) 20 MG tablet Take 1 tablet (20 mg total) by mouth daily. (Patient taking differently: Take 20 mg by mouth daily as needed for fluid. )   gabapentin (NEURONTIN) 300 MG capsule Take 300 mg by mouth 4 (four) times daily.    Insulin Glargine (BASAGLAR KWIKPEN) 100 UNIT/ML SOPN Inject 40 Units into the skin daily.   insulin lispro (HUMALOG) 100 UNIT/ML injection Inject 18 Units into the skin 3 (three) times daily with meals.    metFORMIN (GLUCOPHAGE) 500 MG tablet Take 500 mg by mouth 2 (two) times daily with a meal.   metoCLOPramide (REGLAN) 10 MG tablet Take 1 tablet (10 mg total) by mouth every 6 (six) hours for 30 days. Taper back down to your home dose as your symptoms begin to improve.   metoprolol tartrate (LOPRESSOR) 25 MG tablet Take 1 tablet (25 mg total) by mouth 2 (two)  times daily.   nitroGLYCERIN (NITROSTAT) 0.4 MG SL tablet Place 1 tablet (0.4 mg total) under the tongue every 5 (five) minutes as needed for chest pain.   ofloxacin (OCUFLOX) 0.3 % ophthalmic solution Place 2-3 drops into both eyes 4 (four) times daily.   ondansetron (ZOFRAN ODT) 4 MG disintegrating tablet Take 1 tablet (4 mg total) by mouth every 8 (eight) hours as needed for nausea or vomiting.   ONETOUCH VERIO test strip Use to test blood sugar twice daily   pantoprazole (PROTONIX) 40 MG tablet Take 1 tablet (40 mg total) by mouth 2 (two) times daily before a meal.   prednisoLONE acetate (PRED FORTE) 1 % ophthalmic suspension Place 2-3 drops into both eyes 4 (four) times daily.   promethazine (PHENERGAN) 25 MG suppository Place 1 suppository (25 mg total) rectally every 6 (six) hours as needed for nausea or vomiting.   promethazine (PHENERGAN) 25 MG tablet Take 25 mg by mouth every 6 (six) hours as needed for nausea or vomiting.    promethazine (PHENERGAN) 25 MG/ML injection Inject 25 mg into the muscle every 6 (six) hours as needed for nausea or vomiting.    ranitidine (ZANTAC) 150 MG tablet Take 150 mg by mouth 2 (two) times daily.    ranolazine (RANEXA) 500 MG 12 hr tablet Take 1 tablet (500 mg total) by mouth 2 (two) times daily.   traMADol (ULTRAM) 50 MG tablet Take 100 mg by mouth every 6 (six) hours as needed for moderate pain.    No facility-administered encounter medications on file as of 09/03/2018.      Review of Systems  Review of Systems     Physical Exam  BP 128/70 (BP Location: Right Arm, Patient Position: Sitting, Cuff Size: Normal)    Pulse 88    Ht  (1.905 m)    SpO2 97%    BMI 32.68 kg/m   Wt Readings from Last 5 Encounters:  08/21/18 261 lb 7.5 oz (118.6 kg)  05/20/18 225 lb 15.5 oz (102.5 kg)  05/02/18 226 lb (102.5 kg)  04/22/18 264 lb 8.8 oz (120 kg)  04/18/18 265 lb (120.2 kg)     Physical Exam   Lab Results:  CBC    Component  Value Date/Time   WBC 6.7 08/20/2018 0341   RBC 4.34 08/20/2018 0341   HGB 13.0 08/20/2018 0341   HCT 39.5 08/20/2018 0341   PLT 206 08/20/2018 0341   MCV 91.0 08/20/2018 0341   MCH 30.0 08/20/2018 0341   MCHC 32.9 08/20/2018 0341   RDW 12.2 08/20/2018 0341   LYMPHSABS  2.1 08/16/2018 1540   MONOABS 0.6 08/16/2018 1540   EOSABS 0.1 08/16/2018 1540   BASOSABS 0.0 08/16/2018 1540    BMET    Component Value Date/Time   NA 136 08/21/2018 0510   NA 140 03/21/2015   K 3.6 08/21/2018 0510   CL 100 08/21/2018 0510   CO2 28 08/21/2018 0510   GLUCOSE 161 (H) 08/21/2018 0510   BUN 12 08/21/2018 0510   BUN 20 03/21/2015   CREATININE 0.93 08/21/2018 0510   CREATININE 1.21 01/05/2013 1436   CALCIUM 8.8 (L) 08/21/2018 0510   GFRNONAA >60 08/21/2018 0510   GFRNONAA 65 01/05/2013 1436   GFRAA >60 08/21/2018 0510   GFRAA 75 01/05/2013 1436    BNP    Component Value Date/Time   BNP 90.8 11/01/2017 1128    ProBNP    Component Value Date/Time   PROBNP 954.6 (H) 03/13/2011 1445    Imaging: Dg Abd 1 View  Result Date: 08/17/2018 CLINICAL DATA:  Abdominal pain EXAM: ABDOMEN - 1 VIEW COMPARISON:  01/18/2018 FINDINGS: Scattered large and small bowel gas is noted. No free air is seen. No abnormal mass or abnormal calcifications are noted. IVC filter is again seen. No acute bony abnormality is noted. IMPRESSION: No acute abnormality noted. Electronically Signed   By: Alcide Clever M.D.   On: 08/17/2018 08:53   Ct Abdomen Pelvis W Contrast  Result Date: 08/17/2018 CLINICAL DATA:  Nausea and vomiting EXAM: CT ABDOMEN AND PELVIS WITH CONTRAST TECHNIQUE: Multidetector CT imaging of the abdomen and pelvis was performed using the standard protocol following bolus administration of intravenous contrast. CONTRAST:  OMNIPAQUE IOHEXOL 300 MG/ML  SOLN COMPARISON:  None. FINDINGS: Lower chest: Lung bases are free of acute infiltrate or sizable effusion. Coronary calcifications are noted.  Hepatobiliary: Gallbladder has been surgically removed. The liver is fatty infiltrated. Pancreas: Fatty infiltration of the pancreas is seen. No focal mass is noted. Spleen: Spleen is within limits. Adrenals/Urinary Tract: Adrenal glands are within normal limits. Kidneys are well visualized bilaterally. A 3.3 cm cyst is noted in the upper pole of the left kidney. No renal calculi or obstructive changes are noted. The bladder is partially distended. Stomach/Bowel: The appendix is within normal limits. No acute obstructive or inflammatory changes of large or small bowel is noted. Mild thickening of the distal esophagus is noted which may be related underlying reflux. Vascular/Lymphatic: IVC filter is noted. Aortic calcifications are noted without aneurysmal dilatation. No lymphadenopathy is noted. Reproductive: Prostate is unremarkable. Other: No abdominal wall hernia or abnormality. No abdominopelvic ascites. Musculoskeletal: Degenerative changes of lumbar spine are noted. IMPRESSION: Diffuse thickening in the distal esophagus likely related to reflux disease. Chronic changes without acute abnormality. Electronically Signed   By: Alcide Clever M.D.   On: 08/17/2018 11:52   Dg Chest Portable 1 View  Result Date: 08/16/2018 CLINICAL DATA:  Center chest pain and SOB since this AM. Hx of HTN, DM, CAD, SOB, Pulmonary embolism, OSA, MI. EXAM: PORTABLE CHEST 1 VIEW COMPARISON:  02/03/2018 FINDINGS: Shallow lung inflation. Heart size is normal. Lungs are free of focal consolidations and pleural effusions. No pulmonary edema. IMPRESSION: No active disease. Electronically Signed   By: Norva Pavlov M.D.   On: 08/16/2018 16:12     Assessment & Plan:   OBSTRUCTIVE SLEEP APNEA Patient presents today after recent hospital visit.  He was told while in the hospital that his oxygen level was dropping at night.  He was advised to follow-up with our office.  Patient has tried CPAP at night in the past after being diagnosed  with OSA but could not tolerate the CPAP.  He refuses to have another sleep study at this time and refuses CPAP.  He would be willing to use oxygen at night if needed.  Patient Instructions  Will order ONO Continue current medications  Follow up with Dr. Delton Coombes in 3 months with PFT before appointment on same day  Coronavirus (COVID-19) Are you at risk?  Are you at risk for the Coronavirus (COVID-19)?  To be considered HIGH RISK for Coronavirus (COVID-19), you have to meet the following criteria:   Traveled to [place], [place], [place], [place] or [place]; or in the [place] to [place], [place], [place], or [place]; and have fever, cough, and shortness of breath within the last 2 weeks of travel OR  Been in close contact with a person diagnosed with COVID-19 within the last 2 weeks and have fever, cough, and shortness of breath  IF YOU DO NOT MEET THESE CRITERIA, YOU ARE CONSIDERED LOW RISK FOR COVID-19.  What to do if you are HIGH RISK for COVID-19?   If you are having a medical emergency, call 911.  Seek medical care right away. Before you go to a doctors office, urgent care or emergency department, call ahead and tell them about your recent travel, contact with someone diagnosed with COVID-19, and your symptoms. You should receive instructions from your physicians office regarding next steps of care.   When you arrive at healthcare provider, tell the healthcare staff immediately you have returned from visiting [place], [place], [place], [place] or [place]; or traveled in the [place] to [place], [name], [place], or [place]; in the last two weeks or you have been in close contact with a person diagnosed with COVID-19 in the last 2 weeks.    Tell the health care staff about your symptoms: fever, cough and shortness of breath.  After you have been seen by a medical provider, you will be either: o Tested for (COVID-19) and discharged home on quarantine except to  seek medical care if symptoms worsen, and asked to  - Stay home and avoid contact with others until you get your results (4-5 days)  - Avoid travel on public transportation if possible (such as bus, train, or airplane) or o Sent to the Emergency Department by EMS for evaluation, COVID-19 testing, and possible admission depending on your condition and test results.  What to do if you are LOW RISK for COVID-19?  Reduce your risk of any infection by using the same precautions used for avoiding the common cold or flu:   Wash your hands often with soap and warm water for at least 20 seconds.  If soap and water are not readily available, use an alcohol-based hand sanitizer with at least 60% alcohol.   If coughing or sneezing, cover your mouth and nose by coughing or sneezing into the elbow areas of your shirt or coat, into a tissue or into your sleeve (not your hands).  Avoid shaking hands with others and consider head nods or verbal greetings only.  Avoid touching your eyes, nose, or mouth with unwashed hands.   Avoid close contact with people who are sick.  Avoid places or events with large numbers of people in one location, like concerts or sporting events.  Carefully consider travel plans you have or are making.  If you are planning any travel outside or inside the [place],  visit the [facility] [employer] webpage for the latest health notices.  If you have some symptoms but not all symptoms, continue to monitor at home and seek medical attention if your symptoms worsen.  If you are having a medical emergency, call 911.   ADDITIONAL HEALTHCARE OPTIONS FOR PATIENTS  [facility] / e-Visit: [email or web address]         [facility]: [phone]  [facility]: [phone]                   [facility]: [phone]         Ivonne Andrew, NP 09/04/2018

## 2018-09-03 NOTE — Progress Notes (Deleted)
Cardiology Office Note   Date:  09/03/2018   ID:  Samuel [name], DOB [birth year], MRN [id number]  PCP:  [name], MD  Cardiologist:    No chief complaint on file.    History of Present Illness: Samuel [name] is a 68 y.o. male who presents for post hospital follow up for chest pain, seen for Dr. Allyson Sabal.   Mr. Markum has a prior hx of CAD s/p DES to LAD in 2010, PAD s/p left BKA, DM, OSA, HTN, HLD and gastroparesis who was seen by our service in hospital consultation for the evaluation of chest pain.   He presented to [facility] with c/o chest pain that began on day of presentation in which he had associated nausea, cold sweats and shortness of breath. The episode lasted until he came to the emergency room and he received morphine and Phenergan. Patient reported a similar episode one day prior, that he attributed to his gastroparesis. Patient reported that his pain was similar to his prior MI. He received aspirin, SL NTG and Zofran per EMS prior to arrival which eased the pain significantly. Troponin remained negative.  EKG showed sinus rhythm with chronic inferior T wave inversion minimally, no acute changes.  Hemoglobin A1c 8.7.  On hosptial consultation with cardiology, his symtpoms were thought to be less liekly ACS and more likely in the setting of GI as he presented with a 3 day history of nausea and loss of appetite. He had multiple episode of vomiting and diarrhea then developed sternal chest pressure. He had severe belching. Patient ruled out for ACS. Troponin was negative x3. EKG without acute ischemic changes, he has chronic minimal inferior ST elevation.   Last cath 04/08/17 showed a patent LAD stent,occluded dominant RCA the origin with left right collaterals and a high-grade calcified ostial circumflex stenosis. Turned down for CABG by Dr. Tyrone Sage because of comorbidities recommended medical therapy. He has done well since then.   Today     Nausea, vomiting, abdominal  pain -Suspect secondary to gastroparesis.Patient had gastric emptying study in 2012 which was suggestive of gastroparesis. -Patient symptoms are improving however he continues to have difficulty with oral intake and complains of indigestion and reflux, discomfort with swallowing -Patient was started on IV PPI twice daily, however will place on oral Protonix 40 mg daily to be given 30 minutes prior to meals    Chest pain/ Coronary artery disease/ Peripheralvascular disease -EKG reviewed and appears to be similar to prior -Troponins negative x3 -Suspect secondary to the above -Cardiology is consulted and suspects GI etiology. Recommended to discontinue Plavix as patient is on Eliquis as well as aspirin. Continue statin, amlodipine, metoprolol. Follow-up with Dr. Allyson Sabal in 2 to 4 weeks after discharge   Hyperlipidemia -Continue statin  History of pulmonary embolism -Occurred in 2012, patient has IVC filter(needs outpatient follow-up regarding long-term plans for this-he states that this is permanent) -Continue Eliquis  Chronic diastolic congestive heart failure -Echocardiogram in 2018 showed an EF of 60 to 65%, grade 1 diastolic dysfunction -Lasix was held as patient was receiving IV fluids due to poor oral intake with nausea and vomiting -Monitor intake and output, daily weights  Past Medical History:  Diagnosis Date  . Anxiety   . Arthritis    "all over"   . CAD (coronary artery disease)   . Charcot's joint    "left foot"  . 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 Depression   . Gastroparesis   .  GERD (gastroesophageal reflux disease)   . H/O hiatal hernia   . Hyperlipidemia   . Hypertension   . Myocardial infarction (HCC) 2017/03/27   around this date  . OSA (obstructive sleep apnea)    "not bad enough for a mask"  . Peripheral neuropathy   . Peripheral vascular disease (HCC)   . PONV (postoperative nausea and vomiting)   . Pulmonary embolism  (HCC)    hx. of 2012  . Shortness of breath    exertion  . Type II diabetes mellitus (HCC)     Past Surgical History:  Procedure Laterality Date  . AMPUTATION Left 03/08/2015   Procedure:  LEFT AMPUTATION BELOW KNEE;  Surgeon: Toni Arthurs, MD;  Location: [facility];  Service: Orthopedics;  Laterality: Left;  . APPLICATION OF WOUND VAC Left 01/07/2014  . CHOLECYSTECTOMY  06/2010  . CORONARY ANGIOPLASTY WITH STENT PLACEMENT  05/2009   "1"  . FOOT SURGERY Left 2010   "for Charcot's joint"  . HARDWARE REMOVAL Left 01/07/2014   Procedure: LEFT LEG REMOVAL OF DEEP IMPLANT AND SEQUESTRECTOMY; APPLICATION OF WOUND VAC ;  Surgeon: Toni Arthurs, MD;  Location: [facility] OR;  Service: Orthopedics;  Laterality: Left;  . I&D EXTREMITY Left "multiple"   leg  . 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 IM NAILING TIBIA Left ~ 2012  . LEFT HEART CATH AND CORONARY ANGIOGRAPHY N/A 04/08/2017   Procedure: LEFT HEART CATH AND CORONARY ANGIOGRAPHY;  Surgeon: Runell Gess, MD;  Location: [facility] INVASIVE CV LAB;  Service: Cardiovascular;  Laterality: N/A;  . SHOULDER ARTHROSCOPY W/ ROTATOR CUFF REPAIR Left    "and bone spurs"  . TIBIAL IM ROD REMOVAL Left 01/07/2014  . TOE AMPUTATION Right ~ 2011   "great toe"  . VENA CAVA FILTER PLACEMENT  2012  . VENA CAVA FILTER PLACEMENT N/A 11/20/2016   Procedure: INSERTION VENA-CAVA FILTER;  Surgeon: Sherren Kerns, MD;  Location: [facility] OR;  Service: Vascular;  Laterality: N/A;  . WOUND DEBRIDEMENT Left 01/07/2014   "tibia"     Current Outpatient Medications  Medication Sig Dispense Refill  . amLODipine (NORVASC) 10 MG tablet Take 1 tablet (10 mg total) by mouth daily. 30 tablet 2  . apixaban (ELIQUIS) 5 MG TABS tablet Take 1 tablet (5 mg total) by mouth 2 (two) times daily. 60 tablet 0  . aspirin 81 MG EC tablet Take 1 tablet (81 mg  total) by mouth daily. 30 tablet 0  . atorvastatin (LIPITOR) 80 MG tablet Take 1 tablet (80 mg total) by mouth daily at 6 PM. 30 tablet 0  . ferrous sulfate 325 (65 FE) MG tablet Take 325 mg by mouth every evening.     . furosemide (LASIX) 20 MG tablet Take 1 tablet (20 mg total) by mouth daily. (Patient taking differently: Take 20 mg by mouth daily as needed for fluid. ) 30 tablet 0  . gabapentin (NEURONTIN) 300 MG capsule Take 300 mg by mouth 4 (four) times daily.     . Insulin Glargine (BASAGLAR KWIKPEN) 100 UNIT/ML SOPN Inject 40 Units into the skin daily.    . insulin lispro (HUMALOG) 100 UNIT/ML injection Inject 18 Units into the skin 3 (three) times daily with meals.     . metFORMIN (GLUCOPHAGE) 500 MG tablet Take 500 mg by mouth 2 (two) times daily with a meal.    .  metoCLOPramide (REGLAN) 10 MG tablet Take 1 tablet (10 mg total) by mouth every 6 (six) hours for 30 days. Taper back down to your home dose as your symptoms begin to improve. 120 tablet 0  . metoprolol tartrate (LOPRESSOR) 25 MG tablet Take 1 tablet (25 mg total) by mouth 2 (two) times daily. 60 tablet 0  . nitroGLYCERIN (NITROSTAT) 0.4 MG SL tablet Place 1 tablet (0.4 mg total) under the tongue every 5 (five) minutes as needed for chest pain. 30 tablet 0  . ofloxacin (OCUFLOX) 0.3 % ophthalmic solution Place 2-3 drops into both eyes 4 (four) times daily.    . ondansetron (ZOFRAN ODT) 4 MG disintegrating tablet Take 1 tablet (4 mg total) by mouth every 8 (eight) hours as needed for nausea or vomiting. 10 tablet 0  . ONETOUCH VERIO test strip Use to test blood sugar twice daily  4  . pantoprazole (PROTONIX) 40 MG tablet Take 1 tablet (40 mg total) by mouth 2 (two) times daily before a meal. 60 tablet 0  . prednisoLONE acetate (PRED FORTE) 1 % ophthalmic suspension Place 2-3 drops into both eyes 4 (four) times daily.    . promethazine (PHENERGAN) 25 MG suppository Place 1 suppository (25 mg total) rectally every 6 (six) hours as  needed for nausea or vomiting. 12 each 0  . promethazine (PHENERGAN) 25 MG tablet Take 25 mg by mouth every 6 (six) hours as needed for nausea or vomiting.   5  . promethazine (PHENERGAN) 25 MG/ML injection Inject 25 mg into the muscle every 6 (six) hours as needed for nausea or vomiting.   4  . ranitidine (ZANTAC) 150 MG tablet Take 150 mg by mouth 2 (two) times daily.     . ranolazine (RANEXA) 500 MG 12 hr tablet Take 1 tablet (500 mg total) by mouth 2 (two) times daily. 60 tablet 0  . traMADol (ULTRAM) 50 MG tablet Take 100 mg by mouth every 6 (six) hours as needed for moderate pain.      No current facility-administered medications for this visit.     Allergies:   Nabumetone; Reglan [metoclopramide]; and Codeine    Social History:  The patient  reports that he has never smoked. He has never used smokeless tobacco. He reports that he does not drink alcohol or use drugs.   Family History:  The patient's family history includes Alcoholism in his brother; COPD in his brother and mother; Diabetes in his brother; Heart disease in his brother and mother; Hypertension in his brother; Lung cancer in his mother; Retinal detachment in his father; Stroke in his brother.    ROS:  Please see the history of present illness.   Otherwise, review of systems are positive for none.  All other systems are reviewed and negative.    PHYSICAL EXAM: VS:  There were no vitals taken for this visit. , BMI There is no height or weight on file to calculate BMI.   General: Well developed, well nourished, NAD Skin: Warm, dry, intact  Head: Normocephalic, atraumatic, sclera non-icteric, no xanthomas, clear, moist mucus membranes. Neck: Negative for carotid bruits. No JVD Lungs:Clear to ausculation bilaterally. No wheezes, rales, or rhonchi. Breathing is unlabored. Cardiovascular: RRR with S1 S2. No murmurs, rubs, gallops, or LV heave appreciated. Abdomen: Soft, non-tender, non-distended with normoactive bowel  sounds. No hepatomegaly, No rebound/guarding. No obvious abdominal masses. MSK: Strength and tone appear normal for age. 5/5 in all extremities Extremities: No edema. No clubbing or cyanosis. DP/PT pulses  2+ bilaterally Neuro: Alert and oriented. No focal deficits. No facial asymmetry. MAE spontaneously. Psych: Responds to questions appropriately with normal affect.     EKG:  EKG {ACTION; IS/IS TDD:22025427} ordered today. The ekg ordered today demonstrates ***   Recent Labs: 09/22/2017: TSH 0.953 11/01/2017: B Natriuretic Peptide 90.8 08/20/2018: ALT 16; Hemoglobin 13.0; Magnesium 2.0; Platelets 206 08/21/2018: BUN 12; Creatinine, Ser 0.93; Potassium 3.6; Sodium 136    Lipid Panel    Component Value Date/Time   CHOL 168 08/17/2018 1036   TRIG 230 (H) 08/17/2018 1036   HDL 28 (L) 08/17/2018 1036   CHOLHDL 6.0 08/17/2018 1036   VLDL 46 (H) 08/17/2018 1036   LDLCALC 94 08/17/2018 1036      Wt Readings from Last 3 Encounters:  08/21/18 261 lb 7.5 oz (118.6 kg)  05/20/18 225 lb 15.5 oz (102.5 kg)  05/02/18 226 lb (102.5 kg)      Other studies Reviewed: Additional studies/ records that were reviewed today include:   LEFT HEART CATH AND CORONARY ANGIOGRAPHY 03/2017  Conclusion     Ost RCA to Prox RCA lesion, 100 %stenosed.  Ost Cx to Prox Cx lesion, 80 %stenosed.  Mid LAD lesion, 0 %stenosed.  There is mild left ventricular systolic dysfunction.  LV end diastolic pressure is mildly elevated.  The left ventricular ejection fraction is 50-55% by visual estimate.   IMPRESSION:Mr. Abila has 2 vessel disease. This dominant RCA is occluded at its origin and he fills the distal vessel by left-to-right collaterals (grade 3). He has a high-grade ostial/proximal nondominant circumflex comes off the left main at a 90 angle and is heavily calcified. It gives rise to a large first marginal branch and a smaller second marginal branch. The mid LAD stent is widely patent that was  placed in 2010 by myself. He has low normal EF in the 50% range. I think he is a bypass candidate. Unfortunately his LAD does not have significant disease. The circumflex is highly calcified and would require orbital rotational atherectomy which is somewhat high risk off of the left main within the 90 angle. I'm going to restart IV heparin. We will get the surgeons to evaluate him. The sheath was removed and a TR band was placed on the right wrist to achieve patent hemostasis. The patient left the lab in stable condition.  Echocardiogram 11/2016 Study Conclusions  - Left ventricle: The cavity size was normal. There was moderate concentric hypertrophy. Systolic function was normal. The estimated ejection fraction was in the range of 60% to 65%. Wall motion was normal; there were no regional wall motion abnormalities. Doppler parameters are consistent with abnormal left ventricular relaxation (grade 1 diastolic dysfunction). There was no evidence of elevated ventricular filling pressure by Doppler parameters. - Aortic valve: There was no regurgitation. - Mitral valve: There was no regurgitation. - Left atrium: The atrium was normal in size. - Right ventricle: The cavity size was mildly dilated. Wall thickness was normal. Systolic function was normal. - Tricuspid valve: There was trivial regurgitation. - Pulmonary arteries: Systolic pressure was within the normal range. - Inferior vena cava: The vessel was normal in size. - Pericardium, extracardiac: There was no pericardial effusion  ASSESSMENT AND PLAN:  1.  ***   Current medicines are reviewed at length with the patient today.  The patient {ACTIONS; HAS/DOES NOT HAVE:19233} concerns regarding medicines.  The following changes have been made:  {PLAN; NO CHANGE:13088:s}  Labs/ tests ordered today include: *** No orders of the defined  types were placed in this encounter.    Disposition:   FU with *** in {gen  number 1-61:096045} {Days to years:10300}  Signed, Georgie Chard, NP  09/03/2018 8:35 AM    [facility] [address] [place], [place], [place]  [address] Phone: [phone]; Fax: [phone]

## 2018-09-03 NOTE — Patient Instructions (Addendum)
Will order ONO Continue current medications  Follow up with Dr. Delton Coombes in 3 months with PFT before appointment on same day  Coronavirus (COVID-19) Are you at risk?  Are you at risk for the Coronavirus (COVID-19)?  To be considered HIGH RISK for Coronavirus (COVID-19), you have to meet the following criteria:  . Traveled to [place], [place], [place], [place] or [place]; or in the [place] to [place], [place], [place], or [place]; and have fever, cough, and shortness of breath within the last 2 weeks of travel OR . Been in close contact with a person diagnosed with COVID-19 within the last 2 weeks and have fever, cough, and shortness of breath . IF YOU DO NOT MEET THESE CRITERIA, YOU ARE CONSIDERED LOW RISK FOR COVID-19.  What to do if you are HIGH RISK for COVID-19?  [name] If you are having a medical emergency, call 911. . Seek medical care right away. Before you go to a doctor's office, urgent care or emergency department, call ahead and tell them about your recent travel, contact with someone diagnosed with COVID-19, and your symptoms. You should receive instructions from your physician's office regarding next steps of care.  . When you arrive at healthcare provider, tell the healthcare staff immediately you have returned from visiting [place], [place], [place], [place] or [place]; or traveled in the [place] to [place], [place], [place], or [place]; in the last two weeks or you have been in close contact with a person diagnosed with COVID-19 in the last 2 weeks.   . Tell the health care staff about your symptoms: fever, cough and shortness of breath. . After you have been seen by a medical provider, you will be either: o Tested for (COVID-19) and discharged home on quarantine except to seek medical care if symptoms worsen, and asked to  - Stay home and avoid contact with others until you get your results (4-5 days)  - Avoid travel on public transportation if possible (such  as bus, train, or airplane) or o Sent to the Emergency Department by EMS for evaluation, COVID-19 testing, and possible admission depending on your condition and test results.  What to do if you are LOW RISK for COVID-19?  Reduce your risk of any infection by using the same precautions used for avoiding the common cold or flu:  [name] Wash your hands often with soap and warm water for at least 20 seconds.  If soap and water are not readily available, use an alcohol-based hand sanitizer with at least 60% alcohol.  . If coughing or sneezing, cover your mouth and nose by coughing or sneezing into the elbow areas of your shirt or coat, into a tissue or into your sleeve (not your hands). . Avoid shaking hands with others and consider head nods or verbal greetings only. . Avoid touching your eyes, nose, or mouth with unwashed hands.  . Avoid close contact with people who are sick. . Avoid places or events with large numbers of people in one location, like concerts or sporting events. . Carefully consider travel plans you have or are making. . If you are planning any travel outside or inside the [place], visit the CDC's Travelers' Health webpage for the latest health notices. . If you have some symptoms but not all symptoms, continue to monitor at home and seek medical attention if your symptoms worsen. . If you are having a medical emergency, call 911.   ADDITIONAL HEALTHCARE OPTIONS FOR PATIENTS  [facility] Telehealth / e-Visit: [email or web address]         [facility]: [phone]  [facility]: [phone]                   [facility]: [phone]

## 2018-09-04 ENCOUNTER — Ambulatory Visit: Payer: PPO | Admitting: Cardiology

## 2018-09-04 ENCOUNTER — Encounter: Payer: Self-pay | Admitting: Nurse Practitioner

## 2018-09-04 NOTE — Assessment & Plan Note (Signed)
Patient presents today after recent hospital visit.  He was told while in the hospital that his oxygen level was dropping at night.  He was advised to follow-up with our office.  Patient has tried CPAP at night in the past after being diagnosed with OSA but could not tolerate the CPAP.  He refuses to have another sleep study at this time and refuses CPAP.  He would be willing to use oxygen at night if needed.  Patient Instructions  Will order ONO Continue current medications  Follow up with Dr. Delton Coombes in 3 months with PFT before appointment on same day  Coronavirus (COVID-19) Are you at risk?  Are you at risk for the Coronavirus (COVID-19)?  To be considered HIGH RISK for Coronavirus (COVID-19), you have to meet the following criteria:  . Traveled to [place], [place], [place], [place] or [place]; or in the [place] to [place], [place], [name], or [place]; and have fever, cough, and shortness of breath within the last 2 weeks of travel OR . Been in close contact with a person diagnosed with COVID-19 within the last 2 weeks and have fever, cough, and shortness of breath . IF YOU DO NOT MEET THESE CRITERIA, YOU ARE CONSIDERED LOW RISK FOR COVID-19.  What to do if you are HIGH RISK for COVID-19?  [name] If you are having a medical emergency, call 911. . Seek medical care right away. Before you go to a doctor's office, urgent care or emergency department, call ahead and tell them about your recent travel, contact with someone diagnosed with COVID-19, and your symptoms. You should receive instructions from your physician's office regarding next steps of care.  . When you arrive at healthcare provider, tell the healthcare staff immediately you have returned from visiting [place], [place], [place], [place] or [place]; or traveled in the [place] to [place], [place], [place], or [place]; in the last two weeks or you have been in close contact with a person diagnosed with COVID-19 in the  last 2 weeks.   . Tell the health care staff about your symptoms: fever, cough and shortness of breath. . After you have been seen by a medical provider, you will be either: o Tested for (COVID-19) and discharged home on quarantine except to seek medical care if symptoms worsen, and asked to  - Stay home and avoid contact with others until you get your results (4-5 days)  - Avoid travel on public transportation if possible (such as bus, train, or airplane) or o Sent to the Emergency Department by EMS for evaluation, COVID-19 testing, and possible admission depending on your condition and test results.  What to do if you are LOW RISK for COVID-19?  Reduce your risk of any infection by using the same precautions used for avoiding the common cold or flu:  [name] Wash your hands often with soap and warm water for at least 20 seconds.  If soap and water are not readily available, use an alcohol-based hand sanitizer with at least 60% alcohol.  . If coughing or sneezing, cover your mouth and nose by coughing or sneezing into the elbow areas of your shirt or coat, into a tissue or into your sleeve (not your hands). . Avoid shaking hands with others and consider head nods or verbal greetings only. . Avoid touching your eyes, nose, or mouth with unwashed hands.  . Avoid close contact with people who are sick. . Avoid places or events with large numbers  of people in one location, like concerts or sporting events. . Carefully consider travel plans you have or are making. . If you are planning any travel outside or inside the [place], visit the CDC's Travelers' Health webpage for the latest health notices. . If you have some symptoms but not all symptoms, continue to monitor at home and seek medical attention if your symptoms worsen. . If you are having a medical emergency, call 911.   ADDITIONAL HEALTHCARE OPTIONS FOR PATIENTS   Telehealth / e-Visit: [email or web address]          [facility]: [phone]  [facility]: [phone]                   [facility]: [phone]

## 2018-09-25 ENCOUNTER — Encounter: Payer: Self-pay | Admitting: *Deleted

## 2018-09-25 NOTE — Telephone Encounter (Signed)
This encounter was created in error - please disregard.

## 2018-10-01 ENCOUNTER — Emergency Department (HOSPITAL_COMMUNITY): Payer: PPO

## 2018-10-01 ENCOUNTER — Inpatient Hospital Stay (HOSPITAL_COMMUNITY)
Admission: EM | Admit: 2018-10-01 | Discharge: 2018-10-04 | DRG: 638 | Disposition: A | Discharge status: Home Health | Payer: PPO | Attending: Internal Medicine | Admitting: Internal Medicine

## 2018-10-01 ENCOUNTER — Encounter (HOSPITAL_COMMUNITY): Payer: Self-pay

## 2018-10-01 ENCOUNTER — Other Ambulatory Visit: Payer: Self-pay

## 2018-10-01 DIAGNOSIS — E876 Hypokalemia: Secondary | ICD-10-CM | POA: Diagnosis present

## 2018-10-01 DIAGNOSIS — E860 Dehydration: Secondary | ICD-10-CM | POA: Diagnosis not present

## 2018-10-01 DIAGNOSIS — I2699 Other pulmonary embolism without acute cor pulmonale: Secondary | ICD-10-CM | POA: Diagnosis present

## 2018-10-01 DIAGNOSIS — Z885 Allergy status to narcotic agent status: Secondary | ICD-10-CM

## 2018-10-01 DIAGNOSIS — I2782 Chronic pulmonary embolism: Secondary | ICD-10-CM | POA: Diagnosis not present

## 2018-10-01 DIAGNOSIS — G4733 Obstructive sleep apnea (adult) (pediatric): Secondary | ICD-10-CM | POA: Diagnosis present

## 2018-10-01 DIAGNOSIS — K3184 Gastroparesis: Secondary | ICD-10-CM

## 2018-10-01 DIAGNOSIS — E1143 Type 2 diabetes mellitus with diabetic autonomic (poly)neuropathy: Secondary | ICD-10-CM | POA: Diagnosis present

## 2018-10-01 DIAGNOSIS — E1165 Type 2 diabetes mellitus with hyperglycemia: Secondary | ICD-10-CM | POA: Diagnosis not present

## 2018-10-01 DIAGNOSIS — E1110 Type 2 diabetes mellitus with ketoacidosis without coma: Principal | ICD-10-CM | POA: Diagnosis present

## 2018-10-01 DIAGNOSIS — R112 Nausea with vomiting, unspecified: Secondary | ICD-10-CM | POA: Diagnosis present

## 2018-10-01 DIAGNOSIS — Z66 Do not resuscitate: Secondary | ICD-10-CM | POA: Diagnosis not present

## 2018-10-01 DIAGNOSIS — Z833 Family history of diabetes mellitus: Secondary | ICD-10-CM | POA: Diagnosis not present

## 2018-10-01 DIAGNOSIS — E1142 Type 2 diabetes mellitus with diabetic polyneuropathy: Secondary | ICD-10-CM | POA: Diagnosis present

## 2018-10-01 DIAGNOSIS — Z89519 Acquired absence of unspecified leg below knee: Secondary | ICD-10-CM

## 2018-10-01 DIAGNOSIS — M1990 Unspecified osteoarthritis, unspecified site: Secondary | ICD-10-CM | POA: Diagnosis not present

## 2018-10-01 DIAGNOSIS — I2510 Atherosclerotic heart disease of native coronary artery without angina pectoris: Secondary | ICD-10-CM | POA: Diagnosis present

## 2018-10-01 DIAGNOSIS — E1151 Type 2 diabetes mellitus with diabetic peripheral angiopathy without gangrene: Secondary | ICD-10-CM | POA: Diagnosis present

## 2018-10-01 DIAGNOSIS — Z8249 Family history of ischemic heart disease and other diseases of the circulatory system: Secondary | ICD-10-CM

## 2018-10-01 DIAGNOSIS — N179 Acute kidney failure, unspecified: Secondary | ICD-10-CM | POA: Diagnosis not present

## 2018-10-01 DIAGNOSIS — Z794 Long term (current) use of insulin: Secondary | ICD-10-CM

## 2018-10-01 DIAGNOSIS — I252 Old myocardial infarction: Secondary | ICD-10-CM | POA: Diagnosis not present

## 2018-10-01 DIAGNOSIS — R0902 Hypoxemia: Secondary | ICD-10-CM | POA: Diagnosis not present

## 2018-10-01 DIAGNOSIS — Z825 Family history of asthma and other chronic lower respiratory diseases: Secondary | ICD-10-CM | POA: Diagnosis not present

## 2018-10-01 DIAGNOSIS — Z7901 Long term (current) use of anticoagulants: Secondary | ICD-10-CM

## 2018-10-01 DIAGNOSIS — E785 Hyperlipidemia, unspecified: Secondary | ICD-10-CM | POA: Diagnosis not present

## 2018-10-01 DIAGNOSIS — Z7982 Long term (current) use of aspirin: Secondary | ICD-10-CM

## 2018-10-01 DIAGNOSIS — R1115 Cyclical vomiting syndrome unrelated to migraine: Secondary | ICD-10-CM | POA: Diagnosis not present

## 2018-10-01 DIAGNOSIS — Z886 Allergy status to analgesic agent status: Secondary | ICD-10-CM

## 2018-10-01 DIAGNOSIS — J449 Chronic obstructive pulmonary disease, unspecified: Secondary | ICD-10-CM | POA: Diagnosis not present

## 2018-10-01 DIAGNOSIS — Z79899 Other long term (current) drug therapy: Secondary | ICD-10-CM

## 2018-10-01 DIAGNOSIS — I10 Essential (primary) hypertension: Secondary | ICD-10-CM | POA: Diagnosis not present

## 2018-10-01 DIAGNOSIS — E11610 Type 2 diabetes mellitus with diabetic neuropathic arthropathy: Secondary | ICD-10-CM | POA: Diagnosis not present

## 2018-10-01 DIAGNOSIS — Z888 Allergy status to other drugs, medicaments and biological substances status: Secondary | ICD-10-CM

## 2018-10-01 DIAGNOSIS — R000 Tachycardia, unspecified: Secondary | ICD-10-CM | POA: Diagnosis not present

## 2018-10-01 DIAGNOSIS — Z7952 Long term (current) use of systemic steroids: Secondary | ICD-10-CM

## 2018-10-01 DIAGNOSIS — Z86711 Personal history of pulmonary embolism: Secondary | ICD-10-CM

## 2018-10-01 DIAGNOSIS — K219 Gastro-esophageal reflux disease without esophagitis: Secondary | ICD-10-CM | POA: Diagnosis not present

## 2018-10-01 DIAGNOSIS — Z89512 Acquired absence of left leg below knee: Secondary | ICD-10-CM

## 2018-10-01 LAB — GLUCOSE, CAPILLARY
Glucose-Capillary: 447 mg/dL — ABNORMAL HIGH (ref 70–99)
Glucose-Capillary: 411 mg/dL — ABNORMAL HIGH (ref 70–99)
Glucose-Capillary: 398 mg/dL — ABNORMAL HIGH (ref 70–99)
Glucose-Capillary: 327 mg/dL — ABNORMAL HIGH (ref 70–99)
Glucose-Capillary: 286 mg/dL — ABNORMAL HIGH (ref 70–99)
Glucose-Capillary: 201 mg/dL — ABNORMAL HIGH (ref 70–99)

## 2018-10-01 LAB — BASIC METABOLIC PANEL
Anion gap: 30 — ABNORMAL HIGH (ref 5–15)
Anion gap: 11 (ref 5–15)
BUN: 17 mg/dL (ref 8–23)
BUN: 17 mg/dL (ref 8–23)
CO2: 11 mmol/L — ABNORMAL LOW (ref 22–32)
CO2: 23 mmol/L (ref 22–32)
Calcium: 9.4 mg/dL (ref 8.9–10.3)
Calcium: 9.2 mg/dL (ref 8.9–10.3)
Chloride: 95 mmol/L — ABNORMAL LOW (ref 98–111)
Chloride: 109 mmol/L (ref 98–111)
Creatinine, Ser: 1.53 mg/dL — ABNORMAL HIGH (ref 0.61–1.24)
Creatinine, Ser: 1.24 mg/dL (ref 0.61–1.24)
GFR calc Af Amer: 54 mL/min — ABNORMAL LOW (ref >60)
GFR calc Af Amer: >60 mL/min (ref >60)
GFR calc non Af Amer: 47 mL/min — ABNORMAL LOW (ref >60)
GFR calc non Af Amer: >60 mL/min (ref >60)
Glucose, Bld: 530 mg/dL (ref 70–99)
Glucose, Bld: 261 mg/dL — ABNORMAL HIGH (ref 70–99)
Potassium: 5.1 mmol/L (ref 3.5–5.1)
Potassium: 3.3 mmol/L — ABNORMAL LOW (ref 3.5–5.1)
Sodium: 136 mmol/L (ref 135–145)
Sodium: 143 mmol/L (ref 135–145)

## 2018-10-01 LAB — APTT: aPTT: 46 seconds — ABNORMAL HIGH (ref 24–36)

## 2018-10-01 LAB — CBG MONITORING, ED
Glucose-Capillary: 532 mg/dL (ref 70–99)
Glucose-Capillary: 581 mg/dL (ref 70–99)
Glucose-Capillary: 561 mg/dL (ref 70–99)
Glucose-Capillary: 508 mg/dL (ref 70–99)

## 2018-10-01 LAB — CBC
HCT: 50.3 % (ref 39.0–52.0)
Hemoglobin: 15.6 g/dL (ref 13.0–17.0)
MCH: 30.2 pg (ref 26.0–34.0)
MCHC: 31 g/dL (ref 30.0–36.0)
MCV: 97.5 fL (ref 80.0–100.0)
Platelets: 221 10*3/uL (ref 150–400)
RBC: 5.16 MIL/uL (ref 4.22–5.81)
RDW: 12.7 % (ref 11.5–15.5)
WBC: 11.3 10*3/uL — ABNORMAL HIGH (ref 4.0–10.5)
nRBC: 0 % (ref 0.0–0.2)

## 2018-10-01 LAB — HEPARIN LEVEL (UNFRACTIONATED): Heparin Unfractionated: 0.24 IU/mL — ABNORMAL LOW (ref 0.30–0.70)

## 2018-10-01 MED ORDER — SODIUM CHLORIDE 0.9 % IV SOLN
INTRAVENOUS | Status: DC
  Administered 2018-10-02 – 2018-10-03 (×2): via INTRAVENOUS

## 2018-10-01 MED ORDER — DEXTROSE-NACL 5-0.45 % IV SOLN
INTRAVENOUS | Status: DC
  Administered 2018-10-02: 04:00:00 via INTRAVENOUS

## 2018-10-01 MED ORDER — PROMETHAZINE HCL 25 MG/ML IJ SOLN
12.5000 mg | Freq: Once | INTRAMUSCULAR | Status: AC
  Administered 2018-10-01: 22:00:00 12.5 mg via INTRAVENOUS
  Filled 2018-10-01: qty 1

## 2018-10-01 MED ORDER — DEXTROSE-NACL 5-0.45 % IV SOLN: INTRAVENOUS | Status: DC

## 2018-10-01 MED ORDER — SODIUM CHLORIDE 0.9 % IV SOLN
INTRAVENOUS | Status: AC
  Administered 2018-10-01: 20:00:00 via INTRAVENOUS

## 2018-10-01 MED ORDER — INSULIN REGULAR(HUMAN) IN NACL 100-0.9 UT/100ML-% IV SOLN: INTRAVENOUS | Status: DC

## 2018-10-01 MED ORDER — PROMETHAZINE HCL 25 MG/ML IJ SOLN
12.5000 mg | Freq: Once | INTRAMUSCULAR | Status: AC
  Administered 2018-10-01: 12.5 mg via INTRAVENOUS
  Filled 2018-10-01: qty 1

## 2018-10-01 MED ORDER — ONDANSETRON HCL 4 MG/2ML IJ SOLN
4.0000 mg | Freq: Once | INTRAMUSCULAR | Status: AC
  Administered 2018-10-01: 14:00:00 4 mg via INTRAVENOUS
  Filled 2018-10-01: qty 2

## 2018-10-01 MED ORDER — SODIUM CHLORIDE 0.9 % IV SOLN
INTRAVENOUS | Status: DC
  Administered 2018-10-01: 17:00:00 via INTRAVENOUS

## 2018-10-01 MED ORDER — SODIUM CHLORIDE 0.9 % IV BOLUS
1000.0000 mL | Freq: Once | INTRAVENOUS | Status: AC
  Administered 2018-10-01: 1000 mL via INTRAVENOUS

## 2018-10-01 MED ORDER — ONDANSETRON HCL 4 MG/2ML IJ SOLN
4.0000 mg | Freq: Four times a day (QID) | INTRAMUSCULAR | Status: DC | PRN
  Administered 2018-10-01 – 2018-10-04 (×8): 4 mg via INTRAVENOUS
  Filled 2018-10-01 (×8): qty 2

## 2018-10-01 MED ORDER — HEPARIN (PORCINE) 25000 UT/250ML-% IV SOLN
1600.0000 [IU]/h | INTRAVENOUS | Status: DC
  Administered 2018-10-01: 1600 [IU]/h via INTRAVENOUS
  Administered 2018-10-02: 09:00:00 1800 [IU]/h via INTRAVENOUS
  Administered 2018-10-03: 03:00:00 1600 [IU]/h via INTRAVENOUS
  Filled 2018-10-01 (×3): qty 250

## 2018-10-01 MED ORDER — LORAZEPAM 2 MG/ML IJ SOLN
1.0000 mg | Freq: Once | INTRAMUSCULAR | Status: AC
  Administered 2018-10-01: 14:00:00 1 mg via INTRAVENOUS
  Filled 2018-10-01: qty 1

## 2018-10-01 MED ORDER — INSULIN REGULAR(HUMAN) IN NACL 100-0.9 UT/100ML-% IV SOLN
INTRAVENOUS | Status: DC
  Administered 2018-10-01: 5.2 [IU]/h via INTRAVENOUS
  Administered 2018-10-01: 22:00:00 18.1 [IU]/h via INTRAVENOUS
  Filled 2018-10-01 (×2): qty 100

## 2018-10-01 NOTE — Progress Notes (Signed)
ANTICOAGULATION CONSULT NOTE - Initial Consult  Pharmacy Consult for heparin Indication: Hx PE  Allergies  Allergen Reactions  . Nabumetone Itching, Nausea Only and Rash  . Reglan [Metoclopramide] Hives and Nausea Only  . Codeine Other (See Comments)    GI UPSET - hives    Patient Measurements: Height: 6\' 3"  (190.5 cm) Weight: 261 lb 7.5 oz (118.6 kg) IBW/kg (Calculated) : 84.5 Heparin Dosing Weight: 110kg  Vital Signs: Temp: 98.3 F (36.8 C) (04/15 1129) Temp Source: Oral (04/15 1129) BP: 114/65 (04/15 1445) Pulse Rate: 113 (04/15 1445)  Labs: Recent Labs    10/01/18 1205  HGB 15.6  HCT 50.3  PLT 221  CREATININE 1.53*    Estimated Creatinine Clearance: 66.8 mL/min (A) (by C-G formula based on SCr of 1.53 mg/dL (H)).   Medical History: Past Medical History:  Diagnosis Date  . Anxiety   . Arthritis    "all over"   . CAD (coronary artery disease)   . Charcot's joint    "left foot"  . 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 Depression   . Gastroparesis   . GERD (gastroesophageal reflux disease)   . H/O hiatal hernia   . Hyperlipidemia   . Hypertension   . Myocardial infarction (HCC) 2017/03/27   around this date  . OSA (obstructive sleep apnea)    "not bad enough for a mask"  . Peripheral neuropathy   . Peripheral vascular disease (HCC)   . PONV (postoperative nausea and vomiting)   . Pulmonary embolism (HCC)    hx. of 2012  . Shortness of breath    exertion  . Type II diabetes mellitus (HCC)    Assessment: 47 YOM with hx of PE on apixaban PTA, will be NPO and pharmacy consulted to transition to heparin.  Last dose of apxiban ~24-48h ago per pt report d/t N/V.  CBC wnl.    Goal of Therapy:  Heparin level 0.3-0.7 units/ml aPTT 66-102 seconds Monitor platelets by anticoagulation protocol: Yes   Plan:  Heparin gtt at 1600 units/hr, no bolus d/t uncertainty of last dose 6 hour aPTT/HL  Daylene Posey, PharmD Clinical  Pharmacist Please check AMION for all [facility] numbers 10/01/2018 3:46 PM

## 2018-10-01 NOTE — ED Provider Notes (Signed)
[facility] EMERGENCY DEPARTMENT Provider Note   CSN: [id number] Arrival date & time: 10/01/18  1122    History   Chief Complaint No chief complaint on file.   HPI Samuel [name] is a 68 y.o. male.     Patient brought in by EMS.  Patient has a known history of diabetes.  Patient also has a history of gastroparesis probably secondary to diabetes.  Patient stated that has had nonstop nausea and vomiting for 2 days.  Has not been able to take any of his diabetic meds.  EMS noted that his blood sugars were 455.  Patient stated he has not been on any medications for his diabetes for 2 days.  Patient's medications are significant for the fact that he is on Eliquis.  Patient is on metformin.  And he is also on insulin.  And Lasix.  Past medical history significant for coronary artery disease hypertension hyperlipidemia peripheral neuropathy anxiety peripheral vascular disease type 2 diabetes.  And and myocardial infarction in 2018.  In addition patient's had a left below the knee amputation in 2016.  He had a vena cava filter placement in 2018.  Left heart catheterization in 2018 patient recently seen in the emergency department in February of this year for chest pain and was admitted.  Had an admission in December 2019 for intractable vomiting.     Past Medical History:  Diagnosis Date  . Anxiety   . Arthritis    "all over"   . CAD (coronary artery disease)   . Charcot's joint    "left foot"  . 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 Depression   . Gastroparesis   . GERD (gastroesophageal reflux disease)   . H/O hiatal hernia   . Hyperlipidemia   . Hypertension   . Myocardial infarction (HCC) 2017/03/27   around this date  . OSA (obstructive sleep apnea)    "not bad enough for a mask"  . Peripheral neuropathy   . Peripheral vascular disease (HCC)   . PONV (postoperative nausea and vomiting)   . Pulmonary embolism (HCC)    hx. of 2012  .  Shortness of breath    exertion  . Type II diabetes mellitus [facility])     Patient Active Problem List   Diagnosis Date Noted  . DKA (diabetic ketoacidoses) (HCC) 10/01/2018  . Gastroparesis 08/17/2018  . Chest pain 08/16/2018  . DKA, type 2 (HCC) 02/03/2018  . DOE (dyspnea on exertion) 01/24/2018  . Uncontrolled diabetes mellitus (HCC) 09/23/2017  . DKA, type 2, not at goal [facility]) 09/22/2017  . S/P BKA (below knee amputation) (HCC) 04/19/2017  . COPD (chronic obstructive pulmonary disease) (HCC) 04/19/2017  . NSTEMI (non-ST elevated myocardial infarction) (HCC) 04/07/2017  . Osteomyelitis of foot (HCC)   . Protein-calorie malnutrition, severe (HCC) 03/08/2015  . Osteomyelitis of foot, acute (HCC) 03/07/2015  . Diabetes mellitus type 2, uncontrolled (HCC) 03/07/2015  . Normocytic normochromic anemia 03/07/2015  . Obesity (BMI 30-39.9) 03/07/2015  . Nausea & vomiting   . DM (diabetes mellitus), type 2 (HCC) 01/07/2014  . Chronic osteomyelitis involving lower leg (HCC) 01/07/2014  . Vomiting 12/22/2013  . Diabetic gastroparesis (HCC) 12/21/2013  . Intractable nausea and vomiting 12/21/2013  . Essential hypertension 05/01/2013  . Hyperlipidemia due to type 2 diabetes mellitus (HCC) 05/01/2013  . Generalized weakness 03/20/2013  . Back pain 03/20/2013  . Pulmonary embolism (HCC) 03/27/2011  . PERFORATION OF GALLBLADDER 08/30/2010  . Ulcer of  lower limb, unspecified 03/01/2010  . METHICILLIN SUSCEPTIBLE STAPH AUREUS SEPTICEMIA 01/25/2010  . Acute osteomyelitis, ankle and foot 01/25/2010  . NAUSEA AND VOMITING 01/25/2010  . GERD 11/09/2009  . CAD S/P DES PCI to mLAD: Xience DES 2.75 x 15 (3.0 mm) 05/01/2009  . DIABETES, TYPE 1 05/14/2008  . OBSTRUCTIVE SLEEP APNEA 04/30/2008  . ALLERGIC RHINITIS, SEASONAL 04/30/2008    Past Surgical History:  Procedure Laterality Date  . AMPUTATION Left 03/08/2015   Procedure:  LEFT AMPUTATION BELOW KNEE;  Surgeon: Toni Arthurs, MD;  Location: [facility]  ORS;  Service: Orthopedics;  Laterality: Left;  . APPLICATION OF WOUND VAC Left 01/07/2014  . CHOLECYSTECTOMY  06/2010  . CORONARY ANGIOPLASTY WITH STENT PLACEMENT  05/2009   "1"  . FOOT SURGERY Left 2010   "for Charcot's joint"  . HARDWARE REMOVAL Left 01/07/2014   Procedure: LEFT LEG REMOVAL OF DEEP IMPLANT AND SEQUESTRECTOMY; APPLICATION OF WOUND VAC ;  Surgeon: Toni Arthurs, MD;  Location: [facility] OR;  Service: Orthopedics;  Laterality: Left;  . I&D EXTREMITY Left "multiple"   leg  . 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 IM NAILING TIBIA Left ~ 2012  . LEFT HEART CATH AND CORONARY ANGIOGRAPHY N/A 04/08/2017   Procedure: LEFT HEART CATH AND CORONARY ANGIOGRAPHY;  Surgeon: Runell Gess, MD;  Location: [facility] INVASIVE CV LAB;  Service: Cardiovascular;  Laterality: N/A;  . SHOULDER ARTHROSCOPY W/ ROTATOR CUFF REPAIR Left    "and bone spurs"  . TIBIAL IM ROD REMOVAL Left 01/07/2014  . TOE AMPUTATION Right ~ 2011   "great toe"  . VENA CAVA FILTER PLACEMENT  2012  . VENA CAVA FILTER PLACEMENT N/A 11/20/2016   Procedure: INSERTION VENA-CAVA FILTER;  Surgeon: Sherren Kerns, MD;  Location: [facility] OR;  Service: Vascular;  Laterality: N/A;  . WOUND DEBRIDEMENT Left 01/07/2014   "tibia"        Home Medications    Prior to Admission medications   Medication Sig Start Date End Date Taking? Authorizing Provider  acarbose (PRECOSE) 50 MG tablet Take 50 mg by mouth 3 (three) times daily with meals.   Yes [provider]  amLODipine (NORVASC) 10 MG tablet Take 1 tablet (10 mg total) by mouth daily. 11/22/16  Yes Richarda Overlie, MD  aspirin 81 MG EC tablet Take 1 tablet (81 mg total) by mouth daily. 04/12/17  Yes Glade Lloyd, MD  atorvastatin (LIPITOR) 80 MG tablet Take 1 tablet (80 mg total) by mouth daily at 6 PM. 04/11/17  Yes Hanley Ben, Kshitiz, MD   clopidogrel (PLAVIX) 75 MG tablet Take 75 mg by mouth daily.   Yes [provider]  docusate sodium (COLACE) 100 MG capsule Take 100 mg by mouth daily.   Yes [provider]  ferrous sulfate 325 (65 FE) MG tablet Take 325 mg by mouth every evening.    Yes [provider]  furosemide (LASIX) 20 MG tablet Take 1 tablet (20 mg total) by mouth daily. Patient taking differently: Take 40 mg by mouth daily.  04/12/17  Yes Glade Lloyd, MD  gabapentin (NEURONTIN) 300 MG capsule Take 300 mg by mouth 3 (three) times daily.    Yes [provider]  Insulin Glargine (BASAGLAR KWIKPEN) 100 UNIT/ML SOPN Inject 40 Units into the skin daily.   Yes Averneni, Carmon Sails, MD  insulin lispro (HUMALOG) 100 UNIT/ML injection Inject  5-10 Units into the skin See admin instructions. Per sliding scale 5-10 units three times daily before meals   Yes Averneni, Madhavi, MD  lansoprazole (PREVACID) 30 MG capsule Take 30 mg by mouth daily at 12 noon.   Yes [provider]  metFORMIN (GLUCOPHAGE-XR) 500 MG 24 hr tablet Take 500 mg by mouth 2 (two) times daily.    Yes [provider]  metoCLOPramide (REGLAN) 10 MG tablet Take 1 tablet (10 mg total) by mouth every 6 (six) hours for 30 days. Taper back down to your home dose as your symptoms begin to improve. Patient taking differently: Take 5 mg by mouth 3 (three) times daily.  08/21/18 10/04/18 Yes Mikhail, Nita SellsMaryann, DO  metoprolol tartrate (LOPRESSOR) 25 MG tablet Take 1 tablet (25 mg total) by mouth 2 (two) times daily. 04/11/17  Yes Glade LloydAlekh, Kshitiz, MD  nitroGLYCERIN (NITROSTAT) 0.4 MG SL tablet Place 1 tablet (0.4 mg total) under the tongue every 5 (five) minutes as needed for chest pain. 04/11/17  Yes Glade LloydAlekh, Kshitiz, MD  pantoprazole (PROTONIX) 40 MG tablet Take 1 tablet (40 mg total) by mouth 2 (two) times daily before a meal. 08/21/18  Yes Mikhail, Buchanan DamMaryann, DO  prednisoLONE acetate (PRED FORTE) 1 % ophthalmic suspension Place 1  drop into the left eye 4 (four) times daily.  07/21/18  Yes [provider]  promethazine (PHENERGAN) 25 MG suppository Place 1 suppository (25 mg total) rectally every 6 (six) hours as needed for nausea or vomiting. 04/18/18  Yes Long, Arlyss RepressJoshua G, MD  promethazine (PHENERGAN) 25 MG tablet Take 25-50 mg by mouth every 6 (six) hours as needed for nausea or vomiting.  05/05/18  Yes [provider]  promethazine (PHENERGAN) 25 MG/ML injection Inject 25 mg into the muscle every 6 (six) hours as needed for nausea or vomiting.  04/22/18  Yes [provider]  ranolazine (RANEXA) 500 MG 12 hr tablet Take 1 tablet (500 mg total) by mouth 2 (two) times daily. 04/11/17  Yes Glade LloydAlekh, Kshitiz, MD  traMADol (ULTRAM) 50 MG tablet Take 100 mg by mouth every 6 (six) hours as needed for moderate pain.    Yes [provider]  apixaban (ELIQUIS) 5 MG TABS tablet Take 1 tablet (5 mg total) by mouth 2 (two) times daily. Patient not taking: Reported on 10/04/2018 09/24/17   Osvaldo ShipperKrishnan, Gokul, MD  [facility] test strip Use to test blood sugar twice daily 09/24/17   [provider]    Family History Family History  Problem Relation Age of Onset  . COPD Mother   . Heart disease Mother   . Lung cancer Mother   . Retinal detachment Father   . Alcoholism Brother   . Heart disease Brother   . COPD Brother   . Diabetes Brother   . Hypertension Brother   . Stroke Brother     Social History Social History   Tobacco Use  . Smoking status: Never Smoker  . Smokeless tobacco: Never Used  Substance Use Topics  . Alcohol use: No    Frequency: Never    Comment: 01/07/2014 'might have a wine cooler a couple times/yr"  . Drug use: No     Allergies   Relafen [nabumetone] and Codeine   Review of Systems Review of Systems  Constitutional: Negative for chills and fever.  HENT: Negative for rhinorrhea and sore throat.   Eyes: Negative for visual disturbance.  Respiratory: Negative  for cough and shortness of breath.   Cardiovascular: Negative for  chest pain and leg swelling.  Gastrointestinal: Positive for nausea and vomiting. Negative for abdominal pain and diarrhea.  Genitourinary: Negative for dysuria.  Musculoskeletal: Negative for back pain and neck pain.  Skin: Negative for rash.  Neurological: Negative for dizziness, light-headedness and headaches.  Hematological: Does not bruise/bleed easily.  Psychiatric/Behavioral: Negative for confusion.     Physical Exam Updated Vital Signs BP (!) 156/81   Pulse 70   Temp 98.6 F (37 C) (Oral)   Resp 16   Ht 1.905 m ( )   Wt 114.1 kg   SpO2 99%   BMI 31.45 kg/m   Physical Exam Vitals signs and nursing note reviewed.  Constitutional:      General: He is in acute distress.     Appearance: He is well-developed.  HENT:     Head: Normocephalic and atraumatic.     Mouth/Throat:     Mouth: Mucous membranes are dry.  Eyes:     Extraocular Movements: Extraocular movements intact.     Conjunctiva/sclera: Conjunctivae normal.     Pupils: Pupils are equal, round, and reactive to light.  Neck:     Musculoskeletal: Normal range of motion and neck supple.  Cardiovascular:     Rate and Rhythm: Normal rate and regular rhythm.     Heart sounds: No murmur.  Pulmonary:     Effort: Pulmonary effort is normal. No respiratory distress.     Breath sounds: Normal breath sounds.  Abdominal:     Palpations: Abdomen is soft.     Tenderness: There is no abdominal tenderness.  Musculoskeletal: Normal range of motion.  Skin:    General: Skin is warm and dry.  Neurological:     General: No focal deficit present.     Mental Status: He is alert and oriented to person, place, and time.      ED Treatments / Results  Labs (all labs ordered are listed, but only abnormal results are displayed) Labs Reviewed  BASIC METABOLIC PANEL - Abnormal; Notable for the following components:      Result Value   Chloride 95 (*)     CO2 11 (*)    Glucose, Bld 530 (*)    Creatinine, Ser 1.53 (*)    GFR calc non Af Amer 47 (*)    GFR calc Af Amer 54 (*)    Anion gap 30 (*)    All other components within normal limits  CBC - Abnormal; Notable for the following components:   WBC 11.3 (*)    All other components within normal limits  HEPARIN LEVEL (UNFRACTIONATED) - Abnormal; Notable for the following components:   Heparin Unfractionated 0.24 (*)    All other components within normal limits  APTT - Abnormal; Notable for the following components:   aPTT 46 (*)    All other components within normal limits  GLUCOSE, CAPILLARY - Abnormal; Notable for the following components:   Glucose-Capillary 447 (*)    All other components within normal limits  BASIC METABOLIC PANEL - Abnormal; Notable for the following components:   Potassium 3.3 (*)    Glucose, Bld 261 (*)    All other components within normal limits  BASIC METABOLIC PANEL - Abnormal; Notable for the following components:   Potassium 3.2 (*)    Glucose, Bld 145 (*)    Calcium 8.7 (*)    All other components within normal limits  BASIC METABOLIC PANEL - Abnormal; Notable for the following components:   Potassium 3.4 (*)  CO2 21 (*)    Glucose, Bld 137 (*)    Calcium 8.6 (*)    All other components within normal limits  CBC WITH DIFFERENTIAL/PLATELET - Abnormal; Notable for the following components:   WBC 13.4 (*)    RBC 4.19 (*)    HCT 38.1 (*)    Neutro Abs 11.1 (*)    Monocytes Absolute 1.1 (*)    All other components within normal limits  GLUCOSE, CAPILLARY - Abnormal; Notable for the following components:   Glucose-Capillary 411 (*)    All other components within normal limits  GLUCOSE, CAPILLARY - Abnormal; Notable for the following components:   Glucose-Capillary 398 (*)    All other components within normal limits  GLUCOSE, CAPILLARY - Abnormal; Notable for the following components:   Glucose-Capillary 327 (*)    All other components within  normal limits  GLUCOSE, CAPILLARY - Abnormal; Notable for the following components:   Glucose-Capillary 286 (*)    All other components within normal limits  BASIC METABOLIC PANEL - Abnormal; Notable for the following components:   CO2 19 (*)    Glucose, Bld 224 (*)    Calcium 8.5 (*)    All other components within normal limits  GLUCOSE, CAPILLARY - Abnormal; Notable for the following components:   Glucose-Capillary 201 (*)    All other components within normal limits  GLUCOSE, CAPILLARY - Abnormal; Notable for the following components:   Glucose-Capillary 204 (*)    All other components within normal limits  GLUCOSE, CAPILLARY - Abnormal; Notable for the following components:   Glucose-Capillary 179 (*)    All other components within normal limits  GLUCOSE, CAPILLARY - Abnormal; Notable for the following components:   Glucose-Capillary 141 (*)    All other components within normal limits  GLUCOSE, CAPILLARY - Abnormal; Notable for the following components:   Glucose-Capillary 120 (*)    All other components within normal limits  GLUCOSE, CAPILLARY - Abnormal; Notable for the following components:   Glucose-Capillary 127 (*)    All other components within normal limits  GLUCOSE, CAPILLARY - Abnormal; Notable for the following components:   Glucose-Capillary 147 (*)    All other components within normal limits  GLUCOSE, CAPILLARY - Abnormal; Notable for the following components:   Glucose-Capillary 144 (*)    All other components within normal limits  HEPARIN LEVEL (UNFRACTIONATED) - Abnormal; Notable for the following components:   Heparin Unfractionated 0.73 (*)    All other components within normal limits  GLUCOSE, CAPILLARY - Abnormal; Notable for the following components:   Glucose-Capillary 140 (*)    All other components within normal limits  HEPARIN LEVEL (UNFRACTIONATED) - Abnormal; Notable for the following components:   Heparin Unfractionated 0.73 (*)    All other  components within normal limits  GLUCOSE, CAPILLARY - Abnormal; Notable for the following components:   Glucose-Capillary 256 (*)    All other components within normal limits  CBC - Abnormal; Notable for the following components:   RBC 4.00 (*)    Hemoglobin 12.4 (*)    HCT 36.4 (*)    Platelets 148 (*)    All other components within normal limits  BASIC METABOLIC PANEL - Abnormal; Notable for the following components:   Potassium 3.4 (*)    Glucose, Bld 222 (*)    Calcium 8.2 (*)    All other components within normal limits  GLUCOSE, CAPILLARY - Abnormal; Notable for the following components:   Glucose-Capillary 194 (*)  All other components within normal limits  GLUCOSE, CAPILLARY - Abnormal; Notable for the following components:   Glucose-Capillary 186 (*)    All other components within normal limits  GLUCOSE, CAPILLARY - Abnormal; Notable for the following components:   Glucose-Capillary 196 (*)    All other components within normal limits  GLUCOSE, CAPILLARY - Abnormal; Notable for the following components:   Glucose-Capillary 199 (*)    All other components within normal limits  GLUCOSE, CAPILLARY - Abnormal; Notable for the following components:   Glucose-Capillary 181 (*)    All other components within normal limits  BASIC METABOLIC PANEL - Abnormal; Notable for the following components:   CO2 20 (*)    Glucose, Bld 182 (*)    BUN 7 (*)    Calcium 8.3 (*)    All other components within normal limits  GLUCOSE, CAPILLARY - Abnormal; Notable for the following components:   Glucose-Capillary 255 (*)    All other components within normal limits  GLUCOSE, CAPILLARY - Abnormal; Notable for the following components:   Glucose-Capillary 184 (*)    All other components within normal limits  CBG MONITORING, ED - Abnormal; Notable for the following components:   Glucose-Capillary 532 (*)    All other components within normal limits  CBG MONITORING, ED - Abnormal; Notable  for the following components:   Glucose-Capillary 581 (*)    All other components within normal limits  CBG MONITORING, ED - Abnormal; Notable for the following components:   Glucose-Capillary 561 (*)    All other components within normal limits  CBG MONITORING, ED - Abnormal; Notable for the following components:   Glucose-Capillary 508 (*)    All other components within normal limits  HEPARIN LEVEL (UNFRACTIONATED)  HEPARIN LEVEL (UNFRACTIONATED)  MAGNESIUM  MAGNESIUM    EKG EKG Interpretation  Date/Time:  Wednesday October 01 2018 11:33:43 EDT Ventricular Rate:  96 PR Interval:    QRS Duration: 95 QT Interval:  383 QTC Calculation: 484 R Axis:   30 Text Interpretation:  Sinus rhythm Consider left atrial enlargement ST elevation, consider inferior injury Borderline prolonged QT interval Confirmed by Vanetta Mulders [id number]) on 10/01/2018 11:42:26 AM   Radiology No results found.   Results for orders placed or performed during the hospital encounter of 10/01/18  Basic metabolic panel  Result Value Ref Range   Sodium 136 135 - 145 mmol/L   Potassium 5.1 3.5 - 5.1 mmol/L   Chloride 95 (L) 98 - 111 mmol/L   CO2 11 (L) 22 - 32 mmol/L   Glucose, Bld 530 (HH) 70 - 99 mg/dL   BUN 17 8 - 23 mg/dL   Creatinine, Ser 4.78 (H) 0.61 - 1.24 mg/dL   Calcium 9.4 8.9 - 29.5 mg/dL   GFR calc non Af Amer 47 (L) >60 mL/min   GFR calc Af Amer 54 (L) >60 mL/min   Anion gap 30 (H) 5 - 15  CBC  Result Value Ref Range   WBC 11.3 (H) 4.0 - 10.5 K/uL   RBC 5.16 4.22 - 5.81 MIL/uL   Hemoglobin 15.6 13.0 - 17.0 g/dL   HCT 62.1 30.8 - 65.7 %   MCV 97.5 80.0 - 100.0 fL   MCH 30.2 26.0 - 34.0 pg   MCHC 31.0 30.0 - 36.0 g/dL   RDW 84.6 96.2 - 95.2 %   Platelets 221 150 - 400 K/uL   nRBC 0.0 0.0 - 0.2 %  Heparin level (unfractionated)  Result Value Ref Range  Heparin Unfractionated 0.24 (L) 0.30 - 0.70 IU/mL  APTT  Result Value Ref Range   aPTT 46 (H) 24 - 36 seconds  Glucose, capillary   Result Value Ref Range   Glucose-Capillary 447 (H) 70 - 99 mg/dL  Basic metabolic panel  Result Value Ref Range   Sodium 143 135 - 145 mmol/L   Potassium 3.3 (L) 3.5 - 5.1 mmol/L   Chloride 109 98 - 111 mmol/L   CO2 23 22 - 32 mmol/L   Glucose, Bld 261 (H) 70 - 99 mg/dL   BUN 17 8 - 23 mg/dL   Creatinine, Ser 1.61 0.61 - 1.24 mg/dL   Calcium 9.2 8.9 - 09.6 mg/dL   GFR calc non Af Amer >60 >60 mL/min   GFR calc Af Amer >60 >60 mL/min   Anion gap 11 5 - 15  Basic metabolic panel  Result Value Ref Range   Sodium 143 135 - 145 mmol/L   Potassium 3.2 (L) 3.5 - 5.1 mmol/L   Chloride 109 98 - 111 mmol/L   CO2 23 22 - 32 mmol/L   Glucose, Bld 145 (H) 70 - 99 mg/dL   BUN 19 8 - 23 mg/dL   Creatinine, Ser 0.45 0.61 - 1.24 mg/dL   Calcium 8.7 (L) 8.9 - 10.3 mg/dL   GFR calc non Af Amer >60 >60 mL/min   GFR calc Af Amer >60 >60 mL/min   Anion gap 11 5 - 15  Basic metabolic panel  Result Value Ref Range   Sodium 141 135 - 145 mmol/L   Potassium 3.4 (L) 3.5 - 5.1 mmol/L   Chloride 110 98 - 111 mmol/L   CO2 21 (L) 22 - 32 mmol/L   Glucose, Bld 137 (H) 70 - 99 mg/dL   BUN 19 8 - 23 mg/dL   Creatinine, Ser 4.09 0.61 - 1.24 mg/dL   Calcium 8.6 (L) 8.9 - 10.3 mg/dL   GFR calc non Af Amer >60 >60 mL/min   GFR calc Af Amer >60 >60 mL/min   Anion gap 10 5 - 15  CBC with Differential  Result Value Ref Range   WBC 13.4 (H) 4.0 - 10.5 K/uL   RBC 4.19 (L) 4.22 - 5.81 MIL/uL   Hemoglobin 13.3 13.0 - 17.0 g/dL   HCT 81.1 (L) 91.4 - 78.2 %   MCV 90.9 80.0 - 100.0 fL   MCH 31.7 26.0 - 34.0 pg   MCHC 34.9 30.0 - 36.0 g/dL   RDW 95.6 21.3 - 08.6 %   Platelets 194 150 - 400 K/uL   nRBC 0.0 0.0 - 0.2 %   Neutrophils Relative % 83 %   Neutro Abs 11.1 (H) 1.7 - 7.7 K/uL   Lymphocytes Relative 9 %   Lymphs Abs 1.2 0.7 - 4.0 K/uL   Monocytes Relative 8 %   Monocytes Absolute 1.1 (H) 0.1 - 1.0 K/uL   Eosinophils Relative 0 %   Eosinophils Absolute 0.0 0.0 - 0.5 K/uL   Basophils Relative 0 %    Basophils Absolute 0.0 0.0 - 0.1 K/uL   Immature Granulocytes 0 %   Abs Immature Granulocytes 0.05 0.00 - 0.07 K/uL  Glucose, capillary  Result Value Ref Range   Glucose-Capillary 411 (H) 70 - 99 mg/dL  Glucose, capillary  Result Value Ref Range   Glucose-Capillary 398 (H) 70 - 99 mg/dL  Glucose, capillary  Result Value Ref Range   Glucose-Capillary 327 (H) 70 - 99 mg/dL  Glucose, capillary  Result  Value Ref Range   Glucose-Capillary 286 (H) 70 - 99 mg/dL  Basic metabolic panel  Result Value Ref Range   Sodium 141 135 - 145 mmol/L   Potassium 3.6 3.5 - 5.1 mmol/L   Chloride 110 98 - 111 mmol/L   CO2 19 (L) 22 - 32 mmol/L   Glucose, Bld 224 (H) 70 - 99 mg/dL   BUN 20 8 - 23 mg/dL   Creatinine, Ser 1.61 0.61 - 1.24 mg/dL   Calcium 8.5 (L) 8.9 - 10.3 mg/dL   GFR calc non Af Amer >60 >60 mL/min   GFR calc Af Amer >60 >60 mL/min   Anion gap 12 5 - 15  Glucose, capillary  Result Value Ref Range   Glucose-Capillary 201 (H) 70 - 99 mg/dL  Heparin level (unfractionated)  Result Value Ref Range   Heparin Unfractionated 0.50 0.30 - 0.70 IU/mL  Glucose, capillary  Result Value Ref Range   Glucose-Capillary 204 (H) 70 - 99 mg/dL  Glucose, capillary  Result Value Ref Range   Glucose-Capillary 179 (H) 70 - 99 mg/dL  Glucose, capillary  Result Value Ref Range   Glucose-Capillary 141 (H) 70 - 99 mg/dL  Glucose, capillary  Result Value Ref Range   Glucose-Capillary 120 (H) 70 - 99 mg/dL  Glucose, capillary  Result Value Ref Range   Glucose-Capillary 127 (H) 70 - 99 mg/dL  Glucose, capillary  Result Value Ref Range   Glucose-Capillary 147 (H) 70 - 99 mg/dL  Glucose, capillary  Result Value Ref Range   Glucose-Capillary 144 (H) 70 - 99 mg/dL  Heparin level (unfractionated)  Result Value Ref Range   Heparin Unfractionated 0.73 (H) 0.30 - 0.70 IU/mL  Glucose, capillary  Result Value Ref Range   Glucose-Capillary 140 (H) 70 - 99 mg/dL  Heparin level (unfractionated)  Result  Value Ref Range   Heparin Unfractionated 0.73 (H) 0.30 - 0.70 IU/mL  Glucose, capillary  Result Value Ref Range   Glucose-Capillary 256 (H) 70 - 99 mg/dL  Heparin level (unfractionated)  Result Value Ref Range   Heparin Unfractionated 0.56 0.30 - 0.70 IU/mL  CBC  Result Value Ref Range   WBC 9.2 4.0 - 10.5 K/uL   RBC 4.00 (L) 4.22 - 5.81 MIL/uL   Hemoglobin 12.4 (L) 13.0 - 17.0 g/dL   HCT 09.6 (L) 04.5 - 40.9 %   MCV 91.0 80.0 - 100.0 fL   MCH 31.0 26.0 - 34.0 pg   MCHC 34.1 30.0 - 36.0 g/dL   RDW 81.1 91.4 - 78.2 %   Platelets 148 (L) 150 - 400 K/uL   nRBC 0.0 0.0 - 0.2 %  Basic metabolic panel  Result Value Ref Range   Sodium 137 135 - 145 mmol/L   Potassium 3.4 (L) 3.5 - 5.1 mmol/L   Chloride 106 98 - 111 mmol/L   CO2 22 22 - 32 mmol/L   Glucose, Bld 222 (H) 70 - 99 mg/dL   BUN 12 8 - 23 mg/dL   Creatinine, Ser 9.56 0.61 - 1.24 mg/dL   Calcium 8.2 (L) 8.9 - 10.3 mg/dL   GFR calc non Af Amer >60 >60 mL/min   GFR calc Af Amer >60 >60 mL/min   Anion gap 9 5 - 15  Magnesium  Result Value Ref Range   Magnesium 1.7 1.7 - 2.4 mg/dL  Glucose, capillary  Result Value Ref Range   Glucose-Capillary 194 (H) 70 - 99 mg/dL  Glucose, capillary  Result Value Ref Range  Glucose-Capillary 186 (H) 70 - 99 mg/dL  Glucose, capillary  Result Value Ref Range   Glucose-Capillary 196 (H) 70 - 99 mg/dL  Glucose, capillary  Result Value Ref Range   Glucose-Capillary 199 (H) 70 - 99 mg/dL  Glucose, capillary  Result Value Ref Range   Glucose-Capillary 181 (H) 70 - 99 mg/dL  Basic metabolic panel  Result Value Ref Range   Sodium 137 135 - 145 mmol/L   Potassium 4.0 3.5 - 5.1 mmol/L   Chloride 106 98 - 111 mmol/L   CO2 20 (L) 22 - 32 mmol/L   Glucose, Bld 182 (H) 70 - 99 mg/dL   BUN 7 (L) 8 - 23 mg/dL   Creatinine, Ser 9.98 0.61 - 1.24 mg/dL   Calcium 8.3 (L) 8.9 - 10.3 mg/dL   GFR calc non Af Amer >60 >60 mL/min   GFR calc Af Amer >60 >60 mL/min   Anion gap 11 5 - 15  Magnesium   Result Value Ref Range   Magnesium 1.8 1.7 - 2.4 mg/dL  Glucose, capillary  Result Value Ref Range   Glucose-Capillary 255 (H) 70 - 99 mg/dL  Glucose, capillary  Result Value Ref Range   Glucose-Capillary 184 (H) 70 - 99 mg/dL  CBG monitoring, ED  Result Value Ref Range   Glucose-Capillary 532 (HH) 70 - 99 mg/dL   Comment 1 Document in Chart   CBG monitoring, ED  Result Value Ref Range   Glucose-Capillary 581 (HH) 70 - 99 mg/dL   Comment 1 Notify RN   CBG monitoring, ED  Result Value Ref Range   Glucose-Capillary 561 (HH) 70 - 99 mg/dL   Comment 1 Notify RN   CBG monitoring, ED  Result Value Ref Range   Glucose-Capillary 508 (HH) 70 - 99 mg/dL   Comment 1 Document in Chart    Dg Chest Port 1 View  Result Date: 10/01/2018 CLINICAL DATA:  DKA.  Nausea and vomiting for 1 day. EXAM: PORTABLE CHEST 1 VIEW COMPARISON:  08/16/2018 FINDINGS: The cardiomediastinal silhouette is within normal limits. The lungs are hypoinflated without evidence of airspace consolidation, edema, pleural effusion, pneumothorax. No acute osseous abnormality is seen. IMPRESSION: No active disease. Electronically Signed   By: Sebastian Ache M.D.   On: 10/01/2018 14:09     Procedures Procedures (including critical care time)  CRITICAL CARE Performed by: Vanetta Mulders Total critical care time: 30 minutes Critical care time was exclusive of separately billable procedures and treating other patients. Critical care was necessary to treat or prevent imminent or life-threatening deterioration. Critical care was time spent personally by me on the following activities: development of treatment plan with patient and/or surrogate as well as nursing, discussions with consultants, evaluation of patient's response to treatment, examination of patient, obtaining history from patient or surrogate, ordering and performing treatments and interventions, ordering and review of laboratory studies, ordering and review of  radiographic studies, pulse oximetry and re-evaluation of patient's condition.  Medications Ordered in ED Medications  0.9 %  sodium chloride infusion ( Intravenous Stopped 10/01/18 2030)  sodium chloride 0.9 % bolus 1,000 mL (0 mLs Intravenous Stopped 10/01/18 2254)  ondansetron (ZOFRAN) injection 4 mg (4 mg Intravenous Given 10/01/18 1416)  LORazepam (ATIVAN) injection 1 mg (1 mg Intravenous Given 10/01/18 1416)  promethazine (PHENERGAN) injection 12.5 mg (12.5 mg Intravenous Given 10/01/18 1417)  promethazine (PHENERGAN) injection 12.5 mg (12.5 mg Intravenous Given 10/01/18 2155)  promethazine (PHENERGAN) injection 12.5 mg (12.5 mg Intravenous Given 10/02/18 0553)  potassium chloride 10 mEq in 100 mL IVPB (0 mEq Intravenous Stopping Infusion hung by another clincian 10/03/18 0307)  potassium chloride SA (K-DUR) CR tablet 40 mEq (40 mEq Oral Given 10/03/18 0849)  magnesium sulfate IVPB 2 g 50 mL (0 g Intravenous Stopping Infusion hung by another clincian 10/03/18 2208)     Initial Impression / Assessment and Plan / ED Course  I have reviewed the triage vital signs and the nursing notes.  Pertinent labs & imaging results that were available during my care of the patient were reviewed by me and considered in my medical decision making (see chart for details).       Upon arrival blood sugar here was 532.  Patient with persistent vomiting.  Based on this glucose stabilizer was ordered.  Patient started on IV insulin per protocol.  Chest x-ray had no acute findings.  Patient without any clinical concerns for COVID-19 infection.  Patient's persistent vomiting usually responds best to Phenergan and Ativan.  This was given initially and the Phenergan was repeated.  In addition patient was given IV fluids.  Patient's initial potassium was 5.1.  Probably secondary due to the acidosis.  This will need to be followed carefully.  Patient's initial labs were consistent with DKA.  Patient is a type II diabetic.   Hospitalist contacted for admission.     Final Clinical Impressions(s) / ED Diagnoses   Final diagnoses:  Type 2 diabetes mellitus with ketoacidosis without coma, without long-term current use of insulin (HCC)  Persistent vomiting    ED Discharge Orders         Ordered    Increase activity slowly     10/04/18 0945    Discharge instructions    Comments:  Follow with Primary MD [name], MD in 7 days   Get CBC, CMP, 2 view Chest X ray -  checked  by Primary MD  in 5-7 days   Activity: As tolerated with Full fall precautions use walker/cane & assistance as needed  Disposition Home Low Carb  Accuchecks 4 times/day, Once in AM empty stomach and then before each meal. Log in all results and show them to your Prim.MD in 3 days. If any glucose reading is under 80 or above 300 call your Prim MD immidiately. Follow Low glucose instructions for glucose under 80 as instructed.  Special Instructions: If you have smoked or chewed Tobacco  in the last 2 yrs please stop smoking, stop any regular Alcohol  and or any Recreational drug use.  On your next visit with your primary care physician please Get Medicines reviewed and adjusted.  Please request your Prim.MD to go over all Hospital Tests and Procedure/Radiological results at the follow up, please get all Hospital records sent to your Prim MD by signing hospital release before you go home.  If you experience worsening of your admission symptoms, develop shortness of breath, life threatening emergency, suicidal or homicidal thoughts you must seek medical attention immediately by calling 911 or calling your MD immediately  if symptoms less severe.  You Must read complete instructions/literature along with all the possible adverse reactions/side effects for all the Medicines you take and that have been prescribed to you. Take any new Medicines after you have completely understood and accpet all the possible adverse reactions/side effects.    Do not drive, operate heavy machinery, perform activities at heights, swimming or participation in water activities or provide baby sitting services if your were admitted for syncope or siezures until  you have seen by Primary MD or a Neurologist and advised to do so again.   10/04/18 0945           Vanetta Mulders, MD 10/05/18 [id number]

## 2018-10-01 NOTE — Progress Notes (Signed)
ANTICOAGULATION CONSULT NOTE   Pharmacy Consult for heparin Indication: Hx PE  Allergies  Allergen Reactions  . Nabumetone Itching, Nausea Only and Rash  . Reglan [Metoclopramide] Hives and Nausea Only  . Codeine Other (See Comments)    GI UPSET - hives    Patient Measurements: Height: 6\' 3"  (190.5 cm) Weight: 261 lb 7.5 oz (118.6 kg) IBW/kg (Calculated) : 84.5 Heparin Dosing Weight: 110kg  Vital Signs: Temp: 98.4 F (36.9 C) (04/15 2018) Temp Source: Oral (04/15 2018) BP: 153/90 (04/15 1800) Pulse Rate: 115 (04/15 1800)  Labs: Recent Labs    10/01/18 1205 10/01/18 2237  HGB 15.6  --   HCT 50.3  --   PLT 221  --   APTT  --  46*  HEPARINUNFRC  --  0.24*  CREATININE 1.53*  --     Estimated Creatinine Clearance: 66.8 mL/min (A) (by C-G formula based on SCr of 1.53 mg/dL (H)).   Medical History: Past Medical History:  Diagnosis Date  . Anxiety   . Arthritis    "all over"   . CAD (coronary artery disease)   . Charcot's joint    "left foot"  . 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 Depression   . Gastroparesis   . GERD (gastroesophageal reflux disease)   . H/O hiatal hernia   . Hyperlipidemia   . Hypertension   . Myocardial infarction (HCC) 2017/03/27   around this date  . OSA (obstructive sleep apnea)    "not bad enough for a mask"  . Peripheral neuropathy   . Peripheral vascular disease (HCC)   . PONV (postoperative nausea and vomiting)   . Pulmonary embolism (HCC)    hx. of 2012  . Shortness of breath    exertion  . Type II diabetes mellitus (HCC)    Assessment: 41 YOM with hx of PE on apixaban PTA, will be NPO and pharmacy consulted to transition to heparin.  Last dose of apxiban ~24-48h ago per pt report d/t N/V.  CBC wnl.   Initial heparin level 0.24 units/ml  APTT 46 sec  Goal of Therapy:  Heparin level 0.3-0.7 units/ml aPTT 66-102 seconds Monitor platelets by anticoagulation protocol: Yes   Plan:  Increase heparin  gtt to 1800 units/hr 6 hour HL  Thanks for allowing pharmacy to be a part of this patient's care.  Talbert Cage, PharmD Clinical Pharmacist

## 2018-10-01 NOTE — ED Notes (Signed)
IV team at bedside 

## 2018-10-01 NOTE — H&P (Signed)
History and Physical   Samuel [name] [id number] DOB: July 30, 1950 DOA: 10/01/2018  Referring MD/NP/PA: Dr. Gustavus MessingZukowski  PCP: Pearson GrippeKim, James, MD   Outpatient Specialists: None  Patient coming from: Home  Chief Complaint: Nausea with vomiting  HPI: Samuel BusingJohn D Harm is a 68 y.o. male with medical history significant of uncontrolled diabetes insulin-dependent with recurrent DKA, history of gastroparesis, coronary artery disease, depression with anxiety, peripheral vascular disease status post left BKA, pulmonary embolism on chronic anticoagulation, GERD, and obstructive sleep apnea who presents to the ER with intractable nausea with vomiting as well as dehydration.  Patient has not been able to take his insulin due to the nausea vomiting.  Has some abdominal pain.  Symptoms are consistent with his known gastroparesis.  Denied any fever or chills denied any contact with anybody who is sick.  Patient was found to be in diabetic ketoacidosis with evidence of acute kidney injury and dehydration.  He has been admitted to the hospital for further work-up..  ED Course: Temperature 98.3 blood pressure 161/77 pulse 116, respiratory rate 29 oxygen sat 95% room air.  He has a white count 11.3 otherwise CBC within normal.  Sodium is 136 potassium 5.1 chloride 95 CO2 11 with creatinine 1.53 BUN   17.  Chest x-ray showed no active disease.  Glucose of 530.  Patient diagnosed with diabetic ketoacidosis and is being admitted to the hospital for treatment.  Review of Systems: As per HPI otherwise 10 point review of systems negative.    Past Medical History:  Diagnosis Date  . Anxiety   . Arthritis    "all over"   . CAD (coronary artery disease)   . Charcot's joint    "left foot"  . 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 Depression   . Gastroparesis   . GERD (gastroesophageal reflux disease)   . H/O hiatal hernia   . Hyperlipidemia   . Hypertension   . Myocardial infarction (HCC) 2017/03/27    around this date  . OSA (obstructive sleep apnea)    "not bad enough for a mask"  . Peripheral neuropathy   . Peripheral vascular disease (HCC)   . PONV (postoperative nausea and vomiting)   . Pulmonary embolism (HCC)    hx. of 2012  . Shortness of breath    exertion  . Type II diabetes mellitus (HCC)     Past Surgical History:  Procedure Laterality Date  . AMPUTATION Left 03/08/2015   Procedure:  LEFT AMPUTATION BELOW KNEE;  Surgeon: Toni ArthursJohn Hewitt, MD;  Location: [facility];  Service: Orthopedics;  Laterality: Left;  . APPLICATION OF WOUND VAC Left 01/07/2014  . CHOLECYSTECTOMY  06/2010  . CORONARY ANGIOPLASTY WITH STENT PLACEMENT  05/2009   "1"  . FOOT SURGERY Left 2010   "for Charcot's joint"  . HARDWARE REMOVAL Left 01/07/2014   Procedure: LEFT LEG REMOVAL OF DEEP IMPLANT AND SEQUESTRECTOMY; APPLICATION OF WOUND VAC ;  Surgeon: Toni ArthursJohn Hewitt, MD;  Location: [facility] OR;  Service: Orthopedics;  Laterality: Left;  . I&D EXTREMITY Left "multiple"   leg  . I&D EXTREMITY Left 01/07/2014   Procedure: 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;  Surgeon: Toni ArthursJohn Hewitt, MD;  Location: [facility] OR;  Service: Orthopedics;  Laterality: Left;  . IM NAILING TIBIA Left ~ 2012  . LEFT HEART CATH AND CORONARY ANGIOGRAPHY N/A 04/08/2017   Procedure: LEFT HEART CATH AND CORONARY ANGIOGRAPHY;  Surgeon: Runell GessBerry, Jonathan J, MD;  Location: [facility];  Service: Cardiovascular;  Laterality: N/A;  . SHOULDER ARTHROSCOPY W/ ROTATOR CUFF REPAIR Left    "and bone spurs"  . TIBIAL IM ROD REMOVAL Left 01/07/2014  . TOE AMPUTATION Right ~ 2011   "great toe"  . VENA CAVA FILTER PLACEMENT  2012  . VENA CAVA FILTER PLACEMENT N/A 11/20/2016   Procedure: INSERTION VENA-CAVA FILTER;  Surgeon: Sherren Kerns, MD;  Location: [facility] OR;  Service: Vascular;  Laterality: N/A;  . WOUND DEBRIDEMENT Left 01/07/2014   "tibia"     reports that he has never smoked. He has never used smokeless tobacco. He reports that he does not  drink alcohol or use drugs.  Allergies  Allergen Reactions  . Nabumetone Itching, Nausea Only and Rash  . Reglan [Metoclopramide] Hives and Nausea Only  . Codeine Other (See Comments)    GI UPSET - hives    Family History  Problem Relation Age of Onset  . COPD Mother   . Heart disease Mother   . Lung cancer Mother   . Retinal detachment Father   . Alcoholism Brother   . Heart disease Brother   . COPD Brother   . Diabetes Brother   . Hypertension Brother   . Stroke Brother      Prior to Admission medications   Medication Sig Start Date End Date Taking? Authorizing Provider  amLODipine (NORVASC) 10 MG tablet Take 1 tablet (10 mg total) by mouth daily. 11/22/16   Richarda Overlie, MD  apixaban (ELIQUIS) 5 MG TABS tablet Take 1 tablet (5 mg total) by mouth 2 (two) times daily. 09/24/17   Osvaldo Shipper, MD  aspirin 81 MG EC tablet Take 1 tablet (81 mg total) by mouth daily. 04/12/17   Glade Lloyd, MD  atorvastatin (LIPITOR) 80 MG tablet Take 1 tablet (80 mg total) by mouth daily at 6 PM. 04/11/17   Glade Lloyd, MD  ferrous sulfate 325 (65 FE) MG tablet Take 325 mg by mouth every evening.     [provider]  furosemide (LASIX) 20 MG tablet Take 1 tablet (20 mg total) by mouth daily. Patient taking differently: Take 20 mg by mouth daily as needed for fluid.  04/12/17   Glade Lloyd, MD  gabapentin (NEURONTIN) 300 MG capsule Take 300 mg by mouth 4 (four) times daily.     [provider]  Insulin Glargine (BASAGLAR KWIKPEN) 100 UNIT/ML SOPN Inject 40 Units into the skin daily.    Marlene Lard, MD  insulin lispro (HUMALOG) 100 UNIT/ML injection Inject 18 Units into the skin 3 (three) times daily with meals.     Marlene Lard, MD  metFORMIN (GLUCOPHAGE) 500 MG tablet Take 500 mg by mouth 2 (two) times daily with a meal.    [provider]  metoCLOPramide (REGLAN) 10 MG tablet Take 1 tablet (10 mg total) by mouth every 6 (six) hours for 30 days. Taper  back down to your home dose as your symptoms begin to improve. 08/21/18 09/20/18  Edsel Petrin, DO  metoprolol tartrate (LOPRESSOR) 25 MG tablet Take 1 tablet (25 mg total) by mouth 2 (two) times daily. 04/11/17   Glade Lloyd, MD  nitroGLYCERIN (NITROSTAT) 0.4 MG SL tablet Place 1 tablet (0.4 mg total) under the tongue every 5 (five) minutes as needed for chest pain. 04/11/17   Glade Lloyd, MD  ofloxacin (OCUFLOX) 0.3 % ophthalmic solution Place 2-3 drops into both eyes 4 (four) times daily. 07/21/18   [provider]  ondansetron (ZOFRAN ODT) 4 MG disintegrating  tablet Take 1 tablet (4 mg total) by mouth every 8 (eight) hours as needed for nausea or vomiting. 06/01/18   Luevenia Maxin, Marlynn Perking, PA-C  ONETOUCH VERIO test strip Use to test blood sugar twice daily 09/24/17   [provider]  pantoprazole (PROTONIX) 40 MG tablet Take 1 tablet (40 mg total) by mouth 2 (two) times daily before a meal. 08/21/18   Mikhail, Nita Sells, DO  prednisoLONE acetate (PRED FORTE) 1 % ophthalmic suspension Place 2-3 drops into both eyes 4 (four) times daily. 07/21/18   [provider]  promethazine (PHENERGAN) 25 MG suppository Place 1 suppository (25 mg total) rectally every 6 (six) hours as needed for nausea or vomiting. 04/18/18   Long, Arlyss Repress, MD  promethazine (PHENERGAN) 25 MG tablet Take 25 mg by mouth every 6 (six) hours as needed for nausea or vomiting.  05/05/18   [provider]  promethazine (PHENERGAN) 25 MG/ML injection Inject 25 mg into the muscle every 6 (six) hours as needed for nausea or vomiting.  04/22/18   [provider]  ranitidine (ZANTAC) 150 MG tablet Take 150 mg by mouth 2 (two) times daily.     [provider]  ranolazine (RANEXA) 500 MG 12 hr tablet Take 1 tablet (500 mg total) by mouth 2 (two) times daily. 04/11/17   Glade Lloyd, MD  traMADol (ULTRAM) 50 MG tablet Take 100 mg by mouth every 6 (six) hours as needed for moderate pain.     [provider]    Physical Exam: Vitals:   10/01/18 1200 10/01/18 1215 10/01/18 1300 10/01/18 1430  BP: (!) 143/93 (!) 145/87 134/61 (!) 154/88  Pulse: (!) 101 (!) 105 (!) 102 (!) 116  Resp: (!) 29 (!) 29 17 (!) 29  Temp:      TempSrc:      SpO2: 97% 98% 96% 97%  Weight:      Height:          Constitutional: NAD, calm, uncomfortable, retching Vitals:   10/01/18 1200 10/01/18 1215 10/01/18 1300 10/01/18 1430  BP: (!) 143/93 (!) 145/87 134/61 (!) 154/88  Pulse: (!) 101 (!) 105 (!) 102 (!) 116  Resp: (!) 29 (!) 29 17 (!) 29  Temp:      TempSrc:      SpO2: 97% 98% 96% 97%  Weight:      Height:       Eyes: PERRL, lids and conjunctivae normal ENMT: Mucous membranes are dry. Posterior pharynx clear of any exudate or lesions.Normal dentition.  Neck: normal, supple, no masses, no thyromegaly Respiratory: clear to auscultation bilaterally, no wheezing, no crackles. Normal respiratory effort. No accessory muscle use.  Cardiovascular: Sinus tachycardia no murmurs / rubs / gallops. No extremity edema. 2+ pedal pulses. No carotid bruits.  Abdomen: no tenderness, no masses palpated. No hepatosplenomegaly. Bowel sounds positive.  Musculoskeletal: Status post left BKA,  Skin: no rashes, lesions, ulcers. No induration Neurologic: CN 2-12 grossly intact. Sensation intact, DTR normal. Strength 5/5 in all 4.  Psychiatric: Normal judgment and insight. Alert and oriented x 3. Normal mood.     Labs on Admission: I have personally reviewed following labs and imaging studies  CBC: Recent Labs  Lab 10/01/18 1205  WBC 11.3*  HGB 15.6  HCT 50.3  MCV 97.5  PLT 221   Basic Metabolic Panel: Recent Labs  Lab 10/01/18 1205  NA 136  K 5.1  CL 95*  CO2 11*  GLUCOSE 530*  BUN 17  CREATININE  1.53*  CALCIUM 9.4   GFR: Estimated Creatinine Clearance: 66.8 mL/min (A) (by C-G formula based on SCr of 1.53 mg/dL (H)). Liver Function Tests: No results for input(s): AST, ALT, ALKPHOS,  BILITOT, PROT, ALBUMIN in the last 168 hours. No results for input(s): LIPASE, AMYLASE in the last 168 hours. No results for input(s): AMMONIA in the last 168 hours. Coagulation Profile: No results for input(s): INR, PROTIME in the last 168 hours. Cardiac Enzymes: No results for input(s): CKTOTAL, CKMB, CKMBINDEX, TROPONINI in the last 168 hours. BNP (last 3 results) No results for input(s): PROBNP in the last 8760 hours. HbA1C: No results for input(s): HGBA1C in the last 72 hours. CBG: Recent Labs  Lab 10/01/18 1143 10/01/18 1413  GLUCAP 532* 581*   Lipid Profile: No results for input(s): CHOL, HDL, LDLCALC, TRIG, CHOLHDL, LDLDIRECT in the last 72 hours. Thyroid Function Tests: No results for input(s): TSH, T4TOTAL, FREET4, T3FREE, THYROIDAB in the last 72 hours. Anemia Panel: No results for input(s): VITAMINB12, FOLATE, FERRITIN, TIBC, IRON, RETICCTPCT in the last 72 hours. Urine analysis:    Component Value Date/Time   COLORURINE STRAW (A) 06/01/2018 1442   APPEARANCEUR CLEAR 06/01/2018 1442   LABSPEC 1.012 06/01/2018 1442   PHURINE 8.0 06/01/2018 1442   GLUCOSEU 150 (A) 06/01/2018 1442   HGBUR NEGATIVE 06/01/2018 1442   BILIRUBINUR NEGATIVE 06/01/2018 1442   KETONESUR 5 (A) 06/01/2018 1442   PROTEINUR NEGATIVE 06/01/2018 1442   UROBILINOGEN 1.0 04/05/2015 1155   NITRITE NEGATIVE 06/01/2018 1442   LEUKOCYTESUR NEGATIVE 06/01/2018 1442   Sepsis Labs: @LABRCNTIP (procalcitonin:4,lacticidven:4) )No results found for this or any previous visit (from the past 240 hour(s)).   Radiological Exams on Admission: Dg Chest Port 1 View  Result Date: 10/01/2018 CLINICAL DATA:  DKA.  Nausea and vomiting for 1 day. EXAM: PORTABLE CHEST 1 VIEW COMPARISON:  08/16/2018 FINDINGS: The cardiomediastinal silhouette is within normal limits. The lungs are hypoinflated without evidence of airspace consolidation, edema, pleural effusion, pneumothorax. No acute osseous abnormality is seen.  IMPRESSION: No active disease. Electronically Signed   By: Sebastian Ache M.D.   On: 10/01/2018 14:09    EKG: Independently reviewed.  It shows sinus rhythm with a rate of 96.  Diffuse ST elevation but appears to be unchanged from baseline.  Assessment/Plan Principal Problem:   DKA (diabetic ketoacidoses) (HCC) Active Problems:   OBSTRUCTIVE SLEEP APNEA   GERD   Pulmonary embolism (HCC)   Essential hypertension   Nausea & vomiting   S/P BKA (below knee amputation) (HCC)   COPD (chronic obstructive pulmonary disease) (HCC)   Gastroparesis     #1 diabetic ketoacidosis: Most likely due to patient's inability to take his insulin.  He has not been sick and was unable to take it.  At this point we will admit the patient for inpatient care.  IV insulin with the DKA protocol.  IV fluids.  Serial BMPs.  Serial Accu-Cheks.  Once gap is closed we will transition to his regular subcutaneous insulin.  #2 diabetic gastroparesis: Intractable nausea vomiting most likely from the gastroparesis.  Patient has previously responded to combination Phenergan and Ativan.  We will continue with the same regimen.  Patient will need follow-up with gastroenterology.  #3 GERD: We will continue with PPIs.  We will use IV Protonix acid.  #4 COPD: At baseline.  No significant shortness of breath.  #5 obstructive sleep apnea: CPAP at night.  #6 acute kidney injury: Most likely prerenal.  Hydrate patient and follow renal function  closely.  #7 history of pulmonary embolism: Patient currently on apixaban.  He is going to be n.p.o. except for fluids.  We will use heparin until patient is able to resume diet.   DVT prophylaxis: Heparin Code Status: Full code Family Communication: Patient has no family at bedside Disposition Plan: Home Consults called: None Admission status: Inpatient  Severity of Illness: The appropriate patient status for this patient is INPATIENT. Inpatient status is judged to be reasonable and  necessary in order to provide the required intensity of service to ensure the patient's safety. The patient's presenting symptoms, physical exam findings, and initial radiographic and laboratory data in the context of their chronic comorbidities is felt to place them at high risk for further clinical deterioration. Furthermore, it is not anticipated that the patient will be medically stable for discharge from the hospital within 2 midnights of admission. The following factors support the patient status of inpatient.   " The patient's presenting symptoms include intractable nausea with vomiting. " The worrisome physical exam findings include evidence of dehydration. " The initial radiographic and laboratory data are worrisome because of anion gap of 30 with glucose 530. " The chronic co-morbidities include uncontrolled diabetes.   * I certify that at the point of admission it is my clinical judgment that the patient will require inpatient hospital care spanning beyond 2 midnights from the point of admission due to high intensity of service, high risk for further deterioration and high frequency of surveillance required.Lonia Blood MD [facility] Pager [phone]  If 7PM-7AM, please contact night-coverage [email or web address] Password [facility]  10/01/2018, 3:31 PM

## 2018-10-01 NOTE — ED Notes (Addendum)
ED TO INPATIENT HANDOFF REPORT  ED Nurse Name and Phone #: [phone] Samuel [name]  S Name/Age/Gender Samuel [name] 68 y.o. male Room/Bed: 017C/017C  Code Status   Code Status: Prior  Home/SNF/Other Home Patient oriented to: self, place, time and situation Is this baseline? Yes   Triage Complete: Triage complete  Chief Complaint hyperglycemia  Triage Note Pt hx gastroparesis nausea vomitting x2 days  Hyperglycemic for GEMS sugar 455, normal range is 200's, no meds x2 days    Allergies Allergies  Allergen Reactions  . Nabumetone Itching, Nausea Only and Rash  . Reglan [Metoclopramide] Hives and Nausea Only  . Codeine Other (See Comments)    GI UPSET - hives    Level of Care/Admitting Diagnosis ED Disposition    ED Disposition Condition Comment   Admit  [facility] [[id number]  Level of Care: Progressive [102]  Diagnosis: DKA (diabetic ketoacidoses) (HCC) [098119][[id number]]  Admitting Physician: Rometta EmeryGARBA, MOHAMMAD L [[id number]]  Attending Physician: Rometta EmeryGARBA, MOHAMMAD L [[id number]]  Estimated length of stay: past midnight tomorrow  Certification:: I certify this patient will need inpatient services for at least 2 midnights  Possible Covid Disease Patient Isolation: N/A  PT Class (Do Not Modify): Inpatient [101]  PT Acc Code (Do Not Modify): Private [1]       B Medical/Surgery History Past Medical History:  Diagnosis Date  . Anxiety   . Arthritis    "all over"   . CAD (coronary artery disease)   . Charcot's joint    "left foot"  . 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 Depression   . Gastroparesis   . GERD (gastroesophageal reflux disease)   . H/O hiatal hernia   . Hyperlipidemia   . Hypertension   . Myocardial infarction (HCC) 2017/03/27   around this date  . OSA (obstructive sleep apnea)    "not bad enough for a mask"  . Peripheral neuropathy   . Peripheral vascular disease (HCC)   . PONV (postoperative nausea and vomiting)    . Pulmonary embolism (HCC)    hx. of 2012  . Shortness of breath    exertion  . Type II diabetes mellitus (HCC)    Past Surgical History:  Procedure Laterality Date  . AMPUTATION Left 03/08/2015   Procedure:  LEFT AMPUTATION BELOW KNEE;  Surgeon: Toni ArthursJohn Hewitt, MD;  Location: [facility];  Service: Orthopedics;  Laterality: Left;  . APPLICATION OF WOUND VAC Left 01/07/2014  . CHOLECYSTECTOMY  06/2010  . CORONARY ANGIOPLASTY WITH STENT PLACEMENT  05/2009   "1"  . FOOT SURGERY Left 2010   "for Charcot's joint"  . HARDWARE REMOVAL Left 01/07/2014   Procedure: LEFT LEG REMOVAL OF DEEP IMPLANT AND SEQUESTRECTOMY; APPLICATION OF WOUND VAC ;  Surgeon: Toni ArthursJohn Hewitt, MD;  Location: [facility] OR;  Service: Orthopedics;  Laterality: Left;  . I&D EXTREMITY Left "multiple"   leg  . I&D EXTREMITY Left 01/07/2014   Procedure: 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;  Surgeon: Toni ArthursJohn Hewitt, MD;  Location: [facility] OR;  Service: Orthopedics;  Laterality: Left;  . IM NAILING TIBIA Left ~ 2012  . LEFT HEART CATH AND CORONARY ANGIOGRAPHY N/A 04/08/2017   Procedure: LEFT HEART CATH AND CORONARY ANGIOGRAPHY;  Surgeon: Runell GessBerry, Jonathan J, MD;  Location: [facility] INVASIVE CV LAB;  Service: Cardiovascular;  Laterality: N/A;  . SHOULDER ARTHROSCOPY W/ ROTATOR CUFF REPAIR Left    "and bone spurs"  . TIBIAL IM ROD REMOVAL Left 01/07/2014  . TOE  AMPUTATION Right ~ 2011   "great toe"  . VENA CAVA FILTER PLACEMENT  2012  . VENA CAVA FILTER PLACEMENT N/A 11/20/2016   Procedure: INSERTION VENA-CAVA FILTER;  Surgeon: Sherren Kerns, MD;  Location: [facility] OR;  Service: Vascular;  Laterality: N/A;  . WOUND DEBRIDEMENT Left 01/07/2014   "tibia"     A IV Location/Drains/Wounds Patient Lines/Drains/Airways Status   Active Line/Drains/Airways    Name:   Placement date:   Placement time:   Site:   Days:   Peripheral IV 10/01/18 Left Wrist   10/01/18    1156    Wrist   less than 1   Peripheral IV 10/01/18 Left;Lateral Forearm   10/01/18     1515    Forearm   less than 1          Intake/Output Last 24 hours No intake or output data in the 24 hours ending 10/01/18 1637  Labs/Imaging Results for orders placed or performed during the hospital encounter of 10/01/18 (from the past 48 hour(s))  CBG monitoring, ED     Status: Abnormal   Collection Time: 10/01/18 11:43 AM  Result Value Ref Range   Glucose-Capillary 532 (HH) 70 - 99 mg/dL   Comment 1 Document in Chart   Basic metabolic panel     Status: Abnormal   Collection Time: 10/01/18 12:05 PM  Result Value Ref Range   Sodium 136 135 - 145 mmol/L   Potassium 5.1 3.5 - 5.1 mmol/L    Comment: SLIGHT HEMOLYSIS   Chloride 95 (L) 98 - 111 mmol/L   CO2 11 (L) 22 - 32 mmol/L   Glucose, Bld 530 (HH) 70 - 99 mg/dL    Comment: CRITICAL RESULT CALLED TO, READ BACK BY AND VERIFIED WITH: Yuritzi Kamp,K RN @ 1303 10/01/18 LEONARD,A    BUN 17 8 - 23 mg/dL   Creatinine, Ser 5.28 (H) 0.61 - 1.24 mg/dL   Calcium 9.4 8.9 - 41.3 mg/dL   GFR calc non Af Amer 47 (L) >60 mL/min   GFR calc Af Amer 54 (L) >60 mL/min   Anion gap 30 (H) 5 - 15    Comment: Performed at [facility], [address] [place], [place] [address]  CBC     Status: Abnormal   Collection Time: 10/01/18 12:05 PM  Result Value Ref Range   WBC 11.3 (H) 4.0 - 10.5 K/uL   RBC 5.16 4.22 - 5.81 MIL/uL   Hemoglobin 15.6 13.0 - 17.0 g/dL   HCT 02.7 25.3 - 66.4 %   MCV 97.5 80.0 - 100.0 fL   MCH 30.2 26.0 - 34.0 pg   MCHC 31.0 30.0 - 36.0 g/dL   RDW 40.3 47.4 - 25.9 %   Platelets 221 150 - 400 K/uL   nRBC 0.0 0.0 - 0.2 %    Comment: Performed at [facility], [address] [place], [place] [address]  CBG monitoring, ED     Status: Abnormal   Collection Time: 10/01/18  2:13 PM  Result Value Ref Range   Glucose-Capillary 581 (HH) 70 - 99 mg/dL   Comment 1 Notify RN   CBG monitoring, ED     Status: Abnormal   Collection Time: 10/01/18  3:32 PM  Result Value Ref Range   Glucose-Capillary 561 (HH) 70 - 99 mg/dL    Comment 1 Notify RN   CBG monitoring, ED     Status: Abnormal   Collection Time: 10/01/18  4:30 PM  Result  Value Ref Range   Glucose-Capillary 508 (HH) 70 - 99 mg/dL   Comment 1 Document in Chart    Dg Chest Port 1 View  Result Date: 10/01/2018 CLINICAL DATA:  DKA.  Nausea and vomiting for 1 day. EXAM: PORTABLE CHEST 1 VIEW COMPARISON:  08/16/2018 FINDINGS: The cardiomediastinal silhouette is within normal limits. The lungs are hypoinflated without evidence of airspace consolidation, edema, pleural effusion, pneumothorax. No acute osseous abnormality is seen. IMPRESSION: No active disease. Electronically Signed   By: Sebastian Ache M.D.   On: 10/01/2018 14:09    Pending Labs Unresulted Labs (From admission, onward)    Start     Ordered   10/02/18 0500  Heparin level (unfractionated)  Daily,   R     10/01/18 1558   10/02/18 0500  CBC  Daily,   R     10/01/18 1558   10/01/18 2200  Heparin level (unfractionated)  Once-Timed,   R     10/01/18 1558   10/01/18 2200  APTT  Once,   R     10/01/18 1558   10/01/18 1340  Urinalysis, Routine w reflex microscopic  ONCE - STAT,   R     10/01/18 1339   Signed and Held  Basic metabolic panel  STAT Now then every 4 hours ,   STAT     Signed and Held   Signed and Held  CBC  (heparin)  Once,   R    Comments:  Baseline for heparin therapy IF NOT ALREADY DRAWN.  Notify MD if PLT < 100 K.    Signed and Held   Signed and Held  Creatinine, serum  (heparin)  Once,   R    Comments:  Baseline for heparin therapy IF NOT ALREADY DRAWN.    Signed and Held   Signed and Held  CBC with Differential  Tomorrow morning,   R     Signed and Held          Vitals/Pain Today's Vitals   10/01/18 1215 10/01/18 1300 10/01/18 1430 10/01/18 1445  BP: (!) 145/87 134/61 (!) 154/88 114/65  Pulse: (!) 105 (!) 102 (!) 116 (!) 113  Resp: (!) 29 17 (!) 29 (!) 22  Temp:      TempSrc:      SpO2: 98% 96% 97%   Weight:      Height:        Isolation Precautions No  active isolations  Medications Medications  dextrose 5 %-0.45 % sodium chloride infusion (has no administration in time range)  insulin regular, human (MYXREDLIN) 100 units/ 100 mL infusion (13.4 Units/hr Intravenous Rate/Dose Change 10/01/18 1634)  sodium chloride 0.9 % bolus 1,000 mL (1,000 mLs Intravenous New Bag/Given 10/01/18 1417)    And  0.9 %  sodium chloride infusion ( Intravenous New Bag/Given 10/01/18 1636)  heparin ADULT infusion 100 units/mL (25000 units/258mL sodium chloride 0.45%) (1,600 Units/hr Intravenous New Bag/Given 10/01/18 1636)  ondansetron (ZOFRAN) injection 4 mg (4 mg Intravenous Given 10/01/18 1416)  LORazepam (ATIVAN) injection 1 mg (1 mg Intravenous Given 10/01/18 1416)  promethazine (PHENERGAN) injection 12.5 mg (12.5 mg Intravenous Given 10/01/18 1417)    Mobility non-ambulatory  High fall risk   Focused Assessments    R Recommendations: See Admitting Provider Note  Report given to:   Additional Notes:

## 2018-10-01 NOTE — ED Triage Notes (Signed)
Pt hx gastroparesis nausea vomitting x2 days  Hyperglycemic for GEMS sugar 455, normal range is 200's, no meds x2 days

## 2018-10-01 NOTE — ED Notes (Addendum)
CBG 532. MD Zackowski notified.

## 2018-10-02 DIAGNOSIS — I2782 Chronic pulmonary embolism: Secondary | ICD-10-CM

## 2018-10-02 DIAGNOSIS — J449 Chronic obstructive pulmonary disease, unspecified: Secondary | ICD-10-CM

## 2018-10-02 LAB — CBC WITH DIFFERENTIAL/PLATELET
Abs Immature Granulocytes: 0.05 10*3/uL (ref 0.00–0.07)
Basophils Absolute: 0 10*3/uL (ref 0.0–0.1)
Basophils Relative: 0 %
Eosinophils Absolute: 0 10*3/uL (ref 0.0–0.5)
Eosinophils Relative: 0 %
HCT: 38.1 % — ABNORMAL LOW (ref 39.0–52.0)
Hemoglobin: 13.3 g/dL (ref 13.0–17.0)
Immature Granulocytes: 0 %
Lymphocytes Relative: 9 %
Lymphs Abs: 1.2 10*3/uL (ref 0.7–4.0)
MCH: 31.7 pg (ref 26.0–34.0)
MCHC: 34.9 g/dL (ref 30.0–36.0)
MCV: 90.9 fL (ref 80.0–100.0)
Monocytes Absolute: 1.1 10*3/uL — ABNORMAL HIGH (ref 0.1–1.0)
Monocytes Relative: 8 %
Neutro Abs: 11.1 10*3/uL — ABNORMAL HIGH (ref 1.7–7.7)
Neutrophils Relative %: 83 %
Platelets: 194 10*3/uL (ref 150–400)
RBC: 4.19 MIL/uL — ABNORMAL LOW (ref 4.22–5.81)
RDW: 13.1 % (ref 11.5–15.5)
WBC: 13.4 10*3/uL — ABNORMAL HIGH (ref 4.0–10.5)
nRBC: 0 % (ref 0.0–0.2)

## 2018-10-02 LAB — GLUCOSE, CAPILLARY
Glucose-Capillary: 120 mg/dL — ABNORMAL HIGH (ref 70–99)
Glucose-Capillary: 127 mg/dL — ABNORMAL HIGH (ref 70–99)
Glucose-Capillary: 140 mg/dL — ABNORMAL HIGH (ref 70–99)
Glucose-Capillary: 141 mg/dL — ABNORMAL HIGH (ref 70–99)
Glucose-Capillary: 144 mg/dL — ABNORMAL HIGH (ref 70–99)
Glucose-Capillary: 147 mg/dL — ABNORMAL HIGH (ref 70–99)
Glucose-Capillary: 179 mg/dL — ABNORMAL HIGH (ref 70–99)
Glucose-Capillary: 186 mg/dL — ABNORMAL HIGH (ref 70–99)
Glucose-Capillary: 194 mg/dL — ABNORMAL HIGH (ref 70–99)
Glucose-Capillary: 204 mg/dL — ABNORMAL HIGH (ref 70–99)
Glucose-Capillary: 256 mg/dL — ABNORMAL HIGH (ref 70–99)

## 2018-10-02 LAB — BASIC METABOLIC PANEL
Anion gap: 11 (ref 5–15)
Anion gap: 10 (ref 5–15)
Anion gap: 12 (ref 5–15)
BUN: 19 mg/dL (ref 8–23)
BUN: 19 mg/dL (ref 8–23)
BUN: 20 mg/dL (ref 8–23)
CO2: 23 mmol/L (ref 22–32)
CO2: 21 mmol/L — ABNORMAL LOW (ref 22–32)
CO2: 19 mmol/L — ABNORMAL LOW (ref 22–32)
Calcium: 8.7 mg/dL — ABNORMAL LOW (ref 8.9–10.3)
Calcium: 8.6 mg/dL — ABNORMAL LOW (ref 8.9–10.3)
Calcium: 8.5 mg/dL — ABNORMAL LOW (ref 8.9–10.3)
Chloride: 109 mmol/L (ref 98–111)
Chloride: 110 mmol/L (ref 98–111)
Chloride: 110 mmol/L (ref 98–111)
Creatinine, Ser: 1.02 mg/dL (ref 0.61–1.24)
Creatinine, Ser: 0.98 mg/dL (ref 0.61–1.24)
Creatinine, Ser: 1.1 mg/dL (ref 0.61–1.24)
GFR calc Af Amer: >60 mL/min (ref >60)
GFR calc Af Amer: >60 mL/min (ref >60)
GFR calc Af Amer: >60 mL/min (ref >60)
GFR calc non Af Amer: >60 mL/min (ref >60)
GFR calc non Af Amer: >60 mL/min (ref >60)
GFR calc non Af Amer: >60 mL/min (ref >60)
Glucose, Bld: 145 mg/dL — ABNORMAL HIGH (ref 70–99)
Glucose, Bld: 137 mg/dL — ABNORMAL HIGH (ref 70–99)
Glucose, Bld: 224 mg/dL — ABNORMAL HIGH (ref 70–99)
Potassium: 3.2 mmol/L — ABNORMAL LOW (ref 3.5–5.1)
Potassium: 3.4 mmol/L — ABNORMAL LOW (ref 3.5–5.1)
Potassium: 3.6 mmol/L (ref 3.5–5.1)
Sodium: 143 mmol/L (ref 135–145)
Sodium: 141 mmol/L (ref 135–145)
Sodium: 141 mmol/L (ref 135–145)

## 2018-10-02 LAB — HEPARIN LEVEL (UNFRACTIONATED)
Heparin Unfractionated: 0.5 IU/mL (ref 0.30–0.70)
Heparin Unfractionated: 0.73 IU/mL — ABNORMAL HIGH (ref 0.30–0.70)
Heparin Unfractionated: 0.73 IU/mL — ABNORMAL HIGH (ref 0.30–0.70)

## 2018-10-02 MED ORDER — PROMETHAZINE HCL 25 MG/ML IJ SOLN
12.5000 mg | Freq: Once | INTRAMUSCULAR | Status: AC
  Administered 2018-10-02: 12.5 mg via INTRAVENOUS
  Filled 2018-10-02: qty 1

## 2018-10-02 MED ORDER — PANTOPRAZOLE SODIUM 40 MG PO TBEC
40.0000 mg | DELAYED_RELEASE_TABLET | Freq: Every day | ORAL | Status: DC
  Administered 2018-10-02 – 2018-10-04 (×3): 40 mg via ORAL
  Filled 2018-10-02 (×3): qty 1

## 2018-10-02 MED ORDER — INSULIN ASPART 100 UNIT/ML ~~LOC~~ SOLN
0.0000 [IU] | Freq: Every day | SUBCUTANEOUS | Status: DC
  Administered 2018-10-03: 3 [IU] via SUBCUTANEOUS

## 2018-10-02 MED ORDER — POTASSIUM CHLORIDE CRYS ER 20 MEQ PO TBCR
40.0000 meq | EXTENDED_RELEASE_TABLET | Freq: Once | ORAL | Status: DC
  Filled 2018-10-02: qty 2

## 2018-10-02 MED ORDER — POTASSIUM CHLORIDE 10 MEQ/100ML IV SOLN
10.0000 meq | INTRAVENOUS | Status: AC
  Administered 2018-10-02 (×4): 10 meq via INTRAVENOUS
  Filled 2018-10-02 (×4): qty 100

## 2018-10-02 MED ORDER — METOCLOPRAMIDE HCL 5 MG/ML IJ SOLN
10.0000 mg | Freq: Four times a day (QID) | INTRAMUSCULAR | Status: DC
  Administered 2018-10-02 – 2018-10-04 (×8): 10 mg via INTRAVENOUS
  Filled 2018-10-02 (×8): qty 2

## 2018-10-02 MED ORDER — INSULIN ASPART 100 UNIT/ML ~~LOC~~ SOLN
0.0000 [IU] | Freq: Three times a day (TID) | SUBCUTANEOUS | Status: DC
  Administered 2018-10-02: 8 [IU] via SUBCUTANEOUS
  Administered 2018-10-02 – 2018-10-04 (×5): 3 [IU] via SUBCUTANEOUS

## 2018-10-02 MED ORDER — INSULIN GLARGINE 100 UNIT/ML ~~LOC~~ SOLN
30.0000 [IU] | Freq: Every day | SUBCUTANEOUS | Status: DC
  Administered 2018-10-02 – 2018-10-04 (×3): 30 [IU] via SUBCUTANEOUS
  Filled 2018-10-02 (×4): qty 0.3

## 2018-10-02 MED ORDER — NYSTATIN 100000 UNIT/GM EX POWD
Freq: Two times a day (BID) | CUTANEOUS | Status: DC
  Administered 2018-10-02 – 2018-10-04 (×5): via TOPICAL
  Filled 2018-10-02: qty 15

## 2018-10-02 NOTE — Progress Notes (Signed)
ANTICOAGULATION CONSULT NOTE - Follow-Up  Pharmacy Consult for heparin Indication: Hx PE (Apixaban on hold)  Allergies  Allergen Reactions  . Nabumetone Itching, Nausea Only and Rash  . Reglan [Metoclopramide] Hives and Nausea Only  . Codeine Other (See Comments)    GI UPSET - hives    Patient Measurements: Height: 6\' 3"  (190.5 cm) Weight: 261 lb 7.5 oz (118.6 kg) IBW/kg (Calculated) : 84.5 Heparin Dosing Weight: 110kg  Vital Signs: Temp: 97.8 F (36.6 C) (04/16 0820) Temp Source: Oral (04/16 0820) BP: 162/91 (04/16 1800) Pulse Rate: 82 (04/16 1800)  Labs: Recent Labs    10/01/18 1205  10/01/18 2237 10/02/18 0228 10/02/18 0513 10/02/18 1130 10/02/18 1828  HGB 15.6  --   --   --  13.3  --   --   HCT 50.3  --   --   --  38.1*  --   --   PLT 221  --   --   --  194  --   --   APTT  --   --  46*  --   --   --   --   HEPARINUNFRC  --    < > 0.24*  --  0.50 0.73* 0.73*  CREATININE 1.53*  --  1.24 1.02 0.98 1.10  --    < > = values in this interval not displayed.    Estimated Creatinine Clearance: 92.9 mL/min (by C-G formula based on SCr of 1.1 mg/dL).   Medical History: Past Medical History:  Diagnosis Date  . Anxiety   . Arthritis    "all over"   . CAD (coronary artery disease)   . Charcot's joint    "left foot"  . 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 Depression   . Gastroparesis   . GERD (gastroesophageal reflux disease)   . H/O hiatal hernia   . Hyperlipidemia   . Hypertension   . Myocardial infarction (HCC) 2017/03/27   around this date  . OSA (obstructive sleep apnea)    "not bad enough for a mask"  . Peripheral neuropathy   . Peripheral vascular disease (HCC)   . PONV (postoperative nausea and vomiting)   . Pulmonary embolism (HCC)    hx. of 2012  . Shortness of breath    exertion  . Type II diabetes mellitus [facility])    Assessment: 68 yo M with hx of PE on apixaban PTA, who is NPO due to prolonged N/V thought 2/2 DM  gastroparesis.  Pharmacy was consulted to transition to heparin.  Last dose of apxiban ~24-48h prior to admission per pt report.  CBC wnl.    Heparin level remains slightly above goal on 1700 units/hr.  No bleeding noted.  Goal of Therapy:  Heparin level 0.3-0.7 Monitor platelets by anticoagulation protocol: Yes   Plan:  Decrease heparin to 1600 units/hr Next heparin level with AM labs.  [employer], Pharm.D., BCPS Clinical Pharmacist  **Pharmacist phone directory can now be found on [email or web address] ([email or web address]).  Listed under [facility].  10/02/2018 8:04 PM

## 2018-10-02 NOTE — Progress Notes (Signed)
ANTICOAGULATION CONSULT NOTE   Pharmacy Consult for heparin Indication: Hx PE  Allergies  Allergen Reactions  . Nabumetone Itching, Nausea Only and Rash  . Reglan [Metoclopramide] Hives and Nausea Only  . Codeine Other (See Comments)    GI UPSET - hives    Patient Measurements: Height: 6\' 3"  (190.5 cm) Weight: 261 lb 7.5 oz (118.6 kg) IBW/kg (Calculated) : 84.5 Heparin Dosing Weight: 110kg  Vital Signs: Temp: 97.8 F (36.6 C) (04/16 0820) Temp Source: Oral (04/16 0820) Pulse Rate: 77 (04/16 0820)  Labs: Recent Labs    10/01/18 1205 10/01/18 2237 10/02/18 0228 10/02/18 0513 10/02/18 1130  HGB 15.6  --   --  13.3  --   HCT 50.3  --   --  38.1*  --   PLT 221  --   --  194  --   APTT  --  46*  --   --   --   HEPARINUNFRC  --  0.24*  --  0.50 0.73*  CREATININE 1.53* 1.24 1.02 0.98 1.10    Estimated Creatinine Clearance: 92.9 mL/min (by C-G formula based on SCr of 1.1 mg/dL).   Medical History: Past Medical History:  Diagnosis Date  . Anxiety   . Arthritis    "all over"   . CAD (coronary artery disease)   . Charcot's joint    "left foot"  . 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 Depression   . Gastroparesis   . GERD (gastroesophageal reflux disease)   . H/O hiatal hernia   . Hyperlipidemia   . Hypertension   . Myocardial infarction (HCC) 2017/03/27   around this date  . OSA (obstructive sleep apnea)    "not bad enough for a mask"  . Peripheral neuropathy   . Peripheral vascular disease (HCC)   . PONV (postoperative nausea and vomiting)   . Pulmonary embolism (HCC)    hx. of 2012  . Shortness of breath    exertion  . Type II diabetes mellitus (HCC)    Assessment: 23 YOM with hx of PE on apixaban PTA, will be NPO and pharmacy consulted to transition to heparin.  Last dose of apxiban ~24-48h ago per pt report d/t N/V.  CBC wnl.   Initial heparin level 0.24 units/ml  APTT 46 sec  Heparin level 0.5 this AM but confirmatory heparin  level 0.73. Will decrease rate.  Goal of Therapy:  Heparin level 0.3-0.7 Monitor platelets by anticoagulation protocol: Yes   Plan:  Decrease heparin gtt to 1700 units/hr 6 hour HL Daily Heparin level.   Julious Langlois A. Jeanella Craze, PharmD, BCPS Clinical Pharmacist [place] Please utilize Amion for appropriate phone number to reach the unit pharmacist [facility])

## 2018-10-02 NOTE — Progress Notes (Signed)
ANTICOAGULATION CONSULT NOTE   Pharmacy Consult for heparin Indication: Hx PE  Allergies  Allergen Reactions  . Nabumetone Itching, Nausea Only and Rash  . Reglan [Metoclopramide] Hives and Nausea Only  . Codeine Other (See Comments)    GI UPSET - hives    Patient Measurements: Height: 6\' 3"  (190.5 cm) Weight: 261 lb 7.5 oz (118.6 kg) IBW/kg (Calculated) : 84.5 Heparin Dosing Weight: 110kg  Vital Signs: Temp: 98.4 F (36.9 C) (04/15 2018) Temp Source: Oral (04/15 2018)  Labs: Recent Labs    10/01/18 1205 10/01/18 2237 10/02/18 0228 10/02/18 0513  HGB 15.6  --   --  13.3  HCT 50.3  --   --  38.1*  PLT 221  --   --  194  APTT  --  46*  --   --   HEPARINUNFRC  --  0.24*  --  0.50  CREATININE 1.53* 1.24 1.02 0.98    Estimated Creatinine Clearance: 104.3 mL/min (by C-G formula based on SCr of 0.98 mg/dL).   Medical History: Past Medical History:  Diagnosis Date  . Anxiety   . Arthritis    "all over"   . CAD (coronary artery disease)   . Charcot's joint    "left foot"  . 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 Depression   . Gastroparesis   . GERD (gastroesophageal reflux disease)   . H/O hiatal hernia   . Hyperlipidemia   . Hypertension   . Myocardial infarction (HCC) 2017/03/27   around this date  . OSA (obstructive sleep apnea)    "not bad enough for a mask"  . Peripheral neuropathy   . Peripheral vascular disease (HCC)   . PONV (postoperative nausea and vomiting)   . Pulmonary embolism (HCC)    hx. of 2012  . Shortness of breath    exertion  . Type II diabetes mellitus (HCC)    Assessment: 10 YOM with hx of PE on apixaban PTA, will be NPO and pharmacy consulted to transition to heparin.  Last dose of apxiban ~24-48h ago per pt report d/t N/V.  CBC wnl.   Initial heparin level 0.24 units/ml  APTT 46 sec  Heparin level 0.5 this AM  Goal of Therapy:  Heparin level 0.3-0.7 Monitor platelets by anticoagulation protocol: Yes   Plan:  Continue heparin gtt at 1800 units/hr 6 hour confirmatory HL  Michale Emmerich A. Jeanella Craze, PharmD, BCPS Clinical Pharmacist [place] Please utilize Amion for appropriate phone number to reach the unit pharmacist [facility])

## 2018-10-02 NOTE — Plan of Care (Signed)

## 2018-10-02 NOTE — Progress Notes (Signed)
[facility] PROGRESS NOTE  Samuel [name] LTJ:[id number] DOB: 08-Apr-1951 DOA: 10/01/2018  PCP: [name], MD  Brief History/Interval Summary: Samuel [name] is a 68 y.o. male with medical history significant of uncontrolled diabetes insulin-dependent with recurrent DKA, history of gastroparesis, coronary artery disease, depression with anxiety, peripheral vascular disease status post left BKA, pulmonary embolism on chronic anticoagulation, GERD, and obstructive sleep apnea who presented to the ER with intractable nausea with vomiting as well as dehydration.  Patient has not been able to take his insulin due to the nausea vomiting.  Has some abdominal pain.  Symptoms are consistent with his known gastroparesis.  Denied any fever or chills denied any contact with anybody who is sick.  Patient was found to be in diabetic ketoacidosis with evidence of acute kidney injury and dehydration.  He has been admitted to the hospital for further work-up..  ED Course: Temperature 98.3 blood pressure 161/77 pulse 116, respiratory rate 29 oxygen sat 95% room air.  He has a white count 11.3 otherwise CBC within normal.  Sodium is 136 potassium 5.1 chloride 95 CO2 11 with creatinine 1.53 BUN   17.  Chest x-ray showed no active disease.  Glucose of 530.  Patient diagnosed with diabetic ketoacidosis and is being admitted to the hospital for treatment.  Reason for Visit: Diabetic ketoacidosis  Consultants: None  Procedures: None  Antibiotics: None  Subjective/Interval History: Patient states that he continues to have some nausea.  No vomiting since last night.  Denies any abdominal pain.  ROS: Denies any headaches  Objective:  Vital Signs  Vitals:   10/01/18 1758 10/01/18 1800 10/01/18 2018 10/02/18 0820  BP: (!) 153/90 (!) 153/90    Pulse:  (!) 115  77  Resp:  (!) 7  12  Temp: 98.9 F (37.2 C)  98.4 F (36.9 C) 97.8 F (36.6 C)  TempSrc: Oral  Oral Oral  SpO2:  98%  96%  Weight:        Height:        Intake/Output Summary (Last 24 hours) at 10/02/2018 1213 Last data filed at 10/02/2018 0762 Gross per 24 hour  Intake 1658.63 ml  Output --  Net 1658.63 ml   Filed Weights   10/01/18 1127  Weight: 118.6 kg    General appearance: alert, cooperative, appears stated age and no distress Head: Normocephalic, without obvious abnormality, atraumatic Resp: clear to auscultation bilaterally Cardio: regular rate and rhythm, S1, S2 normal, no murmur, click, rub or gallop GI: soft, non-tender; bowel sounds normal; no masses,  no organomegaly Extremities: Left BKA. Pulses: 2+ and symmetric Neurologic: No focal deficits.  Lab Results:  Data Reviewed: I have personally reviewed following labs and imaging studies  CBC: Recent Labs  Lab 10/01/18 1205 10/02/18 0513  WBC 11.3* 13.4*  NEUTROABS  --  11.1*  HGB 15.6 13.3  HCT 50.3 38.1*  MCV 97.5 90.9  PLT 221 194    Basic Metabolic Panel: Recent Labs  Lab 10/01/18 1205 10/01/18 2237 10/02/18 0228 10/02/18 0513 10/02/18 1130  NA 136 143 143 141 141  K 5.1 3.3* 3.2* 3.4* 3.6  CL 95* 109 109 110 110  CO2 11* 23 23 21* 19*  GLUCOSE 530* 261* 145* 137* 224*  BUN 17 17 19 19 20   CREATININE 1.53* 1.24 1.02 0.98 1.10  CALCIUM 9.4 9.2 8.7* 8.6* 8.5*    GFR: Estimated Creatinine Clearance: 92.9 mL/min (by C-G formula based on SCr of 1.1 mg/dL).  CBG: Recent Labs  Lab 10/02/18 0406 10/02/18 0511 10/02/18 0616 10/02/18 0715 10/02/18 0813  GLUCAP 120* 127* 147* 144* 140*      Radiology Studies: Dg Chest Port 1 View  Result Date: 10/01/2018 CLINICAL DATA:  DKA.  Nausea and vomiting for 1 day. EXAM: PORTABLE CHEST 1 VIEW COMPARISON:  08/16/2018 FINDINGS: The cardiomediastinal silhouette is within normal limits. The lungs are hypoinflated without evidence of airspace consolidation, edema, pleural effusion, pneumothorax. No acute osseous abnormality is seen. IMPRESSION: No active disease. Electronically Signed    By: Sebastian AcheAllen  Grady M.D.   On: 10/01/2018 14:09     Medications:  Scheduled:  insulin aspart  0-15 Units Subcutaneous TID WC   insulin aspart  0-5 Units Subcutaneous QHS   insulin glargine  30 Units Subcutaneous Daily   metoCLOPramide (REGLAN) injection  10 mg Intravenous Q6H   nystatin   Topical BID   Continuous:  sodium chloride 75 mL/hr (10/02/18 0916)   heparin 1,800 Units/hr (10/02/18 0835)   potassium chloride 10 mEq (10/02/18 1213)   NWG:[id number]:ondansetron (ZOFRAN) IV    Assessment/Plan:    Diabetic ketoacidosis in the setting of known insulin-dependent diabetes mellitus This was most likely brought upon by patient's inability to take insulin due to GI symptoms.  No other precipitant factor identified.  Her anion gap is closed.  He has been transitioned to subcutaneous insulin.  Potassium has improved.  Continue to monitor CBGs.  HbA1c was 8.7 in February.  Diabetic gastroparesis Patient has intractable nausea vomiting due to gastroparesis.  Symptoms have improved.  We will place him on scheduled metoclopramide for 24 hours.  IV fluids.  History of pulmonary embolism Patient is on apixaban at home.  He is currently on heparin infusion.  Transition back to apixaban once he is able to tolerate oral intake.  History of COPD Appears to be at baseline.  Continue to monitor.  History of obstructive sleep apnea CPAP  Mild acute renal failure and hypokalemia These have been corrected.  GERD Continue PPI  DVT Prophylaxis: Currently on IV heparin    Code Status: DNR Family Communication: Discussed with the patient Disposition Plan: Management as outlined above    LOS: 1 day   Samuel [name] [employer]ockerKrishnan  [facility] Pager on [email or web address]  10/02/2018, 12:13 PM

## 2018-10-03 DIAGNOSIS — E876 Hypokalemia: Secondary | ICD-10-CM

## 2018-10-03 LAB — CBC
HCT: 36.4 % — ABNORMAL LOW (ref 39.0–52.0)
Hemoglobin: 12.4 g/dL — ABNORMAL LOW (ref 13.0–17.0)
MCH: 31 pg (ref 26.0–34.0)
MCHC: 34.1 g/dL (ref 30.0–36.0)
MCV: 91 fL (ref 80.0–100.0)
Platelets: 148 10*3/uL — ABNORMAL LOW (ref 150–400)
RBC: 4 MIL/uL — ABNORMAL LOW (ref 4.22–5.81)
RDW: 12.9 % (ref 11.5–15.5)
WBC: 9.2 10*3/uL (ref 4.0–10.5)
nRBC: 0 % (ref 0.0–0.2)

## 2018-10-03 LAB — BASIC METABOLIC PANEL
Anion gap: 9 (ref 5–15)
BUN: 12 mg/dL (ref 8–23)
CO2: 22 mmol/L (ref 22–32)
Calcium: 8.2 mg/dL — ABNORMAL LOW (ref 8.9–10.3)
Chloride: 106 mmol/L (ref 98–111)
Creatinine, Ser: 0.92 mg/dL (ref 0.61–1.24)
GFR calc Af Amer: >60 mL/min (ref >60)
GFR calc non Af Amer: >60 mL/min (ref >60)
Glucose, Bld: 222 mg/dL — ABNORMAL HIGH (ref 70–99)
Potassium: 3.4 mmol/L — ABNORMAL LOW (ref 3.5–5.1)
Sodium: 137 mmol/L (ref 135–145)

## 2018-10-03 LAB — GLUCOSE, CAPILLARY
Glucose-Capillary: 196 mg/dL — ABNORMAL HIGH (ref 70–99)
Glucose-Capillary: 199 mg/dL — ABNORMAL HIGH (ref 70–99)
Glucose-Capillary: 181 mg/dL — ABNORMAL HIGH (ref 70–99)
Glucose-Capillary: 255 mg/dL — ABNORMAL HIGH (ref 70–99)

## 2018-10-03 LAB — MAGNESIUM: Magnesium: 1.7 mg/dL (ref 1.7–2.4)

## 2018-10-03 LAB — HEPARIN LEVEL (UNFRACTIONATED): Heparin Unfractionated: 0.56 IU/mL (ref 0.30–0.70)

## 2018-10-03 MED ORDER — MAGNESIUM SULFATE 2 GM/50ML IV SOLN
2.0000 g | Freq: Once | INTRAVENOUS | Status: AC
  Administered 2018-10-03: 13:00:00 2 g via INTRAVENOUS
  Filled 2018-10-03: qty 50

## 2018-10-03 MED ORDER — POTASSIUM CHLORIDE CRYS ER 20 MEQ PO TBCR
40.0000 meq | EXTENDED_RELEASE_TABLET | Freq: Once | ORAL | Status: AC
  Administered 2018-10-03: 40 meq via ORAL
  Filled 2018-10-03: qty 2

## 2018-10-03 MED ORDER — APIXABAN 5 MG PO TABS
5.0000 mg | ORAL_TABLET | Freq: Two times a day (BID) | ORAL | Status: DC
  Administered 2018-10-03 – 2018-10-04 (×3): 5 mg via ORAL
  Filled 2018-10-03 (×3): qty 1

## 2018-10-03 MED ORDER — ACETAMINOPHEN 325 MG PO TABS
650.0000 mg | ORAL_TABLET | Freq: Four times a day (QID) | ORAL | Status: DC | PRN
  Administered 2018-10-03: 650 mg via ORAL
  Filled 2018-10-03: qty 2

## 2018-10-03 NOTE — Progress Notes (Signed)
[facility] PROGRESS NOTE  ZACHERIA TOOKE YTK:[id number] DOB: April 05, 1951 DOA: 10/01/2018  PCP: [name], MD  Brief History/Interval Summary: Samuel [name] is a 68 y.o. male with medical history significant of uncontrolled diabetes insulin-dependent with recurrent DKA, history of gastroparesis, coronary artery disease, depression with anxiety, peripheral vascular disease status post left BKA, pulmonary embolism on chronic anticoagulation, GERD, and obstructive sleep apnea who presented to the ER with intractable nausea with vomiting as well as dehydration.  Patient has not been able to take his insulin due to the nausea vomiting.  Has some abdominal pain.  Symptoms are consistent with his known gastroparesis.  Denied any fever or chills denied any contact with anybody who is sick.  Patient was found to be in diabetic ketoacidosis with evidence of acute kidney injury and dehydration.  He has been admitted to the hospital for further work-up..  ED Course: Temperature 98.3 blood pressure 161/77 pulse 116, respiratory rate 29 oxygen sat 95% room air.  He has a white count 11.3 otherwise CBC within normal.  Sodium is 136 potassium 5.1 chloride 95 CO2 11 with creatinine 1.53 BUN   17.  Chest x-ray showed no active disease.  Glucose of 530.  Patient diagnosed with diabetic ketoacidosis and is being admitted to the hospital for treatment.  Reason for Visit: Diabetic ketoacidosis  Consultants: None  Procedures: None  Antibiotics: None  Subjective/Interval History: Patient states that he is feeling better however continues to be nauseated.  Did tolerate his clear liquid diet yesterday.  Continues to feel fatigued.  Denies any vomiting this morning.  Last episode was yesterday.  ROS: Denies any headaches.  Objective:  Vital Signs  Vitals:   10/02/18 2313 10/03/18 0347 10/03/18 0611 10/03/18 1000  BP:    (!) 156/81  Pulse:    70  Resp:    16  Temp: 98.2 F (36.8 C) 98 F (36.7 C)   98.1 F (36.7 C)  TempSrc:    Axillary  SpO2:    99%  Weight:   114.1 kg   Height:        Intake/Output Summary (Last 24 hours) at 10/03/2018 1136 Last data filed at 10/03/2018 1042 Gross per 24 hour  Intake 1630 ml  Output 1050 ml  Net 580 ml   Filed Weights   10/01/18 1127 10/03/18 0611  Weight: 118.6 kg 114.1 kg   General appearance: Awake alert.  In no distress Resp: Clear to auscultation bilaterally.  Normal effort Cardio: S1-S2 is normal regular.  No S3-S4.  No rubs murmurs or bruit GI: Abdomen is soft.  Nontender nondistended.  Bowel sounds are present normal.  No masses organomegaly Extremities: Left BKA Neurologic: Alert and oriented x3.  No focal neurological deficits.    Lab Results:  Data Reviewed: I have personally reviewed following labs and imaging studies  CBC: Recent Labs  Lab 10/01/18 1205 10/02/18 0513 10/03/18 0322  WBC 11.3* 13.4* 9.2  NEUTROABS  --  11.1*  --   HGB 15.6 13.3 12.4*  HCT 50.3 38.1* 36.4*  MCV 97.5 90.9 91.0  PLT 221 194 148*    Basic Metabolic Panel: Recent Labs  Lab 10/01/18 2237 10/02/18 0228 10/02/18 0513 10/02/18 1130 10/03/18 0322  NA 143 143 141 141 137  K 3.3* 3.2* 3.4* 3.6 3.4*  CL 109 109 110 110 106  CO2 23 23 21* 19* 22  GLUCOSE 261* 145* 137* 224* 222*  BUN 17 19 19 20 12   CREATININE 1.24 1.02  0.98 1.10 0.92  CALCIUM 9.2 8.7* 8.6* 8.5* 8.2*  MG  --   --   --   --  1.7    GFR: Estimated Creatinine Clearance: 109 mL/min (by C-G formula based on SCr of 0.92 mg/dL).  CBG: Recent Labs  Lab 10/02/18 0813 10/02/18 1225 10/02/18 1709 10/02/18 2134 10/03/18 0754  GLUCAP 140* 256* 194* 186* 196*      Radiology Studies: Dg Chest Port 1 View  Result Date: 10/01/2018 CLINICAL DATA:  DKA.  Nausea and vomiting for 1 day. EXAM: PORTABLE CHEST 1 VIEW COMPARISON:  08/16/2018 FINDINGS: The cardiomediastinal silhouette is within normal limits. The lungs are hypoinflated without evidence of airspace  consolidation, edema, pleural effusion, pneumothorax. No acute osseous abnormality is seen. IMPRESSION: No active disease. Electronically Signed   By: Sebastian AcheAllen  Grady M.D.   On: 10/01/2018 14:09     Medications:  Scheduled: . apixaban  5 mg Oral BID  . insulin aspart  0-15 Units Subcutaneous TID WC  . insulin aspart  0-5 Units Subcutaneous QHS  . insulin glargine  30 Units Subcutaneous Daily  . metoCLOPramide (REGLAN) injection  10 mg Intravenous Q6H  . nystatin   Topical BID  . pantoprazole  40 mg Oral Daily   Continuous: . sodium chloride 75 mL/hr at 10/03/18 0648   UJW:JXBJYNWGNFAPRN:ondansetron (ZOFRAN) IV    Assessment/Plan:    Diabetic ketoacidosis in the setting of known insulin-dependent diabetes mellitus This was most likely brought upon by patient's inability to take insulin due to GI symptoms.  No other precipitant factor identified.  Patient was transitioned to subcutaneous insulin yesterday.  Labs are stable.  Continue to monitor electrolytes.  HbA1c was 8.7 in February.   Diabetic gastroparesis Patient was admitted with intractable nausea and vomiting.  Patient placed on scheduled metoclopramide.  Vomiting appears to be improving.  Continues to have nausea.  Continue with IV metoclopramide for another 24 hours.  Advance to full liquid diet.  Cut back IV fluids.  Out of bed to chair.  History of pulmonary embolism Patient is on apixaban at home.  He was placed on heparin infusion at admission.  Okay to transition him back to apixaban today.   History of COPD Appears to be at baseline.  Continue to monitor.  History of obstructive sleep apnea CPAP  Mild acute renal failure and hypokalemia Renal function is stable/improved.  Replace potassium.  Magnesium is 1.7.  He will be given a dose of magnesium sulfate.    GERD Continue PPI  DVT Prophylaxis: Changed to apixaban today. Code Status: DNR Family Communication: Discussed with the patient Disposition Plan: Out of bed to  chair.  PT evaluation.  Advance to full liquid diet today.    LOS: 2 days   Lynne Takemoto Rito EhrlichKrishnan  [facility] Pager on [email or web address]  10/03/2018, 11:36 AM

## 2018-10-03 NOTE — Evaluation (Signed)
Physical Therapy Evaluation Patient Details Name: Samuel [name] MRN: [id number] DOB: 01-27-1951 Today's Date: 10/03/2018   History of Present Illness  Pt is a 68 y/o male admitted secondary to worsening n/v. Found to have DKA and gastroparesis. PMH includes OSA, PE, s/p L BKA, COPD, CAD, and DM.   Clinical Impression  Pt admitted secondary to problem above with deficits below. Pt with increased weakness and required mod a +2 to perform lateral scoot to chair this session. Pt reports increased difficulty, however, when discussing SNF recommendations, pt reports "when I get home, it will be easier." Will likely refuse SNF. If refuses, will require 24/7 support and max HH services. Will continue to follow acutely to maximize functional mobility independence and safety.     Follow Up Recommendations SNF;Supervision/Assistance - 24 hour(pt likely to refuse, will need max HH services)    Equipment Recommendations  None recommended by PT    Recommendations for Other Services OT consult     Precautions / Restrictions Precautions Precautions: Fall Restrictions Weight Bearing Restrictions: No      Mobility  Bed Mobility Overal bed mobility: Needs Assistance Bed Mobility: Supine to Sit     Supine to sit: Supervision     General bed mobility comments: Supervision for safety. Increased time required.   Transfers Overall transfer level: Needs assistance Equipment used: None Transfers: Lateral/Scoot Transfers          Lateral/Scoot Transfers: Mod assist;+2 physical assistance General transfer comment: Mod +2 to perform lateral scoot transfer to chair from bed. Used drop arm recliner. Pt unable to use LUE to push secondary to pain from IV site. Pt reports needing increased assist from baseline.   Ambulation/Gait                Stairs            Wheelchair Mobility    Modified Rankin (Stroke Patients Only)       Balance Overall balance assessment: Needs  assistance Sitting-balance support: No upper extremity supported;Feet supported Sitting balance-Leahy Scale: Fair                                       Pertinent Vitals/Pain Pain Assessment: No/denies pain    Home Living Family/patient expects to be discharged to:: Private residence Living Arrangements: Other relatives Available Help at Discharge: Family;Available PRN/intermittently Type of Home: Apartment Home Access: Level entry     Home Layout: One level Home Equipment: Walker - 2 wheels;Wheelchair - power;Wheelchair - manual;Tub bench;Hand held shower head;Grab bars - tub/shower;Grab bars - toilet Additional Comments: Pt reports he has a family member that is his caregiver and can provide 24/7 assist.     Prior Function Level of Independence: Needs assistance   Gait / Transfers Assistance Needed: Only uses RW to ambulate when he dons prothesis, reports he does not wear often and typically uses manual w/c. Indep with pivot transfers to/from w/c, BSC over toilet and shower bench that has rotating seat  ADL's / Homemaking Assistance Needed: Cousin lives next door and acts as caregiver. Always present when pt bathes to assist with washing/drying backside. Pt mod indep with other ADLs        Hand Dominance        Extremity/Trunk Assessment   Upper Extremity Assessment Upper Extremity Assessment: Defer to OT evaluation    Lower Extremity Assessment Lower Extremity Assessment: LLE deficits/detail LLE Deficits /  Details: L BKA at baseline     Cervical / Trunk Assessment Cervical / Trunk Assessment: Normal  Communication   Communication: No difficulties  Cognition Arousal/Alertness: Awake/alert Behavior During Therapy: WFL for tasks assessed/performed Overall Cognitive Status: Within Functional Limits for tasks assessed                                        General Comments      Exercises     Assessment/Plan    PT Assessment  Patient needs continued PT services  PT Problem List Decreased strength;Decreased balance;Decreased mobility;Decreased knowledge of use of DME;Decreased knowledge of precautions;Decreased activity tolerance       PT Treatment Interventions DME instruction;Functional mobility training;Therapeutic activities;Therapeutic exercise;Balance training;Patient/family education    PT Goals (Current goals can be found in the Care Plan section)  Acute Rehab PT Goals Patient Stated Goal: to go home tomorrow PT Goal Formulation: With patient Time For Goal Achievement: 10/17/18 Potential to Achieve Goals: Fair    Frequency Min 3X/week   Barriers to discharge        Co-evaluation               AM-PAC PT "6 Clicks" Mobility  Outcome Measure Help needed turning from your back to your side while in a flat bed without using bedrails?: None Help needed moving from lying on your back to sitting on the side of a flat bed without using bedrails?: A Little Help needed moving to and from a bed to a chair (including a wheelchair)?: A Lot Help needed standing up from a chair using your arms (e.g., wheelchair or bedside chair)?: Total Help needed to walk in hospital room?: Total Help needed climbing 3-5 steps with a railing? : Total 6 Click Score: 12    End of Session   Activity Tolerance: Patient limited by fatigue Patient left: in chair;with call bell/phone within reach;with chair alarm set Nurse Communication: Mobility status PT Visit Diagnosis: Unsteadiness on feet (R26.81);Difficulty in walking, not elsewhere classified (R26.2)    Time: [id number] PT Time Calculation (min) (ACUTE ONLY): 21 min   Charges:   PT Evaluation $PT Eval Moderate Complexity: 1 Mod          Gladys DammeBrittany Panagiotis Oelkers, PT, DPT  Acute Rehabilitation Services  Pager: [phone] Office: [phone]   Lehman PromBrittany S Rafferty Postlewait 10/03/2018, 3:48 PM

## 2018-10-03 NOTE — Progress Notes (Addendum)
Addendum: Consulted to change back to Apixaban.   Plan:  Restart Apixaban 5mg  po BID.  Stop IV Heparin infusion at the same time 1st dose of Apixaban is given.   Will cancel confirmatory heparin level.   Link Snuffer, PharmD, BCPS, BCCCP Clinical Pharmacist Please refer to [facility] for [facility] numbers 10/03/2018., 9:13 AM     ANTICOAGULATION CONSULT NOTE - Follow-Up  Pharmacy Consult for heparin Indication: Hx PE (Apixaban on hold)  Allergies  Allergen Reactions  . Nabumetone Itching, Nausea Only and Rash  . Reglan [Metoclopramide] Hives and Nausea Only  . Codeine Other (See Comments)    GI UPSET - hives    Patient Measurements: Height: 6\' 3"  (190.5 cm) Weight: 251 lb 9.6 oz (114.1 kg) IBW/kg (Calculated) : 84.5 Heparin Dosing Weight: 110kg  Vital Signs: Temp: 98 F (36.7 C) (04/17 0347)  Labs: Recent Labs    10/01/18 1205  10/01/18 2237  10/02/18 0513 10/02/18 1130 10/02/18 1828 10/03/18 0322  HGB 15.6  --   --   --  13.3  --   --  12.4*  HCT 50.3  --   --   --  38.1*  --   --  36.4*  PLT 221  --   --   --  194  --   --  148*  APTT  --   --  46*  --   --   --   --   --   HEPARINUNFRC  --    < > 0.24*  --  0.50 0.73* 0.73* 0.56  CREATININE 1.53*  --  1.24   < > 0.98 1.10  --  0.92   < > = values in this interval not displayed.    Estimated Creatinine Clearance: 109 mL/min (by C-G formula based on SCr of 0.92 mg/dL).   Medical History: Past Medical History:  Diagnosis Date  . Anxiety   . Arthritis    "all over"   . CAD (coronary artery disease)   . Charcot's joint    "left foot"  . 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 Depression   . Gastroparesis   . GERD (gastroesophageal reflux disease)   . H/O hiatal hernia   . Hyperlipidemia   . Hypertension   . Myocardial infarction (HCC) 2017/03/27   around this date  . OSA (obstructive sleep apnea)    "not bad enough for a mask"  . Peripheral neuropathy   . Peripheral  vascular disease (HCC)   . PONV (postoperative nausea and vomiting)   . Pulmonary embolism (HCC)    hx. of 2012  . Shortness of breath    exertion  . Type II diabetes mellitus [facility])    Assessment: 68 yo M with hx of PE on apixaban PTA, who is NPO due to prolonged N/V thought 2/2 DM gastroparesis.  Pharmacy was consulted to transition to heparin.  Last dose of apxiban ~24-48h prior to admission per pt report.  CBC wnl.    Heparin level is therapeutic this AM at 0.56 after decreasing rate to 1600 units/hr.  No bleeding noted. H/H and Platelets are trending down. This may be dilutional but will watch platelet trend.  Patient currently on clear liquid diet.   Goal of Therapy:  Heparin level 0.3-0.7 Monitor platelets by anticoagulation protocol: Yes   Plan:  Continue heparin at 1600 units/hr Confirm with heparin level in 6 hours Daily Heparin level and CBC while on therapy.  Monitor ability  to take oral and restart Apixaban.   Link SnufferJessica Allesandra Huebsch, PharmD, BCPS, BCCCP Clinical Pharmacist  **Pharmacist phone directory can now be found on [email or web address] ([email or web address]).  Listed under [facility]. 10/03/2018 8:14 AM

## 2018-10-04 LAB — BASIC METABOLIC PANEL
Anion gap: 11 (ref 5–15)
BUN: 7 mg/dL — ABNORMAL LOW (ref 8–23)
CO2: 20 mmol/L — ABNORMAL LOW (ref 22–32)
Calcium: 8.3 mg/dL — ABNORMAL LOW (ref 8.9–10.3)
Chloride: 106 mmol/L (ref 98–111)
Creatinine, Ser: 1.01 mg/dL (ref 0.61–1.24)
GFR calc Af Amer: >60 mL/min (ref >60)
GFR calc non Af Amer: >60 mL/min (ref >60)
Glucose, Bld: 182 mg/dL — ABNORMAL HIGH (ref 70–99)
Potassium: 4 mmol/L (ref 3.5–5.1)
Sodium: 137 mmol/L (ref 135–145)

## 2018-10-04 LAB — MAGNESIUM: Magnesium: 1.8 mg/dL (ref 1.7–2.4)

## 2018-10-04 LAB — GLUCOSE, CAPILLARY: Glucose-Capillary: 184 mg/dL — ABNORMAL HIGH (ref 70–99)

## 2018-10-04 NOTE — Care Management (Addendum)
Spoke with patient at bedside, he states [facility] wants [facility] as he has had them in the past. We reviewed HH ratings from [facility] website. He states he has all DME he needs: RW WC prosthetic toilet riser tub seat.  Referral placed to [facility], anticipate DC today patient states he has cab to take him home.    11:20 Notified by [facility] that they are unable to take him, requested [facility] to consider and they are able to accept.

## 2018-10-04 NOTE — Discharge Summary (Signed)
DEANDREW HOECKER ZOX:[id number] DOB: 1951/06/12 DOA: 10/01/2018  PCP: [name], MD  Admit date: 10/01/2018  Discharge date: 10/04/2018  Admitted From: Home   Disposition:  Home   Recommendations for Outpatient Follow-up:   Follow up with PCP in 1-2 weeks  PCP Please obtain BMP/CBC, 2 view CXR in 1week,  (see Discharge instructions)   PCP Please follow up on the following pending results:    Home Health: PT,RN   Equipment/Devices: None  Consultations: None Discharge Condition: Stable   CODE STATUS: Full   Diet Recommendation: Heart Healthy Low Carb   CC - Nausea   Brief history of present illness from the day of admission and additional interim summary    Shulem Mader Hinsonis a 68 y.o.malewith medical history significant ofuncontrolled diabetes insulin-dependent with recurrent DKA, history of gastroparesis, coronary artery disease, depression with anxiety, peripheral vascular disease status post left BKA, pulmonary embolism on chronic anticoagulation, GERD, and obstructive sleep apnea who presented to the ER withintractable nausea with vomiting as well as dehydration. Patient has not been able to take his insulin due to the nausea vomiting. Has some abdominal pain. Symptoms are consistent with his known gastroparesis. Denied any fever or chills denied any contact with anybody who is sick. Patient was found to be in diabetic ketoacidosis with evidence of acute kidney injury and dehydration. He has been admitted to the hospital for further work-up.                                                                 Hospital Course     Diabetic ketoacidosis in the setting of known insulin-dependent diabetes mellitus This was most likely brought upon by patient's inability to take insulin due to GI symptoms.  No  other precipitant factor identified.  Patient was transitioned to subcutaneous insulin yesterday.  Ischemic control stable, he is symptom-free, will be discharged home on home regimen.  Follow with PCP for glycemic control.  Also home PT and RN ordered.  Diabetic gastroparesis Patient was admitted with intractable nausea and vomiting.    Port of care back to baseline tolerating diet.  History of pulmonary embolism Patient is on apixaban at home.  He was placed on heparin infusion at admission.  Okay to transition him back to apixaban today.   History of COPD Appears to be at baseline.  Continue to monitor.  History of obstructive sleep apnea CPAP  Mild acute renal failure and hypokalemia Renal function is stable/improved.  Replace potassium.  Magnesium is 1.7.  He will be given a dose of magnesium sulfate.    GERD Continue PPI   Discharge diagnosis     Principal Problem:   DKA (diabetic ketoacidoses) (HCC) Active Problems:   OBSTRUCTIVE SLEEP APNEA   GERD   Pulmonary embolism (HCC)  Essential hypertension   Nausea & vomiting   S/P BKA (below knee amputation) (HCC)   COPD (chronic obstructive pulmonary disease) (HCC)   Gastroparesis    Discharge instructions    Discharge Instructions    Discharge instructions   Complete by:  As directed    Follow with Primary MD [name], MD in 7 days   Get CBC, CMP, 2 view Chest X ray -  checked  by Primary MD  in 5-7 days   Activity: As tolerated with Full fall precautions use walker/cane & assistance as needed  Disposition Home Low Carb  Accuchecks 4 times/day, Once in AM empty stomach and then before each meal. Log in all results and show them to your Prim.MD in 3 days. If any glucose reading is under 80 or above 300 call your Prim MD immidiately. Follow Low glucose instructions for glucose under 80 as instructed.  Special Instructions: If you have smoked or chewed Tobacco  in the last 2 yrs please stop smoking,  stop any regular Alcohol  and or any Recreational drug use.  On your next visit with your primary care physician please Get Medicines reviewed and adjusted.  Please request your Prim.MD to go over all Hospital Tests and Procedure/Radiological results at the follow up, please get all Hospital records sent to your Prim MD by signing hospital release before you go home.  If you experience worsening of your admission symptoms, develop shortness of breath, life threatening emergency, suicidal or homicidal thoughts you must seek medical attention immediately by calling 911 or calling your MD immediately  if symptoms less severe.  You Must read complete instructions/literature along with all the possible adverse reactions/side effects for all the Medicines you take and that have been prescribed to you. Take any new Medicines after you have completely understood and accpet all the possible adverse reactions/side effects.   Do not drive, operate heavy machinery, perform activities at heights, swimming or participation in water activities or provide baby sitting services if your were admitted for syncope or siezures until you have seen by Primary MD or a Neurologist and advised to do so again.   Increase activity slowly   Complete by:  As directed       Discharge Medications   Allergies as of 10/04/2018      Reactions   Relafen [nabumetone] Nausea And Vomiting, Rash   Codeine Nausea And Vomiting, Rash      Medication List    TAKE these medications   acarbose 50 MG tablet Commonly known as:  PRECOSE Take 50 mg by mouth 3 (three) times daily with meals.   amLODipine 10 MG tablet Commonly known as:  NORVASC Take 1 tablet (10 mg total) by mouth daily.   apixaban 5 MG Tabs tablet Commonly known as:  ELIQUIS Take 1 tablet (5 mg total) by mouth 2 (two) times daily.   aspirin 81 MG EC tablet Take 1 tablet (81 mg total) by mouth daily.   atorvastatin 80 MG tablet Commonly known as:  LIPITOR  Take 1 tablet (80 mg total) by mouth daily at 6 PM.   Basaglar KwikPen 100 UNIT/ML Sopn Inject 40 Units into the skin daily.   clopidogrel 75 MG tablet Commonly known as:  PLAVIX Take 75 mg by mouth daily.   docusate sodium 100 MG capsule Commonly known as:  COLACE Take 100 mg by mouth daily.   ferrous sulfate 325 (65 FE) MG tablet Take 325 mg by mouth every evening.  furosemide 20 MG tablet Commonly known as:  LASIX Take 1 tablet (20 mg total) by mouth daily. What changed:  how much to take   gabapentin 300 MG capsule Commonly known as:  NEURONTIN Take 300 mg by mouth 3 (three) times daily.   insulin lispro 100 UNIT/ML injection Commonly known as:  HUMALOG Inject 5-10 Units into the skin See admin instructions. Per sliding scale 5-10 units three times daily before meals   lansoprazole 30 MG capsule Commonly known as:  PREVACID Take 30 mg by mouth daily at 12 noon.   metFORMIN 500 MG 24 hr tablet Commonly known as:  GLUCOPHAGE-XR Take 500 mg by mouth 2 (two) times daily.   metoCLOPramide 10 MG tablet Commonly known as:  Reglan Take 1 tablet (10 mg total) by mouth every 6 (six) hours for 30 days. Taper back down to your home dose as your symptoms begin to improve. What changed:    how much to take  when to take this  additional instructions   metoprolol tartrate 25 MG tablet Commonly known as:  LOPRESSOR Take 1 tablet (25 mg total) by mouth 2 (two) times daily.   nitroGLYCERIN 0.4 MG SL tablet Commonly known as:  NITROSTAT Place 1 tablet (0.4 mg total) under the tongue every 5 (five) minutes as needed for chest pain.   OneTouch Verio test strip Generic drug:  glucose blood Use to test blood sugar twice daily   pantoprazole 40 MG tablet Commonly known as:  PROTONIX Take 1 tablet (40 mg total) by mouth 2 (two) times daily before a meal.   prednisoLONE acetate 1 % ophthalmic suspension Commonly known as:  PRED FORTE Place 1 drop into the left eye 4  (four) times daily.   promethazine 25 MG suppository Commonly known as:  PHENERGAN Place 1 suppository (25 mg total) rectally every 6 (six) hours as needed for nausea or vomiting.   promethazine 25 MG/ML injection Commonly known as:  PHENERGAN Inject 25 mg into the muscle every 6 (six) hours as needed for nausea or vomiting.   promethazine 25 MG tablet Commonly known as:  PHENERGAN Take 25-50 mg by mouth every 6 (six) hours as needed for nausea or vomiting.   ranolazine 500 MG 12 hr tablet Commonly known as:  RANEXA Take 1 tablet (500 mg total) by mouth 2 (two) times daily.   traMADol 50 MG tablet Commonly known as:  ULTRAM Take 100 mg by mouth every 6 (six) hours as needed for moderate pain.       Follow-up Information    [name], MD. Schedule an appointment as soon as possible for a visit in 1 week(s).   Specialty:  Internal Medicine Contact information: [address] [place] [address] [phone]           Major procedures and Radiology Reports - PLEASE review detailed and final reports thoroughly  -       Dg Chest Port 1 View  Result Date: 10/01/2018 CLINICAL DATA:  DKA.  Nausea and vomiting for 1 day. EXAM: PORTABLE CHEST 1 VIEW COMPARISON:  08/16/2018 FINDINGS: The cardiomediastinal silhouette is within normal limits. The lungs are hypoinflated without evidence of airspace consolidation, edema, pleural effusion, pneumothorax. No acute osseous abnormality is seen. IMPRESSION: No active disease. Electronically Signed   By: Sebastian Ache M.D.   On: 10/01/2018 14:09    Micro Results     No results found for this or any previous visit (from the past 240 hour(s)).  Today   Subjective    Carolynn ServeJohn Hickel today has no headache,no chest abdominal pain,no new weakness tingling or numbness, feels much better wants to go home today.    Objective   Blood pressure (!) 156/81, pulse 70, temperature 98.6 F (37 C), temperature source Oral, resp.  rate 16, height 6\' 3"  (1.905 m), weight 114.1 kg, SpO2 99 %.   Intake/Output Summary (Last 24 hours) at 10/04/2018 0945 Last data filed at 10/04/2018 0809 Gross per 24 hour  Intake 120 ml  Output 3150 ml  Net -3030 ml    Exam  Awake Alert, Oriented x 3, No new F.N deficits, Normal affect [place].AT,PERRAL Supple Neck,No JVD, No cervical lymphadenopathy appriciated.  Symmetrical Chest wall movement, Good air movement bilaterally, CTAB RRR,No Gallops,Rubs or new Murmurs, No Parasternal Heave +ve B.Sounds, Abd Soft, Non tender, No organomegaly appriciated, No rebound -guarding or rigidity. No Cyanosis, Clubbing or edema, left old BKA   Data Review   CBC w Diff:  Lab Results  Component Value Date   WBC 9.2 10/03/2018   HGB 12.4 (L) 10/03/2018   HCT 36.4 (L) 10/03/2018   PLT 148 (L) 10/03/2018   LYMPHOPCT 9 10/02/2018   MONOPCT 8 10/02/2018   EOSPCT 0 10/02/2018   BASOPCT 0 10/02/2018    CMP:  Lab Results  Component Value Date   NA 137 10/04/2018   NA 140 03/21/2015   K 4.0 10/04/2018   CL 106 10/04/2018   CO2 20 (L) 10/04/2018   BUN 7 (L) 10/04/2018   BUN 20 03/21/2015   CREATININE 1.01 10/04/2018   CREATININE 1.21 01/05/2013   GLU 80 03/21/2015   PROT 6.3 (L) 08/20/2018   ALBUMIN 2.8 (L) 08/20/2018   BILITOT 0.7 08/20/2018   ALKPHOS 55 08/20/2018   AST 19 08/20/2018   ALT 16 08/20/2018  .   Total Time in preparing paper work, data evaluation and todays exam - 35 minutes  Susa RaringPrashant Emagene Merfeld M.D on 10/04/2018 at 9:45 AM  [facility]   Office  [phone]

## 2018-10-04 NOTE — Discharge Instructions (Signed)
Follow with Primary MD [name], MD in 7 days   Get CBC, CMP, 2 view Chest X ray -  checked  by Primary MD  in 5-7 days   Activity: As tolerated with Full fall precautions use walker/cane & assistance as needed  Disposition Home Low Carb  Accuchecks 4 times/day, Once in AM empty stomach and then before each meal. Log in all results and show them to your Prim.MD in 3 days. If any glucose reading is under 80 or above 300 call your Prim MD immidiately. Follow Low glucose instructions for glucose under 80 as instructed.  Special Instructions: If you have smoked or chewed Tobacco  in the last 2 yrs please stop smoking, stop any regular Alcohol  and or any Recreational drug use.  On your next visit with your primary care physician please Get Medicines reviewed and adjusted.  Please request your Prim.MD to go over all Hospital Tests and Procedure/Radiological results at the follow up, please get all Hospital records sent to your Prim MD by signing hospital release before you go home.  If you experience worsening of your admission symptoms, develop shortness of breath, life threatening emergency, suicidal or homicidal thoughts you must seek medical attention immediately by calling 911 or calling your MD immediately  if symptoms less severe.  You Must read complete instructions/literature along with all the possible adverse reactions/side effects for all the Medicines you take and that have been prescribed to you. Take any new Medicines after you have completely understood and accpet all the possible adverse reactions/side effects.   Do not drive, operate heavy machinery, perform activities at heights, swimming or participation in water activities or provide baby sitting services if your were admitted for syncope or siezures until you have seen by Primary MD or a Neurologist and advised to do so again.

## 2018-10-04 NOTE — Plan of Care (Signed)
  Problem: Skin Integrity: Goal: Risk for impaired skin integrity will decrease 10/04/2018 1105 by Sanda Linger, RN Outcome: Adequate for Discharge 10/04/2018 1103 by Sanda Linger, RN Outcome: Adequate for Discharge

## 2018-10-04 NOTE — Plan of Care (Signed)
  Problem: Education: Goal: Knowledge of General Education information will improve Description Including pain rating scale, medication(s)/side effects and non-pharmacologic comfort measures Outcome: Completed/Met   Problem: Health Behavior/Discharge Planning: Goal: Ability to manage health-related needs will improve Outcome: Completed/Met   Problem: Clinical Measurements: Goal: Ability to maintain clinical measurements within normal limits will improve Outcome: Completed/Met Goal: Will remain free from infection Outcome: Completed/Met Goal: Diagnostic test results will improve Outcome: Adequate for Discharge Goal: Respiratory complications will improve Outcome: Completed/Met Goal: Cardiovascular complication will be avoided Outcome: Completed/Met   Problem: Activity: Goal: Risk for activity intolerance will decrease Outcome: Adequate for Discharge   Problem: Skin Integrity: Goal: Risk for impaired skin integrity will decrease Outcome: Adequate for Discharge   Problem: Safety: Goal: Ability to remain free from injury will improve Outcome: Completed/Met   Problem: Pain Managment: Goal: General experience of comfort will improve Outcome: Completed/Met   Problem: Elimination: Goal: Will not experience complications related to bowel motility Outcome: Completed/Met Goal: Will not experience complications related to urinary retention Outcome: Completed/Met

## 2018-10-04 NOTE — Progress Notes (Signed)
Pt given discharge instructions & education including but not limited to meds, f/u appointments, when to call the MD & etc. Pt's iv was removed. All belongings gathered & sent with pt. Sanda Linger, RN

## 2018-10-05 DIAGNOSIS — Z7982 Long term (current) use of aspirin: Secondary | ICD-10-CM | POA: Diagnosis not present

## 2018-10-05 DIAGNOSIS — Z89512 Acquired absence of left leg below knee: Secondary | ICD-10-CM | POA: Diagnosis not present

## 2018-10-05 DIAGNOSIS — I2510 Atherosclerotic heart disease of native coronary artery without angina pectoris: Secondary | ICD-10-CM | POA: Diagnosis not present

## 2018-10-05 DIAGNOSIS — Z86711 Personal history of pulmonary embolism: Secondary | ICD-10-CM | POA: Diagnosis not present

## 2018-10-05 DIAGNOSIS — I252 Old myocardial infarction: Secondary | ICD-10-CM | POA: Diagnosis not present

## 2018-10-05 DIAGNOSIS — K3184 Gastroparesis: Secondary | ICD-10-CM | POA: Diagnosis not present

## 2018-10-05 DIAGNOSIS — I10 Essential (primary) hypertension: Secondary | ICD-10-CM | POA: Diagnosis not present

## 2018-10-05 DIAGNOSIS — K219 Gastro-esophageal reflux disease without esophagitis: Secondary | ICD-10-CM | POA: Diagnosis not present

## 2018-10-05 DIAGNOSIS — F418 Other specified anxiety disorders: Secondary | ICD-10-CM | POA: Diagnosis not present

## 2018-10-05 DIAGNOSIS — Z95828 Presence of other vascular implants and grafts: Secondary | ICD-10-CM | POA: Diagnosis not present

## 2018-10-05 DIAGNOSIS — E785 Hyperlipidemia, unspecified: Secondary | ICD-10-CM | POA: Diagnosis not present

## 2018-10-05 DIAGNOSIS — Z7902 Long term (current) use of antithrombotics/antiplatelets: Secondary | ICD-10-CM | POA: Diagnosis not present

## 2018-10-05 DIAGNOSIS — Z794 Long term (current) use of insulin: Secondary | ICD-10-CM | POA: Diagnosis not present

## 2018-10-05 DIAGNOSIS — E1143 Type 2 diabetes mellitus with diabetic autonomic (poly)neuropathy: Secondary | ICD-10-CM | POA: Diagnosis not present

## 2018-10-05 DIAGNOSIS — Z89411 Acquired absence of right great toe: Secondary | ICD-10-CM | POA: Diagnosis not present

## 2018-10-05 DIAGNOSIS — M1990 Unspecified osteoarthritis, unspecified site: Secondary | ICD-10-CM | POA: Diagnosis not present

## 2018-10-05 DIAGNOSIS — E1151 Type 2 diabetes mellitus with diabetic peripheral angiopathy without gangrene: Secondary | ICD-10-CM | POA: Diagnosis not present

## 2018-10-05 DIAGNOSIS — J449 Chronic obstructive pulmonary disease, unspecified: Secondary | ICD-10-CM | POA: Diagnosis not present

## 2018-10-05 DIAGNOSIS — E11610 Type 2 diabetes mellitus with diabetic neuropathic arthropathy: Secondary | ICD-10-CM | POA: Diagnosis not present

## 2018-10-05 DIAGNOSIS — Z955 Presence of coronary angioplasty implant and graft: Secondary | ICD-10-CM | POA: Diagnosis not present

## 2018-10-08 ENCOUNTER — Ambulatory Visit: Payer: PPO | Admitting: Cardiology

## 2018-10-08 ENCOUNTER — Telehealth: Payer: Self-pay | Admitting: *Deleted

## 2018-10-08 NOTE — Telephone Encounter (Signed)
Left message for patient to call and schedule virtual/telephone visit with Dr. Jens Som

## 2018-10-09 NOTE — Telephone Encounter (Signed)
Left message for patient to call and schedule folow up appointment with Dr. Allyson Sabal (patient has not seen Dr. Jens Som)

## 2018-10-10 ENCOUNTER — Telehealth: Payer: Self-pay | Admitting: Pharmacist

## 2018-10-10 NOTE — Patient Outreach (Signed)
[facility]) Care Management  10/10/2018  Samuel [name] January 12, 1951 [id number]   Patient was called for post discharge medication review per referral from [facility]. Unfortunately, he did not answer the phone. HIPAA compliant message was left on his voicemail.  Plan: Send an unsuccessful Careers adviser. Call patient back in 3-5 business days.   [name], PharmD, BCACP [facility] Clinical Pharmacist [phone]

## 2018-10-15 ENCOUNTER — Ambulatory Visit: Payer: Self-pay | Admitting: Pharmacist

## 2018-10-15 ENCOUNTER — Other Ambulatory Visit: Payer: Self-pay | Admitting: Pharmacist

## 2018-10-15 LAB — HM DIABETES EYE EXAM

## 2018-10-15 NOTE — Patient Outreach (Signed)
[facility]) Care Management  10/15/2018  Samuel [name] 09/20/1950 [id number]    Patient was called regarding medication review post discharge. Unfortunately, he did not answer the phone. HIPAA compliant message was left on the patient's voicemail.  An unsuccessful outreach letter was sent to the patient 10/10/2018.   Plan: Call patient back in 10-14 business days.  [name], PharmD, BCACP [facility] Clinical Pharmacist [phone]

## 2018-10-16 ENCOUNTER — Encounter: Payer: PPO | Admitting: Cardiovascular Disease

## 2018-10-24 ENCOUNTER — Other Ambulatory Visit: Payer: Self-pay | Admitting: Pharmacist

## 2018-10-24 NOTE — Patient Outreach (Addendum)
[facility]) Care Management  10/24/2018  Samuel [name] 1950/06/21 [id number]   Patient was called regarding medication review post discharge. Unfortunately, he did not answer the phone. HIPAA compliant message was left on the patient's voicemail.  An unsuccessful outreach letter was sent to the patient 10/10/2018.  Today's call was the 3rd unsuccessful call.  Plan: Close patient's case due to inability to contact.   [name], PharmD, BCACP [facility] Clinical Pharmacist [phone]

## 2018-10-26 ENCOUNTER — Emergency Department (HOSPITAL_COMMUNITY): Payer: PPO

## 2018-10-26 ENCOUNTER — Encounter (HOSPITAL_COMMUNITY): Payer: Self-pay

## 2018-10-26 ENCOUNTER — Other Ambulatory Visit: Payer: Self-pay

## 2018-10-26 ENCOUNTER — Emergency Department (HOSPITAL_COMMUNITY)
Admission: EM | Admit: 2018-10-26 | Discharge: 2018-10-26 | Disposition: A | Discharge status: Home / Self Care | Payer: PPO | Attending: Emergency Medicine | Admitting: Emergency Medicine

## 2018-10-26 DIAGNOSIS — S0990XA Unspecified injury of head, initial encounter: Secondary | ICD-10-CM | POA: Diagnosis not present

## 2018-10-26 DIAGNOSIS — W06XXXA Fall from bed, initial encounter: Secondary | ICD-10-CM | POA: Diagnosis not present

## 2018-10-26 DIAGNOSIS — I2510 Atherosclerotic heart disease of native coronary artery without angina pectoris: Secondary | ICD-10-CM | POA: Diagnosis not present

## 2018-10-26 DIAGNOSIS — Z7901 Long term (current) use of anticoagulants: Secondary | ICD-10-CM | POA: Insufficient documentation

## 2018-10-26 DIAGNOSIS — W19XXXA Unspecified fall, initial encounter: Secondary | ICD-10-CM | POA: Diagnosis not present

## 2018-10-26 DIAGNOSIS — S81812A Laceration without foreign body, left lower leg, initial encounter: Secondary | ICD-10-CM | POA: Diagnosis not present

## 2018-10-26 DIAGNOSIS — Z7902 Long term (current) use of antithrombotics/antiplatelets: Secondary | ICD-10-CM | POA: Diagnosis not present

## 2018-10-26 DIAGNOSIS — I252 Old myocardial infarction: Secondary | ICD-10-CM | POA: Insufficient documentation

## 2018-10-26 DIAGNOSIS — Z794 Long term (current) use of insulin: Secondary | ICD-10-CM | POA: Insufficient documentation

## 2018-10-26 DIAGNOSIS — Y999 Unspecified external cause status: Secondary | ICD-10-CM | POA: Diagnosis not present

## 2018-10-26 DIAGNOSIS — Y929 Unspecified place or not applicable: Secondary | ICD-10-CM | POA: Insufficient documentation

## 2018-10-26 DIAGNOSIS — Z7982 Long term (current) use of aspirin: Secondary | ICD-10-CM | POA: Insufficient documentation

## 2018-10-26 DIAGNOSIS — E119 Type 2 diabetes mellitus without complications: Secondary | ICD-10-CM | POA: Diagnosis not present

## 2018-10-26 DIAGNOSIS — I10 Essential (primary) hypertension: Secondary | ICD-10-CM | POA: Diagnosis not present

## 2018-10-26 DIAGNOSIS — Z79899 Other long term (current) drug therapy: Secondary | ICD-10-CM | POA: Diagnosis not present

## 2018-10-26 DIAGNOSIS — S81811A Laceration without foreign body, right lower leg, initial encounter: Secondary | ICD-10-CM

## 2018-10-26 DIAGNOSIS — Y939 Activity, unspecified: Secondary | ICD-10-CM | POA: Insufficient documentation

## 2018-10-26 DIAGNOSIS — R5381 Other malaise: Secondary | ICD-10-CM | POA: Diagnosis not present

## 2018-10-26 DIAGNOSIS — R51 Headache: Secondary | ICD-10-CM | POA: Diagnosis not present

## 2018-10-26 MED ORDER — BACITRACIN-NEOMYCIN-POLYMYXIN 400-5-5000 EX OINT
TOPICAL_OINTMENT | Freq: Once | CUTANEOUS | Status: AC
  Administered 2018-10-26: 03:00:00 via TOPICAL
  Filled 2018-10-26: qty 1

## 2018-10-26 NOTE — ED Notes (Signed)
[employer] called at this time for Transport.

## 2018-10-26 NOTE — ED Triage Notes (Signed)
Pt came to the ED from home.  Pt was sitting up on the side of the bed at home and slid off of the bed onto the floor.  Pt hit the top and back of his head on the floor, but no LOC.  Pt has a laceration to the RLE.  Currently taking blood thinners

## 2018-10-26 NOTE — ED Provider Notes (Signed)
TIME SEEN: 2:38 AM  CHIEF COMPLAINT: Fall, head injury  HPI: Patient is a 68 year old male with history of hypertension, hyperlipidemia, diabetes, CAD on Plavix, PE on chronic anticoagulation with Eliquis who presents to the emergency department after he slipped and fell out of bed tonight striking the top of his head.  No loss of consciousness.  No neck or back pain.  Has a skin tear to the right shin.  States his last tetanus vaccination was in the past 5 years.  States he is here to "get an x-ray of my head" because he is on anticoagulation.  ROS: See HPI Constitutional: no fever  Eyes: no drainage  ENT: no runny nose   Cardiovascular:  no chest pain  Resp: no SOB  GI: no vomiting GU: no dysuria Integumentary: no rash  Allergy: no hives  Musculoskeletal: no leg swelling  Neurological: no slurred speech ROS otherwise negative  PAST MEDICAL HISTORY/PAST SURGICAL HISTORY:  Past Medical History:  Diagnosis Date  . Anxiety   . Arthritis    "all over"   . CAD (coronary artery disease)   . Charcot's joint    "left foot"  . 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 Depression   . Gastroparesis   . GERD (gastroesophageal reflux disease)   . H/O hiatal hernia   . Hyperlipidemia   . Hypertension   . Myocardial infarction (HCC) 2017/03/27   around this date  . OSA (obstructive sleep apnea)    "not bad enough for a mask"  . Peripheral neuropathy   . Peripheral vascular disease (HCC)   . PONV (postoperative nausea and vomiting)   . Pulmonary embolism (HCC)    hx. of 2012  . Shortness of breath    exertion  . Type II diabetes mellitus (HCC)     MEDICATIONS:  Prior to Admission medications   Medication Sig Start Date End Date Taking? Authorizing Provider  acarbose (PRECOSE) 50 MG tablet Take 50 mg by mouth 3 (three) times daily with meals.    [provider]  amLODipine (NORVASC) 10 MG tablet Take 1 tablet (10 mg total) by mouth daily. 11/22/16    Richarda OverlieAbrol, Nayana, MD  apixaban (ELIQUIS) 5 MG TABS tablet Take 1 tablet (5 mg total) by mouth 2 (two) times daily. 09/24/17   Osvaldo ShipperKrishnan, Gokul, MD  aspirin 81 MG EC tablet Take 1 tablet (81 mg total) by mouth daily. 04/12/17   Glade LloydAlekh, Kshitiz, MD  atorvastatin (LIPITOR) 80 MG tablet Take 1 tablet (80 mg total) by mouth daily at 6 PM. 04/11/17   Glade LloydAlekh, Kshitiz, MD  clopidogrel (PLAVIX) 75 MG tablet Take 75 mg by mouth daily.    [provider]  docusate sodium (COLACE) 100 MG capsule Take 100 mg by mouth daily.    [provider]  ferrous sulfate 325 (65 FE) MG tablet Take 325 mg by mouth every evening.     [provider]  furosemide (LASIX) 20 MG tablet Take 1 tablet (20 mg total) by mouth daily. Patient taking differently: Take 40 mg by mouth daily.  04/12/17   Glade LloydAlekh, Kshitiz, MD  gabapentin (NEURONTIN) 300 MG capsule Take 300 mg by mouth 3 (three) times daily.     [provider]  Insulin Glargine (BASAGLAR KWIKPEN) 100 UNIT/ML SOPN Inject 40 Units into the skin daily.    Averneni, Carmon SailsMadhavi, MD  insulin lispro (HUMALOG) 100 UNIT/ML injection Inject 5-10 Units into the skin See admin instructions. Per sliding scale 5-10  units three times daily before meals    Averneni, Madhavi, MD  lansoprazole (PREVACID) 30 MG capsule Take 30 mg by mouth daily at 12 noon.    [provider]  metFORMIN (GLUCOPHAGE-XR) 500 MG 24 hr tablet Take 500 mg by mouth 2 (two) times daily.     [provider]  metoCLOPramide (REGLAN) 10 MG tablet Take 1 tablet (10 mg total) by mouth every 6 (six) hours for 30 days. Taper back down to your home dose as your symptoms begin to improve. Patient taking differently: Take 5 mg by mouth 3 (three) times daily.  08/21/18 10/04/18  Edsel Petrin, DO  metoprolol tartrate (LOPRESSOR) 25 MG tablet Take 1 tablet (25 mg total) by mouth 2 (two) times daily. 04/11/17   Glade Lloyd, MD  nitroGLYCERIN (NITROSTAT) 0.4 MG SL tablet Place 1 tablet  (0.4 mg total) under the tongue every 5 (five) minutes as needed for chest pain. 04/11/17   Glade Lloyd, MD  [facility] VERIO test strip Use to test blood sugar twice daily 09/24/17   [provider]  pantoprazole (PROTONIX) 40 MG tablet Take 1 tablet (40 mg total) by mouth 2 (two) times daily before a meal. 08/21/18   Mikhail, Nita Sells, DO  prednisoLONE acetate (PRED FORTE) 1 % ophthalmic suspension Place 1 drop into the left eye 4 (four) times daily.  07/21/18   [provider]  promethazine (PHENERGAN) 25 MG suppository Place 1 suppository (25 mg total) rectally every 6 (six) hours as needed for nausea or vomiting. 04/18/18   Long, Arlyss Repress, MD  promethazine (PHENERGAN) 25 MG tablet Take 25-50 mg by mouth every 6 (six) hours as needed for nausea or vomiting.  05/05/18   [provider]  promethazine (PHENERGAN) 25 MG/ML injection Inject 25 mg into the muscle every 6 (six) hours as needed for nausea or vomiting.  04/22/18   [provider]  ranolazine (RANEXA) 500 MG 12 hr tablet Take 1 tablet (500 mg total) by mouth 2 (two) times daily. 04/11/17   Glade Lloyd, MD  traMADol (ULTRAM) 50 MG tablet Take 100 mg by mouth every 6 (six) hours as needed for moderate pain.     [provider]    ALLERGIES:  Allergies  Allergen Reactions  . Relafen [Nabumetone] Nausea And Vomiting and Rash  . Codeine Nausea And Vomiting and Rash    SOCIAL HISTORY:  Social History   Tobacco Use  . Smoking status: Never Smoker  . Smokeless tobacco: Never Used  Substance Use Topics  . Alcohol use: No    Frequency: Never    Comment: 01/07/2014 'might have a wine cooler a couple times/yr"    FAMILY HISTORY: Family History  Problem Relation Age of Onset  . COPD Mother   . Heart disease Mother   . Lung cancer Mother   . Retinal detachment Father   . Alcoholism Brother   . Heart disease Brother   . COPD Brother   . Diabetes Brother   . Hypertension Brother   . Stroke  Brother     EXAM: BP 129/77   Temp 97.7 F (36.5 C) (Oral)   Resp 15   SpO2 97%  CONSTITUTIONAL: Alert and oriented and responds appropriately to questions. Well-appearing; well-nourished; GCS 15, in no distress, obese HEAD: Normocephalic; atraumatic EYES: Conjunctivae clear, PERRL, EOMI ENT: normal nose; no rhinorrhea; moist mucous membranes; pharynx without lesions noted; no dental injury; no septal hematoma NECK: Supple, no meningismus, no LAD; no midline spinal tenderness,  step-off or deformity; trachea midline, cervical collar in place CARD: RRR; S1 and S2 appreciated; no murmurs, no clicks, no rubs, no gallops RESP: Normal chest excursion without splinting or tachypnea; breath sounds clear and equal bilaterally; no wheezes, no rhonchi, no rales; no hypoxia or respiratory distress CHEST:  chest wall stable, no crepitus or ecchymosis or deformity, nontender to palpation; no flail chest ABD/GI: Normal bowel sounds; non-distended; soft, non-tender, no rebound, no guarding; no ecchymosis or other lesions noted PELVIS:  stable, nontender to palpation BACK:  The back appears normal and is non-tender to palpation, there is no CVA tenderness; no midline spinal tenderness, step-off or deformity EXT: Normal ROM in all joints; non-tender to palpation; no edema; normal capillary refill; no cyanosis, no bony tenderness or bony deformity of patient's extremities, no joint effusion, compartments are soft, extremities are warm and well-perfused, no ecchymosis, patient status post left BKA SKIN: Normal color for age and race; warm, superficial skin tear to the right distal shin NEURO: Moves all extremities equally normal speech, cranial nerves II through XII intact, normal sensation diffusely PSYCH: The patient's mood and manner are appropriate. Grooming and personal hygiene are appropriate.  MEDICAL DECISION MAKING: Patient here after he slipped out of bed tonight striking the top of his head.  Has a  skin tear to the right shin that does not need any repair.  Will clean, apply bacitracin and nonadherent dressing.  Tetanus vaccination up-to-date.  States he is here to obtain imaging of his head given he is on Plavix and Eliquis.  I feel this is reasonable.  Otherwise neurologically intact and in no distress.  ED PROGRESS: CT head shows no acute abnormalities.  Discussed wound care instructions for the wound to patient's right lower extremity.  Discussed head injury return precautions.  I feel he is safe to be discharged.  He is comfortable with this plan.   At this time, I do not feel there is any life-threatening condition present. I have reviewed and discussed all results (EKG, imaging, lab, urine as appropriate) and exam findings with patient/family. I have reviewed nursing notes and appropriate previous records.  I feel the patient is safe to be discharged home without further emergent workup and can continue workup as an outpatient as needed. Discussed usual and customary return precautions. Patient/family verbalize understanding and are comfortable with this plan.  Outpatient follow-up has been provided as needed. All questions have been answered.      Demetrick Eichenberger, Layla Maw, DO 10/26/18 [phone]

## 2018-10-26 NOTE — ED Notes (Signed)
Bed: WA20 Expected date:  Expected time:  Means of arrival:  Comments: 60M fell from bed, head lac

## 2018-10-27 ENCOUNTER — Ambulatory Visit: Payer: Self-pay | Admitting: Pharmacist

## 2018-10-28 ENCOUNTER — Ambulatory Visit: Payer: Self-pay | Admitting: Pharmacist

## 2018-10-29 ENCOUNTER — Ambulatory Visit: Payer: Self-pay | Admitting: Pharmacist

## 2018-11-07 ENCOUNTER — Telehealth: Payer: Self-pay | Admitting: Cardiovascular Disease

## 2018-11-07 NOTE — Telephone Encounter (Signed)
Mychart, no smartphone, consent (verbal), pre reg complete 11/07/18 AF °

## 2018-11-12 ENCOUNTER — Other Ambulatory Visit: Payer: Self-pay

## 2018-11-12 ENCOUNTER — Encounter: Payer: PPO | Admitting: Cardiovascular Disease

## 2018-11-12 ENCOUNTER — Inpatient Hospital Stay (HOSPITAL_COMMUNITY)
Admission: EM | Admit: 2018-11-12 | Discharge: 2018-11-14 | DRG: 639 | Disposition: A | Discharge status: Home / Self Care | Payer: PPO | Attending: Internal Medicine | Admitting: Internal Medicine

## 2018-11-12 ENCOUNTER — Emergency Department (HOSPITAL_COMMUNITY): Payer: PPO

## 2018-11-12 ENCOUNTER — Encounter (HOSPITAL_COMMUNITY): Payer: Self-pay | Admitting: Emergency Medicine

## 2018-11-12 DIAGNOSIS — Z86711 Personal history of pulmonary embolism: Secondary | ICD-10-CM | POA: Diagnosis not present

## 2018-11-12 DIAGNOSIS — E785 Hyperlipidemia, unspecified: Secondary | ICD-10-CM | POA: Diagnosis not present

## 2018-11-12 DIAGNOSIS — R110 Nausea: Secondary | ICD-10-CM | POA: Diagnosis not present

## 2018-11-12 DIAGNOSIS — Z8249 Family history of ischemic heart disease and other diseases of the circulatory system: Secondary | ICD-10-CM | POA: Diagnosis not present

## 2018-11-12 DIAGNOSIS — E1143 Type 2 diabetes mellitus with diabetic autonomic (poly)neuropathy: Secondary | ICD-10-CM | POA: Diagnosis present

## 2018-11-12 DIAGNOSIS — Z888 Allergy status to other drugs, medicaments and biological substances status: Secondary | ICD-10-CM

## 2018-11-12 DIAGNOSIS — Z7901 Long term (current) use of anticoagulants: Secondary | ICD-10-CM

## 2018-11-12 DIAGNOSIS — Z955 Presence of coronary angioplasty implant and graft: Secondary | ICD-10-CM

## 2018-11-12 DIAGNOSIS — Z89512 Acquired absence of left leg below knee: Secondary | ICD-10-CM

## 2018-11-12 DIAGNOSIS — E1169 Type 2 diabetes mellitus with other specified complication: Secondary | ICD-10-CM | POA: Diagnosis not present

## 2018-11-12 DIAGNOSIS — I2782 Chronic pulmonary embolism: Secondary | ICD-10-CM | POA: Diagnosis not present

## 2018-11-12 DIAGNOSIS — Z825 Family history of asthma and other chronic lower respiratory diseases: Secondary | ICD-10-CM

## 2018-11-12 DIAGNOSIS — E1010 Type 1 diabetes mellitus with ketoacidosis without coma: Secondary | ICD-10-CM

## 2018-11-12 DIAGNOSIS — E1151 Type 2 diabetes mellitus with diabetic peripheral angiopathy without gangrene: Secondary | ICD-10-CM | POA: Diagnosis not present

## 2018-11-12 DIAGNOSIS — Z7902 Long term (current) use of antithrombotics/antiplatelets: Secondary | ICD-10-CM | POA: Diagnosis not present

## 2018-11-12 DIAGNOSIS — I10 Essential (primary) hypertension: Secondary | ICD-10-CM | POA: Diagnosis present

## 2018-11-12 DIAGNOSIS — K3184 Gastroparesis: Secondary | ICD-10-CM | POA: Diagnosis not present

## 2018-11-12 DIAGNOSIS — E1110 Type 2 diabetes mellitus with ketoacidosis without coma: Secondary | ICD-10-CM | POA: Diagnosis not present

## 2018-11-12 DIAGNOSIS — Z7982 Long term (current) use of aspirin: Secondary | ICD-10-CM

## 2018-11-12 DIAGNOSIS — R0902 Hypoxemia: Secondary | ICD-10-CM | POA: Diagnosis not present

## 2018-11-12 DIAGNOSIS — M159 Polyosteoarthritis, unspecified: Secondary | ICD-10-CM | POA: Diagnosis present

## 2018-11-12 DIAGNOSIS — Z823 Family history of stroke: Secondary | ICD-10-CM

## 2018-11-12 DIAGNOSIS — R112 Nausea with vomiting, unspecified: Secondary | ICD-10-CM | POA: Diagnosis not present

## 2018-11-12 DIAGNOSIS — R000 Tachycardia, unspecified: Secondary | ICD-10-CM | POA: Diagnosis not present

## 2018-11-12 DIAGNOSIS — E860 Dehydration: Secondary | ICD-10-CM | POA: Diagnosis present

## 2018-11-12 DIAGNOSIS — Z801 Family history of malignant neoplasm of trachea, bronchus and lung: Secondary | ICD-10-CM | POA: Diagnosis not present

## 2018-11-12 DIAGNOSIS — I252 Old myocardial infarction: Secondary | ICD-10-CM

## 2018-11-12 DIAGNOSIS — Z811 Family history of alcohol abuse and dependence: Secondary | ICD-10-CM

## 2018-11-12 DIAGNOSIS — Z20828 Contact with and (suspected) exposure to other viral communicable diseases: Secondary | ICD-10-CM | POA: Diagnosis present

## 2018-11-12 DIAGNOSIS — G4733 Obstructive sleep apnea (adult) (pediatric): Secondary | ICD-10-CM | POA: Diagnosis present

## 2018-11-12 DIAGNOSIS — E1165 Type 2 diabetes mellitus with hyperglycemia: Secondary | ICD-10-CM | POA: Diagnosis not present

## 2018-11-12 DIAGNOSIS — Z6831 Body mass index (BMI) 31.0-31.9, adult: Secondary | ICD-10-CM

## 2018-11-12 DIAGNOSIS — I2510 Atherosclerotic heart disease of native coronary artery without angina pectoris: Secondary | ICD-10-CM | POA: Diagnosis present

## 2018-11-12 DIAGNOSIS — E669 Obesity, unspecified: Secondary | ICD-10-CM | POA: Diagnosis present

## 2018-11-12 DIAGNOSIS — Z66 Do not resuscitate: Secondary | ICD-10-CM | POA: Diagnosis present

## 2018-11-12 DIAGNOSIS — Z833 Family history of diabetes mellitus: Secondary | ICD-10-CM

## 2018-11-12 DIAGNOSIS — Z885 Allergy status to narcotic agent status: Secondary | ICD-10-CM

## 2018-11-12 DIAGNOSIS — Z794 Long term (current) use of insulin: Secondary | ICD-10-CM | POA: Diagnosis not present

## 2018-11-12 DIAGNOSIS — E11610 Type 2 diabetes mellitus with diabetic neuropathic arthropathy: Secondary | ICD-10-CM | POA: Diagnosis present

## 2018-11-12 DIAGNOSIS — J449 Chronic obstructive pulmonary disease, unspecified: Secondary | ICD-10-CM | POA: Diagnosis present

## 2018-11-12 DIAGNOSIS — K219 Gastro-esophageal reflux disease without esophagitis: Secondary | ICD-10-CM | POA: Diagnosis not present

## 2018-11-12 DIAGNOSIS — I2699 Other pulmonary embolism without acute cor pulmonale: Secondary | ICD-10-CM | POA: Diagnosis present

## 2018-11-12 LAB — CBG MONITORING, ED
Glucose-Capillary: 412 mg/dL — ABNORMAL HIGH (ref 70–99)
Glucose-Capillary: 414 mg/dL — ABNORMAL HIGH (ref 70–99)
Glucose-Capillary: 384 mg/dL — ABNORMAL HIGH (ref 70–99)
Glucose-Capillary: 335 mg/dL — ABNORMAL HIGH (ref 70–99)
Glucose-Capillary: 360 mg/dL — ABNORMAL HIGH (ref 70–99)
Glucose-Capillary: 303 mg/dL — ABNORMAL HIGH (ref 70–99)
Glucose-Capillary: 276 mg/dL — ABNORMAL HIGH (ref 70–99)

## 2018-11-12 LAB — BASIC METABOLIC PANEL
Anion gap: 18 — ABNORMAL HIGH (ref 5–15)
Anion gap: 13 (ref 5–15)
Anion gap: 8 (ref 5–15)
BUN: 20 mg/dL (ref 8–23)
BUN: 18 mg/dL (ref 8–23)
BUN: 18 mg/dL (ref 8–23)
CO2: 18 mmol/L — ABNORMAL LOW (ref 22–32)
CO2: 20 mmol/L — ABNORMAL LOW (ref 22–32)
CO2: 23 mmol/L (ref 22–32)
Calcium: 8.9 mg/dL (ref 8.9–10.3)
Calcium: 8.9 mg/dL (ref 8.9–10.3)
Calcium: 8.7 mg/dL — ABNORMAL LOW (ref 8.9–10.3)
Chloride: 102 mmol/L (ref 98–111)
Chloride: 106 mmol/L (ref 98–111)
Chloride: 107 mmol/L (ref 98–111)
Creatinine, Ser: 1.24 mg/dL (ref 0.61–1.24)
Creatinine, Ser: 1.07 mg/dL (ref 0.61–1.24)
Creatinine, Ser: 0.86 mg/dL (ref 0.61–1.24)
GFR calc Af Amer: >60 mL/min (ref >60)
GFR calc Af Amer: >60 mL/min (ref >60)
GFR calc Af Amer: >60 mL/min (ref >60)
GFR calc non Af Amer: >60 mL/min (ref >60)
GFR calc non Af Amer: >60 mL/min (ref >60)
GFR calc non Af Amer: >60 mL/min (ref >60)
Glucose, Bld: 403 mg/dL — ABNORMAL HIGH (ref 70–99)
Glucose, Bld: 252 mg/dL — ABNORMAL HIGH (ref 70–99)
Glucose, Bld: 213 mg/dL — ABNORMAL HIGH (ref 70–99)
Potassium: 4.4 mmol/L (ref 3.5–5.1)
Potassium: 4.3 mmol/L (ref 3.5–5.1)
Potassium: 3.7 mmol/L (ref 3.5–5.1)
Sodium: 138 mmol/L (ref 135–145)
Sodium: 139 mmol/L (ref 135–145)
Sodium: 138 mmol/L (ref 135–145)

## 2018-11-12 LAB — CBC
HCT: 46 % (ref 39.0–52.0)
Hemoglobin: 15.6 g/dL (ref 13.0–17.0)
MCH: 30.8 pg (ref 26.0–34.0)
MCHC: 33.9 g/dL (ref 30.0–36.0)
MCV: 90.9 fL (ref 80.0–100.0)
Platelets: 282 10*3/uL (ref 150–400)
RBC: 5.06 MIL/uL (ref 4.22–5.81)
RDW: 12.9 % (ref 11.5–15.5)
WBC: 10.7 10*3/uL — ABNORMAL HIGH (ref 4.0–10.5)
nRBC: 0 % (ref 0.0–0.2)

## 2018-11-12 LAB — RAPID URINE DRUG SCREEN, HOSP PERFORMED
Amphetamines: NOT DETECTED
Barbiturates: NOT DETECTED
Benzodiazepines: NOT DETECTED
Cocaine: NOT DETECTED
Opiates: NOT DETECTED
Tetrahydrocannabinol: NOT DETECTED

## 2018-11-12 LAB — POCT I-STAT EG7
Bicarbonate: 22.2 mmol/L (ref 20.0–28.0)
Calcium, Ion: 1.1 mmol/L — ABNORMAL LOW (ref 1.15–1.40)
HCT: 41 % (ref 39.0–52.0)
Hemoglobin: 13.9 g/dL (ref 13.0–17.0)
O2 Saturation: 99 %
Patient temperature: 37
Potassium: 4.3 mmol/L (ref 3.5–5.1)
Sodium: 135 mmol/L (ref 135–145)
TCO2: 23 mmol/L (ref 22–32)
pCO2, Ven: 27.7 mmHg — ABNORMAL LOW (ref 44.0–60.0)
pH, Ven: 7.513 — ABNORMAL HIGH (ref 7.250–7.430)
pO2, Ven: 141 mmHg — ABNORMAL HIGH (ref 32.0–45.0)

## 2018-11-12 LAB — GLUCOSE, CAPILLARY
Glucose-Capillary: 225 mg/dL — ABNORMAL HIGH (ref 70–99)
Glucose-Capillary: 175 mg/dL — ABNORMAL HIGH (ref 70–99)
Glucose-Capillary: 191 mg/dL — ABNORMAL HIGH (ref 70–99)
Glucose-Capillary: 205 mg/dL — ABNORMAL HIGH (ref 70–99)
Glucose-Capillary: 200 mg/dL — ABNORMAL HIGH (ref 70–99)
Glucose-Capillary: 169 mg/dL — ABNORMAL HIGH (ref 70–99)
Glucose-Capillary: 133 mg/dL — ABNORMAL HIGH (ref 70–99)
Glucose-Capillary: 135 mg/dL — ABNORMAL HIGH (ref 70–99)

## 2018-11-12 LAB — HEMOGLOBIN A1C
Hgb A1c MFr Bld: 10.4 % — ABNORMAL HIGH (ref 4.8–5.6)
Mean Plasma Glucose: 251.78 mg/dL

## 2018-11-12 LAB — COMPREHENSIVE METABOLIC PANEL
ALT: 28 U/L (ref 0–44)
AST: 28 U/L (ref 15–41)
Albumin: 3.5 g/dL (ref 3.5–5.0)
Alkaline Phosphatase: 86 U/L (ref 38–126)
Anion gap: 20 — ABNORMAL HIGH (ref 5–15)
BUN: 21 mg/dL (ref 8–23)
CO2: 18 mmol/L — ABNORMAL LOW (ref 22–32)
Calcium: 9.5 mg/dL (ref 8.9–10.3)
Chloride: 97 mmol/L — ABNORMAL LOW (ref 98–111)
Creatinine, Ser: 1.21 mg/dL (ref 0.61–1.24)
GFR calc Af Amer: >60 mL/min (ref >60)
GFR calc non Af Amer: >60 mL/min (ref >60)
Glucose, Bld: 444 mg/dL — ABNORMAL HIGH (ref 70–99)
Potassium: 4.3 mmol/L (ref 3.5–5.1)
Sodium: 135 mmol/L (ref 135–145)
Total Bilirubin: 1.4 mg/dL — ABNORMAL HIGH (ref 0.3–1.2)
Total Protein: 7.6 g/dL (ref 6.5–8.1)

## 2018-11-12 LAB — MRSA PCR SCREENING: MRSA by PCR: NEGATIVE

## 2018-11-12 LAB — SARS CORONAVIRUS 2 BY RT PCR (HOSPITAL ORDER, PERFORMED IN ~~LOC~~ HOSPITAL LAB): SARS Coronavirus 2: NEGATIVE

## 2018-11-12 LAB — LIPASE, BLOOD: Lipase: 18 U/L (ref 11–51)

## 2018-11-12 MED ORDER — ASPIRIN 81 MG PO CHEW
81.0000 mg | CHEWABLE_TABLET | Freq: Every day | ORAL | Status: DC
  Administered 2018-11-13 – 2018-11-14 (×2): 81 mg via ORAL
  Filled 2018-11-12 (×2): qty 1

## 2018-11-12 MED ORDER — ONDANSETRON HCL 4 MG/2ML IJ SOLN
4.0000 mg | Freq: Four times a day (QID) | INTRAMUSCULAR | Status: DC | PRN
  Administered 2018-11-12 – 2018-11-13 (×2): 4 mg via INTRAVENOUS
  Filled 2018-11-12 (×2): qty 2

## 2018-11-12 MED ORDER — SODIUM CHLORIDE 0.9 % IV SOLN: INTRAVENOUS | Status: AC

## 2018-11-12 MED ORDER — INSULIN ASPART 100 UNIT/ML ~~LOC~~ SOLN
0.0000 [IU] | Freq: Every day | SUBCUTANEOUS | Status: DC
  Administered 2018-11-13: 21:00:00 2 [IU] via SUBCUTANEOUS

## 2018-11-12 MED ORDER — DEXTROSE-NACL 5-0.45 % IV SOLN
INTRAVENOUS | Status: DC
  Administered 2018-11-12 (×2): via INTRAVENOUS

## 2018-11-12 MED ORDER — SODIUM CHLORIDE 0.9% FLUSH
10.0000 mL | Freq: Two times a day (BID) | INTRAVENOUS | Status: DC
  Administered 2018-11-12 – 2018-11-13 (×3): 10 mL

## 2018-11-12 MED ORDER — METOCLOPRAMIDE HCL 5 MG/ML IJ SOLN
10.0000 mg | Freq: Once | INTRAMUSCULAR | Status: AC
  Administered 2018-11-12: 08:00:00 10 mg via INTRAVENOUS
  Filled 2018-11-12: qty 2

## 2018-11-12 MED ORDER — DEXTROSE-NACL 5-0.45 % IV SOLN: INTRAVENOUS | Status: DC

## 2018-11-12 MED ORDER — PROMETHAZINE HCL 25 MG RE SUPP
25.0000 mg | Freq: Four times a day (QID) | RECTAL | Status: DC | PRN
  Filled 2018-11-12: qty 1

## 2018-11-12 MED ORDER — GABAPENTIN 300 MG PO CAPS
300.0000 mg | ORAL_CAPSULE | Freq: Three times a day (TID) | ORAL | Status: DC
  Administered 2018-11-12 – 2018-11-14 (×5): 300 mg via ORAL
  Filled 2018-11-12 (×5): qty 1

## 2018-11-12 MED ORDER — CLOPIDOGREL BISULFATE 75 MG PO TABS
75.0000 mg | ORAL_TABLET | Freq: Every day | ORAL | Status: DC
  Administered 2018-11-13 – 2018-11-14 (×2): 75 mg via ORAL
  Filled 2018-11-12 (×2): qty 1

## 2018-11-12 MED ORDER — RANOLAZINE ER 500 MG PO TB12
500.0000 mg | ORAL_TABLET | Freq: Two times a day (BID) | ORAL | Status: DC
  Administered 2018-11-12 – 2018-11-14 (×4): 500 mg via ORAL
  Filled 2018-11-12 (×4): qty 1

## 2018-11-12 MED ORDER — PROMETHAZINE HCL 25 MG/ML IJ SOLN
25.0000 mg | Freq: Once | INTRAMUSCULAR | Status: AC
  Administered 2018-11-12: 06:00:00 25 mg via INTRAVENOUS
  Filled 2018-11-12: qty 1

## 2018-11-12 MED ORDER — PANTOPRAZOLE SODIUM 40 MG PO TBEC
40.0000 mg | DELAYED_RELEASE_TABLET | Freq: Two times a day (BID) | ORAL | Status: DC
  Administered 2018-11-13 – 2018-11-14 (×3): 40 mg via ORAL
  Filled 2018-11-12 (×3): qty 1

## 2018-11-12 MED ORDER — POTASSIUM CHLORIDE 10 MEQ/100ML IV SOLN
10.0000 meq | INTRAVENOUS | Status: AC
  Administered 2018-11-12 (×2): 10 meq via INTRAVENOUS
  Filled 2018-11-12 (×2): qty 100

## 2018-11-12 MED ORDER — MUPIROCIN CALCIUM 2 % EX CREA
TOPICAL_CREAM | Freq: Two times a day (BID) | CUTANEOUS | Status: DC
  Administered 2018-11-12: 22:00:00 via TOPICAL
  Administered 2018-11-13: 1 via TOPICAL
  Administered 2018-11-13 – 2018-11-14 (×2): via TOPICAL
  Filled 2018-11-12: qty 15

## 2018-11-12 MED ORDER — INSULIN ASPART 100 UNIT/ML ~~LOC~~ SOLN: 8.0000 [IU] | Freq: Once | SUBCUTANEOUS | Status: DC

## 2018-11-12 MED ORDER — METOPROLOL TARTRATE 25 MG PO TABS
25.0000 mg | ORAL_TABLET | Freq: Two times a day (BID) | ORAL | Status: DC
  Administered 2018-11-12 – 2018-11-14 (×4): 25 mg via ORAL
  Filled 2018-11-12 (×4): qty 1

## 2018-11-12 MED ORDER — ALUM & MAG HYDROXIDE-SIMETH 200-200-20 MG/5ML PO SUSP
30.0000 mL | Freq: Four times a day (QID) | ORAL | Status: DC | PRN
  Administered 2018-11-12: 20:00:00 30 mL via ORAL
  Filled 2018-11-12: qty 30

## 2018-11-12 MED ORDER — METOCLOPRAMIDE HCL 5 MG/ML IJ SOLN
10.0000 mg | Freq: Three times a day (TID) | INTRAMUSCULAR | Status: DC
  Administered 2018-11-12 – 2018-11-13 (×2): 10 mg via INTRAVENOUS
  Filled 2018-11-12 (×2): qty 2

## 2018-11-12 MED ORDER — APIXABAN 5 MG PO TABS
5.0000 mg | ORAL_TABLET | Freq: Two times a day (BID) | ORAL | Status: DC
  Administered 2018-11-12 – 2018-11-14 (×4): 5 mg via ORAL
  Filled 2018-11-12 (×4): qty 1

## 2018-11-12 MED ORDER — INSULIN REGULAR(HUMAN) IN NACL 100-0.9 UT/100ML-% IV SOLN
INTRAVENOUS | Status: DC
  Administered 2018-11-12: 08:00:00 3.5 [IU]/h via INTRAVENOUS
  Filled 2018-11-12: qty 100

## 2018-11-12 MED ORDER — SODIUM CHLORIDE 0.9 % IV SOLN
INTRAVENOUS | Status: DC
  Administered 2018-11-12 – 2018-11-13 (×2): via INTRAVENOUS

## 2018-11-12 MED ORDER — INSULIN GLARGINE 100 UNIT/ML ~~LOC~~ SOLN
10.0000 [IU] | Freq: Every day | SUBCUTANEOUS | Status: DC
  Administered 2018-11-12: 21:00:00 10 [IU] via SUBCUTANEOUS
  Filled 2018-11-12 (×3): qty 0.1

## 2018-11-12 MED ORDER — TRAMADOL HCL 50 MG PO TABS: 100.0000 mg | ORAL_TABLET | Freq: Four times a day (QID) | ORAL | Status: DC | PRN

## 2018-11-12 MED ORDER — PREDNISOLONE ACETATE 1 % OP SUSP
1.0000 [drp] | Freq: Four times a day (QID) | OPHTHALMIC | Status: DC
  Administered 2018-11-13 – 2018-11-14 (×5): 1 [drp] via OPHTHALMIC
  Filled 2018-11-12: qty 5

## 2018-11-12 MED ORDER — INSULIN ASPART 100 UNIT/ML ~~LOC~~ SOLN
0.0000 [IU] | Freq: Three times a day (TID) | SUBCUTANEOUS | Status: DC
  Administered 2018-11-13 (×3): 7 [IU] via SUBCUTANEOUS
  Administered 2018-11-14: 06:00:00 4 [IU] via SUBCUTANEOUS

## 2018-11-12 MED ORDER — INSULIN REGULAR(HUMAN) IN NACL 100-0.9 UT/100ML-% IV SOLN
INTRAVENOUS | Status: DC
  Administered 2018-11-12: 16:00:00 10.5 [IU]/h via INTRAVENOUS
  Administered 2018-11-12: 12:00:00 12.2 [IU]/h via INTRAVENOUS
  Filled 2018-11-12: qty 100

## 2018-11-12 MED ORDER — SODIUM CHLORIDE 0.9 % IV BOLUS
1000.0000 mL | Freq: Once | INTRAVENOUS | Status: AC
  Administered 2018-11-12: 07:00:00 1000 mL via INTRAVENOUS

## 2018-11-12 MED ORDER — SODIUM CHLORIDE 0.9 % IV SOLN
INTRAVENOUS | Status: DC
  Administered 2018-11-12: 07:00:00 via INTRAVENOUS

## 2018-11-12 MED ORDER — SODIUM CHLORIDE 0.9% FLUSH: 10.0000 mL | INTRAVENOUS | Status: DC | PRN

## 2018-11-12 MED ORDER — AMLODIPINE BESYLATE 10 MG PO TABS
10.0000 mg | ORAL_TABLET | Freq: Every day | ORAL | Status: DC
  Administered 2018-11-13 – 2018-11-14 (×2): 10 mg via ORAL
  Filled 2018-11-12 (×2): qty 1

## 2018-11-12 MED ORDER — POTASSIUM CHLORIDE 10 MEQ/100ML IV SOLN
10.0000 meq | INTRAVENOUS | Status: AC
  Administered 2018-11-12 (×2): 10 meq via INTRAVENOUS
  Filled 2018-11-12 (×2): qty 100

## 2018-11-12 MED ORDER — SODIUM CHLORIDE 0.9 % IV BOLUS
1000.0000 mL | Freq: Once | INTRAVENOUS | Status: AC
  Administered 2018-11-12: 06:00:00 1000 mL via INTRAVENOUS

## 2018-11-12 MED ORDER — ACETAMINOPHEN 325 MG PO TABS
650.0000 mg | ORAL_TABLET | Freq: Four times a day (QID) | ORAL | Status: DC | PRN
  Administered 2018-11-13: 20:00:00 650 mg via ORAL
  Filled 2018-11-12: qty 2

## 2018-11-12 MED ORDER — ATORVASTATIN CALCIUM 80 MG PO TABS
80.0000 mg | ORAL_TABLET | Freq: Every day | ORAL | Status: DC
  Administered 2018-11-13: 17:00:00 80 mg via ORAL
  Filled 2018-11-12: qty 1

## 2018-11-12 NOTE — ED Triage Notes (Signed)
Pt coming from home with complaints of N/V/D for the past 4 days. Hx of gastroparesis. Has not been able to take diabetes meds in 3-4 days. CBG 464, vitals WDL. EMS gave pt 4mg  Zofran and 250cc NS

## 2018-11-12 NOTE — ED Notes (Signed)
Pt having constant nausea and dry heaving. Order given for IV Reglan

## 2018-11-12 NOTE — Consult Note (Addendum)
WOC Nurse wound consult note Reason for Consult: WOC consult requested for left leg wound. Reviewed photos and progress notes in the EMR.  Pt developed a full thickness wound to left lower calf when he bumped it recently.  Wound bed: dark reddish-brown dry scab Drainage (amount, consistency, odor) does not appear to have drainage Periwound:  dry peeling pink scaly skin surrounding the location Dressing procedure/placement/frequency: Bactroban to promote moist healing.  Foam dressing to protect from further injury. Please re-consult if further assistance is needed.  Thank-you,  Cammie Mcgee MSN, RN, CWOCN, [place], CNS [phone]

## 2018-11-12 NOTE — ED Notes (Signed)
ED TO INPATIENT HANDOFF REPORT  ED Nurse Name and Phone #: [phone]  S Name/Age/Gender Samuel [name] 68 y.o. male Room/Bed: 047C/047C  Code Status   Code Status: DNR  Home/SNF/Other Home Patient oriented to: self, place, time and situation Is this baseline? Yes   Triage Complete: Triage complete  Chief Complaint vomiting  Triage Note Pt coming from home with complaints of N/V/D for the past 4 days. Hx of gastroparesis. Has not been able to take diabetes meds in 3-4 days. CBG 464, vitals WDL. EMS gave pt  Zofran and 250cc NS   Allergies Allergies  Allergen Reactions  . Gabapentin Hives, Itching, Nausea And Vomiting and Rash    REACTION: rash/hives (per patient, was a reaction to an inactive ingredient in another mgf brand). The patient stated that he does take this medication now and it doesn't cause a rash any more.  . Relafen [Nabumetone] Nausea And Vomiting and Rash  . Codeine Nausea And Vomiting and Rash    Level of Care/Admitting Diagnosis ED Disposition    ED Disposition Condition Comment   Admit  Hospital Area: [facility] [[id number]]  Level of Care: Progressive [102]  I expect the patient will be discharged within 24 hours: Yes  LOW acuity---Tx typically complete <24 hrs---ACUTE conditions typically can be evaluated <24 hours---LABS likely to return to acceptable levels <24 hours---IS near functional baseline---EXPECTED to return to current living arrangement---NOT newly hypoxic: Does not meet criteria for 5C-Observation unit  Covid Evaluation: Screening Protocol (No Symptoms)  Diagnosis: Diabetic gastroparesis (HCC) [454098]  Admitting Physician: Jonah Blue [[id number]]  Attending Physician: Jonah Blue [[id number]]  PT Class (Do Not Modify): Observation [104]  PT Acc Code (Do Not Modify): Observation [10022]       B Medical/Surgery History Past Medical History:  Diagnosis Date  . Anxiety   . Arthritis    "all over"   . CAD (coronary  artery disease)   . Charcot's joint    "left foot"  . 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 Depression   . Gastroparesis   . GERD (gastroesophageal reflux disease)   . H/O hiatal hernia   . Hyperlipidemia   . Hypertension   . Myocardial infarction (HCC) 2017/03/27   around this date  . OSA (obstructive sleep apnea)    "not bad enough for a mask"  . Peripheral neuropathy   . Peripheral vascular disease (HCC)   . PONV (postoperative nausea and vomiting)   . Pulmonary embolism (HCC)    hx. of 2012  . Shortness of breath    exertion  . Type II diabetes mellitus (HCC)    Past Surgical History:  Procedure Laterality Date  . AMPUTATION Left 03/08/2015   Procedure:  LEFT AMPUTATION BELOW KNEE;  Surgeon: Toni Arthurs, MD;  Location: [facility];  Service: Orthopedics;  Laterality: Left;  . APPLICATION OF WOUND VAC Left 01/07/2014  . CHOLECYSTECTOMY  06/2010  . CORONARY ANGIOPLASTY WITH STENT PLACEMENT  05/2009   "1"  . FOOT SURGERY Left 2010   "for Charcot's joint"  . HARDWARE REMOVAL Left 01/07/2014   Procedure: LEFT LEG REMOVAL OF DEEP IMPLANT AND SEQUESTRECTOMY; APPLICATION OF WOUND VAC ;  Surgeon: Toni Arthurs, MD;  Location: [facility] OR;  Service: Orthopedics;  Laterality: Left;  . I&D EXTREMITY Left "multiple"   leg  . 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 IM NAILING TIBIA Left ~ 2012  . LEFT HEART CATH AND CORONARY ANGIOGRAPHY N/A 04/08/2017   Procedure: LEFT HEART CATH AND CORONARY ANGIOGRAPHY;  Surgeon: Runell Gess, MD;  Location: [facility] INVASIVE CV LAB;  Service: Cardiovascular;  Laterality: N/A;  . SHOULDER ARTHROSCOPY W/ ROTATOR CUFF REPAIR Left    "and bone spurs"  . TIBIAL IM ROD REMOVAL Left 01/07/2014  . TOE AMPUTATION Right ~ 2011   "great toe"  . VENA CAVA FILTER PLACEMENT  2012  . VENA CAVA FILTER PLACEMENT  N/A 11/20/2016   Procedure: INSERTION VENA-CAVA FILTER;  Surgeon: Sherren Kerns, MD;  Location: [facility] OR;  Service: Vascular;  Laterality: N/A;  . WOUND DEBRIDEMENT Left 01/07/2014   "tibia"     A IV Location/Drains/Wounds Patient Lines/Drains/Airways Status   Active Line/Drains/Airways    Name:   Placement date:   Placement time:   Site:   Days:   Peripheral IV 11/12/18 Distal;Posterior;Right Forearm   11/12/18    -    Forearm   less than 1          Intake/Output Last 24 hours  Intake/Output Summary (Last 24 hours) at 11/12/2018 1243 Last data filed at 11/12/2018 1158 Gross per 24 hour  Intake 2982.05 ml  Output -  Net 2982.05 ml    Labs/Imaging Results for orders placed or performed during the hospital encounter of 11/12/18 (from the past 48 hour(s))  CBG monitoring, ED     Status: Abnormal   Collection Time: 11/12/18  5:18 AM  Result Value Ref Range   Glucose-Capillary 412 (H) 70 - 99 mg/dL  CBC     Status: Abnormal   Collection Time: 11/12/18  5:42 AM  Result Value Ref Range   WBC 10.7 (H) 4.0 - 10.5 K/uL   RBC 5.06 4.22 - 5.81 MIL/uL   Hemoglobin 15.6 13.0 - 17.0 g/dL   HCT 81.1 91.4 - 78.2 %   MCV 90.9 80.0 - 100.0 fL   MCH 30.8 26.0 - 34.0 pg   MCHC 33.9 30.0 - 36.0 g/dL   RDW 95.6 21.3 - 08.6 %   Platelets 282 150 - 400 K/uL   nRBC 0.0 0.0 - 0.2 %    Comment: Performed at [facility], [address] [place], [place] [address]  Comprehensive metabolic panel     Status: Abnormal   Collection Time: 11/12/18  5:42 AM  Result Value Ref Range   Sodium 135 135 - 145 mmol/L   Potassium 4.3 3.5 - 5.1 mmol/L   Chloride 97 (L) 98 - 111 mmol/L   CO2 18 (L) 22 - 32 mmol/L   Glucose, Bld 444 (H) 70 - 99 mg/dL   BUN 21 8 - 23 mg/dL   Creatinine, Ser 9.62 0.61 - 1.24 mg/dL   Calcium 9.5 8.9 - 95.2 mg/dL   Total Protein 7.6 6.5 - 8.1 g/dL   Albumin 3.5 3.5 - 5.0 g/dL   AST 28 15 - 41 U/L   ALT 28 0 - 44 U/L   Alkaline Phosphatase 86 38 - 126 U/L   Total  Bilirubin 1.4 (H) 0.3 - 1.2 mg/dL   GFR calc non Af Amer >60 >60 mL/min   GFR calc Af Amer >60 >60 mL/min   Anion gap 20 (H) 5 - 15    Comment: Performed at [facility], [address], [place], [place] [address]  POCT I-Stat EG7  Status: Abnormal   Collection Time: 11/12/18  5:50 AM  Result Value Ref Range   pH, Ven 7.513 (H) 7.250 - 7.430   pCO2, Ven 27.7 (L) 44.0 - 60.0 mmHg   pO2, Ven 141.0 (H) 32.0 - 45.0 mmHg   Bicarbonate 22.2 20.0 - 28.0 mmol/L   TCO2 23 22 - 32 mmol/L   O2 Saturation 99.0 %   Sodium 135 135 - 145 mmol/L   Potassium 4.3 3.5 - 5.1 mmol/L   Calcium, Ion 1.10 (L) 1.15 - 1.40 mmol/L   HCT 41.0 39.0 - 52.0 %   Hemoglobin 13.9 13.0 - 17.0 g/dL   Patient temperature 16.1 C    Sample type VENOUS   SARS Coronavirus 2 (CEPHEID - Performed in [facility] hospital lab), Hosp Order     Status: None   Collection Time: 11/12/18  6:30 AM  Result Value Ref Range   SARS Coronavirus 2 NEGATIVE NEGATIVE    Comment: (NOTE) If result is NEGATIVE SARS-CoV-2 target nucleic acids are NOT DETECTED. The SARS-CoV-2 RNA is generally detectable in upper and lower  respiratory specimens during the acute phase of infection. The lowest  concentration of SARS-CoV-2 viral copies this assay can detect is 250  copies / mL. A negative result does not preclude SARS-CoV-2 infection  and should not be used as the sole basis for treatment or other  patient management decisions.  A negative result may occur with  improper specimen collection / handling, submission of specimen other  than nasopharyngeal swab, presence of viral mutation(s) within the  areas targeted by this assay, and inadequate number of viral copies  (<250 copies / mL). A negative result must be combined with clinical  observations, patient history, and epidemiological information. If result is POSITIVE SARS-CoV-2 target nucleic acids are DETECTED. The SARS-CoV-2 RNA is generally detectable in upper and lower   respiratory specimens dur ing the acute phase of infection.  Positive  results are indicative of active infection with SARS-CoV-2.  Clinical  correlation with patient history and other diagnostic information is  necessary to determine patient infection status.  Positive results do  not rule out bacterial infection or co-infection with other viruses. If result is PRESUMPTIVE POSTIVE SARS-CoV-2 nucleic acids MAY BE PRESENT.   A presumptive positive result was obtained on the submitted specimen  and confirmed on repeat testing.  While 2019 novel coronavirus  (SARS-CoV-2) nucleic acids may be present in the submitted sample  additional confirmatory testing may be necessary for epidemiological  and / or clinical management purposes  to differentiate between  SARS-CoV-2 and other Sarbecovirus currently known to infect humans.  If clinically indicated additional testing with an alternate test  methodology [phone]) is advised. The SARS-CoV-2 RNA is generally  detectable in upper and lower respiratory sp ecimens during the acute  phase of infection. The expected result is Negative. Fact Sheet for Patients:  BoilerBrush.com.cy Fact Sheet for Healthcare Providers: https://pope.com/ This test is not yet approved or cleared by the [place] FDA and has been authorized for detection and/or diagnosis of SARS-CoV-2 by FDA under an Emergency Use Authorization (EUA).  This EUA will remain in effect (meaning this test can be used) for the duration of the COVID-19 declaration under Section 564(b)(1) of the Act, 21 U.S.C. section 360bbb-3(b)(1), unless the authorization is terminated or revoked sooner. Performed at [facility], [address], [place], [place] [address]   CBG monitoring, ED     Status: Abnormal   Collection Time:  11/12/18  7:22 AM  Result Value Ref Range   Glucose-Capillary 414 (H) 70 - 99 mg/dL  CBG monitoring, ED      Status: Abnormal   Collection Time: 11/12/18  8:44 AM  Result Value Ref Range   Glucose-Capillary 384 (H) 70 - 99 mg/dL  Lipase, blood     Status: None   Collection Time: 11/12/18  9:18 AM  Result Value Ref Range   Lipase 18 11 - 51 U/L    Comment: Performed at [facility], [address], [name], [place] [address]  Basic metabolic panel     Status: Abnormal   Collection Time: 11/12/18  9:18 AM  Result Value Ref Range   Sodium 138 135 - 145 mmol/L   Potassium 4.4 3.5 - 5.1 mmol/L   Chloride 102 98 - 111 mmol/L   CO2 18 (L) 22 - 32 mmol/L   Glucose, Bld 403 (H) 70 - 99 mg/dL   BUN 20 8 - 23 mg/dL   Creatinine, Ser 6.04 0.61 - 1.24 mg/dL   Calcium 8.9 8.9 - 54.0 mg/dL   GFR calc non Af Amer >60 >60 mL/min   GFR calc Af Amer >60 >60 mL/min   Anion gap 18 (H) 5 - 15    Comment: Performed at [facility], [address], [place], [place] [address]  CBG monitoring, ED     Status: Abnormal   Collection Time: 11/12/18  9:48 AM  Result Value Ref Range   Glucose-Capillary 335 (H) 70 - 99 mg/dL  CBG monitoring, ED     Status: Abnormal   Collection Time: 11/12/18 10:49 AM  Result Value Ref Range   Glucose-Capillary 360 (H) 70 - 99 mg/dL  CBG monitoring, ED     Status: Abnormal   Collection Time: 11/12/18 11:53 AM  Result Value Ref Range   Glucose-Capillary 303 (H) 70 - 99 mg/dL   Comment 1 Notify RN    Comment 2 Document in Chart    Dg Chest 1 View  Result Date: 11/12/2018 CLINICAL DATA:  Hypoxia EXAM: CHEST  1 VIEW COMPARISON:  10/01/2018 FINDINGS: Normal heart size and mediastinal contours. There is no edema, consolidation, effusion, or pneumothorax. IMPRESSION: No evidence of active disease. Electronically Signed   By: Marnee Spring M.D.   On: 11/12/2018 07:03    Pending Labs Unresulted Labs (From admission, onward)    Start     Ordered   11/12/18 1400  Basic metabolic panel  STAT Now then every 4 hours ,   STAT     11/12/18 1138   11/12/18 1136  Urine rapid  drug screen (hosp performed)not at [facility]  Once,   R     11/12/18 1138          Vitals/Pain Today's Vitals   11/12/18 1134 11/12/18 1136 11/12/18 1137 11/12/18 1200  BP: 118/70   113/68  Pulse: (!) 108   (!) 108  Resp: (!) 28   20  Temp:      TempSrc:      SpO2: 93% 94%  93%  Weight:      Height:      PainSc:   0-No pain     Isolation Precautions No active isolations  Medications Medications  aspirin chewable tablet 81 mg (has no administration in time range)  traMADol (ULTRAM) tablet 100 mg (has no administration in time range)  amLODipine (NORVASC) tablet 10 mg (has no administration in time range)  atorvastatin (LIPITOR) tablet  80 mg (has no administration in time range)  metoprolol tartrate (LOPRESSOR) tablet 25 mg (has no administration in time range)  ranolazine (RANEXA) 12 hr tablet 500 mg (has no administration in time range)  pantoprazole (PROTONIX) EC tablet 40 mg (has no administration in time range)  apixaban (ELIQUIS) tablet 5 mg (has no administration in time range)  clopidogrel (PLAVIX) tablet 75 mg (has no administration in time range)  gabapentin (NEURONTIN) capsule 300 mg (has no administration in time range)  promethazine (PHENERGAN) suppository 25 mg (has no administration in time range)  prednisoLONE acetate (PRED FORTE) 1 % ophthalmic suspension 1 drop (has no administration in time range)  0.9 %  sodium chloride infusion (has no administration in time range)  0.9 %  sodium chloride infusion ( Intravenous New Bag/Given 11/12/18 1159)  dextrose 5 %-0.45 % sodium chloride infusion (has no administration in time range)  potassium chloride 10 mEq in 100 mL IVPB (10 mEq Intravenous New Bag/Given 11/12/18 1209)  insulin regular, human (MYXREDLIN) 100 units/ 100 mL infusion (12.2 Units/hr Intravenous New Bag/Given 11/12/18 1202)  acetaminophen (TYLENOL) tablet 650 mg (has no administration in time range)  ondansetron (ZOFRAN) injection 4 mg (has no administration  in time range)  metoCLOPramide (REGLAN) injection 10 mg (has no administration in time range)  mupirocin cream (BACTROBAN) 2 % (has no administration in time range)  sodium chloride 0.9 % bolus 1,000 mL (0 mLs Intravenous Stopped 11/12/18 0742)    And  sodium chloride 0.9 % bolus 1,000 mL (0 mLs Intravenous Stopped 11/12/18 0852)  promethazine (PHENERGAN) injection 25 mg (25 mg Intravenous Given 11/12/18 0617)  potassium chloride 10 mEq in 100 mL IVPB (0 mEq Intravenous Stopped 11/12/18 0949)  metoCLOPramide (REGLAN) injection 10 mg (10 mg Intravenous Given 11/12/18 0753)    Mobility power wheelchair Moderate fall risk   Focused Assessments Gastrointerstinal   R Recommendations: See Admitting Provider Note  Report given to:   Additional Notes:  Insulin drip to be titrated at 1302 again. Pt on 3L [place] due to desat to 80s on room air, does not wear oxygen at home. Hx of sleep apnea, was suppose to have a sleep study next week to get CPAP at night.

## 2018-11-12 NOTE — ED Notes (Signed)
Condom cath placed on pt 

## 2018-11-12 NOTE — H&P (Signed)
History and Physical    Samuel [name]:[id number] DOB: 08-07-1950 DOA: 11/12/2018  PCP: [name], MD Consultants:  Allyson Sabal - cardiology; Byrum - pulmnology Patient coming from:  Home - lives alone; NOK: Brother - he doesn't have a Producer, television/film/video Complaint: N/V/D  HPI: Samuel [name] is a 68 y.o. male with medical history significant of DM with gastroparesis; PE (2012), on Eliquis; PVD s/p L BKA; OSA not on CPAP; COPD not on home O2; CAD; HTN; and HLD presenting with n/v/d.  "Gastroparesis real bad x the last 2 days."  H/o simliar presentations.  He has not had insulin in 2 days.  No hematemesis.  No fevers.  He does have some diarrhea.  No recent antibiotics.  No sick contacts.    He was last admitted 4/15-18 with similar presentation.   ED Course:  DKA, HCO3 18, gap 20.  Hyperventilating.  Gastroparesis flare, decreased PO intake and not taking insulin.  He is dehydrated, started on IVF and drip.  Intermittent hypoxia due to hyperventilation, resolves spontaneously.  Not likely COVID.  Review of Systems: As per HPI; otherwise review of systems reviewed and negative.   Ambulatory Status:  S/p amputation  Past Medical History:  Diagnosis Date  . Anxiety   . Arthritis    "all over"   . CAD (coronary artery disease)   . Charcot's joint    "left foot"  . 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 Depression   . Gastroparesis   . GERD (gastroesophageal reflux disease)   . H/O hiatal hernia   . Hyperlipidemia   . Hypertension   . Myocardial infarction (HCC) 2017/03/27   around this date  . OSA (obstructive sleep apnea)    "not bad enough for a mask"  . Peripheral neuropathy   . Peripheral vascular disease (HCC)   . PONV (postoperative nausea and vomiting)   . Pulmonary embolism (HCC)    hx. of 2012  . Shortness of breath    exertion  . Type II diabetes mellitus (HCC)     Past Surgical History:  Procedure Laterality Date  . AMPUTATION Left 03/08/2015   Procedure:  LEFT AMPUTATION BELOW KNEE;  Surgeon: Toni Arthurs, MD;  Location: [facility];  Service: Orthopedics;  Laterality: Left;  . APPLICATION OF WOUND VAC Left 01/07/2014  . CHOLECYSTECTOMY  06/2010  . CORONARY ANGIOPLASTY WITH STENT PLACEMENT  05/2009   "1"  . FOOT SURGERY Left 2010   "for Charcot's joint"  . HARDWARE REMOVAL Left 01/07/2014   Procedure: LEFT LEG REMOVAL OF DEEP IMPLANT AND SEQUESTRECTOMY; APPLICATION OF WOUND VAC ;  Surgeon: Toni Arthurs, MD;  Location: [facility] OR;  Service: Orthopedics;  Laterality: Left;  . I&D EXTREMITY Left "multiple"   leg  . 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 IM NAILING TIBIA Left ~ 2012  . LEFT HEART CATH AND CORONARY ANGIOGRAPHY N/A 04/08/2017   Procedure: LEFT HEART CATH AND CORONARY ANGIOGRAPHY;  Surgeon: Runell Gess, MD;  Location: [facility];  Service: Cardiovascular;  Laterality: N/A;  . SHOULDER ARTHROSCOPY W/ ROTATOR CUFF REPAIR Left    "and bone spurs"  . TIBIAL IM ROD REMOVAL Left 01/07/2014  . TOE AMPUTATION Right ~ 2011   "great toe"  . VENA CAVA FILTER PLACEMENT  2012  . VENA CAVA FILTER PLACEMENT  N/A 11/20/2016   Procedure: INSERTION VENA-CAVA FILTER;  Surgeon: Sherren Kerns, MD;  Location: [name] OR;  Service: Vascular;  Laterality: N/A;  . WOUND DEBRIDEMENT Left 01/07/2014   "tibia"    Social History   Socioeconomic History  . Marital status: Divorced    Spouse name: Not on file  . Number of children: Not on file  . Years of education: 29  . Highest education level: Not on file  Occupational History  . Occupation: DISABLITY    Employer: UNEMPLOYED  Social Needs  . Financial resource strain: Not on file  . Food insecurity:    Worry: Not on file    Inability: Not on file  . Transportation needs:    Medical: Not on file    Non-medical: Not on file  Tobacco Use  .  Smoking status: Never Smoker  . Smokeless tobacco: Never Used  Substance and Sexual Activity  . Alcohol use: No    Frequency: Never    Comment: 01/07/2014 'might have a wine cooler a couple times/yr"  . Drug use: No  . Sexual activity: Never  Lifestyle  . Physical activity:    Days per week: Not on file    Minutes per session: Not on file  . Stress: Only a little  Relationships  . Social connections:    Talks on phone: Never    Gets together: Never    Attends religious service: Never    Active member of club or organization: No    Attends meetings of clubs or organizations: Never    Relationship status: Divorced  . Intimate partner violence:    Fear of current or ex partner: Not on file    Emotionally abused: Not on file    Physically abused: Not on file    Forced sexual activity: Not on file  Other Topics Concern  . Not on file  Social History Narrative  . Not on file    Allergies  Allergen Reactions  . Gabapentin Hives, Itching, Nausea And Vomiting and Rash    REACTION: rash/hives (per patient, was a reaction to an inactive ingredient in another mgf brand). The patient stated that he does take this medication now and it doesn't cause a rash any more.  . Relafen [Nabumetone] Nausea And Vomiting and Rash  . Codeine Nausea And Vomiting and Rash    Family History  Problem Relation Age of Onset  . COPD Mother   . Heart disease Mother   . Lung cancer Mother   . Retinal detachment Father   . Alcoholism Brother   . Heart disease Brother   . COPD Brother   . Diabetes Brother   . Hypertension Brother   . Stroke Brother     Prior to Admission medications   Medication Sig Start Date End Date Taking? Authorizing Provider  acarbose (PRECOSE) 50 MG tablet Take 50 mg by mouth 3 (three) times daily with meals.    [provider]  amLODipine (NORVASC) 10 MG tablet Take 1 tablet (10 mg total) by mouth daily. 11/22/16   Richarda Overlie, MD  apixaban (ELIQUIS) 5 MG TABS  tablet Take 1 tablet (5 mg total) by mouth 2 (two) times daily. 09/24/17   Osvaldo Shipper, MD  aspirin 81 MG EC tablet Take 1 tablet (81 mg total) by mouth daily. 04/12/17   Glade Lloyd, MD  atorvastatin (LIPITOR) 80 MG tablet Take 1 tablet (80 mg total) by mouth daily at 6 PM. 04/11/17   Glade Lloyd, MD  clopidogrel (PLAVIX) 75 MG tablet Take 75 mg by mouth daily.    [provider]  docusate sodium (COLACE) 100 MG capsule Take 100 mg by mouth daily.    [provider]  ferrous sulfate 325 (65 FE) MG tablet Take 325 mg by mouth every evening.     [provider]  furosemide (LASIX) 20 MG tablet Take 1 tablet (20 mg total) by mouth daily. Patient taking differently: Take 40 mg by mouth daily.  04/12/17   Glade Lloyd, MD  gabapentin (NEURONTIN) 300 MG capsule Take 300 mg by mouth 3 (three) times daily.     [provider]  Insulin Glargine (BASAGLAR KWIKPEN) 100 UNIT/ML SOPN Inject 40 Units into the skin daily.    Averneni, Carmon Sails, MD  insulin lispro (HUMALOG) 100 UNIT/ML injection Inject 5-10 Units into the skin See admin instructions. Per sliding scale 5-10 units three times daily before meals    Averneni, Madhavi, MD  lansoprazole (PREVACID) 30 MG capsule Take 30 mg by mouth daily at 12 noon.    [provider]  metFORMIN (GLUCOPHAGE-XR) 500 MG 24 hr tablet Take 500 mg by mouth 2 (two) times daily.     [provider]  metoCLOPramide (REGLAN) 10 MG tablet Take 1 tablet (10 mg total) by mouth every 6 (six) hours for 30 days. Taper back down to your home dose as your symptoms begin to improve. Patient taking differently: Take 5 mg by mouth 3 (three) times daily.  08/21/18 10/04/18  Edsel Petrin, DO  metoprolol tartrate (LOPRESSOR) 25 MG tablet Take 1 tablet (25 mg total) by mouth 2 (two) times daily. 04/11/17   Glade Lloyd, MD  nitroGLYCERIN (NITROSTAT) 0.4 MG SL tablet Place 1 tablet (0.4 mg total) under the tongue every 5 (five)  minutes as needed for chest pain. 04/11/17   Glade Lloyd, MD  [facility] VERIO test strip Use to test blood sugar twice daily 09/24/17   [provider]  pantoprazole (PROTONIX) 40 MG tablet Take 1 tablet (40 mg total) by mouth 2 (two) times daily before a meal. 08/21/18   Mikhail, Nita Sells, DO  prednisoLONE acetate (PRED FORTE) 1 % ophthalmic suspension Place 1 drop into the left eye 4 (four) times daily.  07/21/18   [provider]  promethazine (PHENERGAN) 25 MG suppository Place 1 suppository (25 mg total) rectally every 6 (six) hours as needed for nausea or vomiting. 04/18/18   Long, Arlyss Repress, MD  promethazine (PHENERGAN) 25 MG tablet Take 25-50 mg by mouth every 6 (six) hours as needed for nausea or vomiting.  05/05/18   [provider]  promethazine (PHENERGAN) 25 MG/ML injection Inject 25 mg into the muscle every 6 (six) hours as needed for nausea or vomiting.  04/22/18   [provider]  ranolazine (RANEXA) 500 MG 12 hr tablet Take 1 tablet (500 mg total) by mouth 2 (two) times daily. 04/11/17   Glade Lloyd, MD  traMADol (ULTRAM) 50 MG tablet Take 100 mg by mouth every 6 (six) hours as needed for moderate pain.     [provider]    Physical Exam: Vitals:   11/12/18 1134 11/12/18 1136 11/12/18 1200 11/12/18 1300  BP: 118/70  113/68 123/73  Pulse: (!) 108  (!) 108 (!) 106  Resp: (!) 28  20 (!) 22  Temp:      TempSrc:      SpO2: 93% 94% 93% 90%  Weight:      Height:         .  General:  Appears ill and uncomfortable with nausea . Eyes:  PERRL, EOMI, normal lids, iris . ENT:  grossly normal hearing, lips & tongue, mmm; poor dentition . Neck:  no LAD, masses or thyromegaly . Cardiovascular:  RRR, no m/r/g. No LE edema.  [name] Respiratory:   CTA bilaterally with no wheezes/rales/rhonchi.  Normal respiratory effort. . Abdomen:  soft, NT, ND, NABS . Skin:  Superficial RLE ulceration associated with a fall, not healing well per patient      .  Musculoskeletal:  S/p L BKA . Right Lower extremity:  No LE edema.  Limited foot exam with no ulcerations.  2+ distal pulses. [name] Psychiatric:  blunted mood and affect, speech fluent and appropriate, AOx3 . Neurologic:  CN 2-12 grossly intact, moves all extremities in coordinated fashion    Radiological Exams on Admission: Dg Chest 1 View  Result Date: 11/12/2018 CLINICAL DATA:  Hypoxia EXAM: CHEST  1 VIEW COMPARISON:  10/01/2018 FINDINGS: Normal heart size and mediastinal contours. There is no edema, consolidation, effusion, or pneumothorax. IMPRESSION: No evidence of active disease. Electronically Signed   By: Marnee Spring M.D.   On: 11/12/2018 07:03    EKG: Independently reviewed.  NSR with rate 97; nonspecific ST changes with no evidence of acute ischemia   Labs on Admission: I have personally reviewed the available labs and imaging studies at the time of the admission.  Pertinent labs:   CO2 18 BUN 21/Creatinine 1.21/GFR >60; 7/1.01/>60 on 4/18 Anion gap 20 WBC 10.7 VBG: 7.513/27.7/22.2 COVID negative   Assessment/Plan Principal Problem:   DKA (diabetic ketoacidoses) (HCC) Active Problems:   OBSTRUCTIVE SLEEP APNEA   Pulmonary embolism (HCC)   Essential hypertension   Hyperlipidemia due to type 2 diabetes mellitus (HCC)   Diabetic gastroparesis (HCC)   COPD (chronic obstructive pulmonary disease) (HCC)   DKA -Patient with poor baseline control, A1c 8.7 in 2/20 -At approximate baseline until he developed n/v c/w his usual presentation of gastroparesis -No indication of illness as source other than some diarrhea; if continuing to have loose stools as inpatient, consider C diff -Mild DKA on admission based on pH 7.513, HCO3 18 -Will observe in SDU with DKA protocol -K+ normal at time of presentation and so potassium supplementation added -IVF at 150 cc/hr, NS until glucose <250 and then decrease rate to 125 and change to D51/2NS -Hold PO medications - Acarbose,  Metformin -Will order diabetes coordinator consult  Diabetic gastroparesis -At home takes Reglan, Phenergan, Protonix (was actually actively taking both Prevacid once daily and Protonix twice daily; have stopped Prevacid -He has had recurrent admissions for this issue including once last month -Will continue PPI -Will continue rectal Phenergan in addition to IV Zofran -Will change Reglan to IV  H/o PE -Continue Eliquis  COPD -Not on home O2 but reports "I always use it when I'm here"  HTN -Continue Lopressor  HLD -Continue Lipitor  CAD -Continue Plavix, ASA, Ranexa  OSA -Not on home CPAP   Note: This patient has been tested and is negative for the novel coronavirus COVID-19.  DVT prophylaxis: Eliquis Code Status:  DNR - confirmed with patient Family Communication: None present; he has not asked that I contact his family and his brother does not have a telephone and so would be difficult to contact Disposition Plan:  Home once clinically improved Consults called: None  Admission status: It is my clinical opinion that referral for OBSERVATION is reasonable and necessary in this patient based on the above information  provided. The aforementioned taken together are felt to place the patient at high risk for further clinical deterioration. However it is anticipated that the patient may be medically stable for discharge from the hospital within 24 to 48 hours.    Jonah BlueJennifer Lavern Crimi MD [facility]   How to contact the [facility] Attending or Consulting provider 7A - 7P or covering provider during after hours 7P -7A, for this patient?  1. Check the care team in [facility] and look for a) attending/consulting [facility] provider listed and b) the [facility] team listed 2. Log into [email or web address] and use [facility] universal password to access. If you do not have the password, please contact the hospital operator. 3. Locate the [facility] provider you are looking for under [facility] and page to a  number that you can be directly reached. 4. If you still have difficulty reaching the provider, please page the [facility] (Director on Call) for the Hospitalists listed on amion for assistance.   11/12/2018, 2:04 PM

## 2018-11-12 NOTE — Progress Notes (Signed)
This encounter was created in error - please disregard.

## 2018-11-12 NOTE — ED Provider Notes (Signed)
[facility] EMERGENCY DEPARTMENT Provider Note  CSN: [id number] Arrival date & time: 11/12/18 0515  Chief Complaint(s) Emesis and Diarrhea  HPI Samuel [name] is a 68 y.o. male with a past medical history listed below including diabetes on insulin, gastroparesis who presents to the emergency department exacerbation of his gastroparesis.  Patient reports nausea with nonbloody nonbilious emesis and diarrhea for the past 3 days.  States that he is not been able to take his home medication due to this.  This is similar to prior presentations for gastroparesis.  Denies any associated chest pain or shortness of breath.  Denies any fevers or chills.  He is endorsing abdominal discomfort in the epigastrium due to all the emesis.  Denies any urinary symptoms.  HPI  Past Medical History Past Medical History:  Diagnosis Date  . Anxiety   . Arthritis    "all over"   . CAD (coronary artery disease)   . Charcot's joint    "left foot"  . 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 Depression   . Gastroparesis   . GERD (gastroesophageal reflux disease)   . H/O hiatal hernia   . Hyperlipidemia   . Hypertension   . Myocardial infarction (HCC) 2017/03/27   around this date  . OSA (obstructive sleep apnea)    "not bad enough for a mask"  . Peripheral neuropathy   . Peripheral vascular disease (HCC)   . PONV (postoperative nausea and vomiting)   . Pulmonary embolism (HCC)    hx. of 2012  . Shortness of breath    exertion  . Type II diabetes mellitus [facility])    Patient Active Problem List   Diagnosis Date Noted  . DKA (diabetic ketoacidoses) (HCC) 10/01/2018  . Gastroparesis 08/17/2018  . Chest pain 08/16/2018  . DKA, type 2 (HCC) 02/03/2018  . DOE (dyspnea on exertion) 01/24/2018  . Uncontrolled diabetes mellitus (HCC) 09/23/2017  . DKA, type 2, not at goal [facility]) 09/22/2017  . S/P BKA (below knee amputation) (HCC) 04/19/2017  . COPD (chronic obstructive  pulmonary disease) (HCC) 04/19/2017  . NSTEMI (non-ST elevated myocardial infarction) (HCC) 04/07/2017  . Osteomyelitis of foot (HCC)   . Protein-calorie malnutrition, severe (HCC) 03/08/2015  . Osteomyelitis of foot, acute (HCC) 03/07/2015  . Diabetes mellitus type 2, uncontrolled (HCC) 03/07/2015  . Normocytic normochromic anemia 03/07/2015  . Obesity (BMI 30-39.9) 03/07/2015  . Nausea & vomiting   . DM (diabetes mellitus), type 2 (HCC) 01/07/2014  . Chronic osteomyelitis involving lower leg (HCC) 01/07/2014  . Vomiting 12/22/2013  . Diabetic gastroparesis (HCC) 12/21/2013  . Intractable nausea and vomiting 12/21/2013  . Essential hypertension 05/01/2013  . Hyperlipidemia due to type 2 diabetes mellitus (HCC) 05/01/2013  . Generalized weakness 03/20/2013  . Back pain 03/20/2013  . Pulmonary embolism (HCC) 03/27/2011  . PERFORATION OF GALLBLADDER 08/30/2010  . Ulcer of lower limb, unspecified 03/01/2010  . METHICILLIN SUSCEPTIBLE STAPH AUREUS SEPTICEMIA 01/25/2010  . Acute osteomyelitis, ankle and foot 01/25/2010  . NAUSEA AND VOMITING 01/25/2010  . GERD 11/09/2009  . CAD S/P DES PCI to mLAD: Xience DES 2.75 x 15 (3.0 mm) 05/01/2009  . DIABETES, TYPE 1 05/14/2008  . OBSTRUCTIVE SLEEP APNEA 04/30/2008  . ALLERGIC RHINITIS, SEASONAL 04/30/2008   Home Medication(s) Prior to Admission medications   Medication Sig Start Date End Date Taking? Authorizing Provider  acarbose (PRECOSE) 50 MG tablet Take 50 mg by mouth 3 (three) times daily with meals.  [provider]  amLODipine (NORVASC) 10 MG tablet Take 1 tablet (10 mg total) by mouth daily. 11/22/16   Richarda Overlie, MD  apixaban (ELIQUIS) 5 MG TABS tablet Take 1 tablet (5 mg total) by mouth 2 (two) times daily. 09/24/17   Osvaldo Shipper, MD  aspirin 81 MG EC tablet Take 1 tablet (81 mg total) by mouth daily. 04/12/17   Glade Lloyd, MD  atorvastatin (LIPITOR) 80 MG tablet Take 1 tablet (80 mg total) by mouth daily at 6 PM.  04/11/17   Glade Lloyd, MD  clopidogrel (PLAVIX) 75 MG tablet Take 75 mg by mouth daily.    [provider]  docusate sodium (COLACE) 100 MG capsule Take 100 mg by mouth daily.    [provider]  ferrous sulfate 325 (65 FE) MG tablet Take 325 mg by mouth every evening.     [provider]  furosemide (LASIX) 20 MG tablet Take 1 tablet (20 mg total) by mouth daily. Patient taking differently: Take 40 mg by mouth daily.  04/12/17   Glade Lloyd, MD  gabapentin (NEURONTIN) 300 MG capsule Take 300 mg by mouth 3 (three) times daily.     [provider]  Insulin Glargine (BASAGLAR KWIKPEN) 100 UNIT/ML SOPN Inject 40 Units into the skin daily.    Averneni, Carmon Sails, MD  insulin lispro (HUMALOG) 100 UNIT/ML injection Inject 5-10 Units into the skin See admin instructions. Per sliding scale 5-10 units three times daily before meals    Averneni, Madhavi, MD  lansoprazole (PREVACID) 30 MG capsule Take 30 mg by mouth daily at 12 noon.    [provider]  metFORMIN (GLUCOPHAGE-XR) 500 MG 24 hr tablet Take 500 mg by mouth 2 (two) times daily.     [provider]  metoCLOPramide (REGLAN) 10 MG tablet Take 1 tablet (10 mg total) by mouth every 6 (six) hours for 30 days. Taper back down to your home dose as your symptoms begin to improve. Patient taking differently: Take 5 mg by mouth 3 (three) times daily.  08/21/18 10/04/18  Edsel Petrin, DO  metoprolol tartrate (LOPRESSOR) 25 MG tablet Take 1 tablet (25 mg total) by mouth 2 (two) times daily. 04/11/17   Glade Lloyd, MD  nitroGLYCERIN (NITROSTAT) 0.4 MG SL tablet Place 1 tablet (0.4 mg total) under the tongue every 5 (five) minutes as needed for chest pain. 04/11/17   Glade Lloyd, MD  [facility] VERIO test strip Use to test blood sugar twice daily 09/24/17   [provider]  pantoprazole (PROTONIX) 40 MG tablet Take 1 tablet (40 mg total) by mouth 2 (two) times daily before a meal. 08/21/18    Mikhail, Nita Sells, DO  prednisoLONE acetate (PRED FORTE) 1 % ophthalmic suspension Place 1 drop into the left eye 4 (four) times daily.  07/21/18   [provider]  promethazine (PHENERGAN) 25 MG suppository Place 1 suppository (25 mg total) rectally every 6 (six) hours as needed for nausea or vomiting. 04/18/18   Long, Arlyss Repress, MD  promethazine (PHENERGAN) 25 MG tablet Take 25-50 mg by mouth every 6 (six) hours as needed for nausea or vomiting.  05/05/18   [provider]  promethazine (PHENERGAN) 25 MG/ML injection Inject 25 mg into the muscle every 6 (six) hours as needed for nausea or vomiting.  04/22/18   [provider]  ranolazine (RANEXA) 500 MG 12 hr tablet Take 1 tablet (500 mg total) by mouth 2 (two) times daily. 04/11/17   Glade Lloyd, MD  traMADol (ULTRAM) 50 MG tablet Take 100 mg by mouth every 6 (six) hours as needed for moderate pain.     [provider]                                                                                                                                    Past Surgical History Past Surgical History:  Procedure Laterality Date  . AMPUTATION Left 03/08/2015   Procedure:  LEFT AMPUTATION BELOW KNEE;  Surgeon: Toni Arthurs, MD;  Location: [facility] ORS;  Service: Orthopedics;  Laterality: Left;  . APPLICATION OF WOUND VAC Left 01/07/2014  . CHOLECYSTECTOMY  06/2010  . CORONARY ANGIOPLASTY WITH STENT PLACEMENT  05/2009   "1"  . FOOT SURGERY Left 2010   "for Charcot's joint"  . HARDWARE REMOVAL Left 01/07/2014   Procedure: LEFT LEG REMOVAL OF DEEP IMPLANT AND SEQUESTRECTOMY; APPLICATION OF WOUND VAC ;  Surgeon: Toni Arthurs, MD;  Location: [facility] OR;  Service: Orthopedics;  Laterality: Left;  . I&D EXTREMITY Left "multiple"   leg  . 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 IM NAILING TIBIA Left ~  2012  . LEFT HEART CATH AND CORONARY ANGIOGRAPHY N/A 04/08/2017   Procedure: LEFT HEART CATH AND CORONARY ANGIOGRAPHY;  Surgeon: Runell Gess, MD;  Location: [facility] INVASIVE CV LAB;  Service: Cardiovascular;  Laterality: N/A;  . SHOULDER ARTHROSCOPY W/ ROTATOR CUFF REPAIR Left    "and bone spurs"  . TIBIAL IM ROD REMOVAL Left 01/07/2014  . TOE AMPUTATION Right ~ 2011   "great toe"  . VENA CAVA FILTER PLACEMENT  2012  . VENA CAVA FILTER PLACEMENT N/A 11/20/2016   Procedure: INSERTION VENA-CAVA FILTER;  Surgeon: Sherren Kerns, MD;  Location: [facility] OR;  Service: Vascular;  Laterality: N/A;  . WOUND DEBRIDEMENT Left 01/07/2014   "tibia"   Family History Family History  Problem Relation Age of Onset  . COPD Mother   . Heart disease Mother   . Lung cancer Mother   . Retinal detachment Father   . Alcoholism Brother   . Heart disease Brother   . COPD Brother   . Diabetes Brother   . Hypertension Brother   . Stroke Brother     Social History Social History   Tobacco Use  . Smoking status: Never Smoker  . Smokeless tobacco: Never Used  Substance Use Topics  . Alcohol use: No    Frequency: Never    Comment: 01/07/2014 'might have a wine cooler a couple times/yr"  . Drug use: No   Allergies Gabapentin; Relafen [nabumetone]; and Codeine  Review of Systems Review of Systems All other systems are reviewed and are negative for acute change except as noted in the HPI  Physical Exam Vital Signs  I have reviewed the triage vital  signs BP (!) 169/99   Pulse 93   Temp 98.2 F (36.8 C) (Oral)   Resp 20   Ht 6\' 3"  (1.905 m)   Wt 113.4 kg   SpO2 (!) 81%   BMI 31.25 kg/m   Physical Exam Vitals signs reviewed.  Constitutional:      General: He is not in acute distress.    Appearance: He is well-developed. He is ill-appearing. He is not diaphoretic.     Comments: In obvious discomfort  HENT:     Head: Normocephalic and atraumatic.     Comments: Vomitus around his mouth     Nose: Nose normal.  Eyes:     General: No scleral icterus.       Right eye: No discharge.        Left eye: No discharge.     Conjunctiva/sclera: Conjunctivae normal.     Pupils: Pupils are equal, round, and reactive to light.  Neck:     Musculoskeletal: Normal range of motion and neck supple.  Cardiovascular:     Rate and Rhythm: Normal rate and regular rhythm.     Heart sounds: No murmur. No friction rub. No gallop.   Pulmonary:     Effort: Pulmonary effort is normal. No respiratory distress.     Breath sounds: Normal breath sounds. No stridor. No rales.  Abdominal:     General: There is no distension.     Palpations: Abdomen is soft.     Tenderness: There is abdominal tenderness ( Discomfort) in the epigastric area. There is no guarding or rebound. Negative signs include Murphy's sign and McBurney's sign.  Musculoskeletal:        General: No tenderness.       Legs:  Skin:    General: Skin is warm and dry.     Findings: No erythema or rash.  Neurological:     Mental Status: He is alert and oriented to person, place, and time.     ED Results and Treatments Labs (all labs ordered are listed, but only abnormal results are displayed) Labs Reviewed  CBC - Abnormal; Notable for the following components:      Result Value   WBC 10.7 (*)    All other components within normal limits  COMPREHENSIVE METABOLIC PANEL - Abnormal; Notable for the following components:   Chloride 97 (*)    CO2 18 (*)    Glucose, Bld 444 (*)    Total Bilirubin 1.4 (*)    Anion gap 20 (*)    All other components within normal limits  CBG MONITORING, ED - Abnormal; Notable for the following components:   Glucose-Capillary 412 (*)    All other components within normal limits  POCT I-STAT EG7 - Abnormal; Notable for the following components:   pH, Ven 7.513 (*)    pCO2, Ven 27.7 (*)    pO2, Ven 141.0 (*)    Calcium, Ion 1.10 (*)    All other components within normal limits  CBG MONITORING, ED -  Abnormal; Notable for the following components:   Glucose-Capillary 414 (*)    All other components within normal limits  SARS CORONAVIRUS 2 (HOSPITAL ORDER, PERFORMED IN [facility])  URINALYSIS, ROUTINE W REFLEX MICROSCOPIC  LIPASE, BLOOD  I-STAT VENOUS BLOOD GAS, ED  EKG  EKG Interpretation  Date/Time:  Wednesday Nov 12 2018 06:23:16 EDT Ventricular Rate:  97 PR Interval:    QRS Duration: 85 QT Interval:  385 QTC Calculation: 490 R Axis:   -10 Text Interpretation:  Sinus rhythm Abnormal R-wave progression, early transition Abnormal inferior Q waves Borderline prolonged QT interval motion artifact NO STEMI. Confirmed by Drema Pry [id number]) on 11/12/2018 6:56:27 AM      Radiology Dg Chest 1 View  Result Date: 11/12/2018 CLINICAL DATA:  Hypoxia EXAM: CHEST  1 VIEW COMPARISON:  10/01/2018 FINDINGS: Normal heart size and mediastinal contours. There is no edema, consolidation, effusion, or pneumothorax. IMPRESSION: No evidence of active disease. Electronically Signed   By: Marnee Spring M.D.   On: 11/12/2018 07:03   Pertinent labs & imaging results that were available during my care of the patient were reviewed by me and considered in my medical decision making (see chart for details).  Medications Ordered in ED Medications  sodium chloride 0.9 % bolus 1,000 mL (1,000 mLs Intravenous New Bag/Given 11/12/18 0625)    And  sodium chloride 0.9 % bolus 1,000 mL (1,000 mLs Intravenous New Bag/Given 11/12/18 0716)    And  0.9 %  sodium chloride infusion ( Intravenous New Bag/Given 11/12/18 0722)  insulin regular, human (MYXREDLIN) 100 units/ 100 mL infusion (has no administration in time range)  potassium chloride 10 mEq in 100 mL IVPB (has no administration in time range)  dextrose 5 %-0.45 % sodium chloride infusion (has no administration in time range)   promethazine (PHENERGAN) injection 25 mg (25 mg Intravenous Given 11/12/18 0617)                                                                                                                                    Procedures .Critical Care Performed by: Nira Conn, MD Authorized by: Nira Conn, MD     CRITICAL CARE Performed by: Amadeo Garnet  Total critical care time: 35 minutes Critical care time was exclusive of separately billable procedures and treating other patients. Critical care was necessary to treat or prevent imminent or life-threatening deterioration. Critical care was time spent personally by me on the following activities: development of treatment plan with patient and/or surrogate as well as nursing, discussions with consultants, evaluation of patient's response to treatment, examination of patient, obtaining history from patient or surrogate, ordering and performing treatments and interventions, ordering and review of laboratory studies, ordering and review of radiographic studies, pulse oximetry and re-evaluation of patient's condition.   (including critical care time)  Medical Decision Making / ED Course I have reviewed the nursing notes for this encounter and the patient's prior records (if available in EHR or on provided paperwork).    Patient presents with 3 days of symptoms consistent with gastroparesis exacerbation.  Labs concerning for early DKA with a gap of 20.  Patient with Kussmaul breathing and gas notable for respiratory  alkalosis.  No evidence of biliary obstruction.  Still pending lipase.  Still awaiting UA.  Patient treated with IV fluids and insulin drip.  Admitted to medicine for further management.  Final Clinical Impression(s) / ED Diagnoses Final diagnoses:  Diabetic ketoacidosis without coma associated with type 2 diabetes mellitus (HCC)  Gastroparesis      This chart was dictated using voice recognition  software.  Despite best efforts to proofread,  errors can occur which can change the documentation meaning.   Nira Connardama,  Eduardo, MD 11/12/18 [phone]

## 2018-11-12 NOTE — ED Notes (Signed)
NURSE NAVIGATOR  Pt wanted me to call his caregiver Armanda Magic and update her, I left message for her to call me [phone]

## 2018-11-13 DIAGNOSIS — K3184 Gastroparesis: Secondary | ICD-10-CM | POA: Diagnosis present

## 2018-11-13 DIAGNOSIS — Z955 Presence of coronary angioplasty implant and graft: Secondary | ICD-10-CM | POA: Diagnosis not present

## 2018-11-13 DIAGNOSIS — Z794 Long term (current) use of insulin: Secondary | ICD-10-CM | POA: Diagnosis not present

## 2018-11-13 DIAGNOSIS — G4733 Obstructive sleep apnea (adult) (pediatric): Secondary | ICD-10-CM | POA: Diagnosis present

## 2018-11-13 DIAGNOSIS — Z825 Family history of asthma and other chronic lower respiratory diseases: Secondary | ICD-10-CM | POA: Diagnosis not present

## 2018-11-13 DIAGNOSIS — Z8249 Family history of ischemic heart disease and other diseases of the circulatory system: Secondary | ICD-10-CM | POA: Diagnosis not present

## 2018-11-13 DIAGNOSIS — Z801 Family history of malignant neoplasm of trachea, bronchus and lung: Secondary | ICD-10-CM | POA: Diagnosis not present

## 2018-11-13 DIAGNOSIS — Z20828 Contact with and (suspected) exposure to other viral communicable diseases: Secondary | ICD-10-CM | POA: Diagnosis present

## 2018-11-13 DIAGNOSIS — K219 Gastro-esophageal reflux disease without esophagitis: Secondary | ICD-10-CM | POA: Diagnosis present

## 2018-11-13 DIAGNOSIS — E1110 Type 2 diabetes mellitus with ketoacidosis without coma: Secondary | ICD-10-CM | POA: Diagnosis present

## 2018-11-13 DIAGNOSIS — Z86711 Personal history of pulmonary embolism: Secondary | ICD-10-CM | POA: Diagnosis not present

## 2018-11-13 DIAGNOSIS — J449 Chronic obstructive pulmonary disease, unspecified: Secondary | ICD-10-CM | POA: Diagnosis present

## 2018-11-13 DIAGNOSIS — Z7982 Long term (current) use of aspirin: Secondary | ICD-10-CM | POA: Diagnosis not present

## 2018-11-13 DIAGNOSIS — Z7902 Long term (current) use of antithrombotics/antiplatelets: Secondary | ICD-10-CM | POA: Diagnosis not present

## 2018-11-13 DIAGNOSIS — E1143 Type 2 diabetes mellitus with diabetic autonomic (poly)neuropathy: Secondary | ICD-10-CM | POA: Diagnosis present

## 2018-11-13 DIAGNOSIS — I10 Essential (primary) hypertension: Secondary | ICD-10-CM | POA: Diagnosis present

## 2018-11-13 DIAGNOSIS — I2510 Atherosclerotic heart disease of native coronary artery without angina pectoris: Secondary | ICD-10-CM | POA: Diagnosis present

## 2018-11-13 DIAGNOSIS — I252 Old myocardial infarction: Secondary | ICD-10-CM | POA: Diagnosis not present

## 2018-11-13 DIAGNOSIS — E1151 Type 2 diabetes mellitus with diabetic peripheral angiopathy without gangrene: Secondary | ICD-10-CM | POA: Diagnosis present

## 2018-11-13 DIAGNOSIS — E1169 Type 2 diabetes mellitus with other specified complication: Secondary | ICD-10-CM | POA: Diagnosis present

## 2018-11-13 DIAGNOSIS — Z89512 Acquired absence of left leg below knee: Secondary | ICD-10-CM | POA: Diagnosis not present

## 2018-11-13 DIAGNOSIS — Z811 Family history of alcohol abuse and dependence: Secondary | ICD-10-CM | POA: Diagnosis not present

## 2018-11-13 DIAGNOSIS — M159 Polyosteoarthritis, unspecified: Secondary | ICD-10-CM | POA: Diagnosis present

## 2018-11-13 DIAGNOSIS — Z7901 Long term (current) use of anticoagulants: Secondary | ICD-10-CM | POA: Diagnosis not present

## 2018-11-13 DIAGNOSIS — E1010 Type 1 diabetes mellitus with ketoacidosis without coma: Secondary | ICD-10-CM | POA: Diagnosis not present

## 2018-11-13 LAB — BASIC METABOLIC PANEL
Anion gap: 9 (ref 5–15)
BUN: 19 mg/dL (ref 8–23)
CO2: 22 mmol/L (ref 22–32)
Calcium: 8.3 mg/dL — ABNORMAL LOW (ref 8.9–10.3)
Chloride: 106 mmol/L (ref 98–111)
Creatinine, Ser: 0.92 mg/dL (ref 0.61–1.24)
GFR calc Af Amer: >60 mL/min (ref >60)
GFR calc non Af Amer: >60 mL/min (ref >60)
Glucose, Bld: 218 mg/dL — ABNORMAL HIGH (ref 70–99)
Potassium: 3.6 mmol/L (ref 3.5–5.1)
Sodium: 137 mmol/L (ref 135–145)

## 2018-11-13 LAB — GLUCOSE, CAPILLARY
Glucose-Capillary: 231 mg/dL — ABNORMAL HIGH (ref 70–99)
Glucose-Capillary: 224 mg/dL — ABNORMAL HIGH (ref 70–99)
Glucose-Capillary: 234 mg/dL — ABNORMAL HIGH (ref 70–99)
Glucose-Capillary: 216 mg/dL — ABNORMAL HIGH (ref 70–99)

## 2018-11-13 MED ORDER — METOCLOPRAMIDE HCL 5 MG/ML IJ SOLN: 10.0000 mg | Freq: Three times a day (TID) | INTRAMUSCULAR | Status: DC | PRN

## 2018-11-13 MED ORDER — METFORMIN HCL ER 500 MG PO TB24
500.0000 mg | ORAL_TABLET | Freq: Two times a day (BID) | ORAL | Status: DC
  Administered 2018-11-13 – 2018-11-14 (×3): 500 mg via ORAL
  Filled 2018-11-13 (×4): qty 1

## 2018-11-13 MED ORDER — INSULIN GLARGINE 100 UNIT/ML ~~LOC~~ SOLN
30.0000 [IU] | Freq: Every day | SUBCUTANEOUS | Status: DC
  Administered 2018-11-13 – 2018-11-14 (×2): 30 [IU] via SUBCUTANEOUS
  Filled 2018-11-13 (×2): qty 0.3

## 2018-11-13 MED ORDER — LIVING WELL WITH DIABETES BOOK
Freq: Once | Status: AC
  Administered 2018-11-13: 17:00:00
  Filled 2018-11-13: qty 1

## 2018-11-13 MED ORDER — METOCLOPRAMIDE HCL 10 MG PO TABS
10.0000 mg | ORAL_TABLET | Freq: Four times a day (QID) | ORAL | Status: DC
  Administered 2018-11-13 – 2018-11-14 (×5): 10 mg via ORAL
  Filled 2018-11-13 (×5): qty 1

## 2018-11-13 NOTE — Progress Notes (Signed)
Inpatient Diabetes Program Recommendations  AACE/ADA: New Consensus Statement on Inpatient Glycemic Control (2015)  Target Ranges:  Prepandial:   less than 140 mg/dL      Peak postprandial:   less than 180 mg/dL (1-2 hours)      Critically ill patients:  140 - 180 mg/dL   Lab Results  Component Value Date   GLUCAP 224 (H) 11/13/2018   HGBA1C 10.4 (H) 11/12/2018    Review of Glycemic Control  Diabetes history: DM2 Outpatient Diabetes medications: Basaglar 40 QD + Humalog 5-18 TID per SSI + Metformin 500 BID Current orders for Inpatient glycemic control: Lantus 30 units + Novolog resistant correction tid + hs 0-5 units + Metformin 500 mg bid  Inpatient Diabetes Program Recommendations:   Spoke with patient regarding A1c increased from 8.7 08/15/18 to 10.4 currently. Patient had been sick @ home and not taken his insulin or checked CBGs during this time. Reviewed with patient sick day rules including checking his CBGs more often and keeping hydrated as much as possible and taking basal insulin as needed. Patient states he has also been drinking sweet tea when he goes out to get food and reviewed to limit sugary drinks and sweets. Patient agrees to plan of limiting these dietary items. Patient said Metformin makes him a little nauseated but has continued to take.  Will follow during hospitalization.  Thank you, Samuel [name]. Icie Kuznicki, RN, MSN, CDE  Diabetes Coordinator Inpatient Glycemic Control Team Team Pager [phone] (8am-5pm) 11/13/2018 3:28 PM

## 2018-11-13 NOTE — Progress Notes (Signed)
PROGRESS NOTE    Samuel [name]   ZPH:[id number]  DOB: May 13, 1951  DOA: 11/12/2018 PCP: [name], MD   Brief Narrative:  Samuel [name] is a 68 y.o. male with medical history significant of DM with gastroparesis; PE (2012), on Eliquis; PVD s/p L BKA; OSA not on CPAP; COPD not on home O2; CAD; HTN; and HLD presenting with n/v/d.  "Gastroparesis real bad x the last 2 days."  H/o simliar presentations.  He has not had insulin in 2 days.  No hematemesis.    Subjective: He has tried to eat solid food today but cannot eat and continues to feel very nauseated. He has been having diarrhea at home and has had more diarrhea in the hospital. He complains of mid abdominal pain.     Assessment & Plan:   Principal Problem:   DKA (diabetic ketoacidoses) (HCC) No longer in DKA- he has been transitioned from an insulin infusion to s/c insulin-   Active Problems: Diabetic gastroparesis with vomiting, abdominal pain and diarrhea - he continues to have symptoms and is not able to tolerate solid food.  - cont IVF - cont Reglan and Phenergan    OBSTRUCTIVE SLEEP APNEA - CPAP at bedtime    Pulmonary embolism  - cont Eliquis    Essential hypertension - cont Lopressor    Hyperlipidemia  - cont Lipitor    Time spent in minutes: 35  DVT prophylaxis: Eliquis  Code Status: DNR Family Communication:  Disposition Plan: d/c home when able to tolerate food Consultants:   none Procedures:   none Antimicrobials:  Anti-infectives (From admission, onward)   None       Objective: Vitals:   11/13/18 0327 11/13/18 0840 11/13/18 0915 11/13/18 1059  BP: (!) 143/74 (!) 157/62 (!) 157/62 (!) 119/51  Pulse: 79 77 73   Resp: 19 (!) 23    Temp: 98.1 F (36.7 C) 97.8 F (36.6 C)  98 F (36.7 C)  TempSrc: Oral Oral  Oral  SpO2: 99% 99%    Weight:      Height:        Intake/Output Summary (Last 24 hours) at 11/13/2018 1503 Last data filed at 11/13/2018 1208 Gross per 24 hour  Intake  1875.68 ml  Output 825 ml  Net 1050.68 ml   Filed Weights   11/12/18 0518  Weight: 113.4 kg    Examination: General exam: Appears comfortable  HEENT: PERRLA, oral mucosa moist, no sclera icterus or thrush Respiratory system: Clear to auscultation. Respiratory effort normal. Cardiovascular system: S1 & S2 heard, RRR.   Gastrointestinal system: Abdomen soft, tender in mid abdomen, nondistended. Normal bowel sounds. Central nervous system: Alert and oriented. No focal neurological deficits. Extremities: No cyanosis, clubbing or edema Skin: small ulcer on right leg - no cellulitis Psychiatry:  Mood & affect appropriate.     Data Reviewed: I have personally reviewed following labs and imaging studies  CBC: Recent Labs  Lab 11/12/18 0542 11/12/18 0550  WBC 10.7*  --   HGB 15.6 13.9  HCT 46.0 41.0  MCV 90.9  --   PLT 282  --    Basic Metabolic Panel: Recent Labs  Lab 11/12/18 0542 11/12/18 0550 11/12/18 0918 11/12/18 1406 11/12/18 1800 11/13/18 0243  NA 135 135 138 139 138 137  K 4.3 4.3 4.4 4.3 3.7 3.6  CL 97*  --  102 106 107 106  CO2 18*  --  18* 20* 23 22  GLUCOSE 444*  --  403*  252* 213* 218*  BUN 21  --  CREATININE 1.21  --  1.24 1.07 0.86 0.92  CALCIUM 9.5  --  8.9 8.9 8.7* 8.3*   GFR: Estimated Creatinine Clearance: 108.8 mL/min (by C-G formula based on SCr of 0.92 mg/dL). Liver Function Tests: Recent Labs  Lab 11/12/18 0542  AST 28  ALT 28  ALKPHOS 86  BILITOT 1.4*  PROT 7.6  ALBUMIN 3.5   Recent Labs  Lab 11/12/18 0918  LIPASE 18   No results for input(s): AMMONIA in the last 168 hours. Coagulation Profile: No results for input(s): INR, PROTIME in the last 168 hours. Cardiac Enzymes: No results for input(s): CKTOTAL, CKMB, CKMBINDEX, TROPONINI in the last 168 hours. BNP (last 3 results) No results for input(s): PROBNP in the last 8760 hours. HbA1C: Recent Labs    11/12/18 0542  HGBA1C 10.4*   CBG: Recent Labs  Lab  11/12/18 1929 11/12/18 2036 11/12/18 2144 11/13/18 0639 11/13/18 1059  GLUCAP 169* 133* 135* 231* 224*   Lipid Profile: No results for input(s): CHOL, HDL, LDLCALC, TRIG, CHOLHDL, LDLDIRECT in the last 72 hours. Thyroid Function Tests: No results for input(s): TSH, T4TOTAL, FREET4, T3FREE, THYROIDAB in the last 72 hours. Anemia Panel: No results for input(s): VITAMINB12, FOLATE, FERRITIN, TIBC, IRON, RETICCTPCT in the last 72 hours. Urine analysis:    Component Value Date/Time   COLORURINE STRAW (A) 06/01/2018 1442   APPEARANCEUR CLEAR 06/01/2018 1442   LABSPEC 1.012 06/01/2018 1442   PHURINE 8.0 06/01/2018 1442   GLUCOSEU 150 (A) 06/01/2018 1442   HGBUR NEGATIVE 06/01/2018 1442   BILIRUBINUR NEGATIVE 06/01/2018 1442   KETONESUR 5 (A) 06/01/2018 1442   PROTEINUR NEGATIVE 06/01/2018 1442   UROBILINOGEN 1.0 04/05/2015 1155   NITRITE NEGATIVE 06/01/2018 1442   LEUKOCYTESUR NEGATIVE 06/01/2018 1442   Sepsis Labs: (procalcitonin:4,lacticidven:4) ) Recent Results (from the past 240 hour(s))  SARS Coronavirus 2 (CEPHEID - Performed in [facility] hospital lab), Hosp Order     Status: None   Collection Time: 11/12/18  6:30 AM  Result Value Ref Range Status   SARS Coronavirus 2 NEGATIVE NEGATIVE Final    Comment: (NOTE) If result is NEGATIVE SARS-CoV-2 target nucleic acids are NOT DETECTED. The SARS-CoV-2 RNA is generally detectable in upper and lower  respiratory specimens during the acute phase of infection. The lowest  concentration of SARS-CoV-2 viral copies this assay can detect is 250  copies / mL. A negative result does not preclude SARS-CoV-2 infection  and should not be used as the sole basis for treatment or other  patient management decisions.  A negative result may occur with  improper specimen collection / handling, submission of specimen other  than nasopharyngeal swab, presence of viral mutation(s) within the  areas targeted by this assay, and  inadequate number of viral copies  (<250 copies / mL). A negative result must be combined with clinical  observations, patient history, and epidemiological information. If result is POSITIVE SARS-CoV-2 target nucleic acids are DETECTED. The SARS-CoV-2 RNA is generally detectable in upper and lower  respiratory specimens dur ing the acute phase of infection.  Positive  results are indicative of active infection with SARS-CoV-2.  Clinical  correlation with patient history and other diagnostic information is  necessary to determine patient infection status.  Positive results do  not rule out bacterial infection or co-infection with other viruses. If result is PRESUMPTIVE POSTIVE SARS-CoV-2 nucleic acids MAY BE PRESENT.   A presumptive positive result  was obtained on the submitted specimen  and confirmed on repeat testing.  While 2019 novel coronavirus  (SARS-CoV-2) nucleic acids may be present in the submitted sample  additional confirmatory testing may be necessary for epidemiological  and / or clinical management purposes  to differentiate between  SARS-CoV-2 and other Sarbecovirus currently known to infect humans.  If clinically indicated additional testing with an alternate test  methodology [phone] is advised. The SARS-CoV-2 RNA is generally  detectable in upper and lower respiratory sp ecimens during the acute  phase of infection. The expected result is Negative. Fact Sheet for Patients:  BoilerBrush.com.cyhttps://www.fda.gov/media/136312/download Fact Sheet for Healthcare Providers: https://pope.com/https://www.fda.gov/media/136313/download This test is not yet approved or cleared by the [place] FDA and has been authorized for detection and/or diagnosis of SARS-CoV-2 by FDA under an Emergency Use Authorization (EUA).  This EUA will remain in effect (meaning this test can be used) for the duration of the COVID-19 declaration under Section 564(b)(1) of the Act, 21 U.S.C. section 360bbb-3(b)(1), unless the  authorization is terminated or revoked sooner. Performed at [facility], [address] [place], [place] [address]   MRSA PCR Screening     Status: None   Collection Time: 11/12/18  9:15 PM  Result Value Ref Range Status   MRSA by PCR NEGATIVE NEGATIVE Final    Comment:        The GeneXpert MRSA Assay (FDA approved for NASAL specimens only), is one component of a comprehensive MRSA colonization surveillance program. It is not intended to diagnose MRSA infection nor to guide or monitor treatment for MRSA infections. Performed at [facility], [name] [place], [place] [address]          Radiology Studies: Dg Chest 1 View  Result Date: 11/12/2018 CLINICAL DATA:  Hypoxia EXAM: CHEST  1 VIEW COMPARISON:  10/01/2018 FINDINGS: Normal heart size and mediastinal contours. There is no edema, consolidation, effusion, or pneumothorax. IMPRESSION: No evidence of active disease. Electronically Signed   By: Marnee SpringJonathon  Watts M.D.   On: 11/12/2018 07:03      Scheduled Meds: . amLODipine  10 mg Oral Daily  . apixaban  5 mg Oral BID  . aspirin  81 mg Oral Daily  . atorvastatin  80 mg Oral q1800  . clopidogrel  75 mg Oral Daily  . gabapentin  300 mg Oral TID  . insulin aspart  0-20 Units Subcutaneous TID WC  . insulin aspart  0-5 Units Subcutaneous QHS  . insulin glargine  30 Units Subcutaneous Daily  . metFORMIN  500 mg Oral BID  . metoCLOPramide  10 mg Oral Q6H  . metoprolol tartrate  25 mg Oral BID  . mupirocin cream   Topical BID  . pantoprazole  40 mg Oral BID AC  . prednisoLONE acetate  1 drop Left Eye QID  . ranolazine  500 mg Oral BID  . sodium chloride flush  10-40 mL Intracatheter Q12H   Continuous Infusions: . sodium chloride 75 mL/hr at 11/12/18 2103     LOS: 0 days      Calvert CantorSaima Konor Noren, MD [facility] Pager: [email or web address] Password [email or web address] 11/13/2018, 3:03 PM

## 2018-11-14 DIAGNOSIS — I2782 Chronic pulmonary embolism: Secondary | ICD-10-CM

## 2018-11-14 LAB — GLUCOSE, CAPILLARY: Glucose-Capillary: 189 mg/dL — ABNORMAL HIGH (ref 70–99)

## 2018-11-14 NOTE — Progress Notes (Signed)
Discharged home  by wheelchair took a cab , discharged instructions given to pt. Belongings taken by pt.

## 2018-11-14 NOTE — Discharge Summary (Signed)
Physician Discharge Summary  Samuel [name] ZOX:[id number] DOB: 03-27-1951 DOA: 11/12/2018  PCP: [name], MD  Admit date: 11/12/2018 Discharge date: 11/14/2018  Admitted From: home Disposition:  home   Recommendations for Outpatient Follow-up:  1. Needs continued GI follow up  Discharge Condition:  stable   CODE STATUS:  DNR   Consultations:  none    Discharge Diagnoses:  Principal Problem:   DKA (diabetic ketoacidoses) (HCC) Active Problems:  Intractable vomiting/ abdominal pain and diarrhea -   Diabetic gastroparesis   OBSTRUCTIVE SLEEP APNEA   H/o Pulmonary embolism (HCC)   Essential hypertension   Hyperlipidemia due to type 2 diabetes mellitus (HCC)   COPD (chronic obstructive pulmonary disease) (HCC)      Brief Summary: Samuel Palmeri Hinsonis a 68 y.o.malewith medical history significant ofDM with gastroparesis; PE (2012), on Eliquis; PVDs/p L BKA; OSAnot on CPAP;COPD not on home O2;CAD; HTN;andHLD presenting with n/v/d."Gastroparesis real bad x the last 2 days." He had a simliar presentation on 10/01/18 and 08/16/18. He has not had insulin in 2 days. No hematemesis.    Hospital Course:   Principal Problem:   DKA (diabetic ketoacidoses) (HCC) - treated with an insulin infusion- he has been transitioned from an insulin infusion to s/c insulin-  I advised him not to stop his long acting insulin if he has and exacerbation of his gastroparesis- the diabetes coordinator and RN reinforced this - the patient voices understanding.  Active Problems: Diabetic gastroparesis with vomiting, abdominal pain and diarrhea - he continued to have symptoms yesterday and was not able to eat or drink much and thus was kept in the hospital overnight for further treatment- his GI symptoms have resolved as of today and he was able to eat a full breakfast without symptoms - cont Reglan, BID Protonix and Phenergan at home    OBSTRUCTIVE SLEEP APNEA - CPAP at bedtime    Pulmonary  embolism  - cont Eliquis    Essential hypertension - cont Lopressor    Hyperlipidemia  - cont Lipitor  Obesity - needs to lose weight Body mass index is 31.25 kg/m.   Discharge Exam: Vitals:   11/14/18 0321 11/14/18 0729  BP: (!) 111/58 (!) 128/51  Pulse: 69 73  Resp: 18 19  Temp: 97.9 F (36.6 C) 98.1 F (36.7 C)  SpO2: 98% 97%   Vitals:   11/13/18 1952 11/13/18 2326 11/14/18 0321 11/14/18 0729  BP: (!) 157/76 (!) 166/77 (!) 111/58 (!) 128/51  Pulse: 76 71 69 73  Resp: Temp: 98.8 F (37.1 C) 98 F (36.7 C) 97.9 F (36.6 C) 98.1 F (36.7 C)  TempSrc: Oral Oral Oral Oral  SpO2: 98% 98% 98% 97%  Weight:      Height:        General: Pt is alert, awake, not in acute distress Cardiovascular: RRR, S1/S2 +, no rubs, no gallops Respiratory: CTA bilaterally, no wheezing, no rhonchi Abdominal: Soft, NT, ND, bowel sounds + Extremities: no edema, no cyanosis   Discharge Instructions  Discharge Instructions    Diet - low sodium heart healthy   Complete by:  As directed    Diet Carb Modified   Complete by:  As directed    Increase activity slowly   Complete by:  As directed      Allergies as of 11/14/2018      Reactions   Gabapentin Hives, Itching, Nausea And Vomiting, Rash   REACTION: rash/hives (per patient, was a reaction to  an inactive ingredient in another mgf brand). The patient stated that he does take this medication now and it doesn't cause a rash any more.   Relafen [nabumetone] Nausea And Vomiting, Rash   Codeine Nausea And Vomiting, Rash      Medication List    TAKE these medications   acarbose 50 MG tablet Commonly known as:  PRECOSE Take 50 mg by mouth 3 (three) times daily with meals.   amLODipine 10 MG tablet Commonly known as:  NORVASC Take 1 tablet (10 mg total) by mouth daily.   apixaban 5 MG Tabs tablet Commonly known as:  ELIQUIS Take 1 tablet (5 mg total) by mouth 2 (two) times daily.   aspirin 81 MG EC  tablet Take 1 tablet (81 mg total) by mouth daily.   atorvastatin 80 MG tablet Commonly known as:  LIPITOR Take 1 tablet (80 mg total) by mouth daily at 6 PM.   Basaglar KwikPen 100 UNIT/ML Sopn Inject 40 Units into the skin daily.   clopidogrel 75 MG tablet Commonly known as:  PLAVIX Take 75 mg by mouth daily.   docusate sodium 100 MG capsule Commonly known as:  COLACE Take 100 mg by mouth daily.   ferrous sulfate 325 (65 FE) MG tablet Take 325 mg by mouth every evening.   furosemide 20 MG tablet Commonly known as:  LASIX Take 1 tablet (20 mg total) by mouth daily.   gabapentin 300 MG capsule Commonly known as:  NEURONTIN Take 300 mg by mouth 3 (three) times daily.   insulin lispro 100 UNIT/ML injection Commonly known as:  HUMALOG Inject 5-18 Units into the skin See admin instructions. units three times daily before meals Sliding Scale 150-250 = 5 units 251-350= 6-8 units 351-450= 10 units Over 450 18 units   metFORMIN 500 MG 24 hr tablet Commonly known as:  GLUCOPHAGE-XR Take 500 mg by mouth 2 (two) times daily.   metoCLOPramide 10 MG tablet Commonly known as:  Reglan Take 1 tablet (10 mg total) by mouth every 6 (six) hours for 30 days. Taper back down to your home dose as your symptoms begin to improve. What changed:    how much to take  when to take this  additional instructions   metoprolol tartrate 25 MG tablet Commonly known as:  LOPRESSOR Take 1 tablet (25 mg total) by mouth 2 (two) times daily.   nitroGLYCERIN 0.4 MG SL tablet Commonly known as:  NITROSTAT Place 1 tablet (0.4 mg total) under the tongue every 5 (five) minutes as needed for chest pain.   OneTouch Verio test strip Generic drug:  glucose blood Use to test blood sugar twice daily   pantoprazole 40 MG tablet Commonly known as:  PROTONIX Take 1 tablet (40 mg total) by mouth 2 (two) times daily before a meal.   prednisoLONE acetate 1 % ophthalmic suspension Commonly known as:   PRED FORTE Place 1 drop into the left eye 4 (four) times daily.   promethazine 25 MG suppository Commonly known as:  PHENERGAN Place 1 suppository (25 mg total) rectally every 6 (six) hours as needed for nausea or vomiting.   promethazine 25 MG tablet Commonly known as:  PHENERGAN Take 25-50 mg by mouth every 6 (six) hours as needed for nausea or vomiting.   ranolazine 500 MG 12 hr tablet Commonly known as:  RANEXA Take 1 tablet (500 mg total) by mouth 2 (two) times daily.   traMADol 50 MG tablet Commonly known as:  ULTRAM Take  100 mg by mouth every 6 (six) hours as needed for moderate pain.       Allergies  Allergen Reactions  . Gabapentin Hives, Itching, Nausea And Vomiting and Rash    REACTION: rash/hives (per patient, was a reaction to an inactive ingredient in another mgf brand). The patient stated that he does take this medication now and it doesn't cause a rash any more.  . Relafen [Nabumetone] Nausea And Vomiting and Rash  . Codeine Nausea And Vomiting and Rash     Procedures/Studies:    Dg Chest 1 View  Result Date: 11/12/2018 CLINICAL DATA:  Hypoxia EXAM: CHEST  1 VIEW COMPARISON:  10/01/2018 FINDINGS: Normal heart size and mediastinal contours. There is no edema, consolidation, effusion, or pneumothorax. IMPRESSION: No evidence of active disease. Electronically Signed   By: Marnee Spring M.D.   On: 11/12/2018 07:03   Ct Head Wo Contrast  Result Date: 10/26/2018 CLINICAL DATA:  68 y/o  M; head trauma and headache. EXAM: CT HEAD WITHOUT CONTRAST TECHNIQUE: Contiguous axial images were obtained from the base of the skull through the vertex without intravenous contrast. COMPARISON:  12/02/2016 CT head FINDINGS: Brain: No evidence of acute infarction, hemorrhage, hydrocephalus, extra-axial collection or mass lesion/mass effect. Stable mild volume loss of the brain. Vascular: Ossific atherosclerosis of the carotid siphons. No hyperdense vessel identified. Skull: Normal.  Negative for fracture or focal lesion. Sinuses/Orbits: Mild mucosal thickening the right maxillary sinus. Additional included paranasal sinuses and the mastoid air cells are otherwise normally aerated. Debris in the left external auditory canal, likely cerumen. Bilateral intra-ocular lens replacement. Other: None. IMPRESSION: 1. No acute intracranial abnormality or calvarial fracture. 2. Stable mild volume loss of the brain. Electronically Signed   By: Mitzi Hansen M.D.   On: 10/26/2018 03:30      The results of significant diagnostics from this hospitalization (including imaging, microbiology, ancillary and laboratory) are listed below for reference.     Microbiology: Recent Results (from the past 240 hour(s))  SARS Coronavirus 2 (CEPHEID - Performed in [facility] hospital lab), Hosp Order     Status: None   Collection Time: 11/12/18  6:30 AM  Result Value Ref Range Status   SARS Coronavirus 2 NEGATIVE NEGATIVE Final    Comment: (NOTE) If result is NEGATIVE SARS-CoV-2 target nucleic acids are NOT DETECTED. The SARS-CoV-2 RNA is generally detectable in upper and lower  respiratory specimens during the acute phase of infection. The lowest  concentration of SARS-CoV-2 viral copies this assay can detect is 250  copies / mL. A negative result does not preclude SARS-CoV-2 infection  and should not be used as the sole basis for treatment or other  patient management decisions.  A negative result may occur with  improper specimen collection / handling, submission of specimen other  than nasopharyngeal swab, presence of viral mutation(s) within the  areas targeted by this assay, and inadequate number of viral copies  (<250 copies / mL). A negative result must be combined with clinical  observations, patient history, and epidemiological information. If result is POSITIVE SARS-CoV-2 target nucleic acids are DETECTED. The SARS-CoV-2 RNA is generally detectable in upper and lower   respiratory specimens dur ing the acute phase of infection.  Positive  results are indicative of active infection with SARS-CoV-2.  Clinical  correlation with patient history and other diagnostic information is  necessary to determine patient infection status.  Positive results do  not rule out bacterial infection or co-infection with other viruses. If  result is PRESUMPTIVE POSTIVE SARS-CoV-2 nucleic acids MAY BE PRESENT.   A presumptive positive result was obtained on the submitted specimen  and confirmed on repeat testing.  While 2019 novel coronavirus  (SARS-CoV-2) nucleic acids may be present in the submitted sample  additional confirmatory testing may be necessary for epidemiological  and / or clinical management purposes  to differentiate between  SARS-CoV-2 and other Sarbecovirus currently known to infect humans.  If clinically indicated additional testing with an alternate test  methodology [phone]) is advised. The SARS-CoV-2 RNA is generally  detectable in upper and lower respiratory sp ecimens during the acute  phase of infection. The expected result is Negative. Fact Sheet for Patients:  BoilerBrush.com.cy Fact Sheet for Healthcare Providers: https://pope.com/ This test is not yet approved or cleared by the [place] FDA and has been authorized for detection and/or diagnosis of SARS-CoV-2 by FDA under an Emergency Use Authorization (EUA).  This EUA will remain in effect (meaning this test can be used) for the duration of the COVID-19 declaration under Section 564(b)(1) of the Act, 21 U.S.C. section 360bbb-3(b)(1), unless the authorization is terminated or revoked sooner. Performed at [facility], [address] [place], [place] [address]   MRSA PCR Screening     Status: None   Collection Time: 11/12/18  9:15 PM  Result Value Ref Range Status   MRSA by PCR NEGATIVE NEGATIVE Final    Comment:        The  GeneXpert MRSA Assay (FDA approved for NASAL specimens only), is one component of a comprehensive MRSA colonization surveillance program. It is not intended to diagnose MRSA infection nor to guide or monitor treatment for MRSA infections. Performed at [facility], [address] [place], [place] [address]      Labs: BNP (last 3 results) No results for input(s): BNP in the last 8760 hours. Basic Metabolic Panel: Recent Labs  Lab 11/12/18 0542 11/12/18 0550 11/12/18 0918 11/12/18 1406 11/12/18 1800 11/13/18 0243  NA 135 135 138 139 138 137  K 4.3 4.3 4.4 4.3 3.7 3.6  CL 97*  --  102 106 107 106  CO2 18*  --  18* 20* 23 22  GLUCOSE 444*  --  403* 252* 213* 218*  BUN 21  --  CREATININE 1.21  --  1.24 1.07 0.86 0.92  CALCIUM 9.5  --  8.9 8.9 8.7* 8.3*   Liver Function Tests: Recent Labs  Lab 11/12/18 0542  AST 28  ALT 28  ALKPHOS 86  BILITOT 1.4*  PROT 7.6  ALBUMIN 3.5   Recent Labs  Lab 11/12/18 0918  LIPASE 18   No results for input(s): AMMONIA in the last 168 hours. CBC: Recent Labs  Lab 11/12/18 0542 11/12/18 0550  WBC 10.7*  --   HGB 15.6 13.9  HCT 46.0 41.0  MCV 90.9  --   PLT 282  --    Cardiac Enzymes: No results for input(s): CKTOTAL, CKMB, CKMBINDEX, TROPONINI in the last 168 hours. BNP: Invalid input(s): POCBNP CBG: Recent Labs  Lab 11/13/18 0639 11/13/18 1059 11/13/18 1620 11/13/18 2108 11/14/18 0546  GLUCAP 231* 224* 234* 216* 189*   D-Dimer No results for input(s): DDIMER in the last 72 hours. Hgb A1c Recent Labs    11/12/18 0542  HGBA1C 10.4*   Lipid Profile No results for input(s): CHOL, HDL, LDLCALC, TRIG, CHOLHDL, LDLDIRECT in the last 72 hours. Thyroid function studies No results for input(s):  TSH, T4TOTAL, T3FREE, THYROIDAB in the last 72 hours.  Invalid input(s): FREET3 Anemia work up No results for input(s): VITAMINB12, FOLATE, FERRITIN, TIBC, IRON, RETICCTPCT in the last 72  hours. Urinalysis    Component Value Date/Time   COLORURINE STRAW (A) 06/01/2018 1442   APPEARANCEUR CLEAR 06/01/2018 1442   LABSPEC 1.012 06/01/2018 1442   PHURINE 8.0 06/01/2018 1442   GLUCOSEU 150 (A) 06/01/2018 1442   HGBUR NEGATIVE 06/01/2018 1442   BILIRUBINUR NEGATIVE 06/01/2018 1442   KETONESUR 5 (A) 06/01/2018 1442   PROTEINUR NEGATIVE 06/01/2018 1442   UROBILINOGEN 1.0 04/05/2015 1155   NITRITE NEGATIVE 06/01/2018 1442   LEUKOCYTESUR NEGATIVE 06/01/2018 1442   Sepsis Labs Invalid input(s): PROCALCITONIN,  WBC,  LACTICIDVEN Microbiology Recent Results (from the past 240 hour(s))  SARS Coronavirus 2 (CEPHEID - Performed in [facility] hospital lab), Hosp Order     Status: None   Collection Time: 11/12/18  6:30 AM  Result Value Ref Range Status   SARS Coronavirus 2 NEGATIVE NEGATIVE Final    Comment: (NOTE) If result is NEGATIVE SARS-CoV-2 target nucleic acids are NOT DETECTED. The SARS-CoV-2 RNA is generally detectable in upper and lower  respiratory specimens during the acute phase of infection. The lowest  concentration of SARS-CoV-2 viral copies this assay can detect is 250  copies / mL. A negative result does not preclude SARS-CoV-2 infection  and should not be used as the sole basis for treatment or other  patient management decisions.  A negative result may occur with  improper specimen collection / handling, submission of specimen other  than nasopharyngeal swab, presence of viral mutation(s) within the  areas targeted by this assay, and inadequate number of viral copies  (<250 copies / mL). A negative result must be combined with clinical  observations, patient history, and epidemiological information. If result is POSITIVE SARS-CoV-2 target nucleic acids are DETECTED. The SARS-CoV-2 RNA is generally detectable in upper and lower  respiratory specimens dur ing the acute phase of infection.  Positive  results are indicative of active infection with  SARS-CoV-2.  Clinical  correlation with patient history and other diagnostic information is  necessary to determine patient infection status.  Positive results do  not rule out bacterial infection or co-infection with other viruses. If result is PRESUMPTIVE POSTIVE SARS-CoV-2 nucleic acids MAY BE PRESENT.   A presumptive positive result was obtained on the submitted specimen  and confirmed on repeat testing.  While 2019 novel coronavirus  (SARS-CoV-2) nucleic acids may be present in the submitted sample  additional confirmatory testing may be necessary for epidemiological  and / or clinical management purposes  to differentiate between  SARS-CoV-2 and other Sarbecovirus currently known to infect humans.  If clinically indicated additional testing with an alternate test  methodology [phone] is advised. The SARS-CoV-2 RNA is generally  detectable in upper and lower respiratory sp ecimens during the acute  phase of infection. The expected result is Negative. Fact Sheet for Patients:  BoilerBrush.com.cyhttps://www.fda.gov/media/136312/download Fact Sheet for Healthcare Providers: https://pope.com/https://www.fda.gov/media/136313/download This test is not yet approved or cleared by the [place] FDA and has been authorized for detection and/or diagnosis of SARS-CoV-2 by FDA under an Emergency Use Authorization (EUA).  This EUA will remain in effect (meaning this test can be used) for the duration of the COVID-19 declaration under Section 564(b)(1) of the Act, 21 U.S.C. section 360bbb-3(b)(1), unless the authorization is terminated or revoked sooner. Performed at [facility], [address] [place], [place] [address]  MRSA PCR Screening     Status: None   Collection Time: 11/12/18  9:15 PM  Result Value Ref Range Status   MRSA by PCR NEGATIVE NEGATIVE Final    Comment:        The GeneXpert MRSA Assay (FDA approved for NASAL specimens only), is one component of a comprehensive MRSA  colonization surveillance program. It is not intended to diagnose MRSA infection nor to guide or monitor treatment for MRSA infections. Performed at [facility], [address] [place], [place] [address]      Time coordinating discharge in minutes: 65  SIGNED:   Calvert Cantor, MD  [facility] 11/14/2018, 8:07 AM Pager   If 7PM-7AM, please contact night-coverage [email or web address] Password [email or web address]

## 2018-11-14 NOTE — Consult Note (Signed)
   [facility] Inpatient Consult   11/14/2018  Samuel [name] [birth year] [id number]  Patient was assessed for [facility] Care Management for community services. Patient was previously active with [facility] Care Management. Community have difficulty maintaining contact with patient with [employer].  Active consent form signed is on file.   Patient already transitioned home at this time.    Of note, [facility] Care Management services does not replace or interfere with any services that are arranged by inpatient case management or social work.   For additional questions or referrals please contact:  [name], RN BSN CCM [facility]  [phone] business mobile phone Toll free office [phone]  Fax number: [phone] [name]

## 2018-12-11 DIAGNOSIS — E1140 Type 2 diabetes mellitus with diabetic neuropathy, unspecified: Secondary | ICD-10-CM | POA: Diagnosis not present

## 2018-12-11 DIAGNOSIS — S88112A Complete traumatic amputation at level between knee and ankle, left lower leg, initial encounter: Secondary | ICD-10-CM | POA: Diagnosis not present

## 2018-12-11 DIAGNOSIS — Z794 Long term (current) use of insulin: Secondary | ICD-10-CM | POA: Diagnosis not present

## 2018-12-11 DIAGNOSIS — I2510 Atherosclerotic heart disease of native coronary artery without angina pectoris: Secondary | ICD-10-CM | POA: Diagnosis not present

## 2018-12-11 DIAGNOSIS — I10 Essential (primary) hypertension: Secondary | ICD-10-CM | POA: Diagnosis not present

## 2018-12-11 DIAGNOSIS — E1165 Type 2 diabetes mellitus with hyperglycemia: Secondary | ICD-10-CM | POA: Diagnosis not present

## 2018-12-15 IMAGING — CR DG CHEST 2V
3 series · 3 of 3 positions shown · non-contrast
Comparison: None.

CLINICAL DATA: Nausea, vomiting and weakness beginning earlier
today.

EXAM:
CHEST - 2 VIEW

[x chest ap]
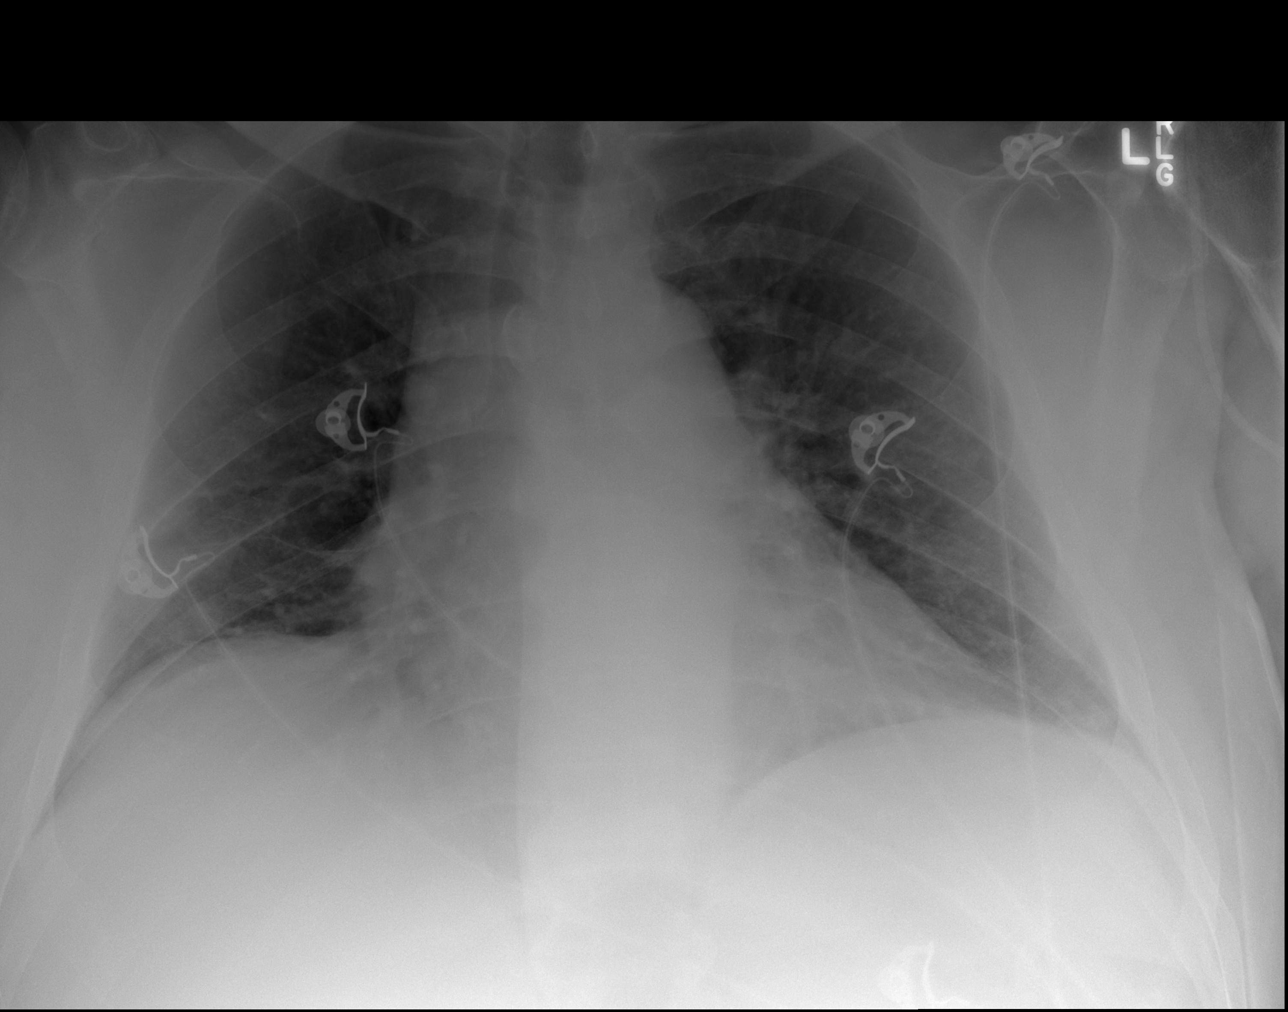

[w chest lat (1 of 2)]
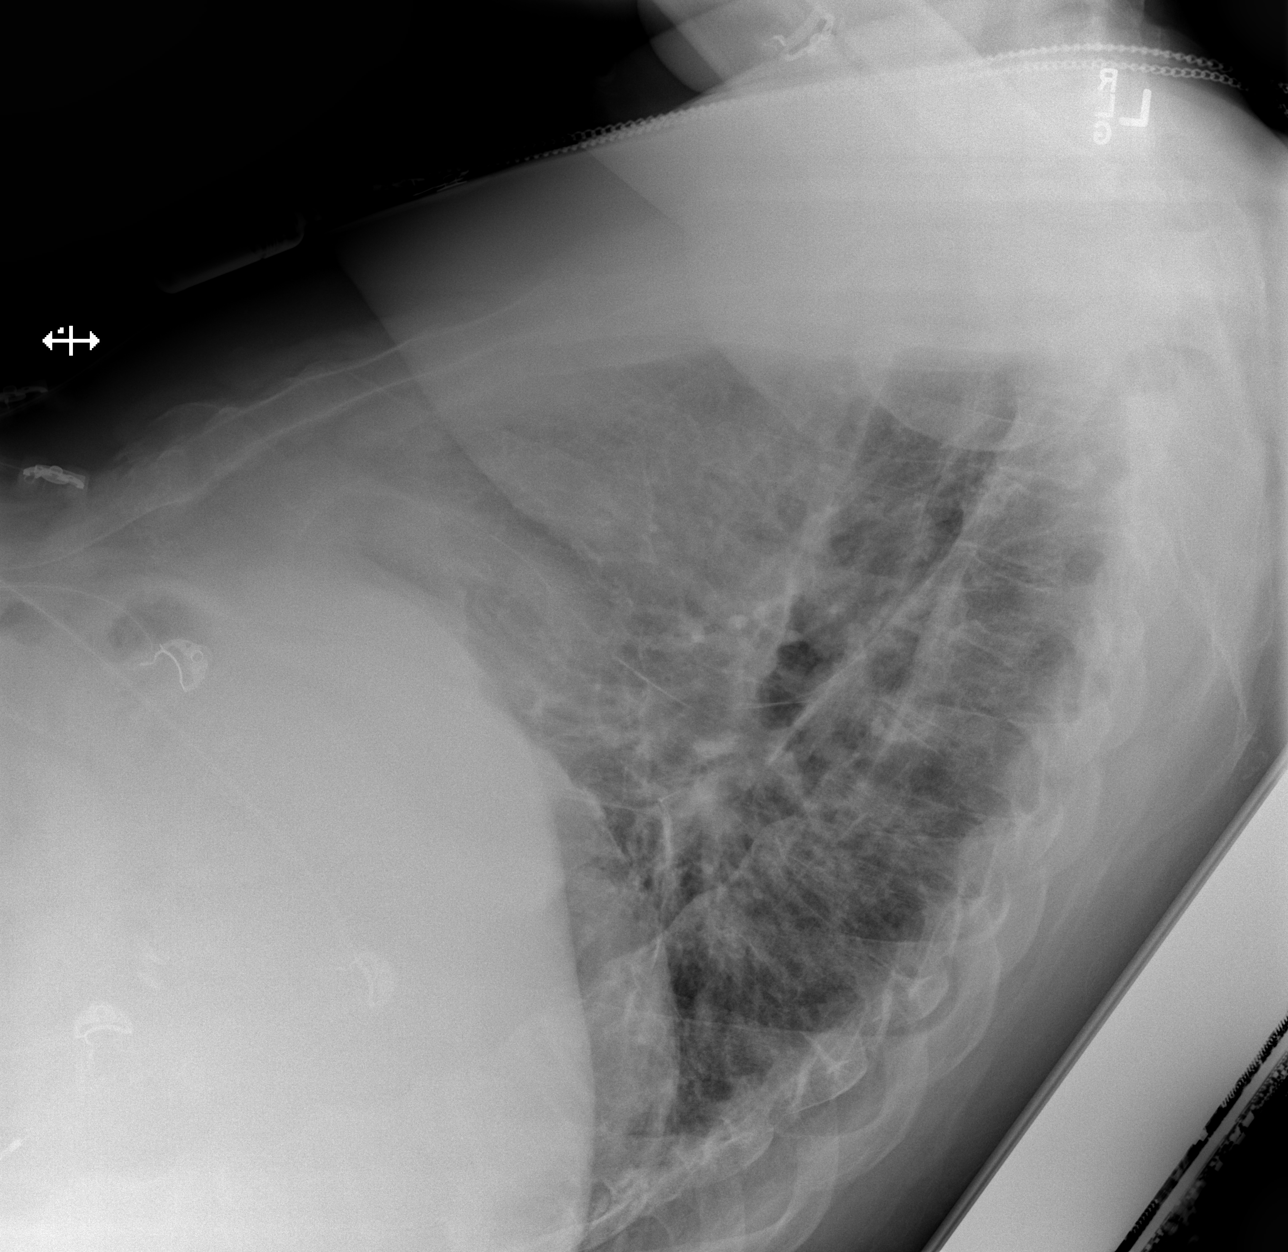

[w chest lat (2 of 2)]
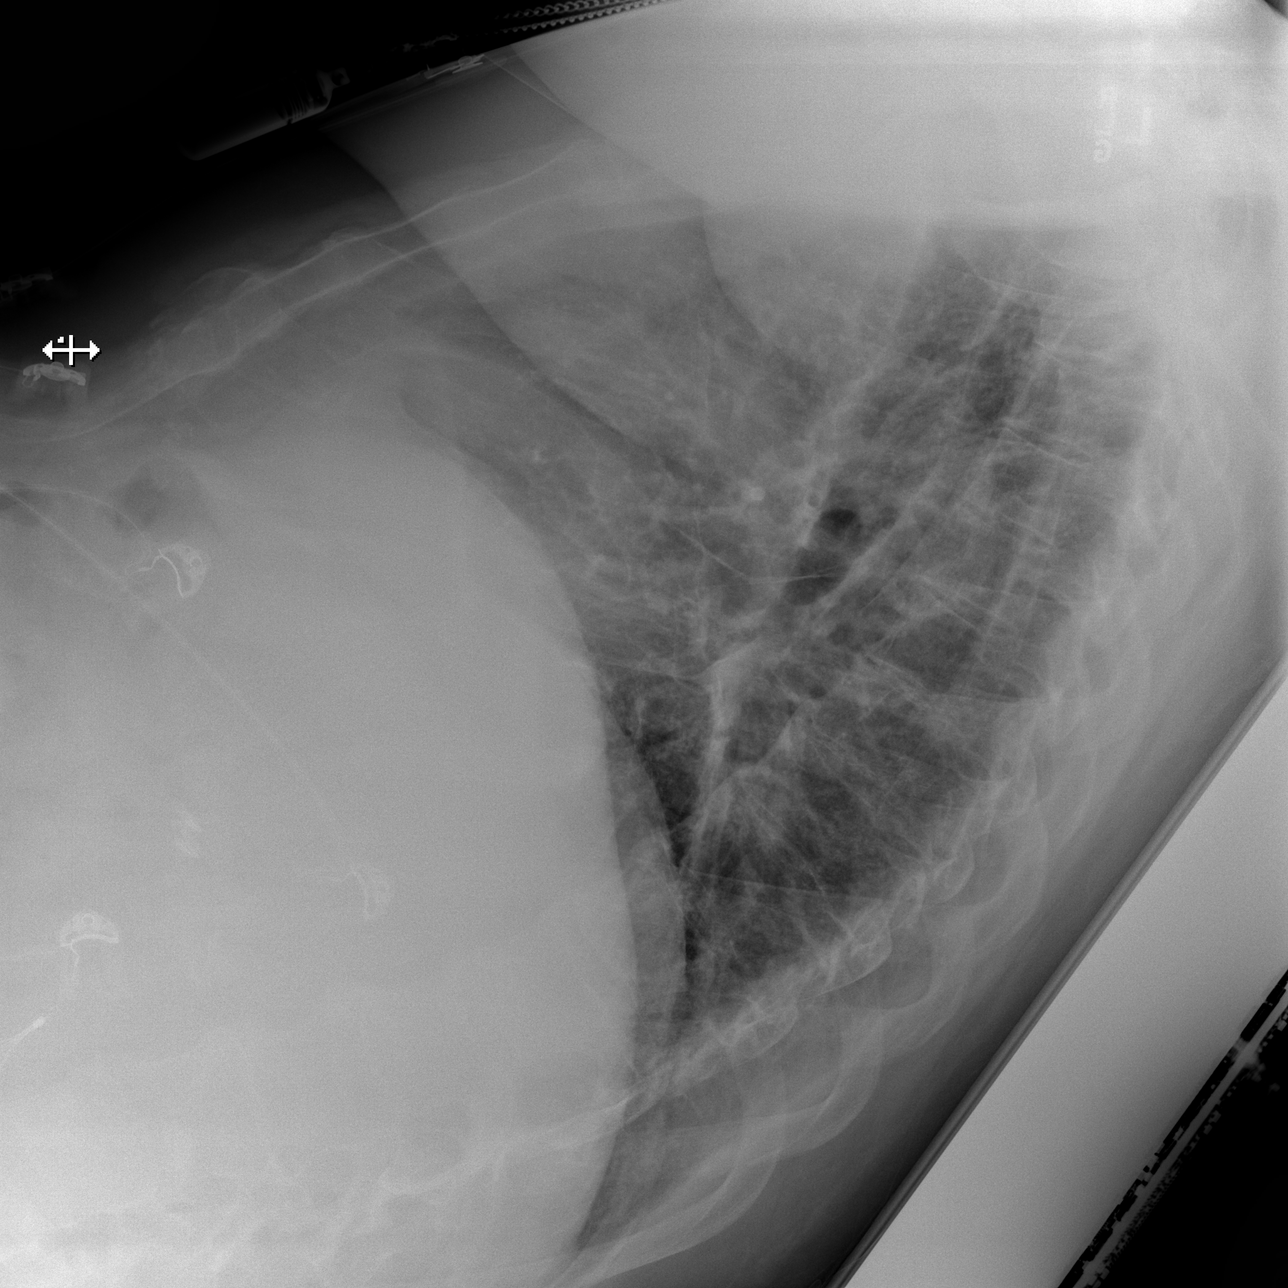

[3 of 3 positions shown; findings below may reference images not displayed]

FINDINGS: The heart is enlarged. Lung volumes are decreased. There may be mild
vascular congestion, but no consolidation or edema. No effusion or
pneumothorax. Bones unremarkable.
IMPRESSION: Low lung volumes with cardiomegaly, and mild vascular congestion. No
consolidation or edema. Similar appearance to priors.

## 2018-12-29 ENCOUNTER — Encounter (HOSPITAL_COMMUNITY): Payer: Self-pay | Admitting: Emergency Medicine

## 2018-12-29 ENCOUNTER — Emergency Department (HOSPITAL_COMMUNITY)
Admission: EM | Admit: 2018-12-29 | Discharge: 2018-12-29 | Disposition: A | Discharge status: Home / Self Care | Payer: PPO | Attending: Emergency Medicine | Admitting: Emergency Medicine

## 2018-12-29 ENCOUNTER — Other Ambulatory Visit: Payer: Self-pay

## 2018-12-29 DIAGNOSIS — Z7901 Long term (current) use of anticoagulants: Secondary | ICD-10-CM | POA: Insufficient documentation

## 2018-12-29 DIAGNOSIS — I259 Chronic ischemic heart disease, unspecified: Secondary | ICD-10-CM | POA: Diagnosis not present

## 2018-12-29 DIAGNOSIS — J449 Chronic obstructive pulmonary disease, unspecified: Secondary | ICD-10-CM | POA: Diagnosis not present

## 2018-12-29 DIAGNOSIS — I10 Essential (primary) hypertension: Secondary | ICD-10-CM | POA: Diagnosis not present

## 2018-12-29 DIAGNOSIS — R112 Nausea with vomiting, unspecified: Secondary | ICD-10-CM | POA: Diagnosis not present

## 2018-12-29 DIAGNOSIS — Z955 Presence of coronary angioplasty implant and graft: Secondary | ICD-10-CM | POA: Insufficient documentation

## 2018-12-29 DIAGNOSIS — Z79899 Other long term (current) drug therapy: Secondary | ICD-10-CM | POA: Insufficient documentation

## 2018-12-29 DIAGNOSIS — E1165 Type 2 diabetes mellitus with hyperglycemia: Secondary | ICD-10-CM | POA: Diagnosis not present

## 2018-12-29 DIAGNOSIS — Z7902 Long term (current) use of antithrombotics/antiplatelets: Secondary | ICD-10-CM | POA: Diagnosis not present

## 2018-12-29 DIAGNOSIS — E119 Type 2 diabetes mellitus without complications: Secondary | ICD-10-CM | POA: Insufficient documentation

## 2018-12-29 DIAGNOSIS — Z794 Long term (current) use of insulin: Secondary | ICD-10-CM | POA: Insufficient documentation

## 2018-12-29 DIAGNOSIS — R1013 Epigastric pain: Secondary | ICD-10-CM | POA: Diagnosis not present

## 2018-12-29 DIAGNOSIS — R110 Nausea: Secondary | ICD-10-CM | POA: Diagnosis not present

## 2018-12-29 DIAGNOSIS — R1111 Vomiting without nausea: Secondary | ICD-10-CM | POA: Diagnosis not present

## 2018-12-29 DIAGNOSIS — R52 Pain, unspecified: Secondary | ICD-10-CM | POA: Diagnosis not present

## 2018-12-29 LAB — COMPREHENSIVE METABOLIC PANEL
ALT: 25 U/L (ref 0–44)
AST: 23 U/L (ref 15–41)
Albumin: 3.3 g/dL — ABNORMAL LOW (ref 3.5–5.0)
Alkaline Phosphatase: 83 U/L (ref 38–126)
Anion gap: 12 (ref 5–15)
BUN: 14 mg/dL (ref 8–23)
CO2: 23 mmol/L (ref 22–32)
Calcium: 9 mg/dL (ref 8.9–10.3)
Chloride: 98 mmol/L (ref 98–111)
Creatinine, Ser: 0.9 mg/dL (ref 0.61–1.24)
GFR calc Af Amer: >60 mL/min (ref >60)
GFR calc non Af Amer: >60 mL/min (ref >60)
Glucose, Bld: 218 mg/dL — ABNORMAL HIGH (ref 70–99)
Potassium: 3.9 mmol/L (ref 3.5–5.1)
Sodium: 133 mmol/L — ABNORMAL LOW (ref 135–145)
Total Bilirubin: 1.2 mg/dL (ref 0.3–1.2)
Total Protein: 7.2 g/dL (ref 6.5–8.1)

## 2018-12-29 LAB — CBC WITH DIFFERENTIAL/PLATELET
Abs Immature Granulocytes: 0.02 10*3/uL (ref 0.00–0.07)
Basophils Absolute: 0 10*3/uL (ref 0.0–0.1)
Basophils Relative: 1 %
Eosinophils Absolute: 0.2 10*3/uL (ref 0.0–0.5)
Eosinophils Relative: 3 %
HCT: 46.4 % (ref 39.0–52.0)
Hemoglobin: 15.7 g/dL (ref 13.0–17.0)
Immature Granulocytes: 0 %
Lymphocytes Relative: 28 %
Lymphs Abs: 1.7 10*3/uL (ref 0.7–4.0)
MCH: 30.8 pg (ref 26.0–34.0)
MCHC: 33.8 g/dL (ref 30.0–36.0)
MCV: 91.2 fL (ref 80.0–100.0)
Monocytes Absolute: 0.5 10*3/uL (ref 0.1–1.0)
Monocytes Relative: 8 %
Neutro Abs: 3.7 10*3/uL (ref 1.7–7.7)
Neutrophils Relative %: 60 %
Platelets: 233 10*3/uL (ref 150–400)
RBC: 5.09 MIL/uL (ref 4.22–5.81)
RDW: 12.4 % (ref 11.5–15.5)
WBC: 6.2 10*3/uL (ref 4.0–10.5)
nRBC: 0 % (ref 0.0–0.2)

## 2018-12-29 LAB — URINALYSIS, ROUTINE W REFLEX MICROSCOPIC
Bacteria, UA: NONE SEEN
Bilirubin Urine: NEGATIVE
Glucose, UA: >=500 mg/dL — AB
Ketones, ur: 5 mg/dL — AB
Leukocytes,Ua: NEGATIVE
Nitrite: NEGATIVE
Protein, ur: 100 mg/dL — AB
Specific Gravity, Urine: 1.023 (ref 1.005–1.030)
pH: 5 (ref 5.0–8.0)

## 2018-12-29 LAB — LIPASE, BLOOD: Lipase: 20 U/L (ref 11–51)

## 2018-12-29 MED ORDER — SODIUM CHLORIDE 0.9 % IV BOLUS
1000.0000 mL | Freq: Once | INTRAVENOUS | Status: AC
  Administered 2018-12-29: 1000 mL via INTRAVENOUS

## 2018-12-29 MED ORDER — PROMETHAZINE HCL 25 MG/ML IJ SOLN
12.5000 mg | Freq: Once | INTRAMUSCULAR | Status: AC
  Administered 2018-12-29: 12.5 mg via INTRAVENOUS
  Filled 2018-12-29: qty 1

## 2018-12-29 NOTE — ED Notes (Signed)
Pt with desats to 70s on RA. Pt with hx of sleep apnea. Repositioned patient and placed on [place] @ 2L. Sats returned to mid 90s.

## 2018-12-29 NOTE — ED Provider Notes (Signed)
[facility]   CSN: [id number] Arrival date & time: 12/29/18  0805  History   Chief Complaint Chief Complaint  Patient presents with   Emesis   HPI Samuel [name] is a 68 y.o. male.  HPI   67yM with n/v. Hx of gastroparesis and attributes current symptoms to this. Began several days ago and significantly worse since last night. Denies any pain. No fever. No diarrhea. Reports compliance with medications. Has been taking phenergan w/ little improvement of symptoms.   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 of 2012   Shortness of breath    exertion   Type II diabetes mellitus (HCC)     Patient Active Problem List   Diagnosis Date Noted   DKA (diabetic ketoacidoses) (HCC) 10/01/2018   Gastroparesis 08/17/2018   Chest pain 08/16/2018   DKA, type 2 (HCC) 02/03/2018   DOE (dyspnea on exertion) 01/24/2018   Uncontrolled diabetes mellitus (HCC) 09/23/2017   DKA, type 2, not at goal [facility](HCC) 09/22/2017   S/P BKA (below knee amputation) (HCC) 04/19/2017   COPD (chronic obstructive pulmonary disease) (HCC) 04/19/2017   NSTEMI (non-ST elevated myocardial infarction) (HCC) 04/07/2017   Osteomyelitis of foot (HCC)    Protein-calorie malnutrition, severe (HCC) 03/08/2015   Osteomyelitis of foot, acute (HCC) 03/07/2015   Diabetes mellitus type 2,  uncontrolled (HCC) 03/07/2015   Normocytic normochromic anemia 03/07/2015   Obesity (BMI 30-39.9) 03/07/2015   Nausea & vomiting    DM (diabetes mellitus), type 2 (HCC) 01/07/2014   Chronic osteomyelitis involving lower leg (HCC) 01/07/2014   Vomiting 12/22/2013   Diabetic gastroparesis (HCC) 12/21/2013   Intractable nausea and vomiting 12/21/2013   Essential hypertension 05/01/2013   Hyperlipidemia due to type 2 diabetes mellitus (HCC) 05/01/2013   Generalized weakness 03/20/2013   Back pain 03/20/2013   Pulmonary embolism (HCC) 03/27/2011   PERFORATION OF GALLBLADDER 08/30/2010   Ulcer of lower limb, unspecified 03/01/2010   METHICILLIN SUSCEPTIBLE STAPH AUREUS SEPTICEMIA 01/25/2010   Acute osteomyelitis, ankle and foot 01/25/2010   NAUSEA AND VOMITING 01/25/2010   GERD 11/09/2009   CAD S/P DES PCI to mLAD: Xience DES 2.75 x 15 (3.0 mm) 05/01/2009   DIABETES, TYPE 1 05/14/2008   OBSTRUCTIVE SLEEP APNEA 04/30/2008   ALLERGIC RHINITIS, SEASONAL 04/30/2008    Past Surgical History:  Procedure Laterality Date   AMPUTATION Left 03/08/2015   Procedure:  LEFT AMPUTATION BELOW KNEE;  Surgeon: Toni ArthursJohn Hewitt, MD;  Location: [facility] ORS;  Service: Orthopedics;  Laterality: Left;   APPLICATION OF WOUND VAC Left 01/07/2014   CHOLECYSTECTOMY  06/2010   CORONARY ANGIOPLASTY WITH STENT PLACEMENT  05/2009   "1"   FOOT SURGERY Left 2010   "for Charcot's joint"   HARDWARE REMOVAL Left 01/07/2014  Procedure: LEFT LEG REMOVAL OF DEEP IMPLANT AND SEQUESTRECTOMY; APPLICATION OF WOUND VAC ;  Surgeon: Toni ArthursJohn Hewitt, MD;  Location: [facility] OR;  Service: Orthopedics;  Laterality: Left;   I&D EXTREMITY Left "multiple"   leg   I&D EXTREMITY Left 01/07/2014   Procedure: 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;  Surgeon: Toni ArthursJohn Hewitt, MD;  Location: [facility] OR;  Service: Orthopedics;  Laterality: Left;   IM NAILING TIBIA Left ~ 2012   LEFT HEART CATH AND CORONARY ANGIOGRAPHY N/A  04/08/2017   Procedure: LEFT HEART CATH AND CORONARY ANGIOGRAPHY;  Surgeon: Runell GessBerry, Jonathan J, MD;  Location: [facility] INVASIVE CV LAB;  Service: Cardiovascular;  Laterality: N/A;   SHOULDER ARTHROSCOPY W/ ROTATOR CUFF REPAIR Left    "and bone spurs"   TIBIAL IM ROD REMOVAL Left 01/07/2014   TOE AMPUTATION Right ~ 2011   "great toe"   VENA CAVA FILTER PLACEMENT  2012   VENA CAVA FILTER PLACEMENT N/A 11/20/2016   Procedure: INSERTION VENA-CAVA FILTER;  Surgeon: Sherren KernsFields, Charles E, MD;  Location: [facility] OR;  Service: Vascular;  Laterality: N/A;   WOUND DEBRIDEMENT Left 01/07/2014   "tibia"     Home Medications    Prior to Admission medications   Medication Sig Start Date End Date Taking? Authorizing Provider  acarbose (PRECOSE) 50 MG tablet Take 50 mg by mouth 3 (three) times daily with meals.    [provider]  amLODipine (NORVASC) 10 MG tablet Take 1 tablet (10 mg total) by mouth daily. 11/22/16   Richarda OverlieAbrol, Nayana, MD  apixaban (ELIQUIS) 5 MG TABS tablet Take 1 tablet (5 mg total) by mouth 2 (two) times daily. 09/24/17   Osvaldo ShipperKrishnan, Gokul, MD  aspirin 81 MG EC tablet Take 1 tablet (81 mg total) by mouth daily. 04/12/17   Glade LloydAlekh, Kshitiz, MD  atorvastatin (LIPITOR) 80 MG tablet Take 1 tablet (80 mg total) by mouth daily at 6 PM. 04/11/17   Glade LloydAlekh, Kshitiz, MD  clopidogrel (PLAVIX) 75 MG tablet Take 75 mg by mouth daily.    [provider]  docusate sodium (COLACE) 100 MG capsule Take 100 mg by mouth daily.    [provider]  ferrous sulfate 325 (65 FE) MG tablet Take 325 mg by mouth every evening.     [provider]  furosemide (LASIX) 20 MG tablet Take 1 tablet (20 mg total) by mouth daily. 04/12/17   Glade LloydAlekh, Kshitiz, MD  gabapentin (NEURONTIN) 300 MG capsule Take 300 mg by mouth 3 (three) times daily.     [provider]  Insulin Glargine (BASAGLAR KWIKPEN) 100 UNIT/ML SOPN Inject 40 Units into the skin daily.    Averneni, Carmon SailsMadhavi, MD  insulin lispro (HUMALOG)  100 UNIT/ML injection Inject 5-18 Units into the skin See admin instructions. units three times daily before meals Sliding Scale 150-250 = 5 units 251-350= 6-8 units 351-450= 10 units Over 450 18 units    Averneni, Madhavi, MD  metFORMIN (GLUCOPHAGE-XR) 500 MG 24 hr tablet Take 500 mg by mouth 2 (two) times daily.     [provider]  metoCLOPramide (REGLAN) 10 MG tablet Take 1 tablet (10 mg total) by mouth every 6 (six) hours for 30 days. Taper back down to your home dose as your symptoms begin to improve. Patient taking differently: Take 5 mg by mouth 3 (three) times daily.  08/21/18 11/12/18  Edsel PetrinMikhail, Maryann, DO  metoprolol tartrate (LOPRESSOR) 25 MG tablet Take 1 tablet (25 mg total) by mouth 2 (two) times daily. 04/11/17  Glade LloydAlekh, Kshitiz, MD  nitroGLYCERIN (NITROSTAT) 0.4 MG SL tablet Place 1 tablet (0.4 mg total) under the tongue every 5 (five) minutes as needed for chest pain. 04/11/17   Glade LloydAlekh, Kshitiz, MD  [facility] test strip Use to test blood sugar twice daily 09/24/17   [provider]  pantoprazole (PROTONIX) 40 MG tablet Take 1 tablet (40 mg total) by mouth 2 (two) times daily before a meal. 08/21/18   Mikhail, Nita SellsMaryann, DO  prednisoLONE acetate (PRED FORTE) 1 % ophthalmic suspension Place 1 drop into the left eye 4 (four) times daily.  07/21/18   [provider]  promethazine (PHENERGAN) 25 MG suppository Place 1 suppository (25 mg total) rectally every 6 (six) hours as needed for nausea or vomiting. 04/18/18   Long, Arlyss RepressJoshua G, MD  promethazine (PHENERGAN) 25 MG tablet Take 25-50 mg by mouth every 6 (six) hours as needed for nausea or vomiting.  05/05/18   [provider]  ranolazine (RANEXA) 500 MG 12 hr tablet Take 1 tablet (500 mg total) by mouth 2 (two) times daily. 04/11/17   Glade LloydAlekh, Kshitiz, MD  traMADol (ULTRAM) 50 MG tablet Take 100 mg by mouth every 6 (six) hours as needed for moderate pain.     [provider]    Family History Family  History  Problem Relation Age of Onset   COPD Mother    Heart disease Mother    Lung cancer Mother    Retinal detachment Father    Alcoholism Brother    Heart disease Brother    COPD Brother    Diabetes Brother    Hypertension Brother    Stroke Brother     Social History Social History   Tobacco Use   Smoking status: Never Smoker   Smokeless tobacco: Never Used  Substance Use Topics   Alcohol use: No    Frequency: Never    Comment: 01/07/2014 'might have a wine cooler a couple times/yr"   Drug use: No     Allergies   Gabapentin, Relafen [nabumetone], and Codeine   Review of Systems Review of Systems  All systems reviewed and negative, other than as noted in HPI.  Physical Exam Updated Vital Signs BP (!) 145/64    Pulse 81    Temp 98.9 F (37.2 C) (Oral)    Resp (!) 9    SpO2 100%   Physical Exam Vitals signs and nursing note reviewed.  Constitutional:      General: He is not in acute distress.    Appearance: He is well-developed.  HENT:     Head: Normocephalic and atraumatic.  Eyes:     General:        Right eye: No discharge.        Left eye: No discharge.     Conjunctiva/sclera: Conjunctivae normal.  Neck:     Musculoskeletal: Neck supple.  Cardiovascular:     Rate and Rhythm: Normal rate and regular rhythm.     Heart sounds: Normal heart sounds. No murmur. No friction rub. No gallop.   Pulmonary:     Effort: Pulmonary effort is normal. No respiratory distress.     Breath sounds: Normal breath sounds.  Abdominal:     General: There is no distension.     Palpations: Abdomen is soft.     Tenderness: There is abdominal tenderness.     Comments: Tenderness to palpation periumbilical to epigastric.  No rebound or guarding.  No distention.  Musculoskeletal:  General: No tenderness.  Skin:    General: Skin is warm and dry.  Neurological:     Mental Status: He is alert.  Psychiatric:        Behavior: Behavior normal.         Thought Content: Thought content normal.    ED Treatments / Results  Labs (all labs ordered are listed, but only abnormal results are displayed) Labs Reviewed  COMPREHENSIVE METABOLIC PANEL - Abnormal; Notable for the following components:      Result Value   Sodium 133 (*)    Glucose, Bld 218 (*)    Albumin 3.3 (*)    All other components within normal limits  URINALYSIS, ROUTINE W REFLEX MICROSCOPIC - Abnormal; Notable for the following components:   Glucose, UA >=500 (*)    Hgb urine dipstick SMALL (*)    Ketones, ur 5 (*)    Protein, ur 100 (*)    All other components within normal limits  CBC WITH DIFFERENTIAL/PLATELET  LIPASE, BLOOD    EKG None  Radiology No results found.  Procedures Procedures (including critical care time)  Medications Ordered in ED Medications  sodium chloride 0.9 % bolus 1,000 mL (0 mLs Intravenous Stopped 12/29/18 1121)  promethazine (PHENERGAN) injection 12.5 mg (12.5 mg Intravenous Given 12/29/18 0907)     Initial Impression / Assessment and Plan / ED Course  I have reviewed the triage vital signs and the nursing notes.  Pertinent labs & imaging results that were available during my care of the patient were reviewed by me and considered in my medical decision making (see chart for details).  68 year old male with primarily nausea vomiting but some abdominal pain.  History of gastroparesis.  States that current symptoms feel similar.  He is treated symptomatically with much improvement.  He would like to go home.  I think this medically appropriate at this time.  I doubt emergent alternative etiology of symptoms such as bowel obstruction, acute surgical process, etc.  Plan continued symptomatic treatment.  Return precautions discussed.  Final Clinical Impressions(s) / ED Diagnoses   Final diagnoses:  Intractable vomiting with nausea, unspecified vomiting type    ED Discharge Orders    None       Virgel Manifold, MD 01/02/19 1119

## 2018-12-29 NOTE — ED Triage Notes (Signed)
n /v x 5 days got worse last night and today has hx of gastroparesis  Given IM zofran  Per EMS

## 2018-12-29 NOTE — ED Notes (Signed)
Patient verbalizes understanding of discharge instructions. Opportunity for questioning and answers were provided. Armband removed by staff, pt discharged from ED.  

## 2019-01-06 DIAGNOSIS — K3184 Gastroparesis: Secondary | ICD-10-CM | POA: Diagnosis not present

## 2019-01-24 NOTE — Progress Notes (Signed)
This encounter was created in error - please disregard.

## 2019-01-26 NOTE — Progress Notes (Signed)
This encounter was created in error - please disregard.  This encounter was created in error - please disregard.

## 2019-02-15 ENCOUNTER — Encounter (HOSPITAL_COMMUNITY): Payer: Self-pay

## 2019-02-15 ENCOUNTER — Emergency Department (HOSPITAL_COMMUNITY)
Admission: EM | Admit: 2019-02-15 | Discharge: 2019-02-15 | Disposition: A | Discharge status: Home / Self Care | Payer: PPO | Attending: Emergency Medicine | Admitting: Emergency Medicine

## 2019-02-15 ENCOUNTER — Other Ambulatory Visit: Payer: Self-pay

## 2019-02-15 DIAGNOSIS — Z955 Presence of coronary angioplasty implant and graft: Secondary | ICD-10-CM | POA: Diagnosis not present

## 2019-02-15 DIAGNOSIS — I259 Chronic ischemic heart disease, unspecified: Secondary | ICD-10-CM | POA: Insufficient documentation

## 2019-02-15 DIAGNOSIS — J449 Chronic obstructive pulmonary disease, unspecified: Secondary | ICD-10-CM | POA: Insufficient documentation

## 2019-02-15 DIAGNOSIS — Z79899 Other long term (current) drug therapy: Secondary | ICD-10-CM | POA: Diagnosis not present

## 2019-02-15 DIAGNOSIS — Z7902 Long term (current) use of antithrombotics/antiplatelets: Secondary | ICD-10-CM | POA: Diagnosis not present

## 2019-02-15 DIAGNOSIS — R112 Nausea with vomiting, unspecified: Secondary | ICD-10-CM | POA: Diagnosis not present

## 2019-02-15 DIAGNOSIS — Z794 Long term (current) use of insulin: Secondary | ICD-10-CM | POA: Diagnosis not present

## 2019-02-15 DIAGNOSIS — R1111 Vomiting without nausea: Secondary | ICD-10-CM | POA: Diagnosis not present

## 2019-02-15 DIAGNOSIS — I2699 Other pulmonary embolism without acute cor pulmonale: Secondary | ICD-10-CM | POA: Diagnosis not present

## 2019-02-15 DIAGNOSIS — I10 Essential (primary) hypertension: Secondary | ICD-10-CM | POA: Diagnosis not present

## 2019-02-15 DIAGNOSIS — R110 Nausea: Secondary | ICD-10-CM | POA: Diagnosis not present

## 2019-02-15 DIAGNOSIS — Z7982 Long term (current) use of aspirin: Secondary | ICD-10-CM | POA: Diagnosis not present

## 2019-02-15 DIAGNOSIS — E119 Type 2 diabetes mellitus without complications: Secondary | ICD-10-CM | POA: Diagnosis not present

## 2019-02-15 DIAGNOSIS — Z7901 Long term (current) use of anticoagulants: Secondary | ICD-10-CM | POA: Diagnosis not present

## 2019-02-15 DIAGNOSIS — K3184 Gastroparesis: Secondary | ICD-10-CM | POA: Diagnosis not present

## 2019-02-15 LAB — CBC WITH DIFFERENTIAL/PLATELET
Abs Immature Granulocytes: 0.03 10*3/uL (ref 0.00–0.07)
Basophils Absolute: 0 10*3/uL (ref 0.0–0.1)
Basophils Relative: 1 %
Eosinophils Absolute: 0.1 10*3/uL (ref 0.0–0.5)
Eosinophils Relative: 1 %
HCT: 43.1 % (ref 39.0–52.0)
Hemoglobin: 14.8 g/dL (ref 13.0–17.0)
Immature Granulocytes: 0 %
Lymphocytes Relative: 17 %
Lymphs Abs: 1.3 10*3/uL (ref 0.7–4.0)
MCH: 30.8 pg (ref 26.0–34.0)
MCHC: 34.3 g/dL (ref 30.0–36.0)
MCV: 89.8 fL (ref 80.0–100.0)
Monocytes Absolute: 0.5 10*3/uL (ref 0.1–1.0)
Monocytes Relative: 6 %
Neutro Abs: 6 10*3/uL (ref 1.7–7.7)
Neutrophils Relative %: 75 %
Platelets: 230 10*3/uL (ref 150–400)
RBC: 4.8 MIL/uL (ref 4.22–5.81)
RDW: 12.2 % (ref 11.5–15.5)
WBC: 7.9 10*3/uL (ref 4.0–10.5)
nRBC: 0 % (ref 0.0–0.2)

## 2019-02-15 LAB — LIPASE, BLOOD: Lipase: 16 U/L (ref 11–51)

## 2019-02-15 LAB — COMPREHENSIVE METABOLIC PANEL
ALT: 33 U/L (ref 0–44)
AST: 34 U/L (ref 15–41)
Albumin: 3.2 g/dL — ABNORMAL LOW (ref 3.5–5.0)
Alkaline Phosphatase: 62 U/L (ref 38–126)
Anion gap: 15 (ref 5–15)
BUN: 10 mg/dL (ref 8–23)
CO2: 23 mmol/L (ref 22–32)
Calcium: 9.3 mg/dL (ref 8.9–10.3)
Chloride: 97 mmol/L — ABNORMAL LOW (ref 98–111)
Creatinine, Ser: 0.85 mg/dL (ref 0.61–1.24)
GFR calc Af Amer: >60 mL/min (ref >60)
GFR calc non Af Amer: >60 mL/min (ref >60)
Glucose, Bld: 189 mg/dL — ABNORMAL HIGH (ref 70–99)
Potassium: 4.1 mmol/L (ref 3.5–5.1)
Sodium: 135 mmol/L (ref 135–145)
Total Bilirubin: 1.1 mg/dL (ref 0.3–1.2)
Total Protein: 6.6 g/dL (ref 6.5–8.1)

## 2019-02-15 MED ORDER — LORAZEPAM 2 MG/ML IJ SOLN
1.0000 mg | Freq: Once | INTRAMUSCULAR | Status: AC
  Administered 2019-02-15: 16:00:00 1 mg via INTRAVENOUS
  Filled 2019-02-15: qty 1

## 2019-02-15 MED ORDER — PROMETHAZINE HCL 25 MG/ML IJ SOLN
12.5000 mg | Freq: Once | INTRAMUSCULAR | Status: AC
  Administered 2019-02-15: 14:00:00 12.5 mg via INTRAVENOUS
  Filled 2019-02-15: qty 1

## 2019-02-15 MED ORDER — FAMOTIDINE IN NACL 20-0.9 MG/50ML-% IV SOLN
20.0000 mg | Freq: Once | INTRAVENOUS | Status: AC
  Administered 2019-02-15: 20 mg via INTRAVENOUS
  Filled 2019-02-15: qty 50

## 2019-02-15 MED ORDER — SODIUM CHLORIDE 0.9 % IV BOLUS
500.0000 mL | Freq: Once | INTRAVENOUS | Status: AC
  Administered 2019-02-15: 500 mL via INTRAVENOUS

## 2019-02-15 MED ORDER — SODIUM CHLORIDE 0.9 % IV BOLUS
500.0000 mL | Freq: Once | INTRAVENOUS | Status: AC
  Administered 2019-02-15: 12:00:00 500 mL via INTRAVENOUS

## 2019-02-15 MED ORDER — PROMETHAZINE HCL 25 MG/ML IJ SOLN
12.5000 mg | Freq: Once | INTRAMUSCULAR | Status: AC
  Administered 2019-02-15: 12.5 mg via INTRAVENOUS
  Filled 2019-02-15: qty 1

## 2019-02-15 NOTE — ED Triage Notes (Signed)
Patient arrived via EMS from home with c/o nausea and vomiting x2 days that has worsened today. Patient tried a phenergan suppository at home with no relief. Received 4mg  of zofran from ems with no relief as well.   Patient has a hx of gastroparesis.

## 2019-02-15 NOTE — ED Notes (Signed)
Pt completed PO challenge with no issues. (had ginger ale).  Patients heart rate is currently 110-115. Updated MD, new orders placed.

## 2019-02-15 NOTE — ED Provider Notes (Signed)
[facility] Provider Note   CSN: [id number] Arrival date & time: 02/15/19  1108     History   Chief Complaint Chief Complaint  Patient presents with  . Nausea  . Emesis    HPI Samuel [name] is a 68 y.o. male.     Patient is a 68 year old male with a history of diabetes and subsequent gastroparesis who presents with nausea vomiting.  He states he has some chronic nausea but his vomiting has gotten worse since yesterday and he has not been able keep anything down.  He has some soreness across his upper abdomen which he says is from the vomiting but no other abdominal pain.  No fevers.  He states is symptoms are typical for his flareups of the gastroparesis.  He is tried Phenergan suppositories at home with no improvement in symptoms.  He was also given Zofran by EMS with no improvement in symptoms.  He is requesting IV Phenergan which typically works for him.  He denies any hematemesis.     Past Medical History:  Diagnosis Date  . Anxiety   . Arthritis    "all over"   . CAD (coronary artery disease)   . Charcot's joint    "left foot"  . 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 Depression   . Gastroparesis   . GERD (gastroesophageal reflux disease)   . H/O hiatal hernia   . Hyperlipidemia   . Hypertension   . Myocardial infarction (HCC) 2017/03/27   around this date  . OSA (obstructive sleep apnea)    "not bad enough for a mask"  . Peripheral neuropathy   . Peripheral vascular disease (HCC)   . PONV (postoperative nausea and vomiting)   . Pulmonary embolism (HCC)    hx. of 2012  . Shortness of breath    exertion  . Type II diabetes mellitus [facility](HCC)     Patient Active Problem List   Diagnosis Date Noted  . DKA (diabetic ketoacidoses) (HCC) 10/01/2018  . Gastroparesis 08/17/2018  . Chest pain 08/16/2018  . DKA, type 2 (HCC) 02/03/2018  . DOE (dyspnea on exertion) 01/24/2018  . Uncontrolled diabetes  mellitus (HCC) 09/23/2017  . DKA, type 2, not at goal [facility](HCC) 09/22/2017  . S/P BKA (below knee amputation) (HCC) 04/19/2017  . COPD (chronic obstructive pulmonary disease) (HCC) 04/19/2017  . NSTEMI (non-ST elevated myocardial infarction) (HCC) 04/07/2017  . Osteomyelitis of foot (HCC)   . Protein-calorie malnutrition, severe (HCC) 03/08/2015  . Osteomyelitis of foot, acute (HCC) 03/07/2015  . Diabetes mellitus type 2, uncontrolled (HCC) 03/07/2015  . Normocytic normochromic anemia 03/07/2015  . Obesity (BMI 30-39.9) 03/07/2015  . Nausea & vomiting   . DM (diabetes mellitus), type 2 (HCC) 01/07/2014  . Chronic osteomyelitis involving lower leg (HCC) 01/07/2014  . Vomiting 12/22/2013  . Diabetic gastroparesis (HCC) 12/21/2013  . Intractable nausea and vomiting 12/21/2013  . Essential hypertension 05/01/2013  . Hyperlipidemia due to type 2 diabetes mellitus (HCC) 05/01/2013  . Generalized weakness 03/20/2013  . Back pain 03/20/2013  . Pulmonary embolism (HCC) 03/27/2011  . PERFORATION OF GALLBLADDER 08/30/2010  . Ulcer of lower limb, unspecified 03/01/2010  . METHICILLIN SUSCEPTIBLE STAPH AUREUS SEPTICEMIA 01/25/2010  . Acute osteomyelitis, ankle and foot 01/25/2010  . NAUSEA AND VOMITING 01/25/2010  . GERD 11/09/2009  . CAD S/P DES PCI to mLAD: Xience DES 2.75 x 15 (3.0 mm) 05/01/2009  . DIABETES, TYPE 1 05/14/2008  . OBSTRUCTIVE SLEEP  APNEA 04/30/2008  . ALLERGIC RHINITIS, SEASONAL 04/30/2008    Past Surgical History:  Procedure Laterality Date  . AMPUTATION Left 03/08/2015   Procedure:  LEFT AMPUTATION BELOW KNEE;  Surgeon: Toni Arthurs, MD;  Location: [facility] ORS;  Service: Orthopedics;  Laterality: Left;  . APPLICATION OF WOUND VAC Left 01/07/2014  . CHOLECYSTECTOMY  06/2010  . CORONARY ANGIOPLASTY WITH STENT PLACEMENT  05/2009   "1"  . FOOT SURGERY Left 2010   "for Charcot's joint"  . HARDWARE REMOVAL Left 01/07/2014   Procedure: LEFT LEG REMOVAL OF DEEP IMPLANT AND  SEQUESTRECTOMY; APPLICATION OF WOUND VAC ;  Surgeon: Toni Arthurs, MD;  Location: [facility] OR;  Service: Orthopedics;  Laterality: Left;  . I&D EXTREMITY Left "multiple"   leg  . 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 IM NAILING TIBIA Left ~ 2012  . LEFT HEART CATH AND CORONARY ANGIOGRAPHY N/A 04/08/2017   Procedure: LEFT HEART CATH AND CORONARY ANGIOGRAPHY;  Surgeon: Runell Gess, MD;  Location: [facility] INVASIVE CV LAB;  Service: Cardiovascular;  Laterality: N/A;  . SHOULDER ARTHROSCOPY W/ ROTATOR CUFF REPAIR Left    "and bone spurs"  . TIBIAL IM ROD REMOVAL Left 01/07/2014  . TOE AMPUTATION Right ~ 2011   "great toe"  . VENA CAVA FILTER PLACEMENT  2012  . VENA CAVA FILTER PLACEMENT N/A 11/20/2016   Procedure: INSERTION VENA-CAVA FILTER;  Surgeon: Sherren Kerns, MD;  Location: [facility] OR;  Service: Vascular;  Laterality: N/A;  . WOUND DEBRIDEMENT Left 01/07/2014   "tibia"        Home Medications    Prior to Admission medications   Medication Sig Start Date End Date Taking? Authorizing Provider  promethazine (PHENERGAN) 25 MG suppository Place 1 suppository (25 mg total) rectally every 6 (six) hours as needed for nausea or vomiting. 04/18/18  Yes Long, Arlyss Repress, MD  acarbose (PRECOSE) 50 MG tablet Take 50 mg by mouth 3 (three) times daily with meals.    [provider]  amLODipine (NORVASC) 10 MG tablet Take 1 tablet (10 mg total) by mouth daily. 11/22/16   Richarda Overlie, MD  apixaban (ELIQUIS) 5 MG TABS tablet Take 1 tablet (5 mg total) by mouth 2 (two) times daily. 09/24/17   Osvaldo Shipper, MD  aspirin 81 MG EC tablet Take 1 tablet (81 mg total) by mouth daily. 04/12/17   Glade Lloyd, MD  atorvastatin (LIPITOR) 80 MG tablet Take 1 tablet (80 mg total) by mouth daily at 6 PM. 04/11/17   Glade Lloyd, MD  clopidogrel (PLAVIX) 75 MG tablet  Take 75 mg by mouth daily.    [provider]  docusate sodium (COLACE) 100 MG capsule Take 100 mg by mouth daily.    [provider]  ferrous sulfate 325 (65 FE) MG tablet Take 325 mg by mouth every evening.     [provider]  furosemide (LASIX) 20 MG tablet Take 1 tablet (20 mg total) by mouth daily. 04/12/17   Glade Lloyd, MD  gabapentin (NEURONTIN) 300 MG capsule Take 300 mg by mouth 3 (three) times daily.     [provider]  Insulin Glargine (BASAGLAR KWIKPEN) 100 UNIT/ML SOPN Inject 40 Units into the skin daily.    Averneni, Carmon Sails, MD  insulin lispro (HUMALOG) 100 UNIT/ML injection Inject 5-18 Units into the skin See admin instructions. units three times  daily before meals Sliding Scale 150-250 = 5 units 251-350= 6-8 units 351-450= 10 units Over 450 18 units    Averneni, Madhavi, MD  metFORMIN (GLUCOPHAGE-XR) 500 MG 24 hr tablet Take 500 mg by mouth 2 (two) times daily.     [provider]  metoCLOPramide (REGLAN) 10 MG tablet Take 1 tablet (10 mg total) by mouth every 6 (six) hours for 30 days. Taper back down to your home dose as your symptoms begin to improve. Patient taking differently: Take 5 mg by mouth 3 (three) times daily.  08/21/18 11/12/18  Cristal Ford, DO  metoprolol tartrate (LOPRESSOR) 25 MG tablet Take 1 tablet (25 mg total) by mouth 2 (two) times daily. 04/11/17   Aline August, MD  nitroGLYCERIN (NITROSTAT) 0.4 MG SL tablet Place 1 tablet (0.4 mg total) under the tongue every 5 (five) minutes as needed for chest pain. 04/11/17   Aline August, MD  [facility] VERIO test strip Use to test blood sugar twice daily 09/24/17   [provider]  pantoprazole (PROTONIX) 40 MG tablet Take 1 tablet (40 mg total) by mouth 2 (two) times daily before a meal. 08/21/18   Mikhail, Velta Addison, DO  prednisoLONE acetate (PRED FORTE) 1 % ophthalmic suspension Place 1 drop into the left eye 4 (four) times daily.  07/21/18   [provider]  promethazine (PHENERGAN) 25 MG tablet Take 25-50 mg by mouth every 6 (six) hours as needed for nausea or vomiting.  05/05/18   [provider]  ranolazine (RANEXA) 500 MG 12 hr tablet Take 1 tablet (500 mg total) by mouth 2 (two) times daily. 04/11/17   Aline August, MD  traMADol (ULTRAM) 50 MG tablet Take 100 mg by mouth every 6 (six) hours as needed for moderate pain.     [provider]    Family History Family History  Problem Relation Age of Onset  . COPD Mother   . Heart disease Mother   . Lung cancer Mother   . Retinal detachment Father   . Alcoholism Brother   . Heart disease Brother   . COPD Brother   . Diabetes Brother   . Hypertension Brother   . Stroke Brother     Social History Social History   Tobacco Use  . Smoking status: Never Smoker  . Smokeless tobacco: Never Used  Substance Use Topics  . Alcohol use: No    Frequency: Never    Comment: 01/07/2014 'might have a wine cooler a couple times/yr"  . Drug use: No     Allergies   Gabapentin, Relafen [nabumetone], and Codeine   Review of Systems Review of Systems  Constitutional: Negative for chills, diaphoresis, fatigue and fever.  HENT: Negative for congestion, rhinorrhea and sneezing.   Eyes: Negative.   Respiratory: Negative for cough, chest tightness and shortness of breath.   Cardiovascular: Negative for chest pain and leg swelling.  Gastrointestinal: Positive for abdominal pain, nausea and vomiting. Negative for blood in stool and diarrhea.  Genitourinary: Negative for difficulty urinating, flank pain, frequency and hematuria.  Musculoskeletal: Negative for arthralgias and back pain.  Skin: Negative for rash.  Neurological: Negative for dizziness, speech difficulty, weakness, numbness and headaches.     Physical Exam Updated Vital Signs BP (!) 155/91   Pulse (!) 114   Temp 98.4 F (36.9 C) (Oral)   Resp (!) 27   Ht 6\' 3"  (1.905 m)   Wt 106.6 kg   SpO2  95%   BMI 29.37 kg/m  Physical Exam Constitutional:      Appearance: He is well-developed.  HENT:     Head: Normocephalic and atraumatic.  Eyes:     Pupils: Pupils are equal, round, and reactive to light.  Neck:     Musculoskeletal: Normal range of motion and neck supple.  Cardiovascular:     Rate and Rhythm: Normal rate and regular rhythm.     Heart sounds: Normal heart sounds.  Pulmonary:     Effort: Pulmonary effort is normal. No respiratory distress.     Breath sounds: Normal breath sounds. No wheezing or rales.  Chest:     Chest wall: No tenderness.  Abdominal:     General: Bowel sounds are normal.     Palpations: Abdomen is soft.     Tenderness: There is no abdominal tenderness. There is no guarding or rebound.  Musculoskeletal: Normal range of motion.  Lymphadenopathy:     Cervical: No cervical adenopathy.  Skin:    General: Skin is warm and dry.     Findings: No rash.  Neurological:     Mental Status: He is alert and oriented to person, place, and time.      ED Treatments / Results  Labs (all labs ordered are listed, but only abnormal results are displayed) Labs Reviewed  COMPREHENSIVE METABOLIC PANEL - Abnormal; Notable for the following components:      Result Value   Chloride 97 (*)    Glucose, Bld 189 (*)    Albumin 3.2 (*)    All other components within normal limits  LIPASE, BLOOD  CBC WITH DIFFERENTIAL/PLATELET    EKG None  Radiology No results found.  Procedures Procedures (including critical care time)  Medications Ordered in ED Medications  sodium chloride 0.9 % bolus 500 mL (0 mLs Intravenous Stopped 02/15/19 1405)  promethazine (PHENERGAN) injection 12.5 mg (12.5 mg Intravenous Given 02/15/19 1230)  famotidine (PEPCID) IVPB 20 mg premix (0 mg Intravenous Stopped 02/15/19 1405)  promethazine (PHENERGAN) injection 12.5 mg (12.5 mg Intravenous Given 02/15/19 1425)  LORazepam (ATIVAN) injection 1 mg (1 mg Intravenous Given 02/15/19 1535)   sodium chloride 0.9 % bolus 500 mL (500 mLs Intravenous New Bag/Given 02/15/19 1823)     Initial Impression / Assessment and Plan / ED Course  I have reviewed the triage vital signs and the nursing notes.  Pertinent labs & imaging results that were available during my care of the patient were reviewed by me and considered in my medical decision making (see chart for details).       Patient is a 68 year old male who presents with vomiting and upper abdominal pain consistent with his prior episodes of gastroparesis.  His labs are non-concerning.  He is feeling better after treatment in the ED.  He was given IV fluids and antiemetics.  He still mildly tachycardic with a heart rate of around 106-108.  I did advise him that it would be beneficial to keep him here and give him some more fluids and wait until his heart rate normalizes.  He is refusing to stay any longer and says that he feels fine and wants to go home.  He is tolerated an oral trial.  He has had no ongoing vomiting.  He was given strict return precautions.   Final Clinical Impressions(s) / ED Diagnoses   Final diagnoses:  Gastroparesis    ED Discharge Orders    None       Rolan BuccoBelfi, Taygan Connell, MD 02/15/19 [phone]

## 2019-03-02 ENCOUNTER — Encounter (HOSPITAL_COMMUNITY): Payer: Self-pay

## 2019-03-02 ENCOUNTER — Other Ambulatory Visit: Payer: Self-pay

## 2019-03-02 ENCOUNTER — Emergency Department (HOSPITAL_COMMUNITY)
Admission: EM | Admit: 2019-03-02 | Discharge: 2019-03-02 | Discharge status: Against Medical Advice | Payer: PPO | Attending: Emergency Medicine | Admitting: Emergency Medicine

## 2019-03-02 DIAGNOSIS — Z5321 Procedure and treatment not carried out due to patient leaving prior to being seen by health care provider: Secondary | ICD-10-CM | POA: Diagnosis not present

## 2019-03-02 DIAGNOSIS — R110 Nausea: Secondary | ICD-10-CM | POA: Diagnosis not present

## 2019-03-02 DIAGNOSIS — R0902 Hypoxemia: Secondary | ICD-10-CM | POA: Diagnosis not present

## 2019-03-02 NOTE — ED Triage Notes (Signed)
Pt BIB EMS from home. Pt reports flare up of gastroparesis. Pt states that he has been dry heaving and not vomiting.   128/90 90 CBG 225 97% RA RR 16  20G LH 4mg  Zofran

## 2019-03-02 NOTE — ED Notes (Signed)
Pt IV taken out by Hassell Done NT in triage. Pt LWBS after triage. Pt educated risks of leaving: worsening condition, disability, and death without evaluation from medical provider. Pt verbalizes understanding and leaves via cab.

## 2019-03-24 DIAGNOSIS — I10 Essential (primary) hypertension: Secondary | ICD-10-CM | POA: Diagnosis not present

## 2019-03-24 DIAGNOSIS — I2510 Atherosclerotic heart disease of native coronary artery without angina pectoris: Secondary | ICD-10-CM | POA: Diagnosis not present

## 2019-03-24 DIAGNOSIS — E1140 Type 2 diabetes mellitus with diabetic neuropathy, unspecified: Secondary | ICD-10-CM | POA: Diagnosis not present

## 2019-03-24 DIAGNOSIS — S88112A Complete traumatic amputation at level between knee and ankle, left lower leg, initial encounter: Secondary | ICD-10-CM | POA: Diagnosis not present

## 2019-03-24 DIAGNOSIS — Z794 Long term (current) use of insulin: Secondary | ICD-10-CM | POA: Diagnosis not present

## 2019-03-24 DIAGNOSIS — E1165 Type 2 diabetes mellitus with hyperglycemia: Secondary | ICD-10-CM | POA: Diagnosis not present

## 2019-04-03 DIAGNOSIS — E13622 Other specified diabetes mellitus with other skin ulcer: Secondary | ICD-10-CM | POA: Diagnosis not present

## 2019-04-03 DIAGNOSIS — M6530 Trigger finger, unspecified finger: Secondary | ICD-10-CM | POA: Diagnosis not present

## 2019-04-03 DIAGNOSIS — E1140 Type 2 diabetes mellitus with diabetic neuropathy, unspecified: Secondary | ICD-10-CM | POA: Diagnosis not present

## 2019-04-03 DIAGNOSIS — Z89512 Acquired absence of left leg below knee: Secondary | ICD-10-CM | POA: Diagnosis not present

## 2019-04-03 DIAGNOSIS — Z23 Encounter for immunization: Secondary | ICD-10-CM | POA: Diagnosis not present

## 2019-04-03 DIAGNOSIS — K3184 Gastroparesis: Secondary | ICD-10-CM | POA: Diagnosis not present

## 2019-04-15 DIAGNOSIS — E790 Hyperuricemia without signs of inflammatory arthritis and tophaceous disease: Secondary | ICD-10-CM | POA: Diagnosis not present

## 2019-04-15 DIAGNOSIS — E11618 Type 2 diabetes mellitus with other diabetic arthropathy: Secondary | ICD-10-CM | POA: Diagnosis not present

## 2019-04-15 DIAGNOSIS — M6530 Trigger finger, unspecified finger: Secondary | ICD-10-CM | POA: Diagnosis not present

## 2019-04-15 DIAGNOSIS — E785 Hyperlipidemia, unspecified: Secondary | ICD-10-CM | POA: Diagnosis not present

## 2019-04-15 DIAGNOSIS — E1169 Type 2 diabetes mellitus with other specified complication: Secondary | ICD-10-CM | POA: Diagnosis not present

## 2019-04-15 DIAGNOSIS — M19041 Primary osteoarthritis, right hand: Secondary | ICD-10-CM | POA: Diagnosis not present

## 2019-04-15 DIAGNOSIS — M79643 Pain in unspecified hand: Secondary | ICD-10-CM | POA: Diagnosis not present

## 2019-04-15 DIAGNOSIS — M7989 Other specified soft tissue disorders: Secondary | ICD-10-CM | POA: Diagnosis not present

## 2019-04-15 DIAGNOSIS — M19042 Primary osteoarthritis, left hand: Secondary | ICD-10-CM | POA: Diagnosis not present

## 2019-04-15 DIAGNOSIS — M1990 Unspecified osteoarthritis, unspecified site: Secondary | ICD-10-CM | POA: Diagnosis not present

## 2019-04-16 DIAGNOSIS — H35033 Hypertensive retinopathy, bilateral: Secondary | ICD-10-CM | POA: Diagnosis not present

## 2019-04-16 DIAGNOSIS — M79642 Pain in left hand: Secondary | ICD-10-CM | POA: Diagnosis not present

## 2019-04-16 DIAGNOSIS — L0292 Furuncle, unspecified: Secondary | ICD-10-CM | POA: Diagnosis not present

## 2019-04-16 DIAGNOSIS — M79641 Pain in right hand: Secondary | ICD-10-CM | POA: Diagnosis not present

## 2019-04-16 DIAGNOSIS — E113293 Type 2 diabetes mellitus with mild nonproliferative diabetic retinopathy without macular edema, bilateral: Secondary | ICD-10-CM | POA: Diagnosis not present

## 2019-04-16 DIAGNOSIS — H35433 Paving stone degeneration of retina, bilateral: Secondary | ICD-10-CM | POA: Diagnosis not present

## 2019-04-16 DIAGNOSIS — L98499 Non-pressure chronic ulcer of skin of other sites with unspecified severity: Secondary | ICD-10-CM | POA: Diagnosis not present

## 2019-04-16 DIAGNOSIS — H31091 Other chorioretinal scars, right eye: Secondary | ICD-10-CM | POA: Diagnosis not present

## 2019-04-16 LAB — HM DIABETES EYE EXAM

## 2019-05-20 ENCOUNTER — Encounter (INDEPENDENT_AMBULATORY_CARE_PROVIDER_SITE_OTHER): Payer: PPO | Admitting: Ophthalmology

## 2019-06-08 ENCOUNTER — Other Ambulatory Visit (HOSPITAL_COMMUNITY): Payer: Self-pay | Admitting: Internal Medicine

## 2019-06-08 DIAGNOSIS — Z86711 Personal history of pulmonary embolism: Secondary | ICD-10-CM | POA: Diagnosis not present

## 2019-06-08 DIAGNOSIS — M7989 Other specified soft tissue disorders: Secondary | ICD-10-CM

## 2019-06-08 DIAGNOSIS — M79604 Pain in right leg: Secondary | ICD-10-CM | POA: Diagnosis not present

## 2019-06-08 DIAGNOSIS — R600 Localized edema: Secondary | ICD-10-CM | POA: Diagnosis not present

## 2019-06-09 ENCOUNTER — Ambulatory Visit (HOSPITAL_COMMUNITY): Admission: RE | Admit: 2019-06-09 | Payer: PPO | Source: Ambulatory Visit

## 2019-06-09 ENCOUNTER — Encounter (HOSPITAL_COMMUNITY): Payer: Self-pay | Admitting: *Deleted

## 2019-06-09 ENCOUNTER — Emergency Department (HOSPITAL_COMMUNITY): Payer: PPO

## 2019-06-09 ENCOUNTER — Inpatient Hospital Stay (HOSPITAL_COMMUNITY)
Admission: EM | Admit: 2019-06-09 | Discharge: 2019-06-13 | DRG: 300 | Disposition: A | Discharge status: Home Health | Payer: PPO | Attending: Internal Medicine | Admitting: Internal Medicine

## 2019-06-09 ENCOUNTER — Other Ambulatory Visit: Payer: Self-pay

## 2019-06-09 ENCOUNTER — Ambulatory Visit (HOSPITAL_COMMUNITY)
Admission: EM | Admit: 2019-06-09 | Discharge: 2019-06-09 | Disposition: A | Discharge status: Home / Self Care | Payer: PPO | Source: Home / Self Care | Attending: Emergency Medicine | Admitting: Emergency Medicine

## 2019-06-09 DIAGNOSIS — I82431 Acute embolism and thrombosis of right popliteal vein: Secondary | ICD-10-CM | POA: Diagnosis not present

## 2019-06-09 DIAGNOSIS — Z885 Allergy status to narcotic agent status: Secondary | ICD-10-CM

## 2019-06-09 DIAGNOSIS — I82441 Acute embolism and thrombosis of right tibial vein: Secondary | ICD-10-CM | POA: Diagnosis not present

## 2019-06-09 DIAGNOSIS — Z794 Long term (current) use of insulin: Secondary | ICD-10-CM | POA: Diagnosis not present

## 2019-06-09 DIAGNOSIS — I10 Essential (primary) hypertension: Secondary | ICD-10-CM | POA: Diagnosis not present

## 2019-06-09 DIAGNOSIS — F329 Major depressive disorder, single episode, unspecified: Secondary | ICD-10-CM | POA: Diagnosis not present

## 2019-06-09 DIAGNOSIS — E1165 Type 2 diabetes mellitus with hyperglycemia: Secondary | ICD-10-CM | POA: Diagnosis not present

## 2019-06-09 DIAGNOSIS — Z888 Allergy status to other drugs, medicaments and biological substances status: Secondary | ICD-10-CM

## 2019-06-09 DIAGNOSIS — Z79899 Other long term (current) drug therapy: Secondary | ICD-10-CM

## 2019-06-09 DIAGNOSIS — E1143 Type 2 diabetes mellitus with diabetic autonomic (poly)neuropathy: Secondary | ICD-10-CM | POA: Diagnosis present

## 2019-06-09 DIAGNOSIS — E1151 Type 2 diabetes mellitus with diabetic peripheral angiopathy without gangrene: Secondary | ICD-10-CM | POA: Diagnosis not present

## 2019-06-09 DIAGNOSIS — J302 Other seasonal allergic rhinitis: Secondary | ICD-10-CM | POA: Diagnosis present

## 2019-06-09 DIAGNOSIS — K219 Gastro-esophageal reflux disease without esophagitis: Secondary | ICD-10-CM | POA: Diagnosis not present

## 2019-06-09 DIAGNOSIS — R52 Pain, unspecified: Secondary | ICD-10-CM | POA: Diagnosis not present

## 2019-06-09 DIAGNOSIS — Z713 Dietary counseling and surveillance: Secondary | ICD-10-CM

## 2019-06-09 DIAGNOSIS — I252 Old myocardial infarction: Secondary | ICD-10-CM

## 2019-06-09 DIAGNOSIS — Z95828 Presence of other vascular implants and grafts: Secondary | ICD-10-CM

## 2019-06-09 DIAGNOSIS — E872 Acidosis: Secondary | ICD-10-CM | POA: Diagnosis present

## 2019-06-09 DIAGNOSIS — E1169 Type 2 diabetes mellitus with other specified complication: Secondary | ICD-10-CM | POA: Diagnosis not present

## 2019-06-09 DIAGNOSIS — Z7982 Long term (current) use of aspirin: Secondary | ICD-10-CM

## 2019-06-09 DIAGNOSIS — L03115 Cellulitis of right lower limb: Secondary | ICD-10-CM | POA: Diagnosis present

## 2019-06-09 DIAGNOSIS — I2510 Atherosclerotic heart disease of native coronary artery without angina pectoris: Secondary | ICD-10-CM | POA: Diagnosis not present

## 2019-06-09 DIAGNOSIS — K449 Diaphragmatic hernia without obstruction or gangrene: Secondary | ICD-10-CM | POA: Diagnosis present

## 2019-06-09 DIAGNOSIS — Z8249 Family history of ischemic heart disease and other diseases of the circulatory system: Secondary | ICD-10-CM

## 2019-06-09 DIAGNOSIS — I82409 Acute embolism and thrombosis of unspecified deep veins of unspecified lower extremity: Secondary | ICD-10-CM | POA: Diagnosis present

## 2019-06-09 DIAGNOSIS — R0602 Shortness of breath: Secondary | ICD-10-CM | POA: Diagnosis not present

## 2019-06-09 DIAGNOSIS — Z7902 Long term (current) use of antithrombotics/antiplatelets: Secondary | ICD-10-CM

## 2019-06-09 DIAGNOSIS — E11610 Type 2 diabetes mellitus with diabetic neuropathic arthropathy: Secondary | ICD-10-CM | POA: Diagnosis not present

## 2019-06-09 DIAGNOSIS — E663 Overweight: Secondary | ICD-10-CM | POA: Diagnosis not present

## 2019-06-09 DIAGNOSIS — Z9049 Acquired absence of other specified parts of digestive tract: Secondary | ICD-10-CM

## 2019-06-09 DIAGNOSIS — O2230 Deep phlebothrombosis in pregnancy, unspecified trimester: Secondary | ICD-10-CM

## 2019-06-09 DIAGNOSIS — E11628 Type 2 diabetes mellitus with other skin complications: Secondary | ICD-10-CM

## 2019-06-09 DIAGNOSIS — I82411 Acute embolism and thrombosis of right femoral vein: Principal | ICD-10-CM | POA: Diagnosis present

## 2019-06-09 DIAGNOSIS — I82401 Acute embolism and thrombosis of unspecified deep veins of right lower extremity: Secondary | ICD-10-CM | POA: Diagnosis not present

## 2019-06-09 DIAGNOSIS — G4733 Obstructive sleep apnea (adult) (pediatric): Secondary | ICD-10-CM | POA: Diagnosis present

## 2019-06-09 DIAGNOSIS — M7989 Other specified soft tissue disorders: Secondary | ICD-10-CM

## 2019-06-09 DIAGNOSIS — I824Y1 Acute embolism and thrombosis of unspecified deep veins of right proximal lower extremity: Secondary | ICD-10-CM | POA: Diagnosis not present

## 2019-06-09 DIAGNOSIS — E785 Hyperlipidemia, unspecified: Secondary | ICD-10-CM | POA: Diagnosis present

## 2019-06-09 DIAGNOSIS — L03119 Cellulitis of unspecified part of limb: Secondary | ICD-10-CM

## 2019-06-09 DIAGNOSIS — F419 Anxiety disorder, unspecified: Secondary | ICD-10-CM | POA: Diagnosis not present

## 2019-06-09 DIAGNOSIS — Z825 Family history of asthma and other chronic lower respiratory diseases: Secondary | ICD-10-CM

## 2019-06-09 DIAGNOSIS — R609 Edema, unspecified: Secondary | ICD-10-CM | POA: Diagnosis not present

## 2019-06-09 DIAGNOSIS — R000 Tachycardia, unspecified: Secondary | ICD-10-CM | POA: Diagnosis not present

## 2019-06-09 DIAGNOSIS — Z86711 Personal history of pulmonary embolism: Secondary | ICD-10-CM

## 2019-06-09 DIAGNOSIS — Z7901 Long term (current) use of anticoagulants: Secondary | ICD-10-CM

## 2019-06-09 DIAGNOSIS — R110 Nausea: Secondary | ICD-10-CM | POA: Diagnosis not present

## 2019-06-09 DIAGNOSIS — K3184 Gastroparesis: Secondary | ICD-10-CM | POA: Diagnosis present

## 2019-06-09 DIAGNOSIS — M5489 Other dorsalgia: Secondary | ICD-10-CM | POA: Diagnosis not present

## 2019-06-09 DIAGNOSIS — Z86718 Personal history of other venous thrombosis and embolism: Secondary | ICD-10-CM

## 2019-06-09 DIAGNOSIS — Z9114 Patient's other noncompliance with medication regimen: Secondary | ICD-10-CM

## 2019-06-09 DIAGNOSIS — M79671 Pain in right foot: Secondary | ICD-10-CM | POA: Diagnosis not present

## 2019-06-09 DIAGNOSIS — Z66 Do not resuscitate: Secondary | ICD-10-CM | POA: Diagnosis not present

## 2019-06-09 DIAGNOSIS — Z79891 Long term (current) use of opiate analgesic: Secondary | ICD-10-CM

## 2019-06-09 DIAGNOSIS — Z20828 Contact with and (suspected) exposure to other viral communicable diseases: Secondary | ICD-10-CM | POA: Diagnosis not present

## 2019-06-09 DIAGNOSIS — N179 Acute kidney failure, unspecified: Secondary | ICD-10-CM | POA: Diagnosis present

## 2019-06-09 DIAGNOSIS — Z833 Family history of diabetes mellitus: Secondary | ICD-10-CM

## 2019-06-09 DIAGNOSIS — Z89512 Acquired absence of left leg below knee: Secondary | ICD-10-CM

## 2019-06-09 DIAGNOSIS — Z955 Presence of coronary angioplasty implant and graft: Secondary | ICD-10-CM

## 2019-06-09 DIAGNOSIS — R509 Fever, unspecified: Secondary | ICD-10-CM | POA: Diagnosis not present

## 2019-06-09 LAB — URINALYSIS, ROUTINE W REFLEX MICROSCOPIC
Bacteria, UA: NONE SEEN
Bilirubin Urine: NEGATIVE
Glucose, UA: >=500 mg/dL — AB
Hgb urine dipstick: NEGATIVE
Ketones, ur: 20 mg/dL — AB
Leukocytes,Ua: NEGATIVE
Nitrite: NEGATIVE
Protein, ur: 30 mg/dL — AB
Specific Gravity, Urine: >1.046 — ABNORMAL HIGH (ref 1.005–1.030)
pH: 5 (ref 5.0–8.0)

## 2019-06-09 LAB — CBC
HCT: 41.1 % (ref 39.0–52.0)
Hemoglobin: 13.5 g/dL (ref 13.0–17.0)
MCH: 30.7 pg (ref 26.0–34.0)
MCHC: 32.8 g/dL (ref 30.0–36.0)
MCV: 93.4 fL (ref 80.0–100.0)
Platelets: 198 10*3/uL (ref 150–400)
RBC: 4.4 MIL/uL (ref 4.22–5.81)
RDW: 13.3 % (ref 11.5–15.5)
WBC: 12 10*3/uL — ABNORMAL HIGH (ref 4.0–10.5)
nRBC: 0 % (ref 0.0–0.2)

## 2019-06-09 LAB — LACTIC ACID, PLASMA
Lactic Acid, Venous: 2.9 mmol/L (ref 0.5–1.9)
Lactic Acid, Venous: 1.3 mmol/L (ref 0.5–1.9)

## 2019-06-09 LAB — PROTIME-INR
INR: 1.1 (ref 0.8–1.2)
Prothrombin Time: 13.8 seconds (ref 11.4–15.2)

## 2019-06-09 LAB — COMPREHENSIVE METABOLIC PANEL
ALT: 17 U/L (ref 0–44)
AST: 28 U/L (ref 15–41)
Albumin: 3.5 g/dL (ref 3.5–5.0)
Alkaline Phosphatase: 79 U/L (ref 38–126)
Anion gap: 17 — ABNORMAL HIGH (ref 5–15)
BUN: 34 mg/dL — ABNORMAL HIGH (ref 8–23)
CO2: 21 mmol/L — ABNORMAL LOW (ref 22–32)
Calcium: 9.3 mg/dL (ref 8.9–10.3)
Chloride: 99 mmol/L (ref 98–111)
Creatinine, Ser: 1.27 mg/dL — ABNORMAL HIGH (ref 0.61–1.24)
GFR calc Af Amer: >60 mL/min (ref >60)
GFR calc non Af Amer: 58 mL/min — ABNORMAL LOW (ref >60)
Glucose, Bld: 212 mg/dL — ABNORMAL HIGH (ref 70–99)
Potassium: 4.1 mmol/L (ref 3.5–5.1)
Sodium: 137 mmol/L (ref 135–145)
Total Bilirubin: 1.2 mg/dL (ref 0.3–1.2)
Total Protein: 7.9 g/dL (ref 6.5–8.1)

## 2019-06-09 LAB — HEPARIN LEVEL (UNFRACTIONATED)
Heparin Unfractionated: <0.1 IU/mL — ABNORMAL LOW (ref 0.30–0.70)
Heparin Unfractionated: 1.32 IU/mL — ABNORMAL HIGH (ref 0.30–0.70)
Heparin Unfractionated: 0.62 IU/mL (ref 0.30–0.70)

## 2019-06-09 LAB — SARS CORONAVIRUS 2 (TAT 6-24 HRS): SARS Coronavirus 2: NEGATIVE

## 2019-06-09 LAB — APTT: aPTT: 33 seconds (ref 24–36)

## 2019-06-09 LAB — CBG MONITORING, ED: Glucose-Capillary: 256 mg/dL — ABNORMAL HIGH (ref 70–99)

## 2019-06-09 LAB — LIPASE, BLOOD: Lipase: 15 U/L (ref 11–51)

## 2019-06-09 LAB — TSH: TSH: 0.561 u[IU]/mL (ref 0.350–4.500)

## 2019-06-09 MED ORDER — PANTOPRAZOLE SODIUM 40 MG PO TBEC
40.0000 mg | DELAYED_RELEASE_TABLET | Freq: Two times a day (BID) | ORAL | Status: DC
  Administered 2019-06-10 – 2019-06-13 (×6): 40 mg via ORAL
  Filled 2019-06-09 (×7): qty 1

## 2019-06-09 MED ORDER — AMLODIPINE BESYLATE 10 MG PO TABS
10.0000 mg | ORAL_TABLET | Freq: Every day | ORAL | Status: DC
  Administered 2019-06-09 – 2019-06-13 (×4): 10 mg via ORAL
  Filled 2019-06-09: qty 2
  Filled 2019-06-09 (×3): qty 1
  Filled 2019-06-09: qty 2

## 2019-06-09 MED ORDER — METOCLOPRAMIDE HCL 5 MG/ML IJ SOLN
10.0000 mg | Freq: Once | INTRAMUSCULAR | Status: AC
  Administered 2019-06-09: 10 mg via INTRAVENOUS
  Filled 2019-06-09: qty 2

## 2019-06-09 MED ORDER — SODIUM CHLORIDE (PF) 0.9 % IJ SOLN
INTRAMUSCULAR | Status: AC
  Filled 2019-06-09: qty 50

## 2019-06-09 MED ORDER — HEPARIN (PORCINE) 25000 UT/250ML-% IV SOLN
1700.0000 [IU]/h | INTRAVENOUS | Status: DC
  Administered 2019-06-09 – 2019-06-10 (×2): 1700 [IU]/h via INTRAVENOUS
  Filled 2019-06-09 (×2): qty 250

## 2019-06-09 MED ORDER — DOCUSATE SODIUM 100 MG PO CAPS
100.0000 mg | ORAL_CAPSULE | Freq: Every day | ORAL | Status: DC
  Administered 2019-06-09 – 2019-06-13 (×3): 100 mg via ORAL
  Filled 2019-06-09 (×4): qty 1

## 2019-06-09 MED ORDER — SODIUM CHLORIDE 0.9 % IV SOLN: INTRAVENOUS | Status: DC

## 2019-06-09 MED ORDER — TRAMADOL HCL 50 MG PO TABS
100.0000 mg | ORAL_TABLET | Freq: Four times a day (QID) | ORAL | Status: DC | PRN
  Administered 2019-06-10 – 2019-06-11 (×2): 100 mg via ORAL
  Filled 2019-06-09 (×2): qty 2

## 2019-06-09 MED ORDER — BISACODYL 10 MG RE SUPP: 10.0000 mg | Freq: Every day | RECTAL | Status: DC | PRN

## 2019-06-09 MED ORDER — METOPROLOL TARTRATE 25 MG PO TABS
25.0000 mg | ORAL_TABLET | Freq: Two times a day (BID) | ORAL | Status: DC
  Administered 2019-06-09 – 2019-06-13 (×8): 25 mg via ORAL
  Filled 2019-06-09 (×8): qty 1

## 2019-06-09 MED ORDER — CEFAZOLIN SODIUM-DEXTROSE 2-4 GM/100ML-% IV SOLN
2.0000 g | Freq: Three times a day (TID) | INTRAVENOUS | Status: DC
  Administered 2019-06-09 – 2019-06-13 (×11): 2 g via INTRAVENOUS
  Filled 2019-06-09 (×14): qty 100

## 2019-06-09 MED ORDER — ACETAMINOPHEN 325 MG PO TABS
650.0000 mg | ORAL_TABLET | Freq: Four times a day (QID) | ORAL | Status: DC | PRN
  Administered 2019-06-10: 650 mg via ORAL
  Filled 2019-06-09: qty 2

## 2019-06-09 MED ORDER — NITROGLYCERIN 0.4 MG SL SUBL: 0.4000 mg | SUBLINGUAL_TABLET | SUBLINGUAL | Status: DC | PRN

## 2019-06-09 MED ORDER — SODIUM CHLORIDE 0.9 % IV BOLUS
1000.0000 mL | Freq: Once | INTRAVENOUS | Status: AC
  Administered 2019-06-09: 1000 mL via INTRAVENOUS

## 2019-06-09 MED ORDER — ATORVASTATIN CALCIUM 40 MG PO TABS
80.0000 mg | ORAL_TABLET | Freq: Every day | ORAL | Status: DC
  Administered 2019-06-09 – 2019-06-12 (×4): 80 mg via ORAL
  Filled 2019-06-09 (×5): qty 2

## 2019-06-09 MED ORDER — LORAZEPAM 2 MG/ML IJ SOLN
0.5000 mg | Freq: Once | INTRAMUSCULAR | Status: AC
  Administered 2019-06-09: 10:00:00 0.5 mg via INTRAVENOUS
  Filled 2019-06-09: qty 1

## 2019-06-09 MED ORDER — PROMETHAZINE HCL 25 MG/ML IJ SOLN
12.5000 mg | Freq: Four times a day (QID) | INTRAMUSCULAR | Status: DC | PRN
  Administered 2019-06-09 – 2019-06-11 (×7): 12.5 mg via INTRAVENOUS
  Filled 2019-06-09 (×7): qty 1

## 2019-06-09 MED ORDER — INSULIN GLARGINE 100 UNIT/ML ~~LOC~~ SOLN
40.0000 [IU] | Freq: Every day | SUBCUTANEOUS | Status: DC
  Administered 2019-06-09 – 2019-06-12 (×4): 40 [IU] via SUBCUTANEOUS
  Filled 2019-06-09 (×5): qty 0.4

## 2019-06-09 MED ORDER — ONDANSETRON HCL 4 MG PO TABS: 4.0000 mg | ORAL_TABLET | Freq: Four times a day (QID) | ORAL | Status: DC | PRN

## 2019-06-09 MED ORDER — INSULIN ASPART 100 UNIT/ML ~~LOC~~ SOLN
0.0000 [IU] | Freq: Every day | SUBCUTANEOUS | Status: DC
  Administered 2019-06-09 – 2019-06-10 (×2): 3 [IU] via SUBCUTANEOUS
  Administered 2019-06-11 – 2019-06-12 (×2): 2 [IU] via SUBCUTANEOUS
  Filled 2019-06-09: qty 0.05

## 2019-06-09 MED ORDER — HEPARIN BOLUS VIA INFUSION
3000.0000 [IU] | Freq: Once | INTRAVENOUS | Status: AC
  Administered 2019-06-09: 3000 [IU] via INTRAVENOUS
  Filled 2019-06-09: qty 3000

## 2019-06-09 MED ORDER — SODIUM CHLORIDE 0.9% FLUSH
3.0000 mL | Freq: Once | INTRAVENOUS | Status: AC
  Administered 2019-06-09: 10:00:00 3 mL via INTRAVENOUS

## 2019-06-09 MED ORDER — ACETAMINOPHEN 650 MG RE SUPP: 650.0000 mg | Freq: Four times a day (QID) | RECTAL | Status: DC | PRN

## 2019-06-09 MED ORDER — ASPIRIN EC 81 MG PO TBEC
81.0000 mg | DELAYED_RELEASE_TABLET | Freq: Every day | ORAL | Status: DC
  Administered 2019-06-09 – 2019-06-13 (×4): 81 mg via ORAL
  Filled 2019-06-09 (×5): qty 1

## 2019-06-09 MED ORDER — ONDANSETRON HCL 4 MG/2ML IJ SOLN
4.0000 mg | Freq: Four times a day (QID) | INTRAMUSCULAR | Status: DC | PRN
  Administered 2019-06-09 – 2019-06-12 (×8): 4 mg via INTRAVENOUS
  Filled 2019-06-09 (×8): qty 2

## 2019-06-09 MED ORDER — POLYETHYLENE GLYCOL 3350 17 G PO PACK: 17.0000 g | PACK | Freq: Every day | ORAL | Status: DC | PRN

## 2019-06-09 MED ORDER — FERROUS SULFATE 325 (65 FE) MG PO TABS
325.0000 mg | ORAL_TABLET | Freq: Every evening | ORAL | Status: DC
  Administered 2019-06-09 – 2019-06-12 (×4): 325 mg via ORAL
  Filled 2019-06-09 (×5): qty 1

## 2019-06-09 MED ORDER — INSULIN ASPART 100 UNIT/ML ~~LOC~~ SOLN
0.0000 [IU] | Freq: Three times a day (TID) | SUBCUTANEOUS | Status: DC
  Administered 2019-06-10: 3 [IU] via SUBCUTANEOUS
  Administered 2019-06-10: 2 [IU] via SUBCUTANEOUS
  Administered 2019-06-10: 8 [IU] via SUBCUTANEOUS
  Administered 2019-06-11 (×2): 2 [IU] via SUBCUTANEOUS
  Administered 2019-06-12: 5 [IU] via SUBCUTANEOUS
  Administered 2019-06-12: 2 [IU] via SUBCUTANEOUS
  Administered 2019-06-12 – 2019-06-13 (×2): 3 [IU] via SUBCUTANEOUS
  Administered 2019-06-13: 2 [IU] via SUBCUTANEOUS
  Filled 2019-06-09: qty 0.15

## 2019-06-09 MED ORDER — FENTANYL CITRATE (PF) 100 MCG/2ML IJ SOLN
25.0000 ug | Freq: Once | INTRAMUSCULAR | Status: AC
  Administered 2019-06-09: 25 ug via INTRAVENOUS
  Filled 2019-06-09: qty 2

## 2019-06-09 MED ORDER — RANOLAZINE ER 500 MG PO TB12
500.0000 mg | ORAL_TABLET | Freq: Two times a day (BID) | ORAL | Status: DC
  Administered 2019-06-09 – 2019-06-13 (×8): 500 mg via ORAL
  Filled 2019-06-09 (×10): qty 1

## 2019-06-09 MED ORDER — IOHEXOL 350 MG/ML SOLN
100.0000 mL | Freq: Once | INTRAVENOUS | Status: AC | PRN
  Administered 2019-06-09: 100 mL via INTRAVENOUS

## 2019-06-09 NOTE — Progress Notes (Addendum)
ANTICOAGULATION CONSULT NOTE - Initial Consult  Pharmacy Consult for Heparin Indication: DVT (with a history of PE)  Allergies  Allergen Reactions  . Gabapentin Hives, Itching, Nausea And Vomiting and Rash    REACTION: rash/hives (per patient, was a reaction to an inactive ingredient in another mgf brand). The patient stated that he does take this medication now and it doesn't cause a rash any more.  . Relafen [Nabumetone] Nausea And Vomiting and Rash  . Codeine Nausea And Vomiting and Rash    Patient Measurements:    Last documented weight = 106kg (03/02/19) Heparin Dosing Weight: actual body weight  Vital Signs: Temp: 98.6 F (37 C) (12/22 0832) Temp Source: Oral (12/22 0832) BP: 113/62 (12/22 1130) Pulse Rate: 104 (12/22 1130)  Labs: Recent Labs    06/09/19 0935  HGB 13.5  HCT 41.1  PLT 198  CREATININE 1.27*    CrCl cannot be calculated (Unknown ideal weight.).   Medical History: Past Medical History:  Diagnosis Date  . Anxiety   . Arthritis    "all over"   . CAD (coronary artery disease)   . Charcot's joint    "left foot"  . 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 Depression   . Gastroparesis   . GERD (gastroesophageal reflux disease)   . H/O hiatal hernia   . Hyperlipidemia   . Hypertension   . Myocardial infarction ([place]) 2017/03/27   around this date  . OSA (obstructive sleep apnea)    "not bad enough for a mask"  . Peripheral neuropathy   . Peripheral vascular disease ([place])   . PONV (postoperative nausea and vomiting)   . Pulmonary embolism ([place])    hx. of 2012  . Shortness of breath    exertion  . Type II diabetes mellitus (HCC)     Medications:  PTA Eliquis 5mg  BID   Assessment:  68 yr male presents to ED with right lower leg pain.  PMH significant for left AKA, PE (on Eliquis), DM, gastroparesis, +IVC filter  Vascular ultrasound = + extensive DVT in right lower extremity  Last reported Eliquis dose = 06/07/19  @ 20:00  Per ED provider note, patient had been off Eliquis for several days due to a dental procedure  Pharmacy consulted to dose IV heparin   Eliquis can falsely elevate heparin level.  Will monitor heparin therapy with both heparin level and aPTT until levels correlate   Goal of Therapy:  Heparin level 0.3-0.7 units/ml aPTT 66-102 seconds Monitor platelets by anticoagulation protocol: Yes   Plan:   Obtain baseline PT/INR, aPTT and heparin level  As patient has been off Eliquis for > 24 hours, will give Heparin 3000 unit IV bolus x 1 followed by heparin infusion @ 1700 units/hr  Updated weight in Epic ordered and will adjust heparin dosing as needed once actual weight documented.  Check aPTT and heparin level 6 hrs after heparin started  Follow heparin level and CBC daily while on heparin  Mattilyn Crites, Toribio Harbour, PharmD 06/09/2019,1:48 PM   ADDENDUM: Baseline coag labs have resulted as followed: - heparin level < 0.1 - aPTT = 33sec - INR = 1.1  Assessment:  As HL < 0.1 and aPTT < 47 sec, no need to monitor heparin infusion with aPTT as effects of Eliquis have worn off  Plan:  Will monitor with only heparin levels  Leone Haven, PharmD 06/09/19 @ 14:52

## 2019-06-09 NOTE — H&P (Signed)
History and Physical    Samuel [name] WHQ:[id number] DOB: April 03, 1951 DOA: 06/09/2019  PCP: Jani Gravel, MD   Patient coming from: Home  Chief Complaint: Leg Pain, Nausea and Vomiting and Abdominal Pain   HPI: Samuel [name] is a 68 y.o. male with medical history significant for but not limited too with gastroparesis, left lower leg amputation, CAD, depression and anxiety, GERD, history of MI, hypertension, OSA, as well as other comorbidities who presents with chief complaint of vomiting and nausea with abdominal pain which is consistent with his prior gastroparesis episodes.  Denies chest pressure or pain and has not been short of breath but states that he is also having some right lower extremity pain and increased swelling as well as redness.  He denies any trauma to the leg or insect bites and has chronic wounds to his right foot which has been cared for by a home health nurse.  Of note he has been on Eliquis for history of DVT and PE but stopped it for last 2 weeks himself due to him anticipating a dental procedure which is still not been scheduled or done yet.  He states that his leg has been more swollen and he states that he has been hurting a little bit more than normal.  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  Evaluation showed that he had an acute DVT and cellulitis.  TRH was called admit this patient for cellulitis and DVT  ED Course: In the ED patient had basic blood work done and further imaging done with a CTA bilateral femoral arteriogram runoff, there was a lower extremity venous duplex.  SARS-CoV-2 testing is still pending  Review of Systems: As per HPI otherwise all other systems reviewed and negative.   Past Medical History:  Diagnosis Date  . Anxiety   . Arthritis    "all over"   . CAD (coronary artery disease)   . Charcot's joint    "left foot"  . 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 Depression   . Gastroparesis   . GERD (gastroesophageal reflux disease)   . H/O hiatal hernia   . Hyperlipidemia   . Hypertension   . Myocardial infarction ([place]) 2017/03/27   around this date  . OSA (obstructive sleep apnea)    "not bad enough for a mask"  . Peripheral neuropathy   . Peripheral vascular disease ([place])   . PONV (postoperative nausea and vomiting)   . Pulmonary embolism ([place])    hx. of 2012  . Shortness of breath    exertion  . Type II diabetes mellitus ([place])    Past Surgical History:  Procedure Laterality Date  . AMPUTATION Left 03/08/2015   Procedure:  LEFT AMPUTATION BELOW KNEE;  Surgeon: Wylene Simmer, MD;  Location: [facility];  Service: Orthopedics;  Laterality: Left;  . APPLICATION OF WOUND VAC Left 01/07/2014  . CHOLECYSTECTOMY  06/2010  . CORONARY ANGIOPLASTY WITH STENT PLACEMENT  05/2009   "1"  . FOOT SURGERY Left 2010   "for Charcot's joint"  . HARDWARE REMOVAL Left 01/07/2014   Procedure: LEFT LEG REMOVAL OF DEEP IMPLANT AND SEQUESTRECTOMY; APPLICATION OF WOUND VAC ;  Surgeon: Wylene Simmer, MD;  Location: [facility];  Service: Orthopedics;  Laterality: Left;  . I & D EXTREMITY Left "multiple"   leg  . I & D EXTREMITY Left 01/07/2014   Procedure:  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;  Surgeon: Toni Arthurs, MD;  Location: [facility];  Service: Orthopedics;  Laterality: Left;  . IM NAILING TIBIA Left ~ 2012  . LEFT HEART CATH AND CORONARY ANGIOGRAPHY N/A 04/08/2017   Procedure: LEFT HEART CATH AND CORONARY ANGIOGRAPHY;  Surgeon: Runell Gess, MD;  Location: [facility];  Service: Cardiovascular;  Laterality: N/A;  . SHOULDER ARTHROSCOPY W/ ROTATOR CUFF REPAIR Left    "and bone spurs"  . TIBIAL IM ROD REMOVAL Left 01/07/2014  . TOE AMPUTATION Right ~ 2011   "great toe"  . VENA CAVA FILTER PLACEMENT  2012  . VENA CAVA FILTER PLACEMENT N/A 11/20/2016   Procedure: INSERTION VENA-CAVA FILTER;  Surgeon: Sherren Kerns,  MD;  Location: [facility];  Service: Vascular;  Laterality: N/A;  . WOUND DEBRIDEMENT Left 01/07/2014   "tibia"   SOCIAL HISTORY   reports that he has never smoked. He has never used smokeless tobacco. He reports that he does not drink alcohol or use drugs.  Allergies  Allergen Reactions  . Gabapentin Hives, Itching, Nausea And Vomiting and Rash    REACTION: rash/hives (per patient, was a reaction to an inactive ingredient in another mgf brand). The patient stated that he does take this medication now and it doesn't cause a rash any more.  . Relafen [Nabumetone] Nausea And Vomiting and Rash  . Codeine Nausea And Vomiting and Rash   Family History  Problem Relation Age of Onset  . COPD Mother   . Heart disease Mother   . Lung cancer Mother   . Retinal detachment Father   . Alcoholism Brother   . Heart disease Brother   . COPD Brother   . Diabetes Brother   . Hypertension Brother   . Stroke Brother    Prior to Admission medications   Medication Sig Start Date End Date Taking? Authorizing Provider  amLODipine (NORVASC) 10 MG tablet Take 1 tablet (10 mg total) by mouth daily. 11/22/16  Yes Richarda Overlie, MD  apixaban (ELIQUIS) 5 MG TABS tablet Take 1 tablet (5 mg total) by mouth 2 (two) times daily. 09/24/17  Yes Osvaldo Shipper, MD  aspirin 81 MG EC tablet Take 1 tablet (81 mg total) by mouth daily. 04/12/17  Yes Glade Lloyd, MD  atorvastatin (LIPITOR) 80 MG tablet Take 1 tablet (80 mg total) by mouth daily at 6 PM. 04/11/17  Yes Hanley Ben, Kshitiz, MD  clopidogrel (PLAVIX) 75 MG tablet Take 75 mg by mouth daily.   Yes [provider]  docusate sodium (COLACE) 100 MG capsule Take 100 mg by mouth daily.   Yes [provider]  ferrous sulfate 325 (65 FE) MG tablet Take 325 mg by mouth every evening.    Yes [provider]  furosemide (LASIX) 20 MG tablet Take 1 tablet (20 mg total) by mouth daily. Patient taking differently: Take 20 mg by mouth daily as needed for  fluid or edema.  04/12/17  Yes Glade Lloyd, MD  gabapentin (NEURONTIN) 300 MG capsule Take 600 mg by mouth 2 (two) times daily.    Yes [provider]  Insulin Glargine (BASAGLAR KWIKPEN) 100 UNIT/ML SOPN Inject 40 Units into the skin daily.   Yes Averneni, Carmon Sails, MD  insulin lispro (HUMALOG) 100 UNIT/ML injection Inject 5-18 Units into the skin See admin instructions. units three times daily before meals Sliding Scale 150-250 = 5 units 251-350= 6-8 units 351-450= 10 units Over 450 18 units   Yes Averneni,  Carmon SailsMadhavi, MD  metFORMIN (GLUCOPHAGE-XR) 500 MG 24 hr tablet Take 500 mg by mouth daily.   Yes [provider]  metoprolol tartrate (LOPRESSOR) 25 MG tablet Take 1 tablet (25 mg total) by mouth 2 (two) times daily. 04/11/17  Yes Glade LloydAlekh, Kshitiz, MD  nitroGLYCERIN (NITROSTAT) 0.4 MG SL tablet Place 1 tablet (0.4 mg total) under the tongue every 5 (five) minutes as needed for chest pain. 04/11/17  Yes Glade LloydAlekh, Kshitiz, MD  [facility] test strip Use to test blood sugar twice daily 09/24/17  Yes [provider]  pantoprazole (PROTONIX) 40 MG tablet Take 1 tablet (40 mg total) by mouth 2 (two) times daily before a meal. 08/21/18  Yes Mikhail, Mountain IronMaryann, DO  promethazine (PHENERGAN) 25 MG suppository Place 1 suppository (25 mg total) rectally every 6 (six) hours as needed for nausea or vomiting. 04/18/18  Yes Long, Arlyss RepressJoshua G, MD  promethazine (PHENERGAN) 25 MG tablet Take 25-50 mg by mouth every 6 (six) hours as needed for nausea or vomiting.  05/05/18  Yes [provider]  promethazine (PHENERGAN) 25 MG/ML injection Inject 25 mg into the muscle every 6 (six) hours as needed for nausea/vomiting. 04/02/19  Yes [provider]  ranolazine (RANEXA) 500 MG 12 hr tablet Take 1 tablet (500 mg total) by mouth 2 (two) times daily. 04/11/17  Yes Glade LloydAlekh, Kshitiz, MD  traMADol (ULTRAM) 50 MG tablet Take 100 mg by mouth every 6 (six) hours as needed for moderate pain.    Yes  [provider]   Physical Exam: Vitals:   06/09/19 1600 06/09/19 1700 06/09/19 1730 06/09/19 1745  BP: 129/85 132/83 134/83   Pulse:    100  Resp: (!) 30 12 (!) 9 15  Temp:      TempSrc:      SpO2:    96%   Constitutional: WN/WD overweight Caucasian male in NAD and appears calm and comfortable Eyes: PERRL, lids and conjunctivae normal  ENMT: External Ears, Nose appear normal. Grossly normal hearing.  Neck: Appears normal, supple, no cervical masses, normal ROM, no appreciable thyromegaly; no JVD Respiratory: Diminished to auscultation bilaterally, no wheezing, rales, rhonchi or crackles. Normal respiratory effort and patient is not tachypenic. No accessory muscle use on a limited skin evaluation.  Cardiovascular: RRR, no murmurs / rubs / gallops. S1 and S2 auscultated. 1+ Right Leg extremity edema.  Abdomen: Soft, non-tender, Distended 2/2 body habitus. No masses palpated. No appreciable hepatosplenomegaly. Bowel sounds positive.  GU: Deferred. Musculoskeletal: Left BKA; Right Leg Swollen and has some erythema Skin: Erythematous right Leg and Right foot with a wound. No induration; Warm and dry.  Neurologic: CN 2-12 grossly intact with no focal deficits. Romberg sign and cerebellar reflexes not assessed.  Psychiatric: Normal judgment and insight. Alert and oriented x 3. Anxious mood and appropriate affect.   Labs on Admission: I have personally reviewed following labs and imaging studies  CBC: Recent Labs  Lab 06/09/19 0935  WBC 12.0*  HGB 13.5  HCT 41.1  MCV 93.4  PLT 198   Basic Metabolic Panel: Recent Labs  Lab 06/09/19 0935  NA 137  K 4.1  CL 99  CO2 21*  GLUCOSE 212*  BUN 34*  CREATININE 1.27*  CALCIUM 9.3   GFR: CrCl cannot be calculated (Unknown ideal weight.). Liver Function Tests: Recent Labs  Lab 06/09/19 0935  AST 28  ALT 17  ALKPHOS 79  BILITOT 1.2  PROT 7.9  ALBUMIN 3.5   Recent Labs  Lab 06/09/19 0935  LIPASE 15   No results  for input(s): AMMONIA in the last 168 hours. Coagulation Profile: Recent Labs  Lab 06/09/19 1351  INR 1.1   Cardiac Enzymes: No results for input(s): CKTOTAL, CKMB, CKMBINDEX, TROPONINI in the last 168 hours. BNP (last 3 results) No results for input(s): PROBNP in the last 8760 hours. HbA1C: No results for input(s): HGBA1C in the last 72 hours. CBG: No results for input(s): GLUCAP in the last 168 hours. Lipid Profile: No results for input(s): CHOL, HDL, LDLCALC, TRIG, CHOLHDL, LDLDIRECT in the last 72 hours. Thyroid Function Tests: No results for input(s): TSH, T4TOTAL, FREET4, T3FREE, THYROIDAB in the last 72 hours. Anemia Panel: No results for input(s): VITAMINB12, FOLATE, FERRITIN, TIBC, IRON, RETICCTPCT in the last 72 hours. Urine analysis:    Component Value Date/Time   COLORURINE YELLOW 06/09/2019 1659   APPEARANCEUR CLEAR 06/09/2019 1659   LABSPEC >1.046 (H) 06/09/2019 1659   PHURINE 5.0 06/09/2019 1659   GLUCOSEU >=500 (A) 06/09/2019 1659   HGBUR NEGATIVE 06/09/2019 1659   BILIRUBINUR NEGATIVE 06/09/2019 1659   KETONESUR 20 (A) 06/09/2019 1659   PROTEINUR 30 (A) 06/09/2019 1659   UROBILINOGEN 1.0 04/05/2015 1155   NITRITE NEGATIVE 06/09/2019 1659   LEUKOCYTESUR NEGATIVE 06/09/2019 1659   Sepsis Labs: !!!!!!!!!!!!!!!!!!!!!!!!!!!!!!!!!!!!!!!!!!!! @LABRCNTIP (procalcitonin:4,lacticidven:4) ) Recent Results (from the past 240 hour(s))  blood cx x2     Status: None (Preliminary result)   Collection Time: 06/09/19  9:35 AM   Specimen: BLOOD  Result Value Ref Range Status   Specimen Description   Final    BLOOD RIGHT WRIST Performed at [facility], [address], [place], [place]    Special Requests   Final    BOTTLES DRAWN AEROBIC AND ANAEROBIC Blood Culture adequate volume   Culture   Final    NO GROWTH < 12 HOURS Performed at [facility], [address], [place], [place]    Report Status PENDING  Incomplete    blood cx x2     Status: None (Preliminary result)   Collection Time: 06/09/19  9:35 AM   Specimen: BLOOD RIGHT HAND  Result Value Ref Range Status   Specimen Description   Final    BLOOD RIGHT HAND Performed at [facility], [address], [place], [place]    Special Requests   Final    BOTTLES DRAWN AEROBIC AND ANAEROBIC Blood Culture adequate volume   Culture   Final    NO GROWTH < 12 HOURS Performed at [facility], [address] [place], [place]    Report Status PENDING  Incomplete    Radiological Exams on Admission: CT ANGIO AO+BIFEM W & OR WO CONTRAST  Result Date: 06/09/2019 CLINICAL DATA:  Extensive right lower extremity DVT from the level of the common femoral vein to the calf veins. Discoloration and foul-smelling right lower extremity. Prior below-knee amputation of the left lower extremity. EXAM: CT ANGIOGRAPHY OF ABDOMINAL AORTA WITH ILIOFEMORAL RUNOFF TECHNIQUE: Multidetector CT imaging of the abdomen, pelvis and lower extremities was performed using the standard protocol during bolus administration of intravenous contrast. Multiplanar CT image reconstructions and MIPs were obtained to evaluate the vascular anatomy. CONTRAST:  06/11/2019 OMNIPAQUE IOHEXOL 350 MG/ML SOLN COMPARISON:  CT of the abdomen and pelvis on 08/17/2018 FINDINGS: VASCULAR Aorta: The abdominal aorta is normally patent and demonstrates mild scattered calcified plaque. No evidence of aneurysmal disease or dissection. Celiac: Mild plaque at the origin of the  celiac axis without significant stenosis. Normal branch vessel anatomy and patency. SMA: Mild calcified plaque at the origin of the SMA without significant stenosis. Normally patent visualized branch vessels. Renals: Bilateral single renal arteries demonstrate no significant stenosis. IMA: Normally patent. Venous: Indwelling IVC filter within the lower aspect of the IVC. Venous opacification is not adequate to  evaluate the IVC or iliac veins for the presence of thrombus. No asymmetry in caliber of the iliac veins to suggest obvious distension from thrombus. No evidence of extrinsic compression of venous structures in the abdomen or pelvis. RIGHT Lower Extremity Inflow: Normally patent right-sided iliac arteries. Outflow: Common femoral artery and femoral bifurcation are widely patent. Profunda femoral artery and superficial femoral artery demonstrate mild scattered plaque without evidence of significant stenosis. Runoff: Normally patent popliteal artery. Three-vessel runoff below the knee with dominant anterior tibial runoff which appears patent into the foot. The posterior tibial artery is diffusely small in caliber. The peroneal artery shows scattered plaque but appears to be patent at least to the level of the ankle. LEFT Lower Extremity Inflow: Normally patent left-sided iliac arteries. Outflow: Common femoral artery and femoral bifurcation are widely patent. Profunda femoral artery and superficial femoral artery demonstrate mild scattered plaque without evidence of significant stenosis. Runoff: Above knee popliteal artery is normally patent. Below-knee amputation present. Review of the MIP images confirms the above findings. NON-VASCULAR Lower chest: No acute abnormality. Hepatobiliary: Probable hepatic steatosis. No focal lesion identified. Status post cholecystectomy. No biliary dilatation. Pancreas: Diffuse atrophy and fatty replacement of the pancreatic parenchyma. Spleen: Normal in size without focal abnormality. Adrenals/Urinary Tract: No adrenal masses. Stable left upper pole renal cyst. No hydronephrosis. The bladder is unremarkable. Stomach/Bowel: Bowel shows no evidence of obstruction, ileus, inflammation or visible lesion. No free air identified. Lymphatic: No enlarged lymph nodes identified. Reproductive: Prostate is unremarkable. Other: Diffuse soft tissue edema of the right lower extremity causing  significant overall enlargement of the right leg compared to the left. No evidence soft tissue gas or focal abscess in the right lower extremity. No bony destruction or lesion. Musculoskeletal: No acute or significant osseous findings. IMPRESSION: 1. No evidence of significant arterial occlusive disease in the abdomen, pelvis or bilateral lower extremities. 2. Diffuse soft tissue edema of the right lower extremity causing significant overall enlargement of the right leg compared to the left. No evidence of soft tissue gas or focal abscess in the right lower extremity. 3. Indwelling IVC filter within the lower aspect of the IVC. Venous opacification is not adequate to evaluate the IVC or iliac veins for the presence of thrombus. No evidence of extrinsic compression of venous structures in the abdomen or pelvis. Electronically Signed   By: Irish Lack M.D.   On: 06/09/2019 13:40   DG Foot 2 Views Right  Result Date: 06/09/2019 CLINICAL DATA:  Discolored foot.  Right foot pain. EXAM: RIGHT FOOT - 2 VIEW COMPARISON:  08/22/2014 FINDINGS: Remote amputation involving the distal phalanx of the great toe. No acute bony findings or destructive bony changes. Mild to moderate midfoot degenerative changes are noted. No abnormal soft tissue calcifications or gas in the soft tissues. IMPRESSION: 1. Remote amputation involving the great toe. 2. No acute bony findings or destructive bony changes. Electronically Signed   By: Rudie Meyer M.D.   On: 06/09/2019 10:23   VAS [place] LOWER EXTREMITY VENOUS (DVT) (ONLY [facility] 7a-7p)  Result Date: 06/09/2019  Lower Venous Study Indications: Swelling.  Risk Factors: None identified. Limitations: Body  habitus, poor ultrasound/tissue interface, open wound and patient positioning, patient movement, inability supinate. Comparison Study: No prior studies. Performing Technologist: Chanda Busing RVT  Examination Guidelines: A complete evaluation includes B-mode imaging, spectral  Doppler, color Doppler, and power Doppler as needed of all accessible portions of each vessel. Bilateral testing is considered an integral part of a complete examination. Limited examinations for reoccurring indications may be performed as noted.  +---------+---------------+---------+-----------+----------+--------------+ RIGHT    CompressibilityPhasicitySpontaneityPropertiesThrombus Aging +---------+---------------+---------+-----------+----------+--------------+ CFV      None           No       No                   Acute          +---------+---------------+---------+-----------+----------+--------------+ SFJ      None                                         Acute          +---------+---------------+---------+-----------+----------+--------------+ FV Prox  None           No       No                   Acute          +---------+---------------+---------+-----------+----------+--------------+ FV Mid   None           No       No                   Acute          +---------+---------------+---------+-----------+----------+--------------+ FV DistalNone           No       No                   Acute          +---------+---------------+---------+-----------+----------+--------------+ PFV                                                   Not visualized +---------+---------------+---------+-----------+----------+--------------+ POP      None           No       No                   Acute          +---------+---------------+---------+-----------+----------+--------------+ PTV      None                                         Acute          +---------+---------------+---------+-----------+----------+--------------+ PERO                                                  Not visualized +---------+---------------+---------+-----------+----------+--------------+ Unable to assess the CIV or EIV due to the patient's positioning, and inability to supinate.   +----+---------------+---------+-----------+----------+--------------+ LEFTCompressibilityPhasicitySpontaneityPropertiesThrombus Aging +----+---------------+---------+-----------+----------+--------------+ CFV  Not visualized +----+---------------+---------+-----------+----------+--------------+ Unable to assess the CFV due to the patient's positioning, and inability to supinate.    Summary: Right: Findings consistent with acute deep vein thrombosis involving the right common femoral vein, right femoral vein, right popliteal vein, and right posterior tibial veins. No cystic structure found in the popliteal fossa.  *See table(s) above for measurements and observations. Electronically signed by Waverly Ferrari MD on 06/09/2019 at 5:24:04 PM.    Final    EKG: No EKG done on arrival to the ED so will order one now.   Assessment/Plan Active Problems:   DVT (deep venous thrombosis) (HCC)  Acute Right Leg DVT -Admit to inpatient telemetry next-in the setting of missed Eliquis dosing for at least 2 weeks -Patient has been hypercoagulable in the past and has had a history of PE and DVT and has an IVC filter -Was on anticoagulation with Eliquis which we will hold and transition to heparin drip and then transition back to Eliquis once ready for discharge -Lower extremity duplex showed "Findings consistent with acute deep vein thrombosis involving the right common femoral vein, right femoral vein, right popliteal vein, and right posterior tibial veins. No cystic structure found in the popliteal fossa." -Patient has a left BKA and also had an arteriogram and runoff to r/o Arterial Disease given his Dusky foot and Ecchymosis  Right Leg Cellulitis -Patient appears to also have a cellulitis but likely could be erythema from the DVT -Empirically treat with IV cefazolin and narrow antibiotics as necessary -Patient had an elevated leukocytosis and a lactic  acidosis on admission his given IV fluid hydration -CTA AO and BiFem showed "Diffuse soft tissue edema of the right lower extremity causing significant overall enlargement of the right leg compared to the left. No evidence of soft tissue gas or focal abscess in the right lower extremity." -Continue to monitor and trend clinical response to intervention  Diabetic Gastroparesis associated with nonbilious emesis and abdominal cramping -continue with Phenergan also given metoclopramide 10 mg IV in the ED -Continue PPI -Clear liquid diet -Avoid narcotic medications if possible -symptomatic care and may need a gastric emptying study done this admission -If not improving may need to discuss with gastroenterology  Chronic Right Foot Wounds -Has discoloration on the dorsum of his right foot -Had a CT angio of the bifemoral with and without contrast which showed "No evidence of significant arterial occlusive disease in the abdomen, pelvis or bilateral lower extremities. Diffuse soft tissue edema of the right lower extremity causing significant overall enlargement of the right leg compared to the left. No evidence of soft tissue gas or focal abscess in the right lower extremity. Indwelling IVC filter within the lower aspect of the IVC. Venous opacification is not adequate to evaluate the IVC or iliac veins for the presence of thrombus. No evidence of extrinsic compression of venous structures in the abdomen or pelvis." -Will obtain WOC -If necessary will consult Vascular Surgery but do not feel there is much to do from their point as there was no significant arterial disease in bilateral LE   Elevated Anion Gap Acidosis and AKI -Continued with IV fluid hydration with normal saline rate of 125 mL's per hour and will decrease once he gets to the medical floor -Avoid nephrotoxic medications, contrast dyes, hypotension and renally dose medications -Patient BUN/creatinine is 34/1.27 -Continue monitor and  trend renal function and repeat CMP in a.m.  GERD -PPI  History of MI and CAD -Aspirin and clopidogrel listed on MAR but  will hold his clopidogrel As he is also anticoagulated with apixaban -Currently not denies any chest pain -Continue ranolazine and atorvastatin   Obstructive Sleep Apnea -CPAP  Overweight -Estimated body mass index is 29.21 kg/m as calculated from the following:   Height as of 03/02/19:  (1.905 m).   Weight as of 03/02/19: 106 kg. -Weight Loss and Dietary Counseling   Uncontrolled Type 2 Diabetes Mellitus -Uncontrolled -Last hemoglobin A1c was 10.4 -Repeat hemoglobin A1c this visit -Continue with long-acting and will also place on NovoLog sliding scale before meals and at bedtime  Left BKA  GOC: DNR, poA  DVT prophylaxis: Anticoagulated with Heparin gtt Code Status: DO NOT RESUSCITATE  Family Communication: No family present at bedside  Disposition Plan: Pending further clinical Improvement and tolerance of Diet Consults called: None; Discussed case with Medical Oncology  Admission status: Inpatient Telemetry   Severity of Illness: The appropriate patient status for this patient is INPATIENT. Inpatient status is judged to be reasonable and necessary in order to provide the required intensity of service to ensure the patient's safety. The patient's presenting symptoms, physical exam findings, and initial radiographic and laboratory data in the context of their chronic comorbidities is felt to place them at high risk for further clinical deterioration. Furthermore, it is not anticipated that the patient will be medically stable for discharge from the hospital within 2 midnights of admission. The following factors support the patient status of inpatient.   " The patient's presenting symptoms include nausea and abdominal pain along with leg pain. " The worrisome physical exam findings include swollen right extremity " The initial radiographic and laboratory  data are worrisome because of cellulitis " The chronic co-morbidities are as above  * I certify that at the point of admission it is my clinical judgment that the patient will require inpatient hospital care spanning beyond 2 midnights from the point of admission due to high intensity of service, high risk for further deterioration and high frequency of surveillance required.Merlene Laughter, D.O. [facility] is on AMION  If 7PM-7AM, please contact night-coverage [email or web address]  06/09/2019, 6:28 PM

## 2019-06-09 NOTE — ED Triage Notes (Addendum)
EMS reports pt developed rt leg pain in lower with discolor noted, Left AKA, has been nauseated since, appointment at [facility] for Vascular US at 0900, [id number]% RA CBG 166

## 2019-06-09 NOTE — Progress Notes (Signed)
Pt has declined use of CPAP, Pt states that he does not use one at home.  Pt requesting 2 LPM O2 to sleep with.  RT to monitor and assess as needed.

## 2019-06-09 NOTE — Progress Notes (Signed)
Right lower extremity venous duplex has been completed. Preliminary results can be found in CV Proc through chart review.  Results were given to Dr. Zenia Resides.  06/09/19 10:03 AM Samuel [name] RVT

## 2019-06-09 NOTE — ED Notes (Signed)
Pt reported feeling nauseous after PO meds. See [facility]

## 2019-06-09 NOTE — ED Notes (Addendum)
Date and time results received: 06/09/19 1027  Test: Lactic acid Critical Value: 2.9  Name of Provider Notified: Dr. Zenia Resides  Orders Received? Or Actions Taken?: N/a at this time

## 2019-06-09 NOTE — ED Notes (Signed)
Vascular tech at bedside. °

## 2019-06-09 NOTE — ED Notes (Signed)
Pt states he is unable to void at this time. Pt aware urine sample is needed.

## 2019-06-09 NOTE — ED Provider Notes (Signed)
[facility] DEPT Provider Note   CSN: [id number] Arrival date & time: 06/09/19  8242     History Chief Complaint  Patient presents with  . Leg Pain  . Nausea    AHAD COLARUSSO is a 68 y.o. male.  68 year old male with history of diabetes and gastroparesis, status post left lower extremity amputation, presents with nonbilious emesis without fever abdominal cramping notes consistent with prior gastroparesis episodes.  Denies any chest pain or chest pressure.  Not been short of breath.  Patient was scheduled to have a lower extremity Doppler of his right lower extremity today to rule out DVT.  He is on Eliquis.  Denies any trauma to that leg.  Does have chronic wounds to the right foot which are being cared for by home health and he was seen yesterday for this.  Wound was cleansed and dressed according to him.  Has chronic discoloration to the dorsum of his right foot which she says is unchanged.  Denies any active drainage.  Presents via EMS        Past Medical History:  Diagnosis Date  . Anxiety   . Arthritis    "all over"   . CAD (coronary artery disease)   . Charcot's joint    "left foot"  . 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 Depression   . Gastroparesis   . GERD (gastroesophageal reflux disease)   . H/O hiatal hernia   . Hyperlipidemia   . Hypertension   . Myocardial infarction ([place]) 2017/03/27   around this date  . OSA (obstructive sleep apnea)    "not bad enough for a mask"  . Peripheral neuropathy   . Peripheral vascular disease ([place])   . PONV (postoperative nausea and vomiting)   . Pulmonary embolism ([place])    hx. of 2012  . Shortness of breath    exertion  . Type II diabetes mellitus [facility])     Patient Active Problem List   Diagnosis Date Noted  . DKA (diabetic ketoacidoses) ([place]) 10/01/2018  . Gastroparesis 08/17/2018  . Chest pain 08/16/2018  . DKA, type 2 ([place]) 02/03/2018  . DOE (dyspnea on  exertion) 01/24/2018  . Uncontrolled diabetes mellitus ([place]) 09/23/2017  . DKA, type 2, not at goal [facility]) 09/22/2017  . S/P BKA (below knee amputation) ([place]) 04/19/2017  . COPD (chronic obstructive pulmonary disease) ([place]) 04/19/2017  . NSTEMI (non-ST elevated myocardial infarction) ([place]) 04/07/2017  . Osteomyelitis of foot ([place])   . Protein-calorie malnutrition, severe ([place]) 03/08/2015  . Osteomyelitis of foot, acute ([place]) 03/07/2015  . Diabetes mellitus type 2, uncontrolled ([place]) 03/07/2015  . Normocytic normochromic anemia 03/07/2015  . Obesity (BMI 30-39.9) 03/07/2015  . Nausea & vomiting   . DM (diabetes mellitus), type 2 ([place]) 01/07/2014  . Chronic osteomyelitis involving lower leg ([place]) 01/07/2014  . Vomiting 12/22/2013  . Diabetic gastroparesis ([place]) 12/21/2013  . Intractable nausea and vomiting 12/21/2013  . Essential hypertension 05/01/2013  . Hyperlipidemia due to type 2 diabetes mellitus ([place]) 05/01/2013  . Generalized weakness 03/20/2013  . Back pain 03/20/2013  . Pulmonary embolism ([place]) 03/27/2011  . PERFORATION OF GALLBLADDER 08/30/2010  . Ulcer of lower limb, unspecified 03/01/2010  . METHICILLIN SUSCEPTIBLE STAPH AUREUS SEPTICEMIA 01/25/2010  . Acute osteomyelitis, ankle and foot 01/25/2010  . NAUSEA AND VOMITING 01/25/2010  . GERD 11/09/2009  . CAD S/P DES PCI to mLAD: Xience DES 2.75 x 15 (3.0 mm) 05/01/2009  . DIABETES, TYPE 1  05/14/2008  . OBSTRUCTIVE SLEEP APNEA 04/30/2008  . ALLERGIC RHINITIS, SEASONAL 04/30/2008    Past Surgical History:  Procedure Laterality Date  . AMPUTATION Left 03/08/2015   Procedure:  LEFT AMPUTATION BELOW KNEE;  Surgeon: Toni ArthursJohn Hewitt, MD;  Location: [facility] ORS;  Service: Orthopedics;  Laterality: Left;  . APPLICATION OF WOUND VAC Left 01/07/2014  . CHOLECYSTECTOMY  06/2010  . CORONARY ANGIOPLASTY WITH STENT PLACEMENT  05/2009   "1"  . FOOT SURGERY Left 2010   "for Charcot's joint"  . HARDWARE REMOVAL Left 01/07/2014   Procedure:  LEFT LEG REMOVAL OF DEEP IMPLANT AND SEQUESTRECTOMY; APPLICATION OF WOUND VAC ;  Surgeon: Toni ArthursJohn Hewitt, MD;  Location: [facility] OR;  Service: Orthopedics;  Laterality: Left;  . I & D EXTREMITY Left "multiple"   leg  . I & D EXTREMITY Left 01/07/2014   Procedure: 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;  Surgeon: Toni ArthursJohn Hewitt, MD;  Location: [facility] OR;  Service: Orthopedics;  Laterality: Left;  . IM NAILING TIBIA Left ~ 2012  . LEFT HEART CATH AND CORONARY ANGIOGRAPHY N/A 04/08/2017   Procedure: LEFT HEART CATH AND CORONARY ANGIOGRAPHY;  Surgeon: Runell GessBerry, Jonathan J, MD;  Location: [facility] INVASIVE CV LAB;  Service: Cardiovascular;  Laterality: N/A;  . SHOULDER ARTHROSCOPY W/ ROTATOR CUFF REPAIR Left    "and bone spurs"  . TIBIAL IM ROD REMOVAL Left 01/07/2014  . TOE AMPUTATION Right ~ 2011   "great toe"  . VENA CAVA FILTER PLACEMENT  2012  . VENA CAVA FILTER PLACEMENT N/A 11/20/2016   Procedure: INSERTION VENA-CAVA FILTER;  Surgeon: Sherren KernsFields, Charles E, MD;  [facility];  Service: Vascular;  Laterality: N/A;  . WOUND DEBRIDEMENT Left 01/07/2014   "tibia"       Family History  Problem Relation Age of Onset  . COPD Mother   . Heart disease Mother   . Lung cancer Mother   . Retinal detachment Father   . Alcoholism Brother   . Heart disease Brother   . COPD Brother   . Diabetes Brother   . Hypertension Brother   . Stroke Brother     Social History   Tobacco Use  . Smoking status: Never Smoker  . Smokeless tobacco: Never Used  Substance Use Topics  . Alcohol use: No    Comment: 01/07/2014 'might have a wine cooler a couple times/yr"  . Drug use: No    Home Medications Prior to Admission medications   Medication Sig Start Date End Date Taking? Authorizing Provider  acarbose (PRECOSE) 50 MG tablet Take 50 mg by mouth 3 (three) times daily with meals.    [provider]  amLODipine (NORVASC) 10 MG tablet Take 1 tablet (10 mg total) by mouth daily. 11/22/16   Richarda OverlieAbrol, Nayana, MD    apixaban (ELIQUIS) 5 MG TABS tablet Take 1 tablet (5 mg total) by mouth 2 (two) times daily. 09/24/17   Osvaldo ShipperKrishnan, Gokul, MD  aspirin 81 MG EC tablet Take 1 tablet (81 mg total) by mouth daily. 04/12/17   Glade LloydAlekh, Kshitiz, MD  atorvastatin (LIPITOR) 80 MG tablet Take 1 tablet (80 mg total) by mouth daily at 6 PM. 04/11/17   Glade LloydAlekh, Kshitiz, MD  clopidogrel (PLAVIX) 75 MG tablet Take 75 mg by mouth daily.    [provider]  docusate sodium (COLACE) 100 MG capsule Take 100 mg by mouth daily.    [provider]  ferrous sulfate 325 (65 FE) MG tablet Take 325 mg by mouth every evening.  [provider]  furosemide (LASIX) 20 MG tablet Take 1 tablet (20 mg total) by mouth daily. 04/12/17   Glade Lloyd, MD  gabapentin (NEURONTIN) 300 MG capsule Take 300 mg by mouth 3 (three) times daily.     [provider]  Insulin Glargine (BASAGLAR KWIKPEN) 100 UNIT/ML SOPN Inject 40 Units into the skin daily.    Averneni, Carmon Sails, MD  insulin lispro (HUMALOG) 100 UNIT/ML injection Inject 5-18 Units into the skin See admin instructions. units three times daily before meals Sliding Scale 150-250 = 5 units 251-350= 6-8 units 351-450= 10 units Over 450 18 units    Averneni, Madhavi, MD  metFORMIN (GLUCOPHAGE-XR) 500 MG 24 hr tablet Take 500 mg by mouth 2 (two) times daily.     [provider]  metoCLOPramide (REGLAN) 10 MG tablet Take 1 tablet (10 mg total) by mouth every 6 (six) hours for 30 days. Taper back down to your home dose as your symptoms begin to improve. Patient taking differently: Take 5 mg by mouth 3 (three) times daily.  08/21/18 11/12/18  Edsel Petrin, DO  metoprolol tartrate (LOPRESSOR) 25 MG tablet Take 1 tablet (25 mg total) by mouth 2 (two) times daily. 04/11/17   Glade Lloyd, MD  nitroGLYCERIN (NITROSTAT) 0.4 MG SL tablet Place 1 tablet (0.4 mg total) under the tongue every 5 (five) minutes as needed for chest pain. 04/11/17   Glade Lloyd, MD   [facility] VERIO test strip Use to test blood sugar twice daily 09/24/17   [provider]  pantoprazole (PROTONIX) 40 MG tablet Take 1 tablet (40 mg total) by mouth 2 (two) times daily before a meal. 08/21/18   Mikhail, Nita Sells, DO  prednisoLONE acetate (PRED FORTE) 1 % ophthalmic suspension Place 1 drop into the left eye 4 (four) times daily.  07/21/18   [provider]  promethazine (PHENERGAN) 25 MG suppository Place 1 suppository (25 mg total) rectally every 6 (six) hours as needed for nausea or vomiting. 04/18/18   Long, Arlyss Repress, MD  promethazine (PHENERGAN) 25 MG tablet Take 25-50 mg by mouth every 6 (six) hours as needed for nausea or vomiting.  05/05/18   [provider]  ranolazine (RANEXA) 500 MG 12 hr tablet Take 1 tablet (500 mg total) by mouth 2 (two) times daily. 04/11/17   Glade Lloyd, MD  traMADol (ULTRAM) 50 MG tablet Take 100 mg by mouth every 6 (six) hours as needed for moderate pain.     [provider]    Allergies    Gabapentin, Relafen [nabumetone], and Codeine  Review of Systems   Review of Systems  All other systems reviewed and are negative.   Physical Exam Updated Vital Signs BP 112/72 (BP Location: Right Arm)   Pulse (!) 110   Temp 98.6 F (37 C) (Oral)   Resp 18   SpO2 100%   Physical Exam Vitals and nursing note reviewed.  Constitutional:      General: He is not in acute distress.    Appearance: Normal appearance. He is well-developed. He is not toxic-appearing.  HENT:     Head: Normocephalic and atraumatic.  Eyes:     General: Lids are normal.     Conjunctiva/sclera: Conjunctivae normal.     Pupils: Pupils are equal, round, and reactive to light.  Neck:     Thyroid: No thyroid mass.     Trachea: No tracheal deviation.  Cardiovascular:     Rate and Rhythm: Regular rhythm. Tachycardia present.  Heart sounds: Normal heart sounds. No murmur. No gallop.   Pulmonary:     Effort: Pulmonary effort is normal. No  respiratory distress.     Breath sounds: Normal breath sounds. No stridor. No decreased breath sounds, wheezing, rhonchi or rales.  Abdominal:     General: Bowel sounds are normal. There is no distension.     Palpations: Abdomen is soft.     Tenderness: There is no abdominal tenderness. There is no rebound.  Musculoskeletal:        General: No tenderness. Normal range of motion.     Cervical back: Normal range of motion and neck supple.       Legs:  Skin:    General: Skin is warm and dry.     Findings: No abrasion or rash.  Neurological:     Mental Status: He is alert and oriented to person, place, and time.     GCS: GCS eye subscore is 4. GCS verbal subscore is 5. GCS motor subscore is 6.     Cranial Nerves: No cranial nerve deficit.     Sensory: No sensory deficit.  Psychiatric:        Speech: Speech normal.        Behavior: Behavior normal.     ED Results / Procedures / Treatments   Labs (all labs ordered are listed, but only abnormal results are displayed) Labs Reviewed  CULTURE, BLOOD (ROUTINE X 2)  CULTURE, BLOOD (ROUTINE X 2)  LIPASE, BLOOD  COMPREHENSIVE METABOLIC PANEL  CBC  URINALYSIS, ROUTINE W REFLEX MICROSCOPIC  LACTIC ACID, PLASMA    EKG None  Radiology No results found.  Procedures Procedures (including critical care time)  Medications Ordered in ED Medications  sodium chloride flush (NS) 0.9 % injection 3 mL (has no administration in time range)  sodium chloride 0.9 % bolus 1,000 mL (has no administration in time range)  0.9 %  sodium chloride infusion (has no administration in time range)  LORazepam (ATIVAN) injection 0.5 mg (has no administration in time range)  metoCLOPramide (REGLAN) injection 10 mg (has no administration in time range)    ED Course  I have reviewed the triage vital signs and the nursing notes.  Pertinent labs & imaging results that were available during my care of the patient were reviewed by me and considered in my  medical decision making (see chart for details).    MDM Rules/Calculators/A&P                      Patient had an ultrasound of the right lower extremity which was positive for extensive DVT.  Upon further questioning patient states that he was off his Eliquis for several days due to a dental procedure.  He also has an IVC filter in 2.  Patient had a CT angiogram of his right lower extremity due to some discoloration of his toes on the dorsum of his foot.  That was without evidence of arterial occlusion.  Patient is afebrile and elevated lactate noted.  Mild bump in his white count 12.  Blood cultures obtained.  Heparin ordered per pharmacy.  Will admit to the hospital service  CRITICAL CARE Performed by: Toy Baker Total critical care time: 45 minutes Critical care time was exclusive of separately billable procedures and treating other patients. Critical care was necessary to treat or prevent imminent or life-threatening deterioration. Critical care was time spent personally by me on the following activities: development of treatment plan with  patient and/or surrogate as well as nursing, discussions with consultants, evaluation of patient's response to treatment, examination of patient, obtaining history from patient or surrogate, ordering and performing treatments and interventions, ordering and review of laboratory studies, ordering and review of radiographic studies, pulse oximetry and re-evaluation of patient's condition.  Final Clinical Impression(s) / ED Diagnoses Final diagnoses:  None    Rx / DC Orders ED Discharge Orders    None       Lorre Nick, MD 06/09/19 1335

## 2019-06-09 NOTE — ED Notes (Signed)
CT reports that they need a different IV for CT angio. Attempted to stick patient, unsuccessful by this RN. Will reach out for [place] IV.

## 2019-06-10 ENCOUNTER — Other Ambulatory Visit: Payer: Self-pay

## 2019-06-10 DIAGNOSIS — E1169 Type 2 diabetes mellitus with other specified complication: Secondary | ICD-10-CM

## 2019-06-10 DIAGNOSIS — K3184 Gastroparesis: Secondary | ICD-10-CM

## 2019-06-10 DIAGNOSIS — E1143 Type 2 diabetes mellitus with diabetic autonomic (poly)neuropathy: Secondary | ICD-10-CM

## 2019-06-10 DIAGNOSIS — Z794 Long term (current) use of insulin: Secondary | ICD-10-CM

## 2019-06-10 DIAGNOSIS — I824Y1 Acute embolism and thrombosis of unspecified deep veins of right proximal lower extremity: Secondary | ICD-10-CM

## 2019-06-10 DIAGNOSIS — I2510 Atherosclerotic heart disease of native coronary artery without angina pectoris: Secondary | ICD-10-CM

## 2019-06-10 LAB — COMPREHENSIVE METABOLIC PANEL
ALT: 15 U/L (ref 0–44)
AST: 21 U/L (ref 15–41)
Albumin: 2.8 g/dL — ABNORMAL LOW (ref 3.5–5.0)
Alkaline Phosphatase: 63 U/L (ref 38–126)
Anion gap: 11 (ref 5–15)
BUN: 30 mg/dL — ABNORMAL HIGH (ref 8–23)
CO2: 23 mmol/L (ref 22–32)
Calcium: 8.3 mg/dL — ABNORMAL LOW (ref 8.9–10.3)
Chloride: 103 mmol/L (ref 98–111)
Creatinine, Ser: 0.91 mg/dL (ref 0.61–1.24)
GFR calc Af Amer: >60 mL/min (ref >60)
GFR calc non Af Amer: >60 mL/min (ref >60)
Glucose, Bld: 195 mg/dL — ABNORMAL HIGH (ref 70–99)
Potassium: 3.6 mmol/L (ref 3.5–5.1)
Sodium: 137 mmol/L (ref 135–145)
Total Bilirubin: 0.7 mg/dL (ref 0.3–1.2)
Total Protein: 6.5 g/dL (ref 6.5–8.1)

## 2019-06-10 LAB — CBC WITH DIFFERENTIAL/PLATELET
Abs Immature Granulocytes: 0.03 10*3/uL (ref 0.00–0.07)
Basophils Absolute: 0 10*3/uL (ref 0.0–0.1)
Basophils Relative: 0 %
Eosinophils Absolute: 0.3 10*3/uL (ref 0.0–0.5)
Eosinophils Relative: 3 %
HCT: 37.4 % — ABNORMAL LOW (ref 39.0–52.0)
Hemoglobin: 12 g/dL — ABNORMAL LOW (ref 13.0–17.0)
Immature Granulocytes: 0 %
Lymphocytes Relative: 20 %
Lymphs Abs: 1.7 10*3/uL (ref 0.7–4.0)
MCH: 30.7 pg (ref 26.0–34.0)
MCHC: 32.1 g/dL (ref 30.0–36.0)
MCV: 95.7 fL (ref 80.0–100.0)
Monocytes Absolute: 0.7 10*3/uL (ref 0.1–1.0)
Monocytes Relative: 8 %
Neutro Abs: 5.8 10*3/uL (ref 1.7–7.7)
Neutrophils Relative %: 69 %
Platelets: 186 10*3/uL (ref 150–400)
RBC: 3.91 MIL/uL — ABNORMAL LOW (ref 4.22–5.81)
RDW: 13.9 % (ref 11.5–15.5)
WBC: 8.5 10*3/uL (ref 4.0–10.5)
nRBC: 0 % (ref 0.0–0.2)

## 2019-06-10 LAB — GLUCOSE, CAPILLARY: Glucose-Capillary: 254 mg/dL — ABNORMAL HIGH (ref 70–99)

## 2019-06-10 LAB — HEMOGLOBIN A1C
Hgb A1c MFr Bld: 12.2 % — ABNORMAL HIGH (ref 4.8–5.6)
Hgb A1c MFr Bld: 12.1 % — ABNORMAL HIGH (ref 4.8–5.6)
Mean Plasma Glucose: 303.44 mg/dL
Mean Plasma Glucose: 300.57 mg/dL

## 2019-06-10 LAB — MAGNESIUM: Magnesium: 2.1 mg/dL (ref 1.7–2.4)

## 2019-06-10 LAB — CBG MONITORING, ED
Glucose-Capillary: 177 mg/dL — ABNORMAL HIGH (ref 70–99)
Glucose-Capillary: 163 mg/dL — ABNORMAL HIGH (ref 70–99)
Glucose-Capillary: 146 mg/dL — ABNORMAL HIGH (ref 70–99)

## 2019-06-10 LAB — HIV ANTIBODY (ROUTINE TESTING W REFLEX): HIV Screen 4th Generation wRfx: NONREACTIVE

## 2019-06-10 LAB — HEPARIN LEVEL (UNFRACTIONATED)
Heparin Unfractionated: 0.67 IU/mL (ref 0.30–0.70)
Heparin Unfractionated: 0.57 IU/mL (ref 0.30–0.70)

## 2019-06-10 LAB — PHOSPHORUS: Phosphorus: 2.6 mg/dL (ref 2.5–4.6)

## 2019-06-10 MED ORDER — HEPARIN (PORCINE) 25000 UT/250ML-% IV SOLN
1650.0000 [IU]/h | INTRAVENOUS | Status: DC
  Administered 2019-06-10: 1650 [IU]/h via INTRAVENOUS
  Filled 2019-06-10 (×2): qty 250

## 2019-06-10 NOTE — ED Notes (Signed)
Pt requesting to hold 1000 medications d/t ongoing nausea. This RN explained to pt that PRN nausea medication could not be administered again until 1200 d/t previous administration.  Pt understanding of this.  This RN informed pt that 1000 meds will be held until nausea can be resolved.  Pt agreeable to this plan.

## 2019-06-10 NOTE — ED Notes (Signed)
Pt provided with lunch tray.

## 2019-06-10 NOTE — ED Notes (Signed)
Pt c/o nausea. PRN medication given

## 2019-06-10 NOTE — Progress Notes (Addendum)
ANTICOAGULATION CONSULT NOTE - Follow Up Consult  Pharmacy Consult for heparin Indication: hx DVT/PE, new acute DVT   Allergies  Allergen Reactions  . Gabapentin Hives, Itching, Nausea And Vomiting and Rash    REACTION: rash/hives (per patient, was a reaction to an inactive ingredient in another mgf brand). The patient stated that he does take this medication now and it doesn't cause a rash any more.  . Relafen [Nabumetone] Nausea And Vomiting and Rash  . Codeine Nausea And Vomiting and Rash    Patient Measurements: Height: 6\' 3"  (190.5 cm) Weight: 230 lb (104.3 kg) IBW/kg (Calculated) : 84.5 Heparin Dosing Weight: 104 kg  Vital Signs: BP: 120/70 (12/23 0658) Pulse Rate: 80 (12/23 0658)  Labs: Recent Labs    06/09/19 0935 06/09/19 1351 06/09/19 1948 06/09/19 2303 06/10/19 0551  HGB 13.5  --   --   --  12.0*  HCT 41.1  --   --   --  37.4*  PLT 198  --   --   --  186  APTT  --  33  --   --   --   LABPROT  --  13.8  --   --   --   INR  --  1.1  --   --   --   HEPARINUNFRC  --  <0.10* 1.32* 0.62 0.67  CREATININE 1.27*  --   --   --  0.91    Estimated Creatinine Clearance: 104.4 mL/min (by C-G formula based on SCr of 0.91 mg/dL).   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  She reported being off Eliquis for several days PTA for an upcoming dental procedure.  LE doppler was positive for acute right LE DVT.  Heparin drip was started on adimssion for acute DVT.  Today, 06/10/2019: - heparin level is therapeutic but at upper end of therapeutic range with  0.67 - hgb down slightly, plts 186k - no bleeding documented  Goal of Therapy:  Heparin level 0.3-0.7 units/ml Monitor platelets by anticoagulation protocol: Yes   Plan:  - Decrease heparin drip slightly to 1650 units/hr -  check 6 hr level - monitor for s/s bleeding  Aveen Stansel P 06/10/2019,7:12 AM __________________________  Adden: Heparin level now back therapeutic at 0.57 with current rate of 1650 units/hr. Continue with current regimen. F/u with AM labs and adjust dose if needed  Dia Sitter, PharmD, BCPS 06/10/2019 2:45 PM

## 2019-06-10 NOTE — ED Notes (Signed)
Pt was removed from bedpan, peri-care provided, new brief applied, new chucks applied, new bedsheet placed, pt repositioned.

## 2019-06-10 NOTE — ED Notes (Signed)
When Probation officer of this note entered the room, writer observed right wrist 20G IV was out. IV sited dressed, area clean and dry.

## 2019-06-10 NOTE — Progress Notes (Signed)
PROGRESS NOTE  Samuel [name] WYO:[id number] DOB: 1951-04-02 DOA: 06/09/2019 PCP: [name], MD  HPI/Recap of past 24 hours: HPI from Dr Sloan Leiter is a 68 y.o. male with medical history significant for but not limited too with gastroparesis, left lower leg amputation, CAD, depression and anxiety, GERD, history of MI, hypertension, OSA, as well as other comorbidities who presents with chief complaint of vomiting and nausea with abdominal pain which is consistent with his prior gastroparesis episodes. Reports some worsening right lower extremity pain and increased swelling as well as redness. 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  Of note he has been on Eliquis for history of DVT and PE but stopped it for last 2 weeks himself due to him anticipating a dental procedure which is still not been scheduled or done yet. 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  Evaluation showed that he had an acute DVT and cellulitis.  TR[facility] was called admit this patient for cellulitis and DVT.    Today, patient still reports significant nausea with dry heaving, denied any vomiting.  Denies any chest pain, shortness of breath, abdominal pain, diarrhea.    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, noncompliant (held Eliquis due to possible unscheduled dental procedure) Lower extremity Doppler showed DVT in the right common femoral, popliteal, posterior tibial veins Continue IV heparin, plan to switch back to Eliquis Monitor closely  Possible right leg cellulitis/chronic wounds Noted to have multiple chronic wounds Currently afebrile, with resolved leukocytosis CTA lower extremity showed diffuse soft tissue edema, no evidence of soft tissue gas or focal abscess Continue IV cefazolin WOC consult  Diabetic  gastroparesis flare None history of gastroparesis Symptomatic management for now, continue Phenergan, PPI Clear liquid diet for now  Uncontrolled type 2 diabetes mellitus Last A1c was 12.1 SSI, Lantus, Accu-Cheks, hypoglycemic protocol  History of MI/CAD Currently chest pain-free Continue IV heparin as above Continue home ranolazine, atorvastatin, hold home Plavix, ASA  OSA Continue CPAP  Left BKA Stable       Malnutrition Type:      Malnutrition Characteristics:      Nutrition Interventions:       Estimated body mass index is 28.75 kg/m as calculated from the following:   Height as of this encounter: 6\' 3"  (1.905 m).   Weight as of this encounter: 104.3 kg.     Code Status: DNR  Family Communication: None at bedside  Disposition Plan: To be determined   Consultants:  None  Procedures:  None  Antimicrobials:  IV cefazolin  DVT prophylaxis: Continue IV Heparin   Objective: Vitals:   06/10/19 0830 06/10/19 0930 06/10/19 1000 06/10/19 1100  BP: 135/78 131/73 131/73 135/71  Pulse: 87 87 91 86  Resp: 19 17  19   Temp:      TempSrc:      SpO2: 97% 95% 95% 94%  Weight:      Height:        Intake/Output Summary (Last 24 hours) at 06/10/2019 1158 Last data filed at 06/10/2019 0930 Gross per 24 hour  Intake 3387.5 ml  Output --  Net 3387.5 ml   Filed Weights   06/09/19 2218  Weight: 104.3 kg    Exam:  General: NAD   Cardiovascular: S1, S2 present  Respiratory:  Diminished breath sounds bilaterally  Abdomen: Soft, nontender, nondistended, bowel sounds present  Musculoskeletal: 1+ RLE pedal edema noted  Skin:  Right lower leg erythema, with right foot wound  Psychiatry: Normal mood   Data Reviewed: CBC: Recent Labs  Lab 06/09/19 0935 06/10/19 0551  WBC 12.0* 8.5  NEUTROABS  --  5.8  HGB 13.5 12.0*  HCT 41.1 37.4*  MCV 93.4 95.7  PLT 198 186   Basic Metabolic Panel: Recent Labs  Lab 06/09/19 0935  06/10/19 0551  NA 137 137  K 4.1 3.6  CL 99 103  CO2 21* 23  GLUCOSE 212* 195*  BUN 34* 30*  CREATININE 1.27* 0.91  CALCIUM 9.3 8.3*  MG  --  2.1  PHOS  --  2.6   GFR: Estimated Creatinine Clearance: 104.4 mL/min (by C-G formula based on SCr of 0.91 mg/dL). Liver Function Tests: Recent Labs  Lab 06/09/19 0935 06/10/19 0551  AST 28 21  ALT 17 15  ALKPHOS 79 63  BILITOT 1.2 0.7  PROT 7.9 6.5  ALBUMIN 3.5 2.8*   Recent Labs  Lab 06/09/19 0935  LIPASE 15   No results for input(s): AMMONIA in the last 168 hours. Coagulation Profile: Recent Labs  Lab 06/09/19 1351  INR 1.1   Cardiac Enzymes: No results for input(s): CKTOTAL, CKMB, CKMBINDEX, TROPONINI in the last 168 hours. BNP (last 3 results) No results for input(s): PROBNP in the last 8760 hours. HbA1C: Recent Labs    06/09/19 1948 06/10/19 0551  HGBA1C 12.2* 12.1*   CBG: Recent Labs  Lab 06/09/19 2147 06/10/19 0744 06/10/19 1101  GLUCAP 256* 177* 163*   Lipid Profile: No results for input(s): CHOL, HDL, LDLCALC, TRIG, CHOLHDL, LDLDIRECT in the last 72 hours. Thyroid Function Tests: Recent Labs    06/09/19 1948  TSH 0.561   Anemia Panel: No results for input(s): VITAMINB12, FOLATE, FERRITIN, TIBC, IRON, RETICCTPCT in the last 72 hours. Urine analysis:    Component Value Date/Time   COLORURINE YELLOW 06/09/2019 1659   APPEARANCEUR CLEAR 06/09/2019 1659   LABSPEC >1.046 (H) 06/09/2019 1659   PHURINE 5.0 06/09/2019 1659   GLUCOSEU >=500 (A) 06/09/2019 1659   HGBUR NEGATIVE 06/09/2019 1659   BILIRUBINUR NEGATIVE 06/09/2019 1659   KETONESUR 20 (A) 06/09/2019 1659   PROTEINUR 30 (A) 06/09/2019 1659   UROBILINOGEN 1.0 04/05/2015 1155   NITRITE NEGATIVE 06/09/2019 1659   LEUKOCYTESUR NEGATIVE 06/09/2019 1659   Sepsis Labs: @LABRCNTIP (procalcitonin:4,lacticidven:4)  ) Recent Results (from the past 240 hour(s))  blood cx x2     Status: None (Preliminary result)   Collection Time: 06/09/19   9:35 AM   Specimen: BLOOD  Result Value Ref Range Status   Specimen Description   Final    BLOOD RIGHT WRIST Performed at [facility], [address] [place], [place] [address]    Special Requests   Final    BOTTLES DRAWN AEROBIC AND ANAEROBIC Blood Culture adequate volume   Culture   Final    NO GROWTH < 24 HOURS Performed at [facility], [address] [place], [place] [address]    Report Status PENDING  Incomplete  blood cx x2     Status: None (Preliminary result)   Collection Time: 06/09/19  9:35 AM   Specimen: BLOOD RIGHT HAND  Result Value Ref Range Status   Specimen Description   Final    BLOOD RIGHT HAND Performed at [facility], [address] [place], [place]  [address]    Special Requests   Final    BOTTLES DRAWN AEROBIC AND ANAEROBIC Blood Culture adequate volume   Culture   Final    NO GROWTH < 24 HOURS Performed at [facility], [address] [place], [place] [address]    Report Status PENDING  Incomplete  SARS CORONAVIRUS 2 (TAT 6-24 HRS) Nasopharyngeal Nasopharyngeal Swab     Status: None   Collection Time: 06/09/19  1:51 PM   Specimen: Nasopharyngeal Swab  Result Value Ref Range Status   SARS Coronavirus 2 NEGATIVE NEGATIVE Final    Comment: (NOTE) SARS-CoV-2 target nucleic acids are NOT DETECTED. The SARS-CoV-2 RNA is generally detectable in upper and lower respiratory specimens during the acute phase of infection. Negative results do not preclude SARS-CoV-2 infection, do not rule out co-infections with other pathogens, and should not be used as the sole basis for treatment or other patient management decisions. Negative results must be combined with clinical observations, patient history, and epidemiological information. The expected result is Negative. Fact Sheet for Patients: HairSlick.no Fact Sheet for Healthcare  Providers: quierodirigir.com This test is not yet approved or cleared by the [place] FDA and  has been authorized for detection and/or diagnosis of SARS-CoV-2 by FDA under an Emergency Use Authorization (EUA). This EUA will remain  in effect (meaning this test can be used) for the duration of the COVID-19 declaration under Section 56 4(b)(1) of the Act, 21 U.S.C. section 360bbb-3(b)(1), unless the authorization is terminated or revoked sooner. Performed at [facility], [address], [place], [place] [address]       Studies: CT ANGIO AO+BIFEM W & OR WO CONTRAST  Result Date: 06/09/2019 CLINICAL DATA:  Extensive right lower extremity DVT from the level of the common femoral vein to the calf veins. Discoloration and foul-smelling right lower extremity. Prior below-knee amputation of the left lower extremity. EXAM: CT ANGIOGRAPHY OF ABDOMINAL AORTA WITH ILIOFEMORAL RUNOFF TECHNIQUE: Multidetector CT imaging of the abdomen, pelvis and lower extremities was performed using the standard protocol during bolus administration of intravenous contrast. Multiplanar CT image reconstructions and MIPs were obtained to evaluate the vascular anatomy. CONTRAST:  OMNIPAQUE IOHEXOL 350 MG/ML SOLN COMPARISON:  CT of the abdomen and pelvis on 08/17/2018 FINDINGS: VASCULAR Aorta: The abdominal aorta is normally patent and demonstrates mild scattered calcified plaque. No evidence of aneurysmal disease or dissection. Celiac: Mild plaque at the origin of the celiac axis without significant stenosis. Normal branch vessel anatomy and patency. SMA: Mild calcified plaque at the origin of the SMA without significant stenosis. Normally patent visualized branch vessels. Renals: Bilateral single renal arteries demonstrate no significant stenosis. IMA: Normally patent. Venous: Indwelling IVC filter within the lower aspect of the IVC. Venous opacification is not adequate to evaluate the  IVC or iliac veins for the presence of thrombus. No asymmetry in caliber of the iliac veins to suggest obvious distension from thrombus. No evidence of extrinsic compression of venous structures in the abdomen or pelvis. RIGHT Lower Extremity Inflow: Normally patent right-sided iliac arteries. Outflow: Common femoral artery and femoral bifurcation are widely patent. Profunda femoral artery and superficial femoral artery demonstrate mild scattered plaque without evidence of significant stenosis. Runoff: Normally patent popliteal artery. Three-vessel runoff below the knee with dominant anterior tibial runoff which appears patent into the foot. The posterior tibial artery is diffusely small in caliber. The peroneal artery shows scattered plaque but appears to be patent at least to the level of the  ankle. LEFT Lower Extremity Inflow: Normally patent left-sided iliac arteries. Outflow: Common femoral artery and femoral bifurcation are widely patent. Profunda femoral artery and superficial femoral artery demonstrate mild scattered plaque without evidence of significant stenosis. Runoff: Above knee popliteal artery is normally patent. Below-knee amputation present. Review of the MIP images confirms the above findings. NON-VASCULAR Lower chest: No acute abnormality. Hepatobiliary: Probable hepatic steatosis. No focal lesion identified. Status post cholecystectomy. No biliary dilatation. Pancreas: Diffuse atrophy and fatty replacement of the pancreatic parenchyma. Spleen: Normal in size without focal abnormality. Adrenals/Urinary Tract: No adrenal masses. Stable left upper pole renal cyst. No hydronephrosis. The bladder is unremarkable. Stomach/Bowel: Bowel shows no evidence of obstruction, ileus, inflammation or visible lesion. No free air identified. Lymphatic: No enlarged lymph nodes identified. Reproductive: Prostate is unremarkable. Other: Diffuse soft tissue edema of the right lower extremity causing significant  overall enlargement of the right leg compared to the left. No evidence soft tissue gas or focal abscess in the right lower extremity. No bony destruction or lesion. Musculoskeletal: No acute or significant osseous findings. IMPRESSION: 1. No evidence of significant arterial occlusive disease in the abdomen, pelvis or bilateral lower extremities. 2. Diffuse soft tissue edema of the right lower extremity causing significant overall enlargement of the right leg compared to the left. No evidence of soft tissue gas or focal abscess in the right lower extremity. 3. Indwelling IVC filter within the lower aspect of the IVC. Venous opacification is not adequate to evaluate the IVC or iliac veins for the presence of thrombus. No evidence of extrinsic compression of venous structures in the abdomen or pelvis. Electronically Signed   By: Aletta Edouard M.D.   On: 06/09/2019 13:40    Scheduled Meds: . amLODipine  10 mg Oral Daily  . aspirin EC  81 mg Oral Daily  . atorvastatin  80 mg Oral q1800  . docusate sodium  100 mg Oral Daily  . ferrous sulfate  325 mg Oral QPM  . insulin aspart  0-15 Units Subcutaneous TID WC  . insulin aspart  0-5 Units Subcutaneous QHS  . insulin glargine  40 Units Subcutaneous QHS  . metoprolol tartrate  25 mg Oral BID  . pantoprazole  40 mg Oral BID AC  . ranolazine  500 mg Oral BID    Continuous Infusions: .  ceFAZolin (ANCEF) IV Stopped (06/10/19 3662)  . heparin 1,650 Units/hr (06/10/19 0746)     LOS: 1 day     Alma Friendly, MD [facility]  If 7PM-7AM, please contact night-coverage [email or web address] 06/10/2019, 11:58 AM

## 2019-06-10 NOTE — ED Notes (Signed)
Family at bedside. 

## 2019-06-10 NOTE — Progress Notes (Signed)
ANTICOAGULATION CONSULT NOTE -  Consult  Pharmacy Consult for Heparin Indication: DVT (with a history of PE)  Allergies  Allergen Reactions  . Gabapentin Hives, Itching, Nausea And Vomiting and Rash    REACTION: rash/hives (per patient, was a reaction to an inactive ingredient in another mgf brand). The patient stated that he does take this medication now and it doesn't cause a rash any more.  . Relafen [Nabumetone] Nausea And Vomiting and Rash  . Codeine Nausea And Vomiting and Rash    Patient Measurements: Height: 6\' 3"  (190.5 cm) Weight: 230 lb (104.3 kg) IBW/kg (Calculated) : 84.5  Last documented weight = 106kg (03/02/19) Heparin Dosing Weight: actual body weight  Vital Signs: BP: 152/90 (12/22 2214) Pulse Rate: 106 (12/22 2200)  Labs: Recent Labs    06/09/19 0935 06/09/19 1351 06/09/19 1948 06/09/19 2303  HGB 13.5  --   --   --   HCT 41.1  --   --   --   PLT 198  --   --   --   APTT  --  33  --   --   LABPROT  --  13.8  --   --   INR  --  1.1  --   --   HEPARINUNFRC  --  <0.10* 1.32* 0.62  CREATININE 1.27*  --   --   --     Estimated Creatinine Clearance: 74.8 mL/min (A) (by C-G formula based on SCr of 1.27 mg/dL (H)).   Medical History: Past Medical History:  Diagnosis Date  . Anxiety   . Arthritis    "all over"   . CAD (coronary artery disease)   . Charcot's joint    "left foot"  . 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 Depression   . Gastroparesis   . GERD (gastroesophageal reflux disease)   . H/O hiatal hernia   . Hyperlipidemia   . Hypertension   . Myocardial infarction (HCC) 2017/03/27   around this date  . OSA (obstructive sleep apnea)    "not bad enough for a mask"  . Peripheral neuropathy   . Peripheral vascular disease (HCC)   . PONV (postoperative nausea and vomiting)   . Pulmonary embolism (HCC)    hx. of 2012  . Shortness of breath    exertion  . Type II diabetes mellitus (HCC)     Medications:  PTA  Eliquis 5mg  BID   Assessment:  68 yr male presents to ED with right lower leg pain.  PMH significant for left AKA, PE (on Eliquis), DM, gastroparesis, +IVC filter  Vascular ultrasound = + extensive DVT in right lower extremity  Last reported Eliquis dose = 06/07/19 @ 20:00  Per ED provider note, patient had been off Eliquis for several days due to a dental procedure  Pharmacy consulted to dose IV heparin   Eliquis can falsely elevate heparin level.  Will monitor heparin therapy with both heparin level and aPTT until levels correlate   Today, 12/22 2303 HL = 0.62 at goal, no bleeding or IV issues per RN. Note: 1948 HL was 1.32 but confirmed with RN was drawn from same side has heparin so the level was redrawn appropriately .   Goal of Therapy:  Heparin level 0.3-0.7 units/ml aPTT 66-102 seconds Monitor platelets by anticoagulation protocol: Yes   Plan:   Continue heparin drip at 1700 units/hr  Recheck HL with am labs to confirm  Updated weight in Epic ordered and will  adjust heparin dosing as needed once actual weight documented.  Follow heparin level and CBC daily while on heparin   Dorrene German, PharmD 06/10/2019,12:02 AM

## 2019-06-10 NOTE — ED Notes (Addendum)
Pt assisted to transfer to hospital bed.  Call bell within reach.  No needs expressed at this time.  Will continue to monitor.

## 2019-06-10 NOTE — ED Notes (Addendum)
Pt was transferred to hospital bed, pt provided clean linens and chuck pad, pt repositioned, by RN & EMT.

## 2019-06-10 NOTE — Progress Notes (Signed)
Pt refuses CPAP qhs.  Pt states that he does not wear one at home and does not want to wear one here.  Pt encouraged to contact RT should he change his mind.

## 2019-06-11 LAB — CBC
HCT: 36.7 % — ABNORMAL LOW (ref 39.0–52.0)
Hemoglobin: 11.6 g/dL — ABNORMAL LOW (ref 13.0–17.0)
MCH: 30.4 pg (ref 26.0–34.0)
MCHC: 31.6 g/dL (ref 30.0–36.0)
MCV: 96.1 fL (ref 80.0–100.0)
Platelets: 204 10*3/uL (ref 150–400)
RBC: 3.82 MIL/uL — ABNORMAL LOW (ref 4.22–5.81)
RDW: 13.7 % (ref 11.5–15.5)
WBC: 5.8 10*3/uL (ref 4.0–10.5)
nRBC: 0 % (ref 0.0–0.2)

## 2019-06-11 LAB — GLUCOSE, CAPILLARY
Glucose-Capillary: 140 mg/dL — ABNORMAL HIGH (ref 70–99)
Glucose-Capillary: 117 mg/dL — ABNORMAL HIGH (ref 70–99)
Glucose-Capillary: 129 mg/dL — ABNORMAL HIGH (ref 70–99)
Glucose-Capillary: 201 mg/dL — ABNORMAL HIGH (ref 70–99)

## 2019-06-11 LAB — BASIC METABOLIC PANEL
Anion gap: 9 (ref 5–15)
BUN: 24 mg/dL — ABNORMAL HIGH (ref 8–23)
CO2: 24 mmol/L (ref 22–32)
Calcium: 8.5 mg/dL — ABNORMAL LOW (ref 8.9–10.3)
Chloride: 103 mmol/L (ref 98–111)
Creatinine, Ser: 0.92 mg/dL (ref 0.61–1.24)
GFR calc Af Amer: >60 mL/min (ref >60)
GFR calc non Af Amer: >60 mL/min (ref >60)
Glucose, Bld: 139 mg/dL — ABNORMAL HIGH (ref 70–99)
Potassium: 3.5 mmol/L (ref 3.5–5.1)
Sodium: 136 mmol/L (ref 135–145)

## 2019-06-11 LAB — HEPARIN LEVEL (UNFRACTIONATED)
Heparin Unfractionated: 0.85 IU/mL — ABNORMAL HIGH (ref 0.30–0.70)
Heparin Unfractionated: 0.71 IU/mL — ABNORMAL HIGH (ref 0.30–0.70)
Heparin Unfractionated: 0.47 IU/mL (ref 0.30–0.70)

## 2019-06-11 MED ORDER — HEPARIN (PORCINE) 25000 UT/250ML-% IV SOLN
1450.0000 [IU]/h | INTRAVENOUS | Status: DC
  Filled 2019-06-11: qty 250

## 2019-06-11 MED ORDER — APIXABAN 5 MG PO TABS: 10.0000 mg | ORAL_TABLET | Freq: Two times a day (BID) | ORAL | Status: DC

## 2019-06-11 MED ORDER — APIXABAN 5 MG PO TABS: 5.0000 mg | ORAL_TABLET | Freq: Two times a day (BID) | ORAL | Status: DC

## 2019-06-11 MED ORDER — HEPARIN (PORCINE) 25000 UT/250ML-% IV SOLN
1350.0000 [IU]/h | INTRAVENOUS | Status: AC
  Administered 2019-06-12: 1350 [IU]/h via INTRAVENOUS
  Filled 2019-06-11 (×2): qty 250

## 2019-06-11 MED ORDER — PROMETHAZINE HCL 25 MG/ML IJ SOLN
12.5000 mg | Freq: Four times a day (QID) | INTRAMUSCULAR | Status: DC | PRN
  Administered 2019-06-11 – 2019-06-12 (×6): 25 mg via INTRAMUSCULAR
  Filled 2019-06-11 (×6): qty 1

## 2019-06-11 MED ORDER — PROMETHAZINE HCL 25 MG/ML IJ SOLN: 12.5000 mg | INTRAMUSCULAR | Status: DC | PRN

## 2019-06-11 NOTE — Progress Notes (Signed)
PROGRESS NOTE  Samuel [name] RVI:[id number] DOB: [birth year] DOA: 06/09/2019 PCP: [name], MD  HPI/Recap of past 24 hours: HPI from Dr Sloan Leiter is a 68 y.o. male with medical history significant for but not limited too with gastroparesis, left lower leg amputation, CAD, depression and anxiety, GERD, history of MI, hypertension, OSA, as well as other comorbidities who presents with chief complaint of vomiting and nausea with abdominal pain which is consistent with his prior gastroparesis episodes. Reports some worsening right lower extremity pain and increased swelling as well as redness. 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  Of note he has been on Eliquis for history of DVT and PE but stopped it for last 2 weeks himself due to him anticipating a dental procedure which is still not been scheduled or done yet. 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  Evaluation showed that he had an acute DVT and cellulitis.  TRH was called admit this patient for cellulitis and DVT.    Today, patient still reports persistent nausea, worried about eating and throwing up.  Declines to have anything p.o.  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, noncompliant (held Eliquis due to possible unscheduled dental procedure) Lower extremity Doppler showed DVT in the right common femoral, popliteal, posterior tibial veins Continue IV heparin, plan to switch back to Eliquis once able to tolerate Monitor closely  Possible right leg cellulitis/chronic wounds Noted to have multiple chronic wounds Currently afebrile, with resolved leukocytosis CTA lower extremity showed diffuse soft tissue edema, no evidence of soft tissue gas or focal abscess Continue IV cefazolin  Diabetic  gastroparesis flare Known history of gastroparesis Symptomatic management for now, continue home IM Phenergan, PPI Full liquid diet for now  Uncontrolled type 2 diabetes mellitus Last A1c was 12.1 SSI, Lantus, Accu-Cheks, hypoglycemic protocol  History of MI/CAD Currently chest pain-free Continue IV heparin as above Continue home ranolazine, atorvastatin, hold home Plavix, ASA  OSA Continue CPAP  Left BKA Stable       Malnutrition Type:      Malnutrition Characteristics:      Nutrition Interventions:       Estimated body mass index is 28.44 kg/m as calculated from the following:   Height as of this encounter: 6\' 3"  (1.905 m).   Weight as of this encounter: 103.2 kg.     Code Status: DNR  Family Communication: None at bedside  Disposition Plan: To be determined   Consultants:  None  Procedures:  None  Antimicrobials:  IV cefazolin  DVT prophylaxis: Continue IV Heparin   Objective: Vitals:   06/10/19 2012 06/11/19 0500 06/11/19 0534 06/11/19 1439  BP: 123/66  124/72 124/76  Pulse: 82  89 90  Resp: 18  17 19   Temp: 98 F (36.7 C)  98.6 F (37 C) 99.1 F (37.3 C)  TempSrc: Oral  Oral Oral  SpO2: 94%  95% 98%  Weight:  103.2 kg    Height:        Intake/Output Summary (Last 24 hours) at 06/11/2019 1743 Last data filed at 06/11/2019 0534 Gross per 24 hour  Intake 1141.61 ml  Output 450 ml  Net 691.61 ml   Filed Weights   06/09/19 2218 06/11/19 0500  Weight: 104.3 kg 103.2 kg    Exam:  General: NAD   Cardiovascular: S1, S2 present  Respiratory:  Diminished breath sounds bilaterally  Abdomen: Soft, nontender, nondistended, bowel sounds present  Musculoskeletal: 1+ RLE pedal edema noted  Skin:  Right lower leg erythema, with right foot wound  Psychiatry: Normal mood  Data Reviewed: CBC: Recent Labs  Lab 06/09/19 0935 06/10/19 0551 06/11/19 0603  WBC 12.0* 8.5 5.8  NEUTROABS  --  5.8  --   HGB 13.5 12.0*  11.6*  HCT 41.1 37.4* 36.7*  MCV 93.4 95.7 96.1  PLT 198 186 673   Basic Metabolic Panel: Recent Labs  Lab 06/09/19 0935 06/10/19 0551 06/11/19 0603  NA 137 137 136  K 4.1 3.6 3.5  CL 99 103 103  CO2 21* 23 24  GLUCOSE 212* 195* 139*  BUN 34* 30* 24*  CREATININE 1.27* 0.91 0.92  CALCIUM 9.3 8.3* 8.5*  MG  --  2.1  --   PHOS  --  2.6  --    GFR: Estimated Creatinine Clearance: 102.8 mL/min (by C-G formula based on SCr of 0.92 mg/dL). Liver Function Tests: Recent Labs  Lab 06/09/19 0935 06/10/19 0551  AST 28 21  ALT 17 15  ALKPHOS 79 63  BILITOT 1.2 0.7  PROT 7.9 6.5  ALBUMIN 3.5 2.8*   Recent Labs  Lab 06/09/19 0935  LIPASE 15   No results for input(s): AMMONIA in the last 168 hours. Coagulation Profile: Recent Labs  Lab 06/09/19 1351  INR 1.1   Cardiac Enzymes: No results for input(s): CKTOTAL, CKMB, CKMBINDEX, TROPONINI in the last 168 hours. BNP (last 3 results) No results for input(s): PROBNP in the last 8760 hours. HbA1C: Recent Labs    06/09/19 1948 06/10/19 0551  HGBA1C 12.2* 12.1*   CBG: Recent Labs  Lab 06/10/19 1201 06/10/19 1811 06/11/19 0804 06/11/19 1121 06/11/19 1618  GLUCAP 146* 254* 140* 117* 129*   Lipid Profile: No results for input(s): CHOL, HDL, LDLCALC, TRIG, CHOLHDL, LDLDIRECT in the last 72 hours. Thyroid Function Tests: Recent Labs    06/09/19 1948  TSH 0.561   Anemia Panel: No results for input(s): VITAMINB12, FOLATE, FERRITIN, TIBC, IRON, RETICCTPCT in the last 72 hours. Urine analysis:    Component Value Date/Time   COLORURINE YELLOW 06/09/2019 1659   APPEARANCEUR CLEAR 06/09/2019 1659   LABSPEC >1.046 (H) 06/09/2019 1659   PHURINE 5.0 06/09/2019 1659   GLUCOSEU >=500 (A) 06/09/2019 1659   HGBUR NEGATIVE 06/09/2019 1659   BILIRUBINUR NEGATIVE 06/09/2019 1659   KETONESUR 20 (A) 06/09/2019 1659   PROTEINUR 30 (A) 06/09/2019 1659   UROBILINOGEN 1.0 04/05/2015 1155   NITRITE NEGATIVE 06/09/2019 1659    LEUKOCYTESUR NEGATIVE 06/09/2019 1659   Sepsis Labs: @LABRCNTIP (procalcitonin:4,lacticidven:4)  ) Recent Results (from the past 240 hour(s))  blood cx x2     Status: None (Preliminary result)   Collection Time: 06/09/19  9:35 AM   Specimen: BLOOD  Result Value Ref Range Status   Specimen Description   Final    BLOOD RIGHT WRIST Performed at [facility] [address], [place], [place] [address]    Special Requests   Final    BOTTLES DRAWN AEROBIC AND ANAEROBIC Blood Culture adequate volume   Culture   Final    NO GROWTH 2 DAYS Performed at [facility], [place] [address] [place], [place] [address]    Report Status PENDING  Incomplete  blood cx x2  Status: None (Preliminary result)   Collection Time: 06/09/19  9:35 AM   Specimen: BLOOD RIGHT HAND  Result Value Ref Range Status   Specimen Description   Final    BLOOD RIGHT HAND Performed at [facility], [address] [place], [place] [address]    Special Requests   Final    BOTTLES DRAWN AEROBIC AND ANAEROBIC Blood Culture adequate volume   Culture   Final    NO GROWTH 2 DAYS Performed at [facility], [address] [place], [place] [address]    Report Status PENDING  Incomplete  SARS CORONAVIRUS 2 (TAT 6-24 HRS) Nasopharyngeal Nasopharyngeal Swab     Status: None   Collection Time: 06/09/19  1:51 PM   Specimen: Nasopharyngeal Swab  Result Value Ref Range Status   SARS Coronavirus 2 NEGATIVE NEGATIVE Final    Comment: (NOTE) SARS-CoV-2 target nucleic acids are NOT DETECTED. The SARS-CoV-2 RNA is generally detectable in upper and lower respiratory specimens during the acute phase of infection. Negative results do not preclude SARS-CoV-2 infection, do not rule out co-infections with other pathogens, and should not be used as the sole basis for treatment or other patient management decisions. Negative results must be combined with clinical observations, patient  history, and epidemiological information. The expected result is Negative. Fact Sheet for Patients: HairSlick.nohttps://www.fda.gov/media/138098/download Fact Sheet for Healthcare Providers: quierodirigir.comhttps://www.fda.gov/media/138095/download This test is not yet approved or cleared by the [place] FDA and  has been authorized for detection and/or diagnosis of SARS-CoV-2 by FDA under an Emergency Use Authorization (EUA). This EUA will remain  in effect (meaning this test can be used) for the duration of the COVID-19 declaration under Section 56 4(b)(1) of the Act, 21 U.S.C. section 360bbb-3(b)(1), unless the authorization is terminated or revoked sooner. Performed at [facility], [address] [place], [place] [address]       Studies: No results found.  Scheduled Meds: . amLODipine  10 mg Oral Daily  . aspirin EC  81 mg Oral Daily  . atorvastatin  80 mg Oral q1800  . docusate sodium  100 mg Oral Daily  . ferrous sulfate  325 mg Oral QPM  . insulin aspart  0-15 Units Subcutaneous TID WC  . insulin aspart  0-5 Units Subcutaneous QHS  . insulin glargine  40 Units Subcutaneous QHS  . metoprolol tartrate  25 mg Oral BID  . pantoprazole  40 mg Oral BID AC  . ranolazine  500 mg Oral BID    Continuous Infusions: .  ceFAZolin (ANCEF) IV 2 g (06/11/19 1227)  . heparin 1,350 Units/hr (06/11/19 1733)     LOS: 2 days     Briant CedarNkeiruka J Naba Sneed, MD [facility]  If 7PM-7AM, please contact night-coverage [email or web address] 06/11/2019, 5:43 PM

## 2019-06-11 NOTE — Progress Notes (Signed)
Pharmacy: Re- heparin  Patient's a 68 y.o M on heparin for hx DVT/PE and new acute DVT.  Heparin level remains supra-therapeutic at 0.71 despite rate reduced to 1450 units/hr earlier today.  Plan: - reduce heparin drip at 1350 units/hr - check 6 hr heparin level - monitor for s/s bleeding  Dia Sitter, PharmD, BCPS 06/11/2019 4:29 PM

## 2019-06-11 NOTE — Care Management Important Message (Signed)
Important Message  Patient Details IM Letter given to Roque Lias SW Case Manager to present to the Patient Name: Samuel [name] MRN: [id number] Date of Birth: 10/07/1950   Medicare Important Message Given:  Yes     Kerin Salen 06/11/2019, 10:10 AM

## 2019-06-11 NOTE — Evaluation (Signed)
Physical Therapy Evaluation Patient Details Name: Samuel [name] MRN: [id number] DOB: [birth year] Today's Date: 06/11/2019   History of Present Illness  68 y.o. male with medical history significant for but not limited too with gastroparesis, L BKA, PE, s/p IVC filter CAD, depression and anxiety, GERD, history of MI, hypertension, OSA, as well as other comorbidities who presents with chief complaint of vomiting and nausea with abdominal pain which is consistent with his prior gastroparesis episodes. Reports some worsening right lower extremity pain and increased swelling as well as redness.  Pt admitted for cellulitis and acute R LE DVT  Clinical Impression  Pt admitted with above diagnosis.  Pt currently with functional limitations due to the deficits listed below (see PT Problem List). Pt will benefit from skilled PT to increase their independence and safety with mobility to allow discharge to the venue listed below.  Pt able to sit EOB and scoot up EOB to return to supine.  Pt declined transferring today.  Pt typically lateral scoot transfers to w/c for mobility. Pt reports he has prosthesis however has not been using it or walking lately. Pt reports he was planning to ask PCP to get him PT again to assist with mobilizing and using prosthesis again.  Pt feels confident he return home upon discharge.     Follow Up Recommendations Home health PT;Supervision - Intermittent    Equipment Recommendations  None recommended by PT    Recommendations for Other Services       Precautions / Restrictions Precautions Precautions: Fall      Mobility  Bed Mobility Overal bed mobility: Needs Assistance Bed Mobility: Supine to Sit;Sit to Supine     Supine to sit: Supervision;HOB elevated Sit to supine: Supervision   General bed mobility comments: pt able to lateral scoot up HOB to return to supine  Transfers                 General transfer comment: pt declined transferring feeling  poorly (nausea from gastroparesis per pt and weakness)  Ambulation/Gait                Stairs            Wheelchair Mobility    Modified Rankin (Stroke Patients Only)       Balance Overall balance assessment: Needs assistance Sitting-balance support: No upper extremity supported;Feet supported Sitting balance-Leahy Scale: Good                                       Pertinent Vitals/Pain Pain Assessment: No/denies pain    Home Living Family/patient expects to be discharged to:: Private residence Living Arrangements: Alone Available Help at Discharge: Family;Available PRN/intermittently Type of Home: Apartment Home Access: Level entry     Home Layout: One level Home Equipment: Walker - 2 wheels;Wheelchair - manual;Tub bench;Hand held shower head;Grab bars - tub/shower;Grab bars - toilet;Electric scooter Additional Comments: cousin 2 doors down and assists with caregiving needs    Prior Function Level of Independence: Needs assistance   Gait / Transfers Assistance Needed: pt reports he has not ambulated for months, has prosthesis and dons occasionally, typically lateral scoot to manual w/c  ADL's / Homemaking Assistance Needed: Cousin lives next door and acts as caregiver        Higher education careers adviser        Extremity/Trunk Assessment        Lower Extremity Assessment  Lower Extremity Assessment: RLE deficits/detail;LLE deficits/detail;Generalized weakness RLE Deficits / Details: R lower leg red and edematous however pt reports edema improved since admission LLE Deficits / Details: L BKA       Communication   Communication: No difficulties  Cognition Arousal/Alertness: Awake/alert Behavior During Therapy: WFL for tasks assessed/performed Overall Cognitive Status: Within Functional Limits for tasks assessed                                        General Comments      Exercises     Assessment/Plan    PT  Assessment Patient needs continued PT services  PT Problem List Decreased strength;Decreased activity tolerance;Decreased mobility       PT Treatment Interventions DME instruction;Therapeutic activities;Therapeutic exercise;Patient/family education;Functional mobility training;Balance training;Wheelchair mobility training    PT Goals (Current goals can be found in the Care Plan section)  Acute Rehab PT Goals PT Goal Formulation: With patient Time For Goal Achievement: 06/25/19 Potential to Achieve Goals: Good    Frequency Min 3X/week   Barriers to discharge        Co-evaluation               AM-PAC PT "6 Clicks" Mobility  Outcome Measure Help needed turning from your back to your side while in a flat bed without using bedrails?: A Little Help needed moving from lying on your back to sitting on the side of a flat bed without using bedrails?: A Little Help needed moving to and from a bed to a chair (including a wheelchair)?: A Lot Help needed standing up from a chair using your arms (e.g., wheelchair or bedside chair)?: A Lot Help needed to walk in hospital room?: Total Help needed climbing 3-5 steps with a railing? : Total 6 Click Score: 12    End of Session   Activity Tolerance: Patient tolerated treatment well Patient left: in bed;with call bell/phone within reach;with bed alarm set;with nursing/sitter in room   PT Visit Diagnosis: Other abnormalities of gait and mobility (R26.89)    Time: 5732-2025 PT Time Calculation (min) (ACUTE ONLY): 28 min   Charges:   PT Evaluation $PT Eval Low Complexity: 1 Low         Kati PT, DPT Acute Rehabilitation Services Office: [phone]   Trena Platt 06/11/2019, 3:55 PM

## 2019-06-11 NOTE — Progress Notes (Addendum)
ANTICOAGULATION CONSULT NOTE - Follow Up Consult  Pharmacy Consult for heparin Indication: hx DVT/PE, new acute DVT   Allergies  Allergen Reactions  . Gabapentin Hives, Itching, Nausea And Vomiting and Rash    REACTION: rash/hives (per patient, was a reaction to an inactive ingredient in another mgf brand). The patient stated that he does take this medication now and it doesn't cause a rash any more.  . Relafen [Nabumetone] Nausea And Vomiting and Rash  . Codeine Nausea And Vomiting and Rash    Patient Measurements: Height: 6\' 3"  (190.5 cm) Weight: 227 lb 8.2 oz (103.2 kg) IBW/kg (Calculated) : 84.5 Heparin Dosing Weight: n/a. TBW = 103 kg  Vital Signs: Temp: 98.6 F (37 C) (12/24 0534) Temp Source: Oral (12/24 0534) BP: 124/72 (12/24 0534) Pulse Rate: 89 (12/24 0534)  Labs: Recent Labs    06/09/19 0935 06/09/19 1351 06/10/19 0551 06/10/19 1340 06/11/19 0603  HGB 13.5  --  12.0*  --  11.6*  HCT 41.1  --  37.4*  --  36.7*  PLT 198  --  186  --  204  APTT  --  33  --   --   --   LABPROT  --  13.8  --   --   --   INR  --  1.1  --   --   --   HEPARINUNFRC  --  <0.10* 0.67 0.57 0.85*  CREATININE 1.27*  --  0.91  --  0.92    Estimated Creatinine Clearance: 102.8 mL/min (by C-G formula based on SCr of 0.92 mg/dL).   Medications:  On Eliquis 5 mg bid PTA (last dose taken on 06/07/19 at 2000)  Assessment: Patient's a 68 y.o M with hx chronic right foot wound and DVT/PE (s/p IVC filter) on Eliquis PTA presented to the ED on 12/22 with c/o leg pain. Pt reported being off Eliquis for several days PTA for an upcoming dental procedure.  LE doppler was positive for acute right LE DVT.  Heparin drip started on admission.  Baseline labs: -HL undetectable -aPTT 33 seconds -Hgb 13.5 -Plt 198 -INR 1.1  Today, 06/11/2019:  HL = 0.85 is supratherapeutic on heparin infusion of 1650 units/hr  Confirmed with RN that heparin infusing at correct rate. No signs/symptoms of  bleeding.   Hgb (11.6) - low but stable  Plt - WNL, stable  Goal of Therapy:  Heparin level 0.3-0.7 units/ml Monitor platelets by anticoagulation protocol: Yes   Plan:   Decrease heparin infusion to 1450 units/hr  Check 6 hour HL  CBC and HL daily while on heparin infusion  Monitor for signs/symptoms of bleeding  Follow for transition back to apixaban  Lenis Noon, PharmD 06/11/2019,8:47 AM  Addendum:  Pt confirmed with MD that he has not taking eliquis in several weeks. Were planning to transition to apixaban for VTE treatment today, but pt complaining of nausea. Per discussion with MD, will continue heparin drip for now and plan to transition to apixaban tomorrow.  Will order 6 hour HL.  Lenis Noon, PharmD 06/11/19 11:13 AM

## 2019-06-12 LAB — GLUCOSE, CAPILLARY
Glucose-Capillary: 142 mg/dL — ABNORMAL HIGH (ref 70–99)
Glucose-Capillary: 190 mg/dL — ABNORMAL HIGH (ref 70–99)
Glucose-Capillary: 245 mg/dL — ABNORMAL HIGH (ref 70–99)
Glucose-Capillary: 219 mg/dL — ABNORMAL HIGH (ref 70–99)

## 2019-06-12 LAB — BASIC METABOLIC PANEL
Anion gap: 9 (ref 5–15)
BUN: 17 mg/dL (ref 8–23)
CO2: 23 mmol/L (ref 22–32)
Calcium: 8.2 mg/dL — ABNORMAL LOW (ref 8.9–10.3)
Chloride: 102 mmol/L (ref 98–111)
Creatinine, Ser: 0.82 mg/dL (ref 0.61–1.24)
GFR calc Af Amer: >60 mL/min (ref >60)
GFR calc non Af Amer: >60 mL/min (ref >60)
Glucose, Bld: 158 mg/dL — ABNORMAL HIGH (ref 70–99)
Potassium: 3.6 mmol/L (ref 3.5–5.1)
Sodium: 134 mmol/L — ABNORMAL LOW (ref 135–145)

## 2019-06-12 LAB — CBC
HCT: 34.6 % — ABNORMAL LOW (ref 39.0–52.0)
Hemoglobin: 11 g/dL — ABNORMAL LOW (ref 13.0–17.0)
MCH: 30.6 pg (ref 26.0–34.0)
MCHC: 31.8 g/dL (ref 30.0–36.0)
MCV: 96.1 fL (ref 80.0–100.0)
Platelets: 184 10*3/uL (ref 150–400)
RBC: 3.6 MIL/uL — ABNORMAL LOW (ref 4.22–5.81)
RDW: 13.5 % (ref 11.5–15.5)
WBC: 4.9 10*3/uL (ref 4.0–10.5)
nRBC: 0 % (ref 0.0–0.2)

## 2019-06-12 LAB — HEPARIN LEVEL (UNFRACTIONATED): Heparin Unfractionated: 0.45 IU/mL (ref 0.30–0.70)

## 2019-06-12 MED ORDER — APIXABAN 5 MG PO TABS
10.0000 mg | ORAL_TABLET | Freq: Two times a day (BID) | ORAL | Status: DC
  Administered 2019-06-12 – 2019-06-13 (×3): 10 mg via ORAL
  Filled 2019-06-12 (×4): qty 2

## 2019-06-12 MED ORDER — APIXABAN 5 MG PO TABS: 5.0000 mg | ORAL_TABLET | Freq: Two times a day (BID) | ORAL | Status: DC

## 2019-06-12 NOTE — Discharge Instructions (Signed)
Information on my medicine - ELIQUIS (apixaban)  This medication education was reviewed with me or my healthcare representative as part of my discharge preparation.     Why was Eliquis prescribed for you? Eliquis was prescribed to treat blood clots that may have been found in the veins of your legs (deep vein thrombosis) or in your lungs (pulmonary embolism) and to reduce the risk of them occurring again.  What do You need to know about Eliquis ? The starting dose is 10 mg (two 5 mg tablets) taken TWICE daily for the FIRST SEVEN (7) DAYS, then on 06/19/19  the dose is reduced to ONE 5 mg tablet taken TWICE daily.  Eliquis may be taken with or without food.   Try to take the dose about the same time in the morning and in the evening. If you have difficulty swallowing the tablet whole please discuss with your pharmacist how to take the medication safely.  Take Eliquis exactly as prescribed and DO NOT stop taking Eliquis without talking to the doctor who prescribed the medication.  Stopping may increase your risk of developing a new blood clot.  Refill your prescription before you run out.  After discharge, you should have regular check-up appointments with your healthcare provider that is prescribing your Eliquis.    What do you do if you miss a dose? If a dose of ELIQUIS is not taken at the scheduled time, take it as soon as possible on the same day and twice-daily administration should be resumed. The dose should not be doubled to make up for a missed dose.  Important Safety Information A possible side effect of Eliquis is bleeding. You should call your healthcare provider right away if you experience any of the following: ? Bleeding from an injury or your nose that does not stop. ? Unusual colored urine (red or dark brown) or unusual colored stools (red or black). ? Unusual bruising for unknown reasons. ? A serious fall or if you hit your head (even if there is no bleeding).  Some  medicines may interact with Eliquis and might increase your risk of bleeding or clotting while on Eliquis. To help avoid this, consult your healthcare provider or pharmacist prior to using any new prescription or non-prescription medications, including herbals, vitamins, non-steroidal anti-inflammatory drugs (NSAIDs) and supplements.  This website has more information on Eliquis (apixaban): http://www.eliquis.com/eliquis/home

## 2019-06-12 NOTE — Progress Notes (Signed)
Patient continues to decline nocturnal CPAP. He is aware that he may request a machine and assistance if he should become compliant. Order changed to prn.

## 2019-06-12 NOTE — Progress Notes (Signed)
ANTICOAGULATION CONSULT NOTE - Follow Up Consult  Pharmacy Consult for heparin --> apixaban Indication: hx DVT/PE, new acute DVT   Allergies  Allergen Reactions  . Gabapentin Hives, Itching, Nausea And Vomiting and Rash    REACTION: rash/hives (per patient, was a reaction to an inactive ingredient in another mgf brand). The patient stated that he does take this medication now and it doesn't cause a rash any more.  . Relafen [Nabumetone] Nausea And Vomiting and Rash  . Codeine Nausea And Vomiting and Rash    Patient Measurements: Height: 6\' 3"  (190.5 cm) Weight: 227 lb 8.2 oz (103.2 kg) IBW/kg (Calculated) : 84.5 Heparin Dosing Weight: n/a. TBW = 103 kg  Vital Signs: Temp: 98.5 F (36.9 C) (12/25 0450) BP: 104/52 (12/25 0450) Pulse Rate: 74 (12/25 0450)  Labs: Recent Labs    06/09/19 1351 06/10/19 0551 06/11/19 0603 06/11/19 1503 06/11/19 2254 06/12/19 0633  HGB  --  12.0* 11.6*  --   --  11.0*  HCT  --  37.4* 36.7*  --   --  34.6*  PLT  --  186 204  --   --  184  APTT 33  --   --   --   --   --   LABPROT 13.8  --   --   --   --   --   INR 1.1  --   --   --   --   --   HEPARINUNFRC <0.10* 0.67 0.85* 0.71* 0.47 0.45  CREATININE  --  0.91 0.92  --   --  0.82    Estimated Creatinine Clearance: 115.3 mL/min (by C-G formula based on SCr of 0.82 mg/dL).   Medications:  Pt prescribed apixaban 5 mg PO BID PTA. Pt reports to MD that he stopped taking his apixaban ~2 weeks ago in anticipation for a dental procedure.   Assessment: Patient's a 68 y.o M with hx chronic right foot wound and DVT/PE (s/p IVC filter) on Eliquis PTA presented to the ED on 12/22 with c/o leg pain. Pt had not taking apixaban for ~2 weeks PTA. LE doppler + for acute right LE DVT on admission.   Baseline labs: -HL undetectable -aPTT 33 seconds -Hgb 13.5 -Plt 198 -INR 1.1  Today, 06/12/2019:  HL = 0.45 is therapeutic on heparin infusion of 1350 units/hr  Confirmed with RN that heparin infusing  at correct rate. No signs/symptoms of bleeding.   Hgb (11) - low but stable  Plt - WNL, stable  Pharmacy consulted to transition anticoagulation from heparin infusion to apixaban today.   Goal of Therapy:  Heparin level 0.3-0.7 units/ml Monitor platelets by anticoagulation protocol: Yes   Plan:   Discontinue heparin. Initiate apixaban at time that heparin d/c'd  Apixaban 10 mg PO BID x 7 days followed by 5 mg PO BID. Will restart VTE treatment dose given recent medication non-compliance and development of new DVT. Discussed with MD  Recheck CBC with AM labs tomorrow  Discussed medication dosing/education with patient  Lenis Noon, PharmD 06/12/19 8:51 AM

## 2019-06-12 NOTE — Progress Notes (Signed)
PROGRESS NOTE  Samuel [name] UDJ:[id number] DOB: [birth year] DOA: 06/09/2019 PCP: Jani Gravel, MD  HPI/Recap of past 24 hours: HPI from Dr Jarold Song is a 68 y.o. male with medical history significant for but not limited too with gastroparesis, left lower leg amputation, CAD, depression and anxiety, GERD, history of MI, hypertension, OSA, as well as other comorbidities who presents with chief complaint of vomiting and nausea with abdominal pain which is consistent with his prior gastroparesis episodes. Reports some worsening right lower extremity pain and increased swelling as well as redness. 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  Of note he has been on Eliquis for history of DVT and PE but stopped it for last 2 weeks himself due to him anticipating a dental procedure which is still not been scheduled or done yet. 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  Evaluation showed that he had an acute DVT and cellulitis.  TRH was called admit this patient for cellulitis and DVT.    Today, patient reports feeling much better, nausea has improved since resumption of his home IM Phenergan.  Denies any worsening abdominal pain, vomiting, chest pain, fever/chills, diarrhea.    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, noncompliant (held Eliquis due to possible unscheduled dental procedure) Lower extremity Doppler showed DVT in the right common femoral, popliteal, posterior tibial veins Switch to Eliquis, discontinue IV heparin Monitor closely  Possible right leg cellulitis/chronic wounds Noted to have multiple chronic wounds Currently afebrile, with resolved leukocytosis CTA lower extremity showed diffuse soft tissue edema, no evidence of soft tissue gas or focal abscess Continue IV  cefazolin  Diabetic gastroparesis flare Known history of gastroparesis Continue home IM Phenergan, PPI Advance to soft diet, advised small and frequent meals  Uncontrolled type 2 diabetes mellitus Last A1c was 12.1 SSI, Lantus, Accu-Cheks, hypoglycemic protocol  History of MI/CAD Currently chest pain-free Continue home ranolazine, atorvastatin, hold home Plavix, ASA  OSA Continue CPAP  Left BKA Stable       Malnutrition Type:      Malnutrition Characteristics:      Nutrition Interventions:       Estimated body mass index is 28.44 kg/m as calculated from the following:   Height as of this encounter: 6\' 3"  (1.905 m).   Weight as of this encounter: 103.2 kg.     Code Status: DNR  Family Communication: None at bedside  Disposition Plan: Likely home   Consultants:  None  Procedures:  None  Antimicrobials:  IV cefazolin  DVT prophylaxis: Eliquis   Objective: Vitals:   06/11/19 0534 06/11/19 1439 06/11/19 2030 06/12/19 0450  BP: 124/72 124/76 107/81 (!) 104/52  Pulse: 89 90 83 74  Resp: 17 19 20 17   Temp: 98.6 F (37 C) 99.1 F (37.3 C) 98.6 F (37 C) 98.5 F (36.9 C)  TempSrc: Oral Oral    SpO2: 95% 98% 97% 95%  Weight:      Height:        Intake/Output Summary (Last 24 hours) at 06/12/2019 1331 Last data filed at 06/11/2019 2327 Gross per 24 hour  Intake 619.66 ml  Output 500 ml  Net 119.66 ml   Filed Weights   06/09/19 2218 06/11/19 0500  Weight: 104.3 kg 103.2 kg  Exam:  General: NAD   Cardiovascular: S1, S2 present  Respiratory:  Diminished breath sounds bilaterally  Abdomen: Soft, nontender, nondistended, bowel sounds present  Musculoskeletal: 1+ RLE pedal edema noted, LLE BKA  Skin: RLL erythema, with few scabs noted on leg  Psychiatry: Normal mood   Data Reviewed: CBC: Recent Labs  Lab 06/09/19 0935 06/10/19 0551 06/11/19 0603 06/12/19 0633  WBC 12.0* 8.5 5.8 4.9  NEUTROABS  --  5.8  --   --    HGB 13.5 12.0* 11.6* 11.0*  HCT 41.1 37.4* 36.7* 34.6*  MCV 93.4 95.7 96.1 96.1  PLT 198 186 204 184   Basic Metabolic Panel: Recent Labs  Lab 06/09/19 0935 06/10/19 0551 06/11/19 0603 06/12/19 0633  NA 137 137 136 134*  K 4.1 3.6 3.5 3.6  CL 99 103 103 102  CO2 21* 23 24 23   GLUCOSE 212* 195* 139* 158*  BUN 34* 30* 24* 17  CREATININE 1.27* 0.91 0.92 0.82  CALCIUM 9.3 8.3* 8.5* 8.2*  MG  --  2.1  --   --   PHOS  --  2.6  --   --    GFR: Estimated Creatinine Clearance: 115.3 mL/min (by C-G formula based on SCr of 0.82 mg/dL). Liver Function Tests: Recent Labs  Lab 06/09/19 0935 06/10/19 0551  AST 28 21  ALT 17 15  ALKPHOS 79 63  BILITOT 1.2 0.7  PROT 7.9 6.5  ALBUMIN 3.5 2.8*   Recent Labs  Lab 06/09/19 0935  LIPASE 15   No results for input(s): AMMONIA in the last 168 hours. Coagulation Profile: Recent Labs  Lab 06/09/19 1351  INR 1.1   Cardiac Enzymes: No results for input(s): CKTOTAL, CKMB, CKMBINDEX, TROPONINI in the last 168 hours. BNP (last 3 results) No results for input(s): PROBNP in the last 8760 hours. HbA1C: Recent Labs    06/09/19 1948 06/10/19 0551  HGBA1C 12.2* 12.1*   CBG: Recent Labs  Lab 06/11/19 1121 06/11/19 1618 06/11/19 2031 06/12/19 0754 06/12/19 1152  GLUCAP 117* 129* 201* 142* 190*   Lipid Profile: No results for input(s): CHOL, HDL, LDLCALC, TRIG, CHOLHDL, LDLDIRECT in the last 72 hours. Thyroid Function Tests: Recent Labs    06/09/19 1948  TSH 0.561   Anemia Panel: No results for input(s): VITAMINB12, FOLATE, FERRITIN, TIBC, IRON, RETICCTPCT in the last 72 hours. Urine analysis:    Component Value Date/Time   COLORURINE YELLOW 06/09/2019 1659   APPEARANCEUR CLEAR 06/09/2019 1659   LABSPEC >1.046 (H) 06/09/2019 1659   PHURINE 5.0 06/09/2019 1659   GLUCOSEU >=500 (A) 06/09/2019 1659   HGBUR NEGATIVE 06/09/2019 1659   BILIRUBINUR NEGATIVE 06/09/2019 1659   KETONESUR 20 (A) 06/09/2019 1659   PROTEINUR  30 (A) 06/09/2019 1659   UROBILINOGEN 1.0 04/05/2015 1155   NITRITE NEGATIVE 06/09/2019 1659   LEUKOCYTESUR NEGATIVE 06/09/2019 1659   Sepsis Labs: @LABRCNTIP (procalcitonin:4,lacticidven:4)  ) Recent Results (from the past 240 hour(s))  blood cx x2     Status: None (Preliminary result)   Collection Time: 06/09/19  9:35 AM   Specimen: BLOOD  Result Value Ref Range Status   Specimen Description   Final    BLOOD RIGHT WRIST Performed at [facility], [address], [place], [place]    Special Requests   Final    BOTTLES DRAWN AEROBIC AND ANAEROBIC Blood Culture adequate volume   Culture   Final    NO GROWTH 3 DAYS Performed at [facility], [address] [place], [place] [address]    Report Status PENDING  Incomplete  blood cx x2     Status: None (Preliminary result)   Collection Time: 06/09/19  9:35 AM   Specimen: BLOOD RIGHT HAND  Result Value Ref Range Status   Specimen Description   Final    BLOOD RIGHT HAND Performed at [facility], [address] [place], [place] [address]    Special Requests   Final    BOTTLES DRAWN AEROBIC AND ANAEROBIC Blood Culture adequate volume   Culture   Final    NO GROWTH 3 DAYS Performed at [facility], [address] [place], [place] [address]    Report Status PENDING  Incomplete  SARS CORONAVIRUS 2 (TAT 6-24 HRS) Nasopharyngeal Nasopharyngeal Swab     Status: None   Collection Time: 06/09/19  1:51 PM   Specimen: Nasopharyngeal Swab  Result Value Ref Range Status   SARS Coronavirus 2 NEGATIVE NEGATIVE Final    Comment: (NOTE) SARS-CoV-2 target nucleic acids are NOT DETECTED. The SARS-CoV-2 RNA is generally detectable in upper and lower respiratory specimens during the acute phase of infection. Negative results do not preclude SARS-CoV-2 infection, do not rule out co-infections with other pathogens, and should not be used as the sole basis for treatment or other patient  management decisions. Negative results must be combined with clinical observations, patient history, and epidemiological information. The expected result is Negative. Fact Sheet for Patients: HairSlick.nohttps://www.fda.gov/media/138098/download Fact Sheet for Healthcare Providers: quierodirigir.comhttps://www.fda.gov/media/138095/download This test is not yet approved or cleared by the [place] FDA and  has been authorized for detection and/or diagnosis of SARS-CoV-2 by FDA under an Emergency Use Authorization (EUA). This EUA will remain  in effect (meaning this test can be used) for the duration of the COVID-19 declaration under Section 56 4(b)(1) of the Act, 21 U.S.C. section 360bbb-3(b)(1), unless the authorization is terminated or revoked sooner. Performed at [facility], [address] [place], [place] [address]       Studies: No results found.  Scheduled Meds: . amLODipine  10 mg Oral Daily  . apixaban  10 mg Oral BID   Followed by  . [START ON 06/19/2019] apixaban  5 mg Oral BID  . aspirin EC  81 mg Oral Daily  . atorvastatin  80 mg Oral q1800  . docusate sodium  100 mg Oral Daily  . ferrous sulfate  325 mg Oral QPM  . insulin aspart  0-15 Units Subcutaneous TID WC  . insulin aspart  0-5 Units Subcutaneous QHS  . insulin glargine  40 Units Subcutaneous QHS  . metoprolol tartrate  25 mg Oral BID  . pantoprazole  40 mg Oral BID AC  . ranolazine  500 mg Oral BID    Continuous Infusions: .  ceFAZolin (ANCEF) IV 2 g (06/12/19 1252)     LOS: 3 days     Briant CedarNkeiruka J Jenesis Martin, MD [facility]  If 7PM-7AM, please contact night-coverage [email or web address] 06/12/2019, 1:31 PM

## 2019-06-12 NOTE — Progress Notes (Signed)
Pharmacy: Re- heparin  Patient's a 68 y.o M on heparin for hx DVT/PE and new acute DVT.  Heparin level = 0.47 at goal on 1350 units/hr. No problems reported by RN.   Plan: - continue heparin drip at 1350 units/hr - daily cbc/HL - monitor for s/s bleeding  Dorrene German 06/12/2019 12:07 AM

## 2019-06-13 LAB — CBC WITH DIFFERENTIAL/PLATELET
Abs Immature Granulocytes: 0.02 10*3/uL (ref 0.00–0.07)
Basophils Absolute: 0 10*3/uL (ref 0.0–0.1)
Basophils Relative: 1 %
Eosinophils Absolute: 0.2 10*3/uL (ref 0.0–0.5)
Eosinophils Relative: 4 %
HCT: 36.4 % — ABNORMAL LOW (ref 39.0–52.0)
Hemoglobin: 11.4 g/dL — ABNORMAL LOW (ref 13.0–17.0)
Immature Granulocytes: 0 %
Lymphocytes Relative: 13 %
Lymphs Abs: 0.7 10*3/uL (ref 0.7–4.0)
MCH: 30.2 pg (ref 26.0–34.0)
MCHC: 31.3 g/dL (ref 30.0–36.0)
MCV: 96.6 fL (ref 80.0–100.0)
Monocytes Absolute: 0.5 10*3/uL (ref 0.1–1.0)
Monocytes Relative: 10 %
Neutro Abs: 3.7 10*3/uL (ref 1.7–7.7)
Neutrophils Relative %: 72 %
Platelets: 237 10*3/uL (ref 150–400)
RBC: 3.77 MIL/uL — ABNORMAL LOW (ref 4.22–5.81)
RDW: 13.8 % (ref 11.5–15.5)
WBC: 5.1 10*3/uL (ref 4.0–10.5)
nRBC: 0 % (ref 0.0–0.2)

## 2019-06-13 LAB — BASIC METABOLIC PANEL
Anion gap: 12 (ref 5–15)
BUN: 15 mg/dL (ref 8–23)
CO2: 24 mmol/L (ref 22–32)
Calcium: 8.5 mg/dL — ABNORMAL LOW (ref 8.9–10.3)
Chloride: 101 mmol/L (ref 98–111)
Creatinine, Ser: 0.99 mg/dL (ref 0.61–1.24)
GFR calc Af Amer: >60 mL/min (ref >60)
GFR calc non Af Amer: >60 mL/min (ref >60)
Glucose, Bld: 156 mg/dL — ABNORMAL HIGH (ref 70–99)
Potassium: 3.6 mmol/L (ref 3.5–5.1)
Sodium: 137 mmol/L (ref 135–145)

## 2019-06-13 LAB — GLUCOSE, CAPILLARY
Glucose-Capillary: 141 mg/dL — ABNORMAL HIGH (ref 70–99)
Glucose-Capillary: 189 mg/dL — ABNORMAL HIGH (ref 70–99)

## 2019-06-13 MED ORDER — CEPHALEXIN 500 MG PO CAPS: 500.0000 mg | ORAL_CAPSULE | Freq: Four times a day (QID) | ORAL | 0 refills | Status: AC

## 2019-06-13 MED ORDER — APIXABAN (ELIQUIS) VTE STARTER PACK (10MG AND 5MG): ORAL_TABLET | ORAL | 0 refills | Status: DC

## 2019-06-13 NOTE — Evaluation (Signed)
Occupational Therapy Evaluation Patient Details Name: Samuel [name] MRN: [id number] DOB: 1950-08-27 Today's Date: 06/13/2019    History of Present Illness 68 y.o. male with medical history significant for but not limited too with gastroparesis, L BKA, PE, s/p IVC filter CAD, depression and anxiety, GERD, history of MI, hypertension, OSA, as well as other comorbidities who presents with chief complaint of vomiting and nausea with abdominal pain which is consistent with his prior gastroparesis episodes. Reports some worsening right lower extremity pain and increased swelling as well as redness.  Pt admitted for cellulitis and acute R LE DVT   Clinical Impression   OT eval and education complete    Follow Up Recommendations  No OT follow up;Supervision - Intermittent    Equipment Recommendations  None recommended by OT    Recommendations for Other Services       Precautions / Restrictions Precautions Precautions: Fall      Mobility Bed Mobility Overal bed mobility: Modified Independent       Supine to sit: Modified independent (Device/Increase time)        Transfers          bed to WC- mod I            Balance Overall balance assessment: Needs assistance Sitting-balance support: No upper extremity supported;Feet supported Sitting balance-Leahy Scale: Good                                     ADL either performed or assessed with clinical judgement   ADL Overall ADL's : Independent                                       General ADL Comments: Pt is I with simple ADL actvity .  Pt donned clothes with OT with set up.  Pt verbalized and demontrate good safety awarensss of fall prevention with ADL activity . Cousin will A as needed     Vision Baseline Vision/History: No visual deficits              Pertinent Vitals/Pain Pain Assessment: No/denies pain     Hand Dominance     Extremity/Trunk Assessment Upper Extremity  Assessment Upper Extremity Assessment: Generalized weakness   Lower Extremity Assessment RLE Deficits / Details: R lower leg red and edematous however pt reports edema improved since admission LLE Deficits / Details: L BKA       Communication Communication Communication: No difficulties   Cognition Arousal/Alertness: Awake/alert Behavior During Therapy: WFL for tasks assessed/performed Overall Cognitive Status: Within Functional Limits for tasks assessed                                                Home Living Family/patient expects to be discharged to:: Private residence Living Arrangements: Alone Available Help at Discharge: Family;Available PRN/intermittently Type of Home: Apartment Home Access: Level entry     Home Layout: One level               Home Equipment: Walker - 2 wheels;Wheelchair - manual;Tub bench;Hand held shower head;Grab bars - tub/shower;Grab bars - toilet;Electric scooter   Additional Comments: cousin 2 doors down and assists with caregiving needs  Prior Functioning/Environment Level of Independence: Needs assistance  Gait / Transfers Assistance Needed: pt reports he has not ambulated for months, has prosthesis and dons occasionally, typically lateral scoot to manual w/c ADL's / Homemaking Assistance Needed: Cousin lives next door and acts as caregiver                     OT Goals(Current goals can be found in the care plan section) Acute Rehab OT Goals Patient Stated Goal: home today OT Goal Formulation: With patient  OT Frequency:      AM-PAC OT "6 Clicks" Daily Activity     Outcome Measure Help from another person eating meals?: None Help from another person taking care of personal grooming?: None Help from another person toileting, which includes using toliet, bedpan, or urinal?: None Help from another person bathing (including washing, rinsing, drying)?: None Help from another person to put on and taking  off regular upper body clothing?: None Help from another person to put on and taking off regular lower body clothing?: None 6 Click Score: 24   End of Session    Activity Tolerance: Patient tolerated treatment well Patient left: in bed;with call bell/phone within reach                   Time: 0154-0215 OT Time Calculation (min): 21 min Charges:  OT General Charges $OT Visit: 1 Visit OT Evaluation $OT Eval Low Complexity: 1 Low  Kari Baars, OT Acute Rehabilitation Services [phone] Office- [phone]     Jenaye Rickert, Edwena Felty D 06/13/2019, 4:31 PM

## 2019-06-13 NOTE — Progress Notes (Signed)
Pt leaving this afternoon via Taxi, headed home, where his cousin will meet him. Discharge instructions given/explained with pt verbalizing understanding.

## 2019-06-13 NOTE — TOC Initial Note (Signed)
Transition of Care [facility]) - Initial/Assessment Note    Patient Details  Name: Samuel [name] MRN: [id number] Date of Birth: Aug 16, 1950  [facility] CM/SW Contact:    Leeroy Cha, RN Phone Number: 06/13/2019, 12:14 PM  Clinical Narrative:                 [facility] for [facility]  Expected Discharge Plan: [place]     Patient Goals and CMS Choice Patient states their goals for this hospitalization and ongoing recovery are:: to go home CMS Medicare.gov Compare Post Acute Care list provided to:: Patient Choice offered to / list presented to : Patient  Expected Discharge Plan and Services Expected Discharge Plan: [place]   Discharge Planning Services: CM Consult Post Acute Care Choice: [place] arrangements for the past 2 months: Single Family Home Expected Discharge Date: 06/13/19                         HH Arranged: PT, OT HH Agency: [name] [employer]) Date [place]: 06/13/19 Time [place]: 1212 Representative spoke with at [place]: [place] Arrangements/Services Living arrangements for the past 2 months: [place] Lives with:: Spouse Patient language and need for interpreter reviewed:: No Do you feel safe going back to the place where you live?: Yes      Need for Family Participation in Patient Care: Yes (Comment) Care giver support system in place?: Yes (comment)   Criminal Activity/Legal Involvement Pertinent to Current Situation/Hospitalization: No - Comment as needed  Activities of Daily Living Home Assistive Devices/Equipment: CBG Meter, Electric scooter, Prosthesis, Grab bars in shower, Shower chair with back(left prosthesis) ADL Screening (condition at time of admission) Patient's cognitive ability adequate to safely complete daily activities?: Yes Is the patient deaf or have difficulty hearing?: No Does the patient have difficulty  seeing, even when wearing glasses/contacts?: No Does the patient have difficulty concentrating, remembering, or making decisions?: No Patient able to express need for assistance with ADLs?: Yes Does the patient have difficulty dressing or bathing?: Yes Independently performs ADLs?: No Communication: Independent Dressing (OT): Needs assistance Is this a change from baseline?: Pre-admission baseline Grooming: Needs assistance Is this a change from baseline?: Pre-admission baseline Feeding: Independent Bathing: Needs assistance(niece helps patient alot) Is this a change from baseline?: Pre-admission baseline Toileting: Needs assistance Is this a change from baseline?: Pre-admission baseline In/Out Bed: Needs assistance Is this a change from baseline?: Pre-admission baseline Walks in Home: Needs assistance Is this a change from baseline?: Pre-admission baseline Does the patient have difficulty walking or climbing stairs?: Yes(left bka and weakness) Weakness of Legs: Right Weakness of Arms/Hands: None  Permission Sought/Granted   Permission granted to share information with : Yes, Verbal Permission Granted     Permission granted to share info w AGENCY: [facility]        Emotional Assessment Appearance:: Appears stated age     Orientation: : Oriented to Self, Oriented to Place, Oriented to  Time, Oriented to Situation Alcohol / Substance Use: Not Applicable Psych Involvement: No (comment)  Admission diagnosis:  DVT (deep venous thrombosis) (HCC) [I82.409] DVT (deep vein thrombosis) in pregnancy [O22.30] Patient Active Problem List   Diagnosis Date Noted  . DVT (deep venous thrombosis) ([place]) 06/09/2019  . DKA (diabetic ketoacidoses) ([place]) 10/01/2018  . Gastroparesis 08/17/2018  . Chest pain 08/16/2018  . DKA, type  2 (HCC) 02/03/2018  . DOE (dyspnea on exertion) 01/24/2018  . Uncontrolled diabetes mellitus (HCC) 09/23/2017  . DKA, type 2, not at goal [facility]) 09/22/2017  . S/P BKA  (below knee amputation) (HCC) 04/19/2017  . COPD (chronic obstructive pulmonary disease) (HCC) 04/19/2017  . NSTEMI (non-ST elevated myocardial infarction) (HCC) 04/07/2017  . Osteomyelitis of foot (HCC)   . Protein-calorie malnutrition, severe (HCC) 03/08/2015  . Osteomyelitis of foot, acute (HCC) 03/07/2015  . Diabetes mellitus type 2, uncontrolled (HCC) 03/07/2015  . Normocytic normochromic anemia 03/07/2015  . Obesity (BMI 30-39.9) 03/07/2015  . Nausea & vomiting   . DM (diabetes mellitus), type 2 (HCC) 01/07/2014  . Chronic osteomyelitis involving lower leg (HCC) 01/07/2014  . Vomiting 12/22/2013  . Diabetic gastroparesis (HCC) 12/21/2013  . Intractable nausea and vomiting 12/21/2013  . Essential hypertension 05/01/2013  . Hyperlipidemia due to type 2 diabetes mellitus (HCC) 05/01/2013  . Generalized weakness 03/20/2013  . Back pain 03/20/2013  . Pulmonary embolism (HCC) 03/27/2011  . PERFORATION OF GALLBLADDER 08/30/2010  . Ulcer of lower limb, unspecified 03/01/2010  . METHICILLIN SUSCEPTIBLE STAPH AUREUS SEPTICEMIA 01/25/2010  . Acute osteomyelitis, ankle and foot 01/25/2010  . NAUSEA AND VOMITING 01/25/2010  . GERD 11/09/2009  . CAD S/P DES PCI to mLAD: Xience DES 2.75 x 15 (3.0 mm) 05/01/2009  . DIABETES, TYPE 1 05/14/2008  . OBSTRUCTIVE SLEEP APNEA 04/30/2008  . ALLERGIC RHINITIS, SEASONAL 04/30/2008   PCP:  [name], MD Pharmacy:   [facility] [place], [place] - [address] [place] [address] Phone: [phone] Fax: [phone]     Social Determinants of Health (SDOH) Interventions    Readmission Risk Interventions Readmission Risk Prevention Plan 10/04/2018  Transportation Screening Complete  HRI or Home Care Consult Complete  SW Recovery Care/Counseling Consult Not Complete  Palliative Care Screening Not Applicable  Skilled Nursing Facility Not Applicable  Some recent data might be hidden

## 2019-06-13 NOTE — Discharge Summary (Signed)
Discharge Summary  Samuel [name]:[id number] DOB: 1950-06-24  PCP: [name], MD  Admit date: 06/09/2019 Discharge date: 06/13/2019  Time spent: 40 mins   Recommendations for Outpatient Follow-up:  1. PCP in 1 week  Discharge Diagnoses:  Active Hospital Problems   Diagnosis Date Noted  . DVT (deep venous thrombosis) (HCC) 06/09/2019    Resolved Hospital Problems  No resolved problems to display.    Discharge Condition: Stable  Diet recommendation: Cardiac/moderate carb  Vitals:   06/12/19 2030 06/13/19 0441  BP: 128/71 (!) 114/51  Pulse: 78 78  Resp: 16 16  Temp: 98.6 F (37 C) 98.8 F (37.1 C)  SpO2: 93% 94%    History of present illness:  Samuel Maser Hinsonis a 68 y.o.malewith medical history significantfor but not limited to gastroparesis, left lower leg amputation, CAD, depression and anxiety, GERD, history of MI, hypertension, OSA, as well as other comorbidities who presents with chief complaint of vomiting and nausea with abdominal pain which is consistent with his prior gastroparesis episodes. Reports some worsening right lower extremity pain and increased swelling as well as redness. 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 Of note he has been on Eliquis for history of DVT and PE but stopped it for last 2 weeks himself due to him anticipating a dental procedure which is still not been scheduled or done yet. 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 Evaluation showed that he had an acute DVT and cellulitis. [facility] was called admit this patient for cellulitis and DVT.    Today, patient reports feeling better, denies any worsening nausea, denies any abdominal pain, vomiting, chest pain, fever/chills, diarrhea.  Patient eager to be discharged.  Patient stable for discharge with close follow-up with PCP within 1 week.  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, noncompliant (held Eliquis due to possible unscheduled dental procedure) Lower extremity Doppler showed DVT in the right common femoral, popliteal, posterior tibial veins Switch to Eliquis (start new treatment for DVT), s/p IV heparin No signs of bleeding noted Patient assured me he will be compliant to his Eliquis Follow-up with PCP within 1 week  Possible right leg cellulitis/chronic wounds Noted to have multiple scabbed wounds Currently afebrile, with resolved leukocytosis CTA lower extremity showed diffuse soft tissue edema, no evidence of soft tissue gas or focal abscess S/p IV cefazolin, switched to p.o. Keflex to complete 7 days  Diabetic gastroparesis flare Known history of gastroparesis Continue home IM Phenergan, PPI Advised small and frequent meals  Uncontrolled type 2 diabetes mellitus Last A1c was 12.1 Continue home insulin regimen  History of MI/CAD Currently chest pain-free Continue home ranolazine, atorvastatin, Plavix, ASA  OSA Continue CPAP  Left BKA Stable         Malnutrition Type:      Malnutrition Characteristics:      Nutrition Interventions:      Estimated body mass index is 33.73 kg/m as calculated from the following:   Height as of this encounter:  (1.905 m).   Weight as of this encounter: 122.4 kg.    Procedures:  None  Consultations:  None  Discharge Exam: BP (!) 114/51 (BP Location: Left Arm)   Pulse 78   Temp 98.8 F (37.1 C)   Resp 16  Ht 6\' 3"  (1.905 m)   Wt 122.4 kg   SpO2 94%   BMI 33.73 kg/m   General: NAD Cardiovascular: S1, S2 present Respiratory: CTAB  Discharge Instructions You were cared for by a hospitalist during your hospital stay. If you have any questions about your discharge medications or the care you received while you were in the hospital after you are discharged, you can call the unit  and asked to speak with the hospitalist on call if the hospitalist that took care of you is not available. Once you are discharged, your primary care physician will handle any further medical issues. Please note that NO REFILLS for any discharge medications will be authorized once you are discharged, as it is imperative that you return to your primary care physician (or establish a relationship with a primary care physician if you do not have one) for your aftercare needs so that they can reassess your need for medications and monitor your lab values.  Discharge Instructions    Diet - low sodium heart healthy   Complete by: As directed    Increase activity slowly   Complete by: As directed      Allergies as of 06/13/2019      Reactions   Gabapentin Hives, Itching, Nausea And Vomiting, Rash   REACTION: rash/hives (per patient, was a reaction to an inactive ingredient in another mgf brand). The patient stated that he does take this medication now and it doesn't cause a rash any more.   Relafen [nabumetone] Nausea And Vomiting, Rash   Codeine Nausea And Vomiting, Rash      Medication List    STOP taking these medications   apixaban 5 MG Tabs tablet Commonly known as: ELIQUIS Replaced by: Apixaban Starter Pack 5 MG Tbpk     TAKE these medications   amLODipine 10 MG tablet Commonly known as: NORVASC Take 1 tablet (10 mg total) by mouth daily.   Apixaban Starter Pack 5 MG Tbpk Commonly known as: ELIQUIS STARTER PACK Take as directed on package: start with two-5mg  tablets twice daily for 7 days. On day 8, switch to one-5mg  tablet twice daily. Replaces: apixaban 5 MG Tabs tablet   aspirin 81 MG EC tablet Take 1 tablet (81 mg total) by mouth daily.   atorvastatin 80 MG tablet Commonly known as: LIPITOR Take 1 tablet (80 mg total) by mouth daily at 6 PM.   Basaglar KwikPen 100 UNIT/ML Sopn Inject 40 Units into the skin daily.   cephALEXin 500 MG capsule Commonly known as:  KEFLEX Take 1 capsule (500 mg total) by mouth 4 (four) times daily for 3 days.   clopidogrel 75 MG tablet Commonly known as: PLAVIX Take 75 mg by mouth daily.   docusate sodium 100 MG capsule Commonly known as: COLACE Take 100 mg by mouth daily.   ferrous sulfate 325 (65 FE) MG tablet Take 325 mg by mouth every evening.   furosemide 20 MG tablet Commonly known as: LASIX Take 1 tablet (20 mg total) by mouth daily. What changed:   when to take this  reasons to take this   gabapentin 300 MG capsule Commonly known as: NEURONTIN Take 600 mg by mouth 2 (two) times daily.   insulin lispro 100 UNIT/ML injection Commonly known as: HUMALOG Inject 5-18 Units into the skin See admin instructions. units three times daily before meals Sliding Scale 150-250 = 5 units 251-350= 6-8 units 351-450= 10 units Over 450 18 units   metFORMIN 500 MG 24  hr tablet Commonly known as: GLUCOPHAGE-XR Take 500 mg by mouth daily.   metoprolol tartrate 25 MG tablet Commonly known as: LOPRESSOR Take 1 tablet (25 mg total) by mouth 2 (two) times daily.   nitroGLYCERIN 0.4 MG SL tablet Commonly known as: NITROSTAT Place 1 tablet (0.4 mg total) under the tongue every 5 (five) minutes as needed for chest pain.   OneTouch Verio test strip Generic drug: glucose blood Use to test blood sugar twice daily   pantoprazole 40 MG tablet Commonly known as: PROTONIX Take 1 tablet (40 mg total) by mouth 2 (two) times daily before a meal.   promethazine 25 MG suppository Commonly known as: PHENERGAN Place 1 suppository (25 mg total) rectally every 6 (six) hours as needed for nausea or vomiting.   promethazine 25 MG tablet Commonly known as: PHENERGAN Take 25-50 mg by mouth every 6 (six) hours as needed for nausea or vomiting.   promethazine 25 MG/ML injection Commonly known as: PHENERGAN Inject 25 mg into the muscle every 6 (six) hours as needed for nausea/vomiting.   ranolazine 500 MG 12 hr  tablet Commonly known as: RANEXA Take 1 tablet (500 mg total) by mouth 2 (two) times daily.   traMADol 50 MG tablet Commonly known as: ULTRAM Take 100 mg by mouth every 6 (six) hours as needed for moderate pain.      Allergies  Allergen Reactions  . Gabapentin Hives, Itching, Nausea And Vomiting and Rash    REACTION: rash/hives (per patient, was a reaction to an inactive ingredient in another mgf brand). The patient stated that he does take this medication now and it doesn't cause a rash any more.  . Relafen [Nabumetone] Nausea And Vomiting and Rash  . Codeine Nausea And Vomiting and Rash   Follow-up Information    [name], MD. Schedule an appointment as soon as possible for a visit in 1 week(s).   Specialty: Internal Medicine Contact information: [address] [place] [address] [place] [address] [phone]            The results of significant diagnostics from this hospitalization (including imaging, microbiology, ancillary and laboratory) are listed below for reference.    Significant Diagnostic Studies: CT ANGIO AO+BIFEM W & OR WO CONTRAST  Result Date: 06/09/2019 CLINICAL DATA:  Extensive right lower extremity DVT from the level of the common femoral vein to the calf veins. Discoloration and foul-smelling right lower extremity. Prior below-knee amputation of the left lower extremity. EXAM: CT ANGIOGRAPHY OF ABDOMINAL AORTA WITH ILIOFEMORAL RUNOFF TECHNIQUE: Multidetector CT imaging of the abdomen, pelvis and lower extremities was performed using the standard protocol during bolus administration of intravenous contrast. Multiplanar CT image reconstructions and MIPs were obtained to evaluate the vascular anatomy. CONTRAST:  OMNIPAQUE IOHEXOL 350 MG/ML SOLN COMPARISON:  CT of the abdomen and pelvis on 08/17/2018 FINDINGS: VASCULAR Aorta: The abdominal aorta is normally patent and demonstrates mild scattered calcified plaque. No evidence of aneurysmal disease or  dissection. Celiac: Mild plaque at the origin of the celiac axis without significant stenosis. Normal branch vessel anatomy and patency. SMA: Mild calcified plaque at the origin of the SMA without significant stenosis. Normally patent visualized branch vessels. Renals: Bilateral single renal arteries demonstrate no significant stenosis. IMA: Normally patent. Venous: Indwelling IVC filter within the lower aspect of the IVC. Venous opacification is not adequate to evaluate the IVC or iliac veins for the presence of thrombus. No asymmetry in caliber of the iliac veins to suggest obvious distension from  thrombus. No evidence of extrinsic compression of venous structures in the abdomen or pelvis. RIGHT Lower Extremity Inflow: Normally patent right-sided iliac arteries. Outflow: Common femoral artery and femoral bifurcation are widely patent. Profunda femoral artery and superficial femoral artery demonstrate mild scattered plaque without evidence of significant stenosis. Runoff: Normally patent popliteal artery. Three-vessel runoff below the knee with dominant anterior tibial runoff which appears patent into the foot. The posterior tibial artery is diffusely small in caliber. The peroneal artery shows scattered plaque but appears to be patent at least to the level of the ankle. LEFT Lower Extremity Inflow: Normally patent left-sided iliac arteries. Outflow: Common femoral artery and femoral bifurcation are widely patent. Profunda femoral artery and superficial femoral artery demonstrate mild scattered plaque without evidence of significant stenosis. Runoff: Above knee popliteal artery is normally patent. Below-knee amputation present. Review of the MIP images confirms the above findings. NON-VASCULAR Lower chest: No acute abnormality. Hepatobiliary: Probable hepatic steatosis. No focal lesion identified. Status post cholecystectomy. No biliary dilatation. Pancreas: Diffuse atrophy and fatty replacement of the pancreatic  parenchyma. Spleen: Normal in size without focal abnormality. Adrenals/Urinary Tract: No adrenal masses. Stable left upper pole renal cyst. No hydronephrosis. The bladder is unremarkable. Stomach/Bowel: Bowel shows no evidence of obstruction, ileus, inflammation or visible lesion. No free air identified. Lymphatic: No enlarged lymph nodes identified. Reproductive: Prostate is unremarkable. Other: Diffuse soft tissue edema of the right lower extremity causing significant overall enlargement of the right leg compared to the left. No evidence soft tissue gas or focal abscess in the right lower extremity. No bony destruction or lesion. Musculoskeletal: No acute or significant osseous findings. IMPRESSION: 1. No evidence of significant arterial occlusive disease in the abdomen, pelvis or bilateral lower extremities. 2. Diffuse soft tissue edema of the right lower extremity causing significant overall enlargement of the right leg compared to the left. No evidence of soft tissue gas or focal abscess in the right lower extremity. 3. Indwelling IVC filter within the lower aspect of the IVC. Venous opacification is not adequate to evaluate the IVC or iliac veins for the presence of thrombus. No evidence of extrinsic compression of venous structures in the abdomen or pelvis. Electronically Signed   By: Irish Lack M.D.   On: 06/09/2019 13:40   DG Foot 2 Views Right  Result Date: 06/09/2019 CLINICAL DATA:  Discolored foot.  Right foot pain. EXAM: RIGHT FOOT - 2 VIEW COMPARISON:  08/22/2014 FINDINGS: Remote amputation involving the distal phalanx of the great toe. No acute bony findings or destructive bony changes. Mild to moderate midfoot degenerative changes are noted. No abnormal soft tissue calcifications or gas in the soft tissues. IMPRESSION: 1. Remote amputation involving the great toe. 2. No acute bony findings or destructive bony changes. Electronically Signed   By: Rudie Meyer M.D.   On: 06/09/2019 10:23    VAS [place] LOWER EXTREMITY VENOUS (DVT) (ONLY [facility] & [facility] 7a-7p)  Result Date: 06/09/2019  Lower Venous Study Indications: Swelling.  Risk Factors: None identified. Limitations: Body habitus, poor ultrasound/tissue interface, open wound and patient positioning, patient movement, inability supinate. Comparison Study: No prior studies. Performing Technologist: Chanda Busing RVT  Examination Guidelines: A complete evaluation includes B-mode imaging, spectral Doppler, color Doppler, and power Doppler as needed of all accessible portions of each vessel. Bilateral testing is considered an integral part of a complete examination. Limited examinations for reoccurring indications may be performed as noted.  +---------+---------------+---------+-----------+----------+--------------+ RIGHT    CompressibilityPhasicitySpontaneityPropertiesThrombus Aging +---------+---------------+---------+-----------+----------+--------------+ CFV  None           No       No                   Acute          +---------+---------------+---------+-----------+----------+--------------+ SFJ      None                                         Acute          +---------+---------------+---------+-----------+----------+--------------+ FV Prox  None           No       No                   Acute          +---------+---------------+---------+-----------+----------+--------------+ FV Mid   None           No       No                   Acute          +---------+---------------+---------+-----------+----------+--------------+ FV DistalNone           No       No                   Acute          +---------+---------------+---------+-----------+----------+--------------+ PFV                                                   Not visualized +---------+---------------+---------+-----------+----------+--------------+ POP      None           No       No                   Acute           +---------+---------------+---------+-----------+----------+--------------+ PTV      None                                         Acute          +---------+---------------+---------+-----------+----------+--------------+ PERO                                                  Not visualized +---------+---------------+---------+-----------+----------+--------------+ Unable to assess the CIV or EIV due to the patient's positioning, and inability to supinate.  +----+---------------+---------+-----------+----------+--------------+ LEFTCompressibilityPhasicitySpontaneityPropertiesThrombus Aging +----+---------------+---------+-----------+----------+--------------+ CFV                                              Not visualized +----+---------------+---------+-----------+----------+--------------+ Unable to assess the CFV due to the patient's positioning, and inability to supinate.    Summary: Right: Findings consistent with acute deep vein thrombosis involving the right common femoral vein, right femoral vein, right popliteal vein, and right posterior tibial veins. No cystic structure found in the popliteal fossa.  *  See table(s) above for measurements and observations. Electronically signed by Deitra Mayo MD on 06/09/2019 at 5:24:04 PM.    Final     Microbiology: Recent Results (from the past 240 hour(s))  blood cx x2     Status: None (Preliminary result)   Collection Time: 06/09/19  9:35 AM   Specimen: BLOOD  Result Value Ref Range Status   Specimen Description   Final    BLOOD RIGHT WRIST Performed at [facility] [address], [place], [place] [address]    Special Requests   Final    BOTTLES DRAWN AEROBIC AND ANAEROBIC Blood Culture adequate volume   Culture   Final    NO GROWTH 4 DAYS Performed at [facility], [place] [address], [place], [place] [address]    Report Status PENDING  Incomplete  blood cx x2     Status: None (Preliminary  result)   Collection Time: 06/09/19  9:35 AM   Specimen: BLOOD RIGHT HAND  Result Value Ref Range Status   Specimen Description   Final    BLOOD RIGHT HAND Performed at [name] [address], [place], [place] [address]    Special Requests   Final    BOTTLES DRAWN AEROBIC AND ANAEROBIC Blood Culture adequate volume   Culture   Final    NO GROWTH 4 DAYS Performed at [facility], [place] [address], [place], [place] [address]    Report Status PENDING  Incomplete  SARS CORONAVIRUS 2 (TAT 6-24 HRS) Nasopharyngeal Nasopharyngeal Swab     Status: None   Collection Time: 06/09/19  1:51 PM   Specimen: Nasopharyngeal Swab  Result Value Ref Range Status   SARS Coronavirus 2 NEGATIVE NEGATIVE Final    Comment: (NOTE) SARS-CoV-2 target nucleic acids are NOT DETECTED. The SARS-CoV-2 RNA is generally detectable in upper and lower respiratory specimens during the acute phase of infection. Negative results do not preclude SARS-CoV-2 infection, do not rule out co-infections with other pathogens, and should not be used as the sole basis for treatment or other patient management decisions. Negative results must be combined with clinical observations, patient history, and epidemiological information. The expected result is Negative. Fact Sheet for Patients: SugarRoll.be Fact Sheet for Healthcare Providers: https://www.woods-mathews.com/ This test is not yet approved or cleared by the [place] FDA and  has been authorized for detection and/or diagnosis of SARS-CoV-2 by FDA under an Emergency Use Authorization (EUA). This EUA will remain  in effect (meaning this test can be used) for the duration of the COVID-19 declaration under Section 56 4(b)(1) of the Act, 21 U.S.C. section 360bbb-3(b)(1), unless the authorization is terminated or revoked sooner. Performed at [facility], [place] [address], [place],  [place] [address]      Labs: Basic Metabolic Panel: Recent Labs  Lab 06/09/19 0935 06/10/19 0551 06/11/19 0603 06/12/19 0633 06/13/19 0521  NA 137 137 136 134* 137  K 4.1 3.6 3.5 3.6 3.6  CL 99 103 103 102 101  CO2 21* 23 24 23 24   GLUCOSE 212* 195* 139* 158* 156*  BUN 34* 30* 24* 17 15  CREATININE 1.27* 0.91 0.92 0.82 0.99  CALCIUM 9.3 8.3* 8.5* 8.2* 8.5*  MG  --  2.1  --   --   --   PHOS  --  2.6  --   --   --    Liver Function Tests: Recent Labs  Lab 06/09/19 0935 06/10/19 0551  AST 28  21  ALT 17 15  ALKPHOS 79 63  BILITOT 1.2 0.7  PROT 7.9 6.5  ALBUMIN 3.5 2.8*   Recent Labs  Lab 06/09/19 0935  LIPASE 15   No results for input(s): AMMONIA in the last 168 hours. CBC: Recent Labs  Lab 06/09/19 0935 06/10/19 0551 06/11/19 0603 06/12/19 0633 06/13/19 0521  WBC 12.0* 8.5 5.8 4.9 5.1  NEUTROABS  --  5.8  --   --  3.7  HGB 13.5 12.0* 11.6* 11.0* 11.4*  HCT 41.1 37.4* 36.7* 34.6* 36.4*  MCV 93.4 95.7 96.1 96.1 96.6  PLT 198 186 204 184 237   Cardiac Enzymes: No results for input(s): CKTOTAL, CKMB, CKMBINDEX, TROPONINI in the last 168 hours. BNP: BNP (last 3 results) No results for input(s): BNP in the last 8760 hours.  ProBNP (last 3 results) No results for input(s): PROBNP in the last 8760 hours.  CBG: Recent Labs  Lab 06/12/19 1152 06/12/19 1725 06/12/19 2032 06/13/19 0806 06/13/19 1137  GLUCAP 190* 245* 219* 141* 189*       Signed:  Briant Cedar, MD [facility] 06/13/2019, 11:54 AM

## 2019-06-14 LAB — CULTURE, BLOOD (ROUTINE X 2)
Culture: NO GROWTH
Culture: NO GROWTH
Special Requests: ADEQUATE
Special Requests: ADEQUATE

## 2019-06-16 DIAGNOSIS — I82409 Acute embolism and thrombosis of unspecified deep veins of unspecified lower extremity: Secondary | ICD-10-CM | POA: Diagnosis not present

## 2019-06-16 DIAGNOSIS — E1169 Type 2 diabetes mellitus with other specified complication: Secondary | ICD-10-CM | POA: Diagnosis not present

## 2019-06-16 DIAGNOSIS — L0390 Cellulitis, unspecified: Secondary | ICD-10-CM | POA: Diagnosis not present

## 2019-06-16 DIAGNOSIS — R609 Edema, unspecified: Secondary | ICD-10-CM | POA: Diagnosis not present

## 2019-06-22 ENCOUNTER — Other Ambulatory Visit: Payer: Self-pay

## 2019-06-22 ENCOUNTER — Encounter (HOSPITAL_COMMUNITY): Payer: Self-pay | Admitting: Emergency Medicine

## 2019-06-22 ENCOUNTER — Emergency Department (HOSPITAL_COMMUNITY)
Admission: EM | Admit: 2019-06-22 | Discharge: 2019-06-22 | Disposition: A | Discharge status: Home / Self Care | Payer: PPO | Attending: Emergency Medicine | Admitting: Emergency Medicine

## 2019-06-22 DIAGNOSIS — Z5321 Procedure and treatment not carried out due to patient leaving prior to being seen by health care provider: Secondary | ICD-10-CM | POA: Insufficient documentation

## 2019-06-22 DIAGNOSIS — E1165 Type 2 diabetes mellitus with hyperglycemia: Secondary | ICD-10-CM | POA: Diagnosis not present

## 2019-06-22 DIAGNOSIS — R1111 Vomiting without nausea: Secondary | ICD-10-CM | POA: Diagnosis not present

## 2019-06-22 DIAGNOSIS — R110 Nausea: Secondary | ICD-10-CM | POA: Diagnosis not present

## 2019-06-22 DIAGNOSIS — R112 Nausea with vomiting, unspecified: Secondary | ICD-10-CM | POA: Diagnosis not present

## 2019-06-22 LAB — COMPREHENSIVE METABOLIC PANEL
ALT: 11 U/L (ref 0–44)
AST: 13 U/L — ABNORMAL LOW (ref 15–41)
Albumin: 2.8 g/dL — ABNORMAL LOW (ref 3.5–5.0)
Alkaline Phosphatase: 70 U/L (ref 38–126)
Anion gap: 16 — ABNORMAL HIGH (ref 5–15)
BUN: 12 mg/dL (ref 8–23)
CO2: 21 mmol/L — ABNORMAL LOW (ref 22–32)
Calcium: 8.5 mg/dL — ABNORMAL LOW (ref 8.9–10.3)
Chloride: 100 mmol/L (ref 98–111)
Creatinine, Ser: 0.84 mg/dL (ref 0.61–1.24)
GFR calc Af Amer: >60 mL/min (ref >60)
GFR calc non Af Amer: >60 mL/min (ref >60)
Glucose, Bld: 283 mg/dL — ABNORMAL HIGH (ref 70–99)
Potassium: 4 mmol/L (ref 3.5–5.1)
Sodium: 137 mmol/L (ref 135–145)
Total Bilirubin: 1.1 mg/dL (ref 0.3–1.2)
Total Protein: 6.7 g/dL (ref 6.5–8.1)

## 2019-06-22 LAB — CBC
HCT: 36.8 % — ABNORMAL LOW (ref 39.0–52.0)
Hemoglobin: 12.1 g/dL — ABNORMAL LOW (ref 13.0–17.0)
MCH: 30.9 pg (ref 26.0–34.0)
MCHC: 32.9 g/dL (ref 30.0–36.0)
MCV: 93.9 fL (ref 80.0–100.0)
Platelets: 390 10*3/uL (ref 150–400)
RBC: 3.92 MIL/uL — ABNORMAL LOW (ref 4.22–5.81)
RDW: 13 % (ref 11.5–15.5)
WBC: 8.7 10*3/uL (ref 4.0–10.5)
nRBC: 0 % (ref 0.0–0.2)

## 2019-06-22 LAB — LIPASE, BLOOD: Lipase: 14 U/L (ref 11–51)

## 2019-06-22 MED ORDER — SODIUM CHLORIDE 0.9% FLUSH: 3.0000 mL | Freq: Once | INTRAVENOUS | Status: DC

## 2019-06-22 NOTE — ED Notes (Signed)
PT reported to registration that he wished to leave at this time. IV access removed from Right forearm.

## 2019-06-22 NOTE — ED Triage Notes (Signed)
Per EMS- Patient c/o N/V x 12 hours. Patient was given Zofran 4 mg IV prior to arrival to the ED.  Patient has a history of gastroparesis.

## 2019-06-29 DIAGNOSIS — R609 Edema, unspecified: Secondary | ICD-10-CM | POA: Diagnosis not present

## 2019-06-29 DIAGNOSIS — L03115 Cellulitis of right lower limb: Secondary | ICD-10-CM | POA: Diagnosis not present

## 2019-07-02 ENCOUNTER — Other Ambulatory Visit: Payer: Self-pay | Admitting: *Deleted

## 2019-07-02 ENCOUNTER — Encounter: Payer: Self-pay | Admitting: *Deleted

## 2019-07-02 NOTE — Patient Outreach (Signed)
Telephone outreach per [facility] referral for hospital follow up.  Pt did not answer my call but I was able to leave a message and requested a return call.  Receiving home health services? Palliative Care? Hospice?  Samuel [name]. Samuel Estelle, MSN, [facility] Gerontological Nurse Practitioner [facility] Care Management [phone]

## 2019-07-13 DIAGNOSIS — R609 Edema, unspecified: Secondary | ICD-10-CM | POA: Diagnosis not present

## 2019-07-13 DIAGNOSIS — L03115 Cellulitis of right lower limb: Secondary | ICD-10-CM | POA: Diagnosis not present

## 2019-08-09 ENCOUNTER — Emergency Department (HOSPITAL_COMMUNITY)
Admission: EM | Admit: 2019-08-09 | Discharge: 2019-08-09 | Disposition: A | Discharge status: Home / Self Care | Payer: PPO | Attending: Emergency Medicine | Admitting: Emergency Medicine

## 2019-08-09 ENCOUNTER — Other Ambulatory Visit: Payer: Self-pay

## 2019-08-09 ENCOUNTER — Encounter (HOSPITAL_COMMUNITY): Payer: Self-pay

## 2019-08-09 DIAGNOSIS — Z794 Long term (current) use of insulin: Secondary | ICD-10-CM | POA: Insufficient documentation

## 2019-08-09 DIAGNOSIS — I119 Hypertensive heart disease without heart failure: Secondary | ICD-10-CM | POA: Diagnosis not present

## 2019-08-09 DIAGNOSIS — R1084 Generalized abdominal pain: Secondary | ICD-10-CM | POA: Diagnosis not present

## 2019-08-09 DIAGNOSIS — K3184 Gastroparesis: Secondary | ICD-10-CM | POA: Insufficient documentation

## 2019-08-09 DIAGNOSIS — R112 Nausea with vomiting, unspecified: Secondary | ICD-10-CM | POA: Diagnosis not present

## 2019-08-09 DIAGNOSIS — Z79899 Other long term (current) drug therapy: Secondary | ICD-10-CM | POA: Insufficient documentation

## 2019-08-09 DIAGNOSIS — R079 Chest pain, unspecified: Secondary | ICD-10-CM | POA: Diagnosis not present

## 2019-08-09 DIAGNOSIS — E1143 Type 2 diabetes mellitus with diabetic autonomic (poly)neuropathy: Secondary | ICD-10-CM | POA: Insufficient documentation

## 2019-08-09 DIAGNOSIS — I2510 Atherosclerotic heart disease of native coronary artery without angina pectoris: Secondary | ICD-10-CM | POA: Insufficient documentation

## 2019-08-09 DIAGNOSIS — R0789 Other chest pain: Secondary | ICD-10-CM | POA: Diagnosis not present

## 2019-08-09 DIAGNOSIS — R197 Diarrhea, unspecified: Secondary | ICD-10-CM | POA: Insufficient documentation

## 2019-08-09 DIAGNOSIS — E1165 Type 2 diabetes mellitus with hyperglycemia: Secondary | ICD-10-CM | POA: Diagnosis not present

## 2019-08-09 DIAGNOSIS — R110 Nausea: Secondary | ICD-10-CM | POA: Diagnosis not present

## 2019-08-09 LAB — COMPREHENSIVE METABOLIC PANEL
ALT: 19 U/L (ref 0–44)
AST: 19 U/L (ref 15–41)
Albumin: 3.4 g/dL — ABNORMAL LOW (ref 3.5–5.0)
Alkaline Phosphatase: 76 U/L (ref 38–126)
Anion gap: 22 — ABNORMAL HIGH (ref 5–15)
BUN: 18 mg/dL (ref 8–23)
CO2: 18 mmol/L — ABNORMAL LOW (ref 22–32)
Calcium: 9.2 mg/dL (ref 8.9–10.3)
Chloride: 95 mmol/L — ABNORMAL LOW (ref 98–111)
Creatinine, Ser: 1.12 mg/dL (ref 0.61–1.24)
GFR calc Af Amer: >60 mL/min (ref >60)
GFR calc non Af Amer: >60 mL/min (ref >60)
Glucose, Bld: 415 mg/dL — ABNORMAL HIGH (ref 70–99)
Potassium: 4.7 mmol/L (ref 3.5–5.1)
Sodium: 135 mmol/L (ref 135–145)
Total Bilirubin: 2.1 mg/dL — ABNORMAL HIGH (ref 0.3–1.2)
Total Protein: 7.7 g/dL (ref 6.5–8.1)

## 2019-08-09 LAB — URINALYSIS, MICROSCOPIC (REFLEX): RBC / HPF: NONE SEEN RBC/hpf (ref 0–5)

## 2019-08-09 LAB — CBG MONITORING, ED
Glucose-Capillary: 398 mg/dL — ABNORMAL HIGH (ref 70–99)
Glucose-Capillary: 294 mg/dL — ABNORMAL HIGH (ref 70–99)
Glucose-Capillary: 327 mg/dL — ABNORMAL HIGH (ref 70–99)

## 2019-08-09 LAB — URINALYSIS, ROUTINE W REFLEX MICROSCOPIC
Glucose, UA: >=500 mg/dL — AB
Hgb urine dipstick: NEGATIVE
Ketones, ur: >80 mg/dL — AB
Leukocytes,Ua: NEGATIVE
Nitrite: NEGATIVE
Protein, ur: 30 mg/dL — AB
Specific Gravity, Urine: 1.02 (ref 1.005–1.030)
pH: 5.5 (ref 5.0–8.0)

## 2019-08-09 LAB — POCT I-STAT EG7
Bicarbonate: 22.4 mmol/L (ref 20.0–28.0)
Calcium, Ion: 1.07 mmol/L — ABNORMAL LOW (ref 1.15–1.40)
HCT: 44 % (ref 39.0–52.0)
Hemoglobin: 15 g/dL (ref 13.0–17.0)
O2 Saturation: 78 %
Potassium: 4.9 mmol/L (ref 3.5–5.1)
Sodium: 134 mmol/L — ABNORMAL LOW (ref 135–145)
TCO2: 23 mmol/L (ref 22–32)
pCO2, Ven: 30.3 mmHg — ABNORMAL LOW (ref 44.0–60.0)
pH, Ven: 7.477 — ABNORMAL HIGH (ref 7.250–7.430)
pO2, Ven: 39 mmHg (ref 32.0–45.0)

## 2019-08-09 LAB — CBC
HCT: 44.1 % (ref 39.0–52.0)
Hemoglobin: 14.6 g/dL (ref 13.0–17.0)
MCH: 30.7 pg (ref 26.0–34.0)
MCHC: 33.1 g/dL (ref 30.0–36.0)
MCV: 92.6 fL (ref 80.0–100.0)
Platelets: 234 10*3/uL (ref 150–400)
RBC: 4.76 MIL/uL (ref 4.22–5.81)
RDW: 12.6 % (ref 11.5–15.5)
WBC: 10 10*3/uL (ref 4.0–10.5)
nRBC: 0 % (ref 0.0–0.2)

## 2019-08-09 LAB — LIPASE, BLOOD: Lipase: 14 U/L (ref 11–51)

## 2019-08-09 MED ORDER — SODIUM CHLORIDE 0.9% FLUSH: 3.0000 mL | Freq: Once | INTRAVENOUS | Status: DC

## 2019-08-09 MED ORDER — SODIUM CHLORIDE 0.9 % IV BOLUS (SEPSIS)
1000.0000 mL | Freq: Once | INTRAVENOUS | Status: AC
  Administered 2019-08-09: 1000 mL via INTRAVENOUS

## 2019-08-09 MED ORDER — INSULIN ASPART 100 UNIT/ML ~~LOC~~ SOLN
10.0000 [IU] | Freq: Once | SUBCUTANEOUS | Status: AC
  Administered 2019-08-09: 10 [IU] via SUBCUTANEOUS

## 2019-08-09 MED ORDER — ONDANSETRON HCL 4 MG/2ML IJ SOLN
4.0000 mg | Freq: Once | INTRAMUSCULAR | Status: AC
  Administered 2019-08-09: 4 mg via INTRAVENOUS
  Filled 2019-08-09: qty 2

## 2019-08-09 NOTE — ED Notes (Signed)
ivs removed

## 2019-08-09 NOTE — Discharge Instructions (Addendum)
Monitor your blood sugar at home. Take your medications as prescribed.  Follow up with your doctor tomorrow. Return to ER for new or worsening symptoms.

## 2019-08-09 NOTE — ED Notes (Signed)
Water  Was given  Pt was thirsty

## 2019-08-09 NOTE — ED Triage Notes (Signed)
Patient arrived by [facility] for abd. Pain with emesis that started on Friday. Patient received 4mg  zofran prior to arrival. No emesis on arrival.

## 2019-08-09 NOTE — ED Provider Notes (Signed)
[facility] EMERGENCY DEPARTMENT Provider Note   CSN: [id number] Arrival date & time: 08/09/19  1326     History Chief Complaint  Patient presents with  . Abdominal Pain  . Emesis    Samuel [name] is a 69 y.o. male.  Patient is a 69 year old gentleman with past medical history of insulin-dependent type 2 diabetes, gastroparesis pulmonary embolism, hypertension anxiety, arthritis, presenting to the emergency department for abdominal pain, nausea, vomiting for 3 days.  Patient reports that blood sugars have been elevated.  Reports generalized abdominal pain which feels different from his gastroparesis.  Denies any blood in stool, fever, chills, URI symptoms.        Past Medical History:  Diagnosis Date  . Anxiety   . Arthritis    "all over"   . CAD (coronary artery disease)   . Charcot's joint    "left foot"  . 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 Depression   . Gastroparesis   . GERD (gastroesophageal reflux disease)   . H/O hiatal hernia   . Hyperlipidemia   . Hypertension   . Myocardial infarction (HCC) 2017/03/27   around this date  . OSA (obstructive sleep apnea)    "not bad enough for a mask"  . Peripheral neuropathy   . Peripheral vascular disease (HCC)   . PONV (postoperative nausea and vomiting)   . Pulmonary embolism (HCC)    hx. of 2012  . Shortness of breath    exertion  . Type II diabetes mellitus [facility])     Patient Active Problem List   Diagnosis Date Noted  . DVT (deep venous thrombosis) (HCC) 06/09/2019  . DKA (diabetic ketoacidoses) (HCC) 10/01/2018  . Gastroparesis 08/17/2018  . Chest pain 08/16/2018  . DKA, type 2 (HCC) 02/03/2018  . DOE (dyspnea on exertion) 01/24/2018  . Uncontrolled diabetes mellitus (HCC) 09/23/2017  . DKA, type 2, not at goal [facility]) 09/22/2017  . S/P BKA (below knee amputation) (HCC) 04/19/2017  . COPD (chronic obstructive pulmonary disease) (HCC) 04/19/2017  . NSTEMI (non-ST  elevated myocardial infarction) (HCC) 04/07/2017  . Osteomyelitis of foot (HCC)   . Protein-calorie malnutrition, severe (HCC) 03/08/2015  . Osteomyelitis of foot, acute (HCC) 03/07/2015  . Diabetes mellitus type 2, uncontrolled (HCC) 03/07/2015  . Normocytic normochromic anemia 03/07/2015  . Obesity (BMI 30-39.9) 03/07/2015  . Nausea & vomiting   . DM (diabetes mellitus), type 2 (HCC) 01/07/2014  . Chronic osteomyelitis involving lower leg (HCC) 01/07/2014  . Vomiting 12/22/2013  . Diabetic gastroparesis (HCC) 12/21/2013  . Intractable nausea and vomiting 12/21/2013  . Essential hypertension 05/01/2013  . Hyperlipidemia due to type 2 diabetes mellitus (HCC) 05/01/2013  . Venous stasis of lower extremity 04/15/2013  . Generalized weakness 03/20/2013  . Back pain 03/20/2013  . Pulmonary embolism (HCC) 03/27/2011  . PERFORATION OF GALLBLADDER 08/30/2010  . Ulcer of lower limb, unspecified 03/01/2010  . METHICILLIN SUSCEPTIBLE STAPH AUREUS SEPTICEMIA 01/25/2010  . Acute osteomyelitis, ankle and foot 01/25/2010  . NAUSEA AND VOMITING 01/25/2010  . GERD 11/09/2009  . CAD S/P DES PCI to mLAD: Xience DES 2.75 x 15 (3.0 mm) 05/01/2009  . DIABETES, TYPE 1 05/14/2008  . OBSTRUCTIVE SLEEP APNEA 04/30/2008  . ALLERGIC RHINITIS, SEASONAL 04/30/2008    Past Surgical History:  Procedure Laterality Date  . AMPUTATION Left 03/08/2015   Procedure:  LEFT AMPUTATION BELOW KNEE;  Surgeon: Toni Arthurs, MD;  Location: [facility] ORS;  Service: Orthopedics;  Laterality: Left;  .  APPLICATION OF WOUND VAC Left 01/07/2014  . CHOLECYSTECTOMY  06/2010  . CORONARY ANGIOPLASTY WITH STENT PLACEMENT  05/2009   "1"  . FOOT SURGERY Left 2010   "for Charcot's joint"  . HARDWARE REMOVAL Left 01/07/2014   Procedure: LEFT LEG REMOVAL OF DEEP IMPLANT AND SEQUESTRECTOMY; APPLICATION OF WOUND VAC ;  Surgeon: Toni Arthurs, MD;  Location: [facility] OR;  Service: Orthopedics;  Laterality: Left;  . I & D EXTREMITY Left "multiple"   leg    . 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 IM NAILING TIBIA Left ~ 2012  . LEFT HEART CATH AND CORONARY ANGIOGRAPHY N/A 04/08/2017   Procedure: LEFT HEART CATH AND CORONARY ANGIOGRAPHY;  Surgeon: Runell Gess, MD;  Location: [facility] INVASIVE CV LAB;  Service: Cardiovascular;  Laterality: N/A;  . SHOULDER ARTHROSCOPY W/ ROTATOR CUFF REPAIR Left    "and bone spurs"  . TIBIAL IM ROD REMOVAL Left 01/07/2014  . TOE AMPUTATION Right ~ 2011   "great toe"  . VENA CAVA FILTER PLACEMENT  2012  . VENA CAVA FILTER PLACEMENT N/A 11/20/2016   Procedure: INSERTION VENA-CAVA FILTER;  Surgeon: Sherren Kerns, MD;  Location: [facility] OR;  Service: Vascular;  Laterality: N/A;  . WOUND DEBRIDEMENT Left 01/07/2014   "tibia"       Family History  Problem Relation Age of Onset  . COPD Mother   . Heart disease Mother   . Lung cancer Mother   . Retinal detachment Father   . Alcoholism Brother   . Heart disease Brother   . COPD Brother   . Diabetes Brother   . Hypertension Brother   . Stroke Brother     Social History   Tobacco Use  . Smoking status: Never Smoker  . Smokeless tobacco: Never Used  Substance Use Topics  . Alcohol use: No    Comment: 01/07/2014 'might have a wine cooler a couple times/yr"  . Drug use: No    Home Medications Prior to Admission medications   Medication Sig Start Date End Date Taking? Authorizing Provider  amLODipine (NORVASC) 10 MG tablet Take 1 tablet (10 mg total) by mouth daily. 11/22/16   Richarda Overlie, MD  Apixaban Starter Pack (ELIQUIS STARTER PACK) 5 MG TBPK Take as directed on package: start with two-5mg  tablets twice daily for 7 days. On day 8, switch to one-5mg  tablet twice daily. 06/13/19   Briant Cedar, MD  aspirin 81 MG EC tablet Take 1 tablet (81 mg total) by mouth daily. 04/12/17   Glade Lloyd, MD   atorvastatin (LIPITOR) 80 MG tablet Take 1 tablet (80 mg total) by mouth daily at 6 PM. 04/11/17   Glade Lloyd, MD  clopidogrel (PLAVIX) 75 MG tablet Take 75 mg by mouth daily.    [provider]  docusate sodium (COLACE) 100 MG capsule Take 100 mg by mouth daily.    [provider]  ferrous sulfate 325 (65 FE) MG tablet Take 325 mg by mouth every evening.     [provider]  furosemide (LASIX) 20 MG tablet Take 1 tablet (20 mg total) by mouth daily. Patient taking differently: Take 20 mg by mouth daily as needed for fluid or edema.  04/12/17   Glade Lloyd, MD  gabapentin (NEURONTIN) 300 MG capsule Take 600 mg by mouth 2 (two) times daily.  [provider]  Insulin Glargine (BASAGLAR KWIKPEN) 100 UNIT/ML SOPN Inject 40 Units into the skin daily.    Averneni, Carmon Sails, MD  insulin lispro (HUMALOG) 100 UNIT/ML injection Inject 5-18 Units into the skin See admin instructions. units three times daily before meals Sliding Scale 150-250 = 5 units 251-350= 6-8 units 351-450= 10 units Over 450 18 units    Averneni, Madhavi, MD  metFORMIN (GLUCOPHAGE-XR) 500 MG 24 hr tablet Take 500 mg by mouth daily.    [provider]  metoprolol tartrate (LOPRESSOR) 25 MG tablet Take 1 tablet (25 mg total) by mouth 2 (two) times daily. 04/11/17   Glade Lloyd, MD  nitroGLYCERIN (NITROSTAT) 0.4 MG SL tablet Place 1 tablet (0.4 mg total) under the tongue every 5 (five) minutes as needed for chest pain. 04/11/17   Glade Lloyd, MD  [facility] VERIO test strip Use to test blood sugar twice daily 09/24/17   [provider]  pantoprazole (PROTONIX) 40 MG tablet Take 1 tablet (40 mg total) by mouth 2 (two) times daily before a meal. 08/21/18   Mikhail, Nita Sells, DO  promethazine (PHENERGAN) 25 MG suppository Place 1 suppository (25 mg total) rectally every 6 (six) hours as needed for nausea or vomiting. 04/18/18   Long, Arlyss Repress, MD  promethazine (PHENERGAN) 25 MG  tablet Take 25-50 mg by mouth every 6 (six) hours as needed for nausea or vomiting.  05/05/18   [provider]  promethazine (PHENERGAN) 25 MG/ML injection Inject 25 mg into the muscle every 6 (six) hours as needed for nausea/vomiting. 04/02/19   [provider]  ranolazine (RANEXA) 500 MG 12 hr tablet Take 1 tablet (500 mg total) by mouth 2 (two) times daily. 04/11/17   Glade Lloyd, MD  traMADol (ULTRAM) 50 MG tablet Take 100 mg by mouth every 6 (six) hours as needed for moderate pain.     [provider]    Allergies    Gabapentin, Relafen [nabumetone], and Codeine  Review of Systems   Review of Systems  Constitutional: Negative for chills and fever.  HENT: Negative for congestion and sore throat.   Respiratory: Negative for cough and shortness of breath.   Cardiovascular: Negative for chest pain.  Gastrointestinal: Positive for abdominal pain, diarrhea, nausea and vomiting.  Endocrine: Positive for polyuria.  Genitourinary: Negative for dysuria.  Musculoskeletal: Negative for back pain.  Skin: Negative for rash.  Neurological: Negative for dizziness.  Hematological: Does not bruise/bleed easily.  All other systems reviewed and are negative.   Physical Exam Updated Vital Signs BP (!) 164/86   Pulse 97   Temp 98.8 F (37.1 C) (Oral)   Resp 16   SpO2 98%   Physical Exam Vitals and nursing note reviewed.  Constitutional:      General: He is not in acute distress.    Appearance: Normal appearance. He is ill-appearing. He is not toxic-appearing or diaphoretic.     Comments: Appears uncomfortable  HENT:     Head: Normocephalic.     Mouth/Throat:     Comments: Dry mucous membrane Eyes:     Extraocular Movements: Extraocular movements intact.     Conjunctiva/sclera: Conjunctivae normal.  Cardiovascular:     Rate and Rhythm: Regular rhythm. Tachycardia present.  Pulmonary:     Effort: Pulmonary effort is normal.  Abdominal:     General:  Abdomen is flat.     Palpations: Abdomen is soft.     Tenderness: There is abdominal tenderness in the left upper quadrant.  There is no guarding or rebound.  Skin:    General: Skin is dry.  Neurological:     General: No focal deficit present.     Mental Status: He is alert.  Psychiatric:        Mood and Affect: Mood normal.     ED Results / Procedures / Treatments   Labs (all labs ordered are listed, but only abnormal results are displayed) Labs Reviewed  COMPREHENSIVE METABOLIC PANEL - Abnormal; Notable for the following components:      Result Value   Chloride 95 (*)    CO2 18 (*)    Glucose, Bld 415 (*)    Albumin 3.4 (*)    Total Bilirubin 2.1 (*)    Anion gap 22 (*)    All other components within normal limits  CBG MONITORING, ED - Abnormal; Notable for the following components:   Glucose-Capillary 398 (*)    All other components within normal limits  POCT I-STAT EG7 - Abnormal; Notable for the following components:   pH, Ven 7.477 (*)    pCO2, Ven 30.3 (*)    Sodium 134 (*)    Calcium, Ion 1.07 (*)    All other components within normal limits  LIPASE, BLOOD  CBC  URINALYSIS, ROUTINE W REFLEX MICROSCOPIC  BLOOD GAS, VENOUS    EKG None  Radiology No results found.  Procedures Procedures (including critical care time)  Medications Ordered in ED Medications  sodium chloride flush (NS) 0.9 % injection 3 mL (3 mLs Intravenous Not Given 08/09/19 1424)  sodium chloride 0.9 % bolus 1,000 mL (1,000 mLs Intravenous New Bag/Given 08/09/19 1422)  sodium chloride 0.9 % bolus 1,000 mL (1,000 mLs Intravenous New Bag/Given 08/09/19 1504)  ondansetron (ZOFRAN) injection 4 mg (4 mg Intravenous Given 08/09/19 1423)  insulin aspart (novoLOG) injection 10 Units (10 Units Subcutaneous Given 08/09/19 1503)    ED Course  I have reviewed the triage vital signs and the nursing notes.  Pertinent labs & imaging results that were available during my care of the patient were reviewed  by me and considered in my medical decision making (see chart for details).  Clinical Course as of Aug 08 1518  Sun Aug 08, 5208  [id number] 69 year old insulin-dependent type 2 diabetic presenting with nausea and vomiting and diarrhea for the past couple of days.  Differential including DKA versus gastroparesis versus other intra-abdominal process.  Glucose is 415 with an anion gap of 22 and decreased Co2 but PH not acidotic.  Normal bicarb..  Initiated fluids, insulin. Hydrate and reassess. May need admission if no improvement   [KM]  1520 Care passed to Suella Broad PA due to change of shift   [KM]    Clinical Course User Index [KM] Kristine Royal   MDM Rules/Calculators/A&P                       Final Clinical Impression(s) / ED Diagnoses Final diagnoses:  None    Rx / DC Orders ED Discharge Orders    None       Kristine Royal 08/09/19 1520    Pattricia Boss, MD 08/09/19 1555

## 2019-08-09 NOTE — ED Provider Notes (Signed)
69yo male with n/v x a few days similar to prior gastroparesis. Mild DKA, getting fluids and insulin. Previously, improves in the ED and refuses admission. Plan is to hydrate and reassess, may need admission.  Physical Exam  BP (!) 164/86   Pulse 97   Temp 98.8 F (37.1 C) (Oral)   Resp 16   SpO2 98%   Physical Exam  ED Course/Procedures   Clinical Course as of Aug 08 1748  Sun Aug 09, 2019  [id number] 69 year old insulin-dependent type 2 diabetic presenting with nausea and vomiting and diarrhea for the past couple of days.  Differential including DKA versus gastroparesis versus other intra-abdominal process.  Glucose is 415 with an anion gap of 22 and decreased Co2 but PH not acidotic.  Normal bicarb..  Initiated fluids, insulin. Hydrate and reassess. May need admission if no improvement   [KM]  1520 Care passed to Army Melia PA due to change of shift   [KM]    Clinical Course User Index [KM] Arlyn Dunning, PA-C    Procedures  MDM  Patient is tolerating p.o. fluids, feeling better and would like to be discharged at this time.  Patient states that he has plenty of test strips at home as well as his medications, will test his blood sugar and take his medications as prescribed and contact his doctor tomorrow to follow-up, agrees to return to ER for any new or worsening symptoms.  Sugar is improving, will plan for discharge. Patient does not want to wait any longer for his blood sugar to improve, has called a ride and would like his discharge papers now.      Jeannie Fend, PA-C 08/09/19 1751    Jacalyn Lefevre, MD 08/09/19 [phone]

## 2019-08-09 NOTE — ED Notes (Signed)
The pt is asking for water

## 2019-08-18 DIAGNOSIS — L03115 Cellulitis of right lower limb: Secondary | ICD-10-CM | POA: Diagnosis not present

## 2019-08-18 DIAGNOSIS — S81811A Laceration without foreign body, right lower leg, initial encounter: Secondary | ICD-10-CM | POA: Diagnosis not present

## 2019-08-18 DIAGNOSIS — R609 Edema, unspecified: Secondary | ICD-10-CM | POA: Diagnosis not present

## 2019-09-07 ENCOUNTER — Emergency Department (HOSPITAL_COMMUNITY)
Admission: EM | Admit: 2019-09-07 | Discharge: 2019-09-07 | Disposition: A | Discharge status: Home / Self Care | Payer: PPO | Attending: Emergency Medicine | Admitting: Emergency Medicine

## 2019-09-07 DIAGNOSIS — R109 Unspecified abdominal pain: Secondary | ICD-10-CM | POA: Diagnosis not present

## 2019-09-07 DIAGNOSIS — I10 Essential (primary) hypertension: Secondary | ICD-10-CM | POA: Diagnosis not present

## 2019-09-07 DIAGNOSIS — Z5321 Procedure and treatment not carried out due to patient leaving prior to being seen by health care provider: Secondary | ICD-10-CM | POA: Insufficient documentation

## 2019-09-07 DIAGNOSIS — R112 Nausea with vomiting, unspecified: Secondary | ICD-10-CM | POA: Insufficient documentation

## 2019-09-07 DIAGNOSIS — R1084 Generalized abdominal pain: Secondary | ICD-10-CM | POA: Diagnosis not present

## 2019-09-07 DIAGNOSIS — E1165 Type 2 diabetes mellitus with hyperglycemia: Secondary | ICD-10-CM | POA: Diagnosis not present

## 2019-09-07 DIAGNOSIS — R52 Pain, unspecified: Secondary | ICD-10-CM | POA: Diagnosis not present

## 2019-09-07 LAB — COMPREHENSIVE METABOLIC PANEL
ALT: 14 U/L (ref 0–44)
AST: 17 U/L (ref 15–41)
Albumin: 3.4 g/dL — ABNORMAL LOW (ref 3.5–5.0)
Alkaline Phosphatase: 62 U/L (ref 38–126)
Anion gap: 14 (ref 5–15)
BUN: 12 mg/dL (ref 8–23)
CO2: 24 mmol/L (ref 22–32)
Calcium: 9.3 mg/dL (ref 8.9–10.3)
Chloride: 97 mmol/L — ABNORMAL LOW (ref 98–111)
Creatinine, Ser: 0.98 mg/dL (ref 0.61–1.24)
GFR calc Af Amer: >60 mL/min (ref >60)
GFR calc non Af Amer: >60 mL/min (ref >60)
Glucose, Bld: 317 mg/dL — ABNORMAL HIGH (ref 70–99)
Potassium: 4 mmol/L (ref 3.5–5.1)
Sodium: 135 mmol/L (ref 135–145)
Total Bilirubin: 1.2 mg/dL (ref 0.3–1.2)
Total Protein: 7.7 g/dL (ref 6.5–8.1)

## 2019-09-07 LAB — CBC
HCT: 46.6 % (ref 39.0–52.0)
Hemoglobin: 15 g/dL (ref 13.0–17.0)
MCH: 29.9 pg (ref 26.0–34.0)
MCHC: 32.2 g/dL (ref 30.0–36.0)
MCV: 93 fL (ref 80.0–100.0)
Platelets: 283 10*3/uL (ref 150–400)
RBC: 5.01 MIL/uL (ref 4.22–5.81)
RDW: 12.5 % (ref 11.5–15.5)
WBC: 7.9 10*3/uL (ref 4.0–10.5)
nRBC: 0 % (ref 0.0–0.2)

## 2019-09-07 LAB — LIPASE, BLOOD: Lipase: 15 U/L (ref 11–51)

## 2019-09-07 MED ORDER — SODIUM CHLORIDE 0.9% FLUSH: 3.0000 mL | Freq: Once | INTRAVENOUS | Status: DC

## 2019-09-07 NOTE — ED Notes (Signed)
Pt received 300ns and 4mg  of zofran by ems

## 2019-09-07 NOTE — ED Triage Notes (Signed)
Pt here from EMS with c/o n/v/d from home , pt has chronic flair up of the same , was here 1 month ago with the same

## 2019-09-23 ENCOUNTER — Other Ambulatory Visit: Payer: Self-pay

## 2019-09-23 ENCOUNTER — Emergency Department (HOSPITAL_COMMUNITY): Payer: PPO

## 2019-09-23 ENCOUNTER — Inpatient Hospital Stay (HOSPITAL_COMMUNITY)
Admission: EM | Admit: 2019-09-23 | Discharge: 2019-09-25 | DRG: 639 | Disposition: A | Discharge status: Home / Self Care | Payer: PPO | Attending: Internal Medicine | Admitting: Internal Medicine

## 2019-09-23 ENCOUNTER — Encounter (HOSPITAL_COMMUNITY): Payer: Self-pay

## 2019-09-23 DIAGNOSIS — M1990 Unspecified osteoarthritis, unspecified site: Secondary | ICD-10-CM | POA: Diagnosis present

## 2019-09-23 DIAGNOSIS — I825Y1 Chronic embolism and thrombosis of unspecified deep veins of right proximal lower extremity: Secondary | ICD-10-CM

## 2019-09-23 DIAGNOSIS — E1110 Type 2 diabetes mellitus with ketoacidosis without coma: Principal | ICD-10-CM | POA: Diagnosis present

## 2019-09-23 DIAGNOSIS — Z89411 Acquired absence of right great toe: Secondary | ICD-10-CM

## 2019-09-23 DIAGNOSIS — Z86711 Personal history of pulmonary embolism: Secondary | ICD-10-CM | POA: Diagnosis not present

## 2019-09-23 DIAGNOSIS — R0689 Other abnormalities of breathing: Secondary | ICD-10-CM | POA: Diagnosis not present

## 2019-09-23 DIAGNOSIS — Z66 Do not resuscitate: Secondary | ICD-10-CM | POA: Diagnosis present

## 2019-09-23 DIAGNOSIS — Z794 Long term (current) use of insulin: Secondary | ICD-10-CM | POA: Diagnosis not present

## 2019-09-23 DIAGNOSIS — K3184 Gastroparesis: Secondary | ICD-10-CM | POA: Diagnosis present

## 2019-09-23 DIAGNOSIS — I2699 Other pulmonary embolism without acute cor pulmonale: Secondary | ICD-10-CM | POA: Diagnosis present

## 2019-09-23 DIAGNOSIS — Z7902 Long term (current) use of antithrombotics/antiplatelets: Secondary | ICD-10-CM

## 2019-09-23 DIAGNOSIS — Z86718 Personal history of other venous thrombosis and embolism: Secondary | ICD-10-CM | POA: Diagnosis not present

## 2019-09-23 DIAGNOSIS — Z801 Family history of malignant neoplasm of trachea, bronchus and lung: Secondary | ICD-10-CM

## 2019-09-23 DIAGNOSIS — Z79891 Long term (current) use of opiate analgesic: Secondary | ICD-10-CM

## 2019-09-23 DIAGNOSIS — E1143 Type 2 diabetes mellitus with diabetic autonomic (poly)neuropathy: Secondary | ICD-10-CM | POA: Diagnosis present

## 2019-09-23 DIAGNOSIS — I2510 Atherosclerotic heart disease of native coronary artery without angina pectoris: Secondary | ICD-10-CM

## 2019-09-23 DIAGNOSIS — R739 Hyperglycemia, unspecified: Secondary | ICD-10-CM | POA: Diagnosis not present

## 2019-09-23 DIAGNOSIS — Z8249 Family history of ischemic heart disease and other diseases of the circulatory system: Secondary | ICD-10-CM

## 2019-09-23 DIAGNOSIS — G4733 Obstructive sleep apnea (adult) (pediatric): Secondary | ICD-10-CM | POA: Diagnosis not present

## 2019-09-23 DIAGNOSIS — Z7982 Long term (current) use of aspirin: Secondary | ICD-10-CM

## 2019-09-23 DIAGNOSIS — E1169 Type 2 diabetes mellitus with other specified complication: Secondary | ICD-10-CM

## 2019-09-23 DIAGNOSIS — Z7901 Long term (current) use of anticoagulants: Secondary | ICD-10-CM

## 2019-09-23 DIAGNOSIS — Y92009 Unspecified place in unspecified non-institutional (private) residence as the place of occurrence of the external cause: Secondary | ICD-10-CM

## 2019-09-23 DIAGNOSIS — Z955 Presence of coronary angioplasty implant and graft: Secondary | ICD-10-CM

## 2019-09-23 DIAGNOSIS — I2782 Chronic pulmonary embolism: Secondary | ICD-10-CM | POA: Diagnosis not present

## 2019-09-23 DIAGNOSIS — Z79899 Other long term (current) drug therapy: Secondary | ICD-10-CM

## 2019-09-23 DIAGNOSIS — T383X6A Underdosing of insulin and oral hypoglycemic [antidiabetic] drugs, initial encounter: Secondary | ICD-10-CM | POA: Diagnosis not present

## 2019-09-23 DIAGNOSIS — Z89519 Acquired absence of unspecified leg below knee: Secondary | ICD-10-CM

## 2019-09-23 DIAGNOSIS — Z20822 Contact with and (suspected) exposure to covid-19: Secondary | ICD-10-CM | POA: Diagnosis present

## 2019-09-23 DIAGNOSIS — Z885 Allergy status to narcotic agent status: Secondary | ICD-10-CM

## 2019-09-23 DIAGNOSIS — R000 Tachycardia, unspecified: Secondary | ICD-10-CM | POA: Diagnosis not present

## 2019-09-23 DIAGNOSIS — I82409 Acute embolism and thrombosis of unspecified deep veins of unspecified lower extremity: Secondary | ICD-10-CM | POA: Diagnosis present

## 2019-09-23 DIAGNOSIS — Z9861 Coronary angioplasty status: Secondary | ICD-10-CM | POA: Diagnosis not present

## 2019-09-23 DIAGNOSIS — Z9049 Acquired absence of other specified parts of digestive tract: Secondary | ICD-10-CM

## 2019-09-23 DIAGNOSIS — E876 Hypokalemia: Secondary | ICD-10-CM | POA: Diagnosis present

## 2019-09-23 DIAGNOSIS — Z825 Family history of asthma and other chronic lower respiratory diseases: Secondary | ICD-10-CM

## 2019-09-23 DIAGNOSIS — E119 Type 2 diabetes mellitus without complications: Secondary | ICD-10-CM

## 2019-09-23 DIAGNOSIS — Z811 Family history of alcohol abuse and dependence: Secondary | ICD-10-CM

## 2019-09-23 DIAGNOSIS — I10 Essential (primary) hypertension: Secondary | ICD-10-CM | POA: Diagnosis present

## 2019-09-23 DIAGNOSIS — E11610 Type 2 diabetes mellitus with diabetic neuropathic arthropathy: Secondary | ICD-10-CM | POA: Diagnosis present

## 2019-09-23 DIAGNOSIS — Z89512 Acquired absence of left leg below knee: Secondary | ICD-10-CM

## 2019-09-23 DIAGNOSIS — R112 Nausea with vomiting, unspecified: Secondary | ICD-10-CM | POA: Diagnosis not present

## 2019-09-23 DIAGNOSIS — Z888 Allergy status to other drugs, medicaments and biological substances status: Secondary | ICD-10-CM | POA: Diagnosis not present

## 2019-09-23 DIAGNOSIS — J449 Chronic obstructive pulmonary disease, unspecified: Secondary | ICD-10-CM | POA: Diagnosis not present

## 2019-09-23 DIAGNOSIS — Z91128 Patient's intentional underdosing of medication regimen for other reason: Secondary | ICD-10-CM | POA: Diagnosis not present

## 2019-09-23 DIAGNOSIS — E1165 Type 2 diabetes mellitus with hyperglycemia: Secondary | ICD-10-CM

## 2019-09-23 DIAGNOSIS — K219 Gastro-esophageal reflux disease without esophagitis: Secondary | ICD-10-CM | POA: Diagnosis present

## 2019-09-23 DIAGNOSIS — E785 Hyperlipidemia, unspecified: Secondary | ICD-10-CM | POA: Diagnosis present

## 2019-09-23 DIAGNOSIS — Z993 Dependence on wheelchair: Secondary | ICD-10-CM

## 2019-09-23 DIAGNOSIS — E1151 Type 2 diabetes mellitus with diabetic peripheral angiopathy without gangrene: Secondary | ICD-10-CM | POA: Diagnosis present

## 2019-09-23 DIAGNOSIS — Z823 Family history of stroke: Secondary | ICD-10-CM

## 2019-09-23 DIAGNOSIS — R52 Pain, unspecified: Secondary | ICD-10-CM | POA: Diagnosis not present

## 2019-09-23 DIAGNOSIS — D649 Anemia, unspecified: Secondary | ICD-10-CM | POA: Diagnosis not present

## 2019-09-23 DIAGNOSIS — I252 Old myocardial infarction: Secondary | ICD-10-CM

## 2019-09-23 DIAGNOSIS — R197 Diarrhea, unspecified: Secondary | ICD-10-CM | POA: Diagnosis not present

## 2019-09-23 DIAGNOSIS — Z833 Family history of diabetes mellitus: Secondary | ICD-10-CM

## 2019-09-23 LAB — CBC WITH DIFFERENTIAL/PLATELET
Abs Immature Granulocytes: 0.05 10*3/uL (ref 0.00–0.07)
Basophils Absolute: 0 10*3/uL (ref 0.0–0.1)
Basophils Relative: 0 %
Eosinophils Absolute: 0 10*3/uL (ref 0.0–0.5)
Eosinophils Relative: 0 %
HCT: 47.4 % (ref 39.0–52.0)
Hemoglobin: 15.3 g/dL (ref 13.0–17.0)
Immature Granulocytes: 0 %
Lymphocytes Relative: 6 %
Lymphs Abs: 0.8 10*3/uL (ref 0.7–4.0)
MCH: 29.9 pg (ref 26.0–34.0)
MCHC: 32.3 g/dL (ref 30.0–36.0)
MCV: 92.6 fL (ref 80.0–100.0)
Monocytes Absolute: 0.4 10*3/uL (ref 0.1–1.0)
Monocytes Relative: 3 %
Neutro Abs: 13.1 10*3/uL — ABNORMAL HIGH (ref 1.7–7.7)
Neutrophils Relative %: 91 %
Platelets: 220 10*3/uL (ref 150–400)
RBC: 5.12 MIL/uL (ref 4.22–5.81)
RDW: 12.7 % (ref 11.5–15.5)
WBC: 14.3 10*3/uL — ABNORMAL HIGH (ref 4.0–10.5)
nRBC: 0 % (ref 0.0–0.2)

## 2019-09-23 LAB — BLOOD GAS, VENOUS
Acid-base deficit: 7.3 mmol/L — ABNORMAL HIGH (ref 0.0–2.0)
Bicarbonate: 16.5 mmol/L — ABNORMAL LOW (ref 20.0–28.0)
O2 Saturation: 88.3 %
Patient temperature: 98.6
pCO2, Ven: 30.1 mmHg — ABNORMAL LOW (ref 44.0–60.0)
pH, Ven: 7.357 (ref 7.250–7.430)
pO2, Ven: 63.8 mmHg — ABNORMAL HIGH (ref 32.0–45.0)

## 2019-09-23 LAB — CBG MONITORING, ED
Glucose-Capillary: 462 mg/dL — ABNORMAL HIGH (ref 70–99)
Glucose-Capillary: 433 mg/dL — ABNORMAL HIGH (ref 70–99)
Glucose-Capillary: 391 mg/dL — ABNORMAL HIGH (ref 70–99)
Glucose-Capillary: 421 mg/dL — ABNORMAL HIGH (ref 70–99)
Glucose-Capillary: 350 mg/dL — ABNORMAL HIGH (ref 70–99)
Glucose-Capillary: 292 mg/dL — ABNORMAL HIGH (ref 70–99)

## 2019-09-23 LAB — COMPREHENSIVE METABOLIC PANEL
ALT: 14 U/L (ref 0–44)
AST: 18 U/L (ref 15–41)
Albumin: 3.9 g/dL (ref 3.5–5.0)
Alkaline Phosphatase: 69 U/L (ref 38–126)
Anion gap: 25 — ABNORMAL HIGH (ref 5–15)
BUN: 20 mg/dL (ref 8–23)
CO2: 17 mmol/L — ABNORMAL LOW (ref 22–32)
Calcium: 9.6 mg/dL (ref 8.9–10.3)
Chloride: 97 mmol/L — ABNORMAL LOW (ref 98–111)
Creatinine, Ser: 1.16 mg/dL (ref 0.61–1.24)
GFR calc Af Amer: >60 mL/min (ref >60)
GFR calc non Af Amer: >60 mL/min (ref >60)
Glucose, Bld: 481 mg/dL — ABNORMAL HIGH (ref 70–99)
Potassium: 4.7 mmol/L (ref 3.5–5.1)
Sodium: 139 mmol/L (ref 135–145)
Total Bilirubin: 2 mg/dL — ABNORMAL HIGH (ref 0.3–1.2)
Total Protein: 7.9 g/dL (ref 6.5–8.1)

## 2019-09-23 LAB — URINALYSIS, ROUTINE W REFLEX MICROSCOPIC
Bacteria, UA: NONE SEEN
Bilirubin Urine: NEGATIVE
Glucose, UA: >=500 mg/dL — AB
Hgb urine dipstick: NEGATIVE
Ketones, ur: 80 mg/dL — AB
Leukocytes,Ua: NEGATIVE
Nitrite: NEGATIVE
Protein, ur: 100 mg/dL — AB
Specific Gravity, Urine: 1.025 (ref 1.005–1.030)
pH: 6 (ref 5.0–8.0)

## 2019-09-23 LAB — BASIC METABOLIC PANEL
Anion gap: 17 — ABNORMAL HIGH (ref 5–15)
BUN: 21 mg/dL (ref 8–23)
CO2: 19 mmol/L — ABNORMAL LOW (ref 22–32)
Calcium: 8.8 mg/dL — ABNORMAL LOW (ref 8.9–10.3)
Chloride: 104 mmol/L (ref 98–111)
Creatinine, Ser: 1.33 mg/dL — ABNORMAL HIGH (ref 0.61–1.24)
GFR calc Af Amer: >60 mL/min (ref >60)
GFR calc non Af Amer: 55 mL/min — ABNORMAL LOW (ref >60)
Glucose, Bld: 352 mg/dL — ABNORMAL HIGH (ref 70–99)
Potassium: 4 mmol/L (ref 3.5–5.1)
Sodium: 140 mmol/L (ref 135–145)

## 2019-09-23 LAB — BETA-HYDROXYBUTYRIC ACID: Beta-Hydroxybutyric Acid: 7.74 mmol/L — ABNORMAL HIGH (ref 0.05–0.27)

## 2019-09-23 LAB — LIPASE, BLOOD: Lipase: 14 U/L (ref 11–51)

## 2019-09-23 MED ORDER — SODIUM CHLORIDE 0.9 % IV BOLUS
1000.0000 mL | Freq: Once | INTRAVENOUS | Status: AC
  Administered 2019-09-23: 1000 mL via INTRAVENOUS

## 2019-09-23 MED ORDER — APIXABAN 5 MG PO TABS
5.0000 mg | ORAL_TABLET | Freq: Two times a day (BID) | ORAL | Status: DC
  Administered 2019-09-24 – 2019-09-25 (×4): 5 mg via ORAL
  Filled 2019-09-23 (×5): qty 1

## 2019-09-23 MED ORDER — DEXTROSE-NACL 5-0.45 % IV SOLN: INTRAVENOUS | Status: DC

## 2019-09-23 MED ORDER — FAMOTIDINE IN NACL 20-0.9 MG/50ML-% IV SOLN
20.0000 mg | Freq: Once | INTRAVENOUS | Status: AC
  Administered 2019-09-23: 20 mg via INTRAVENOUS
  Filled 2019-09-23: qty 50

## 2019-09-23 MED ORDER — SODIUM CHLORIDE 0.9 % IV SOLN: INTRAVENOUS | Status: DC

## 2019-09-23 MED ORDER — DEXTROSE 50 % IV SOLN: 0.0000 mL | INTRAVENOUS | Status: DC | PRN

## 2019-09-23 MED ORDER — INSULIN REGULAR(HUMAN) IN NACL 100-0.9 UT/100ML-% IV SOLN
INTRAVENOUS | Status: DC
  Administered 2019-09-23: 15 [IU]/h via INTRAVENOUS
  Filled 2019-09-23 (×2): qty 100

## 2019-09-23 MED ORDER — PROMETHAZINE HCL 25 MG/ML IJ SOLN
12.5000 mg | Freq: Four times a day (QID) | INTRAMUSCULAR | Status: DC | PRN
  Administered 2019-09-24: 12.5 mg via INTRAVENOUS
  Filled 2019-09-23: qty 1

## 2019-09-23 MED ORDER — POTASSIUM CHLORIDE 10 MEQ/100ML IV SOLN
10.0000 meq | INTRAVENOUS | Status: AC
  Administered 2019-09-23 (×2): 10 meq via INTRAVENOUS
  Filled 2019-09-23 (×2): qty 100

## 2019-09-23 MED ORDER — SODIUM CHLORIDE 0.9 % IV BOLUS
1000.0000 mL | INTRAVENOUS | Status: AC
  Administered 2019-09-23: 1000 mL via INTRAVENOUS

## 2019-09-23 MED ORDER — METOCLOPRAMIDE HCL 5 MG/ML IJ SOLN
10.0000 mg | Freq: Once | INTRAMUSCULAR | Status: AC
  Administered 2019-09-23: 10 mg via INTRAVENOUS
  Filled 2019-09-23: qty 2

## 2019-09-23 NOTE — H&P (Signed)
H&P       History and Physical    Samuel [name] ZOX:[id number] DOB: 12/01/1950 DOA: 09/23/2019  PCP: Pearson GrippeKim, James, MD  Patient coming from: Home, lives alone  I have personally briefly reviewed patient[facility]  Chief Complaint: Nausea, vomiting diarrhea  HPI: Samuel [name] is a 69 y.o. 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 Nausea and vomiting started last night and thinks it is due to his gastroparesis.  Vomitus was nonbloody and appears bilious.  He also started having diarrhea today and has had about 4-5 episodes.  He denies any abdominal pain.  His last insulin use was 2 days ago and states that he stopped because he did not feel well.  Reports he is normally on 80 units twice daily of Basaglar. He also notes some shortness of breath.  Patient has history of PE/DVT and stopped his Eliquis about a week ago on his own because he was having dental procedure coming up.  He denies any chest pain.  No lower extremity swelling.  He denies any tobacco, alcohol illicit drug use.  In the ED, he was afebrile, tachycardic up to 120s and stable on room air. Labs notable for BG of 481. pH of 7.375, Bicarb of 16.5, anion gap of 15.  He was given 2L IV NS bolus, 10mg  of Reglan and started on insulin infusion.    Review of Systems:  Constitutional: No Weight Change, No Fever ENT/Mouth: No sore throat, No Rhinorrhea Eyes: No Eye Pain, No Vision Changes Cardiovascular: No Chest Pain,+ SOB, + Dyspnea on Exertion, No Orthopnea, No Edema, No Palpitations Respiratory: + Cough, No Sputum Gastrointestinal: + Nausea, + Vomiting, + Diarrhea, No Constipation, No Pain Genitourinary: no Urinary Incontinence Musculoskeletal: No Arthralgias, No Myalgias Skin: No Skin Lesions, No Pruritus, Neuro: no  Weakness, No Numbness Psych: No Anxiety/Panic, No Depression, + decrease appetite Heme/Lymph: No Bruising, No Bleeding  Past Medical History:  Diagnosis Date  . Anxiety   . Arthritis    "all over"   . CAD (coronary artery disease)   . Charcot's joint    "left foot"  . 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 Depression   . Gastroparesis   . GERD (gastroesophageal reflux disease)   . H/O hiatal hernia   . Hyperlipidemia   . Hypertension   . Myocardial infarction (HCC) 2017/03/27   around this date  . OSA (obstructive sleep apnea)    "not bad enough for a mask"  . Peripheral neuropathy   . Peripheral vascular disease (HCC)   . PONV (postoperative nausea and vomiting)   . Pulmonary embolism (HCC)    hx. of 2012  . Shortness of breath    exertion  . Type II diabetes mellitus (HCC)     Past Surgical History:  Procedure Laterality Date  . AMPUTATION Left 03/08/2015   Procedure:  LEFT AMPUTATION BELOW KNEE;  Surgeon: Toni ArthursJohn Hewitt, MD;  Location: [facility];  Service: Orthopedics;  Laterality: Left;  . APPLICATION OF WOUND VAC Left 01/07/2014  . CHOLECYSTECTOMY  06/2010  . CORONARY ANGIOPLASTY WITH STENT PLACEMENT  05/2009   "1"  . FOOT SURGERY Left 2010   "for Charcot's joint"  . HARDWARE REMOVAL Left 01/07/2014   Procedure: LEFT LEG REMOVAL OF DEEP IMPLANT AND  SEQUESTRECTOMY; APPLICATION OF WOUND VAC ;  Surgeon: Toni Arthurs, MD;  Location: [facility] OR;  Service: Orthopedics;  Laterality: Left;  . I & D EXTREMITY Left "multiple"   leg  . 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 IM NAILING TIBIA Left ~ 2012  . LEFT HEART CATH AND CORONARY ANGIOGRAPHY N/A 04/08/2017   Procedure: LEFT HEART CATH AND CORONARY ANGIOGRAPHY;  Surgeon: Runell Gess, MD;  Location: [facility] INV[facility];  Service: Cardiovascular;  Laterality: N/A;    . SHOULDER ARTHROSCOPY W/ ROTATOR CUFF REPAIR Left    "and bone spurs"  . TIBIAL IM ROD REMOVAL Left 01/07/2014  . TOE AMPUTATION Right ~ 2011   "great toe"  . VENA CAVA FILTER PLACEMENT  2012  . VENA CAVA FILTER PLACEMENT N/A 11/20/2016   Procedure: INSERTION VENA-CAVA FILTER;  Surgeon: Sherren Kerns, MD;  Location: [facility] OR;  Service: Vascular;  Laterality: N/A;  . WOUND DEBRIDEMENT Left 01/07/2014   "tibia"     reports that he has never smoked. He has never used smokeless tobacco. He reports that he does not drink alcohol or use drugs.  Allergies  Allergen Reactions  . Gabapentin Hives, Itching, Nausea And Vomiting and Rash    REACTION: rash/hives (per patient, was a reaction to an inactive ingredient in another mgf brand). The patient stated that he does take this medication now and it doesn't cause a rash any more.  . Relafen [Nabumetone] Nausea And Vomiting and Rash  . Codeine Nausea And Vomiting and Rash    Family History  Problem Relation Age of Onset  . COPD Mother   . Heart disease Mother   . Lung cancer Mother   . Retinal detachment Father   . Alcoholism Brother   . Heart disease Brother   . COPD Brother   . Diabetes Brother   . Hypertension Brother   . Stroke Brother      Prior to Admission medications   Medication Sig Start Date End Date Taking? Authorizing Provider  amLODipine (NORVASC) 10 MG tablet Take 1 tablet (10 mg total) by mouth daily. 11/22/16   Richarda Overlie, MD  Apixaban Starter Pack (ELIQUIS STARTER PACK) 5 MG TBPK Take as directed on package: start with two-5mg  tablets twice daily for 7 days. On day 8, switch to one-5mg  tablet twice daily. 06/13/19   Briant Cedar, MD  aspirin 81 MG EC tablet Take 1 tablet (81 mg total) by mouth daily. 04/12/17   Glade Lloyd, MD  atorvastatin (LIPITOR) 80 MG tablet Take 1 tablet (80 mg total) by mouth daily at 6 PM. 04/11/17   Glade Lloyd, MD  clopidogrel (PLAVIX) 75 MG tablet Take 75 mg by mouth daily.     [provider]  docusate sodium (COLACE) 100 MG capsule Take 100 mg by mouth daily.    [provider]  ferrous sulfate 325 (65 FE) MG tablet Take 325 mg by mouth every evening.     [provider]  furosemide (LASIX) 20 MG tablet Take 1 tablet (20 mg total) by mouth daily. Patient taking differently: Take 20 mg by mouth daily as needed for fluid or edema.  04/12/17   Glade Lloyd, MD  gabapentin (NEURONTIN) 300 MG capsule Take 600 mg by mouth 2 (two) times daily.     [provider]  Insulin Glargine (  BASAGLAR KWIKPEN) 100 UNIT/ML SOPN Inject 40 Units into the skin daily.    Averneni, Larna Daughters, MD  insulin lispro (HUMALOG) 100 UNIT/ML injection Inject 5-18 Units into the skin See admin instructions. units three times daily before meals Sliding Scale 150-250 = 5 units 251-350= 6-8 units 351-450= 10 units Over 450 18 units    Averneni, Madhavi, MD  metFORMIN (GLUCOPHAGE-XR) 500 MG 24 hr tablet Take 500 mg by mouth daily.    [provider]  metoprolol tartrate (LOPRESSOR) 25 MG tablet Take 1 tablet (25 mg total) by mouth 2 (two) times daily. 04/11/17   Aline August, MD  nitroGLYCERIN (NITROSTAT) 0.4 MG SL tablet Place 1 tablet (0.4 mg total) under the tongue every 5 (five) minutes as needed for chest pain. 04/11/17   Aline August, MD  [facility] VERIO test strip Use to test blood sugar twice daily 09/24/17   [provider]  pantoprazole (PROTONIX) 40 MG tablet Take 1 tablet (40 mg total) by mouth 2 (two) times daily before a meal. 08/21/18   Mikhail, Velta Addison, DO  promethazine (PHENERGAN) 25 MG suppository Place 1 suppository (25 mg total) rectally every 6 (six) hours as needed for nausea or vomiting. 04/18/18   Long, Wonda Olds, MD  promethazine (PHENERGAN) 25 MG tablet Take 25-50 mg by mouth every 6 (six) hours as needed for nausea or vomiting.  05/05/18   [provider]  promethazine (PHENERGAN) 25 MG/ML injection Inject 25 mg into  the muscle every 6 (six) hours as needed for nausea/vomiting. 04/02/19   [provider]  ranolazine (RANEXA) 500 MG 12 hr tablet Take 1 tablet (500 mg total) by mouth 2 (two) times daily. 04/11/17   Aline August, MD  traMADol (ULTRAM) 50 MG tablet Take 100 mg by mouth every 6 (six) hours as needed for moderate pain.     [provider]    Physical Exam: Vitals:   09/23/19 1900 09/23/19 1930 09/23/19 2000 09/23/19 2030  BP: (!) 154/91 (!) 152/88 (!) 162/90 (!) 160/90  Pulse: (!) 114 (!) 113 (!) 122 (!) 125  Resp: 16 18 (!) 22 (!) 21  Temp:      TempSrc:      SpO2: 96% 95% 97% 98%    Constitutional: NAD, calm, comfortable, chronically ill-appearing male with generalized pallor sitting upright in bed Vitals:   09/23/19 1900 09/23/19 1930 09/23/19 2000 09/23/19 2030  BP: (!) 154/91 (!) 152/88 (!) 162/90 (!) 160/90  Pulse: (!) 114 (!) 113 (!) 122 (!) 125  Resp: 16 18 (!) 22 (!) 21  Temp:      TempSrc:      SpO2: 96% 95% 97% 98%   Eyes: PERRL, lids and conjunctivae normal ENMT: Mucous membranes are dry.   Neck: normal, supple Respiratory: clear to auscultation bilaterally, no wheezing, no crackles.  Mildly tachypneic with conversation.  No accessory muscle use.  Cardiovascular: Sinus tachycardia on telemetry, no murmurs / rubs / gallops. No extremity edema. 2+ pedal pulses.  Abdomen: no tenderness, no masses palpated. Bowel sounds positive.  Nauseous during exam. Musculoskeletal: no clubbing / cyanosis.  S/p left BKA. Skin: Erythematous right pretibial region with healing ulcers.  No increased warmth with palpation. Neurologic: CN 2-12 grossly intact. Sensation intact, Strength 4/5 in lower extremity. Psychiatric: Normal judgment and insight. Alert and oriented x 3. Normal mood.     Labs on Admission: I have personally reviewed following labs and imaging studies  CBC: Recent Labs  Lab 09/23/19 1800  WBC 14.3*  NEUTROABS 13.1*  HGB 15.3  HCT 47.4  MCV  92.6  PLT 220   Basic Metabolic Panel: Recent Labs  Lab 09/23/19 1800  NA 139  K 4.7  CL 97*  CO2 17*  GLUCOSE 481*  BUN 20  CREATININE 1.16  CALCIUM 9.6   GFR: CrCl cannot be calculated (Unknown ideal weight.). Liver Function Tests: Recent Labs  Lab 09/23/19 1800  AST 18  ALT 14  ALKPHOS 69  BILITOT 2.0*  PROT 7.9  ALBUMIN 3.9   Recent Labs  Lab 09/23/19 1800  LIPASE 14   No results for input(s): AMMONIA in the last 168 hours. Coagulation Profile: No results for input(s): INR, PROTIME in the last 168 hours. Cardiac Enzymes: No results for input(s): CKTOTAL, CKMB, CKMBINDEX, TROPONINI in the last 168 hours. BNP (last 3 results) No results for input(s): PROBNP in the last 8760 hours. HbA1C: No results for input(s): HGBA1C in the last 72 hours. CBG: Recent Labs  Lab 09/23/19 1802 09/23/19 1949 09/23/19 2031  GLUCAP 462* 433* 421*   Lipid Profile: No results for input(s): CHOL, HDL, LDLCALC, TRIG, CHOLHDL, LDLDIRECT in the last 72 hours. Thyroid Function Tests: No results for input(s): TSH, T4TOTAL, FREET4, T3FREE, THYROIDAB in the last 72 hours. Anemia Panel: No results for input(s): VITAMINB12, FOLATE, FERRITIN, TIBC, IRON, RETICCTPCT in the last 72 hours. Urine analysis:    Component Value Date/Time   COLORURINE YELLOW 09/23/2019 1811   APPEARANCEUR CLEAR 09/23/2019 1811   LABSPEC 1.025 09/23/2019 1811   PHURINE 6.0 09/23/2019 1811   GLUCOSEU >=500 (A) 09/23/2019 1811   HGBUR NEGATIVE 09/23/2019 1811   BILIRUBINUR NEGATIVE 09/23/2019 1811   KETONESUR 80 (A) 09/23/2019 1811   PROTEINUR 100 (A) 09/23/2019 1811   UROBILINOGEN 1.0 04/05/2015 1155   NITRITE NEGATIVE 09/23/2019 1811   LEUKOCYTESUR NEGATIVE 09/23/2019 1811    Radiological Exams on Admission: DG Chest Portable 1 View  Result Date: 09/23/2019 CLINICAL DATA:  69 year old male with diabetic ketoacidosis. Nausea vomiting and diarrhea. EXAM: PORTABLE CHEST 1 VIEW COMPARISON:  Chest  radiograph dated 11/12/2018. FINDINGS: The lungs are clear. There is no pleural effusion or pneumothorax. The cardiac silhouette is within limits. No acute osseous pathology. IMPRESSION: No active disease. Electronically Signed   By: Elgie Collard M.D.   On: 09/23/2019 20:36    EKG: Independently reviewed.   Assessment/Plan  Hyperglycemia in the setting of uncontrolled type 2 diabetes  Patient stopped insulin use following likely gastroparesis flareup Admit to stepdown unit - replete potassium - continue insulin gtt with goal of 140-180 and AG <12 - IV NS until BG <250, then switch to D5 1/2 NS  - BMP q4hr  - keep NPO  - PRN Antiemetics  History of PE/DVT Patient stopped Eliquis use for the past week on his own due to upcoming dental procedure. Resume Eliquis. He is significantly tachycardic but is likely due to his hyperglycemia.  However he continues to be tachycardic following resolution of his anion gap, will consider CTA angio to assess for any recurrent PE  CAD s/p stent Stable. Asymptomatic  Hypertension Continue anti-hypertensive  COPD Not in any acute exacerbation  OSA  Not on CPAP  Left LE wound not signs of infection wound care per RN  s/p Left BKA pt is wheelchair bound  DVT prophylaxis: Eliquis Code Status: DNR Family Communication: Plan discussed with patient at bedside  disposition Plan: Home with at least 2 midnight stays  Consults called:  Admission status: inpatient with admission to stepdown  unit due to hyperglycemia requiring insulin infusion.  Medication reconciliation was incomplete at the time of admission and will need to be followed up.   Anselm Jungling DO [facility]   If 7PM-7AM, please contact night-coverage [email or web address]   09/23/2019, 8:57 PM

## 2019-09-23 NOTE — ED Triage Notes (Signed)
Pt BIB EMS from home. Pt reports n/v/d that started yesterday. Pt has been seen in the past for same.  20G LH Zofran 4mg   NS CBG 347 HR 115 BP 180/90 99% RA

## 2019-09-23 NOTE — ED Provider Notes (Signed)
[facility] DEPT Provider Note   CSN: [id number] Arrival date & time: 09/23/19  1719     History Chief Complaint  Patient presents with  . Weakness  . Emesis  . Diarrhea    Samuel [name] is a 69 y.o. male with history of coronary artery disease, gastroparesis, hypertension, hyperlipidemia, type 2 diabetes mellitus, PE presents for evaluation of acute onset, progressively worsening nausea and vomiting beginning yesterday evening.  He states that he cannot remember what he had to eat yesterday.  States he has not been able to tolerate any p.o. intake today.  He has had multiple episodes of nonbloody light brown emesis.  Denies coffee-ground emesis.  He denies abdominal pain.  He has had a few episodes of watery nonbloody diarrhea beginning today.  He denies fevers, chills, chest pain, shortness of breath, or urinary symptoms.  He has been checking his blood sugars and states that they have been running in the 400s.  He has tried liquid Phenergan without relief of symptoms.  He states he feels as though his gastroparesis is flaring up.  He is requesting supplemental oxygen "because I think it will make me feel better".  The history is provided by the patient.       Past Medical History:  Diagnosis Date  . Anxiety   . Arthritis    "all over"   . CAD (coronary artery disease)   . Charcot's joint    "left foot"  . 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 Depression   . Gastroparesis   . GERD (gastroesophageal reflux disease)   . H/O hiatal hernia   . Hyperlipidemia   . Hypertension   . Myocardial infarction ([place]) 2017/03/27   around this date  . OSA (obstructive sleep apnea)    "not bad enough for a mask"  . Peripheral neuropathy   . Peripheral vascular disease ([place])   . PONV (postoperative nausea and vomiting)   . Pulmonary embolism ([place])    hx. of 2012  . Shortness of breath    exertion  . Type II diabetes mellitus [facility])     Patient Active Problem List   Diagnosis Date Noted  . Hyperglycemia 09/23/2019  . DVT (deep venous thrombosis) ([place]) 06/09/2019  . DKA (diabetic ketoacidoses) ([place]) 10/01/2018  . Gastroparesis 08/17/2018  . Chest pain 08/16/2018  . DKA, type 2 ([place]) 02/03/2018  . DOE (dyspnea on exertion) 01/24/2018  . Uncontrolled diabetes mellitus ([place]) 09/23/2017  . DKA, type 2, not at goal [facility]) 09/22/2017  . S/P BKA (below knee amputation) ([place]) 04/19/2017  . COPD (chronic obstructive pulmonary disease) ([place]) 04/19/2017  . NSTEMI (non-ST elevated myocardial infarction) ([place]) 04/07/2017  . Osteomyelitis of foot ([place])   . Protein-calorie malnutrition, severe ([place]) 03/08/2015  . Osteomyelitis of foot, acute ([place]) 03/07/2015  . Diabetes mellitus type 2, uncontrolled ([place]) 03/07/2015  . Normocytic normochromic anemia 03/07/2015  . Obesity (BMI 30-39.9) 03/07/2015  . Nausea & vomiting   . DM (diabetes mellitus), type 2 ([place]) 01/07/2014  . Chronic osteomyelitis involving lower leg ([place]) 01/07/2014  . Vomiting 12/22/2013  . Diabetic gastroparesis ([place]) 12/21/2013  . Intractable nausea and vomiting 12/21/2013  . Essential hypertension 05/01/2013  . Hyperlipidemia due to type 2 diabetes mellitus ([place]) 05/01/2013  . Venous stasis of lower extremity 04/15/2013  . Generalized weakness 03/20/2013  . Back pain 03/20/2013  . Pulmonary embolism ([place]) 03/27/2011  . PERFORATION OF GALLBLADDER 08/30/2010  . Ulcer of lower  limb, unspecified 03/01/2010  . METHICILLIN SUSCEPTIBLE STAPH AUREUS SEPTICEMIA 01/25/2010  . Acute osteomyelitis, ankle and foot 01/25/2010  . NAUSEA AND VOMITING 01/25/2010  . GERD 11/09/2009  . CAD S/P DES PCI to mLAD: Xience DES 2.75 x 15 (3.0 mm) 05/01/2009  . DIABETES, TYPE 1 05/14/2008  . OBSTRUCTIVE SLEEP APNEA 04/30/2008  . ALLERGIC RHINITIS, SEASONAL 04/30/2008    Past Surgical History:  Procedure Laterality Date  . AMPUTATION Left 03/08/2015   Procedure:  LEFT AMPUTATION  BELOW KNEE;  Surgeon: Toni Arthurs, MD;  Location: [facility] ORS;  Service: Orthopedics;  Laterality: Left;  . APPLICATION OF WOUND VAC Left 01/07/2014  . CHOLECYSTECTOMY  06/2010  . CORONARY ANGIOPLASTY WITH STENT PLACEMENT  05/2009   "1"  . FOOT SURGERY Left 2010   "for Charcot's joint"  . HARDWARE REMOVAL Left 01/07/2014   Procedure: LEFT LEG REMOVAL OF DEEP IMPLANT AND SEQUESTRECTOMY; APPLICATION OF WOUND VAC ;  Surgeon: Toni Arthurs, MD;  Location: [facility] OR;  Service: Orthopedics;  Laterality: Left;  . I & D EXTREMITY Left "multiple"   leg  . 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 IM NAILING TIBIA Left ~ 2012  . LEFT HEART CATH AND CORONARY ANGIOGRAPHY N/A 04/08/2017   Procedure: LEFT HEART CATH AND CORONARY ANGIOGRAPHY;  Surgeon: Runell Gess, MD;  Location: [facility] INVASIVE CV LAB;  Service: Cardiovascular;  Laterality: N/A;  . SHOULDER ARTHROSCOPY W/ ROTATOR CUFF REPAIR Left    "and bone spurs"  . TIBIAL IM ROD REMOVAL Left 01/07/2014  . TOE AMPUTATION Right ~ 2011   "great toe"  . VENA CAVA FILTER PLACEMENT  2012  . VENA CAVA FILTER PLACEMENT N/A 11/20/2016   Procedure: INSERTION VENA-CAVA FILTER;  Surgeon: Sherren Kerns, MD;  Location: [facility] OR;  Service: Vascular;  Laterality: N/A;  . WOUND DEBRIDEMENT Left 01/07/2014   "tibia"       Family History  Problem Relation Age of Onset  . COPD Mother   . Heart disease Mother   . Lung cancer Mother   . Retinal detachment Father   . Alcoholism Brother   . Heart disease Brother   . COPD Brother   . Diabetes Brother   . Hypertension Brother   . Stroke Brother     Social History   Tobacco Use  . Smoking status: Never Smoker  . Smokeless tobacco: Never Used  Substance Use Topics  . Alcohol use: No    Comment: 01/07/2014 'might have a wine cooler a couple times/yr"  . Drug use: No    Home  Medications Prior to Admission medications   Medication Sig Start Date End Date Taking? Authorizing Provider  amLODipine (NORVASC) 10 MG tablet Take 1 tablet (10 mg total) by mouth daily. 11/22/16   Richarda Overlie, MD  Apixaban Starter Pack (ELIQUIS STARTER PACK) 5 MG TBPK Take as directed on package: start with two-5mg  tablets twice daily for 7 days. On day 8, switch to one-5mg  tablet twice daily. 06/13/19   Briant Cedar, MD  aspirin 81 MG EC tablet Take 1 tablet (81 mg total) by mouth daily. 04/12/17   Glade Lloyd, MD  atorvastatin (LIPITOR) 80 MG tablet Take 1 tablet (80 mg total) by mouth daily at 6 PM. 04/11/17   Glade Lloyd, MD  clopidogrel (PLAVIX) 75 MG tablet Take 75 mg by mouth daily.  [provider]  docusate sodium (COLACE) 100 MG capsule Take 100 mg by mouth daily.    [provider]  ferrous sulfate 325 (65 FE) MG tablet Take 325 mg by mouth every evening.     [provider]  furosemide (LASIX) 20 MG tablet Take 1 tablet (20 mg total) by mouth daily. Patient taking differently: Take 20 mg by mouth daily as needed for fluid or edema.  04/12/17   Glade Lloyd, MD  gabapentin (NEURONTIN) 300 MG capsule Take 600 mg by mouth 2 (two) times daily.     [provider]  Insulin Glargine (BASAGLAR KWIKPEN) 100 UNIT/ML SOPN Inject 40 Units into the skin daily.    Averneni, Carmon Sails, MD  insulin lispro (HUMALOG) 100 UNIT/ML injection Inject 5-18 Units into the skin See admin instructions. units three times daily before meals Sliding Scale 150-250 = 5 units 251-350= 6-8 units 351-450= 10 units Over 450 18 units    Averneni, Madhavi, MD  metFORMIN (GLUCOPHAGE-XR) 500 MG 24 hr tablet Take 500 mg by mouth daily.    [provider]  metoprolol tartrate (LOPRESSOR) 25 MG tablet Take 1 tablet (25 mg total) by mouth 2 (two) times daily. 04/11/17   Glade Lloyd, MD  nitroGLYCERIN (NITROSTAT) 0.4 MG SL tablet Place 1 tablet (0.4 mg total)  under the tongue every 5 (five) minutes as needed for chest pain. 04/11/17   Glade Lloyd, MD  [facility] VERIO test strip Use to test blood sugar twice daily 09/24/17   [provider]  pantoprazole (PROTONIX) 40 MG tablet Take 1 tablet (40 mg total) by mouth 2 (two) times daily before a meal. 08/21/18   Mikhail, Nita Sells, DO  promethazine (PHENERGAN) 25 MG suppository Place 1 suppository (25 mg total) rectally every 6 (six) hours as needed for nausea or vomiting. 04/18/18   Long, Arlyss Repress, MD  promethazine (PHENERGAN) 25 MG tablet Take 25-50 mg by mouth every 6 (six) hours as needed for nausea or vomiting.  05/05/18   [provider]  promethazine (PHENERGAN) 25 MG/ML injection Inject 25 mg into the muscle every 6 (six) hours as needed for nausea/vomiting. 04/02/19   [provider]  ranolazine (RANEXA) 500 MG 12 hr tablet Take 1 tablet (500 mg total) by mouth 2 (two) times daily. 04/11/17   Glade Lloyd, MD  traMADol (ULTRAM) 50 MG tablet Take 100 mg by mouth every 6 (six) hours as needed for moderate pain.     [provider]    Allergies    Gabapentin, Relafen [nabumetone], and Codeine  Review of Systems   Review of Systems  Constitutional: Negative for chills and fever.  Respiratory: Negative for shortness of breath.   Cardiovascular: Negative for chest pain.  Gastrointestinal: Positive for diarrhea, nausea and vomiting. Negative for abdominal pain.  All other systems reviewed and are negative.   Physical Exam Updated Vital Signs BP 106/60   Pulse (!) 116   Temp 98 F (36.7 C) (Oral)   Resp 18   SpO2 95%   Physical Exam Vitals and nursing note reviewed.  Constitutional:      General: He is not in acute distress.    Appearance: He is well-developed. He is obese.     Comments: Chronically ill in appearance  HENT:     Head: Normocephalic and atraumatic.  Eyes:     General:        Right eye: No discharge.        Left eye: No discharge.  Conjunctiva/sclera: Conjunctivae normal.  Neck:     Vascular: No JVD.     Trachea: No tracheal deviation.  Cardiovascular:     Rate and Rhythm: Regular rhythm. Tachycardia present.  Pulmonary:     Breath sounds: Normal breath sounds.     Comments: SPO2 saturations 96% on room air.  He is tachypneic, speaking full sentences Abdominal:     General: Bowel sounds are normal. There is no distension.     Palpations: Abdomen is soft.     Tenderness: There is abdominal tenderness. There is no guarding.     Comments: Mild soreness on palpation of the left upper quadrant  Skin:    General: Skin is warm and dry.     Findings: No erythema.  Neurological:     Mental Status: He is alert.  Psychiatric:        Behavior: Behavior normal.     ED Results / Procedures / Treatments   Labs (all labs ordered are listed, but only abnormal results are displayed) Labs Reviewed  CBC WITH DIFFERENTIAL/PLATELET - Abnormal; Notable for the following components:      Result Value   WBC 14.3 (*)    Neutro Abs 13.1 (*)    All other components within normal limits  COMPREHENSIVE METABOLIC PANEL - Abnormal; Notable for the following components:   Chloride 97 (*)    CO2 17 (*)    Glucose, Bld 481 (*)    Total Bilirubin 2.0 (*)    Anion gap 25 (*)    All other components within normal limits  URINALYSIS, ROUTINE W REFLEX MICROSCOPIC - Abnormal; Notable for the following components:   Glucose, UA >=500 (*)    Ketones, ur 80 (*)    Protein, ur 100 (*)    All other components within normal limits  BLOOD GAS, VENOUS - Abnormal; Notable for the following components:   pCO2, Ven 30.1 (*)    pO2, Ven 63.8 (*)    Bicarbonate 16.5 (*)    Acid-base deficit 7.3 (*)    All other components within normal limits  BETA-HYDROXYBUTYRIC ACID - Abnormal; Notable for the following components:   Beta-Hydroxybutyric Acid 7.74 (*)    All other components within normal limits  BASIC METABOLIC PANEL - Abnormal; Notable for  the following components:   CO2 19 (*)    Glucose, Bld 352 (*)    Creatinine, Ser 1.33 (*)    Calcium 8.8 (*)    GFR calc non Af Amer 55 (*)    Anion gap 17 (*)    All other components within normal limits  CBG MONITORING, ED - Abnormal; Notable for the following components:   Glucose-Capillary 462 (*)    All other components within normal limits  CBG MONITORING, ED - Abnormal; Notable for the following components:   Glucose-Capillary 433 (*)    All other components within normal limits  CBG MONITORING, ED - Abnormal; Notable for the following components:   Glucose-Capillary 421 (*)    All other components within normal limits  CBG MONITORING, ED - Abnormal; Notable for the following components:   Glucose-Capillary 391 (*)    All other components within normal limits  CBG MONITORING, ED - Abnormal; Notable for the following components:   Glucose-Capillary 350 (*)    All other components within normal limits  CBG MONITORING, ED - Abnormal; Notable for the following components:   Glucose-Capillary 292 (*)    All other components within normal limits  SARS CORONAVIRUS 2 (TAT  6-24 HRS)  LIPASE, BLOOD  BETA-HYDROXYBUTYRIC ACID  BASIC METABOLIC PANEL  BASIC METABOLIC PANEL  BASIC METABOLIC PANEL  CBC  BETA-HYDROXYBUTYRIC ACID    EKG EKG Interpretation  Date/Time:  Wednesday September 23 2019 18:00:34 EDT Ventricular Rate:  116 PR Interval:    QRS Duration: 100 QT Interval:  350 QTC Calculation: 487 R Axis:   43 Text Interpretation: Sinus tachycardia Inferior infarct, old No STEMI Confirmed by Alvester Chourifan, Matthew [phone]([id number]) on 09/23/2019 6:16:13 PM   Radiology DG Chest Portable 1 View  Result Date: 09/23/2019 CLINICAL DATA:  69 year old male with diabetic ketoacidosis. Nausea vomiting and diarrhea. EXAM: PORTABLE CHEST 1 VIEW COMPARISON:  Chest radiograph dated 11/12/2018. FINDINGS: The lungs are clear. There is no pleural effusion or pneumothorax. The cardiac silhouette is within  limits. No acute osseous pathology. IMPRESSION: No active disease. Electronically Signed   By: Elgie CollardArash  Radparvar M.D.   On: 09/23/2019 20:36    Procedures .Critical Care Performed by: Jeanie SewerFawze, Bradyn Vassey A, PA-C Authorized by: Jeanie SewerFawze, Teela Narducci A, PA-C   Critical care provider statement:    Critical care time (minutes):  45   Critical care was necessary to treat or prevent imminent or life-threatening deterioration of the following conditions:  Metabolic crisis   Critical care was time spent personally by me on the following activities:  Discussions with consultants, evaluation of patient's response to treatment, examination of patient, ordering and performing treatments and interventions, ordering and review of laboratory studies, ordering and review of radiographic studies, pulse oximetry, re-evaluation of patient's condition, obtaining history from patient or surrogate and review of old charts   (including critical care time)  Medications Ordered in ED Medications  insulin regular, human (MYXREDLIN) 100 units/ 100 mL infusion (18 Units/hr Intravenous Rate/Dose Change 09/23/19 2315)  0.9 %  sodium chloride infusion ( Intravenous New Bag/Given (Non-Interop) 09/23/19 2006)  dextrose 5 %-0.45 % sodium chloride infusion ( Intravenous Hold 09/23/19 1939)  dextrose 50 % solution 0-50 mL (has no administration in time range)  promethazine (PHENERGAN) injection 12.5 mg (has no administration in time range)  sodium chloride 0.9 % bolus 1,000 mL (0 mLs Intravenous Stopped 09/23/19 2006)  metoCLOPramide (REGLAN) injection 10 mg (10 mg Intravenous Given 09/23/19 1900)  sodium chloride 0.9 % bolus 1,000 mL (0 mLs Intravenous Stopped 09/23/19 2118)  potassium chloride 10 mEq in 100 mL IVPB (0 mEq Intravenous Stopped 09/23/19 2219)  famotidine (PEPCID) IVPB 20 mg premix (0 mg Intravenous Stopped 09/23/19 2219)    ED Course  I have reviewed the triage vital signs and the nursing notes.  Pertinent labs & imaging results that were  available during my care of the patient were reviewed by me and considered in my medical decision making (see chart for details).    MDM Rules/Calculators/A&P                      Patient presenting for evaluation of nausea, vomiting, diarrhea beginning yesterday evening.  He presents to the ED tachycardic, intermittently hypertensive.  He appears ill.  Mild left upper quadrant soreness on palpation of the abdomen.  Lab work reviewed and interpreted by myself shows findings consistent with DKA with hyperglycemia, elevated anion gap of 25, CO2 17 but VBG shows blood pH within normal limits.  He was given IV fluids, started on an insulin drip and given potassium.  He was given IV Reglan, on reevaluation reports that his nausea has improved but he still feels unwell.  Will require admission for  DKA.  Spoke with Dr. Cyndia Bent with [facility] hospitalist service who agrees to assume care of patient and bring him into the hospital for further evaluation and management.   Final Clinical Impression(s) / ED Diagnoses Final diagnoses:  Diabetic ketoacidosis without coma associated with type 2 diabetes mellitus [facility]    Rx / DC Orders ED Discharge Orders    None       Jeanie Sewer, PA-C 09/23/19 2340    Terald Sleeper, MD 09/24/19 [phone]

## 2019-09-24 DIAGNOSIS — E1110 Type 2 diabetes mellitus with ketoacidosis without coma: Principal | ICD-10-CM

## 2019-09-24 LAB — BASIC METABOLIC PANEL
Anion gap: 9 (ref 5–15)
Anion gap: 9 (ref 5–15)
Anion gap: 9 (ref 5–15)
BUN: 21 mg/dL (ref 8–23)
BUN: 24 mg/dL — ABNORMAL HIGH (ref 8–23)
BUN: 23 mg/dL (ref 8–23)
CO2: 22 mmol/L (ref 22–32)
CO2: 23 mmol/L (ref 22–32)
CO2: 22 mmol/L (ref 22–32)
Calcium: 7.9 mg/dL — ABNORMAL LOW (ref 8.9–10.3)
Calcium: 8.5 mg/dL — ABNORMAL LOW (ref 8.9–10.3)
Calcium: 8.4 mg/dL — ABNORMAL LOW (ref 8.9–10.3)
Chloride: 106 mmol/L (ref 98–111)
Chloride: 108 mmol/L (ref 98–111)
Chloride: 105 mmol/L (ref 98–111)
Creatinine, Ser: 0.93 mg/dL (ref 0.61–1.24)
Creatinine, Ser: 0.97 mg/dL (ref 0.61–1.24)
Creatinine, Ser: 0.94 mg/dL (ref 0.61–1.24)
GFR calc Af Amer: >60 mL/min (ref >60)
GFR calc Af Amer: >60 mL/min (ref >60)
GFR calc Af Amer: >60 mL/min (ref >60)
GFR calc non Af Amer: >60 mL/min (ref >60)
GFR calc non Af Amer: >60 mL/min (ref >60)
GFR calc non Af Amer: >60 mL/min (ref >60)
Glucose, Bld: 168 mg/dL — ABNORMAL HIGH (ref 70–99)
Glucose, Bld: 132 mg/dL — ABNORMAL HIGH (ref 70–99)
Glucose, Bld: 162 mg/dL — ABNORMAL HIGH (ref 70–99)
Potassium: 4.2 mmol/L (ref 3.5–5.1)
Potassium: 4.4 mmol/L (ref 3.5–5.1)
Potassium: 3.3 mmol/L — ABNORMAL LOW (ref 3.5–5.1)
Sodium: 137 mmol/L (ref 135–145)
Sodium: 140 mmol/L (ref 135–145)
Sodium: 136 mmol/L (ref 135–145)

## 2019-09-24 LAB — CBC
HCT: 37.6 % — ABNORMAL LOW (ref 39.0–52.0)
Hemoglobin: 12.5 g/dL — ABNORMAL LOW (ref 13.0–17.0)
MCH: 30.3 pg (ref 26.0–34.0)
MCHC: 33.2 g/dL (ref 30.0–36.0)
MCV: 91 fL (ref 80.0–100.0)
Platelets: 221 10*3/uL (ref 150–400)
RBC: 4.13 MIL/uL — ABNORMAL LOW (ref 4.22–5.81)
RDW: 12.9 % (ref 11.5–15.5)
WBC: 13.8 10*3/uL — ABNORMAL HIGH (ref 4.0–10.5)
nRBC: 0 % (ref 0.0–0.2)

## 2019-09-24 LAB — CBG MONITORING, ED
Glucose-Capillary: 112 mg/dL — ABNORMAL HIGH (ref 70–99)
Glucose-Capillary: 133 mg/dL — ABNORMAL HIGH (ref 70–99)
Glucose-Capillary: 140 mg/dL — ABNORMAL HIGH (ref 70–99)
Glucose-Capillary: 140 mg/dL — ABNORMAL HIGH (ref 70–99)
Glucose-Capillary: 149 mg/dL — ABNORMAL HIGH (ref 70–99)
Glucose-Capillary: 152 mg/dL — ABNORMAL HIGH (ref 70–99)
Glucose-Capillary: 165 mg/dL — ABNORMAL HIGH (ref 70–99)
Glucose-Capillary: 166 mg/dL — ABNORMAL HIGH (ref 70–99)
Glucose-Capillary: 166 mg/dL — ABNORMAL HIGH (ref 70–99)
Glucose-Capillary: 185 mg/dL — ABNORMAL HIGH (ref 70–99)
Glucose-Capillary: 215 mg/dL — ABNORMAL HIGH (ref 70–99)
Glucose-Capillary: 252 mg/dL — ABNORMAL HIGH (ref 70–99)
Glucose-Capillary: 264 mg/dL — ABNORMAL HIGH (ref 70–99)
Glucose-Capillary: 94 mg/dL (ref 70–99)

## 2019-09-24 LAB — GLUCOSE, CAPILLARY
Glucose-Capillary: 156 mg/dL — ABNORMAL HIGH (ref 70–99)
Glucose-Capillary: 166 mg/dL — ABNORMAL HIGH (ref 70–99)

## 2019-09-24 LAB — SARS CORONAVIRUS 2 (TAT 6-24 HRS): SARS Coronavirus 2: NEGATIVE

## 2019-09-24 LAB — BETA-HYDROXYBUTYRIC ACID
Beta-Hydroxybutyric Acid: 0.45 mmol/L — ABNORMAL HIGH (ref 0.05–0.27)
Beta-Hydroxybutyric Acid: 1.49 mmol/L — ABNORMAL HIGH (ref 0.05–0.27)

## 2019-09-24 MED ORDER — ALUM & MAG HYDROXIDE-SIMETH 200-200-20 MG/5ML PO SUSP
15.0000 mL | Freq: Four times a day (QID) | ORAL | Status: DC | PRN
  Administered 2019-09-24: 15 mL via ORAL
  Filled 2019-09-24 (×2): qty 30

## 2019-09-24 MED ORDER — ATORVASTATIN CALCIUM 40 MG PO TABS
80.0000 mg | ORAL_TABLET | Freq: Every day | ORAL | Status: DC
  Administered 2019-09-24: 80 mg via ORAL
  Filled 2019-09-24 (×2): qty 2

## 2019-09-24 MED ORDER — RANOLAZINE ER 500 MG PO TB12
500.0000 mg | ORAL_TABLET | Freq: Two times a day (BID) | ORAL | Status: DC
  Administered 2019-09-24 – 2019-09-25 (×2): 500 mg via ORAL
  Filled 2019-09-24 (×3): qty 1

## 2019-09-24 MED ORDER — PROCHLORPERAZINE EDISYLATE 10 MG/2ML IJ SOLN
10.0000 mg | Freq: Four times a day (QID) | INTRAMUSCULAR | Status: DC | PRN
  Administered 2019-09-24: 10 mg via INTRAVENOUS
  Filled 2019-09-24: qty 2

## 2019-09-24 MED ORDER — METOPROLOL TARTRATE 25 MG PO TABS
25.0000 mg | ORAL_TABLET | Freq: Two times a day (BID) | ORAL | Status: DC
  Administered 2019-09-24 – 2019-09-25 (×2): 25 mg via ORAL
  Filled 2019-09-24 (×2): qty 1

## 2019-09-24 MED ORDER — INSULIN ASPART 100 UNIT/ML ~~LOC~~ SOLN
0.0000 [IU] | Freq: Three times a day (TID) | SUBCUTANEOUS | Status: DC
  Administered 2019-09-24 – 2019-09-25 (×3): 3 [IU] via SUBCUTANEOUS
  Filled 2019-09-24: qty 0.15

## 2019-09-24 MED ORDER — INSULIN ASPART 100 UNIT/ML ~~LOC~~ SOLN
0.0000 [IU] | Freq: Every day | SUBCUTANEOUS | Status: DC
  Filled 2019-09-24: qty 0.05

## 2019-09-24 MED ORDER — POTASSIUM CHLORIDE CRYS ER 20 MEQ PO TBCR
40.0000 meq | EXTENDED_RELEASE_TABLET | Freq: Two times a day (BID) | ORAL | Status: AC
  Administered 2019-09-24 (×2): 40 meq via ORAL
  Filled 2019-09-24 (×2): qty 2

## 2019-09-24 MED ORDER — AMLODIPINE BESYLATE 5 MG PO TABS
10.0000 mg | ORAL_TABLET | Freq: Every day | ORAL | Status: DC
  Administered 2019-09-24 – 2019-09-25 (×2): 10 mg via ORAL
  Filled 2019-09-24 (×3): qty 2

## 2019-09-24 MED ORDER — ASPIRIN EC 81 MG PO TBEC
81.0000 mg | DELAYED_RELEASE_TABLET | Freq: Every day | ORAL | Status: DC
  Administered 2019-09-24 – 2019-09-25 (×2): 81 mg via ORAL
  Filled 2019-09-24 (×3): qty 1

## 2019-09-24 MED ORDER — NITROGLYCERIN 0.4 MG SL SUBL: 0.4000 mg | SUBLINGUAL_TABLET | SUBLINGUAL | Status: DC | PRN

## 2019-09-24 MED ORDER — INSULIN GLARGINE 100 UNIT/ML ~~LOC~~ SOLN
55.0000 [IU] | Freq: Two times a day (BID) | SUBCUTANEOUS | Status: DC
  Administered 2019-09-24 – 2019-09-25 (×3): 55 [IU] via SUBCUTANEOUS
  Filled 2019-09-24 (×4): qty 0.55

## 2019-09-24 NOTE — ED Notes (Signed)
Attempted to call report for pt transfer to the floor x2, nurse unavailable will try again in a few minutes

## 2019-09-24 NOTE — Progress Notes (Signed)
Inpatient Diabetes Program Recommendations  AACE/ADA: New Consensus Statement on Inpatient Glycemic Control (2015)  Target Ranges:  Prepandial:   less than 140 mg/dL      Peak postprandial:   less than 180 mg/dL (1-2 hours)      Critically ill patients:  140 - 180 mg/dL   Lab Results  Component Value Date   GLUCAP 140 (H) 09/24/2019   HGBA1C 12.1 (H) 06/10/2019    Review of Glycemic Control  Diabetes history: DM2 Outpatient Diabetes medications: Basaglar 80 units bid, Humalog 5-18 units tidwc, metformin 500 mg QD Current orders for Inpatient glycemic control: IV insulin per [employer] for DKA.  Now AG 9, CO2 22  Transitioned to Lantus 55 units bid, Novolog 0-15 units tidwc and hs   HgbA1C - 12.1% - Poor glycemic control - Had stopped taking his insulin 2 days ago, as he didn't feel well.  CHO mod diet ordered at 1225, although pt has not received tray.  Inpatient Diabetes Program Recommendations:     If post-prandials over goal of < 180 mg/dL, add Novolog 4 units tidwc, if pt eating > 50% meal.  Will speak with pt in am.   Thank you. Ailene Ards, RD, LDN, CDE Inpatient Diabetes Coordinator [phone]

## 2019-09-24 NOTE — ED Notes (Signed)
Pt. CBG 264, RN, Lurena Joiner made aware.

## 2019-09-24 NOTE — ED Notes (Signed)
Insulin drip d/cd and correction scaled novolog given of 2 units

## 2019-09-24 NOTE — Progress Notes (Signed)
PROGRESS NOTE    Samuel [name]  FWY:[id number] DOB: [birth year] DOA: 09/23/2019 PCP: [name], MD   Brief Narrative:  HPI per Dr. Benita Gutter on 09/23/2019 Samuel [name] is a 69 y.o. 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 Nausea and vomiting started last night and thinks it is due to his gastroparesis.  Vomitus was nonbloody and appears bilious.  He also started having diarrhea today and has had about 4-5 episodes.  He denies any abdominal pain.  His last insulin use was 2 days ago and states that he stopped because he did not feel well.  Reports he is normally on 80 units twice daily of Basaglar. He also notes some shortness of breath.  Patient has history of PE/DVT and stopped his Eliquis about a week ago on his own because he was having dental procedure coming up.  He denies any chest pain.  No lower extremity swelling.  He denies any tobacco, alcohol illicit drug use.  In the ED, he was afebrile, tachycardic up to 120s and stable on room air. Labs notable for BG of 481. pH of 7.375, Bicarb of 16.5, anion gap of 15.  He was given 2L IV NS bolus, 10mg  of Reglan and started on insulin infusion.   **Interim History  His DKA improved and he was transitioned off of insulin drip to long-acting insulin.  Diet was started and diabetes education coordinator was consulted for further evaluation  Assessment & Plan:   Principal Problem:   Hyperglycemia Active Problems:   OBSTRUCTIVE SLEEP APNEA   Pulmonary embolism (HCC)   CAD S/P DES PCI to mLAD: Xience DES 2.75 x 15 (3.0 mm)   Essential hypertension   DM (diabetes mellitus), type 2 (HCC)   S/P BKA (below knee amputation) (HCC)   COPD (chronic obstructive pulmonary disease) (HCC)   DKA, type 2 (HCC)   DVT (deep venous thrombosis) (HCC)  DKA Hyperglycemia in the  setting of uncontrolled type 2 diabetes  High Anion Gap Metabolic Acidosis  -Likely 2/2 to Noncompliance of Insulin as Patient stopped insulin use following likely gastroparesis flareup -Admitted to stepdown unit but Gap Closed and he was transitioned off the Insulin gtt so will go to Telemetry -Continue to Replete Potassium -Continue insulin gtt with goal of 140-180 and AG <12; CBG's now ranging from 112-152 -Continued IV NS until BG <250, then switch to D5 1/2 NS and is being continued on that for now -Transitoned to Long Acting Insulin 55 units BID (2/3 of Home dose) and will place on Heart Healthy Carb Modified Diet -Beta-Hydroxybutyric Acid was elevated and now went from 7.74 -> 0.45 -> 1.49 -Patient's O2 is now 22, chloride level is 105, and anion gap is 9 -Continued BMPs every 4 hours but will stop now -Initially kept n.p.o. but will change to diet as above -Continue with PRN Antiemetics -Currently holding his Metformin 500 mg p.o. daily -Consulted diabetes education coordinator for further education  History of PE/DVT -During his past admission back in December he stopped Eliquis use for the past week on his own due to upcoming dental procedure and was diagnosed with acute DVT at that time -Resume apixaban 5 mg p.o. twice daily and will continue for now -He is significantly tachycardic but is likely due to his hyperglycemia.   -However if he continues to  be tachycardic following resolution of his anion gap, will consider CTA angio to assess for any recurrent PE  CAD s/p stent -Stable. Asymptomatic -Resume aspirin, metoprolol, statin and will hold his clopidogrel given that he still will be anticoagulated with Apixaban -We will resume nitroglycerin sublingually as well as ranolazine 500 g p.o. twice daily -His clopidogrel should have been discontinued last admission given that he was switched to apixaban but will discuss with his primary care and cardiologist  Dr. Allyson Sabal if able    Hypertension -Continue anti-hypertensives metoprolol and will also resume his amlodipine 10 mg daily and metoprolol tartrate 25 mg p.o. twice daily-  COPD -Not in any acute exacerbation  OSA  -Not on CPAP but will order one   s/p Left BKA -pt is wheelchair bound -Obtain PT/OT to evaluate and Treat  Hypokalemia -Patient potassium was 3.3  -Replete with p.o. potassium chloride 40 mg twice daily x2 doses  -Continue monitor and replete as necessary -Repeat CMP in a.m.  Normocytic Anemia -Likely was hemoconcentrated on admission and had a dilutional drop with fluid resuscitation his hemoglobin/hematocrit went from 15.3/47.4 is now 12.5/37.6 -Check anemia panel in the a.m. -Continue to monitor for signs and symptoms of bleeding; currently no overt bleeding noted -Repeat CBC in a.m.  DVT prophylaxis: Anticoagulated with Apixaban  Code Status: DO NOT RESUSCITATE Family Communication: No family present at bedside  Disposition Plan: Transition to inpatient telemetry now that he does not require any stepdown unit monitoring and will anticipate discharge in next 24 to 48 hours if he is able to tolerate his diet his blood sugars are relatively well controlled   Consultants:   None  Procedures: None   Antimicrobials:  Anti-infectives (From admission, onward)   None     Subjective: Seen and examined this morning and he is extremely nauseous and did not appear comfortable.  States that he has been vomiting.  No lightheadedness or dizziness.  Wanting to rest.  Later on the nursing states that he improved.   Objective: Vitals:   09/24/19 1230 09/24/19 1300 09/24/19 1330 09/24/19 1400  BP: 114/68 117/60 131/67 128/71  Pulse: 89 88 81 88  Resp: 16 16 16 16   Temp:      TempSrc:      SpO2: 97% 98% 100% 96%    Intake/Output Summary (Last 24 hours) at 09/24/2019 1441 Last data filed at 09/23/2019 2219 Gross per 24 hour  Intake 2200 ml  Output 300 ml  Net 1900 ml   There  were no vitals filed for this visit.  Examination: Physical Exam:  Constitutional: WN/WD obese Caucasian male currently who appeared uncomfortable Eyes: Lids and conjunctivae normal, sclerae anicteric  ENMT: External Ears, Nose appear normal. Grossly normal hearing.  Neck: Appears normal, supple, no cervical masses, normal ROM, no appreciable thyromegaly; no JVD Respiratory: Diminished to auscultation bilaterally, no wheezing, rales, rhonchi or crackles. Normal respiratory effort and patient is not tachypenic. No accessory muscle use.  Cardiovascular: RRR, no murmurs / rubs / gallops. S1 and S2 auscultated. No extremity edema. 2+ pedal pulses. No carotid bruits.  Abdomen: Soft, non-tender, non-distended. No masses palpated. No appreciable hepatosplenomegaly. Bowel sounds positive.  GU: Deferred. Musculoskeletal: No clubbing / cyanosis of digits/nails. Has a Left BKA Skin: No rashes, lesions, ulcers on a limited skin evaluation. No induration; Warm and dry.  Neurologic: CN 2-12 grossly intact with no focal deficits. Romberg sign and cerebellar reflexes not assessed.  Psychiatric: Normal judgment and insight. Alert and oriented x 3.  Anxious mood and appropriate affect.   Data Reviewed: I have personally reviewed following labs and imaging studies  CBC: Recent Labs  Lab 09/23/19 1800 09/24/19 0512  WBC 14.3* 13.8*  NEUTROABS 13.1*  --   HGB 15.3 12.5*  HCT 47.4 37.6*  MCV 92.6 91.0  PLT 220 947   Basic Metabolic Panel: Recent Labs  Lab 09/23/19 1800 09/23/19 2055 09/24/19 0511 09/24/19 0855 09/24/19 1200  NA 139 140 137 140 136  K 4.7 4.0 4.2 4.4 3.3*  CL 97* 104 106 108 105  CO2 17* 19* 22 23 22   GLUCOSE 481* 352* 168* 132* 162*  BUN 20 21 21  24* 23  CREATININE 1.16 1.33* 0.93 0.97 0.94  CALCIUM 9.6 8.8* 7.9* 8.5* 8.4*   GFR: CrCl cannot be calculated (Unknown ideal weight.). Liver Function Tests: Recent Labs  Lab 09/23/19 1800  AST 18  ALT 14  ALKPHOS 69   BILITOT 2.0*  PROT 7.9  ALBUMIN 3.9   Recent Labs  Lab 09/23/19 1800  LIPASE 14   No results for input(s): AMMONIA in the last 168 hours. Coagulation Profile: No results for input(s): INR, PROTIME in the last 168 hours. Cardiac Enzymes: No results for input(s): CKTOTAL, CKMB, CKMBINDEX, TROPONINI in the last 168 hours. BNP (last 3 results) No results for input(s): PROBNP in the last 8760 hours. HbA1C: No results for input(s): HGBA1C in the last 72 hours. CBG: Recent Labs  Lab 09/24/19 0851 09/24/19 0952 09/24/19 1058 09/24/19 1205 09/24/19 1417  GLUCAP 112* 140* 152* 149* 140*   Lipid Profile: No results for input(s): CHOL, HDL, LDLCALC, TRIG, CHOLHDL, LDLDIRECT in the last 72 hours. Thyroid Function Tests: No results for input(s): TSH, T4TOTAL, FREET4, T3FREE, THYROIDAB in the last 72 hours. Anemia Panel: No results for input(s): VITAMINB12, FOLATE, FERRITIN, TIBC, IRON, RETICCTPCT in the last 72 hours. Sepsis Labs: No results for input(s): PROCALCITON, LATICACIDVEN in the last 168 hours.  Recent Results (from the past 240 hour(s))  SARS CORONAVIRUS 2 (TAT 6-24 HRS) Nasopharyngeal Nasopharyngeal Swab     Status: None   Collection Time: 09/23/19  8:25 PM   Specimen: Nasopharyngeal Swab  Result Value Ref Range Status   SARS Coronavirus 2 NEGATIVE NEGATIVE Final    Comment: (NOTE) SARS-CoV-2 target nucleic acids are NOT DETECTED. The SARS-CoV-2 RNA is generally detectable in upper and lower respiratory specimens during the acute phase of infection. Negative results do not preclude SARS-CoV-2 infection, do not rule out co-infections with other pathogens, and should not be used as the sole basis for treatment or other patient management decisions. Negative results must be combined with clinical observations, patient history, and epidemiological information. The expected result is Negative. Fact Sheet for Patients: SugarRoll[employer]be Fact  Sheet for Healthcare Providers: https://www.woods-mathews.com/ This test is not yet approved or cleared by the [place] FDA and  has been authorized for detection and/or diagnosis of SARS-CoV-2 by FDA under an Emergency Use Authorization (EUA). This EUA will remain  in effect (meaning this test can be used) for the duration of the COVID-19 declaration under Section 56 4(b)(1) of the Act, 21 U.S.C. section 360bbb-3(b)(1), unless the authorization is terminated or revoked sooner. Performed at [facility], [place] [address], [place], [place] [address]      RN Pressure Injury Documentation:     Estimated body mass index is 33.62 kg/m as calculated from the following:   Height as of 06/22/19: 6\' 3"  (1.905 m).   Weight as of 06/22/19: 122 kg.  Malnutrition Type:      Malnutrition Characteristics:      Nutrition Interventions:     Radiology Studies: DG Chest Portable 1 View  Result Date: 09/23/2019 CLINICAL DATA:  69 year old male with diabetic ketoacidosis. Nausea vomiting and diarrhea. EXAM: PORTABLE CHEST 1 VIEW COMPARISON:  Chest radiograph dated 11/12/2018. FINDINGS: The lungs are clear. There is no pleural effusion or pneumothorax. The cardiac silhouette is within limits. No acute osseous pathology. IMPRESSION: No active disease. Electronically Signed   By: Elgie Collard M.D.   On: 09/23/2019 20:36   Scheduled Meds:  apixaban  5 mg Oral BID   insulin aspart  0-15 Units Subcutaneous TID WC   insulin aspart  0-5 Units Subcutaneous QHS   insulin glargine  55 Units Subcutaneous BID   potassium chloride  40 mEq Oral BID   Continuous Infusions:  dextrose 5 % and 0.45% NaCl 75 mL/hr at 09/24/19 1215   insulin 2.8 Units/hr (09/24/19 1418)    LOS: 1 day   Merlene Laughter, DO [facility] PAGER is on AMION  If 7PM-7AM, please contact night-coverage [email or web address]

## 2019-09-24 NOTE — Consult Note (Signed)
[facility]--  Pt is active with [facility] since May 2019 for COPD.  [facility] RN and SW provide home-based support to pt.  Please notify [facility] if we can assist with d/c planning.  Will plan to resume [facility] services when pt returns home.  Julius Bowels, RN [phone]

## 2019-09-25 LAB — COMPREHENSIVE METABOLIC PANEL
ALT: 11 U/L (ref 0–44)
AST: 15 U/L (ref 15–41)
Albumin: 3.1 g/dL — ABNORMAL LOW (ref 3.5–5.0)
Alkaline Phosphatase: 49 U/L (ref 38–126)
Anion gap: 14 (ref 5–15)
BUN: 16 mg/dL (ref 8–23)
CO2: 24 mmol/L (ref 22–32)
Calcium: 8.8 mg/dL — ABNORMAL LOW (ref 8.9–10.3)
Chloride: 101 mmol/L (ref 98–111)
Creatinine, Ser: 0.63 mg/dL (ref 0.61–1.24)
GFR calc Af Amer: >60 mL/min (ref >60)
GFR calc non Af Amer: >60 mL/min (ref >60)
Glucose, Bld: 190 mg/dL — ABNORMAL HIGH (ref 70–99)
Potassium: 3.5 mmol/L (ref 3.5–5.1)
Sodium: 139 mmol/L (ref 135–145)
Total Bilirubin: 0.6 mg/dL (ref 0.3–1.2)
Total Protein: 6.3 g/dL — ABNORMAL LOW (ref 6.5–8.1)

## 2019-09-25 LAB — CBC WITH DIFFERENTIAL/PLATELET
Abs Immature Granulocytes: 0.06 10*3/uL (ref 0.00–0.07)
Basophils Absolute: 0.1 10*3/uL (ref 0.0–0.1)
Basophils Relative: 1 %
Eosinophils Absolute: 0.3 10*3/uL (ref 0.0–0.5)
Eosinophils Relative: 3 %
HCT: 38.8 % — ABNORMAL LOW (ref 39.0–52.0)
Hemoglobin: 12.8 g/dL — ABNORMAL LOW (ref 13.0–17.0)
Immature Granulocytes: 1 %
Lymphocytes Relative: 22 %
Lymphs Abs: 2.1 10*3/uL (ref 0.7–4.0)
MCH: 30.2 pg (ref 26.0–34.0)
MCHC: 33 g/dL (ref 30.0–36.0)
MCV: 91.5 fL (ref 80.0–100.0)
Monocytes Absolute: 0.7 10*3/uL (ref 0.1–1.0)
Monocytes Relative: 7 %
Neutro Abs: 6.4 10*3/uL (ref 1.7–7.7)
Neutrophils Relative %: 66 %
Platelets: 180 10*3/uL (ref 150–400)
RBC: 4.24 MIL/uL (ref 4.22–5.81)
RDW: 13.2 % (ref 11.5–15.5)
WBC: 9.5 10*3/uL (ref 4.0–10.5)
nRBC: 0 % (ref 0.0–0.2)

## 2019-09-25 LAB — GLUCOSE, CAPILLARY
Glucose-Capillary: 162 mg/dL — ABNORMAL HIGH (ref 70–99)
Glucose-Capillary: 190 mg/dL — ABNORMAL HIGH (ref 70–99)

## 2019-09-25 LAB — MAGNESIUM: Magnesium: 1.6 mg/dL — ABNORMAL LOW (ref 1.7–2.4)

## 2019-09-25 LAB — PHOSPHORUS: Phosphorus: 2.2 mg/dL — ABNORMAL LOW (ref 2.5–4.6)

## 2019-09-25 MED ORDER — BASAGLAR KWIKPEN 100 UNIT/ML ~~LOC~~ SOPN: 55.0000 [IU] | PEN_INJECTOR | Freq: Two times a day (BID) | SUBCUTANEOUS | Status: DC

## 2019-09-25 MED ORDER — ACETAMINOPHEN 325 MG PO TABS
650.0000 mg | ORAL_TABLET | Freq: Four times a day (QID) | ORAL | Status: DC | PRN
  Administered 2019-09-25: 650 mg via ORAL
  Filled 2019-09-25: qty 2

## 2019-09-25 MED ORDER — MAGNESIUM SULFATE 2 GM/50ML IV SOLN
2.0000 g | Freq: Once | INTRAVENOUS | Status: AC
  Administered 2019-09-25: 2 g via INTRAVENOUS
  Filled 2019-09-25: qty 50

## 2019-09-25 MED ORDER — ALUM & MAG HYDROXIDE-SIMETH 200-200-20 MG/5ML PO SUSP: 15.0000 mL | Freq: Four times a day (QID) | ORAL | 0 refills | Status: AC | PRN

## 2019-09-25 MED ORDER — MAGIC MOUTHWASH W/LIDOCAINE
5.0000 mL | Freq: Four times a day (QID) | ORAL | Status: DC | PRN
  Administered 2019-09-25: 5 mL via ORAL
  Filled 2019-09-25 (×4): qty 5

## 2019-09-25 MED ORDER — POTASSIUM CHLORIDE CRYS ER 20 MEQ PO TBCR
40.0000 meq | EXTENDED_RELEASE_TABLET | Freq: Once | ORAL | Status: AC
  Administered 2019-09-25: 40 meq via ORAL
  Filled 2019-09-25: qty 2

## 2019-09-25 MED ORDER — POTASSIUM PHOSPHATES 15 MMOLE/5ML IV SOLN
20.0000 mmol | Freq: Once | INTRAVENOUS | Status: AC
  Administered 2019-09-25: 20 mmol via INTRAVENOUS
  Filled 2019-09-25: qty 6.67

## 2019-09-25 NOTE — Care Management Important Message (Signed)
Important Message  Patient Details IM Letter given to Daryel Gerald SW Case Manager to present to the Patient Name: Samuel [name] MRN: [id number] Date of Birth: [birth year]   Medicare Important Message Given:  Yes     Caren Macadam 09/25/2019, 9:55 AM

## 2019-09-25 NOTE — Plan of Care (Signed)

## 2019-09-25 NOTE — Discharge Summary (Signed)
Physician Discharge Summary  Samuel BusingJohn D [name] ZOX:[id number] DOB: 1950/09/22 DOA: 09/23/2019  PCP: Pearson GrippeKim, James, MD  Admit date: 09/23/2019 Discharge date: 09/25/2019  Admitted From: Home Disposition:  Home  Recommendations for Outpatient Follow-up:  1. Follow up with PCP in 1-2 weeks 2. Follow up with Diabetes Education Coordinator within 1-2 weeks 3. Follow with Cardiology Dr. Allyson SabalBerry within 1-2 weeks 4. Please obtain CMP/CBC, Mag, Phos in one week 5. Please follow up on the following pending results:  Home Health No Equipment/Devices: None    Discharge Condition: Stable  CODE STATUS: DO NOT RESUSCITATE Diet recommendation: Heart Healthy Carb Modified Diet  Brief/Interim Summary: HPI per Dr. Benita Gutterhing [name] on 09/23/2019 Samuel GandyJohn D Hinsonis a 69 y.o.malewith medical history significant forHx of CAD s/p stent, HTN, PE/DVT on Eliquis, COPD, OSA, Type 2 diabetes with gastroparesis s/p Lf BKAwho presents with concerns ofpersistent nausea, vomiting and diarrhea. Nausea and vomiting started last night and thinks it is due to his gastroparesis. Vomitus was nonbloody and appears bilious. He also started having diarrhea today and has had about 4-5 episodes. He denies any abdominal pain. His last insulin use was 2 days ago and states that he stopped because he did not feel well. Reports he is normally on 80 units twice daily of Basaglar. He also notes some shortness of breath. Patient has history of PE/DVT and stopped his Eliquis about a week ago on his own because he was having dental procedure coming up. He denies any chest pain. No lower extremity swelling.  He denies any tobacco, alcohol illicit drug use.  In the ED, he was afebrile, tachycardic up to 120s and stable on room air. Labs notable forBG of 481. pH of 7.375, Bicarb of 16.5, anion gap of 15.  He was given 2L IV NS bolus, 10mg  of Reglan and started on insulin infusion.  **Interim History  His DKA improved and he was transitioned off  of insulin drip to long-acting insulin.  Diet was started and diabetes education coordinator was consulted for further evaluation.  Currently improved and is placed on 55 units twice daily of insulin tolerated this well.  He had no more nausea or vomiting and he was weaned off oxygen and stable for discharge at this time will need to follow-up with PCP as well as cardiology in outpatient setting.  Discharge Diagnoses:  Principal Problem:   Hyperglycemia Active Problems:   OBSTRUCTIVE SLEEP APNEA   Pulmonary embolism (HCC)   CAD S/P DES PCI to mLAD: Xience DES 2.75 x 15 (3.0 mm)   Essential hypertension   DM (diabetes mellitus), type 2 (HCC)   S/P BKA (below knee amputation) (HCC)   COPD (chronic obstructive pulmonary disease) (HCC)   DKA, type 2 (HCC)   DVT (deep venous thrombosis) (HCC)  DKA improved Hyperglycemia in the setting of uncontrolled type 2 diabetes  High Anion Gap Metabolic Acidosis  -Likely 2/2 to Noncompliance of Insulin as Patient stopped insulin use following likely gastroparesis flareup -Admitted to stepdown unit but Gap Closed and he was transitioned off the Insulin gtt so will go to Telemetry -Continue to Replete Potassium -Continue insulin gtt with goal of 140-180 and AG <12; CBG's now ranging from 112-152 -Continued IV NS until BG <250, then switch to D5 1/2 NS and is being continued on that for now -Transitoned to Long Acting Insulin 55 units BID (2/3 of Home dose) and will place on Heart Healthy Carb Modified Diet -Hemoglobin A1c was greater than 12 previously -Beta-Hydroxybutyric Acid was elevated  and now went from 7.74 -> 0.45 -> 1.49 -Patient's CO2 is now 24, chloride level is 101, and anion gap is 14 -Continued BMPs every 4 hours but will stop now -CBGs have been ranging from 133-190 and is improved -Patient is to continue 55 units of long-acting Lantus twice daily, Humalog 5 to 18 units sliding scale, as well as Metformin in outpatient setting and will need  further adjustment of blood sugars per PCP -Initially kept n.p.o. but will change to diet as above -Continue with PRN Antiemetics -Currently holding his Metformin 500 mg p.o. daily -Consulted diabetes education coordinator for further education and patient had a lengthy talk with the diabetes education coordinator -Follow-up with PCP in outpatient setting  History of PE/DVT -During his past admission back in December he stopped Eliquis use for the past week on his own due to upcoming dental procedure and was diagnosed with acute DVT at that time -Resume apixaban 5 mg p.o. twice daily and will continue for now -He is significantly tachycardic but is likely due to his hyperglycemia.  -However if he continues to be tachycardic following resolution of his anion gap, will consider CTA angio to assess for any recurrent PE  CAD s/p stent -Stable. Asymptomatic -Resume aspirin, metoprolol, statin and will hold his clopidogrel given that he still will be anticoagulated with Apixaban -We will resume nitroglycerin sublingually as well as ranolazine 500 g p.o. twice daily -His clopidogrel should have been discontinued last admission given that he was switched to apixaban but will discuss with his primary care and cardiologist  Dr. Allyson Sabal if able; will discontinue and have the patient follow-up with Dr. Gery Pray and patient is to be on Eliquis and aspirin  Hypertension -Continue anti-hypertensives metoprolol and will also resume his amlodipine 10 mg daily and metoprolol tartrate 25 mg p.o. twice daily-  COPD -Not in any acute exacerbation -Continue home inhalers  OSA  -Not on CPAP but will order one   Right leg lesion -Continue outpatient wound care with PCP  s/p Left BKA -pt is wheelchair bound -Obtain PT/OT to evaluate and Treat  Hypokalemia -Patient potassium was 3.5 -Replete with p.o. K-Phos Neutral 500 mg p.o. twice daily -Continue monitor and replete as necessary -Repeat CMP in  a.m.  Hypomagnesemia -Patient's magnesium was 1.66-replete with IV mag sulfate 2 g Plan continue to monitor and replete as necessary -Repeat magnesium level in the outpatient setting  Hypophosphatemia -Patient's phosphorus level was 2.2 -Replete with p.o. K-Phos Neutral 500 mg p.o. twice daily -Continue to monitor and replete as necessary -Repeat phosphorus level in the outpatient setting  Normocytic Anemia -Likely was hemoconcentrated on admission and had a dilutional drop with fluid resuscitation his hemoglobin/hematocrit went from 15.3/47.4 is now 12.8/38.8 -Check anemia panel as an outpatient -Continue to monitor for signs and symptoms of bleeding; currently no overt bleeding noted -Repeat CBC in a.m.  Discharge Instructions  Discharge Instructions    Call MD for:  difficulty breathing, headache or visual disturbances   Complete by: As directed    Call MD for:  extreme fatigue   Complete by: As directed    Call MD for:  hives   Complete by: As directed    Call MD for:  persistant dizziness or light-headedness   Complete by: As directed    Call MD for:  persistant nausea and vomiting   Complete by: As directed    Call MD for:  redness, tenderness, or signs of infection (pain, swelling, redness, odor or green/yellow discharge  around incision site)   Complete by: As directed    Call MD for:  severe uncontrolled pain   Complete by: As directed    Call MD for:  temperature >100.4   Complete by: As directed    Diet - low sodium heart healthy   Complete by: As directed    Diet Carb Modified   Complete by: As directed    Discharge instructions   Complete by: As directed    You were cared for by a hospitalist during your hospital stay. If you have any questions about your discharge medications or the care you received while you were in the hospital after you are discharged, you can call the unit and ask to speak with the hospitalist on call if the hospitalist that took care of  you is not available. Once you are discharged, your primary care physician will handle any further medical issues. Please note that NO REFILLS for any discharge medications will be authorized once you are discharged, as it is imperative that you return to your primary care physician (or establish a relationship with a primary care physician if you do not have one) for your aftercare needs so that they can reassess your need for medications and monitor your lab values.  Follow up with PCP and Cardiology within 1-2 weeks. Take all medications as prescribed. If symptoms change or worsen please return to the ED for evaluation   Increase activity slowly   Complete by: As directed      Allergies as of 09/25/2019      Reactions   Gabapentin Hives, Itching, Nausea And Vomiting, Rash   REACTION: rash/hives (per patient, was a reaction to an inactive ingredient in another mgf brand). The patient stated that he does take this medication now and it doesn't cause a rash any more.   Relafen [nabumetone] Nausea And Vomiting, Rash   Codeine Nausea And Vomiting, Rash      Medication List    STOP taking these medications   clopidogrel 75 MG tablet Commonly known as: PLAVIX     TAKE these medications   alum & mag hydroxide-simeth 200-200-20 MG/5ML suspension Commonly known as: MAALOX/MYLANTA Take 15 mLs by mouth every 6 (six) hours as needed for indigestion or heartburn.   amLODipine 10 MG tablet Commonly known as: NORVASC Take 1 tablet (10 mg total) by mouth daily.   aspirin 81 MG EC tablet Take 1 tablet (81 mg total) by mouth daily.   atorvastatin 80 MG tablet Commonly known as: LIPITOR Take 1 tablet (80 mg total) by mouth daily at 6 PM.   Basaglar KwikPen 100 UNIT/ML Inject 0.55 mLs (55 Units total) into the skin 2 (two) times daily. What changed: how much to take   docusate sodium 100 MG capsule Commonly known as: COLACE Take 100 mg by mouth daily.   Eliquis 5 MG Tabs tablet Generic  drug: apixaban Take 5 mg by mouth 2 (two) times daily. What changed: Another medication with the same name was removed. Continue taking this medication, and follow the directions you see here.   ferrous sulfate 325 (65 FE) MG tablet Take 325 mg by mouth every evening.   furosemide 20 MG tablet Commonly known as: LASIX Take 1 tablet (20 mg total) by mouth daily. What changed:   when to take this  reasons to take this   gabapentin 300 MG capsule Commonly known as: NEURONTIN Take 600 mg by mouth 2 (two) times daily.   insulin lispro 100  UNIT/ML injection Commonly known as: HUMALOG Inject 5-18 Units into the skin See admin instructions. units three times daily before meals Sliding Scale 150-250 = 5 units 251-350= 6-8 units 351-450= 10 units Over 450 18 units   metFORMIN 500 MG 24 hr tablet Commonly known as: GLUCOPHAGE-XR Take 500 mg by mouth daily.   metoprolol tartrate 25 MG tablet Commonly known as: LOPRESSOR Take 1 tablet (25 mg total) by mouth 2 (two) times daily.   nitroGLYCERIN 0.4 MG SL tablet Commonly known as: NITROSTAT Place 1 tablet (0.4 mg total) under the tongue every 5 (five) minutes as needed for chest pain.   OneTouch Verio test strip Generic drug: glucose blood Use to test blood sugar twice daily   pantoprazole 40 MG tablet Commonly known as: PROTONIX Take 1 tablet (40 mg total) by mouth 2 (two) times daily before a meal.   promethazine 25 MG tablet Commonly known as: PHENERGAN Take 25-50 mg by mouth every 6 (six) hours as needed for nausea or vomiting. What changed: Another medication with the same name was removed. Continue taking this medication, and follow the directions you see here.   ranolazine 500 MG 12 hr tablet Commonly known as: RANEXA Take 1 tablet (500 mg total) by mouth 2 (two) times daily.   traMADol 50 MG tablet Commonly known as: ULTRAM Take 100 mg by mouth every 6 (six) hours as needed for moderate pain.      Follow-up  Information    [name], MD Follow up.   Specialty: Internal Medicine Why: Please follow up with your PCP within 5 business days of your d/c from the hospital Contact information: [address] [place] [address] [phone]          Allergies  Allergen Reactions  . Gabapentin Hives, Itching, Nausea And Vomiting and Rash    REACTION: rash/hives (per patient, was a reaction to an inactive ingredient in another mgf brand). The patient stated that he does take this medication now and it doesn't cause a rash any more.  . Relafen [Nabumetone] Nausea And Vomiting and Rash  . Codeine Nausea And Vomiting and Rash   Consultations: Diabetes Education Coordinator   Procedures/Studies: DG Chest Portable 1 View  Result Date: 09/23/2019 CLINICAL DATA:  69 year old male with diabetic ketoacidosis. Nausea vomiting and diarrhea. EXAM: PORTABLE CHEST 1 VIEW COMPARISON:  Chest radiograph dated 11/12/2018. FINDINGS: The lungs are clear. There is no pleural effusion or pneumothorax. The cardiac silhouette is within limits. No acute osseous pathology. IMPRESSION: No active disease. Electronically Signed   By: Elgie Collard M.D.   On: 09/23/2019 20:36     Subjective: Seen and examined at bedside he is doing better today.  Denied complaints or concerns.  No nausea or vomiting.  Feels well and is stable to be discharged home at this time.  Discharge Exam: Vitals:   09/25/19 1256 09/25/19 1452  BP:  (!) 149/83  Pulse:  78  Resp: 16 20  Temp:  98.6 F (37 C)  SpO2: 97% 96%   Vitals:   09/24/19 2359 09/25/19 0427 09/25/19 1256 09/25/19 1452  BP: (!) 149/91 (!) 117/95  (!) 149/83  Pulse: 79 76  78  Resp: 18 16 16 20   Temp: 98.3 F (36.8 C) 98.1 F (36.7 C)  98.6 F (37 C)  TempSrc: Oral Oral  Oral  SpO2: 98% 99% 97% 96%   General: Pt is alert, awake, not in acute distress Cardiovascular: RRR, S1/S2 +, no rubs, no  gallops Respiratory: Mildly diminished bilaterally,  no wheezing, no rhonchi; unlabored breathing Abdominal: Soft, NT, distended secondary body habitus, bowel sounds + Extremities: Trace edema and has a lesion on his right leg, no cyanosis; has a left BKA  The results of significant diagnostics from this hospitalization (including imaging, microbiology, ancillary and laboratory) are listed below for reference.    Microbiology: Recent Results (from the past 240 hour(s))  SARS CORONAVIRUS 2 (TAT 6-24 HRS) Nasopharyngeal Nasopharyngeal Swab     Status: None   Collection Time: 09/23/19  8:25 PM   Specimen: Nasopharyngeal Swab  Result Value Ref Range Status   SARS Coronavirus 2 NEGATIVE NEGATIVE Final    Comment: (NOTE) SARS-CoV-2 target nucleic acids are NOT DETECTED. The SARS-CoV-2 RNA is generally detectable in upper and lower respiratory specimens during the acute phase of infection. Negative results do not preclude SARS-CoV-2 infection, do not rule out co-infections with other pathogens, and should not be used as the sole basis for treatment or other patient management decisions. Negative results must be combined with clinical observations, patient history, and epidemiological information. The expected result is Negative. Fact Sheet for Patients: HairSlick.no Fact Sheet for Healthcare Providers: quierodirigir.com This test is not yet approved or cleared by the [place] FDA and  has been authorized for detection and/or diagnosis of SARS-CoV-2 by FDA under an Emergency Use Authorization (EUA). This EUA will remain  in effect (meaning this test can be used) for the duration of the COVID-19 declaration under Section 56 4(b)(1) of the Act, 21 U.S.C. section 360bbb-3(b)(1), unless the authorization is terminated or revoked sooner. Performed at [facility], [address], [place], [place] [address]    Labs: BNP (last 3 results) No results for input(s): BNP in the last  8760 hours. Basic Metabolic Panel: Recent Labs  Lab 09/23/19 2055 09/24/19 0511 09/24/19 0855 09/24/19 1200 09/25/19 0557  NA 140 137 140 136 139  K 4.0 4.2 4.4 3.3* 3.5  CL 104 106 108 105 101  CO2 19* 22 23 22 24   GLUCOSE 352* 168* 132* 162* 190*  BUN 21 21 24* 23 16  CREATININE 1.33* 0.93 0.97 0.94 0.63  CALCIUM 8.8* 7.9* 8.5* 8.4* 8.8*  MG  --   --   --   --  1.6*  PHOS  --   --   --   --  2.2*   Liver Function Tests: Recent Labs  Lab 09/23/19 1800 09/25/19 0557  AST 18 15  ALT 14 11  ALKPHOS 69 49  BILITOT 2.0* 0.6  PROT 7.9 6.3*  ALBUMIN 3.9 3.1*   Recent Labs  Lab 09/23/19 1800  LIPASE 14   No results for input(s): AMMONIA in the last 168 hours. CBC: Recent Labs  Lab 09/23/19 1800 09/24/19 0512 09/25/19 0557  WBC 14.3* 13.8* 9.5  NEUTROABS 13.1*  --  6.4  HGB 15.3 12.5* 12.8*  HCT 47.4 37.6* 38.8*  MCV 92.6 91.0 91.5  PLT 220 221 180   Cardiac Enzymes: No results for input(s): CKTOTAL, CKMB, CKMBINDEX, TROPONINI in the last 168 hours. BNP: Invalid input(s): POCBNP CBG: Recent Labs  Lab 09/24/19 1600 09/24/19 1740 09/24/19 2111 09/25/19 0729 09/25/19 1312  GLUCAP 133* 156* 166* 162* 190*   D-Dimer No results for input(s): DDIMER in the last 72 hours. Hgb A1c No results for input(s): HGBA1C in the last 72 hours. Lipid Profile No results for input(s): CHOL, HDL, LDLCALC, TRIG, CHOLHDL, LDLDIRECT in the last 72 hours. Thyroid function studies  No results for input(s): TSH, T4TOTAL, T3FREE, THYROIDAB in the last 72 hours.  Invalid input(s): FREET3 Anemia work up No results for input(s): VITAMINB12, FOLATE, FERRITIN, TIBC, IRON, RETICCTPCT in the last 72 hours. Urinalysis    Component Value Date/Time   COLORURINE YELLOW 09/23/2019 1811   APPEARANCEUR CLEAR 09/23/2019 1811   LABSPEC 1.025 09/23/2019 1811   PHURINE 6.0 09/23/2019 1811   GLUCOSEU >=500 (A) 09/23/2019 1811   HGBUR NEGATIVE 09/23/2019 1811   BILIRUBINUR NEGATIVE  09/23/2019 1811   KETONESUR 80 (A) 09/23/2019 1811   PROTEINUR 100 (A) 09/23/2019 1811   UROBILINOGEN 1.0 04/05/2015 1155   NITRITE NEGATIVE 09/23/2019 1811   LEUKOCYTESUR NEGATIVE 09/23/2019 1811   Sepsis Labs Invalid input(s): PROCALCITONIN,  WBC,  LACTICIDVEN Microbiology Recent Results (from the past 240 hour(s))  SARS CORONAVIRUS 2 (TAT 6-24 HRS) Nasopharyngeal Nasopharyngeal Swab     Status: None   Collection Time: 09/23/19  8:25 PM   Specimen: Nasopharyngeal Swab  Result Value Ref Range Status   SARS Coronavirus 2 NEGATIVE NEGATIVE Final    Comment: (NOTE) SARS-CoV-2 target nucleic acids are NOT DETECTED. The SARS-CoV-2 RNA is generally detectable in upper and lower respiratory specimens during the acute phase of infection. Negative results do not preclude SARS-CoV-2 infection, do not rule out co-infections with other pathogens, and should not be used as the sole basis for treatment or other patient management decisions. Negative results must be combined with clinical observations, patient history, and epidemiological information. The expected result is Negative. Fact Sheet for Patients: SugarRoll.be Fact Sheet for Healthcare Providers: https://www.woods-mathews.com/ This test is not yet approved or cleared by the [place] FDA and  has been authorized for detection and/or diagnosis of SARS-CoV-2 by FDA under an Emergency Use Authorization (EUA). This EUA will remain  in effect (meaning this test can be used) for the duration of the COVID-19 declaration under Section 56 4(b)(1) of the Act, 21 U.S.C. section 360bbb-3(b)(1), unless the authorization is terminated or revoked sooner. Performed at [facility], [address], [place], [place] [address]    Time coordinating discharge: 35 minutes  SIGNED:  Kerney Elbe, DO [facility] 09/25/2019, 9:15 PM Pager is on [place]  If 7PM-7AM, please contact  night-coverage [email or web address] Password [email or web address]

## 2019-10-16 ENCOUNTER — Encounter (HOSPITAL_COMMUNITY): Payer: Self-pay

## 2019-10-16 ENCOUNTER — Emergency Department (HOSPITAL_COMMUNITY)
Admission: EM | Admit: 2019-10-16 | Discharge: 2019-10-16 | Disposition: A | Discharge status: Home / Self Care | Payer: PPO | Attending: Emergency Medicine | Admitting: Emergency Medicine

## 2019-10-16 ENCOUNTER — Other Ambulatory Visit: Payer: Self-pay

## 2019-10-16 DIAGNOSIS — Z79899 Other long term (current) drug therapy: Secondary | ICD-10-CM | POA: Insufficient documentation

## 2019-10-16 DIAGNOSIS — J449 Chronic obstructive pulmonary disease, unspecified: Secondary | ICD-10-CM | POA: Insufficient documentation

## 2019-10-16 DIAGNOSIS — Z794 Long term (current) use of insulin: Secondary | ICD-10-CM | POA: Diagnosis not present

## 2019-10-16 DIAGNOSIS — R110 Nausea: Secondary | ICD-10-CM | POA: Diagnosis not present

## 2019-10-16 DIAGNOSIS — I10 Essential (primary) hypertension: Secondary | ICD-10-CM | POA: Diagnosis not present

## 2019-10-16 DIAGNOSIS — Z7982 Long term (current) use of aspirin: Secondary | ICD-10-CM | POA: Insufficient documentation

## 2019-10-16 DIAGNOSIS — I959 Hypotension, unspecified: Secondary | ICD-10-CM | POA: Diagnosis not present

## 2019-10-16 DIAGNOSIS — E1165 Type 2 diabetes mellitus with hyperglycemia: Secondary | ICD-10-CM | POA: Diagnosis not present

## 2019-10-16 DIAGNOSIS — I252 Old myocardial infarction: Secondary | ICD-10-CM | POA: Diagnosis not present

## 2019-10-16 DIAGNOSIS — Z7901 Long term (current) use of anticoagulants: Secondary | ICD-10-CM | POA: Insufficient documentation

## 2019-10-16 DIAGNOSIS — R0902 Hypoxemia: Secondary | ICD-10-CM | POA: Diagnosis not present

## 2019-10-16 DIAGNOSIS — E1140 Type 2 diabetes mellitus with diabetic neuropathy, unspecified: Secondary | ICD-10-CM | POA: Diagnosis not present

## 2019-10-16 DIAGNOSIS — R109 Unspecified abdominal pain: Secondary | ICD-10-CM | POA: Insufficient documentation

## 2019-10-16 DIAGNOSIS — R112 Nausea with vomiting, unspecified: Secondary | ICD-10-CM | POA: Insufficient documentation

## 2019-10-16 DIAGNOSIS — Z89512 Acquired absence of left leg below knee: Secondary | ICD-10-CM | POA: Insufficient documentation

## 2019-10-16 LAB — CBC WITH DIFFERENTIAL/PLATELET
Abs Immature Granulocytes: 0.02 10*3/uL (ref 0.00–0.07)
Basophils Absolute: 0 10*3/uL (ref 0.0–0.1)
Basophils Relative: 1 %
Eosinophils Absolute: 0.2 10*3/uL (ref 0.0–0.5)
Eosinophils Relative: 4 %
HCT: 39.7 % (ref 39.0–52.0)
Hemoglobin: 12.6 g/dL — ABNORMAL LOW (ref 13.0–17.0)
Immature Granulocytes: 0 %
Lymphocytes Relative: 26 %
Lymphs Abs: 1.4 10*3/uL (ref 0.7–4.0)
MCH: 29.8 pg (ref 26.0–34.0)
MCHC: 31.7 g/dL (ref 30.0–36.0)
MCV: 93.9 fL (ref 80.0–100.0)
Monocytes Absolute: 0.5 10*3/uL (ref 0.1–1.0)
Monocytes Relative: 8 %
Neutro Abs: 3.4 10*3/uL (ref 1.7–7.7)
Neutrophils Relative %: 61 %
Platelets: 270 10*3/uL (ref 150–400)
RBC: 4.23 MIL/uL (ref 4.22–5.81)
RDW: 13.2 % (ref 11.5–15.5)
WBC: 5.6 10*3/uL (ref 4.0–10.5)
nRBC: 0 % (ref 0.0–0.2)

## 2019-10-16 LAB — COMPREHENSIVE METABOLIC PANEL
ALT: 15 U/L (ref 0–44)
AST: 20 U/L (ref 15–41)
Albumin: 3.4 g/dL — ABNORMAL LOW (ref 3.5–5.0)
Alkaline Phosphatase: 62 U/L (ref 38–126)
Anion gap: 9 (ref 5–15)
BUN: 14 mg/dL (ref 8–23)
CO2: 26 mmol/L (ref 22–32)
Calcium: 8.9 mg/dL (ref 8.9–10.3)
Chloride: 101 mmol/L (ref 98–111)
Creatinine, Ser: 0.83 mg/dL (ref 0.61–1.24)
GFR calc Af Amer: >60 mL/min (ref >60)
GFR calc non Af Amer: >60 mL/min (ref >60)
Glucose, Bld: 208 mg/dL — ABNORMAL HIGH (ref 70–99)
Potassium: 4.4 mmol/L (ref 3.5–5.1)
Sodium: 136 mmol/L (ref 135–145)
Total Bilirubin: 1.1 mg/dL (ref 0.3–1.2)
Total Protein: 7.1 g/dL (ref 6.5–8.1)

## 2019-10-16 LAB — LIPASE, BLOOD: Lipase: 15 U/L (ref 11–51)

## 2019-10-16 MED ORDER — SODIUM CHLORIDE 0.9 % IV BOLUS
500.0000 mL | Freq: Once | INTRAVENOUS | Status: AC
  Administered 2019-10-16: 500 mL via INTRAVENOUS

## 2019-10-16 MED ORDER — METOCLOPRAMIDE HCL 5 MG/ML IJ SOLN
10.0000 mg | Freq: Once | INTRAMUSCULAR | Status: AC
  Administered 2019-10-16: 10 mg via INTRAVENOUS
  Filled 2019-10-16: qty 2

## 2019-10-16 NOTE — Progress Notes (Signed)
[facility].  Pt is active with [facility] services at home for COPD.  Will follow hospital course and plan to resume care at time of d/c.  Of note, pt lives alone but has the help of a cousin and cousin's wife intermittently with ADL's.  Please call [facility] with questions or assistance with d/c planning.  Julius Bowels, RN Office [phone]

## 2019-10-16 NOTE — ED Triage Notes (Addendum)
Pt arrived via [facility] from home [facility] X 2 days. Per EMS A/OX4 and L BKA at baseline,. Pt denies dizziness or chest pain. Pt report emesis is "all bile no blood"   18G RAC 4mg  Zofran 500 NS    Hx Diabetes, gastroparesis

## 2019-10-16 NOTE — ED Provider Notes (Signed)
[facility] DEPT Provider Note   CSN: [id number] Arrival date & time: 10/16/19  1105     History Chief Complaint  Patient presents with  . Nausea  . Emesis    Samuel [name] is a 69 y.o. male.  HPI Patient presents with nausea and vomiting.  Had for last 2 days.  States green emesis.  History of gastroparesis and states that this with this feels like.  Has Phenergan at home and states not helping.  Had Zofran for EMS.  Still has some nausea.  Mild abdominal pain.  No chest pain.  No sick contacts.  No fevers.  No diarrhea.  Abdomen is not larger than normal.  Patient is diabetic and states sugars were around 250 at home today..  States they have been doing well before that.    Past Medical History:  Diagnosis Date  . Anxiety   . Arthritis    "all over"   . CAD (coronary artery disease)   . Charcot's joint    "left foot"  . 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 Depression   . Gastroparesis   . GERD (gastroesophageal reflux disease)   . H/O hiatal hernia   . Hyperlipidemia   . Hypertension   . Myocardial infarction ([place]) 2017/03/27   around this date  . OSA (obstructive sleep apnea)    "not bad enough for a mask"  . Peripheral neuropathy   . Peripheral vascular disease ([place])   . PONV (postoperative nausea and vomiting)   . Pulmonary embolism ([place])    hx. of 2012  . Shortness of breath    exertion  . Type II diabetes mellitus [facility])     Patient Active Problem List   Diagnosis Date Noted  . Hyperglycemia 09/23/2019  . DVT (deep venous thrombosis) ([place]) 06/09/2019  . DKA (diabetic ketoacidoses) ([place]) 10/01/2018  . Gastroparesis 08/17/2018  . Chest pain 08/16/2018  . DKA, type 2 ([place]) 02/03/2018  . DOE (dyspnea on exertion) 01/24/2018  . Uncontrolled diabetes mellitus ([place]) 09/23/2017  . DKA, type 2, not at goal [facility]) 09/22/2017  . S/P BKA (below knee amputation) ([place]) 04/19/2017  . COPD (chronic obstructive  pulmonary disease) ([place]) 04/19/2017  . NSTEMI (non-ST elevated myocardial infarction) ([place]) 04/07/2017  . Osteomyelitis of foot ([place])   . Protein-calorie malnutrition, severe ([place]) 03/08/2015  . Osteomyelitis of foot, acute ([place]) 03/07/2015  . Diabetes mellitus type 2, uncontrolled ([place]) 03/07/2015  . Normocytic normochromic anemia 03/07/2015  . Obesity (BMI 30-39.9) 03/07/2015  . Nausea & vomiting   . DM (diabetes mellitus), type 2 ([place]) 01/07/2014  . Chronic osteomyelitis involving lower leg ([place]) 01/07/2014  . Vomiting 12/22/2013  . Diabetic gastroparesis ([place]) 12/21/2013  . Intractable nausea and vomiting 12/21/2013  . Essential hypertension 05/01/2013  . Hyperlipidemia due to type 2 diabetes mellitus ([place]) 05/01/2013  . Venous stasis of lower extremity 04/15/2013  . Generalized weakness 03/20/2013  . Back pain 03/20/2013  . Pulmonary embolism ([place]) 03/27/2011  . PERFORATION OF GALLBLADDER 08/30/2010  . Ulcer of lower limb, unspecified 03/01/2010  . METHICILLIN SUSCEPTIBLE STAPH AUREUS SEPTICEMIA 01/25/2010  . Acute osteomyelitis, ankle and foot 01/25/2010  . NAUSEA AND VOMITING 01/25/2010  . GERD 11/09/2009  . CAD S/P DES PCI to mLAD: Xience DES 2.75 x 15 (3.0 mm) 05/01/2009  . DIABETES, TYPE 1 05/14/2008  . OBSTRUCTIVE SLEEP APNEA 04/30/2008  . ALLERGIC RHINITIS, SEASONAL 04/30/2008    Past Surgical History:  Procedure Laterality  Date  . AMPUTATION Left 03/08/2015   Procedure:  LEFT AMPUTATION BELOW KNEE;  Surgeon: Toni Arthurs, MD;  Location: [facility] ORS;  Service: Orthopedics;  Laterality: Left;  . APPLICATION OF WOUND VAC Left 01/07/2014  . CHOLECYSTECTOMY  06/2010  . CORONARY ANGIOPLASTY WITH STENT PLACEMENT  05/2009   "1"  . FOOT SURGERY Left 2010   "for Charcot's joint"  . HARDWARE REMOVAL Left 01/07/2014   Procedure: LEFT LEG REMOVAL OF DEEP IMPLANT AND SEQUESTRECTOMY; APPLICATION OF WOUND VAC ;  Surgeon: Toni Arthurs, MD;  Location: [facility] OR;  Service: Orthopedics;   Laterality: Left;  . I & D EXTREMITY Left "multiple"   leg  . 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 IM NAILING TIBIA Left ~ 2012  . LEFT HEART CATH AND CORONARY ANGIOGRAPHY N/A 04/08/2017   Procedure: LEFT HEART CATH AND CORONARY ANGIOGRAPHY;  Surgeon: Runell Gess, MD;  Location: [facility] INVASIVE CV LAB;  Service: Cardiovascular;  Laterality: N/A;  . SHOULDER ARTHROSCOPY W/ ROTATOR CUFF REPAIR Left    "and bone spurs"  . TIBIAL IM ROD REMOVAL Left 01/07/2014  . TOE AMPUTATION Right ~ 2011   "great toe"  . VENA CAVA FILTER PLACEMENT  2012  . VENA CAVA FILTER PLACEMENT N/A 11/20/2016   Procedure: INSERTION VENA-CAVA FILTER;  Surgeon: Sherren Kerns, MD;  Location[facility] OR;  Service: Vascular;  Laterality: N/A;  . WOUND DEBRIDEMENT Left 01/07/2014   "tibia"       Family History  Problem Relation Age of Onset  . COPD Mother   . Heart disease Mother   . Lung cancer Mother   . Retinal detachment Father   . Alcoholism Brother   . Heart disease Brother   . COPD Brother   . Diabetes Brother   . Hypertension Brother   . Stroke Brother     Social History   Tobacco Use  . Smoking status: Never Smoker  . Smokeless tobacco: Never Used  Substance Use Topics  . Alcohol use: No    Comment: 01/07/2014 'might have a wine cooler a couple times/yr"  . Drug use: No    Home Medications Prior to Admission medications   Medication Sig Start Date End Date Taking? Authorizing Provider  alum & mag hydroxide-simeth (MAALOX/MYLANTA) 200-200-20 MG/5ML suspension Take 15 mLs by mouth every 6 (six) hours as needed for indigestion or heartburn. 09/25/19   Marguerita Merles Latif, DO  amLODipine (NORVASC) 10 MG tablet Take 1 tablet (10 mg total) by mouth daily. 11/22/16   Richarda Overlie, MD  apixaban (ELIQUIS) 5 MG TABS tablet Take 5 mg by mouth 2 (two) times daily.     [provider]  aspirin 81 MG EC tablet Take 1 tablet (81 mg total) by mouth daily. 04/12/17   Glade Lloyd, MD  atorvastatin (LIPITOR) 80 MG tablet Take 1 tablet (80 mg total) by mouth daily at 6 PM. 04/11/17   Glade Lloyd, MD  docusate sodium (COLACE) 100 MG capsule Take 100 mg by mouth daily.    [provider]  ferrous sulfate 325 (65 FE) MG tablet Take 325 mg by mouth every evening.     [provider]  furosemide (LASIX) 20 MG tablet Take 1 tablet (20 mg total) by mouth daily. Patient taking differently: Take 20 mg by mouth daily as needed for fluid or edema.  04/12/17   Glade Lloyd, MD  gabapentin (NEURONTIN) 300 MG capsule Take 600 mg by mouth 2 (two) times daily.     [provider]  Insulin Glargine (BASAGLAR KWIKPEN) 100 UNIT/ML Inject 0.55 mLs (55 Units total) into the skin 2 (two) times daily. 09/25/19   Sheikh, Omair Latif, DO  insulin lispro (HUMALOG) 100 UNIT/ML injection Inject 5-18 Units into the skin See admin instructions. units three times daily before meals Sliding Scale 150-250 = 5 units 251-350= 6-8 units 351-450= 10 units Over 450 18 units    Averneni, Madhavi, MD  metFORMIN (GLUCOPHAGE-XR) 500 MG 24 hr tablet Take 500 mg by mouth daily.    [provider]  metoprolol tartrate (LOPRESSOR) 25 MG tablet Take 1 tablet (25 mg total) by mouth 2 (two) times daily. 04/11/17   Glade Lloyd, MD  nitroGLYCERIN (NITROSTAT) 0.4 MG SL tablet Place 1 tablet (0.4 mg total) under the tongue every 5 (five) minutes as needed for chest pain. 04/11/17   Glade Lloyd, MD  [facility] VERIO test strip Use to test blood sugar twice daily 09/24/17   [provider]  pantoprazole (PROTONIX) 40 MG tablet Take 1 tablet (40 mg total) by mouth 2 (two) times daily before a meal. 08/21/18   Mikhail, Nita Sells, DO  promethazine (PHENERGAN) 25 MG tablet Take 25-50 mg by mouth every 6 (six) hours as needed for nausea or vomiting.  05/05/18    [provider]  ranolazine (RANEXA) 500 MG 12 hr tablet Take 1 tablet (500 mg total) by mouth 2 (two) times daily. 04/11/17   Glade Lloyd, MD  traMADol (ULTRAM) 50 MG tablet Take 100 mg by mouth every 6 (six) hours as needed for moderate pain.     [provider]    Allergies    Gabapentin, Relafen [nabumetone], and Codeine  Review of Systems   Review of Systems  Constitutional: Positive for appetite change. Negative for fever and unexpected weight change.  HENT: Negative for congestion.   Respiratory: Negative for shortness of breath.   Gastrointestinal: Positive for abdominal pain, nausea and vomiting. Negative for diarrhea.  Genitourinary: Negative for flank pain.  Musculoskeletal: Negative for back pain.  Skin: Positive for wound.  Neurological: Negative for weakness.    Physical Exam Updated Vital Signs BP (!) 177/76   Pulse 82   Temp 99 F (37.2 C) (Oral)   Resp (!) 23   SpO2 99%   Physical Exam Vitals and nursing note reviewed.  HENT:     Head: Atraumatic.  Eyes:     Pupils: Pupils are equal, round, and reactive to light.  Cardiovascular:     Rate and Rhythm: Regular rhythm.  Pulmonary:     Breath sounds: Wheezing present.  Abdominal:     Comments: Mild abdominal distention.  Baseline for patient.  No hernia palpated.  Musculoskeletal:     Comments: Previous left below the knee amputation.  Right lower leg is wrapped patient states that her skin tears there.  Skin:    General: Skin is warm.     Capillary Refill: Capillary refill takes less than 2 seconds.  Neurological:     Mental Status: He is alert and oriented to person, place, and time. Mental status is at baseline.     ED Results / Procedures / Treatments   Labs (all labs ordered are listed, but only abnormal results are displayed) Labs Reviewed  CBC WITH DIFFERENTIAL/PLATELET - Abnormal; Notable for the following components:  Result Value   Hemoglobin 12.6 (*)    All  other components within normal limits  COMPREHENSIVE METABOLIC PANEL - Abnormal; Notable for the following components:   Glucose, Bld 208 (*)    Albumin 3.4 (*)    All other components within normal limits  LIPASE, BLOOD    EKG None  Radiology No results found.  Procedures Procedures (including critical care time)  Medications Ordered in ED Medications  metoCLOPramide (REGLAN) injection 10 mg (10 mg Intravenous Given 10/16/19 1150)  sodium chloride 0.9 % bolus 500 mL (500 mLs Intravenous New Bag/Given (Non-Interop) 10/16/19 1150)    ED Course  I have reviewed the triage vital signs and the nursing notes.  Pertinent labs & imaging results that were available during my care of the patient were reviewed by me and considered in my medical decision making (see chart for details).    MDM Rules/Calculators/A&P                      Patient with nausea and vomiting.  History of same.  Has had gastroparesis.  Lab work overall reassuring.  Given Reglan and feels much better.  Eager to be discharged home.  Will discharge follow-up with PCP. Final Clinical Impression(s) / ED Diagnoses Final diagnoses:  Non-intractable vomiting with nausea, unspecified vomiting type    Rx / DC Orders ED Discharge Orders    None       Benjiman Core, MD 10/16/19 1300

## 2019-10-16 NOTE — ED Notes (Signed)
Taxi called for transport home per patient request

## 2019-10-21 DIAGNOSIS — H35033 Hypertensive retinopathy, bilateral: Secondary | ICD-10-CM | POA: Diagnosis not present

## 2019-10-21 DIAGNOSIS — E113211 Type 2 diabetes mellitus with mild nonproliferative diabetic retinopathy with macular edema, right eye: Secondary | ICD-10-CM | POA: Diagnosis not present

## 2019-10-21 DIAGNOSIS — E113292 Type 2 diabetes mellitus with mild nonproliferative diabetic retinopathy without macular edema, left eye: Secondary | ICD-10-CM | POA: Diagnosis not present

## 2019-10-21 DIAGNOSIS — H35433 Paving stone degeneration of retina, bilateral: Secondary | ICD-10-CM | POA: Diagnosis not present

## 2019-10-21 LAB — HM DIABETES EYE EXAM

## 2019-11-18 ENCOUNTER — Encounter (INDEPENDENT_AMBULATORY_CARE_PROVIDER_SITE_OTHER): Payer: PPO | Admitting: Ophthalmology

## 2020-01-06 DIAGNOSIS — Z89512 Acquired absence of left leg below knee: Secondary | ICD-10-CM | POA: Diagnosis not present

## 2020-01-06 DIAGNOSIS — R2681 Unsteadiness on feet: Secondary | ICD-10-CM | POA: Diagnosis not present

## 2020-01-06 DIAGNOSIS — Z9989 Dependence on other enabling machines and devices: Secondary | ICD-10-CM | POA: Diagnosis not present

## 2020-01-06 DIAGNOSIS — Z9181 History of falling: Secondary | ICD-10-CM | POA: Diagnosis not present

## 2020-01-11 DIAGNOSIS — Z89512 Acquired absence of left leg below knee: Secondary | ICD-10-CM | POA: Diagnosis not present

## 2020-01-16 ENCOUNTER — Other Ambulatory Visit: Payer: Self-pay

## 2020-01-16 ENCOUNTER — Inpatient Hospital Stay (HOSPITAL_COMMUNITY): Payer: PPO

## 2020-01-16 ENCOUNTER — Inpatient Hospital Stay (HOSPITAL_COMMUNITY)
Admission: EM | Admit: 2020-01-16 | Discharge: 2020-01-16 | DRG: 639 | Discharge status: Against Medical Advice | Payer: PPO | Attending: Internal Medicine | Admitting: Internal Medicine

## 2020-01-16 ENCOUNTER — Emergency Department (HOSPITAL_COMMUNITY): Payer: PPO

## 2020-01-16 ENCOUNTER — Encounter (HOSPITAL_COMMUNITY): Payer: Self-pay

## 2020-01-16 DIAGNOSIS — I10 Essential (primary) hypertension: Secondary | ICD-10-CM | POA: Diagnosis present

## 2020-01-16 DIAGNOSIS — Z888 Allergy status to other drugs, medicaments and biological substances status: Secondary | ICD-10-CM

## 2020-01-16 DIAGNOSIS — Z86711 Personal history of pulmonary embolism: Secondary | ICD-10-CM | POA: Diagnosis not present

## 2020-01-16 DIAGNOSIS — J449 Chronic obstructive pulmonary disease, unspecified: Secondary | ICD-10-CM | POA: Diagnosis not present

## 2020-01-16 DIAGNOSIS — Z955 Presence of coronary angioplasty implant and graft: Secondary | ICD-10-CM

## 2020-01-16 DIAGNOSIS — Z801 Family history of malignant neoplasm of trachea, bronchus and lung: Secondary | ICD-10-CM

## 2020-01-16 DIAGNOSIS — Z5329 Procedure and treatment not carried out because of patient's decision for other reasons: Secondary | ICD-10-CM | POA: Diagnosis not present

## 2020-01-16 DIAGNOSIS — K8689 Other specified diseases of pancreas: Secondary | ICD-10-CM | POA: Diagnosis not present

## 2020-01-16 DIAGNOSIS — Z89519 Acquired absence of unspecified leg below knee: Secondary | ICD-10-CM

## 2020-01-16 DIAGNOSIS — Z794 Long term (current) use of insulin: Secondary | ICD-10-CM

## 2020-01-16 DIAGNOSIS — Z825 Family history of asthma and other chronic lower respiratory diseases: Secondary | ICD-10-CM | POA: Diagnosis not present

## 2020-01-16 DIAGNOSIS — Z89512 Acquired absence of left leg below knee: Secondary | ICD-10-CM

## 2020-01-16 DIAGNOSIS — Z833 Family history of diabetes mellitus: Secondary | ICD-10-CM

## 2020-01-16 DIAGNOSIS — E1169 Type 2 diabetes mellitus with other specified complication: Secondary | ICD-10-CM | POA: Diagnosis present

## 2020-01-16 DIAGNOSIS — Z20822 Contact with and (suspected) exposure to covid-19: Secondary | ICD-10-CM | POA: Diagnosis present

## 2020-01-16 DIAGNOSIS — R112 Nausea with vomiting, unspecified: Secondary | ICD-10-CM | POA: Diagnosis present

## 2020-01-16 DIAGNOSIS — I700 Atherosclerosis of aorta: Secondary | ICD-10-CM | POA: Diagnosis not present

## 2020-01-16 DIAGNOSIS — E1151 Type 2 diabetes mellitus with diabetic peripheral angiopathy without gangrene: Secondary | ICD-10-CM | POA: Diagnosis present

## 2020-01-16 DIAGNOSIS — I2510 Atherosclerotic heart disease of native coronary artery without angina pectoris: Secondary | ICD-10-CM

## 2020-01-16 DIAGNOSIS — R1084 Generalized abdominal pain: Secondary | ICD-10-CM | POA: Diagnosis not present

## 2020-01-16 DIAGNOSIS — E1142 Type 2 diabetes mellitus with diabetic polyneuropathy: Secondary | ICD-10-CM | POA: Diagnosis present

## 2020-01-16 DIAGNOSIS — K219 Gastro-esophageal reflux disease without esophagitis: Secondary | ICD-10-CM | POA: Diagnosis not present

## 2020-01-16 DIAGNOSIS — E785 Hyperlipidemia, unspecified: Secondary | ICD-10-CM | POA: Diagnosis present

## 2020-01-16 DIAGNOSIS — E1110 Type 2 diabetes mellitus with ketoacidosis without coma: Secondary | ICD-10-CM | POA: Diagnosis not present

## 2020-01-16 DIAGNOSIS — R000 Tachycardia, unspecified: Secondary | ICD-10-CM | POA: Diagnosis not present

## 2020-01-16 DIAGNOSIS — K3184 Gastroparesis: Secondary | ICD-10-CM | POA: Diagnosis not present

## 2020-01-16 DIAGNOSIS — E1010 Type 1 diabetes mellitus with ketoacidosis without coma: Secondary | ICD-10-CM

## 2020-01-16 DIAGNOSIS — Z79899 Other long term (current) drug therapy: Secondary | ICD-10-CM

## 2020-01-16 DIAGNOSIS — R52 Pain, unspecified: Secondary | ICD-10-CM | POA: Diagnosis not present

## 2020-01-16 DIAGNOSIS — D72829 Elevated white blood cell count, unspecified: Secondary | ICD-10-CM | POA: Diagnosis present

## 2020-01-16 DIAGNOSIS — M4814 Ankylosing hyperostosis [Forestier], thoracic region: Secondary | ICD-10-CM | POA: Diagnosis not present

## 2020-01-16 DIAGNOSIS — E860 Dehydration: Secondary | ICD-10-CM | POA: Diagnosis present

## 2020-01-16 DIAGNOSIS — G4733 Obstructive sleep apnea (adult) (pediatric): Secondary | ICD-10-CM | POA: Diagnosis not present

## 2020-01-16 DIAGNOSIS — I252 Old myocardial infarction: Secondary | ICD-10-CM

## 2020-01-16 DIAGNOSIS — Z823 Family history of stroke: Secondary | ICD-10-CM

## 2020-01-16 DIAGNOSIS — E1143 Type 2 diabetes mellitus with diabetic autonomic (poly)neuropathy: Secondary | ICD-10-CM | POA: Diagnosis present

## 2020-01-16 DIAGNOSIS — Z8249 Family history of ischemic heart disease and other diseases of the circulatory system: Secondary | ICD-10-CM

## 2020-01-16 DIAGNOSIS — E1165 Type 2 diabetes mellitus with hyperglycemia: Secondary | ICD-10-CM | POA: Diagnosis not present

## 2020-01-16 DIAGNOSIS — Z885 Allergy status to narcotic agent status: Secondary | ICD-10-CM

## 2020-01-16 DIAGNOSIS — Z7982 Long term (current) use of aspirin: Secondary | ICD-10-CM

## 2020-01-16 DIAGNOSIS — M47816 Spondylosis without myelopathy or radiculopathy, lumbar region: Secondary | ICD-10-CM | POA: Diagnosis not present

## 2020-01-16 DIAGNOSIS — Z7901 Long term (current) use of anticoagulants: Secondary | ICD-10-CM

## 2020-01-16 DIAGNOSIS — E119 Type 2 diabetes mellitus without complications: Secondary | ICD-10-CM

## 2020-01-16 LAB — CBC WITH DIFFERENTIAL/PLATELET
Abs Immature Granulocytes: 0.11 10*3/uL — ABNORMAL HIGH (ref 0.00–0.07)
Basophils Absolute: 0 10*3/uL (ref 0.0–0.1)
Basophils Relative: 0 %
Eosinophils Absolute: 0 10*3/uL (ref 0.0–0.5)
Eosinophils Relative: 0 %
HCT: 43.7 % (ref 39.0–52.0)
Hemoglobin: 14.6 g/dL (ref 13.0–17.0)
Immature Granulocytes: 1 %
Lymphocytes Relative: 5 %
Lymphs Abs: 0.9 10*3/uL (ref 0.7–4.0)
MCH: 30.3 pg (ref 26.0–34.0)
MCHC: 33.4 g/dL (ref 30.0–36.0)
MCV: 90.7 fL (ref 80.0–100.0)
Monocytes Absolute: 0.9 10*3/uL (ref 0.1–1.0)
Monocytes Relative: 5 %
Neutro Abs: 16.7 10*3/uL — ABNORMAL HIGH (ref 1.7–7.7)
Neutrophils Relative %: 89 %
Platelets: 340 10*3/uL (ref 150–400)
RBC: 4.82 MIL/uL (ref 4.22–5.81)
RDW: 13.1 % (ref 11.5–15.5)
WBC: 18.7 10*3/uL — ABNORMAL HIGH (ref 4.0–10.5)
nRBC: 0 % (ref 0.0–0.2)

## 2020-01-16 LAB — BETA-HYDROXYBUTYRIC ACID
Beta-Hydroxybutyric Acid: 6.44 mmol/L — ABNORMAL HIGH (ref 0.05–0.27)
Beta-Hydroxybutyric Acid: 1.45 mmol/L — ABNORMAL HIGH (ref 0.05–0.27)

## 2020-01-16 LAB — URINALYSIS, ROUTINE W REFLEX MICROSCOPIC
Bacteria, UA: NONE SEEN
Bilirubin Urine: NEGATIVE
Glucose, UA: >=500 mg/dL — AB
Ketones, ur: 80 mg/dL — AB
Leukocytes,Ua: NEGATIVE
Nitrite: NEGATIVE
Protein, ur: 100 mg/dL — AB
Specific Gravity, Urine: 1.024 (ref 1.005–1.030)
pH: 5 (ref 5.0–8.0)

## 2020-01-16 LAB — CBG MONITORING, ED
Glucose-Capillary: 454 mg/dL — ABNORMAL HIGH (ref 70–99)
Glucose-Capillary: 402 mg/dL — ABNORMAL HIGH (ref 70–99)
Glucose-Capillary: 264 mg/dL — ABNORMAL HIGH (ref 70–99)
Glucose-Capillary: 261 mg/dL — ABNORMAL HIGH (ref 70–99)
Glucose-Capillary: 243 mg/dL — ABNORMAL HIGH (ref 70–99)
Glucose-Capillary: 217 mg/dL — ABNORMAL HIGH (ref 70–99)
Glucose-Capillary: 172 mg/dL — ABNORMAL HIGH (ref 70–99)

## 2020-01-16 LAB — COMPREHENSIVE METABOLIC PANEL
ALT: 18 U/L (ref 0–44)
AST: 22 U/L (ref 15–41)
Albumin: 3.9 g/dL (ref 3.5–5.0)
Alkaline Phosphatase: 74 U/L (ref 38–126)
Anion gap: 24 — ABNORMAL HIGH (ref 5–15)
BUN: 23 mg/dL (ref 8–23)
CO2: 18 mmol/L — ABNORMAL LOW (ref 22–32)
Calcium: 9.5 mg/dL (ref 8.9–10.3)
Chloride: 95 mmol/L — ABNORMAL LOW (ref 98–111)
Creatinine, Ser: 1.19 mg/dL (ref 0.61–1.24)
GFR calc Af Amer: >60 mL/min (ref >60)
GFR calc non Af Amer: >60 mL/min (ref >60)
Glucose, Bld: 432 mg/dL — ABNORMAL HIGH (ref 70–99)
Potassium: 4.3 mmol/L (ref 3.5–5.1)
Sodium: 137 mmol/L (ref 135–145)
Total Bilirubin: 1.9 mg/dL — ABNORMAL HIGH (ref 0.3–1.2)
Total Protein: 8.3 g/dL — ABNORMAL HIGH (ref 6.5–8.1)

## 2020-01-16 LAB — BASIC METABOLIC PANEL
Anion gap: 15 (ref 5–15)
BUN: 23 mg/dL (ref 8–23)
CO2: 22 mmol/L (ref 22–32)
Calcium: 8.8 mg/dL — ABNORMAL LOW (ref 8.9–10.3)
Chloride: 103 mmol/L (ref 98–111)
Creatinine, Ser: 1.11 mg/dL (ref 0.61–1.24)
GFR calc Af Amer: >60 mL/min (ref >60)
GFR calc non Af Amer: >60 mL/min (ref >60)
Glucose, Bld: 283 mg/dL — ABNORMAL HIGH (ref 70–99)
Potassium: 4.4 mmol/L (ref 3.5–5.1)
Sodium: 140 mmol/L (ref 135–145)

## 2020-01-16 LAB — BLOOD GAS, VENOUS
Acid-base deficit: 6 mmol/L — ABNORMAL HIGH (ref 0.0–2.0)
Bicarbonate: 17.4 mmol/L — ABNORMAL LOW (ref 20.0–28.0)
FIO2: 21
O2 Saturation: 93.3 %
Patient temperature: 98.6
pCO2, Ven: 29.7 mmHg — ABNORMAL LOW (ref 44.0–60.0)
pH, Ven: 7.386 (ref 7.250–7.430)
pO2, Ven: 71.9 mmHg — ABNORMAL HIGH (ref 32.0–45.0)

## 2020-01-16 LAB — LIPASE, BLOOD: Lipase: 17 U/L (ref 11–51)

## 2020-01-16 LAB — SARS CORONAVIRUS 2 BY RT PCR (HOSPITAL ORDER, PERFORMED IN ~~LOC~~ HOSPITAL LAB): SARS Coronavirus 2: NEGATIVE

## 2020-01-16 LAB — MAGNESIUM: Magnesium: 1.5 mg/dL — ABNORMAL LOW (ref 1.7–2.4)

## 2020-01-16 LAB — HEMOGLOBIN A1C
Hgb A1c MFr Bld: 8.5 % — ABNORMAL HIGH (ref 4.8–5.6)
Mean Plasma Glucose: 197.25 mg/dL

## 2020-01-16 MED ORDER — MAGNESIUM SULFATE IN D5W 1-5 GM/100ML-% IV SOLN
1.0000 g | Freq: Once | INTRAVENOUS | Status: DC
  Filled 2020-01-16: qty 100

## 2020-01-16 MED ORDER — DEXTROSE-NACL 5-0.45 % IV SOLN: INTRAVENOUS | Status: DC

## 2020-01-16 MED ORDER — GABAPENTIN 300 MG PO CAPS: 600.0000 mg | ORAL_CAPSULE | Freq: Two times a day (BID) | ORAL | Status: DC

## 2020-01-16 MED ORDER — IOHEXOL 300 MG/ML  SOLN
100.0000 mL | Freq: Once | INTRAMUSCULAR | Status: AC | PRN
  Administered 2020-01-16: 100 mL via INTRAVENOUS

## 2020-01-16 MED ORDER — RANOLAZINE ER 500 MG PO TB12
500.0000 mg | ORAL_TABLET | Freq: Two times a day (BID) | ORAL | Status: DC
  Filled 2020-01-16: qty 1

## 2020-01-16 MED ORDER — ENOXAPARIN SODIUM 100 MG/ML ~~LOC~~ SOLN
100.0000 mg | Freq: Two times a day (BID) | SUBCUTANEOUS | Status: DC
  Filled 2020-01-16: qty 1

## 2020-01-16 MED ORDER — DEXTROSE 50 % IV SOLN: 0.0000 mL | INTRAVENOUS | Status: DC | PRN

## 2020-01-16 MED ORDER — SODIUM CHLORIDE 0.9 % IV SOLN: INTRAVENOUS | Status: DC

## 2020-01-16 MED ORDER — NITROGLYCERIN 0.4 MG SL SUBL: 0.4000 mg | SUBLINGUAL_TABLET | SUBLINGUAL | Status: DC | PRN

## 2020-01-16 MED ORDER — METOCLOPRAMIDE HCL 5 MG/ML IJ SOLN: 10.0000 mg | Freq: Four times a day (QID) | INTRAMUSCULAR | Status: DC | PRN

## 2020-01-16 MED ORDER — TRAMADOL HCL 50 MG PO TABS
100.0000 mg | ORAL_TABLET | Freq: Four times a day (QID) | ORAL | Status: DC | PRN
  Administered 2020-01-16: 100 mg via ORAL
  Filled 2020-01-16: qty 2

## 2020-01-16 MED ORDER — INSULIN REGULAR(HUMAN) IN NACL 100-0.9 UT/100ML-% IV SOLN: INTRAVENOUS | Status: DC

## 2020-01-16 MED ORDER — PROMETHAZINE HCL 25 MG PO TABS: 25.0000 mg | ORAL_TABLET | Freq: Four times a day (QID) | ORAL | Status: DC | PRN

## 2020-01-16 MED ORDER — AMLODIPINE BESYLATE 5 MG PO TABS
10.0000 mg | ORAL_TABLET | Freq: Every day | ORAL | Status: DC
  Administered 2020-01-16: 10 mg via ORAL
  Filled 2020-01-16: qty 2

## 2020-01-16 MED ORDER — SODIUM CHLORIDE (PF) 0.9 % IJ SOLN
INTRAMUSCULAR | Status: AC
  Filled 2020-01-16: qty 50

## 2020-01-16 MED ORDER — ASPIRIN EC 81 MG PO TBEC: 81.0000 mg | DELAYED_RELEASE_TABLET | Freq: Every day | ORAL | Status: DC

## 2020-01-16 MED ORDER — MAGNESIUM SULFATE IN D5W 1-5 GM/100ML-% IV SOLN
1.0000 g | Freq: Once | INTRAVENOUS | Status: AC
  Administered 2020-01-16: 1 g via INTRAVENOUS
  Filled 2020-01-16: qty 100

## 2020-01-16 MED ORDER — INSULIN ASPART 100 UNIT/ML ~~LOC~~ SOLN
0.0000 [IU] | Freq: Every day | SUBCUTANEOUS | Status: DC
  Filled 2020-01-16: qty 0.05

## 2020-01-16 MED ORDER — SODIUM CHLORIDE 0.9 % IV BOLUS
1000.0000 mL | INTRAVENOUS | Status: AC
  Administered 2020-01-16: 1000 mL via INTRAVENOUS

## 2020-01-16 MED ORDER — MORPHINE SULFATE (PF) 4 MG/ML IV SOLN
6.0000 mg | Freq: Once | INTRAVENOUS | Status: AC
  Administered 2020-01-16: 6 mg via INTRAVENOUS
  Filled 2020-01-16: qty 2

## 2020-01-16 MED ORDER — ONDANSETRON HCL 4 MG/2ML IJ SOLN: 4.0000 mg | Freq: Four times a day (QID) | INTRAMUSCULAR | Status: DC | PRN

## 2020-01-16 MED ORDER — INSULIN REGULAR(HUMAN) IN NACL 100-0.9 UT/100ML-% IV SOLN
INTRAVENOUS | Status: DC
  Administered 2020-01-16: 15 [IU]/h via INTRAVENOUS
  Filled 2020-01-16: qty 100

## 2020-01-16 MED ORDER — METOPROLOL TARTRATE 25 MG PO TABS
25.0000 mg | ORAL_TABLET | Freq: Two times a day (BID) | ORAL | Status: DC
  Administered 2020-01-16: 25 mg via ORAL
  Filled 2020-01-16: qty 1

## 2020-01-16 MED ORDER — SODIUM CHLORIDE 0.9 % IV BOLUS
1000.0000 mL | Freq: Once | INTRAVENOUS | Status: AC
  Administered 2020-01-16: 1000 mL via INTRAVENOUS

## 2020-01-16 MED ORDER — METOCLOPRAMIDE HCL 5 MG/ML IJ SOLN
10.0000 mg | Freq: Once | INTRAMUSCULAR | Status: AC
  Administered 2020-01-16: 10 mg via INTRAVENOUS
  Filled 2020-01-16: qty 2

## 2020-01-16 MED ORDER — MAGNESIUM SULFATE 50 % IJ SOLN: 1.0000 g | Freq: Once | INTRAMUSCULAR | Status: DC

## 2020-01-16 MED ORDER — PANTOPRAZOLE SODIUM 40 MG IV SOLR
40.0000 mg | INTRAVENOUS | Status: DC
  Administered 2020-01-16: 40 mg via INTRAVENOUS
  Filled 2020-01-16: qty 40

## 2020-01-16 MED ORDER — INSULIN DETEMIR 100 UNIT/ML ~~LOC~~ SOLN
40.0000 [IU] | Freq: Every day | SUBCUTANEOUS | Status: DC
  Filled 2020-01-16 (×2): qty 0.4

## 2020-01-16 MED ORDER — ONDANSETRON HCL 4 MG/2ML IJ SOLN
4.0000 mg | Freq: Once | INTRAMUSCULAR | Status: AC
  Administered 2020-01-16: 4 mg via INTRAVENOUS
  Filled 2020-01-16: qty 2

## 2020-01-16 MED ORDER — POTASSIUM CHLORIDE 10 MEQ/100ML IV SOLN
10.0000 meq | INTRAVENOUS | Status: AC
  Administered 2020-01-16 (×2): 10 meq via INTRAVENOUS
  Filled 2020-01-16 (×2): qty 100

## 2020-01-16 MED ORDER — INSULIN ASPART 100 UNIT/ML ~~LOC~~ SOLN
0.0000 [IU] | Freq: Three times a day (TID) | SUBCUTANEOUS | Status: DC
  Filled 2020-01-16: qty 0.15

## 2020-01-16 MED ORDER — CLOPIDOGREL BISULFATE 75 MG PO TABS
75.0000 mg | ORAL_TABLET | Freq: Every day | ORAL | Status: DC
  Administered 2020-01-16: 75 mg via ORAL
  Filled 2020-01-16: qty 1

## 2020-01-16 MED ORDER — ATORVASTATIN CALCIUM 40 MG PO TABS
80.0000 mg | ORAL_TABLET | Freq: Every day | ORAL | Status: DC
  Administered 2020-01-16: 80 mg via ORAL
  Filled 2020-01-16: qty 2

## 2020-01-16 NOTE — ED Provider Notes (Signed)
[facility]-EMERGENCY DEPT Provider Note   CSN: [id number] Arrival date & time: 01/16/20  3875     History No chief complaint on file.   Samuel [name] is a 69 y.o. male.  The history is provided by the patient and medical records. No language interpreter was used.     69 year old male with history of PE currently on Eliquis, insulin-dependent diabetes, CAD, GERD, anxiety brought here via via EMS from home for evaluation of nausea vomiting diarrhea.  Patient report for the past 2 days he has had persistent nausea, has vomited multiple episodes of nonbloody nonbilious vomiting as well as having persistent loose stools.  He also endorsed diffuse abdominal pain and described as sharp and soreness related to persistent vomiting.  Pain is 10 out of 10 and have been persistent.  No associated fever chills.  No significant chest pain or shortness of breath and denies any dysuria.  He denies any recent antibiotic use as well as any recent travel or eating exotic food.  Denies any recent sick contact.  He has had his Covid vaccination.  He did receive Zofran prior to arrival but still endorse significant nausea.  Denies any recent sickness.  Does have history of diabetes  Past Medical History:  Diagnosis Date  . Anxiety   . Arthritis    "all over"   . CAD (coronary artery disease)   . Charcot's joint    "left foot"  . 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 Depression   . Gastroparesis   . GERD (gastroesophageal reflux disease)   . H/O hiatal hernia   . Hyperlipidemia   . Hypertension   . Myocardial infarction (HCC) 2017/03/27   around this date  . OSA (obstructive sleep apnea)    "not bad enough for a mask"  . Peripheral neuropathy   . Peripheral vascular disease (HCC)   . PONV (postoperative nausea and vomiting)   . Pulmonary embolism (HCC)    hx. of 2012  . Shortness of breath    exertion  . Type II diabetes mellitus [facility])     Patient  Active Problem List   Diagnosis Date Noted  . Hyperglycemia 09/23/2019  . DVT (deep venous thrombosis) (HCC) 06/09/2019  . DKA (diabetic ketoacidoses) (HCC) 10/01/2018  . Gastroparesis 08/17/2018  . Chest pain 08/16/2018  . DKA, type 2 (HCC) 02/03/2018  . DOE (dyspnea on exertion) 01/24/2018  . Uncontrolled diabetes mellitus (HCC) 09/23/2017  . DKA, type 2, not at goal [facility]) 09/22/2017  . S/P BKA (below knee amputation) (HCC) 04/19/2017  . COPD (chronic obstructive pulmonary disease) (HCC) 04/19/2017  . NSTEMI (non-ST elevated myocardial infarction) (HCC) 04/07/2017  . Osteomyelitis of foot (HCC)   . Protein-calorie malnutrition, severe (HCC) 03/08/2015  . Osteomyelitis of foot, acute (HCC) 03/07/2015  . Diabetes mellitus type 2, uncontrolled (HCC) 03/07/2015  . Normocytic normochromic anemia 03/07/2015  . Obesity (BMI 30-39.9) 03/07/2015  . Nausea & vomiting   . DM (diabetes mellitus), type 2 (HCC) 01/07/2014  . Chronic osteomyelitis involving lower leg (HCC) 01/07/2014  . Vomiting 12/22/2013  . Diabetic gastroparesis (HCC) 12/21/2013  . Intractable nausea and vomiting 12/21/2013  . Essential hypertension 05/01/2013  . Hyperlipidemia due to type 2 diabetes mellitus (HCC) 05/01/2013  . Venous stasis of lower extremity 04/15/2013  . Generalized weakness 03/20/2013  . Back pain 03/20/2013  . Pulmonary embolism (HCC) 03/27/2011  . PERFORATION OF GALLBLADDER 08/30/2010  . Ulcer of lower limb, unspecified 03/01/2010  .  METHICILLIN SUSCEPTIBLE STAPH AUREUS SEPTICEMIA 01/25/2010  . Acute osteomyelitis, ankle and foot 01/25/2010  . NAUSEA AND VOMITING 01/25/2010  . GERD 11/09/2009  . CAD S/P DES PCI to mLAD: Xience DES 2.75 x 15 (3.0 mm) 05/01/2009  . DIABETES, TYPE 1 05/14/2008  . OBSTRUCTIVE SLEEP APNEA 04/30/2008  . ALLERGIC RHINITIS, SEASONAL 04/30/2008    Past Surgical History:  Procedure Laterality Date  . AMPUTATION Left 03/08/2015   Procedure:  LEFT AMPUTATION BELOW  KNEE;  Surgeon: Toni Arthurs, MD;  Location: [facility] ORS;  Service: Orthopedics;  Laterality: Left;  . APPLICATION OF WOUND VAC Left 01/07/2014  . CHOLECYSTECTOMY  06/2010  . CORONARY ANGIOPLASTY WITH STENT PLACEMENT  05/2009   "1"  . FOOT SURGERY Left 2010   "for Charcot's joint"  . HARDWARE REMOVAL Left 01/07/2014   Procedure: LEFT LEG REMOVAL OF DEEP IMPLANT AND SEQUESTRECTOMY; APPLICATION OF WOUND VAC ;  Surgeon: Toni Arthurs, MD;  Location: [facility] OR;  Service: Orthopedics;  Laterality: Left;  . I & D EXTREMITY Left "multiple"   leg  . 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 IM NAILING TIBIA Left ~ 2012  . LEFT HEART CATH AND CORONARY ANGIOGRAPHY N/A 04/08/2017   Procedure: LEFT HEART CATH AND CORONARY ANGIOGRAPHY;  Surgeon: Runell Gess, MD;  Location: [facility] INVASIVE CV LAB;  Service: Cardiovascular;  Laterality: N/A;  . SHOULDER ARTHROSCOPY W/ ROTATOR CUFF REPAIR Left    "and bone spurs"  . TIBIAL IM ROD REMOVAL Left 01/07/2014  . TOE AMPUTATION Right ~ 2011   "great toe"  . VENA CAVA FILTER PLACEMENT  2012  . VENA CAVA FILTER PLACEMENT N/A 11/20/2016   Procedure: INSERTION VENA-CAVA FILTER;  Surgeon: Sherren Kerns, MD;  Location: [facility] OR;  Service: Vascular;  Laterality: N/A;  . WOUND DEBRIDEMENT Left 01/07/2014   "tibia"       Family History  Problem Relation Age of Onset  . COPD Mother   . Heart disease Mother   . Lung cancer Mother   . Retinal detachment Father   . Alcoholism Brother   . Heart disease Brother   . COPD Brother   . Diabetes Brother   . Hypertension Brother   . Stroke Brother     Social History   Tobacco Use  . Smoking status: Never Smoker  . Smokeless tobacco: Never Used  Vaping Use  . Vaping Use: Never used  Substance Use Topics  . Alcohol use: No    Comment: 01/07/2014 'might have a wine cooler a couple  times/yr"  . Drug use: No    Home Medications Prior to Admission medications   Medication Sig Start Date End Date Taking? Authorizing Provider  alum & mag hydroxide-simeth (MAALOX/MYLANTA) 200-200-20 MG/5ML suspension Take 15 mLs by mouth every 6 (six) hours as needed for indigestion or heartburn. 09/25/19   Marguerita Merles Latif, DO  amLODipine (NORVASC) 10 MG tablet Take 1 tablet (10 mg total) by mouth daily. 11/22/16   Richarda Overlie, MD  apixaban (ELIQUIS) 5 MG TABS tablet Take 5 mg by mouth 2 (two) times daily.    [provider]  aspirin 81 MG EC tablet Take 1 tablet (81 mg total) by mouth daily. 04/12/17   Glade Lloyd, MD  atorvastatin (LIPITOR) 80 MG tablet Take 1 tablet (80 mg total) by mouth daily at 6 PM. 04/11/17  Glade Lloyd, MD  docusate sodium (COLACE) 100 MG capsule Take 100 mg by mouth daily.    [provider]  ferrous sulfate 325 (65 FE) MG tablet Take 325 mg by mouth every evening.     [provider]  furosemide (LASIX) 20 MG tablet Take 1 tablet (20 mg total) by mouth daily. Patient taking differently: Take 20 mg by mouth daily as needed for fluid or edema.  04/12/17   Glade Lloyd, MD  gabapentin (NEURONTIN) 300 MG capsule Take 600 mg by mouth 2 (two) times daily.     [provider]  Insulin Glargine (BASAGLAR KWIKPEN) 100 UNIT/ML Inject 0.55 mLs (55 Units total) into the skin 2 (two) times daily. 09/25/19   Sheikh, Omair Latif, DO  insulin lispro (HUMALOG) 100 UNIT/ML injection Inject 5-18 Units into the skin See admin instructions. units three times daily before meals Sliding Scale 150-250 = 5 units 251-350= 6-8 units 351-450= 10 units Over 450 18 units    Averneni, Madhavi, MD  metFORMIN (GLUCOPHAGE-XR) 500 MG 24 hr tablet Take 500 mg by mouth daily.    [provider]  metoprolol tartrate (LOPRESSOR) 25 MG tablet Take 1 tablet (25 mg total) by mouth 2 (two) times daily. 04/11/17   Glade Lloyd, MD  nitroGLYCERIN  (NITROSTAT) 0.4 MG SL tablet Place 1 tablet (0.4 mg total) under the tongue every 5 (five) minutes as needed for chest pain. 04/11/17   Glade Lloyd, MD  [facility] VERIO test strip Use to test blood sugar twice daily 09/24/17   [provider]  pantoprazole (PROTONIX) 40 MG tablet Take 1 tablet (40 mg total) by mouth 2 (two) times daily before a meal. 08/21/18   Mikhail, Nita Sells, DO  promethazine (PHENERGAN) 25 MG tablet Take 25-50 mg by mouth every 6 (six) hours as needed for nausea or vomiting.  05/05/18   [provider]  ranolazine (RANEXA) 500 MG 12 hr tablet Take 1 tablet (500 mg total) by mouth 2 (two) times daily. 04/11/17   Glade Lloyd, MD  traMADol (ULTRAM) 50 MG tablet Take 100 mg by mouth every 6 (six) hours as needed for moderate pain.     [provider]    Allergies    Gabapentin, Relafen [nabumetone], and Codeine  Review of Systems   Review of Systems  All other systems reviewed and are negative.   Physical Exam Updated Vital Signs There were no vitals taken for this visit.  Physical Exam Vitals and nursing note reviewed.  Constitutional:      Appearance: He is well-developed. He is obese.     Comments: Ill-appearing male breathing heavily and appears uncomfortable.  HENT:     Head: Atraumatic.     Mouth/Throat:     Mouth: Mucous membranes are dry.  Eyes:     Conjunctiva/sclera: Conjunctivae normal.  Cardiovascular:     Rate and Rhythm: Tachycardia present.     Pulses: Normal pulses.     Heart sounds: Normal heart sounds.  Pulmonary:     Comments: Tachypneic Abdominal:     Palpations: Abdomen is soft.     Tenderness: There is abdominal tenderness (Diffuse abdominal tenderness without guarding rebound tenderness.).  Musculoskeletal:     Cervical back: Normal range of motion and neck supple.  Skin:    Findings: No rash.     Comments: Dressing noted to right lower extremity.  Several small scabs noted to the anterior tib-fib without  signs of infection.  Neurological:  Mental Status: He is alert. Mental status is at baseline.  Psychiatric:        Mood and Affect: Mood normal.     ED Results / Procedures / Treatments   Labs (all labs ordered are listed, but only abnormal results are displayed) Labs Reviewed  CBC WITH DIFFERENTIAL/PLATELET - Abnormal; Notable for the following components:      Result Value   WBC 18.7 (*)    Neutro Abs 16.7 (*)    Abs Immature Granulocytes 0.11 (*)    All other components within normal limits  COMPREHENSIVE METABOLIC PANEL - Abnormal; Notable for the following components:   Chloride 95 (*)    CO2 18 (*)    Glucose, Bld 432 (*)    Total Protein 8.3 (*)    Total Bilirubin 1.9 (*)    Anion gap 24 (*)    All other components within normal limits  URINALYSIS, ROUTINE W REFLEX MICROSCOPIC - Abnormal; Notable for the following components:   Color, Urine STRAW (*)    Glucose, UA >=500 (*)    Hgb urine dipstick SMALL (*)    Ketones, ur 80 (*)    Protein, ur 100 (*)    All other components within normal limits  BLOOD GAS, VENOUS - Abnormal; Notable for the following components:   pCO2, Ven 29.7 (*)    pO2, Ven 71.9 (*)    Bicarbonate 17.4 (*)    Acid-base deficit 6.0 (*)    All other components within normal limits  MAGNESIUM - Abnormal; Notable for the following components:   Magnesium 1.5 (*)    All other components within normal limits  BETA-HYDROXYBUTYRIC ACID - Abnormal; Notable for the following components:   Beta-Hydroxybutyric Acid 6.44 (*)    All other components within normal limits  CBG MONITORING, ED - Abnormal; Notable for the following components:   Glucose-Capillary 454 (*)    All other components within normal limits  CBG MONITORING, ED - Abnormal; Notable for the following components:   Glucose-Capillary 402 (*)    All other components within normal limits  SARS CORONAVIRUS 2 BY RT PCR (HOSPITAL ORDER, PERFORMED IN [facility])  LIPASE,  BLOOD  BETA-HYDROXYBUTYRIC ACID    EKG EKG Interpretation  Date/Time:  Saturday January 16 2020 10:19:59 EDT Ventricular Rate:  102 PR Interval:    QRS Duration: 111 QT Interval:  380 QTC Calculation: 495 R Axis:   26 Text Interpretation: Sinus tachycardia Low voltage, precordial leads Borderline prolonged QT interval Nonspecific ST abnormality No significant change since last tracing Confirmed by Cathren Laine ([id number]) on 01/16/2020 11:32:27 AM   Radiology No results found.  Procedures .Critical Care Performed by: Fayrene Helper, PA-C Authorized by: Fayrene Helper, PA-C   Critical care provider statement:    Critical care time (minutes):  30   Critical care was time spent personally by me on the following activities:  Discussions with consultants, evaluation of patient's response to treatment, examination of patient, ordering and performing treatments and interventions, ordering and review of laboratory studies, ordering and review of radiographic studies, pulse oximetry, re-evaluation of patient's condition, obtaining history from patient or surrogate and review of old charts   (including critical care time)  Medications Ordered in ED Medications  sodium chloride (PF) 0.9 % injection (has no administration in time range)  insulin regular, human (MYXREDLIN) 100 units/ 100 mL infusion (15 Units/hr Intravenous Rate/Dose Change 01/16/20 1305)  0.9 %  sodium chloride infusion (has no administration in time range)  dextrose 5 %-0.45 % sodium chloride infusion (0 mLs Intravenous Hold 01/16/20 1140)  dextrose 50 % solution 0-50 mL (has no administration in time range)  sodium chloride 0.9 % bolus 1,000 mL (1,000 mLs Intravenous New Bag/Given 01/16/20 1139)  potassium chloride 10 mEq in 100 mL IVPB (10 mEq Intravenous New Bag/Given 01/16/20 1258)  magnesium sulfate IVPB 1 g 100 mL (has no administration in time range)  metoCLOPramide (REGLAN) injection 10 mg (has no administration in time range)    ondansetron (ZOFRAN) injection 4 mg (4 mg Intravenous Given 01/16/20 1024)  morphine 4 MG/ML injection 6 mg (6 mg Intravenous Given 01/16/20 1024)  sodium chloride 0.9 % bolus 1,000 mL (1,000 mLs Intravenous New Bag/Given 01/16/20 1027)  iohexol (OMNIPAQUE) 300 MG/ML solution 100 mL (100 mLs Intravenous Contrast Given 01/16/20 1121)    ED Course  I have reviewed the triage vital signs and the nursing notes.  Pertinent labs & imaging results that were available during my care of the patient were reviewed by me and considered in my medical decision making (see chart for details).    MDM Rules/Calculators/A&P                          BP (!) 161/77 (BP Location: Right Arm)   Pulse (!) 107   Temp 98.2 F (36.8 C) (Oral)   Resp (!) 26   SpO2 95%   Final Clinical Impression(s) / ED Diagnoses Final diagnoses:  Diabetic ketoacidosis without coma associated with type 2 diabetes mellitus (HCC)    Rx / DC Orders ED Discharge Orders    None     10:14 AM Patient with history of diabetes here with complaints of abdominal pain nausea vomiting diarrhea.  He appears uncomfortable, concern for potential DKA.  Work-up initiated.  Due to age and diffuse abdominal tenderness, will obtain abdominal pelvis CT scan as well.  11:18 AM CBG is 454, patient has an anion gap of 24, bicarb is 18, and elevated white count of 18.7.  Urine is currently pending.  Will initiate Endo tool for glucose stabilizer protocol.  Care discussed with Dr. Denton LankSteinl.    12:28 PM Patient is still nauseous, will provide additional antinausea medication.  Appreciate consultation from [facility] hospitalist, Dr. Lanae Boastamesh KC who agrees to see and admit patient for further management of his DKA.  COVID-19 screening test have been ordered.  Abdominal pelvis CT scan have not resulted yet.  Will monitor closely.  Mild hypomagnesemia at 1.5.  Will give supplementation.  pH is 7.396.   Fayrene Helperran, Evadene Wardrip, PA-C 01/16/20 1318    Cathren LaineSteinl, Kevin,  MD 01/16/20 [phone]

## 2020-01-16 NOTE — ED Notes (Addendum)
Pt is requesting to go home. Pt is alert and aware of his and condition. MD notified. Pt wants to sign out AMA and sts he will come back to ER if he needs to

## 2020-01-16 NOTE — Progress Notes (Signed)
Pharmacy Note  69 y/o M with a h/o CAD and PE on Eliquis PTA admitted with DKA and unable to take PO due to N/V. Pharmacy consulted to bridge with Lovenox until able to take PO.   O: SCr 1.19 CBC wnl  A/P: Renal function is adequate for bid dosing. Will initiate Lovenox 1 mg/kg q 12 hours. Pharmacy will sign off and follow remotely. Thank you for the consult.   Luisa Hart, PharmD, BCPS

## 2020-01-16 NOTE — ED Triage Notes (Signed)
Patient coming from home with complain of nausea, vomiting, and diarrhea started last night. Patient also complain of abdominal rating it 10/10. Patient given Zofran 4 mg IM per ems.

## 2020-01-16 NOTE — H&P (Signed)
History and Physical    Samuel [name] PPI:[id number] DOB: 06/24/1950 DOA: 01/16/2020  PCP: [name], MD   Patient coming from: Home  Chief complaint: Nausea vomiting and diffuse abdominal pain    HPI: Samuel [name] is a 69 y.o. male with medical history significant for history of CAD status post stent and PE in 2018 on Eliquis, GERD, hypertension, hyperlipidemia, OSA, diabetes mellitus on long-term insulin and with diabetic gastroparesis, peripheral neuropathy, PVD comes to the ED with complaint of nausea vomiting abdominal pain and diarrhea for last 2 days.  Patient's reports persistent nausea vomiting diarrhea nonbloody nonbilious vomiting along with loose stool nonbloody, and diffuse abdominal pain.  He denies any fever, chest pain, shortness of breath, headache, focal weakness. He reports his blood sugar has been fluctuating anywhere from 30s to 300.  ED Course: Hypertensive, tachycardic 110s, blood work showed blood gas pH 7.3, lab with anion gap 24 blood sugar 432 bicarb 18, magnesium 1.5, total bili 1.9, leukocytosis 18.7K, UA with more than 500 glucose, ketones 80,WBC 25,lfts,lipase normal.  Patient given morphine, Zofran, normal saline bolus 1 L.  Patient is a started on insulin infusion and admission was requested for further management. When I saw the patient he was still complaining of nausea otherwise no new complaints.  He appears mildly tachycardic and hypertensive.  Review of Systems: All systems were reviewed and were negative except as mentioned in HPI above. Negative for fever Negative for chest pain Negative for shortness of breath  Past Medical History:  Diagnosis Date  . Anxiety   . Arthritis    "all over"   . CAD (coronary artery disease)   . Charcot's joint    "left foot"  . 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 Depression   . Gastroparesis   . GERD (gastroesophageal reflux disease)   . H/O hiatal hernia   . Hyperlipidemia   .  Hypertension   . Myocardial infarction (HCC) 2017/03/27   around this date  . OSA (obstructive sleep apnea)    "not bad enough for a mask"  . Peripheral neuropathy   . Peripheral vascular disease (HCC)   . PONV (postoperative nausea and vomiting)   . Pulmonary embolism (HCC)    hx. of 2012  . Shortness of breath    exertion  . Type II diabetes mellitus (HCC)     Past Surgical History:  Procedure Laterality Date  . AMPUTATION Left 03/08/2015   Procedure:  LEFT AMPUTATION BELOW KNEE;  Surgeon: Toni Arthurs, MD;  Location: [facility];  Service: Orthopedics;  Laterality: Left;  . APPLICATION OF WOUND VAC Left 01/07/2014  . CHOLECYSTECTOMY  06/2010  . CORONARY ANGIOPLASTY WITH STENT PLACEMENT  05/2009   "1"  . FOOT SURGERY Left 2010   "for Charcot's joint"  . HARDWARE REMOVAL Left 01/07/2014   Procedure: LEFT LEG REMOVAL OF DEEP IMPLANT AND SEQUESTRECTOMY; APPLICATION OF WOUND VAC ;  Surgeon: Toni Arthurs, MD;  Location: [facility] OR;  Service: Orthopedics;  Laterality: Left;  . I & D EXTREMITY Left "multiple"   leg  . 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 IM NAILING TIBIA Left ~ 2012  . LEFT HEART CATH AND CORONARY ANGIOGRAPHY N/A 04/08/2017   Procedure: LEFT HEART CATH AND CORONARY ANGIOGRAPHY;  Surgeon: Runell Gess, MD;  Location: [facility] INVASIVE  CV LAB;  Service: Cardiovascular;  Laterality: N/A;  . SHOULDER ARTHROSCOPY W/ ROTATOR CUFF REPAIR Left    "and bone spurs"  . TIBIAL IM ROD REMOVAL Left 01/07/2014  . TOE AMPUTATION Right ~ 2011   "great toe"  . VENA CAVA FILTER PLACEMENT  2012  . VENA CAVA FILTER PLACEMENT N/A 11/20/2016   Procedure: INSERTION VENA-CAVA FILTER;  Surgeon: Sherren KernsFields, Charles E, MD;  [facility];  Service: Vascular;  Laterality: N/A;  . WOUND DEBRIDEMENT Left 01/07/2014   "tibia"     reports that he has never smoked. He  has never used smokeless tobacco. He reports that he does not drink alcohol and does not use drugs.  Allergies  Allergen Reactions  . Gabapentin Hives, Itching, Nausea And Vomiting and Rash    REACTION: rash/hives (per patient, was a reaction to an inactive ingredient in another mgf brand). The patient stated that he does take this medication now and it doesn't cause a rash any more.  . Relafen [Nabumetone] Nausea And Vomiting and Rash  . Codeine Nausea And Vomiting and Rash    Family History  Problem Relation Age of Onset  . COPD Mother   . Heart disease Mother   . Lung cancer Mother   . Retinal detachment Father   . Alcoholism Brother   . Heart disease Brother   . COPD Brother   . Diabetes Brother   . Hypertension Brother   . Stroke Brother      Prior to Admission medications   Medication Sig Start Date End Date Taking? Authorizing Provider  alum & mag hydroxide-simeth (MAALOX/MYLANTA) 200-200-20 MG/5ML suspension Take 15 mLs by mouth every 6 (six) hours as needed for indigestion or heartburn. 09/25/19   Marguerita MerlesSheikh, Omair Latif, DO  amLODipine (NORVASC) 10 MG tablet Take 1 tablet (10 mg total) by mouth daily. 11/22/16   Richarda OverlieAbrol, Nayana, MD  apixaban (ELIQUIS) 5 MG TABS tablet Take 5 mg by mouth 2 (two) times daily.    [provider]  aspirin 81 MG EC tablet Take 1 tablet (81 mg total) by mouth daily. 04/12/17   Glade LloydAlekh, Kshitiz, MD  atorvastatin (LIPITOR) 80 MG tablet Take 1 tablet (80 mg total) by mouth daily at 6 PM. 04/11/17   Glade LloydAlekh, Kshitiz, MD  docusate sodium (COLACE) 100 MG capsule Take 100 mg by mouth daily.    [provider]  ferrous sulfate 325 (65 FE) MG tablet Take 325 mg by mouth every evening.     [provider]  furosemide (LASIX) 20 MG tablet Take 1 tablet (20 mg total) by mouth daily. Patient taking differently: Take 20 mg by mouth daily as needed for fluid or edema.  04/12/17   Glade LloydAlekh, Kshitiz, MD  gabapentin (NEURONTIN) 300 MG capsule Take 600  mg by mouth 2 (two) times daily.     [provider]  Insulin Glargine (BASAGLAR KWIKPEN) 100 UNIT/ML Inject 0.55 mLs (55 Units total) into the skin 2 (two) times daily. 09/25/19   Sheikh, Omair Latif, DO  insulin lispro (HUMALOG) 100 UNIT/ML injection Inject 5-18 Units into the skin See admin instructions. units three times daily before meals Sliding Scale 150-250 = 5 units 251-350= 6-8 units 351-450= 10 units Over 450 18 units    Averneni, Madhavi, MD  metFORMIN (GLUCOPHAGE-XR) 500 MG 24 hr tablet Take 500 mg by mouth daily.    [provider]  metoprolol tartrate (LOPRESSOR) 25 MG tablet Take 1 tablet (25 mg total) by mouth 2 (two) times  daily. 04/11/17   Glade Lloyd, MD  nitroGLYCERIN (NITROSTAT) 0.4 MG SL tablet Place 1 tablet (0.4 mg total) under the tongue every 5 (five) minutes as needed for chest pain. 04/11/17   Glade Lloyd, MD  [facility] VERIO test strip Use to test blood sugar twice daily 09/24/17   [provider]  pantoprazole (PROTONIX) 40 MG tablet Take 1 tablet (40 mg total) by mouth 2 (two) times daily before a meal. 08/21/18   Mikhail, Nita Sells, DO  promethazine (PHENERGAN) 25 MG tablet Take 25-50 mg by mouth every 6 (six) hours as needed for nausea or vomiting.  05/05/18   [provider]  ranolazine (RANEXA) 500 MG 12 hr tablet Take 1 tablet (500 mg total) by mouth 2 (two) times daily. 04/11/17   Glade Lloyd, MD  traMADol (ULTRAM) 50 MG tablet Take 100 mg by mouth every 6 (six) hours as needed for moderate pain.     [provider]    Physical Exam: Vitals:   01/16/20 1053 01/16/20 1056 01/16/20 1324 01/16/20 1326  BP:  (!) 161/77 (!) 176/93 (!) 176/96  Pulse:  (!) 107 (!) 112 (!) 113  Resp:  (!) 26 20 16   Temp: 98.2 F (36.8 C) 98.2 F (36.8 C) 98.2 F (36.8 C)   TempSrc: Oral Oral Oral   SpO2:  95% 91% 92%    General exam: AAOx3. Nauseus, in mild distress, on RA HEENT:Oral mucosa moist, Ear/Nose WNL grossly,  dentition normal. Respiratory system: bilaterally clear,no wheezing or crackles,no use of accessory muscle Cardiovascular system: S1 & S2 +, No JVD,. Gastrointestinal system: Abdomen soft, Obese,  Mild diffuse Tenderness,ND, BS+ Nervous System:Alert, awake, moving extremities and grossly nonfocal Extremities: No edema, distal peripheral pulses palpable.  Skin: No rashes,no icterus. MSK: Normal muscle bulk,tone, power   Labs on Admission: I have personally reviewed following labs and imaging studies  CBC: Recent Labs  Lab 01/16/20 1014  WBC 18.7*  NEUTROABS 16.7*  HGB 14.6  HCT 43.7  MCV 90.7  PLT 340   Basic Metabolic Panel: Recent Labs  Lab 01/16/20 1014 01/16/20 1130  NA 137  --   K 4.3  --   CL 95*  --   CO2 18*  --   GLUCOSE 432*  --   BUN 23  --   CREATININE 1.19  --   CALCIUM 9.5  --   MG  --  1.5*   GFR: CrCl cannot be calculated (Unknown ideal weight.). Liver Function Tests: Recent Labs  Lab 01/16/20 1014  AST 22  ALT 18  ALKPHOS 74  BILITOT 1.9*  PROT 8.3*  ALBUMIN 3.9   Recent Labs  Lab 01/16/20 1014  LIPASE 17   No results for input(s): AMMONIA in the last 168 hours. Coagulation Profile: No results for input(s): INR, PROTIME in the last 168 hours. Cardiac Enzymes: No results for input(s): CKTOTAL, CKMB, CKMBINDEX, TROPONINI in the last 168 hours. BNP (last 3 results) No results for input(s): PROBNP in the last 8760 hours. HbA1C: No results for input(s): HGBA1C in the last 72 hours. CBG: Recent Labs  Lab 01/16/20 1027 01/16/20 1300  GLUCAP 454* 402*   Lipid Profile: No results for input(s): CHOL, HDL, LDLCALC, TRIG, CHOLHDL, LDLDIRECT in the last 72 hours. Thyroid Function Tests: No results for input(s): TSH, T4TOTAL, FREET4, T3FREE, THYROIDAB in the last 72 hours. Anemia Panel: No results for input(s): VITAMINB12, FOLATE, FERRITIN, TIBC, IRON, RETICCTPCT in the last 72 hours. Urine analysis:    Component Value  Date/Time    COLORURINE STRAW (A) 01/16/2020 1120   APPEARANCEUR CLEAR 01/16/2020 1120   LABSPEC 1.024 01/16/2020 1120   PHURINE 5.0 01/16/2020 1120   GLUCOSEU >=500 (A) 01/16/2020 1120   HGBUR SMALL (A) 01/16/2020 1120   BILIRUBINUR NEGATIVE 01/16/2020 1120   KETONESUR 80 (A) 01/16/2020 1120   PROTEINUR 100 (A) 01/16/2020 1120   UROBILINOGEN 1.0 04/05/2015 1155   NITRITE NEGATIVE 01/16/2020 1120   LEUKOCYTESUR NEGATIVE 01/16/2020 1120    Radiological Exams on Admission: No results found.   Assessment/Plan  DKA in the setting of diabetes mellitus on long-term insulin and Metformin.  Patient was admitted to stepdown unit on insulin drip per DKA protocol with serial monitoring of BMP, electrolytes, Accu-Chek once anion gap closes patient able to take orally will start him on diet, long-acting insulin with 2-hour bridge with insulin infusion.  N.p.o. until anion gap closes.  Will give another 1 L bolus.  Continue IV hydration as per glucose and sodium level as ordered per DKA protocol.  Intractable nausea and vomiting with abdominal pain likely due to #1, CT scan abdomen pelvis was done and pending.  LFTs and lipase normal.  We will keep on supportive measures IV fluids nausea medication, PPI, pain control.    Hypomagnesemia 1.5 we will replete appropriately  Dehydration volume depletion due to DKA.  Cont IV fluid hydration as #1. hold Lasix.  Leukocytosis: unclear etiology, likely from DKA. UA WBC 0-5, leukocyte negative nitrate negative glucose more than 500 and ketones 80.  Patient has no respiratory symptoms.  COVID-19 pending.  OSA/COPD: No wheezing continue bronchodilators supplemental oxygen as needed.  History of PE on Eliquis-we will keep him on Lovenox injection while he is not able to take orally.  GERD: Continue PPI   CAD  s/p DES PCI to mLAD history, on aspirin 81, Plavix Lipitor 80, metoprolol, Ranexa.  Continue the home meds.  Essential hypertension: On amlodipine, Lasix,  metoprolol. BP poorly controlled. 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  Patient is followed by primary care and has dressing.  No obvious drainage or redness noted.  There is no height or weight on file to calculate BMI.   * I certify that at the point of admission it is my clinical judgment that the patient will require inpatient hospital care spanning beyond 2 midnights from the point of admission due to high intensity of service, high risk for further deterioration and high frequency of surveillance required due to DKA    DVT prophylaxis: Lovenox Code Status:   Code Status: Full Code  Family Communication: Admission, patients condition and plan of care including tests being ordered have been discussed with the patient who indicate understanding and agree with the plan and Code Status.  Consults called:   Lanae Boast MD [facility]  If 7PM-7AM, please contact night-coverage [email or web address]  01/16/2020, 2:05 PM

## 2020-01-16 NOTE — Progress Notes (Signed)
Cross Cover brief Note  Nurse informed patient stated he wanted to go home. He ws strongly advised against this. He stated he would come back if he needed to

## 2020-01-17 NOTE — Discharge Summary (Signed)
Physician Discharge Summary  [name] CXK:[id number] DOB: 1951-06-06 DOA: 01/16/2020  PCP: [name], MD  Admit date: 01/16/2020 Discharge date: 01/17/2020  Admitted From: Home Disposition:  AMA  Recommendations for Outpatient Follow-up:  1. Follow up with PCP in 1-2 weeks 2. Please obtain BMP/CBC in one week 3. Please follow up on the following pending results:   Discharge Condition: Patient left AGAINST MEDICAL ADVICE Code Status:FULL Diet recommendation:  Diet Order    None       Brief/Interim Summary: HPI: Samuel [name] is a 69 y.o. male with medical history significant for history of CAD status post stent and PE in 2018 on Eliquis, GERD, hypertension, hyperlipidemia, OSA, diabetes mellitus on long-term insulin and with diabetic gastroparesis, peripheral neuropathy, PVD comes to the ED with complaint of nausea vomiting abdominal pain and diarrhea for last 2 days.  Patient's reports persistent nausea vomiting diarrhea nonbloody nonbilious vomiting along with loose stool nonbloody, and diffuse abdominal pain.  He denies any fever, chest pain, shortness of breath, headache, focal weakness. He reports his blood sugar has been fluctuating anywhere from 30s to 300.  ED Course: Hypertensive, tachycardic 110s, blood work showed blood gas pH 7.3, lab with anion gap 24 blood sugar 432 bicarb 18, magnesium 1.5, total bili 1.9, leukocytosis 18.7K, UA with more than 500 glucose, ketones 80,WBC 25,lfts,lipase normal.  Patient given morphine, Zofran, normal saline bolus 1 L.  Patient is a started on insulin infusion and admission was requested for further management. When I saw the patient he was still complaining of nausea otherwise no new complaints.  He appears mildly tachycardic and hypertensive.  Discharge Diagnoses:  Active Problems:   OBSTRUCTIVE SLEEP APNEA   GERD   CAD S/P DES PCI to mLAD: Xience DES 2.75 x 15 (3.0 mm)   Essential hypertension   Hyperlipidemia due to type 2  diabetes mellitus (HCC)   Diabetic gastroparesis (HCC)   Intractable nausea and vomiting   DM (diabetes mellitus), type 2 (HCC)   S/P BKA (below knee amputation) (HCC)   COPD (chronic obstructive pulmonary disease) (HCC)   DKA (diabetic ketoacidoses) (HCC)  Patient was admitted for DKA interval nausea vomiting hypomagnesemia dehydration leukocytosis. His DKA had resolved and he was being transitioned to subcu insulin and diet in the evening however patient did not want to stay in the hospital any longer since he was feeling better and he wanted to leave the hospital. NP was notified and patient was advised not to leave AGAINST MEDICAL ADVICE however he left AGAINST MEDICAL ADVICE despite it understanding/being aware of the risks associated including death. Patient does have his home meds at home Discharge Exam: Vitals:   01/16/20 1706 01/16/20 1917  BP: (!) 142/86 (!) 136/88  Pulse: 100 95  Resp: 15 16  Temp: 98.2 F (36.8 C)   SpO2: 95% 96%   Patient was not examined at the time of living and is medical both by me.  Discharge Instructions   Allergies as of 01/16/2020      Reactions   Gabapentin Hives, Itching, Nausea And Vomiting, Rash   REACTION: rash/hives (per patient, was a reaction to an inactive ingredient in another mgf brand). The patient stated that he does take this medication now and it doesn't cause a rash any more.   Relafen [nabumetone] Nausea And Vomiting, Rash   Codeine Nausea And Vomiting, Rash      Medication List    ASK your doctor about these medications   alum &  mag hydroxide-simeth 200-200-20 MG/5ML suspension Commonly known as: MAALOX/MYLANTA Take 15 mLs by mouth every 6 (six) hours as needed for indigestion or heartburn.   amLODipine 10 MG tablet Commonly known as: NORVASC Take 1 tablet (10 mg total) by mouth daily.   aspirin 81 MG EC tablet Take 1 tablet (81 mg total) by mouth daily.   atorvastatin 80 MG tablet Commonly known as:  LIPITOR Take 1 tablet (80 mg total) by mouth daily at 6 PM.   Basaglar KwikPen 100 UNIT/ML Inject 0.55 mLs (55 Units total) into the skin 2 (two) times daily.   clopidogrel 75 MG tablet Commonly known as: PLAVIX Take 75 mg by mouth daily.   docusate sodium 100 MG capsule Commonly known as: COLACE Take 100 mg by mouth daily.   Eliquis 5 MG Tabs tablet Generic drug: apixaban Take 5 mg by mouth 2 (two) times daily.   furosemide 20 MG tablet Commonly known as: LASIX Take 1 tablet (20 mg total) by mouth daily.   gabapentin 300 MG capsule Commonly known as: NEURONTIN Take 600 mg by mouth 2 (two) times daily.   insulin lispro 100 UNIT/ML injection Commonly known as: HUMALOG Inject 5-18 Units into the skin See admin instructions. units three times daily before meals Sliding Scale 150-250 = 5 units 251-350= 6-8 units 351-450= 10 units Over 450 18 units   metFORMIN 500 MG 24 hr tablet Commonly known as: GLUCOPHAGE-XR Take 500 mg by mouth daily.   metoprolol tartrate 25 MG tablet Commonly known as: LOPRESSOR Take 1 tablet (25 mg total) by mouth 2 (two) times daily.   nitroGLYCERIN 0.4 MG SL tablet Commonly known as: NITROSTAT Place 1 tablet (0.4 mg total) under the tongue every 5 (five) minutes as needed for chest pain.   OneTouch Verio test strip Generic drug: glucose blood Use to test blood sugar twice daily   pantoprazole 40 MG tablet Commonly known as: PROTONIX Take 1 tablet (40 mg total) by mouth 2 (two) times daily before a meal.   promethazine 25 MG tablet Commonly known as: PHENERGAN Take 25-50 mg by mouth every 6 (six) hours as needed for nausea or vomiting.   ranolazine 500 MG 12 hr tablet Commonly known as: RANEXA Take 1 tablet (500 mg total) by mouth 2 (two) times daily.   traMADol 50 MG tablet Commonly known as: ULTRAM Take 100 mg by mouth every 6 (six) hours as needed for moderate pain.       Allergies  Allergen Reactions  . Gabapentin Hives,  Itching, Nausea And Vomiting and Rash    REACTION: rash/hives (per patient, was a reaction to an inactive ingredient in another mgf brand). The patient stated that he does take this medication now and it doesn't cause a rash any more.  . Relafen [Nabumetone] Nausea And Vomiting and Rash  . Codeine Nausea And Vomiting and Rash    The results of significant diagnostics from this hospitalization (including imaging, microbiology, ancillary and laboratory) are listed below for reference.    Microbiology: Recent Results (from the past 240 hour(s))  SARS Coronavirus 2 by RT PCR (hospital order, performed in [facility] hospital lab) Nasopharyngeal Nasopharyngeal Swab     Status: None   Collection Time: 01/16/20  2:21 PM   Specimen: Nasopharyngeal Swab  Result Value Ref Range Status   SARS Coronavirus 2 NEGATIVE NEGATIVE Final    Comment: (NOTE) SARS-CoV-2 target nucleic acids are NOT DETECTED.  The SARS-CoV-2 RNA is generally detectable in upper and lower respiratory  specimens during the acute phase of infection. The lowest concentration of SARS-CoV-2 viral copies this assay can detect is 250 copies / mL. A negative result does not preclude SARS-CoV-2 infection and should not be used as the sole basis for treatment or other patient management decisions.  A negative result may occur with improper specimen collection / handling, submission of specimen other than nasopharyngeal swab, presence of viral mutation(s) within the areas targeted by this assay, and inadequate number of viral copies (<250 copies / mL). A negative result must be combined with clinical observations, patient history, and epidemiological information.  Fact Sheet for Patients:   BoilerBrush.com.cy  Fact Sheet for Healthcare Providers: https://pope.com/  This test is not yet approved or  cleared by the [place] FDA and has been authorized for detection and/or diagnosis  of SARS-CoV-2 by FDA under an Emergency Use Authorization (EUA).  This EUA will remain in effect (meaning this test can be used) for the duration of the COVID-19 declaration under Section 564(b)(1) of the Act, 21 U.S.C. section 360bbb-3(b)(1), unless the authorization is terminated or revoked sooner.  Performed at [facility], [address] [place], [place] [address]     Procedures/Studies: CT ABDOMEN PELVIS WO CONTRAST  Result Date: 01/16/2020 CLINICAL DATA:  69 year old with acute onset of generalized abdominal pain, nausea, vomiting and diarrhea that began last night. EXAM: CT ABDOMEN AND PELVIS WITHOUT CONTRAST TECHNIQUE: Multidetector CT imaging of the abdomen and pelvis was performed following the standard protocol without IV contrast. COMPARISON:  08/17/2018. FINDINGS: Lower chest: Stable linear scarring in the RIGHT MIDDLE LOBE, lingula and the BILATERAL lower lobes. Visualized lung bases otherwise clear. Heart size normal. Severe three-vessel coronary atherosclerosis. Hepatobiliary: Normal unenhanced appearance of the liver. Surgically absent gallbladder. No biliary ductal dilation. Pancreas: Severely atrophic, nearly completely replaced with fat, unchanged. No mass or peripancreatic inflammation. Spleen: Normal unenhanced appearance. Adrenals/Urinary Tract: Normal appearing adrenal glands. Scarring involving the mid LEFT kidney. Stable 3 cm simple cyst arising from the UPPER pole the LEFT kidney. Allowing for the unenhanced technique, no significant focal parenchymal abnormality involving either kidney. No hydronephrosis. No urinary tract calculi. Normal appearing urinary bladder. Stomach/Bowel: Stomach normal in appearance for the degree of distention. Normal-appearing small bowel. Entire colon decompressed and unremarkable. Normal appearing short decompressed appendix in the RIGHT upper pelvis. Vascular/Lymphatic: Moderate aortoiliofemoral atherosclerosis without  evidence of aneurysm. IVC filter. No pathologic lymphadenopathy. Reproductive: Prostate gland and seminal vesicles normal for age. Other: None. Musculoskeletal: DISH involving the lower thoracic spine. Mild degenerative disc disease and spondylosis and mild facet degenerative changes throughout the lumbar spine. Degenerative changes involving the sacroiliac joints. No acute findings. IMPRESSION: 1. No acute abnormalities involving the abdomen or pelvis. 2. Stable severe pancreatic atrophy. 3. Severe three-vessel coronary atherosclerosis. Aortic Atherosclerosis (ICD10-I70.0). Electronically Signed   By: Hulan Saas M.D.   On: 01/16/2020 15:01     Labs: BNP (last 3 results) No results for input(s): BNP in the last 8760 hours. Basic Metabolic Panel: Recent Labs  Lab 01/16/20 1014 01/16/20 1130 01/16/20 1531  NA 137  --  140  K 4.3  --  4.4  CL 95*  --  103  CO2 18*  --  22  GLUCOSE 432*  --  283*  BUN 23  --  23  CREATININE 1.19  --  1.11  CALCIUM 9.5  --  8.8*  MG  --  1.5*  --    Liver Function Tests: Recent Labs  Lab 01/16/20  1014  AST 22  ALT 18  ALKPHOS 74  BILITOT 1.9*  PROT 8.3*  ALBUMIN 3.9   Recent Labs  Lab 01/16/20 1014  LIPASE 17   No results for input(s): AMMONIA in the last 168 hours. CBC: Recent Labs  Lab 01/16/20 1014  WBC 18.7*  NEUTROABS 16.7*  HGB 14.6  HCT 43.7  MCV 90.7  PLT 340   Cardiac Enzymes: No results for input(s): CKTOTAL, CKMB, CKMBINDEX, TROPONINI in the last 168 hours. BNP: Invalid input(s): POCBNP CBG: Recent Labs  Lab 01/16/20 1414 01/16/20 1502 01/16/20 1603 01/16/20 1716 01/16/20 1829  GLUCAP 264* 261* 243* 217* 172*   D-Dimer No results for input(s): DDIMER in the last 72 hours. Hgb A1c Recent Labs    01/16/20 1531  HGBA1C 8.5*   Lipid Profile No results for input(s): CHOL, HDL, LDLCALC, TRIG, CHOLHDL, LDLDIRECT in the last 72 hours. Thyroid function studies No results for input(s): TSH, T4TOTAL,  T3FREE, THYROIDAB in the last 72 hours.  Invalid input(s): FREET3 Anemia work up No results for input(s): VITAMINB12, FOLATE, FERRITIN, TIBC, IRON, RETICCTPCT in the last 72 hours. Urinalysis    Component Value Date/Time   COLORURINE STRAW (A) 01/16/2020 1120   APPEARANCEUR CLEAR 01/16/2020 1120   LABSPEC 1.024 01/16/2020 1120   PHURINE 5.0 01/16/2020 1120   GLUCOSEU >=500 (A) 01/16/2020 1120   HGBUR SMALL (A) 01/16/2020 1120   BILIRUBINUR NEGATIVE 01/16/2020 1120   KETONESUR 80 (A) 01/16/2020 1120   PROTEINUR 100 (A) 01/16/2020 1120   UROBILINOGEN 1.0 04/05/2015 1155   NITRITE NEGATIVE 01/16/2020 1120   LEUKOCYTESUR NEGATIVE 01/16/2020 1120   Sepsis Labs Invalid input(s): PROCALCITONIN,  WBC,  LACTICIDVEN Microbiology Recent Results (from the past 240 hour(s))  SARS Coronavirus 2 by RT PCR (hospital order, performed in [facility] hospital lab) Nasopharyngeal Nasopharyngeal Swab     Status: None   Collection Time: 01/16/20  2:21 PM   Specimen: Nasopharyngeal Swab  Result Value Ref Range Status   SARS Coronavirus 2 NEGATIVE NEGATIVE Final    Comment: (NOTE) SARS-CoV-2 target nucleic acids are NOT DETECTED.  The SARS-CoV-2 RNA is generally detectable in upper and lower respiratory specimens during the acute phase of infection. The lowest concentration of SARS-CoV-2 viral copies this assay can detect is 250 copies / mL. A negative result does not preclude SARS-CoV-2 infection and should not be used as the sole basis for treatment or other patient management decisions.  A negative result may occur with improper specimen collection / handling, submission of specimen other than nasopharyngeal swab, presence of viral mutation(s) within the areas targeted by this assay, and inadequate number of viral copies (<250 copies / mL). A negative result must be combined with clinical observations, patient history, and epidemiological information.  Fact Sheet for Patients:    BoilerBrush.com.cyhttps://www.fda.gov/media/136312/download  Fact Sheet for Healthcare Providers: https://pope.com/https://www.fda.gov/media/136313/download  This test is not yet approved or  cleared by the [place] FDA and has been authorized for detection and/or diagnosis of SARS-CoV-2 by FDA under an Emergency Use Authorization (EUA).  This EUA will remain in effect (meaning this test can be used) for the duration of the COVID-19 declaration under Section 564(b)(1) of the Act, 21 U.S.C. section 360bbb-3(b)(1), unless the authorization is terminated or revoked sooner.  Performed at [facility], [address] [place], [place] [id number]      Time coordinating discharge: 0 minutes  SIGNED: Lanae Boastamesh Eliav Mechling, MD  [facility] 01/17/2020, 4:09 PM  If 7PM-7AM, please contact night-coverage  [email or web address]

## 2020-02-11 DIAGNOSIS — Z89512 Acquired absence of left leg below knee: Secondary | ICD-10-CM | POA: Diagnosis not present

## 2020-03-13 DIAGNOSIS — Z89512 Acquired absence of left leg below knee: Secondary | ICD-10-CM | POA: Diagnosis not present

## 2020-03-14 ENCOUNTER — Encounter (INDEPENDENT_AMBULATORY_CARE_PROVIDER_SITE_OTHER): Payer: PPO | Admitting: Ophthalmology

## 2020-03-15 ENCOUNTER — Other Ambulatory Visit: Payer: Self-pay

## 2020-03-16 ENCOUNTER — Ambulatory Visit: Payer: PPO | Admitting: Family Medicine

## 2020-03-30 ENCOUNTER — Encounter (INDEPENDENT_AMBULATORY_CARE_PROVIDER_SITE_OTHER): Payer: PPO | Admitting: Ophthalmology

## 2020-03-30 DIAGNOSIS — H3581 Retinal edema: Secondary | ICD-10-CM

## 2020-04-11 ENCOUNTER — Encounter: Payer: Self-pay | Admitting: Family Medicine

## 2020-04-11 ENCOUNTER — Ambulatory Visit (INDEPENDENT_AMBULATORY_CARE_PROVIDER_SITE_OTHER): Payer: PPO | Admitting: Family Medicine

## 2020-04-11 ENCOUNTER — Other Ambulatory Visit: Payer: Self-pay

## 2020-04-11 VITALS — BP 124/68 | HR 89 | Temp 98.1°F | Ht 75.0 in | Wt 235.0 lb

## 2020-04-11 DIAGNOSIS — Z794 Long term (current) use of insulin: Secondary | ICD-10-CM

## 2020-04-11 DIAGNOSIS — I10 Essential (primary) hypertension: Secondary | ICD-10-CM | POA: Diagnosis not present

## 2020-04-11 DIAGNOSIS — I739 Peripheral vascular disease, unspecified: Secondary | ICD-10-CM | POA: Diagnosis not present

## 2020-04-11 DIAGNOSIS — Z23 Encounter for immunization: Secondary | ICD-10-CM

## 2020-04-11 DIAGNOSIS — E1165 Type 2 diabetes mellitus with hyperglycemia: Secondary | ICD-10-CM

## 2020-04-11 DIAGNOSIS — E7800 Pure hypercholesterolemia, unspecified: Secondary | ICD-10-CM | POA: Diagnosis not present

## 2020-04-11 DIAGNOSIS — Z89512 Acquired absence of left leg below knee: Secondary | ICD-10-CM | POA: Diagnosis not present

## 2020-04-11 DIAGNOSIS — Z0000 Encounter for general adult medical examination without abnormal findings: Secondary | ICD-10-CM | POA: Diagnosis not present

## 2020-04-11 LAB — LIPID PANEL
Cholesterol: 156 mg/dL (ref 0–200)
HDL: 31.2 mg/dL — ABNORMAL LOW (ref >39.00)
NonHDL: 125.12
Total CHOL/HDL Ratio: 5
Triglycerides: 225 mg/dL — ABNORMAL HIGH (ref 0.0–149.0)
VLDL: 45 mg/dL — ABNORMAL HIGH (ref 0.0–40.0)

## 2020-04-11 LAB — CBC
HCT: 39.5 % (ref 39.0–52.0)
Hemoglobin: 13.5 g/dL (ref 13.0–17.0)
MCHC: 34.1 g/dL (ref 30.0–36.0)
MCV: 89.2 fl (ref 78.0–100.0)
Platelets: 211 10*3/uL (ref 150.0–400.0)
RBC: 4.43 Mil/uL (ref 4.22–5.81)
RDW: 13.4 % (ref 11.5–15.5)
WBC: 5.4 10*3/uL (ref 4.0–10.5)

## 2020-04-11 LAB — COMPREHENSIVE METABOLIC PANEL
ALT: 15 U/L (ref 0–53)
AST: 15 U/L (ref 0–37)
Albumin: 3.5 g/dL (ref 3.5–5.2)
Alkaline Phosphatase: 79 U/L (ref 39–117)
BUN: 24 mg/dL — ABNORMAL HIGH (ref 6–23)
CO2: 27 mEq/L (ref 19–32)
Calcium: 8.7 mg/dL (ref 8.4–10.5)
Chloride: 97 mEq/L (ref 96–112)
Creatinine, Ser: 0.92 mg/dL (ref 0.40–1.50)
GFR: 86.32 mL/min (ref >60.00)
Glucose, Bld: 216 mg/dL — ABNORMAL HIGH (ref 70–99)
Potassium: 3.9 mEq/L (ref 3.5–5.1)
Sodium: 134 mEq/L — ABNORMAL LOW (ref 135–145)
Total Bilirubin: 0.3 mg/dL (ref 0.2–1.2)
Total Protein: 6.4 g/dL (ref 6.0–8.3)

## 2020-04-11 LAB — MICROALBUMIN / CREATININE URINE RATIO
Creatinine,U: 103 mg/dL
Microalb Creat Ratio: 34.9 mg/g — ABNORMAL HIGH (ref 0.0–30.0)
Microalb, Ur: 36 mg/dL — ABNORMAL HIGH (ref 0.0–1.9)

## 2020-04-11 LAB — URINALYSIS, ROUTINE W REFLEX MICROSCOPIC
Bilirubin Urine: NEGATIVE
Ketones, ur: NEGATIVE
Leukocytes,Ua: NEGATIVE
Nitrite: NEGATIVE
Specific Gravity, Urine: 1.025 (ref 1.000–1.030)
Total Protein, Urine: 30 — AB
Urine Glucose: >=1000 — AB
Urobilinogen, UA: 0.2 (ref 0.0–1.0)
pH: 6 (ref 5.0–8.0)

## 2020-04-11 LAB — PSA: PSA: 1.39 ng/mL (ref 0.10–4.00)

## 2020-04-11 LAB — LDL CHOLESTEROL, DIRECT: Direct LDL: 101 mg/dL

## 2020-04-11 LAB — HEMOGLOBIN A1C: Hgb A1c MFr Bld: 13.1 % — ABNORMAL HIGH (ref 4.6–6.5)

## 2020-04-11 NOTE — Progress Notes (Addendum)
New Patient Office Visit  Subjective:  Patient ID: Samuel [name], male    DOB: 1950/06/23  Age: 69 y.o. MRN: [id number]  CC:  Chief Complaint  Patient presents with   Establish Care    New patient, patient would like to know if he should have booster shot.     HPI Samuel [name] presents for establishment of care by way of transfer.  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  History of diabetic gastroparesis.  For his diabetes he is taking 55 units of Lantus twice daily and sometimes more he tells me.  He eats 1 meal a day and prior to that, he  follows a sliding scale given to him by his provider.  He is accompanied today by his cousin.  Currently lives independently.  He has no problems with flow of urination or stooling.  He is currently also under my care of an ophthalmologist, Dr. Elmer Picker Dr. Ashley Royalty.  Also seeing [facility] cardiology and pulmonology.  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 of 2012   Shortness of breath    exertion   Type II diabetes mellitus (HCC)     Past Surgical History:  Procedure Laterality Date   AMPUTATION Left 03/08/2015   Procedure:  LEFT AMPUTATION BELOW KNEE;  Surgeon: Toni Arthurs, MD;   Location: [facility];  Service: Orthopedics;  Laterality: Left;   APPLICATION OF WOUND VAC Left 01/07/2014   CHOLECYSTECTOMY  06/2010   CORONARY ANGIOPLASTY WITH STENT PLACEMENT  05/2009   "1"   FOOT SURGERY Left 2010   "for Charcot's joint"   HARDWARE REMOVAL Left 01/07/2014   Procedure: LEFT LEG REMOVAL OF DEEP IMPLANT AND SEQUESTRECTOMY; APPLICATION OF WOUND VAC ;  Surgeon: Toni Arthurs, MD;  Location: [facility] OR;  Service: Orthopedics;  Laterality: Left;   I & D EXTREMITY Left "multiple"   leg   I & D EXTREMITY Left 01/07/2014   Procedure: 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;  Surgeon: Toni Arthurs, MD;  Location: [facility] OR;  Service: Orthopedics;  Laterality: Left;   IM NAILING TIBIA Left ~ 2012   LEFT HEART CATH AND CORONARY ANGIOGRAPHY N/A 04/08/2017   Procedure: LEFT HEART CATH AND CORONARY ANGIOGRAPHY;  Surgeon: Runell Gess, MD;  Location: [facility];  Service: Cardiovascular;  Laterality: N/A;   SHOULDER ARTHROSCOPY W/ ROTATOR CUFF REPAIR Left    "and bone spurs"   TIBIAL IM ROD REMOVAL Left 01/07/2014   TOE AMPUTATION Right ~ 2011   "great toe"   VENA CAVA FILTER PLACEMENT  2012   VENA CAVA FILTER PLACEMENT N/A 11/20/2016   Procedure: INSERTION  VENA-CAVA FILTER;  Surgeon: Sherren Kerns, MD;  [facility];  Service: Vascular;  Laterality: N/A;   WOUND DEBRIDEMENT Left 01/07/2014   "tibia"    Family History  Problem Relation Age of Onset   COPD Mother    Heart disease Mother    Lung cancer Mother    Retinal detachment Father    Alcoholism Brother    Heart disease Brother    COPD Brother    Diabetes Brother    Hypertension Brother    Stroke Brother     Social History   Socioeconomic History   Marital status: Divorced    Spouse name: Not on file   Number of children: Not on file   Years of education: 12   Highest education level: Not on file  Occupational History   Occupation: DISABLITY    Employer: UNEMPLOYED    Tobacco Use   Smoking status: Never Smoker   Smokeless tobacco: Never Used  Building services engineer Use: Never used  Substance and Sexual Activity   Alcohol use: No    Comment: 01/07/2014 'might have a wine cooler a couple times/yr"   Drug use: No   Sexual activity: Never  Other Topics Concern   Not on file  Social History Narrative   Not on file   Social Determinants of Health   Financial Resource Strain:    Difficulty of Paying Living Expenses: Not on file  Food Insecurity:    Worried About Running Out of Food in the Last Year: Not on file   [employer] of Food in the Last Year: Not on file  Transportation Needs:    Lack of Transportation (Medical): Not on file   Lack of Transportation (Non-Medical): Not on file  Physical Activity:    Days of Exercise per Week: Not on file   Minutes of Exercise per Session: Not on file  Stress:    Feeling of Stress : Not on file  Social Connections:    Frequency of Communication with Friends and Family: Not on file   Frequency of Social Gatherings with Friends and Family: Not on file   Attends Religious Services: Not on file   Active Member of Clubs or Organizations: Not on file   Attends Banker Meetings: Not on file   Marital Status: Not on file  Intimate Partner Violence:    Fear of Current or Ex-Partner: Not on file   Emotionally Abused: Not on file   Physically Abused: Not on file   Sexually Abused: Not on file    ROS Review of Systems  Constitutional: Negative.   HENT: Negative.   Eyes: Negative for photophobia and visual disturbance.  Respiratory: Negative.   Cardiovascular: Negative for chest pain and palpitations.  Gastrointestinal: Positive for nausea. Negative for anal bleeding, blood in stool, constipation and vomiting.  Endocrine: Negative for polyphagia and polyuria.  Genitourinary: Negative for difficulty urinating, frequency and urgency.  Musculoskeletal: Positive for gait  problem.  Allergic/Immunologic: Negative for immunocompromised state.  Neurological: Negative for tremors and speech difficulty.  Hematological: Bruises/bleeds easily.   Depression screen [facility] 2/9 04/11/2020 04/11/2020 07/28/2018  Decreased Interest 0 0 0  Down, Depressed, Hopeless 0 0 1  PHQ - 2 Score 0 0 1  Altered sleeping 1 - -  Tired, decreased energy 3 - -  Change in appetite 2 - -  Feeling bad or failure about yourself  3 - -  Trouble concentrating 0 - -  Moving slowly or  fidgety/restless 0 - -  Suicidal thoughts 0 - -  PHQ-9 Score 9 - -  Some recent data might be hidden    Objective:   Today's Vitals: BP 124/68    Pulse 89    Temp 98.1 F (36.7 C) (Tympanic)    Ht 6\' 3"  (1.905 m)    Wt 235 lb (106.6 kg) Comment: WEIGHT WITH WHEEL CHAIR 290LBS   SpO2 95%    BMI 29.37 kg/m   Physical Exam Vitals and nursing note reviewed.  Constitutional:      General: He is not in acute distress.    Appearance: Normal appearance. He is ill-appearing. He is not toxic-appearing or diaphoretic.  HENT:     Head: Normocephalic and atraumatic.     Right Ear: External ear normal.     Left Ear: External ear normal.  Eyes:     General: No scleral icterus.       Right eye: No discharge.        Left eye: No discharge.     Conjunctiva/sclera: Conjunctivae normal.  Cardiovascular:     Rate and Rhythm: Normal rate and regular rhythm.  Pulmonary:     Effort: Pulmonary effort is normal.     Breath sounds: Normal breath sounds.  Abdominal:     General: Bowel sounds are normal.  Musculoskeletal:     Cervical back: No rigidity or tenderness.     Right lower leg: No edema.  Lymphadenopathy:     Cervical: No cervical adenopathy.  Skin:    General: Skin is warm and dry.  Neurological:     Mental Status: He is alert and oriented to person, place, and time.  Psychiatric:        Mood and Affect: Mood normal.        Behavior: Behavior normal.     Assessment & Plan:   Problem List Items  Addressed This Visit      Cardiovascular and Mediastinum   Essential hypertension - Primary (Chronic)   Relevant Orders   CBC (Completed)   Comprehensive metabolic panel (Completed)   Urinalysis, Routine w reflex microscopic (Completed)   Microalbumin / creatinine urine ratio (Completed)   PVD (peripheral vascular disease) (HCC)   Relevant Orders   Comprehensive metabolic panel (Completed)   Lipid panel (Completed)     Endocrine   DM (diabetes mellitus), type 2 (HCC) (Chronic)   Relevant Orders   Comprehensive metabolic panel (Completed)   Hemoglobin A1c (Completed)   Urinalysis, Routine w reflex microscopic (Completed)   Microalbumin / creatinine urine ratio (Completed)   Poorly controlled type 2 diabetes mellitus (HCC)   Relevant Orders   Ambulatory referral to Endocrinology     Other   S/P BKA (below knee amputation) (HCC)   Elevated cholesterol   Relevant Orders   Comprehensive metabolic panel (Completed)   Lipid panel (Completed)    Other Visit Diagnoses    Healthcare maintenance       Relevant Orders   PSA (Completed)   Need for influenza vaccination       Relevant Orders   Flu Vaccine QUAD High Dose(Fluad) (Completed)      Outpatient Encounter Medications as of 04/11/2020  Medication Sig   alum & mag hydroxide-simeth (MAALOX/MYLANTA) 200-200-20 MG/5ML suspension Take 15 mLs by mouth every 6 (six) hours as needed for indigestion or heartburn.   amLODipine (NORVASC) 10 MG tablet Take 1 tablet (10 mg total) by mouth daily.   apixaban (ELIQUIS) 5 MG TABS tablet Take  5 mg by mouth 2 (two) times daily.   aspirin 81 MG EC tablet Take 1 tablet (81 mg total) by mouth daily.   atorvastatin (LIPITOR) 80 MG tablet Take 1 tablet (80 mg total) by mouth daily at 6 PM. (Patient taking differently: Take 80 mg by mouth daily. )   clopidogrel (PLAVIX) 75 MG tablet Take 75 mg by mouth daily.   docusate sodium (COLACE) 100 MG capsule Take 100 mg by mouth daily.    furosemide (LASIX) 20 MG tablet Take 1 tablet (20 mg total) by mouth daily.   gabapentin (NEURONTIN) 300 MG capsule Take 600 mg by mouth 2 (two) times daily.    Insulin Glargine (BASAGLAR KWIKPEN) 100 UNIT/ML Inject 0.55 mLs (55 Units total) into the skin 2 (two) times daily.   insulin lispro (HUMALOG) 100 UNIT/ML injection Inject 5-18 Units into the skin See admin instructions. units three times daily before meals Sliding Scale 150-250 = 5 units 251-350= 6-8 units 351-450= 10 units Over 450 18 units   metFORMIN (GLUCOPHAGE-XR) 500 MG 24 hr tablet Take 500 mg by mouth daily.   metoprolol tartrate (LOPRESSOR) 25 MG tablet Take 1 tablet (25 mg total) by mouth 2 (two) times daily.   nitroGLYCERIN (NITROSTAT) 0.4 MG SL tablet Place 1 tablet (0.4 mg total) under the tongue every 5 (five) minutes as needed for chest pain.   ONETOUCH VERIO test strip Use to test blood sugar twice daily   pantoprazole (PROTONIX) 40 MG tablet Take 1 tablet (40 mg total) by mouth 2 (two) times daily before a meal.   promethazine (PHENERGAN) 25 MG tablet Take 25-50 mg by mouth every 6 (six) hours as needed for nausea or vomiting.    ranolazine (RANEXA) 500 MG 12 hr tablet Take 1 tablet (500 mg total) by mouth 2 (two) times daily.   traMADol (ULTRAM) 50 MG tablet Take 100 mg by mouth every 6 (six) hours as needed for moderate pain.    No facility-administered encounter medications on file as of 04/11/2020.    Follow-up: Return in about 4 months (around 08/12/2020).   All medications were reviewed.  Fasting labs are drawn today.  Further recommendations will pend results of blood work today.  Flu vaccine today.  Covid booster in the next 2 to 3 days. Mliss Sax, MD

## 2020-04-12 DIAGNOSIS — Z89512 Acquired absence of left leg below knee: Secondary | ICD-10-CM | POA: Diagnosis not present

## 2020-04-12 NOTE — Addendum Note (Signed)
Addended by: Andrez Grime on: 04/12/2020 07:31 AM   Modules accepted: Orders

## 2020-04-26 ENCOUNTER — Ambulatory Visit: Payer: PPO

## 2020-04-26 ENCOUNTER — Telehealth: Payer: Self-pay

## 2020-04-26 NOTE — Progress Notes (Deleted)
Subjective:   CLOYCE BLANKENHORN is a 69 y.o. male who presents for an Initial Medicare Annual Wellness Visit.  I connected with Wilfrid today by telephone and verified that I am speaking with the correct person using two identifiers. Location patient: home Location provider: work Persons participating in the virtual visit: patient, Engineer, civil (consulting).    I discussed the limitations, risks, security and privacy concerns of performing an evaluation and management service by telephone and the availability of in person appointments. I also discussed with the patient that there may be a patient responsible charge related to this service. The patient expressed understanding and verbally consented to this telephonic visit.    Interactive audio and video telecommunications were attempted between this provider and patient, however failed, due to patient having technical difficulties OR patient did not have access to video capability.  We continued and completed visit with audio only.  Some vital signs may be absent or patient reported.   Time Spent with patient on telephone encounter: *** minutes   Review of Systems    ***       Objective:    There were no vitals filed for this visit. There is no height or weight on file to calculate BMI.  Advanced Directives 01/16/2020 01/16/2020 10/16/2019 09/23/2019 08/09/2019 08/09/2019 06/22/2019  Does Patient Have a Medical Advance Directive? No Unable to assess, patient is non-responsive or altered mental status No No No No No  Type of Advance Directive - - - - - - -  Does patient want to make changes to medical advance directive? - - - - - - -  Copy of Healthcare Power of Attorney in Chart? - - - - - - -  Would patient like information on creating a medical advance directive? No - Guardian declined - No - Patient declined No - Patient declined No - Patient declined No - Patient declined -  Pre-existing out of facility DNR order (yellow form or pink MOST form) - - - - - - -     Current Medications (verified) Outpatient Encounter Medications as of 04/26/2020  Medication Sig  . alum & mag hydroxide-simeth (MAALOX/MYLANTA) 200-200-20 MG/5ML suspension Take 15 mLs by mouth every 6 (six) hours as needed for indigestion or heartburn.  [name] amLODipine (NORVASC) 10 MG tablet Take 1 tablet (10 mg total) by mouth daily.  [name] apixaban (ELIQUIS) 5 MG TABS tablet Take 5 mg by mouth 2 (two) times daily.  [name] aspirin 81 MG EC tablet Take 1 tablet (81 mg total) by mouth daily.  [name] atorvastatin (LIPITOR) 80 MG tablet Take 1 tablet (80 mg total) by mouth daily at 6 PM. (Patient taking differently: Take 80 mg by mouth daily. )  . clopidogrel (PLAVIX) 75 MG tablet Take 75 mg by mouth daily.  [name] docusate sodium (COLACE) 100 MG capsule Take 100 mg by mouth daily.  . furosemide (LASIX) 20 MG tablet Take 1 tablet (20 mg total) by mouth daily.  [name] gabapentin (NEURONTIN) 300 MG capsule Take 600 mg by mouth 2 (two) times daily.   . Insulin Glargine (BASAGLAR KWIKPEN) 100 UNIT/ML Inject 0.55 mLs (55 Units total) into the skin 2 (two) times daily.  . insulin lispro (HUMALOG) 100 UNIT/ML injection Inject 5-18 Units into the skin See admin instructions. units three times daily before meals Sliding Scale 150-250 = 5 units 251-350= 6-8 units 351-450= 10 units Over 450 18 units  . metFORMIN (GLUCOPHAGE-XR) 500 MG 24 hr tablet Take 500 mg by mouth daily.  [name]  metoprolol tartrate (LOPRESSOR) 25 MG tablet Take 1 tablet (25 mg total) by mouth 2 (two) times daily.  . nitroGLYCERIN (NITROSTAT) 0.4 MG SL tablet Place 1 tablet (0.4 mg total) under the tongue every 5 (five) minutes as needed for chest pain.  Letta Pate VERIO test strip Use to test blood sugar twice daily  . pantoprazole (PROTONIX) 40 MG tablet Take 1 tablet (40 mg total) by mouth 2 (two) times daily before a meal.  . promethazine (PHENERGAN) 25 MG tablet Take 25-50 mg by mouth every 6 (six) hours as needed for nausea or vomiting.   . ranolazine  (RANEXA) 500 MG 12 hr tablet Take 1 tablet (500 mg total) by mouth 2 (two) times daily.  . traMADol (ULTRAM) 50 MG tablet Take 100 mg by mouth every 6 (six) hours as needed for moderate pain.    No facility-administered encounter medications on file as of 04/26/2020.    Allergies (verified) Gabapentin, Relafen [nabumetone], and Codeine   History: Past Medical History:  Diagnosis Date  . Anxiety   . Arthritis    "all over"   . CAD (coronary artery disease)   . Charcot's joint    "left foot"  . 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 Depression   . Gastroparesis   . GERD (gastroesophageal reflux disease)   . H/O hiatal hernia   . Hyperlipidemia   . Hypertension   . Myocardial infarction (HCC) 2017/03/27   around this date  . OSA (obstructive sleep apnea)    "not bad enough for a mask"  . Peripheral neuropathy   . Peripheral vascular disease (HCC)   . PONV (postoperative nausea and vomiting)   . Pulmonary embolism (HCC)    hx. of 2012  . Shortness of breath    exertion  . Type II diabetes mellitus (HCC)    Past Surgical History:  Procedure Laterality Date  . AMPUTATION Left 03/08/2015   Procedure:  LEFT AMPUTATION BELOW KNEE;  Surgeon: Toni Arthurs, MD;  Location: [facility];  Service: Orthopedics;  Laterality: Left;  . APPLICATION OF WOUND VAC Left 01/07/2014  . CHOLECYSTECTOMY  06/2010  . CORONARY ANGIOPLASTY WITH STENT PLACEMENT  05/2009   "1"  . FOOT SURGERY Left 2010   "for Charcot's joint"  . HARDWARE REMOVAL Left 01/07/2014   Procedure: LEFT LEG REMOVAL OF DEEP IMPLANT AND SEQUESTRECTOMY; APPLICATION OF WOUND VAC ;  Surgeon: Toni Arthurs, MD;  Location: [facility] OR;  Service: Orthopedics;  Laterality: Left;  . I & D EXTREMITY Left "multiple"   leg  . 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 IM NAILING TIBIA Left ~  2012  . LEFT HEART CATH AND CORONARY ANGIOGRAPHY N/A 04/08/2017   Procedure: LEFT HEART CATH AND CORONARY ANGIOGRAPHY;  Surgeon: Runell Gess, MD;  Location: [facility] INVASIVE [facility];  Service: Cardiovascular;  Laterality: N/A;  . SHOULDER ARTHROSCOPY W/ ROTATOR CUFF REPAIR Left    "and bone spurs"  . TIBIAL IM ROD REMOVAL Left 01/07/2014  . TOE AMPUTATION Right ~ 2011   "great toe"  . VENA CAVA FILTER PLACEMENT  2012  . VENA CAVA FILTER PLACEMENT N/A 11/20/2016   Procedure: INSERTION VENA-CAVA FILTER;  Surgeon: Sherren Kerns, MD;  Location: [facility] OR;  Service: Vascular;  Laterality: N/A;  . WOUND DEBRIDEMENT Left 01/07/2014   "tibia"  Family History  Problem Relation Age of Onset  . COPD Mother   . Heart disease Mother   . Lung cancer Mother   . Retinal detachment Father   . Alcoholism Brother   . Heart disease Brother   . COPD Brother   . Diabetes Brother   . Hypertension Brother   . Stroke Brother    Social History   Socioeconomic History  . Marital status: Divorced    Spouse name: Not on file  . Number of children: Not on file  . Years of education: 38  . Highest education level: Not on file  Occupational History  . Occupation: DISABLITY    Employer: UNEMPLOYED  Tobacco Use  . Smoking status: Never Smoker  . Smokeless tobacco: Never Used  Vaping Use  . Vaping Use: Never used  Substance and Sexual Activity  . Alcohol use: No    Comment: 01/07/2014 'might have a wine cooler a couple times/yr"  . Drug use: No  . Sexual activity: Never  Other Topics Concern  . Not on file  Social History Narrative  . Not on file   Social Determinants of Health   Financial Resource Strain:   . Difficulty of Paying Living Expenses: Not on file  Food Insecurity:   . Worried About Programme researcher, broadcasting/film/video in the Last Year: Not on file  . Ran Out of Food in the Last Year: Not on file  Transportation Needs:   . Lack of Transportation (Medical): Not on file  . Lack of Transportation  (Non-Medical): Not on file  Physical Activity:   . Days of Exercise per Week: Not on file  . Minutes of Exercise per Session: Not on file  Stress:   . Feeling of Stress : Not on file  Social Connections:   . Frequency of Communication with Friends and Family: Not on file  . Frequency of Social Gatherings with Friends and Family: Not on file  . Attends Religious Services: Not on file  . Active Member of Clubs or Organizations: Not on file  . Attends Banker Meetings: Not on file  . Marital Status: Not on file    Tobacco Counseling Counseling given: Not Answered   Clinical Intake:                 Diabetes:  Is the patient diabetic?  Yes  If diabetic, was a CBG obtained today?  No  Did the patient bring in their glucometer from home?  No phone visit How often do you monitor your CBG's? ***.   Financial Strains and Diabetes Management:  Are you having any financial strains with the device, your supplies or your medication? {YES/NO:21197}.  Does the patient want to be seen by Chronic Care Management for management of their diabetes?  {YES/NO:21197} Would the patient like to be referred to a Nutritionist or for Diabetic Management?  {YES/NO:21197}  Diabetic Exams:  Diabetic Eye Exam: Completed 10/21/2019 .   Diabetic Foot Exam:  Pt has been advised about the importance in completing this exam. To be completed by PCP            Activities of Daily Living In your present state of health, do you have any difficulty performing the following activities: 09/23/2019 06/09/2019  Hearing? N N  Vision? N N  Difficulty concentrating or making decisions? N N  Walking or climbing stairs? Y Y  Comment - left bka and weakness  Dressing or bathing? Malvin Johns  Doing errands,  shopping? Y N  Comment - patient drives-niece helps getting him in and out of car  Some recent data might be hidden    Patient Care Team: Mliss SaxKremer, William Alfred, MD as PCP - General (Family  Medicine) Cliffton Astersampbell, Kelyn, MD as Consulting Physician (Infectious Diseases) Mercy RidingBray, Bryan, [facility] as Referring Physician (Pharmacist) Toni ArthursHewitt, Nylan, MD as Consulting Physician (Orthopedic Surgery)  Indicate any recent Medical Services you may have received from other than [facility] providers in the past year (date may be approximate).     Assessment:   This is a routine wellness examination for Ruairi.  Hearing/Vision screen No exam data present  Dietary issues and exercise activities discussed:    Goals   None    Depression Screen PHQ 2/9 Scores 04/11/2020 04/11/2020 07/28/2018 06/03/2018 09/25/2017 06/14/2017 04/19/2017  PHQ - 2 Score 0 0 1 1 0 0 0  PHQ- 9 Score 9 - - - - - -    Fall Risk Fall Risk  04/11/2020 07/28/2018 09/25/2017 08/27/2017 08/08/2017  Falls in the past year? 0 0 No (No Data) (No Data)  Comment - - - Patient denies new/ recent falls Patient denies new/ recent falls  Number falls in past yr: - - - - -  Injury with Fall? - - - - -  Risk Factor Category  - - - - -  Risk for fall due to : - - - - -  Follow up - - - - -    Any stairs in or around the home? {YES/NO:21197} If so, are there any without handrails? {YES/NO:21197} Home free of loose throw rugs in walkways, pet beds, electrical cords, etc? {YES/NO:21197} Adequate lighting in your home to reduce risk of falls? {YES/NO:21197}  ASSISTIVE DEVICES UTILIZED TO PREVENT FALLS:  Life alert? {YES/NO:21197} Use of a cane, walker or w/c? {YES/NO:21197} Grab bars in the bathroom? {YES/NO:21197} Shower chair or bench in shower? {YES/NO:21197} Elevated toilet seat or a handicapped toilet? {YES/NO:21197}  TIMED UP AND GO:  Was the test performed? {YES/NO:21197}.  Length of time to ambulate 10 feet: *** sec.   {Appearance of KGMW:1027253}Gait:2101803}  Cognitive Function:        Immunizations Immunization History  Administered Date(s) Administered  . Fluad Quad(high Dose 65+) 04/11/2020  . Influenza,inj,Quad PF,6+ Mos  03/02/2014  . Moderna SARS-COVID-2 Vaccination 08/26/2019, 09/23/2019  . PPD Test 03/15/2015    TDAP status: Due, Education has been provided regarding the importance of this vaccine. Advised may receive this vaccine at local pharmacy or Health Dept. Aware to provide a copy of the vaccination record if obtained from local pharmacy or Health Dept. Verbalized acceptance and understanding.   Flu Vaccine status: Up to date   {Pneumococcal vaccine status:2101807}   Covid-19 vaccine status: Completed vaccines  Qualifies for Shingles Vaccine? Yes   Zostavax completed No   Shingrix Completed?: No.    Education has been provided regarding the importance of this vaccine. Patient has been advised to call insurance company to determine out of pocket expense if they have not yet received this vaccine. Advised may also receive vaccine at local pharmacy or Health Dept. Verbalized acceptance and understanding.  Screening Tests Health Maintenance  Topic Date Due  . Hepatitis C Screening  Never done  . FOOT EXAM  Never done  . TETANUS/TDAP  Never done  . PNA vac Low Risk Adult (1 of 2 - PCV13) Never done  . HEMOGLOBIN A1C  10/10/2020  . OPHTHALMOLOGY EXAM  10/20/2020  . URINE MICROALBUMIN  04/11/2021  .  COLONOSCOPY  11/01/2022  . INFLUENZA VACCINE  Completed  . COVID-19 Vaccine  Completed    Health Maintenance  Health Maintenance Due  Topic Date Due  . Hepatitis C Screening  Never done  . FOOT EXAM  Never done  . TETANUS/TDAP  Never done  . PNA vac Low Risk Adult (1 of 2 - PCV13) Never done    Colorectal cancer screening: Completed Colonoscopy 10/31/2012. Repeat every 10 years  Lung Cancer Screening: (Low Dose CT Chest recommended if Age 58-80 years, 30 pack-year currently smoking OR have quit w/in 15years.) does not qualify.     Additional Screening:  Hepatitis C Screening: does qualify; Discuss with PCP  Vision Screening: Recommended annual ophthalmology exams for early detection  of glaucoma and other disorders of the eye. Is the patient up to date with their annual eye exam?  Yes  Who is the provider or what is the name of the office in which the patient attends annual eye exams? ***   Dental Screening: Recommended annual dental exams for proper oral hygiene  Community Resource Referral / Chronic Care Management: CRR required this visit?  {YES/NO:21197}  CCM required this visit?  {YES/NO:21197}     Plan:     I have personally reviewed and noted the following in the patient's chart:   . Medical and social history . Use of alcohol, tobacco or illicit drugs  . Current medications and supplements . Functional ability and status . Nutritional status . Physical activity . Advanced directives . List of other physicians . Hospitalizations, surgeries, and ER visits in previous 12 months . Vitals . Screenings to include cognitive, depression, and falls . Referrals and appointments  In addition, I have reviewed and discussed with patient certain preventive protocols, quality metrics, and best practice recommendations. A written personalized care plan for preventive services as well as general preventive health recommendations were provided to patient.   Due to this being a telephonic visit, the after visit summary with patients personalized plan was offered to patient via mail or my-chart. Patient would like to access on my-chart.   Roanna Raider, LPN   [id number]  Nurse Health Advisor  Nurse Notes: ***

## 2020-04-26 NOTE — Telephone Encounter (Signed)
Attempted to reach patient x 3 for his 3:00 wellness visit. No answer. Left message for patient to call back to reschedule.

## 2020-05-09 ENCOUNTER — Ambulatory Visit: Payer: PPO | Admitting: Family Medicine

## 2020-05-13 DIAGNOSIS — Z89512 Acquired absence of left leg below knee: Secondary | ICD-10-CM | POA: Diagnosis not present

## 2020-06-12 DIAGNOSIS — Z89512 Acquired absence of left leg below knee: Secondary | ICD-10-CM | POA: Diagnosis not present

## 2020-06-13 ENCOUNTER — Telehealth: Payer: Self-pay | Admitting: Family Medicine

## 2020-06-13 NOTE — Telephone Encounter (Signed)
Patient is calling to get a prescription for stump shrinkers. He needs this sent to [facility], occupational number-[phone]) Order number: [id number]

## 2020-06-13 NOTE — Telephone Encounter (Signed)
I think this was sent to me by accident

## 2020-06-13 NOTE — Telephone Encounter (Signed)
Please advise message below. Okay for order/Rx

## 2020-06-16 ENCOUNTER — Telehealth: Payer: Self-pay

## 2020-06-16 ENCOUNTER — Encounter: Payer: Self-pay | Admitting: Family Medicine

## 2020-06-16 NOTE — Telephone Encounter (Signed)
Pt called back and I explained to him message below.  Pt said that  He got the rx from his last PCP.  Pt explained that he just need a RX written on a Rx pad for the stump shrinkers and it fax over to [facility].  Pt said that he is going to call [employer] and give [place] a call back.  I informed pt that I would send this message back to nurse.

## 2020-06-16 NOTE — Telephone Encounter (Signed)
Patient calling to inform office that he needed to get approved for [employer] access [place] to have transportation to get to his eye doctor's appointment.  Pt received a letter from [facility] stating that the provider do not complete the form that was sent to this office.  Pt said that he would call back after he finds the number to [employer] so we can call to ask what is it that they need from [place].

## 2020-06-16 NOTE — Telephone Encounter (Signed)
Patient called back to let the office know that he spoke to [employer] and they said that the office could go ahead and fax over the prescription for the stump shrinkers.

## 2020-06-16 NOTE — Telephone Encounter (Signed)
Called patient regarding stump shrinkers, spoke with [employer] who informed me that patient would need to contact clinic/doctor who preformed procedure so they can write prescription and sent notes from last visit regarding stump. No answer from patient will call back.

## 2020-06-20 NOTE — Telephone Encounter (Signed)
Forms faxed

## 2020-06-21 ENCOUNTER — Ambulatory Visit: Payer: PPO

## 2020-06-21 NOTE — Progress Notes (Deleted)
Subjective:   Samuel [name] is a 71 y.o. male who presents for an Initial Medicare Annual Wellness Visit.  I connected with Corrigan today by telephone and verified that I am speaking with the correct person using two identifiers. Location patient: home Location provider: work Persons participating in the virtual visit: patient, Marine scientist.    I discussed the limitations, risks, security and privacy concerns of performing an evaluation and management service by telephone and the availability of in person appointments. I also discussed with the patient that there may be a patient responsible charge related to this service. The patient expressed understanding and verbally consented to this telephonic visit.    Interactive audio and video telecommunications were attempted between this provider and patient, however failed, due to patient having technical difficulties OR patient did not have access to video capability.  We continued and completed visit with audio only.  Some vital signs may be absent or patient reported.   Time Spent with patient on telephone encounter: *** minutes   Review of Systems    ***       Objective:    There were no vitals filed for this visit. There is no height or weight on file to calculate BMI.  Advanced Directives 01/16/2020 01/16/2020 10/16/2019 09/23/2019 08/09/2019 08/09/2019 06/22/2019  Does Patient Have a Medical Advance Directive? No Unable to assess, patient is non-responsive or altered mental status No No No No No  Type of Advance Directive - - - - - - -  Does patient want to make changes to medical advance directive? - - - - - - -  Copy of Dickinson in Chart? - - - - - - -  Would patient like information on creating a medical advance directive? No - Guardian declined - No - Patient declined No - Patient declined No - Patient declined No - Patient declined -  Pre-existing out of facility DNR order (yellow form or pink MOST form) - - - - - - -     Current Medications (verified) Outpatient Encounter Medications as of 06/21/2020  Medication Sig  . alum & mag hydroxide-simeth (MAALOX/MYLANTA) 200-200-20 MG/5ML suspension Take 15 mLs by mouth every 6 (six) hours as needed for indigestion or heartburn.  [name] amLODipine (NORVASC) 10 MG tablet Take 1 tablet (10 mg total) by mouth daily.  [name] apixaban (ELIQUIS) 5 MG TABS tablet Take 5 mg by mouth 2 (two) times daily.  [name] aspirin 81 MG EC tablet Take 1 tablet (81 mg total) by mouth daily.  [name] atorvastatin (LIPITOR) 80 MG tablet Take 1 tablet (80 mg total) by mouth daily at 6 PM. (Patient taking differently: Take 80 mg by mouth daily. )  . clopidogrel (PLAVIX) 75 MG tablet Take 75 mg by mouth daily.  [name] docusate sodium (COLACE) 100 MG capsule Take 100 mg by mouth daily.  . furosemide (LASIX) 20 MG tablet Take 1 tablet (20 mg total) by mouth daily.  [name] gabapentin (NEURONTIN) 300 MG capsule Take 600 mg by mouth 2 (two) times daily.   . Insulin Glargine (BASAGLAR KWIKPEN) 100 UNIT/ML Inject 0.55 mLs (55 Units total) into the skin 2 (two) times daily.  . insulin lispro (HUMALOG) 100 UNIT/ML injection Inject 5-18 Units into the skin See admin instructions. units three times daily before meals Sliding Scale 150-250 = 5 units 251-350= 6-8 units 351-450= 10 units Over 450 18 units  . metFORMIN (GLUCOPHAGE-XR) 500 MG 24 hr tablet Take 500 mg by mouth daily.  [name]  metoprolol tartrate (LOPRESSOR) 25 MG tablet Take 1 tablet (25 mg total) by mouth 2 (two) times daily.  . nitroGLYCERIN (NITROSTAT) 0.4 MG SL tablet Place 1 tablet (0.4 mg total) under the tongue every 5 (five) minutes as needed for chest pain.  Letta Pate VERIO test strip Use to test blood sugar twice daily  . pantoprazole (PROTONIX) 40 MG tablet Take 1 tablet (40 mg total) by mouth 2 (two) times daily before a meal.  . promethazine (PHENERGAN) 25 MG tablet Take 25-50 mg by mouth every 6 (six) hours as needed for nausea or vomiting.   . ranolazine  (RANEXA) 500 MG 12 hr tablet Take 1 tablet (500 mg total) by mouth 2 (two) times daily.  . traMADol (ULTRAM) 50 MG tablet Take 100 mg by mouth every 6 (six) hours as needed for moderate pain.    No facility-administered encounter medications on file as of 06/21/2020.    Allergies (verified) Gabapentin, Relafen [nabumetone], and Codeine   History: Past Medical History:  Diagnosis Date  . Anxiety   . Arthritis    "all over"   . CAD (coronary artery disease)   . Charcot's joint    "left foot"  . 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 Depression   . Gastroparesis   . GERD (gastroesophageal reflux disease)   . H/O hiatal hernia   . Hyperlipidemia   . Hypertension   . Myocardial infarction (HCC) 2017/03/27   around this date  . OSA (obstructive sleep apnea)    "not bad enough for a mask"  . Peripheral neuropathy   . Peripheral vascular disease (HCC)   . PONV (postoperative nausea and vomiting)   . Pulmonary embolism (HCC)    hx. of 2012  . Shortness of breath    exertion  . Type II diabetes mellitus (HCC)    Past Surgical History:  Procedure Laterality Date  . AMPUTATION Left 03/08/2015   Procedure:  LEFT AMPUTATION BELOW KNEE;  Surgeon: Toni Arthurs, MD;  Location: [facility];  Service: Orthopedics;  Laterality: Left;  . APPLICATION OF WOUND VAC Left 01/07/2014  . CHOLECYSTECTOMY  06/2010  . CORONARY ANGIOPLASTY WITH STENT PLACEMENT  05/2009   "1"  . FOOT SURGERY Left 2010   "for Charcot's joint"  . HARDWARE REMOVAL Left 01/07/2014   Procedure: LEFT LEG REMOVAL OF DEEP IMPLANT AND SEQUESTRECTOMY; APPLICATION OF WOUND VAC ;  Surgeon: Toni Arthurs, MD;  Location: [facility] OR;  Service: Orthopedics;  Laterality: Left;  . I & D EXTREMITY Left "multiple"   leg  . 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 IM NAILING TIBIA Left ~  2012  . LEFT HEART CATH AND CORONARY ANGIOGRAPHY N/A 04/08/2017   Procedure: LEFT HEART CATH AND CORONARY ANGIOGRAPHY;  Surgeon: Runell Gess, MD;  Location: [facility];  Service: Cardiovascular;  Laterality: N/A;  . SHOULDER ARTHROSCOPY W/ ROTATOR CUFF REPAIR Left    "and bone spurs"  . TIBIAL IM ROD REMOVAL Left 01/07/2014  . TOE AMPUTATION Right ~ 2011   "great toe"  . VENA CAVA FILTER PLACEMENT  2012  . VENA CAVA FILTER PLACEMENT N/A 11/20/2016   Procedure: INSERTION VENA-CAVA FILTER;  Surgeon: Sherren Kerns, MD;  Location: [facility] OR;  Service: Vascular;  Laterality: N/A;  . WOUND DEBRIDEMENT Left 01/07/2014   "tibia"  Family History  Problem Relation Age of Onset  . COPD Mother   . Heart disease Mother   . Lung cancer Mother   . Retinal detachment Father   . Alcoholism Brother   . Heart disease Brother   . COPD Brother   . Diabetes Brother   . Hypertension Brother   . Stroke Brother    Social History   Socioeconomic History  . Marital status: Divorced    Spouse name: Not on file  . Number of children: Not on file  . Years of education: 58  . Highest education level: Not on file  Occupational History  . Occupation: DISABLITY    Employer: UNEMPLOYED  Tobacco Use  . Smoking status: Never Smoker  . Smokeless tobacco: Never Used  Vaping Use  . Vaping Use: Never used  Substance and Sexual Activity  . Alcohol use: No    Comment: 01/07/2014 'might have a wine cooler a couple times/yr"  . Drug use: No  . Sexual activity: Never  Other Topics Concern  . Not on file  Social History Narrative  . Not on file   Social Determinants of Health   Financial Resource Strain: Not on file  Food Insecurity: Not on file  Transportation Needs: Not on file  Physical Activity: Not on file  Stress: Not on file  Social Connections: Not on file    Tobacco Counseling Counseling given: Not Answered   Clinical Intake:                 Diabetes:  Is the  patient diabetic?  Yes  If diabetic, was a CBG obtained today?  No  Did the patient bring in their glucometer from home?  No phone visit How often do you monitor your CBG's? ***.   Financial Strains and Diabetes Management:  Are you having any financial strains with the device, your supplies or your medication? {YES/NO:21197}.  Does the patient want to be seen by Chronic Care Management for management of their diabetes?  {YES/NO:21197} Would the patient like to be referred to a Nutritionist or for Diabetic Management?  {YES/NO:21197}  Diabetic Exams:  Diabetic Eye Exam: Completed ***. Overdue for diabetic eye exam. Pt has been advised about the importance in completing this exam. A referral has been placed today. Message sent to referral coordinator for scheduling purposes. Advised pt to expect a call from our office re: appt.  Diabetic Foot Exam: Completed ***. Pt has been advised about the importance in completing this exam. Pt is scheduled for diabetic foot exam on ***.            Activities of Daily Living In your present state of health, do you have any difficulty performing the following activities: 09/23/2019  Hearing? N  Vision? N  Difficulty concentrating or making decisions? N  Walking or climbing stairs? Y  Dressing or bathing? Y  Doing errands, shopping? Y  Some recent data might be hidden    Patient Care Team: Mliss Sax, MD as PCP - General (Family Medicine) Cliffton Asters, MD as Consulting Physician (Infectious Diseases) [facility] as Referring Physician (Pharmacist) Toni Arthurs, MD as Consulting Physician (Orthopedic Surgery)  Indicate any recent Medical Services you may have received from other than [facility] providers in the past year (date may be approximate).     Assessment:   This is a routine wellness examination for Tage.  Hearing/Vision screen No exam data present  Dietary issues and exercise activities discussed:  Goals    None    Depression Screen PHQ 2/9 Scores 04/11/2020 04/11/2020 07/28/2018 06/03/2018 09/25/2017 06/14/2017 04/19/2017  PHQ - 2 Score 0 0 1 1 0 0 0  PHQ- 9 Score 9 - - - - - -    Fall Risk Fall Risk  04/11/2020 07/28/2018 09/25/2017 08/27/2017 08/08/2017  Falls in the past year? 0 0 No (No Data) (No Data)  Comment - - - Patient denies new/ recent falls Patient denies new/ recent falls  Number falls in past yr: - - - - -  Injury with Fall? - - - - -  Risk Factor Category  - - - - -  Risk for fall due to : - - - - -  Follow up - - - - -    FALL RISK PREVENTION PERTAINING TO THE HOME:  Any stairs in or around the home? {YES/NO:21197} If so, are there any without handrails? {YES/NO:21197} Home free of loose throw rugs in walkways, pet beds, electrical cords, etc? {YES/NO:21197} Adequate lighting in your home to reduce risk of falls? {YES/NO:21197}  ASSISTIVE DEVICES UTILIZED TO PREVENT FALLS:  Life alert? {YES/NO:21197} Use of a cane, walker or w/c? {YES/NO:21197} Grab bars in the bathroom? {YES/NO:21197} Shower chair or bench in shower? {YES/NO:21197} Elevated toilet seat or a handicapped toilet? {YES/NO:21197}  TIMED UP AND GO:  Was the test performed? {YES/NO:21197}.  Length of time to ambulate 10 feet: *** sec.   {Appearance of HALP:3790240}  Cognitive Function:        Immunizations Immunization History  Administered Date(s) Administered  . Fluad Quad(high Dose 65+) 04/11/2020  . Influenza,inj,Quad PF,6+ Mos 03/02/2014  . Moderna Sars-Covid-2 Vaccination 08/26/2019, 09/23/2019  . PPD Test 03/15/2015    TDAP status: Up to date  Flu Vaccine status: Up to date  {Pneumococcal vaccine status:2101807}  {Covid-19 vaccine status:2101808}  Qualifies for Shingles Vaccine? Yes   Zostavax completed No   Shingrix Completed?: No.    Education has been provided regarding the importance of this vaccine. Patient has been advised to call insurance company to determine out of  pocket expense if they have not yet received this vaccine. Advised may also receive vaccine at local pharmacy or Health Dept. Verbalized acceptance and understanding.  Screening Tests Health Maintenance  Topic Date Due  . Hepatitis C Screening  Never done  . FOOT EXAM  Never done  . PNA vac Low Risk Adult (2 of 2 - PPSV23) 03/07/2019  . COVID-19 Vaccine (3 - Booster for Moderna series) 03/24/2020  . HEMOGLOBIN A1C  10/10/2020  . OPHTHALMOLOGY EXAM  10/20/2020  . URINE MICROALBUMIN  04/11/2021  . TETANUS/TDAP  02/07/2022  . COLONOSCOPY (Pts 45-12yrs Insurance coverage will need to be confirmed)  11/01/2022  . INFLUENZA VACCINE  Completed    Health Maintenance  Health Maintenance Due  Topic Date Due  . Hepatitis C Screening  Never done  . FOOT EXAM  Never done  . PNA vac Low Risk Adult (2 of 2 - PPSV23) 03/07/2019  . COVID-19 Vaccine (3 - Booster for Moderna series) 03/24/2020    Colorectal cancer screening: Type of screening: Colonoscopy. Completed 10/31/2012. Repeat every 10 years  Lung Cancer Screening: (Low Dose CT Chest recommended if Age 31-80 years, 30 pack-year currently smoking OR have quit w/in 15years.) does not qualify.    Additional Screening:  Hepatitis C Screening: does qualify; Phone visit-Discuss with PCP  Vision Screening: Recommended annual ophthalmology exams for early detection of glaucoma and other disorders of the  eye. Is the patient up to date with their annual eye exam?  {YES/NO:21197} Who is the provider or what is the name of the office in which the patient attends annual eye exams? *** If pt is not established with a provider, would they like to be referred to a provider to establish care? {YES/NO:21197}.   Dental Screening: Recommended annual dental exams for proper oral hygiene  Community Resource Referral / Chronic Care Management: CRR required this visit?  {YES/NO:21197}  CCM required this visit?  {YES/NO:21197}     Plan:     I have  personally reviewed and noted the following in the patient's chart:   . Medical and social history . Use of alcohol, tobacco or illicit drugs  . Current medications and supplements . Functional ability and status . Nutritional status . Physical activity . Advanced directives . List of other physicians . Hospitalizations, surgeries, and ER visits in previous 12 months . Vitals . Screenings to include cognitive, depression, and falls . Referrals and appointments  In addition, I have reviewed and discussed with patient certain preventive protocols, quality metrics, and best practice recommendations. A written personalized care plan for preventive services as well as general preventive health recommendations were provided to patient.   Due to this being a telephonic visit, the after visit summary with patients personalized plan was offered to patient via mail or my-chart. ***Patient declined at this time./ Patient would like to access on my-chart/ per request, patient was mailed a copy of AVS./ Patient preferred to pick up at office at next visit.   Roanna Raider, LPN   10/17/7614  Nurse Health Advisor  Nurse Notes: ***

## 2020-06-22 ENCOUNTER — Other Ambulatory Visit: Payer: Self-pay

## 2020-06-22 ENCOUNTER — Encounter: Payer: Self-pay | Admitting: Internal Medicine

## 2020-06-22 ENCOUNTER — Ambulatory Visit (INDEPENDENT_AMBULATORY_CARE_PROVIDER_SITE_OTHER): Payer: HMO | Admitting: Internal Medicine

## 2020-06-22 VITALS — BP 134/78 | HR 84

## 2020-06-22 DIAGNOSIS — E1159 Type 2 diabetes mellitus with other circulatory complications: Secondary | ICD-10-CM

## 2020-06-22 DIAGNOSIS — Z89512 Acquired absence of left leg below knee: Secondary | ICD-10-CM | POA: Diagnosis not present

## 2020-06-22 DIAGNOSIS — Z794 Long term (current) use of insulin: Secondary | ICD-10-CM | POA: Diagnosis not present

## 2020-06-22 DIAGNOSIS — E1165 Type 2 diabetes mellitus with hyperglycemia: Secondary | ICD-10-CM

## 2020-06-22 DIAGNOSIS — E119 Type 2 diabetes mellitus without complications: Secondary | ICD-10-CM | POA: Insufficient documentation

## 2020-06-22 DIAGNOSIS — E1140 Type 2 diabetes mellitus with diabetic neuropathy, unspecified: Secondary | ICD-10-CM

## 2020-06-22 LAB — POCT GLYCOSYLATED HEMOGLOBIN (HGB A1C): Hemoglobin A1C: 12 % — AB (ref 4.0–5.6)

## 2020-06-22 MED ORDER — TRESIBA FLEXTOUCH 200 UNIT/ML ~~LOC~~ SOPN: 100.0000 [IU] | PEN_INJECTOR | Freq: Every day | SUBCUTANEOUS | 11 refills | Status: AC

## 2020-06-22 MED ORDER — NOVOLOG FLEXPEN 100 UNIT/ML ~~LOC~~ SOPN: 20.0000 [IU] | PEN_INJECTOR | Freq: Three times a day (TID) | SUBCUTANEOUS | 11 refills | Status: AC

## 2020-06-22 NOTE — Patient Instructions (Addendum)
-   STOP BASAGLAR - STOP Metformin  - Humalog/Novolog 20 units with each meal  - Start Tresiba 100 units ONCE DAILY  - Continue Acarbose 50 mg, 1 tablet three times a day    - Try and check your sugar before each meal    HOW TO TREAT LOW BLOOD SUGARS (Blood sugar LESS THAN 70 MG/DL)  Please follow the RULE OF 15 for the treatment of hypoglycemia treatment (when your (blood sugars are less than 70 mg/dL)    STEP 1: Take 15 grams of carbohydrates when your blood sugar is low, which includes:   3-4 GLUCOSE TABS  OR  3-4 OZ OF JUICE OR REGULAR SODA OR  ONE TUBE OF GLUCOSE GEL     STEP 2: RECHECK blood sugar in 15 MINUTES STEP 3: If your blood sugar is still low at the 15 minute recheck --> then, go back to STEP 1 and treat AGAIN with another 15 grams of carbohydrates.

## 2020-06-22 NOTE — Progress Notes (Signed)
Name: Samuel [name]  MRN/ DOB: [id number], Jun 15, 1951   Age/ Sex: 70 y.o., male    PCP: Libby Maw, MD   Reason for Endocrinology Evaluation: Type 2 Diabetes Mellitus     Date of Initial Endocrinology Visit: 06/22/2020     PATIENT IDENTIFIER: [name] is a 70 y.o. male with a past medical history of DM, CAD , HTN and dyslipidemia. The patient presented for initial endocrinology clinic visit on 06/22/2020 for consultative assistance with his diabetes management.    HPI: Samuel [name] is accompanied by cousin  Samuel [name]    Diagnosed with DM at age 55 Prior Medications tried/Intolerance: has been on insulin a few years after diagnosis Currently checking blood sugars occasionally  Hypoglycemia episodes : no             Hemoglobin A1c has ranged from 6.4% in 2011, peaking at 13.1% in 2021. Patient required assistance for hypoglycemia: no Patient has required hospitalization within the last 1 year from hyper or hypoglycemia: DKA 12/2019 and 09/2019   In terms of diet, the patient eats 1 meal a day, snacks during the day, drinks sodas   He uses basaglar sparingly through pt assistance program    Eyes- Dr. Chrystie Nose. Zigmund Daniel Cardio- Dr.  Quay Burow  Pulm. Dr. Baltazar Apo      HOME DIABETES REGIMEN: Basaglar 80 units BID  Acarbose 50 mg TID  Metformin 500 mg XR 1 tablet BID  Humalog from time to time when he thinks about it      Statin: yes ACE-I/ARB: no Prior Diabetic Education: yes   METER DOWNLOAD SUMMARY: Date range evaluated: 06/08/2020- 06/22/2020 Average Number Tests/Day = 0.3 Overall Mean FS Glucose = 308 Standard Deviation = 142  BG Ranges: Low = 136 High = 590   Hypoglycemic Events/30 Days: BG < 50 = 0 Episodes of symptomatic severe hypoglycemia = 0   DIABETIC COMPLICATIONS: Microvascular complications:   Left BKA, neuropathy   Denies: CKD, retinopathy   Last eye exam: Completed over a year ago   Macrovascular  complications:   CAD ( S/P stent placement) /PVD  Denies: CVA   PAST HISTORY: Past Medical History:  Past Medical History:  Diagnosis Date  . Anxiety   . Arthritis    "all over"   . CAD (coronary artery disease)   . Charcot's joint    "left foot"  . 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 Depression   . Gastroparesis   . GERD (gastroesophageal reflux disease)   . H/O hiatal hernia   . Hyperlipidemia   . Hypertension   . Myocardial infarction ([place]) 2017/03/27   around this date  . OSA (obstructive sleep apnea)    "not bad enough for a mask"  . Peripheral neuropathy   . Peripheral vascular disease ([place])   . PONV (postoperative nausea and vomiting)   . Pulmonary embolism ([place])    hx. of 2012  . Shortness of breath    exertion  . Type II diabetes mellitus ([place])    Past Surgical History:  Past Surgical History:  Procedure Laterality Date  . AMPUTATION Left 03/08/2015   Procedure:  LEFT AMPUTATION BELOW KNEE;  Surgeon: Wylene Simmer, MD;  Location: [facility];  Service: Orthopedics;  Laterality: Left;  . APPLICATION OF WOUND VAC Left 01/07/2014  . CHOLECYSTECTOMY  06/2010  . CORONARY ANGIOPLASTY WITH STENT PLACEMENT  05/2009   "1"  . FOOT SURGERY Left 2010   "  for Charcot's joint"  . HARDWARE REMOVAL Left 01/07/2014   Procedure: LEFT LEG REMOVAL OF DEEP IMPLANT AND SEQUESTRECTOMY; APPLICATION OF WOUND VAC ;  Surgeon: Toni Arthurs, MD;  Location: [facility] OR;  Service: Orthopedics;  Laterality: Left;  . I & D EXTREMITY Left "multiple"   leg  . 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 IM NAILING TIBIA Left ~ 2012  . LEFT HEART CATH AND CORONARY ANGIOGRAPHY N/A 04/08/2017   Procedure: LEFT HEART CATH AND CORONARY ANGIOGRAPHY;  Surgeon: Runell Gess, MD;  Location: [facility]IVE [facility];  Service: Cardiovascular;   Laterality: N/A;  . SHOULDER ARTHROSCOPY W/ ROTATOR CUFF REPAIR Left    "and bone spurs"  . TIBIAL IM ROD REMOVAL Left 01/07/2014  . TOE AMPUTATION Right ~ 2011   "great toe"  . VENA CAVA FILTER PLACEMENT  2012  . VENA CAVA FILTER PLACEMENT N/A 11/20/2016   Procedure: INSERTION VENA-CAVA FILTER;  Surgeon: Sherren Kerns, MD;  Location: [facility];  Service: Vascular;  Laterality: N/A;  . WOUND DEBRIDEMENT Left 01/07/2014   "tibia"      Social History:  reports that he has never smoked. He has never used smokeless tobacco. He reports that he does not drink alcohol and does not use drugs. Family History:  Family History  Problem Relation Age of Onset  . COPD Mother   . Heart disease Mother   . Lung cancer Mother   . Retinal detachment Father   . Alcoholism Brother   . Heart disease Brother   . COPD Brother   . Diabetes Brother   . Hypertension Brother   . Stroke Brother      HOME MEDICATIONS: Allergies as of 06/22/2020      Reactions   Gabapentin Hives, Itching, Nausea And Vomiting, Rash   REACTION: rash/hives (per patient, was a reaction to an inactive ingredient in another mgf brand). The patient stated that he does take this medication now and it doesn't cause a rash any more.   Relafen [nabumetone] Nausea And Vomiting, Rash   Codeine Nausea And Vomiting, Rash      Medication List       Accurate as of June 22, 2020  3:08 PM. If you have any questions, ask your nurse or doctor.        alum & mag hydroxide-simeth 200-200-20 MG/5ML suspension Commonly known as: MAALOX/MYLANTA Take 15 mLs by mouth every 6 (six) hours as needed for indigestion or heartburn.   amLODipine 10 MG tablet Commonly known as: NORVASC Take 1 tablet (10 mg total) by mouth daily.   apixaban 5 MG Tabs tablet Commonly known as: ELIQUIS Take 5 mg by mouth 2 (two) times daily.   aspirin 81 MG EC tablet Take 1 tablet (81 mg total) by mouth daily.   atorvastatin 80 MG tablet Commonly known as:  LIPITOR Take 1 tablet (80 mg total) by mouth daily at 6 PM. What changed: when to take this   Basaglar KwikPen 100 UNIT/ML Inject 0.55 mLs (55 Units total) into the skin 2 (two) times daily.   clopidogrel 75 MG tablet Commonly known as: PLAVIX Take 75 mg by mouth daily.   docusate sodium 100 MG capsule Commonly known as: COLACE Take 100 mg by mouth daily.   furosemide 20 MG tablet Commonly known as: LASIX Take 1 tablet (20 mg  total) by mouth daily.   gabapentin 300 MG capsule Commonly known as: NEURONTIN Take 600 mg by mouth 2 (two) times daily.   insulin lispro 100 UNIT/ML injection Commonly known as: HUMALOG Inject 5-18 Units into the skin See admin instructions. units three times daily before meals Sliding Scale 150-250 = 5 units 251-350= 6-8 units 351-450= 10 units Over 450 18 units   metFORMIN 500 MG 24 hr tablet Commonly known as: GLUCOPHAGE-XR Take 500 mg by mouth daily.   metoprolol tartrate 25 MG tablet Commonly known as: LOPRESSOR Take 1 tablet (25 mg total) by mouth 2 (two) times daily.   nitroGLYCERIN 0.4 MG SL tablet Commonly known as: NITROSTAT Place 1 tablet (0.4 mg total) under the tongue every 5 (five) minutes as needed for chest pain.   OneTouch Verio test strip Generic drug: glucose blood Use to test blood sugar twice daily   pantoprazole 40 MG tablet Commonly known as: PROTONIX Take 1 tablet (40 mg total) by mouth 2 (two) times daily before a meal.   promethazine 25 MG tablet Commonly known as: PHENERGAN Take 25-50 mg by mouth every 6 (six) hours as needed for nausea or vomiting.   ranolazine 500 MG 12 hr tablet Commonly known as: RANEXA Take 1 tablet (500 mg total) by mouth 2 (two) times daily.   traMADol 50 MG tablet Commonly known as: ULTRAM Take 100 mg by mouth every 6 (six) hours as needed for moderate pain.        ALLERGIES: Allergies  Allergen Reactions  . Gabapentin Hives, Itching, Nausea And Vomiting and Rash     REACTION: rash/hives (per patient, was a reaction to an inactive ingredient in another mgf brand). The patient stated that he does take this medication now and it doesn't cause a rash any more.  . Relafen [Nabumetone] Nausea And Vomiting and Rash  . Codeine Nausea And Vomiting and Rash     REVIEW OF SYSTEMS: A comprehensive ROS was conducted with the patient and is negative except as per HPI    OBJECTIVE:   VITAL SIGNS: BP 134/78   Pulse 84   SpO2 97%    PHYSICAL EXAM:  General: Pt appears well and is in NAD  Neck: General: Supple without adenopathy or carotid bruits. Thyroid: Thyroid size normal.  No goiter or nodules appreciated.   Lungs: Clear with good BS bilat with no rales, rhonchi, or wheezes  Heart: RRR   Abdomen: Normoactive bowel sounds, soft, nontender, without masses or organomegaly palpable  Extremities:  Lower extremities - Trace edema on the right, Left BKA  Neuro: MS is good with appropriate affect, pt is alert and Ox3     DATA REVIEWED:  Lab Results  Component Value Date   HGBA1C 12.0 (A) 06/22/2020   HGBA1C 13.1 (H) 04/11/2020   HGBA1C 8.5 (H) 01/16/2020   Lab Results  Component Value Date   MICROALBUR 36.0 (H) 04/11/2020   LDLCALC 94 08/17/2018   CREATININE 0.92 04/11/2020   Lab Results  Component Value Date   MICRALBCREAT 34.9 (H) 04/11/2020    Lab Results  Component Value Date   CHOL 156 04/11/2020   HDL 31.20 (L) 04/11/2020   LDLCALC 94 08/17/2018   LDLDIRECT 101.0 04/11/2020   TRIG 225.0 (H) 04/11/2020   CHOLHDL 5 04/11/2020        ASSESSMENT / PLAN / RECOMMENDATIONS:   1) Type 2 Diabetes Mellitus, Poorly controlled, With gastroparesis neuropathic and macrovascular  complications  With Left BKA- Most recent A1c  of 12.0 %. Goal A1c < 7.0 %.   Plan: GENERAL: I have discussed with the patient the pathophysiology of diabetes. We went over the natural progression of the disease. We talked about both insulin resistance and insulin  deficiency. We stressed the importance of lifestyle changes including diet and exercise. I explained the complications associated with diabetes including retinopathy, nephropathy, neuropathy as well as increased risk of cardiovascular disease. We went over the benefit seen with glycemic control.    I explained to the patient that diabetic patients are at higher than normal risk for amputations.  Discussed pharmacokinetics of basal/bolus insulin and the importance of taking prandial insulin with meals.   We also discussed avoiding sugar-sweetened beverages and snacks, when possible.   He is on pt assistance program for Basaglar and Humalog, he does not use Humalog much so has plenty at home, he has been rationing basaglar as they " never send enough "  Will try sending [place] and Novolog/Humalog through his insurance   He was encouraged to communicate with [place] about coverage issues   I will stop his metformin , as he has chronic nausea , most likely due to gastropresis but would like to remove any offending drugs that would exacerbate his symptoms   He is not a good candidate for GLP-1 agonists due to gastroparesis   I will check him for T1DM antibodies, if negative will consider SGLT-2 inhibitors   MEDICATIONS:  - STOP BASAGLAR - STOP Metformin  - Humalog/Novolog 20 units with each meal  - Start Tresiba 100 units ONCE DAILY  - Continue Acarbose 50 mg, 1 tablet three times a day    EDUCATION / INSTRUCTIONS:  BG monitoring instructions: Patient is instructed to check his blood sugars 3 times a day, before meals.  Call [facility] if: BG persistently < 70 . I reviewed the Rule of 15 for the treatment of hypoglycemia in detail with the patient. Literature supplied.   2) Diabetic complications:   Eye: Does not have known diabetic retinopathy.   Neuro/ Feet: Does  have known diabetic peripheral neuropathy.  Renal: Patient does not have known baseline CKD. He is  not on an ACEI/ARB at present.    I spent 45 minutes preparing to see the patient by review of recent labs, imaging and procedures, obtaining and reviewing separately obtained history, communicating with the patient/family or caregiver, ordering medications, tests or procedures, and documenting clinical information in the EHR including the differential Dx, treatment, and any further evaluation and other management   Signed electronically by: Lyndle Herrlich, MD  [facility]  [facility] [address] [place] [place], [place] [address] Phone: [phone] FAX: [phone]   CC: Mliss Sax, MD [address] [place] [address] Phone: [phone]  Fax: [phone]    Return to Endocrinology clinic as below: Future Appointments  Date Time Provider Department Center  08/12/2020  1:00 PM Mliss Sax, MD [facility]

## 2020-06-23 ENCOUNTER — Telehealth: Payer: Self-pay

## 2020-06-23 NOTE — Telephone Encounter (Signed)
Patient calling back to inform office of phone # to [facility] [phone]. Person to ask for, Camila Li.  Pt does not want to ride the big bus but would like to ride the smaller bus,  [facility] [facility], this will take pt directly to Dr. Isidore Moos visit and take pt back home.  Please advise.

## 2020-06-23 NOTE — Telephone Encounter (Signed)
Received a fax from [employer] for a signature on a letter of neccessity for prosthetics & Orthotics.   Patient has already gotten the supplies needed they just need it signed to be used as a RX and put into their system.  Advised that provider would be back in the office next week and okay to wait till then. Form placed on [place];s desk.  Dm/cma

## 2020-06-30 NOTE — Telephone Encounter (Signed)
I LDM for Warnell Forester to call office back, so we could know what is needed for patient's  request.

## 2020-07-01 NOTE — Telephone Encounter (Signed)
I spoke with Samuel [name], she was returning my call.  She informed me that she is working from home today and did not have Samuel [name]'s chart with her.  She will call back on Tuesday to update office of what pt is needing for the transportation requested.

## 2020-07-11 DIAGNOSIS — Z89512 Acquired absence of left leg below knee: Secondary | ICD-10-CM | POA: Diagnosis not present

## 2020-07-13 DIAGNOSIS — Z89512 Acquired absence of left leg below knee: Secondary | ICD-10-CM | POA: Diagnosis not present

## 2020-07-29 ENCOUNTER — Ambulatory Visit: Payer: HMO | Admitting: Dietician

## 2020-08-02 ENCOUNTER — Telehealth: Payer: Self-pay | Admitting: Family Medicine

## 2020-08-02 NOTE — Telephone Encounter (Signed)
Samuel [name]-[employer] is calling to get a prescription for a standard wheelchair (K0001 CPT code). Please call her at [phone] if you have any questions. She is aware that provider is out of the office, but wants to know if this is something that can be handled by another provider. She said she will need an answer by today or at least by tomorrow at 10am.  Fax #: [phone]

## 2020-08-03 NOTE — Telephone Encounter (Signed)
Amy calling today in regards to the order for patients wheelchair.  Amy is asking for a new rx for the wheelchair.  Patient's previous provider before Dr. Doreene Burke has retired and the patient was new to Dr. Doreene Burke. [employer] would like to continue with patient's rx for the wheelchair but have to have a new rx for it.  Amy from [employer] is aware the pt's provider is out on leave, but is asking for another provider to prescribe the wheelchair for pt.  I informed Amy that I would sent message back to the provider of the day.  Please advise.

## 2020-08-03 NOTE — Telephone Encounter (Signed)
Ok to write DME order for pts wheelchair with code as provided

## 2020-08-08 NOTE — Telephone Encounter (Signed)
Returned Samuel [name]'s call regarding order for wheelchair for patient. No answer LMTCB

## 2020-08-10 NOTE — Addendum Note (Signed)
Addended by: Lake Bells on: 08/10/2020 04:29 PM   Modules accepted: Orders

## 2020-08-11 NOTE — Telephone Encounter (Signed)
Still unable to reach Samuel [name] requested order faxed to provided number.

## 2020-08-12 ENCOUNTER — Ambulatory Visit: Payer: PPO | Admitting: Family Medicine

## 2020-08-13 DIAGNOSIS — Z89512 Acquired absence of left leg below knee: Secondary | ICD-10-CM | POA: Diagnosis not present

## 2020-08-16 ENCOUNTER — Other Ambulatory Visit: Payer: Self-pay | Admitting: Family Medicine

## 2020-08-18 NOTE — Telephone Encounter (Signed)
Called and left message to give the office a call back and schedule an appointment.

## 2020-08-31 ENCOUNTER — Other Ambulatory Visit: Payer: Self-pay

## 2020-09-01 ENCOUNTER — Ambulatory Visit (INDEPENDENT_AMBULATORY_CARE_PROVIDER_SITE_OTHER): Payer: HMO | Admitting: Family Medicine

## 2020-09-01 ENCOUNTER — Encounter: Payer: Self-pay | Admitting: Family Medicine

## 2020-09-01 VITALS — BP 124/84 | HR 67 | Temp 97.0°F | Ht 75.0 in | Wt 234.0 lb

## 2020-09-01 DIAGNOSIS — J449 Chronic obstructive pulmonary disease, unspecified: Secondary | ICD-10-CM

## 2020-09-01 DIAGNOSIS — I214 Non-ST elevation (NSTEMI) myocardial infarction: Secondary | ICD-10-CM | POA: Diagnosis not present

## 2020-09-01 DIAGNOSIS — Z89512 Acquired absence of left leg below knee: Secondary | ICD-10-CM

## 2020-09-01 DIAGNOSIS — R112 Nausea with vomiting, unspecified: Secondary | ICD-10-CM

## 2020-09-01 DIAGNOSIS — H6122 Impacted cerumen, left ear: Secondary | ICD-10-CM | POA: Diagnosis not present

## 2020-09-01 DIAGNOSIS — L03115 Cellulitis of right lower limb: Secondary | ICD-10-CM | POA: Insufficient documentation

## 2020-09-01 DIAGNOSIS — I2782 Chronic pulmonary embolism: Secondary | ICD-10-CM | POA: Diagnosis not present

## 2020-09-01 LAB — LIPID PANEL
Cholesterol: 167 mg/dL (ref 0–200)
HDL: 38.6 mg/dL — ABNORMAL LOW (ref >39.00)
LDL Cholesterol: 101 mg/dL — ABNORMAL HIGH (ref 0–99)
NonHDL: 128.66
Total CHOL/HDL Ratio: 4
Triglycerides: 136 mg/dL (ref 0.0–149.0)
VLDL: 27.2 mg/dL (ref 0.0–40.0)

## 2020-09-01 LAB — COMPREHENSIVE METABOLIC PANEL
ALT: 17 U/L (ref 0–53)
AST: 14 U/L (ref 0–37)
Albumin: 3.6 g/dL (ref 3.5–5.2)
Alkaline Phosphatase: 78 U/L (ref 39–117)
BUN: 19 mg/dL (ref 6–23)
CO2: 27 mEq/L (ref 19–32)
Calcium: 9.3 mg/dL (ref 8.4–10.5)
Chloride: 96 mEq/L (ref 96–112)
Creatinine, Ser: 0.85 mg/dL (ref 0.40–1.50)
GFR: 89.92 mL/min (ref >60.00)
Glucose, Bld: 333 mg/dL — ABNORMAL HIGH (ref 70–99)
Potassium: 4.3 mEq/L (ref 3.5–5.1)
Sodium: 132 mEq/L — ABNORMAL LOW (ref 135–145)
Total Bilirubin: 0.4 mg/dL (ref 0.2–1.2)
Total Protein: 6.8 g/dL (ref 6.0–8.3)

## 2020-09-01 LAB — CBC
HCT: 41.8 % (ref 39.0–52.0)
Hemoglobin: 14.2 g/dL (ref 13.0–17.0)
MCHC: 34 g/dL (ref 30.0–36.0)
MCV: 89.9 fl (ref 78.0–100.0)
Platelets: 141 10*3/uL — ABNORMAL LOW (ref 150.0–400.0)
RBC: 4.65 Mil/uL (ref 4.22–5.81)
RDW: 13 % (ref 11.5–15.5)
WBC: 6.1 10*3/uL (ref 4.0–10.5)

## 2020-09-01 MED ORDER — CEPHALEXIN 500 MG PO CAPS
500.0000 mg | ORAL_CAPSULE | Freq: Three times a day (TID) | ORAL | 0 refills | Status: AC
Start: 2020-09-01 — End: 2020-09-15

## 2020-09-01 NOTE — Progress Notes (Addendum)
Established Patient Office Visit  Subjective:  Patient ID: Samuel [name], male    DOB: 29-Aug-1950  Age: 70 y.o. MRN: [id number]  CC:  Chief Complaint  Patient presents with  . Follow-up    4 month follow up, in regards of last visit, pt states he also, has several things he'd like to discuss     HPI Samuel [name] presents for follow-up of multiple issues and concerns.  For the last week or so he has had a spreading erythematous rash over his right shin.  There is been swelling in the leg.  He has run no fever or chills.  Past medical history of serious gastroparesis.  It has been responding to Phenergan only.  He deals with chronic nausea with some vomiting.  He has Phenergan pills and suppositories.  Injectable seems to help the most.  He is seeing Dr. Matthias Hughs for this issue in the past and is planning on making his own appointment.  History of COPD with chronic extensive PE.  He is followed by pulmonology Dr. Ortencia Kick.  History of coronary artery disease and peripheral vascular disease.  Dr. Gery Pray has followed this for him in the past.  He has been lost to follow-up from all 3 of these physicians for some time now.  He is status post left-sided BKA associated with an osteomyelitis.  He has weakness in the left leg and is in need of physical therapy so that he can start using his prosthesis.  He is currently wheelchair-bound.  He has been able to take his atorvastatin at full dose of 80 mg.  He is fasting today.  He is complaining of congestion in his left ear.  Past Medical History:  Diagnosis Date  . Anxiety   . Arthritis    "all over"   . CAD (coronary artery disease)   . Charcot's joint    "left foot"  . 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 Depression   . Gastroparesis   . GERD (gastroesophageal reflux disease)   . H/O hiatal hernia   . Hyperlipidemia   . Hypertension   . Myocardial infarction (HCC) 2017/03/27   around this date  . OSA (obstructive sleep  apnea)    "not bad enough for a mask"  . Peripheral neuropathy   . Peripheral vascular disease (HCC)   . PONV (postoperative nausea and vomiting)   . Pulmonary embolism (HCC)    hx. of 2012  . Shortness of breath    exertion  . Type II diabetes mellitus (HCC)     Past Surgical History:  Procedure Laterality Date  . AMPUTATION Left 03/08/2015   Procedure:  LEFT AMPUTATION BELOW KNEE;  Surgeon: Toni Arthurs, MD;  Location: [facility];  Service: Orthopedics;  Laterality: Left;  . APPLICATION OF WOUND VAC Left 01/07/2014  . CHOLECYSTECTOMY  06/2010  . CORONARY ANGIOPLASTY WITH STENT PLACEMENT  05/2009   "1"  . FOOT SURGERY Left 2010   "for Charcot's joint"  . HARDWARE REMOVAL Left 01/07/2014   Procedure: LEFT LEG REMOVAL OF DEEP IMPLANT AND SEQUESTRECTOMY; APPLICATION OF WOUND VAC ;  Surgeon: Toni Arthurs, MD;  Location: [facility] OR;  Service: Orthopedics;  Laterality: Left;  . I & D EXTREMITY Left "multiple"   leg  . 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 IM  NAILING TIBIA Left ~ 2012  . LEFT HEART CATH AND CORONARY ANGIOGRAPHY N/A 04/08/2017   Procedure: LEFT HEART CATH AND CORONARY ANGIOGRAPHY;  Surgeon: Runell Gess, MD;  Location: [facility];  Service: Cardiovascular;  Laterality: N/A;  . SHOULDER ARTHROSCOPY W/ ROTATOR CUFF REPAIR Left    "and bone spurs"  . TIBIAL IM ROD REMOVAL Left 01/07/2014  . TOE AMPUTATION Right ~ 2011   "great toe"  . VENA CAVA FILTER PLACEMENT  2012  . VENA CAVA FILTER PLACEMENT N/A 11/20/2016   Procedure: INSERTION VENA-CAVA FILTER;  Surgeon: Sherren Kerns, MD;  Location: [facility];  Service: Vascular;  Laterality: N/A;  . WOUND DEBRIDEMENT Left 01/07/2014   "tibia"    Family History  Problem Relation Age of Onset  . COPD Mother   . Heart disease Mother   . Lung cancer Mother   . Retinal detachment  Father   . Alcoholism Brother   . Heart disease Brother   . COPD Brother   . Diabetes Brother   . Hypertension Brother   . Stroke Brother     Social History   Socioeconomic History  . Marital status: Divorced    Spouse name: Not on file  . Number of children: Not on file  . Years of education: 93  . Highest education level: Not on file  Occupational History  . Occupation: DISABLITY    Employer: UNEMPLOYED  Tobacco Use  . Smoking status: Never Smoker  . Smokeless tobacco: Never Used  Vaping Use  . Vaping Use: Never used  Substance and Sexual Activity  . Alcohol use: No    Comment: 01/07/2014 'might have a wine cooler a couple times/yr"  . Drug use: No  . Sexual activity: Never  Other Topics Concern  . Not on file  Social History Narrative  . Not on file   Social Determinants of Health   Financial Resource Strain: Not on file  Food Insecurity: Not on file  Transportation Needs: Not on file  Physical Activity: Not on file  Stress: Not on file  Social Connections: Not on file  Intimate Partner Violence: Not on file    Outpatient Medications Prior to Visit  Medication Sig Dispense Refill  . acarbose (PRECOSE) 50 MG tablet Take 50 mg by mouth 3 (three) times daily with meals.    [name] alum & mag hydroxide-simeth (MAALOX/MYLANTA) 200-200-20 MG/5ML suspension Take 15 mLs by mouth every 6 (six) hours as needed for indigestion or heartburn. 355 mL 0  . amLODipine (NORVASC) 10 MG tablet Take 1 tablet (10 mg total) by mouth daily. 30 tablet 2  . apixaban (ELIQUIS) 5 MG TABS tablet Take 5 mg by mouth 2 (two) times daily.    [name] aspirin 81 MG EC tablet Take 1 tablet (81 mg total) by mouth daily. 30 tablet 0  . atorvastatin (LIPITOR) 80 MG tablet Take 1 tablet (80 mg total) by mouth daily at 6 PM. (Patient taking differently: Take 80 mg by mouth daily.) 30 tablet 0  . clopidogrel (PLAVIX) 75 MG tablet Take 75 mg by mouth daily.    [name] docusate sodium (COLACE) 100 MG capsule Take 100 mg  by mouth daily.    . furosemide (LASIX) 20 MG tablet Take 1 tablet (20 mg total) by mouth daily. 30 tablet 0  . gabapentin (NEURONTIN) 300 MG capsule Take 600 mg by mouth 2 (two) times daily.     . insulin aspart (NOVOLOG FLEXPEN) 100 UNIT/ML FlexPen Inject 20  Units into the skin 3 (three) times daily with meals. 15 mL 11  . insulin degludec (TRESIBA FLEXTOUCH) 200 UNIT/ML FlexTouch Pen Inject 100 Units into the skin daily. 15 mL 11  . metoprolol tartrate (LOPRESSOR) 25 MG tablet Take 1 tablet (25 mg total) by mouth 2 (two) times daily. 60 tablet 0  . nitroGLYCERIN (NITROSTAT) 0.4 MG SL tablet Place 1 tablet (0.4 mg total) under the tongue every 5 (five) minutes as needed for chest pain. 30 tablet 0  . ONETOUCH VERIO test strip Use to test blood sugar twice daily  4  . pantoprazole (PROTONIX) 40 MG tablet Take 1 tablet (40 mg total) by mouth 2 (two) times daily before a meal. 60 tablet 0  . promethazine (PHENERGAN) 25 MG tablet Take 25-50 mg by mouth every 6 (six) hours as needed for nausea or vomiting.   5  . traMADol (ULTRAM) 50 MG tablet Take 100 mg by mouth every 6 (six) hours as needed for moderate pain.     . ranolazine (RANEXA) 500 MG 12 hr tablet Take 1 tablet (500 mg total) by mouth 2 (two) times daily. (Patient not taking: Reported on 09/01/2020) 60 tablet 0   No facility-administered medications prior to visit.    Allergies  Allergen Reactions  . Gabapentin Hives, Itching, Nausea And Vomiting and Rash    REACTION: rash/hives (per patient, was a reaction to an inactive ingredient in another mgf brand). The patient stated that he does take this medication now and it doesn't cause a rash any more. LOW DOSE  . Relafen [Nabumetone] Nausea And Vomiting and Rash  . Codeine Nausea And Vomiting and Rash    ROS Review of Systems  Constitutional: Negative for chills, diaphoresis, fatigue, fever and unexpected weight change.  HENT: Positive for hearing loss.   Eyes: Negative for photophobia  and visual disturbance.  Respiratory: Negative for chest tightness and shortness of breath.   Cardiovascular: Positive for leg swelling. Negative for chest pain and palpitations.  Gastrointestinal: Positive for nausea. Negative for abdominal pain.  Endocrine: Negative for polyphagia and polyuria.  Genitourinary: Negative for difficulty urinating, frequency and urgency.  Musculoskeletal: Positive for gait problem.  Skin: Positive for color change and rash.  Neurological: Positive for weakness.  Hematological: Negative for adenopathy.  Psychiatric/Behavioral: Negative.       Objective:    Physical Exam Vitals and nursing note reviewed.  Constitutional:      General: He is not in acute distress.    Appearance: Normal appearance. He is not ill-appearing, toxic-appearing or diaphoretic.  HENT:     Head: Normocephalic and atraumatic.     Left Ear: There is impacted cerumen.  Eyes:     Extraocular Movements: Extraocular movements intact.     Conjunctiva/sclera: Conjunctivae normal.     Pupils: Pupils are equal, round, and reactive to light.  Cardiovascular:     Rate and Rhythm: Normal rate and regular rhythm.  Pulmonary:     Effort: Pulmonary effort is normal.     Breath sounds: Normal breath sounds.  Abdominal:     General: Bowel sounds are normal.  Skin:    General: Skin is warm and dry.     Findings: Erythema present.       Neurological:     Mental Status: He is alert and oriented to person, place, and time.  Psychiatric:        Mood and Affect: Mood normal.        Behavior: Behavior normal.  BP 124/84 (BP Location: Left Arm, Patient Position: Sitting, Cuff Size: Large)   Pulse 67   Temp (!) 97 F (36.1 C) (Temporal)   Ht 6\' 3"  (1.905 m)   Wt 234 lb (106.1 kg)   SpO2 94%   BMI 29.25 kg/m  Wt Readings from Last 3 Encounters:  09/01/20 234 lb (106.1 kg)  04/11/20 235 lb (106.6 kg)  01/16/20 (!) 235 lb (106.6 kg)   Subjective:    Samuel [name] is a 70 y.o.  male whom I am asked to see for evaluation of diminished hearing in the left ear for the past 2 months. There is a prior history of cerumen impaction. The patient has not been using ear drops to loosen wax immediately prior to this visit. The patient denies ear pain.  The patient's history has been marked as reviewed and updated as appropriate. allergies, current medications, past family history, past medical history, past social history, past surgical history and problem list  Review of Systems Pertinent items are noted in HPI.    Objective:    Auditory canal(s) of the left ear are completely obstructed with cerumen.   Cerumen was removed using gentle irrigation. Tympanic membranes are intact following the procedure.  Auditory canals are normal.    Assessment:    Cerumen Impaction without otitis externa.    Plan:    1. Care instructions given. 2. Home treatment: ceruminex ear drops. 3. Follow-up as needed.   Health Maintenance Due  Topic Date Due  . Hepatitis C Screening  Never done  . FOOT EXAM  Never done  . PNA vac Low Risk Adult (1 of 2 - PCV13) Never done  . COVID-19 Vaccine (3 - Booster for Moderna series) 03/24/2020    There are no preventive care reminders to display for this patient.  Lab Results  Component Value Date   TSH 0.561 06/09/2019   Lab Results  Component Value Date   WBC 5.4 04/11/2020   HGB 13.5 04/11/2020   HCT 39.5 04/11/2020   MCV 89.2 04/11/2020   PLT 211.0 04/11/2020   Lab Results  Component Value Date   NA 134 (L) 04/11/2020   K 3.9 04/11/2020   CO2 27 04/11/2020   GLUCOSE 216 (H) 04/11/2020   BUN 24 (H) 04/11/2020   CREATININE 0.92 04/11/2020   BILITOT 0.3 04/11/2020   ALKPHOS 79 04/11/2020   AST 15 04/11/2020   ALT 15 04/11/2020   PROT 6.4 04/11/2020   ALBUMIN 3.5 04/11/2020   CALCIUM 8.7 04/11/2020   ANIONGAP 15 01/16/2020   GFR 86.32 04/11/2020   Lab Results  Component Value Date   CHOL 156 04/11/2020   Lab Results   Component Value Date   HDL 31.20 (L) 04/11/2020   Lab Results  Component Value Date   LDLCALC 94 08/17/2018   Lab Results  Component Value Date   TRIG 225.0 (H) 04/11/2020   Lab Results  Component Value Date   CHOLHDL 5 04/11/2020   Lab Results  Component Value Date   HGBA1C 12.0 (A) 06/22/2020      Assessment & Plan:   Problem List Items Addressed This Visit      Cardiovascular and Mediastinum   Pulmonary embolism (HCC)   Relevant Orders   Ambulatory referral to Pulmonology   NSTEMI (non-ST elevated myocardial infarction) (HCC) - Primary   Relevant Orders   CBC   Comprehensive metabolic panel   Lipid panel   Ambulatory referral to Cardiology  Respiratory   COPD (chronic obstructive pulmonary disease) (HCC)   Relevant Orders   Ambulatory referral to Pulmonology     Digestive   Intractable nausea and vomiting     Nervous and Auditory   Excessive cerumen in left ear canal     Other   S/P BKA (below knee amputation) (HCC)   Relevant Orders   Ambulatory referral to Physical Therapy   Cellulitis of right lower extremity   Relevant Medications   cephALEXin (KEFLEX) 500 MG capsule      Meds ordered this encounter  Medications  . cephALEXin (KEFLEX) 500 MG capsule    Sig: Take 1 capsule (500 mg total) by mouth 3 (three) times daily for 14 days.    Dispense:  42 capsule    Refill:  0    Follow-up: Return in about 4 months (around 01/01/2021).  Patient will follow-up in a few weeks if the rash on his right leg does not clear with 14 days of Keflex.  Advised him to use Cerumenex in both of his ears on a regular basis.  Could not find injectable promethazine.  He will check with his pharmacist and they will send me a request for them and I will approve it.  He will also schedule his own appointment with Dr. Matthias Hughs to follow-up with this matter as well.  Mliss Sax, MD

## 2020-09-08 NOTE — Telephone Encounter (Signed)
Patient is calling back to check the status of refill request. He also has questions about his PT referral. Please call him back at [phone] (okay to leave a message).

## 2020-09-10 DIAGNOSIS — Z89512 Acquired absence of left leg below knee: Secondary | ICD-10-CM | POA: Diagnosis not present

## 2020-09-10 NOTE — Addendum Note (Signed)
Addended by: Andrez Grime on: 09/10/2020 10:01 AM   Modules accepted: Orders

## 2020-09-16 ENCOUNTER — Telehealth: Payer: Self-pay

## 2020-09-16 NOTE — Telephone Encounter (Signed)
Haven't seen an email. Perhaps it would be best for Dr. Matthias Hughs to fill it.

## 2020-09-21 ENCOUNTER — Ambulatory Visit: Payer: HMO | Admitting: Internal Medicine

## 2020-09-21 NOTE — Progress Notes (Deleted)
Name: Samuel [name]  Age/ Sex: 70 y.o., male   MRN/ DOB: [id number], 1950/07/11     PCP: Mliss Sax, MD   Reason for Endocrinology Evaluation: Type 2 Diabetes Mellitus  Initial Endocrine Consultative Visit: 06/22/2020    PATIENT IDENTIFIER: [name] is a 70 y.o. male with a past medical history ofDM, CAD , HTN and dyslipidemia . The patient has followed with Endocrinology clinic since 06/22/2020 for consultative assistance with management of his diabetes.  DIABETIC HISTORY:  [name] was diagnosed with DM at age 37.  Was started on insulin shortly after his diagnosis His hemoglobin A1c has ranged from 6.4% in 2011, peaking at 13.1% in 2021.    On his initial visit to our clinic he had an A1c of 12.0%, he was using Basaglar sparingly, he was not using prandial insulin at all, he was on metformin and acarbose.  We stopped his Metformin due to chronic nausea.   Of note the patient with history of acidosis with beta hydroxybutyrate as high as 7.74 in 2021. Patient is not a candidate for SGLT2 inhibitors due to acidosis   Eyes- Dr. Beulah Gandy. Ashley Royalty Cardio- Dr.  Nanetta Batty  Pulm. Dr. Levy Pupa  SUBJECTIVE:   During the last visit (06/22/2020): A1c 12% we stopped Metformin, we attempted to prescribe [name] and a standing dose of prandial insulin, we continued Acarbose  Today (09/21/2020): [name] is here for follow-up on his diabetes. He checks his blood sugars *** times daily, preprandial to breakfast and ***. The patient has *** had hypoglycemic episodes since the last clinic visit, which typically occur *** x / - most often occuring ***. The patient is *** symptomatic with these episodes, with symptoms of {symptoms; hypoglycemia:9084048}.    HOME DIABETES REGIMEN:  Humalog/Novolog 20 units with each meal  Tresiba 100 units ONCE DAILY   Acarbose 50 mg, 1 tablet three times a day   Statin: Yes ACE-I/ARB: No Prior Diabetic Education: Yes   METER DOWNLOAD  SUMMARY: Date range evaluated: *** Fingerstick Blood Glucose Tests = *** Average Number Tests/Day = *** Overall Mean FS Glucose = *** Standard Deviation = ***  BG Ranges: Low = *** High = ***   Hypoglycemic Events/30 Days: BG < 50 = *** Episodes of symptomatic severe hypoglycemia = ***    DIABETIC COMPLICATIONS: Microvascular complications:   Left BKA, neuropathy   Denies: CKD, retinopathy   Last eye exam: Completed over a year ago   Macrovascular complications:   CAD ( S/P stent placement) /PVD  Denies: CVA   HISTORY:  Past Medical History:  Past Medical History:  Diagnosis Date  . Anxiety   . Arthritis    "all over"   . CAD (coronary artery disease)   . Charcot's joint    "left foot"  . 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 Depression   . Gastroparesis   . GERD (gastroesophageal reflux disease)   . H/O hiatal hernia   . Hyperlipidemia   . Hypertension   . Myocardial infarction (HCC) 2017/03/27   around this date  . OSA (obstructive sleep apnea)    "not bad enough for a mask"  . Peripheral neuropathy   . Peripheral vascular disease (HCC)   . PONV (postoperative nausea and vomiting)   . Pulmonary embolism (HCC)    hx. of 2012  . Shortness of breath    exertion  . Type II diabetes mellitus (HCC)  Past Surgical History:  Past Surgical History:  Procedure Laterality Date  . AMPUTATION Left 03/08/2015   Procedure:  LEFT AMPUTATION BELOW KNEE;  Surgeon: Toni Arthurs, MD;  Location: [facility];  Service: Orthopedics;  Laterality: Left;  . APPLICATION OF WOUND VAC Left 01/07/2014  . CHOLECYSTECTOMY  06/2010  . CORONARY ANGIOPLASTY WITH STENT PLACEMENT  05/2009   "1"  . FOOT SURGERY Left 2010   "for Charcot's joint"  . HARDWARE REMOVAL Left 01/07/2014   Procedure: LEFT LEG REMOVAL OF DEEP IMPLANT AND SEQUESTRECTOMY; APPLICATION OF WOUND VAC ;  Surgeon: Toni Arthurs, MD;  Location: [facility] OR;  Service: Orthopedics;  Laterality: Left;  .  I & D EXTREMITY Left "multiple"   leg  . 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 IM NAILING TIBIA Left ~ 2012  . LEFT HEART CATH AND CORONARY ANGIOGRAPHY N/A 04/08/2017   Procedure: LEFT HEART CATH AND CORONARY ANGIOGRAPHY;  Surgeon: Runell Gess, MD;  Location: [facility];  Service: Cardiovascular;  Laterality: N/A;  . SHOULDER ARTHROSCOPY W/ ROTATOR CUFF REPAIR Left    "and bone spurs"  . TIBIAL IM ROD REMOVAL Left 01/07/2014  . TOE AMPUTATION Right ~ 2011   "great toe"  . VENA CAVA FILTER PLACEMENT  2012  . VENA CAVA FILTER PLACEMENT N/A 11/20/2016   Procedure: INSERTION VENA-CAVA FILTER;  Surgeon: Sherren Kerns, MD;  Location: [facility] OR;  Service: Vascular;  Laterality: N/A;  . WOUND DEBRIDEMENT Left 01/07/2014   "tibia"     Social History:  reports that he has never smoked. He has never used smokeless tobacco. He reports that he does not drink alcohol and does not use drugs. Family History:  Family History  Problem Relation Age of Onset  . COPD Mother   . Heart disease Mother   . Lung cancer Mother   . Retinal detachment Father   . Alcoholism Brother   . Heart disease Brother   . COPD Brother   . Diabetes Brother   . Hypertension Brother   . Stroke Brother       HOME MEDICATIONS: Allergies as of 09/21/2020      Reactions   Gabapentin Hives, Itching, Nausea And Vomiting, Rash   REACTION: rash/hives (per patient, was a reaction to an inactive ingredient in another mgf brand). The patient stated that he does take this medication now and it doesn't cause a rash any more. LOW DOSE   Relafen [nabumetone] Nausea And Vomiting, Rash   Codeine Nausea And Vomiting, Rash      Medication List       Accurate as of September 21, 2020 12:59 PM. If you have any questions, ask your nurse or doctor.        acarbose 50 MG  tablet Commonly known as: PRECOSE Take 50 mg by mouth 3 (three) times daily with meals.   alum & mag hydroxide-simeth 200-200-20 MG/5ML suspension Commonly known as: MAALOX/MYLANTA Take 15 mLs by mouth every 6 (six) hours as needed for indigestion or heartburn.   amLODipine 10 MG tablet Commonly known as: NORVASC Take 1 tablet (10 mg total) by mouth daily.   apixaban 5 MG Tabs tablet Commonly known as: ELIQUIS Take 5 mg by mouth 2 (two) times daily.   aspirin 81 MG EC tablet Take 1 tablet (81 mg total) by mouth daily.  atorvastatin 80 MG tablet Commonly known as: LIPITOR Take 1 tablet (80 mg total) by mouth daily at 6 PM. What changed: when to take this   clopidogrel 75 MG tablet Commonly known as: PLAVIX Take 75 mg by mouth daily.   docusate sodium 100 MG capsule Commonly known as: COLACE Take 100 mg by mouth daily.   furosemide 20 MG tablet Commonly known as: LASIX Take 1 tablet (20 mg total) by mouth daily.   gabapentin 300 MG capsule Commonly known as: NEURONTIN Take 600 mg by mouth 2 (two) times daily.   metoprolol tartrate 25 MG tablet Commonly known as: LOPRESSOR Take 1 tablet (25 mg total) by mouth 2 (two) times daily.   nitroGLYCERIN 0.4 MG SL tablet Commonly known as: NITROSTAT Place 1 tablet (0.4 mg total) under the tongue every 5 (five) minutes as needed for chest pain.   NovoLOG FlexPen 100 UNIT/ML FlexPen Generic drug: insulin aspart Inject 20 Units into the skin 3 (three) times daily with meals.   OneTouch Verio test strip Generic drug: glucose blood Use to test blood sugar twice daily   pantoprazole 40 MG tablet Commonly known as: PROTONIX Take 1 tablet (40 mg total) by mouth 2 (two) times daily before a meal.   promethazine 25 MG tablet Commonly known as: PHENERGAN Take 25-50 mg by mouth every 6 (six) hours as needed for nausea or vomiting.   promethazine 25 MG/ML injection Commonly known as: PHENERGAN Inject 0.5 mLs (12.5 mg total)  into the muscle every 6 (six) hours as needed for nausea or vomiting.   ranolazine 500 MG 12 hr tablet Commonly known as: RANEXA Take 1 tablet (500 mg total) by mouth 2 (two) times daily.   traMADol 50 MG tablet Commonly known as: ULTRAM Take 100 mg by mouth every 6 (six) hours as needed for moderate pain.   [name] FlexTouch 200 UNIT/ML FlexTouch Pen Generic drug: insulin degludec Inject 100 Units into the skin daily.        OBJECTIVE:   Vital Signs: There were no vitals taken for this visit.  Wt Readings from Last 3 Encounters:  09/01/20 234 lb (106.1 kg)  04/11/20 235 lb (106.6 kg)  01/16/20 (!) 235 lb (106.6 kg)     Exam: General: Pt appears well and is in NAD  Hydration: Well-hydrated with moist mucous membranes and good skin turgor  HEENT: Head: Unremarkable with good dentition. Oropharynx clear without exudate.  Eyes: External eye exam normal without stare, lid lag or exophthalmos.  EOM intact.  PERRL.  Neck: General: Supple without adenopathy. Thyroid: Thyroid size normal.  No goiter or nodules appreciated. No thyroid bruit.  Lungs: Clear with good BS bilat with no rales, rhonchi, or wheezes  Heart: RRR with normal S1 and S2 and no gallops; no murmurs; no rub  Abdomen: Normoactive bowel sounds, soft, nontender, without masses or organomegaly palpable  Extremities: No pretibial edema. No tremor. Normal strength and motion throughout. See detailed diabetic foot exam below.  Skin: Normal texture and temperature to palpation. No rash noted. No Acanthosis nigricans/skin tags. No lipohypertrophy.  Neuro: MS is good with appropriate affect, pt is alert and Ox3    DM foot exam: Please see diabetic assessment flow-sheet detailed below:           DATA REVIEWED:  Lab Results  Component Value Date   HGBA1C 12.0 (A) 06/22/2020   HGBA1C 13.1 (H) 04/11/2020   HGBA1C 8.5 (H) 01/16/2020   Lab Results  Component Value Date  MICROALBUR 36.0 (H) 04/11/2020   LDLCALC  101 (H) 09/01/2020   CREATININE 0.85 09/01/2020   Lab Results  Component Value Date   MICRALBCREAT 34.9 (H) 04/11/2020     Lab Results  Component Value Date   CHOL 167 09/01/2020   HDL 38.60 (L) 09/01/2020   LDLCALC 101 (H) 09/01/2020   LDLDIRECT 101.0 04/11/2020   TRIG 136.0 09/01/2020   CHOLHDL 4 09/01/2020         ASSESSMENT / PLAN / RECOMMENDATIONS:   1) Type 2 Diabetes Mellitus, poorly controlled, With gastroparesis, neuropathic and macrovascular complications - Most recent A1c of *** %. Goal A1c <7.0%.    Plan: MEDICATIONS:  ***  EDUCATION / INSTRUCTIONS:  BG monitoring instructions: Patient is instructed to check his blood sugars *** times a day, ***.  Call [facility] if: BG persistently < 70  . I reviewed the Rule of 15 for the treatment of hypoglycemia in detail with the patient. Literature supplied.   2) Diabetic complications:   Eye: Does *** have known diabetic retinopathy.   Neuro/ Feet: Does *** have known diabetic peripheral neuropathy .   Renal: Patient does *** have known baseline CKD. He   is *** on an ACEI/ARB at present. Check urine albumin/creatinine ratio yearly starting at time of diagnosis. If albuminuria is positive, treatment is geared toward better glucose, blood pressure control and use of ACE inhibitors or ARBs. Monitor electrolytes and creatinine once to twice yearly.   3) Lipids: Patient is *** on a statin.  4) Hypertension: *** at goal of < 140/90 mmHg.    F/U in ***    Signed electronically by: Lyndle HerrlichAbby Jaralla Mouhamed Glassco, MD  [facility]  [facility] [address] [place], [place] [address] Phone: [phone] FAX: [phone]   CC: Mliss SaxKremer, William Alfred, MD [address] [place] [address] Phone: [phone]  Fax: [phone]  Return to Endocrinology clinic as below: Future Appointments  Date Time Provider Department Center  09/21/2020  2:40 PM  Ximena Todaro, Konrad DoloresIbtehal Jaralla, MD [facility] None  01/02/2021  4:00 PM Mliss SaxKremer, William Alfred, MD [facility]

## 2020-09-23 ENCOUNTER — Other Ambulatory Visit: Payer: Self-pay | Admitting: Family Medicine

## 2020-11-08 ENCOUNTER — Telehealth: Payer: Self-pay | Admitting: Family Medicine

## 2020-11-08 NOTE — Telephone Encounter (Signed)
Patient is calling to get a refill on his Tramadol. If approved, please send to [facility] and call patient at [phone] to let him know that it has been sent in.

## 2020-11-09 ENCOUNTER — Telehealth: Payer: Self-pay | Admitting: Family Medicine

## 2020-11-09 NOTE — Telephone Encounter (Signed)
Returned patient call to see if he was completely out of requested rx due to Dr. Doreene Burke not being the Provider who originally prescribed this Rx. No answer LMTCB

## 2020-11-10 NOTE — Telephone Encounter (Signed)
Called patient regarding request for medication, no answer LMTCB

## 2020-11-10 NOTE — Telephone Encounter (Signed)
Patient is calling back to check the status of his Tramadol refill. Please advise.

## 2020-11-15 ENCOUNTER — Ambulatory Visit: Payer: HMO

## 2020-11-15 ENCOUNTER — Telehealth: Payer: Self-pay

## 2020-11-15 NOTE — Telephone Encounter (Signed)
Called left several messages on VM for patient to call back regarding refill request. And to inform of message below will try to reach again later.

## 2020-11-15 NOTE — Telephone Encounter (Signed)
Attemtped x 3 to reach patient for his 3:00 phone visit to complete his Medicare Wellness exam-Left message for pt to call back.

## 2020-11-15 NOTE — Progress Notes (Unsigned)
Subjective:   Samuel [name] is a 70 y.o. male who presents for Medicare Annual/Subsequent preventive examination.  I connected with *** today by telephone and verified that I am speaking with the correct person using two identifiers. Location patient: home Location provider: work Persons participating in the virtual visit: patient, Engineer, civil (consulting).    I discussed the limitations, risks, security and privacy concerns of performing an evaluation and management service by telephone and the availability of in person appointments. I also discussed with the patient that there may be a patient responsible charge related to this service. The patient expressed understanding and verbally consented to this telephonic visit.    Interactive audio and video telecommunications were attempted between this provider and patient, however failed, due to patient having technical difficulties OR patient did not have access to video capability.  We continued and completed visit with audio only.  Some vital signs may be absent or patient reported.   Time Spent with patient on telephone encounter: *** minutes   Review of Systems    ***       Objective:    There were no vitals filed for this visit. There is no height or weight on file to calculate BMI.  Advanced Directives 01/16/2020 01/16/2020 10/16/2019 09/23/2019 08/09/2019 08/09/2019 06/22/2019  Does Patient Have a Medical Advance Directive? No Unable to assess, patient is non-responsive or altered mental status No No No No No  Type of Advance Directive - - - - - - -  Does patient want to make changes to medical advance directive? - - - - - - -  Copy of Healthcare Power of Attorney in Chart? - - - - - - -  Would patient like information on creating a medical advance directive? No - Guardian declined - No - Patient declined No - Patient declined No - Patient declined No - Patient declined -  Pre-existing out of facility DNR order (yellow form or pink MOST form) - - - - -  - -    Current Medications (verified) Outpatient Encounter Medications as of 11/15/2020  Medication Sig  . acarbose (PRECOSE) 50 MG tablet Take 50 mg by mouth 3 (three) times daily with meals.  [name] alum & mag hydroxide-simeth (MAALOX/MYLANTA) 200-200-20 MG/5ML suspension Take 15 mLs by mouth every 6 (six) hours as needed for indigestion or heartburn.  [name] amLODipine (NORVASC) 10 MG tablet Take 1 tablet (10 mg total) by mouth daily.  [name] apixaban (ELIQUIS) 5 MG TABS tablet Take 5 mg by mouth 2 (two) times daily.  [name] aspirin 81 MG EC tablet Take 1 tablet (81 mg total) by mouth daily.  [name] atorvastatin (LIPITOR) 80 MG tablet Take 1 tablet (80 mg total) by mouth daily at 6 PM. (Patient taking differently: Take 80 mg by mouth daily.)  . clopidogrel (PLAVIX) 75 MG tablet Take 75 mg by mouth daily.  [name] docusate sodium (COLACE) 100 MG capsule Take 100 mg by mouth daily.  . furosemide (LASIX) 20 MG tablet Take 1 tablet (20 mg total) by mouth daily.  [name] gabapentin (NEURONTIN) 300 MG capsule TAKE 1 CAPSULE BY MOUTH 3 TIMES A DAY  . insulin aspart (NOVOLOG FLEXPEN) 100 UNIT/ML FlexPen Inject 20 Units into the skin 3 (three) times daily with meals.  . insulin degludec (TRESIBA FLEXTOUCH) 200 UNIT/ML FlexTouch Pen Inject 100 Units into the skin daily.  . metoprolol tartrate (LOPRESSOR) 25 MG tablet Take 1 tablet (25 mg total) by mouth 2 (two) times daily.  . nitroGLYCERIN (  NITROSTAT) 0.4 MG SL tablet Place 1 tablet (0.4 mg total) under the tongue every 5 (five) minutes as needed for chest pain.  Letta Pate VERIO test strip Use to test blood sugar twice daily  . pantoprazole (PROTONIX) 40 MG tablet Take 1 tablet (40 mg total) by mouth 2 (two) times daily before a meal.  . promethazine (PHENERGAN) 25 MG tablet TAKE 1 TO 2 TABLETS BY MOUTH EVERY 4 TO 6 HOURS AS NEEDED FOR NAUSEA  . promethazine (PHENERGAN) 25 MG/ML injection Inject 0.5 mLs (12.5 mg total) into the muscle every 6 (six) hours as needed for nausea or  vomiting.  . ranolazine (RANEXA) 500 MG 12 hr tablet Take 1 tablet (500 mg total) by mouth 2 (two) times daily. (Patient not taking: Reported on 09/01/2020)  . traMADol (ULTRAM) 50 MG tablet Take 100 mg by mouth every 6 (six) hours as needed for moderate pain.    No facility-administered encounter medications on file as of 11/15/2020.    Allergies (verified) Gabapentin, Relafen [nabumetone], and Codeine   History: Past Medical History:  Diagnosis Date  . Anxiety   . Arthritis    "all over"   . CAD (coronary artery disease)   . Charcot's joint    "left foot"  . 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 Depression   . Gastroparesis   . GERD (gastroesophageal reflux disease)   . H/O hiatal hernia   . Hyperlipidemia   . Hypertension   . Myocardial infarction (HCC) 2017/03/27   around this date  . OSA (obstructive sleep apnea)    "not bad enough for a mask"  . Peripheral neuropathy   . Peripheral vascular disease (HCC)   . PONV (postoperative nausea and vomiting)   . Pulmonary embolism (HCC)    hx. of 2012  . Shortness of breath    exertion  . Type II diabetes mellitus (HCC)    Past Surgical History:  Procedure Laterality Date  . AMPUTATION Left 03/08/2015   Procedure:  LEFT AMPUTATION BELOW KNEE;  Surgeon: Toni Arthurs, MD;  Location: [facility];  Service: Orthopedics;  Laterality: Left;  . APPLICATION OF WOUND VAC Left 01/07/2014  . CHOLECYSTECTOMY  06/2010  . CORONARY ANGIOPLASTY WITH STENT PLACEMENT  05/2009   "1"  . FOOT SURGERY Left 2010   "for Charcot's joint"  . HARDWARE REMOVAL Left 01/07/2014   Procedure: LEFT LEG REMOVAL OF DEEP IMPLANT AND SEQUESTRECTOMY; APPLICATION OF WOUND VAC ;  Surgeon: Toni Arthurs, MD;  Location: [facility] OR;  Service: Orthopedics;  Laterality: Left;  . I & D EXTREMITY Left "multiple"   leg  . 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 IM NAILING TIBIA Left ~ 2012  . LEFT HEART CATH AND CORONARY ANGIOGRAPHY N/A 04/08/2017   Procedure: LEFT HEART CATH AND CORONARY ANGIOGRAPHY;  Surgeon: Runell Gess, MD;  Location: [facility];  Service: Cardiovascular;  Laterality: N/A;  . SHOULDER ARTHROSCOPY W/ ROTATOR CUFF REPAIR Left    "and bone spurs"  . TIBIAL IM ROD REMOVAL Left 01/07/2014  . TOE AMPUTATION Right ~ 2011   "great toe"  . VENA CAVA FILTER PLACEMENT  2012  . VENA CAVA FILTER PLACEMENT N/A 11/20/2016   Procedure: INSERTION VENA-CAVA FILTER;  Surgeon: Sherren Kerns, MD;  Location: [facility] OR;  Service: Vascular;  Laterality: N/A;  . WOUND DEBRIDEMENT Left 01/07/2014   "tibia"   Family History  Problem Relation Age of Onset  . COPD Mother   . Heart disease Mother   . Lung cancer Mother   . Retinal detachment Father   . Alcoholism Brother   . Heart disease Brother   . COPD Brother   . Diabetes Brother   . Hypertension Brother   . Stroke Brother    Social History   Socioeconomic History  . Marital status: Divorced    Spouse name: Not on file  . Number of children: Not on file  . Years of education: 3612  . Highest education level: Not on file  Occupational History  . Occupation: DISABLITY    Employer: UNEMPLOYED  Tobacco Use  . Smoking status: Never Smoker  . Smokeless tobacco: Never Used  Vaping Use  . Vaping Use: Never used  Substance and Sexual Activity  . Alcohol use: No    Comment: 01/07/2014 'might have a wine cooler a couple times/yr"  . Drug use: No  . Sexual activity: Never  Other Topics Concern  . Not on file  Social History Narrative  . Not on file   Social Determinants of Health   Financial Resource Strain: Not on file  Food Insecurity: Not on file  Transportation Needs: Not on file  Physical Activity: Not on file  Stress: Not on file  Social Connections: Not on file    Tobacco Counseling Counseling given: Not  Answered   Clinical Intake:                 Diabetes:  Is the patient diabetic?  Yes  If diabetic, was a CBG obtained today?  No  Did the patient bring in their glucometer from home?  No phone visit How often do you monitor your CBG's? ***.   Financial Strains and Diabetes Management:  Are you having any financial strains with the device, your supplies or your medication? {YES/NO:21197}.  Does the patient want to be seen by Chronic Care Management for management of their diabetes?  {YES/NO:21197} Would the patient like to be referred to a Nutritionist or for Diabetic Management?  {YES/NO:21197}  Diabetic Exams:  Diabetic Eye Exam: Completed ***. Overdue for diabetic eye exam. Pt has been advised about the importance in completing this exam. A referral has been placed today. Message sent to referral coordinator for scheduling purposes. Advised pt to expect a call from our office re: appt.  Diabetic Foot Exam: Pt has been advised about the importance in completing this exam. To be completed by PCP.            Activities of Daily Living No flowsheet data found.  Patient Care Team: Mliss SaxKremer, William Alfred, MD as PCP - General (Family Medicine) Cliffton Astersampbell, Allen, MD as Consulting Physician (Infectious Diseases) Mercy RidingBray, Bryan, [facility] as Referring Physician (Pharmacist) Toni ArthursHewitt, Jarl, MD as Consulting Physician (Orthopedic Surgery)  Indicate any recent Medical Services you may have received from other than [facility] providers in the past year (date may be approximate).     Assessment:   This is a routine wellness examination for Samuel [name].  Hearing/Vision screen No exam data present  Dietary issues and exercise activities discussed:    Goals Addressed   None    Depression Screen PHQ 2/9 Scores 04/11/2020 04/11/2020 07/28/2018 06/03/2018 09/25/2017 06/14/2017 04/19/2017  PHQ - 2 Score 0 0 1 1 0 0 0  PHQ- 9 Score 9 - - - - - -  Fall Risk Fall Risk  04/11/2020 07/28/2018  09/25/2017 08/27/2017 08/08/2017  Falls in the past year? 0 0 No (No Data) (No Data)  Comment - - - Patient denies new/ recent falls Patient denies new/ recent falls  Number falls in past yr: - - - - -  Injury with Fall? - - - - -  Risk Factor Category  - - - - -  Risk for fall due to : - - - - -  Follow up - - - - -    FALL RISK PREVENTION PERTAINING TO THE HOME:  Any stairs in or around the home? {YES/NO:21197} If so, are there any without handrails? {YES/NO:21197} Home free of loose throw rugs in walkways, pet beds, electrical cords, etc? {YES/NO:21197} Adequate lighting in your home to reduce risk of falls? {YES/NO:21197}  ASSISTIVE DEVICES UTILIZED TO PREVENT FALLS:  Life alert? {YES/NO:21197} Use of a cane, walker or w/c? {YES/NO:21197} Grab bars in the bathroom? {YES/NO:21197} Shower chair or bench in shower? {YES/NO:21197} Elevated toilet seat or a handicapped toilet? {YES/NO:21197}  TIMED UP AND GO:  Was the test performed? {YES/NO:21197}.  Length of time to ambulate 10 feet: *** sec.   {Appearance of ZOXW:9604540}  Cognitive Function:        Immunizations Immunization History  Administered Date(s) Administered  . Fluad Quad(high Dose 65+) 04/11/2020  . Influenza,inj,Quad PF,6+ Mos 03/02/2014  . Moderna Sars-Covid-2 Vaccination 08/26/2019, 09/23/2019  . PPD Test 03/15/2015  . Tdap 02/08/2012    TDAP status: Up to date  Flu Vaccine status: Up to date  Pneumococcal vaccine status: Due, Education has been provided regarding the importance of this vaccine. Advised may receive this vaccine at local pharmacy or Health Dept. Aware to provide a copy of the vaccination record if obtained from local pharmacy or Health Dept. Verbalized acceptance and understanding.  {Covid-19 vaccine status:2101808}  Qualifies for Shingles Vaccine? Yes   Zostavax completed No   Shingrix Completed?: No.    Education has been provided regarding the importance of this vaccine. Patient  has been advised to call insurance company to determine out of pocket expense if they have not yet received this vaccine. Advised may also receive vaccine at local pharmacy or Health Dept. Verbalized acceptance and understanding.  Screening Tests Health Maintenance  Topic Date Due  . FOOT EXAM  Never done  . Hepatitis C Screening  Never done  . Zoster Vaccines- Shingrix (1 of 2) Never done  . PNA vac Low Risk Adult (1 of 2 - PCV13) Never done  . COVID-19 Vaccine (3 - Booster for Moderna series) 02/23/2020  . OPHTHALMOLOGY EXAM  10/20/2020  . HEMOGLOBIN A1C  12/20/2020  . INFLUENZA VACCINE  01/16/2021  . URINE MICROALBUMIN  04/11/2021  . TETANUS/TDAP  02/07/2022  . COLONOSCOPY (Pts 45-52yrs Insurance coverage will need to be confirmed)  11/01/2022  . HPV VACCINES  Aged Out    Health Maintenance  Health Maintenance Due  Topic Date Due  . FOOT EXAM  Never done  . Hepatitis C Screening  Never done  . Zoster Vaccines- Shingrix (1 of 2) Never done  . PNA vac Low Risk Adult (1 of 2 - PCV13) Never done  . COVID-19 Vaccine (3 - Booster for Moderna series) 02/23/2020  . OPHTHALMOLOGY EXAM  10/20/2020    Colorectal cancer screening: Type of screening: Colonoscopy. Completed 10/31/2012. Repeat every 10 years  Lung Cancer Screening: (Low Dose CT Chest recommended if Age 69-80 years, 30 pack-year currently smoking OR have quit  w/in 15years.) does not qualify.     Additional Screening:  Hepatitis C Screening: does qualify; Completed ***  Vision Screening: Recommended annual ophthalmology exams for early detection of glaucoma and other disorders of the eye. Is the patient up to date with their annual eye exam?  {YES/NO:21197} Who is the provider or what is the name of the office in which the patient attends annual eye exams? *** If pt is not established with a provider, would they like to be referred to a provider to establish care? {YES/NO:21197}.   Dental Screening: Recommended annual  dental exams for proper oral hygiene  Community Resource Referral / Chronic Care Management: CRR required this visit?  {YES/NO:21197}  CCM required this visit?  {YES/NO:21197}     Plan:     I have personally reviewed and noted the following in the patient's chart:   . Medical and social history . Use of alcohol, tobacco or illicit drugs  . Current medications and supplements including opioid prescriptions. {Opioid Prescriptions:[phone]} . Functional ability and status . Nutritional status . Physical activity . Advanced directives . List of other physicians . Hospitalizations, surgeries, and ER visits in previous 12 months . Vitals . Screenings to include cognitive, depression, and falls . Referrals and appointments  In addition, I have reviewed and discussed with patient certain preventive protocols, quality metrics, and best practice recommendations. A written personalized care plan for preventive services as well as general preventive health recommendations were provided to patient.   Due to this being a telephonic visit, the after visit summary with patients personalized plan was offered to patient via mail or my-chart. ***Patient declined at this time./ Patient would like to access on my-chart/ per request, patient was mailed a copy of AVS./ Patient preferred to pick up at office at next visit.   Roanna Raider, LPN   [id number]  Nurse Health Advisor  Nurse Notes: ***

## 2020-11-17 ENCOUNTER — Telehealth: Payer: Self-pay | Admitting: Family Medicine

## 2020-11-17 NOTE — Telephone Encounter (Signed)
Error

## 2020-11-21 ENCOUNTER — Encounter: Payer: Self-pay | Admitting: Family Medicine

## 2020-11-21 ENCOUNTER — Other Ambulatory Visit: Payer: Self-pay

## 2020-11-21 ENCOUNTER — Ambulatory Visit (INDEPENDENT_AMBULATORY_CARE_PROVIDER_SITE_OTHER): Payer: HMO | Admitting: Family Medicine

## 2020-11-21 VITALS — BP 124/97 | HR 79 | Temp 97.8°F | Ht 75.0 in | Wt 235.0 lb

## 2020-11-21 DIAGNOSIS — R112 Nausea with vomiting, unspecified: Secondary | ICD-10-CM

## 2020-11-21 DIAGNOSIS — M19041 Primary osteoarthritis, right hand: Secondary | ICD-10-CM

## 2020-11-21 DIAGNOSIS — M19042 Primary osteoarthritis, left hand: Secondary | ICD-10-CM | POA: Diagnosis not present

## 2020-11-21 MED ORDER — PROMETHAZINE HCL 25 MG PO TABS: ORAL_TABLET | ORAL | 1 refills | Status: AC

## 2020-11-21 MED ORDER — TRAMADOL HCL 50 MG PO TABS: 100.0000 mg | ORAL_TABLET | Freq: Four times a day (QID) | ORAL | 1 refills | Status: AC | PRN

## 2020-11-21 MED ORDER — DICLOFENAC SODIUM 1 % EX GEL: CUTANEOUS | 1 refills | Status: AC

## 2020-11-21 NOTE — Progress Notes (Signed)
Established Patient Office Visit  Subjective:  Patient ID: Samuel [name], male    DOB: 1950-07-13  Age: 70 y.o. MRN: [id number]  CC:  Chief Complaint  Patient presents with  . Follow-up    F/u meds.  Tramadol. no concerns.     HPI Samuel [name] presents for follow-up of his chronic nausea with vomiting.  The nausea is a daily occurrence for him.  It is mostly controlled with his the Phenergan pills.  He only uses the injectable Phenergan when he feels as though he will be able to hold the pills down.  He does not mix them together.  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  He has been given a small amount of tramadol in the past to take the pain is really bad.  Last refill was a few years ago.  He says that he rarely takes it.  Past Medical History:  Diagnosis Date  . Anxiety   . Arthritis    "all over"   . CAD (coronary artery disease)   . Charcot's joint    "left foot"  . 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 Depression   . Gastroparesis   . GERD (gastroesophageal reflux disease)   . H/O hiatal hernia   . Hyperlipidemia   . Hypertension   . Myocardial infarction (HCC) 2017/03/27   around this date  . OSA (obstructive sleep apnea)    "not bad enough for a mask"  . Peripheral neuropathy   . Peripheral vascular disease (HCC)   . PONV (postoperative nausea and vomiting)   . Pulmonary embolism (HCC)    hx. of 2012  . Shortness of breath    exertion  . Type II diabetes mellitus (HCC)     Past Surgical History:  Procedure Laterality Date  . AMPUTATION Left 03/08/2015   Procedure:  LEFT AMPUTATION BELOW KNEE;  Surgeon: Toni Arthurs, MD;  Location: [facility];  Service: Orthopedics;  Laterality: Left;  . APPLICATION OF WOUND VAC Left 01/07/2014  . CHOLECYSTECTOMY  06/2010  . CORONARY ANGIOPLASTY WITH STENT PLACEMENT  05/2009   "1"  . FOOT SURGERY Left 2010   "for Charcot's joint"  . HARDWARE REMOVAL Left 01/07/2014   Procedure: LEFT  LEG REMOVAL OF DEEP IMPLANT AND SEQUESTRECTOMY; APPLICATION OF WOUND VAC ;  Surgeon: Toni Arthurs, MD;  Location: [facility] OR;  Service: Orthopedics;  Laterality: Left;  . I & D EXTREMITY Left "multiple"   leg  . 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 IM NAILING TIBIA Left ~ 2012  . LEFT HEART CATH AND CORONARY ANGIOGRAPHY N/A 04/08/2017   Procedure: LEFT HEART CATH AND CORONARY ANGIOGRAPHY;  Surgeon: Runell Gess, MD;  Location: [facility];  Service: Cardiovascular;  Laterality: N/A;  . SHOULDER ARTHROSCOPY W/ ROTATOR CUFF REPAIR Left    "and bone spurs"  . TIBIAL IM ROD REMOVAL Left 01/07/2014  . TOE AMPUTATION Right ~ 2011   "great toe"  . VENA CAVA FILTER PLACEMENT  2012  . VENA CAVA FILTER PLACEMENT N/A 11/20/2016   Procedure: INSERTION VENA-CAVA FILTER;  Surgeon: Sherren Kerns, MD;  Location: [facility] OR;  Service: Vascular;  Laterality: N/A;  . WOUND DEBRIDEMENT Left 01/07/2014   "tibia"    Family History  Problem Relation Age of Onset  .  COPD Mother   . Heart disease Mother   . Lung cancer Mother   . Retinal detachment Father   . Alcoholism Brother   . Heart disease Brother   . COPD Brother   . Diabetes Brother   . Hypertension Brother   . Stroke Brother     Social History   Socioeconomic History  . Marital status: Divorced    Spouse name: Not on file  . Number of children: Not on file  . Years of education: 56  . Highest education level: Not on file  Occupational History  . Occupation: DISABLITY    Employer: UNEMPLOYED  Tobacco Use  . Smoking status: Never Smoker  . Smokeless tobacco: Never Used  Vaping Use  . Vaping Use: Never used  Substance and Sexual Activity  . Alcohol use: No    Comment: 01/07/2014 'might have a wine cooler a couple times/yr"  . Drug use: No  . Sexual activity: Never  Other Topics  Concern  . Not on file  Social History Narrative  . Not on file   Social Determinants of Health   Financial Resource Strain: Not on file  Food Insecurity: Not on file  Transportation Needs: Not on file  Physical Activity: Not on file  Stress: Not on file  Social Connections: Not on file  Intimate Partner Violence: Not on file    Outpatient Medications Prior to Visit  Medication Sig Dispense Refill  . acarbose (PRECOSE) 50 MG tablet Take 50 mg by mouth 3 (three) times daily with meals.    [name] alum & mag hydroxide-simeth (MAALOX/MYLANTA) 200-200-20 MG/5ML suspension Take 15 mLs by mouth every 6 (six) hours as needed for indigestion or heartburn. 355 mL 0  . amLODipine (NORVASC) 10 MG tablet Take 1 tablet (10 mg total) by mouth daily. 30 tablet 2  . apixaban (ELIQUIS) 5 MG TABS tablet Take 5 mg by mouth 2 (two) times daily.    [name] aspirin 81 MG EC tablet Take 1 tablet (81 mg total) by mouth daily. 30 tablet 0  . atorvastatin (LIPITOR) 80 MG tablet Take 1 tablet (80 mg total) by mouth daily at 6 PM. (Patient taking differently: Take 80 mg by mouth daily.) 30 tablet 0  . clopidogrel (PLAVIX) 75 MG tablet Take 75 mg by mouth daily.    [name] docusate sodium (COLACE) 100 MG capsule Take 100 mg by mouth daily.    . furosemide (LASIX) 20 MG tablet Take 1 tablet (20 mg total) by mouth daily. 30 tablet 0  . gabapentin (NEURONTIN) 300 MG capsule TAKE 1 CAPSULE BY MOUTH 3 TIMES A DAY 90 capsule 6  . insulin aspart (NOVOLOG FLEXPEN) 100 UNIT/ML FlexPen Inject 20 Units into the skin 3 (three) times daily with meals. 15 mL 11  . insulin degludec (TRESIBA FLEXTOUCH) 200 UNIT/ML FlexTouch Pen Inject 100 Units into the skin daily. 15 mL 11  . metoprolol tartrate (LOPRESSOR) 25 MG tablet Take 1 tablet (25 mg total) by mouth 2 (two) times daily. 60 tablet 0  . nitroGLYCERIN (NITROSTAT) 0.4 MG SL tablet Place 1 tablet (0.4 mg total) under the tongue every 5 (five) minutes as needed for chest pain. 30 tablet 0  .  ONETOUCH VERIO test strip Use to test blood sugar twice daily  4  . pantoprazole (PROTONIX) 40 MG tablet Take 1 tablet (40 mg total) by mouth 2 (two) times daily before a meal. 60 tablet 0  . promethazine (PHENERGAN) 25 MG/ML injection Inject 0.5 mLs (12.5  mg total) into the muscle every 6 (six) hours as needed for nausea or vomiting. 25 mL 11  . promethazine (PHENERGAN) 25 MG tablet TAKE 1 TO 2 TABLETS BY MOUTH EVERY 4 TO 6 HOURS AS NEEDED FOR NAUSEA 240 tablet 1  . traMADol (ULTRAM) 50 MG tablet Take 100 mg by mouth every 6 (six) hours as needed for moderate pain.     . ranolazine (RANEXA) 500 MG 12 hr tablet Take 1 tablet (500 mg total) by mouth 2 (two) times daily. (Patient not taking: No sig reported) 60 tablet 0   No facility-administered medications prior to visit.    Allergies  Allergen Reactions  . Gabapentin Hives, Itching, Nausea And Vomiting and Rash    REACTION: rash/hives (per patient, was a reaction to an inactive ingredient in another mgf brand). The patient stated that he does take this medication now and it doesn't cause a rash any more. LOW DOSE  . Relafen [Nabumetone] Nausea And Vomiting and Rash  . Codeine Nausea And Vomiting and Rash    ROS Review of Systems  Constitutional: Negative.   Respiratory: Negative.   Cardiovascular: Negative.   Gastrointestinal: Negative for nausea and vomiting.  Musculoskeletal: Positive for arthralgias.  Psychiatric/Behavioral: Negative.       Objective:    Physical Exam Vitals and nursing note reviewed.  Constitutional:      Appearance: Normal appearance.  HENT:     Head: Normocephalic and atraumatic.     Right Ear: External ear normal.     Left Ear: External ear normal.  Eyes:     General:        Right eye: No discharge.        Left eye: No discharge.     Conjunctiva/sclera: Conjunctivae normal.  Pulmonary:     Effort: Pulmonary effort is normal.  Musculoskeletal:     Right hand: Swelling, deformity and bony  tenderness present. Decreased range of motion.     Left hand: Swelling, deformity and bony tenderness present. Decreased range of motion.  Neurological:     Mental Status: He is alert and oriented to person, place, and time.  Psychiatric:        Mood and Affect: Mood normal.        Behavior: Behavior normal.     BP (!) 124/97   Pulse 79   Temp 97.8 F (36.6 C) (Temporal)   Ht 6\' 3"  (1.905 m)   Wt 235 lb (106.6 kg)   SpO2 97%   BMI 29.37 kg/m  Wt Readings from Last 3 Encounters:  11/21/20 235 lb (106.6 kg)  09/01/20 234 lb (106.1 kg)  04/11/20 235 lb (106.6 kg)     Health Maintenance Due  Topic Date Due  . Pneumococcal Vaccine 30-82 Years old (1 of 2 - PPSV23) Never done  . FOOT EXAM  Never done  . Hepatitis C Screening  Never done  . Zoster Vaccines- Shingrix (1 of 2) Never done  . PNA vac Low Risk Adult (1 of 2 - PCV13) Never done  . COVID-19 Vaccine (3 - Booster for Moderna series) 02/23/2020  . OPHTHALMOLOGY EXAM  10/20/2020    There are no preventive care reminders to display for this patient.  Lab Results  Component Value Date   TSH 0.561 06/09/2019   Lab Results  Component Value Date   WBC 6.1 09/01/2020   HGB 14.2 09/01/2020   HCT 41.8 09/01/2020   MCV 89.9 09/01/2020   PLT 141.0 (L) 09/01/2020  Lab Results  Component Value Date   NA 132 (L) 09/01/2020   K 4.3 09/01/2020   CO2 27 09/01/2020   GLUCOSE 333 (H) 09/01/2020   BUN 19 09/01/2020   CREATININE 0.85 09/01/2020   BILITOT 0.4 09/01/2020   ALKPHOS 78 09/01/2020   AST 14 09/01/2020   ALT 17 09/01/2020   PROT 6.8 09/01/2020   ALBUMIN 3.6 09/01/2020   CALCIUM 9.3 09/01/2020   ANIONGAP 15 01/16/2020   GFR 89.92 09/01/2020   Lab Results  Component Value Date   CHOL 167 09/01/2020   Lab Results  Component Value Date   HDL 38.60 (L) 09/01/2020   Lab Results  Component Value Date   LDLCALC 101 (H) 09/01/2020   Lab Results  Component Value Date   TRIG 136.0 09/01/2020   Lab  Results  Component Value Date   CHOLHDL 4 09/01/2020   Lab Results  Component Value Date   HGBA1C 12.0 (A) 06/22/2020      Assessment & Plan:   Problem List Items Addressed This Visit      Digestive   Nausea & vomiting - Primary   Relevant Medications   promethazine (PHENERGAN) 25 MG tablet    Other Visit Diagnoses    Osteoarthritis of both hands, unspecified osteoarthritis type       Relevant Medications   traMADol (ULTRAM) 50 MG tablet   diclofenac Sodium (VOLTAREN) 1 % GEL      Meds ordered this encounter  Medications  . promethazine (PHENERGAN) 25 MG tablet    Sig: TAKE 1 TO 2 TABLETS BY MOUTH EVERY 4 TO 6 HOURS AS NEEDED FOR NAUSEA    Dispense:  240 tablet    Refill:  1  . traMADol (ULTRAM) 50 MG tablet    Sig: Take 2 tablets (100 mg total) by mouth every 6 (six) hours as needed for moderate pain.    Dispense:  30 tablet    Refill:  1  . diclofenac Sodium (VOLTAREN) 1 % GEL    Sig: Rub a small grape sized dollop into the sore joints of hands up to 3 times daily as needed for pain.    Dispense:  150 g    Refill:  1    Follow-up: Return in about 3 months (around 02/21/2021).  Suggested that he try Voltaren gel and he said that he would.  Prescription for Phenergan tablets given.  Prescription for tramadol given.  He assures me that he does not mix the Phenergan with the tramadol.  Spent 30 minutes with this patient and his chart also advising him on the use of Phenergan pills and tramadol.  Mliss SaxWilliam Alfred Joliana Claflin, MD

## 2020-11-22 ENCOUNTER — Ambulatory Visit (INDEPENDENT_AMBULATORY_CARE_PROVIDER_SITE_OTHER): Payer: HMO | Admitting: *Deleted

## 2020-11-22 DIAGNOSIS — Z0000 Encounter for general adult medical examination without abnormal findings: Secondary | ICD-10-CM | POA: Diagnosis not present

## 2020-11-22 NOTE — Progress Notes (Addendum)
Subjective:   Samuel [name] is a 70 y.o. male who presents for Medicare Annual/Subsequent preventive examination.  Virtual Visit via Telephone Note  I connected with  Samuel [name] on 11/22/20 at  3:45 PM EDT by telephone and verified that I am speaking with the correct person using two identifiers.  Location: Patient: HOME Provider: Telephone  Persons participating in the virtual visit: patient/Nurse Health Advisor   I discussed the limitations, risks, security and privacy concerns of performing an evaluation and management service by telephone and the availability of in person appointments. The patient expressed understanding and agreed to proceed.  Interactive audio and video telecommunications were attempted between this nurse and patient, however failed, due to patient having technical difficulties OR patient did not have access to video capability.  We continued and completed visit with audio only.  Some vital signs may be absent or patient reported.   Remi Haggard, LPN   Review of Systems    NA Cardiac Risk Factors include: advanced age (>61men, >14 women);diabetes mellitus;dyslipidemia;hypertension;male gender     Objective:    There were no vitals filed for this visit. There is no height or weight on file to calculate BMI.  Advanced Directives 11/22/2020 01/16/2020 01/16/2020 10/16/2019 09/23/2019 08/09/2019 08/09/2019  Does Patient Have a Medical Advance Directive? Yes No Unable to assess, patient is non-responsive or altered mental status No No No No  Type of Advance Directive Healthcare Power of Attorney - - - - - -  Does patient want to make changes to medical advance directive? - - - - - - -  Copy of Healthcare Power of Attorney in Chart? No - copy requested - - - - - -  Would patient like information on creating a medical advance directive? - No - Guardian declined - No - Patient declined No - Patient declined No - Patient declined No - Patient declined  Pre-existing out  of facility DNR order (yellow form or pink MOST form) - - - - - - -    Current Medications (verified) Outpatient Encounter Medications as of 11/22/2020  Medication Sig   acarbose (PRECOSE) 50 MG tablet Take 50 mg by mouth 3 (three) times daily with meals.   alum & mag hydroxide-simeth (MAALOX/MYLANTA) 200-200-20 MG/5ML suspension Take 15 mLs by mouth every 6 (six) hours as needed for indigestion or heartburn.   amLODipine (NORVASC) 10 MG tablet Take 1 tablet (10 mg total) by mouth daily.   apixaban (ELIQUIS) 5 MG TABS tablet Take 5 mg by mouth 2 (two) times daily.   aspirin 81 MG EC tablet Take 1 tablet (81 mg total) by mouth daily.   atorvastatin (LIPITOR) 80 MG tablet Take 1 tablet (80 mg total) by mouth daily at 6 PM. (Patient taking differently: Take 80 mg by mouth daily.)   clopidogrel (PLAVIX) 75 MG tablet Take 75 mg by mouth daily.   diclofenac Sodium (VOLTAREN) 1 % GEL Rub a small grape sized dollop into the sore joints of hands up to 3 times daily as needed for pain.   docusate sodium (COLACE) 100 MG capsule Take 100 mg by mouth daily.   furosemide (LASIX) 20 MG tablet Take 1 tablet (20 mg total) by mouth daily.   gabapentin (NEURONTIN) 300 MG capsule TAKE 1 CAPSULE BY MOUTH 3 TIMES A DAY   insulin aspart (NOVOLOG FLEXPEN) 100 UNIT/ML FlexPen Inject 20 Units into the skin 3 (three) times daily with meals.   insulin degludec (TRESIBA FLEXTOUCH) 200 UNIT/ML FlexTouch  Pen Inject 100 Units into the skin daily.   metoprolol tartrate (LOPRESSOR) 25 MG tablet Take 1 tablet (25 mg total) by mouth 2 (two) times daily.   nitroGLYCERIN (NITROSTAT) 0.4 MG SL tablet Place 1 tablet (0.4 mg total) under the tongue every 5 (five) minutes as needed for chest pain.   ONETOUCH VERIO test strip Use to test blood sugar twice daily   pantoprazole (PROTONIX) 40 MG tablet Take 1 tablet (40 mg total) by mouth 2 (two) times daily before a meal.   promethazine (PHENERGAN) 25 MG tablet TAKE 1 TO 2 TABLETS BY MOUTH  EVERY 4 TO 6 HOURS AS NEEDED FOR NAUSEA   traMADol (ULTRAM) 50 MG tablet Take 2 tablets (100 mg total) by mouth every 6 (six) hours as needed for moderate pain.   promethazine (PHENERGAN) 25 MG/ML injection Inject 0.5 mLs (12.5 mg total) into the muscle every 6 (six) hours as needed for nausea or vomiting. (Patient not taking: Reported on 11/22/2020)   ranolazine (RANEXA) 500 MG 12 hr tablet Take 1 tablet (500 mg total) by mouth 2 (two) times daily. (Patient not taking: No sig reported)   No facility-administered encounter medications on file as of 11/22/2020.    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 of 2012   Shortness of breath    exertion   Type II diabetes mellitus (HCC)    Past Surgical History:  Procedure Laterality Date   AMPUTATION Left 03/08/2015   Procedure:  LEFT AMPUTATION BELOW KNEE;  Surgeon: Toni ArthursJohn Hewitt, MD;  Location: [facility];  Service: Orthopedics;  Laterality: Left;   APPLICATION OF WOUND VAC Left 01/07/2014   CHOLECYSTECTOMY  06/2010   CORONARY ANGIOPLASTY WITH STENT PLACEMENT  05/2009   "1"   FOOT SURGERY Left 2010   "for Charcot's joint"   HARDWARE REMOVAL Left 01/07/2014   Procedure: LEFT LEG REMOVAL OF DEEP IMPLANT AND SEQUESTRECTOMY; APPLICATION OF WOUND VAC ;  Surgeon: Toni ArthursJohn Hewitt, MD;  Location: [facility] OR;  Service: Orthopedics;   Laterality: Left;   I & D EXTREMITY Left "multiple"   leg   I & D EXTREMITY Left 01/07/2014   Procedure: 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;  Surgeon: Toni ArthursJohn Hewitt, MD;  Location: [facility] OR;  Service: Orthopedics;  Laterality: Left;   IM NAILING TIBIA Left ~ 2012   LEFT HEART CATH AND CORONARY ANGIOGRAPHY N/A 04/08/2017   Procedure: LEFT HEART CATH AND CORONARY ANGIOGRAPHY;  Surgeon: Runell GessBerry, Jonathan J, MD;  Location: [facility];  Service: Cardiovascular;  Laterality: N/A;   SHOULDER ARTHROSCOPY W/ ROTATOR CUFF REPAIR Left    "and bone spurs"   TIBIAL IM ROD REMOVAL Left 01/07/2014   TOE AMPUTATION Right ~ 2011   "  great toe"   VENA CAVA FILTER PLACEMENT  2012   VENA CAVA FILTER PLACEMENT N/A 11/20/2016   Procedure: INSERTION VENA-CAVA FILTER;  Surgeon: Sherren Kerns, MD;  Location: [facility];  Service: Vascular;  Laterality: N/A;   WOUND DEBRIDEMENT Left 01/07/2014   "tibia"   Family History  Problem Relation Age of Onset   COPD Mother    Heart disease Mother    Lung cancer Mother    Retinal detachment Father    Alcoholism Brother    Heart disease Brother    COPD Brother    Diabetes Brother    Hypertension Brother    Stroke Brother    Social History   Socioeconomic History   Marital status: Divorced    Spouse name: Not on file   Number of children: Not on file   Years of education: 12   Highest education level: Not on file  Occupational History   Occupation: DISABLITY    Employer: UNEMPLOYED  Tobacco Use   Smoking status: Never Smoker   Smokeless tobacco: Never Used  Building services engineer Use: Never used  Substance and Sexual Activity   Alcohol use: No    Comment: 01/07/2014 'might have a wine cooler a couple times/yr"   Drug use: No   Sexual activity: Never  Other Topics Concern   Not on file  Social History Narrative   Not on file   Social Determinants of Health   Financial Resource Strain: Low Risk    Difficulty of Paying Living Expenses: Not  hard at all  Food Insecurity: No Food Insecurity   Worried About Programme researcher, broadcasting/film/video in the Last Year: Never true   Ran Out of Food in the Last Year: Never true  Transportation Needs: Not on file  Physical Activity: Inactive   Days of Exercise per Week: 0 days   Minutes of Exercise per Session: 0 min  Stress: No Stress Concern Present   Feeling of Stress : Not at all  Social Connections: Moderately Isolated   Frequency of Communication with Friends and Family: More than three times a week   Frequency of Social Gatherings with Friends and Family: More than three times a week   Attends Religious Services: More than 4 times per year   Active Member of [employer] or Organizations: No   Attends Engineer, structural: Never   Marital Status: Divorced    Tobacco Counseling Counseling given: Not Answered   Clinical Intake:  Pre-visit preparation completed: Yes  Pain : No/denies pain     Nutritional Risks: None Diabetes: Yes CBG done?: No Did pt. bring in CBG monitor from home?: No  How often do you need to have someone help you when you read instructions, pamphlets, or other written materials from your doctor or pharmacy?: 1 - Never  Diabetic?yes  Nutrition Risk Assessment:  Has the patient had any N/V/D within the last 2 months?  No  Does the patient have any non-healing wounds?  No  Has the patient had any unintentional weight loss or weight gain?  No   Diabetes:  Is the patient diabetic?  Yes  If diabetic, was a CBG obtained today?  No  Did the patient bring in their glucometer from home?  No  How often do you monitor your CBG's? Two times a day.   Financial Strains and Diabetes Management:  Are you having any financial strains with the device, your supplies or your medication? No .  Does the  patient want to be seen by Chronic Care Management for management of their diabetes?  Yes  Would the patient like to be referred to a Nutritionist or for Diabetic  Management?  No   Diabetic Exams:  Diabetic Eye Exam: Completed no. Overdue for diabetic eye exam. Pt has been advised about the importance in completing this exam. A referral has been placed today. Message sent to referral coordinator for scheduling purposes. Advised pt to expect a call from office referred to regarding appt.  Diabetic Foot Exam: Completed 07-02-2020. Pt has been advised about the importance in completing this exam. Pt is scheduled for diabetic foot exam on  .   Interpreter Needed?: No  Comments: Remi Haggard LPN   Activities of Daily Living In your present state of health, do you have any difficulty performing the following activities: 11/22/2020  Hearing? N  Vision? N  Difficulty concentrating or making decisions? N  Walking or climbing stairs? Y  Comment has prosthetic left leg  Dressing or bathing? N  Doing errands, shopping? N  Preparing Food and eating ? N  Using the Toilet? N  In the past six months, have you accidently leaked urine? N  Do you have problems with loss of bowel control? N  Managing your Medications? N  Managing your Finances? N  Housekeeping or managing your Housekeeping? N  Some recent data might be hidden    Patient Care Team: Mliss Sax, MD as PCP - General (Family Medicine) Cliffton Asters, MD as Consulting Physician (Infectious Diseases) [facility] as Referring Physician (Pharmacist) Toni Arthurs, MD as Consulting Physician (Orthopedic Surgery)  Indicate any recent Medical Services you may have received from other than [facility] providers in the past year (date may be approximate).     Assessment:   This is a routine wellness examination for Samuel [name].  Hearing/Vision screen  Hearing Screening   125Hz  250Hz  500Hz  1000Hz  2000Hz  3000Hz  4000Hz  6000Hz  8000Hz   Right ear:           Left ear:           Vision Screening Comments: Dr. ophthalmology  Dr.    Dietary issues and exercise activities  discussed: Current Exercise Habits: The patient does not participate in regular exercise at present, Exercise limited by: orthopedic condition(s);Other - see comments (prosthetics left leg)  Goals Addressed             This Visit's Progress    Patient Stated       Patient  would like to get walking with prosthetic       Depression Screen PHQ 2/9 Scores 11/22/2020 04/11/2020 04/11/2020 07/28/2018 06/03/2018 09/25/2017 06/14/2017  PHQ - 2 Score 0 0 0 1 1 0 0  PHQ- 9 Score - 9 - - - - -    Fall Risk Fall Risk  11/22/2020 04/11/2020 07/28/2018 09/25/2017 08/27/2017  Falls in the past year? 0 0 0 No (No Data)  Comment - - - - Patient denies new/ recent falls  Number falls in past yr: 0 - - - -  Injury with Fall? 0 - - - -  Risk Factor Category  - - - - -  Risk for fall due to : Orthopedic patient - - - -  Follow up Falls evaluation completed;Falls prevention discussed - - - -    FALL RISK PREVENTION PERTAINING TO THE HOME:  Any stairs in or around the home? Yes  If so, are there any without handrails? Yes  Home free of loose throw rugs in walkways, pet beds, electrical cords, etc? Yes  Adequate lighting in your home to reduce risk of falls? Yes   ASSISTIVE DEVICES UTILIZED TO PREVENT FALLS:  Life alert? No  Use of a cane, walker or w/c? Yes  Grab bars in the bathroom? Yes  Shower chair or bench in shower? Yes  Elevated toilet seat or a handicapped toilet? Yes   TIMED UP AND GO:  Was the test performed? No .    Cognitive Function:  Normal cognitive status assessed by direct observation by this Nurse Health Advisor. No abnormalities found.         Immunizations Immunization History  Administered Date(s) Administered   Fluad Quad(high Dose 65+) 04/11/2020   Influenza,inj,Quad PF,6+ Mos 03/02/2014   Moderna Sars-Covid-2 Vaccination 08/26/2019, 09/23/2019   PPD Test 03/15/2015   Tdap 02/08/2012    TDAP status: Up to date  Flu Vaccine status: Up to  date  Pneumococcal vaccine status: Up to date  Covid-19 vaccine status: Completed vaccines  Qualifies for Shingles Vaccine? y  Zostavax completed No   Shingrix Completed?: No.    Education has been provided regarding the importance of this vaccine. Patient has been advised to call insurance company to determine out of pocket expense if they have not yet received this vaccine. Advised may also receive vaccine at local pharmacy or Health Dept. Verbalized acceptance and understanding.  Screening Tests Health Maintenance  Topic Date Due   Pneumococcal Vaccine 40-1 Years old (1 of 2 - PPSV23) Never done   FOOT EXAM  Never done   Hepatitis C Screening  Never done   Zoster Vaccines- Shingrix (1 of 2) Never done   PNA vac Low Risk Adult (1 of 2 - PCV13) Never done   COVID-19 Vaccine (3 - Booster for Moderna series) 02/23/2020   OPHTHALMOLOGY EXAM  10/20/2020   HEMOGLOBIN A1C  12/20/2020   INFLUENZA VACCINE  01/16/2021   URINE MICROALBUMIN  04/11/2021   TETANUS/TDAP  02/07/2022   COLONOSCOPY (Pts 45-5yrs Insurance coverage will need to be confirmed)  11/01/2022   HPV VACCINES  Aged Out    Health Maintenance  Health Maintenance Due  Topic Date Due   Pneumococcal Vaccine 55-52 Years old (1 of 2 - PPSV23) Never done   FOOT EXAM  Never done   Hepatitis C Screening  Never done   Zoster Vaccines- Shingrix (1 of 2) Never done   PNA vac Low Risk Adult (1 of 2 - PCV13) Never done   COVID-19 Vaccine (3 - Booster for Moderna series) 02/23/2020   OPHTHALMOLOGY EXAM  10/20/2020    Colorectal cancer screening: Type of screening: Colonoscopy. Completed 10-31-2012. Repeat every 10 years  Lung Cancer Screening: (Low Dose CT Chest recommended if Age 28-80 years, 30 pack-year currently smoking OR have quit w/in 15years.) does not qualify.   Lung Cancer Screening Referral: na  Additional Screening:  Hepatitis C Screening: does qualify;  Vision Screening: Recommended annual ophthalmology exams  for early detection of glaucoma and other disorders of the eye. Is the patient up to date with their annual eye exam?  No  Who is the provider or what is the name of the office in which the patient attends annual eye exams? Dr. Hecker/ Dr. Ashley Royalty If pt is not established with a provider, would they like to be referred to a provider to establish care? Yes .   Dental Screening: Recommended annual dental exams for proper oral hygiene  [employer]  Referral / Chronic Care Management: CRR required this visit?  No   CCM required this visit?  No      Plan:     I have personally reviewed and noted the following in the patient's chart:   Medical and social history Use of alcohol, tobacco or illicit drugs  Current medications and supplements including opioid prescriptions. Patient is not currently taking opioid prescriptions. Functional ability and status Nutritional status Physical activity Advanced directives List of other physicians Hospitalizations, surgeries, and ER visits in previous 12 months Vitals Screenings to include cognitive, depression, and falls Referrals and appointments  In addition, I have reviewed and discussed with patient certain preventive protocols, quality metrics, and best practice recommendations. A written personalized care plan for preventive services as well as general preventive health recommendations were provided to patient.     Remi Haggard, LPN   [id number]   Nurse Notes: NA  Reviewed.

## 2020-11-22 NOTE — Patient Instructions (Signed)
Samuel [name] , Thank you for taking time to come for your Medicare Wellness Visit. I appreciate your ongoing commitment to your health goals. Please review the following plan we discussed and let me know if I can assist you in the future.   Screening recommendations/referrals: Colonoscopy: completed 10-31-2012 Recommended yearly ophthalmology/optometry visit for glaucoma screening and checkup Recommended yearly dental visit for hygiene and checkup  Vaccinations: Influenza vaccine: up to date Pneumococcal vaccine: up to date Tdap vaccine:up to date Shingles vaccine:  Education provided    Advanced directives: no  Education provided  Conditions/risks identified: na  Next appointment: 01-02-2021 @ 4:00 Doreene Burke  Preventive Care 65 Years and Older, Male Preventive care refers to lifestyle choices and visits with your health care provider that can promote health and wellness. What does preventive care include?  A yearly physical exam. This is also called an annual well check.  Dental exams once or twice a year.  Routine eye exams. Ask your health care provider how often you should have your eyes checked.  Personal lifestyle choices, including:  Daily care of your teeth and gums.  Regular physical activity.  Eating a healthy diet.  Avoiding tobacco and drug use.  Limiting alcohol use.  Practicing safe sex.  Taking low doses of aspirin every day.  Taking vitamin and mineral supplements as recommended by your health care provider. What happens during an annual well check? The services and screenings done by your health care provider during your annual well check will depend on your age, overall health, lifestyle risk factors, and family history of disease. Counseling  Your health care provider may ask you questions about your:  Alcohol use.  Tobacco use.  Drug use.  Emotional well-being.  Home and relationship well-being.  Sexual activity.  Eating habits.  History  of falls.  Memory and ability to understand (cognition).  Work and work Astronomer. Screening  You may have the following tests or measurements:  Height, weight, and BMI.  Blood pressure.  Lipid and cholesterol levels. These may be checked every 5 years, or more frequently if you are over 30 years old.  Skin check.  Lung cancer screening. You may have this screening every year starting at age 80 if you have a 30-pack-year history of smoking and currently smoke or have quit within the past 15 years.  Fecal occult blood test (FOBT) of the stool. You may have this test every year starting at age 90.  Flexible sigmoidoscopy or colonoscopy. You may have a sigmoidoscopy every 5 years or a colonoscopy every 10 years starting at age 28.  Prostate cancer screening. Recommendations will vary depending on your family history and other risks.  Hepatitis C blood test.  Hepatitis B blood test.  Sexually transmitted disease (STD) testing.  Diabetes screening. This is done by checking your blood sugar (glucose) after you have not eaten for a while (fasting). You may have this done every 1-3 years.  Abdominal aortic aneurysm (AAA) screening. You may need this if you are a current or former smoker.  Osteoporosis. You may be screened starting at age 64 if you are at high risk. Talk with your health care provider about your test results, treatment options, and if necessary, the need for more tests. Vaccines  Your health care provider may recommend certain vaccines, such as:  Influenza vaccine. This is recommended every year.  Tetanus, diphtheria, and acellular pertussis (Tdap, Td) vaccine. You may need a Td booster every 10 years.  Zoster vaccine. You  may need this after age 68.  Pneumococcal 13-valent conjugate (PCV13) vaccine. One dose is recommended after age 44.  Pneumococcal polysaccharide (PPSV23) vaccine. One dose is recommended after age 9. Talk to your health care provider  about which screenings and vaccines you need and how often you need them. This information is not intended to replace advice given to you by your health care provider. Make sure you discuss any questions you have with your health care provider. Document Released: 07/01/2015 Document Revised: 02/22/2016 Document Reviewed: 04/05/2015 Elsevier Interactive Patient Education  2017 [place] Prevention in the Home Falls can cause injuries. They can happen to people of all ages. There are many things you can do to make your home safe and to help prevent falls. What can I do on the outside of my home?  Regularly fix the edges of walkways and driveways and fix any cracks.  Remove anything that might make you trip as you walk through a door, such as a raised step or threshold.  Trim any bushes or trees on the path to your home.  Use bright outdoor lighting.  Clear any walking paths of anything that might make someone trip, such as rocks or tools.  Regularly check to see if handrails are loose or broken. Make sure that both sides of any steps have handrails.  Any raised decks and porches should have guardrails on the edges.  Have any leaves, snow, or ice cleared regularly.  Use sand or salt on walking paths during winter.  Clean up any spills in your garage right away. This includes oil or grease spills. What can I do in the bathroom?  Use night lights.  Install grab bars by the toilet and in the tub and shower. Do not use towel bars as grab bars.  Use non-skid mats or decals in the tub or shower.  If you need to sit down in the shower, use a plastic, non-slip stool.  Keep the floor dry. Clean up any water that spills on the floor as soon as it happens.  Remove soap buildup in the tub or shower regularly.  Attach bath mats securely with double-sided non-slip rug tape.  Do not have throw rugs and other things on the floor that can make you trip. What can I do in the  bedroom?  Use night lights.  Make sure that you have a light by your bed that is easy to reach.  Do not use any sheets or blankets that are too big for your bed. They should not hang down onto the floor.  Have a firm chair that has side arms. You can use this for support while you get dressed.  Do not have throw rugs and other things on the floor that can make you trip. What can I do in the kitchen?  Clean up any spills right away.  Avoid walking on wet floors.  Keep items that you use a lot in easy-to-reach places.  If you need to reach something above you, use a strong step stool that has a grab bar.  Keep electrical cords out of the way.  Do not use floor polish or wax that makes floors slippery. If you must use wax, use non-skid floor wax.  Do not have throw rugs and other things on the floor that can make you trip. What can I do with my stairs?  Do not leave any items on the stairs.  Make sure that there are handrails on both  sides of the stairs and use them. Fix handrails that are broken or loose. Make sure that handrails are as long as the stairways.  Check any carpeting to make sure that it is firmly attached to the stairs. Fix any carpet that is loose or worn.  Avoid having throw rugs at the top or bottom of the stairs. If you do have throw rugs, attach them to the floor with carpet tape.  Make sure that you have a light switch at the top of the stairs and the bottom of the stairs. If you do not have them, ask someone to add them for you. What else can I do to help prevent falls?  Wear shoes that:  Do not have high heels.  Have rubber bottoms.  Are comfortable and fit you well.  Are closed at the toe. Do not wear sandals.  If you use a stepladder:  Make sure that it is fully opened. Do not climb a closed stepladder.  Make sure that both sides of the stepladder are locked into place.  Ask someone to hold it for you, if possible.  Clearly mark and make  sure that you can see:  Any grab bars or handrails.  First and last steps.  Where the edge of each step is.  Use tools that help you move around (mobility aids) if they are needed. These include:  Canes.  Walkers.  Scooters.  Crutches.  Turn on the lights when you go into a dark area. Replace any light bulbs as soon as they burn out.  Set up your furniture so you have a clear path. Avoid moving your furniture around.  If any of your floors are uneven, fix them.  If there are any pets around you, be aware of where they are.  Review your medicines with your doctor. Some medicines can make you feel dizzy. This can increase your chance of falling. Ask your doctor what other things that you can do to help prevent falls. This information is not intended to replace advice given to you by your health care provider. Make sure you discuss any questions you have with your health care provider. Document Released: 03/31/2009 Document Revised: 11/10/2015 Document Reviewed: 07/09/2014 Elsevier Interactive Patient Education  2017 [employer].

## 2020-12-08 ENCOUNTER — Inpatient Hospital Stay (HOSPITAL_COMMUNITY): Payer: HMO

## 2020-12-08 ENCOUNTER — Emergency Department (HOSPITAL_COMMUNITY): Payer: HMO

## 2020-12-08 ENCOUNTER — Inpatient Hospital Stay (HOSPITAL_COMMUNITY)
Admission: EM | Admit: 2020-12-08 | Discharge: 2021-01-16 | DRG: 870 | Disposition: E | Payer: HMO | Attending: Pulmonary Disease | Admitting: Pulmonary Disease

## 2020-12-08 DIAGNOSIS — J9601 Acute respiratory failure with hypoxia: Secondary | ICD-10-CM | POA: Diagnosis not present

## 2020-12-08 DIAGNOSIS — R6521 Severe sepsis with septic shock: Secondary | ICD-10-CM | POA: Diagnosis not present

## 2020-12-08 DIAGNOSIS — Z743 Need for continuous supervision: Secondary | ICD-10-CM | POA: Diagnosis not present

## 2020-12-08 DIAGNOSIS — Z4659 Encounter for fitting and adjustment of other gastrointestinal appliance and device: Secondary | ICD-10-CM

## 2020-12-08 DIAGNOSIS — Z7901 Long term (current) use of anticoagulants: Secondary | ICD-10-CM

## 2020-12-08 DIAGNOSIS — Z8614 Personal history of Methicillin resistant Staphylococcus aureus infection: Secondary | ICD-10-CM

## 2020-12-08 DIAGNOSIS — I700 Atherosclerosis of aorta: Secondary | ICD-10-CM | POA: Diagnosis not present

## 2020-12-08 DIAGNOSIS — I4891 Unspecified atrial fibrillation: Secondary | ICD-10-CM | POA: Diagnosis present

## 2020-12-08 DIAGNOSIS — K219 Gastro-esophageal reflux disease without esophagitis: Secondary | ICD-10-CM | POA: Diagnosis present

## 2020-12-08 DIAGNOSIS — Z20822 Contact with and (suspected) exposure to covid-19: Secondary | ICD-10-CM | POA: Diagnosis present

## 2020-12-08 DIAGNOSIS — K3184 Gastroparesis: Secondary | ICD-10-CM | POA: Diagnosis not present

## 2020-12-08 DIAGNOSIS — I10 Essential (primary) hypertension: Secondary | ICD-10-CM | POA: Diagnosis present

## 2020-12-08 DIAGNOSIS — N179 Acute kidney failure, unspecified: Secondary | ICD-10-CM | POA: Diagnosis present

## 2020-12-08 DIAGNOSIS — I38 Endocarditis, valve unspecified: Secondary | ICD-10-CM | POA: Diagnosis not present

## 2020-12-08 DIAGNOSIS — E1165 Type 2 diabetes mellitus with hyperglycemia: Secondary | ICD-10-CM | POA: Diagnosis not present

## 2020-12-08 DIAGNOSIS — E872 Acidosis, unspecified: Secondary | ICD-10-CM

## 2020-12-08 DIAGNOSIS — A419 Sepsis, unspecified organism: Secondary | ICD-10-CM | POA: Diagnosis not present

## 2020-12-08 DIAGNOSIS — R7881 Bacteremia: Secondary | ICD-10-CM | POA: Diagnosis not present

## 2020-12-08 DIAGNOSIS — G319 Degenerative disease of nervous system, unspecified: Secondary | ICD-10-CM | POA: Diagnosis not present

## 2020-12-08 DIAGNOSIS — E1110 Type 2 diabetes mellitus with ketoacidosis without coma: Secondary | ICD-10-CM | POA: Diagnosis present

## 2020-12-08 DIAGNOSIS — Z01818 Encounter for other preprocedural examination: Secondary | ICD-10-CM

## 2020-12-08 DIAGNOSIS — E669 Obesity, unspecified: Secondary | ICD-10-CM | POA: Diagnosis not present

## 2020-12-08 DIAGNOSIS — J9690 Respiratory failure, unspecified, unspecified whether with hypoxia or hypercapnia: Secondary | ICD-10-CM | POA: Diagnosis not present

## 2020-12-08 DIAGNOSIS — Z9289 Personal history of other medical treatment: Secondary | ICD-10-CM

## 2020-12-08 DIAGNOSIS — E1143 Type 2 diabetes mellitus with diabetic autonomic (poly)neuropathy: Secondary | ICD-10-CM | POA: Diagnosis not present

## 2020-12-08 DIAGNOSIS — R571 Hypovolemic shock: Secondary | ICD-10-CM | POA: Diagnosis present

## 2020-12-08 DIAGNOSIS — Z7982 Long term (current) use of aspirin: Secondary | ICD-10-CM

## 2020-12-08 DIAGNOSIS — R0600 Dyspnea, unspecified: Secondary | ICD-10-CM | POA: Diagnosis not present

## 2020-12-08 DIAGNOSIS — A4101 Sepsis due to Methicillin susceptible Staphylococcus aureus: Principal | ICD-10-CM | POA: Diagnosis present

## 2020-12-08 DIAGNOSIS — Z66 Do not resuscitate: Secondary | ICD-10-CM | POA: Diagnosis not present

## 2020-12-08 DIAGNOSIS — E1142 Type 2 diabetes mellitus with diabetic polyneuropathy: Secondary | ICD-10-CM | POA: Diagnosis not present

## 2020-12-08 DIAGNOSIS — Z794 Long term (current) use of insulin: Secondary | ICD-10-CM

## 2020-12-08 DIAGNOSIS — E1151 Type 2 diabetes mellitus with diabetic peripheral angiopathy without gangrene: Secondary | ICD-10-CM | POA: Diagnosis not present

## 2020-12-08 DIAGNOSIS — J9811 Atelectasis: Secondary | ICD-10-CM | POA: Diagnosis not present

## 2020-12-08 DIAGNOSIS — Z833 Family history of diabetes mellitus: Secondary | ICD-10-CM

## 2020-12-08 DIAGNOSIS — G9341 Metabolic encephalopathy: Secondary | ICD-10-CM | POA: Diagnosis not present

## 2020-12-08 DIAGNOSIS — Z515 Encounter for palliative care: Secondary | ICD-10-CM | POA: Diagnosis not present

## 2020-12-08 DIAGNOSIS — I499 Cardiac arrhythmia, unspecified: Secondary | ICD-10-CM | POA: Diagnosis not present

## 2020-12-08 DIAGNOSIS — E785 Hyperlipidemia, unspecified: Secondary | ICD-10-CM | POA: Diagnosis not present

## 2020-12-08 DIAGNOSIS — I517 Cardiomegaly: Secondary | ICD-10-CM | POA: Diagnosis not present

## 2020-12-08 DIAGNOSIS — Z8249 Family history of ischemic heart disease and other diseases of the circulatory system: Secondary | ICD-10-CM

## 2020-12-08 DIAGNOSIS — L03115 Cellulitis of right lower limb: Secondary | ICD-10-CM | POA: Diagnosis present

## 2020-12-08 DIAGNOSIS — Z89512 Acquired absence of left leg below knee: Secondary | ICD-10-CM

## 2020-12-08 DIAGNOSIS — R404 Transient alteration of awareness: Secondary | ICD-10-CM | POA: Diagnosis not present

## 2020-12-08 DIAGNOSIS — Z79899 Other long term (current) drug therapy: Secondary | ICD-10-CM

## 2020-12-08 DIAGNOSIS — E869 Volume depletion, unspecified: Secondary | ICD-10-CM | POA: Diagnosis not present

## 2020-12-08 DIAGNOSIS — Z86718 Personal history of other venous thrombosis and embolism: Secondary | ICD-10-CM

## 2020-12-08 DIAGNOSIS — L8990 Pressure ulcer of unspecified site, unspecified stage: Secondary | ICD-10-CM | POA: Insufficient documentation

## 2020-12-08 DIAGNOSIS — R0902 Hypoxemia: Secondary | ICD-10-CM

## 2020-12-08 DIAGNOSIS — I2510 Atherosclerotic heart disease of native coronary artery without angina pectoris: Secondary | ICD-10-CM | POA: Diagnosis present

## 2020-12-08 DIAGNOSIS — Z955 Presence of coronary angioplasty implant and graft: Secondary | ICD-10-CM

## 2020-12-08 DIAGNOSIS — R6889 Other general symptoms and signs: Secondary | ICD-10-CM | POA: Diagnosis not present

## 2020-12-08 DIAGNOSIS — I340 Nonrheumatic mitral (valve) insufficiency: Secondary | ICD-10-CM | POA: Diagnosis not present

## 2020-12-08 DIAGNOSIS — Z801 Family history of malignant neoplasm of trachea, bronchus and lung: Secondary | ICD-10-CM

## 2020-12-08 DIAGNOSIS — B9561 Methicillin susceptible Staphylococcus aureus infection as the cause of diseases classified elsewhere: Secondary | ICD-10-CM | POA: Diagnosis not present

## 2020-12-08 DIAGNOSIS — M6282 Rhabdomyolysis: Secondary | ICD-10-CM | POA: Diagnosis present

## 2020-12-08 DIAGNOSIS — I252 Old myocardial infarction: Secondary | ICD-10-CM

## 2020-12-08 DIAGNOSIS — Z452 Encounter for adjustment and management of vascular access device: Secondary | ICD-10-CM

## 2020-12-08 DIAGNOSIS — Z9911 Dependence on respirator [ventilator] status: Secondary | ICD-10-CM

## 2020-12-08 DIAGNOSIS — R918 Other nonspecific abnormal finding of lung field: Secondary | ICD-10-CM | POA: Diagnosis not present

## 2020-12-08 DIAGNOSIS — G9340 Encephalopathy, unspecified: Secondary | ICD-10-CM | POA: Diagnosis not present

## 2020-12-08 DIAGNOSIS — R0603 Acute respiratory distress: Secondary | ICD-10-CM

## 2020-12-08 DIAGNOSIS — E11610 Type 2 diabetes mellitus with diabetic neuropathic arthropathy: Secondary | ICD-10-CM | POA: Diagnosis not present

## 2020-12-08 DIAGNOSIS — Z9119 Patient's noncompliance with other medical treatment and regimen: Secondary | ICD-10-CM

## 2020-12-08 DIAGNOSIS — Z823 Family history of stroke: Secondary | ICD-10-CM

## 2020-12-08 DIAGNOSIS — I959 Hypotension, unspecified: Secondary | ICD-10-CM | POA: Diagnosis not present

## 2020-12-08 DIAGNOSIS — I82221 Chronic embolism and thrombosis of inferior vena cava: Secondary | ICD-10-CM | POA: Diagnosis not present

## 2020-12-08 DIAGNOSIS — Z4682 Encounter for fitting and adjustment of non-vascular catheter: Secondary | ICD-10-CM | POA: Diagnosis not present

## 2020-12-08 DIAGNOSIS — Z811 Family history of alcohol abuse and dependence: Secondary | ICD-10-CM

## 2020-12-08 DIAGNOSIS — Z7902 Long term (current) use of antithrombotics/antiplatelets: Secondary | ICD-10-CM

## 2020-12-08 DIAGNOSIS — K8689 Other specified diseases of pancreas: Secondary | ICD-10-CM | POA: Diagnosis not present

## 2020-12-08 DIAGNOSIS — G4733 Obstructive sleep apnea (adult) (pediatric): Secondary | ICD-10-CM | POA: Diagnosis present

## 2020-12-08 DIAGNOSIS — Z86711 Personal history of pulmonary embolism: Secondary | ICD-10-CM

## 2020-12-08 LAB — CBC WITH DIFFERENTIAL/PLATELET
Abs Immature Granulocytes: 0.03 10*3/uL (ref 0.00–0.07)
Basophils Absolute: 0 10*3/uL (ref 0.0–0.1)
Basophils Relative: 0 %
Eosinophils Absolute: 0 10*3/uL (ref 0.0–0.5)
Eosinophils Relative: 0 %
HCT: 45.6 % (ref 39.0–52.0)
Hemoglobin: 15.4 g/dL (ref 13.0–17.0)
Immature Granulocytes: 0 %
Lymphocytes Relative: 4 %
Lymphs Abs: 0.4 10*3/uL — ABNORMAL LOW (ref 0.7–4.0)
MCH: 30.6 pg (ref 26.0–34.0)
MCHC: 33.8 g/dL (ref 30.0–36.0)
MCV: 90.7 fL (ref 80.0–100.0)
Monocytes Absolute: 0.9 10*3/uL (ref 0.1–1.0)
Monocytes Relative: 9 %
Neutro Abs: 8.8 10*3/uL — ABNORMAL HIGH (ref 1.7–7.7)
Neutrophils Relative %: 87 %
Platelets: 246 10*3/uL (ref 150–400)
RBC: 5.03 MIL/uL (ref 4.22–5.81)
RDW: 13.7 % (ref 11.5–15.5)
WBC: 10.2 10*3/uL (ref 4.0–10.5)
nRBC: 0 % (ref 0.0–0.2)

## 2020-12-08 LAB — I-STAT CHEM 8, ED
BUN: >130 mg/dL — ABNORMAL HIGH (ref 8–23)
Calcium, Ion: 0.96 mmol/L — ABNORMAL LOW (ref 1.15–1.40)
Chloride: 85 mmol/L — ABNORMAL LOW (ref 98–111)
Creatinine, Ser: 3 mg/dL — ABNORMAL HIGH (ref 0.61–1.24)
Glucose, Bld: >700 mg/dL (ref 70–99)
HCT: 49 % (ref 39.0–52.0)
Hemoglobin: 16.7 g/dL (ref 13.0–17.0)
Potassium: 4.5 mmol/L (ref 3.5–5.1)
Sodium: 118 mmol/L — CL (ref 135–145)
TCO2: 10 mmol/L — ABNORMAL LOW (ref 22–32)

## 2020-12-08 LAB — COMPREHENSIVE METABOLIC PANEL
ALT: 18 U/L (ref 0–44)
AST: 29 U/L (ref 15–41)
Albumin: 2.6 g/dL — ABNORMAL LOW (ref 3.5–5.0)
Alkaline Phosphatase: 72 U/L (ref 38–126)
BUN: 133 mg/dL — ABNORMAL HIGH (ref 8–23)
CO2: <7 mmol/L — ABNORMAL LOW (ref 22–32)
Calcium: 7.7 mg/dL — ABNORMAL LOW (ref 8.9–10.3)
Chloride: 80 mmol/L — ABNORMAL LOW (ref 98–111)
Creatinine, Ser: 4.01 mg/dL — ABNORMAL HIGH (ref 0.61–1.24)
GFR, Estimated: 16 mL/min — ABNORMAL LOW (ref >60)
Glucose, Bld: 777 mg/dL (ref 70–99)
Potassium: 4.6 mmol/L (ref 3.5–5.1)
Sodium: 121 mmol/L — ABNORMAL LOW (ref 135–145)
Total Bilirubin: 3.9 mg/dL — ABNORMAL HIGH (ref 0.3–1.2)
Total Protein: 6 g/dL — ABNORMAL LOW (ref 6.5–8.1)

## 2020-12-08 LAB — PHOSPHORUS: Phosphorus: 2.5 mg/dL (ref 2.5–4.6)

## 2020-12-08 LAB — I-STAT VENOUS BLOOD GAS, ED
Acid-base deficit: 19 mmol/L — ABNORMAL HIGH (ref 0.0–2.0)
Bicarbonate: 8 mmol/L — ABNORMAL LOW (ref 20.0–28.0)
Calcium, Ion: 0.96 mmol/L — ABNORMAL LOW (ref 1.15–1.40)
HCT: 47 % (ref 39.0–52.0)
Hemoglobin: 16 g/dL (ref 13.0–17.0)
O2 Saturation: 81 %
Potassium: 4.5 mmol/L (ref 3.5–5.1)
Sodium: 118 mmol/L — CL (ref 135–145)
TCO2: 9 mmol/L — ABNORMAL LOW (ref 22–32)
pCO2, Ven: 23.3 mmHg — ABNORMAL LOW (ref 44.0–60.0)
pH, Ven: 7.141 — CL (ref 7.250–7.430)
pO2, Ven: 57 mmHg — ABNORMAL HIGH (ref 32.0–45.0)

## 2020-12-08 LAB — MAGNESIUM: Magnesium: 2.3 mg/dL (ref 1.7–2.4)

## 2020-12-08 LAB — HEPARIN LEVEL (UNFRACTIONATED): Heparin Unfractionated: <0.1 IU/mL — ABNORMAL LOW (ref 0.30–0.70)

## 2020-12-08 LAB — MRSA NEXT GEN BY PCR, NASAL: MRSA by PCR Next Gen: NOT DETECTED

## 2020-12-08 LAB — PROTIME-INR
INR: 1.1 (ref 0.8–1.2)
Prothrombin Time: 14.5 seconds (ref 11.4–15.2)

## 2020-12-08 LAB — RESP PANEL BY RT-PCR (FLU A&B, COVID) ARPGX2
Influenza A by PCR: NEGATIVE
Influenza B by PCR: NEGATIVE
SARS Coronavirus 2 by RT PCR: NEGATIVE

## 2020-12-08 LAB — LIPID PANEL
Cholesterol: 78 mg/dL (ref 0–200)
HDL: 19 mg/dL — ABNORMAL LOW (ref >40)
LDL Cholesterol: 46 mg/dL (ref 0–99)
Total CHOL/HDL Ratio: 4.1 RATIO
Triglycerides: 66 mg/dL (ref <150)
VLDL: 13 mg/dL (ref 0–40)

## 2020-12-08 LAB — LACTIC ACID, PLASMA
Lactic Acid, Venous: 2.3 mmol/L (ref 0.5–1.9)
Lactic Acid, Venous: 0.8 mmol/L (ref 0.5–1.9)

## 2020-12-08 LAB — HEMOGLOBIN A1C
Hgb A1c MFr Bld: 7 % — ABNORMAL HIGH (ref 4.8–5.6)
Mean Plasma Glucose: 154.2 mg/dL

## 2020-12-08 LAB — GLUCOSE, CAPILLARY
Glucose-Capillary: 581 mg/dL (ref 70–99)
Glucose-Capillary: 567 mg/dL (ref 70–99)
Glucose-Capillary: 588 mg/dL (ref 70–99)
Glucose-Capillary: 557 mg/dL (ref 70–99)
Glucose-Capillary: 541 mg/dL (ref 70–99)

## 2020-12-08 LAB — HIV ANTIBODY (ROUTINE TESTING W REFLEX): HIV Screen 4th Generation wRfx: NONREACTIVE

## 2020-12-08 LAB — BASIC METABOLIC PANEL
Anion gap: 5 (ref 5–15)
BUN: 46 mg/dL — ABNORMAL HIGH (ref 8–23)
CO2: 24 mmol/L (ref 22–32)
Calcium: 8.4 mg/dL — ABNORMAL LOW (ref 8.9–10.3)
Chloride: 117 mmol/L — ABNORMAL HIGH (ref 98–111)
Creatinine, Ser: 0.45 mg/dL — ABNORMAL LOW (ref 0.61–1.24)
GFR, Estimated: >60 mL/min (ref >60)
Glucose, Bld: 134 mg/dL — ABNORMAL HIGH (ref 70–99)
Potassium: 3.7 mmol/L (ref 3.5–5.1)
Sodium: 146 mmol/L — ABNORMAL HIGH (ref 135–145)

## 2020-12-08 LAB — BRAIN NATRIURETIC PEPTIDE: B Natriuretic Peptide: 564.4 pg/mL — ABNORMAL HIGH (ref 0.0–100.0)

## 2020-12-08 LAB — TSH: TSH: 6.441 u[IU]/mL — ABNORMAL HIGH (ref 0.350–4.500)

## 2020-12-08 LAB — BETA-HYDROXYBUTYRIC ACID: Beta-Hydroxybutyric Acid: 0.06 mmol/L (ref 0.05–0.27)

## 2020-12-08 LAB — LIPASE, BLOOD: Lipase: 689 U/L — ABNORMAL HIGH (ref 11–51)

## 2020-12-08 LAB — APTT: aPTT: 28 seconds (ref 24–36)

## 2020-12-08 MED ORDER — INSULIN REGULAR(HUMAN) IN NACL 100-0.9 UT/100ML-% IV SOLN
INTRAVENOUS | Status: DC
Start: 2020-12-08 — End: 2020-12-10
  Administered 2020-12-08: 11.5 [IU]/h via INTRAVENOUS
  Administered 2020-12-09: 25 [IU]/h via INTRAVENOUS
  Administered 2020-12-09: 21 [IU]/h via INTRAVENOUS
  Administered 2020-12-10: 2.4 [IU]/h via INTRAVENOUS
  Filled 2020-12-08 (×6): qty 100

## 2020-12-08 MED ORDER — CHLORHEXIDINE GLUCONATE CLOTH 2 % EX PADS
6.0000 | MEDICATED_PAD | Freq: Every day | CUTANEOUS | Status: DC
  Administered 2020-12-08 – 2020-12-16 (×8): 6 via TOPICAL

## 2020-12-08 MED ORDER — LACTATED RINGERS IV SOLN: INTRAVENOUS | Status: DC

## 2020-12-08 MED ORDER — SODIUM CHLORIDE 0.9 % IV BOLUS
2000.0000 mL | Freq: Once | INTRAVENOUS | Status: AC
  Administered 2020-12-08: 2000 mL via INTRAVENOUS

## 2020-12-08 MED ORDER — AMIODARONE HCL IN DEXTROSE 360-4.14 MG/200ML-% IV SOLN
30.0000 mg/h | INTRAVENOUS | Status: DC
  Administered 2020-12-09 – 2020-12-10 (×3): 30 mg/h via INTRAVENOUS
  Filled 2020-12-08 (×3): qty 200

## 2020-12-08 MED ORDER — DOCUSATE SODIUM 100 MG PO CAPS: 100.0000 mg | ORAL_CAPSULE | Freq: Two times a day (BID) | ORAL | Status: DC | PRN

## 2020-12-08 MED ORDER — HEPARIN (PORCINE) 25000 UT/250ML-% IV SOLN
1500.0000 [IU]/h | INTRAVENOUS | Status: DC
  Administered 2020-12-08 – 2020-12-10 (×3): 1550 [IU]/h via INTRAVENOUS
  Administered 2020-12-11 (×2): 1500 [IU]/h via INTRAVENOUS
  Filled 2020-12-08 (×5): qty 250

## 2020-12-08 MED ORDER — SODIUM CHLORIDE 0.9 % IV SOLN: 2.0000 g | INTRAVENOUS | Status: DC

## 2020-12-08 MED ORDER — NOREPINEPHRINE 4 MG/250ML-% IV SOLN
INTRAVENOUS | Status: AC
  Filled 2020-12-08: qty 250

## 2020-12-08 MED ORDER — AMIODARONE HCL IN DEXTROSE 360-4.14 MG/200ML-% IV SOLN
60.0000 mg/h | INTRAVENOUS | Status: AC
  Administered 2020-12-08 – 2020-12-09 (×2): 60 mg/h via INTRAVENOUS
  Filled 2020-12-08: qty 200

## 2020-12-08 MED ORDER — LACTATED RINGERS IV BOLUS
1000.0000 mL | Freq: Once | INTRAVENOUS | Status: AC
  Administered 2020-12-08: 1000 mL via INTRAVENOUS

## 2020-12-08 MED ORDER — POTASSIUM CHLORIDE 10 MEQ/100ML IV SOLN
10.0000 meq | INTRAVENOUS | Status: AC
  Filled 2020-12-08: qty 100

## 2020-12-08 MED ORDER — SODIUM CHLORIDE 0.9 % IV SOLN
2.0000 g | Freq: Once | INTRAVENOUS | Status: AC
  Administered 2020-12-08: 2 g via INTRAVENOUS
  Filled 2020-12-08: qty 2

## 2020-12-08 MED ORDER — NOREPINEPHRINE 4 MG/250ML-% IV SOLN
0.0000 ug/min | INTRAVENOUS | Status: DC
  Administered 2020-12-08: 2 ug/min via INTRAVENOUS
  Administered 2020-12-09: 3 ug/min via INTRAVENOUS
  Filled 2020-12-08: qty 250

## 2020-12-08 MED ORDER — POTASSIUM CHLORIDE 2 MEQ/ML IV SOLN
INTRAVENOUS | Status: DC
  Filled 2020-12-08 (×3): qty 1000

## 2020-12-08 MED ORDER — VANCOMYCIN HCL 2000 MG/400ML IV SOLN
2000.0000 mg | Freq: Once | INTRAVENOUS | Status: AC
  Administered 2020-12-08: 2000 mg via INTRAVENOUS
  Filled 2020-12-08: qty 400

## 2020-12-08 MED ORDER — PANTOPRAZOLE SODIUM 40 MG IV SOLR
40.0000 mg | INTRAVENOUS | Status: DC
  Administered 2020-12-09 – 2020-12-13 (×5): 40 mg via INTRAVENOUS
  Filled 2020-12-08 (×5): qty 40

## 2020-12-08 MED ORDER — CLOTRIMAZOLE 1 % EX CREA
TOPICAL_CREAM | Freq: Two times a day (BID) | CUTANEOUS | Status: DC
  Administered 2020-12-09 – 2020-12-12 (×3): 1 via TOPICAL
  Filled 2020-12-08 (×3): qty 15

## 2020-12-08 MED ORDER — HEPARIN BOLUS VIA INFUSION
4000.0000 [IU] | Freq: Once | INTRAVENOUS | Status: AC
  Administered 2020-12-08: 4000 [IU] via INTRAVENOUS
  Filled 2020-12-08: qty 4000

## 2020-12-08 MED ORDER — VANCOMYCIN VARIABLE DOSE PER UNSTABLE RENAL FUNCTION (PHARMACIST DOSING): Status: DC

## 2020-12-08 MED ORDER — LACTATED RINGERS IV BOLUS (SEPSIS)
1500.0000 mL | Freq: Once | INTRAVENOUS | Status: AC
  Administered 2020-12-08: 1500 mL via INTRAVENOUS

## 2020-12-08 MED ORDER — METRONIDAZOLE 500 MG/100ML IV SOLN
500.0000 mg | Freq: Once | INTRAVENOUS | Status: AC
  Administered 2020-12-08: 500 mg via INTRAVENOUS
  Filled 2020-12-08: qty 100

## 2020-12-08 MED ORDER — KCL-LACTATED RINGERS 20 MEQ/L IV SOLN
INTRAVENOUS | Status: DC
  Filled 2020-12-08: qty 1000

## 2020-12-08 MED ORDER — DEXTROSE 50 % IV SOLN: 0.0000 mL | INTRAVENOUS | Status: DC | PRN

## 2020-12-08 MED ORDER — AMIODARONE LOAD VIA INFUSION
150.0000 mg | Freq: Once | INTRAVENOUS | Status: AC
  Administered 2020-12-08: 150 mg via INTRAVENOUS
  Filled 2020-12-08: qty 83.34

## 2020-12-08 MED ORDER — HEPARIN SODIUM (PORCINE) 5000 UNIT/ML IJ SOLN: 5000.0000 [IU] | Freq: Three times a day (TID) | INTRAMUSCULAR | Status: DC

## 2020-12-08 MED ORDER — DEXTROSE IN LACTATED RINGERS 5 % IV SOLN: INTRAVENOUS | Status: DC

## 2020-12-08 MED ORDER — POLYETHYLENE GLYCOL 3350 17 G PO PACK: 17.0000 g | PACK | Freq: Every day | ORAL | Status: DC | PRN

## 2020-12-08 NOTE — H&P (Signed)
NAME:  Samuel BusingJohn D [name], MRN:  [id number], DOB:  1951-05-08, LOS: 0 ADMISSION DATE:  08/17/20, CONSULTATION DATE:  08/17/20 CHIEF COMPLAINT:  DKA   History of Present Illness:  Patient presents with N/V, obtunded. Unable to provide history or get ahold of family. History obtained from chart review and per report.  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 He had 3 days of increased vomiting, per family.  Pertinent  Medical History  IDT2DM, CAD, HTN, h/o MRSA sepsis, h/o osteomyelitis, h/o PE, s/p left BKA  Significant Hospital Events: Including procedures, antibiotic start and stop dates in addition to other pertinent events   Cefepime (6/23-) Flagyl (6/23-) Vancomycin (6/23-)  Interim History / Subjective:  Patient initially only moaning in response to questions but in no acute distress. With fluid resuscitation, began speaking in coherent phrases but unable to respond appropriately to conversation. Indicates he is in pain.  Objective   Blood pressure (!) 82/48, pulse (!) 134, temperature (!) 91.5 F (33.1 C), temperature source Rectal, resp. rate (!) 26, SpO2 90 %.       No intake or output data in the 24 hours ending 12/19/2020 1832 There were no vitals filed for this visit.  Examination: General: NAD, laying in bed with eyes open and responds with moaning to verbal stimuli HENT: equal round pupils, constricted and non-reactive. Dry MM Lungs: normal lung sounds with reduced volumes Cardiovascular: tachycardic, regular rhythm, no murmur appreciated. Cap refill >2sec, skin is dry and cold Abdomen: protuberant but soft. Endorses tenderness to palpation of RUQ. Decreased bowel sounds. Liver edge at costal margin Extremities: muscle wasting of lower extremities. Right BKA. R leg chronic vascular changes Neuro: alert but non-verbal. Able to intermittently follow simple command. GU: neg suprapubic tenderness Derm: significant erythematous rash in groin area extending to scrotum with  skin erosion.   Labs/imaging that I have personally reviewed  (right click and "Reselect all SmartList Selections" daily)  Glucose on admission 777, Cr 4.01, LA 2.3, VBG- pH 7.1, pCO@ 23.3, bicarb 8, Na 118, acid-base deficit 19 Cxray- neg pulm pathology, enlarged heart vs angle of image  Resolved Hospital Problem list     Assessment & Plan:  70yo diabetic presents with DKA, septic shock  CV- received 3L IV fluids and maintenance at 1650mL/hr with Kcl, subclavian line placed. BP and HR improved  ID- LA 2.3, hypothermic to 91.1 CT abd/pelvis pending.  Clotrimazole cream for groin Cefepime, vanc, flagyl (6/23-)  Neuro- encephalopathic. Mental alertness improved with IV fluids and stimulation.  Rewarm with bed hugger.  Frequent neuro checks Head CT pending  Endo- glucose 777 on admission continue IV fluids with K+ replacement. Insulin ggt   Best Practice (right click and "Reselect all SmartList Selections" daily)   Diet/type: NPO Pain/Anxiety/Delirium protocol Yes VAP protocol (if indicated): Not indicated DVT prophylaxis: prophylactic heparin  GI prophylaxis: PPI Glucose control:  insulin gtt.  Central venous access:  Yes, and it is still needed Arterial line:  N/A Foley:  Yes, and it is still needed Mobility:  OOB  PT consulted: Yes Studies pending: CT head, abd/pelv Culture data pending:blood Last reviewed culture data:today Antibiotics:cefepime, vanc, and flagyl  Antibiotic de-escalation: no,  continue current rx Stop date: to be determined  Code Status:  full code Last date of multidisciplinary goals of care discussion [unable to reach family] ccm prognosis: Serious Disposition: remains critically ill, will stay in intensive care   Labs   CBC: Recent Labs  Lab 12/19/2020 1403 12/19/2020 1444  WBC 10.2  --   NEUTROABS 8.8*  --   HGB 15.4 16.0  16.7  HCT 45.6 47.0  49.0  MCV 90.7  --   PLT 246  --     Basic Metabolic Panel: Recent Labs  Lab  12/11/2020 1403 12/01/2020 1444  NA 121* 118*  118*  K 4.6 4.5  4.5  CL 80* 85*  CO2 <7*  --   GLUCOSE 777* >700*  BUN 133* >130*  CREATININE 4.01* 3.00*  CALCIUM 7.7*  --    GFR: CrCl cannot be calculated (Unknown ideal weight.). Recent Labs  Lab 12/12/2020 1403  WBC 10.2  LATICACIDVEN 2.3*    Liver Function Tests: Recent Labs  Lab 11/16/2020 1403  AST 29  ALT 18  ALKPHOS 72  BILITOT 3.9*  PROT 6.0*  ALBUMIN 2.6*   Recent Labs  Lab 11/22/2020 1403  LIPASE 689*   No results for input(s): AMMONIA in the last 168 hours.  ABG    Component Value Date/Time   PHART 7.377 03/13/2011 1636   PCO2ART 38.8 03/13/2011 1636   PO2ART 60.0 (L) 03/13/2011 1636   HCO3 8.0 (L) 12/05/2020 1444   TCO2 10 (L) 11/29/2020 1444   TCO2 9 (L) 11/23/2020 1444   ACIDBASEDEF 19.0 (H) 12/03/2020 1444   O2SAT 81.0 12/12/2020 1444     Coagulation Profile: Recent Labs  Lab 12/14/2020 1403  INR 1.1    Cardiac Enzymes: No results for input(s): CKTOTAL, CKMB, CKMBINDEX, TROPONINI in the last 168 hours.  HbA1C: Hemoglobin A1C  Date/Time Value Ref Range Status  06/22/2020 02:47 PM 12.0 (A) 4.0 - 5.6 % Final   Hgb A1c MFr Bld  Date/Time Value Ref Range Status  04/11/2020 11:53 AM 13.1 (H) 4.6 - 6.5 % Final    Comment:    Glycemic Control Guidelines for People with Diabetes:Non Diabetic:  <6%Goal of Therapy: <7%Additional Action Suggested:  >8%   01/16/2020 03:31 PM 8.5 (H) 4.8 - 5.6 % Final    Comment:    (NOTE) Pre diabetes:          5.7%-6.4%  Diabetes:              >6.4%  Glycemic control for   <7.0% adults with diabetes     CBG: No results for input(s): GLUCAP in the last 168 hours.  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   Surgical History:   Past Surgical History:  Procedure Laterality Date   AMPUTATION Left 03/08/2015   Procedure:  LEFT AMPUTATION BELOW KNEE;  Surgeon: Toni Arthurs, MD;  Location: [facility];  Service: Orthopedics;  Laterality: Left;   APPLICATION OF WOUND VAC Left 01/07/2014   CHOLECYSTECTOMY  06/2010   CORONARY ANGIOPLASTY WITH STENT PLACEMENT  05/2009   "1"   FOOT SURGERY Left 2010   "for Charcot's joint"   HARDWARE REMOVAL Left 01/07/2014   Procedure: LEFT LEG REMOVAL OF DEEP IMPLANT AND SEQUESTRECTOMY; APPLICATION OF WOUND VAC ;  Surgeon: Toni Arthurs, MD;  Location: [facility] OR;  Service: Orthopedics;  Laterality: Left;   I & D EXTREMITY Left "multiple"   leg   I & D EXTREMITY Left 01/07/2014   Procedure: 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;  Surgeon: Toni Arthurs, MD;  Location: [facility] OR;  Service: Orthopedics;  Laterality: Left;   IM NAILING TIBIA Left ~ 2012   LEFT HEART CATH AND CORONARY ANGIOGRAPHY N/A 04/08/2017   Procedure: LEFT HEART CATH AND CORONARY ANGIOGRAPHY;  Surgeon: Runell Gess, MD;  Location: [facility] INVASIVE CV [facility];  Service: Cardiovascular;  Laterality: N/A;   SHOULDER ARTHROSCOPY W/ ROTATOR CUFF REPAIR Left    "and bone spurs"   TIBIAL IM ROD REMOVAL Left 01/07/2014   TOE AMPUTATION Right ~ 2011   "great toe"   VENA CAVA FILTER PLACEMENT  2012   VENA CAVA FILTER PLACEMENT N/A 11/20/2016   Procedure: INSERTION VENA-CAVA FILTER;  Surgeon: Sherren Kerns, MD;  Location[facility];  Service: Vascular;  Laterality: N/A;   WOUND DEBRIDEMENT Left 01/07/2014   "tibia"     Social History:   reports that he has never  smoked. He has never used smokeless tobacco. He reports that he does not drink alcohol and does not use drugs.   Family History:  His family history includes Alcoholism in his brother; COPD in his brother and mother; Diabetes in his brother; Heart disease in his brother and mother; Hypertension in his brother; Lung cancer in his mother; Retinal detachment in his father; Stroke in his brother.   Allergies Allergies  Allergen Reactions   Gabapentin Hives, Itching, Nausea And Vomiting and Rash    REACTION: rash/hives (per patient, was a reaction to an inactive ingredient in another mgf brand). The patient stated that he does take this medication now and it doesn't cause a rash any more. LOW DOSE   Relafen [Nabumetone] Nausea And Vomiting and Rash   Codeine Nausea And Vomiting and Rash     Home Medications  Prior to Admission medications   Medication Sig Start Date End Date Taking? Authorizing Provider  acarbose (PRECOSE) 50 MG tablet Take 50 mg by mouth 3 (three) times daily with meals.    [provider]  alum & mag hydroxide-simeth (MAALOX/MYLANTA) 200-200-20 MG/5ML suspension Take 15 mLs by mouth every 6 (six) hours as needed for indigestion or heartburn. 09/25/19   Marguerita Merles Latif, DO  amLODipine (NORVASC) 10 MG tablet Take 1 tablet (10 mg total) by mouth daily. 11/22/16   Richarda Overlie, MD  apixaban (ELIQUIS) 5 MG TABS tablet Take 5 mg by mouth 2 (two) times daily.    [provider]  aspirin 81 MG EC tablet Take 1 tablet (81 mg total) by mouth daily. 04/12/17   Glade Lloyd, MD  atorvastatin (LIPITOR) 80 MG tablet Take 1 tablet (80 mg total) by mouth daily at 6 PM. Patient taking differently: Take 80 mg by mouth daily. 04/11/17   Glade Lloyd, MD  clopidogrel (PLAVIX) 75 MG tablet Take 75 mg by mouth daily. 12/18/19   [provider]  diclofenac Sodium (VOLTAREN) 1 % GEL Rub a small grape sized dollop into the sore joints of hands up to 3 times daily as needed  for pain. 11/21/20   Mliss Sax, MD  docusate sodium (COLACE) 100 MG capsule Take 100 mg by mouth daily.    [provider]  furosemide (LASIX) 20 MG tablet Take 1 tablet (20 mg total) by mouth daily. 04/12/17   Glade Lloyd, MD  gabapentin (NEURONTIN) 300 MG capsule TAKE  1 CAPSULE BY MOUTH 3 TIMES A DAY 09/23/20   Mliss Sax, MD  insulin aspart (NOVOLOG FLEXPEN) 100 UNIT/ML FlexPen Inject 20 Units into the skin 3 (three) times daily with meals. 06/22/20   Shamleffer, Konrad Dolores, MD  insulin degludec (TRESIBA FLEXTOUCH) 200 UNIT/ML FlexTouch Pen Inject 100 Units into the skin daily. 06/22/20   Shamleffer, Konrad Dolores, MD  metoprolol tartrate (LOPRESSOR) 25 MG tablet Take 1 tablet (25 mg total) by mouth 2 (two) times daily. 04/11/17   Glade Lloyd, MD  nitroGLYCERIN (NITROSTAT) 0.4 MG SL tablet Place 1 tablet (0.4 mg total) under the tongue every 5 (five) minutes as needed for chest pain. 04/11/17   Glade Lloyd, MD  [facility] VERIO test strip Use to test blood sugar twice daily 09/24/17   [provider]  pantoprazole (PROTONIX) 40 MG tablet Take 1 tablet (40 mg total) by mouth 2 (two) times daily before a meal. 08/21/18   Mikhail, Nita Sells, DO  promethazine (PHENERGAN) 25 MG tablet TAKE 1 TO 2 TABLETS BY MOUTH EVERY 4 TO 6 HOURS AS NEEDED FOR NAUSEA 11/21/20   Mliss Sax, MD  promethazine (PHENERGAN) 25 MG/ML injection Inject 0.5 mLs (12.5 mg total) into the muscle every 6 (six) hours as needed for nausea or vomiting. Patient not taking: Reported on 11/22/2020 09/20/20   Mliss Sax, MD  ranolazine (RANEXA) 500 MG 12 hr tablet Take 1 tablet (500 mg total) by mouth 2 (two) times daily. Patient not taking: No sig reported 04/11/17   Glade Lloyd, MD  traMADol (ULTRAM) 50 MG tablet Take 2 tablets (100 mg total) by mouth every 6 (six) hours as needed for moderate pain. 11/21/20   Mliss Sax, MD     Critical care time:       Jamelle Rushing, DO PGY-3 FM resident

## 2020-12-08 NOTE — Sepsis Progress Note (Signed)
eLink is monitoring this Code Sepsis. °

## 2020-12-08 NOTE — Progress Notes (Signed)
eLink Physician-Brief Progress Note Patient Name: Samuel [name] DOB: 09-02-1950 MRN: [id number]   Date of Service  12/13/2020  HPI/Events of Note  AFIB with RVR - Ventricular rate = 130. Patient is on a Norepinephrine IV infusion at 4 mcg/min.  eICU Interventions  Plan: BMP and Mg++ STAT. Amiodarone IV load and infusion. Heparin IV infusion per pharmacy consult. D/C Heparin .     Intervention Category Major Interventions: Arrhythmia - evaluation and management  Lenell Antu 12/10/2020, 9:39 PM

## 2020-12-08 NOTE — Sepsis Progress Note (Signed)
Notified bedside nurse of need to draw repeat lactic acid. 

## 2020-12-08 NOTE — Progress Notes (Signed)
PCCM Interval Progress Note:  PCCM Ground Team asked by E-Link to evaluate Mr. Rumple at bedside. Patient admitted to ICU hypotensive, obtunded, and hypoxic. Per RN, patient more lethargic and satting 85%.  On arrival to bedside, patient does appear lethargic, but opens eyes to voice and able to follow commands (opens eyes, sticks out tongue, squeezed hand). Mumbling incoherently under NRB mask. Sats 100% on NRB, remains on NE , Telemetry demonstrating A-fib with variable rate (100s to 140s). CXR confirmed subclavian CVC placement and did not demonstrate pulmonary edema.  Per RN report, concern for significant hypovolemia in ED in the setting of vomiting. Advised to give additional bolus/fluid resuscitation and reevaluate.  PCCM will remain available as needed.  Tim Lair, PA-C [facility] 11/23/2020 11:14 PM  Please see [email or web address] for pager details.  From 7A-7P if no response, please call [phone] After hours, please call ELink [phone]

## 2020-12-08 NOTE — Progress Notes (Signed)
Pharmacy Antibiotic Note  Samuel [name] is a 70 y.o. male admitted on Dec 19, 2020 with sepsis.  Pharmacy has been consulted for Cefepime and vancomycin dosing.  SCr elevated at 3, BUN > 130, WBC wnl  Plan: -Cefepime 2 gm IV Q 24 hours -Vancomycin 2000 mg IV load followed vancomycin dosing per random levels -Monitor CBC, renal fx, cultures and clinical progress     Temp (24hrs), Avg:91.5 F (33.1 C), Min:91.5 F (33.1 C), Max:91.5 F (33.1 C)  Recent Labs  Lab 12-19-2020 1403 19-Dec-2020 1444  WBC 10.2  --   CREATININE  --  3.00*    CrCl cannot be calculated (Unknown ideal weight.).    Allergies  Allergen Reactions   Gabapentin Hives, Itching, Nausea And Vomiting and Rash    REACTION: rash/hives (per patient, was a reaction to an inactive ingredient in another mgf brand). The patient stated that he does take this medication now and it doesn't cause a rash any more. LOW DOSE   Relafen [Nabumetone] Nausea And Vomiting and Rash   Codeine Nausea And Vomiting and Rash    Antimicrobials this admission: Cefepime 6/23 >>  Vancomycin 6/23 >>   Dose adjustments this admission:  Microbiology results: 6/23 BCx:  6/23 UCx:    Thank you for allowing pharmacy to be a part of this patient's care.  Vinnie Level, PharmD., BCPS, BCCCP Clinical Pharmacist Please refer to [facility] for unit-specific pharmacist

## 2020-12-08 NOTE — ED Triage Notes (Signed)
Pt BIB [facility] from home after family reports Vomiting with AMS x3 days. Pt oriented to self and situation. Pt A-fib on monitor with no hx reported. ETCO2 10 en route, CBG reads >600,  BP 98/60, RR 30, HR 130

## 2020-12-08 NOTE — Progress Notes (Signed)
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 Unable to provide history or get ahold of family. He had 3 days of increased vomiting, per family. Arrives in ICU with hypotension, obtundation, Sat = 85% and hypothermia. I do not see a PCCM consult note in Epic.    eICU Interventions  Plan: Norepinephrine IV infusion. Titrate to MAP >= 65. Bair Hugger PRN. Will ask the PCCM ground team to evaluate the patient at bedside.      Intervention Category Evaluation Type: New Patient Evaluation  Lenell Antu 2020/12/11, 8:13 PM

## 2020-12-08 NOTE — Progress Notes (Signed)
ANTICOAGULATION CONSULT NOTE - Initial Consult  Pharmacy Consult for heparin Indication: atrial fibrillation  Allergies  Allergen Reactions   Gabapentin Hives, Itching, Nausea And Vomiting and Rash    REACTION: rash/hives (per patient, was a reaction to an inactive ingredient in another mgf brand). The patient stated that he does take this medication now and it doesn't cause a rash any more. LOW DOSE   Relafen [Nabumetone] Nausea And Vomiting and Rash   Codeine Nausea And Vomiting and Rash    Patient Measurements: Height: 6\' 3"  (190.5 cm) Weight: 106.6 kg (235 lb) IBW/kg (Calculated) : 84.5 Heparin Dosing Weight: 105kg  Vital Signs: Temp: 91.4 F (33 C) (06/23 1955) Temp Source: Axillary (06/23 1955) BP: 103/60 (06/23 2100) Pulse Rate: 121 (06/23 2100)  Labs: Recent Labs    27-Dec-2020 1403 12/27/20 1444  HGB 15.4 16.0  16.7  HCT 45.6 47.0  49.0  PLT 246  --   APTT 28  --   LABPROT 14.5  --   INR 1.1  --   CREATININE 4.01* 3.00*    Estimated Creatinine Clearance: 31.5 mL/min (A) (by C-G formula based on SCr of 3 mg/dL (H)).   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 of 2012   Shortness of breath    exertion   Type II diabetes mellitus (HCC)     Medications:  Medications Prior to Admission  Medication Sig Dispense Refill Last Dose   acarbose (PRECOSE) 50 MG tablet Take 50 mg by mouth 3  (three) times daily with meals.   Past Week   alum & mag hydroxide-simeth (MAALOX/MYLANTA) 200-200-20 MG/5ML suspension Take 15 mLs by mouth every 6 (six) hours as needed for indigestion or heartburn. 355 mL 0 unknown   amLODipine (NORVASC) 10 MG tablet Take 1 tablet (10 mg total) by mouth daily. 30 tablet 2 Past Week   apixaban (ELIQUIS) 5 MG TABS tablet Take 5 mg by mouth 2 (two) times daily.   Past Week   aspirin 81 MG EC tablet Take 1 tablet (81 mg total) by mouth daily. 30 tablet 0 Past Week   atorvastatin (LIPITOR) 80 MG tablet Take 1 tablet (80 mg total) by mouth daily at 6 PM. (Patient taking differently: Take 80 mg by mouth daily.) 30 tablet 0 Past Week   clopidogrel (PLAVIX) 75 MG tablet Take 75 mg by mouth daily.   Past Week   diclofenac Sodium (VOLTAREN) 1 % GEL Rub a small grape sized dollop into the sore joints of hands up to 3 times daily as needed for pain. 150 g 1 unknown   docusate sodium (COLACE) 100 MG capsule Take 100 mg by mouth daily.   Past Week   furosemide (LASIX) 20 MG tablet Take 1 tablet (20 mg total) by  mouth daily. 30 tablet 0 Past Week   gabapentin (NEURONTIN) 300 MG capsule TAKE 1 CAPSULE BY MOUTH 3 TIMES A DAY (Patient taking differently: Take 300 mg by mouth 3 (three) times daily.) 90 capsule 6 Past Week   insulin aspart (NOVOLOG FLEXPEN) 100 UNIT/ML FlexPen Inject 20 Units into the skin 3 (three) times daily with meals. 15 mL 11 Past Week   insulin degludec (TRESIBA FLEXTOUCH) 200 UNIT/ML FlexTouch Pen Inject 100 Units into the skin daily. 15 mL 11 Past Week   metoprolol tartrate (LOPRESSOR) 25 MG tablet Take 1 tablet (25 mg total) by mouth 2 (two) times daily. 60 tablet 0 Past Week   nitroGLYCERIN (NITROSTAT) 0.4 MG SL tablet Place 1 tablet (0.4 mg total) under the tongue every 5 (five) minutes as needed for chest pain. 30 tablet 0 unknown   ONETOUCH VERIO test strip Use to test blood sugar twice daily  4    pantoprazole (PROTONIX) 40 MG tablet Take 1 tablet (40  mg total) by mouth 2 (two) times daily before a meal. 60 tablet 0 Past Week   promethazine (PHENERGAN) 25 MG tablet TAKE 1 TO 2 TABLETS BY MOUTH EVERY 4 TO 6 HOURS AS NEEDED FOR NAUSEA (Patient taking differently: Take 25-50 mg by mouth every 6 (six) hours as needed for nausea.) 240 tablet 1 unknown   traMADol (ULTRAM) 50 MG tablet Take 2 tablets (100 mg total) by mouth every 6 (six) hours as needed for moderate pain. 30 tablet 1 unknown   promethazine (PHENERGAN) 25 MG/ML injection Inject 0.5 mLs (12.5 mg total) into the muscle every 6 (six) hours as needed for nausea or vomiting. (Patient not taking: No sig reported) 25 mL 11 Not Taking   ranolazine (RANEXA) 500 MG 12 hr tablet Take 1 tablet (500 mg total) by mouth 2 (two) times daily. (Patient not taking: No sig reported) 60 tablet 0 Not Taking   Scheduled:   amiodarone  150 mg Intravenous Once   Chlorhexidine Gluconate Cloth  6 each Topical Daily   clotrimazole   Topical BID   heparin  4,000 Units Intravenous Once   [START ON 12/09/2020] pantoprazole (PROTONIX) IV  40 mg Intravenous Q24H   vancomycin variable dose per unstable renal function (pharmacist dosing)   Does not apply See admin instructions   Infusions:   amiodarone     Followed by   Melene Muller ON 12/09/2020] amiodarone     [START ON 12/09/2020] ceFEPime (MAXIPIME) IV     dextrose 5% lactated ringers     heparin     insulin 11.5 Units/hr (12-12-2020 2001)   lactated ringers with kcl 150 mL/hr at 12/12/20 2003   norepinephrine     norepinephrine (LEVOPHED) Adult infusion 2 mcg/min (12-12-2020 2012)   vancomycin 2,000 mg (2020-12-12 2104)    Assessment: Pt was admitted for DKA. He has been on apixaban PTA for a hx PE? Last dose in the past week? Heparin has been ordered here for new afib. Baseline PTT 28. Cbc wnl.   Goal of Therapy:  Heparin level 0.3-0.7 units/ml aPTT 66-102 seconds Monitor platelets by anticoagulation protocol: Yes   Plan:  Heparin 4000 units bolus x1 Heparin  infusion 1550 units/hr Check PTT and HL in AM  Ulyses Southward, PharmD, [place], AAHIVP, CPP Infectious Disease Pharmacist 12-Dec-2020 9:56 PM

## 2020-12-08 NOTE — ED Notes (Addendum)
Place patient on monitor .place on a gown .place blanket warm on patient.ekg done

## 2020-12-08 NOTE — ED Provider Notes (Signed)
[facility] EMERGENCY DEPARTMENT Provider Note   CSN: [id number] Arrival date & time: 12/02/2020  1353     History Chief Complaint  Patient presents with   Code Sepsis    Samuel [name] is a 70 y.o. male.  HPI     70 year old male with a history of diabetes, peripheral vascular disease, pulmonary embolus, history of osteomyelitis and BKA, gastroparesis, presents with nausea, vomiting, altered mental status.   Spoke with pt's cousin's wife: Dehydrated, couldn't urinate, couldn't get anything to come out, gave him some water  Has had nausea and vomiting for years /gastroparesis but worse over the last 3-4 days Monday came home from the hospital Put a cold compress on his head because he felt hot, not sure if he had fever No cough, no sore throat, no pain with urination Yesterday peed in bed No diarrhea, has not been eating   Lives 2 doors down, husband is his cousin.  Checking on him today.   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 of 2012   Shortness of breath    exertion   Type II diabetes mellitus (HCC)     Patient Active Problem List   Diagnosis Date Noted   DKA (diabetic ketoacidosis) (HCC) 11/28/2020   Cellulitis of right lower extremity 09/01/2020   Excessive cerumen in left ear canal 09/01/2020   S/P BKA (below knee amputation) unilateral, left (HCC) 06/22/2020   Controlled type 2  diabetes with neuropathy (HCC) 06/22/2020   Diabetes mellitus (HCC) 06/22/2020   PVD (peripheral vascular disease) (HCC) 04/11/2020   Poorly controlled type 2 diabetes mellitus (HCC) 04/11/2020   Elevated cholesterol 04/11/2020   Hyperglycemia 09/23/2019   DVT (deep venous thrombosis) (HCC) 06/09/2019   DKA (diabetic ketoacidoses) 10/01/2018   Gastroparesis 08/17/2018   Chest pain 08/16/2018   DKA, type 2 (HCC) 02/03/2018   DOE (dyspnea on exertion) 01/24/2018   Uncontrolled diabetes mellitus (HCC) 09/23/2017   DKA, type 2, not at goal [facility]) 09/22/2017   S/P BKA (below knee amputation) (HCC) 04/19/2017   COPD (chronic obstructive pulmonary disease) (HCC) 04/19/2017   NSTEMI (non-ST elevated myocardial infarction) (HCC) 04/07/2017   Osteomyelitis of foot (HCC)    Protein-calorie malnutrition, severe (HCC) 03/08/2015   Osteomyelitis of foot, acute (HCC) 03/07/2015   Diabetes mellitus type 2, uncontrolled (HCC) 03/07/2015   Normocytic normochromic anemia 03/07/2015   Obesity (BMI 30-39.9) 03/07/2015   Nausea & vomiting    DM (diabetes mellitus), type 2 (HCC) 01/07/2014   Chronic osteomyelitis involving lower leg (HCC) 01/07/2014   Vomiting 12/22/2013   Diabetic gastroparesis (HCC) 12/21/2013   Intractable nausea and vomiting 12/21/2013   Essential hypertension 05/01/2013   Hyperlipidemia due to type 2 diabetes mellitus (HCC) 05/01/2013   Venous stasis of lower extremity 04/15/2013  Generalized weakness 03/20/2013   Back pain 03/20/2013   Pulmonary embolism (HCC) 03/27/2011   PERFORATION OF GALLBLADDER 08/30/2010   Ulcer of lower limb, unspecified 03/01/2010   METHICILLIN SUSCEPTIBLE STAPH AUREUS SEPTICEMIA 01/25/2010   Acute osteomyelitis, ankle and foot 01/25/2010   NAUSEA AND VOMITING 01/25/2010   GERD 11/09/2009   CAD S/P DES PCI to mLAD: Xience DES 2.75 x 15 (3.0 mm) 05/01/2009   DIABETES, TYPE 1 05/14/2008   OBSTRUCTIVE SLEEP APNEA 04/30/2008   ALLERGIC RHINITIS,  SEASONAL 04/30/2008    Past Surgical History:  Procedure Laterality Date   AMPUTATION Left 03/08/2015   Procedure:  LEFT AMPUTATION BELOW KNEE;  Surgeon: Toni ArthursJohn Hewitt, MD;  Location: [facility] ORS;  Service: Orthopedics;  Laterality: Left;   APPLICATION OF WOUND VAC Left 01/07/2014   CHOLECYSTECTOMY  06/2010   CORONARY ANGIOPLASTY WITH STENT PLACEMENT  05/2009   "1"   FOOT SURGERY Left 2010   "for Charcot's joint"   HARDWARE REMOVAL Left 01/07/2014   Procedure: LEFT LEG REMOVAL OF DEEP IMPLANT AND SEQUESTRECTOMY; APPLICATION OF WOUND VAC ;  Surgeon: Toni ArthursJohn Hewitt, MD;  Location: [facility] OR;  Service: Orthopedics;  Laterality: Left;   I & D EXTREMITY Left "multiple"   leg   I & D EXTREMITY Left 01/07/2014   Procedure: 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;  Surgeon: Toni ArthursJohn Hewitt, MD;  Location: [facility] OR;  Service: Orthopedics;  Laterality: Left;   IM NAILING TIBIA Left ~ 2012   LEFT HEART CATH AND CORONARY ANGIOGRAPHY N/A 04/08/2017   Procedure: LEFT HEART CATH AND CORONARY ANGIOGRAPHY;  Surgeon: Runell GessBerry, Jonathan J, MD;  Location: [facility] INVASIVE CV LAB;  Service: Cardiovascular;  Laterality: N/A;   SHOULDER ARTHROSCOPY W/ ROTATOR CUFF REPAIR Left    "and bone spurs"   TIBIAL IM ROD REMOVAL Left 01/07/2014   TOE AMPUTATION Right ~ 2011   "great toe"   VENA CAVA FILTER PLACEMENT  2012   VENA CAVA FILTER PLACEMENT N/A 11/20/2016   Procedure: INSERTION VENA-CAVA FILTER;  Surgeon: Sherren KernsFields, Charles E, MD;  [facility];  Service: Vascular;  Laterality: N/A;   WOUND DEBRIDEMENT Left 01/07/2014   "tibia"       Family History  Problem Relation Age of Onset   COPD Mother    Heart disease Mother    Lung cancer Mother    Retinal detachment Father    Alcoholism Brother    Heart disease Brother    COPD Brother    Diabetes Brother    Hypertension Brother    Stroke Brother     Social History   Tobacco Use   Smoking status: Never   Smokeless tobacco: Never  Vaping Use   Vaping Use: Never used   Substance Use Topics   Alcohol use: No    Comment: 01/07/2014 'might have a wine cooler a couple times/yr"   Drug use: No    Home Medications Prior to Admission medications   Medication Sig Start Date End Date Taking? Authorizing Provider  acarbose (PRECOSE) 50 MG tablet Take 50 mg by mouth 3 (three) times daily with meals.   Yes [provider]  alum & mag hydroxide-simeth (MAALOX/MYLANTA) 200-200-20 MG/5ML suspension Take 15 mLs by mouth every 6 (six) hours as needed for indigestion or heartburn. 09/25/19  Yes Sheikh, Omair Latif, DO  amLODipine (NORVASC) 10 MG tablet Take 1 tablet (10 mg total) by mouth daily. 11/22/16  Yes Richarda OverlieAbrol, Nayana, MD  apixaban (ELIQUIS) 5 MG TABS tablet Take 5 mg by  mouth 2 (two) times daily.   Yes [provider]  aspirin 81 MG EC tablet Take 1 tablet (81 mg total) by mouth daily. 04/12/17  Yes Glade Lloyd, MD  atorvastatin (LIPITOR) 80 MG tablet Take 1 tablet (80 mg total) by mouth daily at 6 PM. Patient taking differently: Take 80 mg by mouth daily. 04/11/17  Yes Glade Lloyd, MD  clopidogrel (PLAVIX) 75 MG tablet Take 75 mg by mouth daily. 12/18/19  Yes [provider]  diclofenac Sodium (VOLTAREN) 1 % GEL Rub a small grape sized dollop into the sore joints of hands up to 3 times daily as needed for pain. 11/21/20  Yes Mliss Sax, MD  docusate sodium (COLACE) 100 MG capsule Take 100 mg by mouth daily.   Yes [provider]  furosemide (LASIX) 20 MG tablet Take 1 tablet (20 mg total) by mouth daily. 04/12/17  Yes Glade Lloyd, MD  gabapentin (NEURONTIN) 300 MG capsule TAKE 1 CAPSULE BY MOUTH 3 TIMES A DAY Patient taking differently: Take 300 mg by mouth 3 (three) times daily. 09/23/20  Yes Mliss Sax, MD  insulin aspart (NOVOLOG FLEXPEN) 100 UNIT/ML FlexPen Inject 20 Units into the skin 3 (three) times daily with meals. 06/22/20  Yes Shamleffer, Konrad Dolores, MD  insulin degludec (TRESIBA FLEXTOUCH) 200  UNIT/ML FlexTouch Pen Inject 100 Units into the skin daily. 06/22/20  Yes Shamleffer, Konrad Dolores, MD  metoprolol tartrate (LOPRESSOR) 25 MG tablet Take 1 tablet (25 mg total) by mouth 2 (two) times daily. 04/11/17  Yes Glade Lloyd, MD  nitroGLYCERIN (NITROSTAT) 0.4 MG SL tablet Place 1 tablet (0.4 mg total) under the tongue every 5 (five) minutes as needed for chest pain. 04/11/17  Yes Glade Lloyd, MD  [facility] VERIO test strip Use to test blood sugar twice daily 09/24/17  Yes [provider]  pantoprazole (PROTONIX) 40 MG tablet Take 1 tablet (40 mg total) by mouth 2 (two) times daily before a meal. 08/21/18  Yes Mikhail, Pensions consultant, DO  promethazine (PHENERGAN) 25 MG tablet TAKE 1 TO 2 TABLETS BY MOUTH EVERY 4 TO 6 HOURS AS NEEDED FOR NAUSEA Patient taking differently: Take 25-50 mg by mouth every 6 (six) hours as needed for nausea. 11/21/20  Yes Mliss Sax, MD  traMADol (ULTRAM) 50 MG tablet Take 2 tablets (100 mg total) by mouth every 6 (six) hours as needed for moderate pain. 11/21/20  Yes Mliss Sax, MD  promethazine (PHENERGAN) 25 MG/ML injection Inject 0.5 mLs (12.5 mg total) into the muscle every 6 (six) hours as needed for nausea or vomiting. Patient not taking: No sig reported 09/20/20   Mliss Sax, MD  ranolazine (RANEXA) 500 MG 12 hr tablet Take 1 tablet (500 mg total) by mouth 2 (two) times daily. Patient not taking: No sig reported 04/11/17   Glade Lloyd, MD    Allergies    Gabapentin, Relafen [nabumetone], and Codeine  Review of Systems   Review of Systems  Constitutional:  Positive for appetite change and fatigue.  Respiratory:  Negative for cough.   Cardiovascular:  Negative for chest pain.  Gastrointestinal:  Positive for nausea and vomiting. Negative for abdominal pain, constipation and diarrhea.  Genitourinary:  Positive for difficulty urinating.  Skin:  Negative for rash.  Neurological:  Negative for headaches.   Physical  Exam Updated Vital Signs BP 103/60   Pulse (!) 126   Temp (!) 91.4 F (33 C) (Axillary) Comment: RN made aware  Resp (!) 21  Ht  (1.905 m)   Wt 106.6 kg   SpO2 98%   BMI 29.37 kg/m   Physical Exam Vitals and nursing note reviewed.  Constitutional:      General: He is not in acute distress.    Appearance: He is well-developed. He is ill-appearing, toxic-appearing and diaphoretic.     Comments: Sleepy, ill appearing   HENT:     Head: Normocephalic and atraumatic.  Eyes:     Conjunctiva/sclera: Conjunctivae normal.  Cardiovascular:     Rate and Rhythm: Tachycardia present. Rhythm irregular.     Heart sounds: Normal heart sounds. No murmur heard.   No friction rub. No gallop.  Pulmonary:     Effort: Pulmonary effort is normal. Tachypnea present. No respiratory distress.     Breath sounds: Normal breath sounds. No wheezing or rales.  Abdominal:     General: There is no distension.     Palpations: Abdomen is soft.     Tenderness: There is abdominal tenderness. There is no guarding.  Musculoskeletal:     Cervical back: Normal range of motion.  Skin:    General: Skin is warm.  Neurological:     Mental Status: He is oriented to person, place, and time.     GCS: GCS eye subscore is 3. GCS verbal subscore is 5. GCS motor subscore is 6.    ED Results / Procedures / Treatments   Labs (all labs ordered are listed, but only abnormal results are displayed) Labs Reviewed  COMPREHENSIVE METABOLIC PANEL - Abnormal; Notable for the following components:      Result Value   Sodium 121 (*)    Chloride 80 (*)    CO2 <7 (*)    Glucose, Bld 777 (*)    BUN 133 (*)    Creatinine, Ser 4.01 (*)    Calcium 7.7 (*)    Total Protein 6.0 (*)    Albumin 2.6 (*)    Total Bilirubin 3.9 (*)    GFR, Estimated 16 (*)    All other components within normal limits  LACTIC ACID, PLASMA - Abnormal; Notable for the following components:   Lactic Acid, Venous 2.3 (*)    All other components  within normal limits  CBC WITH DIFFERENTIAL/PLATELET - Abnormal; Notable for the following components:   Neutro Abs 8.8 (*)    Lymphs Abs 0.4 (*)    All other components within normal limits  LIPASE, BLOOD - Abnormal; Notable for the following components:   Lipase 689 (*)    All other components within normal limits  BRAIN NATRIURETIC PEPTIDE - Abnormal; Notable for the following components:   B Natriuretic Peptide 564.4 (*)    All other components within normal limits  BASIC METABOLIC PANEL - Abnormal; Notable for the following components:   Sodium 146 (*)    Chloride 117 (*)    Glucose, Bld 134 (*)    BUN 46 (*)    Creatinine, Ser 0.45 (*)    Calcium 8.4 (*)    All other components within normal limits  HEMOGLOBIN A1C - Abnormal; Notable for the following components:   Hgb A1c MFr Bld 7.0 (*)    All other components within normal limits  HEPARIN LEVEL (UNFRACTIONATED) - Abnormal; Notable for the following components:   Heparin Unfractionated <0.10 (*)    All other components within normal limits  LIPID PANEL - Abnormal; Notable for the following components:   HDL 19 (*)    All other components within normal  limits  TSH - Abnormal; Notable for the following components:   TSH 6.441 (*)    All other components within normal limits  GLUCOSE, CAPILLARY - Abnormal; Notable for the following components:   Glucose-Capillary 581 (*)    All other components within normal limits  GLUCOSE, CAPILLARY - Abnormal; Notable for the following components:   Glucose-Capillary 588 (*)    All other components within normal limits  GLUCOSE, CAPILLARY - Abnormal; Notable for the following components:   Glucose-Capillary 567 (*)    All other components within normal limits  GLUCOSE, CAPILLARY - Abnormal; Notable for the following components:   Glucose-Capillary 557 (*)    All other components within normal limits  GLUCOSE, CAPILLARY - Abnormal; Notable for the following components:    Glucose-Capillary 541 (*)    All other components within normal limits  I-STAT CHEM 8, ED - Abnormal; Notable for the following components:   Sodium 118 (*)    Chloride 85 (*)    BUN >130 (*)    Creatinine, Ser 3.00 (*)    Glucose, Bld >700 (*)    Calcium, Ion 0.96 (*)    TCO2 10 (*)    All other components within normal limits  I-STAT VENOUS BLOOD GAS, ED - Abnormal; Notable for the following components:   pH, Ven 7.141 (*)    pCO2, Ven 23.3 (*)    pO2, Ven 57.0 (*)    Bicarbonate 8.0 (*)    TCO2 9 (*)    Acid-base deficit 19.0 (*)    Sodium 118 (*)    Calcium, Ion 0.96 (*)    All other components within normal limits  RESP PANEL BY RT-PCR (FLU A&B, COVID) ARPGX2  MRSA NEXT GEN BY PCR, NASAL  CULTURE, BLOOD (ROUTINE X 2)  CULTURE, BLOOD (ROUTINE X 2)  URINE CULTURE  LACTIC ACID, PLASMA  PROTIME-INR  APTT  HIV ANTIBODY (ROUTINE TESTING W REFLEX)  BETA-HYDROXYBUTYRIC ACID  MAGNESIUM  PHOSPHORUS  URINALYSIS, ROUTINE W REFLEX MICROSCOPIC  RAPID URINE DRUG SCREEN, HOSP PERFORMED  CBC  MAGNESIUM  PHOSPHORUS  BASIC METABOLIC PANEL  BASIC METABOLIC PANEL  BASIC METABOLIC PANEL  BETA-HYDROXYBUTYRIC ACID  BASIC METABOLIC PANEL  MAGNESIUM  HEPARIN LEVEL (UNFRACTIONATED)  APTT    EKG EKG Interpretation  Date/Time:  Thursday December 28, 2020 14:11:53 EDT Ventricular Rate:  126 PR Interval:  144 QRS Duration: 132 QT Interval:  358 QTC Calculation: 519 R Axis:   65 Text Interpretation: Sinus tachycardia with irregular rate Nonspecific intraventricular conduction delay Confirmed by Virgina Norfolk ([id number]) on 28-Dec-2020 3:33:58 PM  Radiology CT Head Wo Contrast  Result Date: 12/28/2020 CLINICAL DATA:  Sepsis, altered mental status EXAM: CT HEAD WITHOUT CONTRAST CT CHEST, ABDOMEN AND PELVIS WITHOUT CONTRAST TECHNIQUE: Contiguous axial images were obtained from the base of the skull through the vertex without intravenous contrast. Multidetector CT imaging of the chest, abdomen  and pelvis was performed following the standard protocol without IV contrast. COMPARISON:  CT chest 11/18/2016, CT abdomen pelvis 01/15/2021, CT head 10/26/2018 FINDINGS: CT HEAD FINDINGS Brain: Normal anatomic configuration. Mild parenchymal volume loss is commensurate with the patient's age and stable since prior examination. No abnormal intra or extra-axial mass lesion or fluid collection. No abnormal mass effect or midline shift. No evidence of acute intracranial hemorrhage or infarct. Ventricular size is normal. Cerebellum unremarkable. Vascular: Unremarkable Skull: Intact Sinuses/Orbits: Paranasal sinuses are clear. Orbits are unremarkable. Other: Mastoid air cells and middle ear cavities are clear. CT CHEST FINDINGS Cardiovascular: Extensive  coronary artery calcification. Probable coronary artery stenting involving the left anterior descending and right coronary arteries. Global cardiac size within normal limits. No pericardial effusion. The central pulmonary arteries are of normal caliber. Mild atherosclerotic calcification within the thoracic aorta. No aortic aneurysm. Left subclavian central venous catheter is in place with its tip within the superior cavoatrial junction. Mediastinum/Nodes: No pathologic thoracic adenopathy. Esophagus unremarkable. Thyroid unremarkable. Lungs/Pleura: Lungs are clear. No pleural effusion or pneumothorax. Musculoskeletal: The osseous structures are age-appropriate. No acute bone abnormality. No lytic or blastic bone lesion identified. CT ABDOMEN AND PELVIS FINDINGS Hepatobiliary: The liver is unremarkable. No intra or extrahepatic biliary ductal dilation. Cholecystectomy has been performed. Pancreas: Marked fatty atrophy of the pancreas. No peripancreatic inflammatory stranding, pancreatic ductal dilation, or peripancreatic fluid collections identified. Spleen: Unremarkable Adrenals/Urinary Tract: No evidence of urolithiasis or hydronephrosis. No definite mass visualized on  this un-enhanced exam. Stomach/Bowel: Stomach, small bowel, and large bowel are unremarkable. Appendix normal. No evidence of obstruction or focal inflammation. No free intraperitoneal gas or fluid. Vascular/Lymphatic: Mild aortoiliac atherosclerotic calcification. No aortic aneurysm. Infrarenal filter in place within the infrarenal cava which appears chronically occluded at the level of the filter. Vascular collateralization to the para vertebral plexus noted. No aortic aneurysm. No pathologic adenopathy within the abdomen and pelvis. Reproductive: The prostate gland is unremarkable. Musculoskeletal: No acute bone abnormality. No lytic or blastic bone lesion identified. Other: None. IMPRESSION: No acute intracranial abnormality. No acute intrathoracic or intra-abdominal pathology identified. No definite radiographic explanation for the patient's reported sepsis and altered mental status. Stable mild intracranial senescent change. Extensive multi-vessel coronary artery calcification. Marked fatty atrophy of the pancreas, unchanged. 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 Inferior vena cava filter in place. Aortic Atherosclerosis (ICD10-I70.0). Electronically Signed   By: Helyn Numbers MD   On: 11/27/2020 23:04   DG CHEST PORT 1 VIEW  Result Date: 11/19/2020 CLINICAL DATA:  Central venous catheter placement EXAM: PORTABLE CHEST 1 VIEW COMPARISON:  11/21/2020 FINDINGS: Lung volumes are small and there is bibasilar atelectasis. Pulmonary insufflation, however, is slightly improved since prior examination. Left subclavian central venous catheter is in place with its tip within the superior vena cava. No pneumothorax or pleural effusion. Cardiac size is within normal limits when accounting for patient positioning and poor pulmonary insufflation. Pulmonary vascularity is normal. IMPRESSION: Left subclavian central venous catheter tip within the superior vena  cava. No pneumothorax. Electronically Signed   By: Helyn Numbers MD   On: 11/26/2020 20:29   DG Chest Port 1 View  Result Date: 12/07/2020 CLINICAL DATA:  70 year old male with history of dyspnea. EXAM: PORTABLE CHEST 1 VIEW COMPARISON:  Chest x-ray 09/23/2019. FINDINGS: Lung volumes are low. Patchy ill-defined opacities noted in the left lung base. Right lung is clear. No pneumothorax. No evidence of pulmonary edema. Heart size is mildly enlarged. Heart size is normal. Upper mediastinal contours are within normal limits allowing for patient positioning. IMPRESSION: 1. Patchy ill-defined opacities in the left lung base concerning for pneumonia or sequela of aspiration. Electronically Signed   By: Trudie Reed M.D.   On: 12/01/2020 17:58   CT CHEST ABDOMEN PELVIS WO CONTRAST  Result Date: 11/23/2020 CLINICAL DATA:  Sepsis, altered mental status EXAM: CT HEAD WITHOUT CONTRAST CT CHEST, ABDOMEN AND PELVIS WITHOUT CONTRAST TECHNIQUE: Contiguous axial images were obtained from the base of the skull through the vertex without intravenous contrast. Multidetector CT imaging of the chest, abdomen and pelvis was performed following  the standard protocol without IV contrast. COMPARISON:  CT chest 11/18/2016, CT abdomen pelvis 01/15/2021, CT head 10/26/2018 FINDINGS: CT HEAD FINDINGS Brain: Normal anatomic configuration. Mild parenchymal volume loss is commensurate with the patient's age and stable since prior examination. No abnormal intra or extra-axial mass lesion or fluid collection. No abnormal mass effect or midline shift. No evidence of acute intracranial hemorrhage or infarct. Ventricular size is normal. Cerebellum unremarkable. Vascular: Unremarkable Skull: Intact Sinuses/Orbits: Paranasal sinuses are clear. Orbits are unremarkable. Other: Mastoid air cells and middle ear cavities are clear. CT CHEST FINDINGS Cardiovascular: Extensive coronary artery calcification. Probable coronary artery stenting  involving the left anterior descending and right coronary arteries. Global cardiac size within normal limits. No pericardial effusion. The central pulmonary arteries are of normal caliber. Mild atherosclerotic calcification within the thoracic aorta. No aortic aneurysm. Left subclavian central venous catheter is in place with its tip within the superior cavoatrial junction. Mediastinum/Nodes: No pathologic thoracic adenopathy. Esophagus unremarkable. Thyroid unremarkable. Lungs/Pleura: Lungs are clear. No pleural effusion or pneumothorax. Musculoskeletal: The osseous structures are age-appropriate. No acute bone abnormality. No lytic or blastic bone lesion identified. CT ABDOMEN AND PELVIS FINDINGS Hepatobiliary: The liver is unremarkable. No intra or extrahepatic biliary ductal dilation. Cholecystectomy has been performed. Pancreas: Marked fatty atrophy of the pancreas. No peripancreatic inflammatory stranding, pancreatic ductal dilation, or peripancreatic fluid collections identified. Spleen: Unremarkable Adrenals/Urinary Tract: No evidence of urolithiasis or hydronephrosis. No definite mass visualized on this un-enhanced exam. Stomach/Bowel: Stomach, small bowel, and large bowel are unremarkable. Appendix normal. No evidence of obstruction or focal inflammation. No free intraperitoneal gas or fluid. Vascular/Lymphatic: Mild aortoiliac atherosclerotic calcification. No aortic aneurysm. Infrarenal filter in place within the infrarenal cava which appears chronically occluded at the level of the filter. Vascular collateralization to the para vertebral plexus noted. No aortic aneurysm. No pathologic adenopathy within the abdomen and pelvis. Reproductive: The prostate gland is unremarkable. Musculoskeletal: No acute bone abnormality. No lytic or blastic bone lesion identified. Other: None. IMPRESSION: No acute intracranial abnormality. No acute intrathoracic or intra-abdominal pathology identified. No definite  radiographic explanation for the patient's reported sepsis and altered mental status. Stable mild intracranial senescent change. Extensive multi-vessel coronary artery calcification. Marked fatty atrophy of the pancreas, unchanged. 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 Inferior vena cava filter in place. Aortic Atherosclerosis (ICD10-I70.0). Electronically Signed   By: Helyn Numbers MD   On: Dec 19, 2020 23:04    Procedures .Critical Care  Date/Time: 12/09/2020 12:04 AM Performed by: Alvira Monday, MD Authorized by: Alvira Monday, MD   Critical care provider statement:    Critical care time (minutes):  60   Critical care was time spent personally by me on the following activities:  Discussions with consultants, evaluation of patient's response to treatment, examination of patient, ordering and performing treatments and interventions, ordering and review of laboratory studies, ordering and review of radiographic studies, pulse oximetry, re-evaluation of patient's condition, obtaining history from patient or surrogate and review of old charts Angiocath insertion  Date/Time: 12/09/2020 12:05 AM Performed by: Alvira Monday, MD Authorized by: Alvira Monday, MD  Consent: Verbal consent obtained. Risks and benefits: risks, benefits and alternatives were discussed Time out: Immediately prior to procedure a "time out" was called to verify the correct patient, procedure, equipment, support staff and site/side marked as required. Preparation: Patient was prepped and draped in the usual sterile fashion. Local anesthesia used: no  Anesthesia: Local anesthesia used: no  Sedation: Patient sedated: no  Patient tolerance: patient tolerated the procedure well with no immediate complications     Medications Ordered in ED Medications  insulin regular, human (MYXREDLIN) 100 units/ 100 mL infusion (11.5 Units/hr Intravenous New  Bag/Given 11/25/2020 2001)  dextrose 5 % in lactated ringers infusion (has no administration in time range)  dextrose 50 % solution 0-50 mL (has no administration in time range)  potassium chloride 10 mEq in 100 mL IVPB (has no administration in time range)  polyethylene glycol (MIRALAX / GLYCOLAX) packet 17 g (has no administration in time range)  pantoprazole (PROTONIX) injection 40 mg (has no administration in time range)  clotrimazole (LOTRIMIN) 1 % cream (has no administration in time range)  lactated ringers 1,000 mL with potassium chloride 20 mEq infusion ( Intravenous New Bag/Given 12/05/2020 2003)  ceFEPIme (MAXIPIME) 2 g in sodium chloride 0.9 % 100 mL IVPB (has no administration in time range)  vancomycin variable dose per unstable renal function (pharmacist dosing) (has no administration in time range)  Chlorhexidine Gluconate Cloth 2 % PADS 6 each (6 each Topical Given 12/14/2020 1946)  norepinephrine (LEVOPHED) 4mg  in premix infusion (0 mcg/min Intravenous Duplicate 11/29/2020 2020)  amiodarone (NEXTERONE) 1.8 mg/mL load via infusion 150 mg (150 mg Intravenous Bolus from Bag 12/04/2020 2312)    Followed by  amiodarone (NEXTERONE PREMIX) 360-4.14 MG/200ML-% (1.8 mg/mL) IV infusion (60 mg/hr Intravenous New Bag/Given 11/18/2020 2312)    Followed by  amiodarone (NEXTERONE PREMIX) 360-4.14 MG/200ML-% (1.8 mg/mL) IV infusion (has no administration in time range)  heparin bolus via infusion 4,000 Units (4,000 Units Intravenous Bolus from Bag 11/19/2020 2214)    Followed by  heparin ADULT infusion 100 units/mL (25000 units/269mL) (1,550 Units/hr Intravenous New Bag/Given 11/17/2020 2214)  lactated ringers bolus 1,500 mL (0 mLs Intravenous Stopped 11/17/2020 1859)  ceFEPIme (MAXIPIME) 2 g in sodium chloride 0.9 % 100 mL IVPB (2 g Intravenous New Bag/Given 11/23/2020 1718)  metroNIDAZOLE (FLAGYL) IVPB 500 mg (500 mg Intravenous New Bag/Given 11/19/2020 2053)  vancomycin (VANCOREADY) IVPB 2000 mg/400 mL (2,000 mg  Intravenous New Bag/Given 11/26/2020 2104)  sodium chloride 0.9 % bolus 2,000 mL (2,000 mLs Intravenous New Bag/Given (Non-Interop) 12/15/2020 1632)  sodium chloride 0.9 % bolus 2,000 mL (2,000 mLs Intravenous New Bag/Given 11/26/2020 1859)  lactated ringers bolus 1,000 mL (1,000 mLs Intravenous New Bag/Given 11/18/2020 1900)    ED Course  I have reviewed the triage vital signs and the nursing notes.  Pertinent labs & imaging results that were available during my care of the patient were reviewed by me and considered in my medical decision making (see chart for details).    MDM Rules/Calculators/A&P                           70 year old male with a history of diabetes, peripheral vascular disease, pulmonary embolus, history of osteomyelitis and BKA, gastroparesis, presents with nausea, vomiting, altered mental status.  Arrives to the ED tachycardic, hypotensive to 50s, hypothermic to 91, and appears sleepy.  Given hypotensive, hypothermic and encephalopathy, ordered IV fluids and empiric abx per sepsis protocol.  Labs returned show anion gap metabolic acidosis and hyperglycemia with only mildly elevated lactic acid and UA/beta hydroxybuturate pending but suspect DKA.  Ordered insulin gtt in addition to fluids/abx ordered.  Suspect by history provided by family and pt encephalopathy is metabolic, doubt ICH/CVA.  Plan on ICU admission.    Final Clinical Impression(s) / ED Diagnoses Final diagnoses:  Diabetic ketoacidosis without coma  associated with type 2 diabetes mellitus (HCC)  Hypotension, unspecified hypotension type  Metabolic encephalopathy  Metabolic acidosis  Atrial fibrillation, unspecified type [facility])    Rx / DC Orders ED Discharge Orders     None        Alvira Monday, MD 12/09/20 0005

## 2020-12-08 NOTE — ED Notes (Signed)
This RN entered the room and pt had removed the IV due to tossing and turning in the bed. Pt is uncooperative with interventions and EDP observed and ordered soft restraints.

## 2020-12-08 NOTE — Sepsis Progress Note (Signed)
Secure chat with bedside RN. Unable to get an accurate weight as the patient is not cooperative and the bed scale is broken. Also, RN stated that abx were behind as bedside provider requested fluid bolus go in first as "that is where he believes the issue is coming from" and also due to accidental removal of IV by patient.

## 2020-12-09 ENCOUNTER — Inpatient Hospital Stay (HOSPITAL_COMMUNITY): Payer: HMO

## 2020-12-09 DIAGNOSIS — I38 Endocarditis, valve unspecified: Secondary | ICD-10-CM

## 2020-12-09 LAB — GLUCOSE, CAPILLARY
Glucose-Capillary: 112 mg/dL — ABNORMAL HIGH (ref 70–99)
Glucose-Capillary: 117 mg/dL — ABNORMAL HIGH (ref 70–99)
Glucose-Capillary: 118 mg/dL — ABNORMAL HIGH (ref 70–99)
Glucose-Capillary: 120 mg/dL — ABNORMAL HIGH (ref 70–99)
Glucose-Capillary: 121 mg/dL — ABNORMAL HIGH (ref 70–99)
Glucose-Capillary: 128 mg/dL — ABNORMAL HIGH (ref 70–99)
Glucose-Capillary: 136 mg/dL — ABNORMAL HIGH (ref 70–99)
Glucose-Capillary: 136 mg/dL — ABNORMAL HIGH (ref 70–99)
Glucose-Capillary: 139 mg/dL — ABNORMAL HIGH (ref 70–99)
Glucose-Capillary: 139 mg/dL — ABNORMAL HIGH (ref 70–99)
Glucose-Capillary: 140 mg/dL — ABNORMAL HIGH (ref 70–99)
Glucose-Capillary: 142 mg/dL — ABNORMAL HIGH (ref 70–99)
Glucose-Capillary: 152 mg/dL — ABNORMAL HIGH (ref 70–99)
Glucose-Capillary: 170 mg/dL — ABNORMAL HIGH (ref 70–99)
Glucose-Capillary: 221 mg/dL — ABNORMAL HIGH (ref 70–99)
Glucose-Capillary: 285 mg/dL — ABNORMAL HIGH (ref 70–99)
Glucose-Capillary: 371 mg/dL — ABNORMAL HIGH (ref 70–99)
Glucose-Capillary: 449 mg/dL — ABNORMAL HIGH (ref 70–99)
Glucose-Capillary: 456 mg/dL — ABNORMAL HIGH (ref 70–99)
Glucose-Capillary: 489 mg/dL — ABNORMAL HIGH (ref 70–99)
Glucose-Capillary: 500 mg/dL — ABNORMAL HIGH (ref 70–99)

## 2020-12-09 LAB — BASIC METABOLIC PANEL
Anion gap: 26 — ABNORMAL HIGH (ref 5–15)
Anion gap: 24 — ABNORMAL HIGH (ref 5–15)
Anion gap: 16 — ABNORMAL HIGH (ref 5–15)
Anion gap: 11 (ref 5–15)
Anion gap: 9 (ref 5–15)
BUN: 118 mg/dL — ABNORMAL HIGH (ref 8–23)
BUN: 119 mg/dL — ABNORMAL HIGH (ref 8–23)
BUN: 106 mg/dL — ABNORMAL HIGH (ref 8–23)
BUN: 87 mg/dL — ABNORMAL HIGH (ref 8–23)
BUN: 69 mg/dL — ABNORMAL HIGH (ref 8–23)
CO2: 9 mmol/L — ABNORMAL LOW (ref 22–32)
CO2: 10 mmol/L — ABNORMAL LOW (ref 22–32)
CO2: 18 mmol/L — ABNORMAL LOW (ref 22–32)
CO2: 24 mmol/L (ref 22–32)
CO2: 22 mmol/L (ref 22–32)
Calcium: 7.1 mg/dL — ABNORMAL LOW (ref 8.9–10.3)
Calcium: 7.2 mg/dL — ABNORMAL LOW (ref 8.9–10.3)
Calcium: 7.6 mg/dL — ABNORMAL LOW (ref 8.9–10.3)
Calcium: 7.6 mg/dL — ABNORMAL LOW (ref 8.9–10.3)
Calcium: 7.8 mg/dL — ABNORMAL LOW (ref 8.9–10.3)
Chloride: 87 mmol/L — ABNORMAL LOW (ref 98–111)
Chloride: 93 mmol/L — ABNORMAL LOW (ref 98–111)
Chloride: 98 mmol/L (ref 98–111)
Chloride: 102 mmol/L (ref 98–111)
Chloride: 108 mmol/L (ref 98–111)
Creatinine, Ser: 3.67 mg/dL — ABNORMAL HIGH (ref 0.61–1.24)
Creatinine, Ser: 3.23 mg/dL — ABNORMAL HIGH (ref 0.61–1.24)
Creatinine, Ser: 2.68 mg/dL — ABNORMAL HIGH (ref 0.61–1.24)
Creatinine, Ser: 1.9 mg/dL — ABNORMAL HIGH (ref 0.61–1.24)
Creatinine, Ser: 1.53 mg/dL — ABNORMAL HIGH (ref 0.61–1.24)
GFR, Estimated: 17 mL/min — ABNORMAL LOW (ref >60)
GFR, Estimated: 20 mL/min — ABNORMAL LOW (ref >60)
GFR, Estimated: 25 mL/min — ABNORMAL LOW (ref >60)
GFR, Estimated: 38 mL/min — ABNORMAL LOW (ref >60)
GFR, Estimated: 50 mL/min — ABNORMAL LOW (ref >60)
Glucose, Bld: 606 mg/dL (ref 70–99)
Glucose, Bld: 491 mg/dL — ABNORMAL HIGH (ref 70–99)
Glucose, Bld: 163 mg/dL — ABNORMAL HIGH (ref 70–99)
Glucose, Bld: 118 mg/dL — ABNORMAL HIGH (ref 70–99)
Glucose, Bld: 148 mg/dL — ABNORMAL HIGH (ref 70–99)
Potassium: 4 mmol/L (ref 3.5–5.1)
Potassium: 3.5 mmol/L (ref 3.5–5.1)
Potassium: 2.9 mmol/L — ABNORMAL LOW (ref 3.5–5.1)
Potassium: 3.2 mmol/L — ABNORMAL LOW (ref 3.5–5.1)
Potassium: 3.8 mmol/L (ref 3.5–5.1)
Sodium: 122 mmol/L — ABNORMAL LOW (ref 135–145)
Sodium: 127 mmol/L — ABNORMAL LOW (ref 135–145)
Sodium: 132 mmol/L — ABNORMAL LOW (ref 135–145)
Sodium: 137 mmol/L (ref 135–145)
Sodium: 139 mmol/L (ref 135–145)

## 2020-12-09 LAB — BLOOD CULTURE ID PANEL (REFLEXED) - BCID2
A.calcoaceticus-baumannii: NOT DETECTED
Bacteroides fragilis: NOT DETECTED
Candida albicans: NOT DETECTED
Candida auris: NOT DETECTED
Candida glabrata: NOT DETECTED
Candida krusei: NOT DETECTED
Candida parapsilosis: NOT DETECTED
Candida tropicalis: NOT DETECTED
Cryptococcus neoformans/gattii: NOT DETECTED
Enterobacter cloacae complex: NOT DETECTED
Enterobacterales: NOT DETECTED
Enterococcus Faecium: NOT DETECTED
Enterococcus faecalis: NOT DETECTED
Escherichia coli: NOT DETECTED
Haemophilus influenzae: NOT DETECTED
Klebsiella aerogenes: NOT DETECTED
Klebsiella oxytoca: NOT DETECTED
Klebsiella pneumoniae: NOT DETECTED
Listeria monocytogenes: NOT DETECTED
Meth resistant mecA/C and MREJ: NOT DETECTED
Methicillin resistance mecA/C: DETECTED — AB
Neisseria meningitidis: NOT DETECTED
Proteus species: NOT DETECTED
Pseudomonas aeruginosa: NOT DETECTED
Salmonella species: NOT DETECTED
Serratia marcescens: NOT DETECTED
Staphylococcus aureus (BCID): DETECTED — AB
Staphylococcus epidermidis: DETECTED — AB
Staphylococcus lugdunensis: NOT DETECTED
Staphylococcus species: DETECTED — AB
Stenotrophomonas maltophilia: NOT DETECTED
Streptococcus agalactiae: NOT DETECTED
Streptococcus pneumoniae: NOT DETECTED
Streptococcus pyogenes: NOT DETECTED
Streptococcus species: NOT DETECTED

## 2020-12-09 LAB — CBC
HCT: 37.7 % — ABNORMAL LOW (ref 39.0–52.0)
Hemoglobin: 13 g/dL (ref 13.0–17.0)
MCH: 29.8 pg (ref 26.0–34.0)
MCHC: 34.5 g/dL (ref 30.0–36.0)
MCV: 86.5 fL (ref 80.0–100.0)
Platelets: 230 10*3/uL (ref 150–400)
RBC: 4.36 MIL/uL (ref 4.22–5.81)
RDW: 13.4 % (ref 11.5–15.5)
WBC: 8.2 10*3/uL (ref 4.0–10.5)
nRBC: 0 % (ref 0.0–0.2)

## 2020-12-09 LAB — PROCALCITONIN: Procalcitonin: 0.93 ng/mL

## 2020-12-09 LAB — POCT I-STAT 7, (LYTES, BLD GAS, ICA,H+H)
Acid-base deficit: 3 mmol/L — ABNORMAL HIGH (ref 0.0–2.0)
Acid-base deficit: 9 mmol/L — ABNORMAL HIGH (ref 0.0–2.0)
Bicarbonate: 13.9 mmol/L — ABNORMAL LOW (ref 20.0–28.0)
Bicarbonate: 20.4 mmol/L (ref 20.0–28.0)
Calcium, Ion: 1.09 mmol/L — ABNORMAL LOW (ref 1.15–1.40)
Calcium, Ion: 1.15 mmol/L (ref 1.15–1.40)
HCT: 34 % — ABNORMAL LOW (ref 39.0–52.0)
HCT: 38 % — ABNORMAL LOW (ref 39.0–52.0)
Hemoglobin: 11.6 g/dL — ABNORMAL LOW (ref 13.0–17.0)
Hemoglobin: 12.9 g/dL — ABNORMAL LOW (ref 13.0–17.0)
O2 Saturation: 90 %
O2 Saturation: 98 %
Patient temperature: 98.8
Patient temperature: 99.9
Potassium: 3 mmol/L — ABNORMAL LOW (ref 3.5–5.1)
Potassium: 3.6 mmol/L (ref 3.5–5.1)
Sodium: 134 mmol/L — ABNORMAL LOW (ref 135–145)
Sodium: 140 mmol/L (ref 135–145)
TCO2: 15 mmol/L — ABNORMAL LOW (ref 22–32)
TCO2: 21 mmol/L — ABNORMAL LOW (ref 22–32)
pCO2 arterial: 22.4 mmHg — ABNORMAL LOW (ref 32.0–48.0)
pCO2 arterial: 31.3 mmHg — ABNORMAL LOW (ref 32.0–48.0)
pH, Arterial: 7.404 (ref 7.350–7.450)
pH, Arterial: 7.424 (ref 7.350–7.450)
pO2, Arterial: 104 mmHg (ref 83.0–108.0)
pO2, Arterial: 57 mmHg — ABNORMAL LOW (ref 83.0–108.0)

## 2020-12-09 LAB — HEPARIN LEVEL (UNFRACTIONATED)
Heparin Unfractionated: 0.56 IU/mL (ref 0.30–0.70)
Heparin Unfractionated: 0.44 IU/mL (ref 0.30–0.70)

## 2020-12-09 LAB — ECHOCARDIOGRAM COMPLETE
Area-P 1/2: 3.42 cm2
Height: 75 in
S' Lateral: 4.4 cm
Weight: 3774.28 oz

## 2020-12-09 LAB — MAGNESIUM
Magnesium: 2.6 mg/dL — ABNORMAL HIGH (ref 1.7–2.4)
Magnesium: 2.4 mg/dL (ref 1.7–2.4)

## 2020-12-09 LAB — PHOSPHORUS: Phosphorus: 4.1 mg/dL (ref 2.5–4.6)

## 2020-12-09 LAB — BETA-HYDROXYBUTYRIC ACID: Beta-Hydroxybutyric Acid: 0.71 mmol/L — ABNORMAL HIGH (ref 0.05–0.27)

## 2020-12-09 LAB — APTT
aPTT: 137 seconds — ABNORMAL HIGH (ref 24–36)
aPTT: 127 seconds — ABNORMAL HIGH (ref 24–36)

## 2020-12-09 MED ORDER — POTASSIUM CHLORIDE 20 MEQ PO PACK
40.0000 meq | PACK | Freq: Once | ORAL | Status: AC
  Administered 2020-12-09: 40 meq
  Filled 2020-12-09: qty 2

## 2020-12-09 MED ORDER — PERFLUTREN LIPID MICROSPHERE
1.0000 mL | INTRAVENOUS | Status: AC | PRN
  Administered 2020-12-09: 4 mL via INTRAVENOUS
  Filled 2020-12-09: qty 10

## 2020-12-09 MED ORDER — SODIUM CHLORIDE 0.9 % IV SOLN
8.0000 mg/kg | INTRAVENOUS | Status: DC
  Filled 2020-12-09: qty 17

## 2020-12-09 MED ORDER — POTASSIUM CHLORIDE 10 MEQ/50ML IV SOLN
10.0000 meq | INTRAVENOUS | Status: AC
  Administered 2020-12-09 (×4): 10 meq via INTRAVENOUS
  Filled 2020-12-09 (×4): qty 50

## 2020-12-09 MED ORDER — POTASSIUM CHLORIDE CRYS ER 20 MEQ PO TBCR: 40.0000 meq | EXTENDED_RELEASE_TABLET | Freq: Once | ORAL | Status: DC

## 2020-12-09 MED ORDER — SODIUM CHLORIDE 0.9 % IV BOLUS: 1000.0000 mL | Freq: Once | INTRAVENOUS | Status: DC

## 2020-12-09 MED ORDER — POTASSIUM CHLORIDE 10 MEQ/100ML IV SOLN
10.0000 meq | INTRAVENOUS | Status: AC
  Administered 2020-12-09 (×3): 10 meq via INTRAVENOUS
  Filled 2020-12-09 (×4): qty 100

## 2020-12-09 MED ORDER — WHITE PETROLATUM EX OINT
TOPICAL_OINTMENT | CUTANEOUS | Status: AC
  Filled 2020-12-09: qty 28.35

## 2020-12-09 MED ORDER — SODIUM CHLORIDE 0.9 % IV SOLN
8.0000 mg/kg | Freq: Every day | INTRAVENOUS | Status: DC
  Administered 2020-12-09 – 2020-12-17 (×9): 850 mg via INTRAVENOUS
  Filled 2020-12-09 (×9): qty 17

## 2020-12-09 MED ORDER — PROSOURCE TF PO LIQD
45.0000 mL | Freq: Every day | ORAL | Status: DC
  Administered 2020-12-10 – 2020-12-13 (×4): 45 mL
  Filled 2020-12-09 (×4): qty 45

## 2020-12-09 MED ORDER — OSMOLITE 1.2 CAL PO LIQD
1000.0000 mL | ORAL | Status: DC
  Administered 2020-12-09: 1000 mL
  Filled 2020-12-09: qty 1000

## 2020-12-09 MED ORDER — PERFLUTREN LIPID MICROSPHERE
1.0000 mL | INTRAVENOUS | Status: AC | PRN
  Filled 2020-12-09: qty 10

## 2020-12-09 NOTE — H&P (Addendum)
This is a progress note     NAME:  Samuel [name], MRN:  [id number], DOB:  06-Feb-1951, LOS: 1 ADMISSION DATE:  12-29-2020, CONSULTATION DATE:  Dec 29, 2020 CHIEF COMPLAINT:  DKA   History of Present Illness:  Patient presents with N/V, obtunded. Unable to provide history or get ahold of family. History obtained from chart review and per report.  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 He had 3 days of increased vomiting, per family.  Pertinent  Medical History  IDT2DM, CAD, HTN, h/o MRSA sepsis, h/o osteomyelitis, h/o PE, s/p left BKA  Significant Hospital Events: Including procedures, antibiotic start and stop dates in addition to other pertinent events   Cefepime (6/23-) Flagyl (6/23-) Vancomycin (6/23-)  Interim History / Subjective:  Starting to answer one word questions. Periods of lethargy overnight and some hypotension that responded to fluids.  Objective   Blood pressure 122/61, pulse (!) 101, temperature 99.86 F (37.7 C), resp. rate 18, height 6\' 3"  (1.905 m), weight 107 kg, SpO2 96 %. CVP:  [2 mmHg-11 mmHg] 5 mmHg      Intake/Output Summary (Last 24 hours) at 12/09/2020 12/11/2020 Last data filed at 12/09/2020 12/11/2020 Gross per 24 hour  Intake 3455.94 ml  Output 800 ml  Net 2655.94 ml   Filed Weights   12-29-20 2100 12/09/20 0500  Weight: 106.6 kg 107 kg    Examination: Constitutional: disheveled chronically ill appearing man laying in bed  Eyes: pupils equal, intermittent tracking Ears, nose, mouth, and throat: MM dry, trachea midline Cardiovascular: tachycardic, ext warm Respiratory: Clear, no wheezing or accessory muscle use Gastrointestinal: soft, +BS Skin: chronic venous/diabetic changes of RLE, has L BKA Neurologic: withdraws x 4 Psychiatric: RASS -1 to 0   Labs/imaging that I have personally reviewed  (right click and "Reselect all SmartList Selections" daily)   Resolved Hospital Problem list   ABG ok CBG improved Gap still up CBC okay CT A/P with IVC  filter clot and collaterals  Assessment & Plan:   DKA in patient with poorly controlled Type 2 DM Obesity Hx CAD Hx OSA noncompliant CPAP  Hypovolemic vs. Less likely septic shock Poorly controlled DM Afib converted with amio Metabolic encephalopathy improving Chronic RLE wounds  - Continue abx, check pct, dc abx if < 0.5 - Continue endotool fluids/insulin until gap closes - amio/heparin drip today, has hx of PE with IVC filter (clotted in place) for ?noncompliance, does not make much sense - Encourage day/night cycles - Cortrak today since they are not here on weekends   Best practice:  Diet: npo Pain/Anxiety/Delirium protocol (if indicated): n/a VAP protocol (if indicated): n/a DVT prophylaxis: heparin gtt for  GI prophylaxis: PPI Glucose control: endotool Mobility: BR Code Status: full Family Communication: will reach out later Disposition: ICU pending mental status improvement and pressor liberation  Patient critically ill due to shock, DKA Interventions to address this today insulin, pressor titration Risk of deterioration without these interventions is high  I personally spent 38 minutes providing critical care not including any separately billable procedures  12/11/20 MD [facility]  Prefer epic messenger for cross cover needs If after hours, please call E-link

## 2020-12-09 NOTE — Progress Notes (Signed)
eLink Physician-Brief Progress Note Patient Name: Samuel [name] DOB: [birth year] MRN: [id number]   Date of Service  12/09/2020  HPI/Events of Note  Camera eval for worsening hypoxemia, unable to clear cough, gurgling in throat. Sats 88%. Able to cough, but not able to clear throat . On nasal o2. As per RN, might not able to tolerate BiPAP. NG TF held.    eICU Interventions  worsening AHRF, encephalopathy. mild hemoptysis, not able to clear oral secretions-thick cough. on heparin gtt for a fib. getting a stat ABG. tried nebs , suctioning etc.  sats dips < 88%. CxR stat: new rt lower lobe asp pneumonia.  -CCM team notified-need evaluation for intubation.      Intervention Category Major Interventions: Respiratory failure - evaluation and management  Ranee Gosselin 12/09/2020, 11:36 PM

## 2020-12-09 NOTE — Progress Notes (Signed)
eLink Physician-Brief Progress Note Patient Name: Samuel [name] DOB: 1951-05-12 MRN: [id number]   Date of Service  12/09/2020  HPI/Events of Note  Coughing up blood sputu, occassionally desats to 80s, on 10L [place].   Camera: On nasal o2. Sats 96%. MAP 76. HR 36   70 y.o. male with a history of type 2 DM, OSA, history of osteomyelitis, s/p left BKA and came in with altered mental status on 6/23. Blood cultures positive in 3/4 bottles, BCID positive for Staph epi and Staph aureus, mec A/C detected  Hg 12.9 Plt  0.44 - heparin gtt for a fib.   eICU Interventions  - check CxR, apTT Discussed with covering RN. Now RT post suctioning sats improved.       Intervention Category Intermediate Interventions: Respiratory distress - evaluation and management  Ranee Gosselin 12/09/2020, 10:20 PM

## 2020-12-09 NOTE — Progress Notes (Signed)
Inpatient Diabetes Program Recommendations  AACE/ADA: New Consensus Statement on Inpatient Glycemic Control (2015)  Target Ranges:  Prepandial:   less than 140 mg/dL      Peak postprandial:   less than 180 mg/dL (1-2 hours)      Critically ill patients:  140 - 180 mg/dL   Lab Results  Component Value Date   GLUCAP 139 (H) 12/09/2020   HGBA1C 7.0 (H) 12/10/2020    Review of Glycemic Control Results for Samuel [name], Samuel "DAN" (MRN [id number]) as of 12/09/2020 10:15  Ref. Range 12/09/2020 05:59 12/09/2020 07:32 12/09/2020 08:37 12/09/2020 09:28  Glucose-Capillary Latest Ref Range: 70 - 99 mg/dL 032 (H) 122 (H) 482 (H) 139 (H)   Diabetes history: Type 2 DM Outpatient Diabetes medications: Tresiba 100 units QD, Novolog 20 units TID Current orders for Inpatient glycemic control: IV insulin  Inpatient Diabetes Program Recommendations:    At this time, continue with IV insulin until acidosis clears. Following.   Patient is followed by Dr Lonzo Cloud, outpatient endocrinology with last appointment 06/22/20.   Thanks, Lujean Rave, MSN, RNC-OB Diabetes Coordinator [phone] (8a-5p)

## 2020-12-09 NOTE — Progress Notes (Signed)
  Echocardiogram 2D Echocardiogram has been performed.  Delcie Roch 12/09/2020, 11:49 AM

## 2020-12-09 NOTE — Progress Notes (Signed)
eLink Physician-Brief Progress Note Patient Name: Samuel [name] DOB: [birth year] MRN: [id number]   Date of Service  12/09/2020  HPI/Events of Note  Nursing concern for decreased LOC and episodes of desaturation. Blood glucose = 221. CVP = 6.   eICU Interventions  Plan: Bolus with 0.9 NaCl 1 liter IV over 1 hour now. ABG STAT. Will request that the PCCM ground team re-evaluate the patient at bedside.      Intervention Category Major Interventions: Hypovolemia - evaluation and treatment with fluids;Change in mental status - evaluation and management  Madelline Eshbach Eugene 12/09/2020, 6:18 AM

## 2020-12-09 NOTE — Consult Note (Signed)
WOC Nurse Consult Note: Patient receiving care in [facility] 2M11 Reason for Consult: RLE wound Wound type: Abrasions and skin tears to the RLE with some edema and redness surrounding the area.  Pressure Injury POA: NA Wound bed: Red/moist Drainage (amount, consistency, odor) Serosanguinous drainage on dressing.  Dressing procedure/placement/frequency: Apply Xeroform gauze to the open wounds on the RLE and secure with Kerlix. Change daily. Dressing completed today.   Monitor the wound area(s) for worsening of condition such as: Signs/symptoms of infection, increase in size, development of or worsening of odor, development of pain, or increased pain at the affected locations.   Notify the medical team if any of these develop.  Thank you for the consult. WOC nurse will not follow at this time.   Please re-consult the WOC team if needed.  Renaldo Reel Katrinka Blazing, MSN, RN, CMSRN, Angus Seller, [facility] Wound Treatment Associate Pager [phone]

## 2020-12-09 NOTE — Progress Notes (Signed)
ANTICOAGULATION CONSULT NOTE  Pharmacy Consult for heparin Indication: atrial fibrillation  Allergies  Allergen Reactions   Gabapentin Hives, Itching, Nausea And Vomiting and Rash    REACTION: rash/hives (per patient, was a reaction to an inactive ingredient in another mgf brand). The patient stated that he does take this medication now and it doesn't cause a rash any more. LOW DOSE   Relafen [Nabumetone] Nausea And Vomiting and Rash   Codeine Nausea And Vomiting and Rash    Patient Measurements: Height: 6\' 3"  (190.5 cm) Weight: 107 kg (235 lb 14.3 oz) IBW/kg (Calculated) : 84.5 Heparin Dosing Weight: 105kg  Vital Signs: Temp: 98.78 F (37.1 C) (06/24 1700) Temp Source: Bladder (06/24 1600) BP: 130/67 (06/24 1700) Pulse Rate: 96 (06/24 1700)  Labs: Recent Labs    12/12/2020 1403 11/26/2020 1444 12/14/2020 2039 11/17/2020 2200 12/09/20 0200 12/09/20 0640 12/09/20 0730 12/09/20 1512 12/09/20 1700  HGB 15.4 16.0  16.7  --   --  13.0 12.9*  --   --   --   HCT 45.6 47.0  49.0  --   --  37.7* 38.0*  --   --   --   PLT 246  --   --   --  230  --   --   --   --   APTT 28  --   --   --   --   --  137*  --   --   LABPROT 14.5  --   --   --   --   --   --   --   --   INR 1.1  --   --   --   --   --   --   --   --   HEPARINUNFRC  --   --  <0.10*  --   --   --  0.56  --  0.44  CREATININE 4.01* 3.00* 0.45*   < > 3.23*  --  2.68* 1.90*  --    < > = values in this interval not displayed.     Estimated Creatinine Clearance: 49.9 mL/min (A) (by C-G formula based on SCr of 1.9 mg/dL (H)).   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 of 2012   Shortness of breath    exertion   Type II diabetes mellitus (HCC)     Medications:  Medications Prior to Admission  Medication Sig Dispense Refill Last Dose   acarbose (PRECOSE) 50 MG tablet Take 50 mg by mouth 3 (three) times daily with meals.   Past Week   alum & mag hydroxide-simeth (MAALOX/MYLANTA) 200-200-20 MG/5ML suspension Take 15 mLs by mouth every 6 (six) hours as needed for indigestion or heartburn. 355 mL 0 unknown  amLODipine (NORVASC) 10 MG tablet Take 1 tablet (10 mg total) by mouth daily. 30 tablet 2 Past Week   apixaban (ELIQUIS) 5 MG TABS tablet Take 5 mg by mouth 2 (two) times daily.   Past Week   aspirin 81 MG EC tablet Take 1 tablet (81 mg total) by mouth daily. 30 tablet 0 Past Week   atorvastatin (LIPITOR) 80 MG tablet Take 1 tablet (80 mg total) by mouth daily at 6 PM. (Patient taking differently: Take 80 mg by mouth daily.) 30 tablet 0 Past Week   clopidogrel (PLAVIX) 75 MG tablet Take 75 mg by mouth daily.   Past Week   diclofenac Sodium (VOLTAREN) 1 % GEL Rub a small grape sized dollop into the sore joints of hands up to 3 times daily as needed for pain. 150 g 1 unknown   docusate sodium (COLACE) 100 MG capsule Take 100 mg by mouth daily.   Past Week   furosemide (LASIX) 20 MG tablet Take 1 tablet (20 mg total) by mouth daily. 30 tablet 0 Past Week   gabapentin (NEURONTIN) 300 MG capsule TAKE 1 CAPSULE BY MOUTH 3 TIMES A DAY (Patient taking differently: Take 300 mg by mouth 3 (three) times daily.) 90 capsule 6 Past Week   insulin aspart (NOVOLOG FLEXPEN) 100 UNIT/ML FlexPen Inject 20 Units into the skin 3 (three) times daily with meals. 15 mL 11 Past Week   insulin degludec (TRESIBA FLEXTOUCH) 200 UNIT/ML FlexTouch Pen Inject 100 Units  into the skin daily. 15 mL 11 Past Week   metoprolol tartrate (LOPRESSOR) 25 MG tablet Take 1 tablet (25 mg total) by mouth 2 (two) times daily. 60 tablet 0 Past Week   nitroGLYCERIN (NITROSTAT) 0.4 MG SL tablet Place 1 tablet (0.4 mg total) under the tongue every 5 (five) minutes as needed for chest pain. 30 tablet 0 unknown   ONETOUCH VERIO test strip Use to test blood sugar twice daily  4    pantoprazole (PROTONIX) 40 MG tablet Take 1 tablet (40 mg total) by mouth 2 (two) times daily before a meal. 60 tablet 0 Past Week   promethazine (PHENERGAN) 25 MG tablet TAKE 1 TO 2 TABLETS BY MOUTH EVERY 4 TO 6 HOURS AS NEEDED FOR NAUSEA (Patient taking differently: Take 25-50 mg by mouth every 6 (six) hours as needed for nausea.) 240 tablet 1 unknown   traMADol (ULTRAM) 50 MG tablet Take 2 tablets (100 mg total) by mouth every 6 (six) hours as needed for moderate pain. 30 tablet 1 unknown   promethazine (PHENERGAN) 25 MG/ML injection Inject 0.5 mLs (12.5 mg total) into the muscle every 6 (six) hours as needed for nausea or vomiting. (Patient not taking: No sig reported) 25 mL 11 Not Taking   ranolazine (RANEXA) 500 MG 12 hr tablet Take 1 tablet (500 mg total) by mouth 2 (two) times daily. (Patient not taking: No sig reported) 60 tablet 0 Not Taking   Scheduled:   Chlorhexidine Gluconate Cloth  6 each Topical Daily   clotrimazole   Topical BID   feeding supplement (PROSource TF)  45 mL Per Tube Daily   pantoprazole (PROTONIX) IV  40 mg Intravenous Q24H   white petrolatum       Infusions:   amiodarone 30 mg/hr (12/09/20 1700)   DAPTOmycin (CUBICIN)  IV     dextrose 5% lactated ringers 125 mL/hr at 12/09/20 1412   feeding supplement (OSMOLITE 1.2 CAL) 1,000 mL (12/09/20 1815)   heparin 1,550 Units/hr (12/09/20 1700)  insulin 1.2 Units/hr (12/09/20 1700)   norepinephrine (LEVOPHED) Adult infusion Stopped (12/09/20 1447)   potassium chloride 10 mEq (12/09/20 1924)    Assessment: Pt was admitted for  DKA. He has been on apixaban PTA for a hx PE? Last dose in the past week? Heparin has been ordered here for new afib. Baseline PTT 28, HL undetectable.   Initial heparin level therapeutic at 0.56, on drip rate 1550 units/hr. CBC wnl. No overt bleeding or infusion issues noted. Given HL undetectable at baseline, likely not affected by PTA Eliquis given noncompliance - will dose based off HL and not aPTT.   Heparin level this PM therapeutic  Goal of Therapy:  Heparin level 0.3-0.7 units/ml Monitor platelets by anticoagulation protocol: Yes   Plan:  Continue heparin infusion at 1550 units/hr Monitor daily HL, CBC, s/sx bleeding  Thank you Okey Regal, PharmD 12/09/2020  7:26 PM  Please check [email or web address] for unit-specific pharmacy phone numbers.

## 2020-12-09 NOTE — Progress Notes (Signed)
Initial Nutrition Assessment  DOCUMENTATION CODES:   Not applicable  INTERVENTION:   Initiate tube feeding via Cortrak (will avoid TF with fiber, given hx of gastroparesis): Osmolite 1.2 at 20 ml/h (480 ml per day) Prosource TF 45 ml once daily  Recommend advance by 10 ml every 4 hours to goal rate of 75 ml/h to provide 2200 kcal, 111 gm protein, 1476 ml free water daily.  NUTRITION DIAGNOSIS:   Inadequate oral intake related to inability to eat as evidenced by NPO status.  GOAL:   Patient will meet greater than or equal to 90% of their needs  MONITOR:   Labs, TF tolerance, Diet advancement  REASON FOR ASSESSMENT:   Consult Enteral/tube feeding initiation and management  ASSESSMENT:   70 yo male admitted with DKA. PMH includes HTN, CAD, gastroparesis, GERD, HLD, depression, DM-2, PVD, L BKA, OSA.  Discussed patient in ICU rounds and with RN today. Cortrak has been ordered. RD to order TF per discussion with CCM MD; keep at trickle rate overnight, MD to advance to goal rate tomorrow.  Labs reviewed. Na 132, K 2.9 Anion gap 16 trending down CBG: [id number]  Medications reviewed and include insulin drip, levophed, KCl.   Usual weights reviewed. No recent significant weight changes noted.   NUTRITION - FOCUSED PHYSICAL EXAM:  Flowsheet Row Most Recent Value  Orbital Region No depletion  Upper Arm Region No depletion  Thoracic and Lumbar Region No depletion  Buccal Region No depletion  Temple Region Mild depletion  Clavicle Bone Region No depletion  Clavicle and Acromion Bone Region No depletion  Scapular Bone Region Unable to assess  Dorsal Hand No depletion  Patellar Region No depletion  Anterior Thigh Region No depletion  Posterior Calf Region Unable to assess  Edema (RD Assessment) Unable to assess  Hair Reviewed  Eyes Reviewed  Mouth Reviewed  Skin Reviewed  Nails Reviewed       Diet Order:   Diet Order             Diet NPO time  specified  Diet effective now                   EDUCATION NEEDS:   No education needs have been identified at this time  Skin:  Skin Assessment: Skin Integrity Issues: Skin Integrity Issues:: Other (Comment) Other: abrasions and skin tears to RLE  Last BM:  no BM documented  Height:   Ht Readings from Last 1 Encounters:  11/19/2020 6\' 3"  (1.905 m)    Weight:   Wt Readings from Last 1 Encounters:  12/09/20 107 kg    BMI:  Body mass index is 29.48 kg/m.  Estimated Nutritional Needs:   Kcal:  2200-2400  Protein:  110-130 gm  Fluid:  2.2-2.4 L    12/11/20, RD, LDN, CNSC Please refer to Amion for contact information.

## 2020-12-09 NOTE — Progress Notes (Signed)
MSSA and MRSE in blood. On broad coverage. Probably related to his RLE wounds. Will get echo. Narrow abx pending further culture maturation.  Myrla Halsted MD PCCM

## 2020-12-09 NOTE — Consult Note (Signed)
[facility]       Reason for Consult: bacteremia    Referring Physician: CHAMP autoconsult  Active Problems:   DKA (diabetic ketoacidosis) (HCC)    Chlorhexidine Gluconate Cloth  6 each Topical Daily   clotrimazole   Topical BID   pantoprazole (PROTONIX) IV  40 mg Intravenous Q24H    Recommendations: Change to daptomycin Will stop vancomycin and cefepime TTE Repeat blood cultures   Assessment: He has septic shock based now on positive blood cultures and source likely his right leg wound exacerbated by hyperglycemia and volume depletion. Will need to check for endocarditis.   He has acute renal failure, likely secondary to sepsis, hyper glycemia and will use daptomycin and stop vancomycin.    Dr. Renold DonVu will follow up over the weekend  Antibiotics: Vancomycin and cefepime  HPI: Samuel [name] is a 70 y.o. male with a history of type 2 DM, OSA, history of osteomyelitis, s/p left BKA and came in with altered mental status on 6/23.  Brought in by EMS after 3 days of AMS at home.  Patient is alert but not responsive to questions.  No family at bedside.  Afebrile and WBC wnl.  Blood cultures positive in 3/4 bottles, BCID positive for Staph epi and Staph aureus, mec A/C detected.     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 of 2012    Shortness of breath    exertion   Type II diabetes mellitus (HCC)     Social History   Tobacco Use   Smoking status: Never   Smokeless tobacco: Never  Vaping Use   Vaping Use: Never used  Substance Use Topics   Alcohol use: No    Comment: 01/07/2014 'might have a wine cooler a couple times/yr"   Drug use: No    Family History  Problem Relation Age of Onset   COPD Mother    Heart disease Mother    Lung cancer Mother    Retinal detachment Father    Alcoholism Brother    Heart disease Brother    COPD Brother    Diabetes Brother    Hypertension Brother    Stroke Brother     Allergies  Allergen Reactions   Gabapentin Hives, Itching, Nausea And Vomiting and Rash    REACTION: rash/hives (per patient, was a reaction to an inactive ingredient in another mgf brand). The patient stated that he does take this medication now and it doesn't cause a rash any more. LOW DOSE   Relafen [Nabumetone] Nausea And Vomiting and Rash   Codeine Nausea And Vomiting and Rash    Physical Exam: Constitutional: non-responsive Vitals:   12/09/20 0800 12/09/20 0815  BP: (!) 126/59 126/64  Pulse: 97 98  Resp: (!) 32 (!) 30  Temp: 99.68 F (37.6 C) 99.68 F (37.6 C)  SpO2: 97% 98%   EYES: anicteric ENMT: + nasal cannula Cardiovascular: Cor Tachy Respiratory: clear; GI: soft Musculoskeletal: left BKA, no open wounds; right leg wrapped Skin: negatives: no rash Neuro: no response  Lab Results  Component Value Date   WBC 8.2 12/09/2020   HGB 12.9 (L) 12/09/2020   HCT 38.0 (L) 12/09/2020   MCV 86.5 12/09/2020   PLT 230 12/09/2020    Lab Results  Component Value Date   CREATININE 3.23 (H) 12/09/2020   BUN 119 (H) 12/09/2020   NA 134 (L) 12/09/2020   K 3.0 (L) 12/09/2020   CL 93 (L) 12/09/2020   CO2 10 (L) 12/09/2020    Lab Results  Component Value Date   ALT 18 12/23/2020   AST 29 December 23, 2020   ALKPHOS 72 2020/12/23     Microbiology: Recent Results (from the past 240  hour(s))  Culture, blood (Routine x 2)     Status: None (Preliminary result)   Collection Time: 2020/12/23  2:03 PM   Specimen: BLOOD  Result Value Ref Range Status   Specimen Description BLOOD LEFT ANTECUBITAL  Final   Special Requests   Final    BOTTLES DRAWN AEROBIC AND ANAEROBIC Blood Culture results may not be optimal due to an inadequate volume of blood received in culture bottles   Culture  Setup Time   Final    GRAM POSITIVE COCCI IN CLUSTERS AEROBIC BOTTLE ONLY CRITICAL VALUE NOTED.  VALUE IS CONSISTENT WITH PREVIOUSLY REPORTED AND CALLED VALUE. Performed at [name], [address], [place], [place] [address]    Culture GRAM POSITIVE COCCI  Final   Report Status PENDING  Incomplete  Culture, blood (Routine x 2)     Status: None (Preliminary result)   Collection Time: 2020-12-23  2:31 PM   Specimen: BLOOD  Result Value Ref Range Status   Specimen Description BLOOD LEFT ANTECUBITAL  Final   Special Requests   Final    BOTTLES DRAWN AEROBIC AND ANAEROBIC Blood Culture adequate volume   Culture  Setup Time   Final    GRAM POSITIVE COCCI IN CLUSTERS ANAEROBIC BOTTLE ONLY CRITICAL RESULT CALLED TO, READ BACK BY AND VERIFIED WITH: PHARMD J.FRENS AT 0818 ON 12/09/2020 BY T.SAAD. Performed at [facility], [address] [place], [place] [address]    Culture GRAM POSITIVE COCCI  Final   Report Status PENDING  Incomplete  Blood Culture ID Panel (Reflexed)     Status: Abnormal   Collection Time: 23-Dec-2020  2:31 PM  Result Value Ref Range Status   Enterococcus faecalis NOT DETECTED NOT DETECTED Final   Enterococcus Faecium NOT DETECTED NOT DETECTED Final   Listeria monocytogenes NOT DETECTED NOT DETECTED Final   Staphylococcus species DETECTED (A) NOT DETECTED Final    Comment: CRITICAL RESULT CALLED TO, READ BACK BY AND VERIFIED WITH: PHARMD J.FRENS AT 0818 ON 12/09/2020 BY T.SAAD.    Staphylococcus aureus (BCID) DETECTED (A) NOT DETECTED Final    Comment:  CRITICAL RESULT CALLED TO, READ BACK BY AND VERIFIED WITH: PHARMD J.FRENS AT 0818 ON 12/09/2020 BY T.SAAD.    Staphylococcus epidermidis DETECTED (A) NOT DETECTED Final    Comment: CRITICAL RESULT CALLED TO, READ BACK BY AND VERIFIED WITH: PHARMD J.FRENS AT 0818 ON 12/09/2020 BY T.SAAD.    Staphylococcus lugdunensis NOT DETECTED NOT DETECTED Final  Streptococcus species NOT DETECTED NOT DETECTED Final   Streptococcus agalactiae NOT DETECTED NOT DETECTED Final   Streptococcus pneumoniae NOT DETECTED NOT DETECTED Final   Streptococcus pyogenes NOT DETECTED NOT DETECTED Final   A.calcoaceticus-baumannii NOT DETECTED NOT DETECTED Final   Bacteroides fragilis NOT DETECTED NOT DETECTED Final   Enterobacterales NOT DETECTED NOT DETECTED Final   Enterobacter cloacae complex NOT DETECTED NOT DETECTED Final   Escherichia coli NOT DETECTED NOT DETECTED Final   Klebsiella aerogenes NOT DETECTED NOT DETECTED Final   Klebsiella oxytoca NOT DETECTED NOT DETECTED Final   Klebsiella pneumoniae NOT DETECTED NOT DETECTED Final   Proteus species NOT DETECTED NOT DETECTED Final   Salmonella species NOT DETECTED NOT DETECTED Final   Serratia marcescens NOT DETECTED NOT DETECTED Final   Haemophilus influenzae NOT DETECTED NOT DETECTED Final   Neisseria meningitidis NOT DETECTED NOT DETECTED Final   Pseudomonas aeruginosa NOT DETECTED NOT DETECTED Final   Stenotrophomonas maltophilia NOT DETECTED NOT DETECTED Final   Candida albicans NOT DETECTED NOT DETECTED Final   Candida auris NOT DETECTED NOT DETECTED Final   Candida glabrata NOT DETECTED NOT DETECTED Final   Candida krusei NOT DETECTED NOT DETECTED Final   Candida parapsilosis NOT DETECTED NOT DETECTED Final   Candida tropicalis NOT DETECTED NOT DETECTED Final   Cryptococcus neoformans/gattii NOT DETECTED NOT DETECTED Final   Methicillin resistance mecA/C DETECTED (A) NOT DETECTED Final    Comment: CRITICAL RESULT CALLED TO, READ BACK BY AND  VERIFIED WITH: PHARMD J.FRENS AT 0818 ON 12/09/2020 BY T.SAAD.    Meth resistant mecA/C and MREJ NOT DETECTED NOT DETECTED Final    Comment: Performed at [facility], [address] [place], [place] [address]  Resp Panel by RT-PCR (Flu A&B, Covid) Nasopharyngeal Swab     Status: None   Collection Time: 12/01/2020  5:38 PM   Specimen: Nasopharyngeal Swab; Nasopharyngeal(NP) swabs in vial transport medium  Result Value Ref Range Status   SARS Coronavirus 2 by RT PCR NEGATIVE NEGATIVE Final    Comment: (NOTE) SARS-CoV-2 target nucleic acids are NOT DETECTED.  The SARS-CoV-2 RNA is generally detectable in upper respiratory specimens during the acute phase of infection. The lowest concentration of SARS-CoV-2 viral copies this assay can detect is 138 copies/mL. A negative result does not preclude SARS-Cov-2 infection and should not be used as the sole basis for treatment or other patient management decisions. A negative result may occur with  improper specimen collection/handling, submission of specimen other than nasopharyngeal swab, presence of viral mutation(s) within the areas targeted by this assay, and inadequate number of viral copies(<138 copies/mL). A negative result must be combined with clinical observations, patient history, and epidemiological information. The expected result is Negative.  Fact Sheet for Patients:  BloggerCourse.com  Fact Sheet for Healthcare Providers:  SeriousBroker.it  This test is no t yet approved or cleared by the [place] FDA and  has been authorized for detection and/or diagnosis of SARS-CoV-2 by FDA under an Emergency Use Authorization (EUA). This EUA will remain  in effect (meaning this test can be used) for the duration of the COVID-19 declaration under Section 564(b)(1) of the Act, 21 U.S.C.section 360bbb-3(b)(1), unless the authorization is terminated  or revoked sooner.        Influenza A by PCR NEGATIVE NEGATIVE Final   Influenza B by PCR NEGATIVE NEGATIVE Final    Comment: (NOTE) The Xpert Xpress SARS-CoV-2/FLU/RSV plus assay is intended as an aid in the diagnosis of influenza from Nasopharyngeal  swab specimens and should not be used as a sole basis for treatment. Nasal washings and aspirates are unacceptable for Xpert Xpress SARS-CoV-2/FLU/RSV testing.  Fact Sheet for Patients: BloggerCourse.com  Fact Sheet for Healthcare Providers: SeriousBroker.it  This test is not yet approved or cleared by the [place] FDA and has been authorized for detection and/or diagnosis of SARS-CoV-2 by FDA under an Emergency Use Authorization (EUA). This EUA will remain in effect (meaning this test can be used) for the duration of the COVID-19 declaration under Section 564(b)(1) of the Act, 21 U.S.C. section 360bbb-3(b)(1), unless the authorization is terminated or revoked.  Performed at [facility], [address] [place], [place] [address]   MRSA Next Gen by PCR, Nasal     Status: None   Collection Time: December 27, 2020  7:45 PM   Specimen: Nasal Mucosa; Nasal Swab  Result Value Ref Range Status   MRSA by PCR Next Gen NOT DETECTED NOT DETECTED Final    Comment: (NOTE) The GeneXpert MRSA Assay (FDA approved for NASAL specimens only), is one component of a comprehensive MRSA colonization surveillance program. It is not intended to diagnose MRSA infection nor to guide or monitor treatment for MRSA infections. Test performance is not FDA approved in patients less than 66 years old. Performed at [facility], [address] [place], [place] [address]     Gardiner Barefoot, MD [facility] [email or web address] 12/09/2020, 9:13 AM

## 2020-12-09 NOTE — Procedures (Signed)
Cortrak  Person Inserting Tube:  Ilze Roselli C, RD Tube Type:  Cortrak - 43 inches Tube Size:  10 Tube Location:  Right nare Initial Placement:  Stomach Secured by: Bridle Technique Used to Measure Tube Placement:  Marking at nare/corner of mouth Cortrak Secured At:  68 cm  Cortrak Tube Team Note:  Consult received to place a Cortrak feeding tube.   X-ray is required, abdominal x-ray has been ordered by the Cortrak team. Please confirm tube placement before using the Cortrak tube.   If the tube becomes dislodged please keep the tube and contact the Cortrak team at [email or web address] (password [email or web address]) for replacement.  If after hours and replacement cannot be delayed, place a NG tube and confirm placement with an abdominal x-ray.    Anjeli Casad P., RD, LDN, CNSC See AMiON for contact information    

## 2020-12-09 NOTE — Progress Notes (Signed)
PHARMACY - PHYSICIAN COMMUNICATION CRITICAL VALUE ALERT - BLOOD CULTURE IDENTIFICATION (BCID)  Samuel [name] is an 70 y.o. male who presented to [facility] on 11/20/2020 with a chief complaint of DKA  Assessment:  Growing staph aureus and staph epi in 1 bottle (anaerobic), methicillin resistance mec A/C gene detected, MREJ not detected   Name of physician (or Provider) Contacted: Dr.Smith and ID team  Current antibiotics: Vancomycin and Cefepime  Changes to prescribed antibiotics recommended:  Patient is on recommended antibiotics - No changes needed  Results for orders placed or performed during the hospital encounter of 12/02/2020  Blood Culture ID Panel (Reflexed) (Collected: 11/22/2020  2:31 PM)  Result Value Ref Range   Enterococcus faecalis NOT DETECTED NOT DETECTED   Enterococcus Faecium NOT DETECTED NOT DETECTED   Listeria monocytogenes NOT DETECTED NOT DETECTED   Staphylococcus species DETECTED (A) NOT DETECTED   Staphylococcus aureus (BCID) DETECTED (A) NOT DETECTED   Staphylococcus epidermidis DETECTED (A) NOT DETECTED   Staphylococcus lugdunensis NOT DETECTED NOT DETECTED   Streptococcus species NOT DETECTED NOT DETECTED   Streptococcus agalactiae NOT DETECTED NOT DETECTED   Streptococcus pneumoniae NOT DETECTED NOT DETECTED   Streptococcus pyogenes NOT DETECTED NOT DETECTED   A.calcoaceticus-baumannii NOT DETECTED NOT DETECTED   Bacteroides fragilis NOT DETECTED NOT DETECTED   Enterobacterales NOT DETECTED NOT DETECTED   Enterobacter cloacae complex NOT DETECTED NOT DETECTED   Escherichia coli NOT DETECTED NOT DETECTED   Klebsiella aerogenes NOT DETECTED NOT DETECTED   Klebsiella oxytoca NOT DETECTED NOT DETECTED   Klebsiella pneumoniae NOT DETECTED NOT DETECTED   Proteus species NOT DETECTED NOT DETECTED   Salmonella species NOT DETECTED NOT DETECTED   Serratia marcescens NOT DETECTED NOT DETECTED   Haemophilus influenzae NOT DETECTED NOT DETECTED   Neisseria  meningitidis NOT DETECTED NOT DETECTED   Pseudomonas aeruginosa NOT DETECTED NOT DETECTED   Stenotrophomonas maltophilia NOT DETECTED NOT DETECTED   Candida albicans NOT DETECTED NOT DETECTED   Candida auris NOT DETECTED NOT DETECTED   Candida glabrata NOT DETECTED NOT DETECTED   Candida krusei NOT DETECTED NOT DETECTED   Candida parapsilosis NOT DETECTED NOT DETECTED   Candida tropicalis NOT DETECTED NOT DETECTED   Cryptococcus neoformans/gattii NOT DETECTED NOT DETECTED   Methicillin resistance mecA/C DETECTED (A) NOT DETECTED   Meth resistant mecA/C and MREJ NOT DETECTED NOT DETECTED    Tama Headings 12/09/2020  8:25 AM

## 2020-12-09 NOTE — Progress Notes (Addendum)
Pharmacy Antibiotic Note  Samuel [name] is a 71 y.o. male admitted on 12/13/2020 with suspected MSSA/MRSE bacteremia likely secondary to LE wounds.   GPC now growing in 1 bottle of each set. Pharmacy has been consulted for daptomycin dosing due to AKI. Scr currently 3.23 (Baseline is < 1). No current concern for pneumonia. Noted patient received a loading dose of vancomycin around 2100  last night - Will start Daptomycin tonight around 2000.   Plan: Daptomycin 850 mg every 48 hours ( ~ 8 mg/kg)  CK weekly - First level tomorrow AM Monitor renal function, blood cultures, clinical improvement   Height: 6\' 3"  (190.5 cm) Weight: 107 kg (235 lb 14.3 oz) IBW/kg (Calculated) : 84.5  Temp (24hrs), Avg:98.2 F (36.8 C), Min:91.4 F (33 C), Max:100.04 F (37.8 C)  Recent Labs  Lab 12/15/2020 1403 11/19/2020 1444 12/12/2020 2039 12/10/2020 2200 12/09/20 0200  WBC 10.2  --   --   --  8.2  CREATININE 4.01* 3.00* 0.45* 3.67* 3.23*  LATICACIDVEN 2.3*  --  0.8  --   --     Estimated Creatinine Clearance: 29.3 mL/min (A) (by C-G formula based on SCr of 3.23 mg/dL (H)).    Allergies  Allergen Reactions   Gabapentin Hives, Itching, Nausea And Vomiting and Rash    REACTION: rash/hives (per patient, was a reaction to an inactive ingredient in another mgf brand). The patient stated that he does take this medication now and it doesn't cause a rash any more. LOW DOSE   Relafen [Nabumetone] Nausea And Vomiting and Rash   Codeine Nausea And Vomiting and Rash      Thank you for allowing pharmacy to be a part of this patient's care.  12/11/20, PharmD, BCPS, BCIDP Infectious Diseases Clinical Pharmacist Phone: [phone] 12/09/2020 9:12 AM

## 2020-12-09 NOTE — Progress Notes (Signed)
ANTICOAGULATION CONSULT NOTE  Pharmacy Consult for heparin Indication: atrial fibrillation  Allergies  Allergen Reactions   Gabapentin Hives, Itching, Nausea And Vomiting and Rash    REACTION: rash/hives (per patient, was a reaction to an inactive ingredient in another mgf brand). The patient stated that he does take this medication now and it doesn't cause a rash any more. LOW DOSE   Relafen [Nabumetone] Nausea And Vomiting and Rash   Codeine Nausea And Vomiting and Rash    Patient Measurements: Height: 6\' 3"  (190.5 cm) Weight: 107 kg (235 lb 14.3 oz) IBW/kg (Calculated) : 84.5 Heparin Dosing Weight: 105kg  Vital Signs: Temp: 99.68 F (37.6 C) (06/24 0815) Temp Source: Bladder (06/24 0800) BP: 126/64 (06/24 0815) Pulse Rate: 98 (06/24 0815)  Labs: Recent Labs    12/04/2020 1403 11/18/2020 1444 11/17/2020 2039 12/02/2020 2200 12/09/20 0200 12/09/20 0640 12/09/20 0730  HGB 15.4 16.0  16.7  --   --  13.0 12.9*  --   HCT 45.6 47.0  49.0  --   --  37.7* 38.0*  --   PLT 246  --   --   --  230  --   --   APTT 28  --   --   --   --   --  137*  LABPROT 14.5  --   --   --   --   --   --   INR 1.1  --   --   --   --   --   --   HEPARINUNFRC  --   --  <0.10*  --   --   --  0.56  CREATININE 4.01* 3.00* 0.45* 3.67* 3.23*  --  2.68*     Estimated Creatinine Clearance: 35.4 mL/min (A) (by C-G formula based on SCr of 2.68 mg/dL (H)).   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 of 2012   Shortness of breath    exertion   Type II diabetes mellitus (HCC)     Medications:  Medications Prior to Admission  Medication Sig Dispense Refill Last Dose   acarbose (PRECOSE) 50 MG tablet Take 50 mg by mouth 3 (three) times daily with meals.   Past Week   alum & mag hydroxide-simeth (MAALOX/MYLANTA) 200-200-20 MG/5ML suspension Take 15 mLs by mouth every 6 (six) hours as needed for indigestion or heartburn. 355 mL 0 unknown   amLODipine (NORVASC) 10 MG tablet Take 1 tablet (10 mg total) by mouth daily. 30 tablet 2 Past Week   apixaban (ELIQUIS) 5 MG TABS tablet Take 5 mg by mouth 2 (two) times daily.   Past Week   aspirin 81 MG EC tablet Take 1 tablet (81 mg total) by mouth daily. 30 tablet 0 Past Week  atorvastatin (LIPITOR) 80 MG tablet Take 1 tablet (80 mg total) by mouth daily at 6 PM. (Patient taking differently: Take 80 mg by mouth daily.) 30 tablet 0 Past Week   clopidogrel (PLAVIX) 75 MG tablet Take 75 mg by mouth daily.   Past Week   diclofenac Sodium (VOLTAREN) 1 % GEL Rub a small grape sized dollop into the sore joints of hands up to 3 times daily as needed for pain. 150 g 1 unknown   docusate sodium (COLACE) 100 MG capsule Take 100 mg by mouth daily.   Past Week   furosemide (LASIX) 20 MG tablet Take 1 tablet (20 mg total) by mouth daily. 30 tablet 0 Past Week   gabapentin (NEURONTIN) 300 MG capsule TAKE 1 CAPSULE BY MOUTH 3 TIMES A DAY (Patient taking differently: Take 300 mg by mouth 3 (three) times daily.) 90 capsule 6 Past Week   insulin aspart (NOVOLOG FLEXPEN) 100 UNIT/ML FlexPen Inject 20 Units into the skin 3 (three) times daily with meals. 15 mL 11 Past Week   insulin degludec (TRESIBA FLEXTOUCH) 200 UNIT/ML FlexTouch Pen Inject 100 Units into the skin daily. 15 mL 11 Past Week   metoprolol tartrate (LOPRESSOR) 25 MG tablet Take 1 tablet (25 mg total) by mouth 2 (two) times daily. 60  tablet 0 Past Week   nitroGLYCERIN (NITROSTAT) 0.4 MG SL tablet Place 1 tablet (0.4 mg total) under the tongue every 5 (five) minutes as needed for chest pain. 30 tablet 0 unknown   ONETOUCH VERIO test strip Use to test blood sugar twice daily  4    pantoprazole (PROTONIX) 40 MG tablet Take 1 tablet (40 mg total) by mouth 2 (two) times daily before a meal. 60 tablet 0 Past Week   promethazine (PHENERGAN) 25 MG tablet TAKE 1 TO 2 TABLETS BY MOUTH EVERY 4 TO 6 HOURS AS NEEDED FOR NAUSEA (Patient taking differently: Take 25-50 mg by mouth every 6 (six) hours as needed for nausea.) 240 tablet 1 unknown   traMADol (ULTRAM) 50 MG tablet Take 2 tablets (100 mg total) by mouth every 6 (six) hours as needed for moderate pain. 30 tablet 1 unknown   promethazine (PHENERGAN) 25 MG/ML injection Inject 0.5 mLs (12.5 mg total) into the muscle every 6 (six) hours as needed for nausea or vomiting. (Patient not taking: No sig reported) 25 mL 11 Not Taking   ranolazine (RANEXA) 500 MG 12 hr tablet Take 1 tablet (500 mg total) by mouth 2 (two) times daily. (Patient not taking: No sig reported) 60 tablet 0 Not Taking   Scheduled:   Chlorhexidine Gluconate Cloth  6 each Topical Daily   clotrimazole   Topical BID   pantoprazole (PROTONIX) IV  40 mg Intravenous Q24H   Infusions:   amiodarone 30 mg/hr (12/09/20 0800)   DAPTOmycin (CUBICIN)  IV     dextrose 5% lactated ringers     heparin 1,550 Units/hr (12/09/20 0800)   insulin 6.5 Units/hr (12/09/20 0800)   norepinephrine (LEVOPHED) Adult infusion 4 mcg/min (12/09/20 0800)    Assessment: Pt was admitted for DKA. He has been on apixaban PTA for a hx PE? Last dose in the past week? Heparin has been ordered here for new afib. Baseline PTT 28, HL undetectable.   Initial heparin level therapeutic at 0.56, on drip rate 1550 units/hr. CBC wnl. No overt bleeding or infusion issues noted. Given HL undetectable at baseline, likely not affected by PTA Eliquis given  noncompliance - will dose based off HL  and not aPTT.   Goal of Therapy:  Heparin level 0.3-0.7 units/ml Monitor platelets by anticoagulation protocol: Yes   Plan:  Continue heparin infusion at 1550 units/hr Check 8hr cHL Monitor daily HL, CBC, s/sx bleeding  Tama Headings, PharmD, BCPS PGY2 Cardiology Pharmacy Resident Phone: [phone] 12/09/2020  9:36 AM  Please check [email or web address] for unit-specific pharmacy phone numbers.

## 2020-12-09 NOTE — Procedures (Signed)
Central Venous Catheter Insertion Procedure Note  AVRY ROEDL  [id number]  Aug 05, 1950  Date:12/09/20  Time:12:44 PM   Provider Performing:Jelan Batterton   Procedure: Insertion of Non-tunneled Central Venous Catheter(36556) without US guidance  Indication(s) Difficult access  Consent Unable to obtain consent due to emergent nature of procedure.  Anesthesia Topical only with 1% lidocaine   Timeout Verified patient identification, verified procedure, site/side was marked, verified correct patient position, special equipment/implants available, medications/allergies/relevant history reviewed, required imaging and test results available.  Sterile Technique Maximal sterile technique including full sterile barrier drape, hand hygiene, sterile gown, sterile gloves, mask, hair covering, sterile ultrasound probe cover (if used).  Procedure Description Area of catheter insertion was cleaned with chlorhexidine and draped in sterile fashion.  Without real-time ultrasound guidance a central venous catheter was placed into the left subclavian vein. Nonpulsatile blood flow and easy flushing noted in all ports.  The catheter was sutured in place and sterile dressing applied.  Complications/Tolerance None; patient tolerated the procedure well. Chest X-ray is ordered to verify placement for internal jugular or subclavian cannulation.   Chest x-ray is not ordered for femoral cannulation.  EBL none  Lynnell Catalan, MD [facility] ICU Physician [facility] [place] [facility]  Pager: [phone] Or Epic Secure Chat After hours: [phone].  12/09/2020, 12:45 PM

## 2020-12-10 ENCOUNTER — Inpatient Hospital Stay (HOSPITAL_COMMUNITY): Payer: HMO

## 2020-12-10 DIAGNOSIS — R6521 Severe sepsis with septic shock: Secondary | ICD-10-CM

## 2020-12-10 DIAGNOSIS — R7881 Bacteremia: Secondary | ICD-10-CM

## 2020-12-10 DIAGNOSIS — J9601 Acute respiratory failure with hypoxia: Secondary | ICD-10-CM

## 2020-12-10 DIAGNOSIS — A4101 Sepsis due to Methicillin susceptible Staphylococcus aureus: Principal | ICD-10-CM

## 2020-12-10 DIAGNOSIS — L8990 Pressure ulcer of unspecified site, unspecified stage: Secondary | ICD-10-CM | POA: Insufficient documentation

## 2020-12-10 LAB — BASIC METABOLIC PANEL
Anion gap: 8 (ref 5–15)
BUN: 55 mg/dL — ABNORMAL HIGH (ref 8–23)
CO2: 24 mmol/L (ref 22–32)
Calcium: 7.7 mg/dL — ABNORMAL LOW (ref 8.9–10.3)
Chloride: 108 mmol/L (ref 98–111)
Creatinine, Ser: 1.18 mg/dL (ref 0.61–1.24)
GFR, Estimated: >60 mL/min (ref >60)
Glucose, Bld: 215 mg/dL — ABNORMAL HIGH (ref 70–99)
Potassium: 3.1 mmol/L — ABNORMAL LOW (ref 3.5–5.1)
Sodium: 140 mmol/L (ref 135–145)

## 2020-12-10 LAB — GLUCOSE, CAPILLARY
Glucose-Capillary: 169 mg/dL — ABNORMAL HIGH (ref 70–99)
Glucose-Capillary: 193 mg/dL — ABNORMAL HIGH (ref 70–99)
Glucose-Capillary: 202 mg/dL — ABNORMAL HIGH (ref 70–99)
Glucose-Capillary: 195 mg/dL — ABNORMAL HIGH (ref 70–99)
Glucose-Capillary: 191 mg/dL — ABNORMAL HIGH (ref 70–99)
Glucose-Capillary: 208 mg/dL — ABNORMAL HIGH (ref 70–99)
Glucose-Capillary: 182 mg/dL — ABNORMAL HIGH (ref 70–99)
Glucose-Capillary: 179 mg/dL — ABNORMAL HIGH (ref 70–99)
Glucose-Capillary: 168 mg/dL — ABNORMAL HIGH (ref 70–99)
Glucose-Capillary: 182 mg/dL — ABNORMAL HIGH (ref 70–99)
Glucose-Capillary: 193 mg/dL — ABNORMAL HIGH (ref 70–99)
Glucose-Capillary: 216 mg/dL — ABNORMAL HIGH (ref 70–99)
Glucose-Capillary: 278 mg/dL — ABNORMAL HIGH (ref 70–99)
Glucose-Capillary: 276 mg/dL — ABNORMAL HIGH (ref 70–99)
Glucose-Capillary: 288 mg/dL — ABNORMAL HIGH (ref 70–99)
Glucose-Capillary: 256 mg/dL — ABNORMAL HIGH (ref 70–99)

## 2020-12-10 LAB — POCT I-STAT 7, (LYTES, BLD GAS, ICA,H+H)
Acid-base deficit: 2 mmol/L (ref 0.0–2.0)
Bicarbonate: 22.9 mmol/L (ref 20.0–28.0)
Calcium, Ion: 1.18 mmol/L (ref 1.15–1.40)
HCT: 36 % — ABNORMAL LOW (ref 39.0–52.0)
Hemoglobin: 12.2 g/dL — ABNORMAL LOW (ref 13.0–17.0)
O2 Saturation: 100 %
Patient temperature: 37.3
Potassium: 3.7 mmol/L (ref 3.5–5.1)
Sodium: 141 mmol/L (ref 135–145)
TCO2: 24 mmol/L (ref 22–32)
pCO2 arterial: 41.4 mmHg (ref 32.0–48.0)
pH, Arterial: 7.353 (ref 7.350–7.450)
pO2, Arterial: 316 mmHg — ABNORMAL HIGH (ref 83.0–108.0)

## 2020-12-10 LAB — PROCALCITONIN: Procalcitonin: 1.09 ng/mL

## 2020-12-10 LAB — HEPARIN LEVEL (UNFRACTIONATED)
Heparin Unfractionated: 0.68 IU/mL (ref 0.30–0.70)
Heparin Unfractionated: 0.56 IU/mL (ref 0.30–0.70)

## 2020-12-10 LAB — CK: Total CK: 992 U/L — ABNORMAL HIGH (ref 49–397)

## 2020-12-10 MED ORDER — FENTANYL BOLUS VIA INFUSION
25.0000 ug | INTRAVENOUS | Status: DC | PRN
  Administered 2020-12-14: 25 ug via INTRAVENOUS
  Administered 2020-12-15: 50 ug via INTRAVENOUS
  Administered 2020-12-15 (×2): 25 ug via INTRAVENOUS
  Administered 2020-12-16 – 2020-12-17 (×6): 50 ug via INTRAVENOUS
  Filled 2020-12-10: qty 100

## 2020-12-10 MED ORDER — FENTANYL 2500MCG IN NS 250ML (10MCG/ML) PREMIX INFUSION
0.0000 ug/h | INTRAVENOUS | Status: DC
Start: 2020-12-10 — End: 2020-12-10
  Administered 2020-12-10: 25 ug/h via INTRAVENOUS
  Filled 2020-12-10: qty 250

## 2020-12-10 MED ORDER — ETOMIDATE 2 MG/ML IV SOLN
INTRAVENOUS | Status: AC
  Administered 2020-12-10: 30 mg via INTRAVENOUS
  Filled 2020-12-10: qty 20

## 2020-12-10 MED ORDER — AMIODARONE HCL 200 MG PO TABS
200.0000 mg | ORAL_TABLET | Freq: Two times a day (BID) | ORAL | Status: DC
  Administered 2020-12-10 (×2): 200 mg via ORAL
  Filled 2020-12-10 (×2): qty 1

## 2020-12-10 MED ORDER — PHENYLEPHRINE 40 MCG/ML (10ML) SYRINGE FOR IV PUSH (FOR BLOOD PRESSURE SUPPORT)
PREFILLED_SYRINGE | INTRAVENOUS | Status: AC
  Administered 2020-12-10: 400 ug
  Filled 2020-12-10: qty 10

## 2020-12-10 MED ORDER — SODIUM CHLORIDE 0.9% FLUSH
10.0000 mL | Freq: Two times a day (BID) | INTRAVENOUS | Status: DC
  Administered 2020-12-10 – 2020-12-13 (×9): 10 mL
  Administered 2020-12-14: 30 mL
  Administered 2020-12-14: 10 mL
  Administered 2020-12-15: 30 mL
  Administered 2020-12-15 – 2020-12-17 (×5): 10 mL

## 2020-12-10 MED ORDER — FENTANYL 2500MCG IN NS 250ML (10MCG/ML) PREMIX INFUSION
25.0000 ug/h | INTRAVENOUS | Status: DC
  Administered 2020-12-10: 200 ug/h via INTRAVENOUS
  Administered 2020-12-11: 150 ug/h via INTRAVENOUS
  Administered 2020-12-12: 25 ug/h via INTRAVENOUS
  Administered 2020-12-13: 175 ug/h via INTRAVENOUS
  Administered 2020-12-13 – 2020-12-14 (×2): 150 ug/h via INTRAVENOUS
  Administered 2020-12-15: 75 ug/h via INTRAVENOUS
  Administered 2020-12-15: 25 ug/h via INTRAVENOUS
  Administered 2020-12-17: 75 ug/h via INTRAVENOUS
  Filled 2020-12-10 (×8): qty 250

## 2020-12-10 MED ORDER — INSULIN GLARGINE 100 UNIT/ML ~~LOC~~ SOLN
35.0000 [IU] | Freq: Two times a day (BID) | SUBCUTANEOUS | Status: DC
  Administered 2020-12-10 (×2): 35 [IU] via SUBCUTANEOUS
  Filled 2020-12-10 (×4): qty 0.35

## 2020-12-10 MED ORDER — POTASSIUM CHLORIDE 20 MEQ PO PACK
40.0000 meq | PACK | ORAL | Status: AC
  Administered 2020-12-10 (×2): 40 meq
  Filled 2020-12-10 (×2): qty 2

## 2020-12-10 MED ORDER — OSMOLITE 1.2 CAL PO LIQD
1000.0000 mL | ORAL | Status: DC
  Administered 2020-12-10 – 2020-12-12 (×3): 1000 mL
  Filled 2020-12-10 (×4): qty 1000

## 2020-12-10 MED ORDER — INSULIN ASPART 100 UNIT/ML IJ SOLN
0.0000 [IU] | INTRAMUSCULAR | Status: DC
  Administered 2020-12-10: 7 [IU] via SUBCUTANEOUS
  Administered 2020-12-10: 11 [IU] via SUBCUTANEOUS
  Administered 2020-12-11 (×2): 4 [IU] via SUBCUTANEOUS
  Administered 2020-12-11: 3 [IU] via SUBCUTANEOUS
  Administered 2020-12-11 (×2): 4 [IU] via SUBCUTANEOUS
  Administered 2020-12-11: 7 [IU] via SUBCUTANEOUS
  Administered 2020-12-11: 11 [IU] via SUBCUTANEOUS
  Administered 2020-12-12: 4 [IU] via SUBCUTANEOUS
  Administered 2020-12-12: 7 [IU] via SUBCUTANEOUS
  Administered 2020-12-12 (×2): 3 [IU] via SUBCUTANEOUS
  Administered 2020-12-12: 4 [IU] via SUBCUTANEOUS
  Administered 2020-12-13 – 2020-12-14 (×5): 3 [IU] via SUBCUTANEOUS
  Administered 2020-12-15: 4 [IU] via SUBCUTANEOUS
  Administered 2020-12-15 (×2): 3 [IU] via SUBCUTANEOUS
  Administered 2020-12-15: 4 [IU] via SUBCUTANEOUS
  Administered 2020-12-15 – 2020-12-16 (×3): 3 [IU] via SUBCUTANEOUS
  Administered 2020-12-16: 4 [IU] via SUBCUTANEOUS
  Administered 2020-12-17 (×3): 3 [IU] via SUBCUTANEOUS
  Administered 2020-12-17: 4 [IU] via SUBCUTANEOUS
  Administered 2020-12-17: 3 [IU] via SUBCUTANEOUS

## 2020-12-10 MED ORDER — DEXMEDETOMIDINE HCL IN NACL 400 MCG/100ML IV SOLN
0.0000 ug/kg/h | INTRAVENOUS | Status: AC
  Administered 2020-12-10: 0.2 ug/kg/h via INTRAVENOUS
  Filled 2020-12-10: qty 100

## 2020-12-10 MED ORDER — FENTANYL CITRATE (PF) 100 MCG/2ML IJ SOLN: 100.0000 ug | Freq: Once | INTRAMUSCULAR | Status: AC

## 2020-12-10 MED ORDER — ETOMIDATE 2 MG/ML IV SOLN: 50.0000 mg | Freq: Once | INTRAVENOUS | Status: DC

## 2020-12-10 MED ORDER — LACTATED RINGERS IV SOLN: INTRAVENOUS | Status: DC

## 2020-12-10 MED ORDER — ROCURONIUM BROMIDE 10 MG/ML (PF) SYRINGE
PREFILLED_SYRINGE | INTRAVENOUS | Status: AC
  Administered 2020-12-10: 100 mg via INTRAVENOUS
  Filled 2020-12-10: qty 10

## 2020-12-10 MED ORDER — SODIUM CHLORIDE 0.9% FLUSH: 10.0000 mL | INTRAVENOUS | Status: DC | PRN

## 2020-12-10 MED ORDER — POLYETHYLENE GLYCOL 3350 17 G PO PACK
17.0000 g | PACK | Freq: Every day | ORAL | Status: DC
  Administered 2020-12-11 – 2020-12-17 (×5): 17 g
  Filled 2020-12-10 (×5): qty 1

## 2020-12-10 MED ORDER — FENTANYL CITRATE (PF) 100 MCG/2ML IJ SOLN
25.0000 ug | Freq: Once | INTRAMUSCULAR | Status: DC
Start: 2020-12-10 — End: 2020-12-14

## 2020-12-10 MED ORDER — ACETAMINOPHEN 160 MG/5ML PO SOLN
500.0000 mg | Freq: Once | ORAL | Status: AC | PRN
  Administered 2020-12-10: 500 mg
  Filled 2020-12-10: qty 20.3

## 2020-12-10 MED ORDER — DOCUSATE SODIUM 50 MG/5ML PO LIQD
100.0000 mg | Freq: Two times a day (BID) | ORAL | Status: DC
  Administered 2020-12-10 – 2020-12-17 (×10): 100 mg
  Filled 2020-12-10 (×11): qty 10

## 2020-12-10 MED ORDER — FENTANYL CITRATE (PF) 100 MCG/2ML IJ SOLN
INTRAMUSCULAR | Status: AC
  Administered 2020-12-10: 50 ug via INTRAVENOUS
  Filled 2020-12-10: qty 2

## 2020-12-10 MED ORDER — AMIODARONE HCL 200 MG PO TABS
200.0000 mg | ORAL_TABLET | Freq: Two times a day (BID) | ORAL | Status: AC
  Administered 2020-12-11 – 2020-12-16 (×11): 200 mg
  Filled 2020-12-10 (×11): qty 1

## 2020-12-10 MED ORDER — ROCURONIUM BROMIDE 50 MG/5ML IV SOLN
100.0000 mg | Freq: Once | INTRAVENOUS | Status: AC
  Filled 2020-12-10: qty 10

## 2020-12-10 MED ORDER — ETOMIDATE 2 MG/ML IV SOLN: 30.0000 mg | Freq: Once | INTRAVENOUS | Status: AC

## 2020-12-10 MED ORDER — INSULIN ASPART 100 UNIT/ML IJ SOLN
8.0000 [IU] | INTRAMUSCULAR | Status: DC
  Administered 2020-12-10 – 2020-12-11 (×4): 8 [IU] via SUBCUTANEOUS

## 2020-12-10 NOTE — Progress Notes (Signed)
ETT advanced 2cm per MD order.  ETT now at 26 at the lips.

## 2020-12-10 NOTE — Progress Notes (Signed)
No answer on both of Floyd's numbers. Called cousin Baldwin Crown [phone] and provided update on patient condition, told her I would call if any issues or further deterioration.  Myrla Halsted MD PCCM

## 2020-12-10 NOTE — Progress Notes (Signed)
[facility]  Date of Admission:  11/22/2020     CC: Septic shock Staph aureus bacteremia  Lines: 6/23-c left subclavian cvc 6/23-c foley peripheral  Abx: 6/24-c dapto  6/23-24 vanc/cefepime/flagyl  ASSESSMENT: Community acquired staph aureus bacteremia Septic shock Metabolic encephalopathy Aki Respiratory failure s/p intubation 6/24   Intubated 6/24 for airway protection Off pressors but fluctuating mentation still Repeat bcx 6/25 in progress Admission bcx pending susceptibility  Daptomycin started 6/24; cpk a little high; on ivf Tte so far no obvious vegetation but will need tee and further investigation of metastatic foci on involvement once he is more awake  PLAN: Continue dapto Continue ivf and daily trend of cpk until improvement  F/u blood cx Once more alert will investigate for other point of tenderness/metastatic foci of infection TEE next week when able to help define abx duration  I spent more than 35 minute reviewing data/chart, and coordinating care and >50% direct face to face time providing counseling/discussing diagnostics/treatment plan with patient  Active Problems:   DKA (diabetic ketoacidosis) (HCC)   Acute respiratory failure with hypoxia (HCC)   Pressure injury of skin   Allergies  Allergen Reactions   Gabapentin Hives, Itching, Nausea And Vomiting and Rash    REACTION: rash/hives (per patient, was a reaction to an inactive ingredient in another mgf brand). The patient stated that he does take this medication now and it doesn't cause a rash any more. LOW DOSE   Relafen [Nabumetone] Nausea And Vomiting and Rash   Codeine Nausea And Vomiting and Rash    Scheduled Meds:  amiodarone  200 mg Oral BID   Chlorhexidine Gluconate Cloth  6 each Topical Daily   clotrimazole   Topical BID   feeding supplement (PROSource TF)  45 mL Per Tube Daily   insulin aspart  0-20 Units Subcutaneous Q4H   insulin  aspart  8 Units Subcutaneous Q4H   insulin glargine  35 Units Subcutaneous BID   pantoprazole (PROTONIX) IV  40 mg Intravenous Q24H   potassium chloride  40 mEq Per Tube Q4H   sodium chloride flush  10-40 mL Intracatheter Q12H   Continuous Infusions:  DAPTOmycin (CUBICIN)  IV Stopped (12/09/20 2336)   feeding supplement (OSMOLITE 1.2 CAL) 1,000 mL (12/10/20 0923)   fentaNYL infusion INTRAVENOUS 200 mcg/hr (12/10/20 1000)   heparin 1,500 Units/hr (12/10/20 1000)   lactated ringers 75 mL/hr at 12/10/20 1000   norepinephrine (LEVOPHED) Adult infusion Stopped (12/10/20 0100)   PRN Meds:.dextrose, polyethylene glycol, sodium chloride flush   SUBJECTIVE: Patient intubated last night due to increased secretion and waxing/waning mentation unable to protect airway Off pressor yesterday Afebrile Cr down trending Ck up on ivf in setting daptomycin use No new rash  Bcx sensitivity still in progress  Review of Systems: ROS All other ROS was negative, except mentioned above     OBJECTIVE: Vitals:   12/10/20 1000 12/10/20 1100 12/10/20 1158 12/10/20 1200  BP: (!) 117/53 (!) 106/52 (!) 117/57 (!) 111/57  Pulse: 94 94 93 96  Resp: 13 12 (!) 110 13  Temp: 98.96 F (37.2 C) 99.14 F (37.3 C)  99.32 F (37.4 C)  TempSrc:    Bladder  SpO2: 98% 97%  98%  Weight:      Height:       Body mass index is 28.88 kg/m.  Physical Exam Intubated/sedated No pressor Normocephalic; per; conj clear; ett intact Cv rrr no mrg Lungs clear on  vent Abd soft Ext s/p left bka; right s/p partial ray; no obvious infectious lesion in RLE, but is dry and scatterred skin barrier compromise Neuro/psych deferred as intubated/sedated Msk no joint swelling  Left chest cvc site no erythema/purulence/swelling  Lab Results Lab Results  Component Value Date   WBC 8.2 12/09/2020   HGB 12.2 (L) 12/10/2020   HCT 36.0 (L) 12/10/2020   MCV 86.5 12/09/2020   PLT 230 12/09/2020    Lab Results  Component  Value Date   CREATININE 1.18 12/10/2020   BUN 55 (H) 12/10/2020   NA 140 12/10/2020   K 3.1 (L) 12/10/2020   CL 108 12/10/2020   CO2 24 12/10/2020    Lab Results  Component Value Date   ALT 18 11/26/2020   AST 29 11/30/2020   ALKPHOS 72 11/25/2020   BILITOT 3.9 (H) 11/30/2020      Microbiology: Recent Results (from the past 240 hour(s))  Culture, blood (Routine x 2)     Status: None (Preliminary result)   Collection Time: 12/09/2020  2:03 PM   Specimen: BLOOD  Result Value Ref Range Status   Specimen Description BLOOD LEFT ANTECUBITAL  Final   Special Requests   Final    BOTTLES DRAWN AEROBIC AND ANAEROBIC Blood Culture results may not be optimal due to an inadequate volume of blood received in culture bottles   Culture  Setup Time   Final    GRAM POSITIVE COCCI IN CLUSTERS AEROBIC BOTTLE ONLY CRITICAL VALUE NOTED.  VALUE IS CONSISTENT WITH PREVIOUSLY REPORTED AND CALLED VALUE.    Culture   Final    GRAM POSITIVE COCCI IDENTIFICATION TO FOLLOW CULTURE REINCUBATED FOR BETTER GROWTH Performed at [facility], [address] [place], [place] [address]    Report Status PENDING  Incomplete  Culture, blood (Routine x 2)     Status: Abnormal (Preliminary result)   Collection Time: 11/25/2020  2:31 PM   Specimen: BLOOD  Result Value Ref Range Status   Specimen Description BLOOD LEFT ANTECUBITAL  Final   Special Requests   Final    BOTTLES DRAWN AEROBIC AND ANAEROBIC Blood Culture adequate volume   Culture  Setup Time   Final    GRAM POSITIVE COCCI IN CLUSTERS IN BOTH AEROBIC AND ANAEROBIC BOTTLES CRITICAL RESULT CALLED TO, READ BACK BY AND VERIFIED WITH: PHARMD J.FRENS AT 0818 ON 12/09/2020 BY T.SAAD. Performed at [facility], [address] [place], [place] [address]    Culture (A)  Final    STAPHYLOCOCCUS AUREUS SUSCEPTIBILITIES TO FOLLOW STAPHYLOCOCCUS EPIDERMIDIS    Report Status PENDING  Incomplete  Blood Culture ID Panel (Reflexed)     Status:  Abnormal   Collection Time: 11/29/2020  2:31 PM  Result Value Ref Range Status   Enterococcus faecalis NOT DETECTED NOT DETECTED Final   Enterococcus Faecium NOT DETECTED NOT DETECTED Final   Listeria monocytogenes NOT DETECTED NOT DETECTED Final   Staphylococcus species DETECTED (A) NOT DETECTED Final    Comment: CRITICAL RESULT CALLED TO, READ BACK BY AND VERIFIED WITH: PHARMD J.FRENS AT 0818 ON 12/09/2020 BY T.SAAD.    Staphylococcus aureus (BCID) DETECTED (A) NOT DETECTED Final    Comment: CRITICAL RESULT CALLED TO, READ BACK BY AND VERIFIED WITH: PHARMD J.FRENS AT 0818 ON 12/09/2020 BY T.SAAD.    Staphylococcus epidermidis DETECTED (A) NOT DETECTED Final    Comment: CRITICAL RESULT CALLED TO, READ BACK BY AND VERIFIED WITH: PHARMD J.FRENS AT 0818 ON 12/09/2020 BY T.SAAD.  Staphylococcus lugdunensis NOT DETECTED NOT DETECTED Final   Streptococcus species NOT DETECTED NOT DETECTED Final   Streptococcus agalactiae NOT DETECTED NOT DETECTED Final   Streptococcus pneumoniae NOT DETECTED NOT DETECTED Final   Streptococcus pyogenes NOT DETECTED NOT DETECTED Final   A.calcoaceticus-baumannii NOT DETECTED NOT DETECTED Final   Bacteroides fragilis NOT DETECTED NOT DETECTED Final   Enterobacterales NOT DETECTED NOT DETECTED Final   Enterobacter cloacae complex NOT DETECTED NOT DETECTED Final   Escherichia coli NOT DETECTED NOT DETECTED Final   Klebsiella aerogenes NOT DETECTED NOT DETECTED Final   Klebsiella oxytoca NOT DETECTED NOT DETECTED Final   Klebsiella pneumoniae NOT DETECTED NOT DETECTED Final   Proteus species NOT DETECTED NOT DETECTED Final   Salmonella species NOT DETECTED NOT DETECTED Final   Serratia marcescens NOT DETECTED NOT DETECTED Final   Haemophilus influenzae NOT DETECTED NOT DETECTED Final   Neisseria meningitidis NOT DETECTED NOT DETECTED Final   Pseudomonas aeruginosa NOT DETECTED NOT DETECTED Final   Stenotrophomonas maltophilia NOT DETECTED NOT DETECTED Final    Candida albicans NOT DETECTED NOT DETECTED Final   Candida auris NOT DETECTED NOT DETECTED Final   Candida glabrata NOT DETECTED NOT DETECTED Final   Candida krusei NOT DETECTED NOT DETECTED Final   Candida parapsilosis NOT DETECTED NOT DETECTED Final   Candida tropicalis NOT DETECTED NOT DETECTED Final   Cryptococcus neoformans/gattii NOT DETECTED NOT DETECTED Final   Methicillin resistance mecA/C DETECTED (A) NOT DETECTED Final    Comment: CRITICAL RESULT CALLED TO, READ BACK BY AND VERIFIED WITH: PHARMD J.FRENS AT 0818 ON 12/09/2020 BY T.SAAD.    Meth resistant mecA/C and MREJ NOT DETECTED NOT DETECTED Final    Comment: Performed at [facility], [address], [place], [place] [address]  Resp Panel by RT-PCR (Flu A&B, Covid) Nasopharyngeal Swab     Status: None   Collection Time: 11/20/2020  5:38 PM   Specimen: Nasopharyngeal Swab; Nasopharyngeal(NP) swabs in vial transport medium  Result Value Ref Range Status   SARS Coronavirus 2 by RT PCR NEGATIVE NEGATIVE Final    Comment: (NOTE) SARS-CoV-2 target nucleic acids are NOT DETECTED.  The SARS-CoV-2 RNA is generally detectable in upper respiratory specimens during the acute phase of infection. The lowest concentration of SARS-CoV-2 viral copies this assay can detect is 138 copies/mL. A negative result does not preclude SARS-Cov-2 infection and should not be used as the sole basis for treatment or other patient management decisions. A negative result may occur with  improper specimen collection/handling, submission of specimen other than nasopharyngeal swab, presence of viral mutation(s) within the areas targeted by this assay, and inadequate number of viral copies(<138 copies/mL). A negative result must be combined with clinical observations, patient history, and epidemiological information. The expected result is Negative.  Fact Sheet for Patients:  BloggerCourse.com  Fact Sheet for Healthcare  Providers:  SeriousBroker.it  This test is no t yet approved or cleared by the [place] FDA and  has been authorized for detection and/or diagnosis of SARS-CoV-2 by FDA under an Emergency Use Authorization (EUA). This EUA will remain  in effect (meaning this test can be used) for the duration of the COVID-19 declaration under Section 564(b)(1) of the Act, 21 U.S.C.section 360bbb-3(b)(1), unless the authorization is terminated  or revoked sooner.       Influenza A by PCR NEGATIVE NEGATIVE Final   Influenza B by PCR NEGATIVE NEGATIVE Final    Comment: (NOTE) The Xpert Xpress SARS-CoV-2/FLU/RSV plus assay is intended as  an aid in the diagnosis of influenza from Nasopharyngeal swab specimens and should not be used as a sole basis for treatment. Nasal washings and aspirates are unacceptable for Xpert Xpress SARS-CoV-2/FLU/RSV testing.  Fact Sheet for Patients: BloggerCourse.com  Fact Sheet for Healthcare Providers: SeriousBroker.it  This test is not yet approved or cleared by the [place] FDA and has been authorized for detection and/or diagnosis of SARS-CoV-2 by FDA under an Emergency Use Authorization (EUA). This EUA will remain in effect (meaning this test can be used) for the duration of the COVID-19 declaration under Section 564(b)(1) of the Act, 21 U.S.C. section 360bbb-3(b)(1), unless the authorization is terminated or revoked.  Performed at [facility], [address], [place], [place] [address]   MRSA Next Gen by PCR, Nasal     Status: None   Collection Time: 12/09/2020  7:45 PM   Specimen: Nasal Mucosa; Nasal Swab  Result Value Ref Range Status   MRSA by PCR Next Gen NOT DETECTED NOT DETECTED Final    Comment: (NOTE) The GeneXpert MRSA Assay (FDA approved for NASAL specimens only), is one component of a comprehensive MRSA colonization surveillance program. It is not intended to  diagnose MRSA infection nor to guide or monitor treatment for MRSA infections. Test performance is not FDA approved in patients less than 2 years old. Performed at [facility], [address], [place], [place] [address]      Serology:   Imaging: If present, new imagings (plain films, ct scans, and mri) have been personally visualized and interpreted; radiology reports have been reviewed. Decision making incorporated into the Impression / Recommendations.  6/24 tte  1. Left ventricular ejection fraction, by estimation, is 70 to 75%. The  left ventricle has hyperdynamic function. The left ventricle has no  regional wall motion abnormalities. The left ventricular internal cavity  size was mildly dilated. There is mild  left ventricular hypertrophy. Left ventricular diastolic parameters are  consistent with Grade I diastolic dysfunction (impaired relaxation).  Elevated left atrial pressure.   2. Right ventricular systolic function is normal. The right ventricular  size is normal.   3. The mitral valve is normal in structure. Trivial mitral valve  regurgitation. No evidence of mitral stenosis.   4. The aortic valve is tricuspid. Aortic valve regurgitation is not  visualized. No aortic stenosis is present.   5. Aortic dilatation noted. There is mild dilatation of the aortic root,  measuring 39 mm.   Raymondo Band, MD [facility] [phone] pager    12/10/2020, 12:42 PM

## 2020-12-10 NOTE — Progress Notes (Addendum)
eLink Physician-Brief Progress Note Patient Name: Samuel [name] DOB: 01-Aug-1950 MRN: [id number]   Date of Service  12/10/2020  HPI/Events of Note  S/p Intubation status. CxR film reviewed. ET at clavicular heads.   eICU Interventions  RT: Push down ET tube by 2 cms at 26 at lip.  Follow ABG      Intervention Category Intermediate Interventions: Diagnostic test evaluation  Ranee Gosselin 12/10/2020, 1:29 AM  2;30 ABG reviewed.  Wean down fio2 as tolerated. At 40%.  Starting to wake up, pulling his lines.   Restraints ordered. On fentanyl gtt.

## 2020-12-10 NOTE — Progress Notes (Signed)
ANTICOAGULATION CONSULT NOTE  Pharmacy Consult for heparin Indication: atrial fibrillation  Allergies  Allergen Reactions   Gabapentin Hives, Itching, Nausea And Vomiting and Rash    REACTION: rash/hives (per patient, was a reaction to an inactive ingredient in another mgf brand). The patient stated that he does take this medication now and it doesn't cause a rash any more. LOW DOSE   Relafen [Nabumetone] Nausea And Vomiting and Rash   Codeine Nausea And Vomiting and Rash    Patient Measurements: Height: 6\' 3"  (190.5 cm) Weight: 104.8 kg (231 lb 0.7 oz) IBW/kg (Calculated) : 84.5 Heparin Dosing Weight: 105kg  Vital Signs: Temp: 98.06 F (36.7 C) (06/25 0645) BP: 108/55 (06/25 0813) Pulse Rate: 92 (06/25 0813)  Labs: Recent Labs    11/17/2020 1403 11/20/2020 1444 12/09/20 0200 12/09/20 0640 12/09/20 0730 12/09/20 1512 12/09/20 1700 12/09/20 2158 12/09/20 2308 12/09/20 2341 12/10/20 0159 12/10/20 0359 12/10/20 0754  HGB 15.4   < > 13.0 12.9*  --   --   --   --   --  11.6* 12.2*  --   --   HCT 45.6   < > 37.7* 38.0*  --   --   --   --   --  34.0* 36.0*  --   --   PLT 246  --  230  --   --   --   --   --   --   --   --   --   --   APTT 28  --   --   --  137*  --   --   --  127*  --   --   --   --   LABPROT 14.5  --   --   --   --   --   --   --   --   --   --   --   --   INR 1.1  --   --   --   --   --   --   --   --   --   --   --   --   HEPARINUNFRC  --    < >  --   --  0.56  --  0.44  --   --   --   --  0.68  --   CREATININE 4.01*   < > 3.23*  --  2.68* 1.90*  --  1.53*  --   --   --   --  1.18  CKTOTAL  --   --   --   --   --   --   --   --   --   --   --  992*  --    < > = values in this interval not displayed.     Estimated Creatinine Clearance: 79.6 mL/min (by C-G formula based on SCr of 1.18 mg/dL).   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 of 2012   Shortness of breath    exertion   Type II diabetes mellitus (HCC)     Medications:  Medications Prior to Admission  Medication Sig Dispense Refill Last Dose   acarbose (PRECOSE) 50 MG tablet Take 50 mg by mouth 3 (three) times daily with meals.   Past Week   alum & mag hydroxide-simeth (MAALOX/MYLANTA) 200-200-20 MG/5ML suspension Take 15 mLs by mouth every 6 (six) hours as needed for indigestion or heartburn. 355 mL 0 unknown   amLODipine (NORVASC) 10 MG tablet Take 1 tablet (10 mg total) by mouth daily. 30 tablet 2 Past Week   apixaban (ELIQUIS) 5 MG TABS tablet Take 5 mg by mouth 2 (two) times daily.   Past Week   aspirin 81 MG EC tablet Take 1 tablet (81 mg total) by mouth daily. 30 tablet 0 Past Week   atorvastatin (LIPITOR) 80 MG tablet Take 1 tablet (80 mg total) by mouth daily at 6 PM. (Patient taking differently: Take 80 mg by mouth daily.) 30 tablet 0 Past Week   clopidogrel (PLAVIX) 75 MG tablet Take 75 mg by mouth daily.   Past Week   diclofenac Sodium (VOLTAREN) 1 % GEL Rub a small grape sized dollop into the sore joints of hands up to 3 times daily as needed for pain. 150 g 1 unknown   docusate sodium (COLACE) 100 MG capsule Take 100 mg by mouth daily.   Past Week   furosemide (LASIX) 20 MG tablet Take 1 tablet (20 mg total) by mouth daily. 30 tablet 0 Past Week   gabapentin (NEURONTIN) 300 MG capsule TAKE 1 CAPSULE BY MOUTH 3 TIMES A DAY (Patient taking differently: Take 300 mg by mouth 3 (three) times daily.)  90 capsule 6 Past Week   insulin aspart (NOVOLOG FLEXPEN) 100 UNIT/ML FlexPen Inject 20 Units into the skin 3 (three) times daily with meals. 15 mL 11 Past Week   insulin degludec (TRESIBA FLEXTOUCH) 200 UNIT/ML FlexTouch Pen Inject 100 Units into the skin daily. 15 mL 11 Past Week   metoprolol tartrate (LOPRESSOR) 25 MG tablet Take 1 tablet (25 mg total) by mouth 2 (two) times daily. 60 tablet 0 Past Week   nitroGLYCERIN (NITROSTAT) 0.4 MG SL tablet Place 1 tablet (0.4 mg total) under the tongue every 5 (five) minutes as needed for chest pain. 30 tablet 0 unknown   ONETOUCH VERIO test strip Use to test blood sugar twice daily  4    pantoprazole (PROTONIX) 40 MG tablet Take 1 tablet (40 mg total) by mouth 2 (two) times daily before a meal. 60 tablet 0 Past Week   promethazine (PHENERGAN) 25 MG tablet TAKE 1 TO 2 TABLETS BY MOUTH EVERY 4 TO 6 HOURS AS NEEDED FOR NAUSEA (Patient taking differently: Take 25-50 mg by mouth every 6 (six) hours as needed for nausea.) 240 tablet 1 unknown   traMADol (ULTRAM) 50 MG tablet Take 2 tablets (100 mg total) by mouth every 6 (six) hours as needed for moderate pain. 30 tablet 1 unknown   promethazine (PHENERGAN) 25 MG/ML injection Inject 0.5 mLs (12.5 mg total) into the muscle every 6 (six) hours as needed for nausea or vomiting. (Patient not taking: No sig reported) 25 mL 11 Not Taking  ranolazine (RANEXA) 500 MG 12 hr tablet Take 1 tablet (500 mg total) by mouth 2 (two) times daily. (Patient not taking: No sig reported) 60 tablet 0 Not Taking   Scheduled:   amiodarone  200 mg Oral BID   Chlorhexidine Gluconate Cloth  6 each Topical Daily   clotrimazole   Topical BID   feeding supplement (PROSource TF)  45 mL Per Tube Daily   insulin aspart  0-20 Units Subcutaneous Q4H   insulin aspart  8 Units Subcutaneous Q4H   insulin glargine  35 Units Subcutaneous BID   pantoprazole (PROTONIX) IV  40 mg Intravenous Q24H   sodium chloride flush  10-40 mL Intracatheter  Q12H   Infusions:   DAPTOmycin (CUBICIN)  IV Stopped (12/09/20 2336)   feeding supplement (OSMOLITE 1.2 CAL) 1,000 mL (12/10/20 0923)   fentaNYL infusion INTRAVENOUS 200 mcg/hr (12/10/20 0600)   heparin 1,550 Units/hr (12/10/20 0600)   lactated ringers     norepinephrine (LEVOPHED) Adult infusion Stopped (12/10/20 0100)    Assessment: Pt was admitted for DKA. He has been on apixaban PTA for a hx PE? Last dose in the past week? Heparin has been ordered here for new afib. Baseline PTT 28, HL undetectable.   heparin level therapeutic at 0.68, on drip rate 1550 units/hr. CBC wnl. No overt bleeding or infusion issues noted. Given HL undetectable at baseline, likely not affected by PTA Eliquis given noncompliance - will dose based off HL and not aPTT.   Heparin level this PM therapeutic  Goal of Therapy:  Heparin level 0.3-0.7 units/ml Monitor platelets by anticoagulation protocol: Yes   Plan:  Decrease heparin infusion at 1500 units/hr Monitor daily HL, CBC, s/sx bleeding  Jeanella Cara, PharmD, [facility] Clinical Pharmacist Please see AMION for all Pharmacists' Contact Phone Numbers 12/10/2020, 9:37 AM    Please check [email or web address] for unit-specific pharmacy phone numbers.

## 2020-12-10 NOTE — Progress Notes (Signed)
2100: Patient having lots of blood tinged secretions. Instructed patient to cough them out. Patient intermittently following commands to cough. Patient continually desalting to 80s on 8L [place]. Suctioned orally multiple times. Assisted RT with NT suctioning - copious amount of blood tinged secretions out. O2 sats back up to 90s. Elink notified.   2300: Patient still continuing to desat to 80s on 10L [place] and work of breathing increased. Suctioned orally multiple times. Attempted NT suctioning again with RT. Elink MD notified. Order for ABG received.   0000: PCCM ground team at bedside to evaluate patient.   0030: Patient intubated with MD, PA, and RT as follows - 0033 40 mcg of Neo - 0034 50 mcg of Fentanyl - 0037 30 mg of Etomidate - 0037 100 mg Rocuronium - 0039 ETT placed - 0040 40 mcg Neo - 0043 80 mcg Neo

## 2020-12-10 NOTE — Progress Notes (Signed)
    NAME:  Samuel [name], MRN:  [id number], DOB:  May 18, 1951, LOS: 2 ADMISSION DATE:  11/22/2020, CONSULTATION DATE:  11/28/2020 CHIEF COMPLAINT:  DKA   History of Present Illness:  Patient presents with N/V, obtunded. Unable to provide history or get ahold of family. History obtained from chart review and per report.  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 He had 3 days of increased vomiting, per family.  Pertinent  Medical History  IDT2DM, CAD, HTN, h/o MRSA sepsis, h/o osteomyelitis, h/o PE, s/p left BKA  Significant Hospital Events: Including procedures, antibiotic start and stop dates in addition to other pertinent events   - 6/23 admitted - 6/24 worsening secretion burden, intubated   Interim History / Subjective:  Increasing secretions with poor clearance last night despite prompting so intubated.  Objective   Blood pressure (!) 109/58, pulse 84, temperature 98.06 F (36.7 C), resp. rate 19, height 6\' 3"  (1.905 m), weight 104.8 kg, SpO2 98 %.    Vent Mode: PRVC FiO2 (%):  [40 %-100 %] 40 % Set Rate:  [16 bmp] 16 bmp Vt Set:  [600 mL] 600 mL PEEP:  [5 cmH20] 5 cmH20   Intake/Output Summary (Last 24 hours) at 12/10/2020 0800 Last data filed at 12/10/2020 12/12/2020 Gross per 24 hour  Intake 3220.15 ml  Output 2660 ml  Net 560.15 ml    Filed Weights   12/03/2020 2100 12/09/20 0500 12/10/20 0453  Weight: 106.6 kg 107 kg 104.8 kg    Examination: Constitutional: chronically ill appearing man on vent  Eyes: pinpoint, reactive, not tracking Ears, nose, mouth, and throat: dry, poor dentition Cardiovascular: RRR, ext warm, sinus on monitor Respiratory: suprisingly clear, small bloody secretions Gastrointestinal: soft, +BS Skin: LLE BKA, RLE wrapped without strikethrough or signs of proximal infection spread Neurologic: withdraws to pain, was following commands prior to intubation Psychiatric: RASS -4    Labs/imaging that I have personally reviewed  (right click and  "Reselect all SmartList Selections" daily)  CK 992 Cr improved Gap closed   Resolved Hospital Problem list     Assessment & Plan:   DKA in patient with poorly controlled Type 2 DM resolved Severe sepsis secondary to RLE cellulitis with bacteremia: MRSE and MSSA; echo w/o obvious endocarditis Obesity Hx CAD Hx OSA noncompliant CPAP Septic and hypovolemic shock resolved Poorly controlled IDDM Afib converted with amio Metabolic encephalopathy improving Acute hypoxemic respiratory failure due to inability to handle secretions from his encephalopathy  - Dapto per ID, need to keep an eye on CPK, will change to daily checks, gentle LR to reduce this risk (hopefully) - Switch insulin gtt to lantus 35 units BID with 8 units aspart TF coverage; stop D5 fluids, increase TF to goal - Amio to PO - Continue heparin gtt - Vent support, VAP prevention bundle, PAD for RASS -1 to -2   Best practice:  Diet: TF Pain/Anxiety/Delirium protocol (if indicated): fent VAP protocol (if indicated): in place DVT prophylaxis: heparin gtt GI prophylaxis: PPI Glucose control: endotool Mobility: BR Code Status: full Family Communication: will call today Disposition: ICU pending vent liberation  Patient critically ill due to respiratory failure Interventions to address this today vent titration Risk of deterioration without these interventions is high  I personally spent 36 minutes providing critical care not including any separately billable procedures  12/12/20 MD [facility]  Prefer epic messenger for cross cover needs If after hours, please call E-link

## 2020-12-10 NOTE — Procedures (Signed)
Intubation Procedure Note  Samuel [name]  [id number]  November 18, 1950  Date:12/10/20  Time:12:46 AM   Provider Performing:Davison Ohms R Lawerence Dery    Procedure: Intubation (31500)  Indication(s) Respiratory Failure  Consent Unable to obtain consent due to emergent nature of procedure.   Anesthesia Etomidate, Fentanyl, and Rocuronium   Time Out Verified patient identification, verified procedure, site/side was marked, verified correct patient position, special equipment/implants available, medications/allergies/relevant history reviewed, required imaging and test results available.   Sterile Technique Usual hand hygeine, masks, and gloves were used   Procedure Description Patient positioned in bed supine.  Sedation given as noted above.  Patient was intubated with endotracheal tube using Glidescope.  View was Grade 1 full glottis .  Number of attempts was 1.  Colorimetric CO2 detector was consistent with tracheal placement.   Complications/Tolerance None; patient tolerated the procedure well. Chest X-ray is ordered to verify placement.   EBL Minimal   Specimen(s) None   Darcella Gasman Samuel Mayden, PA-C

## 2020-12-10 NOTE — Progress Notes (Signed)
PCCM interval progress note:   Asked to evaluate patient overnight secondary to worsening respiratory status, pt had thick secretions requiring NT suctioning, however secretions quickly re-accumulating with sats dropping onto 80%'s and ineffective cough on 12L HFNC.  ABG unremarkable, however observed worsening respiratory status and increasing tachypnea and accessory muscle use over a short period of time, therefore the decision was made to intubate for hypoxic respiratory failure.   Darcella Gasman Joany Khatib, PA-C

## 2020-12-10 NOTE — Progress Notes (Signed)
eLink Physician-Brief Progress Note Patient Name: Samuel [name] DOB: 06/19/1950 MRN: [id number]   Date of Service  12/10/2020  HPI/Events of Note  Camera : RN saying had around 100 ml of blood from ET suction during this shift. Not active. Happens on suctioning. Tubes changes. Looks stable overall lab and VS wise. aPTT 63, hg improving.  eICU Interventions  Continue current care/heparin. If worsening, need to hold heparin gtt. Getting for A fib hx. Do not suction, unless see blood or pink secretion in ET tube. bleeding could be from oral also. Need to check oral cavity in AM. No melena.      Intervention Category Intermediate Interventions: Bleeding - evaluation and treatment with blood products  Ranee Gosselin 12/10/2020, 5:48 AM

## 2020-12-10 NOTE — Progress Notes (Signed)
ANTICOAGULATION CONSULT NOTE  Pharmacy Consult for heparin Indication: atrial fibrillation  Allergies  Allergen Reactions   Gabapentin Hives, Itching, Nausea And Vomiting and Rash    REACTION: rash/hives (per patient, was a reaction to an inactive ingredient in another mgf brand). The patient stated that he does take this medication now and it doesn't cause a rash any more. LOW DOSE   Relafen [Nabumetone] Nausea And Vomiting and Rash   Codeine Nausea And Vomiting and Rash    Patient Measurements: Height: 6\' 3"  (190.5 cm) Weight: 104.8 kg (231 lb 0.7 oz) IBW/kg (Calculated) : 84.5 Heparin Dosing Weight: 105kg  Vital Signs: Temp: 100.58 F (38.1 C) (06/25 1800) Temp Source: Bladder (06/25 1600) BP: 106/53 (06/25 1700) Pulse Rate: 96 (06/25 1700)  Labs: Recent Labs    06-Jan-2021 1403 01-06-2021 1444 12/09/20 0200 12/09/20 0640 12/09/20 0730 12/09/20 1512 12/09/20 1700 12/09/20 2158 12/09/20 2308 12/09/20 2341 12/10/20 0159 12/10/20 0359 12/10/20 0754 12/10/20 1657  HGB 15.4   < > 13.0 12.9*  --   --   --   --   --  11.6* 12.2*  --   --   --   HCT 45.6   < > 37.7* 38.0*  --   --   --   --   --  34.0* 36.0*  --   --   --   PLT 246  --  230  --   --   --   --   --   --   --   --   --   --   --   APTT 28  --   --   --  137*  --   --   --  127*  --   --   --   --   --   LABPROT 14.5  --   --   --   --   --   --   --   --   --   --   --   --   --   INR 1.1  --   --   --   --   --   --   --   --   --   --   --   --   --   HEPARINUNFRC  --    < >  --   --  0.56  --  0.44  --   --   --   --  0.68  --  0.56  CREATININE 4.01*   < > 3.23*  --  2.68* 1.90*  --  1.53*  --   --   --   --  1.18  --   CKTOTAL  --   --   --   --   --   --   --   --   --   --   --  992*  --   --    < > = values in this interval not displayed.     Estimated Creatinine Clearance: 79.6 mL/min (by C-G formula based on SCr of 1.18 mg/dL).   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 of 2012   Shortness of breath    exertion   Type II diabetes mellitus (HCC)     Medications:  Medications Prior to Admission  Medication Sig Dispense Refill Last Dose   acarbose (PRECOSE) 50 MG tablet Take 50 mg by mouth 3 (three) times daily with meals.   Past Week   alum & mag hydroxide-simeth (MAALOX/MYLANTA) 200-200-20 MG/5ML suspension Take 15 mLs by mouth every 6 (six) hours as needed for indigestion or heartburn. 355 mL 0 unknown   amLODipine (NORVASC) 10 MG tablet Take 1 tablet (10 mg total) by mouth daily. 30 tablet 2 Past Week   apixaban (ELIQUIS) 5 MG TABS tablet Take 5 mg by mouth 2 (two) times daily.   Past Week   aspirin 81 MG EC tablet Take 1 tablet (81 mg total) by mouth daily. 30 tablet 0 Past Week   atorvastatin (LIPITOR) 80 MG tablet Take 1 tablet (80 mg total) by mouth daily at 6 PM. (Patient taking differently: Take 80 mg by mouth daily.) 30 tablet 0 Past Week   clopidogrel (PLAVIX) 75 MG tablet Take 75 mg by mouth daily.   Past Week   diclofenac Sodium (VOLTAREN) 1 % GEL Rub a small grape sized dollop into the sore joints of hands up to 3 times daily as needed for pain. 150 g 1 unknown   docusate sodium (COLACE) 100 MG capsule Take 100 mg by mouth daily.   Past Week   furosemide (LASIX) 20 MG tablet Take 1 tablet (20 mg total) by mouth daily. 30 tablet 0 Past Week   gabapentin (NEURONTIN) 300 MG capsule TAKE 1  CAPSULE BY MOUTH 3 TIMES A DAY (Patient taking differently: Take 300 mg by mouth 3 (three) times daily.) 90 capsule 6 Past Week   insulin aspart (NOVOLOG FLEXPEN) 100 UNIT/ML FlexPen Inject 20 Units into the skin 3 (three) times daily with meals. 15 mL 11 Past Week   insulin degludec (TRESIBA FLEXTOUCH) 200 UNIT/ML FlexTouch Pen Inject 100 Units into the skin daily. 15 mL 11 Past Week   metoprolol tartrate (LOPRESSOR) 25 MG tablet Take 1 tablet (25 mg total) by mouth 2 (two) times daily. 60 tablet 0 Past Week   nitroGLYCERIN (NITROSTAT) 0.4 MG SL tablet Place 1 tablet (0.4 mg total) under the tongue every 5 (five) minutes as needed for chest pain. 30 tablet 0 unknown   ONETOUCH VERIO test strip Use to test blood sugar twice daily  4    pantoprazole (PROTONIX) 40 MG tablet Take 1 tablet (40 mg total) by mouth 2 (two) times daily before a meal. 60 tablet 0 Past Week   promethazine (PHENERGAN) 25 MG tablet TAKE 1 TO 2 TABLETS BY MOUTH EVERY 4 TO 6 HOURS AS NEEDED FOR NAUSEA (Patient taking differently: Take 25-50 mg by mouth every 6 (six) hours as needed for nausea.) 240 tablet 1 unknown   traMADol (ULTRAM) 50 MG tablet Take 2 tablets (100 mg total) by mouth every 6 (six) hours as needed for moderate pain. 30 tablet 1 unknown   promethazine (PHENERGAN) 25  MG/ML injection Inject 0.5 mLs (12.5 mg total) into the muscle every 6 (six) hours as needed for nausea or vomiting. (Patient not taking: No sig reported) 25 mL 11 Not Taking   ranolazine (RANEXA) 500 MG 12 hr tablet Take 1 tablet (500 mg total) by mouth 2 (two) times daily. (Patient not taking: No sig reported) 60 tablet 0 Not Taking   Scheduled:   amiodarone  200 mg Oral BID   Chlorhexidine Gluconate Cloth  6 each Topical Daily   clotrimazole   Topical BID   docusate  100 mg Per Tube BID   feeding supplement (PROSource TF)  45 mL Per Tube Daily   fentaNYL (SUBLIMAZE) injection  25 mcg Intravenous Once   insulin aspart  0-20 Units Subcutaneous Q4H    insulin aspart  8 Units Subcutaneous Q4H   insulin glargine  35 Units Subcutaneous BID   pantoprazole (PROTONIX) IV  40 mg Intravenous Q24H   polyethylene glycol  17 g Per Tube Daily   sodium chloride flush  10-40 mL Intracatheter Q12H   Infusions:   DAPTOmycin (CUBICIN)  IV Stopped (12/09/20 2336)   dexmedetomidine (PRECEDEX) IV infusion 0.2 mcg/kg/hr (12/10/20 1800)   feeding supplement (OSMOLITE 1.2 CAL) 1,000 mL (12/10/20 0923)   fentaNYL infusion INTRAVENOUS 175 mcg/hr (12/10/20 1800)   heparin 1,500 Units/hr (12/10/20 1800)   lactated ringers 75 mL/hr at 12/10/20 1800   norepinephrine (LEVOPHED) Adult infusion Stopped (12/10/20 0100)    Assessment: Pt was admitted for DKA. He has been on apixaban PTA for a hx PE? Last dose in the past week? Heparin has been ordered here for new afib. Baseline PTT 28, HL undetectable.   Heparin level continues to be at goal 0.56, on drip rate 1500 units/hr. CBC wnl. No overt bleeding or infusion issues noted.   Goal of Therapy:  Heparin level 0.3-0.7 units/ml Monitor platelets by anticoagulation protocol: Yes   Plan:  Continue heparin infusion at 1500 units/hr Monitor daily HL, CBC, s/sx bleeding  [name] PharmD., BCPS Clinical Pharmacist 12/10/2020 6:19 PM Please check [email or web address] for unit-specific pharmacy phone numbers.

## 2020-12-10 NOTE — Progress Notes (Signed)
eLink Physician-Brief Progress Note Patient Name: Samuel [name] DOB: 1951-04-09 MRN: [id number]   Date of Service  12/10/2020  HPI/Events of Note  Asking for tylenol for fever > 101. ID on board: Intubated 6/24 for airway protection Off pressors but fluctuating mentation still Repeat bcx 6/25 in progress Admission bcx pending susceptibility  On dapto. TEE next week to look for any vegetations.   eICU Interventions  Tylenol liquid via NG prn ordered.       Intervention Category Intermediate Interventions: Other:  Ranee Gosselin 12/10/2020, 8:12 PM

## 2020-12-11 DIAGNOSIS — A419 Sepsis, unspecified organism: Secondary | ICD-10-CM

## 2020-12-11 LAB — GLUCOSE, CAPILLARY
Glucose-Capillary: 211 mg/dL — ABNORMAL HIGH (ref 70–99)
Glucose-Capillary: 168 mg/dL — ABNORMAL HIGH (ref 70–99)
Glucose-Capillary: 166 mg/dL — ABNORMAL HIGH (ref 70–99)
Glucose-Capillary: 153 mg/dL — ABNORMAL HIGH (ref 70–99)
Glucose-Capillary: 137 mg/dL — ABNORMAL HIGH (ref 70–99)
Glucose-Capillary: 141 mg/dL — ABNORMAL HIGH (ref 70–99)
Glucose-Capillary: 155 mg/dL — ABNORMAL HIGH (ref 70–99)
Glucose-Capillary: 157 mg/dL — ABNORMAL HIGH (ref 70–99)
Glucose-Capillary: 198 mg/dL — ABNORMAL HIGH (ref 70–99)

## 2020-12-11 LAB — BASIC METABOLIC PANEL
Anion gap: 6 (ref 5–15)
BUN: 46 mg/dL — ABNORMAL HIGH (ref 8–23)
CO2: 23 mmol/L (ref 22–32)
Calcium: 8 mg/dL — ABNORMAL LOW (ref 8.9–10.3)
Chloride: 113 mmol/L — ABNORMAL HIGH (ref 98–111)
Creatinine, Ser: 1.2 mg/dL (ref 0.61–1.24)
GFR, Estimated: >60 mL/min (ref >60)
Glucose, Bld: 244 mg/dL — ABNORMAL HIGH (ref 70–99)
Potassium: 4.2 mmol/L (ref 3.5–5.1)
Sodium: 142 mmol/L (ref 135–145)

## 2020-12-11 LAB — CBC
HCT: 33.3 % — ABNORMAL LOW (ref 39.0–52.0)
Hemoglobin: 11.5 g/dL — ABNORMAL LOW (ref 13.0–17.0)
MCH: 30.5 pg (ref 26.0–34.0)
MCHC: 34.5 g/dL (ref 30.0–36.0)
MCV: 88.3 fL (ref 80.0–100.0)
Platelets: 125 10*3/uL — ABNORMAL LOW (ref 150–400)
RBC: 3.77 MIL/uL — ABNORMAL LOW (ref 4.22–5.81)
RDW: 15.1 % (ref 11.5–15.5)
WBC: 12.8 10*3/uL — ABNORMAL HIGH (ref 4.0–10.5)
nRBC: 0.2 % (ref 0.0–0.2)

## 2020-12-11 LAB — HEPARIN LEVEL (UNFRACTIONATED): Heparin Unfractionated: 0.4 IU/mL (ref 0.30–0.70)

## 2020-12-11 LAB — MRSA NEXT GEN BY PCR, NASAL: MRSA by PCR Next Gen: NOT DETECTED

## 2020-12-11 LAB — CK: Total CK: 755 U/L — ABNORMAL HIGH (ref 49–397)

## 2020-12-11 MED ORDER — CHLORHEXIDINE GLUCONATE 0.12 % MT SOLN
OROMUCOSAL | Status: AC
  Administered 2020-12-11: 15 mL via OROMUCOSAL
  Filled 2020-12-11: qty 15

## 2020-12-11 MED ORDER — ORAL CARE MOUTH RINSE
15.0000 mL | OROMUCOSAL | Status: DC
  Administered 2020-12-11 – 2020-12-17 (×63): 15 mL via OROMUCOSAL

## 2020-12-11 MED ORDER — INSULIN GLARGINE 100 UNIT/ML ~~LOC~~ SOLN
45.0000 [IU] | Freq: Two times a day (BID) | SUBCUTANEOUS | Status: DC
  Administered 2020-12-11 – 2020-12-12 (×4): 45 [IU] via SUBCUTANEOUS
  Filled 2020-12-11 (×7): qty 0.45

## 2020-12-11 MED ORDER — INSULIN ASPART 100 UNIT/ML IJ SOLN
12.0000 [IU] | INTRAMUSCULAR | Status: DC
  Administered 2020-12-11 (×2): 12 [IU] via SUBCUTANEOUS

## 2020-12-11 MED ORDER — CHLORHEXIDINE GLUCONATE 0.12% ORAL RINSE (MEDLINE KIT)
15.0000 mL | Freq: Two times a day (BID) | OROMUCOSAL | Status: DC
  Administered 2020-12-11 – 2020-12-17 (×12): 15 mL via OROMUCOSAL

## 2020-12-11 NOTE — Progress Notes (Signed)
Dr. Katrinka Blazing notified of patient's bloody ETT and throat secretions. Verbal order to hold heparin gtt over night received.

## 2020-12-11 NOTE — Progress Notes (Signed)
Referred pt to [facility] and spoke with Ms Sunny Schlein, informed about the bld sugar result from Am shift = 130-170 mg/dl and pt had insulin sliding scale plus standing order of 12 unit, and bld sugar monitoring done every 2 hours as per power order review, it has existing order of monitoring of bld sugar every 2 hrs and every 4 hrs, Ms Sunny Schlein said will inform the dr about it

## 2020-12-11 NOTE — Progress Notes (Signed)
Received pt on bed comfortable not in distress with ETT to MV, Ps mode with Fi02 40%, with Fentanyl drip at 75 mcg/hr, IVF at 75 ml/hr, heparin drip on hold due to bleeding from ETT suctioning, with ongoing tube feeding , with foley catheter to bag, with bilateral soft arm restraint, and bld sugar monitoring

## 2020-12-11 NOTE — Progress Notes (Signed)
NAME:  Samuel [name], MRN:  [id number], DOB:  June 15, 1951, LOS: 3 ADMISSION DATE:  11/27/2020, CONSULTATION DATE:  12/14/2020 CHIEF COMPLAINT:  DKA   History of Present Illness:  Patient presents with N/V, obtunded. Unable to provide history or get ahold of family. History obtained from chart review and per report.  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 He had 3 days of increased vomiting, per family.  Pertinent  Medical History  IDT2DM, CAD, HTN, h/o MRSA sepsis, h/o osteomyelitis, h/o PE, s/p left BKA  Significant Hospital Events: Including procedures, antibiotic start and stop dates in addition to other pertinent events   - 6/23 admitted - 6/24 worsening secretion burden, intubated   Interim History / Subjective:  No events. Remains heavily sedated on vent. Some low grade fevers overnight.  Objective   Blood pressure (!) 108/58, pulse 90, temperature 99.68 F (37.6 C), resp. rate 18, height 6\' 3"  (1.905 m), weight 105 kg, SpO2 98 %.    Vent Mode: PRVC FiO2 (%):  [40 %] 40 % Set Rate:  [16 bmp] 16 bmp Vt Set:  [600 mL] 600 mL PEEP:  [5 cmH20] 5 cmH20 Pressure Support:  [10 cmH20] 10 cmH20 Plateau Pressure:  [16 cmH20] 16 cmH20   Intake/Output Summary (Last 24 hours) at 12/11/2020 12/13/2020 Last data filed at 12/11/2020 0700 Gross per 24 hour  Intake 3324.18 ml  Output 1480 ml  Net 1844.18 ml    Filed Weights   12/09/20 0500 12/10/20 0453 12/11/20 0500  Weight: 107 kg 104.8 kg 105 kg    Examination: Constitutional: sedated man on vent  Eyes: pupils small, reactive Ears, nose, mouth, and throat: ETT in place, poor dentition, small to moderate bloody secretions Cardiovascular: RRR, ext warm, faint SEM Respiratory: clear, triggers vent Gastrointestinal: soft, +BS Skin: RLE wrapped, no strikethrough Neurologic: will assess as sedation is weaned Psychiatric: will assess as sedation is weaned     Labs/imaging that I have personally reviewed  (right click and  "Reselect all SmartList Selections" daily)  CK improved Renal function stable CBGs up slightly on current regimen  Resolved Hospital Problem list   DKA in patient with poorly controlled Type 2 DM resolved  Assessment & Plan:    Severe sepsis secondary to RLE cellulitis with bacteremia: MRSE and MSSA; echo w/o obvious endocarditis DM with hyperglycemia- A1c 6/23 7.0, on 100 units insulin at home Obesity Hx CAD Hx OSA noncompliant CPAP Septic and hypovolemic shock resolved Afib converted with amio Metabolic encephalopathy improving Acute hypoxemic respiratory failure due to inability to handle secretions from his encephalopathy  - Dapto per ID, need to keep an eye on CPK, continue LR to reduce risk - lantus 35 units BID with 8 units to lantus 45 units BID with 12 units q4h TF coverage - Amio to PO - Continue heparin gtt - Local wound care to RLE - TEE next week sometime - Work on sedation and vent lightening today to see if we can give him shot at extubation, secretions and mental status will be biggest issue; if borderline may just leave intubated so we can get TEE  Best practice:  Diet: TF Pain/Anxiety/Delirium protocol (if indicated): fent VAP protocol (if indicated): in place DVT prophylaxis: heparin gtt GI prophylaxis: PPI Glucose control: endotool Mobility: BR Code Status: full Family Communication: called 6/25 and updated Disposition: ICU pending vent liberation  Patient critically ill due to respiratory failure Interventions to address this today vent titration Risk of deterioration without these interventions  is high  I personally spent 35 minutes providing critical care not including any separately billable procedures  Myrla Halsted MD  Pulmonary Critical Care  Prefer epic messenger for cross cover needs If after hours, please call E-link

## 2020-12-11 NOTE — Progress Notes (Signed)
[facility] for Infectious Disease  Date of Admission:  12/12/2020     CC: Septic shock Staph aureus bacteremia  Lines: 6/23-c left subclavian cvc 6/23-c foley peripheral  Abx: 6/24-c dapto  6/23-24 vanc/cefepime/flagyl  ASSESSMENT: Community acquired staph aureus bacteremia Septic shock Metabolic encephalopathy Aki Respiratory failure s/p intubation 6/24   Intubated 6/24 for airway protection Off pressors but fluctuating mentation still Repeat bcx 6/25 in progress Admission bcx pending susceptibility   6/26 assessment  Daptomycin started 6/24; cpk a little high; on ivf. Ck down trending today  Discussed with micro on susceptibility for staph epi The blood cx showed phenotypic mssa; the staph epi susceptibility is pending The cepheid PCR (mec a/c and MREJ) is highly predictive and with good concordance in terms of phenotypic correlation for mssa/mrsa. So will switch dapto to cefazolin  The staph hominis/epi are contaminants Repeat bcx 6/25 negative  Tte so far no obvious vegetation but will need tee and further investigation of metastatic foci on involvement once he is more awake      PLAN: Stop daptomycin Start cefazolin 2 gram iv q8hours Can stop iv fluid if ok with PCCM F/u final blood cx from 6/25 Once more alert will investigate for other point of tenderness/metastatic foci of infection TEE next week when able to help define abx duration   I spent more than 35 minute reviewing data/chart, and coordinating care and >50% direct face to face time providing counseling/discussing diagnostics/treatment plan with patient    Active Problems:   Severe sepsis with septic shock ([place])   DKA (diabetic ketoacidosis) ([place])   Acute respiratory failure with hypoxia ([place])   Pressure injury of skin   Staphylococcus aureus bacteremia   Allergies  Allergen Reactions   Gabapentin Hives, Itching, Nausea And Vomiting and Rash    REACTION:  rash/hives (per patient, was a reaction to an inactive ingredient in another mgf brand). The patient stated that he does take this medication now and it doesn't cause a rash any more. LOW DOSE   Relafen [Nabumetone] Nausea And Vomiting and Rash   Codeine Nausea And Vomiting and Rash    Scheduled Meds:  amiodarone  200 mg Per Tube BID   chlorhexidine gluconate (MEDLINE KIT)  15 mL Mouth Rinse BID   Chlorhexidine Gluconate Cloth  6 each Topical Daily   clotrimazole   Topical BID   docusate  100 mg Per Tube BID   feeding supplement (PROSource TF)  45 mL Per Tube Daily   fentaNYL (SUBLIMAZE) injection  25 mcg Intravenous Once   insulin aspart  0-20 Units Subcutaneous Q4H   insulin aspart  12 Units Subcutaneous Q4H   insulin glargine  45 Units Subcutaneous BID   mouth rinse  15 mL Mouth Rinse 10 times per day   pantoprazole (PROTONIX) IV  40 mg Intravenous Q24H   polyethylene glycol  17 g Per Tube Daily   sodium chloride flush  10-40 mL Intracatheter Q12H   Continuous Infusions:  DAPTOmycin (CUBICIN)  IV Stopped (12/10/20 2101)   dexmedetomidine (PRECEDEX) IV infusion Stopped (12/11/20 0817)   feeding supplement (OSMOLITE 1.2 CAL) 40 mL/hr at 12/11/20 1100   fentaNYL infusion INTRAVENOUS 75 mcg/hr (12/11/20 1200)   heparin 1,500 Units/hr (12/11/20 1200)   lactated ringers 75 mL/hr at 12/11/20 1200   PRN Meds:.dextrose, fentaNYL, polyethylene glycol, sodium chloride flush  Subjective: Bcx with mssa No fever Wbc mildly elevated No event overnight On minimal vent setting Waking up  not yet extubated On ivf still No pressors  Review of Systems: ROS All other ROS was negative, except mentioned above     OBJECTIVE: Vitals:   12/11/20 1030 12/11/20 1100 12/11/20 1130 12/11/20 1200  BP: (!) 124/50 (!) 125/56 121/61 (!) 126/59  Pulse: 95 94 94 97  Resp: _0 Temp: 99.5 F (37.5 C) 99.5 F (37.5 C) 99.68 F (37.6 C) 99.68 F (37.6 C)  TempSrc:      SpO2: 97% 97%  96% 100%  Weight:      Height:       Body mass index is 28.93 kg/m.  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; right foot partial hallux amputation    Lab Results Lab Results  Component Value Date   WBC 12.8 (H) 12/11/2020   HGB 11.5 (L) 12/11/2020   HCT 33.3 (L) 12/11/2020   MCV 88.3 12/11/2020   PLT 125 (L) 12/11/2020    Lab Results  Component Value Date   CREATININE 1.20 12/11/2020   BUN 46 (H) 12/11/2020   NA 142 12/11/2020   K 4.2 12/11/2020   CL 113 (H) 12/11/2020   CO2 23 12/11/2020    Lab Results  Component Value Date   ALT 18 11/16/2020   AST 29 12/11/2020   ALKPHOS 72 11/28/2020   BILITOT 3.9 (H) 11/26/2020      Microbiology: Recent Results (from the past 240 hour(s))  Culture, blood (Routine x 2)     Status: Abnormal (Preliminary result)   Collection Time: 12/09/2020  2:03 PM   Specimen: BLOOD  Result Value Ref Range Status   Specimen Description BLOOD LEFT ANTECUBITAL  Final   Special Requests   Final    BOTTLES DRAWN AEROBIC AND ANAEROBIC Blood Culture results may not be optimal due to an inadequate volume of blood received in culture bottles   Culture  Setup Time   Final    GRAM POSITIVE COCCI IN CLUSTERS AEROBIC BOTTLE ONLY CRITICAL VALUE NOTED.  VALUE IS CONSISTENT WITH PREVIOUSLY REPORTED AND CALLED VALUE.    Culture (A)  Final    STAPHYLOCOCCUS HOMINIS CULTURE REINCUBATED FOR BETTER GROWTH Performed at [facility] [address] [place] [address]   Report Status PENDING  Incomplete  Culture, blood (Routine x 2)     Status:  Abnormal (Preliminary result)   Collection Time: 12/01/2020  2:31 PM   Specimen: BLOOD  Result Value Ref Range Status   Specimen Description BLOOD LEFT ANTECUBITAL  Final   Special Requests   Final    BOTTLES DRAWN AEROBIC AND ANAEROBIC Blood Culture adequate volume   Culture  Setup Time   Final    GRAM POSITIVE COCCI IN CLUSTERS IN BOTH AEROBIC AND ANAEROBIC BOTTLES CRITICAL RESULT CALLED TO, READ BACK BY AND VERIFIED WITH: PHARMD J.FRENS AT 0818 ON 12/09/2020 BY T.SAAD. Performed at [facility] [address] [place] [address]   Culture (A)  Final    STAPHYLOCOCCUS AUREUS STAPHYLOCOCCUS EPIDERMIDIS    Report Status PENDING  Incomplete   Organism ID, Bacteria STAPHYLOCOCCUS AUREUS  Final  Susceptibility   Staphylococcus aureus - MIC*    CIPROFLOXACIN <=0.5 SENSITIVE Sensitive     ERYTHROMYCIN <=0.25 SENSITIVE Sensitive     GENTAMICIN <=0.5 SENSITIVE Sensitive     OXACILLIN 0.5 SENSITIVE Sensitive     TETRACYCLINE <=1 SENSITIVE Sensitive     VANCOMYCIN <=0.5 SENSITIVE Sensitive     TRIMETH/SULFA <=10 SENSITIVE Sensitive     CLINDAMYCIN <=0.25 SENSITIVE Sensitive     RIFAMPIN <=0.5 SENSITIVE Sensitive     Inducible Clindamycin NEGATIVE Sensitive     * STAPHYLOCOCCUS AUREUS  Blood Culture ID Panel (Reflexed)     Status: Abnormal   Collection Time: 11/30/2020  2:31 PM  Result Value Ref Range Status   Enterococcus faecalis NOT DETECTED NOT DETECTED Final   Enterococcus Faecium NOT DETECTED NOT DETECTED Final   Listeria monocytogenes NOT DETECTED NOT DETECTED Final   Staphylococcus species DETECTED (A) NOT DETECTED Final    Comment: CRITICAL RESULT CALLED TO, READ BACK BY AND VERIFIED WITH: PHARMD J.FRENS AT 0818 ON 12/09/2020 BY T.SAAD.    Staphylococcus aureus (BCID) DETECTED (A) NOT DETECTED Final    Comment: CRITICAL RESULT CALLED TO, READ BACK BY AND VERIFIED WITH: PHARMD J.FRENS AT 0818 ON 12/09/2020 BY T.SAAD.    Staphylococcus epidermidis DETECTED  (A) NOT DETECTED Final    Comment: CRITICAL RESULT CALLED TO, READ BACK BY AND VERIFIED WITH: PHARMD J.FRENS AT 0818 ON 12/09/2020 BY T.SAAD.    Staphylococcus lugdunensis NOT DETECTED NOT DETECTED Final   Streptococcus species NOT DETECTED NOT DETECTED Final   Streptococcus agalactiae NOT DETECTED NOT DETECTED Final   Streptococcus pneumoniae NOT DETECTED NOT DETECTED Final   Streptococcus pyogenes NOT DETECTED NOT DETECTED Final   A.calcoaceticus-baumannii NOT DETECTED NOT DETECTED Final   Bacteroides fragilis NOT DETECTED NOT DETECTED Final   Enterobacterales NOT DETECTED NOT DETECTED Final   Enterobacter cloacae complex NOT DETECTED NOT DETECTED Final   Escherichia coli NOT DETECTED NOT DETECTED Final   Klebsiella aerogenes NOT DETECTED NOT DETECTED Final   Klebsiella oxytoca NOT DETECTED NOT DETECTED Final   Klebsiella pneumoniae NOT DETECTED NOT DETECTED Final   Proteus species NOT DETECTED NOT DETECTED Final   Salmonella species NOT DETECTED NOT DETECTED Final   Serratia marcescens NOT DETECTED NOT DETECTED Final   Haemophilus influenzae NOT DETECTED NOT DETECTED Final   Neisseria meningitidis NOT DETECTED NOT DETECTED Final   Pseudomonas aeruginosa NOT DETECTED NOT DETECTED Final   Stenotrophomonas maltophilia NOT DETECTED NOT DETECTED Final   Candida albicans NOT DETECTED NOT DETECTED Final   Candida auris NOT DETECTED NOT DETECTED Final   Candida glabrata NOT DETECTED NOT DETECTED Final   Candida krusei NOT DETECTED NOT DETECTED Final   Candida parapsilosis NOT DETECTED NOT DETECTED Final   Candida tropicalis NOT DETECTED NOT DETECTED Final   Cryptococcus neoformans/gattii NOT DETECTED NOT DETECTED Final   Methicillin resistance mecA/C DETECTED (A) NOT DETECTED Final    Comment: CRITICAL RESULT CALLED TO, READ BACK BY AND VERIFIED WITH: PHARMD J.FRENS AT 0818 ON 12/09/2020 BY T.SAAD.    Meth resistant mecA/C and MREJ NOT DETECTED NOT DETECTED Final    Comment: Performed  at [facility], [place] [address], [place], [place] [address]  Resp Panel by RT-PCR (Flu A&B, Covid) Nasopharyngeal Swab     Status: None   Collection Time: 11/24/2020  5:38 PM   Specimen: Nasopharyngeal Swab; Nasopharyngeal(NP) swabs in vial transport medium  Result Value Ref Range Status   SARS Coronavirus 2 by RT PCR NEGATIVE NEGATIVE  Final    Comment: (NOTE) SARS-CoV-2 target nucleic acids are NOT DETECTED.  The SARS-CoV-2 RNA is generally detectable in upper respiratory specimens during the acute phase of infection. The lowest concentration of SARS-CoV-2 viral copies this assay can detect is 138 copies/mL. A negative result does not preclude SARS-Cov-2 infection and should not be used as the sole basis for treatment or other patient management decisions. A negative result may occur with  improper specimen collection/handling, submission of specimen other than nasopharyngeal swab, presence of viral mutation(s) within the areas targeted by this assay, and inadequate number of viral copies(<138 copies/mL). A negative result must be combined with clinical observations, patient history, and epidemiological information. The expected result is Negative.  Fact Sheet for Patients:  EntrepreneurPulse.com.au  Fact Sheet for Healthcare Providers:  IncredibleEmployment.be  This test is no t yet approved or cleared by the [place] FDA and  has been authorized for detection and/or diagnosis of SARS-CoV-2 by FDA under an Emergency Use Authorization (EUA). This EUA will remain  in effect (meaning this test can be used) for the duration of the COVID-19 declaration under Section 564(b)(1) of the Act, 21 U.S.C.section 360bbb-3(b)(1), unless the authorization is terminated  or revoked sooner.       Influenza A by PCR NEGATIVE NEGATIVE Final   Influenza B by PCR NEGATIVE NEGATIVE Final    Comment: (NOTE) The Xpert Xpress SARS-CoV-2/FLU/RSV plus assay  is intended as an aid in the diagnosis of influenza from Nasopharyngeal swab specimens and should not be used as a sole basis for treatment. Nasal washings and aspirates are unacceptable for Xpert Xpress SARS-CoV-2/FLU/RSV testing.  Fact Sheet for Patients: EntrepreneurPulse.com.au  Fact Sheet for Healthcare Providers: IncredibleEmployment.be  This test is not yet approved or cleared by the [place] FDA and has been authorized for detection and/or diagnosis of SARS-CoV-2 by FDA under an Emergency Use Authorization (EUA). This EUA will remain in effect (meaning this test can be used) for the duration of the COVID-19 declaration under Section 564(b)(1) of the Act, 21 U.S.C. section 360bbb-3(b)(1), unless the authorization is terminated or revoked.  Performed at [facility], [place] [address] [place], [place] [address]   MRSA Next Gen by PCR, Nasal     Status: None   Collection Time: 11/29/2020  7:45 PM   Specimen: Nasal Mucosa; Nasal Swab  Result Value Ref Range Status   MRSA by PCR Next Gen NOT DETECTED NOT DETECTED Final    Comment: (NOTE) The GeneXpert MRSA Assay (FDA approved for NASAL specimens only), is one component of a comprehensive MRSA colonization surveillance program. It is not intended to diagnose MRSA infection nor to guide or monitor treatment for MRSA infections. Test performance is not FDA approved in patients less than 67 years old. Performed at [facility], [place] [address] [place], [place] [address]   Culture, blood (routine x 2)     Status: None (Preliminary result)   Collection Time: 12/10/20  7:49 AM   Specimen: BLOOD RIGHT HAND  Result Value Ref Range Status   Specimen Description BLOOD RIGHT HAND  Final   Special Requests   Final    BOTTLES DRAWN AEROBIC ONLY Blood Culture adequate volume   Culture   Final    NO GROWTH 1 DAY Performed at [facility], [place] [address] [place], [place]  [address]    Report Status PENDING  Incomplete  Culture, blood (routine x 2)     Status: None (Preliminary result)  Collection Time: 12/10/20  7:54 AM   Specimen: BLOOD RIGHT HAND  Result Value Ref Range Status   Specimen Description BLOOD RIGHT HAND  Final   Special Requests   Final    BOTTLES DRAWN AEROBIC ONLY Blood Culture adequate volume   Culture   Final    NO GROWTH 1 DAY Performed at [facility], [place] [address], [place], [place] [address]    Report Status PENDING  Incomplete     Serology:   Imaging: If present, new imagings (plain films, ct scans, and mri) have been personally visualized and interpreted; radiology reports have been reviewed. Decision making incorporated into the Impression / Recommendations.  6/24 tte  1. Left ventricular ejection fraction, by estimation, is 70 to 75%. The  left ventricle has hyperdynamic function. The left ventricle has no  regional wall motion abnormalities. The left ventricular internal cavity  size was mildly dilated. There is mild  left ventricular hypertrophy. Left ventricular diastolic parameters are  consistent with Grade I diastolic dysfunction (impaired relaxation).  Elevated left atrial pressure.   2. Right ventricular systolic function is normal. The right ventricular  size is normal.   3. The mitral valve is normal in structure. Trivial mitral valve  regurgitation. No evidence of mitral stenosis.   4. The aortic valve is tricuspid. Aortic valve regurgitation is not  visualized. No aortic stenosis is present.   5. Aortic dilatation noted. There is mild dilatation of the aortic root,  measuring 39 mm.   Jabier Mutton, [facility] [place] [phone] pager    12/11/2020, 12:36 PM

## 2020-12-11 NOTE — Progress Notes (Signed)
Spoke with Dr Arsenio Loader over the phone, discussed about the blood sugar result and the insulin  coverage, Dr Arsenio Loader ordered to monitor bld sugar every 4 hours and to discontinue standing order of 12 unit insulin

## 2020-12-11 NOTE — Progress Notes (Signed)
eLink Physician-Brief Progress Note Patient Name: Samuel [name] DOB: 07/22/1950 MRN: [id number]   Date of Service  12/11/2020  HPI/Events of Note  Patient is on Levemir 45 units [place] Q 12 hours, Q 4 hour resistant Novolog SSI and Novolgy 12 unit [place] Q 4 hours. Blood glucose = 157.  eICU Interventions  Plan: Will D/C Novolog 12 units  Q 4 hours for now.      Intervention Category Major Interventions: Hyperglycemia - active titration of insulin therapy  Lenell Antu 12/11/2020, 11:12 PM

## 2020-12-11 NOTE — Progress Notes (Signed)
Lots of bloody secretions and did not wake up too well.  Question of dental injury during intubation as much of the blood seems to be oral (mild though).  Would probably get TEE tomorrow before giving him a shot off vent.

## 2020-12-11 NOTE — Progress Notes (Signed)
[place] for heparin Indication: atrial fibrillation  Allergies  Allergen Reactions   Gabapentin Hives, Itching, Nausea And Vomiting and Rash    REACTION: rash/hives (per patient, was a reaction to an inactive ingredient in another mgf brand). The patient stated that he does take this medication now and it doesn't cause a rash any more. LOW DOSE   Relafen [Nabumetone] Nausea And Vomiting and Rash   Codeine Nausea And Vomiting and Rash    Patient Measurements: Height: 6' 3"  (190.5 cm) Weight: 105 kg (231 lb 7.7 oz) IBW/kg (Calculated) : 84.5 Heparin Dosing Weight: 105kg  Vital Signs: Temp: 99.68 F (37.6 C) (06/26 0700) Temp Source: Bladder (06/26 0400) BP: 115/59 (06/26 0700) Pulse Rate: 95 (06/26 0700)  Labs: Recent Labs    11/30/2020 1403 11/24/2020 1444 12/09/20 0200 12/09/20 0640 12/09/20 0730 12/09/20 1512 12/09/20 2158 12/09/20 2308 12/09/20 2341 12/10/20 0159 12/10/20 0359 12/10/20 0754 12/10/20 1657 12/11/20 0430 12/11/20 0440  HGB 15.4   < > 13.0   < >  --   --   --   --  11.6* 12.2*  --   --   --  11.5*  --   HCT 45.6   < > 37.7*   < >  --   --   --   --  34.0* 36.0*  --   --   --  33.3*  --   PLT 246  --  230  --   --   --   --   --   --   --   --   --   --  125*  --   APTT 28  --   --   --  137*  --   --  127*  --   --   --   --   --   --   --   LABPROT 14.5  --   --   --   --   --   --   --   --   --   --   --   --   --   --   INR 1.1  --   --   --   --   --   --   --   --   --   --   --   --   --   --   HEPARINUNFRC  --    < >  --   --  0.56   < >  --   --   --   --  0.68  --  0.56  --  0.40  CREATININE 4.01*   < > 3.23*  --  2.68*   < > 1.53*  --   --   --   --  1.18  --  1.20  --   CKTOTAL  --   --   --   --   --   --   --   --   --   --  992*  --   --  755*  --    < > = values in this interval not displayed.     Estimated Creatinine Clearance: 78.3 mL/min (by C-G formula based on SCr of 1.2  mg/dL).   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 of 2012   Shortness of breath    exertion   Type II diabetes mellitus (H[facility])     Medications:  Medications Prior to Admission  Medication Sig Dispense Refill Last Dose   acarbose (PRECOSE) 50 MG tablet Take 50 mg by mouth 3 (three) times daily with meals.   Past Week   alum & mag hydroxide-simeth (MAALOX/MYLANTA) 200-200-20 MG/5ML suspension Take 15 mLs by mouth every 6 (six) hours as needed for indigestion or heartburn. 355 mL 0 unknown   amLODipine (NORVASC) 10 MG tablet Take 1 tablet (10 mg total) by mouth daily. 30 tablet 2 Past Week   apixaban (ELIQUIS) 5 MG TABS tablet Take 5 mg by mouth 2 (two) times daily.   Past Week   aspirin 81 MG EC tablet Take 1 tablet (81 mg total) by mouth daily. 30 tablet 0 Past Week   atorvastatin (LIPITOR) 80 MG tablet Take 1 tablet (80 mg total) by mouth daily at 6 PM. (Patient taking differently: Take 80 mg by mouth daily.) 30 tablet 0 Past Week   clopidogrel (PLAVIX) 75 MG tablet Take 75 mg by mouth daily.   Past Week   diclofenac Sodium (VOLTAREN) 1 % GEL Rub a small grape sized dollop into the sore joints of hands up to 3 times daily as needed for pain. 150 g 1 unknown   docusate sodium (COLACE) 100 MG capsule Take 100 mg by mouth daily.   Past Week   furosemide (LASIX) 20 MG tablet Take 1 tablet (20 mg total) by mouth daily.  30 tablet 0 Past Week   gabapentin (NEURONTIN) 300 MG capsule TAKE 1 CAPSULE BY MOUTH 3 TIMES A DAY (Patient taking differently: Take 300 mg by mouth 3 (three) times daily.) 90 capsule 6 Past Week   insulin aspart (NOVOLOG FLEXPEN) 100 UNIT/ML FlexPen Inject 20 Units into the skin 3 (three) times daily with meals. 15 mL 11 Past Week   insulin degludec (TRESIBA FLEXTOUCH) 200 UNIT/ML FlexTouch Pen Inject 100 Units into the skin daily. 15 mL 11 Past Week   metoprolol tartrate (LOPRESSOR) 25 MG tablet Take 1 tablet (25 mg total) by mouth 2 (two) times daily. 60 tablet 0 Past Week   nitroGLYCERIN (NITROSTAT) 0.4 MG SL tablet Place 1 tablet (0.4 mg total) under the tongue every 5 (five) minutes as needed for chest pain. 30 tablet 0 unknown   ONETOUCH VERIO test strip Use to test blood sugar twice daily  4    pantoprazole (PROTONIX) 40 MG tablet Take 1 tablet (40 mg total) by mouth 2 (two) times daily before a meal. 60 tablet 0 Past Week   promethazine (PHENERGAN) 25 MG tablet TAKE 1 TO 2 TABLETS BY MOUTH EVERY 4 TO 6 HOURS AS NEEDED FOR NAUSEA (Patient taking differently: Take 25-50 mg by mouth every 6 (six) hours as needed for nausea.) 240 tablet 1 unknown   traMADol (ULTRAM)  50 MG tablet Take 2 tablets (100 mg total) by mouth every 6 (six) hours as needed for moderate pain. 30 tablet 1 unknown   promethazine (PHENERGAN) 25 MG/ML injection Inject 0.5 mLs (12.5 mg total) into the muscle every 6 (six) hours as needed for nausea or vomiting. (Patient not taking: No sig reported) 25 mL 11 Not Taking   ranolazine (RANEXA) 500 MG 12 hr tablet Take 1 tablet (500 mg total) by mouth 2 (two) times daily. (Patient not taking: No sig reported) 60 tablet 0 Not Taking   Scheduled:   amiodarone  200 mg Per Tube BID   chlorhexidine gluconate (MEDLINE KIT)  15 mL Mouth Rinse BID   Chlorhexidine Gluconate Cloth  6 each Topical Daily   clotrimazole   Topical BID   docusate  100 mg Per Tube BID   feeding supplement  (PROSource TF)  45 mL Per Tube Daily   fentaNYL (SUBLIMAZE) injection  25 mcg Intravenous Once   insulin aspart  0-20 Units Subcutaneous Q4H   insulin aspart  8 Units Subcutaneous Q4H   insulin glargine  35 Units Subcutaneous BID   mouth rinse  15 mL Mouth Rinse 10 times per day   pantoprazole (PROTONIX) IV  40 mg Intravenous Q24H   polyethylene glycol  17 g Per Tube Daily   sodium chloride flush  10-40 mL Intracatheter Q12H   Infusions:   DAPTOmycin (CUBICIN)  IV Stopped (12/10/20 2101)   dexmedetomidine (PRECEDEX) IV infusion 0.2 mcg/kg/hr (12/11/20 0700)   feeding supplement (OSMOLITE 1.2 CAL) 1,000 mL (12/11/20 0600)   fentaNYL infusion INTRAVENOUS 150 mcg/hr (12/11/20 0700)   heparin 1,500 Units/hr (12/11/20 0700)   lactated ringers 75 mL/hr at 12/11/20 0700   norepinephrine (LEVOPHED) Adult infusion Stopped (12/10/20 0100)    Assessment: Pt was admitted for DKA. He has been on apixaban PTA for a hx PE? Last dose in the past week? Heparin has been ordered here for new afib. Baseline PTT 28, HL undetectable.   Heparin level continues to be at goal 0.4, on drip rate 1500 units/hr. CBC wnl. No overt bleeding or infusion issues noted.   Goal of Therapy:  Heparin level 0.3-0.7 units/ml Monitor platelets by anticoagulation protocol: Yes   Plan:  Continue heparin infusion at 1500 units/hr Monitor daily HL, CBC, s/sx bleeding  Alanda Slim, PharmD, [facility] Clinical Pharmacist Please see AMION for all Pharmacists' Contact Phone Numbers 12/11/2020, 7:18 AM

## 2020-12-12 ENCOUNTER — Inpatient Hospital Stay (HOSPITAL_COMMUNITY): Payer: HMO

## 2020-12-12 LAB — BASIC METABOLIC PANEL
Anion gap: 6 (ref 5–15)
BUN: 30 mg/dL — ABNORMAL HIGH (ref 8–23)
CO2: 25 mmol/L (ref 22–32)
Calcium: 7.9 mg/dL — ABNORMAL LOW (ref 8.9–10.3)
Chloride: 114 mmol/L — ABNORMAL HIGH (ref 98–111)
Creatinine, Ser: 0.96 mg/dL (ref 0.61–1.24)
GFR, Estimated: >60 mL/min (ref >60)
Glucose, Bld: 220 mg/dL — ABNORMAL HIGH (ref 70–99)
Potassium: 3.6 mmol/L (ref 3.5–5.1)
Sodium: 145 mmol/L (ref 135–145)

## 2020-12-12 LAB — GLUCOSE, CAPILLARY
Glucose-Capillary: 205 mg/dL — ABNORMAL HIGH (ref 70–99)
Glucose-Capillary: 155 mg/dL — ABNORMAL HIGH (ref 70–99)
Glucose-Capillary: 152 mg/dL — ABNORMAL HIGH (ref 70–99)
Glucose-Capillary: 134 mg/dL — ABNORMAL HIGH (ref 70–99)
Glucose-Capillary: 136 mg/dL — ABNORMAL HIGH (ref 70–99)
Glucose-Capillary: 128 mg/dL — ABNORMAL HIGH (ref 70–99)

## 2020-12-12 LAB — MAGNESIUM: Magnesium: 2.3 mg/dL (ref 1.7–2.4)

## 2020-12-12 LAB — CBC
HCT: 31.5 % — ABNORMAL LOW (ref 39.0–52.0)
Hemoglobin: 10.8 g/dL — ABNORMAL LOW (ref 13.0–17.0)
MCH: 30.4 pg (ref 26.0–34.0)
MCHC: 34.3 g/dL (ref 30.0–36.0)
MCV: 88.7 fL (ref 80.0–100.0)
Platelets: 151 10*3/uL (ref 150–400)
RBC: 3.55 MIL/uL — ABNORMAL LOW (ref 4.22–5.81)
RDW: 15.5 % (ref 11.5–15.5)
WBC: 13.7 10*3/uL — ABNORMAL HIGH (ref 4.0–10.5)
nRBC: 0.2 % (ref 0.0–0.2)

## 2020-12-12 LAB — CK: Total CK: 966 U/L — ABNORMAL HIGH (ref 49–397)

## 2020-12-12 LAB — CULTURE, BLOOD (ROUTINE X 2): Special Requests: ADEQUATE

## 2020-12-12 LAB — PHOSPHORUS: Phosphorus: <1 mg/dL — CL (ref 2.5–4.6)

## 2020-12-12 LAB — HEPARIN LEVEL (UNFRACTIONATED): Heparin Unfractionated: <0.1 IU/mL — ABNORMAL LOW (ref 0.30–0.70)

## 2020-12-12 MED ORDER — POTASSIUM PHOSPHATES 15 MMOLE/5ML IV SOLN
30.0000 mmol | Freq: Once | INTRAVENOUS | Status: DC
  Filled 2020-12-12: qty 10

## 2020-12-12 MED ORDER — INSULIN ASPART 100 UNIT/ML IJ SOLN
8.0000 [IU] | INTRAMUSCULAR | Status: DC
  Administered 2020-12-12 – 2020-12-17 (×22): 8 [IU] via SUBCUTANEOUS

## 2020-12-12 NOTE — Progress Notes (Signed)
[facility]   Reason for visit: Follow up on bacteremia  Interval History: remains intubated, WBC 13.7, afebrile.   Blood cultures 12/10/20 NGTD x 2 sets                         6/23: 1) Staph aureus, oxicillin sensitive + Staph epidermidis, methicillin resistant                                  2) 1 bottle Staph hominis, Staph epidermidis   Physical Exam: Constitutional:  Vitals:   12/12/20 0800 12/12/20 0823  BP: (!) 146/56 (!) 135/53  Pulse: 95   Resp: 19 (!) 22  Temp: 99.5 F (37.5 C)   SpO2: 99%   intubated Eyes: eyes open HENT: +ET Respiratory: respiratory effort on vent;  Cardiovascular: RRR Neuro: alert, eyes open, follows commands  Review of Systems: Unable to be assessed due to patient factors  Lab Results  Component Value Date   WBC 13.7 (H) 12/12/2020   HGB 10.8 (L) 12/12/2020   HCT 31.5 (L) 12/12/2020   MCV 88.7 12/12/2020   PLT 151 12/12/2020    Lab Results  Component Value Date   CREATININE 0.96 12/12/2020   BUN 30 (H) 12/12/2020   NA 145 12/12/2020   K 3.6 12/12/2020   CL 114 (H) 12/12/2020   CO2 25 12/12/2020    Lab Results  Component Value Date   ALT 18 11/27/2020   AST 29 11/17/2020   ALKPHOS 72 12/14/2020     Microbiology: Recent Results (from the past 240 hour(s))  Culture, blood (Routine x 2)     Status: Abnormal (Preliminary result)   Collection Time: 12/03/2020  2:03 PM   Specimen: BLOOD  Result Value Ref Range Status   Specimen Description BLOOD LEFT ANTECUBITAL  Final   Special Requests   Final    BOTTLES DRAWN AEROBIC AND ANAEROBIC Blood Culture results may not be optimal due to an inadequate volume of blood received in culture bottles   Culture  Setup Time   Final    GRAM POSITIVE COCCI IN CLUSTERS AEROBIC BOTTLE ONLY CRITICAL VALUE NOTED.  VALUE IS CONSISTENT WITH PREVIOUSLY REPORTED AND CALLED VALUE.    Culture (A)  Final    STAPHYLOCOCCUS HOMINIS STAPHYLOCOCCUS  EPIDERMIDIS SUSCEPTIBILITIES PERFORMED ON PREVIOUS CULTURE WITHIN THE LAST 5 DAYS. Performed at [facility], [address], [place], [place] [address]    Report Status PENDING  Incomplete  Culture, blood (Routine x 2)     Status: Abnormal   Collection Time: 12/07/2020  2:31 PM   Specimen: BLOOD  Result Value Ref Range Status   Specimen Description BLOOD LEFT ANTECUBITAL  Final   Special Requests   Final    BOTTLES DRAWN AEROBIC AND ANAEROBIC Blood Culture adequate volume   Culture  Setup Time   Final    GRAM POSITIVE COCCI IN CLUSTERS IN BOTH AEROBIC AND ANAEROBIC BOTTLES CRITICAL RESULT CALLED TO, READ BACK BY AND VERIFIED WITH: PHARMD J.FRENS AT 0818 ON 12/09/2020 BY T.SAAD. Performed at [facility], [address], [place], [place] [address]    Culture (A)  Final    STAPHYLOCOCCUS AUREUS STAPHYLOCOCCUS EPIDERMIDIS    Report Status 12/12/2020 FINAL  Final   Organism ID, Bacteria STAPHYLOCOCCUS AUREUS  Final   Organism ID, Bacteria STAPHYLOCOCCUS EPIDERMIDIS  Final  Susceptibility   Staphylococcus aureus - MIC*    CIPROFLOXACIN <=0.5 SENSITIVE Sensitive     ERYTHROMYCIN <=0.25 SENSITIVE Sensitive     GENTAMICIN <=0.5 SENSITIVE Sensitive     OXACILLIN 0.5 SENSITIVE Sensitive     TETRACYCLINE <=1 SENSITIVE Sensitive     VANCOMYCIN <=0.5 SENSITIVE Sensitive     TRIMETH/SULFA <=10 SENSITIVE Sensitive     CLINDAMYCIN <=0.25 SENSITIVE Sensitive     RIFAMPIN <=0.5 SENSITIVE Sensitive     Inducible Clindamycin NEGATIVE Sensitive     * STAPHYLOCOCCUS AUREUS   Staphylococcus epidermidis - MIC*    CIPROFLOXACIN <=0.5 SENSITIVE Sensitive     ERYTHROMYCIN <=0.25 SENSITIVE Sensitive     GENTAMICIN <=0.5 SENSITIVE Sensitive     OXACILLIN >=4 RESISTANT Resistant     TETRACYCLINE <=1 SENSITIVE Sensitive     VANCOMYCIN 1 SENSITIVE Sensitive     TRIMETH/SULFA <=10 SENSITIVE Sensitive     CLINDAMYCIN <=0.25 SENSITIVE Sensitive     RIFAMPIN <=0.5 SENSITIVE Sensitive      Inducible Clindamycin NEGATIVE Sensitive     * STAPHYLOCOCCUS EPIDERMIDIS  Blood Culture ID Panel (Reflexed)     Status: Abnormal   Collection Time: 12/15/2020  2:31 PM  Result Value Ref Range Status   Enterococcus faecalis NOT DETECTED NOT DETECTED Final   Enterococcus Faecium NOT DETECTED NOT DETECTED Final   Listeria monocytogenes NOT DETECTED NOT DETECTED Final   Staphylococcus species DETECTED (A) NOT DETECTED Final    Comment: CRITICAL RESULT CALLED TO, READ BACK BY AND VERIFIED WITH: PHARMD J.FRENS AT 0818 ON 12/09/2020 BY T.SAAD.    Staphylococcus aureus (BCID) DETECTED (A) NOT DETECTED Final    Comment: CRITICAL RESULT CALLED TO, READ BACK BY AND VERIFIED WITH: PHARMD J.FRENS AT 0818 ON 12/09/2020 BY T.SAAD.    Staphylococcus epidermidis DETECTED (A) NOT DETECTED Final    Comment: CRITICAL RESULT CALLED TO, READ BACK BY AND VERIFIED WITH: PHARMD J.FRENS AT 0818 ON 12/09/2020 BY T.SAAD.    Staphylococcus lugdunensis NOT DETECTED NOT DETECTED Final   Streptococcus species NOT DETECTED NOT DETECTED Final   Streptococcus agalactiae NOT DETECTED NOT DETECTED Final   Streptococcus pneumoniae NOT DETECTED NOT DETECTED Final   Streptococcus pyogenes NOT DETECTED NOT DETECTED Final   A.calcoaceticus-baumannii NOT DETECTED NOT DETECTED Final   Bacteroides fragilis NOT DETECTED NOT DETECTED Final   Enterobacterales NOT DETECTED NOT DETECTED Final   Enterobacter cloacae complex NOT DETECTED NOT DETECTED Final   Escherichia coli NOT DETECTED NOT DETECTED Final   Klebsiella aerogenes NOT DETECTED NOT DETECTED Final   Klebsiella oxytoca NOT DETECTED NOT DETECTED Final   Klebsiella pneumoniae NOT DETECTED NOT DETECTED Final   Proteus species NOT DETECTED NOT DETECTED Final   Salmonella species NOT DETECTED NOT DETECTED Final   Serratia marcescens NOT DETECTED NOT DETECTED Final   Haemophilus influenzae NOT DETECTED NOT DETECTED Final   Neisseria meningitidis NOT DETECTED NOT DETECTED  Final   Pseudomonas aeruginosa NOT DETECTED NOT DETECTED Final   Stenotrophomonas maltophilia NOT DETECTED NOT DETECTED Final   Candida albicans NOT DETECTED NOT DETECTED Final   Candida auris NOT DETECTED NOT DETECTED Final   Candida glabrata NOT DETECTED NOT DETECTED Final   Candida krusei NOT DETECTED NOT DETECTED Final   Candida parapsilosis NOT DETECTED NOT DETECTED Final   Candida tropicalis NOT DETECTED NOT DETECTED Final   Cryptococcus neoformans/gattii NOT DETECTED NOT DETECTED Final   Methicillin resistance mecA/C DETECTED (A) NOT DETECTED Final    Comment: CRITICAL RESULT CALLED TO, READ BACK BY  AND VERIFIED WITH: PHARMD J.FRENS AT 0818 ON 12/09/2020 BY T.SAAD.    Meth resistant mecA/C and MREJ NOT DETECTED NOT DETECTED Final    Comment: Performed at [facility], [address], [place], [place] [address]  Resp Panel by RT-PCR (Flu A&B, Covid) Nasopharyngeal Swab     Status: None   Collection Time: 11-Dec-2020  5:38 PM   Specimen: Nasopharyngeal Swab; Nasopharyngeal(NP) swabs in vial transport medium  Result Value Ref Range Status   SARS Coronavirus 2 by RT PCR NEGATIVE NEGATIVE Final    Comment: (NOTE) SARS-CoV-2 target nucleic acids are NOT DETECTED.  The SARS-CoV-2 RNA is generally detectable in upper respiratory specimens during the acute phase of infection. The lowest concentration of SARS-CoV-2 viral copies this assay can detect is 138 copies/mL. A negative result does not preclude SARS-Cov-2 infection and should not be used as the sole basis for treatment or other patient management decisions. A negative result may occur with  improper specimen collection/handling, submission of specimen other than nasopharyngeal swab, presence of viral mutation(s) within the areas targeted by this assay, and inadequate number of viral copies(<138 copies/mL). A negative result must be combined with clinical observations, patient history, and epidemiological information. The  expected result is Negative.  Fact Sheet for Patients:  BloggerCourse.com  Fact Sheet for Healthcare Providers:  SeriousBroker.it  This test is no t yet approved or cleared by the [place] FDA and  has been authorized for detection and/or diagnosis of SARS-CoV-2 by FDA under an Emergency Use Authorization (EUA). This EUA will remain  in effect (meaning this test can be used) for the duration of the COVID-19 declaration under Section 564(b)(1) of the Act, 21 U.S.C.section 360bbb-3(b)(1), unless the authorization is terminated  or revoked sooner.       Influenza A by PCR NEGATIVE NEGATIVE Final   Influenza B by PCR NEGATIVE NEGATIVE Final    Comment: (NOTE) The Xpert Xpress SARS-CoV-2/FLU/RSV plus assay is intended as an aid in the diagnosis of influenza from Nasopharyngeal swab specimens and should not be used as a sole basis for treatment. Nasal washings and aspirates are unacceptable for Xpert Xpress SARS-CoV-2/FLU/RSV testing.  Fact Sheet for Patients: BloggerCourse.com  Fact Sheet for Healthcare Providers: SeriousBroker.it  This test is not yet approved or cleared by the [place] FDA and has been authorized for detection and/or diagnosis of SARS-CoV-2 by FDA under an Emergency Use Authorization (EUA). This EUA will remain in effect (meaning this test can be used) for the duration of the COVID-19 declaration under Section 564(b)(1) of the Act, 21 U.S.C. section 360bbb-3(b)(1), unless the authorization is terminated or revoked.  Performed at [facility], [address], [place], [place] [address]   MRSA Next Gen by PCR, Nasal     Status: None   Collection Time: Dec 11, 2020  7:45 PM   Specimen: Nasal Mucosa; Nasal Swab  Result Value Ref Range Status   MRSA by PCR Next Gen NOT DETECTED NOT DETECTED Final    Comment: (NOTE) The GeneXpert MRSA Assay (FDA  approved for NASAL specimens only), is one component of a comprehensive MRSA colonization surveillance program. It is not intended to diagnose MRSA infection nor to guide or monitor treatment for MRSA infections. Test performance is not FDA approved in patients less than 51 years old. Performed at [facility], [address], [place], [place] [address]   Culture, blood (routine x 2)     Status: None (Preliminary result)   Collection  Time: 12/10/20  7:49 AM   Specimen: BLOOD RIGHT HAND  Result Value Ref Range Status   Specimen Description BLOOD RIGHT HAND  Final   Special Requests   Final    BOTTLES DRAWN AEROBIC ONLY Blood Culture adequate volume   Culture   Final    NO GROWTH 2 DAYS Performed at [facility], [address] [place], [place] [address]    Report Status PENDING  Incomplete  Culture, blood (routine x 2)     Status: None (Preliminary result)   Collection Time: 12/10/20  7:54 AM   Specimen: BLOOD RIGHT HAND  Result Value Ref Range Status   Specimen Description BLOOD RIGHT HAND  Final   Special Requests   Final    BOTTLES DRAWN AEROBIC ONLY Blood Culture adequate volume   Culture   Final    NO GROWTH 2 DAYS Performed at [facility], [address], [place], [place] [address]    Report Status PENDING  Incomplete  MRSA Next Gen by PCR, Nasal     Status: None   Collection Time: 12/11/20  9:26 AM   Specimen: Nasal Mucosa; Nasal Swab  Result Value Ref Range Status   MRSA by PCR Next Gen NOT DETECTED NOT DETECTED Final    Comment: (NOTE) The GeneXpert MRSA Assay (FDA approved for NASAL specimens only), is one component of a comprehensive MRSA colonization surveillance program. It is not intended to diagnose MRSA infection nor to guide or monitor treatment for MRSA infections. Test performance is not FDA approved in patients less than 63 years old. Performed at [facility], [address], [place], [place] [address]     Impression/Plan:   1. Bacteremia - he has multiple bacterial organisms and on daptomycin to cover each one.  Repeat cultures with no growth to date.  TTE without vegetation.  For TEE today while intubated.    2.  Medication monitoring - CK level noted and up to 966 today.  Will continue with daptomycin for above and monitor closely  3.  Fever - has remained afebrile > 24 hours.  Will continue to monitor

## 2020-12-12 NOTE — Progress Notes (Signed)
Pharmacy Antibiotic Note  Samuel [name] is a 70 y.o. male admitted on 12/30/20 with suspected MSSA/MRSE bacteremia likely secondary to LE wounds.   Pharmacy has been consulted for daptomycin dosing Scr now trended back down to baseline of <1 from previous AKI. Noted CK's have been elevated - Today 966- This is likely due to overall clinical picture.   Plan: Daptomycin 850 mg every 24 hours ( ~ 8 mg/kg)  CK daily for now  Can go up to 5 X ULN ( ~ 2000) safely while on daptomycin  Monitor renal function, blood cultures, TEE  Height: 6\' 3"  (190.5 cm) Weight: 109.2 kg (240 lb 11.9 oz) IBW/kg (Calculated) : 84.5  Temp (24hrs), Avg:99.7 F (37.6 C), Min:99.14 F (37.3 C), Max:100.04 F (37.8 C)  Recent Labs  Lab 12/30/2020 1403 30-Dec-2020 1444 2020-12-30 2039 12/30/2020 2200 12/09/20 0200 12/09/20 0730 12/09/20 1512 12/09/20 2158 12/10/20 0754 12/11/20 0430 12/12/20 0434  WBC 10.2  --   --   --  8.2  --   --   --   --  12.8* 13.7*  CREATININE 4.01*   < > 0.45*   < > 3.23*   < > 1.90* 1.53* 1.18 1.20 0.96  LATICACIDVEN 2.3*  --  0.8  --   --   --   --   --   --   --   --    < > = values in this interval not displayed.     Estimated Creatinine Clearance: 99.7 mL/min (by C-G formula based on SCr of 0.96 mg/dL).    Allergies  Allergen Reactions   Gabapentin Hives, Itching, Nausea And Vomiting and Rash    REACTION: rash/hives (per patient, was a reaction to an inactive ingredient in another mgf brand). The patient stated that he does take this medication now and it doesn't cause a rash any more. LOW DOSE   Relafen [Nabumetone] Nausea And Vomiting and Rash   Codeine Nausea And Vomiting and Rash      Thank you for allowing pharmacy to be a part of this patient's care.  12/14/20, PharmD, BCPS, BCIDP Infectious Diseases Clinical Pharmacist Phone: [phone] 12/12/2020 12:26 PM

## 2020-12-12 NOTE — Progress Notes (Signed)
Pharmacy Electrolyte Replacement  Recent Labs:  Recent Labs    12/12/20 0434  K 3.6  MG 2.3  PHOS <1.0*  CREATININE 0.96    Low Critical Values (K </= 2.5, Phos </= 1, Mg </= 1) Present: Phos = <1.0  Plan:   Give Kphos 30 mmol IV x 1  Jeanella Cara, PharmD, [place] Clinical Pharmacist Please see AMION for all Pharmacists' Contact Phone Numbers 12/12/2020, 7:09 AM

## 2020-12-12 NOTE — Progress Notes (Signed)
NAME:  Samuel [name], MRN:  [id number], DOB:  Jan 17, 1951, LOS: 4 ADMISSION DATE:  2020/12/30, CONSULTATION DATE:  2020-12-30 CHIEF COMPLAINT:  DKA   History of Present Illness:  Patient presents with N/V, obtunded. Unable to provide history or get ahold of family. History obtained from chart review and per report.  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 He had 3 days of increased vomiting, per family.  Pertinent  Medical History  IDT2DM, CAD, HTN, h/o MRSA sepsis, h/o osteomyelitis, h/o PE, s/p left BKA  Significant Hospital Events: Including procedures, antibiotic start and stop dates in addition to other pertinent events   - 6/23 admitted - 6/24 worsening secretion burden, intubated   Interim History / Subjective:  6/27: overnight with ett bleeding and oral bleeding, heparin infusion stopped accordingly. On wean 8/5. Will cont with trial. Need to clarify for need of tee with cultures that were subsequently negative after 1 set positive and no valvular abnormalities on tte.   Objective   Blood pressure (!) 135/53, pulse 95, temperature 99.5 F (37.5 C), temperature source Bladder, resp. rate (!) 22, height 6\' 3"  (1.905 m), weight 109.2 kg, SpO2 99 %.    Vent Mode: CPAP;PSV FiO2 (%):  [40 %-50 %] 40 % Set Rate:  [16 bmp] 16 bmp Vt Set:  [600 mL] 600 mL PEEP:  [5 cmH20] 5 cmH20 Pressure Support:  [8 cmH20] 8 cmH20 Plateau Pressure:  [18 cmH20] 18 cmH20   Intake/Output Summary (Last 24 hours) at 12/12/2020 1015 Last data filed at 12/12/2020 0800 Gross per 24 hour  Intake 2973.42 ml  Output 1730 ml  Net 1243.42 ml   Filed Weights   12/10/20 0453 12/11/20 0500 12/12/20 0500  Weight: 104.8 kg 105 kg 109.2 kg    Examination: Constitutional: drowsy on vent, nodding seemingly appropriately Eyes: pupils small, reactive, sclera injected Ears, nose, mouth, and throat: ETT in place, poor dentition, small to moderate bloody secretions Cardiovascular: RRR, ext warm, faint  SEM Respiratory: clear, triggers vent Gastrointestinal: soft, +BS Skin: RLE wrapped, amputated digits Neurologic: nods and shakes head seemingly appropriately. Grossly weak and diffusely edematous.  Psychiatric: will assess as sedation is weaned     Labs/imaging that I have personally reviewed  (right click and "Reselect all SmartList Selections" daily)  CK improved Renal function stable CBGs up slightly on current regimen  Resolved Hospital Problem list   DKA in patient with poorly controlled Type 2 DM resolved  Assessment & Plan:    Severe sepsis secondary to RLE cellulitis with bacteremia: MRSE and MSSA; echo w/o obvious endocarditis -blood cultures cleared -remains on abx.  -ID following -monitor CPK -will need to clarify need for TEE prior to extubation.  DM with hyperglycemia- A1c 6/23 7.0, on 100 units insulin at home -will resume tf dosing but at reduced dose Obesity Hx CAD Hx OSA noncompliant CPAP Septic and hypovolemic shock resolved Afib converted with amio -remains in sinus -holding heparin gtt today with oral and ett bleeding.  Metabolic encephalopathy improving -able to nod but very weak.  Acute hypoxemic respiratory failure due to inability to handle secretions from his encephalopathy -cont cpap as tolerated hopefully will be able to reduce to 5/5 but is greatly debilitated and weak at this time so secretion management remains concern.     Best practice:  Diet: TF Pain/Anxiety/Delirium protocol (if indicated): fent VAP protocol (if indicated): in place DVT prophylaxis: heparin gtt: on hold GI prophylaxis: PPI Glucose control: endotool Mobility: BR Code Status:  full Family Communication: called 6/25 and updated Disposition: ICU pending vent liberation  Critical care time: The patient is critically ill with multiple organ systems failure and requires high complexity decision making for assessment and support, frequent evaluation and titration of  therapies, application of advanced monitoring technologies and extensive interpretation of multiple databases.  Critical care time 39 mins. This represents my time independent of the NPs time taking care of the pt. This is excluding procedures.    Briant Sites DO [facility] 12/12/2020, 12:26 PM See Amion for pager If no response to pager, please call [phone] until 1900 After 1900 please call [facility] [phone]

## 2020-12-13 ENCOUNTER — Inpatient Hospital Stay (HOSPITAL_COMMUNITY): Payer: HMO

## 2020-12-13 DIAGNOSIS — I340 Nonrheumatic mitral (valve) insufficiency: Secondary | ICD-10-CM

## 2020-12-13 LAB — CBC
HCT: 30.4 % — ABNORMAL LOW (ref 39.0–52.0)
Hemoglobin: 10.4 g/dL — ABNORMAL LOW (ref 13.0–17.0)
MCH: 30.8 pg (ref 26.0–34.0)
MCHC: 34.2 g/dL (ref 30.0–36.0)
MCV: 89.9 fL (ref 80.0–100.0)
Platelets: 139 10*3/uL — ABNORMAL LOW (ref 150–400)
RBC: 3.38 MIL/uL — ABNORMAL LOW (ref 4.22–5.81)
RDW: 16.3 % — ABNORMAL HIGH (ref 11.5–15.5)
WBC: 11 10*3/uL — ABNORMAL HIGH (ref 4.0–10.5)
nRBC: 0 % (ref 0.0–0.2)

## 2020-12-13 LAB — PHOSPHORUS: Phosphorus: 1.8 mg/dL — ABNORMAL LOW (ref 2.5–4.6)

## 2020-12-13 LAB — BASIC METABOLIC PANEL
Anion gap: 6 (ref 5–15)
BUN: 19 mg/dL (ref 8–23)
CO2: 24 mmol/L (ref 22–32)
Calcium: 6.9 mg/dL — ABNORMAL LOW (ref 8.9–10.3)
Chloride: 118 mmol/L — ABNORMAL HIGH (ref 98–111)
Creatinine, Ser: 0.85 mg/dL (ref 0.61–1.24)
GFR, Estimated: >60 mL/min (ref >60)
Glucose, Bld: 99 mg/dL (ref 70–99)
Potassium: 3.2 mmol/L — ABNORMAL LOW (ref 3.5–5.1)
Sodium: 148 mmol/L — ABNORMAL HIGH (ref 135–145)

## 2020-12-13 LAB — CULTURE, BLOOD (ROUTINE X 2)

## 2020-12-13 LAB — GLUCOSE, CAPILLARY
Glucose-Capillary: 103 mg/dL — ABNORMAL HIGH (ref 70–99)
Glucose-Capillary: 78 mg/dL (ref 70–99)
Glucose-Capillary: 122 mg/dL — ABNORMAL HIGH (ref 70–99)
Glucose-Capillary: 144 mg/dL — ABNORMAL HIGH (ref 70–99)
Glucose-Capillary: 129 mg/dL — ABNORMAL HIGH (ref 70–99)
Glucose-Capillary: 95 mg/dL (ref 70–99)

## 2020-12-13 LAB — HEPARIN LEVEL (UNFRACTIONATED): Heparin Unfractionated: 0.1 IU/mL — ABNORMAL LOW (ref 0.30–0.70)

## 2020-12-13 LAB — CK: Total CK: 319 U/L (ref 49–397)

## 2020-12-13 LAB — MAGNESIUM: Magnesium: 1.7 mg/dL (ref 1.7–2.4)

## 2020-12-13 MED ORDER — PROSOURCE TF PO LIQD
45.0000 mL | Freq: Two times a day (BID) | ORAL | Status: DC
  Administered 2020-12-14 – 2020-12-17 (×8): 45 mL
  Filled 2020-12-13 (×8): qty 45

## 2020-12-13 MED ORDER — MIDAZOLAM HCL 2 MG/2ML IJ SOLN
2.0000 mg | Freq: Once | INTRAMUSCULAR | Status: AC
  Administered 2020-12-13: 2 mg via INTRAVENOUS
  Filled 2020-12-13: qty 2

## 2020-12-13 MED ORDER — VITAL 1.5 CAL PO LIQD
1000.0000 mL | ORAL | Status: DC
  Administered 2020-12-15 – 2020-12-17 (×3): 1000 mL

## 2020-12-13 MED ORDER — HEPARIN (PORCINE) 25000 UT/250ML-% IV SOLN
2050.0000 [IU]/h | INTRAVENOUS | Status: DC
  Administered 2020-12-13: 1500 [IU]/h via INTRAVENOUS
  Administered 2020-12-14: 2050 [IU]/h via INTRAVENOUS
  Administered 2020-12-14: 1800 [IU]/h via INTRAVENOUS
  Filled 2020-12-13 (×3): qty 250

## 2020-12-13 MED ORDER — INSULIN GLARGINE 100 UNIT/ML ~~LOC~~ SOLN
35.0000 [IU] | Freq: Two times a day (BID) | SUBCUTANEOUS | Status: DC
  Filled 2020-12-13: qty 0.35

## 2020-12-13 MED ORDER — PANTOPRAZOLE SODIUM 40 MG PO PACK
40.0000 mg | PACK | Freq: Every day | ORAL | Status: DC
  Administered 2020-12-14 – 2020-12-17 (×4): 40 mg
  Filled 2020-12-13 (×4): qty 20

## 2020-12-13 MED ORDER — INSULIN GLARGINE 100 UNIT/ML ~~LOC~~ SOLN
35.0000 [IU] | Freq: Two times a day (BID) | SUBCUTANEOUS | Status: DC
  Administered 2020-12-13 – 2020-12-17 (×6): 35 [IU] via SUBCUTANEOUS
  Filled 2020-12-13 (×11): qty 0.35

## 2020-12-13 MED ORDER — ACETAMINOPHEN 160 MG/5ML PO SOLN
650.0000 mg | Freq: Four times a day (QID) | ORAL | Status: DC | PRN
  Administered 2020-12-13: 650 mg via ORAL
  Filled 2020-12-13: qty 20.3

## 2020-12-13 MED ORDER — MAGNESIUM SULFATE 2 GM/50ML IV SOLN
2.0000 g | Freq: Once | INTRAVENOUS | Status: AC
  Administered 2020-12-13: 2 g via INTRAVENOUS
  Filled 2020-12-13: qty 50

## 2020-12-13 NOTE — Progress Notes (Signed)
Attempt update to brother via both numbers listed in chart x2   No answer.

## 2020-12-13 NOTE — Progress Notes (Signed)
[place] for heparin Indication: atrial fibrillation  Allergies  Allergen Reactions   Gabapentin Hives, Itching, Nausea And Vomiting and Rash    REACTION: rash/hives (per patient, was a reaction to an inactive ingredient in another mgf brand). The patient stated that he does take this medication now and it doesn't cause a rash any more. LOW DOSE   Relafen [Nabumetone] Nausea And Vomiting and Rash   Codeine Nausea And Vomiting and Rash    Patient Measurements: Height: _0  (190.5 cm) Weight: 110.6 kg (243 lb 13.3 oz) IBW/kg (Calculated) : 84.5 Heparin Dosing Weight: 105kg  Vital Signs: Temp: 100.94 F (38.3 C) (06/28 0900) Temp Source: Bladder (06/28 0400) BP: 133/55 (06/28 1115) Pulse Rate: 96 (06/28 1115)  Labs: Recent Labs    12/10/20 1657 12/11/20 0430 12/11/20 0430 12/11/20 0440 12/12/20 0434 12/13/20 0440 12/13/20 0541  HGB  --  11.5*   < >  --  10.8*  --  10.4*  HCT  --  33.3*  --   --  31.5*  --  30.4*  PLT  --  125*  --   --  151  --  139*  HEPARINUNFRC 0.56  --   --  0.40 <0.10*  --   --   CREATININE  --  1.20  --   --  0.96 0.85  --   CKTOTAL  --  755*  --   --  966* 319  --    < > = values in this interval not displayed.    Estimated Creatinine Clearance: 113.2 mL/min (by C-G formula based on SCr of 0.85 mg/dL).   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 of 2012   Shortness of breath    exertion   Type II diabetes mellitus (HCC)     Medications:  Medications Prior to Admission  Medication Sig Dispense Refill Last Dose   acarbose (PRECOSE) 50 MG tablet Take 50 mg by mouth 3 (three) times daily with meals.   Past Week   alum & mag hydroxide-simeth (MAALOX/MYLANTA) 200-200-20 MG/5ML suspension Take 15 mLs by mouth every 6 (six) hours as needed for indigestion or heartburn. 355 mL 0 unknown   amLODipine (NORVASC) 10 MG tablet Take 1 tablet (10 mg total) by mouth daily. 30 tablet 2 Past Week   apixaban (ELIQUIS) 5 MG TABS tablet Take 5 mg by mouth 2 (two) times daily.   Past Week   aspirin 81 MG EC tablet Take 1 tablet (81 mg total) by mouth daily. 30 tablet 0 Past Week   atorvastatin (LIPITOR) 80 MG tablet Take 1 tablet (80 mg total) by mouth daily at 6 PM. (Patient taking differently: Take 80 mg by mouth daily.) 30 tablet 0 Past Week  clopidogrel (PLAVIX) 75 MG tablet Take 75 mg by mouth daily.   Past Week   diclofenac Sodium (VOLTAREN) 1 % GEL Rub a small grape sized dollop into the sore joints of hands up to 3 times daily as needed for pain. 150 g 1 unknown   docusate sodium (COLACE) 100 MG capsule Take 100 mg by mouth daily.   Past Week   furosemide (LASIX) 20 MG tablet Take 1 tablet (20 mg total) by mouth daily. 30 tablet 0 Past Week   gabapentin (NEURONTIN) 300 MG capsule TAKE 1 CAPSULE BY MOUTH 3 TIMES A DAY (Patient taking differently: Take 300 mg by mouth 3 (three) times daily.) 90 capsule 6 Past Week   insulin aspart (NOVOLOG FLEXPEN) 100 UNIT/ML FlexPen Inject 20 Units into the skin 3 (three) times daily with meals. 15 mL 11 Past Week   insulin degludec (TRESIBA FLEXTOUCH) 200 UNIT/ML FlexTouch Pen Inject 100 Units into the skin daily. 15 mL 11 Past Week   metoprolol tartrate (LOPRESSOR) 25 MG tablet Take 1 tablet (25 mg total) by mouth 2 (two) times daily. 60 tablet 0 Past Week   nitroGLYCERIN (NITROSTAT) 0.4 MG  SL tablet Place 1 tablet (0.4 mg total) under the tongue every 5 (five) minutes as needed for chest pain. 30 tablet 0 unknown   ONETOUCH VERIO test strip Use to test blood sugar twice daily  4    pantoprazole (PROTONIX) 40 MG tablet Take 1 tablet (40 mg total) by mouth 2 (two) times daily before a meal. 60 tablet 0 Past Week   promethazine (PHENERGAN) 25 MG tablet TAKE 1 TO 2 TABLETS BY MOUTH EVERY 4 TO 6 HOURS AS NEEDED FOR NAUSEA (Patient taking differently: Take 25-50 mg by mouth every 6 (six) hours as needed for nausea.) 240 tablet 1 unknown   traMADol (ULTRAM) 50 MG tablet Take 2 tablets (100 mg total) by mouth every 6 (six) hours as needed for moderate pain. 30 tablet 1 unknown   promethazine (PHENERGAN) 25 MG/ML injection Inject 0.5 mLs (12.5 mg total) into the muscle every 6 (six) hours as needed for nausea or vomiting. (Patient not taking: No sig reported) 25 mL 11 Not Taking   ranolazine (RANEXA) 500 MG 12 hr tablet Take 1 tablet (500 mg total) by mouth 2 (two) times daily. (Patient not taking: No sig reported) 60 tablet 0 Not Taking   Scheduled:   amiodarone  200 mg Per Tube BID   chlorhexidine gluconate (MEDLINE KIT)  15 mL Mouth Rinse BID   Chlorhexidine Gluconate Cloth  6 each Topical Daily   clotrimazole   Topical BID   docusate  100 mg Per Tube BID   feeding supplement (PROSource TF)  45 mL Per Tube Daily   fentaNYL (SUBLIMAZE) injection  25 mcg Intravenous Once   insulin aspart  0-20 Units Subcutaneous Q4H   insulin aspart  8 Units Subcutaneous Q4H   insulin glargine  35 Units Subcutaneous BID   mouth rinse  15 mL Mouth Rinse 10 times per day   midazolam  2 mg Intravenous Once   [START ON 12/14/2020] pantoprazole sodium  40 mg Per Tube Daily   polyethylene glycol  17 g Per Tube Daily   sodium chloride flush  10-40 mL Intracatheter Q12H   Infusions:   DAPTOmycin (CUBICIN)  IV Stopped (12/12/20 2137)   feeding supplement (OSMOLITE 1.2 CAL) 40 mL/hr at 12/12/20 1800    fentaNYL infusion INTRAVENOUS 175 mcg/hr (12/13/20 0800)   lactated ringers 75 mL/hr  at 12/13/20 0800   potassium PHOSPHATE IVPB (in mmol) Stopped (12/12/20 1601)    Assessment: Pt was admitted for DKA. He has been on apixaban PTA for a hx PE? Last dose in the past week? Heparin has been ordered here for new afibrillation. Baseline PTT 28, HL undetectable. Patient was on Heparin until 6/26 when started having bloody secretions per ETT. Still blood tinged but improved. Discussed with Dr. Ruthann Cancer today - ok to resume IV Heparin today after TEE performed.   Hgb 10.4 and Plts 130 - low but stable. Some pink tinged secretions noted but improved. AKI resolving - SCr has improved at 0.85 and good urine output reported. Patient was previously therapeutic on a rate of 1500 units/hr (~14 units/kg/hr using HDW).  Goal of Therapy:  Heparin level 0.3-0.7 units/ml Monitor platelets by anticoagulation protocol: Yes   Plan:  TEE now completed. Improved secretions noted - mostly tan with intermittent pink-tinge. Restart heparin infusion at 1500 units/hr No bolus due to recent bloody secretions Heparin level in 6 hours Monitor daily HL, CBC, s/sx bleeding  Sloan Leiter, PharmD, BCPS, BCCCP Clinical Pharmacist Please refer to [facility] for [place] numbers 12/13/2020, 2:56 PM

## 2020-12-13 NOTE — Progress Notes (Signed)
[facility]   Reason for visit: follow up on bacteremia  Interval History: remains intubated, WBC down to 11.0, Tmax 100.9.   Blood cultures 12/10/20 NGTD x 2 sets                         6/23: 1) Staph aureus, oxicillin sensitive + Staph epidermidis, methicillin resistant                                  2) 1 bottle Staph hominis, Staph epidermidis   Physical Exam: Constitutional:  Vitals:   12/13/20 0800 12/13/20 0900  BP: (!) 144/83 (!) 111/59  Pulse: (!) 116 100  Resp: (!) 24 16  Temp: (!) 100.4 F (38 C) (!) 100.94 F (38.3 C)  SpO2: 91% 91%  Intubated, sedated HENT: + ET Respiratory: respiratory effort on vent; CTA B Cardiovascular: RRR Skin: no rashes  Review of Systems: Unable to assess due to patient factors  Lab Results  Component Value Date   WBC 11.0 (H) 12/13/2020   HGB 10.4 (L) 12/13/2020   HCT 30.4 (L) 12/13/2020   MCV 89.9 12/13/2020   PLT 139 (L) 12/13/2020    Lab Results  Component Value Date   CREATININE 0.85 12/13/2020   BUN 19 12/13/2020   NA 148 (H) 12/13/2020   K 3.2 (L) 12/13/2020   CL 118 (H) 12/13/2020   CO2 24 12/13/2020    Lab Results  Component Value Date   ALT 18 Dec 23, 2020   AST 29 12/23/2020   ALKPHOS 72 23-Dec-2020     Microbiology: Recent Results (from the past 240 hour(s))  Culture, blood (Routine x 2)     Status: Abnormal   Collection Time: 23-Dec-2020  2:03 PM   Specimen: BLOOD  Result Value Ref Range Status   Specimen Description BLOOD LEFT ANTECUBITAL  Final   Special Requests   Final    BOTTLES DRAWN AEROBIC AND ANAEROBIC Blood Culture results may not be optimal due to an inadequate volume of blood received in culture bottles   Culture  Setup Time   Final    GRAM POSITIVE COCCI IN CLUSTERS AEROBIC BOTTLE ONLY CRITICAL VALUE NOTED.  VALUE IS CONSISTENT WITH PREVIOUSLY REPORTED AND CALLED VALUE.    Culture (A)  Final    STAPHYLOCOCCUS HOMINIS STAPHYLOCOCCUS  EPIDERMIDIS SUSCEPTIBILITIES PERFORMED ON PREVIOUS CULTURE WITHIN THE LAST 5 DAYS. THE SIGNIFICANCE OF ISOLATING THIS ORGANISM FROM A SINGLE SET OF BLOOD CULTURES WHEN MULTIPLE SETS ARE DRAWN IS UNCERTAIN. PLEASE NOTIFY THE MICROBIOLOGY DEPARTMENT WITHIN ONE WEEK IF SPECIATION AND SENSITIVITIES ARE REQUIRED. FOR STAPH HOMINIS Performed at [facility], [address] [place], [place] [address]    Report Status 12/13/2020 FINAL  Final  Culture, blood (Routine x 2)     Status: Abnormal   Collection Time: 12/23/2020  2:31 PM   Specimen: BLOOD  Result Value Ref Range Status   Specimen Description BLOOD LEFT ANTECUBITAL  Final   Special Requests   Final    BOTTLES DRAWN AEROBIC AND ANAEROBIC Blood Culture adequate volume   Culture  Setup Time   Final    GRAM POSITIVE COCCI IN CLUSTERS IN BOTH AEROBIC AND ANAEROBIC BOTTLES CRITICAL RESULT CALLED TO, READ BACK BY AND VERIFIED WITH: PHARMD J.FRENS AT 0818 ON 12/09/2020 BY T.SAAD. Performed at [facility], [address] [place], [place] [address]  Culture (A)  Final    STAPHYLOCOCCUS AUREUS STAPHYLOCOCCUS EPIDERMIDIS    Report Status 12/12/2020 FINAL  Final   Organism ID, Bacteria STAPHYLOCOCCUS AUREUS  Final   Organism ID, Bacteria STAPHYLOCOCCUS EPIDERMIDIS  Final      Susceptibility   Staphylococcus aureus - MIC*    CIPROFLOXACIN <=0.5 SENSITIVE Sensitive     ERYTHROMYCIN <=0.25 SENSITIVE Sensitive     GENTAMICIN <=0.5 SENSITIVE Sensitive     OXACILLIN 0.5 SENSITIVE Sensitive     TETRACYCLINE <=1 SENSITIVE Sensitive     VANCOMYCIN <=0.5 SENSITIVE Sensitive     TRIMETH/SULFA <=10 SENSITIVE Sensitive     CLINDAMYCIN <=0.25 SENSITIVE Sensitive     RIFAMPIN <=0.5 SENSITIVE Sensitive     Inducible Clindamycin NEGATIVE Sensitive     * STAPHYLOCOCCUS AUREUS   Staphylococcus epidermidis - MIC*    CIPROFLOXACIN <=0.5 SENSITIVE Sensitive     ERYTHROMYCIN <=0.25 SENSITIVE Sensitive     GENTAMICIN <=0.5 SENSITIVE Sensitive      OXACILLIN >=4 RESISTANT Resistant     TETRACYCLINE <=1 SENSITIVE Sensitive     VANCOMYCIN 1 SENSITIVE Sensitive     TRIMETH/SULFA <=10 SENSITIVE Sensitive     CLINDAMYCIN <=0.25 SENSITIVE Sensitive     RIFAMPIN <=0.5 SENSITIVE Sensitive     Inducible Clindamycin NEGATIVE Sensitive     * STAPHYLOCOCCUS EPIDERMIDIS  Blood Culture ID Panel (Reflexed)     Status: Abnormal   Collection Time: 11/27/2020  2:31 PM  Result Value Ref Range Status   Enterococcus faecalis NOT DETECTED NOT DETECTED Final   Enterococcus Faecium NOT DETECTED NOT DETECTED Final   Listeria monocytogenes NOT DETECTED NOT DETECTED Final   Staphylococcus species DETECTED (A) NOT DETECTED Final    Comment: CRITICAL RESULT CALLED TO, READ BACK BY AND VERIFIED WITH: PHARMD J.FRENS AT 0818 ON 12/09/2020 BY T.SAAD.    Staphylococcus aureus (BCID) DETECTED (A) NOT DETECTED Final    Comment: CRITICAL RESULT CALLED TO, READ BACK BY AND VERIFIED WITH: PHARMD J.FRENS AT 0818 ON 12/09/2020 BY T.SAAD.    Staphylococcus epidermidis DETECTED (A) NOT DETECTED Final    Comment: CRITICAL RESULT CALLED TO, READ BACK BY AND VERIFIED WITH: PHARMD J.FRENS AT 0818 ON 12/09/2020 BY T.SAAD.    Staphylococcus lugdunensis NOT DETECTED NOT DETECTED Final   Streptococcus species NOT DETECTED NOT DETECTED Final   Streptococcus agalactiae NOT DETECTED NOT DETECTED Final   Streptococcus pneumoniae NOT DETECTED NOT DETECTED Final   Streptococcus pyogenes NOT DETECTED NOT DETECTED Final   A.calcoaceticus-baumannii NOT DETECTED NOT DETECTED Final   Bacteroides fragilis NOT DETECTED NOT DETECTED Final   Enterobacterales NOT DETECTED NOT DETECTED Final   Enterobacter cloacae complex NOT DETECTED NOT DETECTED Final   Escherichia coli NOT DETECTED NOT DETECTED Final   Klebsiella aerogenes NOT DETECTED NOT DETECTED Final   Klebsiella oxytoca NOT DETECTED NOT DETECTED Final   Klebsiella pneumoniae NOT DETECTED NOT DETECTED Final   Proteus species NOT  DETECTED NOT DETECTED Final   Salmonella species NOT DETECTED NOT DETECTED Final   Serratia marcescens NOT DETECTED NOT DETECTED Final   Haemophilus influenzae NOT DETECTED NOT DETECTED Final   Neisseria meningitidis NOT DETECTED NOT DETECTED Final   Pseudomonas aeruginosa NOT DETECTED NOT DETECTED Final   Stenotrophomonas maltophilia NOT DETECTED NOT DETECTED Final   Candida albicans NOT DETECTED NOT DETECTED Final   Candida auris NOT DETECTED NOT DETECTED Final   Candida glabrata NOT DETECTED NOT DETECTED Final   Candida krusei NOT DETECTED NOT DETECTED Final   Candida parapsilosis NOT  DETECTED NOT DETECTED Final   Candida tropicalis NOT DETECTED NOT DETECTED Final   Cryptococcus neoformans/gattii NOT DETECTED NOT DETECTED Final   Methicillin resistance mecA/C DETECTED (A) NOT DETECTED Final    Comment: CRITICAL RESULT CALLED TO, READ BACK BY AND VERIFIED WITH: PHARMD J.FRENS AT 0818 ON 12/09/2020 BY T.SAAD.    Meth resistant mecA/C and MREJ NOT DETECTED NOT DETECTED Final    Comment: Performed at [facility], [address], [place], [place] [address]  Resp Panel by RT-PCR (Flu A&B, Covid) Nasopharyngeal Swab     Status: None   Collection Time: 01/06/2021  5:38 PM   Specimen: Nasopharyngeal Swab; Nasopharyngeal(NP) swabs in vial transport medium  Result Value Ref Range Status   SARS Coronavirus 2 by RT PCR NEGATIVE NEGATIVE Final    Comment: (NOTE) SARS-CoV-2 target nucleic acids are NOT DETECTED.  The SARS-CoV-2 RNA is generally detectable in upper respiratory specimens during the acute phase of infection. The lowest concentration of SARS-CoV-2 viral copies this assay can detect is 138 copies/mL. A negative result does not preclude SARS-Cov-2 infection and should not be used as the sole basis for treatment or other patient management decisions. A negative result may occur with  improper specimen collection/handling, submission of specimen other than nasopharyngeal swab,  presence of viral mutation(s) within the areas targeted by this assay, and inadequate number of viral copies(<138 copies/mL). A negative result must be combined with clinical observations, patient history, and epidemiological information. The expected result is Negative.  Fact Sheet for Patients:  BloggerCourse.com  Fact Sheet for Healthcare Providers:  SeriousBroker.it  This test is no t yet approved or cleared by the [place] FDA and  has been authorized for detection and/or diagnosis of SARS-CoV-2 by FDA under an Emergency Use Authorization (EUA). This EUA will remain  in effect (meaning this test can be used) for the duration of the COVID-19 declaration under Section 564(b)(1) of the Act, 21 U.S.C.section 360bbb-3(b)(1), unless the authorization is terminated  or revoked sooner.       Influenza A by PCR NEGATIVE NEGATIVE Final   Influenza B by PCR NEGATIVE NEGATIVE Final    Comment: (NOTE) The Xpert Xpress SARS-CoV-2/FLU/RSV plus assay is intended as an aid in the diagnosis of influenza from Nasopharyngeal swab specimens and should not be used as a sole basis for treatment. Nasal washings and aspirates are unacceptable for Xpert Xpress SARS-CoV-2/FLU/RSV testing.  Fact Sheet for Patients: BloggerCourse.com  Fact Sheet for Healthcare Providers: SeriousBroker.it  This test is not yet approved or cleared by the [place] FDA and has been authorized for detection and/or diagnosis of SARS-CoV-2 by FDA under an Emergency Use Authorization (EUA). This EUA will remain in effect (meaning this test can be used) for the duration of the COVID-19 declaration under Section 564(b)(1) of the Act, 21 U.S.C. section 360bbb-3(b)(1), unless the authorization is terminated or revoked.  Performed at [facility], [address], [place], [place] [address]   MRSA Next Gen by  PCR, Nasal     Status: None   Collection Time: 2021-01-06  7:45 PM   Specimen: Nasal Mucosa; Nasal Swab  Result Value Ref Range Status   MRSA by PCR Next Gen NOT DETECTED NOT DETECTED Final    Comment: (NOTE) The GeneXpert MRSA Assay (FDA approved for NASAL specimens only), is one component of a comprehensive MRSA colonization surveillance program. It is not intended to diagnose MRSA infection nor to guide or monitor treatment for MRSA infections. Test  performance is not FDA approved in patients less than 469 years old. Performed at [facility], [address] [place], [place] [address]   Culture, blood (routine x 2)     Status: None (Preliminary result)   Collection Time: 12/10/20  7:49 AM   Specimen: BLOOD RIGHT HAND  Result Value Ref Range Status   Specimen Description BLOOD RIGHT HAND  Final   Special Requests   Final    BOTTLES DRAWN AEROBIC ONLY Blood Culture adequate volume   Culture   Final    NO GROWTH 3 DAYS Performed at [facility], [address] [place], [place] [address]    Report Status PENDING  Incomplete  Culture, blood (routine x 2)     Status: None (Preliminary result)   Collection Time: 12/10/20  7:54 AM   Specimen: BLOOD RIGHT HAND  Result Value Ref Range Status   Specimen Description BLOOD RIGHT HAND  Final   Special Requests   Final    BOTTLES DRAWN AEROBIC ONLY Blood Culture adequate volume   Culture   Final    NO GROWTH 3 DAYS Performed at [facility], [address] [place], [place] [address]    Report Status PENDING  Incomplete  MRSA Next Gen by PCR, Nasal     Status: None   Collection Time: 12/11/20  9:26 AM   Specimen: Nasal Mucosa; Nasal Swab  Result Value Ref Range Status   MRSA by PCR Next Gen NOT DETECTED NOT DETECTED Final    Comment: (NOTE) The GeneXpert MRSA Assay (FDA approved for NASAL specimens only), is one component of a comprehensive MRSA colonization surveillance program. It is not intended to diagnose MRSA  infection nor to guide or monitor treatment for MRSA infections. Test performance is not FDA approved in patients less than 68 years old. Performed at [facility], [address] [place], [place] [address]     Impression/Plan:  1. Bacteremia - multiple species including Staph aureus.  Repeat blood cultures remain negative.  TTE unrevealing. I have spoken to the Cards master and they will try to arrange TEE while intubated.    2.  Medication monitoring - total CK now down and no new concerns while on daptomycin.  Will continue to monitor weekly now.    3.  Fever - continues to have a fever likely from #1.

## 2020-12-13 NOTE — Progress Notes (Signed)
Pharmacy Electrolyte Replacement  Recent Labs:  Recent Labs    12/13/20 0440  K 3.2*  MG 1.7  PHOS 1.8*  CREATININE 0.85    Low Critical Values (K </= 2.5, Phos </= 1, Mg </= 1) Present: None  MD Contacted: n/a due to no critical values  *Already receiving KPhos for low K and Phos.   Plan: Magnesium 2g IV x1 per protocol.  Recheck Mag in AM  Link Snuffer, PharmD, BCPS, BCCCP Clinical Pharmacist Please refer to [facility] for [facility] numbers 12/13/2020, 8:55 AM

## 2020-12-13 NOTE — Progress Notes (Signed)
  Echocardiogram Echocardiogram Transesophageal has been performed.  Delcie Roch 12/13/2020, 3:23 PM

## 2020-12-13 NOTE — Progress Notes (Signed)
Nutrition Follow-up  DOCUMENTATION CODES:   Not applicable  INTERVENTION:   Tube Feeding via Cortrak:  Vital 1.5 at 60 ml/hr Pro-Source TF 45 mL TID Provides 2280 kcals, 130 g of protein and 1094 mL of free water  If continues without BM, recommend increasing bowel regimen   NUTRITION DIAGNOSIS:   Moderate Malnutrition related to chronic illness as evidenced by mild fat depletion, mild muscle depletion, moderate fat depletion, edema.  Being addressed via TF   GOAL:   Patient will meet greater than or equal to 90% of their needs  Progressing  MONITOR:   Vent status, Labs, Weight trends, TF tolerance  REASON FOR ASSESSMENT:   Consult Enteral/tube feeding initiation and management  ASSESSMENT:   70 yo male admitted with DKA. PMH includes HTN, CAD, gastroparesis, GERD, HLD, depression, DM-2, PVD, L BKA, OSA.  6/23 Admitted 6/24 Cortrak placed, Intubated  Pt remains on vent support  Omoslite 1.2 at 40 ml/hr via Cortrak; TF off today for TEE  Weight trending up, non pitting edema on exam  Labs: phosphorus 1.8 (L) Meds: ss novolog, 8 units novolog q 4 hours, 45 units lantus BID, potassium phosphate, colace, miralax   NUTRITION - FOCUSED PHYSICAL EXAM:  Flowsheet Row Most Recent Value  Orbital Region Mild depletion  Upper Arm Region No depletion  Thoracic and Lumbar Region No depletion  Buccal Region Mild depletion  Temple Region Mild depletion  Clavicle Bone Region Moderate depletion  Clavicle and Acromion Bone Region Moderate depletion  Scapular Bone Region Moderate depletion  Dorsal Hand No depletion  Patellar Region Unable to assess  Anterior Thigh Region Unable to assess  Posterior Calf Region Unable to assess  Edema (RD Assessment) Mild  Hair Reviewed  Eyes Reviewed  Mouth Reviewed  Skin Reviewed  Nails Reviewed       Diet Order:   Diet Order             Diet NPO time specified  Diet effective now                   EDUCATION  NEEDS:   No education needs have been identified at this time  Skin:  Skin Assessment: Skin Integrity Issues: Skin Integrity Issues:: Stage I Stage I: buttocks Other: abrasions and skin tears to RLE  Last BM:  no BM, +flatus  Height:   Ht Readings from Last 1 Encounters:  11/30/2020 6\' 3"  (1.905 m)    Weight:   Wt Readings from Last 1 Encounters:  12/13/20 110.6 kg     BMI:  Body mass index is 30.48 kg/m.  Estimated Nutritional Needs:   Kcal:  2200-2400  Protein:  110-130 gm  Fluid:  2.2-2.4 L   12/15/20 MS, RDN, LDN, CNSC Registered Dietitian III Clinical Nutrition RD Pager and On-Call Pager Number Located in [place]

## 2020-12-13 NOTE — Progress Notes (Signed)
NAME:  Samuel [name], MRN:  [id number], DOB:  Jun 06, 1951, LOS: 5 ADMISSION DATE:  11/27/2020, CONSULTATION DATE:  11/20/2020 CHIEF COMPLAINT:  DKA   History of Present Illness:  Patient presents with N/V, obtunded. Unable to provide history or get ahold of family. History obtained from chart review and per report.  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 He had 3 days of increased vomiting, per family.  Pertinent  Medical History  IDT2DM, CAD, HTN, h/o MRSA sepsis, h/o osteomyelitis, h/o PE, s/p left BKA  Significant Hospital Events: Including procedures, antibiotic start and stop dates in addition to other pertinent events   - 6/23 admitted - 6/24 worsening secretion burden, intubated   Interim History / Subjective:  6/28: still with some bloody ett secretions. For tee today per ID. Remains awake and follws commands. He still has copious secrestions that are thick and challenging for him to manage.  6/27: overnight with ett bleeding and oral bleeding, heparin infusion stopped accordingly. On wean 8/5. Will cont with trial. Need to clarify for need of tee with cultures that were subsequently negative after 1 set positive and no valvular abnormalities on tte.   Objective   Blood pressure 140/60, pulse (!) 103, temperature 100.22 F (37.9 C), resp. rate (!) 34, height 6\' 3"  (1.905 m), weight 110.6 kg, SpO2 96 %.    Vent Mode: PRVC FiO2 (%):  [40 %] 40 % Set Rate:  [16 bmp] 16 bmp Vt Set:  [600 mL] 600 mL PEEP:  [5 cmH20] 5 cmH20 Pressure Support:  [8 cmH20] 8 cmH20 Plateau Pressure:  [19 cmH20] 19 cmH20   Intake/Output Summary (Last 24 hours) at 12/13/2020 0914 Last data filed at 12/13/2020 0600 Gross per 24 hour  Intake 3332.04 ml  Output 1494 ml  Net 1838.04 ml   Filed Weights   12/11/20 0500 12/12/20 0500 12/13/20 0500  Weight: 105 kg 109.2 kg 110.6 kg    Examination: Constitutional: drowsy on vent, nodding seemingly appropriately Eyes: pupils small, reactive, sclera  injected Ears, nose, mouth, and throat: ETT in place, poor dentition, small bloody secretions Cardiovascular: RRR, ext warm, faint SEM Respiratory: clear, triggers vent Gastrointestinal: soft, +BS Skin: RLE wrapped, amputated digits Neurologic: nods and shakes head seemingly appropriately. Grossly weak and diffusely edematous.  Psychiatric: uta     Labs/imaging that I have personally reviewed  (right click and "Reselect all SmartList Selections" daily)  CK improved Renal function stable CBGs up slightly on current regimen  Resolved Hospital Problem list   DKA in patient with poorly controlled Type 2 DM resolved  Assessment & Plan:    Severe sepsis secondary to RLE cellulitis with bacteremia: MRSE and MSSA; echo w/o obvious endocarditis -blood cultures cleared -remains on abx.  -ID following -monitor CPK -will need to clarify need for TEE prior to extubation.  DM with hyperglycemia- A1c 6/23 7.0, on 100 units insulin at home -will resume tf dosing but at reduced dose -changing insulin regmien esp in light of fluctuations and need for npo with ett Obesity Hx CAD Hx OSA noncompliant CPAP Septic and hypovolemic shock resolved Afib converted with amio -remains in sinus -will resume heparin after tee if able.  Metabolic encephalopathy improving -able to nod but very weak.  Acute hypoxemic respiratory failure due to inability to handle secretions from his encephalopathy -did not tolerated wean more than 7/23 this am 2.2 increased rr,     Best practice:  Diet: TF Pain/Anxiety/Delirium protocol (if indicated): fent VAP protocol (if  indicated): in place DVT prophylaxis: heparin gtt: on hold GI prophylaxis: PPI Glucose control: endotool Mobility: BR Code Status: full Family Communication: called 6/25 and updated Disposition: ICU pending vent liberation  Critical care time: The patient is critically ill with multiple organ systems failure and requires high complexity  decision making for assessment and support, frequent evaluation and titration of therapies, application of advanced monitoring technologies and extensive interpretation of multiple databases.  Critical care time 34 mins. This represents my time independent of the NPs time taking care of the pt. This is excluding procedures.    Briant Sites DO [facility] 12/13/2020, 9:14 AM See Amion for pager If no response to pager, please call [phone] until 1900 After 1900 please call [facility] [phone]

## 2020-12-13 NOTE — Progress Notes (Signed)
Samuel [name] for heparin Indication: atrial fibrillation  Allergies  Allergen Reactions   Gabapentin Hives, Itching, Nausea And Vomiting and Rash    REACTION: rash/hives (per patient, was a reaction to an inactive ingredient in another mgf brand). The patient stated that he does take this medication now and it doesn't cause a rash any more. LOW DOSE   Relafen [Nabumetone] Nausea And Vomiting and Rash   Codeine Nausea And Vomiting and Rash    Patient Measurements: Height: 6' 3"  (190.5 cm) Weight: 110.6 kg (243 lb 13.3 oz) IBW/kg (Calculated) : 84.5 Heparin Dosing Weight: 105kg  Vital Signs: Temp: 100 F (37.8 C) (06/28 2000) Temp Source: Bladder (06/28 2000) BP: 115/52 (06/28 2000) Pulse Rate: 79 (06/28 2000)  Labs: Recent Labs    12/11/20 0430 12/11/20 0440 12/12/20 0434 12/13/20 0440 12/13/20 0541 12/13/20 2126  HGB 11.5*  --  10.8*  --  10.4*  --   HCT 33.3*  --  31.5*  --  30.4*  --   PLT 125*  --  151  --  139*  --   HEPARINUNFRC  --  0.40 <0.10*  --   --  0.10*  CREATININE 1.20  --  0.96 0.85  --   --   CKTOTAL 755*  --  966* 319  --   --      Estimated Creatinine Clearance: 113.2 mL/min (by C-G formula based on SCr of 0.85 mg/dL).   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 of 2012   Shortness of breath    exertion   Type II diabetes mellitus (HCC)      Medications:  Medications Prior to Admission  Medication Sig Dispense Refill Last Dose   acarbose (PRECOSE) 50 MG tablet Take 50 mg by mouth 3 (three) times daily with meals.   Past Week   alum & mag hydroxide-simeth (MAALOX/MYLANTA) 200-200-20 MG/5ML suspension Take 15 mLs by mouth every 6 (six) hours as needed for indigestion or heartburn. 355 mL 0 unknown   amLODipine (NORVASC) 10 MG tablet Take 1 tablet (10 mg total) by mouth daily. 30 tablet 2 Past Week   apixaban (ELIQUIS) 5 MG TABS tablet Take 5 mg by mouth 2 (two) times daily.   Past Week   aspirin 81 MG EC tablet Take 1 tablet (81 mg total) by mouth daily. 30 tablet 0 Past Week   atorvastatin (LIPITOR) 80 MG tablet Take 1 tablet (80 mg total) by mouth daily at 6 PM. (Patient taking differently: Take 80 mg by mouth daily.) 30 tablet 0 Past Week   clopidogrel (PLAVIX) 75 MG tablet Take 75 mg by mouth daily.   Past Week   diclofenac Sodium (VOLTAREN) 1 % GEL Rub a small grape sized dollop into  the sore joints of hands up to 3 times daily as needed for pain. 150 g 1 unknown   docusate sodium (COLACE) 100 MG capsule Take 100 mg by mouth daily.   Past Week   furosemide (LASIX) 20 MG tablet Take 1 tablet (20 mg total) by mouth daily. 30 tablet 0 Past Week   gabapentin (NEURONTIN) 300 MG capsule TAKE 1 CAPSULE BY MOUTH 3 TIMES A DAY (Patient taking differently: Take 300 mg by mouth 3 (three) times daily.) 90 capsule 6 Past Week   insulin aspart (NOVOLOG FLEXPEN) 100 UNIT/ML FlexPen Inject 20 Units into the skin 3 (three) times daily with meals. 15 mL 11 Past Week   insulin degludec (TRESIBA FLEXTOUCH) 200 UNIT/ML FlexTouch Pen Inject 100 Units into the skin daily. 15 mL 11 Past Week   metoprolol tartrate (LOPRESSOR) 25 MG tablet Take 1 tablet (25 mg total) by mouth 2 (two) times daily. 60 tablet 0 Past Week   nitroGLYCERIN (NITROSTAT) 0.4 MG SL tablet Place 1 tablet (0.4 mg total) under the tongue every 5 (five) minutes as needed for chest  pain. 30 tablet 0 unknown   ONETOUCH VERIO test strip Use to test blood sugar twice daily  4    pantoprazole (PROTONIX) 40 MG tablet Take 1 tablet (40 mg total) by mouth 2 (two) times daily before a meal. 60 tablet 0 Past Week   promethazine (PHENERGAN) 25 MG tablet TAKE 1 TO 2 TABLETS BY MOUTH EVERY 4 TO 6 HOURS AS NEEDED FOR NAUSEA (Patient taking differently: Take 25-50 mg by mouth every 6 (six) hours as needed for nausea.) 240 tablet 1 unknown   traMADol (ULTRAM) 50 MG tablet Take 2 tablets (100 mg total) by mouth every 6 (six) hours as needed for moderate pain. 30 tablet 1 unknown   promethazine (PHENERGAN) 25 MG/ML injection Inject 0.5 mLs (12.5 mg total) into the muscle every 6 (six) hours as needed for nausea or vomiting. (Patient not taking: No sig reported) 25 mL 11 Not Taking   ranolazine (RANEXA) 500 MG 12 hr tablet Take 1 tablet (500 mg total) by mouth 2 (two) times daily. (Patient not taking: No sig reported) 60 tablet 0 Not Taking   Scheduled:   amiodarone  200 mg Per Tube BID   chlorhexidine gluconate (MEDLINE KIT)  15 mL Mouth Rinse BID   Chlorhexidine Gluconate Cloth  6 each Topical Daily   clotrimazole   Topical BID   docusate  100 mg Per Tube BID   feeding supplement (PROSource TF)  45 mL Per Tube BID   fentaNYL (SUBLIMAZE) injection  25 mcg Intravenous Once   insulin aspart  0-20 Units Subcutaneous Q4H   insulin aspart  8 Units Subcutaneous Q4H   insulin glargine  35 Units Subcutaneous BID   mouth rinse  15 mL Mouth Rinse 10 times per day   [START ON 12/14/2020] pantoprazole sodium  40 mg Per Tube Daily   polyethylene glycol  17 g Per Tube Daily   sodium chloride flush  10-40 mL Intracatheter Q12H   Infusions:   DAPTOmycin (CUBICIN)  IV 850 mg (12/13/20 2004)   feeding supplement (VITAL 1.5 CAL) Stopped (12/13/20 1735)   fentaNYL infusion INTRAVENOUS 175 mcg/hr (12/13/20 1800)   heparin 1,500 Units/hr (12/13/20 1800)   lactated ringers 75 mL/hr at 12/13/20 1800    potassium PHOSPHATE IVPB (in mmol) Stopped (12/12/20 1601)    Assessment: Pt was admitted for DKA. He has been on apixaban PTA for a hx PE?  Last dose in the past week? Heparin has been ordered here for new afibrillation. Baseline PTT 28, HL undetectable. Patient was on Heparin until 6/26 when started having bloody secretions per ETT. Still blood tinged but improved. Discussed with Dr. Ruthann Cancer today - ok to resume IV Heparin today after TEE performed.   Hgb 10.4 and Plts 130 - low but stable. Some pink tinged secretions noted but improved. AKI resolving - SCr has improved at 0.85 and good urine output reported. Patient was previously therapeutic on a rate of 1500 units/hr (~14 units/kg/hr using HDW).  Heparin level 0.10 on heparin 1500 units/hr is subtherapeutic but drawn 4 hrs after infusion start and no bolus administered. No issues with IV infusion or access per RN. Continued moderate amount blood tinged in oral secretions and in ET tube - not frank red blood. No other signs of bleeding.   Goal of Therapy:  Heparin level 0.3-0.7 units/ml Monitor platelets by anticoagulation protocol: Yes   Plan:  Continue heparin 1500 units/hr since level was drawn early  Check 6 hr level from when last level obtained  Follow up bleeding   Cristela Felt, PharmD Clinical Pharmacist  12/13/2020, 10:40 PM

## 2020-12-13 NOTE — Progress Notes (Deleted)
Echocardiogram 2D Echocardiogram has been performed.  Delcie Roch 12/13/2020, 3:22 PM

## 2020-12-13 NOTE — Procedures (Signed)
INDICATIONS: bacteremia  PROCEDURE:   Informed consent was obtained prior to the procedure. The risks, benefits and alternatives for the procedure were discussed and the patient's family comprehended these risks.  Risks include, but are not limited to, cough, sore throat, vomiting, nausea, somnolence, esophageal and stomach trauma or perforation, bleeding, low blood pressure, aspiration, pneumonia, infection, trauma to the teeth and death.    During this procedure the patient was administered a total of Versed 2 mg and Fentanyl IV infusion to achieve and maintain moderate conscious sedation.  The patient's heart rate, blood pressure, and oxygen saturationweare monitored continuously during the procedure. The period of conscious sedation was 20 minutes, of which I was present face-to-face 100% of this time.  The transesophageal probe was inserted in the esophagus and stomach without difficulty and multiple views were obtained.  The patient was kept under observation until the patient left the procedure room.  The patient left the procedure room in stable condition.   Agitated microbubble saline contrast was not administered.  COMPLICATIONS:    There were no immediate complications.  FINDINGS:  No vegetations seen  RECOMMENDATIONS:     Evaluate for other sources of bacteremia  Time Spent Directly with the Patient:  30 minutes   [place] Weekly 12/13/2020, 3:29 PM

## 2020-12-14 ENCOUNTER — Inpatient Hospital Stay (HOSPITAL_COMMUNITY): Payer: HMO

## 2020-12-14 DIAGNOSIS — I4891 Unspecified atrial fibrillation: Secondary | ICD-10-CM

## 2020-12-14 DIAGNOSIS — B9561 Methicillin susceptible Staphylococcus aureus infection as the cause of diseases classified elsewhere: Secondary | ICD-10-CM

## 2020-12-14 DIAGNOSIS — R7881 Bacteremia: Secondary | ICD-10-CM

## 2020-12-14 LAB — CBC
HCT: 28.2 % — ABNORMAL LOW (ref 39.0–52.0)
Hemoglobin: 9.3 g/dL — ABNORMAL LOW (ref 13.0–17.0)
MCH: 30.4 pg (ref 26.0–34.0)
MCHC: 33 g/dL (ref 30.0–36.0)
MCV: 92.2 fL (ref 80.0–100.0)
Platelets: 169 10*3/uL (ref 150–400)
RBC: 3.06 MIL/uL — ABNORMAL LOW (ref 4.22–5.81)
RDW: 16.1 % — ABNORMAL HIGH (ref 11.5–15.5)
WBC: 8.8 10*3/uL (ref 4.0–10.5)
nRBC: 0.2 % (ref 0.0–0.2)

## 2020-12-14 LAB — BASIC METABOLIC PANEL
Anion gap: 4 — ABNORMAL LOW (ref 5–15)
BUN: 19 mg/dL (ref 8–23)
CO2: 25 mmol/L (ref 22–32)
Calcium: 7.8 mg/dL — ABNORMAL LOW (ref 8.9–10.3)
Chloride: 116 mmol/L — ABNORMAL HIGH (ref 98–111)
Creatinine, Ser: 1.02 mg/dL (ref 0.61–1.24)
GFR, Estimated: >60 mL/min (ref >60)
Glucose, Bld: 85 mg/dL (ref 70–99)
Potassium: 3.7 mmol/L (ref 3.5–5.1)
Sodium: 145 mmol/L (ref 135–145)

## 2020-12-14 LAB — HEPARIN LEVEL (UNFRACTIONATED)
Heparin Unfractionated: <0.1 IU/mL — ABNORMAL LOW (ref 0.30–0.70)
Heparin Unfractionated: 0.25 IU/mL — ABNORMAL LOW (ref 0.30–0.70)
Heparin Unfractionated: 0.32 IU/mL (ref 0.30–0.70)

## 2020-12-14 LAB — GLUCOSE, CAPILLARY
Glucose-Capillary: 86 mg/dL (ref 70–99)
Glucose-Capillary: 78 mg/dL (ref 70–99)
Glucose-Capillary: 88 mg/dL (ref 70–99)
Glucose-Capillary: 137 mg/dL — ABNORMAL HIGH (ref 70–99)
Glucose-Capillary: 143 mg/dL — ABNORMAL HIGH (ref 70–99)
Glucose-Capillary: 137 mg/dL — ABNORMAL HIGH (ref 70–99)

## 2020-12-14 LAB — MAGNESIUM: Magnesium: 2.3 mg/dL (ref 1.7–2.4)

## 2020-12-14 LAB — PHOSPHORUS: Phosphorus: 2.5 mg/dL (ref 2.5–4.6)

## 2020-12-14 MED ORDER — SODIUM CHLORIDE 0.9 % IV SOLN
INTRAVENOUS | Status: DC | PRN
  Administered 2020-12-14 – 2020-12-17 (×2): 500 mL via INTRAVENOUS

## 2020-12-14 MED ORDER — ALBUTEROL SULFATE (2.5 MG/3ML) 0.083% IN NEBU
2.5000 mg | INHALATION_SOLUTION | Freq: Two times a day (BID) | RESPIRATORY_TRACT | Status: AC
  Administered 2020-12-14 – 2020-12-15 (×4): 2.5 mg via RESPIRATORY_TRACT
  Filled 2020-12-14 (×4): qty 3

## 2020-12-14 MED ORDER — FREE WATER
100.0000 mL | Freq: Four times a day (QID) | Status: DC
  Administered 2020-12-14 – 2020-12-17 (×13): 100 mL

## 2020-12-14 MED ORDER — ACETYLCYSTEINE 20 % IN SOLN
4.0000 mL | Freq: Two times a day (BID) | RESPIRATORY_TRACT | Status: AC
  Administered 2020-12-14 – 2020-12-15 (×4): 4 mL via RESPIRATORY_TRACT
  Filled 2020-12-14 (×4): qty 4

## 2020-12-14 NOTE — Procedures (Signed)
Cortrak  Person Inserting Tube:  Osa Craver, RD Tube Type:  Cortrak - 43 inches Tube Size:  10 Tube Location:  Right nare Initial Placement:  Stomach Secured by: Bridle Technique Used to Measure Tube Placement:  Marking at nare/corner of mouth Cortrak Secured At:  69 cm  Cortrak Tube Team Note:  Consult received to place a Cortrak feeding tube.   X-ray is required, abdominal x-ray has been ordered by the Cortrak team. Please confirm tube placement before using the Cortrak tube.   If the tube becomes dislodged please keep the tube and contact the Cortrak team at [email or web address] (password [email or web address]) for replacement.  If after hours and replacement cannot be delayed, place a NG tube and confirm placement with an abdominal x-ray.   Romelle Starcher MS, RDN, LDN, CNSC Registered Dietitian III Clinical Nutrition RD Pager and On-Call Pager Number Located in [place]

## 2020-12-14 NOTE — Progress Notes (Signed)
Pt. Refused cpt this time. RN aware.

## 2020-12-14 NOTE — Progress Notes (Signed)
ANTICOAGULATION CONSULT NOTE  Pharmacy Consult for heparin Indication: atrial fibrillation  Allergies  Allergen Reactions   Gabapentin Hives, Itching, Nausea And Vomiting and Rash    REACTION: rash/hives (per patient, was a reaction to an inactive ingredient in another mgf brand). The patient stated that he does take this medication now and it doesn't cause a rash any more. LOW DOSE   Relafen [Nabumetone] Nausea And Vomiting and Rash   Codeine Nausea And Vomiting and Rash    Patient Measurements: Height: 6\' 3"  (190.5 cm) Weight: 111.7 kg (246 lb 4.1 oz) IBW/kg (Calculated) : 84.5 Heparin Dosing Weight: 105kg  Vital Signs: Temp: 98.96 F (37.2 C) (06/29 1300) Temp Source: Bladder (06/29 1200) BP: 122/56 (06/29 1300) Pulse Rate: 87 (06/29 1300)  Labs: Recent Labs    12/12/20 0434 12/13/20 0440 12/13/20 0541 12/13/20 2126 12/14/20 0353 12/14/20 1208  HGB 10.8*  --  10.4*  --  9.3*  --   HCT 31.5*  --  30.4*  --  28.2*  --   PLT 151  --  139*  --  169  --   HEPARINUNFRC <0.10*  --   --  0.10* <0.10* 0.25*  CREATININE 0.96 0.85  --   --  1.02  --   CKTOTAL 966* 319  --   --   --   --     Estimated Creatinine Clearance: 94.8 mL/min (by C-G formula based on SCr of 1.02 mg/dL).   Assessment: Pt was admitted for DKA. He has been on apixaban PTA for a hx PE? Last dose in the past week? Heparin has been ordered here for new afib. Baseline PTT 28, HL undetectable. Patient was on Heparin until 6/26 when started having bloody secretions per ETT. Improved to pink-tinged and heparin resumed.   Heparin level 0.25 on gtt at 1800 units/hr - subtherapeutic but increasing.  No issues with line reported per RN. Secretions have improved. No overt bleeding noted.   Goal of Therapy:  Heparin level 0.3-0.7 units/ml Monitor platelets by anticoagulation protocol: Yes   Plan:  Increase heparin to 2000 units/hr  F/u 6 hr heparin level  7/26, PharmD, BCPS, BCCCP Clinical  Pharmacist Please refer to [facility] for [facility] numbers 12/14/2020, 1:18 PM

## 2020-12-14 NOTE — Progress Notes (Signed)
ANTICOAGULATION CONSULT NOTE  Pharmacy Consult for IV Heparin Indication: atrial fibrillation  Allergies  Allergen Reactions   Gabapentin Hives, Itching, Nausea And Vomiting and Rash    REACTION: rash/hives (per patient, was a reaction to an inactive ingredient in another mgf brand). The patient stated that he does take this medication now and it doesn't cause a rash any more. LOW DOSE   Relafen [Nabumetone] Nausea And Vomiting and Rash   Codeine Nausea And Vomiting and Rash    Patient Measurements: Height: 6\' 3"  (190.5 cm) Weight: 111.7 kg (246 lb 4.1 oz) IBW/kg (Calculated) : 84.5 Heparin Dosing Weight: 105 kg  Vital Signs: Temp: 98.42 F (36.9 C) (06/29 2000) Temp Source: Bladder (06/29 1600) BP: 115/72 (06/29 2000) Pulse Rate: 78 (06/29 2000)  Labs: Recent Labs    12/12/20 0434 12/13/20 0440 12/13/20 0541 12/13/20 2126 12/14/20 0353 12/14/20 1208 12/14/20 2038  HGB 10.8*  --  10.4*  --  9.3*  --   --   HCT 31.5*  --  30.4*  --  28.2*  --   --   PLT 151  --  139*  --  169  --   --   HEPARINUNFRC <0.10*  --   --    < > <0.10* 0.25* 0.32  CREATININE 0.96 0.85  --   --  1.02  --   --   CKTOTAL 966* 319  --   --   --   --   --    < > = values in this interval not displayed.     Estimated Creatinine Clearance: 94.8 mL/min (by C-G formula based on SCr of 1.02 mg/dL).   Assessment: 70 yr old man admitted for DKA. He was on apixaban PTA for a hx PE? Last dose in the past week? Heparin has been ordered here for new afib. Baseline aPTT 28, heparin level undetectable. Patient was on IV Heparin until 6/26 when started having bloody secretions per ETT; improved to pink-tinged and heparin was resumed.   Heparin level ~7 hrs after heparin infusion was increased to 2000 units/hr was 0.32 units/ml, which is at the low end of the goal range for this pt. H/H 9.3/28.2, plt 169. Per RN, no issues with IV or bleeding observed, no more pink-tinged secretions.  Goal of Therapy:   Heparin level 0.3-0.7 units/ml Monitor platelets by anticoagulation protocol: Yes   Plan:  Increase heparin infusion to 2050 units/hr  Check heparin level in 6 hrs Monitor daily heparin level, CBC Monitor for bleeding  10-29-1996, PharmD, BCPS, [facility] Clinical Pharmacist 12/14/2020, 9:47 PM

## 2020-12-14 NOTE — Progress Notes (Signed)
[facility]   Reason for visit: Follow up on bacteremia  Interval History: TEE negative for any vegetation.  WBC 8.8 and Tmax 100.7.   Day 6 daptomycin Day 7 total antibiotics   Physical Exam: Constitutional:  Vitals:   12/14/20 0700 12/14/20 0806  BP: (!) 119/51 (!) 137/57  Pulse: 74 68  Resp: 16 16  Temp: 99.32 F (37.4 C)   SpO2: 95% 99%   patient appears in NAD Eyes: anicteric HENT: +ET Respiratory: respiratory effort on vent; CTA B Cardiovascular: RRR Neuro: alert  Review of Systems: Unable to be assessed due to patient factors  Lab Results  Component Value Date   WBC 8.8 12/14/2020   HGB 9.3 (L) 12/14/2020   HCT 28.2 (L) 12/14/2020   MCV 92.2 12/14/2020   PLT 169 12/14/2020    Lab Results  Component Value Date   CREATININE 1.02 12/14/2020   BUN 19 12/14/2020   NA 145 12/14/2020   K 3.7 12/14/2020   CL 116 (H) 12/14/2020   CO2 25 12/14/2020    Lab Results  Component Value Date   ALT 18 11/29/2020   AST 29 12/12/2020   ALKPHOS 72 12/14/2020     Microbiology: Recent Results (from the past 240 hour(s))  Culture, blood (Routine x 2)     Status: Abnormal   Collection Time: 11/30/2020  2:03 PM   Specimen: BLOOD  Result Value Ref Range Status   Specimen Description BLOOD LEFT ANTECUBITAL  Final   Special Requests   Final    BOTTLES DRAWN AEROBIC AND ANAEROBIC Blood Culture results may not be optimal due to an inadequate volume of blood received in culture bottles   Culture  Setup Time   Final    GRAM POSITIVE COCCI IN CLUSTERS AEROBIC BOTTLE ONLY CRITICAL VALUE NOTED.  VALUE IS CONSISTENT WITH PREVIOUSLY REPORTED AND CALLED VALUE.    Culture (A)  Final    STAPHYLOCOCCUS HOMINIS STAPHYLOCOCCUS EPIDERMIDIS SUSCEPTIBILITIES PERFORMED ON PREVIOUS CULTURE WITHIN THE LAST 5 DAYS. THE SIGNIFICANCE OF ISOLATING THIS ORGANISM FROM A SINGLE SET OF BLOOD CULTURES WHEN MULTIPLE SETS ARE DRAWN IS UNCERTAIN. PLEASE NOTIFY THE  MICROBIOLOGY DEPARTMENT WITHIN ONE WEEK IF SPECIATION AND SENSITIVITIES ARE REQUIRED. FOR STAPH HOMINIS Performed at [facility], [address] [place], [place] [address]    Report Status 12/13/2020 FINAL  Final  Culture, blood (Routine x 2)     Status: Abnormal   Collection Time: 11/19/2020  2:31 PM   Specimen: BLOOD  Result Value Ref Range Status   Specimen Description BLOOD LEFT ANTECUBITAL  Final   Special Requests   Final    BOTTLES DRAWN AEROBIC AND ANAEROBIC Blood Culture adequate volume   Culture  Setup Time   Final    GRAM POSITIVE COCCI IN CLUSTERS IN BOTH AEROBIC AND ANAEROBIC BOTTLES CRITICAL RESULT CALLED TO, READ BACK BY AND VERIFIED WITH: PHARMD J.FRENS AT 0818 ON 12/09/2020 BY T.SAAD. Performed at [facility], [address], [place], [place] [address]    Culture (A)  Final    STAPHYLOCOCCUS AUREUS STAPHYLOCOCCUS EPIDERMIDIS    Report Status 12/12/2020 FINAL  Final   Organism ID, Bacteria STAPHYLOCOCCUS AUREUS  Final   Organism ID, Bacteria STAPHYLOCOCCUS EPIDERMIDIS  Final      Susceptibility   Staphylococcus aureus - MIC*    CIPROFLOXACIN <=0.5 SENSITIVE Sensitive     ERYTHROMYCIN <=0.25 SENSITIVE Sensitive     GENTAMICIN <=0.5 SENSITIVE Sensitive  OXACILLIN 0.5 SENSITIVE Sensitive     TETRACYCLINE <=1 SENSITIVE Sensitive     VANCOMYCIN <=0.5 SENSITIVE Sensitive     TRIMETH/SULFA <=10 SENSITIVE Sensitive     CLINDAMYCIN <=0.25 SENSITIVE Sensitive     RIFAMPIN <=0.5 SENSITIVE Sensitive     Inducible Clindamycin NEGATIVE Sensitive     * STAPHYLOCOCCUS AUREUS   Staphylococcus epidermidis - MIC*    CIPROFLOXACIN <=0.5 SENSITIVE Sensitive     ERYTHROMYCIN <=0.25 SENSITIVE Sensitive     GENTAMICIN <=0.5 SENSITIVE Sensitive     OXACILLIN >=4 RESISTANT Resistant     TETRACYCLINE <=1 SENSITIVE Sensitive     VANCOMYCIN 1 SENSITIVE Sensitive     TRIMETH/SULFA <=10 SENSITIVE Sensitive     CLINDAMYCIN <=0.25 SENSITIVE Sensitive     RIFAMPIN <=0.5  SENSITIVE Sensitive     Inducible Clindamycin NEGATIVE Sensitive     * STAPHYLOCOCCUS EPIDERMIDIS  Blood Culture ID Panel (Reflexed)     Status: Abnormal   Collection Time: 12-23-20  2:31 PM  Result Value Ref Range Status   Enterococcus faecalis NOT DETECTED NOT DETECTED Final   Enterococcus Faecium NOT DETECTED NOT DETECTED Final   Listeria monocytogenes NOT DETECTED NOT DETECTED Final   Staphylococcus species DETECTED (A) NOT DETECTED Final    Comment: CRITICAL RESULT CALLED TO, READ BACK BY AND VERIFIED WITH: PHARMD J.FRENS AT 0818 ON 12/09/2020 BY T.SAAD.    Staphylococcus aureus (BCID) DETECTED (A) NOT DETECTED Final    Comment: CRITICAL RESULT CALLED TO, READ BACK BY AND VERIFIED WITH: PHARMD J.FRENS AT 0818 ON 12/09/2020 BY T.SAAD.    Staphylococcus epidermidis DETECTED (A) NOT DETECTED Final    Comment: CRITICAL RESULT CALLED TO, READ BACK BY AND VERIFIED WITH: PHARMD J.FRENS AT 0818 ON 12/09/2020 BY T.SAAD.    Staphylococcus lugdunensis NOT DETECTED NOT DETECTED Final   Streptococcus species NOT DETECTED NOT DETECTED Final   Streptococcus agalactiae NOT DETECTED NOT DETECTED Final   Streptococcus pneumoniae NOT DETECTED NOT DETECTED Final   Streptococcus pyogenes NOT DETECTED NOT DETECTED Final   A.calcoaceticus-baumannii NOT DETECTED NOT DETECTED Final   Bacteroides fragilis NOT DETECTED NOT DETECTED Final   Enterobacterales NOT DETECTED NOT DETECTED Final   Enterobacter cloacae complex NOT DETECTED NOT DETECTED Final   Escherichia coli NOT DETECTED NOT DETECTED Final   Klebsiella aerogenes NOT DETECTED NOT DETECTED Final   Klebsiella oxytoca NOT DETECTED NOT DETECTED Final   Klebsiella pneumoniae NOT DETECTED NOT DETECTED Final   Proteus species NOT DETECTED NOT DETECTED Final   Salmonella species NOT DETECTED NOT DETECTED Final   Serratia marcescens NOT DETECTED NOT DETECTED Final   Haemophilus influenzae NOT DETECTED NOT DETECTED Final   Neisseria meningitidis NOT  DETECTED NOT DETECTED Final   Pseudomonas aeruginosa NOT DETECTED NOT DETECTED Final   Stenotrophomonas maltophilia NOT DETECTED NOT DETECTED Final   Candida albicans NOT DETECTED NOT DETECTED Final   Candida auris NOT DETECTED NOT DETECTED Final   Candida glabrata NOT DETECTED NOT DETECTED Final   Candida krusei NOT DETECTED NOT DETECTED Final   Candida parapsilosis NOT DETECTED NOT DETECTED Final   Candida tropicalis NOT DETECTED NOT DETECTED Final   Cryptococcus neoformans/gattii NOT DETECTED NOT DETECTED Final   Methicillin resistance mecA/C DETECTED (A) NOT DETECTED Final    Comment: CRITICAL RESULT CALLED TO, READ BACK BY AND VERIFIED WITH: PHARMD J.FRENS AT 0818 ON 12/09/2020 BY T.SAAD.    Meth resistant mecA/C and MREJ NOT DETECTED NOT DETECTED Final    Comment: Performed at [facility],  [address] [place], [place] [address]  Resp Panel by RT-PCR (Flu A&B, Covid) Nasopharyngeal Swab     Status: None   Collection Time: Dec 09, 2020  5:38 PM   Specimen: Nasopharyngeal Swab; Nasopharyngeal(NP) swabs in vial transport medium  Result Value Ref Range Status   SARS Coronavirus 2 by RT PCR NEGATIVE NEGATIVE Final    Comment: (NOTE) SARS-CoV-2 target nucleic acids are NOT DETECTED.  The SARS-CoV-2 RNA is generally detectable in upper respiratory specimens during the acute phase of infection. The lowest concentration of SARS-CoV-2 viral copies this assay can detect is 138 copies/mL. A negative result does not preclude SARS-Cov-2 infection and should not be used as the sole basis for treatment or other patient management decisions. A negative result may occur with  improper specimen collection/handling, submission of specimen other than nasopharyngeal swab, presence of viral mutation(s) within the areas targeted by this assay, and inadequate number of viral copies(<138 copies/mL). A negative result must be combined with clinical observations, patient history, and  epidemiological information. The expected result is Negative.  Fact Sheet for Patients:  BloggerCourse.com  Fact Sheet for Healthcare Providers:  SeriousBroker.it  This test is no t yet approved or cleared by the [place] FDA and  has been authorized for detection and/or diagnosis of SARS-CoV-2 by FDA under an Emergency Use Authorization (EUA). This EUA will remain  in effect (meaning this test can be used) for the duration of the COVID-19 declaration under Section 564(b)(1) of the Act, 21 U.S.C.section 360bbb-3(b)(1), unless the authorization is terminated  or revoked sooner.       Influenza A by PCR NEGATIVE NEGATIVE Final   Influenza B by PCR NEGATIVE NEGATIVE Final    Comment: (NOTE) The Xpert Xpress SARS-CoV-2/FLU/RSV plus assay is intended as an aid in the diagnosis of influenza from Nasopharyngeal swab specimens and should not be used as a sole basis for treatment. Nasal washings and aspirates are unacceptable for Xpert Xpress SARS-CoV-2/FLU/RSV testing.  Fact Sheet for Patients: BloggerCourse.com  Fact Sheet for Healthcare Providers: SeriousBroker.it  This test is not yet approved or cleared by the [place] FDA and has been authorized for detection and/or diagnosis of SARS-CoV-2 by FDA under an Emergency Use Authorization (EUA). This EUA will remain in effect (meaning this test can be used) for the duration of the COVID-19 declaration under Section 564(b)(1) of the Act, 21 U.S.C. section 360bbb-3(b)(1), unless the authorization is terminated or revoked.  Performed at [facility], [address], [place], [place] [address]   MRSA Next Gen by PCR, Nasal     Status: None   Collection Time: 2020/12/09  7:45 PM   Specimen: Nasal Mucosa; Nasal Swab  Result Value Ref Range Status   MRSA by PCR Next Gen NOT DETECTED NOT DETECTED Final    Comment:  (NOTE) The GeneXpert MRSA Assay (FDA approved for NASAL specimens only), is one component of a comprehensive MRSA colonization surveillance program. It is not intended to diagnose MRSA infection nor to guide or monitor treatment for MRSA infections. Test performance is not FDA approved in patients less than 33 years old. Performed at [facility], [address], [place], [place] [address]   Culture, blood (routine x 2)     Status: None (Preliminary result)   Collection Time: 12/10/20  7:49 AM   Specimen: BLOOD RIGHT HAND  Result Value Ref Range Status   Specimen Description BLOOD RIGHT HAND  Final   Special Requests   Final  BOTTLES DRAWN AEROBIC ONLY Blood Culture adequate volume   Culture   Final    NO GROWTH 4 DAYS Performed at [facility], [address] [place], [place] [address]    Report Status PENDING  Incomplete  Culture, blood (routine x 2)     Status: None (Preliminary result)   Collection Time: 12/10/20  7:54 AM   Specimen: BLOOD RIGHT HAND  Result Value Ref Range Status   Specimen Description BLOOD RIGHT HAND  Final   Special Requests   Final    BOTTLES DRAWN AEROBIC ONLY Blood Culture adequate volume   Culture   Final    NO GROWTH 4 DAYS Performed at [facility], [address] [place], [place] [address]    Report Status PENDING  Incomplete  MRSA Next Gen by PCR, Nasal     Status: None   Collection Time: 12/11/20  9:26 AM   Specimen: Nasal Mucosa; Nasal Swab  Result Value Ref Range Status   MRSA by PCR Next Gen NOT DETECTED NOT DETECTED Final    Comment: (NOTE) The GeneXpert MRSA Assay (FDA approved for NASAL specimens only), is one component of a comprehensive MRSA colonization surveillance program. It is not intended to diagnose MRSA infection nor to guide or monitor treatment for MRSA infections. Test performance is not FDA approved in patients less than 68 years old. Performed at [facility], [address]  [place], [place] [address]     Impression/Plan:  1. Bacteremia - repeat blood cultures with ngtd.  TEE done yesterday and no vegetation noted.  Has grown Staph aureus, epidermidis and hominis.   Will need two weeks of treatment through July 8th.    2.  Respiratory failure - remains intubated and plans for extubation but copious secretions at this time.    3.  Leukocytosis - now normal today, improved.  Will continue to monitor.

## 2020-12-14 NOTE — Progress Notes (Addendum)
NAME:  Samuel [name], MRN:  [id number], DOB:  [birth year], LOS: 6 ADMISSION DATE:  11/17/2020, CONSULTATION DATE:  11/22/2020 CHIEF COMPLAINT:  DKA   History of Present Illness:  70 y/o M who presented to [facility] on 6/23 with N/V, obtunded. Unable to provide history or contact family initially. History obtained from chart review and per report.  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 He had 3 days of increased vomiting, per family.  Pertinent  Medical History  IDT2DM, CAD, HTN, h/o MRSA sepsis, h/o osteomyelitis, h/o PE, s/p left BKA  Significant Hospital Events: Including procedures, antibiotic start and stop dates in addition to other pertinent events   6/23 admitted with AMS, N/V 6/24 worsening secretion burden, intubated 6/27 oral/ETT bleeding, heparin infusion stopped.  Weaning 8/5.  6/28 Ongoing bloody secretions from ETT. TEE completed, negative for vegetation.    Interim History / Subjective:  Tmax 99.3 / WBC 8.8 Pt on PSV 10/5, tolerating well.  Ongoing thick secretions TEE negative for endocarditis  Glucose wnl - 70-85 I/O 1.5L UOP, 1.4+ in last 24 hours  Objective   Blood pressure (!) 137/57, pulse 68, temperature 99.32 F (37.4 C), resp. rate 16, height 6\' 3"  (1.905 m), weight 111.7 kg, SpO2 99 %.    Vent Mode: PSV;CPAP FiO2 (%):  [40 %-41 %] 40 % Set Rate:  [16 bmp] 16 bmp Vt Set:  [600 mL] 600 mL PEEP:  [5 cmH20] 5 cmH20 Pressure Support:  [10 cmH20] 10 cmH20 Plateau Pressure:  [16 cmH20-18 cmH20] 16 cmH20   Intake/Output Summary (Last 24 hours) at 12/14/2020 0902 Last data filed at 12/14/2020 0600 Gross per 24 hour  Intake 2753.75 ml  Output 1430 ml  Net 1323.75 ml   Filed Weights   12/12/20 0500 12/13/20 0500 12/14/20 0500  Weight: 109.2 kg 110.6 kg 111.7 kg    Examination: General:  chronically ill appearing adult male lying in bed on vent HEENT: MM pink/moist, ETT, anicteric Neuro: Awake, alert, watching TV, interacts appropriately, MAE CV: s1s2  RRR, no m/r/g PULM: non-labored on vent, lungs bilaterally coarse GI: soft, bsx4 active  Extremities: warm/dry, no edema, LLE BKA, 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 TEE 6/28 without vegetation. Additional component of volume depletion on admit with N/V. UA ordered on admit but not sent.  -follow repeat blood cultures from 6/25 -appreciate ID assistance with abx & cardiology for TEE -ICU monitoring   Acute Hypoxemic Respiratory Failure  OSA, noncompliant with CPAP  Resolving encephalopathy and thick secretions have been barrier to extubation.  -PRVC 8cc/kg as rest mode, D7 intubation -daily SBT / WUA with goal for extubation  -will need to clarify with family regarding plan for extubation if he fails / ?trach etc -add mucomyst x4 doses  -follow intermittent CXR   AKI  Mild Rhabdomyolysis  CK cleared, cr improved.  -Trend BMP / urinary output -add free water to aide in secretion clearance -Replace electrolytes as indicated -Avoid nephrotoxic agents, ensure adequate renal perfusion  DM with Hyperglycemia A1c 6/23 7.0, on  100 units insulin degludec at home -SSI, resistant scale  -TF coverage 8 units Q4  -lantus 35 units BID   AFib  CAD HTN Obesity  Converted with amio -tele monitoring  -continue heparin infusion, return to home eliquis when able   -hold home norvasc, plavix, ASA, lasix, lopressor, ranexa  Acute Metabolic Encephalopathy  Improving.  -limit sedating medications  -daily WUA  -promote PT efforts as able    Best practice:  Diet: TF Pain/Anxiety/Delirium protocol (if indicated): fent VAP protocol (if indicated): in place DVT prophylaxis: heparin gtt: on hold GI prophylaxis: PPI Glucose control: SSI Mobility: BR Code Status: full code  Family Communication: Brother, Nedra Hai, called for update 6/29. No answer, message left for return call. Disposition: ICU    Critical care time: 35 minutes    Canary Brim, MSN, APRN, NP-C, AGACNP-BC [facility] 12/14/2020, 9:11 AM   Please see [email or web address] for pager details.   From 7A-7P if no response, please call [phone] After hours, please call ELink [phone]

## 2020-12-14 NOTE — Progress Notes (Signed)
ANTICOAGULATION CONSULT NOTE  Pharmacy Consult for heparin Indication: atrial fibrillation  Allergies  Allergen Reactions   Gabapentin Hives, Itching, Nausea And Vomiting and Rash    REACTION: rash/hives (per patient, was a reaction to an inactive ingredient in another mgf brand). The patient stated that he does take this medication now and it doesn't cause a rash any more. LOW DOSE   Relafen [Nabumetone] Nausea And Vomiting and Rash   Codeine Nausea And Vomiting and Rash    Patient Measurements: Height: 6\' 3"  (190.5 cm) Weight: 110.6 kg (243 lb 13.3 oz) IBW/kg (Calculated) : 84.5 Heparin Dosing Weight: 105kg  Vital Signs: Temp: 100.04 F (37.8 C) (06/29 0400) Temp Source: Bladder (06/29 0400) BP: 116/52 (06/29 0400) Pulse Rate: 82 (06/29 0345)  Labs: Recent Labs    12/12/20 0434 12/13/20 0440 12/13/20 0541 12/13/20 2126 12/14/20 0353  HGB 10.8*  --  10.4*  --  9.3*  HCT 31.5*  --  30.4*  --  28.2*  PLT 151  --  139*  --  169  HEPARINUNFRC <0.10*  --   --  0.10* <0.10*  CREATININE 0.96 0.85  --   --  1.02  CKTOTAL 966* 319  --   --   --      Estimated Creatinine Clearance: 94.3 mL/min (by C-G formula based on SCr of 1.02 mg/dL).   Assessment: Pt was admitted for DKA. He has been on apixaban PTA for a hx PE? Last dose in the past week? Heparin has been ordered here for new afib. Baseline PTT 28, HL undetectable. Patient was on Heparin until 6/26 when started having bloody secretions per ETT. Improved to pink-tinged and heparin resumed.   Heparin level undetectable on gtt at 1500 units/hr. No issues with line reported per RN. Still with some pink-tinged secretions but not worsening.  Goal of Therapy:  Heparin level 0.3-0.7 units/ml Monitor platelets by anticoagulation protocol: Yes   Plan:  Increase heparin to 1800 units/hr  F/u 6 hr heparin level  7/26, PharmD, BCPS Please see amion for complete clinical pharmacist phone list 12/14/2020, 4:52 AM

## 2020-12-14 NOTE — Progress Notes (Addendum)
D/w pt's brother potential ability to extubate in next 24-48 hours.   I discussed with him that while we would expect him to do ok with liberation he does have quite a bit of thick secretions and due to his overall weakness and prolonged illness may have challenges clearing them.   I inquired about the pt's desire to be reintubated and potentially trach. Pt's brother endorsed that pt has previously and continues to express his wishes for a dnr and no long term vent support. He states that should his brother decompensate again or have cardiac arrest his wishes would be to pass with peace and dignity.   I have changed his code status to DNR We will cont sbt and hopefully extubate in next 24-28 hours after optimization without plans for reintubation but hopeful progression in care  Advanced planning critical care time: The patient is critically ill with multiple organ systems failure and requires high complexity decision making for assessment and support, frequent evaluation and titration of therapies, application of advanced monitoring technologies and extensive interpretation of multiple databases.  Critical care time 35 mins. This represents my time independent of the NPs time taking care of the pt. This is excluding procedures.    Briant Sites DO [facility] 12/14/2020, 10:30 AM See Amion for pager If no response to pager, please call [phone] until 1900 After 1900 please call [facility] [phone]

## 2020-12-15 ENCOUNTER — Inpatient Hospital Stay (HOSPITAL_COMMUNITY): Payer: HMO

## 2020-12-15 ENCOUNTER — Inpatient Hospital Stay: Payer: Self-pay

## 2020-12-15 DIAGNOSIS — R0902 Hypoxemia: Secondary | ICD-10-CM

## 2020-12-15 LAB — BASIC METABOLIC PANEL
Anion gap: 8 (ref 5–15)
BUN: 17 mg/dL (ref 8–23)
CO2: 25 mmol/L (ref 22–32)
Calcium: 7.7 mg/dL — ABNORMAL LOW (ref 8.9–10.3)
Chloride: 110 mmol/L (ref 98–111)
Creatinine, Ser: 0.9 mg/dL (ref 0.61–1.24)
GFR, Estimated: >60 mL/min (ref >60)
Glucose, Bld: 136 mg/dL — ABNORMAL HIGH (ref 70–99)
Potassium: 3.3 mmol/L — ABNORMAL LOW (ref 3.5–5.1)
Sodium: 143 mmol/L (ref 135–145)

## 2020-12-15 LAB — GLUCOSE, CAPILLARY
Glucose-Capillary: 144 mg/dL — ABNORMAL HIGH (ref 70–99)
Glucose-Capillary: 119 mg/dL — ABNORMAL HIGH (ref 70–99)
Glucose-Capillary: 158 mg/dL — ABNORMAL HIGH (ref 70–99)
Glucose-Capillary: 141 mg/dL — ABNORMAL HIGH (ref 70–99)
Glucose-Capillary: 136 mg/dL — ABNORMAL HIGH (ref 70–99)
Glucose-Capillary: 163 mg/dL — ABNORMAL HIGH (ref 70–99)

## 2020-12-15 LAB — PHOSPHORUS: Phosphorus: 2.9 mg/dL (ref 2.5–4.6)

## 2020-12-15 LAB — HEPARIN LEVEL (UNFRACTIONATED): Heparin Unfractionated: 0.36 IU/mL (ref 0.30–0.70)

## 2020-12-15 LAB — CULTURE, BLOOD (ROUTINE X 2)
Culture: NO GROWTH
Culture: NO GROWTH
Special Requests: ADEQUATE
Special Requests: ADEQUATE

## 2020-12-15 LAB — CBC
HCT: 29.5 % — ABNORMAL LOW (ref 39.0–52.0)
Hemoglobin: 9.6 g/dL — ABNORMAL LOW (ref 13.0–17.0)
MCH: 30.3 pg (ref 26.0–34.0)
MCHC: 32.5 g/dL (ref 30.0–36.0)
MCV: 93.1 fL (ref 80.0–100.0)
Platelets: 227 10*3/uL (ref 150–400)
RBC: 3.17 MIL/uL — ABNORMAL LOW (ref 4.22–5.81)
RDW: 16.1 % — ABNORMAL HIGH (ref 11.5–15.5)
WBC: 8.6 10*3/uL (ref 4.0–10.5)
nRBC: 0 % (ref 0.0–0.2)

## 2020-12-15 LAB — MAGNESIUM: Magnesium: 2 mg/dL (ref 1.7–2.4)

## 2020-12-15 MED ORDER — GUAIFENESIN 100 MG/5ML PO SOLN
10.0000 mL | Freq: Four times a day (QID) | ORAL | Status: DC
  Administered 2020-12-15 – 2020-12-17 (×10): 200 mg
  Filled 2020-12-15 (×3): qty 10
  Filled 2020-12-15: qty 5
  Filled 2020-12-15: qty 10
  Filled 2020-12-15: qty 5
  Filled 2020-12-15 (×2): qty 10
  Filled 2020-12-15 (×2): qty 5
  Filled 2020-12-15: qty 10

## 2020-12-15 MED ORDER — POTASSIUM CHLORIDE 20 MEQ PO PACK
20.0000 meq | PACK | ORAL | Status: AC
  Administered 2020-12-15 (×2): 20 meq
  Filled 2020-12-15 (×2): qty 1

## 2020-12-15 MED ORDER — POTASSIUM CHLORIDE 10 MEQ/50ML IV SOLN
10.0000 meq | INTRAVENOUS | Status: AC
  Administered 2020-12-15 (×4): 10 meq via INTRAVENOUS
  Filled 2020-12-15 (×4): qty 50

## 2020-12-15 MED ORDER — ENOXAPARIN SODIUM 100 MG/ML IJ SOSY
100.0000 mg | PREFILLED_SYRINGE | Freq: Two times a day (BID) | INTRAMUSCULAR | Status: DC
  Administered 2020-12-15 – 2020-12-17 (×6): 100 mg via SUBCUTANEOUS
  Filled 2020-12-15 (×6): qty 1

## 2020-12-15 NOTE — Progress Notes (Signed)
PICC unable to thread centrally despite multiple attempts. Patient was very agitated. Alexis, RN unit ask to sedate patient but unable to increase sedation. Due to above situation, procedure stopped. RN notified. PICC RN 12/16/20 will follow up.

## 2020-12-15 NOTE — Progress Notes (Signed)
Attempted to reach brother by phone for PICC consent.  No answer.  Will try again later today, Marijean Niemann, RN aware.

## 2020-12-15 NOTE — Progress Notes (Signed)
RT was called by RN stating that pt's SpO2 decreased to 87% with good waveform. RT informed RN to go ahead and place pt back on full support through the ventilator, RN understood. Pt tolerating well at this time with SVS. RT will continue to monitor pt.

## 2020-12-15 NOTE — Progress Notes (Addendum)
ANTICOAGULATION CONSULT NOTE  Pharmacy Consult for IV Heparin Indication: atrial fibrillation  Allergies  Allergen Reactions   Gabapentin Hives, Itching, Nausea And Vomiting and Rash    REACTION: rash/hives (per patient, was a reaction to an inactive ingredient in another mgf brand). The patient stated that he does take this medication now and it doesn't cause a rash any more. LOW DOSE   Relafen [Nabumetone] Nausea And Vomiting and Rash   Codeine Nausea And Vomiting and Rash    Patient Measurements: Height: 6\' 3"  (190.5 cm) Weight: 112.7 kg (248 lb 7.3 oz) IBW/kg (Calculated) : 84.5 Heparin Dosing Weight: 105 kg  Vital Signs: Temp: 99.5 F (37.5 C) (06/30 0700) Temp Source: Bladder (06/30 0400) BP: 127/56 (06/30 0700) Pulse Rate: 82 (06/30 0700)  Labs: Recent Labs     0000 12/13/20 0440 12/13/20 0541 12/13/20 2126 12/14/20 0353 12/14/20 1208 12/14/20 2038 12/15/20 0352 12/15/20 0440  HGB   < >  --  10.4*  --  9.3*  --   --  9.6*  --   HCT  --   --  30.4*  --  28.2*  --   --  29.5*  --   PLT  --   --  139*  --  169  --   --  227  --   HEPARINUNFRC  --   --   --    < > <0.10* 0.25* 0.32  --  0.36  CREATININE  --  0.85  --   --  1.02  --   --  0.90  --   CKTOTAL  --  319  --   --   --   --   --   --   --    < > = values in this interval not displayed.    Estimated Creatinine Clearance: 107.9 mL/min (by C-G formula based on SCr of 0.9 mg/dL).   Assessment: 70 yr old man admitted for DKA. He was on apixaban PTA for a hx PE? Last dose in the past week? Heparin has been ordered here for new afib. Baseline aPTT 28, heparin level undetectable. Patient was on IV Heparin until 6/26 when started having bloody secretions per ETT; improved to pink-tinged and heparin was resumed.   Heparin level level therapeutic x2 this AM at 0.36 on 2050units/ml. H/H up at 9.6/29.5 from yesterday.  Platelets are within normal limits. No issues with IV or bleeding observed or reported, no more  pink-tinged secretions.  Goal of Therapy:  Heparin level 0.3-0.7 units/ml Monitor platelets by anticoagulation protocol: Yes   Plan:  Continue heparin infusion to 2050 units/hr  Monitor daily heparin level, CBC Monitor for bleeding  05-04-1991, PharmD, BCPS, BCCCP Clinical Pharmacist Please refer to [facility] for [facility] numbers 12/15/2020, 7:15 AM   Addendum:  Discussed with Dr. 12/31/2020 regarding Lovenox therapy. Unable to resume Eliquis at this time due to Cortrak location being in the small intestine. BMI 31 noted - discussed dosing with Dr. Gaynell Face. Ok with rounding to 100mg  (~0.9mg /kg).   Plan: Lovenox 100 mg SQ every 12 hours.  Will plan LMWH level at Css and adjust dosing as needed.  Follow-up for oral access and ability to change back to Eliquis.   Gaynell Face, PharmD, BCPS, BCCCP Clinical Pharmacist Please refer to [facility] for [facility] Pharmacy numbers 12/15/2020, 9:05 AM

## 2020-12-15 NOTE — Progress Notes (Signed)
K 3.3 Electrolytes replaced per [facility] electrolyte replacement protocol

## 2020-12-15 NOTE — Progress Notes (Signed)
[facility]   Reason for visit: Follow up on bacteremia  Interval History: remains intubated.  Repeat blood cultures remain negative and final.  WBC wnl, remains afebrile.  No rash.  Day 8 total antibiotics Day 7 daptomycin  Physical Exam: Constitutional:  Vitals:   12/15/20 0800 12/15/20 0818  BP: (!) 115/51   Pulse: 95   Resp: 17   Temp: 99.68 F (37.6 C)   SpO2: 94% 96%   patient appears in NAD, alert Eyes: anicteric HENT: +ET Respiratory: respiratory effort on vent; CTA B Cardiovascular: RRR   Review of Systems: Unable to be assessed due to patient factors  Lab Results  Component Value Date   WBC 8.6 12/15/2020   HGB 9.6 (L) 12/15/2020   HCT 29.5 (L) 12/15/2020   MCV 93.1 12/15/2020   PLT 227 12/15/2020    Lab Results  Component Value Date   CREATININE 0.90 12/15/2020   BUN 17 12/15/2020   NA 143 12/15/2020   K 3.3 (L) 12/15/2020   CL 110 12/15/2020   CO2 25 12/15/2020    Lab Results  Component Value Date   ALT 18 12/04/2020   AST 29 12/10/2020   ALKPHOS 72 12/14/2020     Microbiology: Recent Results (from the past 240 hour(s))  Culture, blood (Routine x 2)     Status: Abnormal   Collection Time: 12/06/2020  2:03 PM   Specimen: BLOOD  Result Value Ref Range Status   Specimen Description BLOOD LEFT ANTECUBITAL  Final   Special Requests   Final    BOTTLES DRAWN AEROBIC AND ANAEROBIC Blood Culture results may not be optimal due to an inadequate volume of blood received in culture bottles   Culture  Setup Time   Final    GRAM POSITIVE COCCI IN CLUSTERS AEROBIC BOTTLE ONLY CRITICAL VALUE NOTED.  VALUE IS CONSISTENT WITH PREVIOUSLY REPORTED AND CALLED VALUE.    Culture (A)  Final    STAPHYLOCOCCUS HOMINIS STAPHYLOCOCCUS EPIDERMIDIS SUSCEPTIBILITIES PERFORMED ON PREVIOUS CULTURE WITHIN THE LAST 5 DAYS. THE SIGNIFICANCE OF ISOLATING THIS ORGANISM FROM A SINGLE SET OF BLOOD CULTURES WHEN MULTIPLE SETS ARE DRAWN IS  UNCERTAIN. PLEASE NOTIFY THE MICROBIOLOGY DEPARTMENT WITHIN ONE WEEK IF SPECIATION AND SENSITIVITIES ARE REQUIRED. FOR STAPH HOMINIS Performed at [facility], [address] [place], [place] [address]    Report Status 12/13/2020 FINAL  Final  Culture, blood (Routine x 2)     Status: Abnormal   Collection Time: 12/10/2020  2:31 PM   Specimen: BLOOD  Result Value Ref Range Status   Specimen Description BLOOD LEFT ANTECUBITAL  Final   Special Requests   Final    BOTTLES DRAWN AEROBIC AND ANAEROBIC Blood Culture adequate volume   Culture  Setup Time   Final    GRAM POSITIVE COCCI IN CLUSTERS IN BOTH AEROBIC AND ANAEROBIC BOTTLES CRITICAL RESULT CALLED TO, READ BACK BY AND VERIFIED WITH: PHARMD J.FRENS AT 0818 ON 12/09/2020 BY T.SAAD. Performed at [facility], [address] [place], [place] [address]    Culture (A)  Final    STAPHYLOCOCCUS AUREUS STAPHYLOCOCCUS EPIDERMIDIS    Report Status 12/12/2020 FINAL  Final   Organism ID, Bacteria STAPHYLOCOCCUS AUREUS  Final   Organism ID, Bacteria STAPHYLOCOCCUS EPIDERMIDIS  Final      Susceptibility   Staphylococcus aureus - MIC*    CIPROFLOXACIN <=0.5 SENSITIVE Sensitive     ERYTHROMYCIN <=0.25 SENSITIVE Sensitive     GENTAMICIN <=0.5 SENSITIVE  Sensitive     OXACILLIN 0.5 SENSITIVE Sensitive     TETRACYCLINE <=1 SENSITIVE Sensitive     VANCOMYCIN <=0.5 SENSITIVE Sensitive     TRIMETH/SULFA <=10 SENSITIVE Sensitive     CLINDAMYCIN <=0.25 SENSITIVE Sensitive     RIFAMPIN <=0.5 SENSITIVE Sensitive     Inducible Clindamycin NEGATIVE Sensitive     * STAPHYLOCOCCUS AUREUS   Staphylococcus epidermidis - MIC*    CIPROFLOXACIN <=0.5 SENSITIVE Sensitive     ERYTHROMYCIN <=0.25 SENSITIVE Sensitive     GENTAMICIN <=0.5 SENSITIVE Sensitive     OXACILLIN >=4 RESISTANT Resistant     TETRACYCLINE <=1 SENSITIVE Sensitive     VANCOMYCIN 1 SENSITIVE Sensitive     TRIMETH/SULFA <=10 SENSITIVE Sensitive     CLINDAMYCIN <=0.25 SENSITIVE  Sensitive     RIFAMPIN <=0.5 SENSITIVE Sensitive     Inducible Clindamycin NEGATIVE Sensitive     * STAPHYLOCOCCUS EPIDERMIDIS  Blood Culture ID Panel (Reflexed)     Status: Abnormal   Collection Time: December 29, 2020  2:31 PM  Result Value Ref Range Status   Enterococcus faecalis NOT DETECTED NOT DETECTED Final   Enterococcus Faecium NOT DETECTED NOT DETECTED Final   Listeria monocytogenes NOT DETECTED NOT DETECTED Final   Staphylococcus species DETECTED (A) NOT DETECTED Final    Comment: CRITICAL RESULT CALLED TO, READ BACK BY AND VERIFIED WITH: PHARMD J.FRENS AT 0818 ON 12/09/2020 BY T.SAAD.    Staphylococcus aureus (BCID) DETECTED (A) NOT DETECTED Final    Comment: CRITICAL RESULT CALLED TO, READ BACK BY AND VERIFIED WITH: PHARMD J.FRENS AT 0818 ON 12/09/2020 BY T.SAAD.    Staphylococcus epidermidis DETECTED (A) NOT DETECTED Final    Comment: CRITICAL RESULT CALLED TO, READ BACK BY AND VERIFIED WITH: PHARMD J.FRENS AT 0818 ON 12/09/2020 BY T.SAAD.    Staphylococcus lugdunensis NOT DETECTED NOT DETECTED Final   Streptococcus species NOT DETECTED NOT DETECTED Final   Streptococcus agalactiae NOT DETECTED NOT DETECTED Final   Streptococcus pneumoniae NOT DETECTED NOT DETECTED Final   Streptococcus pyogenes NOT DETECTED NOT DETECTED Final   A.calcoaceticus-baumannii NOT DETECTED NOT DETECTED Final   Bacteroides fragilis NOT DETECTED NOT DETECTED Final   Enterobacterales NOT DETECTED NOT DETECTED Final   Enterobacter cloacae complex NOT DETECTED NOT DETECTED Final   Escherichia coli NOT DETECTED NOT DETECTED Final   Klebsiella aerogenes NOT DETECTED NOT DETECTED Final   Klebsiella oxytoca NOT DETECTED NOT DETECTED Final   Klebsiella pneumoniae NOT DETECTED NOT DETECTED Final   Proteus species NOT DETECTED NOT DETECTED Final   Salmonella species NOT DETECTED NOT DETECTED Final   Serratia marcescens NOT DETECTED NOT DETECTED Final   Haemophilus influenzae NOT DETECTED NOT DETECTED Final    Neisseria meningitidis NOT DETECTED NOT DETECTED Final   Pseudomonas aeruginosa NOT DETECTED NOT DETECTED Final   Stenotrophomonas maltophilia NOT DETECTED NOT DETECTED Final   Candida albicans NOT DETECTED NOT DETECTED Final   Candida auris NOT DETECTED NOT DETECTED Final   Candida glabrata NOT DETECTED NOT DETECTED Final   Candida krusei NOT DETECTED NOT DETECTED Final   Candida parapsilosis NOT DETECTED NOT DETECTED Final   Candida tropicalis NOT DETECTED NOT DETECTED Final   Cryptococcus neoformans/gattii NOT DETECTED NOT DETECTED Final   Methicillin resistance mecA/C DETECTED (A) NOT DETECTED Final    Comment: CRITICAL RESULT CALLED TO, READ BACK BY AND VERIFIED WITH: PHARMD J.FRENS AT 0818 ON 12/09/2020 BY T.SAAD.    Meth resistant mecA/C and MREJ NOT DETECTED NOT DETECTED Final    Comment: Performed  at [facility], [address], [place], [place] [address]  Resp Panel by RT-PCR (Flu A&B, Covid) Nasopharyngeal Swab     Status: None   Collection Time: 12/15/2020  5:38 PM   Specimen: Nasopharyngeal Swab; Nasopharyngeal(NP) swabs in vial transport medium  Result Value Ref Range Status   SARS Coronavirus 2 by RT PCR NEGATIVE NEGATIVE Final    Comment: (NOTE) SARS-CoV-2 target nucleic acids are NOT DETECTED.  The SARS-CoV-2 RNA is generally detectable in upper respiratory specimens during the acute phase of infection. The lowest concentration of SARS-CoV-2 viral copies this assay can detect is 138 copies/mL. A negative result does not preclude SARS-Cov-2 infection and should not be used as the sole basis for treatment or other patient management decisions. A negative result may occur with  improper specimen collection/handling, submission of specimen other than nasopharyngeal swab, presence of viral mutation(s) within the areas targeted by this assay, and inadequate number of viral copies(<138 copies/mL). A negative result must be combined with clinical observations,  patient history, and epidemiological information. The expected result is Negative.  Fact Sheet for Patients:  BloggerCourse.com  Fact Sheet for Healthcare Providers:  SeriousBroker.it  This test is no t yet approved or cleared by the [place] FDA and  has been authorized for detection and/or diagnosis of SARS-CoV-2 by FDA under an Emergency Use Authorization (EUA). This EUA will remain  in effect (meaning this test can be used) for the duration of the COVID-19 declaration under Section 564(b)(1) of the Act, 21 U.S.C.section 360bbb-3(b)(1), unless the authorization is terminated  or revoked sooner.       Influenza A by PCR NEGATIVE NEGATIVE Final   Influenza B by PCR NEGATIVE NEGATIVE Final    Comment: (NOTE) The Xpert Xpress SARS-CoV-2/FLU/RSV plus assay is intended as an aid in the diagnosis of influenza from Nasopharyngeal swab specimens and should not be used as a sole basis for treatment. Nasal washings and aspirates are unacceptable for Xpert Xpress SARS-CoV-2/FLU/RSV testing.  Fact Sheet for Patients: BloggerCourse.com  Fact Sheet for Healthcare Providers: SeriousBroker.it  This test is not yet approved or cleared by the [place] FDA and has been authorized for detection and/or diagnosis of SARS-CoV-2 by FDA under an Emergency Use Authorization (EUA). This EUA will remain in effect (meaning this test can be used) for the duration of the COVID-19 declaration under Section 564(b)(1) of the Act, 21 U.S.C. section 360bbb-3(b)(1), unless the authorization is terminated or revoked.  Performed at [facility], [address], [place], [place] [address]   MRSA Next Gen by PCR, Nasal     Status: None   Collection Time: 12/04/2020  7:45 PM   Specimen: Nasal Mucosa; Nasal Swab  Result Value Ref Range Status   MRSA by PCR Next Gen NOT DETECTED NOT DETECTED Final     Comment: (NOTE) The GeneXpert MRSA Assay (FDA approved for NASAL specimens only), is one component of a comprehensive MRSA colonization surveillance program. It is not intended to diagnose MRSA infection nor to guide or monitor treatment for MRSA infections. Test performance is not FDA approved in patients less than 12 years old. Performed at [facility], [address] [place], [place] [address]   Culture, blood (routine x 2)     Status: None   Collection Time: 12/10/20  7:49 AM   Specimen: BLOOD RIGHT HAND  Result Value Ref Range Status   Specimen Description BLOOD RIGHT HAND  Final   Special Requests  Final    BOTTLES DRAWN AEROBIC ONLY Blood Culture adequate volume   Culture   Final    NO GROWTH 5 DAYS Performed at [facility], [address] [place], [place] [address]    Report Status 12/15/2020 FINAL  Final  Culture, blood (routine x 2)     Status: None   Collection Time: 12/10/20  7:54 AM   Specimen: BLOOD RIGHT HAND  Result Value Ref Range Status   Specimen Description BLOOD RIGHT HAND  Final   Special Requests   Final    BOTTLES DRAWN AEROBIC ONLY Blood Culture adequate volume   Culture   Final    NO GROWTH 5 DAYS Performed at [facility], [address] [place], [place] [address]    Report Status 12/15/2020 FINAL  Final  MRSA Next Gen by PCR, Nasal     Status: None   Collection Time: 12/11/20  9:26 AM   Specimen: Nasal Mucosa; Nasal Swab  Result Value Ref Range Status   MRSA by PCR Next Gen NOT DETECTED NOT DETECTED Final    Comment: (NOTE) The GeneXpert MRSA Assay (FDA approved for NASAL specimens only), is one component of a comprehensive MRSA colonization surveillance program. It is not intended to diagnose MRSA infection nor to guide or monitor treatment for MRSA infections. Test performance is not FDA approved in patients less than 68 years old. Performed at [facility], [address] [place], [place] [address]      Impression/Plan:  1. Bacteremia - multiple organisms including Staph aureus.  TEE negative for vegetation and repeat blood cultures remain negative.  On daptomycin and no changes Treatment through July 8th and can stop after that.   Can use oral linezolid at discharge if he leaves the hospital prior to 7/8.    2.  Aki - on daptomycin due to intial renal concerns but creat has normalized.  Will continue daptomycin regardless but can use vancomycin if there are any issues with daptomycin.   3.  Medication monitoring - CK had elevated mildly but repeat is wnl.  Will continue to monitor weekly on daptomycin.

## 2020-12-15 NOTE — Progress Notes (Signed)
   12/15/20 0818  Airway 7.5 mm  Placement Date/Time: 12/10/20 0045   Placed By: ICU physician  Laryngoscope Blade: 3  ETT Types: Endobronchial  Size (mm): 7.5 mm  Cuffed: Cuffed  Insertion attempts: 1  Airway Equipment: Video Laryngoscope  Placement Confirmation: Bilateral Breath So...  Secured at (cm) 26 cm  Measured From Lips  Secured Location Right  Secured By Actuary Repositioned Yes  Prone position No  Site Condition Dry  Adult Ventilator Settings  Vent Type Servo U  Humidity HME  Vent Mode CPAP;PSV  FiO2 (%) 40 %  Pressure Support 5 cmH20  PEEP 5 cmH20  Adult Ventilator Measurements  Peak Airway Pressure 11 L/min  Mean Airway Pressure 7 cmH20  Resp Rate Spontaneous 26 br/min  Resp Rate Total 26 br/min  Exhaled Vt 521 mL  Spont TV 521 mL  Measured Ve 12.8 mL  Auto PEEP 0 cmH20  Total PEEP 5 cmH20  SpO2 96 %  Adult Ventilator Alarms  Alarms On Y  Ve High Alarm 20 L/min  Ve Low Alarm 5 L/min  Resp Rate High Alarm 40 br/min  Resp Rate Low Alarm 10  PEEP Low Alarm 3 cmH2O  Press High Alarm 48 cmH2O  T Apnea 20 sec(s)  VAP Prevention  HME changed No  Ventilator changed No  Transported while on vent No  HOB> 30 Degrees Y  Daily Weaning Assessment  Daily Assessment of Readiness to Wean Wean protocol criteria met (SBT performed)  SBT Method CPAP 5 cm H20 and PS 5 cm H20  Weaning Start Time 0800  Breath Sounds  Bilateral Breath Sounds Clear;Diminished  Airway Suctioning/Secretions  Suction Type ETT  Suction Device  Catheter  Secretion Amount Moderate  Secretion Color Tan;White  Secretion Consistency Thick  Suction Tolerance Tolerated well  Suctioning Adverse Effects None

## 2020-12-15 NOTE — Progress Notes (Addendum)
NAME:  Samuel [name], MRN:  [id number], DOB:  1950/09/29, LOS: 7 ADMISSION DATE:  2020-12-16, CONSULTATION DATE:  12/16/2020 CHIEF COMPLAINT:  DKA   History of Present Illness:  70 y/o M who presented to [facility] on 6/23 with N/V, obtunded. Unable to provide history or contact family initially. History obtained from chart review and per report.  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 He had 3 days of increased vomiting, per family.  Pertinent  Medical History  IDT2DM, CAD, HTN, h/o MRSA sepsis, h/o osteomyelitis, h/o PE, s/p left BKA  Significant Hospital Events: Including procedures, antibiotic start and stop dates in addition to other pertinent events   6/23 admitted with AMS, N/V 6/24 worsening secretion burden, intubated 6/27 oral/ETT bleeding, heparin infusion stopped.  Weaning 8/5.  6/28 Ongoing bloody secretions from ETT. TEE completed, negative for vegetation.   Interim History / Subjective:  Bedside staff report improvement in secretions overnight. Tmax 99.5 Currently on PS 5/5   Objective   Blood pressure (!) 127/56, pulse 82, temperature 99.5 F (37.5 C), resp. rate 17, height 6\' 3"  (1.905 m), weight 112.7 kg, SpO2 97 %.    Vent Mode: PRVC FiO2 (%):  [40 %] 40 % Set Rate:  [16 bmp] 16 bmp Vt Set:  [600 mL] 600 mL PEEP:  [5 cmH20] 5 cmH20 Pressure Support:  [10 cmH20] 10 cmH20 Plateau Pressure:  [16 cmH20-25 cmH20] 25 cmH20   Intake/Output Summary (Last 24 hours) at 12/15/2020 0754 Last data filed at 12/15/2020 0700 Gross per 24 hour  Intake 3797.75 ml  Output 1451 ml  Net 2346.75 ml   Filed Weights   12/13/20 0500 12/14/20 0500 12/15/20 0500  Weight: 110.6 kg 111.7 kg 112.7 kg    Examination: General:  chronically ill appearing adult male lying in bed on vent HEENT: ETT, Small bore TF in place  Neuro: alert, oriented, follows commands  CV: RRR, no MRG  PULM: coarse breath sounds, no wheeze/crackles  GI: obese, soft, non-tender, active bowel sounds   Extremities: warm/dry, no edema, LLE BKA, RLE with multiple abrasions & great toe amputation, wrapped in gauze   Labs/imaging that I have personally reviewed  (right click and "Reselect all SmartList Selections" daily)  K 3.3. Crt 0.9, BUN 17. CO2 25   Resolved Hospital Problem list   DKA in patient with poorly controlled Type 2 DM  N/V  Septic & Hypovolemic Shock   Assessment & Plan:   Severe Sepsis secondary to RLE Cellulitis with MRSE/MSSA Bacteremia  ECHO w/o obvious endocarditis. TEE 6/28 without vegetation. Additional component of volume depletion on admit with N/V. UA ordered on admit but not sent. -TEE 6/28 no vegetation  Plan -repeat BC negative  -ID following  -ICU monitoring   Acute Hypoxemic Respiratory Failure  OSA, noncompliant with CPAP  Resolving encephalopathy and thick secretions have been barrier to extubation.  Plan -PRVC 8cc/kg as rest mode, D7 intubation  -daily SBT / WUA with goal for extubation  > improving secretions with strong cough >> patient lasted 10 hours 6/29, will continue as tolerated, plan to give one more day with possible extubation 7/1 -Adding scheduled Robitussin   -continue mucomyst x4 doses  -follow intermittent CXR   AKI >> resolved  Mild Rhabdomyolysis >> resolved  Plan -Trend BMP/urinary output -Continue Free Water  -Replace electrolytes as indicated -Avoid nephrotoxic agents, ensure adequate renal perfusion  DM with Hyperglycemia A1c 6/23 7.0, on 100 units insulin degludec at home Plan -SSI, resistant scale  -  TF coverage 8 units Q4  -lantus 35 units BID   AFib  CAD HTN Obesity  Converted with amio Plan -tele monitoring  -continue heparin infusion, return to home eliquis when able  >> change to lovenox -hold home norvasc, plavix, ASA, lasix, lopressor, ranexa  Acute Metabolic Encephalopathy  Improving.  Plan -limit sedating medications  -daily WUA  -promote PT efforts as able   Best practice:  Diet:  TF Pain/Anxiety/Delirium protocol (if indicated): fent VAP protocol (if indicated): in place DVT prophylaxis: heparin gtt GI prophylaxis: PPI Glucose control: SSI Mobility: BR Code Status: full code  Family Communication: Will update family Disposition: ICU    Critical care time: 32 minutes    Jovita Kussmaul, AGACNP-BC [facility]  PCCM Pgr: [phone]  Please see [email or web address] for pager details.   From 7A-7P if no response, please call [phone] After hours, please call ELink [phone]

## 2020-12-15 NOTE — Progress Notes (Signed)
   12/15/20 1158  Airway 7.5 mm  Placement Date/Time: 12/10/20 0045   Placed By: ICU physician  Laryngoscope Blade: 3  ETT Types: Endobronchial  Size (mm): 7.5 mm  Cuffed: Cuffed  Insertion attempts: 1  Airway Equipment: Video Laryngoscope  Placement Confirmation: Bilateral Breath So...  Secured at (cm) 26 cm  Measured From Lips  Secured Location Left  Secured By Brewing technologist Repositioned Yes  Prone position No  Cuff Pressure (cm H2O) Green OR 18-26 CmH2O  Site Condition Dry  Adult Ventilator Settings  Vent Type Servo U  Humidity HME  Vent Mode PRVC  Vt Set 600 mL  Set Rate 16 bmp  FiO2 (%) 80 %  I Time 0.9 Sec(s)  PEEP 5 cmH20  Adult Ventilator Measurements  Peak Airway Pressure 18 L/min  Mean Airway Pressure 12 cmH20  Plateau Pressure 23 cmH20  Resp Rate Spontaneous 28 br/min  Resp Rate Total 28 br/min  Exhaled Vt 604 mL  Spont TV 604 mL  Measured Ve 18.3 mL  I:E Ratio Measured 1:1.2  Auto PEEP 0 cmH20  Total PEEP 7.1 cmH20  SpO2 91 %  Adult Ventilator Alarms  Alarms On Y  Ve High Alarm 20 L/min  Ve Low Alarm 5 L/min  Resp Rate High Alarm 40 br/min  Resp Rate Low Alarm 10  PEEP Low Alarm 3 cmH2O  Press High Alarm 48 cmH2O  T Apnea 20 sec(s)  VAP Prevention  HME changed Yes  Ventilator changed No  Transported while on vent No  HOB> 30 Degrees Y  Equipment wiped down Yes  Daily Weaning Assessment  Patient response Failed SBT terminated  Reason SBT Terminated Excessive agitation, marked accessory muscle use, abdominal paradox, or diaphoresis noted;RR > 35 breaths/min or Frequency/TV ratio >105  Breath Sounds  Bilateral Breath Sounds Clear;Diminished

## 2020-12-16 ENCOUNTER — Inpatient Hospital Stay (HOSPITAL_COMMUNITY): Payer: HMO

## 2020-12-16 LAB — GLUCOSE, CAPILLARY
Glucose-Capillary: 152 mg/dL — ABNORMAL HIGH (ref 70–99)
Glucose-Capillary: 110 mg/dL — ABNORMAL HIGH (ref 70–99)
Glucose-Capillary: 138 mg/dL — ABNORMAL HIGH (ref 70–99)
Glucose-Capillary: 71 mg/dL (ref 70–99)
Glucose-Capillary: 142 mg/dL — ABNORMAL HIGH (ref 70–99)

## 2020-12-16 LAB — CBC
HCT: 27.4 % — ABNORMAL LOW (ref 39.0–52.0)
Hemoglobin: 8.7 g/dL — ABNORMAL LOW (ref 13.0–17.0)
MCH: 30.2 pg (ref 26.0–34.0)
MCHC: 31.8 g/dL (ref 30.0–36.0)
MCV: 95.1 fL (ref 80.0–100.0)
Platelets: 266 K/uL (ref 150–400)
RBC: 2.88 MIL/uL — ABNORMAL LOW (ref 4.22–5.81)
RDW: 15.6 % — ABNORMAL HIGH (ref 11.5–15.5)
WBC: 8.6 K/uL (ref 4.0–10.5)
nRBC: 0 % (ref 0.0–0.2)

## 2020-12-16 LAB — BASIC METABOLIC PANEL WITH GFR
Anion gap: 5 (ref 5–15)
BUN: 18 mg/dL (ref 8–23)
CO2: 27 mmol/L (ref 22–32)
Calcium: 7.6 mg/dL — ABNORMAL LOW (ref 8.9–10.3)
Chloride: 111 mmol/L (ref 98–111)
Creatinine, Ser: 1 mg/dL (ref 0.61–1.24)
GFR, Estimated: >60 mL/min
Glucose, Bld: 170 mg/dL — ABNORMAL HIGH (ref 70–99)
Potassium: 3.9 mmol/L (ref 3.5–5.1)
Sodium: 143 mmol/L (ref 135–145)

## 2020-12-16 LAB — PHOSPHORUS: Phosphorus: 3.3 mg/dL (ref 2.5–4.6)

## 2020-12-16 LAB — CK: Total CK: 70 U/L (ref 49–397)

## 2020-12-16 LAB — MAGNESIUM: Magnesium: 2.1 mg/dL (ref 1.7–2.4)

## 2020-12-16 NOTE — Progress Notes (Addendum)
NAME:  Samuel [name], MRN:  [id number], DOB:  March 25, 1951, LOS: 8 ADMISSION DATE:  11/19/2020, CONSULTATION DATE:  11/21/2020 CHIEF COMPLAINT:  DKA   History of Present Illness:  70 y/o M who presented to [facility] on 6/23 with N/V, obtunded. Unable to provide history or contact family initially. History obtained from chart review and per report.  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 He had 3 days of increased vomiting, per family.   Pertinent  Medical History  IDT2DM, CAD, HTN, h/o MRSA sepsis, h/o osteomyelitis, h/o PE, s/p left BKA  Significant Hospital Events: Including procedures, antibiotic start and stop dates in addition to other pertinent events   6/23 admitted with AMS, N/V 6/24 worsening secretion burden, intubated 6/27 oral/ETT bleeding, heparin infusion stopped.  Weaning 8/5.  6/28 Ongoing bloody secretions from ETT. TEE completed, negative for vegetation.   Interim History / Subjective:  Discussed with RN who reports no concern with patient. Working on weaning ventilator with hopes to extubate today or tomorrow. Secretions are improving. Patient continuously points to tube- he would like to be extubated. He is agreeable to continuing to wean ventilator prior to extubation and confirms DNR/DNI status for future events. Denies pain, nausea or vomiting.   Objective   Blood pressure 115/67, pulse 80, temperature (!) 97.4 F (36.3 C), temperature source Oral, resp. rate 19, height 6\' 3"  (1.905 m), weight 112.9 kg, SpO2 97 %.    Vent Mode: PSV;CPAP FiO2 (%):  [40 %-100 %] 40 % Set Rate:  [16 bmp-22 bmp] 16 bmp Vt Set:  [600 mL] 600 mL PEEP:  [5 cmH20] 5 cmH20 Pressure Support:  [10 cmH20] 10 cmH20 Plateau Pressure:  [18 cmH20-23 cmH20] 20 cmH20   Intake/Output Summary (Last 24 hours) at 12/16/2020 0854 Last data filed at 12/16/2020 0700 Gross per 24 hour  Intake 2229.76 ml  Output 760 ml  Net 1469.76 ml   Filed Weights   12/14/20 0500 12/15/20 0500 12/16/20 0500  Weight:  111.7 kg 112.7 kg 112.9 kg    Examination:  General:  chronically ill appearing adult male lying in bed on vent HEENT: ETT, Small bore TF in place, poor dentition Neuro: alert, oriented, follows commands, answers questions by giving thumbs up/down and nodding head  CV: RRR, no MRG  PULM: coarse breath sounds, no wheeze/crackles  GI: obese, soft, non-tender in all quadrants, active bowel sounds  Extremities: warm/dry, LLE BKA, right foot with chronic ischemic changes to foot, great toe amputation of right foot, right leg is wrapped in gauze, 2+ edema right foot to ankle  Labs/imaging that I have personally reviewed  (right click and "Reselect all SmartList Selections" daily)  Gluc 170, K 3.9, Hgb 8.7, CK 70 CXR: Stable support lines and tubes. Progressive pulmonary hypoinflation. Lung volumes are extremely small. Probable progressive bibasilar asymmetric pulmonary infiltrate.  Resolved Hospital Problem list   DKA in patient with poorly controlled Type 2 DM  N/V  AKI Rhabdomyolysis Septic & Hypovolemic Shock  Atrial Fibrillation   Assessment & Plan:  Severe Sepsis secondary to RLE Cellulitis with MRSE/MSSA Bacteremia  ECHO w/o obvious endocarditis. TEE 6/28 without vegetation. UA ordered on admit but not sent.  Currently on Day 8 of Daptomycin. Reassuringly, repeat blood culture 6/25 with no growth x5 days. Unsuccessful attempts to place PICC yesterday due to patient agitation. Plan -ID following   Plan for treatment through July 8th Transition to oral Linezolid at d/c if discharged before 7/8 -Weekly CK on Daptomycin  -  Re-trial PICC line placement today per IV team  Acute Hypoxemic Respiratory Failure  OSA, noncompliant with CPAP  Failed SBT yesterday due to excessive agitation and marked accessory muscle use, abdominal paradox and diaphoresis, per RT. Does not desire trach/re-intubation if necessary. Secretions are clearing, tolerating vent wean thus far. Hopeful for extubation  today (7/1) or tomorrow (7/2).  Plan -PRVC 8cc/kg as rest mode, D8 intubation -daily SBT / WUA with goal for extubation  -Continue Robitussin  -Follow intermittent CXR  -Renewing soft restraints as patient is at high-risk for self-extubation -Plan for extubation 7/1 or 7/2 if tolerates vent wean  AKI: Resolved  Mild Rhabdomyolysis: Resolved  Creatinine remains stable 0.9>1. Electrolytes stable. CK peak was 992 on 6/25, now improved to 70. Only 935 cc UOP yesterday.  Plan -Trend BMP/urinary output -Weekly CK while on Daptomycin (next draw 7/7) -Continue Free Water daily -Replace electrolytes as indicated -Avoid nephrotoxic agents, monitor BP to maintain adequate renal perfusion  DKA: Resolved T2DM with Hyperglycemia A1c 6/23 7.0, on 100 units insulin degludec at home. BG 110-170 in last 24 hours, on SSI.  Plan -CBG monitoring q4h -SSI, resistant scale  -TF coverage 8 units Q4  -Lantus 35 units BID   AFib  CAD HTN Obesity  Converted with Amio. BP ranging 87-126/41-76, not on anti-hypertensive medications. Plan -Tele monitoring  -Continue Lovenox -Hold home norvasc, plavix, ASA, lasix, lopressor, ranexa  Acute Metabolic Encephalopathy  Improving.  Plan -Limit sedating medications  -Daily WUA  -Promote PT efforts as able   Best practice:  Diet: TF, NPO Pain/Anxiety/Delirium protocol (if indicated): fent VAP protocol (if indicated): in place DVT prophylaxis: Lovenox GI prophylaxis: PPI Glucose control: Lantus, resistant SSI Mobility: BR Code Status: DNR  Family Communication: Will update family Disposition: ICU    Critical care time:    Sabino Dick PGY-2 Family Medicine   Please see [email or web address] for pager details.   From 7A-7P if no response, please call [phone] After hours, please call Pola Corn [phone]  -------------------------------  Attending note: I have seen and examined the patient. History, labs and imaging reviewed.  70 Y/O with  severe sepsis, MRSE, MSSA bacteremia, cellulitis, DKA, resp failure requiring intubation On Daptomycin per ID. Hospital stay complicated by A fib, on Amio Switched to lovenox from heparin infusion No tolerating PSV weans due to secretions.   Blood pressure 115/67, pulse 80, temperature (!) 97.4 F (36.3 C), temperature source Oral, resp. rate 19, height 6\' 3"  (1.905 m), weight 112.9 kg, SpO2 97 %. Gen:      No acute distress, chronically ill appearing HEENT:  EOMI, sclera anicteric, ETT Neck:     No masses; no thyromegaly Lungs:    Clear to auscultation bilaterally; normal respiratory effort CV:         Regular rate and rhythm; no murmurs Abd:      + bowel sounds; soft, non-tender; no palpable masses, no distension Ext:    No edema; adequate peripheral perfusion, Rt LE dressings Skin:      Warm and dry; no rash Neuro: Sedated  Labs/Imaging personally reviewed, significant for Labs are stable CXR shows low lung volumes  Assessment/plan: Severe sepsis, present on admission Secondary to cellulitis. Continue dapto per ID  Acute resp failure PSV weans as tolerated  AKI, Rhabo Improved, Monitor labs  DM On lantus  Afib Lovenox Amio PO  The patient is critically ill with multiple organ systems failure and requires high complexity decision making for assessment and support, frequent evaluation and titration of  therapies, application of advanced monitoring technologies and extensive interpretation of multiple databases.  Critical care time - 35 mins. This represents my time independent of the NPs time taking care of the pt.  Chilton Greathouse MD [facility] 12/16/2020, 10:29 AM

## 2020-12-16 NOTE — Progress Notes (Signed)
RT placed pt back on full support due to pt going to IR for procedure.

## 2020-12-16 NOTE — Progress Notes (Signed)
Pharmacy Antibiotic Note  Samuel [name] is a 70 y.o. male admitted on Jan 01, 2021 with suspected MSSA/MRSE bacteremia likely secondary to LE wounds.   Pharmacy has been consulted for daptomycin dosing Scr now trended back down to baseline of 1 from previous AKI. CK's have also trended down to 70- well within normal limits. Plan treatment with Daptomycin through 7/8.   Plan: Daptomycin 850 mg every 24 hours ( ~ 8 mg/kg)  CK weekly  Pharmacy will sign-off dapto consult with stabilized CK and end date in place  Height: 6\' 3"  (190.5 cm) Weight: 112.9 kg (248 lb 14.4 oz) IBW/kg (Calculated) : 84.5  Temp (24hrs), Avg:98.9 F (37.2 C), Min:97.4 F (36.3 C), Max:100.5 F (38.1 C)  Recent Labs  Lab 12/12/20 0434 12/13/20 0440 12/13/20 0541 12/14/20 0353 12/15/20 0352 12/16/20 0348  WBC 13.7*  --  11.0* 8.8 8.6 8.6  CREATININE 0.96 0.85  --  1.02 0.90 1.00     Estimated Creatinine Clearance: 97.2 mL/min (by C-G formula based on SCr of 1 mg/dL).    Allergies  Allergen Reactions   Gabapentin Hives, Itching, Nausea And Vomiting and Rash    REACTION: rash/hives (per patient, was a reaction to an inactive ingredient in another mgf brand). The patient stated that he does take this medication now and it doesn't cause a rash any more. LOW DOSE   Relafen [Nabumetone] Nausea And Vomiting and Rash   Codeine Nausea And Vomiting and Rash      Thank you for allowing pharmacy to be a part of this patient's care.  02/16/21, PharmD, BCPS, BCIDP Infectious Diseases Clinical Pharmacist Phone: [phone] 12/16/2020 8:13 AM

## 2020-12-16 NOTE — Progress Notes (Signed)
Change of plans, pt is not going to IR at this time. Pt was placed back on wean, pt tolerating well at this time with SVS.

## 2020-12-16 NOTE — Progress Notes (Signed)
[facility]   Reason for visit: Follow up on bacteremia  Interval History: remains intubated, WBC wnl, Tmax 100.2.  No acute events Day 9 total antibiotics Day 8 daptomycin  Physical Exam: Constitutional:  Vitals:   12/16/20 0700 12/16/20 0758  BP: (!) 115/57 115/67  Pulse: 79 80  Resp: 18 19  Temp: (!) 97.4 F (36.3 C)   SpO2: 100% 97%   patient appears in NAD Eyes: anicteric, eyes open HENT: + ET Respiratory:  respiratory effort on vent; CTA B Cardiovascular: RRR   Review of Systems: Unable to be assessed due to patient factors  Lab Results  Component Value Date   WBC 8.6 12/16/2020   HGB 8.7 (L) 12/16/2020   HCT 27.4 (L) 12/16/2020   MCV 95.1 12/16/2020   PLT 266 12/16/2020    Lab Results  Component Value Date   CREATININE 1.00 12/16/2020   BUN 18 12/16/2020   NA 143 12/16/2020   K 3.9 12/16/2020   CL 111 12/16/2020   CO2 27 12/16/2020    Lab Results  Component Value Date   ALT 18 11/24/2020   AST 29 11/30/2020   ALKPHOS 72 11/18/2020     Microbiology: Recent Results (from the past 240 hour(s))  Culture, blood (Routine x 2)     Status: Abnormal   Collection Time: 11/26/2020  2:03 PM   Specimen: BLOOD  Result Value Ref Range Status   Specimen Description BLOOD LEFT ANTECUBITAL  Final   Special Requests   Final    BOTTLES DRAWN AEROBIC AND ANAEROBIC Blood Culture results may not be optimal due to an inadequate volume of blood received in culture bottles   Culture  Setup Time   Final    GRAM POSITIVE COCCI IN CLUSTERS AEROBIC BOTTLE ONLY CRITICAL VALUE NOTED.  VALUE IS CONSISTENT WITH PREVIOUSLY REPORTED AND CALLED VALUE.    Culture (A)  Final    STAPHYLOCOCCUS HOMINIS STAPHYLOCOCCUS EPIDERMIDIS SUSCEPTIBILITIES PERFORMED ON PREVIOUS CULTURE WITHIN THE LAST 5 DAYS. THE SIGNIFICANCE OF ISOLATING THIS ORGANISM FROM A SINGLE SET OF BLOOD CULTURES WHEN MULTIPLE SETS ARE DRAWN IS UNCERTAIN. PLEASE NOTIFY THE MICROBIOLOGY  DEPARTMENT WITHIN ONE WEEK IF SPECIATION AND SENSITIVITIES ARE REQUIRED. FOR STAPH HOMINIS Performed at [facility], [address] [place], [place] [address]    Report Status 12/13/2020 FINAL  Final  Culture, blood (Routine x 2)     Status: Abnormal   Collection Time: 12/01/2020  2:31 PM   Specimen: BLOOD  Result Value Ref Range Status   Specimen Description BLOOD LEFT ANTECUBITAL  Final   Special Requests   Final    BOTTLES DRAWN AEROBIC AND ANAEROBIC Blood Culture adequate volume   Culture  Setup Time   Final    GRAM POSITIVE COCCI IN CLUSTERS IN BOTH AEROBIC AND ANAEROBIC BOTTLES CRITICAL RESULT CALLED TO, READ BACK BY AND VERIFIED WITH: PHARMD J.FRENS AT 0818 ON 12/09/2020 BY T.SAAD. Performed at [facility], [address] [place], [place] [address]    Culture (A)  Final    STAPHYLOCOCCUS AUREUS STAPHYLOCOCCUS EPIDERMIDIS    Report Status 12/12/2020 FINAL  Final   Organism ID, Bacteria STAPHYLOCOCCUS AUREUS  Final   Organism ID, Bacteria STAPHYLOCOCCUS EPIDERMIDIS  Final      Susceptibility   Staphylococcus aureus - MIC*    CIPROFLOXACIN <=0.5 SENSITIVE Sensitive     ERYTHROMYCIN <=0.25 SENSITIVE Sensitive     GENTAMICIN <=0.5 SENSITIVE Sensitive     OXACILLIN  0.5 SENSITIVE Sensitive     TETRACYCLINE <=1 SENSITIVE Sensitive     VANCOMYCIN <=0.5 SENSITIVE Sensitive     TRIMETH/SULFA <=10 SENSITIVE Sensitive     CLINDAMYCIN <=0.25 SENSITIVE Sensitive     RIFAMPIN <=0.5 SENSITIVE Sensitive     Inducible Clindamycin NEGATIVE Sensitive     * STAPHYLOCOCCUS AUREUS   Staphylococcus epidermidis - MIC*    CIPROFLOXACIN <=0.5 SENSITIVE Sensitive     ERYTHROMYCIN <=0.25 SENSITIVE Sensitive     GENTAMICIN <=0.5 SENSITIVE Sensitive     OXACILLIN >=4 RESISTANT Resistant     TETRACYCLINE <=1 SENSITIVE Sensitive     VANCOMYCIN 1 SENSITIVE Sensitive     TRIMETH/SULFA <=10 SENSITIVE Sensitive     CLINDAMYCIN <=0.25 SENSITIVE Sensitive     RIFAMPIN <=0.5 SENSITIVE  Sensitive     Inducible Clindamycin NEGATIVE Sensitive     * STAPHYLOCOCCUS EPIDERMIDIS  Blood Culture ID Panel (Reflexed)     Status: Abnormal   Collection Time: 12/05/2020  2:31 PM  Result Value Ref Range Status   Enterococcus faecalis NOT DETECTED NOT DETECTED Final   Enterococcus Faecium NOT DETECTED NOT DETECTED Final   Listeria monocytogenes NOT DETECTED NOT DETECTED Final   Staphylococcus species DETECTED (A) NOT DETECTED Final    Comment: CRITICAL RESULT CALLED TO, READ BACK BY AND VERIFIED WITH: PHARMD J.FRENS AT 0818 ON 12/09/2020 BY T.SAAD.    Staphylococcus aureus (BCID) DETECTED (A) NOT DETECTED Final    Comment: CRITICAL RESULT CALLED TO, READ BACK BY AND VERIFIED WITH: PHARMD J.FRENS AT 0818 ON 12/09/2020 BY T.SAAD.    Staphylococcus epidermidis DETECTED (A) NOT DETECTED Final    Comment: CRITICAL RESULT CALLED TO, READ BACK BY AND VERIFIED WITH: PHARMD J.FRENS AT 0818 ON 12/09/2020 BY T.SAAD.    Staphylococcus lugdunensis NOT DETECTED NOT DETECTED Final   Streptococcus species NOT DETECTED NOT DETECTED Final   Streptococcus agalactiae NOT DETECTED NOT DETECTED Final   Streptococcus pneumoniae NOT DETECTED NOT DETECTED Final   Streptococcus pyogenes NOT DETECTED NOT DETECTED Final   A.calcoaceticus-baumannii NOT DETECTED NOT DETECTED Final   Bacteroides fragilis NOT DETECTED NOT DETECTED Final   Enterobacterales NOT DETECTED NOT DETECTED Final   Enterobacter cloacae complex NOT DETECTED NOT DETECTED Final   Escherichia coli NOT DETECTED NOT DETECTED Final   Klebsiella aerogenes NOT DETECTED NOT DETECTED Final   Klebsiella oxytoca NOT DETECTED NOT DETECTED Final   Klebsiella pneumoniae NOT DETECTED NOT DETECTED Final   Proteus species NOT DETECTED NOT DETECTED Final   Salmonella species NOT DETECTED NOT DETECTED Final   Serratia marcescens NOT DETECTED NOT DETECTED Final   Haemophilus influenzae NOT DETECTED NOT DETECTED Final   Neisseria meningitidis NOT DETECTED  NOT DETECTED Final   Pseudomonas aeruginosa NOT DETECTED NOT DETECTED Final   Stenotrophomonas maltophilia NOT DETECTED NOT DETECTED Final   Candida albicans NOT DETECTED NOT DETECTED Final   Candida auris NOT DETECTED NOT DETECTED Final   Candida glabrata NOT DETECTED NOT DETECTED Final   Candida krusei NOT DETECTED NOT DETECTED Final   Candida parapsilosis NOT DETECTED NOT DETECTED Final   Candida tropicalis NOT DETECTED NOT DETECTED Final   Cryptococcus neoformans/gattii NOT DETECTED NOT DETECTED Final   Methicillin resistance mecA/C DETECTED (A) NOT DETECTED Final    Comment: CRITICAL RESULT CALLED TO, READ BACK BY AND VERIFIED WITH: PHARMD J.FRENS AT 0818 ON 12/09/2020 BY T.SAAD.    Meth resistant mecA/C and MREJ NOT DETECTED NOT DETECTED Final    Comment: Performed at [facility], [address] [place], [place] [address]  Resp Panel by RT-PCR (Flu A&B, Covid) Nasopharyngeal Swab     Status: None   Collection Time: 11/26/2020  5:38 PM   Specimen: Nasopharyngeal Swab; Nasopharyngeal(NP) swabs in vial transport medium  Result Value Ref Range Status   SARS Coronavirus 2 by RT PCR NEGATIVE NEGATIVE Final    Comment: (NOTE) SARS-CoV-2 target nucleic acids are NOT DETECTED.  The SARS-CoV-2 RNA is generally detectable in upper respiratory specimens during the acute phase of infection. The lowest concentration of SARS-CoV-2 viral copies this assay can detect is 138 copies/mL. A negative result does not preclude SARS-Cov-2 infection and should not be used as the sole basis for treatment or other patient management decisions. A negative result may occur with  improper specimen collection/handling, submission of specimen other than nasopharyngeal swab, presence of viral mutation(s) within the areas targeted by this assay, and inadequate number of viral copies(<138 copies/mL). A negative result must be combined with clinical observations, patient history, and  epidemiological information. The expected result is Negative.  Fact Sheet for Patients:  BloggerCourse.com  Fact Sheet for Healthcare Providers:  SeriousBroker.it  This test is no t yet approved or cleared by the [place] FDA and  has been authorized for detection and/or diagnosis of SARS-CoV-2 by FDA under an Emergency Use Authorization (EUA). This EUA will remain  in effect (meaning this test can be used) for the duration of the COVID-19 declaration under Section 564(b)(1) of the Act, 21 U.S.C.section 360bbb-3(b)(1), unless the authorization is terminated  or revoked sooner.       Influenza A by PCR NEGATIVE NEGATIVE Final   Influenza B by PCR NEGATIVE NEGATIVE Final    Comment: (NOTE) The Xpert Xpress SARS-CoV-2/FLU/RSV plus assay is intended as an aid in the diagnosis of influenza from Nasopharyngeal swab specimens and should not be used as a sole basis for treatment. Nasal washings and aspirates are unacceptable for Xpert Xpress SARS-CoV-2/FLU/RSV testing.  Fact Sheet for Patients: BloggerCourse.com  Fact Sheet for Healthcare Providers: SeriousBroker.it  This test is not yet approved or cleared by the [place] FDA and has been authorized for detection and/or diagnosis of SARS-CoV-2 by FDA under an Emergency Use Authorization (EUA). This EUA will remain in effect (meaning this test can be used) for the duration of the COVID-19 declaration under Section 564(b)(1) of the Act, 21 U.S.C. section 360bbb-3(b)(1), unless the authorization is terminated or revoked.  Performed at [facility], [address] [place], [place] [address]   MRSA Next Gen by PCR, Nasal     Status: None   Collection Time: 12/09/2020  7:45 PM   Specimen: Nasal Mucosa; Nasal Swab  Result Value Ref Range Status   MRSA by PCR Next Gen NOT DETECTED NOT DETECTED Final    Comment:  (NOTE) The GeneXpert MRSA Assay (FDA approved for NASAL specimens only), is one component of a comprehensive MRSA colonization surveillance program. It is not intended to diagnose MRSA infection nor to guide or monitor treatment for MRSA infections. Test performance is not FDA approved in patients less than 75 years old. Performed at [facility], [address], [place], [place] [address]   Culture, blood (routine x 2)     Status: None   Collection Time: 12/10/20  7:49 AM   Specimen: BLOOD RIGHT HAND  Result Value Ref Range Status   Specimen Description BLOOD RIGHT HAND  Final   Special Requests   Final    BOTTLES  DRAWN AEROBIC ONLY Blood Culture adequate volume   Culture   Final    NO GROWTH 5 DAYS Performed at [facility], [address], [place], [place] [address]    Report Status 12/15/2020 FINAL  Final  Culture, blood (routine x 2)     Status: None   Collection Time: 12/10/20  7:54 AM   Specimen: BLOOD RIGHT HAND  Result Value Ref Range Status   Specimen Description BLOOD RIGHT HAND  Final   Special Requests   Final    BOTTLES DRAWN AEROBIC ONLY Blood Culture adequate volume   Culture   Final    NO GROWTH 5 DAYS Performed at [facility], [address], [place], [place] [address]    Report Status 12/15/2020 FINAL  Final  MRSA Next Gen by PCR, Nasal     Status: None   Collection Time: 12/11/20  9:26 AM   Specimen: Nasal Mucosa; Nasal Swab  Result Value Ref Range Status   MRSA by PCR Next Gen NOT DETECTED NOT DETECTED Final    Comment: (NOTE) The GeneXpert MRSA Assay (FDA approved for NASAL specimens only), is one component of a comprehensive MRSA colonization surveillance program. It is not intended to diagnose MRSA infection nor to guide or monitor treatment for MRSA infections. Test performance is not FDA approved in patients less than 31 years old. Performed at [facility], [address] [place], [place] [address]      Impression/Plan:  1. Multi organism bacteremia - including Staph aureus and TEE negative for a vegetation.  Repeat blood cultures have remained negative.  Continue with daptomycin through July 8th and stop. If discharged prior to that, can use oral linezolid to complete through July 8th.   2.  Medication monitoring - will continue to check weekly CK.  I will sign off, call with questions

## 2020-12-16 DEATH — deceased

## 2020-12-17 ENCOUNTER — Inpatient Hospital Stay (HOSPITAL_COMMUNITY): Payer: HMO

## 2020-12-17 LAB — GLUCOSE, CAPILLARY
Glucose-Capillary: 188 mg/dL — ABNORMAL HIGH (ref 70–99)
Glucose-Capillary: 126 mg/dL — ABNORMAL HIGH (ref 70–99)
Glucose-Capillary: 85 mg/dL (ref 70–99)
Glucose-Capillary: 130 mg/dL — ABNORMAL HIGH (ref 70–99)
Glucose-Capillary: 125 mg/dL — ABNORMAL HIGH (ref 70–99)
Glucose-Capillary: 124 mg/dL — ABNORMAL HIGH (ref 70–99)

## 2020-12-17 MED ORDER — LORAZEPAM 2 MG/ML IJ SOLN
1.0000 mg | Freq: Once | INTRAMUSCULAR | Status: AC
  Administered 2020-12-17: 1 mg via INTRAVENOUS

## 2020-12-17 MED ORDER — FUROSEMIDE 10 MG/ML IJ SOLN
80.0000 mg | Freq: Once | INTRAMUSCULAR | Status: AC
  Administered 2020-12-17: 80 mg via INTRAVENOUS
  Filled 2020-12-17: qty 8

## 2020-12-17 MED ORDER — LORAZEPAM 2 MG/ML IJ SOLN
INTRAMUSCULAR | Status: AC
  Filled 2020-12-17: qty 1

## 2020-12-17 MED ORDER — FUROSEMIDE 10 MG/ML IJ SOLN
80.0000 mg | Freq: Three times a day (TID) | INTRAMUSCULAR | Status: DC
  Administered 2020-12-17 (×2): 80 mg via INTRAVENOUS
  Filled 2020-12-17 (×2): qty 8

## 2020-12-17 MED ORDER — MORPHINE SULFATE (PF) 2 MG/ML IV SOLN
2.0000 mg | INTRAVENOUS | Status: DC | PRN
  Administered 2020-12-17 (×2): 2 mg via INTRAVENOUS
  Filled 2020-12-17 (×2): qty 1

## 2020-12-17 MED ORDER — DEXMEDETOMIDINE HCL IN NACL 400 MCG/100ML IV SOLN
0.4000 ug/kg/h | INTRAVENOUS | Status: DC
  Administered 2020-12-17: 0.8 ug/kg/h via INTRAVENOUS
  Filled 2020-12-17: qty 100

## 2020-12-17 MED ORDER — LORAZEPAM 2 MG/ML IJ SOLN
2.0000 mg | Freq: Once | INTRAMUSCULAR | Status: AC
  Administered 2020-12-17: 2 mg via INTRAVENOUS
  Filled 2020-12-17: qty 1

## 2020-12-17 MED ORDER — FUROSEMIDE 10 MG/ML IJ SOLN: 40.0000 mg | Freq: Two times a day (BID) | INTRAMUSCULAR | Status: DC

## 2020-12-17 MED ORDER — ORAL CARE MOUTH RINSE
15.0000 mL | Freq: Two times a day (BID) | OROMUCOSAL | Status: DC
  Administered 2020-12-17: 15 mL via OROMUCOSAL

## 2020-12-17 MED ORDER — FENTANYL CITRATE (PF) 100 MCG/2ML IJ SOLN
50.0000 ug | Freq: Once | INTRAMUSCULAR | Status: AC
Start: 2020-12-18 — End: 2020-12-17
  Administered 2020-12-17: 50 ug via INTRAVENOUS
  Filled 2020-12-17: qty 2

## 2020-12-17 MED ORDER — MORPHINE 100MG IN NS 100ML (1MG/ML) PREMIX INFUSION: 1.0000 mg/h | INTRAVENOUS | Status: DC

## 2020-12-29 ENCOUNTER — Telehealth (HOSPITAL_COMMUNITY): Payer: Self-pay

## 2021-01-02 ENCOUNTER — Ambulatory Visit: Payer: HMO | Admitting: Family Medicine

## 2021-01-16 NOTE — Progress Notes (Signed)
Patient placed on bipap, but now refusing and pulling off bipap mask. MD order for heated high flow. Vitals are stable on 30L, 100%. RT will continue to monitor.

## 2021-01-16 NOTE — Procedures (Signed)
Extubation Procedure Note  Patient Details:   Name: Samuel [name] DOB: 1950/12/10 MRN: [id number]   Airway Documentation:    Vent end date: 01/07/2021 Vent end time: 1035   Evaluation  O2 sats: transiently fell during during procedure Complications: No apparent complications Patient did tolerate procedure well. Bilateral Breath Sounds: (P) Clear, Diminished   Yes  Patient extubated per MD order. Positive cuff leak. No stridor noted. Patient able to cough up thick secretions after. O2 sats 85% on 6L. Patient then placed on 10L salter with sats at 90%. RN at bedside. RT will continue to monitor.  Harmon Dun Latyra Jaye 01/06/2021, 10:41 AM

## 2021-01-16 NOTE — Progress Notes (Signed)
Patient anxious at this time. Will do CPT next round.

## 2021-01-16 NOTE — Progress Notes (Addendum)
PCCM progress note  Called to bedside after extubation.  Patient with mild respiratory distress, appears anxious Bilateral rhonchi on auscultation  He is 14 L positive since admission We will give him Lasix 80 mg IV, start BiPAP Chest x-ray ordered.  We discussed CODE STATUS with patient.  He is alert and states that he does not want reintubation or CPR. Also discussed with his brother Nedra Hai who lives in [place]. He agrees with the code status. Nedra Hai wants his brother to be kept comfortable if he deteriorates  The patient is critically ill with multiple organ system failure and requires high complexity decision making for assessment and support, frequent evaluation and titration of therapies, advanced monitoring, review of radiographic studies and interpretation of complex data.   Additional cc time- 30 mins  Chilton Greathouse MD [facility] See Amion for pager  If no response to pager , please call [phone] until 7pm After 7:00 pm call Elink  [phone] 12/28/2020, 11:45 AM

## 2021-01-16 NOTE — Progress Notes (Signed)
Called to bedside due to worsening respiratory status.   Transition to comfort care.  Given Fentanyl 100mg  and ativan 1mg  for some tachypnea.  Morphine infusion ordered.  Discussed plan with brother , who agreed.

## 2021-01-16 NOTE — Progress Notes (Addendum)
NAME:  Samuel [name], MRN:  [id number], DOB:  Aug 26, 1950, LOS: 9 ADMISSION DATE:  01/06/2021, CONSULTATION DATE:  01/10/2021 CHIEF COMPLAINT:  DKA   History of Present Illness:  70 y/o M who presented to [facility] on 6/23 with N/V, obtunded. Unable to provide history or contact family initially. History obtained from chart review and per report.  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 He had 3 days of increased vomiting, per family.   Pertinent  Medical History  IDT2DM, CAD, HTN, h/o MRSA sepsis, h/o osteomyelitis, h/o PE, s/p left BKA  Significant Hospital Events: Including procedures, antibiotic start and stop dates in addition to other pertinent events   6/23 admitted with AMS, N/V 6/24 worsening secretion burden, intubated 6/27 oral/ETT bleeding, heparin infusion stopped.  Weaning 8/5.  6/28 Ongoing bloody secretions from ETT. TEE completed, negative for vegetation.  7/1 PSV weans  Interim History / Subjective:   On PSV weans.  No acute event overnight  Objective   Blood pressure 123/62, pulse 96, temperature 98.1 F (36.7 C), temperature source Oral, resp. rate (!) 23, height 6\' 3"  (1.905 m), weight 112.9 kg, SpO2 95 %.    Vent Mode: PSV;CPAP FiO2 (%):  [40 %-50 %] 40 % Set Rate:  [16 bmp] 16 bmp Vt Set:  [600 mL] 600 mL PEEP:  [5 cmH20] 5 cmH20 Pressure Support:  [8 cmH20-10 cmH20] 10 cmH20 Plateau Pressure:  [18 cmH20-24 cmH20] 24 cmH20   Intake/Output Summary (Last 24 hours) at 01/08/2021 02/17/2021 Last data filed at 01/08/2021 02/17/2021 Gross per 24 hour  Intake 897.34 ml  Output 1075 ml  Net -177.66 ml   Filed Weights   12/14/20 0500 12/15/20 0500 12/16/20 0500  Weight: 111.7 kg 112.7 kg 112.9 kg    Examination:  Gen:      No acute distress HEENT:  EOMI, sclera anicteric Neck:     No masses; no thyromegaly, ETT Lungs:    Clear to auscultation bilaterally; normal respiratory effort CV:         Regular rate and rhythm; no murmurs Abd:      + bowel sounds; soft, non-tender;  no palpable masses, no distension Ext:    Left BKA, right leg in bandages Skin:      Warm and dry; no rash Neuro: Awake, responsive  Labs/imaging that I have personally reviewed  (right click and "Reselect all SmartList Selections" daily)   BUN/creatinine 18/1, hemoglobin 8.7, platelets 266 No new imaging  Resolved Hospital Problem list   DKA in patient with poorly controlled Type 2 DM  N/V  AKI Rhabdomyolysis Septic & Hypovolemic Shock  Atrial Fibrillation  Acute Metabolic Encephalopathy   Assessment & Plan:  Severe Sepsis secondary to RLE Cellulitis with MRSE/MSSA Bacteremia  ECHO w/o obvious endocarditis. TEE 6/28 without vegetation. UA ordered on admit but not sent.  Currently on Day 8 of Daptomycin. Reassuringly, repeat blood culture 6/25 with no growth x5 days. Unsuccessful attempts to place PICC yesterday due to patient agitation. Plan -ID following   Plan for treatment through July 8th Transition to oral Linezolid at d/c if discharged before 7/8 -Weekly CK on Daptomycin  Holding off on PICC line with the holiday weekend.  Will need IR to be consulted early next week if still needed Continue central line for now  Acute Hypoxemic Respiratory Failure  OSA, noncompliant with CPAP  Does not desire trach/re-intubation if necessary. Secretions are clearing, tolerating vent wean thus far.  Plan Pressure support weans as tolerated  Looks ready for extubation  AKI: Resolved  Mild Rhabdomyolysis: Resolved  Creatinine remains stable 0.9>1. Electrolytes stable. CK peak was 992 on 6/25, now improved to 70. Only 935 cc UOP yesterday.  Plan -Trend BMP/urinary output -Weekly CK while on Daptomycin (next draw 7/7) -Continue Free Water daily -Replace electrolytes as indicated -Avoid nephrotoxic agents, monitor BP to maintain adequate renal perfusion  DKA: Resolved T2DM with Hyperglycemia A1c 6/23 7.0, on 100 units insulin degludec at home. BG 110-170 in last 24 hours, on SSI.   Plan -CBG monitoring q4h -SSI, resistant scale  -TF coverage 8 units Q4  -Lantus 35 units BID   AFib  CAD HTN Obesity  Converted with Amio. BP ranging 87-126/41-76, not on anti-hypertensive medications. Plan -Tele monitoring  -Continue Lovenox -Hold home norvasc, plavix, ASA, lasix, lopressor, ranexa  Best practice:  Diet: TF, NPO Pain/Anxiety/Delirium protocol (if indicated): fent VAP protocol (if indicated): in place DVT prophylaxis: Lovenox GI prophylaxis: PPI Glucose control: Lantus, resistant SSI Mobility: BR Code Status: DNR  Family Communication:  Disposition: ICU    Critical care time:    The patient is critically ill with multiple organ system failure and requires high complexity decision making for assessment and support, frequent evaluation and titration of therapies, advanced monitoring, review of radiographic studies and interpretation of complex data.   Critical Care Time devoted to patient care services, exclusive of separately billable procedures, described in this note is 45 minutes.   Chilton Greathouse MD [facility] See Amion for pager  If no response to pager , please call [phone] until 7pm After 7:00 pm call Elink  [phone] 19-Dec-2020, 9:33 AM

## 2021-01-16 NOTE — Death Summary Note (Signed)
  DEATH SUMMARY   Patient Details  Name: MACLIN GUERRETTE MRN: [id number] DOB: 12/17/1950  Admission/Discharge Information   Admit Date:  12/24/2020  Date of Death: Date of Death: 01/02/21  Time of Death: Time of Death: Oct 11, 2326  Length of Stay: 10-12-22  Referring Physician: Mliss Sax, MD   Reason(s) for Hospitalization  Sepsis present on admission Septic shock Acute metabolic encephalopathy Diabetes ketoacidosis  Diagnoses  Preliminary cause of death:  Severe Sepsis, septic shock secondary to RLE Cellulitis with MRSE/MSSA Bacteremia DKA Acute hypoxic respiratory failure   Secondary Diagnoses (including complications and co-morbidities):  Active Problems:   Severe sepsis with septic shock (HCC)   DKA (diabetic ketoacidosis) (HCC)   Acute respiratory failure with hypoxia (HCC)   Pressure injury of skin   Staphylococcus aureus bacteremia Atrial fibrillation  Brief Hospital Course (including significant findings, care, treatment, and services provided and events leading to death)  GERALD KUEHL is a 70 y.o. year old male who  presented on 25-Dec-2022 with N/V, obtunded.  Found to be in DKA with MRSE, MSSA bacteremia, severe sepsis with septic shock.  Treated with insulin, antibiotics.  His mental status and respiratory status continued to deteriorate and was intubated on 6/25.  He was evaluated by infectious disease, no endocarditis on transesophageal echocardiogram  After discussion with brother and patient CODE STATUS was changed to DNR.  He was eventually extubated on 2023/01/03 after successfully passing weaning trials on the ventilator.  Post extubation his respiratory status declined and he started desaturating.  CODE STATUS was again addressed with the patient.  He was awake, alert and confirmed DNR status.  We also discussed with his brother Nedra Hai who lives in [place]. He agrees with the code status. Nedra Hai wanted his brother to be kept comfortable if he deteriorates.  BiPAP was attempted  but patient refused to keep mask on.  He was placed on nonrebreather but continued to deteriorate through the night and eventually transition to full comfort measures after consultation with family.  Signature:   Chilton Greathouse MD [facility] 12/23/2020, 2:45 PM

## 2021-01-16 DEATH — deceased
